# Patient Record
Sex: Female | Born: 1970
Health system: Southern US, Community
[De-identification: ages and names within clinical notes are randomized; demographics above are authoritative.]

## PROBLEM LIST (undated history)

## (undated) DIAGNOSIS — N186 End stage renal disease: Secondary | ICD-10-CM

## (undated) DIAGNOSIS — H409 Unspecified glaucoma: Secondary | ICD-10-CM

## (undated) DIAGNOSIS — J449 Chronic obstructive pulmonary disease, unspecified: Secondary | ICD-10-CM

## (undated) DIAGNOSIS — I5030 Unspecified diastolic (congestive) heart failure: Secondary | ICD-10-CM

## (undated) DIAGNOSIS — IMO0001 Reserved for inherently not codable concepts without codable children: Secondary | ICD-10-CM

## (undated) DIAGNOSIS — K529 Noninfective gastroenteritis and colitis, unspecified: Secondary | ICD-10-CM

## (undated) DIAGNOSIS — K810 Acute cholecystitis: Secondary | ICD-10-CM

## (undated) DIAGNOSIS — F32A Depression, unspecified: Secondary | ICD-10-CM

## (undated) DIAGNOSIS — I10 Essential (primary) hypertension: Secondary | ICD-10-CM

## (undated) DIAGNOSIS — Z8701 Personal history of pneumonia (recurrent): Secondary | ICD-10-CM

## (undated) DIAGNOSIS — H269 Unspecified cataract: Secondary | ICD-10-CM

## (undated) DIAGNOSIS — F419 Anxiety disorder, unspecified: Secondary | ICD-10-CM

## (undated) DIAGNOSIS — Z9289 Personal history of other medical treatment: Secondary | ICD-10-CM

## (undated) DIAGNOSIS — E11319 Type 2 diabetes mellitus with unspecified diabetic retinopathy without macular edema: Secondary | ICD-10-CM

## (undated) DIAGNOSIS — K219 Gastro-esophageal reflux disease without esophagitis: Secondary | ICD-10-CM

## (undated) DIAGNOSIS — Z794 Long term (current) use of insulin: Secondary | ICD-10-CM

## (undated) DIAGNOSIS — F329 Major depressive disorder, single episode, unspecified: Secondary | ICD-10-CM

## (undated) DIAGNOSIS — G629 Polyneuropathy, unspecified: Secondary | ICD-10-CM

## (undated) DIAGNOSIS — H5440 Blindness, one eye, unspecified eye: Secondary | ICD-10-CM

## (undated) DIAGNOSIS — D219 Benign neoplasm of connective and other soft tissue, unspecified: Secondary | ICD-10-CM

## (undated) DIAGNOSIS — R109 Unspecified abdominal pain: Secondary | ICD-10-CM

## (undated) DIAGNOSIS — J45909 Unspecified asthma, uncomplicated: Secondary | ICD-10-CM

## (undated) DIAGNOSIS — G8929 Other chronic pain: Secondary | ICD-10-CM

## (undated) DIAGNOSIS — K7400 Hepatic fibrosis, unspecified: Secondary | ICD-10-CM

## (undated) DIAGNOSIS — J40 Bronchitis, not specified as acute or chronic: Secondary | ICD-10-CM

## (undated) DIAGNOSIS — D638 Anemia in other chronic diseases classified elsewhere: Secondary | ICD-10-CM

## (undated) DIAGNOSIS — Z992 Dependence on renal dialysis: Secondary | ICD-10-CM

## (undated) DIAGNOSIS — E11621 Type 2 diabetes mellitus with foot ulcer: Secondary | ICD-10-CM

## (undated) DIAGNOSIS — K760 Fatty (change of) liver, not elsewhere classified: Secondary | ICD-10-CM

## (undated) DIAGNOSIS — M869 Osteomyelitis, unspecified: Secondary | ICD-10-CM

## (undated) DIAGNOSIS — L97509 Non-pressure chronic ulcer of other part of unspecified foot with unspecified severity: Secondary | ICD-10-CM

## (undated) DIAGNOSIS — E785 Hyperlipidemia, unspecified: Secondary | ICD-10-CM

## (undated) HISTORY — DX: Non-pressure chronic ulcer of other part of unspecified foot with unspecified severity: L97.509

## (undated) HISTORY — DX: Personal history of pneumonia (recurrent): Z87.01

## (undated) HISTORY — DX: Hepatic fibrosis, unspecified: K74.00

## (undated) HISTORY — DX: Unspecified diastolic (congestive) heart failure: I50.30

## (undated) HISTORY — PX: TUBAL LIGATION: SHX77

## (undated) HISTORY — PX: CATARACT EXTRACTION W/ INTRAOCULAR LENS IMPLANT: SHX1309

## (undated) HISTORY — DX: Essential (primary) hypertension: I10

## (undated) HISTORY — PX: EYE SURGERY: SHX253

## (undated) HISTORY — DX: Type 2 diabetes mellitus with foot ulcer: E11.621

## (undated) HISTORY — DX: Dependence on renal dialysis: N18.6

## (undated) HISTORY — DX: End stage renal disease: Z99.2

---

## 2004-06-18 ENCOUNTER — Emergency Department (HOSPITAL_COMMUNITY): Admission: EM | Admit: 2004-06-18 | Discharge: 2004-06-18 | Payer: Self-pay | Admitting: Emergency Medicine

## 2004-06-20 ENCOUNTER — Emergency Department (HOSPITAL_COMMUNITY): Admission: EM | Admit: 2004-06-20 | Discharge: 2004-06-20 | Payer: Self-pay | Admitting: Emergency Medicine

## 2005-04-23 ENCOUNTER — Emergency Department (HOSPITAL_COMMUNITY): Admission: EM | Admit: 2005-04-23 | Discharge: 2005-04-23 | Payer: Self-pay | Admitting: Emergency Medicine

## 2005-04-23 ENCOUNTER — Emergency Department (HOSPITAL_COMMUNITY): Admission: EM | Admit: 2005-04-23 | Discharge: 2005-04-23 | Payer: Self-pay | Admitting: Family Medicine

## 2006-10-15 ENCOUNTER — Other Ambulatory Visit: Admission: RE | Admit: 2006-10-15 | Discharge: 2006-10-15 | Payer: Self-pay | Admitting: Unknown Physician Specialty

## 2006-10-15 ENCOUNTER — Encounter (INDEPENDENT_AMBULATORY_CARE_PROVIDER_SITE_OTHER): Payer: Self-pay | Admitting: Specialist

## 2006-10-16 ENCOUNTER — Encounter (INDEPENDENT_AMBULATORY_CARE_PROVIDER_SITE_OTHER): Payer: Self-pay | Admitting: Specialist

## 2007-11-02 ENCOUNTER — Emergency Department (HOSPITAL_COMMUNITY): Admission: EM | Admit: 2007-11-02 | Discharge: 2007-11-02 | Payer: Self-pay | Admitting: Emergency Medicine

## 2010-12-24 ENCOUNTER — Inpatient Hospital Stay (INDEPENDENT_AMBULATORY_CARE_PROVIDER_SITE_OTHER)
Admission: RE | Admit: 2010-12-24 | Discharge: 2010-12-24 | Disposition: A | Discharge status: Home / Self Care | Payer: Self-pay | Source: Ambulatory Visit | Attending: Emergency Medicine | Admitting: Emergency Medicine

## 2010-12-24 DIAGNOSIS — N760 Acute vaginitis: Secondary | ICD-10-CM

## 2010-12-24 LAB — WET PREP, GENITAL
Trich, Wet Prep: NONE SEEN
Yeast Wet Prep HPF POC: NONE SEEN

## 2010-12-24 LAB — POCT URINALYSIS DIP (DEVICE)
Bilirubin Urine: NEGATIVE
Ketones, ur: NEGATIVE mg/dL
Protein, ur: NEGATIVE mg/dL
Specific Gravity, Urine: <=1.005 (ref 1.005–1.030)
pH: 5.5 (ref 5.0–8.0)

## 2010-12-26 LAB — GC/CHLAMYDIA PROBE AMP, GENITAL
Chlamydia, DNA Probe: NEGATIVE
GC Probe Amp, Genital: NEGATIVE

## 2011-04-24 LAB — WOUND CULTURE

## 2011-09-17 ENCOUNTER — Encounter (HOSPITAL_COMMUNITY): Payer: Self-pay | Admitting: Emergency Medicine

## 2011-09-17 ENCOUNTER — Emergency Department (HOSPITAL_COMMUNITY)
Admission: EM | Admit: 2011-09-17 | Discharge: 2011-09-17 | Disposition: A | Discharge status: Home / Self Care | Payer: Self-pay | Attending: Emergency Medicine | Admitting: Emergency Medicine

## 2011-09-17 DIAGNOSIS — IMO0002 Reserved for concepts with insufficient information to code with codable children: Secondary | ICD-10-CM | POA: Insufficient documentation

## 2011-09-17 DIAGNOSIS — M5380 Other specified dorsopathies, site unspecified: Secondary | ICD-10-CM | POA: Insufficient documentation

## 2011-09-17 DIAGNOSIS — F17200 Nicotine dependence, unspecified, uncomplicated: Secondary | ICD-10-CM | POA: Insufficient documentation

## 2011-09-17 DIAGNOSIS — M549 Dorsalgia, unspecified: Secondary | ICD-10-CM | POA: Insufficient documentation

## 2011-09-17 DIAGNOSIS — M62830 Muscle spasm of back: Secondary | ICD-10-CM

## 2011-09-17 DIAGNOSIS — E119 Type 2 diabetes mellitus without complications: Secondary | ICD-10-CM | POA: Insufficient documentation

## 2011-09-17 MED ORDER — CYCLOBENZAPRINE HCL 10 MG PO TABS: 10.0000 mg | ORAL_TABLET | Freq: Two times a day (BID) | ORAL | Status: AC | PRN

## 2011-09-17 MED ORDER — IBUPROFEN 600 MG PO TABS: 600.0000 mg | ORAL_TABLET | Freq: Four times a day (QID) | ORAL | Status: AC | PRN

## 2011-09-17 MED ORDER — IBUPROFEN 800 MG PO TABS
800.0000 mg | ORAL_TABLET | Freq: Once | ORAL | Status: AC
  Administered 2011-09-17: 800 mg via ORAL
  Filled 2011-09-17: qty 1

## 2011-09-17 NOTE — Discharge Instructions (Signed)
Take your next dose of ibuprofen tomorrow morning.  Use the muscle relaxer as directed.   Avoid lifting,  Bending,  Twisting or any other activity that worsens your pain over the next week.  Apply an  icepack  to your lower back for 10-15 minutes every 2 hours for the next 2 days.  You should get rechecked if your symptoms are not better over the next 5 days,  Or you develop increased pain,  Weakness in your leg(s) or loss of bladder or bowel function - these are symptoms of a worse injury.

## 2011-09-17 NOTE — ED Notes (Signed)
Pt c/o lower back pain x 3 days.
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
## 2011-12-29 ENCOUNTER — Emergency Department (HOSPITAL_COMMUNITY): Payer: Self-pay

## 2011-12-29 ENCOUNTER — Encounter (HOSPITAL_COMMUNITY): Payer: Self-pay | Admitting: Emergency Medicine

## 2011-12-29 ENCOUNTER — Emergency Department (HOSPITAL_COMMUNITY)
Admission: EM | Admit: 2011-12-29 | Discharge: 2011-12-29 | Disposition: A | Discharge status: Home / Self Care | Payer: Self-pay | Attending: Emergency Medicine | Admitting: Emergency Medicine

## 2011-12-29 DIAGNOSIS — Z79899 Other long term (current) drug therapy: Secondary | ICD-10-CM | POA: Insufficient documentation

## 2011-12-29 DIAGNOSIS — E1169 Type 2 diabetes mellitus with other specified complication: Secondary | ICD-10-CM | POA: Insufficient documentation

## 2011-12-29 DIAGNOSIS — R739 Hyperglycemia, unspecified: Secondary | ICD-10-CM

## 2011-12-29 DIAGNOSIS — J0100 Acute maxillary sinusitis, unspecified: Secondary | ICD-10-CM | POA: Insufficient documentation

## 2011-12-29 LAB — DIFFERENTIAL
Basophils Absolute: 0 10*3/uL (ref 0.0–0.1)
Basophils Relative: 0 % (ref 0–1)
Eosinophils Relative: 1 % (ref 0–5)
Lymphocytes Relative: 23 % (ref 12–46)
Monocytes Absolute: 0.5 10*3/uL (ref 0.1–1.0)

## 2011-12-29 LAB — CBC
HCT: 37.5 % (ref 36.0–46.0)
MCH: 31 pg (ref 26.0–34.0)
MCHC: 34.9 g/dL (ref 30.0–36.0)
MCV: 88.9 fL (ref 78.0–100.0)
RDW: 12.2 % (ref 11.5–15.5)

## 2011-12-29 LAB — URINE MICROSCOPIC-ADD ON

## 2011-12-29 LAB — GLUCOSE, CAPILLARY: Glucose-Capillary: 241 mg/dL — ABNORMAL HIGH (ref 70–99)

## 2011-12-29 LAB — BASIC METABOLIC PANEL
CO2: 27 mEq/L (ref 19–32)
Calcium: 9.2 mg/dL (ref 8.4–10.5)
Creatinine, Ser: 0.58 mg/dL (ref 0.50–1.10)

## 2011-12-29 LAB — URINALYSIS, ROUTINE W REFLEX MICROSCOPIC
Glucose, UA: >1000 mg/dL — AB
Ketones, ur: NEGATIVE mg/dL
Leukocytes, UA: NEGATIVE
Protein, ur: 30 mg/dL — AB

## 2011-12-29 MED ORDER — SODIUM CHLORIDE 0.9 % IV SOLN: INTRAVENOUS | Status: DC

## 2011-12-29 MED ORDER — ONDANSETRON HCL 4 MG/2ML IJ SOLN
4.0000 mg | Freq: Once | INTRAMUSCULAR | Status: AC
  Administered 2011-12-29: 4 mg via INTRAVENOUS
  Filled 2011-12-29: qty 2

## 2011-12-29 MED ORDER — SODIUM CHLORIDE 0.9 % IV BOLUS (SEPSIS)
1000.0000 mL | Freq: Once | INTRAVENOUS | Status: AC
  Administered 2011-12-29: 1000 mL via INTRAVENOUS

## 2011-12-29 MED ORDER — METFORMIN HCL 500 MG PO TABS
1000.0000 mg | ORAL_TABLET | Freq: Once | ORAL | Status: AC
  Administered 2011-12-29: 1000 mg via ORAL
  Filled 2011-12-29: qty 2

## 2011-12-29 MED ORDER — INSULIN ASPART 100 UNIT/ML ~~LOC~~ SOLN
10.0000 [IU] | Freq: Once | SUBCUTANEOUS | Status: AC
  Administered 2011-12-29: 10 [IU] via SUBCUTANEOUS

## 2011-12-29 MED ORDER — ACETAMINOPHEN 500 MG PO TABS
1000.0000 mg | ORAL_TABLET | Freq: Once | ORAL | Status: AC
  Administered 2011-12-29: 1000 mg via ORAL
  Filled 2011-12-29: qty 2

## 2011-12-29 MED ORDER — INSULIN REGULAR HUMAN 100 UNIT/ML IJ SOLN: 10.0000 [IU] | Freq: Once | INTRAMUSCULAR | Status: DC

## 2011-12-29 MED ORDER — AMOXICILLIN-POT CLAVULANATE 875-125 MG PO TABS
1.0000 | ORAL_TABLET | Freq: Once | ORAL | Status: AC
  Administered 2011-12-29: 1 via ORAL
  Filled 2011-12-29: qty 1

## 2011-12-29 MED ORDER — IBUPROFEN 600 MG PO TABS: 600.0000 mg | ORAL_TABLET | Freq: Four times a day (QID) | ORAL | Status: AC | PRN

## 2011-12-29 MED ORDER — AMOXICILLIN-POT CLAVULANATE 875-125 MG PO TABS: 1.0000 | ORAL_TABLET | Freq: Two times a day (BID) | ORAL | Status: AC

## 2011-12-29 NOTE — Discharge Instructions (Signed)
Hyperglycemia Hyperglycemia occurs when the glucose (sugar) in your blood is too high. Hyperglycemia can happen for many reasons, but it most often happens to people who do not know they have diabetes or are not managing their diabetes properly.  CAUSES  Whether you have diabetes or not, there are other causes of hyperglycemia. Hyperglycemia can occur when you have diabetes, but it can also occur in other situations that you might not be as aware of, such as: Diabetes  If you have diabetes and are having problems controlling your blood glucose, hyperglycemia could occur because of some of the following reasons:   Not following your meal plan.   Not taking your diabetes medications or not taking it properly.   Exercising less or doing less activity than you normally do.   Being sick.  Pre-diabetes  This cannot be ignored. Before people develop Type 2 diabetes, they almost always have "pre-diabetes." This is when your blood glucose levels are higher than normal, but not yet high enough to be diagnosed as diabetes. Research has shown that some long-term damage to the body, especially the heart and circulatory system, may already be occurring during pre-diabetes. If you take action to manage your blood glucose when you have pre-diabetes, you may delay or prevent Type 2 diabetes from developing.  Stress  If you have diabetes, you may be "diet" controlled or on oral medications or insulin to control your diabetes. However, you may find that your blood glucose is higher than usual in the hospital whether you have diabetes or not. This is often referred to as "stress hyperglycemia." Stress can elevate your blood glucose. This happens because of hormones put out by the body during times of stress. If stress has been the cause of your high blood glucose, it can be followed regularly by your caregiver. That way he/she can make sure your hyperglycemia does not continue to get worse or progress to diabetes.    Steroids  Steroids are medications that act on the infection fighting system (immune system) to block inflammation or infection. One side effect can be a rise in blood glucose. Most people can produce enough extra insulin to allow for this rise, but for those who cannot, steroids make blood glucose levels go even higher. It is not unusual for steroid treatments to "uncover" diabetes that is developing. It is not always possible to determine if the hyperglycemia will go away after the steroids are stopped. A special blood test called an A1c is sometimes done to determine if your blood glucose was elevated before the steroids were started.  SYMPTOMS  Thirsty.   Frequent urination.   Dry mouth.   Blurred vision.   Tired or fatigue.   Weakness.   Sleepy.   Tingling in feet or leg.  DIAGNOSIS  Diagnosis is made by monitoring blood glucose in one or all of the following ways:  A1c test. This is a chemical found in your blood.   Fingerstick blood glucose monitoring.   Laboratory results.  TREATMENT  First, knowing the cause of the hyperglycemia is important before the hyperglycemia can be treated. Treatment may include, but is not be limited to:  Education.   Change or adjustment in medications.   Change or adjustment in meal plan.   Treatment for an illness, infection, etc.   More frequent blood glucose monitoring.   Change in exercise plan.   Decreasing or stopping steroids.   Lifestyle changes.  HOME CARE INSTRUCTIONS   Test your blood glucose  as directed.   Exercise regularly. Your caregiver will give you instructions about exercise. Pre-diabetes or diabetes which comes on with stress is helped by exercising.   Eat wholesome, balanced meals. Eat often and at regular, fixed times. Your caregiver or nutritionist will give you a meal plan to guide your sugar intake.   Being at an ideal weight is important. If needed, losing as little as 10 to 15 pounds may help  improve blood glucose levels.  SEEK MEDICAL CARE IF:   You have questions about medicine, activity, or diet.   You continue to have symptoms (problems such as increased thirst, urination, or weight gain).  SEEK IMMEDIATE MEDICAL CARE IF:   You are vomiting or have diarrhea.   Your breath smells fruity.   You are breathing faster or slower.   You are very sleepy or incoherent.   You have numbness, tingling, or pain in your feet or hands.   You have chest pain.   Your symptoms get worse even though you have been following your caregiver's orders.   If you have any other questions or concerns.  Document Released: 01/09/2001 Document Revised: 07/05/2011 Document Reviewed: 03/07/2009 [facility] Patient Information 2012 [place].Sinusitis Sinuses are air pockets within the bones of your face. The growth of bacteria within a sinus leads to infection. The infection prevents the sinuses from draining. This infection is called sinusitis. SYMPTOMS  There will be different areas of pain depending on which sinuses have become infected.  The maxillary sinuses often produce pain beneath the eyes.   Frontal sinusitis may cause pain in the middle of the forehead and above the eyes.  Other problems (symptoms) include:  Toothaches.   Colored, pus-like (purulent) drainage from the nose.   Swelling, warmth, and tenderness over the sinus areas may be signs of infection.  TREATMENT  Sinusitis is most often determined by an exam.X-rays may be taken. If x-rays have been taken, make sure you obtain your results or find out how you are to obtain them. Your caregiver may give you medications (antibiotics). These are medications that will help kill the bacteria causing the infection. You may also be given a medication (decongestant) that helps to reduce sinus swelling.  HOME CARE INSTRUCTIONS   Only take over-the-counter or prescription medicines for pain, discomfort, or fever as directed by your  caregiver.   Drink extra fluids. Fluids help thin the mucus so your sinuses can drain more easily.   Applying either moist heat or ice packs to the sinus areas may help relieve discomfort.   Use saline nasal sprays to help moisten your sinuses. The sprays can be found at your local drugstore.  SEEK IMMEDIATE MEDICAL CARE IF:  You have a fever.   You have increasing pain, severe headaches, or toothache.   You have nausea, vomiting, or drowsiness.   You develop unusual swelling around the face or trouble seeing.  MAKE SURE YOU:   Understand these instructions.   Will watch your condition.   Will get help right away if you are not doing well or get worse.  Document Released: 07/16/2005 Document Revised: 07/05/2011 Document Reviewed: 02/12/2007 [facility] Patient Information 2012 [place].

## 2011-12-29 NOTE — ED Notes (Signed)
C/o being sick with cough, headache for two weeks,

## 2011-12-29 NOTE — ED Notes (Signed)
Pt c/o headache since yesterday and c/o productive cough x one week.

## 2011-12-30 NOTE — ED Provider Notes (Signed)
History     CSN: [id number]  Arrival date & time 12/29/11  1058   First MD Initiated Contact with Patient 12/29/11 1059      Chief Complaint  Patient presents with  . Headache  . Cough    (Consider location/radiation/quality/duration/timing/severity/associated sxs/prior treatment) HPI Comments: Kara Knapp presents with complaint of 1 week history of uri symptoms including nasal congestion, thick nasal discharge associated with post nasal drip and productive cough.  She reports increasing pain across her right cheek and behind her right eye which is worse with palpation,  She denies facial swelling or redness and denies any pain with movement of her eye and no visual changes.  She denies fevers.  She has been using nyquill containing pseudoephedrine  And using robitussin without relief.  She denies shortness of breath,  Chest pain,  No nausea, emesis or abdominal pain.  She has not checked her blood glucose this week,  But has been taking her medicines,  However, did not take her metformin this morning. She denies polyuria and polydipsia.  The history is provided by the patient and the spouse.    Past Medical History  Diagnosis Date  . Diabetes mellitus     Past Surgical History  Procedure Date  . Cesarean section   . Tubal ligation     History reviewed. No pertinent family history.  History  Substance Use Topics  . Smoking status: Current Everyday Smoker    Types: Cigarettes  . Smokeless tobacco: Not on file  . Alcohol Use: No    OB History    Grav Para Term Preterm Abortions TAB SAB Ect Mult Living                  Review of Systems  Constitutional: Negative for fever.  HENT: Positive for congestion, rhinorrhea and postnasal drip. Negative for ear pain, sore throat, facial swelling, neck pain and neck stiffness.   Eyes: Negative.   Respiratory: Positive for cough. Negative for chest tightness, shortness of breath and wheezing.   Cardiovascular: Negative for  chest pain.  Gastrointestinal: Negative for nausea and abdominal pain.  Genitourinary: Negative.   Musculoskeletal: Negative for joint swelling and arthralgias.  Skin: Negative.  Negative for rash and wound.  Neurological: Positive for headaches. Negative for dizziness, weakness, light-headedness and numbness.  Hematological: Negative.   Psychiatric/Behavioral: Negative.     Allergies  Review of patient's allergies indicates no known allergies.  Home Medications   Current Outpatient Rx  Name Route Sig Dispense Refill  . DIPHENHYDRAMINE HCL 25 MG PO TABS Oral Take 25 mg by mouth every 6 (six) hours as needed. For itching    . GABAPENTIN 300 MG PO CAPS Oral Take 600 mg by mouth 2 (two) times daily.    . GUAIFENESIN 100 MG/5ML PO SOLN Oral Take 10 mLs by mouth every 4 (four) hours as needed. For cough    . INSULIN GLARGINE 100 UNIT/ML [place] SOLN Subcutaneous Inject 20 Units into the skin at bedtime.     Marland Kitchen METFORMIN HCL 1000 MG PO TABS Oral Take by mouth 2 (two) times daily with a meal.    . PSEUDOEPH-DOXYLAMINE-DM-APAP 60-7.12-26-998 MG/30ML PO LIQD Oral Take 30 mLs by mouth at bedtime.    . AMOXICILLIN-POT CLAVULANATE 875-125 MG PO TABS Oral Take 1 tablet by mouth every 12 (twelve) hours. 14 tablet 0  . IBUPROFEN 600 MG PO TABS Oral Take 1 tablet (600 mg total) by mouth every 6 (six) hours as needed  for pain. 30 tablet 0    BP 148/94  Pulse 76  Temp(Src) 97.9 F (36.6 C) (Oral)  Resp 20  Ht 5' 3"  (1.6 m)  Wt 163 lb (73.936 kg)  BMI 28.87 kg/m2  SpO2 97%  LMP 11/28/2011  Physical Exam  Nursing note and vitals reviewed. Constitutional: She appears well-developed and well-nourished.  HENT:  Head: Normocephalic and atraumatic.  Right Ear: Tympanic membrane and ear canal normal. No mastoid tenderness.  Left Ear: Tympanic membrane and ear canal normal. No mastoid tenderness.  Nose: Mucosal edema and rhinorrhea present. Right sinus exhibits maxillary sinus tenderness.  Mouth/Throat:  No posterior oropharyngeal edema or posterior oropharyngeal erythema.       Purulent post nasal drip present.  Also with purulent drainage bilateral nares.  Eyes: Conjunctivae and EOM are normal. Pupils are equal, round, and reactive to light.  Neck: Normal range of motion.  Cardiovascular: Normal rate, regular rhythm, normal heart sounds and intact distal pulses.   Pulmonary/Chest: Effort normal and breath sounds normal. She has no wheezes.  Abdominal: Soft. Bowel sounds are normal. There is no tenderness.  Musculoskeletal: Normal range of motion.  Neurological: She is alert. She has normal strength. No sensory deficit.  Skin: Skin is warm and dry.  Psychiatric: She has a normal mood and affect.    ED Course  Procedures (including critical care time)  Labs Reviewed  GLUCOSE, CAPILLARY - Abnormal; Notable for the following:    Glucose-Capillary 410 (*)    All other components within normal limits  BASIC METABOLIC PANEL - Abnormal; Notable for the following:    Glucose, Bld 376 (*)    All other components within normal limits  URINALYSIS, ROUTINE W REFLEX MICROSCOPIC - Abnormal; Notable for the following:    APPearance CLOUDY (*)    Glucose, UA >1000 (*)    Hgb urine dipstick LARGE (*)    Protein, ur 30 (*)    All other components within normal limits  URINE MICROSCOPIC-ADD ON - Abnormal; Notable for the following:    Squamous Epithelial / LPF MANY (*)    Bacteria, UA MANY (*)    All other components within normal limits  GLUCOSE, CAPILLARY - Abnormal; Notable for the following:    Glucose-Capillary 241 (*)    All other components within normal limits  CBC  DIFFERENTIAL  URINE CULTURE   Dg Chest 2 View  12/29/2011  *RADIOLOGY REPORT*  Clinical Data: Cough and congestion  CHEST - 2 VIEW  Comparison: None  Findings: Heart size appears normal.  There is no pleural effusion or edema.  Coarsened interstitial markings are noted bilaterally.   No airspace consolidation.  IMPRESSION:   1.  No acute cardiopulmonary abnormalities. 2.  Chronic interstitial coarsening.  Original Report Authenticated By: Angelita Ingles, M.D.     1. Sinusitis, acute maxillary   2. Hyperglycemia without ketosis    Pt given IV fluids,  Along with her am metformin dose 1gram which she had not taken, also given insulin aspart 10 units sq - recheck cbg prior to dc 241.  Pt felt improved.  Given her first augmentin tab prior to dc home.   MDM  Classic sx suggestive of acute sinusitis in setting of hyperglycemia.  Pt also possibly with uti, but large # of squamous cells,  Possibly contamination,  No urinary sx.  However,  Prescribed augmentin which should cover for possible uti in addition to acute sinusitis,  Urine cx sent.  Pt encouraged to f/u with  pcp this week for a recheck,  And to keep close watch on her blood glucose levels at home.        Evalee Jefferson, [place] 12/30/11 2125

## 2011-12-31 NOTE — ED Provider Notes (Signed)
Medical screening examination/treatment/procedure(s) were performed by non-physician practitioner and as supervising physician I was immediately available for consultation/collaboration.   Trisha Mangle, MD 12/31/11 Einar Crow

## 2012-01-01 LAB — URINE CULTURE: Culture  Setup Time: 201306011933

## 2012-01-02 NOTE — ED Notes (Signed)
+   urine Patient treated with Augmentin-sensitive to same-chart appended per protocol MD.

## 2012-02-08 ENCOUNTER — Emergency Department (HOSPITAL_COMMUNITY)
Admission: EM | Admit: 2012-02-08 | Discharge: 2012-02-08 | Disposition: A | Discharge status: Home / Self Care | Payer: Self-pay | Attending: Emergency Medicine | Admitting: Emergency Medicine

## 2012-02-08 ENCOUNTER — Encounter (HOSPITAL_COMMUNITY): Payer: Self-pay | Admitting: *Deleted

## 2012-02-08 DIAGNOSIS — F17200 Nicotine dependence, unspecified, uncomplicated: Secondary | ICD-10-CM | POA: Insufficient documentation

## 2012-02-08 DIAGNOSIS — E1142 Type 2 diabetes mellitus with diabetic polyneuropathy: Secondary | ICD-10-CM | POA: Insufficient documentation

## 2012-02-08 DIAGNOSIS — E1149 Type 2 diabetes mellitus with other diabetic neurological complication: Secondary | ICD-10-CM | POA: Insufficient documentation

## 2012-02-08 DIAGNOSIS — M19072 Primary osteoarthritis, left ankle and foot: Secondary | ICD-10-CM

## 2012-02-08 DIAGNOSIS — M19079 Primary osteoarthritis, unspecified ankle and foot: Secondary | ICD-10-CM | POA: Insufficient documentation

## 2012-02-08 DIAGNOSIS — Z794 Long term (current) use of insulin: Secondary | ICD-10-CM | POA: Insufficient documentation

## 2012-02-08 HISTORY — DX: Polyneuropathy, unspecified: G62.9

## 2012-02-08 LAB — CBC WITH DIFFERENTIAL/PLATELET
Basophils Absolute: 0.1 10*3/uL (ref 0.0–0.1)
HCT: 36.7 % (ref 36.0–46.0)
Lymphocytes Relative: 23 % (ref 12–46)
Neutro Abs: 6 10*3/uL (ref 1.7–7.7)
Platelets: 240 10*3/uL (ref 150–400)
RDW: 12.4 % (ref 11.5–15.5)
WBC: 8.7 10*3/uL (ref 4.0–10.5)

## 2012-02-08 LAB — SEDIMENTATION RATE: Sed Rate: 38 mm/hr — ABNORMAL HIGH (ref 0–22)

## 2012-02-08 MED ORDER — IBUPROFEN 400 MG PO TABS: 400.0000 mg | ORAL_TABLET | Freq: Four times a day (QID) | ORAL | Status: AC | PRN

## 2012-02-08 MED ORDER — IBUPROFEN 400 MG PO TABS: 400.0000 mg | ORAL_TABLET | Freq: Four times a day (QID) | ORAL | Status: DC | PRN

## 2012-02-08 MED ORDER — HYDROCODONE-ACETAMINOPHEN 5-325 MG PO TABS: 1.0000 | ORAL_TABLET | ORAL | Status: DC | PRN

## 2012-02-08 MED ORDER — HYDROCODONE-ACETAMINOPHEN 5-325 MG PO TABS: 1.0000 | ORAL_TABLET | ORAL | Status: AC | PRN

## 2012-02-08 MED ORDER — HYDROCODONE-IBUPROFEN 7.5-200 MG PO TABS: 1.0000 | ORAL_TABLET | Freq: Four times a day (QID) | ORAL | Status: DC | PRN

## 2012-02-08 MED ORDER — HYDROCODONE-ACETAMINOPHEN 5-325 MG PO TABS
1.0000 | ORAL_TABLET | Freq: Once | ORAL | Status: AC
  Administered 2012-02-08: 1 via ORAL
  Filled 2012-02-08: qty 1

## 2012-02-08 NOTE — ED Notes (Signed)
Pt c/o L foot pain; Pt has chronic feet pain but pain increased on L foot two days ago. Hurts on the top of foot increases with movement. No injury per pt

## 2012-02-08 NOTE — ED Provider Notes (Addendum)
History  This chart was scribed for Kara Latina III, MD by Lovena Le Day. This patient was seen in room APA15/APA15 and the patient's care was started at 0626.  CSN: [id number]  Arrival date & time 02/08/12  9211   None     Chief Complaint  Patient presents with  . Foot Pain    Left    Patient is a 41 y.o. female presenting with lower extremity pain. The history is provided by the patient. No language interpreter was used.  Foot Pain Pertinent negatives include no chest pain and no abdominal pain.     Kara Knapp is a 41 y.o. female with a h/o chronic diabetic  neuropathy who presents to the Emergency Department complaining of constant left foot pain on dorsum of her foot for two days. She states her pain became much worse last night and her pain increases with movement. She denies any recent injuries leading to her foot pain. She regularly takes Gabapentin for her neuropathy but denies improvement in the foot pain. She also denies arthritis, fever, sore throat. She is a current everyday smoker with a chronic smoker's cough.   Past Medical History  Diagnosis Date  . Diabetes mellitus   . Neuropathy     Past Surgical History  Procedure Date  . Cesarean section   . Tubal ligation     History reviewed. No pertinent family history.  History  Substance Use Topics  . Smoking status: Current Everyday Smoker -- 1.0 packs/day    Types: Cigarettes  . Smokeless tobacco: Not on file  . Alcohol Use: No    No OB history provided.  Review of Systems  Constitutional: Negative for chills and fatigue.  HENT: Negative for rhinorrhea.   Respiratory: Positive for cough (Chronic smokers cough). Negative for chest tightness.   Cardiovascular: Negative for chest pain.  Gastrointestinal: Negative for abdominal pain and abdominal distention.  Musculoskeletal:       Left foot pain on dorsum of her foot  Skin: Negative for color change.  Neurological: Negative for syncope and facial  asymmetry.  All other systems reviewed and are negative.    Allergies  Review of patient's allergies indicates no known allergies.  Home Medications   Current Outpatient Rx  Name Route Sig Dispense Refill  . GLIPIZIDE 10 MG PO TABS Oral Take 10 mg by mouth 2 (two) times daily before a meal.    . DIPHENHYDRAMINE HCL 25 MG PO TABS Oral Take 25 mg by mouth every 6 (six) hours as needed. For itching    . GABAPENTIN 300 MG PO CAPS Oral Take 600 mg by mouth 2 (two) times daily.    . GUAIFENESIN 100 MG/5ML PO SOLN Oral Take 10 mLs by mouth every 4 (four) hours as needed. For cough    . INSULIN GLARGINE 100 UNIT/ML [place] SOLN Subcutaneous Inject 20 Units into the skin at bedtime.     Marland Kitchen METFORMIN HCL 1000 MG PO TABS Oral Take by mouth 2 (two) times daily with a meal.    . PSEUDOEPH-DOXYLAMINE-DM-APAP 60-7.12-26-998 MG/30ML PO LIQD Oral Take 30 mLs by mouth at bedtime.      Triage Vitals: BP 134/88  Pulse 77  Temp 97.7 F (36.5 C) (Oral)  Resp 20  Ht 5' 3"  (1.6 m)  Wt 172 lb (78.019 kg)  BMI 30.47 kg/m2  SpO2 98%  LMP 02/02/2012  Physical Exam  Nursing note and vitals reviewed. Constitutional: She is oriented to person, place, and time. She appears  well-developed and well-nourished. No distress.  HENT:  Head: Normocephalic and atraumatic.  Eyes: EOM are normal.  Neck: Neck supple. No tracheal deviation present.  Cardiovascular: Normal rate.   Pulmonary/Chest: Effort normal. No respiratory distress.  Musculoskeletal: Normal range of motion.       tenderness over dorsum of left foot, no sign of tendonitis   Neurological: She is alert and oriented to person, place, and time.  Skin: Skin is warm and dry.  Psychiatric: She has a normal mood and affect. Her behavior is normal.      ED Course  Procedures (including critical care time)  DIAGNOSTIC STUDIES: Oxygen Saturation is 98% on room air, normal by my interpretation.    COORDINATION OF CARE: At 7:33 AM Discussed treatment  plan with patient which includes blood work to check for gout and pain medications. Patient agrees. Discussed smoking cessation with pt.   Labs Reviewed  CBC WITH DIFFERENTIAL  SEDIMENTATION RATE  URIC ACID   8:44 AM  CBC and uric acid are normal.  30 minutes left to go on sed rate.  9:23 AM Sed rate is elevated at 38, a sign of inflammation.  Advised ibuprofen 400 mg tid, hydrocodone-acetaminophen for pain.   1. Arthritis of left foot      I personally performed the services described in this documentation, which was scribed in my presence. The recorded information has been reviewed and considered.  Katy Apo, M.D.     Kara Latina III, MD 02/08/12 [place], MD 02/08/12 [phone]

## 2012-04-16 ENCOUNTER — Encounter (HOSPITAL_COMMUNITY): Payer: Self-pay | Admitting: Emergency Medicine

## 2012-04-16 ENCOUNTER — Emergency Department (HOSPITAL_COMMUNITY)
Admission: EM | Admit: 2012-04-16 | Discharge: 2012-04-16 | Disposition: A | Discharge status: Home / Self Care | Payer: Self-pay | Attending: Emergency Medicine | Admitting: Emergency Medicine

## 2012-04-16 DIAGNOSIS — F17200 Nicotine dependence, unspecified, uncomplicated: Secondary | ICD-10-CM | POA: Insufficient documentation

## 2012-04-16 DIAGNOSIS — S335XXA Sprain of ligaments of lumbar spine, initial encounter: Secondary | ICD-10-CM | POA: Insufficient documentation

## 2012-04-16 DIAGNOSIS — X503XXA Overexertion from repetitive movements, initial encounter: Secondary | ICD-10-CM | POA: Insufficient documentation

## 2012-04-16 DIAGNOSIS — Z794 Long term (current) use of insulin: Secondary | ICD-10-CM | POA: Insufficient documentation

## 2012-04-16 DIAGNOSIS — S39012A Strain of muscle, fascia and tendon of lower back, initial encounter: Secondary | ICD-10-CM

## 2012-04-16 DIAGNOSIS — E119 Type 2 diabetes mellitus without complications: Secondary | ICD-10-CM | POA: Insufficient documentation

## 2012-04-16 MED ORDER — HYDROCODONE-ACETAMINOPHEN 5-325 MG PO TABS: ORAL_TABLET | ORAL | Status: DC

## 2012-04-16 MED ORDER — BACLOFEN 10 MG PO TABS: 10.0000 mg | ORAL_TABLET | Freq: Three times a day (TID) | ORAL | Status: AC

## 2012-04-16 NOTE — ED Notes (Signed)
Patient c/o lower back pain x 2 weeks that radiates to legs bilaterally intermittently. Denies urinary symptoms.

## 2012-04-16 NOTE — ED Provider Notes (Signed)
History     CSN: [id number]  Arrival date & time 04/16/12  1334   First MD Initiated Contact with Patient 04/16/12 1513      Chief Complaint  Patient presents with  . Back Pain    (Consider location/radiation/quality/duration/timing/severity/associated sxs/prior treatment) HPI Comments: Patient states she has had some problems with her back off and on from time to time. The patient states usually these pains will go away with conservative management. The patient states that at her work she does a lot of heavy lifting of pain and and large containers of food. She complains that over the last few days the pain is getting worse and particularly with certain positions. She request to be evaluated for this particular problem.  Patient is a 41 y.o. female presenting with back pain. The history is provided by the patient.  Back Pain  This is a new problem. The current episode started more than 1 week ago. The problem occurs daily. The problem has been gradually worsening. Associated symptoms include numbness. Pertinent negatives include no chest pain, no abdominal pain and no dysuria.    Past Medical History  Diagnosis Date  . Diabetes mellitus   . Neuropathy     Past Surgical History  Procedure Date  . Cesarean section   . Tubal ligation     No family history on file.  History  Substance Use Topics  . Smoking status: Current Every Day Smoker -- 1.0 packs/day    Types: Cigarettes  . Smokeless tobacco: Not on file  . Alcohol Use: No    OB History    Grav Para Term Preterm Abortions TAB SAB Ect Mult Living                  Review of Systems  Constitutional: Negative for activity change.       All ROS Neg except as noted in HPI  HENT: Negative for nosebleeds and neck pain.   Eyes: Negative for photophobia and discharge.  Respiratory: Negative for cough, shortness of breath and wheezing.   Cardiovascular: Negative for chest pain and palpitations.  Gastrointestinal:  Negative for abdominal pain and blood in stool.  Genitourinary: Positive for vaginal discharge. Negative for dysuria, frequency and hematuria.       Vaginal itching  Musculoskeletal: Positive for back pain. Negative for arthralgias.  Skin: Negative.   Neurological: Positive for numbness. Negative for dizziness, seizures and speech difficulty.  Psychiatric/Behavioral: Negative for hallucinations and confusion.    Allergies  Review of patient's allergies indicates no known allergies.  Home Medications   Current Outpatient Rx  Name Route Sig Dispense Refill  . ACETAMINOPHEN 500 MG PO TABS Oral Take 1,000 mg by mouth every 6 (six) hours as needed. For pain    . DIPHENHYDRAMINE HCL 25 MG PO TABS Oral Take 25 mg by mouth every 6 (six) hours as needed. For itching    . GABAPENTIN 300 MG PO CAPS Oral Take 600 mg by mouth 2 (two) times daily.    Marland Kitchen GLIPIZIDE 10 MG PO TABS Oral Take 10 mg by mouth 2 (two) times daily before a meal.    . GUAIFENESIN 100 MG/5ML PO SOLN Oral Take 10 mLs by mouth every 4 (four) hours as needed. For cough    . INSULIN GLARGINE 100 UNIT/ML [place] SOLN Subcutaneous Inject 20 Units into the skin at bedtime.     Marland Kitchen LISINOPRIL 5 MG PO TABS Oral Take 5 mg by mouth daily.    Marland Kitchen  METFORMIN HCL 1000 MG PO TABS Oral Take by mouth 2 (two) times daily with a meal.    . PSEUDOEPH-DOXYLAMINE-DM-APAP 60-7.12-26-998 MG/30ML PO LIQD Oral Take 30 mLs by mouth at bedtime.      BP 121/90  Pulse 86  Temp 98.2 F (36.8 C) (Oral)  Resp 18  Ht 5' 3"  (1.6 m)  Wt 170 lb (77.111 kg)  BMI 30.11 kg/m2  SpO2 98%  Physical Exam  Nursing note and vitals reviewed. Constitutional: She is oriented to person, place, and time. She appears well-developed and well-nourished.  Non-toxic appearance.  HENT:  Head: Normocephalic.  Right Ear: Tympanic membrane and external ear normal.  Left Ear: Tympanic membrane and external ear normal.  Eyes: EOM and lids are normal. Pupils are equal, round, and  reactive to light.  Neck: Normal range of motion. Neck supple. Carotid bruit is not present.  Cardiovascular: Normal rate, regular rhythm, normal heart sounds, intact distal pulses and normal pulses.   Pulmonary/Chest: Breath sounds normal. No respiratory distress.  Abdominal: Soft. Bowel sounds are normal. There is no tenderness. There is no guarding.  Musculoskeletal: Normal range of motion.       There is pain with range of motion of the lower lumbar area. There is paraspinal area tenderness and spasm at the lower lumbar region.  Lymphadenopathy:       Head (right side): No submandibular adenopathy present.       Head (left side): No submandibular adenopathy present.    She has no cervical adenopathy.  Neurological: She is alert and oriented to person, place, and time. She has normal strength. No cranial nerve deficit or sensory deficit.       Patient has some decrease in sensation to touch of the lower extremities. Patient states this is not new as she has a diabetic neuropathy. The motor strength of the lower extremities is symmetrical.  Skin: Skin is warm and dry.  Psychiatric: She has a normal mood and affect. Her speech is normal.    ED Course  Procedures (including critical care time)  Labs Reviewed - No data to display No results found.   No diagnosis found.    MDM  I have reviewed nursing notes, vital signs, and all appropriate lab and imaging results for this patient. Patient states that she does a lot of heavy lifting at her job. She has had problems over the last 2 weeks with increasing pain involving the lower back particularly with certain positions. No gross neurologic deficits appreciated at this time other than some decrease in sensation to touch of the feet. This is not new per the patient and she has a diabetic neuropathy. The plan at this time is to add baclofen and Norco to the patient's current medications. Patient is advised to see one of the orthopedic  specialist if this is not improving.       Lenox Ahr, [place] 04/16/12 [phone]

## 2012-04-16 NOTE — ED Notes (Signed)
Pt presents with lower back pain x 2 weeks. Pt denies known injury to back. Denies bladder and bowel incontinence.

## 2012-04-17 NOTE — ED Provider Notes (Signed)
Medical screening examination/treatment/procedure(s) were performed by non-physician practitioner and as supervising physician I was immediately available for consultation/collaboration.  Varney Biles, MD 04/17/12 [phone]

## 2012-05-03 ENCOUNTER — Encounter (HOSPITAL_COMMUNITY): Payer: Self-pay | Admitting: *Deleted

## 2012-05-03 ENCOUNTER — Emergency Department (HOSPITAL_COMMUNITY)
Admission: EM | Admit: 2012-05-03 | Discharge: 2012-05-03 | Disposition: A | Discharge status: Home / Self Care | Payer: Self-pay | Attending: Emergency Medicine | Admitting: Emergency Medicine

## 2012-05-03 DIAGNOSIS — M5416 Radiculopathy, lumbar region: Secondary | ICD-10-CM

## 2012-05-03 DIAGNOSIS — M545 Low back pain, unspecified: Secondary | ICD-10-CM

## 2012-05-03 DIAGNOSIS — Z794 Long term (current) use of insulin: Secondary | ICD-10-CM | POA: Insufficient documentation

## 2012-05-03 DIAGNOSIS — IMO0002 Reserved for concepts with insufficient information to code with codable children: Secondary | ICD-10-CM | POA: Insufficient documentation

## 2012-05-03 DIAGNOSIS — E119 Type 2 diabetes mellitus without complications: Secondary | ICD-10-CM | POA: Insufficient documentation

## 2012-05-03 DIAGNOSIS — F17200 Nicotine dependence, unspecified, uncomplicated: Secondary | ICD-10-CM | POA: Insufficient documentation

## 2012-05-03 DIAGNOSIS — G589 Mononeuropathy, unspecified: Secondary | ICD-10-CM | POA: Insufficient documentation

## 2012-05-03 MED ORDER — PREDNISONE 10 MG PO TABS: ORAL_TABLET | ORAL | Status: DC

## 2012-05-03 MED ORDER — PREDNISONE 20 MG PO TABS
60.0000 mg | ORAL_TABLET | Freq: Once | ORAL | Status: AC
  Administered 2012-05-03: 60 mg via ORAL
  Filled 2012-05-03: qty 3

## 2012-05-03 MED ORDER — TRAMADOL HCL 50 MG PO TABS: 50.0000 mg | ORAL_TABLET | Freq: Four times a day (QID) | ORAL | Status: DC | PRN

## 2012-05-03 NOTE — ED Provider Notes (Signed)
History     CSN: [id number]  Arrival date & time 05/03/12  1959   First MD Initiated Contact with Patient 05/03/12 2028      Chief Complaint  Patient presents with  . Sciatica  . Back Pain    (Consider location/radiation/quality/duration/timing/severity/associated sxs/prior treatment) HPI Comments: States she was here ~ 1 month ago with lower back pain after lifting at work.  The back is less intense but still present and she now has radiation to R mid thigh.  No new injury.  Patient is a 41 y.o. female presenting with back pain. The history is provided by the patient. No language interpreter was used.  Back Pain  This is a new problem. The problem occurs constantly. The problem has not changed since onset.The pain is present in the lumbar spine. The quality of the pain is described as burning. The pain radiates to the right thigh. The pain is moderate. The symptoms are aggravated by bending and twisting. Associated symptoms include leg pain and paresthesias. Pertinent negatives include no fever, no bowel incontinence, no perianal numbness, no bladder incontinence, no paresis, no tingling and no weakness. She has tried nothing for the symptoms.    Past Medical History  Diagnosis Date  . Diabetes mellitus   . Neuropathy     Past Surgical History  Procedure Date  . Cesarean section   . Tubal ligation     History reviewed. No pertinent family history.  History  Substance Use Topics  . Smoking status: Current Every Day Smoker -- 1.0 packs/day    Types: Cigarettes  . Smokeless tobacco: Not on file  . Alcohol Use: No    OB History    Grav Para Term Preterm Abortions TAB SAB Ect Mult Living                  Review of Systems  Constitutional: Negative for fever and chills.  Gastrointestinal: Negative for bowel incontinence.  Genitourinary: Negative for bladder incontinence.  Musculoskeletal: Positive for back pain.  Neurological: Positive for paresthesias. Negative for  tingling and weakness.  All other systems reviewed and are negative.    Allergies  Review of patient's allergies indicates no known allergies.  Home Medications   Current Outpatient Rx  Name Route Sig Dispense Refill  . ACETAMINOPHEN 500 MG PO TABS Oral Take 1,000 mg by mouth every 6 (six) hours as needed. For pain    . BACLOFEN 10 MG PO TABS Oral Take 1 tablet (10 mg total) by mouth 3 (three) times daily. 21 each 0  . DIPHENHYDRAMINE HCL 25 MG PO TABS Oral Take 25 mg by mouth every 6 (six) hours as needed. For itching    . GABAPENTIN 300 MG PO CAPS Oral Take 600 mg by mouth 2 (two) times daily.    Marland Kitchen GLIPIZIDE 10 MG PO TABS Oral Take 10 mg by mouth 2 (two) times daily before a meal.    . GUAIFENESIN 100 MG/5ML PO SOLN Oral Take 10 mLs by mouth every 4 (four) hours as needed. For cough    . HYDROCODONE-ACETAMINOPHEN 5-325 MG PO TABS  1 or 2 po q4h prn pain 20 tablet 0  . INSULIN GLARGINE 100 UNIT/ML [place] SOLN Subcutaneous Inject 20 Units into the skin at bedtime.     Marland Kitchen LISINOPRIL 5 MG PO TABS Oral Take 5 mg by mouth daily.    Marland Kitchen METFORMIN HCL 1000 MG PO TABS Oral Take by mouth 2 (two) times daily with a meal.    .  PREDNISONE 10 MG PO TABS  Taper QD: [id number] 21 tablet 0  . PSEUDOEPH-DOXYLAMINE-DM-APAP 60-7.12-26-998 MG/30ML PO LIQD Oral Take 30 mLs by mouth at bedtime.    . TRAMADOL HCL 50 MG PO TABS Oral Take 1 tablet (50 mg total) by mouth every 6 (six) hours as needed for pain. 30 tablet 0    BP 151/92  Pulse 92  Temp 98.9 F (37.2 C) (Oral)  Resp 18  Ht 5' 3"  (1.6 m)  Wt 175 lb (79.379 kg)  BMI 31.00 kg/m2  SpO2 100%  Physical Exam  Nursing note and vitals reviewed. Constitutional: She is oriented to person, place, and time. She appears well-developed and well-nourished. No distress.  HENT:  Head: Normocephalic and atraumatic.  Eyes: EOM are normal.  Neck: Normal range of motion.  Cardiovascular: Normal rate, regular rhythm and normal heart sounds.   Pulmonary/Chest:  Effort normal and breath sounds normal.  Abdominal: Soft. She exhibits no distension. There is no tenderness.  Musculoskeletal: Normal range of motion.       Right hip: She exhibits normal range of motion, normal strength, no tenderness, no bony tenderness, no swelling, no crepitus, no deformity and no laceration.       Back:       Legs: Neurological: She is alert and oriented to person, place, and time. Coordination normal.  Skin: Skin is warm and dry.  Psychiatric: She has a normal mood and affect. Judgment normal.    ED Course  Procedures (including critical care time)  Labs Reviewed - No data to display No results found.   1. Lumbar back pain   2. Lumbar radiculopathy       MDM  Prednisone taper rx-tramadol Ice, f/u [place], [place] 05/03/12 2112

## 2012-05-03 NOTE — ED Notes (Signed)
Pt returns to ED secondary to low back pain after and injury at work 4 weeks ago. Pt denies further injury at this time. NAD. Pulses equal bilaterally, denies dysfunction of bladder/bowel at this time. And radiation of pain

## 2012-05-03 NOTE — ED Notes (Signed)
Pt c/o lower right side back pain radiating down right leg.

## 2012-05-04 NOTE — ED Provider Notes (Signed)
Medical screening examination/treatment/procedure(s) were performed by non-physician practitioner and as supervising physician I was immediately available for consultation/collaboration.   Maudry Diego, MD 05/04/12 [phone]

## 2012-07-15 ENCOUNTER — Emergency Department (HOSPITAL_COMMUNITY)
Admission: EM | Admit: 2012-07-15 | Discharge: 2012-07-16 | Discharge status: Against Medical Advice | Payer: Self-pay | Attending: Emergency Medicine | Admitting: Emergency Medicine

## 2012-07-15 ENCOUNTER — Encounter (HOSPITAL_COMMUNITY): Payer: Self-pay | Admitting: *Deleted

## 2012-07-15 DIAGNOSIS — R0602 Shortness of breath: Secondary | ICD-10-CM | POA: Insufficient documentation

## 2012-07-15 HISTORY — DX: Unspecified asthma, uncomplicated: J45.909

## 2012-07-15 NOTE — ED Notes (Signed)
Pt reporting increased SOB for about 2 weeks.  Reports occasional productive cough.  Denies fever. Reports vomiting once yesterday

## 2012-07-15 NOTE — ED Notes (Signed)
Called third time for room placement.  No response.

## 2012-07-15 NOTE — ED Notes (Signed)
Called once for room placement.  No response.

## 2012-07-15 NOTE — ED Notes (Signed)
Called second time for room placement. No response.

## 2012-09-12 ENCOUNTER — Encounter (HOSPITAL_COMMUNITY): Payer: Self-pay | Admitting: *Deleted

## 2012-09-12 ENCOUNTER — Emergency Department (HOSPITAL_COMMUNITY)
Admission: EM | Admit: 2012-09-12 | Discharge: 2012-09-12 | Disposition: A | Discharge status: Home / Self Care | Payer: Self-pay | Attending: Emergency Medicine | Admitting: Emergency Medicine

## 2012-09-12 DIAGNOSIS — Z79899 Other long term (current) drug therapy: Secondary | ICD-10-CM | POA: Insufficient documentation

## 2012-09-12 DIAGNOSIS — Z794 Long term (current) use of insulin: Secondary | ICD-10-CM | POA: Insufficient documentation

## 2012-09-12 DIAGNOSIS — J45909 Unspecified asthma, uncomplicated: Secondary | ICD-10-CM | POA: Insufficient documentation

## 2012-09-12 DIAGNOSIS — R0982 Postnasal drip: Secondary | ICD-10-CM | POA: Insufficient documentation

## 2012-09-12 DIAGNOSIS — R51 Headache: Secondary | ICD-10-CM | POA: Insufficient documentation

## 2012-09-12 DIAGNOSIS — R739 Hyperglycemia, unspecified: Secondary | ICD-10-CM

## 2012-09-12 DIAGNOSIS — Z8669 Personal history of other diseases of the nervous system and sense organs: Secondary | ICD-10-CM | POA: Insufficient documentation

## 2012-09-12 DIAGNOSIS — R519 Headache, unspecified: Secondary | ICD-10-CM

## 2012-09-12 DIAGNOSIS — E1169 Type 2 diabetes mellitus with other specified complication: Secondary | ICD-10-CM | POA: Insufficient documentation

## 2012-09-12 DIAGNOSIS — I10 Essential (primary) hypertension: Secondary | ICD-10-CM | POA: Insufficient documentation

## 2012-09-12 DIAGNOSIS — F17200 Nicotine dependence, unspecified, uncomplicated: Secondary | ICD-10-CM | POA: Insufficient documentation

## 2012-09-12 LAB — GLUCOSE, CAPILLARY: Glucose-Capillary: 437 mg/dL — ABNORMAL HIGH (ref 70–99)

## 2012-09-12 MED ORDER — KETOROLAC TROMETHAMINE 30 MG/ML IJ SOLN
30.0000 mg | Freq: Once | INTRAMUSCULAR | Status: AC
  Administered 2012-09-12: 30 mg via INTRAVENOUS
  Filled 2012-09-12: qty 1

## 2012-09-12 MED ORDER — NAPROXEN 500 MG PO TABS: 500.0000 mg | ORAL_TABLET | Freq: Two times a day (BID) | ORAL | Status: DC

## 2012-09-12 MED ORDER — SODIUM CHLORIDE 0.9 % IV BOLUS (SEPSIS)
1000.0000 mL | Freq: Once | INTRAVENOUS | Status: AC
  Administered 2012-09-12: 1000 mL via INTRAVENOUS

## 2012-09-12 MED ORDER — METOCLOPRAMIDE HCL 10 MG PO TABS: 10.0000 mg | ORAL_TABLET | Freq: Three times a day (TID) | ORAL | Status: DC | PRN

## 2012-09-12 MED ORDER — AZITHROMYCIN 250 MG PO TABS: 250.0000 mg | ORAL_TABLET | Freq: Every day | ORAL | Status: DC

## 2012-09-12 MED ORDER — METOCLOPRAMIDE HCL 5 MG/ML IJ SOLN
10.0000 mg | Freq: Once | INTRAMUSCULAR | Status: AC
  Administered 2012-09-12: 10 mg via INTRAVENOUS
  Filled 2012-09-12: qty 2

## 2012-09-12 MED ORDER — AZITHROMYCIN 250 MG PO TABS
1000.0000 mg | ORAL_TABLET | Freq: Once | ORAL | Status: AC
  Administered 2012-09-12: 1000 mg via ORAL
  Filled 2012-09-12: qty 4

## 2012-09-12 NOTE — ED Provider Notes (Signed)
History     CSN: [id number]  Arrival date & time 09/12/12  1252   First MD Initiated Contact with Patient 09/12/12 1359      Chief Complaint  Patient presents with  . Headache  . Facial Pain    (Consider location/radiation/quality/duration/timing/severity/associated sxs/prior treatment) HPI Comments:  42 year old female with a history of diabetes and hypertension who states that she has not been taking her diabetes medications though she cannot give me a good reason presents with a complaint of a headache. She states the headache started 4 days ago, has been intermittent but over the last 24 hours became much worse. It is a throbbing sensation which is bitemporal, persistent over the last 12 hours, worsening with movement, bending over and associated with sinus pressure, postnasal drip purulent discharge pain block from her nose. She denies fevers or chills, nausea or vomiting and has minimal photophobia. She does not get headaches very often. She denies any neck pain and denies any numbness or weakness of her extremities, denies blurred vision. She has had over-the-counter medications for pain prior to arrival  Patient is a 42 y.o. female presenting with headaches. The history is provided by the patient and a friend.  Headache   Past Medical History  Diagnosis Date  . Diabetes mellitus   . Neuropathy   . Asthma   . Hypertension     Past Surgical History  Procedure Laterality Date  . Cesarean section    . Tubal ligation      History reviewed. No pertinent family history.  History  Substance Use Topics  . Smoking status: Current Every Day Smoker -- 1.00 packs/day    Types: Cigarettes  . Smokeless tobacco: Not on file  . Alcohol Use: No    OB History   Grav Para Term Preterm Abortions TAB SAB Ect Mult Living                  Review of Systems  Neurological: Positive for headaches.  All other systems reviewed and are negative.    Allergies  Review of patient's  allergies indicates no known allergies.  Home Medications   Current Outpatient Rx  Name  Route  Sig  Dispense  Refill  . acetaminophen (TYLENOL) 500 MG tablet   Oral   Take 1,000 mg by mouth every 6 (six) hours as needed. For pain         . diphenhydrAMINE (BENADRYL) 25 MG tablet   Oral   Take 25 mg by mouth every 6 (six) hours as needed. For itching         . gabapentin (NEURONTIN) 300 MG capsule   Oral   Take 600 mg by mouth 2 (two) times daily.         Marland Kitchen glipiZIDE (GLUCOTROL) 10 MG tablet   Oral   Take 10 mg by mouth 2 (two) times daily before a meal.         . insulin glargine (LANTUS) 100 UNIT/ML injection   Subcutaneous   Inject 20 Units into the skin at bedtime.          Marland Kitchen lisinopril (PRINIVIL,ZESTRIL) 5 MG tablet   Oral   Take 5 mg by mouth daily.         . metFORMIN (GLUCOPHAGE) 1000 MG tablet   Oral   Take by mouth 2 (two) times daily with a meal.         . Pseudoeph-Doxylamine-DM-APAP (NYQUIL) 60-7.12-26-998 MG/30ML LIQD   Oral   Take  30 mLs by mouth at bedtime.         Marland Kitchen azithromycin (ZITHROMAX Z-PAK) 250 MG tablet   Oral   Take 1 tablet (250 mg total) by mouth daily. 561m PO day 1, then 2578mPO days 205   6 tablet   0   . metoCLOPramide (REGLAN) 10 MG tablet   Oral   Take 1 tablet (10 mg total) by mouth 3 (three) times daily as needed (headache / nausea).   20 tablet   0   . naproxen (NAPROSYN) 500 MG tablet   Oral   Take 1 tablet (500 mg total) by mouth 2 (two) times daily with a meal.   30 tablet   0     BP 104/76  Pulse 90  Temp(Src) 97.9 F (36.6 C) (Oral)  Ht 5' 3"  (1.6 m)  Wt 175 lb (79.379 kg)  BMI 31.01 kg/m2  SpO2 97%  Physical Exam  Nursing note and vitals reviewed. Constitutional: She appears well-developed and well-nourished. No distress.  HENT:  Head: Normocephalic and atraumatic.  Mouth/Throat: Oropharynx is clear and moist. No oropharyngeal exudate.  Discharge in the bilateral nares, tenderness over  the bilateral maxillary sinuses. Tympanic membranes clear bilaterally, oropharynx is clear and moist  Eyes: Conjunctivae and EOM are normal. Pupils are equal, round, and reactive to light. Right eye exhibits no discharge. Left eye exhibits no discharge. No scleral icterus.  Neck: Normal range of motion. Neck supple. No JVD present. No thyromegaly present.  Very supple neck, no lymphadenopathy, no meningismus  Cardiovascular: Normal rate, regular rhythm, normal heart sounds and intact distal pulses.  Exam reveals no gallop and no friction rub.   No murmur heard. Pulmonary/Chest: Effort normal and breath sounds normal. No respiratory distress. She has no wheezes. She has no rales.  Abdominal: Soft. Bowel sounds are normal. She exhibits no distension and no mass. There is no tenderness.  Musculoskeletal: Normal range of motion. She exhibits no edema and no tenderness.  Lymphadenopathy:    She has no cervical adenopathy.  Neurological: She is alert. Coordination normal.  Gait is normal, speech is normal, speaks in full sentences, cranial nerves III through XII are intact, no limb ataxia, coordinated movements present  Skin: Skin is warm and dry. No rash noted. No erythema.  Psychiatric: She has a normal mood and affect. Her behavior is normal.    ED Course  Procedures (including critical care time)  Labs Reviewed  GLUCOSE, CAPILLARY - Abnormal; Notable for the following:    Glucose-Capillary 437 (*)    All other components within normal limits   No results found.   1. Sinus headache   2. Hyperglycemia       MDM  The patient has normal vital signs, she is afebrile, she is not tachycardic and has a normal blood pressure. She has signs and symptoms consistent with a sinus headache. She has no neurologic abnormalities on exam. She will be given intravenous Toradol and Reglan, antibiotics  Filed Vitals:   09/12/12 1321  BP: 104/76  Pulse: 90  Temp: 97.9 F (36.6 C)   The patient  has been reevaluated by myself, she has normal vital signs, blood sugar was 430, IV fluids have been given, her headache is completely gone and she feels 100% better. She is agreeable to take her medications as she gets home for her blood sugar and will followup with her family Dr.  ZiAzucena FallenNaprosyn  Reglan        BrAaron Edelman  Amparo Bristol, MD 09/12/12 1556

## 2012-09-12 NOTE — ED Notes (Addendum)
Pt co headache x2 days, denies n/v, sinus hurting as well. Pt is a diabetic, has not been checking sugar for several weeks.

## 2012-09-14 ENCOUNTER — Encounter (HOSPITAL_COMMUNITY): Payer: Self-pay | Admitting: *Deleted

## 2012-09-14 ENCOUNTER — Emergency Department (HOSPITAL_COMMUNITY)
Admission: EM | Admit: 2012-09-14 | Discharge: 2012-09-14 | Disposition: A | Discharge status: Home / Self Care | Payer: Self-pay | Attending: Emergency Medicine | Admitting: Emergency Medicine

## 2012-09-14 DIAGNOSIS — K130 Diseases of lips: Secondary | ICD-10-CM | POA: Insufficient documentation

## 2012-09-14 DIAGNOSIS — Z8669 Personal history of other diseases of the nervous system and sense organs: Secondary | ICD-10-CM | POA: Insufficient documentation

## 2012-09-14 DIAGNOSIS — E119 Type 2 diabetes mellitus without complications: Secondary | ICD-10-CM | POA: Insufficient documentation

## 2012-09-14 DIAGNOSIS — H00033 Abscess of eyelid right eye, unspecified eyelid: Secondary | ICD-10-CM

## 2012-09-14 DIAGNOSIS — I10 Essential (primary) hypertension: Secondary | ICD-10-CM | POA: Insufficient documentation

## 2012-09-14 DIAGNOSIS — F17200 Nicotine dependence, unspecified, uncomplicated: Secondary | ICD-10-CM | POA: Insufficient documentation

## 2012-09-14 DIAGNOSIS — Z794 Long term (current) use of insulin: Secondary | ICD-10-CM | POA: Insufficient documentation

## 2012-09-14 DIAGNOSIS — J45909 Unspecified asthma, uncomplicated: Secondary | ICD-10-CM | POA: Insufficient documentation

## 2012-09-14 DIAGNOSIS — H00039 Abscess of eyelid unspecified eye, unspecified eyelid: Secondary | ICD-10-CM | POA: Insufficient documentation

## 2012-09-14 DIAGNOSIS — Z79899 Other long term (current) drug therapy: Secondary | ICD-10-CM | POA: Insufficient documentation

## 2012-09-14 MED ORDER — TRAMADOL HCL 50 MG PO TABS: 100.0000 mg | ORAL_TABLET | Freq: Four times a day (QID) | ORAL | Status: DC | PRN

## 2012-09-14 MED ORDER — SULFAMETHOXAZOLE-TRIMETHOPRIM 800-160 MG PO TABS: 1.0000 | ORAL_TABLET | Freq: Two times a day (BID) | ORAL | Status: DC

## 2012-09-14 NOTE — ED Provider Notes (Signed)
History    This chart was scribed for Kara Norrie, MD by Malen Gauze, ED Scribe. The patient was seen in room APA06/APA06 and the patient's care was started at 8:02AM.    CSN: [id number]  Arrival date & time 09/14/12  0746   First MD Initiated Contact with Patient 09/14/12 [phone]      Chief Complaint  Patient presents with  . Facial Swelling    (Consider location/radiation/quality/duration/timing/severity/associated sxs/prior treatment) The history is provided by the patient. No language interpreter was used.   Kara Knapp is a 42 y.o. female who presents to the Emergency Department complaining of persistent, moderate left upper lip pain and swelling with a blister with a gradual onset 2 days ago. She reports the pain started when she was here at the ED for a sinus infection and headache without rhinorrhea. She was Rx azithromycin and started taking it yesterday. She reports her sinus infection is improving. She reports that her blister has drainage when she tried to bust it. She states he was draining last night. She denies any swelling to the inside of her mouth or tongue and denies trouble breathing or swallowing. She also has a swollen area on her right lower eyelid that she reports is getting better.  Denies HA, fever, neck pain, sore throat, rash, back pain, CP, SOB, abdominal pain, nausea, emesis, diarrhea, dysuria, or extremity pain, edema, weakness, numbness, or tingling. She has a hx of abscesses in her groing.   No known allergies. No other pertinent medical symptoms. She smokes about 1.5 pack/day.  PCP: [facility]  Past Medical History  Diagnosis Date  . Diabetes mellitus   . Neuropathy   . Asthma   . Hypertension     Past Surgical History  Procedure Laterality Date  . Cesarean section    . Tubal ligation      No family history on file.  History  Substance Use Topics  . Smoking status: Current Every Day Smoker -- 1.00 packs/day     Types: Cigarettes  . Smokeless tobacco: Not on file  . Alcohol Use: No   unemployed   OB History   Grav Para Term Preterm Abortions TAB SAB Ect Mult Living                  Review of Systems 10 Systems reviewed and all are negative for acute change except as noted in the HPI.   Allergies  Review of patient's allergies indicates no known allergies.  Home Medications   Current Outpatient Rx  Name  Route  Sig  Dispense  Refill  . acetaminophen (TYLENOL) 500 MG tablet   Oral   Take 1,000 mg by mouth every 6 (six) hours as needed. For pain         . azithromycin (ZITHROMAX Z-PAK) 250 MG tablet   Oral   Take 1 tablet (250 mg total) by mouth daily. 557m PO day 1, then 2528mPO days 205   6 tablet   0   . diphenhydrAMINE (BENADRYL) 25 MG tablet   Oral   Take 25 mg by mouth every 6 (six) hours as needed. For itching         . gabapentin (NEURONTIN) 300 MG capsule   Oral   Take 600 mg by mouth 2 (two) times daily.         . Marland KitchenlipiZIDE (GLUCOTROL) 10 MG tablet   Oral   Take 10 mg by mouth 2 (two) times daily  before a meal.         . insulin glargine (LANTUS) 100 UNIT/ML injection   Subcutaneous   Inject 20 Units into the skin at bedtime.          Marland Kitchen lisinopril (PRINIVIL,ZESTRIL) 5 MG tablet   Oral   Take 5 mg by mouth daily.         . metFORMIN (GLUCOPHAGE) 1000 MG tablet   Oral   Take by mouth 2 (two) times daily with a meal.         . metoCLOPramide (REGLAN) 10 MG tablet   Oral   Take 1 tablet (10 mg total) by mouth 3 (three) times daily as needed (headache / nausea).   20 tablet   0   . naproxen (NAPROSYN) 500 MG tablet   Oral   Take 1 tablet (500 mg total) by mouth 2 (two) times daily with a meal.   30 tablet   0   . Pseudoeph-Doxylamine-DM-APAP (NYQUIL) 60-7.12-26-998 MG/30ML LIQD   Oral   Take 30 mLs by mouth at bedtime.           BP 149/96  Pulse 79  Temp(Src) 97.9 F (36.6 C) (Oral)  Resp 16  SpO2 95%  LMP  08/08/2012  Vital signs normal    Physical Exam  Nursing note and vitals reviewed. Constitutional: She is oriented to person, place, and time. She appears well-developed and well-nourished.  Non-toxic appearance. She does not appear ill. No distress.  HENT:  Head: Normocephalic and atraumatic.  Right Ear: External ear normal.  Left Ear: External ear normal.  Nose: Nose normal. No mucosal edema or rhinorrhea.  Mouth/Throat: Oropharynx is clear and moist and mucous membranes are normal. No dental abscesses or edematous.    Pustular lesion at the vermillion border of the left upper lip with diffuse swelling. Poor dentition in upper teeth with several teeth rotten and vertically in half. She also has a small pustular lesion (which she states is getting smaller) on her right lower eyelid near the lid margin in the middle of the eyelid  Eyes: Conjunctivae and EOM are normal. Pupils are equal, round, and reactive to light.    Small lesion to the right lower mid eyelid with mild erythema in the mid margin.  Neck: Normal range of motion and full passive range of motion without pain. Neck supple.  Pulmonary/Chest: Effort normal. No respiratory distress. She has no rhonchi. She exhibits no crepitus.  Abdominal: Normal appearance.  Musculoskeletal: Normal range of motion.  Moves all extremities well.   Lymphadenopathy:    She has no cervical adenopathy.  Neurological: She is alert and oriented to person, place, and time. She has normal strength. No cranial nerve deficit.  Skin: Skin is warm, dry and intact. No rash noted. No erythema. No pallor.  Psychiatric: She has a normal mood and affect. Her speech is normal and behavior is normal. Her mood appears not anxious.    ED Course  Procedures (including critical care time)  DIAGNOSTIC STUDIES: Oxygen Saturation is 97% on room air, normal by my interpretation.    COORDINATION OF CARE:  8:07AM - septra and tramadol will be Rx for [place]. She is advised to apply warm compresses at home. She is ready for d/c.     1. Abscess, lip   2. Abscess, eyelid, right     Discharge Medication List as of 09/14/2012  8:27 AM    START taking these medications   Details  sulfamethoxazole-trimethoprim (SEPTRA DS) 800-160 MG per tablet Take 1 tablet by mouth every 12 (twelve) hours., Starting 09/14/2012, Until Discontinued, Print    traMADol (ULTRAM) 50 MG tablet Take 2 tablets (100 mg total) by mouth every 6 (six) hours as needed for pain., Starting 09/14/2012, Until Discontinued, Print        Plan discharge  Rolland Porter, MD, FACEP   MDM   I personally performed the services described in this documentation, which was scribed in my presence. The recorded information has been reviewed and considered.  Rolland Porter, MD, FACEP        Kara Norrie, MD 09/14/12 [phone]

## 2012-09-14 NOTE — ED Notes (Signed)
Sore / bump to left upper lip. First noticed 2 days ago.

## 2012-11-19 ENCOUNTER — Emergency Department (HOSPITAL_COMMUNITY)
Admission: EM | Admit: 2012-11-19 | Discharge: 2012-11-19 | Disposition: A | Discharge status: Home / Self Care | Payer: Self-pay | Attending: Emergency Medicine | Admitting: Emergency Medicine

## 2012-11-19 ENCOUNTER — Emergency Department (HOSPITAL_COMMUNITY): Payer: Self-pay

## 2012-11-19 ENCOUNTER — Encounter (HOSPITAL_COMMUNITY): Payer: Self-pay

## 2012-11-19 DIAGNOSIS — Z8669 Personal history of other diseases of the nervous system and sense organs: Secondary | ICD-10-CM | POA: Insufficient documentation

## 2012-11-19 DIAGNOSIS — X500XXA Overexertion from strenuous movement or load, initial encounter: Secondary | ICD-10-CM | POA: Insufficient documentation

## 2012-11-19 DIAGNOSIS — Z794 Long term (current) use of insulin: Secondary | ICD-10-CM | POA: Insufficient documentation

## 2012-11-19 DIAGNOSIS — J45909 Unspecified asthma, uncomplicated: Secondary | ICD-10-CM | POA: Insufficient documentation

## 2012-11-19 DIAGNOSIS — Y9301 Activity, walking, marching and hiking: Secondary | ICD-10-CM | POA: Insufficient documentation

## 2012-11-19 DIAGNOSIS — E119 Type 2 diabetes mellitus without complications: Secondary | ICD-10-CM | POA: Insufficient documentation

## 2012-11-19 DIAGNOSIS — S93409A Sprain of unspecified ligament of unspecified ankle, initial encounter: Secondary | ICD-10-CM | POA: Insufficient documentation

## 2012-11-19 DIAGNOSIS — F17200 Nicotine dependence, unspecified, uncomplicated: Secondary | ICD-10-CM | POA: Insufficient documentation

## 2012-11-19 DIAGNOSIS — Z79899 Other long term (current) drug therapy: Secondary | ICD-10-CM | POA: Insufficient documentation

## 2012-11-19 DIAGNOSIS — I10 Essential (primary) hypertension: Secondary | ICD-10-CM | POA: Insufficient documentation

## 2012-11-19 DIAGNOSIS — S93402A Sprain of unspecified ligament of left ankle, initial encounter: Secondary | ICD-10-CM

## 2012-11-19 DIAGNOSIS — R296 Repeated falls: Secondary | ICD-10-CM | POA: Insufficient documentation

## 2012-11-19 DIAGNOSIS — Y929 Unspecified place or not applicable: Secondary | ICD-10-CM | POA: Insufficient documentation

## 2012-11-19 MED ORDER — HYDROCODONE-ACETAMINOPHEN 5-325 MG PO TABS: 1.0000 | ORAL_TABLET | ORAL | Status: DC | PRN

## 2012-11-19 NOTE — ED Notes (Signed)
Pt reports approx 1 month ago twisted left ankle.  Reports has had intermittent pain and swelling since.

## 2012-11-19 NOTE — ED Provider Notes (Signed)
History     CSN: [id number]  Arrival date & time 11/19/12  0944   First MD Initiated Contact with Patient 11/19/12 [phone]      Chief Complaint  Patient presents with  . Ankle Pain    (Consider location/radiation/quality/duration/timing/severity/associated sxs/prior treatment) Patient is a 42 y.o. female presenting with ankle pain. The history is provided by the patient.  Ankle Pain Location:  Ankle Time since incident:  2 weeks Injury: yes   Mechanism of injury: fall   Fall:    Fall occurred:  Walking   Impact surface:  Grass   Entrapped after fall: no   Ankle location:  L ankle Pain details:    Quality:  Aching   Severity:  Moderate Associated symptoms: no fever    Kara Knapp is a 42 y.o. female who presents to the ED with ankle pain. The pain is located in the left ankle, the medial aspect. She was going to church and while walking in high heels she twisted her ankle causing her to fall. Since then the pain has been off and on with swelling off and on. Weight bearing makes the pain worse. Rest, elevation and ibuprofen helps the pain.   Past Medical History  Diagnosis Date  . Diabetes mellitus   . Neuropathy   . Asthma   . Hypertension     Past Surgical History  Procedure Laterality Date  . Cesarean section    . Tubal ligation      No family history on file.  History  Substance Use Topics  . Smoking status: Current Every Day Smoker -- 1.00 packs/day    Types: Cigarettes  . Smokeless tobacco: Not on file  . Alcohol Use: No    OB History   Grav Para Term Preterm Abortions TAB SAB Ect Mult Living                  Review of Systems  Constitutional: Negative for fever, chills and appetite change.  HENT: Negative.   Gastrointestinal: Negative for nausea and vomiting.  Musculoskeletal: Gait problem: pain with ambulation.       Left ankle pain   Skin: Negative for wound.  Allergic/Immunologic: Negative for food allergies.  Neurological: Negative for  weakness and numbness.    Allergies  Review of patient's allergies indicates no known allergies.  Home Medications   Current Outpatient Rx  Name  Route  Sig  Dispense  Refill  . acetaminophen (TYLENOL) 500 MG tablet   Oral   Take 1,000 mg by mouth every 6 (six) hours as needed. For pain         . azithromycin (ZITHROMAX Z-PAK) 250 MG tablet   Oral   Take 1 tablet (250 mg total) by mouth daily. 555m PO day 1, then 2565mPO days 205   6 tablet   0   . diphenhydrAMINE (BENADRYL) 25 MG tablet   Oral   Take 25 mg by mouth every 6 (six) hours as needed. For itching         . gabapentin (NEURONTIN) 300 MG capsule   Oral   Take 600 mg by mouth 2 (two) times daily.         . Marland KitchenlipiZIDE (GLUCOTROL) 10 MG tablet   Oral   Take 10 mg by mouth 2 (two) times daily before a meal.         . insulin glargine (LANTUS) 100 UNIT/ML injection   Subcutaneous   Inject 20 Units into the skin  at bedtime.          Marland Kitchen lisinopril (PRINIVIL,ZESTRIL) 5 MG tablet   Oral   Take 5 mg by mouth daily.         . metFORMIN (GLUCOPHAGE) 1000 MG tablet   Oral   Take by mouth 2 (two) times daily with a meal.         . metoCLOPramide (REGLAN) 10 MG tablet   Oral   Take 1 tablet (10 mg total) by mouth 3 (three) times daily as needed (headache / nausea).   20 tablet   0   . naproxen (NAPROSYN) 500 MG tablet   Oral   Take 1 tablet (500 mg total) by mouth 2 (two) times daily with a meal.   30 tablet   0   . Pseudoeph-Doxylamine-DM-APAP (NYQUIL) 60-7.12-26-998 MG/30ML LIQD   Oral   Take 30 mLs by mouth at bedtime.         . sulfamethoxazole-trimethoprim (SEPTRA DS) 800-160 MG per tablet   Oral   Take 1 tablet by mouth every 12 (twelve) hours.   20 tablet   0   . traMADol (ULTRAM) 50 MG tablet   Oral   Take 2 tablets (100 mg total) by mouth every 6 (six) hours as needed for pain.   16 tablet   0     BP 163/110  Pulse 87  Temp(Src) 97.8 F (36.6 C) (Oral)  SpO2 94%  LMP  10/11/2012  Physical Exam  Nursing note and vitals reviewed. Constitutional: She is oriented to person, place, and time. She appears well-developed and well-nourished.  Elevated BP  HENT:  Head: Atraumatic.  Eyes: EOM are normal.  Neck: Neck supple.  Cardiovascular: Normal rate.   Pulmonary/Chest: Effort normal.  Abdominal: Soft. There is no tenderness.  Musculoskeletal:       Left ankle: She exhibits decreased range of motion. She exhibits no ecchymosis, no deformity, no laceration and normal pulse. Swelling: minimal. Tenderness. Medial malleolus tenderness found. Achilles tendon normal.       Feet:  Neurological: She is alert and oriented to person, place, and time. She has normal strength. No cranial nerve deficit or sensory deficit.  Pedal pulses equal and strong, adequate circulation, good touch sensation.-  Skin: Skin is warm and dry.  Psychiatric: She has a normal mood and affect. Her behavior is normal. Judgment and thought content normal.    ED Course  Procedures (including critical care time) Dg Ankle Complete Left  11/19/2012  *RADIOLOGY REPORT*  Clinical Data: Left ankle pain and swelling.  Twisting injury.  LEFT ANKLE COMPLETE - 3+ VIEW  Comparison: None.  Findings: Ankle mortise congruent.  Talar dome intact.  Ankle alignment is anatomic.  Soft tissues appear within normal limits. There is no fracture.  IMPRESSION: Negative.   Original Report Authenticated By: Dereck Ligas, M.D.     Assessment: 42 y.o. female with left ankle pain due to injury  Plan:  ASO, ice, elevate, pain management   Follow up with Ortho as needed  MDM  I have reviewed this patient's vital signs, nurses notes, appropriate imaging and discussed results and plan of care with the patient and she voices understanding. Patient stable for discharge home.   Medication List    TAKE these medications       HYDROcodone-acetaminophen 5-325 MG per tablet  Commonly known as:  NORCO/VICODIN  Take 1  tablet by mouth every 4 (four) hours as needed.      ASK your doctor  about these medications       acetaminophen 500 MG tablet  Commonly known as:  TYLENOL  Take 1,000 mg by mouth every 6 (six) hours as needed. For pain     gabapentin 300 MG capsule  Commonly known as:  NEURONTIN  Take 600 mg by mouth 2 (two) times daily.     glipiZIDE 10 MG tablet  Commonly known as:  GLUCOTROL  Take 10 mg by mouth 2 (two) times daily before a meal.     insulin glargine 100 UNIT/ML injection  Commonly known as:  LANTUS  Inject 20 Units into the skin at bedtime.     metFORMIN 1000 MG tablet  Commonly known as:  GLUCOPHAGE  Take by mouth 2 (two) times daily with a meal.     traMADol 50 MG tablet  Commonly known as:  ULTRAM  Take 2 tablets (100 mg total) by mouth every 6 (six) hours as needed for pain.                 Ashley Murrain, [place] 11/19/12 1029

## 2012-11-19 NOTE — ED Notes (Signed)
Pt alert & oriented x4, stable gait. Patient given discharge instructions, paperwork & prescription(s). Patient  instructed to stop at the registration desk to finish any additional paperwork. Patient verbalized understanding. Pt left department w/ no further questions. 

## 2012-11-20 NOTE — ED Provider Notes (Signed)
Medical screening examination/treatment/procedure(s) were performed by non-physician practitioner and as supervising physician I was immediately available for consultation/collaboration.  Nat Christen, MD 11/20/12 1136

## 2013-01-05 ENCOUNTER — Emergency Department (HOSPITAL_COMMUNITY)
Admission: EM | Admit: 2013-01-05 | Discharge: 2013-01-05 | Disposition: A | Discharge status: Home / Self Care | Payer: Self-pay | Attending: Emergency Medicine | Admitting: Emergency Medicine

## 2013-01-05 ENCOUNTER — Encounter (HOSPITAL_COMMUNITY): Payer: Self-pay

## 2013-01-05 DIAGNOSIS — I10 Essential (primary) hypertension: Secondary | ICD-10-CM | POA: Insufficient documentation

## 2013-01-05 DIAGNOSIS — R05 Cough: Secondary | ICD-10-CM | POA: Insufficient documentation

## 2013-01-05 DIAGNOSIS — IMO0001 Reserved for inherently not codable concepts without codable children: Secondary | ICD-10-CM | POA: Insufficient documentation

## 2013-01-05 DIAGNOSIS — Z79899 Other long term (current) drug therapy: Secondary | ICD-10-CM | POA: Insufficient documentation

## 2013-01-05 DIAGNOSIS — Z8669 Personal history of other diseases of the nervous system and sense organs: Secondary | ICD-10-CM | POA: Insufficient documentation

## 2013-01-05 DIAGNOSIS — E119 Type 2 diabetes mellitus without complications: Secondary | ICD-10-CM | POA: Insufficient documentation

## 2013-01-05 DIAGNOSIS — R059 Cough, unspecified: Secondary | ICD-10-CM | POA: Insufficient documentation

## 2013-01-05 DIAGNOSIS — R6883 Chills (without fever): Secondary | ICD-10-CM | POA: Insufficient documentation

## 2013-01-05 DIAGNOSIS — Z794 Long term (current) use of insulin: Secondary | ICD-10-CM | POA: Insufficient documentation

## 2013-01-05 DIAGNOSIS — F17200 Nicotine dependence, unspecified, uncomplicated: Secondary | ICD-10-CM | POA: Insufficient documentation

## 2013-01-05 DIAGNOSIS — J45909 Unspecified asthma, uncomplicated: Secondary | ICD-10-CM | POA: Insufficient documentation

## 2013-01-05 DIAGNOSIS — J029 Acute pharyngitis, unspecified: Secondary | ICD-10-CM | POA: Insufficient documentation

## 2013-01-05 DIAGNOSIS — J3489 Other specified disorders of nose and nasal sinuses: Secondary | ICD-10-CM | POA: Insufficient documentation

## 2013-01-05 MED ORDER — GUAIFENESIN-CODEINE 100-10 MG/5ML PO SYRP: 10.0000 mL | ORAL_SOLUTION | Freq: Three times a day (TID) | ORAL | Status: DC | PRN

## 2013-01-05 MED ORDER — AZITHROMYCIN 250 MG PO TABS: ORAL_TABLET | ORAL | Status: DC

## 2013-01-05 MED ORDER — ALBUTEROL SULFATE HFA 108 (90 BASE) MCG/ACT IN AERS
2.0000 | INHALATION_SPRAY | Freq: Once | RESPIRATORY_TRACT | Status: AC
  Administered 2013-01-05: 2 via RESPIRATORY_TRACT
  Filled 2013-01-05: qty 6.7

## 2013-01-05 NOTE — ED Provider Notes (Signed)
History     CSN: [id number]  Arrival date & time 01/05/13  1327   First MD Initiated Contact with Patient 01/05/13 1516      Chief Complaint  Patient presents with  . Cough    (Consider location/radiation/quality/duration/timing/severity/associated sxs/prior treatment) Patient is a 42 y.o. female presenting with cough. The history is provided by the patient.  Cough Cough characteristics:  Productive Onset quality:  Gradual Duration:  5 days Timing:  Intermittent Progression:  Worsening Chronicity:  New Smoker: yes   Context: upper respiratory infection   Relieved by:  Nothing Worsened by:  Activity and lying down Ineffective treatments:  None tried Associated symptoms: chills, myalgias and sore throat   Associated symptoms: no chest pain, no diaphoresis, no ear pain, no fever, no headaches, no rash, no rhinorrhea, no shortness of breath, no sinus congestion and no wheezing   Associated symptoms comment:  Nasal congestion and intermittent LE swelling that she states is worse after standing at work and resolves overnight when at rest.   Myalgias:    Location:  Generalized   Quality:  Aching   Severity:  Mild   Onset quality:  Gradual   Timing:  Constant   Progression:  Unchanged   Past Medical History  Diagnosis Date  . Diabetes mellitus   . Neuropathy   . Asthma   . Hypertension     Past Surgical History  Procedure Laterality Date  . Cesarean section    . Tubal ligation      No family history on file.  History  Substance Use Topics  . Smoking status: Current Every Day Smoker -- 1.00 packs/day    Types: Cigarettes  . Smokeless tobacco: Not on file  . Alcohol Use: No    OB History   Grav Para Term Preterm Abortions TAB SAB Ect Mult Living                  Review of Systems  Constitutional: Positive for chills. Negative for fever, diaphoresis, activity change and appetite change.  HENT: Positive for congestion and sore throat. Negative for ear pain,  facial swelling, rhinorrhea, trouble swallowing, neck pain and neck stiffness.   Eyes: Negative for visual disturbance.  Respiratory: Positive for cough. Negative for chest tightness, shortness of breath, wheezing and stridor.   Cardiovascular: Negative for chest pain.  Gastrointestinal: Negative for nausea and vomiting.  Musculoskeletal: Positive for myalgias.  Skin: Negative.  Negative for rash.  Neurological: Negative for dizziness, weakness, numbness and headaches.  Hematological: Negative for adenopathy.  Psychiatric/Behavioral: Negative for confusion.  All other systems reviewed and are negative.    Allergies  Review of patient's allergies indicates no known allergies.  Home Medications   Current Outpatient Rx  Name  Route  Sig  Dispense  Refill  . acetaminophen (TYLENOL) 500 MG tablet   Oral   Take 1,000 mg by mouth every 6 (six) hours as needed. For pain         . azithromycin (ZITHROMAX Z-PAK) 250 MG tablet      Take two tablets on day one, then one tab qd days 2-5   6 tablet   0   . gabapentin (NEURONTIN) 300 MG capsule   Oral   Take 600 mg by mouth 2 (two) times daily.         Marland Kitchen glipiZIDE (GLUCOTROL) 10 MG tablet   Oral   Take 10 mg by mouth 2 (two) times daily before a meal.         .  guaiFENesin-codeine (ROBITUSSIN AC) 100-10 MG/5ML syrup   Oral   Take 10 mLs by mouth 3 (three) times daily as needed for cough.   120 mL   0   . HYDROcodone-acetaminophen (NORCO/VICODIN) 5-325 MG per tablet   Oral   Take 1 tablet by mouth every 4 (four) hours as needed.   10 tablet   0   . insulin glargine (LANTUS) 100 UNIT/ML injection   Subcutaneous   Inject 20 Units into the skin at bedtime.          . metFORMIN (GLUCOPHAGE) 1000 MG tablet   Oral   Take by mouth 2 (two) times daily with a meal.         . traMADol (ULTRAM) 50 MG tablet   Oral   Take 2 tablets (100 mg total) by mouth every 6 (six) hours as needed for pain.   16 tablet   0     BP  141/83  Pulse 89  Temp(Src) 98.5 F (36.9 C) (Oral)  Resp 18  Ht 5' 3"  (1.6 m)  Wt 169 lb (76.658 kg)  BMI 29.94 kg/m2  SpO2 96%  LMP 12/22/2012  Physical Exam  Nursing note and vitals reviewed. Constitutional: She is oriented to person, place, and time. She appears well-developed and well-nourished. No distress.  HENT:  Head: Normocephalic and atraumatic.  Right Ear: Tympanic membrane and ear canal normal.  Left Ear: Tympanic membrane and ear canal normal.  Mouth/Throat: Uvula is midline, oropharynx is clear and moist and mucous membranes are normal. No oropharyngeal exudate.  Eyes: EOM are normal. Pupils are equal, round, and reactive to light.  Neck: Normal range of motion. Neck supple.  Cardiovascular: Normal rate, regular rhythm, normal heart sounds and intact distal pulses.   No murmur heard. Pulmonary/Chest: Effort normal. No respiratory distress. She has no wheezes. She has no rales. She exhibits no tenderness.  Coarse lungs sounds bilaterally.  No rales  Musculoskeletal: She exhibits no edema and no tenderness.  Bilateral LE's are non edematous, no erythema, no calf pain or edema.  DP pulses are brisk and symmetrical, distal sensation intact.    Lymphadenopathy:    She has no cervical adenopathy.  Neurological: She is alert and oriented to person, place, and time. She exhibits normal muscle tone. Coordination normal.  Skin: Skin is warm and dry.    ED Course  Procedures (including critical care time)  Labs Reviewed - No data to display No results found.   1. Cough       MDM     Patient is well appearing, ambulated to the desk w/o difficulty. VSS.   Coarse lungs sounds bilaterally w/o rales .  No tachycardia, hypoxia or hypoxia.  PERC negative, patient does not have LE edema , pain or erythema  to suggest DVT at this time.     Patient agrees to fluids, rest, albuterol inhaler dispensed.  Advised to f/u with her PMD of recheck, return here if the sx's worsen.     The patient appears reasonably screened and/or stabilized for discharge and I doubt any other medical condition or other [facility] requiring further screening, evaluation, or treatment in the ED at this time prior to discharge.    Revan Gendron L. Vanessa Brogden, PA-C 01/05/13 2138

## 2013-01-05 NOTE — ED Notes (Signed)
Cough for 2-3 weeks , nonproductive.  Says her feet have been swelling.Marland Kitchen  Has not taken her regular meds in 3 weeks.  She takes meds for DM and HTN.

## 2013-01-05 NOTE — ED Notes (Signed)
Complain of cough, congestion, feet and ankle pain.

## 2013-01-08 NOTE — ED Provider Notes (Signed)
Medical screening examination/treatment/procedure(s) were performed by non-physician practitioner and as supervising physician I was immediately available for consultation/collaboration.   Alfonzo Feller, DO 01/08/13 [phone]

## 2013-01-23 DIAGNOSIS — R0602 Shortness of breath: Secondary | ICD-10-CM

## 2013-03-09 ENCOUNTER — Ambulatory Visit (HOSPITAL_COMMUNITY)
Admission: RE | Admit: 2013-03-09 | Discharge: 2013-03-09 | Disposition: A | Discharge status: Home / Self Care | Payer: Self-pay | Source: Ambulatory Visit | Attending: Surgery | Admitting: Surgery

## 2013-03-09 DIAGNOSIS — M869 Osteomyelitis, unspecified: Secondary | ICD-10-CM | POA: Insufficient documentation

## 2013-03-09 LAB — GLUCOSE, CAPILLARY: Glucose-Capillary: 397 mg/dL — ABNORMAL HIGH (ref 70–99)

## 2013-03-09 NOTE — Progress Notes (Signed)
Single lumen PICC inserted to the R basilic vein for Intravenous vancomycin therapy for osteomyelitis. Ordered by Dr. Karlyn Agee. Pt tolerated procedure very well.

## 2013-05-21 ENCOUNTER — Emergency Department (HOSPITAL_COMMUNITY): Payer: Medicaid Other

## 2013-05-21 ENCOUNTER — Inpatient Hospital Stay (HOSPITAL_COMMUNITY)
Admission: EM | Admit: 2013-05-21 | Discharge: 2013-05-26 | DRG: 417 | Disposition: A | Discharge status: Home / Self Care | Payer: Self-pay | Attending: Internal Medicine | Admitting: Internal Medicine

## 2013-05-21 ENCOUNTER — Encounter (HOSPITAL_COMMUNITY): Payer: Self-pay | Admitting: Emergency Medicine

## 2013-05-21 DIAGNOSIS — K8020 Calculus of gallbladder without cholecystitis without obstruction: Secondary | ICD-10-CM

## 2013-05-21 DIAGNOSIS — I10 Essential (primary) hypertension: Secondary | ICD-10-CM

## 2013-05-21 DIAGNOSIS — Z833 Family history of diabetes mellitus: Secondary | ICD-10-CM

## 2013-05-21 DIAGNOSIS — K8064 Calculus of gallbladder and bile duct with chronic cholecystitis without obstruction: Principal | ICD-10-CM | POA: Diagnosis present

## 2013-05-21 DIAGNOSIS — R739 Hyperglycemia, unspecified: Secondary | ICD-10-CM

## 2013-05-21 DIAGNOSIS — F17200 Nicotine dependence, unspecified, uncomplicated: Secondary | ICD-10-CM | POA: Diagnosis present

## 2013-05-21 DIAGNOSIS — K819 Cholecystitis, unspecified: Secondary | ICD-10-CM

## 2013-05-21 DIAGNOSIS — J189 Pneumonia, unspecified organism: Secondary | ICD-10-CM

## 2013-05-21 DIAGNOSIS — Z23 Encounter for immunization: Secondary | ICD-10-CM

## 2013-05-21 DIAGNOSIS — E1149 Type 2 diabetes mellitus with other diabetic neurological complication: Secondary | ICD-10-CM

## 2013-05-21 DIAGNOSIS — J45909 Unspecified asthma, uncomplicated: Secondary | ICD-10-CM

## 2013-05-21 DIAGNOSIS — K8060 Calculus of gallbladder and bile duct with cholecystitis, unspecified, without obstruction: Principal | ICD-10-CM | POA: Diagnosis present

## 2013-05-21 DIAGNOSIS — E1142 Type 2 diabetes mellitus with diabetic polyneuropathy: Secondary | ICD-10-CM | POA: Diagnosis present

## 2013-05-21 DIAGNOSIS — K810 Acute cholecystitis: Secondary | ICD-10-CM | POA: Diagnosis present

## 2013-05-21 DIAGNOSIS — K859 Acute pancreatitis without necrosis or infection, unspecified: Secondary | ICD-10-CM

## 2013-05-21 DIAGNOSIS — R1012 Left upper quadrant pain: Secondary | ICD-10-CM

## 2013-05-21 HISTORY — DX: Osteomyelitis, unspecified: M86.9

## 2013-05-21 LAB — GLUCOSE, CAPILLARY
Glucose-Capillary: 362 mg/dL — ABNORMAL HIGH (ref 70–99)
Glucose-Capillary: 345 mg/dL — ABNORMAL HIGH (ref 70–99)

## 2013-05-21 LAB — COMPREHENSIVE METABOLIC PANEL
Albumin: 3.1 g/dL — ABNORMAL LOW (ref 3.5–5.2)
Alkaline Phosphatase: 98 U/L (ref 39–117)
BUN: 18 mg/dL (ref 6–23)
Chloride: 95 mEq/L — ABNORMAL LOW (ref 96–112)
GFR calc non Af Amer: 56 mL/min — ABNORMAL LOW (ref >90)
Glucose, Bld: 465 mg/dL — ABNORMAL HIGH (ref 70–99)
Potassium: 4.4 mEq/L (ref 3.5–5.1)
Total Bilirubin: 0.1 mg/dL — ABNORMAL LOW (ref 0.3–1.2)

## 2013-05-21 LAB — CBC WITH DIFFERENTIAL/PLATELET
Eosinophils Relative: 1 % (ref 0–5)
Hemoglobin: 12.2 g/dL (ref 12.0–15.0)
Lymphs Abs: 2 10*3/uL (ref 0.7–4.0)
MCV: 85.9 fL (ref 78.0–100.0)
Monocytes Relative: 6 % (ref 3–12)
Neutro Abs: 5.8 10*3/uL (ref 1.7–7.7)
Neutrophils Relative %: 69 % (ref 43–77)
RBC: 4.04 MIL/uL (ref 3.87–5.11)
RDW: 13 % (ref 11.5–15.5)

## 2013-05-21 LAB — URINALYSIS, ROUTINE W REFLEX MICROSCOPIC
Bilirubin Urine: NEGATIVE
Ketones, ur: NEGATIVE mg/dL
Leukocytes, UA: NEGATIVE
Nitrite: NEGATIVE
Protein, ur: 100 mg/dL — AB
pH: 6 (ref 5.0–8.0)

## 2013-05-21 LAB — URINE MICROSCOPIC-ADD ON

## 2013-05-21 LAB — LIPASE, BLOOD: Lipase: 123 U/L — ABNORMAL HIGH (ref 11–59)

## 2013-05-21 MED ORDER — SACCHAROMYCES BOULARDII 250 MG PO CAPS
250.0000 mg | ORAL_CAPSULE | Freq: Two times a day (BID) | ORAL | Status: DC
  Administered 2013-05-21 – 2013-05-26 (×9): 250 mg via ORAL
  Filled 2013-05-21 (×9): qty 1

## 2013-05-21 MED ORDER — ONDANSETRON HCL 4 MG/2ML IJ SOLN
4.0000 mg | Freq: Three times a day (TID) | INTRAMUSCULAR | Status: AC | PRN
  Administered 2013-05-22: 4 mg via INTRAVENOUS
  Filled 2013-05-21: qty 2

## 2013-05-21 MED ORDER — METRONIDAZOLE IN NACL 5-0.79 MG/ML-% IV SOLN
500.0000 mg | Freq: Three times a day (TID) | INTRAVENOUS | Status: DC
  Administered 2013-05-21 – 2013-05-26 (×14): 500 mg via INTRAVENOUS
  Filled 2013-05-21 (×17): qty 100

## 2013-05-21 MED ORDER — ENOXAPARIN SODIUM 40 MG/0.4ML ~~LOC~~ SOLN
40.0000 mg | SUBCUTANEOUS | Status: DC
  Administered 2013-05-21 – 2013-05-23 (×3): 40 mg via SUBCUTANEOUS
  Filled 2013-05-21 (×3): qty 0.4

## 2013-05-21 MED ORDER — IPRATROPIUM BROMIDE 0.02 % IN SOLN
0.5000 mg | Freq: Once | RESPIRATORY_TRACT | Status: AC
  Administered 2013-05-21: 0.5 mg via RESPIRATORY_TRACT
  Filled 2013-05-21: qty 2.5

## 2013-05-21 MED ORDER — LEVOFLOXACIN IN D5W 750 MG/150ML IV SOLN
750.0000 mg | INTRAVENOUS | Status: DC
  Administered 2013-05-22 – 2013-05-25 (×4): 750 mg via INTRAVENOUS
  Filled 2013-05-21 (×4): qty 150

## 2013-05-21 MED ORDER — MORPHINE SULFATE 4 MG/ML IJ SOLN
4.0000 mg | INTRAMUSCULAR | Status: DC | PRN
  Administered 2013-05-21 – 2013-05-25 (×5): 4 mg via INTRAVENOUS
  Filled 2013-05-21 (×5): qty 1

## 2013-05-21 MED ORDER — SODIUM CHLORIDE 0.9 % IV SOLN: 250.0000 mL | INTRAVENOUS | Status: DC | PRN

## 2013-05-21 MED ORDER — ALBUTEROL SULFATE (5 MG/ML) 0.5% IN NEBU
5.0000 mg | INHALATION_SOLUTION | Freq: Once | RESPIRATORY_TRACT | Status: AC
  Administered 2013-05-21: 5 mg via RESPIRATORY_TRACT
  Filled 2013-05-21: qty 1

## 2013-05-21 MED ORDER — SODIUM CHLORIDE 0.9 % IV SOLN
INTRAVENOUS | Status: DC
  Administered 2013-05-21: 18:00:00 via INTRAVENOUS

## 2013-05-21 MED ORDER — FENTANYL CITRATE 0.05 MG/ML IJ SOLN
50.0000 ug | Freq: Once | INTRAMUSCULAR | Status: AC
  Administered 2013-05-21: 50 ug via INTRAVENOUS
  Filled 2013-05-21: qty 2

## 2013-05-21 MED ORDER — BUDESONIDE-FORMOTEROL FUMARATE 160-4.5 MCG/ACT IN AERO
2.0000 | INHALATION_SPRAY | Freq: Two times a day (BID) | RESPIRATORY_TRACT | Status: DC | PRN
  Administered 2013-05-23 – 2013-05-25 (×4): 2 via RESPIRATORY_TRACT
  Filled 2013-05-21: qty 6

## 2013-05-21 MED ORDER — BUDESONIDE-FORMOTEROL FUMARATE 160-4.5 MCG/ACT IN AERO
INHALATION_SPRAY | RESPIRATORY_TRACT | Status: AC
  Filled 2013-05-21: qty 6

## 2013-05-21 MED ORDER — INSULIN GLARGINE 100 UNIT/ML ~~LOC~~ SOLN
40.0000 [IU] | Freq: Every day | SUBCUTANEOUS | Status: DC
  Administered 2013-05-21: 40 [IU] via SUBCUTANEOUS
  Filled 2013-05-21: qty 0.4

## 2013-05-21 MED ORDER — LISINOPRIL 10 MG PO TABS
10.0000 mg | ORAL_TABLET | Freq: Every day | ORAL | Status: DC
  Administered 2013-05-22 – 2013-05-24 (×3): 10 mg via ORAL
  Filled 2013-05-21 (×3): qty 1

## 2013-05-21 MED ORDER — ACETAMINOPHEN 500 MG PO TABS: 1000.0000 mg | ORAL_TABLET | Freq: Four times a day (QID) | ORAL | Status: DC | PRN

## 2013-05-21 MED ORDER — INSULIN ASPART 100 UNIT/ML ~~LOC~~ SOLN
6.0000 [IU] | Freq: Once | SUBCUTANEOUS | Status: AC
  Administered 2013-05-21: 6 [IU] via INTRAVENOUS
  Filled 2013-05-21: qty 1

## 2013-05-21 MED ORDER — SODIUM CHLORIDE 0.9 % IJ SOLN
3.0000 mL | Freq: Two times a day (BID) | INTRAMUSCULAR | Status: DC
  Administered 2013-05-23 – 2013-05-26 (×5): 3 mL via INTRAVENOUS

## 2013-05-21 MED ORDER — METRONIDAZOLE IN NACL 5-0.79 MG/ML-% IV SOLN
500.0000 mg | Freq: Once | INTRAVENOUS | Status: AC
  Administered 2013-05-21: 500 mg via INTRAVENOUS
  Filled 2013-05-21: qty 100

## 2013-05-21 MED ORDER — METRONIDAZOLE IN NACL 5-0.79 MG/ML-% IV SOLN
INTRAVENOUS | Status: AC
  Filled 2013-05-21: qty 200

## 2013-05-21 MED ORDER — SODIUM CHLORIDE 0.9 % IV SOLN
INTRAVENOUS | Status: DC
  Administered 2013-05-21 – 2013-05-22 (×4): via INTRAVENOUS
  Administered 2013-05-23: 100 mL/h via INTRAVENOUS
  Administered 2013-05-24 – 2013-05-25 (×2): via INTRAVENOUS

## 2013-05-21 MED ORDER — INSULIN ASPART 100 UNIT/ML ~~LOC~~ SOLN
0.0000 [IU] | Freq: Three times a day (TID) | SUBCUTANEOUS | Status: DC
  Administered 2013-05-22: 2 [IU] via SUBCUTANEOUS
  Administered 2013-05-22: 3 [IU] via SUBCUTANEOUS
  Administered 2013-05-23: 2 [IU] via SUBCUTANEOUS
  Administered 2013-05-23: 3 [IU] via SUBCUTANEOUS
  Administered 2013-05-23: 2 [IU] via SUBCUTANEOUS
  Administered 2013-05-24 – 2013-05-25 (×2): 3 [IU] via SUBCUTANEOUS
  Administered 2013-05-25 – 2013-05-26 (×2): 2 [IU] via SUBCUTANEOUS

## 2013-05-21 MED ORDER — LEVOFLOXACIN IN D5W 750 MG/150ML IV SOLN
750.0000 mg | Freq: Once | INTRAVENOUS | Status: AC
Start: 2013-05-21 — End: 2013-05-21
  Administered 2013-05-21: 750 mg via INTRAVENOUS
  Filled 2013-05-21: qty 150

## 2013-05-21 MED ORDER — INFLUENZA VAC SPLIT QUAD 0.5 ML IM SUSP
0.5000 mL | INTRAMUSCULAR | Status: AC
  Administered 2013-05-22: 0.5 mL via INTRAMUSCULAR
  Filled 2013-05-21: qty 0.5

## 2013-05-21 MED ORDER — ONDANSETRON HCL 4 MG/2ML IJ SOLN
4.0000 mg | Freq: Once | INTRAMUSCULAR | Status: AC
  Administered 2013-05-21: 4 mg via INTRAVENOUS
  Filled 2013-05-21: qty 2

## 2013-05-21 MED ORDER — SODIUM CHLORIDE 0.9 % IJ SOLN: 3.0000 mL | INTRAMUSCULAR | Status: DC | PRN

## 2013-05-21 MED ORDER — GABAPENTIN 300 MG PO CAPS
600.0000 mg | ORAL_CAPSULE | Freq: Three times a day (TID) | ORAL | Status: DC
  Administered 2013-05-21 – 2013-05-26 (×12): 600 mg via ORAL
  Filled 2013-05-21 (×12): qty 2

## 2013-05-21 MED ORDER — PNEUMOCOCCAL VAC POLYVALENT 25 MCG/0.5ML IJ INJ
0.5000 mL | INJECTION | INTRAMUSCULAR | Status: AC
  Administered 2013-05-22: 0.5 mL via INTRAMUSCULAR
  Filled 2013-05-21: qty 0.5

## 2013-05-21 NOTE — ED Provider Notes (Signed)
CSN: [id number]     Arrival date & time 05/21/13  1111 History  This chart was scribed for Janice Norrie, MD by Roxan Diesel, ED scribe.  This patient was seen in room APA14/APA14 and the patient's care was started at 12:43 PM.   Chief Complaint  Patient presents with  . Abdominal Pain    The history is provided by the patient. No language interpreter was used.    HPI Comments: Kara Knapp is a 42 y.o. female with h/o DM, neuropathy, HTN and asthma who presents to the Emergency Department complaining of constant upper abdominal pain that began 3 weeks ago.  Pain extends across upper abdomen and is worse on the left side.  It also extends all the way around to her back on both sides.  It is worsened by coughing and is possibly relieved by walking.  Pt has also been coughing frequently and develops SOB and wheezing during her coughing spells.  She also had 4-5 episodes of diarrhea yesterday described as loose stools.  She denies nausea, vomiting, dysuria, frequency, or chills.  Pt states she has been eating and drinking normally but stays thirsty all of the time which is chronic for her.  Pt was seen at [facility] ED for the same 3 pain weeks ago and was diagnosed with a back sprain and a UTI based on UA.  She was placed on antibiotics and flexeril which she has finished, without relief.  Pt is a current half-pack-per-day smoker and was smoking more until she was diagnosed with osteomyelitis in her left middle toe several months agoand has been cutting back since then.  She states her osteomyelitis has resolved to her knowledge.  Pt takes lisinopril for her HTN which she began taking 3 months ago.  She is on Glucophage and Lantus for her DM.  She denies personal or family h/o cholecystitis.  PCP PA Royce Macadamia   Past Medical History  Diagnosis Date  . Diabetes mellitus   . Neuropathy   . Asthma   . Hypertension   . Osteomyelitis     left middle toe     Past Surgical History   Procedure Laterality Date  . Cesarean section    . Tubal ligation      Family History  Problem Relation Age of Onset  . COPD Mother   . Cancer Father   . Diabetes Sister   . Diabetes Brother     History  Substance Use Topics  . Smoking status: Current Every Day Smoker -- 0.50 packs/day for 20 years    Types: Cigarettes  . Smokeless tobacco: Not on file  . Alcohol Use: No  employed  OB History   Grav Para Term Preterm Abortions TAB SAB Ect Mult Living   5 5              Review of Systems  Constitutional: Negative for chills.  Respiratory: Positive for shortness of breath (during coughing spells) and wheezing (during coughinig spells).   Cardiovascular: Positive for chest pain.  Gastrointestinal: Positive for abdominal pain and diarrhea. Negative for nausea and vomiting.  Endocrine: Positive for polydipsia (chronic).  Genitourinary: Negative for dysuria and frequency.  All other systems reviewed and are negative.     Allergies  Bactrim  Home Medications   Current Outpatient Rx  Name  Route  Sig  Dispense  Refill  . acetaminophen (TYLENOL) 500 MG tablet   Oral   Take 1,000 mg by mouth every 6 (six)  hours as needed. For pain         . cyclobenzaprine (FLEXERIL) 10 MG tablet   Oral   Take 10 mg by mouth 2 (two) times daily as needed for muscle spasms.         Marland Kitchen gabapentin (NEURONTIN) 300 MG capsule   Oral   Take 600 mg by mouth 3 (three) times daily.          . insulin glargine (LANTUS) 100 UNIT/ML injection   Subcutaneous   Inject 40 Units into the skin at bedtime.          Marland Kitchen lisinopril (PRINIVIL,ZESTRIL) 10 MG tablet   Oral   Take 10 mg by mouth daily.         . metFORMIN (GLUCOPHAGE) 1000 MG tablet   Oral   Take 500 mg by mouth 2 (two) times daily with a meal.          . budesonide-formoterol (SYMBICORT) 160-4.5 MCG/ACT inhaler   Inhalation   Inhale 2 puffs into the lungs 2 (two) times daily as needed (shortness of breath).          . ciprofloxacin (CIPRO) 500 MG tablet   Oral   Take 500 mg by mouth 2 (two) times daily. For 10 days, started 10/7 finished on 10/17          BP 180/90  Pulse 94  Temp(Src) 97.3 F (36.3 C) (Oral)  Resp 16  Ht 5' 3"  (1.6 m)  Wt 180 lb (81.647 kg)  BMI 31.89 kg/m2  SpO2 97%  LMP 05/21/2013  Vital signs normal    Physical Exam  Nursing note and vitals reviewed. Constitutional: She is oriented to person, place, and time. She appears well-developed and well-nourished.  Non-toxic appearance. She does not appear ill. No distress.  HENT:  Head: Normocephalic and atraumatic.  Right Ear: External ear normal.  Left Ear: External ear normal.  Nose: Nose normal. No mucosal edema or rhinorrhea.  Mouth/Throat: Oropharynx is clear and moist. Mucous membranes are dry. No dental abscesses or uvula swelling.  Eyes: Conjunctivae and EOM are normal. Pupils are equal, round, and reactive to light.  Neck: Normal range of motion and full passive range of motion without pain. Neck supple.  Cardiovascular: Normal rate, regular rhythm and normal heart sounds.  Exam reveals no gallop and no friction rub.   No murmur heard. Pulmonary/Chest: Effort normal. No respiratory distress. She has decreased breath sounds. She has wheezes. She has no rhonchi. She has no rales. She exhibits no tenderness and no crepitus.  Wheezing that clears with coughing Diminished breath sounds  Abdominal: Soft. Normal appearance and bowel sounds are normal. She exhibits no distension. There is tenderness in the right upper quadrant, epigastric area and left upper quadrant. There is no rebound and no guarding.    Tender diffusely to RUQ, LUQ and epigastric region.  Most severe to LUQ.  Musculoskeletal: Normal range of motion. She exhibits no edema and no tenderness.  Moves all extremities well.   Neurological: She is alert and oriented to person, place, and time. She has normal strength. No cranial nerve deficit.  Skin:  Skin is warm, dry and intact. No rash noted. No erythema. No pallor.  Psychiatric: She has a normal mood and affect. Her speech is normal and behavior is normal. Her mood appears not anxious.    ED Course  Procedures (including critical care time) Medications  0.9 %  sodium chloride infusion ( Intravenous New Bag/Given 05/21/13 1554)  levofloxacin (LEVAQUIN) IVPB 750 mg (not administered)  metroNIDAZOLE (FLAGYL) IVPB 500 mg (500 mg Intravenous New Bag/Given 05/21/13 1553)  fentaNYL (SUBLIMAZE) injection 50 mcg (50 mcg Intravenous Given 05/21/13 1351)  ondansetron (ZOFRAN) injection 4 mg (4 mg Intravenous Given 05/21/13 1351)  albuterol (PROVENTIL) (5 MG/ML) 0.5% nebulizer solution 5 mg (5 mg Nebulization Given 05/21/13 1321)  ipratropium (ATROVENT) nebulizer solution 0.5 mg (0.5 mg Nebulization Given 05/21/13 1321)  insulin aspart (novoLOG) injection 6 Units (6 Units Intravenous Given 05/21/13 1501)     DIAGNOSTIC STUDIES: Oxygen Saturation is 97% on room air, normal by my interpretation.    COORDINATION OF CARE: 12:53 PM: Discussed treatment plan which includes breathing treatment, IV fluids, imaging and labs.  Pt expressed understanding and agreed to plan.  She was made for her antibiotics. I would give her something to treat her respiratory infection in her cholecystitis. She was started on Levaquin which will treat both and Flagyl for her cholecystitis.  Pt given results of her tests.   15:27 Dr Arnoldo Morale, states he can consult, have medicine admit  16:00 Dr Roderic Palau, admit to med-surg, team 1  Labs Review Results for orders placed during the hospital encounter of 05/21/13  CBC WITH DIFFERENTIAL      Result Value Range   WBC 8.5  4.0 - 10.5 K/uL   RBC 4.04  3.87 - 5.11 MIL/uL   Hemoglobin 12.2  12.0 - 15.0 g/dL   HCT 34.7 (*) 36.0 - 46.0 %   MCV 85.9  78.0 - 100.0 fL   MCH 30.2  26.0 - 34.0 pg   MCHC 35.2  30.0 - 36.0 g/dL   RDW 13.0  11.5 - 15.5 %   Platelets 293  150 -  400 K/uL   Neutrophils Relative % 69  43 - 77 %   Neutro Abs 5.8  1.7 - 7.7 K/uL   Lymphocytes Relative 23  12 - 46 %   Lymphs Abs 2.0  0.7 - 4.0 K/uL   Monocytes Relative 6  3 - 12 %   Monocytes Absolute 0.5  0.1 - 1.0 K/uL   Eosinophils Relative 1  0 - 5 %   Eosinophils Absolute 0.1  0.0 - 0.7 K/uL   Basophils Relative 1  0 - 1 %   Basophils Absolute 0.1  0.0 - 0.1 K/uL  COMPREHENSIVE METABOLIC PANEL      Result Value Range   Sodium 132 (*) 135 - 145 mEq/L   Potassium 4.4  3.5 - 5.1 mEq/L   Chloride 95 (*) 96 - 112 mEq/L   CO2 27  19 - 32 mEq/L   Glucose, Bld 465 (*) 70 - 99 mg/dL   BUN 18  6 - 23 mg/dL   Creatinine, Ser 1.18 (*) 0.50 - 1.10 mg/dL   Calcium 9.7  8.4 - 10.5 mg/dL   Total Protein 6.8  6.0 - 8.3 g/dL   Albumin 3.1 (*) 3.5 - 5.2 g/dL   AST 11  0 - 37 U/L   ALT 9  0 - 35 U/L   Alkaline Phosphatase 98  39 - 117 U/L   Total Bilirubin 0.1 (*) 0.3 - 1.2 mg/dL   GFR calc non Af Amer 56 (*) >90 mL/min   GFR calc Af Amer 64 (*) >90 mL/min  LIPASE, BLOOD      Result Value Range   Lipase 123 (*) 11 - 59 U/L  URINALYSIS, ROUTINE W REFLEX MICROSCOPIC      Result Value Range  Color, Urine YELLOW  YELLOW   APPearance CLEAR  CLEAR   Specific Gravity, Urine 1.020  1.005 - 1.030   pH 6.0  5.0 - 8.0   Glucose, UA >1000 (*) NEGATIVE mg/dL   Hgb urine dipstick SMALL (*) NEGATIVE   Bilirubin Urine NEGATIVE  NEGATIVE   Ketones, ur NEGATIVE  NEGATIVE mg/dL   Protein, ur 100 (*) NEGATIVE mg/dL   Urobilinogen, UA 0.2  0.0 - 1.0 mg/dL   Nitrite NEGATIVE  NEGATIVE   Leukocytes, UA NEGATIVE  NEGATIVE  URINE MICROSCOPIC-ADD ON      Result Value Range   Squamous Epithelial / LPF MANY (*) RARE   WBC, UA 0-2  <3 WBC/hpf   RBC / HPF 3-6  <3 RBC/hpf   Bacteria, UA FEW (*) RARE   Laboratory interpretation all normal except hyperglycemia, renal insuffic,    Imaging Review Dg Chest 2 View  05/21/2013   CLINICAL DATA:  Cough and shortness of breath  EXAM: CHEST  2 VIEW   COMPARISON:  March 21, 2013  FINDINGS: There is patchy left lower lobe infiltrate. Lungs are otherwise clear. Heart size and pulmonary vascularity are normal. No adenopathy. No bone lesions.  IMPRESSION: Patchy left lower lobe infiltrate.   Electronically Signed   By: Lowella Grip M.D.   On: 05/21/2013 14:49   US Abdomen Limited Ruq  05/21/2013   CLINICAL DATA:  Right upper quadrant pain for 3 weeks.  EXAM: US ABDOMEN LIMITED - RIGHT UPPER QUADRANT  COMPARISON:  None.  FINDINGS: Gallbladder  Within the right upper quadrant, the wall -echo -shadow complex is noted, consistent with gallbladder filled with stones. Gallbladder wall is 3.1 mm in thickness. No definite pericholecystic fluid identified. No sonographic Murphy sign.  Common bile duct  Diameter: 4.1 mm  Liver:  No focal lesion identified. Within normal limits in parenchymal echogenicity.  IMPRESSION: 1. Numerous gallstones. 2. Thickened gallbladder wall. 3. No definite Murphy sign. However, the findings are suspicious for cholecystitis.   Electronically Signed   By: Shon Hale M.D.   On: 05/21/2013 14:55      MDM   1. CAP (community acquired pneumonia)   2. Cholecystitis   3. Hyperglycemia   4. Pancreatitis   5. Reactive airway disease     Plan admission   Rolland Porter, MD, FACEP   I personally performed the services described in this documentation, which was scribed in my presence. The recorded information has been reviewed and considered.  Rolland Porter, MD, Abram Sander    Janice Norrie, MD 05/21/13 [phone]

## 2013-05-21 NOTE — Progress Notes (Signed)
ANTIBIOTIC CONSULT NOTE - INITIAL  Pharmacy Consult for Levaquin Indication: rule out pneumonia  Allergies  Allergen Reactions  . Bactrim [Sulfamethoxazole-Tmp Ds] Nausea And Vomiting    Patient Measurements: Height: 5' 3"  (160 cm) Weight: 180 lb (81.647 kg) IBW/kg (Calculated) : 52.4 Adjusted Body Weight:   Vital Signs: Temp: 97.3 F (36.3 C) (10/23 1117) Temp src: Oral (10/23 1117) BP: 126/74 mmHg (10/23 1455) Pulse Rate: 88 (10/23 1455) Intake/Output from previous day:   Intake/Output from this shift:    Labs:  Recent Labs  05/21/13 1305  WBC 8.5  HGB 12.2  PLT 293  CREATININE 1.18*   Estimated Creatinine Clearance: 62.2 ml/min (by C-G formula based on Cr of 1.18). No results found for this basename: VANCOTROUGH, VANCOPEAK, VANCORANDOM, GENTTROUGH, GENTPEAK, GENTRANDOM, TOBRATROUGH, TOBRAPEAK, TOBRARND, AMIKACINPEAK, AMIKACINTROU, AMIKACIN,  in the last 72 hours   Microbiology: No results found for this or any previous visit (from the past 720 hour(s)).  Medical History: Past Medical History  Diagnosis Date  . Diabetes mellitus   . Neuropathy   . Asthma   . Hypertension   . Osteomyelitis     left middle toe     Medications:  Scheduled:  . enoxaparin (LOVENOX) injection  40 mg Subcutaneous Q24H  . gabapentin  600 mg Oral Q8H  . [START ON 05/22/2013] influenza vac split quadrivalent PF  0.5 mL Intramuscular Tomorrow-1000  . [START ON 05/22/2013] insulin aspart  0-9 Units Subcutaneous TID WC  . insulin glargine  40 Units Subcutaneous QHS  . [START ON 05/22/2013] levofloxacin (LEVAQUIN) IV  750 mg Intravenous Q24H  . [START ON 05/22/2013] lisinopril  10 mg Oral Daily  . [START ON 05/22/2013] metronidazole  500 mg Intravenous Q8H  . [START ON 05/22/2013] pneumococcal 23 valent vaccine  0.5 mL Intramuscular Tomorrow-1000  . saccharomyces boulardii  250 mg Oral BID  . sodium chloride  3 mL Intravenous Q12H   Assessment: Suspected pneumonia Levaquin  750 mg IV given tonight   Goal of Therapy:  Eradicate infection  Plan:  Levaquin 750 mg IV every 24 hours Labs per protocol   Abner Greenspan, Oshay Stranahan Bennett 05/21/2013,9:28 PM

## 2013-05-21 NOTE — ED Notes (Signed)
Abdominal pain times 3 weeks.  Went to [facility] er and was diagnosed with uti and muscle strain of back.  She has finished taking all of her medications and is not any better.

## 2013-05-21 NOTE — H&P (Addendum)
PCP:   No PCP Per Patient   Chief Complaint:  Abdominal pain  HPI: 42 year old female with a history of diabetes mellitus, asthma, hypertension, recent diagnosis of osteomyelitis status post 6 weeks of IV antibiotic therapy, peripheral neuropathy came to the hospital with 3 weeks of abdominal pain located under the ribs and left upper quadrant. The pain is intermittent and is not associated with nausea and vomiting. She also denies any diarrhea but has been having irregular bowel movements over the past few weeks. She denies chest pain no shortness of breath no fever no dysuria urgency or frequency of urination. She is mostly of stroke no seizures. In the ED abdominal ultrasound was done which shows numerous gallstones but Murphy's sign is negative.  Allergies:   Allergies  Allergen Reactions  . Bactrim [Sulfamethoxazole-Tmp Ds] Nausea And Vomiting      Past Medical History  Diagnosis Date  . Diabetes mellitus   . Neuropathy   . Asthma   . Hypertension   . Osteomyelitis     left middle toe     Past Surgical History  Procedure Laterality Date  . Cesarean section    . Tubal ligation      Prior to Admission medications   Medication Sig Start Date End Date Taking? Authorizing Provider  acetaminophen (TYLENOL) 500 MG tablet Take 1,000 mg by mouth every 6 (six) hours as needed. For pain   Yes Historical Provider, MD  cyclobenzaprine (FLEXERIL) 10 MG tablet Take 10 mg by mouth 2 (two) times daily as needed for muscle spasms.   Yes Historical Provider, MD  gabapentin (NEURONTIN) 300 MG capsule Take 600 mg by mouth 3 (three) times daily.    Yes Historical Provider, MD  insulin glargine (LANTUS) 100 UNIT/ML injection Inject 40 Units into the skin at bedtime.    Yes Historical Provider, MD  lisinopril (PRINIVIL,ZESTRIL) 10 MG tablet Take 10 mg by mouth daily.   Yes Historical Provider, MD  metFORMIN (GLUCOPHAGE) 1000 MG tablet Take 500 mg by mouth 2 (two) times daily with a meal.    Yes  Historical Provider, MD  budesonide-formoterol (SYMBICORT) 160-4.5 MCG/ACT inhaler Inhale 2 puffs into the lungs 2 (two) times daily as needed (shortness of breath).    Historical Provider, MD  ciprofloxacin (CIPRO) 500 MG tablet Take 500 mg by mouth 2 (two) times daily. For 10 days, started 10/7 finished on 10/17    Historical Provider, MD    Social History:  reports that she has been smoking Cigarettes.  She has a 10 pack-year smoking history. She does not have any smokeless tobacco history on file. She reports that she does not drink alcohol or use illicit drugs.  Family History  Problem Relation Age of Onset  . COPD Mother   . Cancer Father   . Diabetes Sister   . Diabetes Brother      All the positives are listed in BOLD  Review of Systems:  HEENT: Headache, blurred vision, runny nose, sore throat Neck: Hypothyroidism, hyperthyroidism,,lymphadenopathy Chest : Shortness of breath, history of COPD, Asthma Heart : Chest pain, history of coronary arterey disease GI:  Nausea, vomiting, diarrhea, constipation, GERD GU: Dysuria, urgency, frequency of urination, hematuria Neuro: Stroke, seizures, syncope Psych: Depression, anxiety, hallucinations   Physical Exam: Blood pressure 126/74, pulse 88, temperature 97.3 F (36.3 C), temperature source Oral, resp. rate 18, height 5' 3"  (1.6 m), weight 81.647 kg (180 lb), last menstrual period 05/19/2013, SpO2 94.00%. Constitutional:   Patient is a well-developed and  well-nourished *female in no acute distress and cooperative with exam. Head: Normocephalic and atraumatic Mouth: Mucus membranes moist Eyes: PERRL, EOMI, conjunctivae normal Neck: Supple, No Thyromegaly Cardiovascular: RRR, S1 normal, S2 normal Pulmonary/Chest: CTAB, no wheezes, rales, or rhonchi Abdominal: Soft. Mild tenderness to palpation in the left upper quadrant, Murphy's sign negative ,non-distended, bowel sounds are normal, no masses, organomegaly, or guarding present.   Neurological: A&O x3, Strenght is normal and symmetric bilaterally, cranial nerve II-XII are grossly intact, no focal motor deficit, sensory intact to light touch bilaterally.  Extremities : No Cyanosis, Clubbing or Edema   Labs on Admission:  Results for orders placed during the hospital encounter of 05/21/13 (from the past 48 hour(s))  CBC WITH DIFFERENTIAL     Status: Abnormal   Collection Time    05/21/13  1:05 PM      Result Value Range   WBC 8.5  4.0 - 10.5 K/uL   RBC 4.04  3.87 - 5.11 MIL/uL   Hemoglobin 12.2  12.0 - 15.0 g/dL   HCT 34.7 (*) 36.0 - 46.0 %   MCV 85.9  78.0 - 100.0 fL   MCH 30.2  26.0 - 34.0 pg   MCHC 35.2  30.0 - 36.0 g/dL   RDW 13.0  11.5 - 15.5 %   Platelets 293  150 - 400 K/uL   Neutrophils Relative % 69  43 - 77 %   Neutro Abs 5.8  1.7 - 7.7 K/uL   Lymphocytes Relative 23  12 - 46 %   Lymphs Abs 2.0  0.7 - 4.0 K/uL   Monocytes Relative 6  3 - 12 %   Monocytes Absolute 0.5  0.1 - 1.0 K/uL   Eosinophils Relative 1  0 - 5 %   Eosinophils Absolute 0.1  0.0 - 0.7 K/uL   Basophils Relative 1  0 - 1 %   Basophils Absolute 0.1  0.0 - 0.1 K/uL  COMPREHENSIVE METABOLIC PANEL     Status: Abnormal   Collection Time    05/21/13  1:05 PM      Result Value Range   Sodium 132 (*) 135 - 145 mEq/L   Potassium 4.4  3.5 - 5.1 mEq/L   Chloride 95 (*) 96 - 112 mEq/L   CO2 27  19 - 32 mEq/L   Glucose, Bld 465 (*) 70 - 99 mg/dL   BUN 18  6 - 23 mg/dL   Creatinine, Ser 1.18 (*) 0.50 - 1.10 mg/dL   Calcium 9.7  8.4 - 10.5 mg/dL   Total Protein 6.8  6.0 - 8.3 g/dL   Albumin 3.1 (*) 3.5 - 5.2 g/dL   AST 11  0 - 37 U/L   ALT 9  0 - 35 U/L   Alkaline Phosphatase 98  39 - 117 U/L   Total Bilirubin 0.1 (*) 0.3 - 1.2 mg/dL   GFR calc non Af Amer 56 (*) >90 mL/min   GFR calc Af Amer 64 (*) >90 mL/min   Comment: (NOTE)     The eGFR has been calculated using the CKD EPI equation.     This calculation has not been validated in all clinical situations.     eGFR's  persistently <90 mL/min signify possible Chronic Kidney     Disease.  LIPASE, BLOOD     Status: Abnormal   Collection Time    05/21/13  1:05 PM      Result Value Range   Lipase 123 (*) 11 - 59 U/L  URINALYSIS, ROUTINE W REFLEX MICROSCOPIC     Status: Abnormal   Collection Time    05/21/13  1:51 PM      Result Value Range   Color, Urine YELLOW  YELLOW   APPearance CLEAR  CLEAR   Specific Gravity, Urine 1.020  1.005 - 1.030   pH 6.0  5.0 - 8.0   Glucose, UA >1000 (*) NEGATIVE mg/dL   Hgb urine dipstick SMALL (*) NEGATIVE   Bilirubin Urine NEGATIVE  NEGATIVE   Ketones, ur NEGATIVE  NEGATIVE mg/dL   Protein, ur 100 (*) NEGATIVE mg/dL   Urobilinogen, UA 0.2  0.0 - 1.0 mg/dL   Nitrite NEGATIVE  NEGATIVE   Leukocytes, UA NEGATIVE  NEGATIVE  URINE MICROSCOPIC-ADD ON     Status: Abnormal   Collection Time    05/21/13  1:51 PM      Result Value Range   Squamous Epithelial / LPF MANY (*) RARE   WBC, UA 0-2  <3 WBC/hpf   RBC / HPF 3-6  <3 RBC/hpf   Bacteria, UA FEW (*) RARE  GLUCOSE, CAPILLARY     Status: Abnormal   Collection Time    05/21/13  5:41 PM      Result Value Range   Glucose-Capillary 362 (*) 70 - 99 mg/dL    Radiological Exams on Admission: Dg Chest 2 View  05/21/2013   CLINICAL DATA:  Cough and shortness of breath  EXAM: CHEST  2 VIEW  COMPARISON:  March 21, 2013  FINDINGS: There is patchy left lower lobe infiltrate. Lungs are otherwise clear. Heart size and pulmonary vascularity are normal. No adenopathy. No bone lesions.  IMPRESSION: Patchy left lower lobe infiltrate.   Electronically Signed   By: Lowella Grip M.D.   On: 05/21/2013 14:49   US Abdomen Limited Ruq  05/21/2013   CLINICAL DATA:  Right upper quadrant pain for 3 weeks.  EXAM: US ABDOMEN LIMITED - RIGHT UPPER QUADRANT  COMPARISON:  None.  FINDINGS: Gallbladder  Within the right upper quadrant, the wall -echo -shadow complex is noted, consistent with gallbladder filled with stones. Gallbladder wall is  3.1 mm in thickness. No definite pericholecystic fluid identified. No sonographic Murphy sign.  Common bile duct  Diameter: 4.1 mm  Liver:  No focal lesion identified. Within normal limits in parenchymal echogenicity.  IMPRESSION: 1. Numerous gallstones. 2. Thickened gallbladder wall. 3. No definite Murphy sign. However, the findings are suspicious for cholecystitis.   Electronically Signed   By: Shon Hale M.D.   On: 05/21/2013 14:55    Assessment/Plan Active Problems:   Abdominal pain, left upper quadrant   Gall stones   Type II or unspecified type diabetes mellitus with neurological manifestations, not stated as uncontrolled(250.60)  Pneumonia  42 year old female coming with left upper quadrant abdominal pain, chest x-ray shows left lower lobe infiltrate consistent with pneumonia. She also has gallstones as per ultrasound though Murphy sign is negative in the location of pain is left upper quadrant. Patient will be started on IV Levaquin and Flagyl which should cover both suspected cholecystitis as well as possible pneumonia. Will get surgical consultation in a.m. to discuss for possible surgery. In the meantime I will start the patient on full liquid diet and keep her n.p.o. after midnight. The recent IV antibiotics use and change in her bowel habits I will start her on probiotic Florastor.  Patient's left upper quadrant pain could be due to left lower lobe infiltrate. We'll give patient morphine when necessary for pain.  We'll hold metformin in anticipation of her surgery and start sliding scale insulin. Patient's blood glucose is elevated so I also gave her Lantus at her home dose. She does have mildly elevated lipase of 123. Will check lipase in am.? Mild pancreatitis  Code status: Full code  Family discussion: Discussed with patient in detail   Time Spent on Admission: 75 min  [facility] Pager: [phone] 05/21/2013, 8:33 PM  If 7PM-7AM, please contact  night-coverage  [email or web address]  Password [email or web address]

## 2013-05-22 DIAGNOSIS — R7309 Other abnormal glucose: Secondary | ICD-10-CM

## 2013-05-22 DIAGNOSIS — K8020 Calculus of gallbladder without cholecystitis without obstruction: Secondary | ICD-10-CM

## 2013-05-22 LAB — COMPREHENSIVE METABOLIC PANEL
AST: 13 U/L (ref 0–37)
Albumin: 2.7 g/dL — ABNORMAL LOW (ref 3.5–5.2)
Alkaline Phosphatase: 69 U/L (ref 39–117)
BUN: 17 mg/dL (ref 6–23)
CO2: 26 mEq/L (ref 19–32)
Chloride: 98 mEq/L (ref 96–112)
Creatinine, Ser: 1.13 mg/dL — ABNORMAL HIGH (ref 0.50–1.10)
GFR calc Af Amer: 68 mL/min — ABNORMAL LOW (ref >90)
GFR calc non Af Amer: 59 mL/min — ABNORMAL LOW (ref >90)
Glucose, Bld: 318 mg/dL — ABNORMAL HIGH (ref 70–99)
Potassium: 4.3 mEq/L (ref 3.5–5.1)
Total Bilirubin: 0.2 mg/dL — ABNORMAL LOW (ref 0.3–1.2)

## 2013-05-22 LAB — HEMOGLOBIN A1C
Hgb A1c MFr Bld: 11.1 % — ABNORMAL HIGH (ref <5.7)
Mean Plasma Glucose: 272 mg/dL — ABNORMAL HIGH (ref <117)

## 2013-05-22 LAB — CBC
HCT: 32.3 % — ABNORMAL LOW (ref 36.0–46.0)
Hemoglobin: 11.2 g/dL — ABNORMAL LOW (ref 12.0–15.0)
MCV: 86.6 fL (ref 78.0–100.0)
Platelets: 280 10*3/uL (ref 150–400)
RBC: 3.73 MIL/uL — ABNORMAL LOW (ref 3.87–5.11)
RDW: 13.2 % (ref 11.5–15.5)
WBC: 9.4 10*3/uL (ref 4.0–10.5)

## 2013-05-22 LAB — GLUCOSE, CAPILLARY
Glucose-Capillary: 220 mg/dL — ABNORMAL HIGH (ref 70–99)
Glucose-Capillary: 114 mg/dL — ABNORMAL HIGH (ref 70–99)
Glucose-Capillary: 180 mg/dL — ABNORMAL HIGH (ref 70–99)
Glucose-Capillary: 239 mg/dL — ABNORMAL HIGH (ref 70–99)

## 2013-05-22 MED ORDER — INSULIN GLARGINE 100 UNIT/ML ~~LOC~~ SOLN
50.0000 [IU] | Freq: Every day | SUBCUTANEOUS | Status: DC
  Administered 2013-05-22: 50 [IU] via SUBCUTANEOUS
  Filled 2013-05-22 (×3): qty 0.5

## 2013-05-22 MED ORDER — ONDANSETRON HCL 4 MG/2ML IJ SOLN
INTRAMUSCULAR | Status: AC
  Filled 2013-05-22: qty 2

## 2013-05-22 MED ORDER — ONDANSETRON HCL 4 MG/2ML IJ SOLN
4.0000 mg | Freq: Four times a day (QID) | INTRAMUSCULAR | Status: DC | PRN
  Administered 2013-05-22 – 2013-05-25 (×4): 4 mg via INTRAVENOUS
  Filled 2013-05-22 (×3): qty 2

## 2013-05-22 MED ORDER — INSULIN ASPART 100 UNIT/ML ~~LOC~~ SOLN
5.0000 [IU] | Freq: Three times a day (TID) | SUBCUTANEOUS | Status: DC
  Administered 2013-05-22 – 2013-05-23 (×3): 5 [IU] via SUBCUTANEOUS

## 2013-05-22 MED ORDER — ALBUTEROL SULFATE (5 MG/ML) 0.5% IN NEBU
5.0000 mg | INHALATION_SOLUTION | RESPIRATORY_TRACT | Status: DC | PRN
  Filled 2013-05-22: qty 1

## 2013-05-22 NOTE — Care Management Note (Addendum)
    Page 1 of 1   05/26/2013     3:05:51 PM   CARE MANAGEMENT NOTE 05/26/2013  Patient:  Kara Knapp, Kara Knapp   Account Number:  [id number]  Date Initiated:  05/22/2013  Documentation initiated by:  Theophilus Kinds  Subjective/Objective Assessment:   Pt admitted from home with pneumonia and gallstones. Pt lives alone but has Knapp son that is in and out of the home. Pt is independent with aDL's. Pt for surgery on 10/27.     Action/Plan:   No CM needs noted.   Anticipated DC Date:     Anticipated DC Plan:  HOME/SELF CARE      DC Planning Services  CM consult      Choice offered to / List presented to:             Status of service:  Completed, signed off Medicare Important Message given?   (If response is "NO", the following Medicare IM given date fields will be blank) Date Medicare IM given:   Date Additional Medicare IM given:    Discharge Disposition:  HOME/SELF CARE  Per UR Regulation:    If discussed at Long Length of Stay Meetings, dates discussed:   05/26/2013    Comments:  05/26/13 [place], RN BSN CM Pt discharged home today. Followup appts made and documented on AVS. No other CM needs noted.  05/22/13 [place], RN BSN CM

## 2013-05-22 NOTE — Consult Note (Signed)
Reason for Consult: Upper abdominal pain, cholelithiasis Referring Physician: [facility]  Kara Knapp is an 42 y.o. female.  HPI: Patient is a 42 year old white female with uncontrolled diabetes mellitus who presented emergency room with a several week history of intermittent upper abdominal pain which would radiate both to the left into the right of the midline. Patient is a poor historian and was difficult to certain exactly what brought this pain on. She currently states that she has left upper quadrant abdominal pain, though she states she definitely has had right upper quadrant abdominal pain in the past. She does describe nausea. Ultrasound of gallbladder revealed a thickened gallbladder wall, multiple gallstones present, normal common bile duct. She was noted to have a blood sugar around 500. She was admitted by the hospitalist for control of her diabetes.  Past Medical History  Diagnosis Date  . Diabetes mellitus   . Neuropathy   . Asthma   . Hypertension   . Osteomyelitis     left middle toe     Past Surgical History  Procedure Laterality Date  . Cesarean section    . Tubal ligation      Family History  Problem Relation Age of Onset  . COPD Mother   . Cancer Father   . Diabetes Sister   . Diabetes Brother     Social History:  reports that she has been smoking Cigarettes.  She has a 10 pack-year smoking history. She does not have any smokeless tobacco history on file. She reports that she does not drink alcohol or use illicit drugs.  Allergies:  Allergies  Allergen Reactions  . Bactrim [Sulfamethoxazole-Tmp Ds] Nausea And Vomiting    Medications: I have reviewed the patient's current medications.  Results for orders placed during the hospital encounter of 05/21/13 (from the past 48 hour(s))  CBC WITH DIFFERENTIAL     Status: Abnormal   Collection Time    05/21/13  1:05 PM      Result Value Range   WBC 8.5  4.0 - 10.5 K/uL   RBC 4.04  3.87 - 5.11  MIL/uL   Hemoglobin 12.2  12.0 - 15.0 g/dL   HCT 34.7 (*) 36.0 - 46.0 %   MCV 85.9  78.0 - 100.0 fL   MCH 30.2  26.0 - 34.0 pg   MCHC 35.2  30.0 - 36.0 g/dL   RDW 13.0  11.5 - 15.5 %   Platelets 293  150 - 400 K/uL   Neutrophils Relative % 69  43 - 77 %   Neutro Abs 5.8  1.7 - 7.7 K/uL   Lymphocytes Relative 23  12 - 46 %   Lymphs Abs 2.0  0.7 - 4.0 K/uL   Monocytes Relative 6  3 - 12 %   Monocytes Absolute 0.5  0.1 - 1.0 K/uL   Eosinophils Relative 1  0 - 5 %   Eosinophils Absolute 0.1  0.0 - 0.7 K/uL   Basophils Relative 1  0 - 1 %   Basophils Absolute 0.1  0.0 - 0.1 K/uL  COMPREHENSIVE METABOLIC PANEL     Status: Abnormal   Collection Time    05/21/13  1:05 PM      Result Value Range   Sodium 132 (*) 135 - 145 mEq/L   Potassium 4.4  3.5 - 5.1 mEq/L   Chloride 95 (*) 96 - 112 mEq/L   CO2 27  19 - 32 mEq/L   Glucose, Bld 465 (*) 70 - 99  mg/dL   BUN 18  6 - 23 mg/dL   Creatinine, Ser 1.18 (*) 0.50 - 1.10 mg/dL   Calcium 9.7  8.4 - 10.5 mg/dL   Total Protein 6.8  6.0 - 8.3 g/dL   Albumin 3.1 (*) 3.5 - 5.2 g/dL   AST 11  0 - 37 U/L   ALT 9  0 - 35 U/L   Alkaline Phosphatase 98  39 - 117 U/L   Total Bilirubin 0.1 (*) 0.3 - 1.2 mg/dL   GFR calc non Af Amer 56 (*) >90 mL/min   GFR calc Af Amer 64 (*) >90 mL/min   Comment: (NOTE)     The eGFR has been calculated using the CKD EPI equation.     This calculation has not been validated in all clinical situations.     eGFR's persistently <90 mL/min signify possible Chronic Kidney     Disease.  LIPASE, BLOOD     Status: Abnormal   Collection Time    05/21/13  1:05 PM      Result Value Range   Lipase 123 (*) 11 - 59 U/L  URINALYSIS, ROUTINE W REFLEX MICROSCOPIC     Status: Abnormal   Collection Time    05/21/13  1:51 PM      Result Value Range   Color, Urine YELLOW  YELLOW   APPearance CLEAR  CLEAR   Specific Gravity, Urine 1.020  1.005 - 1.030   pH 6.0  5.0 - 8.0   Glucose, UA >1000 (*) NEGATIVE mg/dL   Hgb urine  dipstick SMALL (*) NEGATIVE   Bilirubin Urine NEGATIVE  NEGATIVE   Ketones, ur NEGATIVE  NEGATIVE mg/dL   Protein, ur 100 (*) NEGATIVE mg/dL   Urobilinogen, UA 0.2  0.0 - 1.0 mg/dL   Nitrite NEGATIVE  NEGATIVE   Leukocytes, UA NEGATIVE  NEGATIVE  URINE MICROSCOPIC-ADD ON     Status: Abnormal   Collection Time    05/21/13  1:51 PM      Result Value Range   Squamous Epithelial / LPF MANY (*) RARE   WBC, UA 0-2  <3 WBC/hpf   RBC / HPF 3-6  <3 RBC/hpf   Bacteria, UA FEW (*) RARE  GLUCOSE, CAPILLARY     Status: Abnormal   Collection Time    05/21/13  5:41 PM      Result Value Range   Glucose-Capillary 362 (*) 70 - 99 mg/dL  GLUCOSE, CAPILLARY     Status: Abnormal   Collection Time    05/21/13 11:06 PM      Result Value Range   Glucose-Capillary 345 (*) 70 - 99 mg/dL   Comment 1 Notify RN    LIPASE, BLOOD     Status: None   Collection Time    05/22/13  5:15 AM      Result Value Range   Lipase 17  11 - 59 U/L  CBC     Status: Abnormal   Collection Time    05/22/13  5:15 AM      Result Value Range   WBC 9.4  4.0 - 10.5 K/uL   RBC 3.73 (*) 3.87 - 5.11 MIL/uL   Hemoglobin 11.2 (*) 12.0 - 15.0 g/dL   HCT 32.3 (*) 36.0 - 46.0 %   MCV 86.6  78.0 - 100.0 fL   MCH 30.0  26.0 - 34.0 pg   MCHC 34.7  30.0 - 36.0 g/dL   RDW 13.2  11.5 - 15.5 %   Platelets  280  150 - 400 K/uL  COMPREHENSIVE METABOLIC PANEL     Status: Abnormal   Collection Time    05/22/13  5:15 AM      Result Value Range   Sodium 133 (*) 135 - 145 mEq/L   Potassium 4.3  3.5 - 5.1 mEq/L   Chloride 98  96 - 112 mEq/L   CO2 26  19 - 32 mEq/L   Glucose, Bld 318 (*) 70 - 99 mg/dL   BUN 17  6 - 23 mg/dL   Creatinine, Ser 1.13 (*) 0.50 - 1.10 mg/dL   Calcium 9.1  8.4 - 10.5 mg/dL   Total Protein 6.2  6.0 - 8.3 g/dL   Albumin 2.7 (*) 3.5 - 5.2 g/dL   AST 13  0 - 37 U/L   ALT 9  0 - 35 U/L   Alkaline Phosphatase 69  39 - 117 U/L   Total Bilirubin 0.2 (*) 0.3 - 1.2 mg/dL   GFR calc non Af Amer 59 (*) >90 mL/min    GFR calc Af Amer 68 (*) >90 mL/min   Comment: (NOTE)     The eGFR has been calculated using the CKD EPI equation.     This calculation has not been validated in all clinical situations.     eGFR's persistently <90 mL/min signify possible Chronic Kidney     Disease.  GLUCOSE, CAPILLARY     Status: Abnormal   Collection Time    05/22/13  7:54 AM      Result Value Range   Glucose-Capillary 220 (*) 70 - 99 mg/dL   Comment 1 Notify RN     Comment 2 Documented in Chart      Dg Chest 2 View  05/21/2013   CLINICAL DATA:  Cough and shortness of breath  EXAM: CHEST  2 VIEW  COMPARISON:  March 21, 2013  FINDINGS: There is patchy left lower lobe infiltrate. Lungs are otherwise clear. Heart size and pulmonary vascularity are normal. No adenopathy. No bone lesions.  IMPRESSION: Patchy left lower lobe infiltrate.   Electronically Signed   By: Lowella Grip M.D.   On: 05/21/2013 14:49   US Abdomen Limited Ruq  05/21/2013   CLINICAL DATA:  Right upper quadrant pain for 3 weeks.  EXAM: US ABDOMEN LIMITED - RIGHT UPPER QUADRANT  COMPARISON:  None.  FINDINGS: Gallbladder  Within the right upper quadrant, the wall -echo -shadow complex is noted, consistent with gallbladder filled with stones. Gallbladder wall is 3.1 mm in thickness. No definite pericholecystic fluid identified. No sonographic Murphy sign.  Common bile duct  Diameter: 4.1 mm  Liver:  No focal lesion identified. Within normal limits in parenchymal echogenicity.  IMPRESSION: 1. Numerous gallstones. 2. Thickened gallbladder wall. 3. No definite Murphy sign. However, the findings are suspicious for cholecystitis.   Electronically Signed   By: Shon Hale M.D.   On: 05/21/2013 14:55    ROS: See chart Blood pressure 154/94, pulse 75, temperature 98.3 F (36.8 C), temperature source Oral, resp. rate 18, height 5' 3"  (1.6 m), weight 81.647 kg (180 lb), last menstrual period 05/19/2013, SpO2 96.00%. Physical Exam: Pleasant white female in no acute  distress. Abdomen: Soft, minimal tenderness in the upper abdomen. Not splenomegaly, masses, or rigidity are noted.  Assessment/Plan: Impression: Cholecystitis, cholelithiasis, chemical pancreatitis, though it is difficult to certain whether she's got frank gallstone pancreatitis. She also is uncontrolled diabetes mellitus. Plan: She'll probably need her cholecystectomy done during this admission. We'll need to  control her blood sugars at this time. Temporarily schedule the patient for 05/25/2013.  Curtiss Mahmood A 05/22/2013, 10:59 AM

## 2013-05-22 NOTE — Progress Notes (Signed)
UR chart review completed.  

## 2013-05-22 NOTE — Progress Notes (Signed)
[facility] PROGRESS NOTE  Avyonna A Rodriques ATF:[id number] DOB: [birth year] DOA: 05/21/2013 PCP: No PCP Per Patient  Assessment/Plan: 1. Cholecystitis/Symptomatic cholelithiasis -start clears, Levaquin/flagyl -IVF, supportive care -appreciate surgical consult, surgery tentatively planned for Monday  2. LLL infiltrate/community acquired pneumonia -continue IV levaquin, albuterol PRN  3. Uncontrolled DM -check Hbaic, increase lantus -add novolog with meals  4. Suspected COPD/ongoing tobacco abuse -nebs PRN, stable -declines nicotine patch  5. H/o osteomyelitis of 3rd toe in L foot -completed 6 weeks of Abx >1 month ago, appears healed  DVT proph: lovenox  Code Status: Full Family Communication: none at bedside Disposition Plan: home when improved   Consultants:  Surgery dr.Jenkins  Antibiotics:  Levaquin/Flagyl  HPI/Subjective: Still having abdominal pain, wants to eat and wants crackers  Objective: Filed Vitals:   05/22/13 0557  BP: 154/94  Pulse: 75  Temp: 98.3 F (36.8 C)  Resp: 18    Intake/Output Summary (Last 24 hours) at 05/22/13 1127 Last data filed at 05/22/13 0950  Gross per 24 hour  Intake 1716.67 ml  Output    750 ml  Net 966.67 ml   Filed Weights   05/21/13 1117  Weight: 81.647 kg (180 lb)    Exam:   General:  AAOx3, no distress  Cardiovascular: S1S2/RRR  Respiratory: poor air movement b/l  Abdomen: soft, obese, Mild RUQ tenderness, no murphys sign, no rigidity or rebound  Musculoskeletal: no edema c/c   Data Reviewed: Basic Metabolic Panel:  Recent Labs Lab 05/21/13 1305 05/22/13 0515  NA 132* 133*  K 4.4 4.3  CL 95* 98  CO2 27 26  GLUCOSE 465* 318*  BUN 18 17  CREATININE 1.18* 1.13*  CALCIUM 9.7 9.1   Liver Function Tests:  Recent Labs Lab 05/21/13 1305 05/22/13 0515  AST 11 13  ALT 9 9  ALKPHOS 98 69  BILITOT 0.1* 0.2*  PROT 6.8 6.2  ALBUMIN 3.1* 2.7*    Recent Labs Lab 05/21/13 1305  05/22/13 0515  LIPASE 123* 17   No results found for this basename: AMMONIA,  in the last 168 hours CBC:  Recent Labs Lab 05/21/13 1305 05/22/13 0515  WBC 8.5 9.4  NEUTROABS 5.8  --   HGB 12.2 11.2*  HCT 34.7* 32.3*  MCV 85.9 86.6  PLT 293 280   Cardiac Enzymes: No results found for this basename: CKTOTAL, CKMB, CKMBINDEX, TROPONINI,  in the last 168 hours BNP (last 3 results) No results found for this basename: PROBNP,  in the last 8760 hours CBG:  Recent Labs Lab 05/21/13 1741 05/21/13 2306 05/22/13 0754  GLUCAP 362* 345* 220*    No results found for this or any previous visit (from the past 240 hour(s)).   Studies: Dg Chest 2 View  05/21/2013   CLINICAL DATA:  Cough and shortness of breath  EXAM: CHEST  2 VIEW  COMPARISON:  March 21, 2013  FINDINGS: There is patchy left lower lobe infiltrate. Lungs are otherwise clear. Heart size and pulmonary vascularity are normal. No adenopathy. No bone lesions.  IMPRESSION: Patchy left lower lobe infiltrate.   Electronically Signed   By: Lowella Grip M.D.   On: 05/21/2013 14:49   US Abdomen Limited Ruq  05/21/2013   CLINICAL DATA:  Right upper quadrant pain for 3 weeks.  EXAM: US ABDOMEN LIMITED - RIGHT UPPER QUADRANT  COMPARISON:  None.  FINDINGS: Gallbladder  Within the right upper quadrant, the wall -echo -shadow complex is noted, consistent with gallbladder filled with stones. Gallbladder wall  is 3.1 mm in thickness. No definite pericholecystic fluid identified. No sonographic Murphy sign.  Common bile duct  Diameter: 4.1 mm  Liver:  No focal lesion identified. Within normal limits in parenchymal echogenicity.  IMPRESSION: 1. Numerous gallstones. 2. Thickened gallbladder wall. 3. No definite Murphy sign. However, the findings are suspicious for cholecystitis.   Electronically Signed   By: Shon Hale M.D.   On: 05/21/2013 14:55    Scheduled Meds: . enoxaparin (LOVENOX) injection  40 mg Subcutaneous Q24H  . gabapentin   600 mg Oral Q8H  . influenza vac split quadrivalent PF  0.5 mL Intramuscular Tomorrow-1000  . insulin aspart  0-9 Units Subcutaneous TID WC  . insulin aspart  5 Units Subcutaneous TID WC  . insulin glargine  50 Units Subcutaneous QHS  . levofloxacin (LEVAQUIN) IV  750 mg Intravenous Q24H  . lisinopril  10 mg Oral Daily  . metronidazole  500 mg Intravenous Q8H  . pneumococcal 23 valent vaccine  0.5 mL Intramuscular Tomorrow-1000  . saccharomyces boulardii  250 mg Oral BID  . sodium chloride  3 mL Intravenous Q12H   Continuous Infusions: . sodium chloride 100 mL/hr at 05/22/13 0213    Active Problems:   Abdominal pain, left upper quadrant   Gall stones   Type II or unspecified type diabetes mellitus with neurological manifestations, not stated as uncontrolled(250.60)    Time spent: 76mn    Kattleya Kuhnert  [facility] Pager [phone] If 7PM-7AM, please contact night-coverage at [email or web address], password [email or web address] Health[facility]10/24/2014, 11:27 AM  LOS: 1 day

## 2013-05-23 LAB — CBC
Hemoglobin: 11.1 g/dL — ABNORMAL LOW (ref 12.0–15.0)
MCH: 30 pg (ref 26.0–34.0)
MCHC: 34.2 g/dL (ref 30.0–36.0)
RDW: 13.3 % (ref 11.5–15.5)
WBC: 6.8 10*3/uL (ref 4.0–10.5)

## 2013-05-23 LAB — COMPREHENSIVE METABOLIC PANEL
ALT: 8 U/L (ref 0–35)
Alkaline Phosphatase: 64 U/L (ref 39–117)
BUN: 11 mg/dL (ref 6–23)
CO2: 26 mEq/L (ref 19–32)
Calcium: 9 mg/dL (ref 8.4–10.5)
Chloride: 104 mEq/L (ref 96–112)
GFR calc Af Amer: 74 mL/min — ABNORMAL LOW (ref >90)
GFR calc non Af Amer: 64 mL/min — ABNORMAL LOW (ref >90)
Glucose, Bld: 207 mg/dL — ABNORMAL HIGH (ref 70–99)
Sodium: 137 mEq/L (ref 135–145)
Total Protein: 5.6 g/dL — ABNORMAL LOW (ref 6.0–8.3)

## 2013-05-23 LAB — GLUCOSE, CAPILLARY
Glucose-Capillary: 188 mg/dL — ABNORMAL HIGH (ref 70–99)
Glucose-Capillary: 158 mg/dL — ABNORMAL HIGH (ref 70–99)
Glucose-Capillary: 112 mg/dL — ABNORMAL HIGH (ref 70–99)
Glucose-Capillary: 133 mg/dL — ABNORMAL HIGH (ref 70–99)

## 2013-05-23 MED ORDER — INSULIN GLARGINE 100 UNIT/ML ~~LOC~~ SOLN
55.0000 [IU] | Freq: Every day | SUBCUTANEOUS | Status: DC
  Administered 2013-05-23: 55 [IU] via SUBCUTANEOUS
  Filled 2013-05-23 (×2): qty 0.55

## 2013-05-23 MED ORDER — INSULIN ASPART 100 UNIT/ML ~~LOC~~ SOLN
6.0000 [IU] | Freq: Three times a day (TID) | SUBCUTANEOUS | Status: DC
  Administered 2013-05-23 – 2013-05-26 (×8): 6 [IU] via SUBCUTANEOUS

## 2013-05-23 NOTE — Progress Notes (Signed)
Subjective: No nausea or vomiting. Abdominal pain seems to use.Marland Kitchen Has no productive cough or shortness of breath.  Objective: Vital signs in last 24 hours: Temp:  [98.1 F (36.7 C)-98.8 F (37.1 C)] 98.8 F (37.1 C) (10/25 0538) Pulse Rate:  [77-86] 77 (10/25 0801) Resp:  [17-18] 17 (10/25 0801) BP: (127-181)/(82-107) 162/82 mmHg (10/25 0546) SpO2:  [93 %-96 %] 94 % (10/25 0801) Last BM Date: 05/21/13  Intake/Output from previous day: 10/24 0701 - 10/25 0700 In: 462 [P.O.:462] Out: -  Intake/Output this shift:    General appearance: alert, cooperative and no distress Resp: clear to auscultation bilaterally and I do not appreciate any rhonchi or wheezing. GI: soft, non-tender; bowel sounds normal; no masses,  no organomegaly  Lab Results:   Recent Labs  05/22/13 0515 05/23/13 0628  WBC 9.4 6.8  HGB 11.2* 11.1*  HCT 32.3* 32.5*  PLT 280 265   BMET  Recent Labs  05/22/13 0515 05/23/13 0628  NA 133* 137  K 4.3 3.7  CL 98 104  CO2 26 26  GLUCOSE 318* 207*  BUN 17 11  CREATININE 1.13* 1.05  CALCIUM 9.1 9.0   PT/INR No results found for this basename: LABPROT, INR,  in the last 72 hours  Studies/Results: Dg Chest 2 View  05/21/2013   CLINICAL DATA:  Cough and shortness of breath  EXAM: CHEST  2 VIEW  COMPARISON:  March 21, 2013  FINDINGS: There is patchy left lower lobe infiltrate. Lungs are otherwise clear. Heart size and pulmonary vascularity are normal. No adenopathy. No bone lesions.  IMPRESSION: Patchy left lower lobe infiltrate.   Electronically Signed   By: Lowella Grip M.D.   On: 05/21/2013 14:49   US Abdomen Limited Ruq  05/21/2013   CLINICAL DATA:  Right upper quadrant pain for 3 weeks.  EXAM: US ABDOMEN LIMITED - RIGHT UPPER QUADRANT  COMPARISON:  None.  FINDINGS: Gallbladder  Within the right upper quadrant, the wall -echo -shadow complex is noted, consistent with gallbladder filled with stones. Gallbladder wall is 3.1 mm in thickness. No  definite pericholecystic fluid identified. No sonographic Murphy sign.  Common bile duct  Diameter: 4.1 mm  Liver:  No focal lesion identified. Within normal limits in parenchymal echogenicity.  IMPRESSION: 1. Numerous gallstones. 2. Thickened gallbladder wall. 3. No definite Murphy sign. However, the findings are suspicious for cholecystitis.   Electronically Signed   By: Shon Hale M.D.   On: 05/21/2013 14:55    Anti-infectives: Anti-infectives   Start     Dose/Rate Route Frequency Ordered Stop   05/22/13 1900  levofloxacin (LEVAQUIN) IVPB 750 mg     750 mg 100 mL/hr over 90 Minutes Intravenous Every 24 hours 05/21/13 2127     05/22/13 0000  metroNIDAZOLE (FLAGYL) IVPB 500 mg     500 mg 100 mL/hr over 60 Minutes Intravenous Every 8 hours 05/21/13 2053     05/21/13 1515  levofloxacin (LEVAQUIN) IVPB 750 mg     750 mg 100 mL/hr over 90 Minutes Intravenous  Once 05/21/13 1512 05/21/13 1904   05/21/13 1515  metroNIDAZOLE (FLAGYL) IVPB 500 mg     500 mg 100 mL/hr over 60 Minutes Intravenous  Once 05/21/13 1512 05/21/13 1724      Assessment/Plan: Impression: Cholecystitis, cholelithiasis, question left lower lobe infiltrate. Patient's white blood cell count has remained within normal limits. The patient denies any productive cough. I do think she did have biliary colic secondary to cholelithiasis. There is a history of  biliary colic as an outpatient. We'll proceed with laparoscopic cholecystectomy on 05/25/2013. The risks and benefits of the procedure including bleeding, infection, hepatobiliary injury, the possibility of an open procedure were fully explained to the patient, who gave informed consent.  LOS: 2 days    Kara Knapp A 05/23/2013

## 2013-05-23 NOTE — Progress Notes (Signed)
[facility] PROGRESS NOTE  Kara Knapp GYK:[id number] DOB: [birth year] DOA: 05/21/2013 PCP: No PCP Per Patient  Assessment/Plan: 1. Cholecystitis/Symptomatic cholelithiasis -stable, improving -advance to full liquids, continue IV Levaquin/flagyl Day2 -IVF, supportive care -appreciate surgical consult, surgery tentatively planned for Monday  2. LLL infiltrate/suspected community acquired pneumonia -continue IV levaquin, albuterol PRN  3. Uncontrolled DM -Hbaic 11.1, increase lantus -increase novolog with meals  4. Suspected COPD/ongoing tobacco abuse -nebs PRN, stable -declines nicotine patch  5. H/o osteomyelitis of 3rd toe in L foot -completed 6 weeks of Abx >1 month ago, appears healed  DVT proph: lovenox  Code Status: Full Family Communication: none at bedside Disposition Plan: home when improved   Consultants:  Surgery dr.Jenkins  Antibiotics:  Levaquin/Flagyl  HPI/Subjective: Feel better, abdominal pain, improved, no further N/V, cough better  Objective: Filed Vitals:   05/23/13 0801  BP:   Pulse: 77  Temp:   Resp: 17    Intake/Output Summary (Last 24 hours) at 05/23/13 1409 Last data filed at 05/23/13 0902  Gross per 24 hour  Intake 3165.33 ml  Output      0 ml  Net 3165.33 ml   Filed Weights   05/21/13 1117  Weight: 81.647 kg (180 lb)    Exam:   General:  AAOx3, no distress  Cardiovascular: S1S2/RRR  Respiratory: poor air movement b/l  Abdomen: soft, obese, Mild RUQ tenderness, no murphys sign, no rigidity or rebound  Musculoskeletal: no edema c/c   Data Reviewed: Basic Metabolic Panel:  Recent Labs Lab 05/21/13 1305 05/22/13 0515 05/23/13 0628  NA 132* 133* 137  K 4.4 4.3 3.7  CL 95* 98 104  CO2 27 26 26   GLUCOSE 465* 318* 207*  BUN 18 17 11   CREATININE 1.18* 1.13* 1.05  CALCIUM 9.7 9.1 9.0   Liver Function Tests:  Recent Labs Lab 05/21/13 1305 05/22/13 0515 05/23/13 0628  AST 11 13 11   ALT 9 9 8    ALKPHOS 98 69 64  BILITOT 0.1* 0.2* <0.1*  PROT 6.8 6.2 5.6*  ALBUMIN 3.1* 2.7* 2.4*    Recent Labs Lab 05/21/13 1305 05/22/13 0515  LIPASE 123* 17   No results found for this basename: AMMONIA,  in the last 168 hours CBC:  Recent Labs Lab 05/21/13 1305 05/22/13 0515 05/23/13 0628  WBC 8.5 9.4 6.8  NEUTROABS 5.8  --   --   HGB 12.2 11.2* 11.1*  HCT 34.7* 32.3* 32.5*  MCV 85.9 86.6 87.8  PLT 293 280 265   Cardiac Enzymes: No results found for this basename: CKTOTAL, CKMB, CKMBINDEX, TROPONINI,  in the last 168 hours BNP (last 3 results) No results found for this basename: PROBNP,  in the last 8760 hours CBG:  Recent Labs Lab 05/22/13 1140 05/22/13 1651 05/22/13 2140 05/23/13 0733 05/23/13 1150  GLUCAP 114* 180* 239* 188* 158*    No results found for this or any previous visit (from the past 240 hour(s)).   Studies: Dg Chest 2 View  05/21/2013   CLINICAL DATA:  Cough and shortness of breath  EXAM: CHEST  2 VIEW  COMPARISON:  March 21, 2013  FINDINGS: There is patchy left lower lobe infiltrate. Lungs are otherwise clear. Heart size and pulmonary vascularity are normal. No adenopathy. No bone lesions.  IMPRESSION: Patchy left lower lobe infiltrate.   Electronically Signed   By: Lowella Grip M.D.   On: 05/21/2013 14:49   US Abdomen Limited Ruq  05/21/2013   CLINICAL DATA:  Right upper quadrant  pain for 3 weeks.  EXAM: US ABDOMEN LIMITED - RIGHT UPPER QUADRANT  COMPARISON:  None.  FINDINGS: Gallbladder  Within the right upper quadrant, the wall -echo -shadow complex is noted, consistent with gallbladder filled with stones. Gallbladder wall is 3.1 mm in thickness. No definite pericholecystic fluid identified. No sonographic Murphy sign.  Common bile duct  Diameter: 4.1 mm  Liver:  No focal lesion identified. Within normal limits in parenchymal echogenicity.  IMPRESSION: 1. Numerous gallstones. 2. Thickened gallbladder wall. 3. No definite Murphy sign. However, the  findings are suspicious for cholecystitis.   Electronically Signed   By: Shon Hale M.D.   On: 05/21/2013 14:55    Scheduled Meds: . enoxaparin (LOVENOX) injection  40 mg Subcutaneous Q24H  . gabapentin  600 mg Oral Q8H  . insulin aspart  0-9 Units Subcutaneous TID WC  . insulin aspart  6 Units Subcutaneous TID WC  . insulin glargine  55 Units Subcutaneous QHS  . levofloxacin (LEVAQUIN) IV  750 mg Intravenous Q24H  . lisinopril  10 mg Oral Daily  . metronidazole  500 mg Intravenous Q8H  . saccharomyces boulardii  250 mg Oral BID  . sodium chloride  3 mL Intravenous Q12H   Continuous Infusions: . sodium chloride 100 mL/hr (05/23/13 0731)    Active Problems:   Abdominal pain, left upper quadrant   Gall stones   Type II or unspecified type diabetes mellitus with neurological manifestations, not stated as uncontrolled(250.60)    Time spent: 22mn    Kara Knapp  [facility] Pager [phone] If 7PM-7AM, please contact night-coverage at [email or web address], password [email or web address]10/25/2014, 2:09 PM  LOS: 2 days

## 2013-05-24 LAB — COMPREHENSIVE METABOLIC PANEL
ALT: 8 U/L (ref 0–35)
AST: 13 U/L (ref 0–37)
Alkaline Phosphatase: 60 U/L (ref 39–117)
CO2: 26 mEq/L (ref 19–32)
Chloride: 106 mEq/L (ref 96–112)
Creatinine, Ser: 1.05 mg/dL (ref 0.50–1.10)
GFR calc Af Amer: 74 mL/min — ABNORMAL LOW (ref >90)
GFR calc non Af Amer: 64 mL/min — ABNORMAL LOW (ref >90)
Glucose, Bld: 112 mg/dL — ABNORMAL HIGH (ref 70–99)
Potassium: 3.7 mEq/L (ref 3.5–5.1)
Sodium: 138 mEq/L (ref 135–145)
Total Bilirubin: <0.1 mg/dL — ABNORMAL LOW (ref 0.3–1.2)

## 2013-05-24 LAB — CBC
Hemoglobin: 11 g/dL — ABNORMAL LOW (ref 12.0–15.0)
MCH: 30 pg (ref 26.0–34.0)
MCHC: 34.3 g/dL (ref 30.0–36.0)
Platelets: 243 10*3/uL (ref 150–400)
RDW: 13.2 % (ref 11.5–15.5)

## 2013-05-24 LAB — GLUCOSE, CAPILLARY
Glucose-Capillary: 202 mg/dL — ABNORMAL HIGH (ref 70–99)
Glucose-Capillary: 223 mg/dL — ABNORMAL HIGH (ref 70–99)

## 2013-05-24 MED ORDER — INSULIN GLARGINE 100 UNIT/ML ~~LOC~~ SOLN
30.0000 [IU] | Freq: Every day | SUBCUTANEOUS | Status: DC
  Administered 2013-05-24: 30 [IU] via SUBCUTANEOUS
  Filled 2013-05-24 (×2): qty 0.3

## 2013-05-24 MED ORDER — CHLORHEXIDINE GLUCONATE 4 % EX LIQD
1.0000 "application " | Freq: Once | CUTANEOUS | Status: AC
  Administered 2013-05-24: 1 via TOPICAL
  Filled 2013-05-24: qty 15

## 2013-05-24 NOTE — Progress Notes (Signed)
[facility] PROGRESS NOTE  Kara Knapp QPR:[id number] DOB: 06-10-1971 DOA: 05/21/2013 PCP: No PCP Per Patient  Assessment/Plan: 1. Cholecystitis/Symptomatic cholelithiasis -stable, improving -advance to full liquids, continue IV Levaquin/flagyl Day3 -IVF, supportive care -appreciate surgical consult, surgery tentatively planned for Monday  2. LLL infiltrate/suspected community acquired pneumonia -continue IV levaquin, albuterol PRN  3. Uncontrolled DM -Hbaic 11.1, increase lantus -current blood sugars appear to be significantly improved -will give have the dose of lantus tonight, since patient will be npo for surgery after midnight  4. Suspected COPD/ongoing tobacco abuse -nebs PRN, stable -declines nicotine patch  5. H/o osteomyelitis of 3rd toe in L foot -completed 6 weeks of Abx >1 month ago, appears healed  DVT proph: lovenox  Code Status: Full Family Communication: none at bedside Disposition Plan: home when improved   Consultants:  Surgery dr.Jenkins  Antibiotics:  Levaquin/Flagyl  HPI/Subjective: No new complaints  Objective: Filed Vitals:   05/24/13 0509  BP: 152/97  Pulse: 79  Temp: 98.8 F (37.1 C)  Resp: 18    Intake/Output Summary (Last 24 hours) at 05/24/13 1156 Last data filed at 05/23/13 1846  Gross per 24 hour  Intake    480 ml  Output      0 ml  Net    480 ml   Filed Weights   05/21/13 1117  Weight: 81.647 kg (180 lb)    Exam:   General:  AAOx3, no distress  Cardiovascular: S1S2/RRR  Respiratory: poor air movement b/l  Abdomen: soft, obese, nontender, no murphys sign, no rigidity or rebound  Musculoskeletal: no edema c/c   Data Reviewed: Basic Metabolic Panel:  Recent Labs Lab 05/21/13 1305 05/22/13 0515 05/23/13 0628 05/24/13 0618  NA 132* 133* 137 138  K 4.4 4.3 3.7 3.7  CL 95* 98 104 106  CO2 27 26 26 26   GLUCOSE 465* 318* 207* 112*  BUN 18 17 11 7   CREATININE 1.18* 1.13* 1.05 1.05  CALCIUM  9.7 9.1 9.0 8.7   Liver Function Tests:  Recent Labs Lab 05/21/13 1305 05/22/13 0515 05/23/13 0628 05/24/13 0618  AST 11 13 11 13   ALT 9 9 8 8   ALKPHOS 98 69 64 60  BILITOT 0.1* 0.2* <0.1* <0.1*  PROT 6.8 6.2 5.6* 5.5*  ALBUMIN 3.1* 2.7* 2.4* 2.3*    Recent Labs Lab 05/21/13 1305 05/22/13 0515  LIPASE 123* 17   No results found for this basename: AMMONIA,  in the last 168 hours CBC:  Recent Labs Lab 05/21/13 1305 05/22/13 0515 05/23/13 0628 05/24/13 0618  WBC 8.5 9.4 6.8 5.0  NEUTROABS 5.8  --   --   --   HGB 12.2 11.2* 11.1* 11.0*  HCT 34.7* 32.3* 32.5* 32.1*  MCV 85.9 86.6 87.8 87.5  PLT 293 280 265 243   Cardiac Enzymes: No results found for this basename: CKTOTAL, CKMB, CKMBINDEX, TROPONINI,  in the last 168 hours BNP (last 3 results) No results found for this basename: PROBNP,  in the last 8760 hours CBG:  Recent Labs Lab 05/23/13 1649 05/23/13 2101 05/23/13 2253 05/24/13 0807 05/24/13 1128  GLUCAP 202* 112* 133* 76 110*    No results found for this or any previous visit (from the past 240 hour(s)).   Studies: No results found.  Scheduled Meds: . chlorhexidine  1 application Topical Once  . enoxaparin (LOVENOX) injection  40 mg Subcutaneous Q24H  . gabapentin  600 mg Oral Q8H  . insulin aspart  0-9 Units Subcutaneous TID WC  .  insulin aspart  6 Units Subcutaneous TID WC  . insulin glargine  55 Units Subcutaneous QHS  . levofloxacin (LEVAQUIN) IV  750 mg Intravenous Q24H  . lisinopril  10 mg Oral Daily  . metronidazole  500 mg Intravenous Q8H  . saccharomyces boulardii  250 mg Oral BID  . sodium chloride  3 mL Intravenous Q12H   Continuous Infusions: . sodium chloride 100 mL/hr at 05/24/13 0900    Active Problems:   Abdominal pain, left upper quadrant   Gall stones   Type II or unspecified type diabetes mellitus with neurological manifestations, not stated as uncontrolled(250.60)    Time spent:  74mn    Ramata Strothman  [facility] Pager [phone] If 7PM-7AM, please contact night-coverage at [email or web address], password [email or web address]10/26/2014, 11:56 AM  LOS: 3 days

## 2013-05-24 NOTE — Progress Notes (Signed)
ANTIBIOTIC CONSULT NOTE  Pharmacy Consult for Levaquin Indication: rule out pneumonia / Cholecystitis/Symptomatic cholelithiasis  Allergies  Allergen Reactions  . Bactrim [Sulfamethoxazole-Tmp Ds] Nausea And Vomiting    Patient Measurements: Height: 5' 3"  (160 cm) Weight: 180 lb (81.647 kg) IBW/kg (Calculated) : 52.4 Adjusted Body Weight:   Vital Signs: Temp: 98.8 F (37.1 C) (10/26 0509) Temp src: Oral (10/26 0509) BP: 152/97 mmHg (10/26 0509) Pulse Rate: 79 (10/26 0509) Intake/Output from previous day: 10/25 0701 - 10/26 0700 In: 3423.3 [P.O.:720; I.V.:2703.3] Out: -  Intake/Output from this shift:    Labs:  Recent Labs  05/22/13 0515 05/23/13 0628 05/24/13 0618  WBC 9.4 6.8 5.0  HGB 11.2* 11.1* 11.0*  PLT 280 265 243  CREATININE 1.13* 1.05 1.05   Estimated Creatinine Clearance: 69.9 ml/min (by C-G formula based on Cr of 1.05). No results found for this basename: VANCOTROUGH, VANCOPEAK, VANCORANDOM, GENTTROUGH, GENTPEAK, GENTRANDOM, TOBRATROUGH, TOBRAPEAK, TOBRARND, AMIKACINPEAK, AMIKACINTROU, AMIKACIN,  in the last 72 hours   Microbiology: No results found for this or any previous visit (from the past 720 hour(s)).  Medical History: Past Medical History  Diagnosis Date  . Diabetes mellitus   . Neuropathy   . Asthma   . Hypertension   . Osteomyelitis     left middle toe     Medications:  Scheduled:  . enoxaparin (LOVENOX) injection  40 mg Subcutaneous Q24H  . gabapentin  600 mg Oral Q8H  . insulin aspart  0-9 Units Subcutaneous TID WC  . insulin aspart  6 Units Subcutaneous TID WC  . insulin glargine  55 Units Subcutaneous QHS  . levofloxacin (LEVAQUIN) IV  750 mg Intravenous Q24H  . lisinopril  10 mg Oral Daily  . metronidazole  500 mg Intravenous Q8H  . saccharomyces boulardii  250 mg Oral BID  . sodium chloride  3 mL Intravenous Q12H   Assessment: Suspected pneumonia & Cholecystitis/Symptomatic cholelithiasis  Levaquin 10/23 >> Flagyl  10/23 >>  Goal of Therapy:  Eradicate infection  Plan:  Continue Levaquin 750 mg IV every 24 hours Labs per protocol  Pricilla Larsson 05/24/2013,9:42 AM

## 2013-05-24 NOTE — Progress Notes (Signed)
  Subjective: No abdominal pain currently. Does not have a productive cough.  Objective: Vital signs in last 24 hours: Temp:  [98.1 F (36.7 C)-98.8 F (37.1 C)] 98.8 F (37.1 C) (10/26 0509) Pulse Rate:  [79-91] 79 (10/26 0509) Resp:  [18] 18 (10/26 0509) BP: (144-178)/(86-97) 152/97 mmHg (10/26 0509) SpO2:  [94 %-98 %] 97 % (10/26 0509) Last BM Date: 05/21/13  Intake/Output from previous day: 10/25 0701 - 10/26 0700 In: 3423.3 [P.O.:720; I.V.:2703.3] Out: -  Intake/Output this shift:    General appearance: alert, cooperative and no distress Resp: clear to auscultation bilaterally GI: soft, non-tender; bowel sounds normal; no masses,  no organomegaly  Lab Results:   Recent Labs  05/23/13 0628 05/24/13 0618  WBC 6.8 5.0  HGB 11.1* 11.0*  HCT 32.5* 32.1*  PLT 265 243   BMET  Recent Labs  05/23/13 0628 05/24/13 0618  NA 137 138  K 3.7 3.7  CL 104 106  CO2 26 26  GLUCOSE 207* 112*  BUN 11 7  CREATININE 1.05 1.05  CALCIUM 9.0 8.7   PT/INR No results found for this basename: LABPROT, INR,  in the last 72 hours  Studies/Results: No results found.  Anti-infectives: Anti-infectives   Start     Dose/Rate Route Frequency Ordered Stop   05/22/13 1900  levofloxacin (LEVAQUIN) IVPB 750 mg     750 mg 100 mL/hr over 90 Minutes Intravenous Every 24 hours 05/21/13 2127     05/22/13 0000  metroNIDAZOLE (FLAGYL) IVPB 500 mg     500 mg 100 mL/hr over 60 Minutes Intravenous Every 8 hours 05/21/13 2053     05/21/13 1515  levofloxacin (LEVAQUIN) IVPB 750 mg     750 mg 100 mL/hr over 90 Minutes Intravenous  Once 05/21/13 1512 05/21/13 1904   05/21/13 1515  metroNIDAZOLE (FLAGYL) IVPB 500 mg     500 mg 100 mL/hr over 60 Minutes Intravenous  Once 05/21/13 1512 05/21/13 1724      Assessment/Plan: Impression: Cholecystitis, cholelithiasis Diabetes now under control. Clinically not showing signs of frank pneumonia. Plan: We'll proceed with laparoscopic  cholecystectomy tomorrow. The risks and benefits of the procedure including bleeding, infection, hepatobiliary injury, and the possibility of an open procedure were fully explained to the patient, who gave informed consent.  LOS: 3 days    Glendoris Nodarse A 05/24/2013

## 2013-05-25 ENCOUNTER — Encounter (HOSPITAL_COMMUNITY): Payer: Self-pay | Admitting: Anesthesiology

## 2013-05-25 ENCOUNTER — Encounter (HOSPITAL_COMMUNITY): Payer: Self-pay | Admitting: *Deleted

## 2013-05-25 ENCOUNTER — Encounter (HOSPITAL_COMMUNITY)
Admission: EM | Disposition: A | Discharge status: Home / Self Care | Payer: Self-pay | Source: Home / Self Care | Attending: Internal Medicine

## 2013-05-25 ENCOUNTER — Inpatient Hospital Stay (HOSPITAL_COMMUNITY): Payer: Medicaid Other | Admitting: Anesthesiology

## 2013-05-25 DIAGNOSIS — I10 Essential (primary) hypertension: Secondary | ICD-10-CM | POA: Diagnosis present

## 2013-05-25 HISTORY — PX: CHOLECYSTECTOMY: SHX55

## 2013-05-25 LAB — CBC
HCT: 30.9 % — ABNORMAL LOW (ref 36.0–46.0)
MCHC: 34.3 g/dL (ref 30.0–36.0)
Platelets: 228 10*3/uL (ref 150–400)
RDW: 13.4 % (ref 11.5–15.5)
WBC: 5.9 10*3/uL (ref 4.0–10.5)

## 2013-05-25 LAB — COMPREHENSIVE METABOLIC PANEL
ALT: 10 U/L (ref 0–35)
Alkaline Phosphatase: 71 U/L (ref 39–117)
CO2: 27 mEq/L (ref 19–32)
Chloride: 107 mEq/L (ref 96–112)
GFR calc Af Amer: 75 mL/min — ABNORMAL LOW (ref >90)
GFR calc non Af Amer: 65 mL/min — ABNORMAL LOW (ref >90)
Glucose, Bld: 258 mg/dL — ABNORMAL HIGH (ref 70–99)
Potassium: 3.7 mEq/L (ref 3.5–5.1)
Sodium: 140 mEq/L (ref 135–145)
Total Bilirubin: <0.1 mg/dL — ABNORMAL LOW (ref 0.3–1.2)

## 2013-05-25 LAB — GLUCOSE, CAPILLARY
Glucose-Capillary: 210 mg/dL — ABNORMAL HIGH (ref 70–99)
Glucose-Capillary: 210 mg/dL — ABNORMAL HIGH (ref 70–99)
Glucose-Capillary: 160 mg/dL — ABNORMAL HIGH (ref 70–99)

## 2013-05-25 SURGERY — LAPAROSCOPIC CHOLECYSTECTOMY
Anesthesia: General | Site: Abdomen | Wound class: Clean Contaminated

## 2013-05-25 MED ORDER — HYDRALAZINE HCL 20 MG/ML IJ SOLN
INTRAMUSCULAR | Status: AC
  Filled 2013-05-25: qty 1

## 2013-05-25 MED ORDER — LIDOCAINE HCL (PF) 1 % IJ SOLN
INTRAMUSCULAR | Status: AC
  Filled 2013-05-25: qty 5

## 2013-05-25 MED ORDER — ONDANSETRON HCL 4 MG PO TABS: 4.0000 mg | ORAL_TABLET | Freq: Four times a day (QID) | ORAL | Status: DC | PRN

## 2013-05-25 MED ORDER — HYDROMORPHONE HCL PF 1 MG/ML IJ SOLN
1.0000 mg | INTRAMUSCULAR | Status: DC | PRN
  Administered 2013-05-26: 1 mg via INTRAVENOUS
  Filled 2013-05-25 (×2): qty 1

## 2013-05-25 MED ORDER — LABETALOL HCL 5 MG/ML IV SOLN
10.0000 mg | Freq: Once | INTRAVENOUS | Status: AC
  Administered 2013-05-25: 11:00:00 via INTRAVENOUS

## 2013-05-25 MED ORDER — MIDAZOLAM HCL 2 MG/2ML IJ SOLN
1.0000 mg | INTRAMUSCULAR | Status: DC | PRN
  Administered 2013-05-25 (×2): 2 mg via INTRAVENOUS

## 2013-05-25 MED ORDER — INSULIN GLARGINE 100 UNIT/ML ~~LOC~~ SOLN
55.0000 [IU] | Freq: Every day | SUBCUTANEOUS | Status: DC
  Administered 2013-05-25: 55 [IU] via SUBCUTANEOUS
  Filled 2013-05-25 (×2): qty 0.55

## 2013-05-25 MED ORDER — NEOSTIGMINE METHYLSULFATE 1 MG/ML IJ SOLN
INTRAMUSCULAR | Status: DC | PRN
  Administered 2013-05-25: 2 mg via INTRAVENOUS

## 2013-05-25 MED ORDER — PROPOFOL 10 MG/ML IV EMUL
INTRAVENOUS | Status: AC
  Filled 2013-05-25: qty 20

## 2013-05-25 MED ORDER — ONDANSETRON HCL 4 MG/2ML IJ SOLN
4.0000 mg | Freq: Once | INTRAMUSCULAR | Status: AC
  Administered 2013-05-25: 4 mg via INTRAVENOUS

## 2013-05-25 MED ORDER — GLYCOPYRROLATE 0.2 MG/ML IJ SOLN
INTRAMUSCULAR | Status: AC
  Filled 2013-05-25: qty 2

## 2013-05-25 MED ORDER — LISINOPRIL 10 MG PO TABS
20.0000 mg | ORAL_TABLET | Freq: Every day | ORAL | Status: DC
  Administered 2013-05-26: 20 mg via ORAL
  Filled 2013-05-25: qty 2

## 2013-05-25 MED ORDER — FENTANYL CITRATE 0.05 MG/ML IJ SOLN
25.0000 ug | INTRAMUSCULAR | Status: AC
  Administered 2013-05-25 (×2): 25 ug via INTRAVENOUS

## 2013-05-25 MED ORDER — ENOXAPARIN SODIUM 40 MG/0.4ML ~~LOC~~ SOLN
40.0000 mg | SUBCUTANEOUS | Status: DC
  Administered 2013-05-26: 40 mg via SUBCUTANEOUS
  Filled 2013-05-25: qty 0.4

## 2013-05-25 MED ORDER — FENTANYL CITRATE 0.05 MG/ML IJ SOLN
25.0000 ug | INTRAMUSCULAR | Status: DC | PRN
  Administered 2013-05-25 (×4): 50 ug via INTRAVENOUS
  Filled 2013-05-25: qty 2

## 2013-05-25 MED ORDER — KETOROLAC TROMETHAMINE 30 MG/ML IJ SOLN
30.0000 mg | Freq: Once | INTRAMUSCULAR | Status: AC
  Administered 2013-05-25: 30 mg via INTRAVENOUS
  Filled 2013-05-25: qty 1

## 2013-05-25 MED ORDER — GLYCOPYRROLATE 0.2 MG/ML IJ SOLN
INTRAMUSCULAR | Status: DC | PRN
  Administered 2013-05-25: 0.4 mg via INTRAVENOUS

## 2013-05-25 MED ORDER — DEXTROSE 50 % IV SOLN
25.0000 mL | Freq: Once | INTRAVENOUS | Status: AC
  Administered 2013-05-25: 25 mL via INTRAVENOUS

## 2013-05-25 MED ORDER — BUPIVACAINE HCL (PF) 0.5 % IJ SOLN
INTRAMUSCULAR | Status: DC | PRN
  Administered 2013-05-25: 10 mL

## 2013-05-25 MED ORDER — FENTANYL CITRATE 0.05 MG/ML IJ SOLN
INTRAMUSCULAR | Status: AC
  Filled 2013-05-25: qty 2

## 2013-05-25 MED ORDER — LABETALOL HCL 5 MG/ML IV SOLN
INTRAVENOUS | Status: DC | PRN
  Administered 2013-05-25: 10 mg via INTRAVENOUS

## 2013-05-25 MED ORDER — OXYCODONE-ACETAMINOPHEN 5-325 MG PO TABS: 1.0000 | ORAL_TABLET | ORAL | Status: DC | PRN

## 2013-05-25 MED ORDER — DEXAMETHASONE SODIUM PHOSPHATE 4 MG/ML IJ SOLN
4.0000 mg | Freq: Once | INTRAMUSCULAR | Status: AC
  Administered 2013-05-25: 4 mg via INTRAVENOUS

## 2013-05-25 MED ORDER — CLONIDINE HCL 0.1 MG PO TABS
0.1000 mg | ORAL_TABLET | Freq: Once | ORAL | Status: AC
  Administered 2013-05-25: 0.1 mg via ORAL
  Filled 2013-05-25: qty 1

## 2013-05-25 MED ORDER — HEMOSTATIC AGENTS (NO CHARGE) OPTIME
TOPICAL | Status: DC | PRN
  Administered 2013-05-25: 2 via TOPICAL

## 2013-05-25 MED ORDER — ONDANSETRON HCL 4 MG/2ML IJ SOLN
INTRAMUSCULAR | Status: AC
  Filled 2013-05-25: qty 2

## 2013-05-25 MED ORDER — HYDRALAZINE HCL 25 MG PO TABS
25.0000 mg | ORAL_TABLET | Freq: Three times a day (TID) | ORAL | Status: DC
  Administered 2013-05-25 – 2013-05-26 (×2): 25 mg via ORAL
  Filled 2013-05-25 (×2): qty 1

## 2013-05-25 MED ORDER — HYDRALAZINE HCL 20 MG/ML IJ SOLN
5.0000 mg | INTRAMUSCULAR | Status: AC
  Administered 2013-05-25 (×4): 5 mg via INTRAVENOUS

## 2013-05-25 MED ORDER — ONDANSETRON HCL 4 MG/2ML IJ SOLN
4.0000 mg | Freq: Once | INTRAMUSCULAR | Status: AC | PRN
  Administered 2013-05-25: 4 mg via INTRAVENOUS
  Filled 2013-05-25: qty 2

## 2013-05-25 MED ORDER — ROCURONIUM BROMIDE 100 MG/10ML IV SOLN
INTRAVENOUS | Status: DC | PRN
  Administered 2013-05-25: 5 mg via INTRAVENOUS

## 2013-05-25 MED ORDER — DEXTROSE 50 % IV SOLN
INTRAVENOUS | Status: AC
  Filled 2013-05-25: qty 50

## 2013-05-25 MED ORDER — FENTANYL CITRATE 0.05 MG/ML IJ SOLN
INTRAMUSCULAR | Status: AC
  Filled 2013-05-25: qty 5

## 2013-05-25 MED ORDER — LACTATED RINGERS IV SOLN
INTRAVENOUS | Status: DC
  Administered 2013-05-25 (×2): via INTRAVENOUS

## 2013-05-25 MED ORDER — ROCURONIUM BROMIDE 50 MG/5ML IV SOLN
INTRAVENOUS | Status: AC
  Filled 2013-05-25: qty 1

## 2013-05-25 MED ORDER — BUPIVACAINE HCL (PF) 0.5 % IJ SOLN
INTRAMUSCULAR | Status: AC
  Filled 2013-05-25: qty 30

## 2013-05-25 MED ORDER — MIDAZOLAM HCL 2 MG/2ML IJ SOLN
INTRAMUSCULAR | Status: AC
  Filled 2013-05-25: qty 2

## 2013-05-25 MED ORDER — LIDOCAINE HCL (CARDIAC) 10 MG/ML IV SOLN
INTRAVENOUS | Status: DC | PRN
  Administered 2013-05-25: 20 mg via INTRAVENOUS

## 2013-05-25 MED ORDER — FENTANYL CITRATE 0.05 MG/ML IJ SOLN
INTRAMUSCULAR | Status: DC | PRN
  Administered 2013-05-25 (×4): 50 ug via INTRAVENOUS
  Administered 2013-05-25: 100 ug via INTRAVENOUS

## 2013-05-25 MED ORDER — DEXAMETHASONE SODIUM PHOSPHATE 4 MG/ML IJ SOLN
INTRAMUSCULAR | Status: AC
  Filled 2013-05-25: qty 1

## 2013-05-25 MED ORDER — SODIUM CHLORIDE 0.9 % IR SOLN
Status: DC | PRN
  Administered 2013-05-25: 1000 mL

## 2013-05-25 MED ORDER — PROPOFOL 10 MG/ML IV BOLUS
INTRAVENOUS | Status: DC | PRN
  Administered 2013-05-25: 50 mg via INTRAVENOUS
  Administered 2013-05-25: 150 mg via INTRAVENOUS

## 2013-05-25 MED ORDER — ONDANSETRON HCL 4 MG/2ML IJ SOLN
4.0000 mg | Freq: Four times a day (QID) | INTRAMUSCULAR | Status: DC | PRN
  Administered 2013-05-26: 4 mg via INTRAVENOUS
  Filled 2013-05-25: qty 2

## 2013-05-25 MED ORDER — LABETALOL HCL 5 MG/ML IV SOLN
INTRAVENOUS | Status: AC
  Filled 2013-05-25: qty 4

## 2013-05-25 SURGICAL SUPPLY — 43 items
APPLIER CLIP LAPSCP 10X32 DD (CLIP) ×2 IMPLANT
BAG HAMPER (MISCELLANEOUS) ×2 IMPLANT
CLOTH BEACON ORANGE TIMEOUT ST (SAFETY) ×2 IMPLANT
COVER LIGHT HANDLE STERIS (MISCELLANEOUS) ×4 IMPLANT
CUTTER LINEAR ENDO 35 ETS (STAPLE) ×2 IMPLANT
DECANTER SPIKE VIAL GLASS SM (MISCELLANEOUS) ×2 IMPLANT
DURAPREP 26ML APPLICATOR (WOUND CARE) ×2 IMPLANT
ELECT REM PT RETURN 9FT ADLT (ELECTROSURGICAL) ×2
ELECTRODE REM PT RTRN 9FT ADLT (ELECTROSURGICAL) ×1 IMPLANT
FILTER SMOKE EVAC LAPAROSHD (FILTER) ×2 IMPLANT
FORMALIN 10 PREFIL 120ML (MISCELLANEOUS) ×2 IMPLANT
GLOVE BIO SURGEON STRL SZ7.5 (GLOVE) ×2 IMPLANT
GLOVE BIOGEL PI IND STRL 7.0 (GLOVE) ×3 IMPLANT
GLOVE BIOGEL PI IND STRL 8 (GLOVE) ×1 IMPLANT
GLOVE BIOGEL PI INDICATOR 7.0 (GLOVE) ×3
GLOVE BIOGEL PI INDICATOR 8 (GLOVE) ×1
GLOVE ECLIPSE 6.5 STRL STRAW (GLOVE) ×4 IMPLANT
GLOVE EXAM NITRILE LRG STRL (GLOVE) ×2 IMPLANT
GLOVE OPTIFIT SS 6.5 STRL BRWN (GLOVE) ×2 IMPLANT
GOWN STRL REIN XL XLG (GOWN DISPOSABLE) ×8 IMPLANT
HEMOSTAT SNOW SURGICEL 2X4 (HEMOSTASIS) ×4 IMPLANT
INST SET LAPROSCOPIC AP (KITS) ×2 IMPLANT
IV NS IRRIG 3000ML ARTHROMATIC (IV SOLUTION) IMPLANT
KIT ROOM TURNOVER APOR (KITS) ×2 IMPLANT
MANIFOLD NEPTUNE II (INSTRUMENTS) ×2 IMPLANT
NEEDLE INSUFFLATION 14GA 120MM (NEEDLE) ×2 IMPLANT
NS IRRIG 1000ML POUR BTL (IV SOLUTION) ×2 IMPLANT
PACK LAP CHOLE LZT030E (CUSTOM PROCEDURE TRAY) ×2 IMPLANT
PAD ARMBOARD 7.5X6 YLW CONV (MISCELLANEOUS) ×2 IMPLANT
POUCH SPECIMEN RETRIEVAL 10MM (ENDOMECHANICALS) ×2 IMPLANT
SET BASIN LINEN APH (SET/KITS/TRAYS/PACK) ×2 IMPLANT
SET TUBE IRRIG SUCTION NO TIP (IRRIGATION / IRRIGATOR) IMPLANT
SLEEVE ENDOPATH XCEL 5M (ENDOMECHANICALS) ×2 IMPLANT
SPONGE GAUZE 2X2 8PLY STRL LF (GAUZE/BANDAGES/DRESSINGS) ×2 IMPLANT
STAPLER VISISTAT (STAPLE) ×2 IMPLANT
SUT VICRYL 0 UR6 27IN ABS (SUTURE) ×2 IMPLANT
TAPE CLOTH SURG 4X10 WHT LF (GAUZE/BANDAGES/DRESSINGS) ×2 IMPLANT
TROCAR ENDO BLADELESS 11MM (ENDOMECHANICALS) ×2 IMPLANT
TROCAR XCEL NON-BLD 5MMX100MML (ENDOMECHANICALS) ×2 IMPLANT
TROCAR XCEL UNIV SLVE 11M 100M (ENDOMECHANICALS) ×2 IMPLANT
TUBING INSUFFLATION (TUBING) ×2 IMPLANT
WARMER LAPAROSCOPE (MISCELLANEOUS) ×2 IMPLANT
YANKAUER SUCT 12FT TUBE ARGYLE (SUCTIONS) ×2 IMPLANT

## 2013-05-25 NOTE — Anesthesia Preprocedure Evaluation (Signed)
Anesthesia Evaluation  Patient identified by MRN, date of birth, ID band Patient awake    Reviewed: Allergy & Precautions, H&P , NPO status , Patient's Chart, lab work & pertinent test results  History of Anesthesia Complications Negative for: history of anesthetic complications  Airway Mallampati: II TM Distance: >3 FB Neck ROM: Full    Dental  (+) Poor Dentition, Missing, Chipped and Dental Advisory Given Extensive dental carries, multiple broken incisors.:   Pulmonary asthma , COPDCurrent Smoker,  breath sounds clear to auscultation        Cardiovascular hypertension, Pt. on medications Rhythm:Regular Rate:Normal     Neuro/Psych    GI/Hepatic   Endo/Other  diabetes, Type 2, Oral Hypoglycemic Agents and Insulin Dependent  Renal/GU      Musculoskeletal   Abdominal   Peds  Hematology  (+) Blood dyscrasia, anemia ,   Anesthesia Other Findings   Reproductive/Obstetrics                           Anesthesia Physical Anesthesia Plan  ASA: III  Anesthesia Plan: General   Post-op Pain Management:    Induction: Intravenous  Airway Management Planned: Oral ETT  Additional Equipment:   Intra-op Plan:   Post-operative Plan: Extubation in OR  Informed Consent: I have reviewed the patients History and Physical, chart, labs and discussed the procedure including the risks, benefits and alternatives for the proposed anesthesia with the patient or authorized representative who has indicated his/her understanding and acceptance.   Dental advisory given  Plan Discussed with:   Anesthesia Plan Comments:         Anesthesia Quick Evaluation

## 2013-05-25 NOTE — Progress Notes (Signed)
Diet decreased to a clear liquid diet. Patient is actively vomiting.

## 2013-05-25 NOTE — Anesthesia Procedure Notes (Signed)
Procedure Name: Intubation Date/Time: 05/25/2013 11:31 AM Performed by: Vista Deck Pre-anesthesia Checklist: Patient identified, Patient being monitored, Timeout performed, Emergency Drugs available and Suction available Patient Re-evaluated:Patient Re-evaluated prior to inductionOxygen Delivery Method: Circle System Utilized Preoxygenation: Pre-oxygenation with 100% oxygen Intubation Type: IV induction Ventilation: Mask ventilation without difficulty Laryngoscope Size: Miller and 2 Grade View: Grade I Tube type: Oral Tube size: 7.0 mm Number of attempts: 1 Airway Equipment and Method: stylet Placement Confirmation: ETT inserted through vocal cords under direct vision,  positive ETCO2 and breath sounds checked- equal and bilateral Secured at: 21 cm Tube secured with: Tape Dental Injury: Teeth and Oropharynx as per pre-operative assessment

## 2013-05-25 NOTE — Progress Notes (Signed)
[facility] PROGRESS NOTE  Kara Knapp ZOX:[id number] DOB: Mar 17, 1971 DOA: 05/21/2013 PCP: No PCP Per Patient  Assessment/Plan: 1. Cholecystitis/Symptomatic cholelithiasis -Status post laparoscopic cholecystectomy -stable, improving -continue IV Levaquin/flagyl Day4 -IVF, supportive care -Started on clear liquids due to vomiting  2. LLL infiltrate/suspected community acquired pneumonia -continue IV levaquin, albuterol PRN  3. Uncontrolled DM -Hbaic 11.1, increase lantus -current blood sugars appear to be significantly improved -Give regular dose of 55 units of Lantus tonight.  4. Suspected COPD/ongoing tobacco abuse -nebs PRN, stable -declines nicotine patch  5. H/o osteomyelitis of 3rd toe in L foot -completed 6 weeks of Abx >1 month ago, appears healed  6. Hypertension. -Patient reports only taking 10 mg of lisinopril as an outpatient. Her blood pressure is running uncontrolled in the hospital. We'll increase lisinopril 20 mg daily and add hydralazine. This will need further titration as an outpatient.  DVT proph: lovenox  Code Status: Full Family Communication: none at bedside Disposition Plan: home when improved, possibly tomorrow   Consultants:  Surgery dr.Jenkins  Antibiotics:  Levaquin/Flagyl  HPI/Subjective: Postoperative pain and abdomen. Feels nausea is improving. Lethargic from medications  Objective: Filed Vitals:   05/25/13 1449  BP: 187/93  Pulse: 89  Temp:   Resp:     Intake/Output Summary (Last 24 hours) at 05/25/13 1951 Last data filed at 05/25/13 1700  Gross per 24 hour  Intake   3040 ml  Output    520 ml  Net   2520 ml   Filed Weights   05/21/13 1117  Weight: 81.647 kg (180 lb)    Exam:   General:  AAOx3, no distress  Cardiovascular: S1S2/RRR  Respiratory: poor air movement b/l  Abdomen: soft, obese,   Musculoskeletal: no edema c/c   Data Reviewed: Basic Metabolic Panel:  Recent Labs Lab 05/21/13 1305  05/22/13 0515 05/23/13 0628 05/24/13 0618 05/25/13 0527  NA 132* 133* 137 138 140  K 4.4 4.3 3.7 3.7 3.7  CL 95* 98 104 106 107  CO2 27 26 26 26 27   GLUCOSE 465* 318* 207* 112* 258*  BUN 18 17 11 7 8   CREATININE 1.18* 1.13* 1.05 1.05 1.04  CALCIUM 9.7 9.1 9.0 8.7 8.8   Liver Function Tests:  Recent Labs Lab 05/21/13 1305 05/22/13 0515 05/23/13 0628 05/24/13 0618 05/25/13 0527  AST 11 13 11 13 15   ALT 9 9 8 8 10   ALKPHOS 98 69 64 60 71  BILITOT 0.1* 0.2* <0.1* <0.1* <0.1*  PROT 6.8 6.2 5.6* 5.5* 5.4*  ALBUMIN 3.1* 2.7* 2.4* 2.3* 2.3*    Recent Labs Lab 05/21/13 1305 05/22/13 0515  LIPASE 123* 17   No results found for this basename: AMMONIA,  in the last 168 hours CBC:  Recent Labs Lab 05/21/13 1305 05/22/13 0515 05/23/13 0628 05/24/13 0618 05/25/13 0527  WBC 8.5 9.4 6.8 5.0 5.9  NEUTROABS 5.8  --   --   --   --   HGB 12.2 11.2* 11.1* 11.0* 10.6*  HCT 34.7* 32.3* 32.5* 32.1* 30.9*  MCV 85.9 86.6 87.8 87.5 88.0  PLT 293 280 265 243 228   Cardiac Enzymes: No results found for this basename: CKTOTAL, CKMB, CKMBINDEX, TROPONINI,  in the last 168 hours BNP (last 3 results) No results found for this basename: PROBNP,  in the last 8760 hours CBG:  Recent Labs Lab 05/25/13 0718 05/25/13 1015 05/25/13 1252 05/25/13 1526 05/25/13 1629  GLUCAP 200* 75 127* 210* 210*    Recent Results (from the past  240 hour(s))  SURGICAL PCR SCREEN     Status: None   Collection Time    05/24/13 11:30 AM      Result Value Range Status   MRSA, PCR NEGATIVE  NEGATIVE Final   Staphylococcus aureus NEGATIVE  NEGATIVE Final   Comment:            The Xpert SA Assay (FDA     approved for NASAL specimens     in patients over 53 years of age),     is one component of     a comprehensive surveillance     program.  Test performance has     been validated by [employer] for patients greater     than or equal to 72 year old.     It is not intended     to diagnose  infection nor to     guide or monitor treatment.     Studies: No results found.  Scheduled Meds: . enoxaparin (LOVENOX) injection  40 mg Subcutaneous Q24H  . gabapentin  600 mg Oral Q8H  . hydrALAZINE  25 mg Oral TID  . insulin aspart  0-9 Units Subcutaneous TID WC  . insulin aspart  6 Units Subcutaneous TID WC  . insulin glargine  55 Units Subcutaneous QHS  . levofloxacin (LEVAQUIN) IV  750 mg Intravenous Q24H  . [START ON 05/26/2013] lisinopril  20 mg Oral Daily  . metronidazole  500 mg Intravenous Q8H  . saccharomyces boulardii  250 mg Oral BID  . sodium chloride  3 mL Intravenous Q12H   Continuous Infusions: . sodium chloride 100 mL/hr at 05/25/13 1457    Active Problems:   Abdominal pain, left upper quadrant   Gall stones   Type II or unspecified type diabetes mellitus with neurological manifestations, not stated as uncontrolled(250.60)    Time spent: 6mn    [facility] Pager [phone] If 7PM-7AM, please contact night-coverage at [email or web address], password T[facility]10/27/2014, 7:51 PM  LOS: 4 days

## 2013-05-25 NOTE — Op Note (Signed)
Patient:  Kara Knapp  DOB:  April 11, 1971  MRN:  [id number]   Preop Diagnosis:  Cholecystitis, cholelithiasis  Postop Diagnosis:  Same  Procedure:  Laparoscopic cholecystectomy  Surgeon:  Aviva Signs, M.D.  Anes:  General endotracheal  Indications:  Patient is a 42 year old white female with multiple medical problems who presents with cholecystitis secondary to cholelithiasis. The risks and benefits of the procedure including bleeding, infection, hepatobiliary injury, and the possibility of an open procedure were fully explained to the patient, who gave informed consent.  Procedure note:  The patient is placed the supine position. After induction of general endotracheal anesthesia, the abdomen was prepped and draped using usual sterile technique with DuraPrep. Surgical site confirmation was performed.  A supraumbilical incision was made down to the fascia. A Veress needle was introduced into the abdominal cavity and confirmation of placement was done using the saline drop test. The abdomen was then insufflated to 16 mm mercury pressure. An 11 mm trocar was introduced into the abdominal cavity under direct visualization without difficulty. The patient was placed in reverse Trendelenburg position and additional 11 mm trocar was placed the epigastric region and 5 mm trochars were placed the right upper quadrant and right flank regions. The liver was inspected and noted within normal limits. The gallbladder was noted to be contracted with a thickened gallbladder wall and a large stone within the lumen. The gallbladder was retracted in a dynamic fashion in order to expose the triangle of Calot. The cystic duct was first identified. The cystic artery was likewise identified. As the anatomy was somewhat distorted due to the contracted nature of the gallbladder, I used an Endo GIA to come across the cystic duct. The cystic artery was ligated and divided. The gallbladder had already been freed using a  dome down approach. It was removed from the operative field and sent to pathology for further examination. No abnormal bleeding or bile leakage was noted. Surgicel is placed the gallbladder fossa. All fluid and air were then evacuated from the abdominal cavity prior to removal of the trochars.  All wounds were irrigated with normal saline. All wounds were checked with 0.5% Sensorcaine. The supraumbilical fashion as well as epigastric fascia reapproximated using 0 Vicryl interrupted sutures. All skin incisions were closed using staples. Betadine ointment and dry sterile dressings were applied.  All tape and needle counts were correct the end of the procedure. The patient was extubated in the operating room and transferred to PACU in stable condition.  Complications:  None  EBL:  Minimal  Specimen:  Gallbladder

## 2013-05-25 NOTE — Progress Notes (Addendum)
Inpatient Diabetes Program Recommendations  AACE/ADA: New Consensus Statement on Inpatient Glycemic Control (2013)  Target Ranges:  Prepandial:   less than 140 mg/dL      Peak postprandial:   less than 180 mg/dL (1-2 hours)      Critically ill patients:  140 - 180 mg/dL   Results for Kara Knapp, BANKO (MRN [id number]) as of 05/25/2013 08:41  Ref. Range 05/24/2013 08:07 05/24/2013 11:28 05/24/2013 16:38 05/24/2013 21:41 05/25/2013 07:18  Glucose-Capillary Latest Range: 70-99 mg/dL 76 110 (H) 202 (H) 223 (H) 200 (H)    Inpatient Diabetes Program Recommendations Insulin - Basal: Please consider increasing Lantus back up to 55 units QHS (once surgery is completed and patient is restarted on diet).   Note: Noted that patient is NPO and is surgery is planned for today.  Therefore Lantus dose was decreased from 55 units QHS to 30 units QHS yesterday.  Once surgery is complete and diet is resumed, please consider increasing Lantus back up to 55 units QHS.  Will continue to follow.   05/25/13 @ 15:16 - Spoke with pt about diabetes and home regimen for diabetes control.  Patient states that she takes Lantus 40 units QHS and Metformin 500 mg BID as an outpatient for diabetes management.  Currently patient has no insurance so she states she goes to the health department for diabetes management and they provide her with her Lantus and she pays $4.00 out of pocket for her Metformin.  Discussed A1C results (11.1% on 05/22/2013) and explained what an A1C is and a goal for her of 7%.  Patient states that her A1C is being check at the health department and it is actually better at 11.1%.  Discussed importance of checking CBGs and maintaining good CBG control to prevent long-term and short-term complications.   Patient reports that she checks her blood sugars at home but she can not recall (however, she is recently back from surgery and a little groggy) at the moment what they have been running.  Patient verbalized  understanding of information discussed and has no further questions related to her diabetes at this time.  Will continue to follow.    Thanks, Barnie Alderman, RN, MSN, CCRN Diabetes Coordinator Inpatient Diabetes Program [phone] (Team Pager) [phone] ([facility] office) [phone] [facility] office)

## 2013-05-25 NOTE — Anesthesia Postprocedure Evaluation (Signed)
  Anesthesia Post-op Note  Patient: Kara Knapp  Procedure(s) Performed: Procedure(s): LAPAROSCOPIC CHOLECYSTECTOMY (N/A)  Patient Location: PACU  Anesthesia Type:General  Level of Consciousness: sedated and patient cooperative  Airway and Oxygen Therapy: Patient Spontanous Breathing and non-rebreather face mask  Post-op Pain: mild  Post-op Assessment: Post-op Vital signs reviewed, Patient's Cardiovascular Status Stable, Respiratory Function Stable and No signs of Nausea or vomiting  Post-op Vital Signs: Reviewed and stable  Complications: No apparent anesthesia complications

## 2013-05-25 NOTE — Transfer of Care (Signed)
Immediate Anesthesia Transfer of Care Note  Patient: Kara Knapp  Procedure(s) Performed: Procedure(s) (LRB): LAPAROSCOPIC CHOLECYSTECTOMY (N/A)  Patient Location: PACU  Anesthesia Type: General  Level of Consciousness: awake  Airway & Oxygen Therapy: Patient Spontanous Breathing and non-rebreather face mask  Post-op Assessment: Report given to PACU RN, Post -op Vital signs reviewed and stable and Patient moving all extremities  Post vital signs: Reviewed and stable  Complications: No apparent anesthesia complications

## 2013-05-26 DIAGNOSIS — K810 Acute cholecystitis: Secondary | ICD-10-CM | POA: Diagnosis present

## 2013-05-26 DIAGNOSIS — I10 Essential (primary) hypertension: Secondary | ICD-10-CM

## 2013-05-26 HISTORY — DX: Acute cholecystitis: K81.0

## 2013-05-26 LAB — HEPATIC FUNCTION PANEL
Alkaline Phosphatase: 48 U/L (ref 39–117)
Bilirubin, Direct: <0.1 mg/dL (ref 0.0–0.3)
Total Protein: 5.1 g/dL — ABNORMAL LOW (ref 6.0–8.3)

## 2013-05-26 LAB — BASIC METABOLIC PANEL
BUN: 15 mg/dL (ref 6–23)
Calcium: 8.6 mg/dL (ref 8.4–10.5)
Creatinine, Ser: 1.38 mg/dL — ABNORMAL HIGH (ref 0.50–1.10)
GFR calc Af Amer: 53 mL/min — ABNORMAL LOW (ref >90)
GFR calc non Af Amer: 46 mL/min — ABNORMAL LOW (ref >90)
Glucose, Bld: 80 mg/dL (ref 70–99)

## 2013-05-26 LAB — CBC
Hemoglobin: 10.1 g/dL — ABNORMAL LOW (ref 12.0–15.0)
MCH: 29.5 pg (ref 26.0–34.0)
MCHC: 33.3 g/dL (ref 30.0–36.0)
MCV: 88.6 fL (ref 78.0–100.0)
Platelets: 234 10*3/uL (ref 150–400)
RDW: 13.8 % (ref 11.5–15.5)

## 2013-05-26 LAB — GLUCOSE, CAPILLARY: Glucose-Capillary: 103 mg/dL — ABNORMAL HIGH (ref 70–99)

## 2013-05-26 MED ORDER — INSULIN GLARGINE 100 UNIT/ML ~~LOC~~ SOLN: 55.0000 [IU] | Freq: Every day | SUBCUTANEOUS | Status: DC

## 2013-05-26 MED ORDER — OXYCODONE-ACETAMINOPHEN 5-325 MG PO TABS: 1.0000 | ORAL_TABLET | Freq: Four times a day (QID) | ORAL | Status: DC | PRN

## 2013-05-26 MED ORDER — LISINOPRIL 10 MG PO TABS: 10.0000 mg | ORAL_TABLET | Freq: Every day | ORAL | Status: DC

## 2013-05-26 MED ORDER — HYDRALAZINE HCL 25 MG PO TABS: 25.0000 mg | ORAL_TABLET | Freq: Three times a day (TID) | ORAL | Status: DC

## 2013-05-26 MED ORDER — LEVOFLOXACIN 750 MG PO TABS: 750.0000 mg | ORAL_TABLET | Freq: Every day | ORAL | Status: DC

## 2013-05-26 NOTE — Progress Notes (Signed)
PHARMACIST - PHYSICIAN COMMUNICATION DR:   Arnoldo Morale & Memon CONCERNING: Antibiotic IV to Oral Route Change Policy  RECOMMENDATION: This patient is receiving Levaquin by the intravenous route.  Based on criteria approved by the Pharmacy and Therapeutics Committee, the antibiotic(s) is/are being converted to the equivalent oral dose form(s).   DESCRIPTION: These criteria include:  Patient being treated for a respiratory tract infection, urinary tract infection, cellulitis or clostridium difficile associated diarrhea if on metronidazole  The patient is not neutropenic and does not exhibit a GI malabsorption state  The patient is eating (either orally or via tube) and/or has been taking other orally administered medications for a least 24 hours  The patient is improving clinically and has a Tmax < 100.5  If you have questions about this conversion, please contact the Pharmacy Department  [x]   [phone] )  [facility] []   [phone] )  [facility]  []   [phone] )  [facility] []   [phone] )  [facility]   S. [place] Crane, PharmD

## 2013-05-26 NOTE — Progress Notes (Signed)
1 Day Post-Op  Subjective: Patient feels well. Tolerating by mouth well. Blood pressure under better control.  Objective: Vital signs in last 24 hours: Temp:  [97.6 F (36.4 C)-99 F (37.2 C)] 98.6 F (37 C) (10/28 0549) Pulse Rate:  [83-100] 89 (10/28 0549) Resp:  [11-34] 18 (10/28 0549) BP: (114-213)/(64-115) 114/64 mmHg (10/28 0549) SpO2:  [91 %-100 %] 96 % (10/28 0549) Last BM Date: 05/23/13  Intake/Output from previous day: 10/27 0701 - 10/28 0700 In: 4490 [P.O.:240; I.V.:3800; IV Piggyback:450] Out: 20 [Blood:20] Intake/Output this shift:    General appearance: alert, cooperative and no distress GI: soft, non-tender; bowel sounds normal; no masses,  no organomegaly and Dressings dry and intact.  Lab Results:   Recent Labs  05/25/13 0527 05/26/13 0506  WBC 5.9 12.2*  HGB 10.6* 10.1*  HCT 30.9* 30.3*  PLT 228 234   BMET  Recent Labs  05/25/13 0527 05/26/13 0506  NA 140 140  K 3.7 3.7  CL 107 106  CO2 27 26  GLUCOSE 258* 80  BUN 8 15  CREATININE 1.04 1.38*  CALCIUM 8.8 8.6   PT/INR No results found for this basename: LABPROT, INR,  in the last 72 hours  Studies/Results: No results found.  Anti-infectives: Anti-infectives   Start     Dose/Rate Route Frequency Ordered Stop   05/22/13 1900  levofloxacin (LEVAQUIN) IVPB 750 mg     750 mg 100 mL/hr over 90 Minutes Intravenous Every 24 hours 05/21/13 2127     05/22/13 0000  metroNIDAZOLE (FLAGYL) IVPB 500 mg     500 mg 100 mL/hr over 60 Minutes Intravenous Every 8 hours 05/21/13 2053     05/21/13 1515  levofloxacin (LEVAQUIN) IVPB 750 mg     750 mg 100 mL/hr over 90 Minutes Intravenous  Once 05/21/13 1512 05/21/13 1904   05/21/13 1515  metroNIDAZOLE (FLAGYL) IVPB 500 mg     500 mg 100 mL/hr over 60 Minutes Intravenous  Once 05/21/13 1512 05/21/13 1724      Assessment/Plan: s/p Procedure(s): LAPAROSCOPIC CHOLECYSTECTOMY Impression: Stable from surgical standpoint, may be discharged if cleared  by hospitalist. Pain medication as per hospitalist.  LOS: 5 days    Ruhama Lehew A 05/26/2013

## 2013-05-26 NOTE — Progress Notes (Signed)
UR chart review completed.  

## 2013-05-26 NOTE — Anesthesia Postprocedure Evaluation (Signed)
  Anesthesia Post-op Note  Patient: Kara Knapp  Procedure(s) Performed: Procedure(s): LAPAROSCOPIC CHOLECYSTECTOMY (N/A)  Patient Location: room 314  Anesthesia Type:General  Level of Consciousness: awake, alert , oriented and patient cooperative  Airway and Oxygen Therapy: Patient Spontanous Breathing  Post-op Pain: 2 /10, mild  Post-op Assessment: Post-op Vital signs reviewed, Patient's Cardiovascular Status Stable, Respiratory Function Stable, Patent Airway, No signs of Nausea or vomiting and Pain level controlled  Post-op Vital Signs: Reviewed and stable  Complications: No apparent anesthesia complications

## 2013-05-26 NOTE — Discharge Summary (Signed)
Physician Discharge Summary  Kara Knapp KZS:[id number] DOB: 01-27-1971 DOA: 05/21/2013  PCP: No PCP Per Patient  Admit date: 05/21/2013 Discharge date: 05/26/2013  Time spent: 35 minutes  Recommendations for Outpatient Follow-up:  1. Follow up with Dr. Arnoldo Morale as scheduled 2. Follow up with primary care doctor [facility] on 10/30, recheck cbc for wbc count and bmet for renal function to ensure stability  Discharge Diagnoses:  Active Problems:   Abdominal pain, left upper quadrant   Gall stones   Type II or unspecified type diabetes mellitus with neurological manifestations, not stated as uncontrolled(250.60)   Essential hypertension, benign   Discharge Condition: improved  Diet recommendation: low salt  Filed Weights   05/21/13 1117  Weight: 81.647 kg (180 lb)    History of present illness:  42 year old female with a history of diabetes mellitus, asthma, hypertension, recent diagnosis of osteomyelitis status post 6 weeks of IV antibiotic therapy, peripheral neuropathy came to the hospital with 3 weeks of abdominal pain located under the ribs and left upper quadrant. The pain is intermittent and is not associated with nausea and vomiting. She also denies any diarrhea but has been having irregular bowel movements over the past few weeks. She denies chest pain no shortness of breath no fever no dysuria urgency or frequency of urination. She is mostly of stroke no seizures. In the ED abdominal ultrasound was done which shows numerous gallstones but Murphy's sign is negative.   Hospital Course:  This patient was admitted to the hospital with abdominal pain located under her ribs and left upper quadrant.  Initially workup in the emergency room indicated possible left lower lobe pneumonia, although patient did not have any clinical features of pneumonia. She was started empirically on Levaquin. Further workup including cholelithiasis with gallbladder wall thickening. She was seen  by general surgery and underwent laparoscopic cholecystectomy. Patient tolerated this procedure well and did not have any immediate complications. Her symptoms appear improved. She was noted to be significantly hypertensive and was started on hydralazine in addition to her lisinopril. She does have a mild elevation in WBC count was felt to be due to stress from surgery. She is afebrile and appears clinically well. She will followup with her primary care physician on 10/30. Recommendations are for repeat CBC and basic metabolic panel to ensure that WBC count and renal function is stable.  Procedures:  Laparoscopic cholecystectomy  Consultations:  General Surgery, Dr. Arnoldo Morale  Discharge Exam: Filed Vitals:   05/26/13 0549  BP: 114/64  Pulse: 89  Temp: 98.6 F (37 C)  Resp: 18    General: NAD Cardiovascular: S1, S2 RRR Respiratory: CTA B  Discharge Instructions  Discharge Orders   Future Orders Complete By Expires   Call MD for:  persistant nausea and vomiting  As directed    Call MD for:  severe uncontrolled pain  As directed    Call MD for:  temperature >100.4  As directed    Diet - low sodium heart healthy  As directed    Increase activity slowly  As directed        Medication List    STOP taking these medications       ciprofloxacin 500 MG tablet  Commonly known as:  CIPRO      TAKE these medications       acetaminophen 500 MG tablet  Commonly known as:  TYLENOL  Take 1,000 mg by mouth every 6 (six) hours as needed. For pain  budesonide-formoterol 160-4.5 MCG/ACT inhaler  Commonly known as:  SYMBICORT  Inhale 2 puffs into the lungs 2 (two) times daily as needed (shortness of breath).     cyclobenzaprine 10 MG tablet  Commonly known as:  FLEXERIL  Take 10 mg by mouth 2 (two) times daily as needed for muscle spasms.     gabapentin 300 MG capsule  Commonly known as:  NEURONTIN  Take 600 mg by mouth 3 (three) times daily.     hydrALAZINE 25 MG tablet   Commonly known as:  APRESOLINE  Take 1 tablet (25 mg total) by mouth 3 (three) times daily.     insulin glargine 100 UNIT/ML injection  Commonly known as:  LANTUS  Inject 0.55 mLs (55 Units total) into the skin at bedtime.     lisinopril 10 MG tablet  Commonly known as:  PRINIVIL,ZESTRIL  Take 10 mg by mouth daily.     metFORMIN 1000 MG tablet  Commonly known as:  GLUCOPHAGE  Take 500 mg by mouth 2 (two) times daily with a meal.     oxyCODONE-acetaminophen 5-325 MG per tablet  Commonly known as:  PERCOCET/ROXICET  Take 1-2 tablets by mouth every 6 (six) hours as needed.       Allergies  Allergen Reactions  . Bactrim [Sulfamethoxazole-Tmp Ds] Nausea And Vomiting       Follow-up Information   Follow up with Jamesetta So, MD. Schedule an appointment as soon as possible for a visit on 06/02/2013.   Specialty:  General Surgery   Contact information:   [address] [address] [phone]       Follow up with primary care doctor on 10/30 as tolerated. (have blood drawn when you see your doctor: CBC, BMET)        The results of significant diagnostics from this hospitalization (including imaging, microbiology, ancillary and laboratory) are listed below for reference.    Significant Diagnostic Studies: Dg Chest 2 View  05/21/2013   CLINICAL DATA:  Cough and shortness of breath  EXAM: CHEST  2 VIEW  COMPARISON:  March 21, 2013  FINDINGS: There is patchy left lower lobe infiltrate. Lungs are otherwise clear. Heart size and pulmonary vascularity are normal. No adenopathy. No bone lesions.  IMPRESSION: Patchy left lower lobe infiltrate.   Electronically Signed   By: Lowella Grip M.D.   On: 05/21/2013 14:49   US Abdomen Limited Ruq  05/21/2013   CLINICAL DATA:  Right upper quadrant pain for 3 weeks.  EXAM: US ABDOMEN LIMITED - RIGHT UPPER QUADRANT  COMPARISON:  None.  FINDINGS: Gallbladder  Within the right upper quadrant, the wall -echo -shadow  complex is noted, consistent with gallbladder filled with stones. Gallbladder wall is 3.1 mm in thickness. No definite pericholecystic fluid identified. No sonographic Murphy sign.  Common bile duct  Diameter: 4.1 mm  Liver:  No focal lesion identified. Within normal limits in parenchymal echogenicity.  IMPRESSION: 1. Numerous gallstones. 2. Thickened gallbladder wall. 3. No definite Murphy sign. However, the findings are suspicious for cholecystitis.   Electronically Signed   By: Shon Hale M.D.   On: 05/21/2013 14:55    Microbiology: Recent Results (from the past 240 hour(s))  SURGICAL PCR SCREEN     Status: None   Collection Time    05/24/13 11:30 AM      Result Value Range Status   MRSA, PCR NEGATIVE  NEGATIVE Final   Staphylococcus aureus NEGATIVE  NEGATIVE Final   Comment:  The Xpert SA Assay (FDA     approved for NASAL specimens     in patients over 29 years of age),     is one component of     a comprehensive surveillance     program.  Test performance has     been validated by [employer] for patients greater     than or equal to 49 year old.     It is not intended     to diagnose infection nor to     guide or monitor treatment.     Labs: Basic Metabolic Panel:  Recent Labs Lab 05/22/13 0515 05/23/13 8850 05/24/13 0618 05/25/13 0527 05/26/13 0506  NA 133* 137 138 140 140  K 4.3 3.7 3.7 3.7 3.7  CL 98 104 106 107 106  CO2 26 26 26 27 26   GLUCOSE 318* 207* 112* 258* 80  BUN 17 11 7 8 15   CREATININE 1.13* 1.05 1.05 1.04 1.38*  CALCIUM 9.1 9.0 8.7 8.8 8.6   Liver Function Tests:  Recent Labs Lab 05/22/13 0515 05/23/13 0628 05/24/13 0618 05/25/13 0527 05/26/13 0506  AST 13 11 13 15  32  ALT 9 8 8 10 20   ALKPHOS 69 64 60 71 48  BILITOT 0.2* <0.1* <0.1* <0.1* <0.1*  PROT 6.2 5.6* 5.5* 5.4* 5.1*  ALBUMIN 2.7* 2.4* 2.3* 2.3* 2.2*    Recent Labs Lab 05/21/13 1305 05/22/13 0515  LIPASE 123* 17   No results found for this basename:  AMMONIA,  in the last 168 hours CBC:  Recent Labs Lab 05/21/13 1305 05/22/13 0515 05/23/13 0628 05/24/13 0618 05/25/13 0527 05/26/13 0506  WBC 8.5 9.4 6.8 5.0 5.9 12.2*  NEUTROABS 5.8  --   --   --   --   --   HGB 12.2 11.2* 11.1* 11.0* 10.6* 10.1*  HCT 34.7* 32.3* 32.5* 32.1* 30.9* 30.3*  MCV 85.9 86.6 87.8 87.5 88.0 88.6  PLT 293 280 265 243 228 234   Cardiac Enzymes: No results found for this basename: CKTOTAL, CKMB, CKMBINDEX, TROPONINI,  in the last 168 hours BNP: BNP (last 3 results) No results found for this basename: PROBNP,  in the last 8760 hours CBG:  Recent Labs Lab 05/25/13 1526 05/25/13 1629 05/25/13 2150 05/26/13 0756 05/26/13 1127  GLUCAP 210* 210* 160* 103* 146*       Signed:  Miarose Lippert  [facility] 05/26/2013, 2:49 PM

## 2013-05-26 NOTE — Discharge Planning (Signed)
Pt stated she was ready to go home and she had no pain.  Pt's IV was removed and given DC papers with family in room.  Pt was educated on S/Sx that may require her to call doctor or return to the hospital.  Pt will be wheeled to car when pt is ready.

## 2013-05-27 ENCOUNTER — Encounter (HOSPITAL_COMMUNITY): Payer: Self-pay | Admitting: General Surgery

## 2013-05-28 ENCOUNTER — Emergency Department (HOSPITAL_COMMUNITY): Payer: Self-pay

## 2013-05-28 ENCOUNTER — Emergency Department (HOSPITAL_COMMUNITY)
Admission: EM | Admit: 2013-05-28 | Discharge: 2013-05-28 | Disposition: A | Discharge status: Home / Self Care | Payer: Medicaid Other | Attending: Emergency Medicine | Admitting: Emergency Medicine

## 2013-05-28 ENCOUNTER — Emergency Department (HOSPITAL_COMMUNITY): Payer: Medicaid Other

## 2013-05-28 ENCOUNTER — Encounter (HOSPITAL_COMMUNITY): Payer: Self-pay | Admitting: Emergency Medicine

## 2013-05-28 DIAGNOSIS — I10 Essential (primary) hypertension: Secondary | ICD-10-CM | POA: Insufficient documentation

## 2013-05-28 DIAGNOSIS — IMO0002 Reserved for concepts with insufficient information to code with codable children: Secondary | ICD-10-CM | POA: Insufficient documentation

## 2013-05-28 DIAGNOSIS — G8918 Other acute postprocedural pain: Secondary | ICD-10-CM | POA: Insufficient documentation

## 2013-05-28 DIAGNOSIS — M7989 Other specified soft tissue disorders: Secondary | ICD-10-CM

## 2013-05-28 DIAGNOSIS — Z8739 Personal history of other diseases of the musculoskeletal system and connective tissue: Secondary | ICD-10-CM | POA: Insufficient documentation

## 2013-05-28 DIAGNOSIS — R609 Edema, unspecified: Secondary | ICD-10-CM | POA: Insufficient documentation

## 2013-05-28 DIAGNOSIS — E119 Type 2 diabetes mellitus without complications: Secondary | ICD-10-CM | POA: Insufficient documentation

## 2013-05-28 DIAGNOSIS — J45901 Unspecified asthma with (acute) exacerbation: Secondary | ICD-10-CM | POA: Insufficient documentation

## 2013-05-28 DIAGNOSIS — Z79899 Other long term (current) drug therapy: Secondary | ICD-10-CM | POA: Insufficient documentation

## 2013-05-28 DIAGNOSIS — Z794 Long term (current) use of insulin: Secondary | ICD-10-CM | POA: Insufficient documentation

## 2013-05-28 DIAGNOSIS — F17200 Nicotine dependence, unspecified, uncomplicated: Secondary | ICD-10-CM | POA: Insufficient documentation

## 2013-05-28 DIAGNOSIS — R1011 Right upper quadrant pain: Secondary | ICD-10-CM | POA: Insufficient documentation

## 2013-05-28 DIAGNOSIS — G589 Mononeuropathy, unspecified: Secondary | ICD-10-CM | POA: Insufficient documentation

## 2013-05-28 MED ORDER — FUROSEMIDE 20 MG PO TABS: 20.0000 mg | ORAL_TABLET | Freq: Every day | ORAL | Status: DC

## 2013-05-28 NOTE — ED Provider Notes (Signed)
CSN: [id number]     Arrival date & time 05/28/13  1006 History  This chart was scribed for Kara Acosta, MD by Kara Knapp, ED Scribe. The patient was seen in room APA16A/AP[facility]16A. Patient's care was started at 10:33 AM.     Chief Complaint  Patient presents with  . Leg Swelling    The history is provided by the patient. No language interpreter was used.    HPI Comments: Kara Knapp is a 42 y.o. female who presents to the Emergency Department complaining of constant, worsening bilateral leg swelling, worse in the right and right arm swelling that began two days ago. She has mild shortness of breath onset upon waking this morning. She denies tenderness with the swelling. She had a cholecystectomy on 10/27 and was discharged 10/28, which is when the swelling began. She has a history of swelling in her extremities and her last episode was about three months ago. She has a past medical history of Diabetes mellitus, neuropathy, asthma, and hypertension.   Past Medical History  Diagnosis Date  . Diabetes mellitus   . Neuropathy   . Asthma   . Hypertension   . Osteomyelitis     left middle toe    Past Surgical History  Procedure Laterality Date  . Cesarean section    . Tubal ligation    . Cholecystectomy N/A 05/25/2013    Procedure: LAPAROSCOPIC CHOLECYSTECTOMY;  Surgeon: Kara So, MD;  Location: [facility];  Service: General;  Laterality: N/A;   Family History  Problem Relation Age of Onset  . COPD Mother   . Cancer Father   . Diabetes Sister   . Diabetes Brother    History  Substance Use Topics  . Smoking status: Current Every Day Smoker -- 0.50 packs/day for 20 years    Types: Cigarettes  . Smokeless tobacco: Not on file  . Alcohol Use: No   OB History   Grav Para Term Preterm Abortions TAB SAB Ect Mult Living   5 5             Review of Systems  Constitutional: Negative for fever and chills.  Respiratory: Positive for shortness of breath.   Cardiovascular:  Positive for leg swelling.  Gastrointestinal: Negative for nausea and vomiting.  All other systems reviewed and are negative.    Allergies  Bactrim  Home Medications   Current Outpatient Rx  Name  Route  Sig  Dispense  Refill  . budesonide-formoterol (SYMBICORT) 160-4.5 MCG/ACT inhaler   Inhalation   Inhale 2 puffs into the lungs 2 (two) times daily as needed (shortness of breath).         . gabapentin (NEURONTIN) 300 MG capsule   Oral   Take 600 mg by mouth 3 (three) times daily.          . insulin glargine (LANTUS) 100 UNIT/ML injection   Subcutaneous   Inject 40 Units into the skin at bedtime.         Marland Kitchen lisinopril (PRINIVIL,ZESTRIL) 10 MG tablet   Oral   Take 10 mg by mouth daily.         . metFORMIN (GLUCOPHAGE) 1000 MG tablet   Oral   Take 500 mg by mouth 2 (two) times daily with a meal.          . oxyCODONE-acetaminophen (PERCOCET/ROXICET) 5-325 MG per tablet   Oral   Take 1-2 tablets by mouth every 6 (six) hours as needed.   30 tablet  0   . acetaminophen (TYLENOL) 500 MG tablet   Oral   Take 1,000 mg by mouth every 6 (six) hours as needed. For pain         . furosemide (LASIX) 20 MG tablet   Oral   Take 1 tablet (20 mg total) by mouth daily.   10 tablet   0   . hydrALAZINE (APRESOLINE) 25 MG tablet   Oral   Take 1 tablet (25 mg total) by mouth 3 (three) times daily.   90 tablet   0    Triage Vitals: BP 153/88  Pulse 94  Temp(Src) 98.6 F (37 C) (Oral)  Resp 20  Ht 5' 3"  (1.6 m)  Wt 180 lb (81.647 kg)  BMI 31.89 kg/m2  SpO2 95%  LMP 05/19/2013  Physical Exam  Nursing note and vitals reviewed. Constitutional: She is oriented to person, place, and time. She appears well-developed and well-nourished.  HENT:  Head: Normocephalic and atraumatic.  Eyes: Pupils are equal, round, and reactive to light.  Neck: Neck supple.  Cardiovascular: Normal rate, regular rhythm and normal heart sounds.   Pulmonary/Chest: Effort normal. No  respiratory distress. She has no wheezes.  Abdominal: Soft. Bowel sounds are normal.  Mild post-operative tenderness in RUQ.  Musculoskeletal:  Bilateral lower extremity pitting edema, right greater than left. Asymmetrical edema of upper extremities, right greater than left. Normal pulses in all extremities.   Neurological: She is alert and oriented to person, place, and time.  Skin: Skin is warm and dry.  No redness or tenderness over right upper extremity where she had IV site.  Psychiatric: She has a normal mood and affect.    ED Course  Procedures (including critical care time)  DIAGNOSTIC STUDIES: Oxygen Saturation is 95% on room air, adequate by my interpretation.    COORDINATION OF CARE: 10:38 AM -Will order ultrasound to rule out DVT. Patient verbalizes understanding and agrees with treatment plan.    Labs Review Labs Reviewed - No data to display Imaging Review US Venous Img Lower Bilateral  05/28/2013   CLINICAL DATA:  Leg swelling  EXAM: VENOUS DOPPLER ULTRASOUND OF BILATERAL LOWER EXTREMITIES  TECHNIQUE: Gray-scale sonography with graded compression, as well as color Doppler and duplex ultrasound, were performed to evaluate the deep venous system from the level of the common femoral vein through the popliteal and proximal calf veins. Spectral Doppler was utilized to evaluate flow at rest and with distal augmentation maneuvers.  COMPARISON:  None.  FINDINGS: Thrombus within deep veins:  None visualized.  Compressibility of deep veins:  Normal.  Duplex waveform respiratory phasicity:  Normal.  Duplex waveform response to augmentation:  Normal.  Venous reflux:  None visualized.  Other findings:  None visualized.  IMPRESSION: No evidence of deep venous thrombosis bilaterally.   Electronically Signed   By: Kara Knapp M.D.   On: 05/28/2013 11:34    EKG Interpretation   None       MDM   1. Swelling of left lower extremity   2. Swelling of right lower extremity    Pt  has bilateral swelling - liekly realted to admission and fluid retention - no pulm sx, no fluid in lungs, no hypoxia and no redness to legs.  [place] neg for DVT  Lasix and home.  Meds given in ED:  Medications - No data to display  New Prescriptions   FUROSEMIDE (LASIX) 20 MG TABLET    Take 1 tablet (20 mg total) by mouth daily.  I personally performed the services described in this documentation, which was scribed in my presence. The recorded information has been reviewed and is accurate.      Kara Acosta, MD 05/28/13 1225

## 2013-05-28 NOTE — ED Notes (Signed)
Gallbladder surgery x 4 days ago.  Reports mild swelling at that time.  Swelling worsening in bil legs and right hand/arm since last night.  Mild sob with movement.

## 2013-07-09 ENCOUNTER — Emergency Department (HOSPITAL_COMMUNITY): Payer: Medicaid Other

## 2013-07-09 ENCOUNTER — Emergency Department (HOSPITAL_COMMUNITY)
Admission: EM | Admit: 2013-07-09 | Discharge: 2013-07-09 | Disposition: A | Discharge status: Home / Self Care | Payer: Medicaid Other | Attending: Emergency Medicine | Admitting: Emergency Medicine

## 2013-07-09 ENCOUNTER — Encounter (HOSPITAL_COMMUNITY): Payer: Self-pay | Admitting: Emergency Medicine

## 2013-07-09 DIAGNOSIS — Z8739 Personal history of other diseases of the musculoskeletal system and connective tissue: Secondary | ICD-10-CM | POA: Insufficient documentation

## 2013-07-09 DIAGNOSIS — Z79899 Other long term (current) drug therapy: Secondary | ICD-10-CM | POA: Insufficient documentation

## 2013-07-09 DIAGNOSIS — Z9089 Acquired absence of other organs: Secondary | ICD-10-CM | POA: Insufficient documentation

## 2013-07-09 DIAGNOSIS — L03039 Cellulitis of unspecified toe: Secondary | ICD-10-CM | POA: Insufficient documentation

## 2013-07-09 DIAGNOSIS — M549 Dorsalgia, unspecified: Secondary | ICD-10-CM | POA: Insufficient documentation

## 2013-07-09 DIAGNOSIS — R109 Unspecified abdominal pain: Secondary | ICD-10-CM

## 2013-07-09 DIAGNOSIS — I10 Essential (primary) hypertension: Secondary | ICD-10-CM | POA: Insufficient documentation

## 2013-07-09 DIAGNOSIS — Z8669 Personal history of other diseases of the nervous system and sense organs: Secondary | ICD-10-CM | POA: Insufficient documentation

## 2013-07-09 DIAGNOSIS — Z794 Long term (current) use of insulin: Secondary | ICD-10-CM | POA: Insufficient documentation

## 2013-07-09 DIAGNOSIS — J45909 Unspecified asthma, uncomplicated: Secondary | ICD-10-CM | POA: Insufficient documentation

## 2013-07-09 DIAGNOSIS — F17200 Nicotine dependence, unspecified, uncomplicated: Secondary | ICD-10-CM | POA: Insufficient documentation

## 2013-07-09 DIAGNOSIS — R1013 Epigastric pain: Secondary | ICD-10-CM | POA: Insufficient documentation

## 2013-07-09 DIAGNOSIS — E119 Type 2 diabetes mellitus without complications: Secondary | ICD-10-CM | POA: Insufficient documentation

## 2013-07-09 DIAGNOSIS — Z9851 Tubal ligation status: Secondary | ICD-10-CM | POA: Insufficient documentation

## 2013-07-09 DIAGNOSIS — L03031 Cellulitis of right toe: Secondary | ICD-10-CM

## 2013-07-09 LAB — COMPREHENSIVE METABOLIC PANEL
ALT: 9 U/L (ref 0–35)
AST: 11 U/L (ref 0–37)
BUN: 12 mg/dL (ref 6–23)
CO2: 24 mEq/L (ref 19–32)
Calcium: 9.4 mg/dL (ref 8.4–10.5)
Chloride: 95 mEq/L — ABNORMAL LOW (ref 96–112)
Creatinine, Ser: 1.29 mg/dL — ABNORMAL HIGH (ref 0.50–1.10)
GFR calc Af Amer: 58 mL/min — ABNORMAL LOW (ref >90)
GFR calc non Af Amer: 50 mL/min — ABNORMAL LOW (ref >90)
Glucose, Bld: 380 mg/dL — ABNORMAL HIGH (ref 70–99)
Sodium: 131 mEq/L — ABNORMAL LOW (ref 135–145)
Total Bilirubin: 0.1 mg/dL — ABNORMAL LOW (ref 0.3–1.2)

## 2013-07-09 LAB — CBC
HCT: 33.3 % — ABNORMAL LOW (ref 36.0–46.0)
Hemoglobin: 11.5 g/dL — ABNORMAL LOW (ref 12.0–15.0)
MCH: 29.7 pg (ref 26.0–34.0)
MCHC: 34.5 g/dL (ref 30.0–36.0)
MCV: 86 fL (ref 78.0–100.0)
RBC: 3.87 MIL/uL (ref 3.87–5.11)

## 2013-07-09 LAB — LIPASE, BLOOD: Lipase: 19 U/L (ref 11–59)

## 2013-07-09 MED ORDER — CLINDAMYCIN HCL 150 MG PO CAPS: 300.0000 mg | ORAL_CAPSULE | Freq: Three times a day (TID) | ORAL | Status: DC

## 2013-07-09 MED ORDER — TRAMADOL HCL 50 MG PO TABS: 50.0000 mg | ORAL_TABLET | Freq: Two times a day (BID) | ORAL | Status: DC | PRN

## 2013-07-09 MED ORDER — CLINDAMYCIN HCL 150 MG PO CAPS
300.0000 mg | ORAL_CAPSULE | Freq: Once | ORAL | Status: AC
  Administered 2013-07-09: 300 mg via ORAL
  Filled 2013-07-09: qty 2

## 2013-07-09 MED ORDER — LIDOCAINE HCL (CARDIAC) 10 MG/ML IV SOLN
10.0000 mL | Freq: Once | INTRAVENOUS | Status: DC
  Filled 2013-07-09: qty 10

## 2013-07-09 MED ORDER — LIDOCAINE HCL (PF) 1 % IJ SOLN
10.0000 mL | Freq: Once | INTRAMUSCULAR | Status: DC
  Filled 2013-07-09: qty 10

## 2013-07-09 MED ORDER — TRAMADOL HCL 50 MG PO TABS
50.0000 mg | ORAL_TABLET | Freq: Once | ORAL | Status: AC
  Administered 2013-07-09: 50 mg via ORAL
  Filled 2013-07-09: qty 1

## 2013-07-09 MED ORDER — LIDOCAINE HCL (CARDIAC) 10 MG/ML IV SOLN: 10.0000 mL | Freq: Once | INTRAVENOUS | Status: DC

## 2013-07-09 NOTE — ED Provider Notes (Signed)
CSN: [id number]     Arrival date & time 07/09/13  1152 History  This chart was scribed for Osvaldo Shipper, MD,  by Stacy Gardner, ED Scribe. The patient was seen in room APA03/APA03 and the patient's care was started at 1:32 PM   First MD Initiated Contact with Patient 07/09/13 1258     Chief Complaint  Patient presents with  . Abdominal Pain  . Sore   (Consider location/radiation/quality/duration/timing/severity/associated sxs/prior Treatment) Patient is a 42 y.o. female presenting with abdominal pain. The history is provided by the patient.  Abdominal Pain Pain location:  Epigastric Pain quality: aching   Pain radiates to:  LUQ and RUQ Pain severity:  Mild Onset quality:  Gradual Duration:  4 weeks Timing:  Intermittent Progression:  Unchanged Chronicity:  Recurrent Context: recent illness (recent cholecystectomy 4 weeks ago)   Relieved by: tramadol. Worsened by:  Nothing tried Associated symptoms: no diarrhea, no dysuria, no fever, no hematemesis, no hematochezia, no nausea and no vomiting    HPI Comments: Emari A Churchman is a 42 y.o. female who presents to the Emergency Department complaining of sore to her right foot on the first digit that appeared yesterday. She was wearing shoes and she noticed the shoe was tight on her foot. Pt reports the next day the right big was red and swollen. Pt denies fever, nausea and vomiting.  She also complains of constant, moderate abdominal pain. Pt had a cholecystectomy two years and since has daily epigastric pain. She is taking Tramadol for pain. The abdominal pain radiates to her back and is similar in nature to her cholecystectomy. In additions she has decreased appetite. She denies decrease voidPt has a past medical hx of DM, neuropathy, asthma, HTN and osteomyelitis and dysuria.  Past Medical History  Diagnosis Date  . Diabetes mellitus   . Neuropathy   . Asthma   . Hypertension   . Osteomyelitis     left middle toe     Past Surgical History  Procedure Laterality Date  . Cesarean section    . Tubal ligation    . Cholecystectomy N/A 05/25/2013    Procedure: LAPAROSCOPIC CHOLECYSTECTOMY;  Surgeon: Jamesetta So, MD;  Location: [facility];  Service: General;  Laterality: N/A;   Family History  Problem Relation Age of Onset  . COPD Mother   . Cancer Father   . Diabetes Sister   . Diabetes Brother    History  Substance Use Topics  . Smoking status: Current Every Day Smoker -- 0.50 packs/day for 20 years    Types: Cigarettes  . Smokeless tobacco: Not on file  . Alcohol Use: No   OB History   Grav Para Term Preterm Abortions TAB SAB Ect Mult Living   5 5             Review of Systems  Constitutional: Positive for appetite change. Negative for fever.  Gastrointestinal: Positive for abdominal pain. Negative for nausea, vomiting, diarrhea, hematochezia and hematemesis.  Genitourinary: Negative for dysuria and decreased urine volume.  Musculoskeletal: Positive for back pain.  Skin: Positive for wound.  All other systems reviewed and are negative.    Allergies  Bactrim and Prednisone  Home Medications   Current Outpatient Rx  Name  Route  Sig  Dispense  Refill  . acetaminophen (TYLENOL) 500 MG tablet   Oral   Take 1,000 mg by mouth every 6 (six) hours as needed. For pain         .  budesonide-formoterol (SYMBICORT) 160-4.5 MCG/ACT inhaler   Inhalation   Inhale 2 puffs into the lungs 2 (two) times daily as needed (shortness of breath).         . gabapentin (NEURONTIN) 300 MG capsule   Oral   Take 600 mg by mouth 3 (three) times daily.          . hydrALAZINE (APRESOLINE) 25 MG tablet   Oral   Take 25 mg by mouth 2 (two) times daily.         . insulin glargine (LANTUS) 100 UNIT/ML injection   Subcutaneous   Inject 40 Units into the skin at bedtime.         . metFORMIN (GLUCOPHAGE) 1000 MG tablet   Oral   Take 1,000 mg by mouth 2 (two) times daily with a meal.           . traMADol (ULTRAM) 50 MG tablet   Oral   Take 50 mg by mouth every 6 (six) hours as needed for moderate pain.          BP 127/80  Pulse 89  Temp(Src) 97.8 F (36.6 C) (Oral)  Resp 18  SpO2 96%  LMP 07/09/2013 Physical Exam  Nursing note and vitals reviewed. Constitutional: She is oriented to person, place, and time. She appears well-developed and well-nourished. No distress.  HENT:  Head: Normocephalic and atraumatic.  Eyes: EOM are normal.  Neck: Neck supple. No tracheal deviation present.  Cardiovascular: Normal rate.   Pulmonary/Chest: Effort normal. No respiratory distress.  Abdominal: Soft. Bowel sounds are normal. She exhibits no distension. There is no tenderness. There is no rebound and no guarding.  Musculoskeletal: Normal range of motion.  R great toe with large paronychia  Neurological: She is alert and oriented to person, place, and time.  Skin: Skin is warm and dry.  Psychiatric: She has a normal mood and affect. Her behavior is normal.    ED Course  INCISION AND DRAINAGE Date/Time: 07/09/2013 8:48 PM Performed by: Osvaldo Shipper Authorized by: Osvaldo Shipper Consent: Verbal consent obtained. Indications for incision and drainage: Paronychia. Body area: lower extremity Location details: right big toe Anesthesia: digital block Local anesthetic: lidocaine 1% with epinephrine Anesthetic total: 4 ml Patient sedated: no Scalpel size: 11 Incision type: single straight Complexity: simple Drainage: purulent Drainage amount: copious Wound treatment: wound left open Packing material: none Patient tolerance: Patient tolerated the procedure well with no immediate complications.  NERVE BLOCK Date/Time: 07/09/2013 8:52 PM Performed by: Osvaldo Shipper Authorized by: Osvaldo Shipper Consent: Verbal consent obtained. Indications: pain relief and debridement Body area: lower extremity Nerve: digital Laterality: right Patient  sedated: no Preparation: Patient was prepped and draped in the usual sterile fashion. Patient position: sitting Needle gauge: 25 G Location technique: anatomical landmarks Local anesthetic: lidocaine 1% with epinephrine Anesthetic total: 4 ml Outcome: pain improved Patient tolerance: Patient tolerated the procedure well with no immediate complications.   (including critical care time) DIAGNOSTIC STUDIES: Oxygen Saturation is 96% on, normal by my interpretation.    COORDINATION OF CARE: 1:36 PM Discussed course of care with pt . Pt understands and agrees.  Labs Review Labs Reviewed - No data to display Imaging Review No results found.  EKG Interpretation   None       MDM   1. Paronychia of great toe, right   2. Abdominal pain    41F present with abdominal pain and toe pain. Abdominal pain: Present daily since her recent cholecystectomy  1 month ago. Happens daily. No fevers, no nausea. Was relieved with tramadol then she ran out. Belly with mild upper tenderness, no rebound or guarding. Labs normal. No need for CT. Tramadol given. Toe pain: Large R great toe paronychia. I&D after digital block performed with large pus expression. Placed on clindamycin since she is diabetic. Stable for discharge.  I personally performed the services described in this documentation, which was scribed in my presence. The recorded information has been reviewed and is accurate.      Osvaldo Shipper, MD 07/09/13 2053

## 2013-07-09 NOTE — ED Notes (Signed)
Pt states continued pain to abdomen after having gallbladder removed "a month or two ago". Pt also states blister developed to right great toe yesterday. Large blister noted with redness to the toe. Pt is diabetic.

## 2013-07-09 NOTE — ED Notes (Signed)
telfa dressing with steril 2 x 2s and wrap applied to right great toe.  Area cleaned and dried before dressing applied and foot marked with marker.  Discussed signs of worsening infection including spreading of redness, drainage, heat in area and informed pt to keep clean, dry and dressed and to return if any worse.  Pt verbalized understanding.

## 2013-07-28 ENCOUNTER — Encounter (HOSPITAL_COMMUNITY): Payer: Self-pay | Admitting: Emergency Medicine

## 2013-07-28 ENCOUNTER — Emergency Department (HOSPITAL_COMMUNITY)
Admission: EM | Admit: 2013-07-28 | Discharge: 2013-07-28 | Disposition: A | Discharge status: Home / Self Care | Payer: Medicaid Other | Attending: Emergency Medicine | Admitting: Emergency Medicine

## 2013-07-28 ENCOUNTER — Emergency Department (HOSPITAL_COMMUNITY): Payer: Medicaid Other

## 2013-07-28 ENCOUNTER — Emergency Department (HOSPITAL_COMMUNITY): Payer: Self-pay

## 2013-07-28 DIAGNOSIS — J45909 Unspecified asthma, uncomplicated: Secondary | ICD-10-CM | POA: Insufficient documentation

## 2013-07-28 DIAGNOSIS — Z79899 Other long term (current) drug therapy: Secondary | ICD-10-CM | POA: Insufficient documentation

## 2013-07-28 DIAGNOSIS — Z9851 Tubal ligation status: Secondary | ICD-10-CM | POA: Insufficient documentation

## 2013-07-28 DIAGNOSIS — R350 Frequency of micturition: Secondary | ICD-10-CM | POA: Insufficient documentation

## 2013-07-28 DIAGNOSIS — Z8669 Personal history of other diseases of the nervous system and sense organs: Secondary | ICD-10-CM | POA: Insufficient documentation

## 2013-07-28 DIAGNOSIS — Z792 Long term (current) use of antibiotics: Secondary | ICD-10-CM | POA: Insufficient documentation

## 2013-07-28 DIAGNOSIS — E119 Type 2 diabetes mellitus without complications: Secondary | ICD-10-CM | POA: Insufficient documentation

## 2013-07-28 DIAGNOSIS — M549 Dorsalgia, unspecified: Secondary | ICD-10-CM | POA: Insufficient documentation

## 2013-07-28 DIAGNOSIS — F17200 Nicotine dependence, unspecified, uncomplicated: Secondary | ICD-10-CM | POA: Insufficient documentation

## 2013-07-28 DIAGNOSIS — R739 Hyperglycemia, unspecified: Secondary | ICD-10-CM

## 2013-07-28 DIAGNOSIS — Z794 Long term (current) use of insulin: Secondary | ICD-10-CM | POA: Insufficient documentation

## 2013-07-28 DIAGNOSIS — Z9089 Acquired absence of other organs: Secondary | ICD-10-CM | POA: Insufficient documentation

## 2013-07-28 DIAGNOSIS — Z8739 Personal history of other diseases of the musculoskeletal system and connective tissue: Secondary | ICD-10-CM | POA: Insufficient documentation

## 2013-07-28 DIAGNOSIS — I10 Essential (primary) hypertension: Secondary | ICD-10-CM | POA: Insufficient documentation

## 2013-07-28 DIAGNOSIS — N390 Urinary tract infection, site not specified: Secondary | ICD-10-CM

## 2013-07-28 LAB — HEPATIC FUNCTION PANEL
ALT: 10 U/L (ref 0–35)
AST: 14 U/L (ref 0–37)
Albumin: 3.1 g/dL — ABNORMAL LOW (ref 3.5–5.2)
Total Bilirubin: <0.2 mg/dL — ABNORMAL LOW (ref 0.3–1.2)
Total Protein: 7.8 g/dL (ref 6.0–8.3)

## 2013-07-28 LAB — URINALYSIS, ROUTINE W REFLEX MICROSCOPIC
Glucose, UA: >1000 mg/dL — AB
Ketones, ur: NEGATIVE mg/dL
Nitrite: NEGATIVE
Specific Gravity, Urine: 1.02 (ref 1.005–1.030)
Urobilinogen, UA: 0.2 mg/dL (ref 0.0–1.0)
pH: 6 (ref 5.0–8.0)

## 2013-07-28 LAB — CBC WITH DIFFERENTIAL/PLATELET
Basophils Absolute: 0.1 10*3/uL (ref 0.0–0.1)
Eosinophils Relative: 1 % (ref 0–5)
HCT: 38.8 % (ref 36.0–46.0)
Lymphocytes Relative: 17 % (ref 12–46)
MCV: 86.2 fL (ref 78.0–100.0)
Neutro Abs: 9.9 10*3/uL — ABNORMAL HIGH (ref 1.7–7.7)
Neutrophils Relative %: 75 % (ref 43–77)
Platelets: 315 10*3/uL (ref 150–400)
RDW: 13.4 % (ref 11.5–15.5)
WBC: 13.1 10*3/uL — ABNORMAL HIGH (ref 4.0–10.5)

## 2013-07-28 LAB — LIPASE, BLOOD: Lipase: 36 U/L (ref 11–59)

## 2013-07-28 LAB — URINE MICROSCOPIC-ADD ON

## 2013-07-28 LAB — BASIC METABOLIC PANEL
Creatinine, Ser: 1.06 mg/dL (ref 0.50–1.10)
GFR calc Af Amer: 73 mL/min — ABNORMAL LOW (ref >90)
GFR calc non Af Amer: 63 mL/min — ABNORMAL LOW (ref >90)
Potassium: 4.4 mEq/L (ref 3.7–5.3)
Sodium: 131 mEq/L — ABNORMAL LOW (ref 137–147)

## 2013-07-28 MED ORDER — INSULIN ASPART 100 UNIT/ML ~~LOC~~ SOLN: 8.0000 [IU] | Freq: Once | SUBCUTANEOUS | Status: DC

## 2013-07-28 MED ORDER — TRAMADOL HCL 50 MG PO TABS: 50.0000 mg | ORAL_TABLET | Freq: Four times a day (QID) | ORAL | Status: DC | PRN

## 2013-07-28 MED ORDER — INSULIN ASPART 100 UNIT/ML ~~LOC~~ SOLN
8.0000 [IU] | Freq: Once | SUBCUTANEOUS | Status: AC
  Administered 2013-07-28: 8 [IU] via SUBCUTANEOUS

## 2013-07-28 MED ORDER — CIPROFLOXACIN HCL 500 MG PO TABS: 500.0000 mg | ORAL_TABLET | Freq: Two times a day (BID) | ORAL | Status: DC

## 2013-07-28 MED ORDER — SODIUM CHLORIDE 0.9 % IV BOLUS (SEPSIS): 500.0000 mL | Freq: Once | INTRAVENOUS | Status: DC

## 2013-07-28 MED ORDER — IOHEXOL 300 MG/ML  SOLN
50.0000 mL | Freq: Once | INTRAMUSCULAR | Status: AC | PRN
  Administered 2013-07-28: 50 mL via ORAL

## 2013-07-28 NOTE — ED Notes (Signed)
Pt. Reports abdominal pain starting a month ago following gall bladder surgery. Pt. Reports flank pain x1 month.

## 2013-07-28 NOTE — ED Notes (Signed)
IV attempts have been made x 8 by several RNs on staff without success.  Dr. Alvino Chapel made aware and states we will do oral contrast only CT scan.  CT tech made aware.  Patient has 1/2 bottle left to drink of oral contrast.

## 2013-07-28 NOTE — ED Notes (Signed)
Patient awaiting transport to CT scan; notified by CT tech that she would be picked up at 2030.

## 2013-07-28 NOTE — ED Provider Notes (Signed)
CSN: [id number]     Arrival date & time 07/28/13  1750 History  This chart was scribed for [employer]. Alvino Chapel, MD by Mercy Moore, ED scribe.  This patient was seen in room APA04/APA04 and the patient's care was started at 6:18 PM.    Chief Complaint  Patient presents with  . Abdominal Pain  . Flank Pain    The history is provided by the patient. No language interpreter was used.   HPI Comments: Kara Knapp is a 42 y.o. female who presents to the Emergency Department complaining of waxing and waning, LUQ abdominal pain and flank pain that has persisted for a month. Patient shares that she experienced this pain both before and following a recent cholecystectomy. The surgery did not relieve pain. Patient says the pain is so severe that it prevents her from sleeping. Patient does report frequency on urination, but does not report any pain. Patient also shares that she has had a decreased appetite since her surgery. Patient denies any nausea, vomiting, fever, chills, or difficulty breathing. Patient has prescription for Tramadol, but has not taken it for the past week.   Surgeon Dr. Arnoldo Morale  Past Medical History  Diagnosis Date  . Diabetes mellitus   . Neuropathy   . Asthma   . Hypertension   . Osteomyelitis     left middle toe    Past Surgical History  Procedure Laterality Date  . Cesarean section    . Tubal ligation    . Cholecystectomy N/A 05/25/2013    Procedure: LAPAROSCOPIC CHOLECYSTECTOMY;  Surgeon: Jamesetta So, MD;  Location: [facility];  Service: General;  Laterality: N/A;   Family History  Problem Relation Age of Onset  . COPD Mother   . Cancer Father   . Diabetes Sister   . Diabetes Brother    History  Substance Use Topics  . Smoking status: Current Every Day Smoker -- 0.50 packs/day for 20 years    Types: Cigarettes  . Smokeless tobacco: Not on file  . Alcohol Use: No   OB History   Grav Para Term Preterm Abortions TAB SAB Ect Mult Living   5 5              Review of Systems  Constitutional: Positive for appetite change. Negative for fever and chills.  Respiratory: Negative for cough and shortness of breath.   Cardiovascular: Negative for chest pain.  Gastrointestinal: Positive for abdominal pain. Negative for nausea, vomiting and diarrhea.  Genitourinary: Positive for frequency and flank pain. Negative for difficulty urinating.  Musculoskeletal: Positive for back pain.  Neurological: Negative for syncope.   A complete 10 system review of systems was obtained and all systems are negative except as noted in the HPI and PMH.   Allergies  Bactrim and Prednisone  Home Medications   Current Outpatient Rx  Name  Route  Sig  Dispense  Refill  . acetaminophen (TYLENOL) 500 MG tablet   Oral   Take 1,000 mg by mouth every 6 (six) hours as needed. For pain         . budesonide-formoterol (SYMBICORT) 160-4.5 MCG/ACT inhaler   Inhalation   Inhale 2 puffs into the lungs 2 (two) times daily as needed (shortness of breath).         . clindamycin (CLEOCIN) 150 MG capsule   Oral   Take 300 mg by mouth 3 (three) times daily. 10 day course starting on 07/09/2013         .  gabapentin (NEURONTIN) 300 MG capsule   Oral   Take 600 mg by mouth 3 (three) times daily.          Marland Kitchen glipiZIDE (GLUCOTROL) 10 MG tablet   Oral   Take 20 mg by mouth 2 (two) times daily.         . hydrALAZINE (APRESOLINE) 25 MG tablet   Oral   Take 25 mg by mouth 3 (three) times daily.          . insulin glargine (LANTUS) 100 UNIT/ML injection   Subcutaneous   Inject 40 Units into the skin at bedtime.         . Lactobacillus (ACIDOPHILUS PO)   Oral   Take 1 capsule by mouth daily.         Marland Kitchen lisinopril (PRINIVIL,ZESTRIL) 10 MG tablet   Oral   Take 10 mg by mouth daily.         . metFORMIN (GLUCOPHAGE) 1000 MG tablet   Oral   Take 1,000 mg by mouth 2 (two) times daily with a meal.          . ciprofloxacin (CIPRO) 500 MG tablet   Oral    Take 1 tablet (500 mg total) by mouth 2 (two) times daily.   6 tablet   0   . traMADol (ULTRAM) 50 MG tablet   Oral   Take 1 tablet (50 mg total) by mouth every 6 (six) hours as needed for moderate pain.   15 tablet   0    Triage Vitals: BP 176/80  Pulse 87  Temp(Src) 98.1 F (36.7 C) (Oral)  Resp 18  Ht 5' 3"  (1.6 m)  Wt 175 lb (79.379 kg)  BMI 31.01 kg/m2  SpO2 100%  LMP 07/09/2013 Physical Exam  Nursing note and vitals reviewed. Constitutional: She is oriented to person, place, and time. She appears well-developed and well-nourished. No distress.  HENT:  Head: Normocephalic and atraumatic.  Eyes: Conjunctivae are normal. Right eye exhibits no discharge. Left eye exhibits no discharge.  Neck: Normal range of motion.  Cardiovascular: Normal rate, regular rhythm and normal heart sounds.   No murmur heard. Pulmonary/Chest: Effort normal and breath sounds normal. No respiratory distress. She has no wheezes. She has no rales.  Abdominal: Soft. She exhibits no mass. There is tenderness. There is no rebound and no guarding. No hernia.  Mild LUQ tenderness.   Musculoskeletal: Normal range of motion. She exhibits no edema.  Neurological: She is alert and oriented to person, place, and time.  Skin: Skin is warm and dry.  No jaundice.   Psychiatric: She has a normal mood and affect. Thought content normal.    ED Course  Procedures (including critical care time)\ DIAGNOSTIC STUDIES: Oxygen Saturation is 100% on room air, normal by my interpretation.    COORDINATION OF CARE: 6:22PM- Discussed plans for CT abdomen and blood work, UA, IV fluids. Pt advised of plan for treatment and pt agrees.  Labs Review Labs Reviewed  CBC WITH DIFFERENTIAL - Abnormal; Notable for the following:    WBC 13.1 (*)    Neutro Abs 9.9 (*)    All other components within normal limits  HEPATIC FUNCTION PANEL - Abnormal; Notable for the following:    Albumin 3.1 (*)    Total Bilirubin <0.2 (*)     All other components within normal limits  URINALYSIS, ROUTINE W REFLEX MICROSCOPIC - Abnormal; Notable for the following:    APPearance HAZY (*)    Glucose,  UA >1000 (*)    Hgb urine dipstick MODERATE (*)    Protein, ur 100 (*)    All other components within normal limits  URINE MICROSCOPIC-ADD ON - Abnormal; Notable for the following:    Bacteria, UA MANY (*)    All other components within normal limits  BASIC METABOLIC PANEL - Abnormal; Notable for the following:    Sodium 131 (*)    Chloride 92 (*)    Glucose, Bld 484 (*)    GFR calc non Af Amer 63 (*)    GFR calc Af Amer 73 (*)    All other components within normal limits  URINE CULTURE  LIPASE, BLOOD   Imaging Review Ct Abdomen Pelvis Wo Contrast  07/28/2013   CLINICAL DATA:  Left upper quadrant abdominal pain for 1 month. History of diabetes, hypertension and cholecystectomy.  EXAM: CT ABDOMEN AND PELVIS WITHOUT CONTRAST  TECHNIQUE: Multidetector CT imaging of the abdomen and pelvis was performed following the standard protocol without intravenous contrast. IV access could not be obtained.  COMPARISON:  Abdominal ultrasound 05/21/2013.  FINDINGS: The visualized lung bases are clear. There is no significant pleural or pericardial effusion.  There are surgical clips consistent with recent cholecystectomy. There is an oval-shaped fluid collection in the cholecystectomy bed measuring 3.1 cm maximally. No significant surrounding inflammatory change is identified. There is no ascites or other extraluminal fluid collection.  As evaluated in the noncontrast state, the liver, spleen, pancreas, adrenal glands and kidneys appear normal. There is no hydronephrosis.  The stomach, small bowel, appendix and colon appear normal. The uterus is enlarged and lobular in contour consistent with fibroids. There is air within the urinary bladder lumen. No bladder wall thickening is apparent. There is no adnexal mass.  There are no worrisome osseous findings.   IMPRESSION: 1. Nonspecific small fluid collection within the cholecystectomy bed. There is no ascites to suggest bile leak. 2. No explanation for left upper quadrant abdominal pain identified. 3. Fibroid uterus. 4. Air within the urinary bladder lumen, presumably from recent bladder catheterization -correlate clinically.   Electronically Signed   By: Camie Patience M.D.   On: 07/28/2013 20:54    EKG Interpretation   None       MDM   1. Hyperglycemia   2. UTI (urinary tract infection)    Patient with abdominal pain. Has had history of same since surgery. CT scan done is reassuring. There was a fluid collection that may be followed, Her however tenderness and pain is in the left upper quadrant. She does have hyperglycemia and has been off her insulin. She was a difficult IV access and no IV was obtained. Patient was given subcutaneous insulin. She did not want to wait further to resolve his sugar. She states she will followed at home. She had also lost her meter. She states she has appointment with her primary care Dr. she also has apparent UTI. She is well-appearing. She was given antibiotics and will be discharged home.  I personally performed the services described in this documentation, which was scribed in my presence. The recorded information has been reviewed and is accurate.       Jasper Riling. Alvino Chapel, MD 07/28/13 2249

## 2013-07-30 LAB — URINE CULTURE: Colony Count: >=100000

## 2013-08-09 ENCOUNTER — Encounter (HOSPITAL_COMMUNITY): Payer: Self-pay | Admitting: Emergency Medicine

## 2013-08-09 ENCOUNTER — Emergency Department (HOSPITAL_COMMUNITY)
Admission: EM | Admit: 2013-08-09 | Discharge: 2013-08-09 | Disposition: A | Discharge status: Home / Self Care | Payer: Medicaid Other | Attending: Emergency Medicine | Admitting: Emergency Medicine

## 2013-08-09 DIAGNOSIS — G8918 Other acute postprocedural pain: Secondary | ICD-10-CM | POA: Insufficient documentation

## 2013-08-09 DIAGNOSIS — J45909 Unspecified asthma, uncomplicated: Secondary | ICD-10-CM | POA: Insufficient documentation

## 2013-08-09 DIAGNOSIS — R739 Hyperglycemia, unspecified: Secondary | ICD-10-CM

## 2013-08-09 DIAGNOSIS — Z9089 Acquired absence of other organs: Secondary | ICD-10-CM | POA: Insufficient documentation

## 2013-08-09 DIAGNOSIS — Z794 Long term (current) use of insulin: Secondary | ICD-10-CM | POA: Insufficient documentation

## 2013-08-09 DIAGNOSIS — Z3202 Encounter for pregnancy test, result negative: Secondary | ICD-10-CM | POA: Insufficient documentation

## 2013-08-09 DIAGNOSIS — R1012 Left upper quadrant pain: Secondary | ICD-10-CM | POA: Insufficient documentation

## 2013-08-09 DIAGNOSIS — I10 Essential (primary) hypertension: Secondary | ICD-10-CM | POA: Insufficient documentation

## 2013-08-09 DIAGNOSIS — F17200 Nicotine dependence, unspecified, uncomplicated: Secondary | ICD-10-CM | POA: Insufficient documentation

## 2013-08-09 DIAGNOSIS — E119 Type 2 diabetes mellitus without complications: Secondary | ICD-10-CM | POA: Insufficient documentation

## 2013-08-09 DIAGNOSIS — R1011 Right upper quadrant pain: Secondary | ICD-10-CM | POA: Insufficient documentation

## 2013-08-09 DIAGNOSIS — R109 Unspecified abdominal pain: Secondary | ICD-10-CM

## 2013-08-09 DIAGNOSIS — Z8739 Personal history of other diseases of the musculoskeletal system and connective tissue: Secondary | ICD-10-CM | POA: Insufficient documentation

## 2013-08-09 DIAGNOSIS — G589 Mononeuropathy, unspecified: Secondary | ICD-10-CM | POA: Insufficient documentation

## 2013-08-09 DIAGNOSIS — R110 Nausea: Secondary | ICD-10-CM | POA: Insufficient documentation

## 2013-08-09 DIAGNOSIS — Z79899 Other long term (current) drug therapy: Secondary | ICD-10-CM | POA: Insufficient documentation

## 2013-08-09 LAB — COMPREHENSIVE METABOLIC PANEL
ALBUMIN: 2.8 g/dL — AB (ref 3.5–5.2)
ALT: 9 U/L (ref 0–35)
AST: 13 U/L (ref 0–37)
Alkaline Phosphatase: 90 U/L (ref 39–117)
BUN: 15 mg/dL (ref 6–23)
CO2: 24 meq/L (ref 19–32)
CREATININE: 1.12 mg/dL — AB (ref 0.50–1.10)
Calcium: 9.3 mg/dL (ref 8.4–10.5)
Chloride: 97 mEq/L (ref 96–112)
GFR calc Af Amer: 69 mL/min — ABNORMAL LOW (ref >90)
GFR, EST NON AFRICAN AMERICAN: 59 mL/min — AB (ref >90)
Glucose, Bld: 214 mg/dL — ABNORMAL HIGH (ref 70–99)
Potassium: 4.4 mEq/L (ref 3.7–5.3)
SODIUM: 133 meq/L — AB (ref 137–147)
TOTAL PROTEIN: 7 g/dL (ref 6.0–8.3)
Total Bilirubin: <0.2 mg/dL — ABNORMAL LOW (ref 0.3–1.2)

## 2013-08-09 LAB — URINALYSIS, ROUTINE W REFLEX MICROSCOPIC
BILIRUBIN URINE: NEGATIVE
GLUCOSE, UA: 500 mg/dL — AB
Ketones, ur: NEGATIVE mg/dL
LEUKOCYTES UA: NEGATIVE
Nitrite: NEGATIVE
PH: 6 (ref 5.0–8.0)
Protein, ur: >300 mg/dL — AB
Specific Gravity, Urine: 1.025 (ref 1.005–1.030)
Urobilinogen, UA: 0.2 mg/dL (ref 0.0–1.0)

## 2013-08-09 LAB — CBC WITH DIFFERENTIAL/PLATELET
BASOS ABS: 0.1 10*3/uL (ref 0.0–0.1)
Basophils Relative: 0 % (ref 0–1)
Eosinophils Absolute: 0.1 10*3/uL (ref 0.0–0.7)
Eosinophils Relative: 1 % (ref 0–5)
HCT: 36.2 % (ref 36.0–46.0)
HEMOGLOBIN: 12.5 g/dL (ref 12.0–15.0)
Lymphocytes Relative: 16 % (ref 12–46)
Lymphs Abs: 2.3 10*3/uL (ref 0.7–4.0)
MCH: 29.6 pg (ref 26.0–34.0)
MCHC: 34.5 g/dL (ref 30.0–36.0)
MCV: 85.8 fL (ref 78.0–100.0)
MONO ABS: 0.8 10*3/uL (ref 0.1–1.0)
Monocytes Relative: 5 % (ref 3–12)
NEUTROS ABS: 11.1 10*3/uL — AB (ref 1.7–7.7)
Neutrophils Relative %: 78 % — ABNORMAL HIGH (ref 43–77)
Platelets: 337 10*3/uL (ref 150–400)
RBC: 4.22 MIL/uL (ref 3.87–5.11)
RDW: 13.6 % (ref 11.5–15.5)
WBC: 14.3 10*3/uL — AB (ref 4.0–10.5)

## 2013-08-09 LAB — PREGNANCY, URINE: Preg Test, Ur: NEGATIVE

## 2013-08-09 LAB — LIPASE, BLOOD: Lipase: 26 U/L (ref 11–59)

## 2013-08-09 LAB — GLUCOSE, CAPILLARY: Glucose-Capillary: 220 mg/dL — ABNORMAL HIGH (ref 70–99)

## 2013-08-09 LAB — URINE MICROSCOPIC-ADD ON

## 2013-08-09 MED ORDER — ONDANSETRON 8 MG PO TBDP
8.0000 mg | ORAL_TABLET | Freq: Once | ORAL | Status: AC
  Administered 2013-08-09: 8 mg via ORAL
  Filled 2013-08-09: qty 1

## 2013-08-09 MED ORDER — OXYCODONE-ACETAMINOPHEN 5-325 MG PO TABS
2.0000 | ORAL_TABLET | Freq: Once | ORAL | Status: AC
  Administered 2013-08-09: 2 via ORAL
  Filled 2013-08-09: qty 2

## 2013-08-09 NOTE — ED Notes (Signed)
Pt c/o back pain and abd pain since gall bladder surgery in oct of last year.  Pt says had UTI 2 weeks ago and finished antibiotics.  Pt says pain came back yesterday.

## 2013-08-09 NOTE — Discharge Instructions (Signed)
Abdominal (belly) pain can be caused by many things. any cases can be observed and treated at home after initial evaluation in the emergency department. Even though you are being discharged home, abdominal pain can be unpredictable. Therefore, you need a repeated exam if your pain does not resolve, returns, or worsens. Most patients with abdominal pain don't have to be admitted to the hospital or have surgery, but serious problems like appendicitis and gallbladder attacks can start out as nonspecific pain. Many abdominal conditions cannot be diagnosed in one visit, so follow-up evaluations are very important. SEEK IMMEDIATE MEDICAL ATTENTION IF: The pain does not go away or becomes severe, particularly over the next 8-12 hours.  A temperature above 100.62F develops.  Repeated vomiting occurs (multiple episodes).  The pain becomes localized to portions of the abdomen. The right side could possibly be appendicitis. In an adult, the left lower portion of the abdomen could be colitis or diverticulitis.  Blood is being passed in stools or vomit (bright red or black tarry stools).  Return also if you develop chest pain, difficulty breathing, dizziness or fainting, or become confused, poorly responsive

## 2013-08-09 NOTE — ED Notes (Signed)
Patient states that she has had pain in the right and left upper quadrant of her abdomen that radiates to her back. Patient states it started yesterday. Patient denies nausea, vomiting, diarrhea, pain with urination, and bleeding with urination.

## 2013-08-09 NOTE — ED Provider Notes (Signed)
CSN: [id number]     Arrival date & time 08/09/13  1314 History   First MD Initiated Contact with Patient 08/09/13 1332     Chief Complaint  Patient presents with  . Back Pain  . Abdominal Pain    Patient is a 43 y.o. female presenting with abdominal pain. The history is provided by the patient.  Abdominal Pain Pain location:  LUQ and RUQ Pain quality: aching   Pain severity:  Moderate Onset quality:  Gradual Duration: "since my surgery" Timing:  Intermittent Progression:  Worsening Chronicity:  Recurrent Relieved by:  Nothing Worsened by:  Palpation Associated symptoms: nausea   Associated symptoms: no chest pain, no constipation, no diarrhea, no dysuria, no fever, no melena, no vaginal bleeding, no vaginal discharge and no vomiting     Past Medical History  Diagnosis Date  . Diabetes mellitus   . Neuropathy   . Asthma   . Hypertension   . Osteomyelitis     left middle toe    Past Surgical History  Procedure Laterality Date  . Cesarean section    . Tubal ligation    . Cholecystectomy N/A 05/25/2013    Procedure: LAPAROSCOPIC CHOLECYSTECTOMY;  Surgeon: Jamesetta So, MD;  Location: [facility];  Service: General;  Laterality: N/A;   Family History  Problem Relation Age of Onset  . COPD Mother   . Cancer Father   . Diabetes Sister   . Diabetes Brother    History  Substance Use Topics  . Smoking status: Current Every Day Smoker -- 0.50 packs/day for 20 years    Types: Cigarettes  . Smokeless tobacco: Not on file  . Alcohol Use: No   OB History   Grav Para Term Preterm Abortions TAB SAB Ect Mult Living   5 5             Review of Systems  Constitutional: Negative for fever.  Cardiovascular: Negative for chest pain.  Gastrointestinal: Positive for nausea and abdominal pain. Negative for vomiting, diarrhea, constipation, blood in stool and melena.  Genitourinary: Negative for dysuria, vaginal bleeding and vaginal discharge.  All other systems reviewed and are  negative.    Allergies  Bactrim and Prednisone  Home Medications   Current Outpatient Rx  Name  Route  Sig  Dispense  Refill  . acetaminophen (TYLENOL) 500 MG tablet   Oral   Take 1,000 mg by mouth every 6 (six) hours as needed. For pain         . budesonide-formoterol (SYMBICORT) 160-4.5 MCG/ACT inhaler   Inhalation   Inhale 2 puffs into the lungs 2 (two) times daily as needed (shortness of breath).         . gabapentin (NEURONTIN) 300 MG capsule   Oral   Take 600 mg by mouth 3 (three) times daily.          . hydrALAZINE (APRESOLINE) 25 MG tablet   Oral   Take 25 mg by mouth 2 (two) times daily.          . insulin glargine (LANTUS) 100 UNIT/ML injection   Subcutaneous   Inject 40 Units into the skin at bedtime.         . metFORMIN (GLUCOPHAGE) 1000 MG tablet   Oral   Take 1,000 mg by mouth 2 (two) times daily with a meal.           BP 129/78  Pulse 88  Temp(Src) 98 F (36.7 C) (Oral)  Resp 16  Ht  5' 3"  (1.6 m)  Wt 172 lb (78.019 kg)  BMI 30.48 kg/m2  SpO2 97%  LMP 08/03/2013 Physical Exam CONSTITUTIONAL: Well developed/well nourished HEAD: Normocephalic/atraumatic EYES: EOMI/PERRL, ?mild scleral icterus ENMT: Mucous membranes moist NECK: supple no meningeal signs SPINE:entire spine nontender CV: S1/S2 noted, no murmurs/rubs/gallops noted LUNGS: Lungs are clear to auscultation bilaterally, no apparent distress ABDOMEN: soft, mild LUQ tenderness, no rebound or guarding GU:no cva tenderness NEURO: Pt is awake/alert, moves all extremitiesx4 EXTREMITIES: pulses normal, full ROM SKIN: warm, color normal PSYCH: no abnormalities of mood noted  ED Course  Procedures (including critical care time) Labs Review Labs Reviewed  URINALYSIS, ROUTINE W REFLEX MICROSCOPIC - Abnormal; Notable for the following:    APPearance HAZY (*)    Glucose, UA 500 (*)    Hgb urine dipstick TRACE (*)    Protein, ur >300 (*)    All other components within normal  limits  GLUCOSE, CAPILLARY - Abnormal; Notable for the following:    Glucose-Capillary 220 (*)    All other components within normal limits  COMPREHENSIVE METABOLIC PANEL - Abnormal; Notable for the following:    Sodium 133 (*)    Glucose, Bld 214 (*)    Creatinine, Ser 1.12 (*)    Albumin 2.8 (*)    Total Bilirubin <0.2 (*)    GFR calc non Af Amer 59 (*)    GFR calc Af Amer 69 (*)    All other components within normal limits  CBC WITH DIFFERENTIAL - Abnormal; Notable for the following:    WBC 14.3 (*)    Neutrophils Relative % 78 (*)    Neutro Abs 11.1 (*)    All other components within normal limits  URINE MICROSCOPIC-ADD ON - Abnormal; Notable for the following:    Squamous Epithelial / LPF FEW (*)    Bacteria, UA FEW (*)    All other components within normal limits  PREGNANCY, URINE  LIPASE, BLOOD   Imaging Review No results found.  EKG Interpretation   None       MDM   1. Abdominal pain   2. Hyperglycemia    Nursing notes including past medical history and social history reviewed and considered in documentation Labs/vital reviewed and considered Previous records reviewed and considered   Pt reports she has essentially had this type of abdominal pain since her cholecystectomy last October.  She has recent negative CT imaging LFT/lipase reassuring, mild hyperglycemia noted I doubt acute abdominal process  advised need for outpatient management by PCP and her general surgeon She has no pelvic complaints or lower abdominal tenderness Discussed strict return precautions and pt has ride home   Sharyon Cable, MD 08/09/13 1537

## 2013-08-22 ENCOUNTER — Emergency Department (HOSPITAL_COMMUNITY)
Admission: EM | Admit: 2013-08-22 | Discharge: 2013-08-22 | Disposition: A | Discharge status: Home / Self Care | Payer: Medicaid Other | Attending: Emergency Medicine | Admitting: Emergency Medicine

## 2013-08-22 ENCOUNTER — Encounter (HOSPITAL_COMMUNITY): Payer: Self-pay | Admitting: Emergency Medicine

## 2013-08-22 DIAGNOSIS — I10 Essential (primary) hypertension: Secondary | ICD-10-CM | POA: Insufficient documentation

## 2013-08-22 DIAGNOSIS — E119 Type 2 diabetes mellitus without complications: Secondary | ICD-10-CM | POA: Insufficient documentation

## 2013-08-22 DIAGNOSIS — M549 Dorsalgia, unspecified: Secondary | ICD-10-CM | POA: Insufficient documentation

## 2013-08-22 DIAGNOSIS — Z9089 Acquired absence of other organs: Secondary | ICD-10-CM | POA: Insufficient documentation

## 2013-08-22 DIAGNOSIS — Z79899 Other long term (current) drug therapy: Secondary | ICD-10-CM | POA: Insufficient documentation

## 2013-08-22 DIAGNOSIS — G589 Mononeuropathy, unspecified: Secondary | ICD-10-CM | POA: Insufficient documentation

## 2013-08-22 DIAGNOSIS — J45909 Unspecified asthma, uncomplicated: Secondary | ICD-10-CM | POA: Insufficient documentation

## 2013-08-22 DIAGNOSIS — R1013 Epigastric pain: Secondary | ICD-10-CM | POA: Insufficient documentation

## 2013-08-22 DIAGNOSIS — F17200 Nicotine dependence, unspecified, uncomplicated: Secondary | ICD-10-CM | POA: Insufficient documentation

## 2013-08-22 DIAGNOSIS — Z794 Long term (current) use of insulin: Secondary | ICD-10-CM | POA: Insufficient documentation

## 2013-08-22 DIAGNOSIS — R1012 Left upper quadrant pain: Secondary | ICD-10-CM

## 2013-08-22 MED ORDER — OXYCODONE-ACETAMINOPHEN 5-325 MG PO TABS: 2.0000 | ORAL_TABLET | ORAL | Status: DC | PRN

## 2013-08-22 MED ORDER — SUCRALFATE 1 G PO TABS: 1.0000 g | ORAL_TABLET | Freq: Four times a day (QID) | ORAL | Status: DC

## 2013-08-22 NOTE — ED Provider Notes (Signed)
CSN: [id number]     Arrival date & time 08/22/13  1305 History   First MD Initiated Contact with Patient 08/22/13 1332     Chief Complaint  Patient presents with  . Abdominal Pain  . Back Pain   (Consider location/radiation/quality/duration/timing/severity/associated sxs/prior Treatment) Patient is a 43 y.o. female presenting with abdominal pain and back pain. The history is provided by the patient.  Abdominal Pain Back Pain Associated symptoms: abdominal pain    patient here complaining of persistent left upper quadrant and epigastric pain since her gallbladder operation 3 months ago. Pain characterized as sharp and worse with eating at times. No recent fever, vomiting, black or bloody stools. Seen at a [facility] facility 13 days ago for similar symptoms with a negative workup. She denies any urinary symptoms. Patient's current symptoms are exactly the same as they have been for the past 3 months. She has used Motrin which has helped her symptoms at times. She denies any vaginal bleeding or discharge. She scheduled to see the surgeon next week.  Past Medical History  Diagnosis Date  . Diabetes mellitus   . Neuropathy   . Asthma   . Hypertension   . Osteomyelitis     left middle toe    Past Surgical History  Procedure Laterality Date  . Cesarean section    . Tubal ligation    . Cholecystectomy N/A 05/25/2013    Procedure: LAPAROSCOPIC CHOLECYSTECTOMY;  Surgeon: Jamesetta So, MD;  Location: [facility];  Service: General;  Laterality: N/A;   Family History  Problem Relation Age of Onset  . COPD Mother   . Cancer Father   . Diabetes Sister   . Diabetes Brother    History  Substance Use Topics  . Smoking status: Current Every Day Smoker -- 0.50 packs/day for 20 years    Types: Cigarettes  . Smokeless tobacco: Not on file  . Alcohol Use: No   OB History   Grav Para Term Preterm Abortions TAB SAB Ect Mult Living   5 5             Review of Systems  Gastrointestinal:  Positive for abdominal pain.  Musculoskeletal: Positive for back pain.  All other systems reviewed and are negative.    Allergies  Bactrim and Prednisone  Home Medications   Current Outpatient Rx  Name  Route  Sig  Dispense  Refill  . acetaminophen (TYLENOL) 500 MG tablet   Oral   Take 1,000 mg by mouth every 6 (six) hours as needed. For pain         . budesonide-formoterol (SYMBICORT) 160-4.5 MCG/ACT inhaler   Inhalation   Inhale 2 puffs into the lungs 2 (two) times daily as needed (shortness of breath).         . gabapentin (NEURONTIN) 300 MG capsule   Oral   Take 600 mg by mouth 3 (three) times daily.          . hydrALAZINE (APRESOLINE) 25 MG tablet   Oral   Take 25 mg by mouth 2 (two) times daily.          . insulin glargine (LANTUS) 100 UNIT/ML injection   Subcutaneous   Inject 40 Units into the skin at bedtime.         . metFORMIN (GLUCOPHAGE) 1000 MG tablet   Oral   Take 1,000 mg by mouth 2 (two) times daily with a meal.           BP 103/63  Pulse 79  Temp(Src) 98.4 F (36.9 C) (Oral)  Resp 20  SpO2 98%  LMP 08/03/2013 Physical Exam  Nursing note and vitals reviewed. Constitutional: She is oriented to person, place, and time. She appears well-developed and well-nourished.  Non-toxic appearance. No distress.  HENT:  Head: Normocephalic and atraumatic.  Eyes: Conjunctivae, EOM and lids are normal. Pupils are equal, round, and reactive to light.  Neck: Normal range of motion. Neck supple. No tracheal deviation present. No mass present.  Cardiovascular: Normal rate, regular rhythm and normal heart sounds.  Exam reveals no gallop.   No murmur heard. Pulmonary/Chest: Effort normal and breath sounds normal. No stridor. No respiratory distress. She has no decreased breath sounds. She has no wheezes. She has no rhonchi. She has no rales.  Abdominal: Soft. Normal appearance and bowel sounds are normal. She exhibits no distension. There is tenderness in  the epigastric area and left upper quadrant. There is no rigidity, no rebound, no guarding and no CVA tenderness.  Musculoskeletal: Normal range of motion. She exhibits no edema and no tenderness.  Neurological: She is alert and oriented to person, place, and time. She has normal strength. No cranial nerve deficit or sensory deficit. GCS eye subscore is 4. GCS verbal subscore is 5. GCS motor subscore is 6.  Skin: Skin is warm and dry. No abrasion and no rash noted.  Psychiatric: She has a normal mood and affect. Her speech is normal and behavior is normal.    ED Course  Procedures (including critical care time) Labs Review Labs Reviewed - No data to display Imaging Review No results found.  EKG Interpretation   None       MDM  No diagnosis found. Patient with chronic abdominal pain since her gallbladder surgery 3 months ago. She has no peritoneal signs. No signs of surgical abdomen at this time. She states that her current pain is the same as it has been for the past 3 months. She is afebrile and her vital signs are stable. Will not perform labs and imaging at this time. Will prescribe course of pain medication and she will see her Dr. in 5 days. Strict return precautions given    Leota Jacobsen, MD 08/22/13 1346

## 2013-08-22 NOTE — ED Notes (Signed)
Pt states she's had abdominal pain and back pain since October when she had a cholecystectomy.  Pt states she has been at [facility] several times, but no results.  Pt states she was told to f/u with her surgeon.  Pt has appt to see surgeon on 01/29 (Dr Arnoldo Morale in [place]).

## 2013-08-22 NOTE — Discharge Instructions (Signed)
Abdominal Pain, Women °Abdominal (stomach, pelvic, or belly) pain can be caused by many things. It is important to tell your doctor: °· The location of the pain. °· Does it come and go or is it present all the time? °· Are there things that start the pain (eating certain foods, exercise)? °· Are there other symptoms associated with the pain (fever, nausea, vomiting, diarrhea)? °All of this is helpful to know when trying to find the cause of the pain. °CAUSES  °· Stomach: virus or bacteria infection, or ulcer. °· Intestine: appendicitis (inflamed appendix), regional ileitis (Crohn's disease), ulcerative colitis (inflamed colon), irritable bowel syndrome, diverticulitis (inflamed diverticulum of the colon), or cancer of the stomach or intestine. °· Gallbladder disease or stones in the gallbladder. °· Kidney disease, kidney stones, or infection. °· Pancreas infection or cancer. °· Fibromyalgia (pain disorder). °· Diseases of the female organs: °· Uterus: fibroid (non-cancerous) tumors or infection. °· Fallopian tubes: infection or tubal pregnancy. °· Ovary: cysts or tumors. °· Pelvic adhesions (scar tissue). °· Endometriosis (uterus lining tissue growing in the pelvis and on the pelvic organs). °· Pelvic congestion syndrome (female organs filling up with blood just before the menstrual period). °· Pain with the menstrual period. °· Pain with ovulation (producing an egg). °· Pain with an IUD (intrauterine device, birth control) in the uterus. °· Cancer of the female organs. °· Functional pain (pain not caused by a disease, may improve without treatment). °· Psychological pain. °· Depression. °DIAGNOSIS  °Your doctor will decide the seriousness of your pain by doing an examination. °· Blood tests. °· X-rays. °· Ultrasound. °· CT scan (computed tomography, special type of X-ray). °· MRI (magnetic resonance imaging). °· Cultures, for infection. °· Barium enema (dye inserted in the large intestine, to better view it with  X-rays). °· Colonoscopy (looking in intestine with a lighted tube). °· Laparoscopy (minor surgery, looking in abdomen with a lighted tube). °· Major abdominal exploratory surgery (looking in abdomen with a large incision). °TREATMENT  °The treatment will depend on the cause of the pain.  °· Many cases can be observed and treated at home. °· Over-the-counter medicines recommended by your caregiver. °· Prescription medicine. °· Antibiotics, for infection. °· Birth control pills, for painful periods or for ovulation pain. °· Hormone treatment, for endometriosis. °· Nerve blocking injections. °· Physical therapy. °· Antidepressants. °· Counseling with a psychologist or psychiatrist. °· Minor or major surgery. °HOME CARE INSTRUCTIONS  °· Do not take laxatives, unless directed by your caregiver. °· Take over-the-counter pain medicine only if ordered by your caregiver. Do not take aspirin because it can cause an upset stomach or bleeding. °· Try a clear liquid diet (broth or water) as ordered by your caregiver. Slowly move to a bland diet, as tolerated, if the pain is related to the stomach or intestine. °· Have a thermometer and take your temperature several times a day, and record it. °· Bed rest and sleep, if it helps the pain. °· Avoid sexual intercourse, if it causes pain. °· Avoid stressful situations. °· Keep your follow-up appointments and tests, as your caregiver orders. °· If the pain does not go away with medicine or surgery, you may try: °· Acupuncture. °· Relaxation exercises (yoga, meditation). °· Group therapy. °· Counseling. °SEEK MEDICAL CARE IF:  °· You notice certain foods cause stomach pain. °· Your home care treatment is not helping your pain. °· You need stronger pain medicine. °· You want your IUD removed. °· You feel faint or   lightheaded. °· You develop nausea and vomiting. °· You develop a rash. °· You are having side effects or an allergy to your medicine. °SEEK IMMEDIATE MEDICAL CARE IF:  °· Your  pain does not go away or gets worse. °· You have a fever. °· Your pain is felt only in portions of the abdomen. The right side could possibly be appendicitis. The left lower portion of the abdomen could be colitis or diverticulitis. °· You are passing blood in your stools (bright red or black tarry stools, with or without vomiting). °· You have blood in your urine. °· You develop chills, with or without a fever. °· You pass out. °MAKE SURE YOU:  °· Understand these instructions. °· Will watch your condition. °· Will get help right away if you are not doing well or get worse. °Document Released: 05/13/2007 Document Revised: 10/08/2011 Document Reviewed: 06/02/2009 °[employer]® Patient Information ©2014 [employer]. ° °

## 2013-08-22 NOTE — ED Notes (Signed)
MD Zenia Resides at bedside. Pt reports continued RUQ and LUQ pain since surgery in October. Denies any recent injury or changes, but continued discomfort/pain. Pt in NAD.

## 2013-12-10 ENCOUNTER — Encounter (HOSPITAL_COMMUNITY): Payer: Self-pay | Admitting: Emergency Medicine

## 2013-12-10 ENCOUNTER — Emergency Department (HOSPITAL_COMMUNITY)
Admission: EM | Admit: 2013-12-10 | Discharge: 2013-12-10 | Disposition: A | Discharge status: Home / Self Care | Payer: Medicaid Other | Attending: Emergency Medicine | Admitting: Emergency Medicine

## 2013-12-10 DIAGNOSIS — Z794 Long term (current) use of insulin: Secondary | ICD-10-CM | POA: Insufficient documentation

## 2013-12-10 DIAGNOSIS — IMO0002 Reserved for concepts with insufficient information to code with codable children: Secondary | ICD-10-CM | POA: Insufficient documentation

## 2013-12-10 DIAGNOSIS — Z79899 Other long term (current) drug therapy: Secondary | ICD-10-CM | POA: Diagnosis not present

## 2013-12-10 DIAGNOSIS — Z8739 Personal history of other diseases of the musculoskeletal system and connective tissue: Secondary | ICD-10-CM | POA: Insufficient documentation

## 2013-12-10 DIAGNOSIS — J3489 Other specified disorders of nose and nasal sinuses: Secondary | ICD-10-CM | POA: Insufficient documentation

## 2013-12-10 DIAGNOSIS — G589 Mononeuropathy, unspecified: Secondary | ICD-10-CM | POA: Insufficient documentation

## 2013-12-10 DIAGNOSIS — R209 Unspecified disturbances of skin sensation: Secondary | ICD-10-CM | POA: Diagnosis not present

## 2013-12-10 DIAGNOSIS — E119 Type 2 diabetes mellitus without complications: Secondary | ICD-10-CM | POA: Insufficient documentation

## 2013-12-10 DIAGNOSIS — I10 Essential (primary) hypertension: Secondary | ICD-10-CM | POA: Insufficient documentation

## 2013-12-10 DIAGNOSIS — J45901 Unspecified asthma with (acute) exacerbation: Secondary | ICD-10-CM | POA: Insufficient documentation

## 2013-12-10 DIAGNOSIS — M2550 Pain in unspecified joint: Secondary | ICD-10-CM | POA: Insufficient documentation

## 2013-12-10 DIAGNOSIS — J340 Abscess, furuncle and carbuncle of nose: Secondary | ICD-10-CM

## 2013-12-10 MED ORDER — LIDOCAINE HCL (PF) 1 % IJ SOLN
INTRAMUSCULAR | Status: AC
  Filled 2013-12-10: qty 5

## 2013-12-10 MED ORDER — HYDROCODONE-ACETAMINOPHEN 5-325 MG PO TABS: 1.0000 | ORAL_TABLET | ORAL | Status: DC | PRN

## 2013-12-10 MED ORDER — PROMETHAZINE HCL 12.5 MG PO TABS
12.5000 mg | ORAL_TABLET | Freq: Once | ORAL | Status: AC
  Administered 2013-12-10: 12.5 mg via ORAL
  Filled 2013-12-10: qty 1

## 2013-12-10 MED ORDER — DOXYCYCLINE HYCLATE 100 MG PO TABS
100.0000 mg | ORAL_TABLET | Freq: Once | ORAL | Status: AC
  Administered 2013-12-10: 100 mg via ORAL
  Filled 2013-12-10: qty 1

## 2013-12-10 MED ORDER — HYDROCODONE-ACETAMINOPHEN 5-325 MG PO TABS
1.0000 | ORAL_TABLET | Freq: Once | ORAL | Status: AC
  Administered 2013-12-10: 1 via ORAL
  Filled 2013-12-10: qty 1

## 2013-12-10 MED ORDER — MUPIROCIN CALCIUM 2 % EX CREA: TOPICAL_CREAM | CUTANEOUS | Status: DC

## 2013-12-10 MED ORDER — IBUPROFEN 800 MG PO TABS
800.0000 mg | ORAL_TABLET | Freq: Once | ORAL | Status: AC
  Administered 2013-12-10: 800 mg via ORAL
  Filled 2013-12-10: qty 1

## 2013-12-10 MED ORDER — MINOCYCLINE HCL 100 MG PO CAPS: 100.0000 mg | ORAL_CAPSULE | Freq: Two times a day (BID) | ORAL | Status: DC

## 2013-12-10 MED ORDER — CEFTRIAXONE SODIUM 1 G IJ SOLR
1.0000 g | Freq: Once | INTRAMUSCULAR | Status: AC
  Administered 2013-12-10: 1 g via INTRAMUSCULAR
  Filled 2013-12-10: qty 10

## 2013-12-10 NOTE — ED Notes (Signed)
Pt with sore inside right nare of nose x 4-5 days per pt, now with redness on outside of nose, denies N/V, thinks she may had a fever last night but no fever noted in triage

## 2013-12-10 NOTE — ED Provider Notes (Signed)
CSN: [id number]     Arrival date & time 12/10/13  1023 History   First MD Initiated Contact with Patient 12/10/13 1132     Chief Complaint  Patient presents with  . Abscess     (Consider location/radiation/quality/duration/timing/severity/associated sxs/prior Treatment) HPI Comments: Patient is a 43 year old female who presents to the emergency apartment with take a chief complaint of infection of the nose. The patient states that she thinks she noticed sores on the inner aspect of her nostrils approximately a week and a half ago. She states that this progressed into increased redness of the tip of her nose. The patient was being evaluated for eye surgery, but this was postponed because the infection of the nose seemed to be progressing so much. The patient attempted to see her physician at [facility], but has been unsuccessful in getting an appointment. The patient is not sure of any temperature elevations. She states that her glucose was elevated upon her evaluation at a doctor's office recently. She presents now for assistance with this infection and with her pain.  Patient is a 43 y.o. female presenting with abscess. The history is provided by the patient.  Abscess   Past Medical History  Diagnosis Date  . Diabetes mellitus   . Neuropathy   . Asthma   . Hypertension   . Osteomyelitis     left middle toe    Past Surgical History  Procedure Laterality Date  . Cesarean section    . Tubal ligation    . Cholecystectomy N/A 05/25/2013    Procedure: LAPAROSCOPIC CHOLECYSTECTOMY;  Surgeon: Jamesetta So, MD;  Location: [facility];  Service: General;  Laterality: N/A;   Family History  Problem Relation Age of Onset  . COPD Mother   . Cancer Father   . Diabetes Sister   . Diabetes Brother    History  Substance Use Topics  . Smoking status: Current Every Day Smoker -- 0.50 packs/day for 20 years    Types: Cigarettes  . Smokeless tobacco: Not on file  . Alcohol Use: No    OB History   Grav Para Term Preterm Abortions TAB SAB Ect Mult Living   5 5             Review of Systems  Constitutional: Negative for activity change.       All ROS Neg except as noted in HPI  HENT: Negative for nosebleeds.   Eyes: Negative for photophobia and discharge.  Respiratory: Positive for wheezing. Negative for cough and shortness of breath.   Cardiovascular: Negative for chest pain and palpitations.  Gastrointestinal: Negative for abdominal pain and blood in stool.  Genitourinary: Negative for dysuria, frequency and hematuria.  Musculoskeletal: Positive for arthralgias. Negative for back pain and neck pain.  Skin: Negative.   Neurological: Positive for numbness. Negative for dizziness, seizures and speech difficulty.  Psychiatric/Behavioral: Negative for hallucinations and confusion.      Allergies  Bactrim and Prednisone  Home Medications   Prior to Admission medications   Medication Sig Start Date End Date Taking? Authorizing Provider  acetaminophen (TYLENOL) 500 MG tablet Take 1,000 mg by mouth every 6 (six) hours as needed. For pain   Yes Historical Provider, MD  budesonide-formoterol (SYMBICORT) 160-4.5 MCG/ACT inhaler Inhale 2 puffs into the lungs 2 (two) times daily as needed (shortness of breath).   Yes Historical Provider, MD  gabapentin (NEURONTIN) 300 MG capsule Take 600 mg by mouth 3 (three) times daily.    Yes Historical  Provider, MD  hydrALAZINE (APRESOLINE) 25 MG tablet Take 25 mg by mouth 2 (two) times daily.    Yes Historical Provider, MD  hydrochlorothiazide (HYDRODIURIL) 25 MG tablet Take 25 mg by mouth daily.   Yes Historical Provider, MD  insulin glargine (LANTUS) 100 UNIT/ML injection Inject 30 Units into the skin 2 (two) times daily.  05/26/13  Yes Kathie Dike, MD  lisinopril (PRINIVIL,ZESTRIL) 10 MG tablet Take 10 mg by mouth daily.   Yes Historical Provider, MD  metFORMIN (GLUCOPHAGE) 1000 MG tablet Take 1,000 mg by mouth 2 (two) times  daily with a meal.    Yes Historical Provider, MD   BP 156/84  Pulse 83  Temp(Src) 97.7 F (36.5 C) (Oral)  Resp 16  Ht 5' 3"  (1.6 m)  Wt 167 lb 2 oz (75.807 kg)  BMI 29.61 kg/m2  SpO2 99%  LMP 10/30/2013 Physical Exam  Nursing note and vitals reviewed. Constitutional: She is oriented to person, place, and time. She appears well-developed and well-nourished.  Non-toxic appearance.  HENT:  Head: Normocephalic.  Right Ear: Tympanic membrane and external ear normal.  Left Ear: Tympanic membrane and external ear normal.  A there is redness of the tip of the nose. There are ulcers on the inner aspect of the right nares. There is soreness along the right and left outer nares. There is no red streaks going up the face. The eye is not involved. There is no increased warmth involving the orbits.  Eyes: EOM and lids are normal. Pupils are equal, round, and reactive to light.  Neck: Normal range of motion. Neck supple. Carotid bruit is not present.  Cardiovascular: Normal rate, regular rhythm, normal heart sounds, intact distal pulses and normal pulses.   Pulmonary/Chest: Breath sounds normal. No respiratory distress.  Abdominal: Soft. Bowel sounds are normal. There is no tenderness. There is no guarding.  Musculoskeletal: Normal range of motion.  Lymphadenopathy:       Head (right side): No submandibular adenopathy present.       Head (left side): No submandibular adenopathy present.    She has no cervical adenopathy.  Neurological: She is alert and oriented to person, place, and time. She has normal strength. No cranial nerve deficit or sensory deficit.  Skin: Skin is warm and dry.  Psychiatric: She has a normal mood and affect. Her speech is normal.    ED Course  Procedures (including critical care time) Labs Review Labs Reviewed - No data to display  Imaging Review No results found.   EKG Interpretation None      MDM Patient has a cellulitis involving the nose, right more  than left. The patient was treated in the emergency department with doxycycline and Rocephin. Prescription for minocycline, Bactroban and Norco given to the patient. The patient is to return on Monday may 18 , for recheck.    Final diagnoses:  None    *I have reviewed nursing notes, vital signs, and all appropriate lab and imaging results for this patient.Lenox Ahr, PA-C 12/10/13 1226

## 2013-12-10 NOTE — ED Notes (Signed)
Complain of boil to nose

## 2013-12-10 NOTE — ED Provider Notes (Signed)
Medical screening examination/treatment/procedure(s) were performed by non-physician practitioner and as supervising physician I was immediately available for consultation/collaboration.     Veryl Speak, MD 12/10/13 [phone]

## 2013-12-10 NOTE — Discharge Instructions (Signed)
You have a cellulitis involving the nose. Please use the Bactroban cream inside the nose 2 times daily, use liberally. Please use ibuprofen 3 times daily for inflammation. Please use medicine 2 times daily with food until all taken. May use Norco for pain. This medication may cause drowsiness, please use with caution. Facial Infection You have an infection of your face. This requires special attention to help prevent serious problems. Infections in facial wounds can cause poor healing and scars. They can also spread to deeper tissues, especially around the eye. Wound and dental infections can lead to sinusitis, infection of the eye socket, and even meningitis. Permanent damage to the skin, eye, and nervous system may result if facial infections are not treated properly. With severe infections, hospital care for IV antibiotic injections may be needed if they don't respond to oral antibiotics. Antibiotics must be taken for the full course to insure the infection is eliminated. If the infection came from a bad tooth, it may have to be extracted when the infection is under control. Warm compresses may be applied to reduce skin irritation and remove drainage. You might need a tetanus shot now if:  You cannot remember when your last tetanus shot was.  You have never had a tetanus shot.  The object that caused your wound was dirty. If you need a tetanus shot, and you decide not to get one, there is a rare chance of getting tetanus. Sickness from tetanus can be serious. If you got a tetanus shot, your arm may swell, get red and warm to the touch at the shot site. This is common and not a problem. SEEK IMMEDIATE MEDICAL CARE IF:   You have increased swelling, redness, or trouble breathing.  You have a severe headache, dizziness, nausea, or vomiting.  You develop problems with your eyesight.  You have a fever. Document Released: 08/23/2004 Document Revised: 10/08/2011 Document Reviewed:  07/16/2005 [facility] Patient Information 2014 [place].

## 2013-12-14 ENCOUNTER — Encounter (HOSPITAL_COMMUNITY): Payer: Self-pay | Admitting: Emergency Medicine

## 2013-12-14 ENCOUNTER — Emergency Department (HOSPITAL_COMMUNITY)
Admission: EM | Admit: 2013-12-14 | Discharge: 2013-12-14 | Disposition: A | Discharge status: Home / Self Care | Payer: Medicaid Other | Attending: Emergency Medicine | Admitting: Emergency Medicine

## 2013-12-14 DIAGNOSIS — R112 Nausea with vomiting, unspecified: Secondary | ICD-10-CM | POA: Insufficient documentation

## 2013-12-14 DIAGNOSIS — F17200 Nicotine dependence, unspecified, uncomplicated: Secondary | ICD-10-CM | POA: Insufficient documentation

## 2013-12-14 DIAGNOSIS — I10 Essential (primary) hypertension: Secondary | ICD-10-CM | POA: Diagnosis not present

## 2013-12-14 DIAGNOSIS — E119 Type 2 diabetes mellitus without complications: Secondary | ICD-10-CM | POA: Insufficient documentation

## 2013-12-14 DIAGNOSIS — Z4800 Encounter for change or removal of nonsurgical wound dressing: Secondary | ICD-10-CM | POA: Insufficient documentation

## 2013-12-14 DIAGNOSIS — J45909 Unspecified asthma, uncomplicated: Secondary | ICD-10-CM | POA: Insufficient documentation

## 2013-12-14 HISTORY — DX: Unspecified cataract: H26.9

## 2013-12-14 NOTE — ED Notes (Addendum)
Here for recheck of cellulitis of face.    Nausea , onset yesterday  Vomited x1 yesterday

## 2013-12-14 NOTE — ED Notes (Signed)
No answer when called 

## 2014-05-15 ENCOUNTER — Emergency Department (HOSPITAL_COMMUNITY)
Admission: EM | Admit: 2014-05-15 | Discharge: 2014-05-15 | Disposition: A | Discharge status: Home / Self Care | Payer: Medicaid Other | Attending: Emergency Medicine | Admitting: Emergency Medicine

## 2014-05-15 ENCOUNTER — Emergency Department (HOSPITAL_COMMUNITY): Payer: Medicaid Other

## 2014-05-15 ENCOUNTER — Encounter (HOSPITAL_COMMUNITY): Payer: Self-pay | Admitting: Emergency Medicine

## 2014-05-15 DIAGNOSIS — Z792 Long term (current) use of antibiotics: Secondary | ICD-10-CM | POA: Diagnosis not present

## 2014-05-15 DIAGNOSIS — J45901 Unspecified asthma with (acute) exacerbation: Secondary | ICD-10-CM | POA: Insufficient documentation

## 2014-05-15 DIAGNOSIS — Z9851 Tubal ligation status: Secondary | ICD-10-CM | POA: Diagnosis not present

## 2014-05-15 DIAGNOSIS — Z9049 Acquired absence of other specified parts of digestive tract: Secondary | ICD-10-CM | POA: Diagnosis not present

## 2014-05-15 DIAGNOSIS — H269 Unspecified cataract: Secondary | ICD-10-CM | POA: Diagnosis not present

## 2014-05-15 DIAGNOSIS — Z8669 Personal history of other diseases of the nervous system and sense organs: Secondary | ICD-10-CM | POA: Insufficient documentation

## 2014-05-15 DIAGNOSIS — J40 Bronchitis, not specified as acute or chronic: Secondary | ICD-10-CM

## 2014-05-15 DIAGNOSIS — Z720 Tobacco use: Secondary | ICD-10-CM | POA: Diagnosis not present

## 2014-05-15 DIAGNOSIS — I10 Essential (primary) hypertension: Secondary | ICD-10-CM | POA: Diagnosis not present

## 2014-05-15 DIAGNOSIS — Z8739 Personal history of other diseases of the musculoskeletal system and connective tissue: Secondary | ICD-10-CM | POA: Diagnosis not present

## 2014-05-15 DIAGNOSIS — E119 Type 2 diabetes mellitus without complications: Secondary | ICD-10-CM | POA: Insufficient documentation

## 2014-05-15 DIAGNOSIS — N390 Urinary tract infection, site not specified: Secondary | ICD-10-CM | POA: Diagnosis not present

## 2014-05-15 DIAGNOSIS — Z79899 Other long term (current) drug therapy: Secondary | ICD-10-CM | POA: Diagnosis not present

## 2014-05-15 DIAGNOSIS — Z9889 Other specified postprocedural states: Secondary | ICD-10-CM | POA: Insufficient documentation

## 2014-05-15 DIAGNOSIS — Z794 Long term (current) use of insulin: Secondary | ICD-10-CM | POA: Diagnosis not present

## 2014-05-15 DIAGNOSIS — R739 Hyperglycemia, unspecified: Secondary | ICD-10-CM

## 2014-05-15 DIAGNOSIS — R079 Chest pain, unspecified: Secondary | ICD-10-CM | POA: Diagnosis not present

## 2014-05-15 DIAGNOSIS — R05 Cough: Secondary | ICD-10-CM | POA: Diagnosis present

## 2014-05-15 LAB — CBG MONITORING, ED
GLUCOSE-CAPILLARY: 324 mg/dL — AB (ref 70–99)
Glucose-Capillary: 466 mg/dL — ABNORMAL HIGH (ref 70–99)

## 2014-05-15 LAB — BASIC METABOLIC PANEL
Anion gap: 9 (ref 5–15)
BUN: 26 mg/dL — ABNORMAL HIGH (ref 6–23)
CALCIUM: 9.2 mg/dL (ref 8.4–10.5)
CO2: 24 meq/L (ref 19–32)
Chloride: 97 mEq/L (ref 96–112)
Creatinine, Ser: 1.74 mg/dL — ABNORMAL HIGH (ref 0.50–1.10)
GFR, EST AFRICAN AMERICAN: 40 mL/min — AB (ref >90)
GFR, EST NON AFRICAN AMERICAN: 35 mL/min — AB (ref >90)
Glucose, Bld: 427 mg/dL — ABNORMAL HIGH (ref 70–99)
Potassium: 5 mEq/L (ref 3.7–5.3)
SODIUM: 130 meq/L — AB (ref 137–147)

## 2014-05-15 LAB — URINALYSIS, ROUTINE W REFLEX MICROSCOPIC
Bilirubin Urine: NEGATIVE
Ketones, ur: NEGATIVE mg/dL
Leukocytes, UA: NEGATIVE
Nitrite: POSITIVE — AB
Specific Gravity, Urine: 1.015 (ref 1.005–1.030)
Urobilinogen, UA: 0.2 mg/dL (ref 0.0–1.0)
pH: 6 (ref 5.0–8.0)

## 2014-05-15 LAB — URINE MICROSCOPIC-ADD ON

## 2014-05-15 MED ORDER — CEPHALEXIN 500 MG PO CAPS: 500.0000 mg | ORAL_CAPSULE | Freq: Four times a day (QID) | ORAL | Status: DC

## 2014-05-15 MED ORDER — PHENYLEPH-PROMETHAZINE-COD 5-6.25-10 MG/5ML PO SYRP: ORAL_SOLUTION | ORAL | Status: DC

## 2014-05-15 MED ORDER — ALBUTEROL SULFATE HFA 108 (90 BASE) MCG/ACT IN AERS: 2.0000 | INHALATION_SPRAY | RESPIRATORY_TRACT | Status: DC | PRN

## 2014-05-15 MED ORDER — SODIUM CHLORIDE 0.9 % IV SOLN: 1000.0000 mL | Freq: Once | INTRAVENOUS | Status: DC

## 2014-05-15 MED ORDER — ALBUTEROL SULFATE (2.5 MG/3ML) 0.083% IN NEBU
2.5000 mg | INHALATION_SOLUTION | Freq: Once | RESPIRATORY_TRACT | Status: AC
  Administered 2014-05-15: 2.5 mg via RESPIRATORY_TRACT
  Filled 2014-05-15: qty 3

## 2014-05-15 MED ORDER — IPRATROPIUM BROMIDE 0.02 % IN SOLN
0.5000 mg | Freq: Once | RESPIRATORY_TRACT | Status: AC
  Administered 2014-05-15: 0.5 mg via RESPIRATORY_TRACT
  Filled 2014-05-15: qty 2.5

## 2014-05-15 MED ORDER — INSULIN ASPART 100 UNIT/ML ~~LOC~~ SOLN
10.0000 [IU] | Freq: Once | SUBCUTANEOUS | Status: AC
  Administered 2014-05-15: 10 [IU] via SUBCUTANEOUS
  Filled 2014-05-15: qty 1

## 2014-05-15 MED ORDER — SODIUM CHLORIDE 0.9 % IV SOLN: 1000.0000 mL | INTRAVENOUS | Status: DC

## 2014-05-15 MED ORDER — CEPHALEXIN 500 MG PO CAPS
500.0000 mg | ORAL_CAPSULE | Freq: Once | ORAL | Status: AC
  Administered 2014-05-15: 500 mg via ORAL
  Filled 2014-05-15: qty 1

## 2014-05-15 MED ORDER — HYDROCOD POLST-CHLORPHEN POLST 10-8 MG/5ML PO LQCR
5.0000 mL | Freq: Once | ORAL | Status: AC
  Administered 2014-05-15: 5 mL via ORAL
  Filled 2014-05-15: qty 5

## 2014-05-15 NOTE — ED Provider Notes (Signed)
CSN: [id number]     Arrival date & time 05/15/14  1139 History   First MD Initiated Contact with Patient 05/15/14 1153     Chief Complaint  Patient presents with  . Nasal Congestion  . Cough     (Consider location/radiation/quality/duration/timing/severity/associated sxs/prior Treatment) Patient is a 43 y.o. female presenting with cough. The history is provided by the patient.  Cough Cough characteristics:  Productive Sputum characteristics:  Nondescript Severity:  Moderate Onset quality:  Gradual Timing:  Intermittent Progression:  Worsening Chronicity:  Chronic Smoker: yes   Context: sick contacts   Relieved by:  Nothing Worsened by:  Nothing tried Ineffective treatments:  None tried Associated symptoms: chest pain, rhinorrhea, sinus congestion and wheezing   Associated symptoms: no eye discharge, no fever and no shortness of breath   Risk factors: no chemical exposure and no recent travel     Past Medical History  Diagnosis Date  . Diabetes mellitus   . Neuropathy   . Asthma   . Hypertension   . Osteomyelitis     left middle toe   . Cataract    Past Surgical History  Procedure Laterality Date  . Cesarean section    . Tubal ligation    . Cholecystectomy N/A 05/25/2013    Procedure: LAPAROSCOPIC CHOLECYSTECTOMY;  Surgeon: Jamesetta So, MD;  Location: [facility];  Service: General;  Laterality: N/A;  . Eye surgery     Family History  Problem Relation Age of Onset  . COPD Mother   . Cancer Father   . Diabetes Sister   . Diabetes Brother    History  Substance Use Topics  . Smoking status: Current Every Day Smoker -- 0.50 packs/day for 20 years    Types: Cigarettes  . Smokeless tobacco: Not on file  . Alcohol Use: No   OB History   Grav Para Term Preterm Abortions TAB SAB Ect Mult Living   5 5             Review of Systems  Constitutional: Negative for fever and activity change.       All ROS Neg except as noted in HPI  HENT: Positive for rhinorrhea.    Eyes: Negative for photophobia and discharge.  Respiratory: Positive for cough and wheezing. Negative for shortness of breath.   Cardiovascular: Positive for chest pain. Negative for palpitations.  Gastrointestinal: Negative for abdominal pain and blood in stool.  Genitourinary: Negative for dysuria, frequency and hematuria.  Musculoskeletal: Negative for arthralgias, back pain and neck pain.  Skin: Negative.   Neurological: Negative for dizziness, seizures and speech difficulty.  Psychiatric/Behavioral: Negative for hallucinations and confusion.      Allergies  Bactrim and Prednisone  Home Medications   Prior to Admission medications   Medication Sig Start Date End Date Taking? Authorizing Provider  acetaminophen (TYLENOL) 500 MG tablet Take 1,000 mg by mouth every 6 (six) hours as needed. For pain    Historical Provider, MD  budesonide-formoterol (SYMBICORT) 160-4.5 MCG/ACT inhaler Inhale 2 puffs into the lungs 2 (two) times daily as needed (shortness of breath).    Historical Provider, MD  gabapentin (NEURONTIN) 300 MG capsule Take 600 mg by mouth 3 (three) times daily.     Historical Provider, MD  hydrALAZINE (APRESOLINE) 25 MG tablet Take 25 mg by mouth 2 (two) times daily.     Historical Provider, MD  hydrochlorothiazide (HYDRODIURIL) 25 MG tablet Take 25 mg by mouth daily.    Historical Provider, MD  HYDROcodone-acetaminophen (NORCO/VICODIN)  5-325 MG per tablet Take 1 tablet by mouth every 4 (four) hours as needed for moderate pain. 12/10/13   Lenox Ahr, PA-C  insulin glargine (LANTUS) 100 UNIT/ML injection Inject 30 Units into the skin 2 (two) times daily.  05/26/13   Kathie Dike, MD  lisinopril (PRINIVIL,ZESTRIL) 10 MG tablet Take 10 mg by mouth daily.    Historical Provider, MD  metFORMIN (GLUCOPHAGE) 1000 MG tablet Take 1,000 mg by mouth 2 (two) times daily with a meal.     Historical Provider, MD  minocycline (MINOCIN) 100 MG capsule Take 1 capsule (100 mg total) by  mouth 2 (two) times daily. 12/10/13   Lenox Ahr, PA-C  mupirocin cream (BACTROBAN) 2 % Apply inside each nostril 2 times daily for the next 7 days 12/10/13   Lenox Ahr, PA-C   BP 156/86  Pulse 86  Temp(Src) 98.1 F (36.7 C) (Oral)  Resp 18  Ht 5' 3"  (1.6 m)  Wt 183 lb (83.008 kg)  BMI 32.43 kg/m2  SpO2 96% Physical Exam  ED Course  Procedures (including critical care time) Labs Review Labs Reviewed  CBG MONITORING, ED - Abnormal; Notable for the following:    Glucose-Capillary 466 (*)    All other components within normal limits  URINALYSIS, ROUTINE W REFLEX MICROSCOPIC    Imaging Review No results found.   EKG Interpretation None      MDM  Initial CBG is elevated at 466. Urinalysis reveals too many to count white cells, many bacteria, and positive nitrates. There is greater than 1000 mg per decaliter of glucose, and is greater than 300 mg per decaliter of protein. The metabolic panel shows the sodium to be slightly low at 130, potassium is normal at 5, the BUN is elevated at 26 and a creatinine is elevated at 1.74. The glucose is elevated at 427. Culture of the urine sent to the lab. Patient treated with albuterol and Tussionex for cough.  Patient was given 10 units of regular insulin subcutaneous, and one hour later the glucose was down to 324. The patient is eating and drinking in the emergency department without any problem. Chest x-ray is negative for acute problem. Prescription for promethazine codeine cough medication given to the patient. Prescription for albuterol inhaler also given to the patient. Patient is to return to the emergency apartment if any changes or problems.    Final diagnoses:  None    *I have reviewed nursing notes, vital signs, and all appropriate lab and imaging results for this patient.Lenox Ahr, PA-C 05/15/14 [phone]

## 2014-05-15 NOTE — ED Notes (Signed)
RT aware of orders

## 2014-05-15 NOTE — Discharge Instructions (Signed)
High Blood Sugar High blood sugar (hyperglycemia) means that the level of sugar in your blood is higher than it should be. Signs of high blood sugar include:  Feeling thirsty.  Frequent peeing (urinating).  Feeling tired or sleepy.  Dry mouth.  Vision changes.  Feeling weak.  Feeling hungry but losing weight.  Numbness and tingling in your hands or feet.  Headache. When you ignore these signs, your blood sugar may keep going up. These problems may get worse, and other problems may begin. HOME CARE  Check your blood sugars as told by your doctor. Write down the numbers with the date and time.  Take the right amount of insulin or diabetes pills at the right time. Write down the dose with date and time.  Refill your insulin or diabetes pills before running out.  Watch what you eat. Follow your meal plan.  Drink liquids without sugar, such as water. Check with your doctor if you have kidney or heart disease.  Follow your doctor's orders for exercise. Exercise at the same time of day.  Keep your doctor's appointments. GET HELP RIGHT AWAY IF:   You have trouble thinking or are confused.  You have fast breathing with fruity smelling breath.  You pass out (faint).  You have 2 to 3 days of high blood sugars and you do not know why.  You have chest pain.  You are feeling sick to your stomach (nauseous) or throwing up (vomiting).  You have sudden vision changes. MAKE SURE YOU:   Understand these instructions.  Will watch your condition.  Will get help right away if you are not doing well or get worse. Document Released: 05/13/2009 Document Revised: 10/08/2011 Document Reviewed: 05/13/2009 [facility] Patient Information 2015 [place], [place]. This information is not intended to replace advice given to you by your health care provider. Make sure you discuss any questions you have with your health care provider.

## 2014-05-15 NOTE — ED Notes (Addendum)
Pt reports cough and congestion x3 weeks. Pt also co pain between shoulder blades that comes and goes. Pt is a diabetic, has not taken insulin in 2 weeks, has not checked glucose in same amt of time. Pt also co foul smelling urine.

## 2014-05-16 NOTE — ED Provider Notes (Signed)
  Medical screening examination/treatment/procedure(s) were performed by non-physician practitioner and as supervising physician I was immediately available for consultation/collaboration.   EKG Interpretation None         Carmin Muskrat, MD 05/16/14 [phone]

## 2014-05-18 LAB — URINE CULTURE: Colony Count: >=100000

## 2014-05-19 ENCOUNTER — Telehealth (HOSPITAL_COMMUNITY): Payer: Self-pay

## 2014-05-19 NOTE — Progress Notes (Signed)
ED Antimicrobial Stewardship Positive Culture Follow Up   Kara Knapp is an 43 y.o. female who presented to [facility] on 05/15/2014 with a chief complaint of  Chief Complaint  Patient presents with  . Nasal Congestion  . Cough    Recent Results (from the past 720 hour(s))  URINE CULTURE     Status: None   Collection Time    05/15/14 12:32 PM      Result Value Ref Range Status   Specimen Description URINE, CLEAN CATCH   Final   Special Requests NONE   Final   Culture  Setup Time     Final   Value: 05/15/2014 22:04     Performed at [facility]     Final   Value: >=100,000 COLONIES/ML     Performed at [place]     Final   Value: KLEBSIELLA [place]     Performed at [facility]   Report Status 05/18/2014 FINAL   Final   Organism ID, Bacteria KLEBSIELLA PNEUMONIAE   Final   Organism ID, Bacteria ESCHERICHIA COLI   Final    [x]  Treated with Cephalexin, organism resistant to prescribed antimicrobial []  Patient discharged originally without antimicrobial agent and treatment is now indicated - CrCl ~42 ml/min  New antibiotic prescription: Ciprofloxacin 598m PO BID x 3 days  ED Provider: LHarvie Heck PA-C   DEarleen Newport10/21/2015, 10:24 AM Infectious Diseases Pharmacist Phone# [phone]

## 2014-05-20 ENCOUNTER — Telehealth (HOSPITAL_COMMUNITY): Payer: Self-pay

## 2014-05-25 ENCOUNTER — Telehealth (HOSPITAL_COMMUNITY): Payer: Self-pay

## 2014-05-31 ENCOUNTER — Encounter (HOSPITAL_COMMUNITY): Payer: Self-pay | Admitting: Emergency Medicine

## 2014-09-03 ENCOUNTER — Encounter (HOSPITAL_COMMUNITY): Payer: Self-pay | Admitting: Emergency Medicine

## 2014-09-03 ENCOUNTER — Emergency Department (HOSPITAL_COMMUNITY)
Admission: EM | Admit: 2014-09-03 | Discharge: 2014-09-03 | Discharge status: Against Medical Advice | Payer: Medicaid Other | Attending: Emergency Medicine | Admitting: Emergency Medicine

## 2014-09-03 DIAGNOSIS — R109 Unspecified abdominal pain: Secondary | ICD-10-CM | POA: Diagnosis not present

## 2014-09-03 DIAGNOSIS — I10 Essential (primary) hypertension: Secondary | ICD-10-CM | POA: Insufficient documentation

## 2014-09-03 DIAGNOSIS — M549 Dorsalgia, unspecified: Secondary | ICD-10-CM | POA: Diagnosis not present

## 2014-09-03 DIAGNOSIS — E119 Type 2 diabetes mellitus without complications: Secondary | ICD-10-CM | POA: Insufficient documentation

## 2014-09-03 DIAGNOSIS — J45909 Unspecified asthma, uncomplicated: Secondary | ICD-10-CM | POA: Diagnosis not present

## 2014-09-03 DIAGNOSIS — Z720 Tobacco use: Secondary | ICD-10-CM | POA: Diagnosis not present

## 2014-09-03 LAB — CBG MONITORING, ED: GLUCOSE-CAPILLARY: 214 mg/dL — AB (ref 70–99)

## 2014-09-03 NOTE — ED Notes (Signed)
Patient complaining of back and abdominal pain off and on "for a couple of months but it's worse the last couple of days." Denies vomiting or diarrhea.

## 2014-09-05 ENCOUNTER — Emergency Department (HOSPITAL_COMMUNITY)
Admission: EM | Admit: 2014-09-05 | Discharge: 2014-09-05 | Disposition: A | Discharge status: Home / Self Care | Payer: Medicaid Other | Attending: Emergency Medicine | Admitting: Emergency Medicine

## 2014-09-05 ENCOUNTER — Emergency Department (HOSPITAL_COMMUNITY): Payer: Medicaid Other

## 2014-09-05 ENCOUNTER — Encounter (HOSPITAL_COMMUNITY): Payer: Self-pay | Admitting: Emergency Medicine

## 2014-09-05 DIAGNOSIS — Z720 Tobacco use: Secondary | ICD-10-CM | POA: Diagnosis not present

## 2014-09-05 DIAGNOSIS — E119 Type 2 diabetes mellitus without complications: Secondary | ICD-10-CM | POA: Diagnosis not present

## 2014-09-05 DIAGNOSIS — Z79899 Other long term (current) drug therapy: Secondary | ICD-10-CM | POA: Diagnosis not present

## 2014-09-05 DIAGNOSIS — Z792 Long term (current) use of antibiotics: Secondary | ICD-10-CM | POA: Insufficient documentation

## 2014-09-05 DIAGNOSIS — J45909 Unspecified asthma, uncomplicated: Secondary | ICD-10-CM | POA: Diagnosis not present

## 2014-09-05 DIAGNOSIS — J209 Acute bronchitis, unspecified: Secondary | ICD-10-CM | POA: Insufficient documentation

## 2014-09-05 DIAGNOSIS — I10 Essential (primary) hypertension: Secondary | ICD-10-CM | POA: Insufficient documentation

## 2014-09-05 DIAGNOSIS — Z8669 Personal history of other diseases of the nervous system and sense organs: Secondary | ICD-10-CM | POA: Diagnosis not present

## 2014-09-05 DIAGNOSIS — Z794 Long term (current) use of insulin: Secondary | ICD-10-CM | POA: Insufficient documentation

## 2014-09-05 DIAGNOSIS — R05 Cough: Secondary | ICD-10-CM | POA: Diagnosis present

## 2014-09-05 DIAGNOSIS — M549 Dorsalgia, unspecified: Secondary | ICD-10-CM | POA: Insufficient documentation

## 2014-09-05 MED ORDER — GUAIFENESIN-CODEINE 100-10 MG/5ML PO SOLN: 10.0000 mL | Freq: Four times a day (QID) | ORAL | Status: DC | PRN

## 2014-09-05 MED ORDER — AZITHROMYCIN 250 MG PO TABS: ORAL_TABLET | ORAL | Status: DC

## 2014-09-05 NOTE — ED Notes (Signed)
Patient c/o intermittent cough x1 month with cough progressively getting worse in past 3 days. Per patient now cough is productive with thick sputum. Patient unsure of color-per patient poor eyesight. Patient unsure of fevers but states she felt she had one last night. Denies any vomiting or diarrhea. Reports nausea with forceful coughing.

## 2014-09-05 NOTE — Discharge Instructions (Signed)
Zithromax as prescribed.  Robitussin with codeine as prescribed as needed for cough.  Return to the emergency department if you develop chest pain, difficulty breathing, or other new and concerning symptoms.   Acute Bronchitis Bronchitis is inflammation of the airways that extend from the windpipe into the lungs (bronchi). The inflammation often causes mucus to develop. This leads to a cough, which is the most common symptom of bronchitis.  In acute bronchitis, the condition usually develops suddenly and goes away over time, usually in a couple weeks. Smoking, allergies, and asthma can make bronchitis worse. Repeated episodes of bronchitis may cause further lung problems.  CAUSES Acute bronchitis is most often caused by the same virus that causes a cold. The virus can spread from person to person (contagious) through coughing, sneezing, and touching contaminated objects. SIGNS AND SYMPTOMS   Cough.   Fever.   Coughing up mucus.   Body aches.   Chest congestion.   Chills.   Shortness of breath.   Sore throat.  DIAGNOSIS  Acute bronchitis is usually diagnosed through a physical exam. Your health care provider will also ask you questions about your medical history. Tests, such as chest X-rays, are sometimes done to rule out other conditions.  TREATMENT  Acute bronchitis usually goes away in a couple weeks. Oftentimes, no medical treatment is necessary. Medicines are sometimes given for relief of fever or cough. Antibiotic medicines are usually not needed but may be prescribed in certain situations. In some cases, an inhaler may be recommended to help reduce shortness of breath and control the cough. A cool mist vaporizer may also be used to help thin bronchial secretions and make it easier to clear the chest.  HOME CARE INSTRUCTIONS  Get plenty of rest.   Drink enough fluids to keep your urine clear or pale yellow (unless you have a medical condition that requires fluid  restriction). Increasing fluids may help thin your respiratory secretions (sputum) and reduce chest congestion, and it will prevent dehydration.   Take medicines only as directed by your health care provider.  If you were prescribed an antibiotic medicine, finish it all even if you start to feel better.  Avoid smoking and secondhand smoke. Exposure to cigarette smoke or irritating chemicals will make bronchitis worse. If you are a smoker, consider using nicotine gum or skin patches to help control withdrawal symptoms. Quitting smoking will help your lungs heal faster.   Reduce the chances of another bout of acute bronchitis by washing your hands frequently, avoiding people with cold symptoms, and trying not to touch your hands to your mouth, nose, or eyes.   Keep all follow-up visits as directed by your health care provider.  SEEK MEDICAL CARE IF: Your symptoms do not improve after 1 week of treatment.  SEEK IMMEDIATE MEDICAL CARE IF:  You develop an increased fever or chills.   You have chest pain.   You have severe shortness of breath.  You have bloody sputum.   You develop dehydration.  You faint or repeatedly feel like you are going to pass out.  You develop repeated vomiting.  You develop a severe headache. MAKE SURE YOU:   Understand these instructions.  Will watch your condition.  Will get help right away if you are not doing well or get worse. Document Released: 08/23/2004 Document Revised: 11/30/2013 Document Reviewed: 01/06/2013 [facility] Patient Information 2015 [place], [place]. This information is not intended to replace advice given to you by your health care provider. Make sure you  discuss any questions you have with your health care provider.

## 2014-09-05 NOTE — ED Notes (Signed)
Patient with no complaints at this time. Respirations even and unlabored. Skin warm/dry. Discharge instructions reviewed with patient at this time. Patient given opportunity to voice concerns/ask questions. Patient discharged at this time and left Emergency Department with steady gait.   

## 2014-09-05 NOTE — ED Notes (Signed)
Cough x more than 1 month.  Was seen here 3-4 months ago and given rx for cough med but could not afford it.  She has not been on any antibx in last 30 days.  Reports chills and diaphoresis at times.  Intermittent sore throat, but none today.  Lung fields diminished in bases. Oropharnyx w/out erythema, swelling or exudate. No rashes

## 2014-09-05 NOTE — ED Provider Notes (Signed)
CSN: [id number]     Arrival date & time 09/05/14  1057 History   First MD Initiated Contact with Patient 09/05/14 1138     This chart was scribed for Kara Speak, MD by Forrestine Him, ED Scribe. This patient was seen in room [facility] and the patient's care was started 11:41 AM.   Chief Complaint  Patient presents with  . Cough   Patient is a 44 y.o. female presenting with cough. The history is provided by the patient. No language interpreter was used.  Cough Severity:  Moderate Duration:  30 days Timing:  Constant Progression:  Unchanged Chronicity:  New Smoker: yes   Relieved by:  None tried Worsened by:  Nothing tried Ineffective treatments:  None tried Associated symptoms: diaphoresis and sore throat     HPI Comments: Kara Knapp is a 44 y.o. female with a PMHx of DM and HTN who presents to the Emergency Department complaining of constant, moderate cough x 1 month that is unchagned. Pt was seen 3-4 months ago for cough and was sent home with a cough medication but was unable to afford the prescription at the time. She also reports back pain and pain to her ribs onset 3-4 days. [name] mentions intermittent diaphoresis, sore throat and fever but not at this time. She denies any chills. Pt is an every day smoker and smokes about 1 pack of cigarettes a day. No recent antibiotic use. Pt with known allergies to Bactrim and Prednisone.  Past Medical History  Diagnosis Date  . Diabetes mellitus   . Neuropathy   . Asthma   . Hypertension   . Osteomyelitis     left middle toe   . Cataract    Past Surgical History  Procedure Laterality Date  . Cesarean section    . Tubal ligation    . Cholecystectomy N/A 05/25/2013    Procedure: LAPAROSCOPIC CHOLECYSTECTOMY;  Surgeon: Jamesetta So, MD;  Location: [facility];  Service: General;  Laterality: N/A;  . Eye surgery     Family History  Problem Relation Age of Onset  . COPD Mother   . Cancer Father   . Diabetes Sister   .  Diabetes Brother    History  Substance Use Topics  . Smoking status: Current Every Day Smoker -- 0.50 packs/day for 20 years    Types: Cigarettes  . Smokeless tobacco: Never Used  . Alcohol Use: No   OB History    Gravida Para Term Preterm AB TAB SAB Ectopic Multiple Living   5 5             Review of Systems  Constitutional: Positive for diaphoresis.  HENT: Positive for sore throat.   Respiratory: Positive for cough.   Musculoskeletal: Positive for back pain and arthralgias.  All other systems reviewed and are negative.     Allergies  Bactrim and Prednisone  Home Medications   Prior to Admission medications   Medication Sig Start Date End Date Taking? Authorizing Provider  acetaminophen (TYLENOL) 500 MG tablet Take 1,000 mg by mouth every 6 (six) hours as needed. For pain   Yes Historical Provider, MD  albuterol (PROVENTIL HFA;VENTOLIN HFA) 108 (90 BASE) MCG/ACT inhaler Inhale 2 puffs into the lungs every 4 (four) hours as needed for wheezing or shortness of breath. 05/15/14  Yes Lenox Ahr, PA-C  atropine 1 % ophthalmic solution Place 1 drop into the right eye daily.   Yes Historical Provider, MD  brimonidine-timolol (COMBIGAN) 0.2-0.5 %  ophthalmic solution Place 1 drop into the left eye every 12 (twelve) hours.   Yes Historical Provider, MD  budesonide-formoterol (SYMBICORT) 160-4.5 MCG/ACT inhaler Inhale 2 puffs into the lungs 2 (two) times daily as needed (shortness of breath).   Yes Historical Provider, MD  gabapentin (NEURONTIN) 300 MG capsule Take 600 mg by mouth 3 (three) times daily.    Yes Historical Provider, MD  hydrALAZINE (APRESOLINE) 25 MG tablet Take 25 mg by mouth 3 (three) times daily.    Yes Historical Provider, MD  hydrochlorothiazide (HYDRODIURIL) 25 MG tablet Take 25 mg by mouth daily.   Yes Historical Provider, MD  ibuprofen (ADVIL,MOTRIN) 600 MG tablet Take 1 tablet by mouth 4 (four) times daily as needed. 08/30/14  Yes Historical Provider, MD   insulin glargine (LANTUS) 100 UNIT/ML injection Inject 45 Units into the skin 2 (two) times daily.  05/26/13  Yes Kathie Dike, MD  lisinopril (PRINIVIL,ZESTRIL) 10 MG tablet Take 10 mg by mouth daily.   Yes Historical Provider, MD  metFORMIN (GLUCOPHAGE) 500 MG tablet Take 500 mg by mouth 2 (two) times daily with a meal.    Yes Historical Provider, MD  ofloxacin (OCUFLOX) 0.3 % ophthalmic solution Place 1 drop into the right eye 4 (four) times daily.   Yes Historical Provider, MD  Phenyleph-Promethazine-Cod 5-6.25-10 MG/5ML SYRP 70m po q6h prn cough/congestion 05/15/14  Yes HLenox Ahr PA-C  prednisoLONE acetate (PRED FORTE) 1 % ophthalmic suspension Place 1 drop into the right eye 4 (four) times daily.   Yes Historical Provider, MD  cephALEXin (KEFLEX) 500 MG capsule Take 1 capsule (500 mg total) by mouth 4 (four) times daily. Patient not taking: Reported on 09/05/2014 05/15/14   HLenox Ahr PA-C   Triage Vitals: BP 127/64 mmHg  Pulse 80  Temp(Src) 98.2 F (36.8 C) (Oral)  Resp 18  Ht 5' 3"  (1.6 m)  Wt 180 lb (81.647 kg)  BMI 31.89 kg/m2  SpO2 98%   Physical Exam  Constitutional: She is oriented to person, place, and time. She appears well-developed and well-nourished.  HENT:  Head: Normocephalic.  Eyes: EOM are normal.  Neck: Normal range of motion.  Cardiovascular: Normal rate, regular rhythm and normal heart sounds.   Pulmonary/Chest: Effort normal and breath sounds normal.  Abdominal: She exhibits no distension.  Musculoskeletal: Normal range of motion.  Neurological: She is alert and oriented to person, place, and time.  Psychiatric: She has a normal mood and affect.  Nursing note and vitals reviewed.   ED Course  Procedures (including critical care time)  DIAGNOSTIC STUDIES: Oxygen Saturation is 98% on RA, Normal by my interpretation.    COORDINATION OF CARE: 11:39 AM- Will order CXR. Discussed treatment plan with pt at bedside and pt agreed to plan.      Labs Review Labs Reviewed - No data to display  Imaging Review No results found.   EKG Interpretation None      MDM    Chest x-ray is negative, however due to the prolonged nature of her symptoms and lack of improvement with over-the-counter medications, she will be treated with antibiotics and prescription cough medication. If her symptoms are to worsen, she is to return to the ER for further evaluation. She is also advised to stop smoking.   I personally performed the services described in this documentation, which was scribed in my presence. The recorded information has been reviewed and is accurate.      DVeryl Speak MD 09/05/14 1302

## 2014-11-19 ENCOUNTER — Other Ambulatory Visit: Payer: Self-pay | Admitting: Ophthalmology

## 2014-11-22 ENCOUNTER — Encounter (HOSPITAL_COMMUNITY): Payer: Self-pay | Admitting: *Deleted

## 2014-11-22 NOTE — Progress Notes (Signed)
Pt denies SOB, chest pain, and being under the care of a cardiologist. Pt denies having a stress test, echo and cardiac cath. Pt stated that an EKG was done at [facility] within the last year; records requested. Pt made aware to not take any diabetic medications on the DOS. Pt made aware to stop taking otc vitamins and herbal medications. Pt verbalized understanding of all pre-op instructions.

## 2014-11-24 ENCOUNTER — Encounter (HOSPITAL_COMMUNITY): Payer: Self-pay | Admitting: Ophthalmology

## 2014-11-24 ENCOUNTER — Ambulatory Visit (HOSPITAL_COMMUNITY): Payer: Medicaid Other | Admitting: Certified Registered Nurse Anesthetist

## 2014-11-24 ENCOUNTER — Encounter (HOSPITAL_COMMUNITY)
Admission: RE | Disposition: A | Discharge status: Home / Self Care | Payer: Self-pay | Source: Ambulatory Visit | Attending: Ophthalmology

## 2014-11-24 ENCOUNTER — Ambulatory Visit (HOSPITAL_COMMUNITY)
Admission: RE | Admit: 2014-11-24 | Discharge: 2014-11-24 | Disposition: A | Discharge status: Home / Self Care | Payer: Medicaid Other | Source: Ambulatory Visit | Attending: Ophthalmology | Admitting: Ophthalmology

## 2014-11-24 ENCOUNTER — Other Ambulatory Visit: Payer: Medicaid Other

## 2014-11-24 ENCOUNTER — Ambulatory Visit (HOSPITAL_COMMUNITY): Payer: Self-pay | Admitting: Ophthalmology

## 2014-11-24 DIAGNOSIS — E11359 Type 2 diabetes mellitus with proliferative diabetic retinopathy without macular edema: Secondary | ICD-10-CM | POA: Insufficient documentation

## 2014-11-24 DIAGNOSIS — Z881 Allergy status to other antibiotic agents status: Secondary | ICD-10-CM | POA: Insufficient documentation

## 2014-11-24 DIAGNOSIS — Z9851 Tubal ligation status: Secondary | ICD-10-CM | POA: Diagnosis not present

## 2014-11-24 DIAGNOSIS — F329 Major depressive disorder, single episode, unspecified: Secondary | ICD-10-CM | POA: Diagnosis not present

## 2014-11-24 DIAGNOSIS — H4312 Vitreous hemorrhage, left eye: Secondary | ICD-10-CM | POA: Insufficient documentation

## 2014-11-24 DIAGNOSIS — Z888 Allergy status to other drugs, medicaments and biological substances status: Secondary | ICD-10-CM | POA: Insufficient documentation

## 2014-11-24 DIAGNOSIS — D649 Anemia, unspecified: Secondary | ICD-10-CM | POA: Insufficient documentation

## 2014-11-24 DIAGNOSIS — F17210 Nicotine dependence, cigarettes, uncomplicated: Secondary | ICD-10-CM | POA: Insufficient documentation

## 2014-11-24 DIAGNOSIS — Z794 Long term (current) use of insulin: Secondary | ICD-10-CM | POA: Diagnosis not present

## 2014-11-24 DIAGNOSIS — I10 Essential (primary) hypertension: Secondary | ICD-10-CM | POA: Diagnosis not present

## 2014-11-24 DIAGNOSIS — Z9049 Acquired absence of other specified parts of digestive tract: Secondary | ICD-10-CM | POA: Diagnosis not present

## 2014-11-24 DIAGNOSIS — J45909 Unspecified asthma, uncomplicated: Secondary | ICD-10-CM | POA: Insufficient documentation

## 2014-11-24 DIAGNOSIS — Z6831 Body mass index (BMI) 31.0-31.9, adult: Secondary | ICD-10-CM | POA: Insufficient documentation

## 2014-11-24 HISTORY — PX: PARS PLANA VITRECTOMY: SHX2166

## 2014-11-24 HISTORY — DX: Bronchitis, not specified as acute or chronic: J40

## 2014-11-24 HISTORY — DX: Depression, unspecified: F32.A

## 2014-11-24 HISTORY — PX: PHOTOCOAGULATION WITH LASER: SHX6027

## 2014-11-24 HISTORY — DX: Major depressive disorder, single episode, unspecified: F32.9

## 2014-11-24 HISTORY — DX: Hyperlipidemia, unspecified: E78.5

## 2014-11-24 LAB — CBC
HEMATOCRIT: 26.5 % — AB (ref 36.0–46.0)
Hemoglobin: 8.9 g/dL — ABNORMAL LOW (ref 12.0–15.0)
MCH: 28.8 pg (ref 26.0–34.0)
MCHC: 33.6 g/dL (ref 30.0–36.0)
MCV: 85.8 fL (ref 78.0–100.0)
PLATELETS: 291 10*3/uL (ref 150–400)
RBC: 3.09 MIL/uL — ABNORMAL LOW (ref 3.87–5.11)
RDW: 15 % (ref 11.5–15.5)
WBC: 10.1 10*3/uL (ref 4.0–10.5)

## 2014-11-24 LAB — HCG, SERUM, QUALITATIVE: PREG SERUM: NEGATIVE

## 2014-11-24 LAB — GLUCOSE, CAPILLARY
GLUCOSE-CAPILLARY: 82 mg/dL (ref 70–99)
GLUCOSE-CAPILLARY: 72 mg/dL (ref 70–99)
Glucose-Capillary: 95 mg/dL (ref 70–99)
Glucose-Capillary: 66 mg/dL — ABNORMAL LOW (ref 70–99)
Glucose-Capillary: 70 mg/dL (ref 70–99)

## 2014-11-24 LAB — BASIC METABOLIC PANEL
Anion gap: 9 (ref 5–15)
BUN: 32 mg/dL — AB (ref 6–23)
CALCIUM: 8.4 mg/dL (ref 8.4–10.5)
CHLORIDE: 108 mmol/L (ref 96–112)
CO2: 18 mmol/L — ABNORMAL LOW (ref 19–32)
Creatinine, Ser: 2.89 mg/dL — ABNORMAL HIGH (ref 0.50–1.10)
GFR calc Af Amer: 22 mL/min — ABNORMAL LOW (ref >90)
GFR calc non Af Amer: 19 mL/min — ABNORMAL LOW (ref >90)
Glucose, Bld: 91 mg/dL (ref 70–99)
Potassium: 4.6 mmol/L (ref 3.5–5.1)
Sodium: 135 mmol/L (ref 135–145)

## 2014-11-24 SURGERY — PARS PLANA VITRECTOMY WITH 25 GAUGE
Anesthesia: Monitor Anesthesia Care | Laterality: Left

## 2014-11-24 SURGERY — PARS PLANA VITRECTOMY WITH 25 GAUGE
Anesthesia: Monitor Anesthesia Care | Site: Eye | Laterality: Left

## 2014-11-24 MED ORDER — PHENYLEPHRINE HCL 2.5 % OP SOLN
1.0000 [drp] | OPHTHALMIC | Status: AC | PRN
  Administered 2014-11-24 (×3): 1 [drp] via OPHTHALMIC
  Filled 2014-11-24: qty 2

## 2014-11-24 MED ORDER — TETRACAINE HCL 0.5 % OP SOLN
OPHTHALMIC | Status: AC
  Filled 2014-11-24: qty 2

## 2014-11-24 MED ORDER — NA CHONDROIT SULF-NA HYALURON 40-30 MG/ML IO SOLN
INTRAOCULAR | Status: AC
  Filled 2014-11-24: qty 0.5

## 2014-11-24 MED ORDER — ONDANSETRON HCL 4 MG/2ML IJ SOLN
INTRAMUSCULAR | Status: DC | PRN
  Administered 2014-11-24: 4 mg via INTRAVENOUS

## 2014-11-24 MED ORDER — DEXAMETHASONE SODIUM PHOSPHATE 10 MG/ML IJ SOLN
INTRAMUSCULAR | Status: DC | PRN
  Administered 2014-11-24: 2 mL

## 2014-11-24 MED ORDER — ONDANSETRON HCL 4 MG/2ML IJ SOLN
INTRAMUSCULAR | Status: AC
  Filled 2014-11-24: qty 2

## 2014-11-24 MED ORDER — HYPROMELLOSE (GONIOSCOPIC) 2.5 % OP SOLN
OPHTHALMIC | Status: DC | PRN
  Administered 2014-11-24: 2 [drp] via OPHTHALMIC

## 2014-11-24 MED ORDER — LIDOCAINE HCL (CARDIAC) 20 MG/ML IV SOLN
INTRAVENOUS | Status: AC
  Filled 2014-11-24: qty 10

## 2014-11-24 MED ORDER — ROCURONIUM BROMIDE 50 MG/5ML IV SOLN
INTRAVENOUS | Status: AC
  Filled 2014-11-24: qty 1

## 2014-11-24 MED ORDER — LIDOCAINE HCL (CARDIAC) 20 MG/ML IV SOLN
INTRAVENOUS | Status: DC | PRN
  Administered 2014-11-24: 50 mg via INTRAVENOUS

## 2014-11-24 MED ORDER — EPINEPHRINE HCL 1 MG/ML IJ SOLN
INTRAOCULAR | Status: DC | PRN
  Administered 2014-11-24: 16:00:00

## 2014-11-24 MED ORDER — SODIUM CHLORIDE 0.9 % IV SOLN: INTRAVENOUS | Status: DC | PRN

## 2014-11-24 MED ORDER — MIDAZOLAM HCL 5 MG/5ML IJ SOLN
INTRAMUSCULAR | Status: DC | PRN
  Administered 2014-11-24: 2 mg via INTRAVENOUS

## 2014-11-24 MED ORDER — POLYMYXIN B SULFATE 500000 UNITS IJ SOLR
INTRAMUSCULAR | Status: AC
  Filled 2014-11-24: qty 1

## 2014-11-24 MED ORDER — MIDAZOLAM HCL 2 MG/2ML IJ SOLN
INTRAMUSCULAR | Status: AC
  Filled 2014-11-24: qty 2

## 2014-11-24 MED ORDER — PROPOFOL 10 MG/ML IV BOLUS
INTRAVENOUS | Status: DC | PRN
  Administered 2014-11-24: 40 mg via INTRAVENOUS
  Administered 2014-11-24 (×3): 10 mg via INTRAVENOUS

## 2014-11-24 MED ORDER — SODIUM CHLORIDE 0.9 % IJ SOLN
INTRAMUSCULAR | Status: AC
  Filled 2014-11-24: qty 10

## 2014-11-24 MED ORDER — BSS IO SOLN
INTRAOCULAR | Status: AC
  Filled 2014-11-24: qty 15

## 2014-11-24 MED ORDER — PROPOFOL 10 MG/ML IV BOLUS
INTRAVENOUS | Status: AC
  Filled 2014-11-24: qty 20

## 2014-11-24 MED ORDER — LIDOCAINE HCL 2 % IJ SOLN
INTRAMUSCULAR | Status: DC | PRN
Start: 2014-11-24 — End: 2014-11-24
  Administered 2014-11-24: 7 mL

## 2014-11-24 MED ORDER — SODIUM HYALURONATE 10 MG/ML IO SOLN
INTRAOCULAR | Status: AC
  Filled 2014-11-24: qty 0.85

## 2014-11-24 MED ORDER — LIDOCAINE HCL 2 % IJ SOLN
INTRAMUSCULAR | Status: AC
  Filled 2014-11-24: qty 20

## 2014-11-24 MED ORDER — LACTATED RINGERS IV SOLN
INTRAVENOUS | Status: DC | PRN
  Administered 2014-11-24: 12:00:00 via INTRAVENOUS

## 2014-11-24 MED ORDER — FENTANYL CITRATE (PF) 100 MCG/2ML IJ SOLN
INTRAMUSCULAR | Status: DC | PRN
Start: 2014-11-24 — End: 2014-11-24
  Administered 2014-11-24: 50 ug via INTRAVENOUS
  Administered 2014-11-24 (×2): 25 ug via INTRAVENOUS

## 2014-11-24 MED ORDER — DEXAMETHASONE SODIUM PHOSPHATE 10 MG/ML IJ SOLN
INTRAMUSCULAR | Status: AC
  Filled 2014-11-24: qty 1

## 2014-11-24 MED ORDER — BSS PLUS IO SOLN
INTRAOCULAR | Status: AC
  Filled 2014-11-24: qty 500

## 2014-11-24 MED ORDER — GENTAMICIN SULFATE 40 MG/ML IJ SOLN
INTRAMUSCULAR | Status: AC
  Filled 2014-11-24: qty 2

## 2014-11-24 MED ORDER — LACTATED RINGERS IV SOLN
INTRAVENOUS | Status: DC
  Administered 2014-11-24: 10 mL/h via INTRAVENOUS

## 2014-11-24 MED ORDER — GATIFLOXACIN 0.5 % OP SOLN
1.0000 [drp] | OPHTHALMIC | Status: AC | PRN
  Administered 2014-11-24 (×3): 1 [drp] via OPHTHALMIC
  Filled 2014-11-24: qty 2.5

## 2014-11-24 MED ORDER — HYPROMELLOSE (GONIOSCOPIC) 2.5 % OP SOLN
OPHTHALMIC | Status: AC
  Filled 2014-11-24: qty 15

## 2014-11-24 MED ORDER — FENTANYL CITRATE (PF) 250 MCG/5ML IJ SOLN
INTRAMUSCULAR | Status: AC
  Filled 2014-11-24: qty 5

## 2014-11-24 MED ORDER — CEFAZOLIN SODIUM 1-5 GM-% IV SOLN
INTRAVENOUS | Status: AC
  Filled 2014-11-24: qty 50

## 2014-11-24 MED ORDER — EPINEPHRINE HCL 1 MG/ML IJ SOLN
INTRAMUSCULAR | Status: AC
Start: 2014-11-24 — End: 2014-11-24
  Filled 2014-11-24: qty 1

## 2014-11-24 MED ORDER — 0.9 % SODIUM CHLORIDE (POUR BTL) OPTIME
TOPICAL | Status: DC | PRN
  Administered 2014-11-24: 100 mL

## 2014-11-24 MED ORDER — TROPICAMIDE 1 % OP SOLN
1.0000 [drp] | OPHTHALMIC | Status: AC | PRN
  Administered 2014-11-24 (×3): 1 [drp] via OPHTHALMIC
  Filled 2014-11-24: qty 3

## 2014-11-24 SURGICAL SUPPLY — 64 items
APPLICATOR COTTON TIP 6IN STRL (MISCELLANEOUS) ×2 IMPLANT
APPLICATOR DR MATTHEWS STRL (MISCELLANEOUS) IMPLANT
BLADE 10 SAFETY STRL DISP (BLADE) ×2 IMPLANT
BLADE MVR KNIFE 20G (BLADE) IMPLANT
CANNULA ANT CHAM MAIN (OPHTHALMIC RELATED) IMPLANT
CANNULA VLV SOFT TIP 25GA (OPHTHALMIC) ×2 IMPLANT
CORDS BIPOLAR (ELECTRODE) IMPLANT
COVER MAYO STAND STRL (DRAPES) ×2 IMPLANT
DRAPE INCISE 51X51 W/FILM STRL (DRAPES) ×2 IMPLANT
DRAPE OPHTHALMIC 77X100 STRL (CUSTOM PROCEDURE TRAY) ×2 IMPLANT
ERASER HMR WETFIELD 23G BP (MISCELLANEOUS) IMPLANT
FILTER BLUE MILLIPORE (MISCELLANEOUS) IMPLANT
FORCEPS ECKARDT ILM 25G SERR (OPHTHALMIC RELATED) IMPLANT
FORCEPS GRIESHABER ILM 25G A (INSTRUMENTS) IMPLANT
FORCEPS HORIZONTAL 25G DISP (OPHTHALMIC RELATED) IMPLANT
FORCEPS ILM 25G DSP TIP (MISCELLANEOUS) IMPLANT
GAS AUTO FILL CONSTEL (OPHTHALMIC)
GAS AUTO FILL CONSTELLATION (OPHTHALMIC) IMPLANT
GLOVE SS BIOGEL STRL SZ 8.5 (GLOVE) ×1 IMPLANT
GLOVE SUPERSENSE BIOGEL SZ 8.5 (GLOVE) ×1
GLOVE SURG SS PI 7.0 STRL IVOR (GLOVE) ×2 IMPLANT
GOWN STRL REUS W/ TWL LRG LVL3 (GOWN DISPOSABLE) ×1 IMPLANT
GOWN STRL REUS W/ TWL XL LVL3 (GOWN DISPOSABLE) ×1 IMPLANT
GOWN STRL REUS W/TWL LRG LVL3 (GOWN DISPOSABLE) ×1
GOWN STRL REUS W/TWL XL LVL3 (GOWN DISPOSABLE) ×1
HANDLE PNEUMATIC FOR CONSTEL (OPHTHALMIC) IMPLANT
KIT BASIN OR (CUSTOM PROCEDURE TRAY) ×2 IMPLANT
KIT ROOM TURNOVER OR (KITS) ×2 IMPLANT
KNIFE CRESCENT 2.5 55 ANG (BLADE) IMPLANT
LENS BIOM SUPER VIEW SET DISP (OPHTHALMIC RELATED) ×2 IMPLANT
MICROPICK 25G (MISCELLANEOUS)
NEEDLE 18GX1X1/2 (RX/OR ONLY) (NEEDLE) IMPLANT
NEEDLE 25GX 5/8IN NON SAFETY (NEEDLE) ×2 IMPLANT
NEEDLE FILTER BLUNT 18X 1/2SAF (NEEDLE) ×1
NEEDLE FILTER BLUNT 18X1 1/2 (NEEDLE) ×1 IMPLANT
NEEDLE HYPO 25GX1X1/2 BEV (NEEDLE) IMPLANT
NEEDLE HYPO 30X.5 LL (NEEDLE) IMPLANT
NS IRRIG 1000ML POUR BTL (IV SOLUTION) ×2 IMPLANT
OIL SILICONE OPHTHALMIC ADAPTO (Ophthalmic Related) ×2 IMPLANT
PACK FRAGMATOME (OPHTHALMIC) IMPLANT
PACK VITRECTOMY CUSTOM (CUSTOM PROCEDURE TRAY) ×2 IMPLANT
PACK VITRECTOMY PIC MCHSVP (PACKS) IMPLANT
PAD ARMBOARD 7.5X6 YLW CONV (MISCELLANEOUS) ×2 IMPLANT
PAK PIK VITRECTOMY CVS 25GA (OPHTHALMIC) ×2 IMPLANT
PENCIL BIPOLAR 25GA STR DISP (OPHTHALMIC RELATED) IMPLANT
PICK MICROPICK 25G (MISCELLANEOUS) IMPLANT
PROBE COHERENT CURVED (MISCELLANEOUS) IMPLANT
PROBE LASER ILLUM FLEX CVD 25G (OPHTHALMIC) IMPLANT
ROLLS DENTAL (MISCELLANEOUS) IMPLANT
SCRAPER DIAMOND 25GA (OPHTHALMIC RELATED) IMPLANT
SET INJECTOR OIL FLUID CONSTEL (OPHTHALMIC) ×2 IMPLANT
STOCKINETTE IMPERVIOUS 9X36 MD (GAUZE/BANDAGES/DRESSINGS) ×4 IMPLANT
STOPCOCK 4 WAY LG BORE MALE ST (IV SETS) IMPLANT
SUT ETHILON 10 0 CS140 6 (SUTURE) IMPLANT
SUT ETHILON 8 0 BV130 4 (SUTURE) IMPLANT
SUT MERSILENE 5 0 RD 1 DA (SUTURE) IMPLANT
SUT PROLENE 10 0 CIF 4 DA (SUTURE) IMPLANT
SUT VICRYL 7 0 TG140 8 (SUTURE) ×2 IMPLANT
SYR 30ML SLIP (SYRINGE) IMPLANT
SYR 5ML LL (SYRINGE) IMPLANT
SYR TB 1ML LUER SLIP (SYRINGE) ×2 IMPLANT
TOWEL OR 17X26 10 PK STRL BLUE (TOWEL DISPOSABLE) ×4 IMPLANT
WATER STERILE IRR 1000ML POUR (IV SOLUTION) ×2 IMPLANT
WIPE INSTRUMENT VISIWIPE 73X73 (MISCELLANEOUS) ×2 IMPLANT

## 2014-11-24 NOTE — Transfer of Care (Signed)
Immediate Anesthesia Transfer of Care Note  Patient: Kara Knapp  Procedure(s) Performed: Procedure(s) with comments: PARS PLANA VITRECTOMY WITH 25 GAUGE (Left) PHOTOCOAGULATION WITH LASER (Left) - mac , with insertion of 5000 centistokes of silicone oil   Patient Location: PACU  Anesthesia Type:MAC  Level of Consciousness: awake, alert , oriented and patient cooperative  Airway & Oxygen Therapy: Patient Spontanous Breathing and Patient connected to nasal cannula oxygen  Post-op Assessment: Report given to RN and Post -op Vital signs reviewed and stable  Post vital signs: Reviewed and stable  Last Vitals:  Filed Vitals:   11/24/14 1611  BP: 113/66  Pulse: 63  Temp:   Resp: 22    Complications: No apparent anesthesia complications

## 2014-11-24 NOTE — Anesthesia Preprocedure Evaluation (Addendum)
Anesthesia Evaluation  Patient identified by MRN, date of birth, ID band Patient awake    Reviewed: Allergy & Precautions, NPO status , Patient's Chart, lab work & pertinent test results  History of Anesthesia Complications Negative for: history of anesthetic complications  Airway Mallampati: II  TM Distance: >3 FB Neck ROM: Full    Dental  (+) Dental Advisory Given, Edentulous Upper,    Pulmonary asthma , Current Smoker,  breath sounds clear to auscultation        Cardiovascular hypertension, Pt. on medications - angina- Past MI and - CHF - dysrhythmias - Valvular Problems/MurmursRhythm:Regular     Neuro/Psych PSYCHIATRIC DISORDERS Depression negative neurological ROS     GI/Hepatic negative GI ROS, Neg liver ROS,   Endo/Other  diabetes, Type 2, Insulin DependentMorbid obesity  Renal/GU Renal InsufficiencyRenal disease     Musculoskeletal negative musculoskeletal ROS (+)   Abdominal   Peds  Hematology  (+) anemia ,   Anesthesia Other Findings   Reproductive/Obstetrics                           Anesthesia Physical Anesthesia Plan  ASA: III  Anesthesia Plan: MAC   Post-op Pain Management:    Induction: Intravenous  Airway Management Planned: Nasal Cannula  Additional Equipment:   Intra-op Plan:   Post-operative Plan:   Informed Consent: I have reviewed the patients History and Physical, chart, labs and discussed the procedure including the risks, benefits and alternatives for the proposed anesthesia with the patient or authorized representative who has indicated his/her understanding and acceptance.   Dental advisory given  Plan Discussed with: CRNA, Anesthesiologist and Surgeon  Anesthesia Plan Comments:         Anesthesia Quick Evaluation

## 2014-11-24 NOTE — H&P (Signed)
Patient is a 44 year old woman with a history of neglected diabetes mellitus and extensive end organ disease including diabetic retinopathy profound visual loss and proliferative diabetic retinopathy.  Patient has dense nonclearing vitreous hemorrhage of the left eye. Preoperative vision in the left eye is now on hand motion on which is  not improving. This condition has impact on activities of daily living with profound vision loss the left eye.  Findings of the left eye are dense vitreous hemorrhage found on B-scan ultrasonography. Is no retinal detachment seen.  Past medical history as outlined in the remainder the problem list.  Vital signs are stable normal.  Patient is alert oriented and on of of appropriate affect and mentally clear.  Impression #1 dense nonclearing vitreous hemorrhage, secondary to proliferative diabetic retinopathy. #2 insulin-dependent diabetes mellitus, past history of poor control poor compliance. Plan #1 posterior vitrectomy with endolaser left eye and insertion of silicone oil-vitreous substitute-implant left eye in order to regain ambulatory vision.  Patient understands the risk of anesthesia including the rare occurrence death and also to the eye from the underlying condition including but limited to hemorrhage, infection, scarring, need for another surgery, and progressive disease but vision loss and and even loss of the eye. The fascia was to proceed

## 2014-11-24 NOTE — Brief Op Note (Signed)
Preoperative diagnoses #1 dense vitreous nonclearing vitreous hemorrhage of the left eye #2 progressive proliferative diabetic retinopathy left eye #3 history of neglected diabetes mellitus and advanced diabetic retinopathy. Postoperative diagnoses number 1-3   Procedure; #1 posterior vitrectomy with panretinal endolaser photocoagulation left eye #2 insertion of vitreous substitute-silicone [id number] cSt  Surgeon: Hurman Horn M.D.  Anesthesia local retrobulbar with monitored anesthesia control  Indication for procedure. Patient is a 44 year old woman with advanced diabetic retinopathy 9) vitreous hemorrhage left eye impairment of activities daily living loss of vision and the need for ambulatory vision. Patient says attempt to clear the vitreous media opacity so as to allow for laser photocoagulation more poorly to allow for insertion of placement of silicone oil left eye and to allow for visual functioning and visual building on long-term basis of.  Patient understands risks of anesthesia including her current glasses-underlying condition including but not limited to hemorrhage, infection, scarring, need for another surgery, no change of vision, loss of vision and progression of disease despite intervention.  After appropriate signed consent was obtained, the patient was taken to the operating room. In the operating room Propacet with timeout was carried out with the surgical staff and an surgeon thereafter no mild sedation 2% Xylocaine 5 mL injected retrobulbar with additional 5 mL laterally vascular 5 and ([regions daily prepped and draped usual valvular fashion. Second surgical timeout was carried out surgeon T. 5-gauge trocar placed in the vertebral quadrant patient verified visually infusion turned on peers per trochars applied. Cortectomy was begun. No findings of in of inspissated vitreous hemorrhage in the vitreous base and inferiorly scleral depression was then used to shave the vitreous shave  these areas and clear the medial opacification. Preretinal hemorrhages aspirated. Endolaser photo ablation Place peripherally. At this time the superonasal sclerotomy was and was in open with a conjunctiva peritomy and enlarged with an MVR blade. 18-gauge cannula was then used to deliver passively silicone oil sent [place] 4540 cSt and to just approximated 90% oil fill.Marland Kitchen Described as close a several Vicryl suture. Conjunctiva closed as well. Remaining scar visual encloses several black suture. Subconjunctival Decadron applied. Sterile patch traction applied. Intraocular pressure had been assessed ophthalmic normal. Patient awake and taken to the PACU to the PACU in good and stable edition.  Patient be discharged home as an outpatient today.

## 2014-11-24 NOTE — Progress Notes (Signed)
Dr Glennon Mac aware pt has repeat cbg of 70.  States to give pt crackers and peanut butter. May cont plan to discharge home

## 2014-11-25 ENCOUNTER — Encounter (HOSPITAL_COMMUNITY): Payer: Self-pay | Admitting: Ophthalmology

## 2014-11-25 NOTE — Anesthesia Postprocedure Evaluation (Signed)
  Anesthesia Post-op Note  Patient: Kara Knapp  Procedure(s) Performed: Procedure(s) with comments: PARS PLANA VITRECTOMY WITH 25 GAUGE (Left) PHOTOCOAGULATION WITH LASER (Left) - with insertion of silicone oil  Patient Location: PACU  Anesthesia Type:General  Level of Consciousness: awake and alert   Airway and Oxygen Therapy: Patient Spontanous Breathing  Post-op Pain: mild  Post-op Assessment: Post-op Vital signs reviewed  Post-op Vital Signs: stable  Last Vitals:  Filed Vitals:   11/24/14 1654  BP: 132/76  Pulse: 67  Temp:   Resp: 18    Complications: No apparent anesthesia complications

## 2014-12-21 ENCOUNTER — Encounter (HOSPITAL_COMMUNITY): Payer: Self-pay | Admitting: Emergency Medicine

## 2014-12-21 ENCOUNTER — Emergency Department (HOSPITAL_COMMUNITY)
Admission: EM | Admit: 2014-12-21 | Discharge: 2014-12-21 | Disposition: A | Discharge status: Home / Self Care | Payer: Medicaid Other | Attending: Emergency Medicine | Admitting: Emergency Medicine

## 2014-12-21 DIAGNOSIS — Z794 Long term (current) use of insulin: Secondary | ICD-10-CM | POA: Insufficient documentation

## 2014-12-21 DIAGNOSIS — Z8739 Personal history of other diseases of the musculoskeletal system and connective tissue: Secondary | ICD-10-CM | POA: Insufficient documentation

## 2014-12-21 DIAGNOSIS — I10 Essential (primary) hypertension: Secondary | ICD-10-CM | POA: Insufficient documentation

## 2014-12-21 DIAGNOSIS — S80812A Abrasion, left lower leg, initial encounter: Secondary | ICD-10-CM | POA: Insufficient documentation

## 2014-12-21 DIAGNOSIS — Y9389 Activity, other specified: Secondary | ICD-10-CM | POA: Diagnosis not present

## 2014-12-21 DIAGNOSIS — Z792 Long term (current) use of antibiotics: Secondary | ICD-10-CM | POA: Insufficient documentation

## 2014-12-21 DIAGNOSIS — Z79899 Other long term (current) drug therapy: Secondary | ICD-10-CM | POA: Diagnosis not present

## 2014-12-21 DIAGNOSIS — X58XXXA Exposure to other specified factors, initial encounter: Secondary | ICD-10-CM | POA: Insufficient documentation

## 2014-12-21 DIAGNOSIS — Z23 Encounter for immunization: Secondary | ICD-10-CM | POA: Insufficient documentation

## 2014-12-21 DIAGNOSIS — Y998 Other external cause status: Secondary | ICD-10-CM | POA: Insufficient documentation

## 2014-12-21 DIAGNOSIS — G629 Polyneuropathy, unspecified: Secondary | ICD-10-CM | POA: Insufficient documentation

## 2014-12-21 DIAGNOSIS — E1140 Type 2 diabetes mellitus with diabetic neuropathy, unspecified: Secondary | ICD-10-CM | POA: Insufficient documentation

## 2014-12-21 DIAGNOSIS — Z8701 Personal history of pneumonia (recurrent): Secondary | ICD-10-CM | POA: Insufficient documentation

## 2014-12-21 DIAGNOSIS — J45909 Unspecified asthma, uncomplicated: Secondary | ICD-10-CM | POA: Diagnosis not present

## 2014-12-21 DIAGNOSIS — S8992XA Unspecified injury of left lower leg, initial encounter: Secondary | ICD-10-CM | POA: Diagnosis present

## 2014-12-21 DIAGNOSIS — Z720 Tobacco use: Secondary | ICD-10-CM | POA: Insufficient documentation

## 2014-12-21 DIAGNOSIS — Y9289 Other specified places as the place of occurrence of the external cause: Secondary | ICD-10-CM | POA: Diagnosis not present

## 2014-12-21 DIAGNOSIS — E785 Hyperlipidemia, unspecified: Secondary | ICD-10-CM | POA: Diagnosis not present

## 2014-12-21 MED ORDER — TETANUS-DIPHTH-ACELL PERTUSSIS 5-2.5-18.5 LF-MCG/0.5 IM SUSP
0.5000 mL | Freq: Once | INTRAMUSCULAR | Status: AC
  Administered 2014-12-21: 0.5 mL via INTRAMUSCULAR
  Filled 2014-12-21: qty 0.5

## 2014-12-21 NOTE — ED Notes (Signed)
Injury to left lower ankle with branch from tree on Thursday night.  Pt has been dressing abrasion with peroxide and water, neosporin and dressing daily.  Today when dressing removed and drainage was yellow.  Pt do not know last Tetanus.  Denies pain at this time.

## 2014-12-21 NOTE — Discharge Instructions (Signed)
Abrasion An abrasion is a cut or scrape of the skin. Abrasions do not extend through all layers of the skin and most heal within 10 days. It is important to care for your abrasion properly to prevent infection. CAUSES  Most abrasions are caused by falling on, or gliding across, the ground or other surface. When your skin rubs on something, the outer and inner layer of skin rubs off, causing an abrasion. DIAGNOSIS  Your caregiver will be able to diagnose an abrasion during a physical exam.  TREATMENT  Your treatment depends on how large and deep the abrasion is. Generally, your abrasion will be cleaned with water and a mild soap to remove any dirt or debris. An antibiotic ointment may be put over the abrasion to prevent an infection. A bandage (dressing) may be wrapped around the abrasion to keep it from getting dirty.  You may need a tetanus shot if:  You cannot remember when you had your last tetanus shot.  You have never had a tetanus shot.  The injury broke your skin. If you get a tetanus shot, your arm may swell, get red, and feel warm to the touch. This is common and not a problem. If you need a tetanus shot and you choose not to have one, there is a rare chance of getting tetanus. Sickness from tetanus can be serious.  HOME CARE INSTRUCTIONS   If a dressing was applied, change it at least once a day or as directed by your caregiver. If the bandage sticks, soak it off with warm water.   Wash the area with water and a mild soap to remove all the ointment 2 times a day. Rinse off the soap and pat the area dry with a clean towel.   Reapply any ointment as directed by your caregiver. This will help prevent infection and keep the bandage from sticking. Use gauze over the wound and under the dressing to help keep the bandage from sticking.   Change your dressing right away if it becomes wet or dirty.   Only take over-the-counter or prescription medicines for pain, discomfort, or fever as  directed by your caregiver.   Follow up with your caregiver within 24-48 hours for a wound check, or as directed. If you were not given a wound-check appointment, look closely at your abrasion for redness, swelling, or pus. These are signs of infection. SEEK IMMEDIATE MEDICAL CARE IF:   You have increasing pain in the wound.   You have redness, swelling, or tenderness around the wound.   You have pus coming from the wound.   You have a fever or persistent symptoms for more than 2-3 days.  You have a fever and your symptoms suddenly get worse.  You have a bad smell coming from the wound or dressing.  MAKE SURE YOU:   Understand these instructions.  Will watch your condition.  Will get help right away if you are not doing well or get worse. Document Released: 04/25/2005 Document Revised: 07/02/2012 Document Reviewed: 06/19/2011 [facility] Patient Information 2015 [place], [place]. This information is not intended to replace advice given to you by your health care provider. Make sure you discuss any questions you have with your health care provider.   Finish the Keflex you are currently taking.  Wash your abrasion twice daily with mild soap and water.  Stop using hydrogen peroxide at this point as this can slow down the healing process.  You may leave the wound open as discussed.  However if you are going to be outdoors or performing an activity that may cause dirt or debris contamination you will want to cover the wound.

## 2014-12-22 NOTE — ED Provider Notes (Signed)
CSN: [id number]     Arrival date & time 12/21/14  1130 History   First MD Initiated Contact with Patient 12/21/14 1208     Chief Complaint  Patient presents with  . Leg Injury     (Consider location/radiation/quality/duration/timing/severity/associated sxs/prior Treatment) The history is provided by the patient.   Kara Knapp is a 44 y.o. female with a past medical history of DM, neuropathy and prior left middle toe osteomyelitis presenting with an abrasion of her left lower ankle which occurred when she scraped it on a tree branch 5 days ago. She has been washing the site with peroxide daily,applying neosporin and keeping covered. Today when she removed the dressing there was clear yellow appearing drainage on the bandage. She denies pain at the site without radiating symptoms.  Also denies fevers, chills or other complaint. She is not utd with her tetanus vaccine. Her cbg checked this am was around 130 per pt report.     Past Medical History  Diagnosis Date  . Diabetes mellitus   . Neuropathy   . Asthma   . Hypertension   . Osteomyelitis     left middle toe   . Cataract   . Bronchitis   . Pneumonia   . Depression   . Neuropathy due to secondary diabetes mellitus   . Hyperlipidemia    Past Surgical History  Procedure Laterality Date  . Cesarean section    . Tubal ligation    . Cholecystectomy N/A 05/25/2013    Procedure: LAPAROSCOPIC CHOLECYSTECTOMY;  Surgeon: Jamesetta So, MD;  Location: [facility];  Service: General;  Laterality: N/A;  . Eye surgery    . Pars plana vitrectomy Left 11/24/2014    Procedure: PARS PLANA VITRECTOMY WITH 25 GAUGE;  Surgeon: Hurman Horn, MD;  Location: [facility];  Service: Ophthalmology;  Laterality: Left;  . Photocoagulation with laser Left 11/24/2014    Procedure: PHOTOCOAGULATION WITH LASER;  Surgeon: Hurman Horn, MD;  Location: [facility];  Service: Ophthalmology;  Laterality: Left;  with insertion of silicone oil   Family History  Problem  Relation Age of Onset  . COPD Mother   . Cancer Father   . Diabetes Sister   . Diabetes Brother    History  Substance Use Topics  . Smoking status: Current Every Day Smoker -- 0.50 packs/day for 20 years    Types: Cigarettes  . Smokeless tobacco: Never Used  . Alcohol Use: No   OB History    Gravida Para Term Preterm AB TAB SAB Ectopic Multiple Living   5 5             Review of Systems  Constitutional: Negative for fever and chills.  Respiratory: Negative for shortness of breath and wheezing.   Skin: Positive for wound.  Neurological: Negative for numbness.      Allergies  Bactrim and Prednisone  Home Medications   Prior to Admission medications   Medication Sig Start Date End Date Taking? Authorizing Provider  acetaminophen (TYLENOL) 500 MG tablet Take 1,000 mg by mouth every 6 (six) hours as needed. For pain   Yes Historical Provider, MD  albuterol (PROVENTIL HFA;VENTOLIN HFA) 108 (90 BASE) MCG/ACT inhaler Inhale 2 puffs into the lungs every 4 (four) hours as needed for wheezing or shortness of breath. 05/15/14  Yes Lily Kocher, PA-C  amLODipine (NORVASC) 5 MG tablet Take 5 mg by mouth daily. 11/23/14  Yes Historical Provider, MD  azelastine (ASTELIN) 0.1 % nasal spray Place 2  sprays into both nostrils 2 (two) times daily. 11/23/14  Yes Historical Provider, MD  brimonidine-timolol (COMBIGAN) 0.2-0.5 % ophthalmic solution Place 1 drop into the right eye every 12 (twelve) hours.    Yes Historical Provider, MD  budesonide-formoterol (SYMBICORT) 160-4.5 MCG/ACT inhaler Inhale 2 puffs into the lungs 2 (two) times daily as needed (shortness of breath).   Yes Historical Provider, MD  cephALEXin (KEFLEX) 500 MG capsule Take 1 capsule by mouth 3 (three) times daily. Starting 12/14/2014 x 7 days 12/13/14  Yes Historical Provider, MD  citalopram (CELEXA) 20 MG tablet Take 20 mg by mouth daily. 10/20/14  Yes Historical Provider, MD  furosemide (LASIX) 20 MG tablet Take 1 tablet by mouth  daily. 12/14/14  Yes Historical Provider, MD  gabapentin (NEURONTIN) 300 MG capsule Take 600 mg by mouth 3 (three) times daily.    Yes Historical Provider, MD  HUMALOG KWIKPEN 100 UNIT/ML KiwkPen Inject 5 Units into the skin 4 (four) times daily -  with meals and at bedtime. 12/13/14  Yes Historical Provider, MD  hydrALAZINE (APRESOLINE) 25 MG tablet Take 25 mg by mouth 3 (three) times daily.    Yes Historical Provider, MD  hydrochlorothiazide (HYDRODIURIL) 25 MG tablet Take 25 mg by mouth daily.   Yes Historical Provider, MD  ibuprofen (ADVIL,MOTRIN) 600 MG tablet Take 600 mg by mouth 4 (four) times daily as needed (pain).  08/30/14  Yes Historical Provider, MD  ofloxacin (OCUFLOX) 0.3 % ophthalmic solution Place 1 drop into the left eye 4 (four) times daily. 11/19/14  Yes Historical Provider, MD  pravastatin (PRAVACHOL) 40 MG tablet Take 40 mg by mouth daily. 10/15/14  Yes Historical Provider, MD  simvastatin (ZOCOR) 5 MG tablet Take 5 mg by mouth daily. 12/13/14  Yes Historical Provider, MD  sodium bicarbonate 650 MG tablet Take 650 mg by mouth daily.   Yes Historical Provider, MD  timolol (TIMOPTIC) 0.5 % ophthalmic solution Place 1 drop into the left eye daily. 12/13/14  Yes Historical Provider, MD  guaiFENesin-codeine 100-10 MG/5ML syrup Take 10 mLs by mouth every 6 (six) hours as needed for cough. Patient not taking: Reported on 11/19/2014 09/05/14   Veryl Speak, MD  Phenyleph-Promethazine-Cod 5-6.25-10 MG/5ML SYRP 5m po q6h prn cough/congestion Patient not taking: Reported on 11/19/2014 05/15/14   HLily Kocher PA-C   BP 119/73 mmHg  Pulse 78  Temp(Src) 97.9 F (36.6 C) (Oral)  Resp 16  Ht 5' 3"  (1.6 m)  Wt 178 lb (80.74 kg)  BMI 31.54 kg/m2  SpO2 97%  LMP 11/20/2014 Physical Exam  Constitutional: She is oriented to person, place, and time. She appears well-developed and well-nourished.  HENT:  Head: Normocephalic.  Cardiovascular: Normal rate.   Pulmonary/Chest: Effort normal.   Musculoskeletal: She exhibits no edema or tenderness.  Neurological: She is alert and oriented to person, place, and time. No sensory deficit.  Skin: Laceration noted.  4 cm in length superficial abrasion left medial lower leg. There is no edema, surrounding erythema, red streaking, induration or fluctuance. Wound appears dry at this time.    ED Course  Procedures (including critical care time) Labs Review Labs Reviewed - No data to display  Imaging Review No results found.   EKG Interpretation None      MDM   Final diagnoses:  Abrasion of leg, left, initial encounter    No signs/sx of infection at this time. Her tetanus was updated. She was advised to stop using peroxide which can delay healing. Advised soap and water  wash bid, cover, continue using abx ointment.  Recheck by pcp or return here if wound does not continue to improve.  Tetanus updated.   Evalee Jefferson, PA-C 12/22/14 2107  Elnora Morrison, MD 12/28/14 [phone]

## 2015-01-21 ENCOUNTER — Encounter (HOSPITAL_COMMUNITY): Payer: Self-pay | Admitting: Emergency Medicine

## 2015-01-21 ENCOUNTER — Inpatient Hospital Stay (HOSPITAL_COMMUNITY): Payer: Medicaid Other

## 2015-01-21 ENCOUNTER — Inpatient Hospital Stay (HOSPITAL_COMMUNITY)
Admission: EM | Admit: 2015-01-21 | Discharge: 2015-01-23 | DRG: 291 | Disposition: A | Discharge status: Home / Self Care | Payer: Medicaid Other | Attending: Internal Medicine | Admitting: Internal Medicine

## 2015-01-21 ENCOUNTER — Emergency Department (HOSPITAL_COMMUNITY): Payer: Medicaid Other

## 2015-01-21 DIAGNOSIS — E785 Hyperlipidemia, unspecified: Secondary | ICD-10-CM | POA: Diagnosis present

## 2015-01-21 DIAGNOSIS — Z825 Family history of asthma and other chronic lower respiratory diseases: Secondary | ICD-10-CM

## 2015-01-21 DIAGNOSIS — E1121 Type 2 diabetes mellitus with diabetic nephropathy: Secondary | ICD-10-CM | POA: Diagnosis present

## 2015-01-21 DIAGNOSIS — I5033 Acute on chronic diastolic (congestive) heart failure: Principal | ICD-10-CM

## 2015-01-21 DIAGNOSIS — I129 Hypertensive chronic kidney disease with stage 1 through stage 4 chronic kidney disease, or unspecified chronic kidney disease: Secondary | ICD-10-CM | POA: Diagnosis present

## 2015-01-21 DIAGNOSIS — Z794 Long term (current) use of insulin: Secondary | ICD-10-CM | POA: Diagnosis not present

## 2015-01-21 DIAGNOSIS — R0602 Shortness of breath: Secondary | ICD-10-CM | POA: Diagnosis not present

## 2015-01-21 DIAGNOSIS — F17200 Nicotine dependence, unspecified, uncomplicated: Secondary | ICD-10-CM | POA: Diagnosis present

## 2015-01-21 DIAGNOSIS — E11319 Type 2 diabetes mellitus with unspecified diabetic retinopathy without macular edema: Secondary | ICD-10-CM

## 2015-01-21 DIAGNOSIS — E1165 Type 2 diabetes mellitus with hyperglycemia: Secondary | ICD-10-CM | POA: Diagnosis present

## 2015-01-21 DIAGNOSIS — E1140 Type 2 diabetes mellitus with diabetic neuropathy, unspecified: Secondary | ICD-10-CM | POA: Diagnosis present

## 2015-01-21 DIAGNOSIS — D638 Anemia in other chronic diseases classified elsewhere: Secondary | ICD-10-CM

## 2015-01-21 DIAGNOSIS — I509 Heart failure, unspecified: Secondary | ICD-10-CM

## 2015-01-21 DIAGNOSIS — J45909 Unspecified asthma, uncomplicated: Secondary | ICD-10-CM | POA: Diagnosis present

## 2015-01-21 DIAGNOSIS — N186 End stage renal disease: Secondary | ICD-10-CM | POA: Diagnosis present

## 2015-01-21 DIAGNOSIS — Z833 Family history of diabetes mellitus: Secondary | ICD-10-CM

## 2015-01-21 DIAGNOSIS — E1122 Type 2 diabetes mellitus with diabetic chronic kidney disease: Secondary | ICD-10-CM

## 2015-01-21 DIAGNOSIS — I5032 Chronic diastolic (congestive) heart failure: Secondary | ICD-10-CM | POA: Diagnosis present

## 2015-01-21 DIAGNOSIS — F17210 Nicotine dependence, cigarettes, uncomplicated: Secondary | ICD-10-CM | POA: Diagnosis present

## 2015-01-21 DIAGNOSIS — D631 Anemia in chronic kidney disease: Secondary | ICD-10-CM | POA: Diagnosis present

## 2015-01-21 DIAGNOSIS — N184 Chronic kidney disease, stage 4 (severe): Secondary | ICD-10-CM

## 2015-01-21 DIAGNOSIS — J9601 Acute respiratory failure with hypoxia: Secondary | ICD-10-CM | POA: Diagnosis present

## 2015-01-21 DIAGNOSIS — Z992 Dependence on renal dialysis: Secondary | ICD-10-CM

## 2015-01-21 HISTORY — DX: Benign neoplasm of connective and other soft tissue, unspecified: D21.9

## 2015-01-21 HISTORY — DX: Acute cholecystitis: K81.0

## 2015-01-21 HISTORY — DX: Reserved for inherently not codable concepts without codable children: IMO0001

## 2015-01-21 HISTORY — DX: Anemia in other chronic diseases classified elsewhere: D63.8

## 2015-01-21 HISTORY — DX: Long term (current) use of insulin: Z79.4

## 2015-01-21 HISTORY — DX: Type 2 diabetes mellitus with unspecified diabetic retinopathy without macular edema: E11.319

## 2015-01-21 LAB — COMPREHENSIVE METABOLIC PANEL
ALT: 19 U/L (ref 14–54)
AST: 16 U/L (ref 15–41)
Albumin: 3.1 g/dL — ABNORMAL LOW (ref 3.5–5.0)
Alkaline Phosphatase: 94 U/L (ref 38–126)
Anion gap: 9 (ref 5–15)
BILIRUBIN TOTAL: 0.5 mg/dL (ref 0.3–1.2)
BUN: 37 mg/dL — ABNORMAL HIGH (ref 6–20)
CALCIUM: 8.5 mg/dL — AB (ref 8.9–10.3)
CHLORIDE: 108 mmol/L (ref 101–111)
CO2: 21 mmol/L — AB (ref 22–32)
CREATININE: 3.14 mg/dL — AB (ref 0.44–1.00)
GFR calc Af Amer: 19 mL/min — ABNORMAL LOW (ref >60)
GFR calc non Af Amer: 17 mL/min — ABNORMAL LOW (ref >60)
GLUCOSE: 206 mg/dL — AB (ref 65–99)
Potassium: 5 mmol/L (ref 3.5–5.1)
Sodium: 138 mmol/L (ref 135–145)
Total Protein: 6.9 g/dL (ref 6.5–8.1)

## 2015-01-21 LAB — CBC WITH DIFFERENTIAL/PLATELET
BASOS ABS: 0.1 10*3/uL (ref 0.0–0.1)
Basophils Relative: 1 % (ref 0–1)
EOS ABS: 0.2 10*3/uL (ref 0.0–0.7)
Eosinophils Relative: 2 % (ref 0–5)
HCT: 33.7 % — ABNORMAL LOW (ref 36.0–46.0)
Hemoglobin: 10.8 g/dL — ABNORMAL LOW (ref 12.0–15.0)
Lymphocytes Relative: 15 % (ref 12–46)
Lymphs Abs: 1.6 10*3/uL (ref 0.7–4.0)
MCH: 28.4 pg (ref 26.0–34.0)
MCHC: 32 g/dL (ref 30.0–36.0)
MCV: 88.7 fL (ref 78.0–100.0)
Monocytes Absolute: 0.7 10*3/uL (ref 0.1–1.0)
Monocytes Relative: 7 % (ref 3–12)
Neutro Abs: 8 10*3/uL — ABNORMAL HIGH (ref 1.7–7.7)
Neutrophils Relative %: 75 % (ref 43–77)
PLATELETS: 337 10*3/uL (ref 150–400)
RBC: 3.8 MIL/uL — ABNORMAL LOW (ref 3.87–5.11)
RDW: 16.2 % — AB (ref 11.5–15.5)
WBC: 10.6 10*3/uL — AB (ref 4.0–10.5)

## 2015-01-21 LAB — URINALYSIS, ROUTINE W REFLEX MICROSCOPIC
BILIRUBIN URINE: NEGATIVE
GLUCOSE, UA: 250 mg/dL — AB
KETONES UR: NEGATIVE mg/dL
Leukocytes, UA: NEGATIVE
Nitrite: NEGATIVE
PH: 7 (ref 5.0–8.0)
Protein, ur: 100 mg/dL — AB
SPECIFIC GRAVITY, URINE: 1.02 (ref 1.005–1.030)
Urobilinogen, UA: 0.2 mg/dL (ref 0.0–1.0)

## 2015-01-21 LAB — GLUCOSE, CAPILLARY
GLUCOSE-CAPILLARY: 218 mg/dL — AB (ref 65–99)
Glucose-Capillary: 282 mg/dL — ABNORMAL HIGH (ref 65–99)

## 2015-01-21 LAB — BLOOD GAS, ARTERIAL
Acid-base deficit: 4 mmol/L — ABNORMAL HIGH (ref 0.0–2.0)
Bicarbonate: 21.1 mEq/L (ref 20.0–24.0)
DRAWN BY: 25788
O2 Content: 4 L/min
O2 SAT: 56.7 %
PCO2 ART: 41.4 mmHg (ref 35.0–45.0)
TCO2: 19.2 mmol/L (ref 0–100)
pH, Arterial: 7.33 — ABNORMAL LOW (ref 7.350–7.450)
pO2, Arterial: 31.9 mmHg — CL (ref 80.0–100.0)

## 2015-01-21 LAB — TROPONIN I: Troponin I: <0.03 ng/mL (ref <0.031)

## 2015-01-21 LAB — POC OCCULT BLOOD, ED: FECAL OCCULT BLD: NEGATIVE

## 2015-01-21 LAB — URINE MICROSCOPIC-ADD ON

## 2015-01-21 LAB — TSH: TSH: 4.668 u[IU]/mL — ABNORMAL HIGH (ref 0.350–4.500)

## 2015-01-21 LAB — IRON AND TIBC
Iron: 44 ug/dL (ref 28–170)
SATURATION RATIOS: 12 % (ref 10.4–31.8)
TIBC: 363 ug/dL (ref 250–450)
UIBC: 319 ug/dL

## 2015-01-21 LAB — VITAMIN B12: VITAMIN B 12: 305 pg/mL (ref 180–914)

## 2015-01-21 LAB — BRAIN NATRIURETIC PEPTIDE: B Natriuretic Peptide: 437 pg/mL — ABNORMAL HIGH (ref 0.0–100.0)

## 2015-01-21 LAB — FOLATE: FOLATE: 15.9 ng/mL (ref >5.9)

## 2015-01-21 LAB — FERRITIN: Ferritin: 51 ng/mL (ref 11–307)

## 2015-01-21 MED ORDER — GUAIFENESIN-DM 100-10 MG/5ML PO SYRP: 5.0000 mL | ORAL_SOLUTION | ORAL | Status: DC | PRN

## 2015-01-21 MED ORDER — ONDANSETRON HCL 4 MG/2ML IJ SOLN: 4.0000 mg | Freq: Four times a day (QID) | INTRAMUSCULAR | Status: DC | PRN

## 2015-01-21 MED ORDER — INSULIN ASPART 100 UNIT/ML ~~LOC~~ SOLN
0.0000 [IU] | Freq: Three times a day (TID) | SUBCUTANEOUS | Status: DC
  Administered 2015-01-21: 3 [IU] via SUBCUTANEOUS
  Administered 2015-01-22: 1 [IU] via SUBCUTANEOUS
  Administered 2015-01-22: 2 [IU] via SUBCUTANEOUS
  Administered 2015-01-23: 3 [IU] via SUBCUTANEOUS

## 2015-01-21 MED ORDER — SODIUM CHLORIDE 0.9 % IV SOLN
INTRAVENOUS | Status: DC
  Administered 2015-01-21: 17:00:00 via INTRAVENOUS

## 2015-01-21 MED ORDER — TIMOLOL MALEATE 0.5 % OP SOLN
1.0000 [drp] | Freq: Two times a day (BID) | OPHTHALMIC | Status: DC
  Administered 2015-01-22: 1 [drp] via OPHTHALMIC
  Filled 2015-01-21: qty 5

## 2015-01-21 MED ORDER — CITALOPRAM HYDROBROMIDE 20 MG PO TABS
20.0000 mg | ORAL_TABLET | Freq: Every day | ORAL | Status: DC
  Administered 2015-01-21 – 2015-01-23 (×3): 20 mg via ORAL
  Filled 2015-01-21 (×3): qty 1

## 2015-01-21 MED ORDER — SODIUM BICARBONATE 650 MG PO TABS
650.0000 mg | ORAL_TABLET | Freq: Every day | ORAL | Status: DC
  Administered 2015-01-21 – 2015-01-23 (×3): 650 mg via ORAL
  Filled 2015-01-21 (×3): qty 1

## 2015-01-21 MED ORDER — BRIMONIDINE TARTRATE 0.2 % OP SOLN
1.0000 [drp] | Freq: Two times a day (BID) | OPHTHALMIC | Status: DC
  Administered 2015-01-21 – 2015-01-22 (×2): 1 [drp] via OPHTHALMIC
  Filled 2015-01-21: qty 5

## 2015-01-21 MED ORDER — CARVEDILOL 3.125 MG PO TABS
3.1250 mg | ORAL_TABLET | Freq: Two times a day (BID) | ORAL | Status: DC
  Administered 2015-01-21 – 2015-01-23 (×4): 3.125 mg via ORAL
  Filled 2015-01-21 (×4): qty 1

## 2015-01-21 MED ORDER — ENOXAPARIN SODIUM 30 MG/0.3ML ~~LOC~~ SOLN
30.0000 mg | SUBCUTANEOUS | Status: DC
  Administered 2015-01-21 – 2015-01-22 (×2): 30 mg via SUBCUTANEOUS
  Filled 2015-01-21 (×2): qty 0.3

## 2015-01-21 MED ORDER — LEVALBUTEROL HCL 0.63 MG/3ML IN NEBU: 0.6300 mg | INHALATION_SOLUTION | Freq: Four times a day (QID) | RESPIRATORY_TRACT | Status: DC | PRN

## 2015-01-21 MED ORDER — GABAPENTIN 300 MG PO CAPS
600.0000 mg | ORAL_CAPSULE | Freq: Three times a day (TID) | ORAL | Status: DC
  Administered 2015-01-21 – 2015-01-23 (×6): 600 mg via ORAL
  Filled 2015-01-21 (×6): qty 2

## 2015-01-21 MED ORDER — NITROGLYCERIN 2 % TD OINT
0.5000 [in_us] | TOPICAL_OINTMENT | Freq: Once | TRANSDERMAL | Status: AC
  Administered 2015-01-21: 0.5 [in_us] via TOPICAL
  Filled 2015-01-21: qty 1

## 2015-01-21 MED ORDER — IPRATROPIUM-ALBUTEROL 0.5-2.5 (3) MG/3ML IN SOLN
3.0000 mL | Freq: Once | RESPIRATORY_TRACT | Status: AC
  Administered 2015-01-21: 3 mL via RESPIRATORY_TRACT
  Filled 2015-01-21: qty 3

## 2015-01-21 MED ORDER — HYDRALAZINE HCL 25 MG PO TABS
25.0000 mg | ORAL_TABLET | Freq: Three times a day (TID) | ORAL | Status: DC
  Administered 2015-01-21 – 2015-01-23 (×6): 25 mg via ORAL
  Filled 2015-01-21 (×6): qty 1

## 2015-01-21 MED ORDER — NICOTINE 14 MG/24HR TD PT24
14.0000 mg | MEDICATED_PATCH | Freq: Every day | TRANSDERMAL | Status: DC
  Administered 2015-01-21: 14 mg via TRANSDERMAL
  Filled 2015-01-21 (×2): qty 1

## 2015-01-21 MED ORDER — LABETALOL HCL 200 MG PO TABS: 100.0000 mg | ORAL_TABLET | Freq: Two times a day (BID) | ORAL | Status: DC

## 2015-01-21 MED ORDER — BRIMONIDINE TARTRATE-TIMOLOL 0.2-0.5 % OP SOLN: 1.0000 [drp] | Freq: Two times a day (BID) | OPHTHALMIC | Status: DC

## 2015-01-21 MED ORDER — HYDROCODONE-ACETAMINOPHEN 5-325 MG PO TABS: 1.0000 | ORAL_TABLET | ORAL | Status: DC | PRN

## 2015-01-21 MED ORDER — ONDANSETRON HCL 4 MG PO TABS: 4.0000 mg | ORAL_TABLET | Freq: Four times a day (QID) | ORAL | Status: DC | PRN

## 2015-01-21 MED ORDER — ACETAMINOPHEN 650 MG RE SUPP: 650.0000 mg | Freq: Four times a day (QID) | RECTAL | Status: DC | PRN

## 2015-01-21 MED ORDER — ALUM & MAG HYDROXIDE-SIMETH 200-200-20 MG/5ML PO SUSP: 30.0000 mL | Freq: Four times a day (QID) | ORAL | Status: DC | PRN

## 2015-01-21 MED ORDER — ALBUTEROL SULFATE (2.5 MG/3ML) 0.083% IN NEBU
2.5000 mg | INHALATION_SOLUTION | Freq: Once | RESPIRATORY_TRACT | Status: AC
  Administered 2015-01-21: 2.5 mg via RESPIRATORY_TRACT
  Filled 2015-01-21: qty 3

## 2015-01-21 MED ORDER — FUROSEMIDE 10 MG/ML IJ SOLN
40.0000 mg | Freq: Two times a day (BID) | INTRAMUSCULAR | Status: DC
  Administered 2015-01-21 – 2015-01-22 (×3): 40 mg via INTRAVENOUS
  Filled 2015-01-21 (×4): qty 4

## 2015-01-21 MED ORDER — PRAVASTATIN SODIUM 40 MG PO TABS
40.0000 mg | ORAL_TABLET | Freq: Every day | ORAL | Status: DC
  Administered 2015-01-21 – 2015-01-23 (×3): 40 mg via ORAL
  Filled 2015-01-21 (×3): qty 1

## 2015-01-21 MED ORDER — FUROSEMIDE 10 MG/ML IJ SOLN
80.0000 mg | Freq: Once | INTRAMUSCULAR | Status: AC
  Administered 2015-01-21: 80 mg via INTRAVENOUS
  Filled 2015-01-21: qty 8

## 2015-01-21 MED ORDER — ACETAMINOPHEN 325 MG PO TABS: 650.0000 mg | ORAL_TABLET | Freq: Four times a day (QID) | ORAL | Status: DC | PRN

## 2015-01-21 MED ORDER — INSULIN GLARGINE 100 UNIT/ML ~~LOC~~ SOLN
15.0000 [IU] | Freq: Every day | SUBCUTANEOUS | Status: DC
  Administered 2015-01-21 – 2015-01-22 (×2): 15 [IU] via SUBCUTANEOUS
  Filled 2015-01-21 (×3): qty 0.15

## 2015-01-21 MED ORDER — LORATADINE 10 MG PO TABS
10.0000 mg | ORAL_TABLET | Freq: Every day | ORAL | Status: DC
  Administered 2015-01-21 – 2015-01-23 (×3): 10 mg via ORAL
  Filled 2015-01-21 (×3): qty 1

## 2015-01-21 MED ORDER — INSULIN ASPART 100 UNIT/ML ~~LOC~~ SOLN
0.0000 [IU] | Freq: Every day | SUBCUTANEOUS | Status: DC
  Administered 2015-01-21: 3 [IU] via SUBCUTANEOUS

## 2015-01-21 NOTE — ED Notes (Signed)
IV attempted x 2. Able to draw labs, but not able to establish IV.

## 2015-01-21 NOTE — Progress Notes (Signed)
Pt refuses to take eye drops because she states "I take combigan for pressure only in my left eye." Pt would like to speak with the doctor before putting anything in her eyes.

## 2015-01-21 NOTE — ED Notes (Signed)
Attempted calling report to unit 300.  RN will return call.

## 2015-01-21 NOTE — ED Notes (Signed)
Attempted twice more for IV site w/out success.

## 2015-01-21 NOTE — ED Notes (Signed)
Note from RT, PO2 31.9, however this is due to a mixed sample, per RT she stuck patient numerous times. Margarita Grizzle, primary nurse, informed.

## 2015-01-21 NOTE — ED Notes (Signed)
Two more IV attempts made w/out success by Jan Fireman, RN.  Notified J. Idol, PA-C of inability to get IV site.

## 2015-01-21 NOTE — ED Notes (Signed)
Fax sent and Med Records notified of need for notes from recent visit.

## 2015-01-21 NOTE — ED Provider Notes (Signed)
CSN: [id number]     Arrival date & time 01/21/15  76 History   First MD Initiated Contact with Patient 01/21/15 1059     Chief Complaint  Patient presents with  . Shortness of Breath     (Consider location/radiation/quality/duration/timing/severity/associated sxs/prior Treatment) The history is provided by the patient.   [facility] is a 44 y.o. female with a past medical history of type 2 diabetes, asthma and hypertension presenting with a one to two-week history of worsening shortness of breath with exertion.  She also describes chest tightness but denies chest pain is also worsened with ambulation.  She reports being admitted to [facility] one month ago with similar symptoms, is unable to list her discharged diagnosis, but was sent home with a prescription for Lasix which she is taking 20 mg twice a day.  She endorses wheezing, nonproductive cough, worsened ankle edema over the past week.  She has had no fevers or chills, no nausea, vomiting or diaphoresis.  She has found no alleviators for her symptoms.  She last used her albuterol nebulizer prior to arrival this morning with no improvement in her symptoms.  She also reports she was anemic during that admission and received transfusions.  She denies any known source of blood loss including hematuria or rectal bleeding.      Past Medical History  Diagnosis Date  . Insulin-dependent diabetes mellitus with retinopathy   . Neuropathy   . Asthma   . Hypertension   . Osteomyelitis     left middle toe   . Cataract   . Bronchitis   . Pneumonia   . Depression   . Neuropathy due to secondary diabetes mellitus   . Hyperlipidemia   . Fibroids   . Anemia of chronic disease   . Chronic kidney disease     Stage III-IV  . CHF (congestive heart failure)     Presumed diastolic  . Cholecystitis, acute 05/26/2013    Status post cholecystectomy  . Tobacco abuse    Past Surgical History  Procedure Laterality Date  . Cesarean  section    . Tubal ligation    . Cholecystectomy N/A 05/25/2013    Procedure: LAPAROSCOPIC CHOLECYSTECTOMY;  Surgeon: Jamesetta So, MD;  Location: [facility];  Service: General;  Laterality: N/A;  . Eye surgery    . Pars plana vitrectomy Left 11/24/2014    Procedure: PARS PLANA VITRECTOMY WITH 25 GAUGE;  Surgeon: Hurman Horn, MD;  Location: [place];  Service: Ophthalmology;  Laterality: Left;  . Photocoagulation with laser Left 11/24/2014    Procedure: PHOTOCOAGULATION WITH LASER;  Surgeon: Hurman Horn, MD;  Location: [place];  Service: Ophthalmology;  Laterality: Left;  with insertion of silicone oil   Family History  Problem Relation Age of Onset  . COPD Mother   . Cancer Father   . Diabetes Sister   . Diabetes Brother    History  Substance Use Topics  . Smoking status: Current Every Day Smoker -- 0.50 packs/day for 20 years    Types: Cigarettes  . Smokeless tobacco: Never Used  . Alcohol Use: No   OB History    Gravida Para Term Preterm AB TAB SAB Ectopic Multiple Living   5 5             Review of Systems  Constitutional: Negative for fever and diaphoresis.  HENT: Negative for congestion and sore throat.   Eyes: Negative.   Respiratory: Positive for chest tightness, shortness of  breath and wheezing.   Cardiovascular: Positive for leg swelling. Negative for chest pain and palpitations.  Gastrointestinal: Negative for nausea, vomiting and abdominal pain.  Genitourinary: Negative.   Musculoskeletal: Negative for joint swelling, arthralgias and neck pain.  Skin: Negative.  Negative for rash and wound.  Neurological: Positive for weakness. Negative for dizziness, light-headedness, numbness and headaches.  Psychiatric/Behavioral: Negative.       Allergies  Bactrim and Prednisone  Home Medications   Prior to Admission medications   Medication Sig Start Date End Date Taking? Authorizing Provider  albuterol (PROVENTIL HFA;VENTOLIN HFA) 108 (90 BASE) MCG/ACT inhaler Inhale 2  puffs into the lungs every 4 (four) hours as needed for wheezing or shortness of breath. 05/15/14  Yes Lily Kocher, PA-C  amLODipine (NORVASC) 5 MG tablet Take 5 mg by mouth daily. 11/23/14  Yes Historical Provider, MD  brimonidine-timolol (COMBIGAN) 0.2-0.5 % ophthalmic solution Place 1 drop into the right eye every 12 (twelve) hours.    Yes Historical Provider, MD  cetirizine (ZYRTEC) 10 MG tablet Take 10 mg by mouth daily.   Yes Historical Provider, MD  citalopram (CELEXA) 20 MG tablet Take 20 mg by mouth daily. 10/20/14  Yes Historical Provider, MD  furosemide (LASIX) 20 MG tablet Take 1 tablet by mouth daily. 12/14/14  Yes Historical Provider, MD  gabapentin (NEURONTIN) 300 MG capsule Take 600 mg by mouth 3 (three) times daily.    Yes Historical Provider, MD  HUMALOG KWIKPEN 100 UNIT/ML KiwkPen Inject 5 Units into the skin 4 (four) times daily -  with meals and at bedtime. 12/13/14  Yes Historical Provider, MD  hydrALAZINE (APRESOLINE) 25 MG tablet Take 25 mg by mouth 3 (three) times daily.    Yes Historical Provider, MD  insulin glargine (LANTUS) 100 UNIT/ML injection Inject 15 Units into the skin at bedtime.   Yes Historical Provider, MD  pravastatin (PRAVACHOL) 40 MG tablet Take 40 mg by mouth daily. 10/15/14  Yes Historical Provider, MD  sodium bicarbonate 650 MG tablet Take 650 mg by mouth daily.   Yes Historical Provider, MD  guaiFENesin-codeine 100-10 MG/5ML syrup Take 10 mLs by mouth every 6 (six) hours as needed for cough. Patient not taking: Reported on 11/19/2014 09/05/14   Veryl Speak, MD  Phenyleph-Promethazine-Cod 5-6.25-10 MG/5ML SYRP 87m po q6h prn cough/congestion Patient not taking: Reported on 11/19/2014 05/15/14   HLily Kocher PA-C   BP 180/88 mmHg  Pulse 83  Temp(Src) 98 F (36.7 C) (Oral)  Resp 18  Ht 5' 3"  (1.6 m)  Wt 188 lb 9.6 oz (85.548 kg)  BMI 33.42 kg/m2  SpO2 97%  LMP 12/21/2014 Physical Exam  Constitutional: She appears well-developed and well-nourished.   HENT:  Head: Normocephalic and atraumatic.  Eyes: Conjunctivae are normal.  Neck: Normal range of motion.  Cardiovascular: Normal rate, regular rhythm, normal heart sounds and intact distal pulses.   Pulmonary/Chest: Effort normal. No stridor. She has wheezes. She has no rales. She exhibits tenderness.  Occasional faint expiratory wheeze at right base.  Breath sounds are reduced bilateral bases, right greater than left  Abdominal: Soft. Bowel sounds are normal. There is no tenderness.  Musculoskeletal: Normal range of motion. She exhibits edema.  Bilateral pitting edema to mid tibia.  Neurological: She is alert.  Skin: Skin is warm and dry.  Psychiatric: She has a normal mood and affect.  Nursing note and vitals reviewed.   ED Course  Procedures (including critical care time) Labs Review Labs Reviewed  CBC WITH DIFFERENTIAL/PLATELET -  Abnormal; Notable for the following:    WBC 10.6 (*)    RBC 3.80 (*)    Hemoglobin 10.8 (*)    HCT 33.7 (*)    RDW 16.2 (*)    Neutro Abs 8.0 (*)    All other components within normal limits  COMPREHENSIVE METABOLIC PANEL - Abnormal; Notable for the following:    CO2 21 (*)    Glucose, Bld 206 (*)    BUN 37 (*)    Creatinine, Ser 3.14 (*)    Calcium 8.5 (*)    Albumin 3.1 (*)    GFR calc non Af Amer 17 (*)    GFR calc Af Amer 19 (*)    All other components within normal limits  BRAIN NATRIURETIC PEPTIDE - Abnormal; Notable for the following:    B Natriuretic Peptide 437.0 (*)    All other components within normal limits  BLOOD GAS, ARTERIAL - Abnormal; Notable for the following:    pH, Arterial 7.33 (*)    pO2, Arterial 31.9 (*)    Acid-base deficit 4.0 (*)    All other components within normal limits  URINALYSIS, ROUTINE W REFLEX MICROSCOPIC (NOT AT [facility]) - Abnormal; Notable for the following:    Color, Urine STRAW (*)    Glucose, UA 250 (*)    Hgb urine dipstick SMALL (*)    Protein, ur 100 (*)    All other components within normal  limits  GLUCOSE, CAPILLARY - Abnormal; Notable for the following:    Glucose-Capillary 218 (*)    All other components within normal limits  TROPONIN I  TROPONIN I  URINE MICROSCOPIC-ADD ON  TSH  HEMOGLOBIN A1C  VITAMIN B12  FOLATE  IRON AND TIBC  FERRITIN  TROPONIN I  TROPONIN I  CBC  BASIC METABOLIC PANEL  POC OCCULT BLOOD, ED    Imaging Review Dg Chest 2 View  01/21/2015   CLINICAL DATA:  44 year old female with shortness of breath, worsened over the past week.  EXAM: CHEST  2 VIEW  COMPARISON:  Chest radiograph dated 09/05/2014  FINDINGS: Two-view of the chest demonstrates increase interstitial markings concerning for a degree of pulmonary edema. Pneumonia is not excluded. No focal consolidation. There is blunting of the costophrenic angle on the lateral projection indicative of a small pleural effusion. Stable cardiac silhouette. The osseous structures are unremarkable for the patient's age.  IMPRESSION: Increased interstitial markings with a trace pleural effusion. Clinical correlation and follow-up recommended.   Electronically Signed   By: Anner Crete M.D.   On: 01/21/2015 12:16   US Renal  01/21/2015   CLINICAL DATA:  Chronic renal disease.  EXAM: RENAL / URINARY TRACT ULTRASOUND COMPLETE  COMPARISON:  CT 12/07/2014 .  FINDINGS: Right Kidney:  Length: 9.6 cm. Mild right renal cortical thinning . Echogenicity within normal limits. No mass or hydronephrosis visualized.  Left Kidney:  Length: 11.2 cm. Echogenicity within normal limits. No mass or hydronephrosis visualized.  Bladder:  Foley catheter in bladder.  Bladder nondistended.  Moderate size right pleural effusion.  IMPRESSION: 1. Mild right renal cortical thinning. Kidneys otherwise unremarkable.  2.  Moderate size right pleural effusion.   Electronically Signed   By: Marcello Moores  Register   On: 01/21/2015 17:02     EKG Interpretation   Date/Time:  Friday January 21 2015 11:15:27 EDT Ventricular Rate:  80 PR Interval:   147 QRS Duration: 85 QT Interval:  407 QTC Calculation: 469 R Axis:   91 Text Interpretation:  Right  and left arm electrode reversal,  interpretation assumes no reversal Sinus rhythm Probable anterior infarct,  old No significant change since last tracing Confirmed by [facility]  MD,  CHRISTOPHER [phone]) on 01/21/2015 11:41:54 AM      MDM   Final diagnoses:  Acute congestive heart failure, unspecified congestive heart failure type  Chronic renal insufficiency  Patients labs and/or radiological studies were reviewed and considered during the medical decision making and disposition process.  Results were also discussed with patient. Patient with presentation of CHF.  She was hypoxic to 85% on room air, she was placed on 4 L O2 with oxygen saturation to 92%.   Pt was also seen by Dr Betsey Holiday during this visit. Pt to be admitted for further diuresis and management of acute chf.  Discussed with Dr. Caryn Section who accepts pt for admission.  Temp admission orders placed.      Evalee Jefferson, PA-C 01/21/15 [place], MD 01/24/15 [phone]

## 2015-01-21 NOTE — H&P (Addendum)
[facility] History and Physical  [place] [facility] FSF:[id number] DOB: 08-02-1970 DOA: 01/21/2015  Referring physician: ED PA, [place] PCP: Kara Burly, MD   Chief Complaint: Shortness of breath  HPI: Kara Knapp is a 44 y.o. female with a history of insulin-dependent diabetes mellitus with diabetic retinopathy/neuropathy/nephropathy, essential hypertension, chronic bronchitis, and recent hospitalization at [facility]. She presents today with a complaint of shortness of breath and worsening swelling in her legs. She endorses orthopnea and PND. She has had a nonproductive cough. She denies chest pain or pleurisy or palpitations. She was recently started on Lasix approximately 4 weeks ago during a hospitalization at [place]. Per her account, she was not told that she had congestive heart failure. Records were obtained from [place] and they were reviewed. Accordingly, the patient was admitted on 12/09/14 for acute blood loss anemia thought to be secondary to her uterine fibroids. She was transfused 2 units of packed red blood cells. She was also found to have acute diastolic congestive heart failure and acute on chronic kidney injury. Since her discharge several weeks ago, she has had progressive shortness of breath and swelling in her legs and her ankles. She denies missing any doses of Lasix.  In the ED, she is afebrile and hypertensive with a blood pressure of 165/85. Her initial oxygen saturation was 85% on room air. Her EKG reveals normal sinus rhythm with a heart rate of 80 bpm and no concerning ST or T-wave abnormalities. Her chest x-ray reveals increased interstitial markings concerning for pulmonary edema and with a trace of pleural effusions. Her lab data are significant for a creatinine of 3.14, CO2 of 21, glucose of 206, normal troponin I, proBNP of 437, WBC of 10.6, and hemoglobin of 10.8. She is being admitted for further evaluation and  management.     Review of Systems:  Review of systems is positive as above in history present illness. In addition, she has difficulty seeing from her cataracts and retinopathy; otherwise review of systems is negative.  Past Medical History  Diagnosis Date  . Insulin-dependent diabetes mellitus with retinopathy   . Neuropathy   . Asthma   . Hypertension   . Osteomyelitis     left middle toe   . Cataract   . Bronchitis   . Pneumonia   . Depression   . Neuropathy due to secondary diabetes mellitus   . Hyperlipidemia   . Fibroids   . Anemia of chronic disease   . Chronic kidney disease     Stage III-IV  . CHF (congestive heart failure)     Presumed diastolic  . Cholecystitis, acute 05/26/2013    Status post cholecystectomy  . Tobacco abuse    Past Surgical History  Procedure Laterality Date  . Cesarean section    . Tubal ligation    . Cholecystectomy N/A 05/25/2013    Procedure: LAPAROSCOPIC CHOLECYSTECTOMY;  Surgeon: Kara So, MD;  Location: [facility];  Service: General;  Laterality: N/A;  . Eye surgery    . Pars plana vitrectomy Left 11/24/2014    Procedure: PARS PLANA VITRECTOMY WITH 25 GAUGE;  Surgeon: [place]ara Horn, MD;  Location: [place];  Service: Ophthalmology;  Laterality: Left;  . Photocoagulation with laser Left 11/24/2014    Procedure: PHOTOCOAGULATION WITH LASER;  Surgeon: [place]ara Horn, MD;  Location: [facility];  Service: Ophthalmology;  Laterality: Left;  with insertion of silicone oil   Social History: She is separated. She lives in [place]. Her  cousin, [place]ara [place] lives with her and helps to take care of her. Patient has 5 children. She is pending disability. She smokes half a pack of cigarettes per day. She denies illicit drug use or alcohol use.   Allergies  Allergen Reactions  . Bactrim [Sulfamethoxazole-Trimethoprim] Nausea And Vomiting  . Prednisone     "I was wide open and couldn't eat" per pt.     Family History  Problem Relation Age of Onset  . COPD  Mother   . Cancer Father   . Diabetes Sister   . Diabetes Brother      Prior to Admission medications   Medication Sig Start Date End Date Taking? Authorizing Provider  albuterol (PROVENTIL HFA;VENTOLIN HFA) 108 (90 BASE) MCG/ACT inhaler Inhale 2 puffs into the lungs every 4 (four) hours as needed for wheezing or shortness of breath. 05/15/14  Yes Kara Kocher, PA-C  amLODipine (NORVASC) 5 MG tablet Take 5 mg by mouth daily. 11/23/14  Yes Historical Provider, MD  brimonidine-timolol (COMBIGAN) 0.2-0.5 % ophthalmic solution Place 1 drop into the right eye every 12 (twelve) hours.    Yes Historical Provider, MD  cetirizine (ZYRTEC) 10 MG tablet Take 10 mg by mouth daily.   Yes Historical Provider, MD  citalopram (CELEXA) 20 MG tablet Take 20 mg by mouth daily. 10/20/14  Yes Historical Provider, MD  furosemide (LASIX) 20 MG tablet Take 1 tablet by mouth daily. 12/14/14  Yes Historical Provider, MD  gabapentin (NEURONTIN) 300 MG capsule Take 600 mg by mouth 3 (three) times daily.    Yes Historical Provider, MD  HUMALOG KWIKPEN 100 UNIT/ML KiwkPen Inject 5 Units into the skin 4 (four) times daily -  with meals and at bedtime. 12/13/14  Yes Historical Provider, MD  hydrALAZINE (APRESOLINE) 25 MG tablet Take 25 mg by mouth 3 (three) times daily.    Yes Historical Provider, MD  insulin glargine (LANTUS) 100 UNIT/ML injection Inject 15 Units into the skin at bedtime.   Yes Historical Provider, MD  pravastatin (PRAVACHOL) 40 MG tablet Take 40 mg by mouth daily. 10/15/14  Yes Historical Provider, MD  sodium bicarbonate 650 MG tablet Take 650 mg by mouth daily.   Yes Historical Provider, MD  guaiFENesin-codeine 100-10 MG/5ML syrup Take 10 mLs by mouth every 6 (six) hours as needed for cough. Patient not taking: Reported on 11/19/2014 09/05/14   Kara Speak, MD  Phenyleph-Promethazine-Cod 5-6.25-10 MG/5ML SYRP 73m po q6h prn cough/congestion Patient not taking: Reported on 11/19/2014 05/15/14   HLily Kocher  PA-C   Physical Exam: Filed Vitals:   01/21/15 1230 01/21/15 1300 01/21/15 1430 01/21/15 1500  BP: 195/98 179/87 174/93 168/85  Pulse: 87 88 87 88  Temp:      TempSrc:      Resp: 35 26 22 22   Height:      Weight:      SpO2: 88% 90% 96% 96%    Wt Readings from Last 3 Encounters:  01/21/15 86.183 kg (190 lb)  12/21/14 80.74 kg (178 lb)  11/24/14 81.194 kg (179 lb)    General:  Appears calm and comfortable; 44year old Caucasian woman in no acute distress. Eyes: PERRL, normal lids, irises & conjunctiva; conjunctivae are clear and sclerae are white. ENT: Oropharynx reveals moist because membranes. Multiple missing teeth. Mucous membranes are moist. Hearing is grossly within normal limits. Neck: no LAD, masses or thyromegaly; no significant JVD. Cardiovascular: S1, S2, no murmurs rubs or gallops. Lower extremities with 2+ pitting bilateral lower extremity edema. Pedal  pulses palpable. Telemetry: SR, no arrhythmias  Respiratory: Bilateral mid lobe to by basilar crackles; breathing nonlabored. Abdomen: Positive bowel sounds, soft, nontender, nondistended. Skin: no rash or induration seen on limited exam Musculoskeletal: grossly normal tone BUE/BLE; no acute hot red joints. Psychiatric: grossly normal mood and affect, speech fluent and appropriate Neurologic: She is alert and oriented 3. Cranial nerves II through XII are grossly intact with exception of a global decrease in visual acuity. Strength is 5 over 5 symmetrically and throughout.           Labs on Admission:  Basic Metabolic Panel:  Recent Labs Lab 01/21/15 1136  NA 138  K 5.0  CL 108  CO2 21*  GLUCOSE 206*  BUN 37*  CREATININE 3.14*  CALCIUM 8.5*   Liver Function Tests:  Recent Labs Lab 01/21/15 1136  AST 16  ALT 19  ALKPHOS 94  BILITOT 0.5  PROT 6.9  ALBUMIN 3.1*   No results for input(s): LIPASE, AMYLASE in the last 168 hours. No results for input(s): AMMONIA in the last 168 hours. CBC:  Recent  Labs Lab 01/21/15 1136  WBC 10.6*  NEUTROABS 8.0*  HGB 10.8*  HCT 33.7*  MCV 88.7  PLT 337   Cardiac Enzymes:  Recent Labs Lab 01/21/15 1136  TROPONINI <0.03    BNP (last 3 results)  Recent Labs  01/21/15 1139  BNP 437.0*    ProBNP (last 3 results) No results for input(s): PROBNP in the last 8760 hours.  CBG: No results for input(s): GLUCAP in the last 168 hours.  Radiological Exams on Admission: Dg Chest 2 View  01/21/2015   CLINICAL DATA:  44 year old female with shortness of breath, worsened over the past week.  EXAM: CHEST  2 VIEW  COMPARISON:  Chest radiograph dated 09/05/2014  FINDINGS: Two-view of the chest demonstrates increase interstitial markings concerning for a degree of pulmonary edema. Pneumonia is not excluded. No focal consolidation. There is blunting of the costophrenic angle on the lateral projection indicative of a small pleural effusion. Stable cardiac silhouette. The osseous structures are unremarkable for the patient's age.  IMPRESSION: Increased interstitial markings with a trace pleural effusion. Clinical correlation and follow-up recommended.   Electronically Signed   By: Anner Crete M.D.   On: 01/21/2015 12:16    EKG: Independently reviewed. Normal sinus rhythm, heart rate 80 bpm; no concerning ST or T-wave abdomen.  Assessment/Plan Principal Problem:   Acute on chronic diastolic heart failure Active Problems:   Essential hypertension   Acute respiratory failure with hypoxia   Insulin-dependent diabetes mellitus with retinopathy   Diabetic neuropathy   Chronic kidney disease, stage IV (severe)   Anemia of chronic disease   Tobacco abuse   1. Acute on chronic diastolic heart failure. The patient was apparently diagnosed in May during her previous hospitalization at [facility]. Per review of the [facility] records, the echocardiogram showed an EF of 65%, moderate LVH, and apparent normal ventricular diastolic filling. Therefore,  the heart failure is presumed diastolic. Given her chronic kidney disease, the fluid overload could be secondary to it. She was discharged from that hospital on Lasix 20 mg twice a day. She denied missing any doses. During her evaluation in the ED, her chest x-ray revealed mild interstitial markings consistent with edema and a trace of pleural effusions. Her BNP was modestly elevated at 437. She was given 80 mg of Lasix. We'll continue diuretic therapy with 40 mg every 12 hours. Will order a Foley catheter for  strict ins and outs. Will order daily weights. For further evaluation, will assess her TSH. Will cycle cardiac enzymes. 2. Acute respiratory failure with hypoxia. Her oxygen saturation on room air was 85%. The blood gas results had mixed venous and arterial blood and therefore was not diagnostic. Her hypoxia is likely secondary to decompensated heart failure. Will continue oxygen therapy as started in the ED. 3. Essential hypertension. The patient is moderately hypertensive in the ED. She is treated chronically with hydralazine and Norvasc. Both of these antihypertensives medications can cause an increase in body edema. Therefore, Norvasc will be discontinued and Coreg will be started. 4. Stage III to stage IV chronic kidney disease. In April 2016, her creatinine was 2.89. At [facility] in May, it was 3.57. On admission today, it is 3.14. We will order a renal ultrasound and consider inpatient nephrology consult versus outpatient referral as the patient has not established care with a local nephrologist yet. It is likely she has hypertensive nephrosclerosis or diabetic nephropathy. We'll monitor her renal function closely in the setting of diuretic therapy. 5. Insulin-dependent diabetes mellitus with retinopathy, neuropathy, and nephropathy. The patient is treated chronically with 3 times a day dosing of Humalog and once daily dosing of Lantus. We'll continue Lantus. Will start sliding scale  NovoLog. Will order hemoglobin A1c for further evaluation. 6. Normocytic anemia. Her hemoglobin is 10.8. Per review of the records for [facility], in May, she presented with a hemoglobin of 6.5. This was presumed to be secondary to uterine fibroid bleeding and chronic kidney disease. She was transfused 2 units of packed red blood cells at that time. We'll continue to monitor her. We'll order an anemia panel. 7. Tobacco abuse. The patient was advised to stop smoking. Tobacco cessation counseling was ordered. We'll place a nicotine patch.      Code Status: Full code DVT Prophylaxis: Lovenox Family Communication: Discussed plan with cousin and partial caretaker [place]. Disposition Plan: Discharge home when clinically appropriate.  Time spent: one hour.  [facility] Pager [phone]

## 2015-01-21 NOTE — ED Notes (Signed)
JAlfonzo Feller, PA-C notified of PO2 via [facility] and that tech reported it was a mixed sample.  Dr. Betsey Holiday in to try for IV site on patient via [place].

## 2015-01-21 NOTE — ED Provider Notes (Signed)
Patient presented to the ER with shortness of breath. Patient does have a history of chronic lung disease, possibly COPD as well as recently diagnosed CHF. Patient is not experiencing any active chest pain.  Face to face Exam: HEENT - PERRLA Lungs - decreased bilaterally, increased respiratory rate Heart - RRR, no M/R/G Abd - S/NT/ND Neuro - alert, oriented x3  Plan: Presents to the ER for evaluation of shortness of breath. Patient has a history of lung disease as well as congestive heart failure. Workup today supports diagnosis of CHF. Will require IV diuresis. Patient has a history of chronic renal failure, records obtained from [facility] for one month ago revealed that creatinine is at or below baseline.  Angiocath insertion Performed by: Orpah Greek  Consent: Verbal consent obtained. Risks and benefits: risks, benefits and alternatives were discussed Time out: Immediately prior to procedure a "time out" was called to verify the correct patient, procedure, equipment, support staff and site/side marked as required.  Preparation: Patient was prepped and draped in the usual sterile fashion.  Vein Location: left antecub  Ultrasound Guided  Gauge: 20G  Normal blood return and flush without difficulty Patient tolerance: Patient tolerated the procedure well with no immediate complications.    EKG Interpretation  Date/Time:  Friday January 21 2015 11:15:27 EDT Ventricular Rate:  80 PR Interval:  147 QRS Duration: 85 QT Interval:  407 QTC Calculation: 469 R Axis:   91 Text Interpretation:  Right and left arm electrode reversal, interpretation assumes no reversal Sinus rhythm Probable anterior infarct, old No significant change since last tracing Confirmed by Kelten Enochs  MD, Athalia Setterlund [phone]) on 01/21/2015 11:41:54 AM        Orpah Greek, MD 01/21/15 1355

## 2015-01-21 NOTE — ED Notes (Signed)
Patient complaining of shortness of breath x 1-2 weeks.

## 2015-01-22 LAB — BASIC METABOLIC PANEL
ANION GAP: 5 (ref 5–15)
BUN: 39 mg/dL — AB (ref 6–20)
CHLORIDE: 109 mmol/L (ref 101–111)
CO2: 22 mmol/L (ref 22–32)
Calcium: 7.8 mg/dL — ABNORMAL LOW (ref 8.9–10.3)
Creatinine, Ser: 3.23 mg/dL — ABNORMAL HIGH (ref 0.44–1.00)
GFR, EST AFRICAN AMERICAN: 19 mL/min — AB (ref >60)
GFR, EST NON AFRICAN AMERICAN: 16 mL/min — AB (ref >60)
Glucose, Bld: 110 mg/dL — ABNORMAL HIGH (ref 65–99)
POTASSIUM: 4.7 mmol/L (ref 3.5–5.1)
Sodium: 136 mmol/L (ref 135–145)

## 2015-01-22 LAB — GLUCOSE, CAPILLARY
GLUCOSE-CAPILLARY: 188 mg/dL — AB (ref 65–99)
GLUCOSE-CAPILLARY: 121 mg/dL — AB (ref 65–99)
Glucose-Capillary: 100 mg/dL — ABNORMAL HIGH (ref 65–99)
Glucose-Capillary: 183 mg/dL — ABNORMAL HIGH (ref 65–99)

## 2015-01-22 LAB — CBC
HEMATOCRIT: 27.4 % — AB (ref 36.0–46.0)
HEMOGLOBIN: 9 g/dL — AB (ref 12.0–15.0)
MCH: 28.9 pg (ref 26.0–34.0)
MCHC: 32.8 g/dL (ref 30.0–36.0)
MCV: 88.1 fL (ref 78.0–100.0)
Platelets: 279 10*3/uL (ref 150–400)
RBC: 3.11 MIL/uL — ABNORMAL LOW (ref 3.87–5.11)
RDW: 16.2 % — AB (ref 11.5–15.5)
WBC: 8.8 10*3/uL (ref 4.0–10.5)

## 2015-01-22 LAB — HEMOGLOBIN A1C
Hgb A1c MFr Bld: 10.1 % — ABNORMAL HIGH (ref 4.8–5.6)
MEAN PLASMA GLUCOSE: 243 mg/dL

## 2015-01-22 LAB — TROPONIN I

## 2015-01-22 MED ORDER — TIMOLOL MALEATE 0.5 % OP SOLN
1.0000 [drp] | Freq: Two times a day (BID) | OPHTHALMIC | Status: DC
  Administered 2015-01-22 – 2015-01-23 (×2): 1 [drp] via OPHTHALMIC

## 2015-01-22 MED ORDER — BRIMONIDINE TARTRATE 0.2 % OP SOLN
1.0000 [drp] | Freq: Two times a day (BID) | OPHTHALMIC | Status: DC
  Administered 2015-01-22 – 2015-01-23 (×2): 1 [drp] via OPHTHALMIC

## 2015-01-22 NOTE — Progress Notes (Signed)
[facility] PROGRESS NOTE  Kara Knapp QJF:[id number] DOB: [birth year] DOA: 01/21/2015 PCP: Neale Burly, MD    Code Status: Full code Family Communication: Discussed with patient; family not available Disposition Plan: Discharge when clinically appropriate, likely in the next 24-48 hours   Consultants:  None  Procedures:  None  Antibiotics:  None  HPI/Subjective: Patient says that she is breathing a little better. She denies chest pain. She denies nausea, vomiting, or diarrhea. She denies pain with urination.  Objective: Filed Vitals:   01/22/15 0851  BP: 146/69  Pulse: 72  Temp:   Resp:    temperature 98.5. Respiratory rate 17. Oxygen saturation 92%.  Intake/Output Summary (Last 24 hours) at 01/22/15 1228 Last data filed at 01/22/15 8937  Gross per 24 hour  Intake 931.33 ml  Output   3275 ml  Net -2343.67 ml   Filed Weights   01/21/15 1055 01/21/15 1549 01/22/15 0500  Weight: 86.183 kg (190 lb) 85.548 kg (188 lb 9.6 oz) 84.55 kg (186 lb 6.4 oz)    Exam:   General:  [name] in no acute distress.  Cardiovascular: S1, S2, with a soft systolic murmur.  Respiratory: Few crackles auscultated in the bases; clear in the upper lobes.  Abdomen: Positive bowel sounds, soft, nontender, nondistended.  Musculoskeletal/extremities: 1+ bilateral lower extremity pitting edema, decreased from yesterday   Neurologic: She is alert and oriented 3. Her speech is clear.  Data Reviewed: Basic Metabolic Panel:  Recent Labs Lab 01/21/15 1136 01/22/15 0248  NA 138 136  K 5.0 4.7  CL 108 109  CO2 21* 22  GLUCOSE 206* 110*  BUN 37* 39*  CREATININE 3.14* 3.23*  CALCIUM 8.5* 7.8*   Liver Function Tests:  Recent Labs Lab 01/21/15 1136  AST 16  ALT 19  ALKPHOS 94  BILITOT 0.5  PROT 6.9  ALBUMIN 3.1*   No results for input(s): LIPASE, AMYLASE in the last 168 hours. No results for input(s): AMMONIA in the last 168  hours. CBC:  Recent Labs Lab 01/21/15 1136 01/22/15 0248  WBC 10.6* 8.8  NEUTROABS 8.0*  --   HGB 10.8* 9.0*  HCT 33.7* 27.4*  MCV 88.7 88.1  PLT 337 279   Cardiac Enzymes:  Recent Labs Lab 01/21/15 1136 01/21/15 1625 01/21/15 2129 01/22/15 0248  TROPONINI <0.03 <0.03 <0.03 <0.03   BNP (last 3 results)  Recent Labs  01/21/15 1139  BNP 437.0*    ProBNP (last 3 results) No results for input(s): PROBNP in the last 8760 hours.  CBG:  Recent Labs Lab 01/21/15 1626 01/21/15 2054 01/22/15 0749 01/22/15 1133  GLUCAP 218* 282* 100* 188*    No results found for this or any previous visit (from the past 240 hour(s)).   Studies: Dg Chest 2 View  01/21/2015   CLINICAL DATA:  44 year old female with shortness of breath, worsened over the past week.  EXAM: CHEST  2 VIEW  COMPARISON:  Chest radiograph dated 09/05/2014  FINDINGS: Two-view of the chest demonstrates increase interstitial markings concerning for a degree of pulmonary edema. Pneumonia is not excluded. No focal consolidation. There is blunting of the costophrenic angle on the lateral projection indicative of a small pleural effusion. Stable cardiac silhouette. The osseous structures are unremarkable for the patient's age.  IMPRESSION: Increased interstitial markings with a trace pleural effusion. Clinical correlation and follow-up recommended.   Electronically Signed   By: Anner Crete M.D.   On: 01/21/2015 12:16   US Renal  01/21/2015  CLINICAL DATA:  Chronic renal disease.  EXAM: RENAL / URINARY TRACT ULTRASOUND COMPLETE  COMPARISON:  CT 12/07/2014 .  FINDINGS: Right Kidney:  Length: 9.6 cm. Mild right renal cortical thinning . Echogenicity within normal limits. No mass or hydronephrosis visualized.  Left Kidney:  Length: 11.2 cm. Echogenicity within normal limits. No mass or hydronephrosis visualized.  Bladder:  Foley catheter in bladder.  Bladder nondistended.  Moderate size right pleural effusion.   IMPRESSION: 1. Mild right renal cortical thinning. Kidneys otherwise unremarkable.  2.  Moderate size right pleural effusion.   Electronically Signed   By: Marcello Moores  Register   On: 01/21/2015 17:02    Scheduled Meds: . brimonidine  1 drop Right Eye Q12H   And  . timolol  1 drop Right Eye Q12H  . carvedilol  3.125 mg Oral BID WC  . citalopram  20 mg Oral Daily  . enoxaparin (LOVENOX) injection  30 mg Subcutaneous Q24H  . furosemide  40 mg Intravenous Q12H  . gabapentin  600 mg Oral TID  . hydrALAZINE  25 mg Oral TID  . insulin aspart  0-5 Units Subcutaneous QHS  . insulin aspart  0-9 Units Subcutaneous TID WC  . insulin glargine  15 Units Subcutaneous QHS  . loratadine  10 mg Oral Daily  . nicotine  14 mg Transdermal Daily  . pravastatin  40 mg Oral Daily  . sodium bicarbonate  650 mg Oral Daily   Continuous Infusions: . sodium chloride 10 mL/hr at 01/21/15 1719   Assessment and plan:  Principal Problem:   Acute on chronic diastolic heart failure Active Problems:   Essential hypertension   Acute respiratory failure with hypoxia   Insulin-dependent diabetes mellitus with retinopathy   Diabetic neuropathy   Chronic kidney disease, stage IV (severe)   Anemia of chronic disease   Tobacco abuse  Acute on chronic diastolic heart failure.  The patient was apparently diagnosed in May during her previous hospitalization at [facility]. Per review of the [facility] records, the echocardiogram showed an EF of 65%, moderate LVH, and apparent normal ventricular diastolic filling. Therefore, the heart failure is presumed diastolic. Given her chronic kidney disease, the fluid overload could be secondary to it. She was discharged from that hospital on Lasix 20 mg twice a day. She denied missing any doses. During her evaluation in the ED, her chest x-ray revealed mild interstitial markings consistent with edema and a trace of pleural effusions. Her BNP was modestly elevated at 437. She was  given 80 mg of Lasix. -Following admission, Foley catheter was placed and she was continued on diuretic therapy with Lasix IV 40 mg every 12 hours. Her in/out's is -2.3 L so far. -Her troponin I was negative 3. Her TSH was marginally elevated at 4.6. Would recommend outpatient monitoring.  -Continue current management.  Acute respiratory failure with hypoxia.  Her oxygen saturation on room air was 85% in the ED. The blood gas results had mixed venous and arterial blood and therefore was not diagnostic. -We'll continue oxygen therapy. Will assess her daily for home oxygen requirement if needed. - Her hypoxia is likely secondary to decompensated heart failure.   Essential hypertension.  The patient was moderately hypertensive in the ED. She was treated chronically with hydralazine and Norvasc. Both of these antihypertensives medications can cause an increase in body edema. Therefore, Norvasc was discontinued and Coreg will be started. Her blood pressure has improved. We'll continue to adjust antihypertensives medications as needed.  Stage III to  stage IV chronic kidney disease.  In April 2016, her creatinine was 2.89. At [facility] in May, it was 3.57. On admission, it was 3.14.  As above, she was started on IV Lasix. Renal ultrasound was ordered and revealed mild right renal cortical thinning and moderate right side pleural effusion. Her urine output is nonoliguric.  It is likely she has hypertensive nephrosclerosis or diabetic nephropathy. Will consider inpatient nephrology consultation if her renal function worsens significantly, otherwise, will refer her for outpatient management.  Insulin-dependent diabetes mellitus with retinopathy, neuropathy, and nephropathy.  The patient is treated chronically with 3 times a day dosing of Humalog and once daily dosing of Lantus.  She was started on Lantus and sliding scale NovoLog during the hospitalization. Her blood glucose has been controlled so  far. Her hemoglobin A1c was 10.1, indicating suboptimal outpatient control. Compliance may be an issue, but patient denies missing doses of her insulin.  Normocytic anemia.  Her hemoglobin is 10.8. Per review of the records for [facility], in May, she presented with a hemoglobin of 6.5. This was presumed to be secondary to uterine fibroid bleeding and chronic kidney disease. She was transfused 2 units of packed red blood cells at that time. -Anemia studies ordered and revealed vitamin B12 of 305, folate of 15.9, total iron of 44, and ferritin of 51.  Tobacco abuse.  The patient was advised to stop smoking. Tobacco cessation counseling was ordered. Nicotine patch was placed.    Time spent: 35 minutes.    [facility] Pager [phone]. If 7PM-7AM, please contact night-coverage at [email or web address], password [facility] 01/22/2015, 12:28 PM  LOS: 1 day

## 2015-01-22 NOTE — Progress Notes (Signed)
Utilization review Completed Carlisle Beers RN BSN

## 2015-01-23 ENCOUNTER — Encounter (HOSPITAL_COMMUNITY): Payer: Self-pay | Admitting: Internal Medicine

## 2015-01-23 LAB — CBC
HCT: 28.9 % — ABNORMAL LOW (ref 36.0–46.0)
Hemoglobin: 9.2 g/dL — ABNORMAL LOW (ref 12.0–15.0)
MCH: 28.7 pg (ref 26.0–34.0)
MCHC: 31.8 g/dL (ref 30.0–36.0)
MCV: 90 fL (ref 78.0–100.0)
Platelets: 298 10*3/uL (ref 150–400)
RBC: 3.21 MIL/uL — ABNORMAL LOW (ref 3.87–5.11)
RDW: 15.6 % — AB (ref 11.5–15.5)
WBC: 8 10*3/uL (ref 4.0–10.5)

## 2015-01-23 LAB — RENAL FUNCTION PANEL
Albumin: 2.4 g/dL — ABNORMAL LOW (ref 3.5–5.0)
Anion gap: 7 (ref 5–15)
BUN: 40 mg/dL — AB (ref 6–20)
CALCIUM: 8.1 mg/dL — AB (ref 8.9–10.3)
CO2: 22 mmol/L (ref 22–32)
Chloride: 107 mmol/L (ref 101–111)
Creatinine, Ser: 3.41 mg/dL — ABNORMAL HIGH (ref 0.44–1.00)
GFR calc non Af Amer: 15 mL/min — ABNORMAL LOW (ref >60)
GFR, EST AFRICAN AMERICAN: 18 mL/min — AB (ref >60)
Glucose, Bld: 127 mg/dL — ABNORMAL HIGH (ref 65–99)
Phosphorus: 4.8 mg/dL — ABNORMAL HIGH (ref 2.5–4.6)
Potassium: 4.6 mmol/L (ref 3.5–5.1)
Sodium: 136 mmol/L (ref 135–145)

## 2015-01-23 LAB — GLUCOSE, CAPILLARY
GLUCOSE-CAPILLARY: 211 mg/dL — AB (ref 65–99)
Glucose-Capillary: 119 mg/dL — ABNORMAL HIGH (ref 65–99)

## 2015-01-23 MED ORDER — FUROSEMIDE 80 MG PO TABS
80.0000 mg | ORAL_TABLET | Freq: Every day | ORAL | Status: DC
Start: 2015-01-23 — End: 2015-01-23

## 2015-01-23 MED ORDER — CARVEDILOL 3.125 MG PO TABS: 3.1250 mg | ORAL_TABLET | Freq: Two times a day (BID) | ORAL | Status: DC

## 2015-01-23 MED ORDER — FUROSEMIDE 40 MG PO TABS: 40.0000 mg | ORAL_TABLET | Freq: Two times a day (BID) | ORAL | Status: DC

## 2015-01-23 MED ORDER — NEPHRO-VITE RX 1 MG PO TABS: 1.0000 | ORAL_TABLET | Freq: Every day | ORAL | Status: DC

## 2015-01-23 MED ORDER — FUROSEMIDE 40 MG PO TABS
40.0000 mg | ORAL_TABLET | Freq: Two times a day (BID) | ORAL | Status: DC
  Administered 2015-01-23: 40 mg via ORAL
  Filled 2015-01-23: qty 1

## 2015-01-23 NOTE — Discharge Summary (Signed)
Physician Discharge Summary  Kara Knapp VXB:[id number] DOB: 1971-01-30 DOA: 01/21/2015  PCP: Neale Burly, MD  Admit date: 01/21/2015 Discharge date: 01/23/2015  Time spent: Greater than 30 minutes  Recommendations for Outpatient Follow-up:  1. Patient will be referred to outpatient nephrology for further evaluation and management.  2. Recommend follow-up renal function test in the next 1-2 weeks. 3. Recommend close follow-up management of the patient's hypertension and diabetes.    Discharge Diagnoses:  1. Acute on chronic diastolic congestive heart failure. -In/out -4.4 L. Weight on admission 190 pounds. Weight at the time of discharge 184 pounds. 2. Acute respiratory failure with hypoxia secondary to CHF. Resolved. 3. Progressive stage stage IV chronic kidney disease. -Creatinine at discharge was 3.41. 4. Uncontrolled diabetes mellitus with retinopathy, neuropathy, and nephropathy. -Patient's hemoglobin A1c was 10.1. 5. Essential hypertension. 6. Anemia of chronic disease/chronic kidney disease. 7. Tobacco abuse. The patient was advised to stop smoking. 8. Uterine fibroids.   Discharge Condition: Improved.  Diet recommendation: Heart healthy/carbohydrate modified.  Filed Weights   01/21/15 1549 01/22/15 0500 01/23/15 0629  Weight: 85.548 kg (188 lb 9.6 oz) 84.55 kg (186 lb 6.4 oz) 83.89 kg (184 lb 15.1 oz)    History of present illness:  Kara Knapp is a 44 y.o. female with a history of insulin-dependent diabetes mellitus with diabetic retinopathy/neuropathy/nephropathy, essential hypertension, chronic bronchitis, and recent hospitalization at [facility]. She presented with a complaint of shortness of breath and worsening swelling in her legs. She endorsed orthopnea and PND. She had a nonproductive cough. She denied chest pain or pleurisy or palpitations. She was recently started on Lasix approximately 4 weeks ago during a hospitalization at [facility]. Per her account, she was not told that she had congestive heart failure. Records were obtained from [facility] and they were reviewed. Accordingly, the patient was admitted on 12/09/14 for acute blood loss anemia thought to be secondary to her uterine fibroids. She was transfused 2 units of packed red blood cells. She was also found to have acute diastolic congestive heart failure and acute on chronic kidney injury. Since her discharge several weeks ago, she has had progressive shortness of breath and swelling in her legs and her ankles. She denied missing any doses of Lasix.  In the ED, she was afebrile and hypertensive with a blood pressure of 165/85. Her initial oxygen saturation was 85% on room air. Her EKG revealed normal sinus rhythm with a heart rate of 80 bpm and no concerning ST or T-wave abnormalities. Her chest x-ray revealed increased interstitial markings concerning for pulmonary edema and with a trace of pleural effusions. Her lab data were significant for a creatinine of 3.14, CO2 of 21, glucose of 206, normal troponin I, proBNP of 437, WBC of 10.6, and hemoglobin of 10.8. She was admitted for further evaluation and management.  Hospital Course:   Acute on chronic diastolic heart failure.  The patient was apparently diagnosed in May during her previous hospitalization at [facility]. Per review of the [facility] records, the echocardiogram showed an EF of 65%, moderate LVH, and apparent normal ventricular diastolic filling. Therefore, the heart failure was presumed diastolic. She was discharged from [facility] on Lasix 20 mg twice a day. She denied missing any doses. During the evaluation in the ED, her chest x-ray revealed mild interstitial markings consistent with edema and a trace of pleural effusions. Her BNP was modestly elevated at 437. She was given 80 mg of Lasix in the ED, and  then subsequently started on 40 mg IV every 12 hours. Her TSH was assessed and was  marginally elevated at 4.6, not clinically diagnostic of hypothyroidism. She diuresed well. At the time of hospital discharge, she was -4.4 L and had less pulmonary crackles and peripheral edema. She was discharged on 40 mg Lasix twice a day. This dosing may need to be titrated up or down depending on her pulmonary exam and/or renal function.  -Of note, although the patient was presumed to have decompensated diastolic heart failure, her progressive chronic kidney disease could've also played a role.  Acute respiratory failure with hypoxia. Her oxygen saturation on room air was 85% in the ED. The blood gas results had mixed venous and arterial blood and therefore was not diagnostic. She was continued on oxygen therapy. She was diuresed as above. Her oxygen saturation improved and she was oxygenating 92-95% on room air at rest and with activity.  Essential hypertension.  The patient was moderately hypertensive in the ED. She was treated chronically with hydralazine and Norvasc. Both of these antihypertensives medications can cause an increase in body edema. Therefore, Norvasc was discontinued and Coreg was started instead. Her blood pressure had improved some, but was not optimal. She was discharged on Coreg and hydralazine. Doses of both could be titrated up in the outpatient setting as appropriate.  Stage III to stage IV chronic kidney disease.  In April 2016, her creatinine was 2.89. At [facility] in May, it was 3.57. On admission, it was 3.14.  As above, she was started on IV Lasix. Renal ultrasound was ordered and revealed mild right renal cortical thinning and moderate right side pleural effusion. Her urine output was nonoliguric. It is likely she has hypertensive nephrosclerosis or diabetic nephropathy.  Her creatinine was 3.41 at the time of discharge. She will be referred to nephrology for outpatient management.  Insulin-dependent diabetes mellitus with retinopathy, neuropathy,  and nephropathy.  The patient is treated chronically with 3 times a day dosing of Humalog and once daily dosing of Lantus.  She was started on Lantus and sliding scale NovoLog during the hospitalization. Her blood glucose was controlled during the hospitalization, ranging from 110 and 130. Her hemoglobin A1c was 10.1, indicating suboptimal outpatient control. Compliance may be an issue, but patient denied missing doses of her insulin. The decision was made not to increase the insulin dosing upon discharge as her blood glucose was relatively normal on sliding scale NovoLog and Lantus during hospitalization.. Further management will be deferred to her PCP.  Normocytic anemia. Her hemoglobin is 10.8. Per review of the records for [facility], in May, she presented with a hemoglobin of 6.5. This was presumed to be secondary to uterine fibroid bleeding and chronic kidney disease. She was transfused 2 units of packed red blood cells at that time. -Anemia studies ordered during this hospitalization and revealed vitamin B12 of 305, folate of 15.9, total iron of 44, and ferritin of 51. She was discharged on Nephro-Vite daily.  Tobacco abuse.  The patient was advised to stop smoking. Tobacco cessation counseling was ordered. Nicotine patch was placed.   Procedures:  None  Consultations:  None  Discharge Exam: Filed Vitals:   01/23/15 0817  BP: 169/93  Pulse: 69  Temp:   Resp:    temperature 98.2. Respiratory rate 18. Oxygen saturation 93% on room air.  General: [name] in no acute distress.  Cardiovascular: S1, S2, with a soft systolic murmur.  Respiratory: Occasional crackles auscultated in the  bases; clear in the upper lobes.  Abdomen: Positive bowel sounds, soft, nontender, nondistended.  Musculoskeletal/extremities: 1+ bilateral lower extremity pitting edema, decreased from yesterday   Neurologic: She is alert and oriented 3. Her speech is  clear.   Discharge Instructions   Discharge Instructions    Diet - low sodium heart healthy    Complete by:  As directed      Diet general    Complete by:  As directed      Discharge instructions    Complete by:  As directed   Take medications as prescribed. Do not take Humalog insulin if your blood sugar is less than 120. Avoid eating salty foods. Avoid adding salt at the table.     Increase activity slowly    Complete by:  As directed           Current Discharge Medication List    START taking these medications   Details  B Complex-C-Folic Acid (B COMPLEX-VITAMIN C-FOLIC ACID) 1 MG tablet Take 1 tablet by mouth daily with breakfast. Qty: 30 tablet, Refills: 6    carvedilol (COREG) 3.125 MG tablet Take 1 tablet (3.125 mg total) by mouth 2 (two) times daily with a meal. Blood pressure medication. Qty: 60 tablet, Refills: 3      CONTINUE these medications which have CHANGED   Details  furosemide (LASIX) 40 MG tablet Take 1 tablet (40 mg total) by mouth 2 (two) times daily. Qty: 60 tablet, Refills: 3      CONTINUE these medications which have NOT CHANGED   Details  albuterol (PROVENTIL HFA;VENTOLIN HFA) 108 (90 BASE) MCG/ACT inhaler Inhale 2 puffs into the lungs every 4 (four) hours as needed for wheezing or shortness of breath. Qty: 1 Inhaler, Refills: 1    brimonidine-timolol (COMBIGAN) 0.2-0.5 % ophthalmic solution Place 1 drop into the right eye every 12 (twelve) hours.     cetirizine (ZYRTEC) 10 MG tablet Take 10 mg by mouth daily.    citalopram (CELEXA) 20 MG tablet Take 20 mg by mouth daily. Refills: 2    gabapentin (NEURONTIN) 300 MG capsule Take 600 mg by mouth 3 (three) times daily.     HUMALOG KWIKPEN 100 UNIT/ML KiwkPen Inject 5 Units into the skin 4 (four) times daily -  with meals and at bedtime. Refills: 0    hydrALAZINE (APRESOLINE) 25 MG tablet Take 25 mg by mouth 3 (three) times daily.     insulin glargine (LANTUS) 100 UNIT/ML injection Inject 15  Units into the skin at bedtime.    pravastatin (PRAVACHOL) 40 MG tablet Take 40 mg by mouth daily. Refills: 0    sodium bicarbonate 650 MG tablet Take 650 mg by mouth daily.      STOP taking these medications     amLODipine (NORVASC) 5 MG tablet      guaiFENesin-codeine 100-10 MG/5ML syrup      Phenyleph-Promethazine-Cod 5-6.25-10 MG/5ML SYRP        Allergies  Allergen Reactions  . Bactrim [Sulfamethoxazole-Trimethoprim] Nausea And Vomiting  . Prednisone     "I was wide open and couldn't eat" per pt.    Follow-up Information    Schedule an appointment as soon as possible for a visit with HASANAJ,XAJE A, MD.   Specialty:  Internal Medicine   Why:  Follow-up in 1-2 weeks.   Contact information:   [place] [address] 694 [phone]       Follow up with [facility], MD.   Specialty:  Nephrology   Why:  This is a kidney doctor. We'll try to arrange an outpatient follow-up with him.   Contact information:   [address] [place] [address] [phone]        The results of significant diagnostics from this hospitalization (including imaging, microbiology, ancillary and laboratory) are listed below for reference.    Significant Diagnostic Studies: Dg Chest 2 View  01/21/2015   CLINICAL DATA:  44 year old female with shortness of breath, worsened over the past week.  EXAM: CHEST  2 VIEW  COMPARISON:  Chest radiograph dated 09/05/2014  FINDINGS: Two-view of the chest demonstrates increase interstitial markings concerning for a degree of pulmonary edema. Pneumonia is not excluded. No focal consolidation. There is blunting of the costophrenic angle on the lateral projection indicative of a small pleural effusion. Stable cardiac silhouette. The osseous structures are unremarkable for the patient's age.  IMPRESSION: Increased interstitial markings with a trace pleural effusion. Clinical correlation and follow-up recommended.   Electronically  Signed   By: Anner Crete M.D.   On: 01/21/2015 12:16   US Renal  01/21/2015   CLINICAL DATA:  Chronic renal disease.  EXAM: RENAL / URINARY TRACT ULTRASOUND COMPLETE  COMPARISON:  CT 12/07/2014 .  FINDINGS: Right Kidney:  Length: 9.6 cm. Mild right renal cortical thinning . Echogenicity within normal limits. No mass or hydronephrosis visualized.  Left Kidney:  Length: 11.2 cm. Echogenicity within normal limits. No mass or hydronephrosis visualized.  Bladder:  Foley catheter in bladder.  Bladder nondistended.  Moderate size right pleural effusion.  IMPRESSION: 1. Mild right renal cortical thinning. Kidneys otherwise unremarkable.  2.  Moderate size right pleural effusion.   Electronically Signed   By: Marcello Moores  Register   On: 01/21/2015 17:02    Microbiology: No results found for this or any previous visit (from the past 240 hour(s)).   Labs: Basic Metabolic Panel:  Recent Labs Lab 01/21/15 1136 01/22/15 0248 01/23/15 0530  NA 138 136 136  K 5.0 4.7 4.6  CL 108 109 107  CO2 21* 22 22  GLUCOSE 206* 110* 127*  BUN 37* 39* 40*  CREATININE 3.14* 3.23* 3.41*  CALCIUM 8.5* 7.8* 8.1*  PHOS  --   --  4.8*   Liver Function Tests:  Recent Labs Lab 01/21/15 1136 01/23/15 0530  AST 16  --   ALT 19  --   ALKPHOS 94  --   BILITOT 0.5  --   PROT 6.9  --   ALBUMIN 3.1* 2.4*   No results for input(s): LIPASE, AMYLASE in the last 168 hours. No results for input(s): AMMONIA in the last 168 hours. CBC:  Recent Labs Lab 01/21/15 1136 01/22/15 0248 01/23/15 0530  WBC 10.6* 8.8 8.0  NEUTROABS 8.0*  --   --   HGB 10.8* 9.0* 9.2*  HCT 33.7* 27.4* 28.9*  MCV 88.7 88.1 90.0  PLT 337 279 298   Cardiac Enzymes:  Recent Labs Lab 01/21/15 1136 01/21/15 1625 01/21/15 2129 01/22/15 0248  TROPONINI <0.03 <0.03 <0.03 <0.03   BNP: BNP (last 3 results)  Recent Labs  01/21/15 1139  BNP 437.0*    ProBNP (last 3 results) No results for input(s): PROBNP in the last 8760  hours.  CBG:  Recent Labs Lab 01/22/15 0749 01/22/15 1133 01/22/15 1617 01/22/15 2022 01/23/15 0752  GLUCAP 100* 188* 121* 183* 119*       Signed:  Jaymie Misch  [facility] 01/23/2015, 11:36 AM

## 2015-01-23 NOTE — Progress Notes (Signed)
IV catheter removed and intact. IV site clean dry and intact. Discharge instructions, medications, and follow up appointments reviewed and discussed with patient. All questions answered and no further questions at this time. Pt in stable condition and in no acute distress at this time. Pt escorted by nurse tech.

## 2015-01-24 ENCOUNTER — Ambulatory Visit (INDEPENDENT_AMBULATORY_CARE_PROVIDER_SITE_OTHER): Payer: Medicaid Other | Admitting: Obstetrics and Gynecology

## 2015-01-24 ENCOUNTER — Encounter: Payer: Self-pay | Admitting: Obstetrics and Gynecology

## 2015-01-24 ENCOUNTER — Other Ambulatory Visit (HOSPITAL_COMMUNITY)
Admission: RE | Admit: 2015-01-24 | Discharge: 2015-01-24 | Disposition: A | Discharge status: Home / Self Care | Payer: Medicaid Other | Source: Ambulatory Visit | Attending: Obstetrics and Gynecology | Admitting: Obstetrics and Gynecology

## 2015-01-24 VITALS — BP 120/78 | Ht 63.0 in | Wt 180.5 lb

## 2015-01-24 DIAGNOSIS — Z1151 Encounter for screening for human papillomavirus (HPV): Secondary | ICD-10-CM | POA: Insufficient documentation

## 2015-01-24 DIAGNOSIS — D500 Iron deficiency anemia secondary to blood loss (chronic): Secondary | ICD-10-CM

## 2015-01-24 DIAGNOSIS — N921 Excessive and frequent menstruation with irregular cycle: Secondary | ICD-10-CM | POA: Diagnosis not present

## 2015-01-24 DIAGNOSIS — Z01419 Encounter for gynecological examination (general) (routine) without abnormal findings: Secondary | ICD-10-CM | POA: Diagnosis present

## 2015-01-24 DIAGNOSIS — D259 Leiomyoma of uterus, unspecified: Secondary | ICD-10-CM | POA: Diagnosis not present

## 2015-01-24 DIAGNOSIS — Z124 Encounter for screening for malignant neoplasm of cervix: Secondary | ICD-10-CM

## 2015-01-24 NOTE — Addendum Note (Signed)
Addended by: Farley Ly on: 01/24/2015 11:30 AM   Modules accepted: Orders

## 2015-01-24 NOTE — Progress Notes (Signed)
Patient ID: Kara Knapp, female   DOB: Apr 24, 1971, 44 y.o.   MRN: [id number] Pt sent here today for uterine fibroids.

## 2015-01-24 NOTE — Progress Notes (Signed)
Patient ID: Kara Knapp, female   DOB: Dec 27, 1970, 44 y.o.   MRN: [id number]   [facility] Visit  Patient name: Kara Knapp MRN [id number]  Date of birth: 1971/03/06 Referral by Dr Jaquita Folds CC & HPI:  Kara Knapp is a 44 y.o. female with a history of HTN, DM, and stage III-IV CKD, presenting today for uterine fibroids, sent by Dr. Sherrie Sport. She was unsure what imaging she has had, but she states she had a battery of tests done at [facility] last month.   Patient complains of irregular menses that are heavy for 4-5 days. She uses about 6-7 pads a day and reports blood clots. Her periods occur monthly. Patient states she is anemic from the blood loss.  Patient states her last pap smear occurred over 5 years ago. She states she has been sexually active in the last month, and reports 2 episodes of intercourse without protection with the same partner.   Patient was discharged 2 days ago after a hospital appointment, during which she stayed for 2 days. She denies being on dialysis.   Patient checked her CBG this morning, which was at about 180. She checks her CBG TID before every meal and before bed. Patient has a history of cataracts. She states she has complete vision loss in her left eye due to DM.   Patient's application for disability is still pending. Her son and roommate help her manage her health issues.  She denies any leaking from her rectum.   ROS:  A complete review of systems was obtained and all systems are negative except as noted in the HPI and PMH.    Pertinent History Reviewed:   Reviewed: Significant for tubal ligation, Cesarean section Medical         Past Medical History  Diagnosis Date  . Insulin-dependent diabetes mellitus with retinopathy   . Neuropathy   . Asthma   . Hypertension   . Osteomyelitis     left middle toe   . Cataract   . Bronchitis   . Pneumonia   . Depression   . Neuropathy due to secondary diabetes mellitus   .  Hyperlipidemia   . Fibroids   . Anemia of chronic disease   . Chronic kidney disease     Stage III-IV  . CHF (congestive heart failure)     Presumed diastolic  . Cholecystitis, acute 05/26/2013    Status post cholecystectomy  . Tobacco abuse   . Fibroid, uterine   . CHF (congestive heart failure)   . Kidney disease                               Surgical Hx:    Past Surgical History  Procedure Laterality Date  . Cesarean section    . Tubal ligation    . Cholecystectomy N/A 05/25/2013    Procedure: LAPAROSCOPIC CHOLECYSTECTOMY;  Surgeon: Jamesetta So, MD;  Location: [facility];  Service: General;  Laterality: N/A;  . Eye surgery    . Pars plana vitrectomy Left 11/24/2014    Procedure: PARS PLANA VITRECTOMY WITH 25 GAUGE;  Surgeon: Hurman Horn, MD;  Location: [place];  Service: Ophthalmology;  Laterality: Left;  . Photocoagulation with laser Left 11/24/2014    Procedure: PHOTOCOAGULATION WITH LASER;  Surgeon: Hurman Horn, MD;  Location: [facility];  Service: Ophthalmology;  Laterality: Left;  with insertion of silicone oil  Medications: Reviewed & Updated - see associated section                       Current outpatient prescriptions:  .  ACCU-CHEK AVIVA PLUS test strip, USE TO TEST BLOOD SUGAR THREE TIMES DAILY., Disp: , Rfl: 11 .  albuterol (PROVENTIL HFA;VENTOLIN HFA) 108 (90 BASE) MCG/ACT inhaler, Inhale 2 puffs into the lungs every 4 (four) hours as needed for wheezing or shortness of breath., Disp: 1 Inhaler, Rfl: 1 .  brimonidine-timolol (COMBIGAN) 0.2-0.5 % ophthalmic solution, Place 1 drop into the right eye every 12 (twelve) hours. , Disp: , Rfl:  .  cetirizine (ZYRTEC) 10 MG tablet, Take 10 mg by mouth daily., Disp: , Rfl:  .  citalopram (CELEXA) 20 MG tablet, Take 20 mg by mouth daily., Disp: , Rfl: 2 .  furosemide (LASIX) 40 MG tablet, Take 1 tablet (40 mg total) by mouth 2 (two) times daily., Disp: 60 tablet, Rfl: 3 .  gabapentin (NEURONTIN) 300 MG capsule, Take 600 mg by  mouth 3 (three) times daily. , Disp: , Rfl:  .  HUMALOG KWIKPEN 100 UNIT/ML KiwkPen, Inject 5 Units into the skin 4 (four) times daily -  with meals and at bedtime., Disp: , Rfl: 0 .  hydrALAZINE (APRESOLINE) 25 MG tablet, Take 25 mg by mouth 3 (three) times daily. , Disp: , Rfl:  .  LANTUS SOLOSTAR 100 UNIT/ML Solostar Pen, INJECT 15 UNITS AT BEDTIME, Disp: , Rfl: 1 .  pravastatin (PRAVACHOL) 40 MG tablet, Take 40 mg by mouth daily., Disp: , Rfl: 0 .  simvastatin (ZOCOR) 20 MG tablet, Take 20 mg by mouth every evening., Disp: , Rfl: 0 .  sodium bicarbonate 650 MG tablet, Take 650 mg by mouth daily., Disp: , Rfl:  .  timolol (TIMOPTIC) 0.5 % ophthalmic solution, INSTILL ONE DROP IN THE LEFT EYE DAILY., Disp: , Rfl: 6 .  B Complex-C-Folic Acid (B COMPLEX-VITAMIN C-FOLIC ACID) 1 MG tablet, Take 1 tablet by mouth daily with breakfast. (Patient not taking: Reported on 01/24/2015), Disp: 30 tablet, Rfl: 6 .  carvedilol (COREG) 3.125 MG tablet, Take 1 tablet (3.125 mg total) by mouth 2 (two) times daily with a meal. Blood pressure medication. (Patient not taking: Reported on 01/24/2015), Disp: 60 tablet, Rfl: 3   Social History: Reviewed -  reports that she has been smoking Cigarettes.  She has a 10 pack-year smoking history. She has never used smokeless tobacco.  Objective Findings:  Vitals: Blood pressure 120/78, height 5' 3"  (1.6 m), weight 180 lb 8 oz (81.874 kg), last menstrual period 12/21/2014.  Physical Examination: General appearance - alert, well appearing, and in no distress, oriented to person, place, and time and overweight Mental status - alert, oriented to person, place, and time, normal mood, behavior, speech, dress, motor activity, and thought processes, affect appropriate to mood Pelvic - normal external genitalia, vulva, vagina, cervix, uterus and adnexa,  VULVA: normal appearing vulva with no masses, tenderness or lesions,  VAGINA: normal appearing vagina with normal color and  discharge, no lesions,  CERVIX: normal appearing cervix without discharge or lesions, multiparous UTERUS: uterus is slightly enlarged, ?8 weeks size,  Difficult to feel, ADNEXA: normal adnexa in size, nontender and no masses,    Assessment & Plan:   A:  1. Menorrhagia.  2. Uterine fibroids, as reported by pt from PCP. No imaging results for this are on file.   P: Pap . Gc/chl done  1. Discussed IUD to lighten periods.  2. Schedule u/s for further evaluation.   This chart was SCRIBED for Mallory Shirk, MD by Stephania Fragmin, ED Scribe. This patient was seen in room 1 and the patient's care was started at 11:04 AM.  I personally performed the services described in this documentation, which was SCRIBED in my presence. The recorded information has been reviewed and considered accurate. It has been edited as necessary during review. Jonnie Kind, MD

## 2015-01-26 LAB — CYTOLOGY - PAP

## 2015-01-29 ENCOUNTER — Encounter (HOSPITAL_COMMUNITY): Payer: Self-pay | Admitting: Emergency Medicine

## 2015-01-29 ENCOUNTER — Emergency Department (HOSPITAL_COMMUNITY): Payer: Medicaid Other

## 2015-01-29 ENCOUNTER — Emergency Department (HOSPITAL_COMMUNITY)
Admission: EM | Admit: 2015-01-29 | Discharge: 2015-01-29 | Disposition: A | Discharge status: Home / Self Care | Payer: Medicaid Other | Attending: Emergency Medicine | Admitting: Emergency Medicine

## 2015-01-29 DIAGNOSIS — N184 Chronic kidney disease, stage 4 (severe): Secondary | ICD-10-CM | POA: Diagnosis not present

## 2015-01-29 DIAGNOSIS — Z794 Long term (current) use of insulin: Secondary | ICD-10-CM | POA: Diagnosis not present

## 2015-01-29 DIAGNOSIS — Z8719 Personal history of other diseases of the digestive system: Secondary | ICD-10-CM | POA: Diagnosis not present

## 2015-01-29 DIAGNOSIS — Z862 Personal history of diseases of the blood and blood-forming organs and certain disorders involving the immune mechanism: Secondary | ICD-10-CM | POA: Insufficient documentation

## 2015-01-29 DIAGNOSIS — L03119 Cellulitis of unspecified part of limb: Secondary | ICD-10-CM

## 2015-01-29 DIAGNOSIS — E1340 Other specified diabetes mellitus with diabetic neuropathy, unspecified: Secondary | ICD-10-CM | POA: Insufficient documentation

## 2015-01-29 DIAGNOSIS — F329 Major depressive disorder, single episode, unspecified: Secondary | ICD-10-CM | POA: Insufficient documentation

## 2015-01-29 DIAGNOSIS — E785 Hyperlipidemia, unspecified: Secondary | ICD-10-CM | POA: Diagnosis not present

## 2015-01-29 DIAGNOSIS — R609 Edema, unspecified: Secondary | ICD-10-CM

## 2015-01-29 DIAGNOSIS — I509 Heart failure, unspecified: Secondary | ICD-10-CM | POA: Diagnosis not present

## 2015-01-29 DIAGNOSIS — Z8739 Personal history of other diseases of the musculoskeletal system and connective tissue: Secondary | ICD-10-CM | POA: Diagnosis not present

## 2015-01-29 DIAGNOSIS — Z86018 Personal history of other benign neoplasm: Secondary | ICD-10-CM | POA: Diagnosis not present

## 2015-01-29 DIAGNOSIS — J45909 Unspecified asthma, uncomplicated: Secondary | ICD-10-CM | POA: Diagnosis not present

## 2015-01-29 DIAGNOSIS — L03115 Cellulitis of right lower limb: Secondary | ICD-10-CM | POA: Insufficient documentation

## 2015-01-29 DIAGNOSIS — Z8669 Personal history of other diseases of the nervous system and sense organs: Secondary | ICD-10-CM | POA: Insufficient documentation

## 2015-01-29 DIAGNOSIS — Z8701 Personal history of pneumonia (recurrent): Secondary | ICD-10-CM | POA: Insufficient documentation

## 2015-01-29 DIAGNOSIS — I129 Hypertensive chronic kidney disease with stage 1 through stage 4 chronic kidney disease, or unspecified chronic kidney disease: Secondary | ICD-10-CM | POA: Diagnosis not present

## 2015-01-29 DIAGNOSIS — R2243 Localized swelling, mass and lump, lower limb, bilateral: Secondary | ICD-10-CM | POA: Diagnosis present

## 2015-01-29 LAB — CBC WITH DIFFERENTIAL/PLATELET
BASOS ABS: 0.1 10*3/uL (ref 0.0–0.1)
Basophils Relative: 1 % (ref 0–1)
EOS PCT: 2 % (ref 0–5)
Eosinophils Absolute: 0.2 10*3/uL (ref 0.0–0.7)
HCT: 27.5 % — ABNORMAL LOW (ref 36.0–46.0)
HEMOGLOBIN: 9 g/dL — AB (ref 12.0–15.0)
LYMPHS PCT: 17 % (ref 12–46)
Lymphs Abs: 1.7 10*3/uL (ref 0.7–4.0)
MCH: 28.8 pg (ref 26.0–34.0)
MCHC: 32.7 g/dL (ref 30.0–36.0)
MCV: 87.9 fL (ref 78.0–100.0)
MONOS PCT: 8 % (ref 3–12)
Monocytes Absolute: 0.7 10*3/uL (ref 0.1–1.0)
NEUTROS ABS: 7 10*3/uL (ref 1.7–7.7)
Neutrophils Relative %: 72 % (ref 43–77)
PLATELETS: 305 10*3/uL (ref 150–400)
RBC: 3.13 MIL/uL — ABNORMAL LOW (ref 3.87–5.11)
RDW: 15.4 % (ref 11.5–15.5)
WBC: 9.7 10*3/uL (ref 4.0–10.5)

## 2015-01-29 LAB — COMPREHENSIVE METABOLIC PANEL
ALK PHOS: 99 U/L (ref 38–126)
ALT: 15 U/L (ref 14–54)
AST: 14 U/L — ABNORMAL LOW (ref 15–41)
Albumin: 2.5 g/dL — ABNORMAL LOW (ref 3.5–5.0)
Anion gap: 7 (ref 5–15)
BUN: 46 mg/dL — AB (ref 6–20)
CHLORIDE: 105 mmol/L (ref 101–111)
CO2: 22 mmol/L (ref 22–32)
Calcium: 8.1 mg/dL — ABNORMAL LOW (ref 8.9–10.3)
Creatinine, Ser: 3.56 mg/dL — ABNORMAL HIGH (ref 0.44–1.00)
GFR calc Af Amer: 17 mL/min — ABNORMAL LOW (ref >60)
GFR calc non Af Amer: 14 mL/min — ABNORMAL LOW (ref >60)
Glucose, Bld: 226 mg/dL — ABNORMAL HIGH (ref 65–99)
Potassium: 4.8 mmol/L (ref 3.5–5.1)
Sodium: 134 mmol/L — ABNORMAL LOW (ref 135–145)
Total Bilirubin: 0.4 mg/dL (ref 0.3–1.2)
Total Protein: 6 g/dL — ABNORMAL LOW (ref 6.5–8.1)

## 2015-01-29 MED ORDER — DOXYCYCLINE HYCLATE 100 MG PO CAPS: 100.0000 mg | ORAL_CAPSULE | Freq: Two times a day (BID) | ORAL | Status: DC

## 2015-01-29 NOTE — Discharge Instructions (Signed)
Take an extra lasix today and tomorrow.  Follow up next week for recheck

## 2015-01-29 NOTE — ED Provider Notes (Signed)
CSN: [id number]     Arrival date & time 01/29/15  1604 History   First MD Initiated Contact with Patient 01/29/15 1620     Chief Complaint  Patient presents with  . Leg Swelling     (Consider location/radiation/quality/duration/timing/severity/associated sxs/prior Treatment) Patient is a 44 y.o. female presenting with lower extremity pain. The history is provided by the patient (pt complains of swelling to both feet and rash to right foot).  Foot Pain This is a new problem. The current episode started yesterday. The problem occurs constantly. The problem has not changed since onset.Pertinent negatives include no chest pain, no abdominal pain and no headaches. Nothing aggravates the symptoms. Nothing relieves the symptoms.    Past Medical History  Diagnosis Date  . Insulin-dependent diabetes mellitus with retinopathy   . Neuropathy   . Asthma   . Hypertension   . Osteomyelitis     left middle toe   . Cataract   . Bronchitis   . Pneumonia   . Depression   . Neuropathy due to secondary diabetes mellitus   . Hyperlipidemia   . Fibroids   . Anemia of chronic disease   . Chronic kidney disease     Stage III-IV  . CHF (congestive heart failure)     Presumed diastolic  . Cholecystitis, acute 05/26/2013    Status post cholecystectomy  . Tobacco abuse   . Fibroid, uterine   . CHF (congestive heart failure)   . Kidney disease    Past Surgical History  Procedure Laterality Date  . Cesarean section    . Tubal ligation    . Cholecystectomy N/A 05/25/2013    Procedure: LAPAROSCOPIC CHOLECYSTECTOMY;  Surgeon: Jamesetta So, MD;  Location: [facility];  Service: General;  Laterality: N/A;  . Eye surgery    . Pars plana vitrectomy Left 11/24/2014    Procedure: PARS PLANA VITRECTOMY WITH 25 GAUGE;  Surgeon: Hurman Horn, MD;  Location: [place];  Service: Ophthalmology;  Laterality: Left;  . Photocoagulation with laser Left 11/24/2014    Procedure: PHOTOCOAGULATION WITH LASER;  Surgeon: Hurman Horn, MD;  Location: [place];  Service: Ophthalmology;  Laterality: Left;  with insertion of silicone oil   Family History  Problem Relation Age of Onset  . COPD Mother   . Cancer Father   . Diabetes Sister   . Diabetes Brother   . Mental retardation Sister    History  Substance Use Topics  . Smoking status: Current Every Day Smoker -- 0.50 packs/day for 20 years    Types: Cigarettes  . Smokeless tobacco: Never Used  . Alcohol Use: No   OB History    Gravida Para Term Preterm AB TAB SAB Ectopic Multiple Living   5 5             Review of Systems  Constitutional: Negative for appetite change and fatigue.  HENT: Negative for congestion, ear discharge and sinus pressure.   Eyes: Negative for discharge.  Respiratory: Negative for cough.   Cardiovascular: Negative for chest pain.  Gastrointestinal: Negative for abdominal pain and diarrhea.  Genitourinary: Negative for frequency and hematuria.  Musculoskeletal: Negative for back pain.       Bilateral lower extr. Edema.   Rash to right foot  Skin: Negative for rash.  Neurological: Negative for seizures and headaches.  Psychiatric/Behavioral: Negative for hallucinations.      Allergies  Bactrim and Prednisone  Home Medications   Prior to Admission medications   Medication  Sig Start Date End Date Taking? Authorizing Provider  B Complex-C-Folic Acid (B COMPLEX-VITAMIN C-FOLIC ACID) 1 MG tablet Take 1 tablet by mouth daily with breakfast. 01/23/15  Yes Rexene Alberts, MD  brimonidine-timolol (COMBIGAN) 0.2-0.5 % ophthalmic solution Place 1 drop into the right eye every 12 (twelve) hours.    Yes Historical Provider, MD  carvedilol (COREG) 3.125 MG tablet Take 1 tablet (3.125 mg total) by mouth 2 (two) times daily with a meal. Blood pressure medication. 01/23/15  Yes Rexene Alberts, MD  cetirizine (ZYRTEC) 10 MG tablet Take 10 mg by mouth daily.   Yes Historical Provider, MD  citalopram (CELEXA) 20 MG tablet Take 20 mg by mouth  daily. 10/20/14  Yes Historical Provider, MD  furosemide (LASIX) 40 MG tablet Take 1 tablet (40 mg total) by mouth 2 (two) times daily. 01/23/15  Yes Rexene Alberts, MD  gabapentin (NEURONTIN) 300 MG capsule Take 600 mg by mouth 3 (three) times daily.    Yes Historical Provider, MD  HUMALOG KWIKPEN 100 UNIT/ML KiwkPen Inject 5 Units into the skin 4 (four) times daily -  with meals and at bedtime. 12/13/14  Yes Historical Provider, MD  hydrALAZINE (APRESOLINE) 25 MG tablet Take 25 mg by mouth 3 (three) times daily.    Yes Historical Provider, MD  LANTUS SOLOSTAR 100 UNIT/ML Solostar Pen INJECT 15 UNITS AT BEDTIME 01/14/15  Yes Historical Provider, MD  pravastatin (PRAVACHOL) 40 MG tablet Take 40 mg by mouth daily. 10/15/14  Yes Historical Provider, MD  sodium bicarbonate 650 MG tablet Take 650 mg by mouth daily.   Yes Historical Provider, MD  timolol (TIMOPTIC) 0.5 % ophthalmic solution Instill 1 drop into your left eye as needed 12/13/14  Yes Historical Provider, MD  albuterol (PROVENTIL HFA;VENTOLIN HFA) 108 (90 BASE) MCG/ACT inhaler Inhale 2 puffs into the lungs every 4 (four) hours as needed for wheezing or shortness of breath. 05/15/14   Lily Kocher, PA-C  doxycycline (VIBRAMYCIN) 100 MG capsule Take 1 capsule (100 mg total) by mouth 2 (two) times daily. One po bid x 7 days 01/29/15   Milton Ferguson, MD   BP 135/67 mmHg  Pulse 76  Temp(Src) 98 F (36.7 C) (Oral)  Resp 16  Ht 5' 3"  (1.6 m)  Wt 180 lb (81.647 kg)  BMI 31.89 kg/m2  SpO2 95%  LMP 12/21/2014 Physical Exam  Constitutional: She is oriented to person, place, and time. She appears well-developed.  HENT:  Head: Normocephalic.  Eyes: Conjunctivae and EOM are normal. No scleral icterus.  Neck: Neck supple. No thyromegaly present.  Cardiovascular: Normal rate and regular rhythm.  Exam reveals no gallop and no friction rub.   No murmur heard. Pulmonary/Chest: No stridor. She has no wheezes. She has no rales. She exhibits no tenderness.   Abdominal: She exhibits no distension. There is no tenderness. There is no rebound.  Musculoskeletal: Normal range of motion. She exhibits edema.  Redness and swelling distal right foot.  Early cellulitus  Lymphadenopathy:    She has no cervical adenopathy.  Neurological: She is oriented to person, place, and time. She exhibits normal muscle tone. Coordination normal.  Skin: No rash noted. No erythema.  Psychiatric: She has a normal mood and affect. Her behavior is normal.    ED Course  Procedures (including critical care time) Labs Review Labs Reviewed  CBC WITH DIFFERENTIAL/PLATELET - Abnormal; Notable for the following:    RBC 3.13 (*)    Hemoglobin 9.0 (*)    HCT 27.5 (*)  All other components within normal limits  COMPREHENSIVE METABOLIC PANEL - Abnormal; Notable for the following:    Sodium 134 (*)    Glucose, Bld 226 (*)    BUN 46 (*)    Creatinine, Ser 3.56 (*)    Calcium 8.1 (*)    Total Protein 6.0 (*)    Albumin 2.5 (*)    AST 14 (*)    GFR calc non Af Amer 14 (*)    GFR calc Af Amer 17 (*)    All other components within normal limits    Imaging Review Dg Foot Complete Right  01/29/2015   CLINICAL DATA:  Right foot swelling and redness across the metatarsal area. No injury.  EXAM: RIGHT FOOT COMPLETE - 3+ VIEW  COMPARISON:  None.  FINDINGS: There is no evidence of fracture or dislocation. There is no evidence of arthropathy or other focal bone abnormality. There is soft tissue swelling of the mid to distal foot.  IMPRESSION: No acute bony abnormality. Soft tissue swelling of the mid to distal foot.   Electronically Signed   By: Abelardo Diesel M.D.   On: 01/29/2015 17:05     EKG Interpretation None      MDM   Final diagnoses:  Edema  Cellulitis of foot    Edema from chf and renal insuff.   Will increase lasix for 2 days.  Mild cellulitis right foot will start doxy.  Pt to follow up this week    Milton Ferguson, MD 01/29/15 [phone]

## 2015-01-29 NOTE — ED Notes (Signed)
Discharge instructions given, pt demonstrated teach back and verbal understanding. No concerns voiced.  

## 2015-01-29 NOTE — ED Notes (Signed)
Pt reports leg swelling x2 days. Pt reports was seen last week for same and admitted for heart/kidney failure. Pt denies cp,sob.

## 2015-02-01 ENCOUNTER — Telehealth: Payer: Self-pay | Admitting: Family Medicine

## 2015-02-01 NOTE — Telephone Encounter (Signed)
Stp and she is aware she will need to have [place] on her Medicaid card or we won't be able to see her. Pt is aware to arrive 30 minutes prior and that we don't do chronic pain management here at out office. Pt will bring her current list of medications with her. Pt given appt with Dr.stacks 8/25 at 1:10.

## 2015-02-07 ENCOUNTER — Encounter: Payer: Self-pay | Admitting: Obstetrics and Gynecology

## 2015-02-07 ENCOUNTER — Ambulatory Visit (INDEPENDENT_AMBULATORY_CARE_PROVIDER_SITE_OTHER): Payer: Medicaid Other | Admitting: Obstetrics and Gynecology

## 2015-02-07 VITALS — BP 120/86 | Ht 63.0 in | Wt 174.0 lb

## 2015-02-07 DIAGNOSIS — N912 Amenorrhea, unspecified: Secondary | ICD-10-CM

## 2015-02-07 DIAGNOSIS — N939 Abnormal uterine and vaginal bleeding, unspecified: Secondary | ICD-10-CM

## 2015-02-07 MED ORDER — MEDROXYPROGESTERONE ACETATE 10 MG PO TABS: 10.0000 mg | ORAL_TABLET | Freq: Every day | ORAL | Status: DC

## 2015-02-07 NOTE — Addendum Note (Signed)
Addended by: Jonnie Kind on: 02/07/2015 11:22 AM   Modules accepted: Orders

## 2015-02-07 NOTE — Progress Notes (Signed)
Patient ID: Kara Knapp, female   DOB: 06/27/1971, 44 y.o.   MRN: [id number]    [facility] Visit  Patient name: Kara Knapp MRN [id number]  Date of birth: 1970-11-09  CC & HPI:  Kara Knapp is a 44 y.o. female with a history of HTN, DM, and stage III-IV CKD presenting today for follow-up for discussion of pap results, menorrhagia and suspected fibroid uterus. Pt was initially seen  Primary care at [facility] in 11/2014 and was told upon physical exam she may have fibroids. She was last seen here on 6/27 for the same and complained of irregular, heavy menses that last 4-5 days. Her periods usually occur monthly, but she has not had menses in the last 2 months. The plan on 6/27 was to schedule a pelvic US and place an IUD.  ROS:  A complete 10 system review of systems was obtained and all systems are negative except as noted in the HPI and PMH.   Pertinent History Reviewed:   Reviewed: Significant for tubal ligation Medical         Past Medical History  Diagnosis Date  . Insulin-dependent diabetes mellitus with retinopathy   . Neuropathy   . Asthma   . Hypertension   . Osteomyelitis     left middle toe   . Cataract   . Bronchitis   . Pneumonia   . Depression   . Neuropathy due to secondary diabetes mellitus   . Hyperlipidemia   . Fibroids   . Anemia of chronic disease   . Chronic kidney disease     Stage III-IV  . CHF (congestive heart failure)     Presumed diastolic  . Cholecystitis, acute 05/26/2013    Status post cholecystectomy  . Tobacco abuse   . Fibroid, uterine   . CHF (congestive heart failure)   . Kidney disease                               Surgical Hx:    Past Surgical History  Procedure Laterality Date  . Cesarean section    . Tubal ligation    . Cholecystectomy N/A 05/25/2013    Procedure: LAPAROSCOPIC CHOLECYSTECTOMY;  Surgeon: Jamesetta So, MD;  Location: [facility];  Service: General;  Laterality: N/A;  . Eye surgery    .  Pars plana vitrectomy Left 11/24/2014    Procedure: PARS PLANA VITRECTOMY WITH 25 GAUGE;  Surgeon: Hurman Horn, MD;  Location: [place];  Service: Ophthalmology;  Laterality: Left;  . Photocoagulation with laser Left 11/24/2014    Procedure: PHOTOCOAGULATION WITH LASER;  Surgeon: Hurman Horn, MD;  Location: [place];  Service: Ophthalmology;  Laterality: Left;  with insertion of silicone oil   Medications: Reviewed & Updated - see associated section                       Current outpatient prescriptions:  .  albuterol (PROVENTIL HFA;VENTOLIN HFA) 108 (90 BASE) MCG/ACT inhaler, Inhale 2 puffs into the lungs every 4 (four) hours as needed for wheezing or shortness of breath., Disp: 1 Inhaler, Rfl: 1 .  B Complex-C-Folic Acid (B COMPLEX-VITAMIN C-FOLIC ACID) 1 MG tablet, Take 1 tablet by mouth daily with breakfast., Disp: 30 tablet, Rfl: 6 .  brimonidine-timolol (COMBIGAN) 0.2-0.5 % ophthalmic solution, Place 1 drop into the right eye every 12 (twelve) hours. , Disp: , Rfl:  .  carvedilol (COREG) 3.125 MG tablet, Take 1 tablet (3.125 mg total) by mouth 2 (two) times daily with a meal. Blood pressure medication., Disp: 60 tablet, Rfl: 3 .  cetirizine (ZYRTEC) 10 MG tablet, Take 10 mg by mouth daily., Disp: , Rfl:  .  citalopram (CELEXA) 20 MG tablet, Take 20 mg by mouth daily., Disp: , Rfl: 2 .  doxycycline (VIBRAMYCIN) 100 MG capsule, Take 1 capsule (100 mg total) by mouth 2 (two) times daily. One po bid x 7 days, Disp: 14 capsule, Rfl: 0 .  furosemide (LASIX) 40 MG tablet, Take 1 tablet (40 mg total) by mouth 2 (two) times daily., Disp: 60 tablet, Rfl: 3 .  gabapentin (NEURONTIN) 300 MG capsule, Take 600 mg by mouth 3 (three) times daily. , Disp: , Rfl:  .  HUMALOG KWIKPEN 100 UNIT/ML KiwkPen, Inject 5 Units into the skin 4 (four) times daily -  with meals and at bedtime., Disp: , Rfl: 0 .  hydrALAZINE (APRESOLINE) 25 MG tablet, Take 25 mg by mouth 3 (three) times daily. , Disp: , Rfl:  .  LANTUS  SOLOSTAR 100 UNIT/ML Solostar Pen, INJECT 15 UNITS AT BEDTIME, Disp: , Rfl: 1 .  pravastatin (PRAVACHOL) 40 MG tablet, Take 40 mg by mouth daily., Disp: , Rfl: 0 .  sodium bicarbonate 650 MG tablet, Take 650 mg by mouth daily., Disp: , Rfl:  .  timolol (TIMOPTIC) 0.5 % ophthalmic solution, Instill 1 drop into your left eye as needed, Disp: , Rfl: 6   Social History: Reviewed -  reports that she has been smoking Cigarettes.  She has a 10 pack-year smoking history. She has never used smokeless tobacco.  Objective Findings:  Vitals: Blood pressure 120/86, height 5' 3"  (1.6 m), weight 174 lb (78.926 kg), last menstrual period 12/21/2014.  Physical Examination: General appearance - alert, well appearing, and in no distress and oriented to person, place, and time Mental status - alert, oriented to person, place, and time, normal mood, behavior, speech, dress, motor activity, and thought processes Pelvic- normal external genitalia, normal cervix, normal secretions with light blood present  Discussion only 20 minutes  Assessment & Plan:   A:  1. Discussed pap smear results with pt which was normal and negative for HPV 2. Test gonorrhea and chlamydia  P:  1. Place IUD within this month after u/s confirmation of pelvic anatomy. 2. Rx provera to bring on menses 2. Order Pelvic US for I personally performed the services described in this documentation, which was SCRIBED in my presence. The recorded information has been reviewed and considered accurate. It has been edited as necessary during review. Jonnie Kind, MD     This chart was scribed for Jonnie Kind, MD by Tula Nakayama, Medical Scribe. This patient was seen in office and the patient's care was started at 10:46 AM.

## 2015-02-07 NOTE — Addendum Note (Signed)
Addended by: Farley Ly on: 02/07/2015 11:36 AM   Modules accepted: Orders

## 2015-02-07 NOTE — Progress Notes (Signed)
Patient ID: Kara Knapp, female   DOB: March 05, 1971, 44 y.o.   MRN: [id number] Pt here today for follow up.

## 2015-02-08 LAB — GC/CHLAMYDIA PROBE AMP
Chlamydia trachomatis, NAA: NEGATIVE
NEISSERIA GONORRHOEAE BY PCR: NEGATIVE

## 2015-02-09 ENCOUNTER — Other Ambulatory Visit: Payer: Self-pay | Admitting: Obstetrics and Gynecology

## 2015-02-09 DIAGNOSIS — N939 Abnormal uterine and vaginal bleeding, unspecified: Secondary | ICD-10-CM

## 2015-02-10 ENCOUNTER — Ambulatory Visit (INDEPENDENT_AMBULATORY_CARE_PROVIDER_SITE_OTHER): Payer: Medicaid Other

## 2015-02-10 DIAGNOSIS — N939 Abnormal uterine and vaginal bleeding, unspecified: Secondary | ICD-10-CM

## 2015-02-10 NOTE — Progress Notes (Signed)
US PELVIC TA/TV: anteverted,heterogenous uterus w/mult fibroids and mult.small echogenic foci w/in myometrium w/o shadowing, #1)fundal subserosal fibroid 6.3 x 5.6 x 4.4cm (#2) post fundal subserosal 3 x 3.6 x 4cm,normal ov's bilat(appear mobile),no pain during ultrasound

## 2015-02-14 ENCOUNTER — Telehealth: Payer: Self-pay | Admitting: Family Medicine

## 2015-02-15 ENCOUNTER — Ambulatory Visit: Payer: Medicaid Other | Admitting: Obstetrics and Gynecology

## 2015-02-15 NOTE — Telephone Encounter (Signed)
Appointment rescheduled to 8/2 with Dr. Wendi Snipes

## 2015-02-17 ENCOUNTER — Emergency Department (HOSPITAL_COMMUNITY)
Admission: EM | Admit: 2015-02-17 | Discharge: 2015-02-17 | Disposition: A | Discharge status: Home / Self Care | Payer: Medicaid Other | Attending: Physician Assistant | Admitting: Physician Assistant

## 2015-02-17 ENCOUNTER — Encounter (HOSPITAL_COMMUNITY): Payer: Self-pay | Admitting: Emergency Medicine

## 2015-02-17 ENCOUNTER — Emergency Department (HOSPITAL_COMMUNITY): Payer: Medicaid Other

## 2015-02-17 DIAGNOSIS — Z794 Long term (current) use of insulin: Secondary | ICD-10-CM | POA: Diagnosis not present

## 2015-02-17 DIAGNOSIS — I129 Hypertensive chronic kidney disease with stage 1 through stage 4 chronic kidney disease, or unspecified chronic kidney disease: Secondary | ICD-10-CM | POA: Insufficient documentation

## 2015-02-17 DIAGNOSIS — Y9289 Other specified places as the place of occurrence of the external cause: Secondary | ICD-10-CM | POA: Diagnosis not present

## 2015-02-17 DIAGNOSIS — E785 Hyperlipidemia, unspecified: Secondary | ICD-10-CM | POA: Insufficient documentation

## 2015-02-17 DIAGNOSIS — S299XXA Unspecified injury of thorax, initial encounter: Secondary | ICD-10-CM | POA: Insufficient documentation

## 2015-02-17 DIAGNOSIS — Z8739 Personal history of other diseases of the musculoskeletal system and connective tissue: Secondary | ICD-10-CM | POA: Insufficient documentation

## 2015-02-17 DIAGNOSIS — N184 Chronic kidney disease, stage 4 (severe): Secondary | ICD-10-CM | POA: Diagnosis not present

## 2015-02-17 DIAGNOSIS — R0781 Pleurodynia: Secondary | ICD-10-CM

## 2015-02-17 DIAGNOSIS — Z793 Long term (current) use of hormonal contraceptives: Secondary | ICD-10-CM | POA: Diagnosis not present

## 2015-02-17 DIAGNOSIS — Z720 Tobacco use: Secondary | ICD-10-CM | POA: Diagnosis not present

## 2015-02-17 DIAGNOSIS — R109 Unspecified abdominal pain: Secondary | ICD-10-CM | POA: Insufficient documentation

## 2015-02-17 DIAGNOSIS — Y998 Other external cause status: Secondary | ICD-10-CM | POA: Diagnosis not present

## 2015-02-17 DIAGNOSIS — Z8669 Personal history of other diseases of the nervous system and sense organs: Secondary | ICD-10-CM | POA: Diagnosis not present

## 2015-02-17 DIAGNOSIS — W1839XA Other fall on same level, initial encounter: Secondary | ICD-10-CM | POA: Diagnosis not present

## 2015-02-17 DIAGNOSIS — E1340 Other specified diabetes mellitus with diabetic neuropathy, unspecified: Secondary | ICD-10-CM | POA: Insufficient documentation

## 2015-02-17 DIAGNOSIS — R51 Headache: Secondary | ICD-10-CM | POA: Diagnosis not present

## 2015-02-17 DIAGNOSIS — F329 Major depressive disorder, single episode, unspecified: Secondary | ICD-10-CM | POA: Insufficient documentation

## 2015-02-17 DIAGNOSIS — J45909 Unspecified asthma, uncomplicated: Secondary | ICD-10-CM | POA: Insufficient documentation

## 2015-02-17 DIAGNOSIS — Y9389 Activity, other specified: Secondary | ICD-10-CM | POA: Diagnosis not present

## 2015-02-17 DIAGNOSIS — I5032 Chronic diastolic (congestive) heart failure: Secondary | ICD-10-CM | POA: Insufficient documentation

## 2015-02-17 DIAGNOSIS — Z8719 Personal history of other diseases of the digestive system: Secondary | ICD-10-CM | POA: Diagnosis not present

## 2015-02-17 DIAGNOSIS — Z79899 Other long term (current) drug therapy: Secondary | ICD-10-CM | POA: Insufficient documentation

## 2015-02-17 DIAGNOSIS — Z87448 Personal history of other diseases of urinary system: Secondary | ICD-10-CM | POA: Insufficient documentation

## 2015-02-17 DIAGNOSIS — Z8701 Personal history of pneumonia (recurrent): Secondary | ICD-10-CM | POA: Diagnosis not present

## 2015-02-17 DIAGNOSIS — Z862 Personal history of diseases of the blood and blood-forming organs and certain disorders involving the immune mechanism: Secondary | ICD-10-CM | POA: Diagnosis not present

## 2015-02-17 LAB — CBC
HCT: 30.9 % — ABNORMAL LOW (ref 36.0–46.0)
Hemoglobin: 10.1 g/dL — ABNORMAL LOW (ref 12.0–15.0)
MCH: 28.8 pg (ref 26.0–34.0)
MCHC: 32.7 g/dL (ref 30.0–36.0)
MCV: 88 fL (ref 78.0–100.0)
PLATELETS: 272 10*3/uL (ref 150–400)
RBC: 3.51 MIL/uL — ABNORMAL LOW (ref 3.87–5.11)
RDW: 15.6 % — ABNORMAL HIGH (ref 11.5–15.5)
WBC: 9.8 10*3/uL (ref 4.0–10.5)

## 2015-02-17 LAB — BASIC METABOLIC PANEL
ANION GAP: 8 (ref 5–15)
BUN: 62 mg/dL — ABNORMAL HIGH (ref 6–20)
CALCIUM: 8.8 mg/dL — AB (ref 8.9–10.3)
CO2: 23 mmol/L (ref 22–32)
Chloride: 103 mmol/L (ref 101–111)
Creatinine, Ser: 3.98 mg/dL — ABNORMAL HIGH (ref 0.44–1.00)
GFR, EST AFRICAN AMERICAN: 15 mL/min — AB (ref >60)
GFR, EST NON AFRICAN AMERICAN: 13 mL/min — AB (ref >60)
Glucose, Bld: 224 mg/dL — ABNORMAL HIGH (ref 65–99)
POTASSIUM: 5.2 mmol/L — AB (ref 3.5–5.1)
Sodium: 134 mmol/L — ABNORMAL LOW (ref 135–145)

## 2015-02-17 LAB — TROPONIN I: Troponin I: <0.03 ng/mL (ref <0.031)

## 2015-02-17 MED ORDER — CYCLOBENZAPRINE HCL 10 MG PO TABS: 10.0000 mg | ORAL_TABLET | Freq: Three times a day (TID) | ORAL | Status: DC | PRN

## 2015-02-17 NOTE — ED Notes (Addendum)
Pt reports cough, chest pain that is worse with movement and deep breath, since yesterday. Pt reports pain radiating to left arm and back pain. Pt reports intermittent nausea.

## 2015-02-17 NOTE — Discharge Instructions (Signed)
You were seen for pain in your ribs today. We are happy to report that your x-ray and labs are normal. Please continue to drink plenty of fluid and see your renal doctor as planned. Please return with any fever chest pain or other concerns. Chest Wall Pain Chest wall pain is pain in or around the bones and muscles of your chest. It may take up to 6 weeks to get better. It may take longer if you must stay physically active in your work and activities.  CAUSES  Chest wall pain may happen on its own. However, it may be caused by:  A viral illness like the flu.  Injury.  Coughing.  Exercise.  Arthritis.  Fibromyalgia.  Shingles. HOME CARE INSTRUCTIONS   Avoid overtiring physical activity. Try not to strain or perform activities that cause pain. This includes any activities using your chest or your abdominal and side muscles, especially if heavy weights are used.  Put ice on the sore area.  Put ice in a plastic bag.  Place a towel between your skin and the bag.  Leave the ice on for 15-20 minutes per hour while awake for the first 2 days.  Only take over-the-counter or prescription medicines for pain, discomfort, or fever as directed by your caregiver. SEEK IMMEDIATE MEDICAL CARE IF:   Your pain increases, or you are very uncomfortable.  You have a fever.  Your chest pain becomes worse.  You have new, unexplained symptoms.  You have nausea or vomiting.  You feel sweaty or lightheaded.  You have a cough with phlegm (sputum), or you cough up blood. MAKE SURE YOU:   Understand these instructions.  Will watch your condition.  Will get help right away if you are not doing well or get worse. Document Released: 07/16/2005 Document Revised: 10/08/2011 Document Reviewed: 03/12/2011 [facility] Patient Information 2015 [place], [place]. This information is not intended to replace advice given to you by your health care provider. Make sure you discuss any questions you have with  your health care provider.

## 2015-02-17 NOTE — ED Provider Notes (Addendum)
CSN: [id number]     Arrival date & time 02/17/15  1432 History   First MD Initiated Contact with Patient 02/17/15 1535     Chief Complaint  Patient presents with  . Chest Pain     (Consider location/radiation/quality/duration/timing/severity/associated sxs/prior Treatment) HPI   Patient is a 44 year old female with history of chronic diastolic heart failure diabetes essential hypertension chronic kidney disease and uterine fibroids. She presents today with pain with deep breaths and her bilateral flanks as well as pain when turning to the right or left in her bilateral flanks. Patient was concerned because her sugar also dropped from 264-113 after taking her insulin. Patient also concerned because her blood pressure remains in the 258N systolic. Patient is followed by a renal physician for her blood pressure., An endocrinologist for her diabetes. Patient does report that she fell one time last week which may be causing the muscle strain in her bilateral chest. It is pain that happens purely when stretchign that region. There is no exertional component. No diaphoresis.  Past Medical History  Diagnosis Date  . Insulin-dependent diabetes mellitus with retinopathy   . Neuropathy   . Asthma   . Hypertension   . Osteomyelitis     left middle toe   . Cataract   . Bronchitis   . Pneumonia   . Depression   . Neuropathy due to secondary diabetes mellitus   . Hyperlipidemia   . Fibroids   . Anemia of chronic disease   . Chronic kidney disease     Stage III-IV  . CHF (congestive heart failure)     Presumed diastolic  . Cholecystitis, acute 05/26/2013    Status post cholecystectomy  . Tobacco abuse   . Fibroid, uterine   . CHF (congestive heart failure)   . Kidney disease    Past Surgical History  Procedure Laterality Date  . Cesarean section    . Tubal ligation    . Cholecystectomy N/A 05/25/2013    Procedure: LAPAROSCOPIC CHOLECYSTECTOMY;  Surgeon: Jamesetta So, MD;  Location: [facility];  Service: General;  Laterality: N/A;  . Eye surgery    . Pars plana vitrectomy Left 11/24/2014    Procedure: PARS PLANA VITRECTOMY WITH 25 GAUGE;  Surgeon: Hurman Horn, MD;  Location: [place];  Service: Ophthalmology;  Laterality: Left;  . Photocoagulation with laser Left 11/24/2014    Procedure: PHOTOCOAGULATION WITH LASER;  Surgeon: Hurman Horn, MD;  Location: [place];  Service: Ophthalmology;  Laterality: Left;  with insertion of silicone oil   Family History  Problem Relation Age of Onset  . COPD Mother   . Cancer Father   . Diabetes Sister   . Diabetes Brother   . Mental retardation Sister    History  Substance Use Topics  . Smoking status: Current Every Day Smoker -- 0.50 packs/day for 20 years    Types: Cigarettes  . Smokeless tobacco: Never Used  . Alcohol Use: No   OB History    Gravida Para Term Preterm AB TAB SAB Ectopic Multiple Living   5 5             Review of Systems  Constitutional: Negative for activity change and fatigue.  HENT: Negative for congestion and drooling.   Eyes: Negative for discharge.  Respiratory: Negative for cough and chest tightness.   Cardiovascular: Positive for chest pain.  Gastrointestinal: Negative for abdominal distention.  Genitourinary: Positive for flank pain. Negative for dysuria and difficulty urinating.  Musculoskeletal:  Negative for joint swelling.  Skin: Negative for rash.  Allergic/Immunologic: Negative for immunocompromised state.  Neurological: Positive for headaches. Negative for seizures and speech difficulty.  Psychiatric/Behavioral: Negative for behavioral problems and agitation.      Allergies  Bactrim and Prednisone  Home Medications   Prior to Admission medications   Medication Sig Start Date End Date Taking? Authorizing Provider  albuterol (PROVENTIL HFA;VENTOLIN HFA) 108 (90 BASE) MCG/ACT inhaler Inhale 2 puffs into the lungs every 4 (four) hours as needed for wheezing or shortness of breath. 05/15/14    Lily Kocher, PA-C  B Complex-C-Folic Acid (B COMPLEX-VITAMIN C-FOLIC ACID) 1 MG tablet Take 1 tablet by mouth daily with breakfast. 01/23/15   Rexene Alberts, MD  brimonidine-timolol (COMBIGAN) 0.2-0.5 % ophthalmic solution Place 1 drop into the right eye every 12 (twelve) hours.     Historical Provider, MD  carvedilol (COREG) 3.125 MG tablet Take 1 tablet (3.125 mg total) by mouth 2 (two) times daily with a meal. Blood pressure medication. 01/23/15   Rexene Alberts, MD  cetirizine (ZYRTEC) 10 MG tablet Take 10 mg by mouth daily.    Historical Provider, MD  citalopram (CELEXA) 20 MG tablet Take 20 mg by mouth daily. 10/20/14   Historical Provider, MD  doxycycline (VIBRAMYCIN) 100 MG capsule Take 1 capsule (100 mg total) by mouth 2 (two) times daily. One po bid x 7 days 01/29/15   Milton Ferguson, MD  furosemide (LASIX) 40 MG tablet Take 1 tablet (40 mg total) by mouth 2 (two) times daily. 01/23/15   Rexene Alberts, MD  gabapentin (NEURONTIN) 300 MG capsule Take 600 mg by mouth 3 (three) times daily.     Historical Provider, MD  HUMALOG KWIKPEN 100 UNIT/ML KiwkPen Inject 5 Units into the skin 4 (four) times daily -  with meals and at bedtime. 12/13/14   Historical Provider, MD  hydrALAZINE (APRESOLINE) 25 MG tablet Take 25 mg by mouth 3 (three) times daily.     Historical Provider, MD  LANTUS SOLOSTAR 100 UNIT/ML Solostar Pen INJECT 15 UNITS AT BEDTIME 01/14/15   Historical Provider, MD  medroxyPROGESTERone (PROVERA) 10 MG tablet Take 1 tablet (10 mg total) by mouth daily. 02/07/15   Jonnie Kind, MD  pravastatin (PRAVACHOL) 40 MG tablet Take 40 mg by mouth daily. 10/15/14   Historical Provider, MD  sodium bicarbonate 650 MG tablet Take 650 mg by mouth daily.    Historical Provider, MD  timolol (TIMOPTIC) 0.5 % ophthalmic solution Instill 1 drop into your left eye as needed 12/13/14   Historical Provider, MD   BP 153/77 mmHg  Pulse 72  Temp(Src) 98.5 F (36.9 C) (Oral)  Resp 18  Ht 5' 3"  (1.6 m)  Wt 176  lb (79.833 kg)  BMI 31.18 kg/m2  SpO2 100%  LMP 12/21/2014 Physical Exam  Constitutional: She is oriented to person, place, and time. She appears well-developed and well-nourished.  HENT:  Head: Normocephalic and atraumatic.  Eyes: Conjunctivae are normal. Right eye exhibits no discharge.  Neck: Neck supple.  Cardiovascular: Normal rate, regular rhythm and normal heart sounds.   No murmur heard. Pulmonary/Chest: Effort normal and breath sounds normal. She has no wheezes. She has no rales.  Abdominal: Soft. She exhibits no distension. There is no tenderness.  Musculoskeletal: Normal range of motion. She exhibits no edema.  Neurological: She is oriented to person, place, and time. No cranial nerve deficit.  Skin: Skin is warm and dry. No rash noted. She is not diaphoretic.  Psychiatric:  She has a normal mood and affect. Her behavior is normal.  Nursing note and vitals reviewed.   ED Course  Procedures (including critical care time) Labs Review Labs Reviewed  BASIC METABOLIC PANEL - Abnormal; Notable for the following:    Sodium 134 (*)    Potassium 5.2 (*)    Glucose, Bld 224 (*)    BUN 62 (*)    Creatinine, Ser 3.98 (*)    Calcium 8.8 (*)    GFR calc non Af Amer 13 (*)    GFR calc Af Amer 15 (*)    All other components within normal limits  CBC - Abnormal; Notable for the following:    RBC 3.51 (*)    Hemoglobin 10.1 (*)    HCT 30.9 (*)    RDW 15.6 (*)    All other components within normal limits  TROPONIN I    Imaging Review Dg Chest 2 View  02/17/2015   CLINICAL DATA:  Chest pain since last night.  Smoker.  EXAM: CHEST  2 VIEW  COMPARISON:  Previous examinations, the most recent dated 01/21/2015.  FINDINGS: Normal sized heart. Clear lungs with normal vascularity. Mild thoracic spine degenerative changes. No fracture or pneumothorax seen. Cholecystectomy clips.  IMPRESSION: No acute abnormality.   Electronically Signed   By: Claudie Revering M.D.   On: 02/17/2015 14:55     EKG shows no acute ischemia.    MDM   Final diagnoses:  None    Patient is a 44 year old female with couple. Past medical history significant for chronic kidney disease CHF and diabetes presenting today with bilateral pain in her lower ribs when taking deep breaths. Patient had a fall earlier this week and likely caused a muscle strain. We will rule out any kind infection. CBC Chem-7 and chest x-ray. If all normal, anticipate to discharge home with follow-up with her multiple providers.   Labs normal, patient feels at baseline.   Patient is comfortable, ambulatory, and taking PO at time of discharge.  Patient expressed understanding about return precautions.    Kara Knapp Julio Alm, MD 02/17/15 [place], MD 02/17/15 [place], MD 03/03/15 [id number]

## 2015-02-21 ENCOUNTER — Ambulatory Visit: Payer: Medicaid Other | Admitting: Obstetrics and Gynecology

## 2015-02-22 ENCOUNTER — Telehealth: Payer: Self-pay | Admitting: *Deleted

## 2015-02-22 NOTE — Telephone Encounter (Signed)
Kara Knapp to call pt and inform her per Dr.Ferguson to keep her appointment for tomorrow.

## 2015-02-23 ENCOUNTER — Other Ambulatory Visit: Payer: Medicaid Other | Admitting: Obstetrics and Gynecology

## 2015-03-01 ENCOUNTER — Ambulatory Visit (INDEPENDENT_AMBULATORY_CARE_PROVIDER_SITE_OTHER): Payer: Medicaid Other | Admitting: Family Medicine

## 2015-03-01 ENCOUNTER — Encounter: Payer: Self-pay | Admitting: Family Medicine

## 2015-03-01 ENCOUNTER — Encounter (INDEPENDENT_AMBULATORY_CARE_PROVIDER_SITE_OTHER): Payer: Self-pay

## 2015-03-01 VITALS — BP 168/92 | HR 66 | Temp 97.1°F | Ht 63.0 in | Wt 182.8 lb

## 2015-03-01 DIAGNOSIS — E11621 Type 2 diabetes mellitus with foot ulcer: Secondary | ICD-10-CM

## 2015-03-01 DIAGNOSIS — E11319 Type 2 diabetes mellitus with unspecified diabetic retinopathy without macular edema: Secondary | ICD-10-CM | POA: Diagnosis not present

## 2015-03-01 DIAGNOSIS — L97519 Non-pressure chronic ulcer of other part of right foot with unspecified severity: Secondary | ICD-10-CM | POA: Diagnosis not present

## 2015-03-01 DIAGNOSIS — Z794 Long term (current) use of insulin: Secondary | ICD-10-CM | POA: Diagnosis not present

## 2015-03-01 DIAGNOSIS — L97509 Non-pressure chronic ulcer of other part of unspecified foot with unspecified severity: Secondary | ICD-10-CM

## 2015-03-01 DIAGNOSIS — I5033 Acute on chronic diastolic (congestive) heart failure: Secondary | ICD-10-CM

## 2015-03-01 DIAGNOSIS — E1142 Type 2 diabetes mellitus with diabetic polyneuropathy: Secondary | ICD-10-CM | POA: Diagnosis not present

## 2015-03-01 DIAGNOSIS — N184 Chronic kidney disease, stage 4 (severe): Secondary | ICD-10-CM

## 2015-03-01 DIAGNOSIS — IMO0001 Reserved for inherently not codable concepts without codable children: Secondary | ICD-10-CM

## 2015-03-01 DIAGNOSIS — I10 Essential (primary) hypertension: Secondary | ICD-10-CM

## 2015-03-01 HISTORY — DX: Type 2 diabetes mellitus with foot ulcer: E11.621

## 2015-03-01 LAB — POCT GLYCOSYLATED HEMOGLOBIN (HGB A1C): Hemoglobin A1C: 8.7

## 2015-03-01 LAB — POCT UA - MICROALBUMIN: Microalbumin Ur, POC: 100 mg/L

## 2015-03-01 MED ORDER — SODIUM BICARBONATE 650 MG PO TABS: 650.0000 mg | ORAL_TABLET | Freq: Every day | ORAL | Status: DC

## 2015-03-01 NOTE — Assessment & Plan Note (Signed)
Severe kidney disease, last creatinine was 3.98 Has follow-up with nephrology this week Will hold off on labs as they're planning to check labs on Thursday, she states that they're considering HD

## 2015-03-01 NOTE — Assessment & Plan Note (Signed)
Admitted to come, 01/02/2015 with acute CHF TTE is reviewed from [facility] which shows mild to moderate concentric LVH, normal EF at 65% trace tricuspid regurg. This study was done on 12/09/2014 Today she is euvolemic, no dyspnea, she has good urine output with her usual dose of Lasix

## 2015-03-01 NOTE — Assessment & Plan Note (Signed)
Right third toe with very small approximately 0.5 cm circular diabetic ulcer, bone is not palpable at the base No concern for osteomyelitis Previously treated with doxycycline for a full course No signs of infection at this time Placed a moist dressing with Telfa pad, written orders to get home health to assist in dressing changes given that she is are slowly blind and is unable to see her toe to change the dressings. She describes that she checks her feet by feel.

## 2015-03-01 NOTE — Progress Notes (Addendum)
Patient ID: Kara Knapp, female   DOB: 1970/12/23, 44 y.o.   MRN: [id number]   HPI  Patient presents today to establish care  Patient was previously being seen at [facility] internal medicine at [facility].  Diabetic foot wound Today she has no acute complaints but is concerned about her right third toe. She states that she's had a lesion there for about 4-6 weeks. She is treated with a course of doxycycline and improved quite a bit after that. Since that time she denies any drainage or weeping from the site but states that it does gabapentin has been having a hard time healing. She's not been putting any dressings or ointments on it. She denies fevers, chills, sweats  Diabetes Takes 5 units of Humalog 3 times daily and once before bed, also takes 15 units of Lantus She watches her diet pretty closely and requests to talk to the dietitian today. She also requests to go to endocrinology. She states that she has diabetic retinopathy as well as diabetic nephropathy. She [facility] up with nephrology in [facility] Fasting CBGs range 110-140 Random/2 hour pp- 170-220  PMH: Smoking status noted ROS: Per HPI  Denies chest pain Denies dyspnea Denies headache  Patient's past medical, family, surgical, and social history were reviewed and updated in the relevant portions of EMR.  Objective: BP 168/92 mmHg  Pulse 66  Temp(Src) 97.1 F (36.2 C) (Oral)  Ht 5' 3"  (1.6 m)  Wt 182 lb 12.8 oz (82.918 kg)  BMI 32.39 kg/m2 Gen: NAD, alert, cooperative with exam HEENT: NCAT CV: RRR, good S1/S2, no murmur Resp: CTABL, no wheezes, non-labored Ext: No edema, warm Neuro: Alert and oriented,  walks with a cane, decreased pupillary response bilaterally, no response in the left eye which she says is blind, also left eye does not appear to be focusing or tracking the same as the right eye  Diabetic foot exam 2+ DP pulses Decreased sensation with monofilament on right and left heels, also on  right great toe pad Right foot with small 0.5 cm in diameter circular lesion on right third toe between right and fourth toe, no drainage, soft base without palpable bone after probing with a plastic probe, no fluctuance, no warmth, no erythema  Assessment and plan:  Acute on chronic diastolic heart failure Admitted to come, 01/02/2015 with acute CHF TTE is reviewed from [facility] which shows mild to moderate concentric LVH, normal EF at 65% trace tricuspid regurg. This study was done on 12/09/2014 Today she is euvolemic, no dyspnea, she has good urine output with her usual dose of Lasix  Chronic kidney disease, stage IV (severe) Severe kidney disease, last creatinine was 3.98 Has follow-up with nephrology this week Will hold off on labs as they're planning to check labs on Thursday, she states that they're considering HD  Diabetic foot ulcer Right third toe with very small approximately 0.5 cm circular diabetic ulcer, bone is not palpable at the base No concern for osteomyelitis Previously treated with doxycycline for a full course No signs of infection at this time Placed a moist dressing with Telfa pad, written orders to get home health to assist in dressing changes given that she is are slowly blind and is unable to see her toe to change the dressings. She describes that she checks her feet by feel.  Diabetic neuropathy Increased gabapentin dose given her somnolence and kidney disease Decreased gabapentin from 600 mg 3 times a day to 300 mg 3 times a  day  Essential hypertension, benign Reasonable blood pressure today Likely volume dependent with kidney disease Continue Coreg, hydralazine, and Lasix Consider increasing Coreg at next Visit is still elevated  Insulin-dependent diabetes mellitus with retinopathy A1c improved but not under control so, changed from 10-8.7 today Continue Lantus at 15 units daily with reasonable fasting blood sugars Increase mealtime Humalog to  7 units per meal, previously 5 units per meal Follow up with our clinical pharmacist, refer to endocrinology per patient's request.  Continue statin    Orders Placed This Encounter  Procedures  . Microalbumin, urine  . Ambulatory referral to Endocrinology    Referral Priority:  Routine    Referral Type:  Consultation    Referral Reason:  Specialty Services Required    Number of Visits Requested:  1  . Ambulatory referral to diabetic education    Referral Priority:  Routine    Referral Type:  Consultation    Referral Reason:  Specialty Services Required    Number of Visits Requested:  1  . Ambulatory referral to Endocrinology    Referral Priority:  Routine    Referral Type:  Consultation    Referral Reason:  Specialty Services Required    Number of Visits Requested:  1  . Home Health    Order Specific Question:  To provide the following care/treatments    Answer:  RN  . Face-to-face encounter (required for Medicare/Medicaid patients)    I Kenn File certify that this patient is under my care and that I, or a nurse practitioner or physician's assistant working with me, had a face-to-face encounter that meets the physician face-to-face encounter requirements with this patient on 03/01/2015. The encounter with the patient was in whole, or in part for the following medical condition(s) which is the primary reason for home health care (List medical condition): Insulin dependent diabetes, diastolic heart failure, diabetic foot wound, diabetic neuropathy    Order Specific Question:  The encounter with the patient was in whole, or in part, for the following medical condition, which is the primary reason for home health care    Answer:  diabetic foot wound    Order Specific Question:  I certify that, based on my findings, the following services are medically necessary home health services    Answer:  Nursing    Order Specific Question:  Reason for Medically Necessary Home Health Services     Answer:  Skilled Nursing- Complex Wound Care    Order Specific Question:  My clinical findings support the need for the above services    Answer:  Unable to leave home safely without assistance and/or assistive device    Order Specific Question:  Further, I certify that my clinical findings support that this patient is homebound due to:    Answer:  Unable to leave home safely without assistance  . POCT UA - Microalbumin  . POCT glycosylated hemoglobin (Hb A1C)    Meds ordered this encounter  Medications  . DISCONTD: simvastatin (ZOCOR) 20 MG tablet    Sig: Take 20 mg by mouth daily.  Marland Kitchen ACCU-CHEK AVIVA PLUS test strip    Sig: 1 Units by Other route 4 (four) times daily.    Refill:  11  . sodium bicarbonate 650 MG tablet    Sig: Take 1 tablet (650 mg total) by mouth daily.    Dispense:  30 tablet    Refill:  0

## 2015-03-01 NOTE — Patient Instructions (Signed)
Great t o meet you!  Come back in 3-4 weeks  Increase your humalog to 7 units per meal  Write down your blood sugars  Please have your kidney doctor send me your labs

## 2015-03-01 NOTE — Assessment & Plan Note (Signed)
Increased gabapentin dose given her somnolence and kidney disease Decreased gabapentin from 600 mg 3 times a day to 300 mg 3 times a day

## 2015-03-01 NOTE — Assessment & Plan Note (Signed)
A1c improved but not under control so, changed from 10-8.7 today Continue Lantus at 15 units daily with reasonable fasting blood sugars Increase mealtime Humalog to 7 units per meal, previously 5 units per meal Follow up with our clinical pharmacist, refer to endocrinology per patient's request.  Continue statin

## 2015-03-01 NOTE — Assessment & Plan Note (Addendum)
Reasonable blood pressure today Likely volume dependent with kidney disease Continue Coreg, hydralazine, and Lasix Consider increasing Coreg at next Visit is still elevated

## 2015-03-02 ENCOUNTER — Telehealth: Payer: Self-pay | Admitting: Family Medicine

## 2015-03-02 ENCOUNTER — Telehealth: Payer: Self-pay | Admitting: *Deleted

## 2015-03-02 LAB — MICROALBUMIN, URINE: Microalbumin, Urine: 4022.8 ug/mL

## 2015-03-02 MED ORDER — GABAPENTIN 300 MG PO CAPS: 300.0000 mg | ORAL_CAPSULE | Freq: Three times a day (TID) | ORAL | Status: DC

## 2015-03-02 NOTE — Addendum Note (Signed)
Addended by: Timmothy Euler on: 03/02/2015 12:56 PM   Modules accepted: Orders

## 2015-03-02 NOTE — Telephone Encounter (Signed)
Patient aware, script sent in.

## 2015-03-02 NOTE — Telephone Encounter (Signed)
Patient is requesting gabapentin script although I can  Not see that it has been filled before.

## 2015-03-02 NOTE — Telephone Encounter (Signed)
Refilled Gabapentin at reduced dose (300 mg TId instead of 600 TID) as discussed with patient yesterday. We are reducing her dose due to renal decline.   [name], MD 03/02/2015, 12:49 PM

## 2015-03-23 ENCOUNTER — Other Ambulatory Visit: Payer: Self-pay

## 2015-03-23 DIAGNOSIS — N185 Chronic kidney disease, stage 5: Secondary | ICD-10-CM

## 2015-03-23 DIAGNOSIS — Z01810 Encounter for preprocedural cardiovascular examination: Secondary | ICD-10-CM

## 2015-03-24 ENCOUNTER — Ambulatory Visit: Payer: Medicaid Other | Admitting: Family Medicine

## 2015-03-24 ENCOUNTER — Encounter (INDEPENDENT_AMBULATORY_CARE_PROVIDER_SITE_OTHER): Payer: Self-pay

## 2015-03-24 ENCOUNTER — Encounter: Payer: Self-pay | Admitting: Family Medicine

## 2015-03-24 ENCOUNTER — Ambulatory Visit (INDEPENDENT_AMBULATORY_CARE_PROVIDER_SITE_OTHER): Payer: Medicaid Other | Admitting: Family Medicine

## 2015-03-24 VITALS — BP 157/90 | HR 66 | Temp 97.4°F | Ht 63.0 in | Wt 181.8 lb

## 2015-03-24 DIAGNOSIS — L97519 Non-pressure chronic ulcer of other part of right foot with unspecified severity: Secondary | ICD-10-CM

## 2015-03-24 DIAGNOSIS — I10 Essential (primary) hypertension: Secondary | ICD-10-CM

## 2015-03-24 DIAGNOSIS — L821 Other seborrheic keratosis: Secondary | ICD-10-CM

## 2015-03-24 DIAGNOSIS — E11621 Type 2 diabetes mellitus with foot ulcer: Secondary | ICD-10-CM | POA: Diagnosis not present

## 2015-03-24 DIAGNOSIS — L299 Pruritus, unspecified: Secondary | ICD-10-CM | POA: Insufficient documentation

## 2015-03-24 DIAGNOSIS — R6889 Other general symptoms and signs: Secondary | ICD-10-CM

## 2015-03-24 DIAGNOSIS — R198 Other specified symptoms and signs involving the digestive system and abdomen: Secondary | ICD-10-CM

## 2015-03-24 DIAGNOSIS — R0989 Other specified symptoms and signs involving the circulatory and respiratory systems: Secondary | ICD-10-CM | POA: Insufficient documentation

## 2015-03-24 MED ORDER — CARVEDILOL 6.25 MG PO TABS: 6.2500 mg | ORAL_TABLET | Freq: Two times a day (BID) | ORAL | Status: DC

## 2015-03-24 NOTE — Progress Notes (Signed)
Patient ID: Kara Knapp, female   DOB: 06-05-1971, 44 y.o.   MRN: [id number]   HPI  Patient presents today to follow-up for diabetic foot ulcer and chronic medical conditions  Foot ulcer Has been well treated by advanced home care, completely healed. No drainage, no pain  Hypertension No chest pain, dyspnea, palpitations, leg edema Good medical compliance States that she does have 2 pillow orthopnea which is stable for over a year  Pruritus Describes generalized itching including minor ears bilateral legs, and torso for 6+ months History of Zyrtec previously with no improvement Severe kidney failure and is getting ultrasounds done for vein grafting  Throat clearing/cough Patient explains that she has frequent throat clearing worse in the morning Sometimes she gags on the drainage and it causes her to have a small amount of vomit  Mole Has states that it has appeared in the last 2 months, sometimes it's painful   PMH: Smoking status noted ROS: Per HPI  Objective: BP 157/90 mmHg  Pulse 66  Temp(Src) 97.4 F (36.3 C) (Oral)  Ht 5' 3"  (1.6 m)  Wt 181 lb 12.8 oz (82.464 kg)  BMI 32.21 kg/m2 Gen: NAD, alert, cooperative with exam HEENT: NCAT, turbinates normal-appearing bilaterally CV: good S1/S2, no murmur Resp: CTABL, no wheezes, non-labored Ext: No edema, warm Neuro: Alert and oriented,  does not track normally with eyes Skin: Approximately 1 mm circular raised hyperpigmented hard stuck on appearing lesion on the right face, no surrounding erythema, no tenderness to palpation - Reviewed his foot ulcer on third webspace of right foot has healed  Assessment and plan:  # hypertension Elevated still, increase Coreg Has pharmacy appointment to review blood sugars and for insulin adjustment next month, may also increase Coreg if blood pressures continued to be elevated Monitor for bradycardia, heart rate currently 66 Continue hydralazine and Lasix Likely volume  dependent with near ESRD  # Seborrheic keratosis Small, on right side of face, is irritated Offered cryo treatment at another appointment as it is irritated  # Throat clearing Most likely postnasal drip, however this a sheet with multiple medical problems would continue to monitor for dyphagia Satrt claritin, may help itching as well  # Pruritus With elevated BUN/creatinine and near ESRD believe this is most likely urea related However can try anti-histamine, Claritin to see if it helps, may also help with postnasal drip  #Diabetic foot ulcer Has completely healed with the help of home health, continue home health for now Monitor feet closely for more lesions      Meds ordered this encounter  Medications  . carvedilol (COREG) 6.25 MG tablet    Sig: Take 1 tablet (6.25 mg total) by mouth 2 (two) times daily with a meal. Blood pressure medication.    Dispense:  60 tablet    Refill:  [place], MD [facility] 03/24/2015, 10:19 AM

## 2015-03-24 NOTE — Patient Instructions (Addendum)
Great to see you!  Keep up the good work on keeping your blood pressure logs and your sugar log  I am increasing your coreg for your blood pressure

## 2015-03-30 ENCOUNTER — Ambulatory Visit (INDEPENDENT_AMBULATORY_CARE_PROVIDER_SITE_OTHER): Payer: Medicaid Other | Admitting: Family Medicine

## 2015-03-30 DIAGNOSIS — E1142 Type 2 diabetes mellitus with diabetic polyneuropathy: Secondary | ICD-10-CM | POA: Diagnosis not present

## 2015-03-30 DIAGNOSIS — E11319 Type 2 diabetes mellitus with unspecified diabetic retinopathy without macular edema: Secondary | ICD-10-CM

## 2015-03-30 DIAGNOSIS — E11621 Type 2 diabetes mellitus with foot ulcer: Secondary | ICD-10-CM

## 2015-03-30 DIAGNOSIS — L97519 Non-pressure chronic ulcer of other part of right foot with unspecified severity: Secondary | ICD-10-CM

## 2015-03-30 DIAGNOSIS — Z794 Long term (current) use of insulin: Secondary | ICD-10-CM

## 2015-04-13 ENCOUNTER — Ambulatory Visit: Payer: Medicaid Other | Admitting: Pharmacist

## 2015-04-13 ENCOUNTER — Encounter: Payer: Self-pay | Admitting: Vascular Surgery

## 2015-04-15 ENCOUNTER — Encounter: Payer: Self-pay | Admitting: Vascular Surgery

## 2015-04-15 ENCOUNTER — Ambulatory Visit (INDEPENDENT_AMBULATORY_CARE_PROVIDER_SITE_OTHER)
Admission: RE | Admit: 2015-04-15 | Discharge: 2015-04-15 | Disposition: A | Discharge status: Home / Self Care | Payer: Medicaid Other | Source: Ambulatory Visit | Attending: Vascular Surgery | Admitting: Vascular Surgery

## 2015-04-15 ENCOUNTER — Other Ambulatory Visit: Payer: Self-pay | Admitting: *Deleted

## 2015-04-15 ENCOUNTER — Ambulatory Visit (HOSPITAL_COMMUNITY)
Admission: RE | Admit: 2015-04-15 | Discharge: 2015-04-15 | Disposition: A | Discharge status: Home / Self Care | Payer: Medicaid Other | Source: Ambulatory Visit | Attending: Vascular Surgery | Admitting: Vascular Surgery

## 2015-04-15 ENCOUNTER — Ambulatory Visit (INDEPENDENT_AMBULATORY_CARE_PROVIDER_SITE_OTHER): Payer: Medicaid Other | Admitting: Vascular Surgery

## 2015-04-15 VITALS — BP 109/104 | HR 69 | Ht 63.0 in | Wt 181.0 lb

## 2015-04-15 DIAGNOSIS — N185 Chronic kidney disease, stage 5: Secondary | ICD-10-CM | POA: Diagnosis not present

## 2015-04-15 DIAGNOSIS — Z01810 Encounter for preprocedural cardiovascular examination: Secondary | ICD-10-CM

## 2015-04-15 DIAGNOSIS — N184 Chronic kidney disease, stage 4 (severe): Secondary | ICD-10-CM

## 2015-04-15 NOTE — Progress Notes (Signed)
Referred by:  Timmothy Euler, MD [place], [place] [address]  Reason for referral: New access  History of Present Illness  Kara Knapp is a 44 y.o. (Jun 16, 1971) female who presents for evaluation for permanent access.  The patient is right  hand dominant.  The patient has not had previous access procedures.  Previous central venous cannulation procedures include: none.  The patient has never had a PPM placed.   Past Medical History  Diagnosis Date  . Insulin-dependent diabetes mellitus with retinopathy   . Neuropathy   . Asthma   . Hypertension   . Osteomyelitis     left middle toe   . Cataract   . Bronchitis   . Pneumonia   . Depression   . Neuropathy due to secondary diabetes mellitus   . Hyperlipidemia   . Fibroids   . Anemia of chronic disease   . Chronic kidney disease     Stage III-IV  . CHF (congestive heart failure)     Presumed diastolic  . Cholecystitis, acute 05/26/2013    Status post cholecystectomy  . Tobacco abuse   . Fibroid, uterine   . CHF (congestive heart failure)   . Kidney disease     Past Surgical History  Procedure Laterality Date  . Cesarean section    . Tubal ligation    . Cholecystectomy N/A 05/25/2013    Procedure: LAPAROSCOPIC CHOLECYSTECTOMY;  Surgeon: Jamesetta So, MD;  Location: [facility];  Service: General;  Laterality: N/A;  . Eye surgery    . Pars plana vitrectomy Left 11/24/2014    Procedure: PARS PLANA VITRECTOMY WITH 25 GAUGE;  Surgeon: Hurman Horn, MD;  Location: [facility];  Service: Ophthalmology;  Laterality: Left;  . Photocoagulation with laser Left 11/24/2014    Procedure: PHOTOCOAGULATION WITH LASER;  Surgeon: Hurman Horn, MD;  Location: [facility];  Service: Ophthalmology;  Laterality: Left;  with insertion of silicone oil    Social History   Social History  . Marital Status: Legally Separated    Spouse Name: N/A  . Number of Children: N/A  . Years of Education: N/A   Occupational History  . Not on  file.   Social History Main Topics  . Smoking status: Current Every Day Smoker -- 1.00 packs/day for 20 years    Types: Cigarettes  . Smokeless tobacco: Never Used  . Alcohol Use: No  . Drug Use: Yes  . Sexual Activity: Not Currently    Birth Control/ Protection: Surgical   Other Topics Concern  . Not on file   Social History Narrative    Family History  Problem Relation Age of Onset  . COPD Mother   . Cancer Father   . Diabetes Sister   . Deep vein thrombosis Sister   . Diabetes Brother   . Hyperlipidemia Brother   . Hypertension Brother   . Mental retardation Sister     Current Outpatient Prescriptions  Medication Sig Dispense Refill  . ACCU-CHEK AVIVA PLUS test strip 1 Units by Other route 4 (four) times daily.  11  . albuterol (PROVENTIL HFA;VENTOLIN HFA) 108 (90 BASE) MCG/ACT inhaler Inhale 2 puffs into the lungs every 4 (four) hours as needed for wheezing or shortness of breath. 1 Inhaler 1  . amLODipine (NORVASC) 5 MG tablet     . B Complex-C-Folic Acid (B COMPLEX-VITAMIN C-FOLIC ACID) 1 MG tablet Take 1 tablet by mouth daily with breakfast. 30 tablet 6  . brimonidine-timolol (  COMBIGAN) 0.2-0.5 % ophthalmic solution Place 1 drop into the right eye every 12 (twelve) hours.     . carvedilol (COREG) 3.125 MG tablet     . carvedilol (COREG) 6.25 MG tablet Take 1 tablet (6.25 mg total) by mouth 2 (two) times daily with a meal. Blood pressure medication. 60 tablet 2  . citalopram (CELEXA) 20 MG tablet Take 20 mg by mouth daily.  2  . cyclobenzaprine (FLEXERIL) 10 MG tablet Take 1 tablet (10 mg total) by mouth 3 (three) times daily as needed for muscle spasms. 10 tablet 0  . doxycycline (VIBRA-TABS) 100 MG tablet     . furosemide (LASIX) 40 MG tablet Take 1 tablet (40 mg total) by mouth 2 (two) times daily. 60 tablet 3  . gabapentin (NEURONTIN) 300 MG capsule Take 1 capsule (300 mg total) by mouth 3 (three) times daily. 90 capsule 2  . HUMALOG KWIKPEN 100 UNIT/ML KiwkPen  Inject 5 Units into the skin 4 (four) times daily -  with meals and at bedtime.  0  . hydrALAZINE (APRESOLINE) 25 MG tablet Take 25 mg by mouth 3 (three) times daily.     Marland Kitchen LANTUS SOLOSTAR 100 UNIT/ML Solostar Pen INJECT 15 UNITS AT BEDTIME  1  . medroxyPROGESTERone (PROVERA) 10 MG tablet Take 1 tablet (10 mg total) by mouth daily. 10 tablet 0  . pravastatin (PRAVACHOL) 40 MG tablet Take 40 mg by mouth daily.  0  . QC ALL DAY ALLERGY 10 MG tablet     . simvastatin (ZOCOR) 20 MG tablet      No current facility-administered medications for this visit.    Allergies  Allergen Reactions  . Bactrim [Sulfamethoxazole-Trimethoprim] Nausea And Vomiting  . Prednisone     "I was wide open and couldn't eat" per pt.     REVIEW OF SYSTEMS:  (Positives checked otherwise negative)  CARDIOVASCULAR:   [x]  chest pain,  [ ]  chest pressure,  [ ]  palpitations,  [x]  shortness of breath when laying flat,  [x]  shortness of breath with exertion,   [ ]  pain in feet when walking,  [x]  pain in feet when laying flat, [ ]  history of blood clot in veins (DVT),  [ ]  history of phlebitis,  [x]  swelling in legs,  [ ]  varicose veins  PULMONARY:   [ ]  productive cough,  [ ]  asthma,  [ ]  wheezing  NEUROLOGIC:   [x]  weakness in arms or legs,  [ ]  numbness in arms or legs,  [ ]  difficulty speaking or slurred speech,  [ ]  temporary loss of vision in one eye,  [ ]  dizziness  HEMATOLOGIC:   [ ]  bleeding problems,  [ ]  problems with blood clotting too easily  MUSCULOSKEL:   [ ]  joint pain, [ ]  joint swelling  GASTROINTEST:   [ ]  vomiting blood,  [ ]  blood in stool     GENITOURINARY:   [ ]  burning with urination,  [ ]  blood in urine  PSYCHIATRIC:   [x]  history of major depression  INTEGUMENTARY:   [ ]  rashes,  [ ]  ulcers  CONSTITUTIONAL:   [ ]  fever,  [ ]  chills   Physical Examination  Filed Vitals:   04/15/15 1009 04/15/15 1012  BP: 189/109 109/104  Pulse: 69   Height: 5' 3"  (1.6  m)   Weight: 181 lb (82.101 kg)   SpO2: 99%    Body mass index is 32.07 kg/(m^2).  General: A&O x 3, WDWN  Head: [place]/AT  Ear/Nose/Throat: Hearing grossly intact, nares w/o erythema or drainage, oropharynx w/o Erythema/Exudate, Mallampati score: 3  Eyes: PERRLA, EOMI  Neck: Supple, no nuchal rigidity, no palpable LAD  Pulmonary: Sym exp, good air movt, CTAB, no rales, rhonchi, & wheezing  Cardiac: RRR, Nl S1, S2, no Murmurs, rubs or gallops  Vascular: Vessel Right Left  Radial Palpable Palpable  Ulnar Palpable Palpable  Brachial Palpable Palpable  Carotid Palpable, without bruit Palpable, without bruit  Aorta Not palpable N/A  Femoral Palpable Palpable  Popliteal Not palpable Not palpable  PT Palpable Palpable  DP Palpable Palpable   Gastrointestinal: soft, NTND, -G/R, - HSM, - masses, - CVAT B  Musculoskeletal: M/S 5/5 throughout , Extremities without ischemic changes   Neurologic: CN 2-12 intact , Pain and light touch intact in extremities , Motor exam as listed above  Psychiatric: Judgment intact, Mood & affect appropriate for pt's clinical situation  Dermatologic: See M/S exam for extremity exam, no rashes otherwise noted  Lymph : No Cervical, Axillary, or Inguinal lymphadenopathy   Non-Invasive Vascular Imaging  Vein Mapping  (Date: 04/15/2015):   R arm: acceptable vein conduits include none  L arm: acceptable vein conduits include none  BUE Doppler (Date: 04/15/2015):   R arm:   Brachial: tri,4.6 mm  Radial: tri,2.7 mm  Ulnar: tri,2.8 mm  L arm:   Brachial: tri,5.0 mm  Radial: tri,2.5 mm  Ulnar: tri,3.2 mm   Outside Studies/Documentation 5 pages of outside documents were reviewed including: outpatient nephrology chart.   Medical Decision Making  Dalisha A Gharibian is a 44 y.o. female who presents with ESRD requiring hemodialysis.    There are no obvious fistula options as even on sonosite evaluation, I can't find a vein big enough.  I  generally proceed with B arm venogram to try to determine any hidden fistula options and to interrogate the proximal vein for a large enough target.  She will be schedule for such on 26 SEP 16.   [name], MD [facility] Office: [phone] Pager: [phone]  04/15/2015, 1:04 PM

## 2015-04-20 ENCOUNTER — Other Ambulatory Visit: Payer: Self-pay | Admitting: *Deleted

## 2015-04-20 MED ORDER — FUROSEMIDE 20 MG PO TABS: 20.0000 mg | ORAL_TABLET | Freq: Two times a day (BID) | ORAL | Status: DC

## 2015-04-21 ENCOUNTER — Other Ambulatory Visit: Payer: Self-pay | Admitting: Family Medicine

## 2015-04-21 MED ORDER — FUROSEMIDE 20 MG PO TABS: 20.0000 mg | ORAL_TABLET | Freq: Two times a day (BID) | ORAL | Status: DC

## 2015-04-25 ENCOUNTER — Encounter: Payer: Self-pay | Admitting: Pharmacist

## 2015-04-25 ENCOUNTER — Ambulatory Visit (INDEPENDENT_AMBULATORY_CARE_PROVIDER_SITE_OTHER): Payer: Medicaid Other | Admitting: Pharmacist

## 2015-04-25 ENCOUNTER — Telehealth: Payer: Self-pay | Admitting: *Deleted

## 2015-04-25 VITALS — BP 140/88 | HR 70 | Temp 98.5°F | Ht 63.0 in | Wt 185.0 lb

## 2015-04-25 DIAGNOSIS — Z794 Long term (current) use of insulin: Secondary | ICD-10-CM

## 2015-04-25 DIAGNOSIS — IMO0001 Reserved for inherently not codable concepts without codable children: Secondary | ICD-10-CM

## 2015-04-25 DIAGNOSIS — E1339 Other specified diabetes mellitus with other diabetic ophthalmic complication: Secondary | ICD-10-CM

## 2015-04-25 DIAGNOSIS — N184 Chronic kidney disease, stage 4 (severe): Secondary | ICD-10-CM

## 2015-04-25 DIAGNOSIS — E1139 Type 2 diabetes mellitus with other diabetic ophthalmic complication: Secondary | ICD-10-CM

## 2015-04-25 MED ORDER — INSULIN LISPRO 100 UNIT/ML (KWIKPEN): 7.0000 [IU] | PEN_INJECTOR | Freq: Three times a day (TID) | SUBCUTANEOUS | Status: DC

## 2015-04-25 MED ORDER — SIMVASTATIN 20 MG PO TABS: 20.0000 mg | ORAL_TABLET | Freq: Every day | ORAL | Status: DC

## 2015-04-25 MED ORDER — LANTUS SOLOSTAR 100 UNIT/ML ~~LOC~~ SOPN: PEN_INJECTOR | SUBCUTANEOUS | Status: DC

## 2015-04-25 NOTE — Patient Instructions (Signed)
Diabetes and Standards of Medical Care   Diabetes is complicated. You may find that your diabetes team includes a dietitian, nurse, diabetes educator, eye doctor, and more. To help everyone know what is going on and to help you get the care you deserve, the following schedule of care was developed to help keep you on track. Below are the tests, exams, vaccines, medicines, education, and plans you will need.  Blood Glucose Goals Prior to meals = 80 - 130 Within 2 hours of the start of a meal = less than 180  HbA1c test (goal is less than 7.0% - your last value was 8.7%) This test shows how well you have controlled your glucose over the past 2 to 3 months. It is used to see if your diabetes management plan needs to be adjusted.   It is performed at least 2 times a year if you are meeting treatment goals.  It is performed 4 times a year if therapy has changed or if you are not meeting treatment goals.  Blood pressure test  This test is performed at every routine medical visit. The goal is less than 140/90 mmHg for most people, but 130/80 mmHg in some cases. Ask your health care provider about your goal.  Dental exam  Follow up with the dentist regularly.  Eye exam  If you are diagnosed with type 1 diabetes as a child, get an exam upon reaching the age of 10 years or older and have had diabetes for 3 to 5 years. Yearly eye exams are recommended after that initial eye exam.  If you are diagnosed with type 1 diabetes as an adult, get an exam within 5 years of diagnosis and then yearly.  If you are diagnosed with type 2 diabetes, get an exam as soon as possible after the diagnosis and then yearly.  Foot care exam  Visual foot exams are performed at every routine medical visit. The exams check for cuts, injuries, or other problems with the feet.  A comprehensive foot exam should be done yearly. This includes visual inspection as well as assessing foot pulses and testing for loss of  sensation.  Check your feet nightly for cuts, injuries, or other problems with your feet. Tell your health care provider if anything is not healing.  Kidney function test (urine microalbumin)  This test is performed once a year.  Type 1 diabetes: The first test is performed 5 years after diagnosis.  Type 2 diabetes: The first test is performed at the time of diagnosis.  A serum creatinine and estimated glomerular filtration rate (eGFR) test is done once a year to assess the level of chronic kidney disease (CKD), if present.  Lipid profile (cholesterol, HDL, LDL, triglycerides)  Performed every 5 years for most people.  The goal for LDL is less than 100 mg/dL. If you are at high risk, the goal is less than 70 mg/dL.  The goal for HDL is 40 mg/dL to 50 mg/dL for men and 50 mg/dL to 60 mg/dL for women. An HDL cholesterol of 60 mg/dL or higher gives some protection against heart disease.  The goal for triglycerides is less than 150 mg/dL.  Influenza vaccine, pneumococcal vaccine, and hepatitis B vaccine  The influenza vaccine is recommended yearly.  The pneumococcal vaccine is generally given once in a lifetime. However, there are some instances when another vaccination is recommended. Check with your health care provider.  The hepatitis B vaccine is also recommended for adults with diabetes.    Diabetes self-management education  Education is recommended at diagnosis and ongoing as needed.  Treatment plan  Your treatment plan is reviewed at every medical visit.  Document Released: 05/13/2009 Document Revised: 03/18/2013 Document Reviewed: 12/16/2012 ExitCare Patient Information 2014 [employer].   

## 2015-04-25 NOTE — Telephone Encounter (Signed)
Likely she has volume overload from severe renal disease.   If she is not diuresing at her current dose I would recommend either doubling her dose of lasix (40 mg twice daily) or changing to 20 mg toresemide twice daily.   If she is diuresing well we should add a third dose and ask her to follow up with nephro this week.   We should also collect a bmp while she is here  [name], MD [facility] 04/25/2015, 12:48 PM

## 2015-04-25 NOTE — Progress Notes (Signed)
Subjective:    Kara Knapp is a 44 y.o. female who presents for an initial evaluation of Type 2 diabetes mellitus.  She was initially diagnosed with type 2 DM in 2001.  Patient is failry new to our practice and has been coming here about 2 months now.  Current symptoms/problems include foot ulcerations, hyperglycemia and hypoglycemia and paresthesia of the feet and have been improving. Symptoms have been present for 2 months.   Known diabetic complications: nephropathy, retinopathy and peripheral neuropathy Cardiovascular risk factors: diabetes mellitus, dyslipidemia, family history of premature cardiovascular disease, hypertension, microalbuminuria and smoking/ tobacco exposure  Patient is due to have shunt placed 04/28/2015 for possible dialysis.  Also 05/02/2015 she is due to have eye surgery by Dr Rankin. Current diabetic medications include Lantus - patient takes 15 units at night except if BG is less than 150 and then she will take Novolog instead.  She is also taking Humalog 7 units prior to meal up to qid..   Eye exam current (within one year): yes Weight trend: increasing steadily - increased by 4# over last 10 days.  We were contacted by home health agency that patient's weight has increased by about 7#.  Only trace peripheral edema.   Occassioan SOB.  Patient states she is urinating regularly.  Prior visit with dietician: no Current diet: in general, an "unhealthy" diet, but patient is trying to improve - limiting sugar and high salt foods Current exercise: none  Current monitoring regimen: check BG bid to tid.  ranges from 77 to 302  Is She on ACE inhibitor or angiotensin II receptor blocker?  No, patient has severe - stage IV CKD.    The following portions of the patient's history were reviewed and updated as appropriate: allergies, current medications, past family history, past medical history, past social history, past surgical history and problem list.   Objective:     BP 140/88 mmHg  Pulse 70  Temp(Src) 98.5 F (36.9 C)  Ht 5' 3" (1.6 m)  Wt 185 lb (83.915 kg)  BMI 32.78 kg/m2  Lab Review GLUCOSE, BLD (mg/dL)  Date Value  02/17/2015 224*  01/29/2015 226*  01/23/2015 127*   CO2 (mmol/L)  Date Value  02/17/2015 23  01/29/2015 22  01/23/2015 22   BUN (mg/dL)  Date Value  02/17/2015 62*  01/29/2015 46*  01/23/2015 40*   CREATININE, SER (mg/dL)  Date Value  02/17/2015 3.98*  01/29/2015 3.56*  01/23/2015 3.41*   Last A1c = 8.7% (03/01/2015) Assessment:    Diabetes Mellitus type II, under inadequate control.    Plan:    1.  Rx changes: change Lantus to 15 units each morning.  Only hold if BG is less than 100.    Change Humalog to 7 units prior to each meal.  May add 2 additional units if BG is >250 2.  Education: Reviewed 'ABCs' of diabetes management (respective goals in parentheses):  A1C (<7), blood pressure (<130/80), and cholesterol (LDL <100). 3.  Compliance at present is estimated to be fair. Efforts to improve compliance (if necessary) will be directed at dietary modifications: discussed CHO counting principles.  As patient cannot easily see food labels spent extra time on how to visually estimate serving sizes and CHO amounts, increased exercise and regular blood sugar monitoring: up to tid times daily. 4. Follow up: 8 weeks    Orders Placed This Encounter  Procedures  . BMP8+EGFR   Tammy Eckard, PharmD, CPP  

## 2015-04-25 NOTE — Telephone Encounter (Signed)
Per [facility], pt has steadily gained wt over last week. Today she is 183.4, last Monday she was 176. She is sleeping with 3 pillows under head, slight increase of SOB. Pt will be in our office at 2pm today for an appt with Tammy. Please advise.

## 2015-04-26 LAB — BMP8+EGFR
BUN / CREAT RATIO: 11 (ref 9–23)
BUN: 40 mg/dL — ABNORMAL HIGH (ref 6–24)
CHLORIDE: 102 mmol/L (ref 97–108)
CO2: 19 mmol/L (ref 18–29)
Calcium: 8.8 mg/dL (ref 8.7–10.2)
Creatinine, Ser: 3.61 mg/dL (ref 0.57–1.00)
GFR, EST AFRICAN AMERICAN: 17 mL/min/{1.73_m2} — AB (ref >59)
GFR, EST NON AFRICAN AMERICAN: 14 mL/min/{1.73_m2} — AB (ref >59)
Glucose: 197 mg/dL — ABNORMAL HIGH (ref 65–99)
Potassium: 5.8 mmol/L — ABNORMAL HIGH (ref 3.5–5.2)
Sodium: 137 mmol/L (ref 134–144)

## 2015-04-27 ENCOUNTER — Other Ambulatory Visit: Payer: Self-pay | Admitting: Pharmacist

## 2015-04-27 DIAGNOSIS — E875 Hyperkalemia: Secondary | ICD-10-CM

## 2015-04-28 ENCOUNTER — Encounter (HOSPITAL_COMMUNITY)
Admission: RE | Disposition: A | Discharge status: Home / Self Care | Payer: Self-pay | Source: Ambulatory Visit | Attending: Vascular Surgery

## 2015-04-28 ENCOUNTER — Ambulatory Visit (HOSPITAL_COMMUNITY)
Admission: RE | Admit: 2015-04-28 | Discharge: 2015-04-28 | Disposition: A | Discharge status: Home / Self Care | Payer: Medicaid Other | Source: Ambulatory Visit | Attending: Vascular Surgery | Admitting: Vascular Surgery

## 2015-04-28 DIAGNOSIS — F17210 Nicotine dependence, cigarettes, uncomplicated: Secondary | ICD-10-CM | POA: Diagnosis not present

## 2015-04-28 DIAGNOSIS — E785 Hyperlipidemia, unspecified: Secondary | ICD-10-CM | POA: Diagnosis not present

## 2015-04-28 DIAGNOSIS — J45909 Unspecified asthma, uncomplicated: Secondary | ICD-10-CM | POA: Diagnosis not present

## 2015-04-28 DIAGNOSIS — Z992 Dependence on renal dialysis: Secondary | ICD-10-CM

## 2015-04-28 DIAGNOSIS — I129 Hypertensive chronic kidney disease with stage 1 through stage 4 chronic kidney disease, or unspecified chronic kidney disease: Secondary | ICD-10-CM | POA: Diagnosis not present

## 2015-04-28 DIAGNOSIS — E1140 Type 2 diabetes mellitus with diabetic neuropathy, unspecified: Secondary | ICD-10-CM | POA: Insufficient documentation

## 2015-04-28 DIAGNOSIS — Z8249 Family history of ischemic heart disease and other diseases of the circulatory system: Secondary | ICD-10-CM | POA: Insufficient documentation

## 2015-04-28 DIAGNOSIS — I509 Heart failure, unspecified: Secondary | ICD-10-CM | POA: Diagnosis not present

## 2015-04-28 DIAGNOSIS — Z882 Allergy status to sulfonamides status: Secondary | ICD-10-CM | POA: Insufficient documentation

## 2015-04-28 DIAGNOSIS — E11319 Type 2 diabetes mellitus with unspecified diabetic retinopathy without macular edema: Secondary | ICD-10-CM | POA: Insufficient documentation

## 2015-04-28 DIAGNOSIS — E1122 Type 2 diabetes mellitus with diabetic chronic kidney disease: Secondary | ICD-10-CM | POA: Insufficient documentation

## 2015-04-28 DIAGNOSIS — N184 Chronic kidney disease, stage 4 (severe): Secondary | ICD-10-CM | POA: Insufficient documentation

## 2015-04-28 DIAGNOSIS — N186 End stage renal disease: Secondary | ICD-10-CM | POA: Diagnosis present

## 2015-04-28 DIAGNOSIS — F329 Major depressive disorder, single episode, unspecified: Secondary | ICD-10-CM | POA: Insufficient documentation

## 2015-04-28 DIAGNOSIS — N185 Chronic kidney disease, stage 5: Secondary | ICD-10-CM | POA: Diagnosis not present

## 2015-04-28 HISTORY — PX: PERIPHERAL VASCULAR CATHETERIZATION: SHX172C

## 2015-04-28 LAB — GLUCOSE, CAPILLARY
GLUCOSE-CAPILLARY: 50 mg/dL — AB (ref 65–99)
GLUCOSE-CAPILLARY: 95 mg/dL (ref 65–99)

## 2015-04-28 LAB — POCT I-STAT, CHEM 8
BUN: 63 mg/dL — ABNORMAL HIGH (ref 6–20)
CALCIUM ION: 1.24 mmol/L — AB (ref 1.12–1.23)
CHLORIDE: 110 mmol/L (ref 101–111)
Creatinine, Ser: 4.2 mg/dL — ABNORMAL HIGH (ref 0.44–1.00)
Glucose, Bld: 55 mg/dL — ABNORMAL LOW (ref 65–99)
HCT: 35 % — ABNORMAL LOW (ref 36.0–46.0)
HEMOGLOBIN: 11.9 g/dL — AB (ref 12.0–15.0)
Potassium: 5.4 mmol/L — ABNORMAL HIGH (ref 3.5–5.1)
SODIUM: 137 mmol/L (ref 135–145)
TCO2: 19 mmol/L (ref 0–100)

## 2015-04-28 SURGERY — UPPER EXTREMITY VENOGRAPHY
Anesthesia: LOCAL

## 2015-04-28 MED ORDER — SODIUM CHLORIDE 0.9 % IJ SOLN: 3.0000 mL | Freq: Two times a day (BID) | INTRAMUSCULAR | Status: DC

## 2015-04-28 MED ORDER — SODIUM CHLORIDE 0.9 % IV SOLN: 1.0000 mL/kg/h | INTRAVENOUS | Status: DC

## 2015-04-28 MED ORDER — SODIUM CHLORIDE 0.9 % IJ SOLN: 3.0000 mL | INTRAMUSCULAR | Status: DC | PRN

## 2015-04-28 MED ORDER — IODIXANOL 320 MG/ML IV SOLN
INTRAVENOUS | Status: DC | PRN
  Administered 2015-04-28: 40 mL via INTRAVENOUS

## 2015-04-28 MED ORDER — DEXTROSE 50 % IV SOLN
INTRAVENOUS | Status: DC | PRN
  Administered 2015-04-28: 25 mL via INTRAVENOUS

## 2015-04-28 MED ORDER — ACETAMINOPHEN 325 MG PO TABS: 650.0000 mg | ORAL_TABLET | ORAL | Status: DC | PRN

## 2015-04-28 MED ORDER — SODIUM CHLORIDE 0.9 % IV SOLN: 250.0000 mL | INTRAVENOUS | Status: DC | PRN

## 2015-04-28 MED ORDER — DEXTROSE 50 % IV SOLN
INTRAVENOUS | Status: AC
  Filled 2015-04-28: qty 50

## 2015-04-28 SURGICAL SUPPLY — 2 items
STOPCOCK MORSE 400PSI 3WAY (MISCELLANEOUS) ×4 IMPLANT
TUBING CIL FLEX 10 FLL-RA (TUBING) ×4 IMPLANT

## 2015-04-28 NOTE — Interval H&P Note (Signed)
History and Physical Interval Note:  04/28/2015 7:21 AM  Kara Knapp  has presented today for surgery, with the diagnosis of Neck Edema, dialysis patient  The various methods of treatment have been discussed with the patient and family. After consideration of risks, benefits and other options for treatment, the patient has consented to  Procedure(s): Bilateral Upper Extremity Venography (N/A) as a surgical intervention .  The patient's history has been reviewed, patient examined, no change in status, stable for surgery.  I have reviewed the patient's chart and labs.  Questions were answered to the patient's satisfaction.     [name]

## 2015-04-28 NOTE — Discharge Instructions (Signed)
Venogram, Care After °Refer to this sheet in the next few weeks. These instructions provide you with information on caring for yourself after your procedure. Your health care provider may also give you more specific instructions. Your treatment has been planned according to current medical practices, but problems sometimes occur. Call your health care provider if you have any problems or questions after your procedure. °WHAT TO EXPECT AFTER THE PROCEDURE °After your procedure, it is typical to have the following sensations: °· Mild discomfort at the catheter insertion site. °HOME CARE INSTRUCTIONS  °· Take all medicines exactly as directed. °· Follow any prescribed diet. °· Follow instructions regarding both rest and physical activity. °· Drink more fluids for the first several days after the procedure in order to help flush dye from your kidneys. °SEEK MEDICAL CARE IF: °· You develop a rash. °· You have fever not controlled by medicine. °SEEK IMMEDIATE MEDICAL CARE IF: °· There is pain, drainage, bleeding, redness, swelling, warmth or a red streak at the site of the IV tube. °· The extremity where your IV tube was placed becomes discolored, numb, or cool. °· You have difficulty breathing or shortness of breath. °· You develop chest pain. °· You have excessive dizziness or fainting. °Document Released: 05/06/2013 Document Revised: 07/21/2013 Document Reviewed: 05/06/2013 °ExitCare® Patient Information ©2015 [employer]. This information is not intended to replace advice given to you by your health care provider. Make sure you discuss any questions you have with your health care provider. ° °

## 2015-04-28 NOTE — H&P (View-Only) (Signed)
Referred by:  Timmothy Euler, MD [place], [place] [address]  Reason for referral: New access  History of Present Illness  Kara Knapp is a 44 y.o. (1971/03/30) female who presents for evaluation for permanent access.  The patient is right  hand dominant.  The patient has not had previous access procedures.  Previous central venous cannulation procedures include: none.  The patient has never had a PPM placed.   Past Medical History  Diagnosis Date  . Insulin-dependent diabetes mellitus with retinopathy   . Neuropathy   . Asthma   . Hypertension   . Osteomyelitis     left middle toe   . Cataract   . Bronchitis   . Pneumonia   . Depression   . Neuropathy due to secondary diabetes mellitus   . Hyperlipidemia   . Fibroids   . Anemia of chronic disease   . Chronic kidney disease     Stage III-IV  . CHF (congestive heart failure)     Presumed diastolic  . Cholecystitis, acute 05/26/2013    Status post cholecystectomy  . Tobacco abuse   . Fibroid, uterine   . CHF (congestive heart failure)   . Kidney disease     Past Surgical History  Procedure Laterality Date  . Cesarean section    . Tubal ligation    . Cholecystectomy N/A 05/25/2013    Procedure: LAPAROSCOPIC CHOLECYSTECTOMY;  Surgeon: Jamesetta So, MD;  Location: [facility];  Service: General;  Laterality: N/A;  . Eye surgery    . Pars plana vitrectomy Left 11/24/2014    Procedure: PARS PLANA VITRECTOMY WITH 25 GAUGE;  Surgeon: Hurman Horn, MD;  Location: [facility];  Service: Ophthalmology;  Laterality: Left;  . Photocoagulation with laser Left 11/24/2014    Procedure: PHOTOCOAGULATION WITH LASER;  Surgeon: Hurman Horn, MD;  Location: [facility];  Service: Ophthalmology;  Laterality: Left;  with insertion of silicone oil    Social History   Social History  . Marital Status: Legally Separated    Spouse Name: N/A  . Number of Children: N/A  . Years of Education: N/A   Occupational History  . Not on  file.   Social History Main Topics  . Smoking status: Current Every Day Smoker -- 1.00 packs/day for 20 years    Types: Cigarettes  . Smokeless tobacco: Never Used  . Alcohol Use: No  . Drug Use: Yes  . Sexual Activity: Not Currently    Birth Control/ Protection: Surgical   Other Topics Concern  . Not on file   Social History Narrative    Family History  Problem Relation Age of Onset  . COPD Mother   . Cancer Father   . Diabetes Sister   . Deep vein thrombosis Sister   . Diabetes Brother   . Hyperlipidemia Brother   . Hypertension Brother   . Mental retardation Sister     Current Outpatient Prescriptions  Medication Sig Dispense Refill  . ACCU-CHEK AVIVA PLUS test strip 1 Units by Other route 4 (four) times daily.  11  . albuterol (PROVENTIL HFA;VENTOLIN HFA) 108 (90 BASE) MCG/ACT inhaler Inhale 2 puffs into the lungs every 4 (four) hours as needed for wheezing or shortness of breath. 1 Inhaler 1  . amLODipine (NORVASC) 5 MG tablet     . B Complex-C-Folic Acid (B COMPLEX-VITAMIN C-FOLIC ACID) 1 MG tablet Take 1 tablet by mouth daily with breakfast. 30 tablet 6  . brimonidine-timolol (  COMBIGAN) 0.2-0.5 % ophthalmic solution Place 1 drop into the right eye every 12 (twelve) hours.     . carvedilol (COREG) 3.125 MG tablet     . carvedilol (COREG) 6.25 MG tablet Take 1 tablet (6.25 mg total) by mouth 2 (two) times daily with a meal. Blood pressure medication. 60 tablet 2  . citalopram (CELEXA) 20 MG tablet Take 20 mg by mouth daily.  2  . cyclobenzaprine (FLEXERIL) 10 MG tablet Take 1 tablet (10 mg total) by mouth 3 (three) times daily as needed for muscle spasms. 10 tablet 0  . doxycycline (VIBRA-TABS) 100 MG tablet     . furosemide (LASIX) 40 MG tablet Take 1 tablet (40 mg total) by mouth 2 (two) times daily. 60 tablet 3  . gabapentin (NEURONTIN) 300 MG capsule Take 1 capsule (300 mg total) by mouth 3 (three) times daily. 90 capsule 2  . HUMALOG KWIKPEN 100 UNIT/ML KiwkPen  Inject 5 Units into the skin 4 (four) times daily -  with meals and at bedtime.  0  . hydrALAZINE (APRESOLINE) 25 MG tablet Take 25 mg by mouth 3 (three) times daily.     Marland Kitchen LANTUS SOLOSTAR 100 UNIT/ML Solostar Pen INJECT 15 UNITS AT BEDTIME  1  . medroxyPROGESTERone (PROVERA) 10 MG tablet Take 1 tablet (10 mg total) by mouth daily. 10 tablet 0  . pravastatin (PRAVACHOL) 40 MG tablet Take 40 mg by mouth daily.  0  . QC ALL DAY ALLERGY 10 MG tablet     . simvastatin (ZOCOR) 20 MG tablet      No current facility-administered medications for this visit.    Allergies  Allergen Reactions  . Bactrim [Sulfamethoxazole-Trimethoprim] Nausea And Vomiting  . Prednisone     "I was wide open and couldn't eat" per pt.     REVIEW OF SYSTEMS:  (Positives checked otherwise negative)  CARDIOVASCULAR:   [x]  chest pain,  [ ]  chest pressure,  [ ]  palpitations,  [x]  shortness of breath when laying flat,  [x]  shortness of breath with exertion,   [ ]  pain in feet when walking,  [x]  pain in feet when laying flat, [ ]  history of blood clot in veins (DVT),  [ ]  history of phlebitis,  [x]  swelling in legs,  [ ]  varicose veins  PULMONARY:   [ ]  productive cough,  [ ]  asthma,  [ ]  wheezing  NEUROLOGIC:   [x]  weakness in arms or legs,  [ ]  numbness in arms or legs,  [ ]  difficulty speaking or slurred speech,  [ ]  temporary loss of vision in one eye,  [ ]  dizziness  HEMATOLOGIC:   [ ]  bleeding problems,  [ ]  problems with blood clotting too easily  MUSCULOSKEL:   [ ]  joint pain, [ ]  joint swelling  GASTROINTEST:   [ ]  vomiting blood,  [ ]  blood in stool     GENITOURINARY:   [ ]  burning with urination,  [ ]  blood in urine  PSYCHIATRIC:   [x]  history of major depression  INTEGUMENTARY:   [ ]  rashes,  [ ]  ulcers  CONSTITUTIONAL:   [ ]  fever,  [ ]  chills   Physical Examination  Filed Vitals:   04/15/15 1009 04/15/15 1012  BP: 189/109 109/104  Pulse: 69   Height: 5' 3"  (1.6  m)   Weight: 181 lb (82.101 kg)   SpO2: 99%    Body mass index is 32.07 kg/(m^2).  General: A&O x 3, WDWN  Head: [place]/AT  Ear/Nose/Throat: Hearing grossly intact, nares w/o erythema or drainage, oropharynx w/o Erythema/Exudate, Mallampati score: 3  Eyes: PERRLA, EOMI  Neck: Supple, no nuchal rigidity, no palpable LAD  Pulmonary: Sym exp, good air movt, CTAB, no rales, rhonchi, & wheezing  Cardiac: RRR, Nl S1, S2, no Murmurs, rubs or gallops  Vascular: Vessel Right Left  Radial Palpable Palpable  Ulnar Palpable Palpable  Brachial Palpable Palpable  Carotid Palpable, without bruit Palpable, without bruit  Aorta Not palpable N/A  Femoral Palpable Palpable  Popliteal Not palpable Not palpable  PT Palpable Palpable  DP Palpable Palpable   Gastrointestinal: soft, NTND, -G/R, - HSM, - masses, - CVAT B  Musculoskeletal: M/S 5/5 throughout , Extremities without ischemic changes   Neurologic: CN 2-12 intact , Pain and light touch intact in extremities , Motor exam as listed above  Psychiatric: Judgment intact, Mood & affect appropriate for pt's clinical situation  Dermatologic: See M/S exam for extremity exam, no rashes otherwise noted  Lymph : No Cervical, Axillary, or Inguinal lymphadenopathy   Non-Invasive Vascular Imaging  Vein Mapping  (Date: 04/15/2015):   R arm: acceptable vein conduits include none  L arm: acceptable vein conduits include none  BUE Doppler (Date: 04/15/2015):   R arm:   Brachial: tri,4.6 mm  Radial: tri,2.7 mm  Ulnar: tri,2.8 mm  L arm:   Brachial: tri,5.0 mm  Radial: tri,2.5 mm  Ulnar: tri,3.2 mm   Outside Studies/Documentation 5 pages of outside documents were reviewed including: outpatient nephrology chart.   Medical Decision Making  Kara Knapp is a 44 y.o. female who presents with ESRD requiring hemodialysis.    There are no obvious fistula options as even on sonosite evaluation, I can't find a vein big enough.  I  generally proceed with B arm venogram to try to determine any hidden fistula options and to interrogate the proximal vein for a large enough target.  She will be schedule for such on 26 SEP 16.   [name], MD [facility] Office: [phone] Pager: [phone]  04/15/2015, 1:04 PM

## 2015-04-29 ENCOUNTER — Encounter (HOSPITAL_COMMUNITY): Payer: Self-pay | Admitting: Vascular Surgery

## 2015-04-29 ENCOUNTER — Ambulatory Visit: Payer: Medicaid Other | Admitting: Obstetrics and Gynecology

## 2015-05-03 ENCOUNTER — Ambulatory Visit: Payer: Medicaid Other | Admitting: Obstetrics and Gynecology

## 2015-05-03 ENCOUNTER — Telehealth: Payer: Self-pay | Admitting: *Deleted

## 2015-05-03 NOTE — Telephone Encounter (Signed)
WIll ask her to follo wup within a week.   [name], MD [facility] 05/03/2015, 10:51 AM

## 2015-05-03 NOTE — Telephone Encounter (Signed)
Per Home health nurse, Angie, pt continues to gain wt. 04/18/15 wt was 176.2. 04/25/15 wt was 183.4, today wt is 186. BP 150/90 today, she is taking an extra lasix at noon about everyday. Ankles are no bigger and no increased SOB

## 2015-05-04 ENCOUNTER — Encounter: Payer: Self-pay | Admitting: Nephrology

## 2015-05-05 ENCOUNTER — Telehealth: Payer: Self-pay | Admitting: Pharmacist

## 2015-05-05 NOTE — Telephone Encounter (Signed)
Patient states that her home health nurse Tye Maryland came by today and told patient that she had boils.  Patient has been putting triple antibiotic cream on them BID.    Tye Maryland was suppose to contact our office about condition and maybe getting prescription.  Patient has been putting triple antibiotic cream on them BID.   Patient has appt Wed, October 12th.  Should appt be moved up or a stronger treatment advised?

## 2015-05-05 NOTE — Telephone Encounter (Signed)
Stp and offered appt but pt states she can't come because she needs 3 days notice for the [employer] bus, so she will continue with the antibiotic ointment and come in on Wednesday.

## 2015-05-06 ENCOUNTER — Telehealth: Payer: Self-pay | Admitting: *Deleted

## 2015-05-06 NOTE — Telephone Encounter (Signed)
[facility] knows to keep topical tx going and pt already has an appt this coming Wednesday

## 2015-05-06 NOTE — Telephone Encounter (Signed)
Home health nurse called and reported pt has a cluster of what looks like 4 small boils on buttocks. No pain, but itches. What you like to do anything or just monitor?

## 2015-05-06 NOTE — Telephone Encounter (Signed)
Continue topical treatment and come in to be seen.   [name], MD [facility] 05/06/2015, 1:46 PM

## 2015-05-09 ENCOUNTER — Ambulatory Visit (INDEPENDENT_AMBULATORY_CARE_PROVIDER_SITE_OTHER): Payer: Medicaid Other | Admitting: Obstetrics and Gynecology

## 2015-05-09 ENCOUNTER — Encounter: Payer: Self-pay | Admitting: Obstetrics and Gynecology

## 2015-05-09 DIAGNOSIS — N921 Excessive and frequent menstruation with irregular cycle: Secondary | ICD-10-CM | POA: Diagnosis not present

## 2015-05-09 DIAGNOSIS — D251 Intramural leiomyoma of uterus: Secondary | ICD-10-CM | POA: Diagnosis not present

## 2015-05-09 NOTE — Progress Notes (Signed)
Patient ID: Kara Knapp, female   DOB: 10-12-1970, 45 y.o.   MRN: [id number]     [facility] Visit  Patient name: Kara Knapp MRN [id number]  Date of birth: August 06, 1970  CC & HPI:  Kara Knapp is a 44 y.o. female presenting today for follow-up for discussion of Kara Knapp Transvaginal and Pelvic done on 7/14, indicated by menorrhagia and suspected fibroid uterus. Since her last visit, pt reports some mild abdominal pain that occurs after intercourse. She also notes 1 menses that was lighter than normal. Her Kara Knapp on 7/14 shows uterine enlargement with multiple fibroids up to 6 cm diameter.   [facility] and was told upon physical exam she may have fibroids. She was seen here on 6/27 for the same and complained of irregular, heavy menses that last 4-5 days. The plan on 7/11 was for pt to have Kara Knapp and IUD placed. Her periods usually occur monthly, but have been less frequent in the last 5 months. She has not followed up for IUD insertion.   ROS:  A complete 10 system review of systems was obtained and all systems are negative except as noted in the HPI and PMH.   Pertinent History Reviewed:   Reviewed. Medical         Past Medical History  Diagnosis Date  . Insulin-dependent diabetes mellitus with retinopathy ([facility])   . Neuropathy ([facility])   . Asthma   . Hypertension   . Osteomyelitis ([facility])     left middle toe   . Cataract   . Bronchitis   . Pneumonia   . Depression   . Neuropathy due to secondary diabetes mellitus ([place])   . Hyperlipidemia   . Fibroids   . Anemia of chronic disease   . Chronic kidney disease     Stage III-IV  . CHF (congestive heart failure) ([facility])     Presumed diastolic  . Cholecystitis, acute 05/26/2013    Status post cholecystectomy  . Tobacco abuse   . Fibroid, uterine   . CHF (congestive heart failure) ([facility])   . Kidney disease   . CHF (congestive heart failure) ([facility])   . Kidney failure                          Surgical Hx:    Past Surgical History  Procedure Laterality Date  . Cesarean section    . Tubal ligation    . Cholecystectomy N/A 05/25/2013    Procedure: LAPAROSCOPIC CHOLECYSTECTOMY;  Surgeon: Jamesetta So, MD;  Location: [facility];  Service: General;  Laterality: N/A;  . Eye surgery    . Pars plana vitrectomy Left 11/24/2014    Procedure: PARS PLANA VITRECTOMY WITH 25 GAUGE;  Surgeon: Hurman Horn, MD;  Location: [facility];  Service: Ophthalmology;  Laterality: Left;  . Photocoagulation with laser Left 11/24/2014    Procedure: PHOTOCOAGULATION WITH LASER;  Surgeon: Hurman Horn, MD;  Location: [facility];  Service: Ophthalmology;  Laterality: Left;  with insertion of silicone oil  . Peripheral vascular catheterization N/A 04/28/2015    Procedure: Bilateral Upper Extremity Venography;  Surgeon: Conrad Clarkton, MD;  Location: [facility];  Service: Cardiovascular;  Laterality: N/A;   Medications: Reviewed & Updated - see associated section                       Current outpatient  prescriptions:  .  ACCU-CHEK AVIVA PLUS test strip, 1 Units by Other route 4 (four) times daily., Disp: , Rfl: 11 .  albuterol (PROVENTIL HFA;VENTOLIN HFA) 108 (90 BASE) MCG/ACT inhaler, Inhale 2 puffs into the lungs every 4 (four) hours as needed for wheezing or shortness of breath., Disp: 1 Inhaler, Rfl: 1 .  B Complex-C-Folic Acid (B COMPLEX-VITAMIN C-FOLIC ACID) 1 MG tablet, Take 1 tablet by mouth daily with breakfast., Disp: 30 tablet, Rfl: 6 .  carvedilol (COREG) 6.25 MG tablet, Take 1 tablet (6.25 mg total) by mouth 2 (two) times daily with a meal. Blood pressure medication., Disp: 60 tablet, Rfl: 2 .  citalopram (CELEXA) 20 MG tablet, Take 20 mg by mouth daily., Disp: , Rfl: 2 .  furosemide (LASIX) 40 MG tablet, Take 40 mg by mouth 2 (two) times daily., Disp: , Rfl: 3 .  gabapentin (NEURONTIN) 300 MG capsule, Take 1 capsule (300 mg total) by mouth 3 (three) times daily., Disp: 90 capsule,  Rfl: 2 .  hydrALAZINE (APRESOLINE) 25 MG tablet, Take 25 mg by mouth 3 (three) times daily. , Disp: , Rfl:  .  insulin lispro (HUMALOG KWIKPEN) 100 UNIT/ML KiwkPen, Inject 0.07-0.1 mLs (7-10 Units total) into the skin 3 (three) times daily., Disp: 15 mL, Rfl: 1 .  LANTUS SOLOSTAR 100 UNIT/ML Solostar Pen, INJECT 15 UNITS AT BEDTIME (Patient taking differently: Inject 15 Units into the skin every morning. ), Disp: 15 mL, Rfl: 1 .  simvastatin (ZOCOR) 20 MG tablet, Take 1 tablet (20 mg total) by mouth daily at 6 PM., Disp: 34 tablet, Rfl: 1   Social History: Reviewed -  reports that she has been smoking Cigarettes.  She has a 20 pack-year smoking history. She has never used smokeless tobacco.  Objective Findings:  Vitals: There were no vitals taken for this visit.  Physical Examination: General appearance - alert, well appearing, and in no distress and oriented to person, place, and time Mental status - alert, oriented to person, place, and time, normal mood, behavior, speech, dress, motor activity, and thought processes  Discussion only 20 minutes  Assessment & Plan:   A:  1. Uterine enlargement with multiple fibroids up to 6 cm in diameter 2. Irregular menses, oligomenorrhea P:  1. Pt to use Motrin/Tylenol for post-coital pain 2. Endometrial biopsy indicated with increased frequency of menses 3. Will not continue with IUD insertion due to improved menorrhagia 4. Pt to follow-up in 1 year for follow-up, next pap due in 3 years   By signing my name below, I, Tula Nakayama, attest that this documentation has been prepared under the direction and in the presence of Mallory Shirk, MD.  Electronically Signed: Tula Nakayama, ED Scribe. 05/09/2015. 9:58 AM.  I personally performed the services described in this documentation, which was SCRIBED in my presence. The recorded information has been reviewed and considered accurate. It has been edited as necessary during review. Jonnie Kind, MD

## 2015-05-11 ENCOUNTER — Ambulatory Visit (INDEPENDENT_AMBULATORY_CARE_PROVIDER_SITE_OTHER): Payer: Medicaid Other | Admitting: Family Medicine

## 2015-05-11 ENCOUNTER — Encounter: Payer: Self-pay | Admitting: Family Medicine

## 2015-05-11 VITALS — BP 163/86 | HR 63 | Temp 96.8°F | Ht 63.0 in | Wt 185.0 lb

## 2015-05-11 DIAGNOSIS — Z23 Encounter for immunization: Secondary | ICD-10-CM

## 2015-05-11 DIAGNOSIS — L089 Local infection of the skin and subcutaneous tissue, unspecified: Secondary | ICD-10-CM | POA: Diagnosis not present

## 2015-05-11 MED ORDER — DOXYCYCLINE HYCLATE 100 MG PO TABS: 100.0000 mg | ORAL_TABLET | Freq: Two times a day (BID) | ORAL | Status: DC

## 2015-05-11 MED ORDER — TRAMADOL HCL 50 MG PO TABS: 50.0000 mg | ORAL_TABLET | Freq: Two times a day (BID) | ORAL | Status: DC | PRN

## 2015-05-11 NOTE — Patient Instructions (Addendum)
   Great to see you! If the boils appear again lets try to see them sooner in the course, Now they appear to be infections but not necessarily an abscess that we can lance.   Take the antiibiotics Try tramadol for pain, no more than every 12 hours  Come back to see Tammy in 1 month for diabetes

## 2015-05-11 NOTE — Progress Notes (Signed)
HPI  Patient presents today for further evaluation of sores on her buttock and weight gain.   Sores They've been there for about 2 weeks now. She states that she had an initial large sore which popped and spread around with some liquid that she could not appreciate due to her decreased vision. Then she developed several other source similar to the first one. She states others 2 on the left buttock that are very tender and difficult to sit on. She's tried ibuprofen for the pain with no relief. She's tried Neosporin on the lesions. She describes them as boils, she has previously had them on her Pyridium, mouth, and eye She's been treated with antibiotics successfully with them before and has had to have one lanced before.  Weight gain Her weight has fluctuated between 181 pounds in 187 pounds over the last 2 months. She has gone from 181 in our office on August 25 to 185 today She has chronic kidney disease stage V and is likely going to require dialysis so her fluid status with a concern. She denies  new leg edema.  PMH: Smoking status noted ROS: Per HPI  Objective: BP 163/86 mmHg  Pulse 63  Temp(Src) 96.8 F (36 C) (Oral)  Ht 5' 3"  (1.6 m)  Wt 185 lb (83.915 kg)  BMI 32.78 kg/m2 Gen: NAD, alert, cooperative with exam HEENT: NCAT CV: RRR, good S1/S2, no murmur Resp: CTABL, no wheezes, non-labored Ext: No edema, warm Neuro: Alert and oriented, No gross deficits Skin: Buttock examined with 10 separate lesions, 2 large lesions with approximately 2-3 cm circular area of induration each on the left buttock, and has a 1 cm circular erythematous lesion with a half centimeter circular heme crusted area on the top- are both larger lesions in the left buttock, Other lesions are less large, and range from erythema only to the erythema with heme crusting No vesicular appearance Lesions in the left buttock that are larger are tender to palpation without any fluctuance or  warmth.  Assessment and plan:  # Buttock lesions, skin infection Treatment doxycycline I am suspicious of a herpetic outbreak which then got secondarily infected, however this is very unclear at this time. If these recur would appreciate it if she presented quickly so that we could do a herpes culture orders or Tzank prep test specifically for herpes Tramadol for pain renally dosed  # Weight gain The previously normal weight fluctuation, she is up 4 pounds since her last visit according to our scale No signs of pulmonary edema or peripheral edema that has worsened. The most likely thing is that she may have volume increase due to her severe kidney disease  # CKD stage V Following up with nephrology regularly, has a visit in 3 weeks Continue lasix at usual dose Her potassium was elevated but trending down on her last check, approximately 13 days ago   Orders Placed This Encounter  Procedures  . Flu Vaccine QUAD 36+ mos IM    Meds ordered this encounter  Medications  . doxycycline (VIBRA-TABS) 100 MG tablet    Sig: Take 1 tablet (100 mg total) by mouth 2 (two) times daily. 1 po bid    Dispense:  20 tablet    Refill:  0  . traMADol (ULTRAM) 50 MG tablet    Sig: Take 1 tablet (50 mg total) by mouth every 12 (twelve) hours as needed.    Dispense:  30 tablet    Refill:  0  [name], MD [facility] 05/11/2015, 9:48 AM

## 2015-05-12 ENCOUNTER — Telehealth: Payer: Self-pay | Admitting: Family Medicine

## 2015-05-12 NOTE — Telephone Encounter (Signed)
Unclear what to sign

## 2015-05-14 ENCOUNTER — Other Ambulatory Visit: Payer: Self-pay | Admitting: Pharmacist

## 2015-05-14 MED ORDER — BLOOD GLUCOSE MONITOR KIT: PACK | Status: DC

## 2015-05-16 NOTE — Telephone Encounter (Signed)
Yes the one's from [facility].

## 2015-05-17 ENCOUNTER — Telehealth: Payer: Self-pay | Admitting: Family Medicine

## 2015-05-17 MED ORDER — TRIAMCINOLONE ACETONIDE 0.1 % EX CREA: 1.0000 "application " | TOPICAL_CREAM | Freq: Two times a day (BID) | CUTANEOUS | Status: DC

## 2015-05-17 NOTE — Telephone Encounter (Signed)
E-signed docs  [name], MD [facility] 05/17/2015, 1:01 PM

## 2015-05-17 NOTE — Telephone Encounter (Signed)
i have sent kenalog cream for itching.   [name], MD [facility] 05/17/2015, 2:55 PM

## 2015-05-19 ENCOUNTER — Ambulatory Visit: Payer: Self-pay | Admitting: Family Medicine

## 2015-05-30 ENCOUNTER — Ambulatory Visit (INDEPENDENT_AMBULATORY_CARE_PROVIDER_SITE_OTHER): Payer: Medicaid Other | Admitting: Family Medicine

## 2015-05-30 ENCOUNTER — Other Ambulatory Visit: Payer: Self-pay | Admitting: Pharmacist

## 2015-05-30 DIAGNOSIS — E11319 Type 2 diabetes mellitus with unspecified diabetic retinopathy without macular edema: Secondary | ICD-10-CM | POA: Diagnosis not present

## 2015-05-30 DIAGNOSIS — E11621 Type 2 diabetes mellitus with foot ulcer: Secondary | ICD-10-CM

## 2015-05-30 DIAGNOSIS — L97519 Non-pressure chronic ulcer of other part of right foot with unspecified severity: Secondary | ICD-10-CM

## 2015-05-30 DIAGNOSIS — Z794 Long term (current) use of insulin: Secondary | ICD-10-CM

## 2015-05-30 DIAGNOSIS — E1142 Type 2 diabetes mellitus with diabetic polyneuropathy: Secondary | ICD-10-CM

## 2015-06-01 ENCOUNTER — Other Ambulatory Visit: Payer: Self-pay

## 2015-06-01 MED ORDER — CARVEDILOL 6.25 MG PO TABS: 6.2500 mg | ORAL_TABLET | Freq: Two times a day (BID) | ORAL | Status: DC

## 2015-06-02 ENCOUNTER — Telehealth: Payer: Self-pay | Admitting: Family Medicine

## 2015-06-02 NOTE — Telephone Encounter (Signed)
Patient aware that she will need to be seen. Patient states she can not come in until Monday. Patient given an appointment. She was advised if she starts having any chest pain, sob she needs to get to ER.

## 2015-06-06 ENCOUNTER — Ambulatory Visit: Payer: Medicaid Other | Admitting: Family Medicine

## 2015-06-07 ENCOUNTER — Other Ambulatory Visit: Payer: Self-pay | Admitting: Pharmacist

## 2015-06-10 ENCOUNTER — Encounter: Payer: Self-pay | Admitting: Family Medicine

## 2015-06-10 ENCOUNTER — Ambulatory Visit (INDEPENDENT_AMBULATORY_CARE_PROVIDER_SITE_OTHER): Payer: Medicaid Other | Admitting: Family Medicine

## 2015-06-10 VITALS — BP 138/80 | HR 72 | Temp 97.5°F | Ht 63.0 in | Wt 190.4 lb

## 2015-06-10 DIAGNOSIS — N184 Chronic kidney disease, stage 4 (severe): Secondary | ICD-10-CM

## 2015-06-10 DIAGNOSIS — E1310 Other specified diabetes mellitus with ketoacidosis without coma: Secondary | ICD-10-CM

## 2015-06-10 DIAGNOSIS — N179 Acute kidney failure, unspecified: Secondary | ICD-10-CM | POA: Diagnosis not present

## 2015-06-10 DIAGNOSIS — N189 Chronic kidney disease, unspecified: Secondary | ICD-10-CM

## 2015-06-10 DIAGNOSIS — E1110 Type 2 diabetes mellitus with ketoacidosis without coma: Secondary | ICD-10-CM | POA: Insufficient documentation

## 2015-06-10 LAB — POCT GLYCOSYLATED HEMOGLOBIN (HGB A1C): HEMOGLOBIN A1C: 6.5

## 2015-06-10 NOTE — Assessment & Plan Note (Signed)
Patient's diabetes is much improved with an A1c coming back at 6.5. We will continue her current medications and diet and exercise and see back in 3 months, her insulin changes may vary as she goes on to dialysis.

## 2015-06-10 NOTE — Progress Notes (Signed)
BP 138/80 mmHg  Pulse 72  Temp(Src) 97.5 F (36.4 C) (Oral)  Ht 5' 3"  (1.6 m)  Wt 190 lb 6.4 oz (86.365 kg)  BMI 33.74 kg/m2  LMP 04/10/2015   Subjective:    Patient ID: Kara Knapp, female    DOB: 1970/12/04, 44 y.o.   MRN: [id number]  HPI: Kara Knapp is a 44 y.o. female presenting on 06/10/2015 for Hyperlipidemia; Diabetes; Bowel control; and Vomiting   HPI Shortness of breath and weight gain Patient has been having increased shortness of breath and weight gain with her renal failure and started back up in the fluid in her lungs. She denies fever or chills or productive cough. She has been having a cough that is dry. She denies any wheezing. Her shortness of breath she feels is more associated with her fluid. She has gained 9 pounds over the past few months. She had some nausea and reflux with it which she describes as vomiting but per her description it was more acid coming up.  Type 2 diabetes Patient has been seen Dr. Wendi Snipes for her diabetes but is having it rechecked today. Her A1c today is 6.5. She denies any hypoglycemic episodes. She is currently taking Lantus 15 units at bedtime and Humalog 7-10 units 3 times daily. She says her blood sugars been running a lot better with rarely having any blood sugars over 200  Relevant past medical, surgical, family and social history reviewed and updated as indicated. Interim medical history since our last visit reviewed. Allergies and medications reviewed and updated.  Review of Systems  Constitutional: Negative for fever and chills.  HENT: Negative for congestion, ear discharge and ear pain.   Eyes: Negative for redness and visual disturbance.  Respiratory: Positive for cough and shortness of breath. Negative for chest tightness and wheezing.   Cardiovascular: Positive for leg swelling. Negative for chest pain and palpitations.  Genitourinary: Negative for dysuria and difficulty urinating.  Musculoskeletal: Negative for  back pain and gait problem.  Skin: Negative for rash.  Neurological: Negative for dizziness, light-headedness and headaches.  Psychiatric/Behavioral: Negative for behavioral problems and agitation.  All other systems reviewed and are negative.   Per HPI unless specifically indicated above     Medication List       This list is accurate as of: 06/10/15 11:48 AM.  Always use your most recent med list.               ACCU-CHEK AVIVA PLUS test strip  Generic drug:  glucose blood  1 Units by Other route 4 (four) times daily.     albuterol 108 (90 BASE) MCG/ACT inhaler  Commonly known as:  PROVENTIL HFA;VENTOLIN HFA  Inhale 2 puffs into the lungs every 4 (four) hours as needed for wheezing or shortness of breath.     B complex-vitamin C-folic acid 1 MG tablet  Take 1 tablet by mouth daily with breakfast.     blood glucose meter kit and supplies Kit  Podigy Voice GLucometer - patient is visually impaired and needs meter that reads results.  Use to check BG up to QID.  Dx: E11.319 Z79.4     carvedilol 6.25 MG tablet  Commonly known as:  COREG  Take 1 tablet (6.25 mg total) by mouth 2 (two) times daily with a meal. Blood pressure medication.     citalopram 20 MG tablet  Commonly known as:  CELEXA  Take 20 mg by mouth daily.     furosemide  40 MG tablet  Commonly known as:  LASIX  Take 80 mg by mouth daily.     gabapentin 300 MG capsule  Commonly known as:  NEURONTIN  Take 1 capsule (300 mg total) by mouth 3 (three) times daily.     HUMALOG KWIKPEN 100 UNIT/ML KiwkPen  Generic drug:  insulin lispro  INJECT 7-10 UNITS INTO THE SKIN THREE TIMES DAILY.     hydrALAZINE 25 MG tablet  Commonly known as:  APRESOLINE  Take 25 mg by mouth 3 (three) times daily.     LANTUS SOLOSTAR 100 UNIT/ML Solostar Pen  Generic drug:  Insulin Glargine  INJECT 15 UNITS AT BEDTIME     simvastatin 20 MG tablet  Commonly known as:  ZOCOR  Take 1 tablet (20 mg total) by mouth daily at 6 PM.       traMADol 50 MG tablet  Commonly known as:  ULTRAM  Take 1 tablet (50 mg total) by mouth every 12 (twelve) hours as needed.     triamcinolone cream 0.1 %  Commonly known as:  KENALOG  Apply 1 application topically 2 (two) times daily. Avoid face, groin, and under arms           Objective:    BP 138/80 mmHg  Pulse 72  Temp(Src) 97.5 F (36.4 C) (Oral)  Ht 5' 3"  (1.6 m)  Wt 190 lb 6.4 oz (86.365 kg)  BMI 33.74 kg/m2  LMP 04/10/2015  Wt Readings from Last 3 Encounters:  06/10/15 190 lb 6.4 oz (86.365 kg)  05/11/15 185 lb (83.915 kg)  04/28/15 180 lb (81.647 kg)    Physical Exam  Constitutional: She is oriented to person, place, and time. She appears well-developed and well-nourished. No distress.  Eyes: Conjunctivae and EOM are normal. Pupils are equal, round, and reactive to light.  Neck: Neck supple. No thyromegaly present.  Cardiovascular: Normal rate, regular rhythm, normal heart sounds and intact distal pulses.   No murmur heard. Pulmonary/Chest: Effort normal. No respiratory distress. She has no wheezes. She has rales (bibasilar). She exhibits no tenderness.  Musculoskeletal: Normal range of motion. She exhibits edema (2+ bilateral lower extremity). She exhibits no tenderness.  Lymphadenopathy:    She has no cervical adenopathy.  Neurological: She is alert and oriented to person, place, and time. Coordination normal.  Skin: Skin is warm and dry. No rash noted. She is not diaphoretic.  Psychiatric: She has a normal mood and affect. Her behavior is normal.  Nursing note and vitals reviewed.   Results for orders placed or performed in visit on 06/10/15  POCT glycosylated hemoglobin (Hb A1C)  Result Value Ref Range   Hemoglobin A1C 6.5       Assessment & Plan:   Problem List Items Addressed This Visit      Endocrine   Uncontrolled type 2 diabetes mellitus with ketoacidosis without coma ([place]) - Primary    Patient's diabetes is much improved with an A1c coming  back at 6.5. We will continue her current medications and diet and exercise and see back in 3 months, her insulin changes may vary as she goes on to dialysis.      Relevant Orders   POCT glycosylated hemoglobin (Hb A1C) (Completed)   Lipid panel   BMP8+EGFR   CBC with Differential/Platelet     Genitourinary   Chronic kidney disease, stage IV (severe) ([place])    Patient wants second opinion on dialysis and would rather have somebody in system so that we can follow  it easier we will refer to a new nephrologist down in [place]. Hold her to stay with her current one until she can get into the new one      Acute on chronic kidney failure ([place])    Instructed her to increase Lasix by 20 to help her breathing but also realizing that ultimately she is going to need dialysis.      Relevant Orders   Lipid panel   BMP8+EGFR   Ambulatory referral to Nephrology       Follow up plan: Return in about 3 weeks (around 07/01/2015), or if symptoms worsen or fail to improve.  Caryl Pina, MD [facility] 06/10/2015, 11:48 AM

## 2015-06-10 NOTE — Assessment & Plan Note (Signed)
Patient wants second opinion on dialysis and would rather have somebody in system so that we can follow it easier we will refer to a new nephrologist down in [place]. Hold her to stay with her current one until she can get into the new one

## 2015-06-10 NOTE — Assessment & Plan Note (Signed)
Instructed her to increase Lasix by 20 to help her breathing but also realizing that ultimately she is going to need dialysis.

## 2015-06-11 ENCOUNTER — Encounter (HOSPITAL_COMMUNITY): Payer: Self-pay | Admitting: Emergency Medicine

## 2015-06-11 ENCOUNTER — Emergency Department (HOSPITAL_COMMUNITY): Payer: Medicaid Other

## 2015-06-11 ENCOUNTER — Other Ambulatory Visit: Payer: Self-pay | Admitting: Family

## 2015-06-11 ENCOUNTER — Emergency Department (HOSPITAL_COMMUNITY)
Admission: EM | Admit: 2015-06-11 | Discharge: 2015-06-11 | Disposition: A | Discharge status: Home / Self Care | Payer: Medicaid Other | Attending: Emergency Medicine | Admitting: Emergency Medicine

## 2015-06-11 ENCOUNTER — Other Ambulatory Visit: Payer: Self-pay

## 2015-06-11 DIAGNOSIS — Z8701 Personal history of pneumonia (recurrent): Secondary | ICD-10-CM | POA: Diagnosis not present

## 2015-06-11 DIAGNOSIS — Z794 Long term (current) use of insulin: Secondary | ICD-10-CM | POA: Diagnosis not present

## 2015-06-11 DIAGNOSIS — F329 Major depressive disorder, single episode, unspecified: Secondary | ICD-10-CM | POA: Insufficient documentation

## 2015-06-11 DIAGNOSIS — N189 Chronic kidney disease, unspecified: Secondary | ICD-10-CM

## 2015-06-11 DIAGNOSIS — Z86018 Personal history of other benign neoplasm: Secondary | ICD-10-CM | POA: Diagnosis not present

## 2015-06-11 DIAGNOSIS — J45901 Unspecified asthma with (acute) exacerbation: Secondary | ICD-10-CM | POA: Insufficient documentation

## 2015-06-11 DIAGNOSIS — E11319 Type 2 diabetes mellitus with unspecified diabetic retinopathy without macular edema: Secondary | ICD-10-CM | POA: Diagnosis not present

## 2015-06-11 DIAGNOSIS — Z720 Tobacco use: Secondary | ICD-10-CM | POA: Insufficient documentation

## 2015-06-11 DIAGNOSIS — R7989 Other specified abnormal findings of blood chemistry: Secondary | ICD-10-CM | POA: Diagnosis present

## 2015-06-11 DIAGNOSIS — D631 Anemia in chronic kidney disease: Secondary | ICD-10-CM | POA: Diagnosis not present

## 2015-06-11 DIAGNOSIS — Z8739 Personal history of other diseases of the musculoskeletal system and connective tissue: Secondary | ICD-10-CM | POA: Diagnosis not present

## 2015-06-11 DIAGNOSIS — N184 Chronic kidney disease, stage 4 (severe): Secondary | ICD-10-CM | POA: Insufficient documentation

## 2015-06-11 DIAGNOSIS — E785 Hyperlipidemia, unspecified: Secondary | ICD-10-CM | POA: Insufficient documentation

## 2015-06-11 DIAGNOSIS — I129 Hypertensive chronic kidney disease with stage 1 through stage 4 chronic kidney disease, or unspecified chronic kidney disease: Secondary | ICD-10-CM | POA: Insufficient documentation

## 2015-06-11 DIAGNOSIS — Z79899 Other long term (current) drug therapy: Secondary | ICD-10-CM | POA: Insufficient documentation

## 2015-06-11 DIAGNOSIS — Z8719 Personal history of other diseases of the digestive system: Secondary | ICD-10-CM | POA: Diagnosis not present

## 2015-06-11 DIAGNOSIS — I509 Heart failure, unspecified: Secondary | ICD-10-CM | POA: Insufficient documentation

## 2015-06-11 LAB — CBC WITH DIFFERENTIAL/PLATELET
BASOS ABS: 0.1 10*3/uL (ref 0.0–0.2)
BASOS PCT: 1 %
Basophils Absolute: 0.1 10*3/uL (ref 0.0–0.1)
Basos: 1 %
EOS (ABSOLUTE): 0.2 10*3/uL (ref 0.0–0.4)
EOS ABS: 0.2 10*3/uL (ref 0.0–0.7)
Eos: 1 %
Eosinophils Relative: 2 %
HCT: 27.8 % — ABNORMAL LOW (ref 36.0–46.0)
HEMATOCRIT: 29.1 % — AB (ref 34.0–46.6)
HEMOGLOBIN: 9.1 g/dL — AB (ref 12.0–15.0)
Hemoglobin: 9.3 g/dL — ABNORMAL LOW (ref 11.1–15.9)
Immature Grans (Abs): 0 10*3/uL (ref 0.0–0.1)
Immature Granulocytes: 0 %
LYMPHS ABS: 1.9 10*3/uL (ref 0.7–3.1)
Lymphocytes Relative: 16 %
Lymphs Abs: 1.6 10*3/uL (ref 0.7–4.0)
Lymphs: 14 %
MCH: 30.4 pg (ref 26.6–33.0)
MCH: 30.8 pg (ref 26.0–34.0)
MCHC: 32 g/dL (ref 31.5–35.7)
MCHC: 32.7 g/dL (ref 30.0–36.0)
MCV: 95 fL (ref 79–97)
MCV: 94.2 fL (ref 78.0–100.0)
MONOS ABS: 1 10*3/uL — AB (ref 0.1–0.9)
MONOS PCT: 7 %
Monocytes Absolute: 0.7 10*3/uL (ref 0.1–1.0)
Monocytes: 7 %
NEUTROS PCT: 74 %
Neutro Abs: 7.6 10*3/uL (ref 1.7–7.7)
Neutrophils Absolute: 10 10*3/uL — ABNORMAL HIGH (ref 1.4–7.0)
Neutrophils: 77 %
PLATELETS: 298 10*3/uL (ref 150–400)
Platelets: 339 10*3/uL (ref 150–379)
RBC: 3.06 x10E6/uL — AB (ref 3.77–5.28)
RBC: 2.95 MIL/uL — ABNORMAL LOW (ref 3.87–5.11)
RDW: 14.6 % (ref 12.3–15.4)
RDW: 14.2 % (ref 11.5–15.5)
WBC: 13.1 10*3/uL — AB (ref 3.4–10.8)
WBC: 10.2 10*3/uL (ref 4.0–10.5)

## 2015-06-11 LAB — LIPID PANEL
CHOL/HDL RATIO: 3.9 ratio (ref 0.0–4.4)
CHOLESTEROL TOTAL: 178 mg/dL (ref 100–199)
HDL: 46 mg/dL (ref >39)
LDL Calculated: 113 mg/dL — ABNORMAL HIGH (ref 0–99)
Triglycerides: 94 mg/dL (ref 0–149)
VLDL CHOLESTEROL CAL: 19 mg/dL (ref 5–40)

## 2015-06-11 LAB — BMP8+EGFR
BUN / CREAT RATIO: 11 (ref 9–23)
BUN: 49 mg/dL — ABNORMAL HIGH (ref 6–24)
CHLORIDE: 103 mmol/L (ref 97–106)
CO2: 18 mmol/L (ref 18–29)
Calcium: 8.4 mg/dL — ABNORMAL LOW (ref 8.7–10.2)
Creatinine, Ser: 4.36 mg/dL (ref 0.57–1.00)
GFR calc Af Amer: 13 mL/min/{1.73_m2} — ABNORMAL LOW (ref >59)
GFR calc non Af Amer: 11 mL/min/{1.73_m2} — ABNORMAL LOW (ref >59)
GLUCOSE: 104 mg/dL — AB (ref 65–99)
POTASSIUM: 6 mmol/L — AB (ref 3.5–5.2)
SODIUM: 137 mmol/L (ref 136–144)

## 2015-06-11 LAB — BASIC METABOLIC PANEL
Anion gap: 5 (ref 5–15)
BUN: 51 mg/dL — ABNORMAL HIGH (ref 6–20)
CALCIUM: 8.6 mg/dL — AB (ref 8.9–10.3)
CHLORIDE: 111 mmol/L (ref 101–111)
CO2: 20 mmol/L — ABNORMAL LOW (ref 22–32)
CREATININE: 4.18 mg/dL — AB (ref 0.44–1.00)
GFR, EST AFRICAN AMERICAN: 14 mL/min — AB (ref >60)
GFR, EST NON AFRICAN AMERICAN: 12 mL/min — AB (ref >60)
Glucose, Bld: 100 mg/dL — ABNORMAL HIGH (ref 65–99)
Potassium: 5.6 mmol/L — ABNORMAL HIGH (ref 3.5–5.1)
SODIUM: 136 mmol/L (ref 135–145)

## 2015-06-11 MED ORDER — SODIUM POLYSTYRENE SULFONATE 15 GM/60ML PO SUSP
15.0000 g | Freq: Once | ORAL | Status: AC
  Administered 2015-06-11: 15 g via ORAL
  Filled 2015-06-11: qty 60

## 2015-06-11 NOTE — Discharge Instructions (Signed)
Please follow-up with your nephrologist as soon as possible. Your lab values today have improved from yesterday and all of your lab values have been consistent with your baseline. Continue taking the increased dose of Lasix as your doctor prescribed.   Please obtain all of your results from medical records or have your doctors office obtain the results - share them with your doctor - you should be seen at your doctors office in the next 2 days. Call today to arrange your follow up. Take the medications as prescribed. Please review all of the medicines and only take them if you do not have an allergy to them. Please be aware that if you are taking birth control pills, taking other prescriptions, ESPECIALLY ANTIBIOTICS may make the birth control ineffective - if this is the case, either do not engage in sexual activity or use alternative methods of birth control such as condoms until you have finished the medicine and your family doctor says it is OK to restart them. If you are on a blood thinner such as COUMADIN, be aware that any other medicine that you take may cause the coumadin to either work too much, or not enough - you should have your coumadin level rechecked in next 7 days if this is the case.  ?  It is also a possibility that you have an allergic reaction to any of the medicines that you have been prescribed - Everybody reacts differently to medications and while MOST people have no trouble with most medicines, you may have a reaction such as nausea, vomiting, rash, swelling, shortness of breath. If this is the case, please stop taking the medicine immediately and contact your physician.  ?  You should return to the ER if you develop severe or worsening symptoms.

## 2015-06-11 NOTE — Progress Notes (Signed)
Attempted to call pt about critical creatinine and potassium lab work. Pt is a known with chronic kidney diseaset. No answer and left message to contact us. Will try again  Patient called back and notified patient of critical results. Pt states she is currently not on dialysis and is working to get in. I told pt she needs to go to ED with potassium elevated and kidney function declining. Pt voiced understanding and states she will go.

## 2015-06-11 NOTE — ED Provider Notes (Signed)
CSN: [id number]     Arrival date & time 06/11/15  1331 History   First MD Initiated Contact with Patient 06/11/15 1703     Chief Complaint  Patient presents with  . Abnormal Lab    per PCP  . Shortness of Breath    HPI   Kara Knapp is an 44 y.o. female with history of stage IV CKD, insulin dependent DM, CHF who presents to the ED for evaluation of abnormal lab values. She was seen by her PCP yesterday and was called this morning to come to the ER for elevated K+ and Cr values. She reports she feels the same as baseline. She states that at baseline she has had slowly progressive SOB over the past few months that her nephrologist states is due to fluid backup 2/2 kidney failure. She otherwise has no complaints today. Denies chest pain, abdominal pain, fever, chills, N/V/D. She states she still makes urine. She does report that her nephrologist just increased her Lasix dosage. She is also in the process of working with vascular surgery to schedule AV graft procedure for future dialysis.   Labs from PCP significant for K+ 6.0 and Cr 4.36. Improved in the ED today to 5.6, 4.18. Review of records indicate that this is close to her baseline.  BUN  Date Value Ref Range Status  06/11/2015 51* 6 - 20 mg/dL Final  06/10/2015 49* 6 - 24 mg/dL Final  04/28/2015 63* 6 - 20 mg/dL Final  04/25/2015 40* 6 - 24 mg/dL Final  02/17/2015 62* 6 - 20 mg/dL Final  01/29/2015 46* 6 - 20 mg/dL Final   CREATININE, SER  Date Value Ref Range Status  06/11/2015 4.18* 0.44 - 1.00 mg/dL Final  06/10/2015 4.36* 0.57 - 1.00 mg/dL Final    Comment:                      Client Requested Flag  04/28/2015 4.20* 0.44 - 1.00 mg/dL Final  04/25/2015 3.61* 0.57 - 1.00 mg/dL Final    Comment:                      Client Requested Flag      Past Medical History  Diagnosis Date  . Insulin-dependent diabetes mellitus with retinopathy ([place])   . Neuropathy ([place])   . Asthma   . Hypertension   . Osteomyelitis ([place])    left middle toe   . Cataract   . Bronchitis   . Pneumonia   . Depression   . Neuropathy due to secondary diabetes mellitus ([place])   . Hyperlipidemia   . Fibroids   . Anemia of chronic disease   . Chronic kidney disease     Stage III-IV  . CHF (congestive heart failure) ([facility])     Presumed diastolic  . Cholecystitis, acute 05/26/2013    Status post cholecystectomy  . Tobacco abuse   . Fibroid, uterine   . CHF (congestive heart failure) ([place])   . Kidney disease   . CHF (congestive heart failure) ([place])   . Kidney failure    Past Surgical History  Procedure Laterality Date  . Cesarean section    . Tubal ligation    . Cholecystectomy N/A 05/25/2013    Procedure: LAPAROSCOPIC CHOLECYSTECTOMY;  Surgeon: Jamesetta So, MD;  Location: [facility];  Service: General;  Laterality: N/A;  . Eye surgery    . Pars plana vitrectomy Left 11/24/2014    Procedure: PARS PLANA VITRECTOMY  WITH 25 GAUGE;  Surgeon: Hurman Horn, MD;  Location: [facility];  Service: Ophthalmology;  Laterality: Left;  . Photocoagulation with laser Left 11/24/2014    Procedure: PHOTOCOAGULATION WITH LASER;  Surgeon: Hurman Horn, MD;  Location: [facility];  Service: Ophthalmology;  Laterality: Left;  with insertion of silicone oil  . Peripheral vascular catheterization N/A 04/28/2015    Procedure: Bilateral Upper Extremity Venography;  Surgeon: Conrad , MD;  Location: [facility];  Service: Cardiovascular;  Laterality: N/A;   Family History  Problem Relation Age of Onset  . COPD Mother   . Cancer Father   . Diabetes Sister   . Deep vein thrombosis Sister   . Diabetes Brother   . Hyperlipidemia Brother   . Hypertension Brother   . Mental retardation Sister    Social History  Substance Use Topics  . Smoking status: Current Every Day Smoker -- 1.00 packs/day for 20 years    Types: Cigarettes  . Smokeless tobacco: Never Used  . Alcohol Use: No   OB History    Gravida Para Term Preterm AB TAB SAB Ectopic Multiple  Living   5 5             Review of Systems  All other systems reviewed and are negative.     Allergies  Bactrim and Prednisone  Home Medications   Prior to Admission medications   Medication Sig Start Date End Date Taking? Authorizing Provider  ACCU-CHEK AVIVA PLUS test strip 1 Units by Other route 4 (four) times daily. 02/11/15   Historical Provider, MD  albuterol (PROVENTIL HFA;VENTOLIN HFA) 108 (90 BASE) MCG/ACT inhaler Inhale 2 puffs into the lungs every 4 (four) hours as needed for wheezing or shortness of breath. 05/15/14   Lily Kocher, PA-C  B Complex-C-Folic Acid (B COMPLEX-VITAMIN C-FOLIC ACID) 1 MG tablet Take 1 tablet by mouth daily with breakfast. 01/23/15   Rexene Alberts, MD  blood glucose meter kit and supplies KIT Podigy Voice GLucometer - patient is visually impaired and needs meter that reads results.  Use to check BG up to QID.  Dx: E11.319 Z79.4 05/14/15   Tammy Eckard, PHARMD  carvedilol (COREG) 6.25 MG tablet Take 1 tablet (6.25 mg total) by mouth 2 (two) times daily with a meal. Blood pressure medication. 06/01/15   Timmothy Euler, MD  citalopram (CELEXA) 20 MG tablet Take 20 mg by mouth daily. 10/20/14   Historical Provider, MD  furosemide (LASIX) 40 MG tablet Take 80 mg by mouth daily.  03/28/15   Historical Provider, MD  gabapentin (NEURONTIN) 300 MG capsule Take 1 capsule (300 mg total) by mouth 3 (three) times daily. 03/02/15   Timmothy Euler, MD  HUMALOG KWIKPEN 100 UNIT/ML KiwkPen INJECT 7-10 UNITS INTO THE SKIN THREE TIMES DAILY. 05/31/15   Timmothy Euler, MD  hydrALAZINE (APRESOLINE) 25 MG tablet Take 25 mg by mouth 3 (three) times daily.     Historical Provider, MD  LANTUS SOLOSTAR 100 UNIT/ML Solostar Pen INJECT 15 UNITS AT BEDTIME Patient taking differently: inject 15 units every morning 06/08/15   Timmothy Euler, MD  simvastatin (ZOCOR) 20 MG tablet Take 1 tablet (20 mg total) by mouth daily at 6 PM. 04/25/15   Tammy Eckard, PHARMD  traMADol  (ULTRAM) 50 MG tablet Take 1 tablet (50 mg total) by mouth every 12 (twelve) hours as needed. Patient not taking: Reported on 06/10/2015 05/11/15   Timmothy Euler, MD  triamcinolone cream (KENALOG) 0.1 %  Apply 1 application topically 2 (two) times daily. Avoid face, groin, and under arms Patient not taking: Reported on 06/10/2015 05/17/15   Timmothy Euler, MD   BP 189/83 mmHg  Pulse 73  Temp(Src) 98.1 F (36.7 C) (Oral)  Resp 16  Ht 5' 3"  (1.6 m)  Wt 190 lb (86.183 kg)  BMI 33.67 kg/m2  SpO2 98%  LMP 04/10/2015 Physical Exam  Constitutional: She is oriented to person, place, and time. No distress.  HENT:  Right Ear: External ear normal.  Left Ear: External ear normal.  Nose: Nose normal.  Mouth/Throat: Oropharynx is clear and moist. No oropharyngeal exudate.  Eyes: Conjunctivae and EOM are normal. Pupils are equal, round, and reactive to light.  Neck: Normal range of motion. Neck supple. No tracheal deviation present.  Cardiovascular: Normal rate, regular rhythm, normal heart sounds and intact distal pulses.   No murmur heard. Pulmonary/Chest: Effort normal and breath sounds normal. No respiratory distress. She has no wheezes.  Few crackles in bilat lower lung bases  Abdominal: Soft. Bowel sounds are normal. She exhibits no distension. There is no tenderness. There is no rebound and no guarding.  Musculoskeletal: She exhibits no edema.  Lymphadenopathy:    She has no cervical adenopathy.  Neurological: She is alert and oriented to person, place, and time. No cranial nerve deficit.  Skin: Skin is warm and dry. She is not diaphoretic.  1+ pitting edema bilat LE  Psychiatric: She has a normal mood and affect.  Nursing note and vitals reviewed.   ED Course  Procedures (including critical care time) Labs Review Labs Reviewed  CBC WITH DIFFERENTIAL/PLATELET - Abnormal; Notable for the following:    RBC 2.95 (*)    Hemoglobin 9.1 (*)    HCT 27.8 (*)    All other  components within normal limits  BASIC METABOLIC PANEL - Abnormal; Notable for the following:    Potassium 5.6 (*)    CO2 20 (*)    Glucose, Bld 100 (*)    BUN 51 (*)    Creatinine, Ser 4.18 (*)    Calcium 8.6 (*)    GFR calc non Af Amer 12 (*)    GFR calc Af Amer 14 (*)    All other components within normal limits    Imaging Review Dg Chest 2 View  06/11/2015  CLINICAL DATA:  Hyperkalemia and renal insufficiency. EXAM: CHEST - 2 VIEW COMPARISON:  02/17/2015 FINDINGS: The heart size and mediastinal contours are within normal limits. Mild interval increase in pulmonary vascularity and interstitial prominence may be consistent with mild interstitial edema. No overt airspace edema, pleural fluid, nodule or pneumothorax is identified. The visualized skeletal structures are unremarkable. IMPRESSION: Potential mild interstitial edema. Electronically Signed   By: Aletta Edouard M.D.   On: 06/11/2015 13:59   I have personally reviewed and evaluated these images and lab results as part of my medical decision-making.   EKG Interpretation None      MDM   Final diagnoses:  Chronic kidney failure, unspecified stage    Pt's labs improved from yesterday. No significant pulmonary edema on CXR. Lab values are within her baseline range. She is not complaining of any new symptoms.. Will give kayexelate here. Instructed pt to continue at newly increased dose of Lasix and follow up with nephrologist. Return precautions given    Anne Ng, PA-C 06/11/15 1857  Noemi Chapel, MD 06/12/15 [phone]

## 2015-06-11 NOTE — ED Notes (Signed)
PCP called pt and told her to come to ED for abnormal labs.  K+ and creatinine elevated.  Denies any pain.  C/o SOB.

## 2015-06-11 NOTE — ED Provider Notes (Signed)
The patient is a 44 year old female who has had progressive renal decline, has also had progressive weight gain and was seen at her doctor's yesterday with increased her Lasix. They called her and told her to come to the hospital today because of elevated potassium and renal function. On exam the patient has no distress on my exam, minimal peripheral edema, symmetrical, scant. She has a soft abdomen, clear heart sounds, I do not detect any rales or wheezing and she speaks in full sentences, and malaise without difficulty and is not orthopneic. Review of the chest x-ray shows possible mild interstitial edema, labs unremarkable except for potassium of 5.6 and a creatinine of 4.3 m of which are different from baseline values. The patient will be given Kayexalate, she can follow-up in the outpatient setting, no indications for admission at this time. The patient has been informed of the plan and has expressed her understanding.  ED ECG REPORT  I personally interpreted this EKG   Date: 06/12/2015   Rate: 70  Rhythm: normal sinus rhythm  QRS Axis: normal  Intervals: normal  ST/T Wave abnormalities: normal  Conduction Disutrbances:none  Narrative Interpretation:   Old EKG Reviewed: none available  No signs of Hyper K on ECG  Medical screening examination/treatment/procedure(s) were conducted as a shared visit with non-physician practitioner(s) and myself.  I personally evaluated the patient during the encounter.  Clinical Impression:   Final diagnoses:  Chronic kidney failure, unspecified stage         Noemi Chapel, MD 06/12/15 [phone]

## 2015-06-13 ENCOUNTER — Other Ambulatory Visit: Payer: Self-pay | Admitting: Ophthalmology

## 2015-06-26 ENCOUNTER — Emergency Department (HOSPITAL_COMMUNITY)
Admission: EM | Admit: 2015-06-26 | Discharge: 2015-06-27 | Disposition: A | Discharge status: Home / Self Care | Payer: Medicaid Other | Attending: Emergency Medicine | Admitting: Emergency Medicine

## 2015-06-26 ENCOUNTER — Encounter (HOSPITAL_COMMUNITY): Payer: Self-pay | Admitting: *Deleted

## 2015-06-26 DIAGNOSIS — H5711 Ocular pain, right eye: Secondary | ICD-10-CM | POA: Diagnosis present

## 2015-06-26 DIAGNOSIS — Z9889 Other specified postprocedural states: Secondary | ICD-10-CM | POA: Diagnosis not present

## 2015-06-26 DIAGNOSIS — N184 Chronic kidney disease, stage 4 (severe): Secondary | ICD-10-CM | POA: Insufficient documentation

## 2015-06-26 DIAGNOSIS — G629 Polyneuropathy, unspecified: Secondary | ICD-10-CM | POA: Insufficient documentation

## 2015-06-26 DIAGNOSIS — J45909 Unspecified asthma, uncomplicated: Secondary | ICD-10-CM | POA: Insufficient documentation

## 2015-06-26 DIAGNOSIS — I509 Heart failure, unspecified: Secondary | ICD-10-CM | POA: Diagnosis not present

## 2015-06-26 DIAGNOSIS — F329 Major depressive disorder, single episode, unspecified: Secondary | ICD-10-CM | POA: Diagnosis not present

## 2015-06-26 DIAGNOSIS — I129 Hypertensive chronic kidney disease with stage 1 through stage 4 chronic kidney disease, or unspecified chronic kidney disease: Secondary | ICD-10-CM | POA: Insufficient documentation

## 2015-06-26 DIAGNOSIS — Z86018 Personal history of other benign neoplasm: Secondary | ICD-10-CM | POA: Insufficient documentation

## 2015-06-26 DIAGNOSIS — R05 Cough: Secondary | ICD-10-CM | POA: Insufficient documentation

## 2015-06-26 DIAGNOSIS — Z794 Long term (current) use of insulin: Secondary | ICD-10-CM | POA: Insufficient documentation

## 2015-06-26 DIAGNOSIS — Z79899 Other long term (current) drug therapy: Secondary | ICD-10-CM | POA: Insufficient documentation

## 2015-06-26 DIAGNOSIS — F17210 Nicotine dependence, cigarettes, uncomplicated: Secondary | ICD-10-CM | POA: Diagnosis not present

## 2015-06-26 DIAGNOSIS — H4311 Vitreous hemorrhage, right eye: Secondary | ICD-10-CM | POA: Diagnosis not present

## 2015-06-26 DIAGNOSIS — D631 Anemia in chronic kidney disease: Secondary | ICD-10-CM | POA: Diagnosis not present

## 2015-06-26 DIAGNOSIS — E785 Hyperlipidemia, unspecified: Secondary | ICD-10-CM | POA: Insufficient documentation

## 2015-06-26 DIAGNOSIS — Z8701 Personal history of pneumonia (recurrent): Secondary | ICD-10-CM | POA: Diagnosis not present

## 2015-06-26 DIAGNOSIS — E11319 Type 2 diabetes mellitus with unspecified diabetic retinopathy without macular edema: Secondary | ICD-10-CM | POA: Diagnosis not present

## 2015-06-26 DIAGNOSIS — H2101 Hyphema, right eye: Secondary | ICD-10-CM | POA: Insufficient documentation

## 2015-06-26 MED ORDER — ATROPINE SULFATE 1 % OP SOLN: 1.0000 [drp] | Freq: Four times a day (QID) | OPHTHALMIC | Status: DC

## 2015-06-26 MED ORDER — CYCLOPENTOLATE-PHENYLEPHRINE OP SOLN OPTIME - NO CHARGE
OPHTHALMIC | Status: AC
  Filled 2015-06-26: qty 2

## 2015-06-26 MED ORDER — ATROPINE SULFATE 1 % OP SOLN
1.0000 [drp] | Freq: Two times a day (BID) | OPHTHALMIC | Status: DC
  Administered 2015-06-27: 1 [drp] via OPHTHALMIC
  Filled 2015-06-26: qty 2

## 2015-06-26 MED ORDER — TETRACAINE HCL 0.5 % OP SOLN
OPHTHALMIC | Status: DC
Start: 2015-06-26 — End: 2015-06-27
  Filled 2015-06-26: qty 4

## 2015-06-26 MED ORDER — CYCLOPENTOLATE HCL 1 % OP SOLN
1.0000 [drp] | Freq: Four times a day (QID) | OPHTHALMIC | Status: DC
  Filled 2015-06-26: qty 2

## 2015-06-26 MED ORDER — HYDROCODONE-ACETAMINOPHEN 5-325 MG PO TABS
1.0000 | ORAL_TABLET | ORAL | Status: AC
  Administered 2015-06-26: 1 via ORAL
  Filled 2015-06-26: qty 1

## 2015-06-26 NOTE — ED Provider Notes (Signed)
CSN: [id number]     Arrival date & time 06/26/15  2047 History   First MD Initiated Contact with Patient 06/26/15 2136     Chief Complaint  Patient presents with  . Eye Pain   HPI Patient presents to the emergency room with complaints of right eye pain that started this evening. Patient has a history of diabetic retinopathy as well as cataracts. She had cataract surgery on the right eye a few weeks ago. She says that she had been recovering well without any issues. This evening she started having discomfort in her right eye. Noticed that it looked like she had blood within the eye. She denies any trauma. She has had some coughing but this is not unusual for her. She has not noticed any visual changes Past Medical History  Diagnosis Date  . Insulin-dependent diabetes mellitus with retinopathy ([place])   . Neuropathy ([place])   . Asthma   . Hypertension   . Osteomyelitis ([place])     left middle toe   . Cataract   . Bronchitis   . Pneumonia   . Depression   . Neuropathy due to secondary diabetes mellitus ([place])   . Hyperlipidemia   . Fibroids   . Anemia of chronic disease   . Chronic kidney disease     Stage III-IV  . CHF (congestive heart failure) ([facility])     Presumed diastolic  . Cholecystitis, acute 05/26/2013    Status post cholecystectomy  . Tobacco abuse   . Fibroid, uterine   . CHF (congestive heart failure) ([place])   . Kidney disease   . CHF (congestive heart failure) ([place])   . Kidney failure    Past Surgical History  Procedure Laterality Date  . Cesarean section    . Tubal ligation    . Cholecystectomy N/A 05/25/2013    Procedure: LAPAROSCOPIC CHOLECYSTECTOMY;  Surgeon: Jamesetta So, MD;  Location: [facility];  Service: General;  Laterality: N/A;  . Eye surgery    . Pars plana vitrectomy Left 11/24/2014    Procedure: PARS PLANA VITRECTOMY WITH 25 GAUGE;  Surgeon: Hurman Horn, MD;  Location: [facility];  Service: Ophthalmology;  Laterality: Left;  . Photocoagulation with laser Left  11/24/2014    Procedure: PHOTOCOAGULATION WITH LASER;  Surgeon: Hurman Horn, MD;  Location: [place] [facility];  Service: Ophthalmology;  Laterality: Left;  with insertion of silicone oil  . Peripheral vascular catheterization N/A 04/28/2015    Procedure: Bilateral Upper Extremity Venography;  Surgeon: Conrad [place], MD;  Location: [facility];  Service: Cardiovascular;  Laterality: N/A;   Family History  Problem Relation Age of Onset  . COPD Mother   . Cancer Father   . Diabetes Sister   . Deep vein thrombosis Sister   . Diabetes Brother   . Hyperlipidemia Brother   . Hypertension Brother   . Mental retardation Sister    Social History  Substance Use Topics  . Smoking status: Current Every Day Smoker -- 1.00 packs/day for 20 years    Types: Cigarettes  . Smokeless tobacco: Never Used  . Alcohol Use: No   OB History    Gravida Para Term Preterm AB TAB SAB Ectopic Multiple Living   5 5             Review of Systems  All other systems reviewed and are negative.     Allergies  Bactrim and Prednisone  Home Medications   Prior to Admission medications   Medication  Sig Start Date End Date Taking? Authorizing Provider  ACCU-CHEK AVIVA PLUS test strip 1 each by Other route See admin instructions. Check blood sugar twice daily 02/11/15   Historical Provider, MD  albuterol (PROVENTIL HFA;VENTOLIN HFA) 108 (90 BASE) MCG/ACT inhaler Inhale 2 puffs into the lungs every 4 (four) hours as needed for wheezing or shortness of breath. Patient not taking: Reported on 06/24/2015 05/15/14   Lily Kocher, PA-C  B Complex-C-Folic Acid (B COMPLEX-VITAMIN C-FOLIC ACID) 1 MG tablet Take 1 tablet by mouth daily with breakfast. 01/23/15   Rexene Alberts, MD  blood glucose meter kit and supplies KIT Podigy Voice GLucometer - patient is visually impaired and needs meter that reads results.  Use to check BG up to QID.  Dx: E11.319 Z79.4 05/14/15   Tammy Eckard, PHARMD  calcium acetate (PHOSLO) 667 MG capsule  Take 1,334 mg by mouth 3 (three) times daily with meals.    Historical Provider, MD  carvedilol (COREG) 6.25 MG tablet Take 1 tablet (6.25 mg total) by mouth 2 (two) times daily with a meal. Blood pressure medication. 06/01/15   Timmothy Euler, MD  citalopram (CELEXA) 20 MG tablet Take 20 mg by mouth daily. 10/20/14   Historical Provider, MD  furosemide (LASIX) 40 MG tablet Take 80 mg by mouth daily.  03/28/15   Historical Provider, MD  gabapentin (NEURONTIN) 300 MG capsule Take 1 capsule (300 mg total) by mouth 3 (three) times daily. 03/02/15   Timmothy Euler, MD  HUMALOG KWIKPEN 100 UNIT/ML KiwkPen INJECT 7-10 UNITS INTO THE SKIN THREE TIMES DAILY. 05/31/15   Timmothy Euler, MD  hydrALAZINE (APRESOLINE) 25 MG tablet Take 25 mg by mouth 3 (three) times daily.     Historical Provider, MD  LANTUS SOLOSTAR 100 UNIT/ML Solostar Pen INJECT 15 UNITS AT BEDTIME Patient taking differently: inject 15 units every morning 06/08/15   Timmothy Euler, MD  simvastatin (ZOCOR) 20 MG tablet Take 1 tablet (20 mg total) by mouth daily at 6 PM. 04/25/15   Tammy Eckard, PHARMD  traMADol (ULTRAM) 50 MG tablet Take 1 tablet (50 mg total) by mouth every 12 (twelve) hours as needed. Patient not taking: Reported on 06/10/2015 05/11/15   Timmothy Euler, MD  triamcinolone cream (KENALOG) 0.1 % Apply 1 application topically 2 (two) times daily. Avoid face, groin, and under arms Patient not taking: Reported on 06/10/2015 05/17/15   Timmothy Euler, MD   BP 185/79 mmHg  Pulse 75  Temp(Src) 97.6 F (36.4 C) (Oral)  Resp 14  Ht 5' 3"  (1.6 m)  Wt 85.276 kg  BMI 33.31 kg/m2  SpO2 99%  LMP 04/10/2015 Physical Exam  Constitutional: She appears well-developed and well-nourished. No distress.  HENT:  Head: Normocephalic and atraumatic.  Right Ear: External ear normal.  Left Ear: External ear normal.  Eyes: Conjunctivae and EOM are normal. Right eye exhibits no discharge and no exudate. No foreign body present in  the right eye. Left eye exhibits no discharge and no exudate. No foreign body present in the left eye. Right conjunctiva is not injected. Left conjunctiva is not injected. No scleral icterus.  Fundoscopic exam:      The right eye shows hemorrhage.       The left eye shows no hemorrhage.  Slit lamp exam:      The right eye shows hyphema.  Patient has a hyphema noted in the right anterior chamber of the eye, received images in the chart, able to obtain a  pressure of 26 mm in the left eye, the Tono-Pen keeps giving an error message when trying to obtain pressure in the right eye  Neck: Neck supple. No tracheal deviation present.  Cardiovascular: Normal rate.   Pulmonary/Chest: Effort normal. No stridor. No respiratory distress.  Musculoskeletal: She exhibits no edema.  Neurological: She is alert. Cranial nerve deficit: no gross deficits.  Skin: Skin is warm and dry. No rash noted.  Psychiatric: She has a normal mood and affect.  Nursing note and vitals reviewed.     ED Course  Procedures (including critical care time) Labs Review Labs Reviewed - No data to display    MDM   Final diagnoses:  Hyphema, right  Vitreous hemorrhage of right eye ([place])   I initially consulted with Dr Iona Hansen who kindly provided recommendations and recommended close follow up with her retina specialist.  The patient informed me that she has seen Dr Zadie Rhine in the past and that her vision was getting worse.  I spoke with Dr Zadie Rhine.  He is familiar with the patient.  She has bad diabetic retinopathy.  Will start cyclogyl eye drops 4 times per day.  He will see her in the office tomorrow at 3 pm.    Dorie Rank, MD 06/26/15 2231

## 2015-06-26 NOTE — ED Notes (Signed)
Pt c/o right eye pain. Pt states 3 hours ago her eye began to look like a blood vessel had burst in her eye. Pt had a cataract removed last month in the same eye.

## 2015-06-26 NOTE — Discharge Instructions (Signed)
Hyphema Hyphema is bleeding in the eye. This may occurin the front of the eye between the clear covering of the eye (cornea) and the colored part of the eye (iris). You may be able to see the blood in the front part of your eye. A hyphema may be large or small. Hyphema may be painful and can affect your vision. Treatment is important to prevent permanent loss of vision. CAUSES  Eye injury, such as a blow to your eye or upper part of your face, is the most common cause of this condition. Eye surgery can also cause this condition. Other less common causes include:  Abnormal blood vessels that form in the iris.  Eye infections.  Blood clotting disorders.  Artificial lenses used after cataract surgery.  Eye cancer. RISK FACTORS This condition is more likely to occur in people who play sports, especially sports that use small balls. You may also be more likely to develop this condition if you:  Have a disease that prevents normal blood clotting, such as hemophilia.  Take certain medicines that thin your blood, such as aspirin.  Have diabetes.  Had recent eye surgery.  Have sickle cell anemia. SYMPTOMS  The most common symptom of this condition is a pool of blood in the front of your eye. The blood may appear red or black. A very small hyphema may not be visible. A large hyphema may fill part or all of the front part of your eye. Symptoms may also include:   Blurred vision or vision loss.  Pain.  Sensitivity to bright light. DIAGNOSIS  This condition is diagnosed with a medical history and physical exam. You may have a blood test to check for a bleeding disorder or sickle cell disease. You may also have an eye exam done by an eye specialist (ophthalmologist). This may include:   Checking your eye with a type of microscope (slit lamp).  A vision test.  Measuring the pressure in your eye. TREATMENT  Treatment depends on the severity of the condition. Many hyphemas go away on  their own. Your health care provider will monitor your hyphema closely until it goes away completely. Treatment may also include:   Restricted activity or bed rest with your head elevated.  Wearing a cover over your eye (eye shield) to protect it from further injury.  Stopping all medicines that can increase bleeding, such as aspirin. Only do this as told by your health care provider.  Eye drops or medicines taken by mouth to control swelling and pressure in your eye. Eye surgery may be needed to remove the hyphema if other treatments do not help. HOME CARE INSTRUCTIONS   Rest in bed as told by your health care provider. Lie on your back and use extra pillows to keep your head raised.  Take over-the-counter and prescription medicines only as told by your health care provider.  Wear your eye shield as told by your health care provider.  Do not bend forward or lower your head until your health care provider approves.  Do not lift anything that is heavier than 10 lb (4.5 kg) until your health care provider approves.  Keep all follow-up visits as told by your health care provider. This is important. PREVENTION  It is important to always wear eye protection when you are doing any activity that can result in eye injury. SEEK MEDICAL CARE IF:   You develop pain in the affected eye.  Your vision is not improving.  The amount of  blood in your eye does not decrease after several days. SEEK IMMEDIATE MEDICAL CARE IF:   Your vision gets worse.  The amount of blood in your eye increases.  You feel nauseous or vomit.   This information is not intended to replace advice given to you by your health care provider. Make sure you discuss any questions you have with your health care provider.   Document Released: 10/22/2000 Document Revised: 04/06/2015 Document Reviewed: 12/08/2014 Elsevier Interactive Patient Education [employer].

## 2015-06-27 ENCOUNTER — Ambulatory Visit: Payer: Medicaid Other | Admitting: Family Medicine

## 2015-06-27 NOTE — ED Provider Notes (Signed)
House Supervisor: cyclopentolate unavailable. T/C to Dr. Zadie Rhine to update: as alternative, requests to have pt start atropine 1% BID and f/u tomorrow. Atropine 1% available and given to pt.   Francine Graven, DO 06/27/15 [phone]

## 2015-06-27 NOTE — ED Notes (Signed)
Pt alert & oriented x4. Patient given discharge instructions, paperwork & prescription(s).  Patient verbalized understanding. Pt carried to waiting room in wheelchair to wait on her ride. Pt left department w/ no further questions.

## 2015-06-28 ENCOUNTER — Other Ambulatory Visit: Payer: Self-pay | Admitting: Pharmacist

## 2015-06-29 ENCOUNTER — Ambulatory Visit: Payer: Self-pay | Admitting: Pharmacist

## 2015-06-29 ENCOUNTER — Telehealth: Payer: Self-pay | Admitting: Family Medicine

## 2015-06-30 ENCOUNTER — Inpatient Hospital Stay (HOSPITAL_COMMUNITY)
Admission: RE | Admit: 2015-06-30 | Discharge: 2015-06-30 | Disposition: A | Discharge status: Home / Self Care | Payer: Medicaid Other | Source: Ambulatory Visit

## 2015-06-30 DIAGNOSIS — D638 Anemia in other chronic diseases classified elsewhere: Secondary | ICD-10-CM | POA: Diagnosis not present

## 2015-06-30 DIAGNOSIS — I509 Heart failure, unspecified: Secondary | ICD-10-CM | POA: Diagnosis not present

## 2015-06-30 DIAGNOSIS — S30860A Insect bite (nonvenomous) of lower back and pelvis, initial encounter: Secondary | ICD-10-CM | POA: Diagnosis not present

## 2015-06-30 DIAGNOSIS — N189 Chronic kidney disease, unspecified: Secondary | ICD-10-CM | POA: Diagnosis not present

## 2015-06-30 DIAGNOSIS — J189 Pneumonia, unspecified organism: Secondary | ICD-10-CM | POA: Diagnosis not present

## 2015-06-30 DIAGNOSIS — E875 Hyperkalemia: Secondary | ICD-10-CM | POA: Diagnosis not present

## 2015-06-30 DIAGNOSIS — R749 Abnormal serum enzyme level, unspecified: Secondary | ICD-10-CM | POA: Diagnosis not present

## 2015-06-30 NOTE — Progress Notes (Signed)
Call to Dr. Dahlia Bailiff office, spoke with Tammy & she states that they were already aware of the pt.s' admission to [facility].  I informed her that she will be encouraged at d/c from [facility] to f/u with Dr. Zadie Rhine.

## 2015-06-30 NOTE — Progress Notes (Signed)
Noted in the chart that pt. Was admitted to [facility] for management of her pneumonia.  Call to [facility]: [facility] Rhonda W, spoke with Aldona Bar, the pt's nurse & she states that pt. Is stable & will probably be d/c'd to home later today. [name] to tell pt. That she needs to followup with her surgeon that has surgery schedueled for her on 07/04/2015

## 2015-06-30 NOTE — Telephone Encounter (Signed)
I appreciate the fyi  [name], MD [facility] 06/30/2015, 9:52 AM

## 2015-07-01 ENCOUNTER — Other Ambulatory Visit: Payer: Self-pay | Admitting: *Deleted

## 2015-07-01 MED ORDER — GABAPENTIN 300 MG PO CAPS: 300.0000 mg | ORAL_CAPSULE | Freq: Three times a day (TID) | ORAL | Status: DC

## 2015-07-01 MED ORDER — FUROSEMIDE 40 MG PO TABS: 80.0000 mg | ORAL_TABLET | Freq: Every day | ORAL | Status: DC

## 2015-07-01 MED ORDER — GLUCOSE BLOOD VI STRP: ORAL_STRIP | Status: DC

## 2015-07-04 ENCOUNTER — Encounter (HOSPITAL_COMMUNITY): Admission: RE | Payer: Self-pay | Source: Ambulatory Visit

## 2015-07-04 SURGERY — PARS PLANA VITRECTOMY WITH 25 GAUGE
Anesthesia: Monitor Anesthesia Care | Laterality: Right

## 2015-07-08 ENCOUNTER — Other Ambulatory Visit: Payer: Self-pay

## 2015-07-08 MED ORDER — HYDRALAZINE HCL 25 MG PO TABS: 25.0000 mg | ORAL_TABLET | Freq: Three times a day (TID) | ORAL | Status: DC

## 2015-07-12 ENCOUNTER — Ambulatory Visit: Payer: Medicaid Other | Admitting: Family Medicine

## 2015-07-13 DIAGNOSIS — Z720 Tobacco use: Secondary | ICD-10-CM | POA: Insufficient documentation

## 2015-07-13 DIAGNOSIS — N185 Chronic kidney disease, stage 5: Secondary | ICD-10-CM | POA: Insufficient documentation

## 2015-07-13 DIAGNOSIS — E1139 Type 2 diabetes mellitus with other diabetic ophthalmic complication: Secondary | ICD-10-CM | POA: Insufficient documentation

## 2015-07-27 ENCOUNTER — Emergency Department (HOSPITAL_COMMUNITY): Payer: Medicaid Other

## 2015-07-27 ENCOUNTER — Telehealth: Payer: Self-pay | Admitting: Family Medicine

## 2015-07-27 ENCOUNTER — Encounter (HOSPITAL_COMMUNITY): Payer: Self-pay | Admitting: Emergency Medicine

## 2015-07-27 ENCOUNTER — Emergency Department (HOSPITAL_COMMUNITY)
Admission: EM | Admit: 2015-07-27 | Discharge: 2015-07-27 | Disposition: A | Discharge status: Home / Self Care | Payer: Medicaid Other | Attending: Emergency Medicine | Admitting: Emergency Medicine

## 2015-07-27 DIAGNOSIS — F17210 Nicotine dependence, cigarettes, uncomplicated: Secondary | ICD-10-CM | POA: Diagnosis not present

## 2015-07-27 DIAGNOSIS — Z79899 Other long term (current) drug therapy: Secondary | ICD-10-CM | POA: Diagnosis not present

## 2015-07-27 DIAGNOSIS — E785 Hyperlipidemia, unspecified: Secondary | ICD-10-CM | POA: Diagnosis not present

## 2015-07-27 DIAGNOSIS — H2510 Age-related nuclear cataract, unspecified eye: Secondary | ICD-10-CM | POA: Insufficient documentation

## 2015-07-27 DIAGNOSIS — Z862 Personal history of diseases of the blood and blood-forming organs and certain disorders involving the immune mechanism: Secondary | ICD-10-CM | POA: Insufficient documentation

## 2015-07-27 DIAGNOSIS — M549 Dorsalgia, unspecified: Secondary | ICD-10-CM | POA: Insufficient documentation

## 2015-07-27 DIAGNOSIS — R197 Diarrhea, unspecified: Secondary | ICD-10-CM | POA: Diagnosis not present

## 2015-07-27 DIAGNOSIS — Z8701 Personal history of pneumonia (recurrent): Secondary | ICD-10-CM | POA: Diagnosis not present

## 2015-07-27 DIAGNOSIS — I129 Hypertensive chronic kidney disease with stage 1 through stage 4 chronic kidney disease, or unspecified chronic kidney disease: Secondary | ICD-10-CM | POA: Insufficient documentation

## 2015-07-27 DIAGNOSIS — Z8719 Personal history of other diseases of the digestive system: Secondary | ICD-10-CM | POA: Diagnosis not present

## 2015-07-27 DIAGNOSIS — I5030 Unspecified diastolic (congestive) heart failure: Secondary | ICD-10-CM

## 2015-07-27 DIAGNOSIS — E11319 Type 2 diabetes mellitus with unspecified diabetic retinopathy without macular edema: Secondary | ICD-10-CM | POA: Insufficient documentation

## 2015-07-27 DIAGNOSIS — J45909 Unspecified asthma, uncomplicated: Secondary | ICD-10-CM | POA: Insufficient documentation

## 2015-07-27 DIAGNOSIS — Z794 Long term (current) use of insulin: Secondary | ICD-10-CM | POA: Diagnosis not present

## 2015-07-27 DIAGNOSIS — R0602 Shortness of breath: Secondary | ICD-10-CM | POA: Diagnosis present

## 2015-07-27 DIAGNOSIS — N189 Chronic kidney disease, unspecified: Secondary | ICD-10-CM | POA: Diagnosis not present

## 2015-07-27 DIAGNOSIS — E1140 Type 2 diabetes mellitus with diabetic neuropathy, unspecified: Secondary | ICD-10-CM | POA: Insufficient documentation

## 2015-07-27 DIAGNOSIS — Z792 Long term (current) use of antibiotics: Secondary | ICD-10-CM | POA: Diagnosis not present

## 2015-07-27 DIAGNOSIS — R079 Chest pain, unspecified: Secondary | ICD-10-CM | POA: Diagnosis not present

## 2015-07-27 DIAGNOSIS — R2240 Localized swelling, mass and lump, unspecified lower limb: Secondary | ICD-10-CM | POA: Diagnosis not present

## 2015-07-27 DIAGNOSIS — M869 Osteomyelitis, unspecified: Secondary | ICD-10-CM | POA: Insufficient documentation

## 2015-07-27 LAB — COMPREHENSIVE METABOLIC PANEL
ALBUMIN: 3 g/dL — AB (ref 3.5–5.0)
ALK PHOS: 72 U/L (ref 38–126)
ALT: 24 U/L (ref 14–54)
ANION GAP: 7 (ref 5–15)
AST: 18 U/L (ref 15–41)
BUN: 51 mg/dL — ABNORMAL HIGH (ref 6–20)
CALCIUM: 8.6 mg/dL — AB (ref 8.9–10.3)
CO2: 21 mmol/L — AB (ref 22–32)
Chloride: 107 mmol/L (ref 101–111)
Creatinine, Ser: 4.67 mg/dL — ABNORMAL HIGH (ref 0.44–1.00)
GFR calc non Af Amer: 10 mL/min — ABNORMAL LOW (ref >60)
GFR, EST AFRICAN AMERICAN: 12 mL/min — AB (ref >60)
Glucose, Bld: 228 mg/dL — ABNORMAL HIGH (ref 65–99)
POTASSIUM: 5.2 mmol/L — AB (ref 3.5–5.1)
SODIUM: 135 mmol/L (ref 135–145)
TOTAL PROTEIN: 6.5 g/dL (ref 6.5–8.1)
Total Bilirubin: 0.5 mg/dL (ref 0.3–1.2)

## 2015-07-27 LAB — CBC WITH DIFFERENTIAL/PLATELET
BASOS PCT: 1 %
Basophils Absolute: 0.1 10*3/uL (ref 0.0–0.1)
EOS ABS: 0.2 10*3/uL (ref 0.0–0.7)
EOS PCT: 2 %
HCT: 27.8 % — ABNORMAL LOW (ref 36.0–46.0)
HEMOGLOBIN: 9.3 g/dL — AB (ref 12.0–15.0)
LYMPHS ABS: 1.2 10*3/uL (ref 0.7–4.0)
Lymphocytes Relative: 13 %
MCH: 31.7 pg (ref 26.0–34.0)
MCHC: 33.5 g/dL (ref 30.0–36.0)
MCV: 94.9 fL (ref 78.0–100.0)
MONO ABS: 0.7 10*3/uL (ref 0.1–1.0)
MONOS PCT: 8 %
Neutro Abs: 7.3 10*3/uL (ref 1.7–7.7)
Neutrophils Relative %: 76 %
PLATELETS: 308 10*3/uL (ref 150–400)
RBC: 2.93 MIL/uL — ABNORMAL LOW (ref 3.87–5.11)
RDW: 14.3 % (ref 11.5–15.5)
WBC: 9.5 10*3/uL (ref 4.0–10.5)

## 2015-07-27 LAB — URINALYSIS, ROUTINE W REFLEX MICROSCOPIC
BILIRUBIN URINE: NEGATIVE
Glucose, UA: 500 mg/dL — AB
Ketones, ur: NEGATIVE mg/dL
NITRITE: NEGATIVE
Specific Gravity, Urine: 1.02 (ref 1.005–1.030)
pH: 6.5 (ref 5.0–8.0)

## 2015-07-27 LAB — URINE MICROSCOPIC-ADD ON

## 2015-07-27 LAB — TROPONIN I: Troponin I: <0.03 ng/mL (ref <0.031)

## 2015-07-27 LAB — BRAIN NATRIURETIC PEPTIDE: B NATRIURETIC PEPTIDE 5: 675 pg/mL — AB (ref 0.0–100.0)

## 2015-07-27 MED ORDER — ASPIRIN 81 MG PO CHEW
324.0000 mg | CHEWABLE_TABLET | Freq: Once | ORAL | Status: AC
  Administered 2015-07-27: 324 mg via ORAL
  Filled 2015-07-27: qty 4

## 2015-07-27 MED ORDER — SODIUM POLYSTYRENE SULFONATE 15 GM/60ML PO SUSP
30.0000 g | Freq: Once | ORAL | Status: AC
  Administered 2015-07-27: 30 g via ORAL
  Filled 2015-07-27: qty 120

## 2015-07-27 MED ORDER — FUROSEMIDE 10 MG/ML IJ SOLN
40.0000 mg | Freq: Once | INTRAMUSCULAR | Status: AC
  Administered 2015-07-27: 40 mg via INTRAVENOUS
  Filled 2015-07-27: qty 4

## 2015-07-27 NOTE — ED Provider Notes (Signed)
CSN: [id number]     Arrival date & time 07/27/15  1003 History  By signing my name below, I, Eustaquio Maize, attest that this documentation has been prepared under the direction and in the presence of Ezequiel Essex, MD. Electronically Signed: Eustaquio Maize, ED Scribe. 07/27/2015. 10:36 AM.   Chief Complaint  Patient presents with  . Shortness of Breath   The history is provided by the patient. No language interpreter was used.     HPI Comments: Kara Knapp is a 44 y.o. female with hx DM, HTN, HLD, CKD, and CHF who presents to the Emergency Department complaining of gradual onset, constant, worsening, shortness of breath x 3-4 days. Pt also complains of dry cough and upper back pain while coughing x 1 week. No recent fall, trauma, or injury that could have caused the back pain but she thinks she may have hurt it while vacuuming. Pt mentions having diarrhea a couple of days ago as well as mild chest pain with coughing. She is currently chest pain free. She is also having worsening bilateral leg swelling.  She is concerned that she is retaining fluid. She is currently on Lasix and states she is being complaint with it. Pt was recently admitted to [facility] 2-3 weeks ago for pneumonia. She was given antibiotics but states she did not finish the course due to her pharmacist telling her she should stop taking them. Pt cannot say what the reason was for stopping the medication. Denies fever, chills, nausea, vomiting, abdominal pain, weakness in upper or lower extremities, or any other associated symptoms. Pt is current everyday smoker. No hx DVT/PE.   Past Medical History  Diagnosis Date  . Insulin-dependent diabetes mellitus with retinopathy ([place])   . Asthma   . Hypertension   . Osteomyelitis ([place])     left middle toe   . Cataract   . Bronchitis   . Pneumonia   . Depression   . Neuropathy due to secondary diabetes mellitus ([place])   . Hyperlipidemia   . Fibroids   . Anemia of  chronic disease   . Chronic kidney disease     Stage III-IV  . CHF (congestive heart failure) (HCC)     Presumed diastolic  . Cholecystitis, acute 05/26/2013    Status post cholecystectomy  . Tobacco abuse    Past Surgical History  Procedure Laterality Date  . Cesarean section    . Tubal ligation    . Cholecystectomy N/A 05/25/2013    Procedure: LAPAROSCOPIC CHOLECYSTECTOMY;  Surgeon: Jamesetta So, MD;  Location: [facility];  Service: General;  Laterality: N/A;  . Eye surgery    . Pars plana vitrectomy Left 11/24/2014    Procedure: PARS PLANA VITRECTOMY WITH 25 GAUGE;  Surgeon: Hurman Horn, MD;  Location: [place];  Service: Ophthalmology;  Laterality: Left;  . Photocoagulation with laser Left 11/24/2014    Procedure: PHOTOCOAGULATION WITH LASER;  Surgeon: Hurman Horn, MD;  Location: [place];  Service: Ophthalmology;  Laterality: Left;  with insertion of silicone oil  . Peripheral vascular catheterization N/A 04/28/2015    Procedure: Bilateral Upper Extremity Venography;  Surgeon: Conrad Bloomington, MD;  Location: [place] [facility];  Service: Cardiovascular;  Laterality: N/A;   Family History  Problem Relation Age of Onset  . COPD Mother   . Cancer Father   . Diabetes Sister   . Deep vein thrombosis Sister   . Diabetes Brother   . Hyperlipidemia Brother   . Hypertension  Brother   . Mental retardation Sister    Social History  Substance Use Topics  . Smoking status: Current Every Day Smoker -- 1.00 packs/day for 20 years    Types: Cigarettes  . Smokeless tobacco: Never Used  . Alcohol Use: No   OB History    Gravida Para Term Preterm AB TAB SAB Ectopic Multiple Living   5 5             Review of Systems  Constitutional: Negative for fever and chills.  Respiratory: Positive for cough and shortness of breath.   Cardiovascular: Positive for chest pain and leg swelling.  Gastrointestinal: Positive for diarrhea. Negative for nausea, vomiting and abdominal pain.  Musculoskeletal:  Positive for back pain.  A complete 10 system review of systems was obtained and all systems are negative except as noted in the HPI and PMH.    Allergies  Bactrim and Prednisone  Home Medications   Prior to Admission medications   Medication Sig Start Date End Date Taking? Authorizing Provider  B Complex-C-Folic Acid (B COMPLEX-VITAMIN C-FOLIC ACID) 1 MG tablet Take 1 tablet by mouth daily with breakfast. 01/23/15  Yes Rexene Alberts, MD  brimonidine-timolol (COMBIGAN) 0.2-0.5 % ophthalmic solution Place 1 drop into the right eye every 12 (twelve) hours.   Yes Historical Provider, MD  calcium acetate (PHOSLO) 667 MG capsule Take 1,334 mg by mouth 3 (three) times daily with meals.   Yes Historical Provider, MD  carvedilol (COREG) 6.25 MG tablet Take 1 tablet (6.25 mg total) by mouth 2 (two) times daily with a meal. Blood pressure medication. 06/01/15  Yes Timmothy Euler, MD  cholecalciferol (VITAMIN D) 1000 units tablet Take 1,000 Units by mouth daily.   Yes Historical Provider, MD  citalopram (CELEXA) 20 MG tablet Take 20 mg by mouth daily. 10/20/14  Yes Historical Provider, MD  furosemide (LASIX) 40 MG tablet Take 2 tablets (80 mg total) by mouth daily. 07/01/15  Yes Fransisca Kaufmann Dettinger, MD  gabapentin (NEURONTIN) 300 MG capsule Take 1 capsule (300 mg total) by mouth 3 (three) times daily. 07/01/15  Yes Fransisca Kaufmann Dettinger, MD  HUMALOG KWIKPEN 100 UNIT/ML KiwkPen INJECT 7-10 UNITS INTO THE SKIN THREE TIMES DAILY. 05/31/15  Yes Timmothy Euler, MD  hydrALAZINE (APRESOLINE) 25 MG tablet Take 1 tablet (25 mg total) by mouth 3 (three) times daily. 07/08/15  Yes Fransisca Kaufmann Dettinger, MD  ibuprofen (ADVIL,MOTRIN) 200 MG tablet Take 600 mg by mouth every 6 (six) hours as needed for moderate pain.   Yes Historical Provider, MD  LANTUS SOLOSTAR 100 UNIT/ML Solostar Pen INJECT 15 UNITS AT BEDTIME Patient taking differently: inject 15 units every morning 06/08/15  Yes Timmothy Euler, MD  ofloxacin  (OCUFLOX) 0.3 % ophthalmic solution Place 1 drop into the right eye 4 (four) times daily.   Yes Historical Provider, MD  simvastatin (ZOCOR) 20 MG tablet TAKE ONE TABLET BY MOUTH DAILY AT 6 PM 06/28/15  Yes Fransisca Kaufmann Dettinger, MD  blood glucose meter kit and supplies KIT Podigy Voice GLucometer - patient is visually impaired and needs meter that reads results.  Use to check BG up to QID.  Dx: E11.319 Z79.4 05/14/15   Tammy Eckard, PHARMD  glucose blood (ACCU-CHEK AVIVA) test strip Test up to qid. Dx R00.762 07/01/15   Fransisca Kaufmann Dettinger, MD   Triage Vitals: BP 180/80 mmHg  Pulse 72  Temp(Src) 97.8 F (36.6 C) (Oral)  Resp 22  Ht 5' 3"  (1.6 m)  Wt 188 lb (85.276 kg)  BMI 33.31 kg/m2  SpO2 97%   Physical Exam  Constitutional: She is oriented to person, place, and time. She appears well-developed and well-nourished. No distress.  HENT:  Head: Normocephalic and atraumatic.  Mouth/Throat: Oropharynx is clear and moist. No oropharyngeal exudate.  Eyes: Conjunctivae and EOM are normal. Pupils are equal, round, and reactive to light.  Neck: Normal range of motion. Neck supple.  No meningismus.  Cardiovascular: Normal rate, regular rhythm, normal heart sounds and intact distal pulses.   No murmur heard. Pulmonary/Chest: Effort normal. No respiratory distress.  Speaking in full sentences. Diminished breath sounds at the bases.  Abdominal: Soft. There is no tenderness. There is no rebound and no guarding.  Musculoskeletal: Normal range of motion. She exhibits edema and tenderness.  Paraspinal thoracic tenderness. Trace pedal edema bilaterally.   Neurological: She is alert and oriented to person, place, and time. No cranial nerve deficit. She exhibits normal muscle tone. Coordination normal.  No ataxia on finger to nose bilaterally. No pronator drift. 5/5 strength throughout. CN 2-12 intact.Equal grip strength. Sensation intact.   Skin: Skin is warm.  Psychiatric: She has a normal mood and  affect. Her behavior is normal.  Nursing note and vitals reviewed.   ED Course  Procedures (including critical care time)  DIAGNOSTIC STUDIES: Oxygen Saturation is 97% on RA, normal by my interpretation.    COORDINATION OF CARE: 10:33 AM-Discussed treatment plan which includes CXR, UA, CBC, CMP, Troponin, and BNP with pt at bedside and pt agreed to plan.   Labs Review Labs Reviewed  CBC WITH DIFFERENTIAL/PLATELET - Abnormal; Notable for the following:    RBC 2.93 (*)    Hemoglobin 9.3 (*)    HCT 27.8 (*)    All other components within normal limits  COMPREHENSIVE METABOLIC PANEL - Abnormal; Notable for the following:    Potassium 5.2 (*)    CO2 21 (*)    Glucose, Bld 228 (*)    BUN 51 (*)    Creatinine, Ser 4.67 (*)    Calcium 8.6 (*)    Albumin 3.0 (*)    GFR calc non Af Amer 10 (*)    GFR calc Af Amer 12 (*)    All other components within normal limits  BRAIN NATRIURETIC PEPTIDE - Abnormal; Notable for the following:    B Natriuretic Peptide 675.0 (*)    All other components within normal limits  URINALYSIS, ROUTINE W REFLEX MICROSCOPIC (NOT AT [facility]) - Abnormal; Notable for the following:    Glucose, UA 500 (*)    Hgb urine dipstick SMALL (*)    Protein, ur >300 (*)    Leukocytes, UA TRACE (*)    All other components within normal limits  URINE MICROSCOPIC-ADD ON - Abnormal; Notable for the following:    Squamous Epithelial / LPF 6-30 (*)    Bacteria, UA FEW (*)    All other components within normal limits  TROPONIN I    Imaging Review Dg Chest 2 View  07/27/2015  CLINICAL DATA:  Shortness of breath for 4 days.  Cough for 1 week EXAM: CHEST  2 VIEW COMPARISON:  June 29, 2015 FINDINGS: There is interstitial edema. No airspace consolidation. Heart is mildly enlarged with slight pulmonary venous hypertension. No adenopathy. No bone lesions. IMPRESSION: Evidence of a degree of congestive heart failure. No airspace consolidation. Electronically Signed   By: Lowella Grip III M.D.   On: 07/27/2015 11:29   I have personally  reviewed and evaluated these images and lab results as part of my medical decision-making.   EKG Interpretation   Date/Time:  Wednesday July 27 2015 10:20:09 EST Ventricular Rate:  72 PR Interval:  151 QRS Duration: 89 QT Interval:  432 QTC Calculation: 473 R Axis:   82 Text Interpretation:  Sinus rhythm No significant change was found  Confirmed by Wyvonnia Dusky  MD, Kirke Breach [phone]) on 07/27/2015 10:53:43 AM      MDM   Final diagnoses:  Chronic kidney disease, unspecified stage  Diastolic congestive heart failure, unspecified congestive heart failure chronicity ([place])   patient with shortness of breath worsening over the past 2 or 3 days with upper back pain and leg swelling. Diagnosed with pneumonia at [facility] about 2 weeks ago and did not finish prescription. States near dialysis but not yet on it. Saw cardiologist 2 weeks ago and is being scheduled for stress test. Normal EF by report.  Labs show potassium of 5.2 without hyperkalemic changes an EKG. Creatinine is 4.6 which is slightly worse than her baseline around 4.3.  Patient ambulatory without desaturation. She denies any shortness of breath or chest pain. There is no hypoxia.  PERC negative. Doubt PE. No PNA.  Suspect shortness of breath due to fluid overload due to CHF and renal failure. Her labs appear to be at baseline. There is no hypoxia with ambulation. There is no indication for emergent dialysis.  D/w her PCP Dr. Livia Snellen, covering for Dr. Wendi Snipes.  He agrees to see patient in office tomorrow for recheck of labs. Kayexalate given here. IM lasix.  Followup with PCP tomorrow. Return precautions discussed.  Advised patient she is progressing toward needing dialysis and needs nephrology followup as well.    I personally performed the services described in this documentation, which was scribed in my presence. The recorded information has been  reviewed and is accurate.      Ezequiel Essex, MD 07/27/15 [phone]

## 2015-07-27 NOTE — ED Notes (Signed)
Patient transported to X-ray 

## 2015-07-27 NOTE — ED Notes (Addendum)
4 yof PMHx DM, CHF, HTN, CKD was recently admitted [facility]) for pneumonia presents with c/c progressive worsening of shortness of breath over the past 2-3 days with non-productive cough and increase in leg swelling bilaterally.  States she was given an antibiotic "M" but stopped it after 4 doses secondary to a reaction and hasn't been on an abx since.  +intermittent chest and back pain worsens with cough.  Denies fever.

## 2015-07-27 NOTE — Discharge Instructions (Signed)
Heart Failure Take your Lasix as prescribed. Your kidney function is worsening and he will likely need dialysis. Call your doctor for appointment in the office tomorrow. Return to the ED if he develop worsening shortness of breath, chest pain or any other concerns. Heart failure is a condition in which the heart has trouble pumping blood. This means your heart does not pump blood efficiently for your body to work well. In some cases of heart failure, fluid may back up into your lungs or you may have swelling (edema) in your lower legs. Heart failure is usually a long-term (chronic) condition. It is important for you to take good care of yourself and follow your health care provider's treatment plan. CAUSES  Some health conditions can cause heart failure. Those health conditions include:  High blood pressure (hypertension). Hypertension causes the heart muscle to work harder than normal. When pressure in the blood vessels is high, the heart needs to pump (contract) with more force in order to circulate blood throughout the body. High blood pressure eventually causes the heart to become stiff and weak.  Coronary artery disease (CAD). CAD is the buildup of cholesterol and fat (plaque) in the arteries of the heart. The blockage in the arteries deprives the heart muscle of oxygen and blood. This can cause chest pain and may lead to a heart attack. High blood pressure can also contribute to CAD.  Heart attack (myocardial infarction). A heart attack occurs when one or more arteries in the heart become blocked. The loss of oxygen damages the muscle tissue of the heart. When this happens, part of the heart muscle dies. The injured tissue does not contract as well and weakens the heart's ability to pump blood.  Abnormal heart valves. When the heart valves do not open and close properly, it can cause heart failure. This makes the heart muscle pump harder to keep the blood flowing.  Heart muscle disease  (cardiomyopathy or myocarditis). Heart muscle disease is damage to the heart muscle from a variety of causes. These can include drug or alcohol abuse, infections, or unknown reasons. These can increase the risk of heart failure.  Lung disease. Lung disease makes the heart work harder because the lungs do not work properly. This can cause a strain on the heart, leading it to fail.  Diabetes. Diabetes increases the risk of heart failure. High blood sugar contributes to high fat (lipid) levels in the blood. Diabetes can also cause slow damage to tiny blood vessels that carry important nutrients to the heart muscle. When the heart does not get enough oxygen and food, it can cause the heart to become weak and stiff. This leads to a heart that does not contract efficiently.  Other conditions can contribute to heart failure. These include abnormal heart rhythms, thyroid problems, and low blood counts (anemia). Certain unhealthy behaviors can increase the risk of heart failure, including:  Being overweight.  Smoking or chewing tobacco.  Eating foods high in fat and cholesterol.  Abusing illicit drugs or alcohol.  Lacking physical activity. SYMPTOMS  Heart failure symptoms may vary and can be hard to detect. Symptoms may include:  Shortness of breath with activity, such as climbing stairs.  Persistent cough.  Swelling of the feet, ankles, legs, or abdomen.  Unexplained weight gain.  Difficulty breathing when lying flat (orthopnea).  Waking from sleep because of the need to sit up and get more air.  Rapid heartbeat.  Fatigue and loss of energy.  Feeling light-headed, dizzy, or  close to fainting.  Loss of appetite.  Nausea.  Increased urination during the night (nocturia). DIAGNOSIS  A diagnosis of heart failure is based on your history, symptoms, physical examination, and diagnostic tests. Diagnostic tests for heart failure may  include:  Echocardiography.  Electrocardiography.  Chest X-ray.  Blood tests.  Exercise stress test.  Cardiac angiography.  Radionuclide scans. TREATMENT  Treatment is aimed at managing the symptoms of heart failure. Medicines, behavioral changes, or surgical intervention may be necessary to treat heart failure.  Medicines to help treat heart failure may include:  Angiotensin-converting enzyme (ACE) inhibitors. This type of medicine blocks the effects of a blood protein called angiotensin-converting enzyme. ACE inhibitors relax (dilate) the blood vessels and help lower blood pressure.  Angiotensin receptor blockers (ARBs). This type of medicine blocks the actions of a blood protein called angiotensin. Angiotensin receptor blockers dilate the blood vessels and help lower blood pressure.  Water pills (diuretics). Diuretics cause the kidneys to remove salt and water from the blood. The extra fluid is removed through urination. This loss of extra fluid lowers the volume of blood the heart pumps.  Beta blockers. These prevent the heart from beating too fast and improve heart muscle strength.  Digitalis. This increases the force of the heartbeat.  Healthy behavior changes include:  Obtaining and maintaining a healthy weight.  Stopping smoking or chewing tobacco.  Eating heart-healthy foods.  Limiting or avoiding alcohol.  Stopping illicit drug use.  Physical activity as directed by your health care provider.  Surgical treatment for heart failure may include:  A procedure to open blocked arteries, repair damaged heart valves, or remove damaged heart muscle tissue.  A pacemaker to improve heart muscle function and control certain abnormal heart rhythms.  An internal cardioverter defibrillator to treat certain serious abnormal heart rhythms.  A left ventricular assist device (LVAD) to assist the pumping ability of the heart. HOME CARE INSTRUCTIONS   Take medicines only  as directed by your health care provider. Medicines are important in reducing the workload of your heart, slowing the progression of heart failure, and improving your symptoms.  Do not stop taking your medicine unless directed by your health care provider.  Do not skip any dose of medicine.  Refill your prescriptions before you run out of medicine. Your medicines are needed every day.  Engage in moderate physical activity if directed by your health care provider. Moderate physical activity can benefit some people. The elderly and people with severe heart failure should consult with a health care provider for physical activity recommendations.  Eat heart-healthy foods. Food choices should be free of trans fat and low in saturated fat, cholesterol, and salt (sodium). Healthy choices include fresh or frozen fruits and vegetables, fish, lean meats, legumes, fat-free or low-fat dairy products, and whole grain or high fiber foods. Talk to a dietitian to learn more about heart-healthy foods.  Limit sodium if directed by your health care provider. Sodium restriction may reduce symptoms of heart failure in some people. Talk to a dietitian to learn more about heart-healthy seasonings.  Use healthy cooking methods. Healthy cooking methods include roasting, grilling, broiling, baking, poaching, steaming, or stir-frying. Talk to a dietitian to learn more about healthy cooking methods.  Limit fluids if directed by your health care provider. Fluid restriction may reduce symptoms of heart failure in some people.  Weigh yourself every day. Daily weights are important in the early recognition of excess fluid. You should weigh yourself every morning after you urinate  and before you eat breakfast. Wear the same amount of clothing each time you weigh yourself. Record your daily weight. Provide your health care provider with your weight record.  Monitor and record your blood pressure if directed by your health care  provider.  Check your pulse if directed by your health care provider.  Lose weight if directed by your health care provider. Weight loss may reduce symptoms of heart failure in some people.  Stop smoking or chewing tobacco. Nicotine makes your heart work harder by causing your blood vessels to constrict. Do not use nicotine gum or patches before talking to your health care provider.  Keep all follow-up visits as directed by your health care provider. This is important.  Limit alcohol intake to no more than 1 drink per day for nonpregnant women and 2 drinks per day for men. One drink equals 12 ounces of beer, 5 ounces of wine, or 1 ounces of hard liquor. Drinking more than that is harmful to your heart. Tell your health care provider if you drink alcohol several times a week. Talk with your health care provider about whether alcohol is safe for you. If your heart has already been damaged by alcohol or you have severe heart failure, drinking alcohol should be stopped completely.  Stop illicit drug use.  Stay up-to-date with immunizations. It is especially important to prevent respiratory infections through current pneumococcal and influenza immunizations.  Manage other health conditions such as hypertension, diabetes, thyroid disease, or abnormal heart rhythms as directed by your health care provider.  Learn to manage stress.  Plan rest periods when fatigued.  Learn strategies to manage high temperatures. If the weather is extremely hot:  Avoid vigorous physical activity.  Use air conditioning or fans or seek a cooler location.  Avoid caffeine and alcohol.  Wear loose-fitting, lightweight, and light-colored clothing.  Learn strategies to manage cold temperatures. If the weather is extremely cold:  Avoid vigorous physical activity.  Layer clothes.  Wear mittens or gloves, a hat, and a scarf when going outside.  Avoid alcohol.  Obtain ongoing education and support as  needed.  Participate in or seek rehabilitation as needed to maintain or improve independence and quality of life. SEEK MEDICAL CARE IF:   You have a rapid weight gain.  You have increasing shortness of breath that is unusual for you.  You are unable to participate in your usual physical activities.  You tire easily.  You cough more than normal, especially with physical activity.  You have any or more swelling in areas such as your hands, feet, ankles, or abdomen.  You are unable to sleep because it is hard to breathe.  You feel like your heart is beating fast (palpitations).  You become dizzy or light-headed upon standing up. SEEK IMMEDIATE MEDICAL CARE IF:   You have difficulty breathing.  There is a change in mental status such as decreased alertness or difficulty with concentration.  You have a pain or discomfort in your chest.  You have an episode of fainting (syncope). MAKE SURE YOU:   Understand these instructions.  Will watch your condition.  Will get help right away if you are not doing well or get worse.   This information is not intended to replace advice given to you by your health care provider. Make sure you discuss any questions you have with your health care provider.   Document Released: 07/16/2005 Document Revised: 11/30/2014 Document Reviewed: 08/15/2012 Elsevier Interactive Patient Education [employer].

## 2015-07-27 NOTE — Telephone Encounter (Signed)
Has an appt with Dr Wendi Snipes but was released from hospital today and they were insistant that she be seen by this Friday. Made an appt with Dettinger for this Friday.

## 2015-07-28 ENCOUNTER — Telehealth: Payer: Self-pay | Admitting: Family Medicine

## 2015-07-28 NOTE — Telephone Encounter (Signed)
Patient aware that letter was faxed and patient states that she has a ride now.

## 2015-07-29 ENCOUNTER — Encounter: Payer: Self-pay | Admitting: Family Medicine

## 2015-07-29 ENCOUNTER — Ambulatory Visit (INDEPENDENT_AMBULATORY_CARE_PROVIDER_SITE_OTHER): Payer: Medicaid Other | Admitting: Family Medicine

## 2015-07-29 VITALS — BP 193/90 | HR 69 | Temp 97.5°F | Ht 63.0 in | Wt 192.8 lb

## 2015-07-29 DIAGNOSIS — N189 Chronic kidney disease, unspecified: Secondary | ICD-10-CM | POA: Diagnosis not present

## 2015-07-29 DIAGNOSIS — I10 Essential (primary) hypertension: Secondary | ICD-10-CM | POA: Diagnosis not present

## 2015-07-29 DIAGNOSIS — I5033 Acute on chronic diastolic (congestive) heart failure: Secondary | ICD-10-CM

## 2015-07-29 DIAGNOSIS — N179 Acute kidney failure, unspecified: Secondary | ICD-10-CM

## 2015-07-29 NOTE — Progress Notes (Signed)
BP 193/90 mmHg  Pulse 69  Temp(Src) 97.5 F (36.4 C) (Oral)  Ht 5' 3"  (1.6 m)  Wt 192 lb 12.8 oz (87.454 kg)  BMI 34.16 kg/m2   Subjective:    Patient ID: Kara Knapp, female    DOB: [birth year], 44 y.o.   MRN: [id number]  HPI: Kara Knapp is a 44 y.o. female presenting on 07/29/2015 for Hospital followup   HPI Hypertension Patient admits that she did not take blood pressure medications today and her blood pressure quite elevated. We will monitor and recheck at next visit.  Cardiorenal syndrome exacerbation Patient was in the hospital for CHF exacerbation with chronic kidney disease. She has been fighting fluid overload because of her kidneys and her heart. They gave her IV Lasix at the hospital and she lost her in the feeling a lot better. He still says she is doing she has gained 4 pounds since leaving the hospital. She was in the hospital to get her connected with CHF team and are planning to help her monitor her weights.  Relevant past medical, surgical, family and social history reviewed and updated as indicated. Interim medical history since our last visit reviewed. Allergies and medications reviewed and updated.  Review of Systems  Constitutional: Negative for fever and chills.  HENT: Negative for congestion, ear discharge and ear pain.   Eyes: Negative for redness and visual disturbance.  Respiratory: Positive for cough and shortness of breath. Negative for chest tightness.   Cardiovascular: Positive for leg swelling. Negative for chest pain.  Genitourinary: Negative for dysuria and difficulty urinating.  Musculoskeletal: Negative for back pain and gait problem.  Skin: Negative for rash.  Neurological: Negative for light-headedness and headaches.  Psychiatric/Behavioral: Negative for behavioral problems and agitation.  All other systems reviewed and are negative.   Per HPI unless specifically indicated above     Medication List       This list is  accurate as of: 07/29/15  3:05 PM.  Always use your most recent med list.               B complex-vitamin C-folic acid 1 MG tablet  Take 1 tablet by mouth daily with breakfast.     blood glucose meter kit and supplies Kit  Podigy Voice GLucometer - patient is visually impaired and needs meter that reads results.  Use to check BG up to QID.  Dx: E11.319 Z79.4     calcium acetate 667 MG capsule  Commonly known as:  PHOSLO  Take 1,334 mg by mouth 3 (three) times daily with meals.     carvedilol 6.25 MG tablet  Commonly known as:  COREG  Take 1 tablet (6.25 mg total) by mouth 2 (two) times daily with a meal. Blood pressure medication.     cholecalciferol 1000 units tablet  Commonly known as:  VITAMIN D  Take 1,000 Units by mouth daily.     citalopram 20 MG tablet  Commonly known as:  CELEXA  Take 20 mg by mouth daily.     COMBIGAN 0.2-0.5 % ophthalmic solution  Generic drug:  brimonidine-timolol  Place 1 drop into the right eye every 12 (twelve) hours.     furosemide 40 MG tablet  Commonly known as:  LASIX  Take 2 tablets (80 mg total) by mouth daily.     gabapentin 300 MG capsule  Commonly known as:  NEURONTIN  Take 1 capsule (300 mg total) by mouth 3 (three) times daily.  glucose blood test strip  Commonly known as:  ACCU-CHEK AVIVA  Test up to qid. Dx E11.319     HUMALOG KWIKPEN 100 UNIT/ML KiwkPen  Generic drug:  insulin lispro  INJECT 7-10 UNITS INTO THE SKIN THREE TIMES DAILY.     hydrALAZINE 25 MG tablet  Commonly known as:  APRESOLINE  Take 1 tablet (25 mg total) by mouth 3 (three) times daily.     ibuprofen 200 MG tablet  Commonly known as:  ADVIL,MOTRIN  Take 600 mg by mouth every 6 (six) hours as needed for moderate pain. Reported on 07/29/2015     LANTUS SOLOSTAR 100 UNIT/ML Solostar Pen  Generic drug:  Insulin Glargine  INJECT 15 UNITS AT BEDTIME     ofloxacin 0.3 % ophthalmic solution  Commonly known as:  OCUFLOX  Place 1 drop into the right  eye 4 (four) times daily.     simvastatin 20 MG tablet  Commonly known as:  ZOCOR  TAKE ONE TABLET BY MOUTH DAILY AT 6 PM           Objective:    BP 193/90 mmHg  Pulse 69  Temp(Src) 97.5 F (36.4 C) (Oral)  Ht 5' 3"  (1.6 m)  Wt 192 lb 12.8 oz (87.454 kg)  BMI 34.16 kg/m2  Wt Readings from Last 3 Encounters:  07/29/15 192 lb 12.8 oz (87.454 kg)  07/27/15 188 lb (85.276 kg)  06/26/15 188 lb (85.276 kg)    Physical Exam  Constitutional: She is oriented to person, place, and time. She appears well-developed and well-nourished. No distress.  Eyes: Conjunctivae and EOM are normal. Pupils are equal, round, and reactive to light.  Neck: Neck supple. No thyromegaly present.  Cardiovascular: Normal rate, regular rhythm, normal heart sounds and intact distal pulses.   No murmur heard. Pulmonary/Chest: Effort normal. No respiratory distress. She has no wheezes. She has rales (bibasally crackles).  Musculoskeletal: Normal range of motion. She exhibits no edema or tenderness.  Lymphadenopathy:    She has no cervical adenopathy.  Neurological: She is alert and oriented to person, place, and time. No cranial nerve deficit. Coordination normal.  Skin: Skin is warm and dry. No rash noted. She is not diaphoretic.  Psychiatric: She has a normal mood and affect. Her behavior is normal.  Nursing note and vitals reviewed.   Results for orders placed or performed during the hospital encounter of 07/27/15  CBC with Differential/Platelet  Result Value Ref Range   WBC 9.5 4.0 - 10.5 K/uL   RBC 2.93 (L) 3.87 - 5.11 MIL/uL   Hemoglobin 9.3 (L) 12.0 - 15.0 g/dL   HCT 27.8 (L) 36.0 - 46.0 %   MCV 94.9 78.0 - 100.0 fL   MCH 31.7 26.0 - 34.0 pg   MCHC 33.5 30.0 - 36.0 g/dL   RDW 14.3 11.5 - 15.5 %   Platelets 308 150 - 400 K/uL   Neutrophils Relative % 76 %   Neutro Abs 7.3 1.7 - 7.7 K/uL   Lymphocytes Relative 13 %   Lymphs Abs 1.2 0.7 - 4.0 K/uL   Monocytes Relative 8 %   Monocytes Absolute  0.7 0.1 - 1.0 K/uL   Eosinophils Relative 2 %   Eosinophils Absolute 0.2 0.0 - 0.7 K/uL   Basophils Relative 1 %   Basophils Absolute 0.1 0.0 - 0.1 K/uL  Comprehensive metabolic panel  Result Value Ref Range   Sodium 135 135 - 145 mmol/L   Potassium 5.2 (H) 3.5 - 5.1 mmol/L  Chloride 107 101 - 111 mmol/L   CO2 21 (L) 22 - 32 mmol/L   Glucose, Bld 228 (H) 65 - 99 mg/dL   BUN 51 (H) 6 - 20 mg/dL   Creatinine, Ser 4.67 (H) 0.44 - 1.00 mg/dL   Calcium 8.6 (L) 8.9 - 10.3 mg/dL   Total Protein 6.5 6.5 - 8.1 g/dL   Albumin 3.0 (L) 3.5 - 5.0 g/dL   AST 18 15 - 41 U/L   ALT 24 14 - 54 U/L   Alkaline Phosphatase 72 38 - 126 U/L   Total Bilirubin 0.5 0.3 - 1.2 mg/dL   GFR calc non Af Amer 10 (L) >60 mL/min   GFR calc Af Amer 12 (L) >60 mL/min   Anion gap 7 5 - 15  Troponin I  Result Value Ref Range   Troponin I <0.03 <0.031 ng/mL  Brain natriuretic peptide  Result Value Ref Range   B Natriuretic Peptide 675.0 (H) 0.0 - 100.0 pg/mL  Urinalysis, Routine w reflex microscopic (not at [facility])  Result Value Ref Range   Color, Urine YELLOW YELLOW   APPearance CLEAR CLEAR   Specific Gravity, Urine 1.020 1.005 - 1.030   pH 6.5 5.0 - 8.0   Glucose, UA 500 (A) NEGATIVE mg/dL   Hgb urine dipstick SMALL (A) NEGATIVE   Bilirubin Urine NEGATIVE NEGATIVE   Ketones, ur NEGATIVE NEGATIVE mg/dL   Protein, ur >300 (A) NEGATIVE mg/dL   Nitrite NEGATIVE NEGATIVE   Leukocytes, UA TRACE (A) NEGATIVE  Urine microscopic-add on  Result Value Ref Range   Squamous Epithelial / LPF 6-30 (A) NONE SEEN   WBC, UA 6-30 0 - 5 WBC/hpf   RBC / HPF 6-30 0 - 5 RBC/hpf   Bacteria, UA FEW (A) NONE SEEN      Assessment & Plan:   Problem List Items Addressed This Visit      Cardiovascular and Mediastinum   Essential hypertension, benign    Did not take blood pressure medications and that is why his elevated      Acute on chronic diastolic heart failure [facility])    Patient was in hospital for congestive heart  failure exacerbation. She is doing a lot better now after they gave her the IV Lasix hospital. She has gained or pounds leaving the hospital.        Genitourinary   Acute on chronic kidney failure ([place]) - Primary       Follow up plan: Return in about 2 weeks (around 08/12/2015), or if symptoms worsen or fail to improve, for f/u CHF with Dr Wendi Snipes.  Counseling provided for all of the vaccine components No orders of the defined types were placed in this encounter.    Caryl Pina, MD [facility] 07/29/2015, 3:05 PM

## 2015-07-30 NOTE — Assessment & Plan Note (Signed)
Did not take blood pressure medications and that is why his elevated

## 2015-07-30 NOTE — Assessment & Plan Note (Signed)
Patient was in hospital for congestive heart failure exacerbation. She is doing a lot better now after they gave her the IV Lasix hospital. She has gained or pounds leaving the hospital.

## 2015-08-03 ENCOUNTER — Ambulatory Visit: Payer: Medicaid Other | Admitting: Family Medicine

## 2015-08-04 DIAGNOSIS — E113511 Type 2 diabetes mellitus with proliferative diabetic retinopathy with macular edema, right eye: Secondary | ICD-10-CM | POA: Diagnosis not present

## 2015-08-04 DIAGNOSIS — H3341 Traction detachment of retina, right eye: Secondary | ICD-10-CM | POA: Diagnosis not present

## 2015-08-04 DIAGNOSIS — H4051X3 Glaucoma secondary to other eye disorders, right eye, severe stage: Secondary | ICD-10-CM | POA: Diagnosis not present

## 2015-08-04 DIAGNOSIS — H35371 Puckering of macula, right eye: Secondary | ICD-10-CM | POA: Diagnosis not present

## 2015-08-09 DIAGNOSIS — I10 Essential (primary) hypertension: Secondary | ICD-10-CM | POA: Diagnosis not present

## 2015-08-09 DIAGNOSIS — N184 Chronic kidney disease, stage 4 (severe): Secondary | ICD-10-CM | POA: Diagnosis not present

## 2015-08-09 DIAGNOSIS — I509 Heart failure, unspecified: Secondary | ICD-10-CM | POA: Diagnosis not present

## 2015-08-09 DIAGNOSIS — D509 Iron deficiency anemia, unspecified: Secondary | ICD-10-CM | POA: Diagnosis not present

## 2015-08-12 ENCOUNTER — Encounter: Payer: Self-pay | Admitting: Family Medicine

## 2015-08-12 ENCOUNTER — Ambulatory Visit (INDEPENDENT_AMBULATORY_CARE_PROVIDER_SITE_OTHER): Payer: Medicaid Other | Admitting: Family Medicine

## 2015-08-12 VITALS — BP 167/84 | HR 67 | Temp 97.3°F | Ht 63.0 in | Wt 196.0 lb

## 2015-08-12 DIAGNOSIS — F39 Unspecified mood [affective] disorder: Secondary | ICD-10-CM | POA: Insufficient documentation

## 2015-08-12 DIAGNOSIS — R0982 Postnasal drip: Secondary | ICD-10-CM

## 2015-08-12 DIAGNOSIS — F32A Depression, unspecified: Secondary | ICD-10-CM

## 2015-08-12 DIAGNOSIS — F329 Major depressive disorder, single episode, unspecified: Secondary | ICD-10-CM

## 2015-08-12 MED ORDER — CITALOPRAM HYDROBROMIDE 20 MG PO TABS: 20.0000 mg | ORAL_TABLET | Freq: Every day | ORAL | Status: DC

## 2015-08-12 MED ORDER — FLUTICASONE PROPIONATE 50 MCG/ACT NA SUSP: 2.0000 | Freq: Every day | NASAL | Status: DC

## 2015-08-12 NOTE — Progress Notes (Signed)
   HPI  Patient presents today  To dscuss depression, CHF, and post- nasal drip.  Depression no suicidal thoughts Worse after she's been out of Celexa for about 2 months.  she requests refill today.   CHF, end-sage renal disease She's had very close follow-up with her nephrologist, she   [place] saw him 2 days ago. They discussed approaching dialysis, however she does not need it yet. She has CHF, she denies any increase in orthopnea(stable 3 pillow), her breathing or swelling. She as normal Diuresis with her Lasix it has get instructions from her nephrologist on how to escalate dosing  she states her swelling is at baseline.   POst nasal drip  Several month Hx of nasal congestion, frequent throat clearing No meds tried   PMH: Smoking status noted ROS: Per HPI  Objective: BP 167/84 mmHg  Pulse 67  Temp(Src) 97.3 F (36.3 C) (Oral)  Ht 5' 3"  (1.6 m)  Wt 196 lb (88.905 kg)  BMI 34.73 kg/m2 Gen: NAD, alert, cooperative with exam HEENT: NCAT, nares with mild swelling CV: RRR, good S1/S2, no murmur Resp: non labored, crackles BL bases Ext:1-2+ pitting edema Neuro: Alert and oriented, No gross deficits  Assessment and plan:  # CHF, CKD Stage 5 Slightly volume overloaded today but sounds like it is her baseline and she is asymptomatic Continue current lasix regimen Has lexiscan ordered at [facility] Has good nephro follow up  # Depression PHQ-9 13 today with no SI Out of celexa x 2 months, restart at 20 mg, caution with near ESRD F/u 3-4 weeks  # Post nasal drip Start flonase No signs of acute illness  Meds ordered this encounter  Medications  . citalopram (CELEXA) 20 MG tablet    Sig: Take 1 tablet (20 mg total) by mouth daily. Reported on 08/12/2015    Dispense:  90 tablet    Refill:  3  . fluticasone (FLONASE) 50 MCG/ACT nasal spray    Sig: Place 2 sprays into both nostrils daily.    Dispense:  16 g    Refill:  [place], MD [facility] 08/12/2015, 9:42 AM

## 2015-08-12 NOTE — Patient Instructions (Signed)
Great to see you!  Re-start celexa, come back in 3-4 weeks  Try flonase 2 sprays per nostril once daily, use it for at least 2 weeks for full effects

## 2015-08-16 ENCOUNTER — Encounter: Payer: Self-pay | Admitting: Vascular Surgery

## 2015-08-19 ENCOUNTER — Ambulatory Visit: Payer: Medicaid Other | Admitting: Pediatrics

## 2015-08-22 ENCOUNTER — Encounter: Payer: Self-pay | Admitting: Family Medicine

## 2015-08-24 ENCOUNTER — Ambulatory Visit (INDEPENDENT_AMBULATORY_CARE_PROVIDER_SITE_OTHER): Payer: Medicare Other | Admitting: Vascular Surgery

## 2015-08-24 ENCOUNTER — Encounter: Payer: Self-pay | Admitting: Vascular Surgery

## 2015-08-24 VITALS — BP 139/71 | HR 65 | Temp 97.2°F | Resp 16 | Ht 63.0 in | Wt 187.0 lb

## 2015-08-24 DIAGNOSIS — N184 Chronic kidney disease, stage 4 (severe): Secondary | ICD-10-CM

## 2015-08-24 NOTE — Progress Notes (Signed)
Postoperative Visit   History of Present Illness  Kara Knapp is a 45 y.o. female who presents for postoperative follow-up from procedure on Date: 9/259/16 BUE venogram.  The patient notes her nephrologist does not think she needs to start HD.  She is unclear when she might need to start.  She denies any complications from the venogram.  She has had some CHF decompensation but this has resolved.  She denies any dysnpea and continues to urinate.  Past Medical History, Past Surgical History, Social History, Family History, Medications, Allergies, and Review of Systems are unchanged from previous evaluation on 04/28/15.  Current Outpatient Prescriptions  Medication Sig Dispense Refill  . aspirin 81 MG tablet Take 81 mg by mouth daily.    . B Complex-C-Folic Acid (B COMPLEX-VITAMIN C-FOLIC ACID) 1 MG tablet Take 1 tablet by mouth daily with breakfast. 30 tablet 6  . blood glucose meter kit and supplies KIT Podigy Voice GLucometer - patient is visually impaired and needs meter that reads results.  Use to check BG up to QID.  Dx: E11.319 Z79.4 1 each 0  . brimonidine-timolol (COMBIGAN) 0.2-0.5 % ophthalmic solution Place 1 drop into the right eye every 12 (twelve) hours.    . calcium acetate (PHOSLO) 667 MG capsule Take 1,334 mg by mouth 3 (three) times daily with meals.    . carvedilol (COREG) 6.25 MG tablet Take 1 tablet (6.25 mg total) by mouth 2 (two) times daily with a meal. Blood pressure medication. 60 tablet 2  . cholecalciferol (VITAMIN D) 1000 units tablet Take 1,000 Units by mouth daily.    . citalopram (CELEXA) 20 MG tablet Take 1 tablet (20 mg total) by mouth daily. Reported on 08/12/2015 (Patient taking differently: Take 30 mg by mouth daily. Reported on 08/12/2015) 90 tablet 3  . fluticasone (FLONASE) 50 MCG/ACT nasal spray Place 2 sprays into both nostrils daily. 16 g 11  . furosemide (LASIX) 40 MG tablet Take 2 tablets (80 mg total) by mouth daily. 30 tablet 4  . gabapentin  (NEURONTIN) 300 MG capsule Take 1 capsule (300 mg total) by mouth 3 (three) times daily. 90 capsule 4  . glucose blood (ACCU-CHEK AVIVA) test strip Test up to qid. Dx E11.319 100 each 5  . HUMALOG KWIKPEN 100 UNIT/ML KiwkPen INJECT 7-10 UNITS INTO THE SKIN THREE TIMES DAILY. 15 mL 2  . hydrALAZINE (APRESOLINE) 25 MG tablet Take 1 tablet (25 mg total) by mouth 3 (three) times daily. 90 tablet 1  . LANTUS SOLOSTAR 100 UNIT/ML Solostar Pen INJECT 15 UNITS AT BEDTIME (Patient taking differently: inject 15 units every morning) 15 mL 0  . simvastatin (ZOCOR) 20 MG tablet TAKE ONE TABLET BY MOUTH DAILY AT 6 PM 34 tablet 5  . furosemide (LASIX) 20 MG tablet     . triamcinolone cream (KENALOG) 0.1 %      No current facility-administered medications for this visit.    On ROS: +mild leg swelling, no SOB   For VQI Use Only  PRE-ADM LIVING: Home  AMB STATUS: Ambulatory  Physical Examination  Filed Vitals:   08/24/15 1009  BP: 139/71  Pulse: 65  Temp: 97.2 F (36.2 C)  TempSrc: Oral  Resp: 16  Height: _0  (1.6 m)  Weight: 187 lb (84.823 kg)  SpO2: 97%   Body mass index is 33.13 kg/(m^2).  General: A&O x 3, WDWN, odor of cigarette  Pulmonary: Sym exp, good air movt, CTAB, no rales, rhonchi, & wheezing  Cardiac:  RRR, Nl S1, S2, no Murmurs, rubs or gallops  Vascular: Vessel Right Left  Radial Palpable Palpable  Ulnar Palpable Palpable  Brachial Palpable Palpable  Carotid Palpable, without bruit Palpable, without bruit  Aorta Not palpable N/A  Femoral Palpable Palpable  Popliteal Not palpable Not palpable  PT Palpable Palpable  DP Palpable Palpable   Gastrointestinal: soft, NTND, -G/R, - HSM, - masses, - CVAT B  Musculoskeletal: M/S 5/5 throughout , Extremities without ischemic changes, mild edema B  Neurologic:  Pain and light touch intact in extremities , Motor exam as listed above   Medical Decision Making  Kara Knapp is a 45 y.o.  female who presents CKD Stage IV, CHF . Based on her venogram, I would recommend: RUE AVG placement (possible Accuseal if needed for early cannulation). The left arm might be usuable with a Gore Hybrid AVG but I would use this as a backup option.   Additionally both arms could be used with a HeRO graft-catheter. I discussed in depth with the patient the nature of atherosclerosis, and emphasized the importance of maximal medical management including strict control of blood pressure, blood glucose, and lipid levels, obtaining regular exercise, and cessation of smoking.  The patient is aware that without maximal medical management the underlying atherosclerotic disease process will progress, limiting the benefit of any interventions.  Thank you for allowing [place] to participate in this patient's care.  [name], MD [facility] Office: [phone] Pager: [phone]

## 2015-09-01 DIAGNOSIS — H35371 Puckering of macula, right eye: Secondary | ICD-10-CM | POA: Diagnosis not present

## 2015-09-01 DIAGNOSIS — E113511 Type 2 diabetes mellitus with proliferative diabetic retinopathy with macular edema, right eye: Secondary | ICD-10-CM | POA: Diagnosis not present

## 2015-09-01 DIAGNOSIS — H211X1 Other vascular disorders of iris and ciliary body, right eye: Secondary | ICD-10-CM | POA: Diagnosis not present

## 2015-09-01 DIAGNOSIS — H4051X3 Glaucoma secondary to other eye disorders, right eye, severe stage: Secondary | ICD-10-CM | POA: Diagnosis not present

## 2015-09-05 ENCOUNTER — Ambulatory Visit (HOSPITAL_COMMUNITY): Admission: RE | Admit: 2015-09-05 | Payer: Medicaid Other | Source: Ambulatory Visit | Admitting: Ophthalmology

## 2015-09-05 DIAGNOSIS — R9439 Abnormal result of other cardiovascular function study: Secondary | ICD-10-CM | POA: Insufficient documentation

## 2015-09-06 ENCOUNTER — Ambulatory Visit (INDEPENDENT_AMBULATORY_CARE_PROVIDER_SITE_OTHER): Payer: Medicaid Other | Admitting: Family Medicine

## 2015-09-06 ENCOUNTER — Encounter: Payer: Self-pay | Admitting: Family Medicine

## 2015-09-06 VITALS — BP 169/84 | HR 66 | Temp 97.0°F | Ht 63.0 in | Wt 187.0 lb

## 2015-09-06 DIAGNOSIS — E118 Type 2 diabetes mellitus with unspecified complications: Secondary | ICD-10-CM

## 2015-09-06 DIAGNOSIS — N3000 Acute cystitis without hematuria: Secondary | ICD-10-CM

## 2015-09-06 DIAGNOSIS — R109 Unspecified abdominal pain: Secondary | ICD-10-CM

## 2015-09-06 LAB — POCT UA - MICROSCOPIC ONLY
Casts, Ur, LPF, POC: NEGATIVE
Crystals, Ur, HPF, POC: NEGATIVE
RBC, URINE, MICROSCOPIC: NEGATIVE
Yeast, UA: NEGATIVE

## 2015-09-06 LAB — POCT URINALYSIS DIPSTICK
Bilirubin, UA: NEGATIVE
GLUCOSE UA: NEGATIVE
Ketones, UA: 1.01
NITRITE UA: NEGATIVE
PH UA: 7.5
RBC UA: NEGATIVE
Spec Grav, UA: 1.01
UROBILINOGEN UA: NEGATIVE

## 2015-09-06 LAB — POCT GLYCOSYLATED HEMOGLOBIN (HGB A1C): HEMOGLOBIN A1C: 7.4

## 2015-09-06 MED ORDER — CEPHALEXIN 250 MG PO CAPS: 250.0000 mg | ORAL_CAPSULE | Freq: Three times a day (TID) | ORAL | Status: DC

## 2015-09-06 NOTE — Progress Notes (Signed)
   HPI  Patient presents today today for routine follow-up.  She is currently working on disability, we had plenty of dbout this. Her main issue is visual impairment, also she has severe fatigue with any activities.  Diabetes Her diet has not been as good lately, she doesn't have as much help at home but she did previously. Her average fasting blood sugar is 150-175 She does not know her postprane takes 15 units of Lantus daily, 7-10 units of Humalog at each meal, 2-3 times daily. No hypoglycemia.  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 She has severe diabetic retinopathy.  Also complains of lower abdominal discomfort over the last 4 days. She denies any dysuria, fever, chills, or flank pain She has been more sexually active lately and wonders if its just pain due to her fibroids after sex. The pain waxes and wanes and does not have a lot of association with sex.  PMH: Smoking status noted ROS: Per HPI  Objective: BP 169/84 mmHg  Pulse 66  Temp(Src) 97 F (36.1 C) (Oral)  Ht 5' 3"  (1.6 m)  Wt 187 lb (84.823 kg)  BMI 33.13 kg/m2 Gen: NAD, alert, cooperative with exam HEENT: NCAT, slightly discordant gaze of the eyes CV: RRR, good S1/S2, no murmur Resp: CTABL, no wheezes, non-labored Abd: mild suprapubic tenderness, no CVA tenderness Neuro: Alert and oriented, No gross deficits  Assessment and plan:  # urinary tract infection Treat with renally dosed Keflex. Urine culture ordered  # diabetic retinopathy Significant limitations due to this, she has a ophthalmologist that she is following up with.  # diabetes Control slipping, her A1c is changed from 6.5-7.4 She feels it's mostly diet changes. Continue current management with 15 units of Lantus nightly, 7-10 units of Humalog at each meal.  # diabetic neuropathy Helped by gabapentin Continue      Orders Placed This Encounter  Procedures  .  Urine culture  . Microalbumin / creatinine urine ratio  . POCT glycosylated hemoglobin (Hb A1C)  . POCT UA - Microscopic Only  . POCT urinalysis dipstick    [name], MD [facility] 09/06/2015, 11:37 AM

## 2015-09-06 NOTE — Patient Instructions (Addendum)
Great to see you!  I have sent Keflex to your pharmacy for a UTI  If you develop vaginal itching or irritation please let us know   Urinary Tract Infection Urinary tract infections (UTIs) can develop anywhere along your urinary tract. Your urinary tract is your body's drainage system for removing wastes and extra water. Your urinary tract includes two kidneys, two ureters, a bladder, and a urethra. Your kidneys are a pair of bean-shaped organs. Each kidney is about the size of your fist. They are located below your ribs, one on each side of your spine. CAUSES Infections are caused by microbes, which are microscopic organisms, including fungi, viruses, and bacteria. These organisms are so small that they can only be seen through a microscope. Bacteria are the microbes that most commonly cause UTIs. SYMPTOMS  Symptoms of UTIs may vary by age and gender of the patient and by the location of the infection. Symptoms in young women typically include a frequent and intense urge to urinate and a painful, burning feeling in the bladder or urethra during urination. Older women and men are more likely to be tired, shaky, and weak and have muscle aches and abdominal pain. A fever may mean the infection is in your kidneys. Other symptoms of a kidney infection include pain in your back or sides below the ribs, nausea, and vomiting. DIAGNOSIS To diagnose a UTI, your caregiver will ask you about your symptoms. Your caregiver will also ask you to provide a urine sample. The urine sample will be tested for bacteria and white blood cells. White blood cells are made by your body to help fight infection. TREATMENT  Typically, UTIs can be treated with medication. Because most UTIs are caused by a bacterial infection, they usually can be treated with the use of antibiotics. The choice of antibiotic and length of treatment depend on your symptoms and the type of bacteria causing your infection. HOME CARE INSTRUCTIONS  If  you were prescribed antibiotics, take them exactly as your caregiver instructs you. Finish the medication even if you feel better after you have only taken some of the medication.  Drink enough water and fluids to keep your urine clear or pale yellow.  Avoid caffeine, tea, and carbonated beverages. They tend to irritate your bladder.  Empty your bladder often. Avoid holding urine for long periods of time.  Empty your bladder before and after sexual intercourse.  After a bowel movement, women should cleanse from front to back. Use each tissue only once. SEEK MEDICAL CARE IF:   You have back pain.  You develop a fever.  Your symptoms do not begin to resolve within 3 days. SEEK IMMEDIATE MEDICAL CARE IF:   You have severe back pain or lower abdominal pain.  You develop chills.  You have nausea or vomiting.  You have continued burning or discomfort with urination. MAKE SURE YOU:   Understand these instructions.  Will watch your condition.  Will get help right away if you are not doing well or get worse.   This information is not intended to replace advice given to you by your health care provider. Make sure you discuss any questions you have with your health care provider.   Document Released: 04/25/2005 Document Revised: 04/06/2015 Document Reviewed: 08/24/2011 Elsevier Interactive Patient Education [employer].

## 2015-09-07 LAB — URINE CULTURE

## 2015-09-19 DIAGNOSIS — E113511 Type 2 diabetes mellitus with proliferative diabetic retinopathy with macular edema, right eye: Secondary | ICD-10-CM | POA: Diagnosis not present

## 2015-09-19 DIAGNOSIS — H211X2 Other vascular disorders of iris and ciliary body, left eye: Secondary | ICD-10-CM | POA: Diagnosis not present

## 2015-09-19 DIAGNOSIS — E113592 Type 2 diabetes mellitus with proliferative diabetic retinopathy without macular edema, left eye: Secondary | ICD-10-CM | POA: Diagnosis not present

## 2015-09-19 DIAGNOSIS — H4051X3 Glaucoma secondary to other eye disorders, right eye, severe stage: Secondary | ICD-10-CM | POA: Diagnosis not present

## 2015-09-22 ENCOUNTER — Ambulatory Visit (INDEPENDENT_AMBULATORY_CARE_PROVIDER_SITE_OTHER): Payer: Medicaid Other | Admitting: Family Medicine

## 2015-09-22 ENCOUNTER — Encounter: Payer: Self-pay | Admitting: Family Medicine

## 2015-09-22 VITALS — BP 169/85 | HR 66 | Temp 97.2°F | Ht 63.0 in | Wt 189.2 lb

## 2015-09-22 DIAGNOSIS — IMO0001 Reserved for inherently not codable concepts without codable children: Secondary | ICD-10-CM

## 2015-09-22 DIAGNOSIS — R195 Other fecal abnormalities: Secondary | ICD-10-CM | POA: Diagnosis not present

## 2015-09-22 DIAGNOSIS — Z794 Long term (current) use of insulin: Secondary | ICD-10-CM

## 2015-09-22 DIAGNOSIS — I10 Essential (primary) hypertension: Secondary | ICD-10-CM | POA: Diagnosis not present

## 2015-09-22 DIAGNOSIS — E11319 Type 2 diabetes mellitus with unspecified diabetic retinopathy without macular edema: Secondary | ICD-10-CM

## 2015-09-22 NOTE — Progress Notes (Signed)
   HPI  Patient presents today you today to discuss loose stools.  Patient explains that over the last 2 years, after her gallbladder removal in 2014, (That would be 3 years) she has been alternating between not going to the bathroom and then having loose stools, she describes 3-4 days between stools on many occasions.  She does not have abdominal pain or feel constipated.  Toenails She requests referral for podiatrist to helpmaintain her toenails. She has difficult time trimming them and they often fall off. She has no numbness or tingling in her feet.  HTN Good med compliance She has just finished 1 week of close monitoring with nephrology   PMH: Smoking status noted ROS: Per HPI  Objective: BP 169/85 mmHg  Pulse 66  Temp(Src) 97.2 F (36.2 C) (Oral)  Ht 5' 3"  (1.6 m)  Wt 189 lb 3.2 oz (85.821 kg)  BMI 33.52 kg/m2 Gen: NAD, alert, cooperative with exam HEENT: NCAT CV: RRR, good S1/S2, no murmur Resp: CTABL, no wheezes, non-labored Abd: oft, mild tenderness to palpation throughout, right upper quadrant greater than the rest of her abdomen Neuro: Alert and oriented  Diabetic foot exam 2+ dorsalis pedis pulses, no lesions,sensation intact to monofilament throughout Thickened yellow toenailsthroughout  Assessment and plan:  # loose stools, likely IBS Start daily Metamucil, sugar-free Also add in yogurt with live cultures Follow-up one month to discuss  progress  # hypertension elevated She has just finished a one-weekmonitoring from her nephrologist She has good medication compliance Appreciate their careful eval, defer changes to them as they just checked home BPs  # Diabetes with retinopathy, thickened toenails Insulin dependent DM2, has thickened toenails Refer to podiatry Foot exam today.   [name], MD [facility] 09/22/2015, 4:20 PM

## 2015-09-22 NOTE — Patient Instructions (Addendum)
Great to see you!  Try 1 scoop of sugar free metamucil and 1 activia yogurt daily  Come back in 1 month  Irritable Bowel Syndrome, Adult Irritable bowel syndrome (IBS) is not one specific disease. It is a group of symptoms that affects the organs responsible for digestion (gastrointestinal or GI tract).  To regulate how your GI tract works, your body sends signals back and forth between your intestines and your brain. If you have IBS, there may be a problem with these signals. As a result, your GI tract does not function normally. Your intestines may become more sensitive and overreact to certain things. This is especially true when you eat certain foods or when you are under stress.  There are four types of IBS. These may be determined based on the consistency of your stool:   IBS with diarrhea.   IBS with constipation.   Mixed IBS.   Unsubtyped IBS.  It is important to know which type of IBS you have. Some treatments are more likely to be helpful for certain types of IBS.  CAUSES  The exact cause of IBS is not known. RISK FACTORS You may have a higher risk of IBS if:  You are a woman.  You are younger than 46 years old.  You have a family history of IBS.  You have mental health problems.  You have had bacterial infection of your GI tract. SIGNS AND SYMPTOMS  Symptoms of IBS vary from person to person. The main symptom is abdominal pain or discomfort. Additional symptoms usually include one or more of the following:   Diarrhea, constipation, or both.   Abdominal swelling or bloating.   Feeling full or sick after eating a small or regular-size meal.   Frequent gas.   Mucus in the stool.   A feeling of having more stool left after a bowel movement.  Symptoms tend to come and go. They may be associated with stress, psychiatric conditions, or nothing at all.  DIAGNOSIS  There is no specific test to diagnose IBS. Your health care provider will make a diagnosis  based on a physical exam, medical history, and your symptoms. You may have other tests to rule out other conditions that may be causing your symptoms. These may include:   Blood tests.   X-rays.   CT scan.  Endoscopy and colonoscopy. This is a test in which your GI tract is viewed with a long, thin, flexible tube. TREATMENT There is no cure for IBS, but treatment can help relieve symptoms. IBS treatment often includes:   Changes to your diet, such as:  Eating more fiber.  Avoiding foods that cause symptoms.  Drinking more water.  Eating regular, medium-sized portioned meals.  Medicines. These may include:  Fiber supplements if you have constipation.  Medicine to control diarrhea (antidiarrheal medicines).  Medicine to help control muscle spasms in your GI tract (antispasmodic medicines).  Medicines to help with any mental health issues, such as antidepressants or tranquilizers.  Therapy.  Talk therapy may help with anxiety, depression, or other mental health issues that can make IBS symptoms worse.  Stress reduction.  Managing your stress can help keep symptoms under control. HOME CARE INSTRUCTIONS   Take medicines only as directed by your health care provider.  Eat a healthy diet.  Avoid foods and drinks with added sugar.  Include more whole grains, fruits, and vegetables gradually into your diet. This may be especially helpful if you have IBS with constipation.  Avoid any  foods and drinks that make your symptoms worse. These may include dairy products and caffeinated or carbonated drinks.  Do not eat large meals.  Drink enough fluid to keep your urine clear or pale yellow.  Exercise regularly. Ask your health care provider for recommendations of good activities for you.  Keep all follow-up visits as directed by your health care provider. This is important. SEEK MEDICAL CARE IF:   You have constant pain.  You have trouble or pain with  swallowing.  You have worsening diarrhea. SEEK IMMEDIATE MEDICAL CARE IF:   You have severe and worsening abdominal pain.   You have diarrhea and:   You have a rash, stiff neck, or severe headache.   You are irritable, sleepy, or difficult to awaken.   You are weak, dizzy, or extremely thirsty.   You have bright red blood in your stool or you have black tarry stools.   You have unusual abdominal swelling that is painful.   You vomit continuously.   You vomit blood (hematemesis).   You have both abdominal pain and a fever.    This information is not intended to replace advice given to you by your health care provider. Make sure you discuss any questions you have with your health care provider.   Document Released: 07/16/2005 Document Revised: 08/06/2014 Document Reviewed: 04/02/2014 Elsevier Interactive Patient Education [employer].

## 2015-10-07 ENCOUNTER — Encounter: Payer: Self-pay | Admitting: Vascular Surgery

## 2015-10-07 ENCOUNTER — Ambulatory Visit (INDEPENDENT_AMBULATORY_CARE_PROVIDER_SITE_OTHER): Payer: Medicare Other | Admitting: Vascular Surgery

## 2015-10-07 ENCOUNTER — Other Ambulatory Visit: Payer: Self-pay

## 2015-10-07 VITALS — BP 152/74 | HR 66 | Temp 97.2°F | Resp 18 | Ht 63.0 in | Wt 195.5 lb

## 2015-10-07 DIAGNOSIS — N185 Chronic kidney disease, stage 5: Secondary | ICD-10-CM

## 2015-10-07 NOTE — Progress Notes (Signed)
History of Present Illness  Kara Knapp is a 45 y.o. (1970/10/22) female known CKD stage IV who presents for permanent access placement.  She has progressed into CKD Stage V and now is ready to proceed with access placement.She denies any sx form uremia.  She thinks her nephrologist wants to start HD as soon as possible.  Past Medical History  Diagnosis Date  . Insulin-dependent diabetes mellitus with retinopathy ([place])   . Asthma   . Hypertension   . Osteomyelitis ([place])     left middle toe   . Cataract   . Bronchitis   . Pneumonia   . Depression   . Neuropathy due to secondary diabetes mellitus ([place])   . Hyperlipidemia   . Fibroids   . Anemia of chronic disease   . Chronic kidney disease     Stage III-IV  . CHF (congestive heart failure) ([facility])     Presumed diastolic  . Cholecystitis, acute 05/26/2013    Status post cholecystectomy  . Tobacco abuse   . Diabetic foot ulcer ([place]) 03/01/2015    Past Surgical History  Procedure Laterality Date  . Cesarean section    . Tubal ligation    . Cholecystectomy N/A 05/25/2013    Procedure: LAPAROSCOPIC CHOLECYSTECTOMY;  Surgeon: Jamesetta So, MD;  Location: [facility];  Service: General;  Laterality: N/A;  . Eye surgery    . Pars plana vitrectomy Left 11/24/2014    Procedure: PARS PLANA VITRECTOMY WITH 25 GAUGE;  Surgeon: Hurman Horn, MD;  Location: [facility];  Service: Ophthalmology;  Laterality: Left;  . Photocoagulation with laser Left 11/24/2014    Procedure: PHOTOCOAGULATION WITH LASER;  Surgeon: Hurman Horn, MD;  Location: [facility];  Service: Ophthalmology;  Laterality: Left;  with insertion of silicone oil  . Peripheral vascular catheterization N/A 04/28/2015    Procedure: Bilateral Upper Extremity Venography;  Surgeon: Conrad Bonanza, MD;  Location: [facility];  Service: Cardiovascular;  Laterality: N/A;    Social History   Social History  . Marital Status: Legally Separated    Spouse Name: N/A  . Number of Children:  N/A  . Years of Education: N/A   Occupational History  . Not on file.   Social History Main Topics  . Smoking status: Current Every Day Smoker -- 1.00 packs/day for 20 years    Types: Cigarettes  . Smokeless tobacco: Never Used  . Alcohol Use: No  . Drug Use: No  . Sexual Activity: Yes    Birth Control/ Protection: Surgical   Other Topics Concern  . Not on file   Social History Narrative    Family History  Problem Relation Age of Onset  . COPD Mother   . Cancer Father   . Diabetes Sister   . Deep vein thrombosis Sister   . Diabetes Brother   . Hyperlipidemia Brother   . Hypertension Brother   . Mental retardation Sister     Current Outpatient Prescriptions  Medication Sig Dispense Refill  . aspirin 81 MG tablet Take 81 mg by mouth daily.    . B Complex-C-Folic Acid (B COMPLEX-VITAMIN C-FOLIC ACID) 1 MG tablet Take 1 tablet by mouth daily with breakfast. 30 tablet 6  . blood glucose meter kit and supplies KIT Podigy Voice GLucometer - patient is visually impaired and needs meter that reads results.  Use to check BG up to QID.  Dx: E11.319 Z79.4 1 each 0  . brimonidine-timolol (COMBIGAN) 0.2-0.5 % ophthalmic  solution Place 1 drop into the right eye every 12 (twelve) hours.    . calcium acetate (PHOSLO) 667 MG capsule Take 1,334 mg by mouth 3 (three) times daily with meals.    . carvedilol (COREG) 6.25 MG tablet Take 1 tablet (6.25 mg total) by mouth 2 (two) times daily with a meal. Blood pressure medication. 60 tablet 2  . cholecalciferol (VITAMIN D) 1000 units tablet Take 1,000 Units by mouth daily.    . citalopram (CELEXA) 20 MG tablet Take 1 tablet (20 mg total) by mouth daily. Reported on 08/12/2015 (Patient taking differently: Take 30 mg by mouth daily. Reported on 08/12/2015) 90 tablet 3  . fluticasone (FLONASE) 50 MCG/ACT nasal spray Place 2 sprays into both nostrils daily. 16 g 11  . furosemide (LASIX) 40 MG tablet Take 2 tablets (80 mg total) by mouth daily. 30 tablet  4  . gabapentin (NEURONTIN) 300 MG capsule Take 1 capsule (300 mg total) by mouth 3 (three) times daily. 90 capsule 4  . glucose blood (ACCU-CHEK AVIVA) test strip Test up to qid. Dx E11.319 100 each 5  . HUMALOG KWIKPEN 100 UNIT/ML KiwkPen INJECT 7-10 UNITS INTO THE SKIN THREE TIMES DAILY. 15 mL 2  . hydrALAZINE (APRESOLINE) 25 MG tablet Take 1 tablet (25 mg total) by mouth 3 (three) times daily. 90 tablet 1  . LANTUS SOLOSTAR 100 UNIT/ML Solostar Pen INJECT 15 UNITS AT BEDTIME (Patient taking differently: inject 15 units every morning) 15 mL 0  . simvastatin (ZOCOR) 20 MG tablet TAKE ONE TABLET BY MOUTH DAILY AT 6 PM 34 tablet 5  . triamcinolone cream (KENALOG) 0.1 %      No current facility-administered medications for this visit.     Allergies  Allergen Reactions  . Bactrim [Sulfamethoxazole-Trimethoprim] Nausea And Vomiting  . Prednisone Other (See Comments)    "I was wide open and couldn't eat" per pt.      REVIEW OF SYSTEMS:  (Positives checked otherwise negative)  CARDIOVASCULAR:   _0  chest pain,  _1  chest pressure,  _2  palpitations,  _3  shortness of breath when laying flat,  _4  shortness of breath with exertion,   _5  pain in feet when walking,  _6  pain in feet when laying flat, _7  history of blood clot in veins (DVT),  _8  history of phlebitis,  _9  swelling in legs,  _10  varicose veins  PULMONARY:   _11  productive cough,  _12  asthma,  _13  wheezing  NEUROLOGIC:   _14  weakness in arms or legs,  _15  numbness in arms or legs,  _16  difficulty speaking or slurred speech,  _17  temporary loss of vision in one eye,  _18  dizziness  HEMATOLOGIC:   _19  bleeding problems,  _20  problems with blood clotting too easily  MUSCULOSKEL:   _21  joint pain, _22  joint swelling  GASTROINTEST:   _23  vomiting blood,  _24  blood in stool     GENITOURINARY:   _25  burning with urination,  _26  blood in urine  PSYCHIATRIC:   _27  history of major  depression  INTEGUMENTARY:   _28  rashes,  _29  ulcers  CONSTITUTIONAL:   _30  fever,  _31  chills   For VQI Use Only  PRE-ADM LIVING: Home  AMB STATUS: Ambulatory   Physical Examination Filed Vitals:   10/07/15 0918 10/07/15 0922  BP: 158/78 152/74  Pulse: 66  Temp: 97.2 F (36.2 C)   TempSrc: Oral   Resp: 18   Height: _0  (1.6 m)   Weight: 195 lb 8 oz (88.678 kg)   SpO2: 96%    Body mass index is 34.64 kg/(m^2).  General: A&O x 3, WDWN,  Pulmonary: Sym exp, good air movt, CTAB, no rales, rhonchi, & wheezing  Cardiac: RRR, Nl S1, S2, no Murmurs, rubs or gallops  Vascular: Vessel Right Left  Radial Palpable Palpable  Ulnar Palpable Palpable  Brachial Palpable Palpable  Carotid Palpable, without bruit Palpable, without bruit  Aorta Not palpable N/A  Femoral Palpable Palpable  Popliteal Not palpable Not palpable  PT Palpable Palpable  DP Palpable Palpable   Gastrointestinal: soft, NTND, -G/R, - HSM, - masses, - CVAT B  Musculoskeletal: M/S 5/5 throughout , Extremities without ischemic changes  Neurologic: Pain and light touch intact in extremities , Motor exam as listed above   Medical Decision Making  Kara Knapp is a 45 y.o. (February 28, 1971) female  who presents CKD Stage V with imminent ESRD .  Based on her venogram, I would recommend: RUE AVG placement with Accuseal   Risk, benefits, and alternatives to access surgery were discussed.    The patient is aware the risks include but are not limited to: bleeding, infection, steal syndrome, nerve damage, ischemic monomelic neuropathy, failure to mature, need for additional procedures, death and stroke.    The patient agrees to proceed forward with the procedure.  She is scheduled for 10/17/15  Thank you for allowing [place] to participate in this patient's care.  [name], MD [facility] Office:  [phone] Pager: [phone]

## 2015-10-07 NOTE — Progress Notes (Signed)
Filed Vitals:   10/07/15 0918 10/07/15 0922  BP: 158/78 152/74  Pulse: 66   Temp: 97.2 F (36.2 C)   TempSrc: Oral   Resp: 18   Height: 5' 3"  (1.6 m)   Weight: 195 lb 8 oz (88.678 kg)   SpO2: 96%

## 2015-10-12 ENCOUNTER — Encounter: Payer: Self-pay | Admitting: Nephrology

## 2015-10-14 ENCOUNTER — Encounter (HOSPITAL_COMMUNITY): Payer: Self-pay | Admitting: *Deleted

## 2015-10-14 MED ORDER — PHENYLEPHRINE HCL 2.5 % OP SOLN: 1.0000 [drp] | OPHTHALMIC | Status: DC | PRN

## 2015-10-14 MED ORDER — GATIFLOXACIN 0.5 % OP SOLN: 1.0000 [drp] | OPHTHALMIC | Status: DC | PRN

## 2015-10-14 MED ORDER — CYCLOPENTOLATE HCL 1 % OP SOLN: 1.0000 [drp] | OPHTHALMIC | Status: DC | PRN

## 2015-10-14 NOTE — Progress Notes (Signed)
Anesthesia Chart Review: SAME DAY WORK-UP.  Patient is a 45 year old female scheduled for insertion of RUE AVGG with Accuseal (if needed for early cannulation) on 10/17/15 by Dr. Bridgett Larsson.  History includes CKD stage V (not yet on HD), smoking, hypertension, DM2 (insulin-dependent), diabetic foot ulcer with osteomyelitis left 3rd toe, diastolic CHF, anemia chronic disease (also with 11/2014 admission from [facility] anemia possible r/t fibroids s/p transfusion for HBG 6.5), hyperlipidemia, depression, asthma, cholecystectomy '14, left eye vitrectomy '16. PCP is Dr. Warrick Parisian with [facility]. Nephrologist is Dr. Lowanda Foster.  Cardiologist is Dr. Almira Coaster with [facility] (Care Everywhere). He saw her on 07/13/15 following a 05/2015 admission for a reaction to bug spray and PNA. "Small troponin leak likely type 2." Recent echo was normal. She was having recurrent SOB within the previous month with associated symptoms of chest pain. An ETT but had to stop < 1 minute due to leg fatigue. Nuclear stress test was ordered (see below). According to his 09/05/15 note, "Small defect, normal LV function, CKD, functional status limited by other than cardiac. Medical therapy for now. Coronary angio for low risk test may accelerate dialysis and its inherent risk and problems."  08/18/15 Nuclear stress test [facility]): Conclusion: Abnormal study. Small stress induced anterior septal defect. Normal LV function. Global left ventricular systolic function, with EF of 64%.  08/11/15 ETT ([facility]): Incomplete. Patient lying over handrail prior to 1 minute mark, c/o leg fatigue. No chest pain. Lexiscan ordered.   12/09/14 Echo [facility]): Findings: Left ventricular chamber size is normal. Mild to moderate concentric LVH. Global left ventricular wall motion and contractility are within normal limits. Estimated EF is greater than 65%. EF 66.9% when calculated by  Teichholz method. Normal LV diastolic filling is observed. Aortic valve is trileaflet. Trace aortic regurgitation. Trace mitral regurgitation. Mild tricuspid regurgitation. Trace pulmonic regurgitation.  07/27/15 EKG: SR. Isolated negative T wave in lateral lead aVL with low r wave. EKG stable when compared to 07/09/14 tracing (Media tab).   Meds include ASA 47m, Combigan ophthalmic, Phoslo, Coreg, Celexa, Flonase, Lasix, Neurontin, Humalog, hydralazine, Lantus, Zocor.   She is for labs on arrival.   Staff has not been able to speak with patient yet, but available records reviewed with anesthesiologist Dr. GLissa Hoard Patient with cardiology follow-up last month. Medical therapy recommended. If labs acceptable and no acute CV/CHF issues the day of surgery then it is anticipated that she can proceed as planned.  AGeorge HughMChristus [facility] Center[facility] Stay [facility]/Anesthesiology Phone ([phone]/17/2017 2:16 PM

## 2015-10-16 MED ORDER — DEXTROSE 5 % IV SOLN
1.5000 g | INTRAVENOUS | Status: AC
  Administered 2015-10-17: 1.5 g via INTRAVENOUS
  Filled 2015-10-16: qty 1.5

## 2015-10-17 ENCOUNTER — Encounter (HOSPITAL_COMMUNITY)
Admission: RE | Disposition: A | Discharge status: Home / Self Care | Payer: Self-pay | Source: Ambulatory Visit | Attending: Vascular Surgery

## 2015-10-17 ENCOUNTER — Ambulatory Visit (HOSPITAL_COMMUNITY)
Admission: RE | Admit: 2015-10-17 | Discharge: 2015-10-17 | Disposition: A | Discharge status: Home / Self Care | Payer: Medicaid Other | Source: Ambulatory Visit | Attending: Vascular Surgery | Admitting: Vascular Surgery

## 2015-10-17 ENCOUNTER — Ambulatory Visit (HOSPITAL_COMMUNITY): Payer: Medicaid Other | Admitting: Vascular Surgery

## 2015-10-17 ENCOUNTER — Encounter (HOSPITAL_COMMUNITY): Payer: Self-pay | Admitting: Certified Registered"

## 2015-10-17 DIAGNOSIS — Z794 Long term (current) use of insulin: Secondary | ICD-10-CM | POA: Diagnosis not present

## 2015-10-17 DIAGNOSIS — E1040 Type 1 diabetes mellitus with diabetic neuropathy, unspecified: Secondary | ICD-10-CM | POA: Diagnosis not present

## 2015-10-17 DIAGNOSIS — D638 Anemia in other chronic diseases classified elsewhere: Secondary | ICD-10-CM | POA: Diagnosis not present

## 2015-10-17 DIAGNOSIS — I509 Heart failure, unspecified: Secondary | ICD-10-CM | POA: Diagnosis not present

## 2015-10-17 DIAGNOSIS — I132 Hypertensive heart and chronic kidney disease with heart failure and with stage 5 chronic kidney disease, or end stage renal disease: Secondary | ICD-10-CM | POA: Insufficient documentation

## 2015-10-17 DIAGNOSIS — F329 Major depressive disorder, single episode, unspecified: Secondary | ICD-10-CM | POA: Insufficient documentation

## 2015-10-17 DIAGNOSIS — Z7982 Long term (current) use of aspirin: Secondary | ICD-10-CM | POA: Insufficient documentation

## 2015-10-17 DIAGNOSIS — N185 Chronic kidney disease, stage 5: Secondary | ICD-10-CM | POA: Insufficient documentation

## 2015-10-17 DIAGNOSIS — E1022 Type 1 diabetes mellitus with diabetic chronic kidney disease: Secondary | ICD-10-CM | POA: Insufficient documentation

## 2015-10-17 DIAGNOSIS — E785 Hyperlipidemia, unspecified: Secondary | ICD-10-CM | POA: Insufficient documentation

## 2015-10-17 DIAGNOSIS — F17210 Nicotine dependence, cigarettes, uncomplicated: Secondary | ICD-10-CM | POA: Diagnosis not present

## 2015-10-17 DIAGNOSIS — Z7951 Long term (current) use of inhaled steroids: Secondary | ICD-10-CM | POA: Insufficient documentation

## 2015-10-17 DIAGNOSIS — Z79899 Other long term (current) drug therapy: Secondary | ICD-10-CM | POA: Diagnosis not present

## 2015-10-17 DIAGNOSIS — J45909 Unspecified asthma, uncomplicated: Secondary | ICD-10-CM | POA: Diagnosis not present

## 2015-10-17 DIAGNOSIS — N184 Chronic kidney disease, stage 4 (severe): Secondary | ICD-10-CM | POA: Diagnosis not present

## 2015-10-17 DIAGNOSIS — E10319 Type 1 diabetes mellitus with unspecified diabetic retinopathy without macular edema: Secondary | ICD-10-CM | POA: Insufficient documentation

## 2015-10-17 HISTORY — PX: AV FISTULA PLACEMENT: SHX1204

## 2015-10-17 HISTORY — DX: Unspecified glaucoma: H40.9

## 2015-10-17 HISTORY — DX: Personal history of other medical treatment: Z92.89

## 2015-10-17 HISTORY — DX: Blindness, one eye, unspecified eye: H54.40

## 2015-10-17 LAB — POCT I-STAT 4, (NA,K, GLUC, HGB,HCT)
Glucose, Bld: 57 mg/dL — ABNORMAL LOW (ref 65–99)
HEMATOCRIT: 26 % — AB (ref 36.0–46.0)
HEMOGLOBIN: 8.8 g/dL — AB (ref 12.0–15.0)
POTASSIUM: 4.8 mmol/L (ref 3.5–5.1)
SODIUM: 140 mmol/L (ref 135–145)

## 2015-10-17 LAB — HCG, SERUM, QUALITATIVE: Preg, Serum: NEGATIVE

## 2015-10-17 LAB — GLUCOSE, CAPILLARY: Glucose-Capillary: 129 mg/dL — ABNORMAL HIGH (ref 65–99)

## 2015-10-17 SURGERY — INSERTION OF ARTERIOVENOUS (AV) GORE-TEX GRAFT ARM
Anesthesia: General | Site: Arm Upper | Laterality: Right

## 2015-10-17 MED ORDER — PROMETHAZINE HCL 25 MG/ML IJ SOLN
INTRAMUSCULAR | Status: AC
  Filled 2015-10-17: qty 1

## 2015-10-17 MED ORDER — FENTANYL CITRATE (PF) 100 MCG/2ML IJ SOLN
INTRAMUSCULAR | Status: DC | PRN
  Administered 2015-10-17 (×3): 25 ug via INTRAVENOUS

## 2015-10-17 MED ORDER — PROMETHAZINE HCL 25 MG/ML IJ SOLN
6.2500 mg | INTRAMUSCULAR | Status: DC | PRN
  Administered 2015-10-17: 6.25 mg via INTRAVENOUS

## 2015-10-17 MED ORDER — OXYCODONE HCL 5 MG PO TABS: 5.0000 mg | ORAL_TABLET | Freq: Four times a day (QID) | ORAL | Status: DC | PRN

## 2015-10-17 MED ORDER — MIDAZOLAM HCL 2 MG/2ML IJ SOLN
INTRAMUSCULAR | Status: AC
  Filled 2015-10-17: qty 2

## 2015-10-17 MED ORDER — LIDOCAINE HCL (CARDIAC) 20 MG/ML IV SOLN
INTRAVENOUS | Status: AC
  Filled 2015-10-17: qty 5

## 2015-10-17 MED ORDER — PROPOFOL 10 MG/ML IV BOLUS
INTRAVENOUS | Status: DC | PRN
  Administered 2015-10-17: 180 mg via INTRAVENOUS

## 2015-10-17 MED ORDER — 0.9 % SODIUM CHLORIDE (POUR BTL) OPTIME
TOPICAL | Status: DC | PRN
  Administered 2015-10-17: 1000 mL

## 2015-10-17 MED ORDER — ONDANSETRON HCL 4 MG/2ML IJ SOLN
INTRAMUSCULAR | Status: DC | PRN
  Administered 2015-10-17: 4 mg via INTRAVENOUS

## 2015-10-17 MED ORDER — HYDROMORPHONE HCL 1 MG/ML IJ SOLN: 0.2500 mg | INTRAMUSCULAR | Status: DC | PRN

## 2015-10-17 MED ORDER — EPHEDRINE SULFATE 50 MG/ML IJ SOLN
INTRAMUSCULAR | Status: AC
  Filled 2015-10-17: qty 2

## 2015-10-17 MED ORDER — DEXTROSE 50 % IV SOLN
INTRAVENOUS | Status: AC
  Administered 2015-10-17: 25 mL
  Filled 2015-10-17: qty 50

## 2015-10-17 MED ORDER — HEPARIN SODIUM (PORCINE) 1000 UNIT/ML IJ SOLN
INTRAMUSCULAR | Status: DC | PRN
  Administered 2015-10-17: 5000 [IU] via INTRAVENOUS

## 2015-10-17 MED ORDER — CHLORHEXIDINE GLUCONATE CLOTH 2 % EX PADS: 6.0000 | MEDICATED_PAD | Freq: Once | CUTANEOUS | Status: DC

## 2015-10-17 MED ORDER — PHENYLEPHRINE HCL 10 MG/ML IJ SOLN
INTRAMUSCULAR | Status: DC | PRN
  Administered 2015-10-17 (×3): 80 ug via INTRAVENOUS

## 2015-10-17 MED ORDER — HEPARIN SODIUM (PORCINE) 1000 UNIT/ML IJ SOLN
INTRAMUSCULAR | Status: AC
  Filled 2015-10-17: qty 1

## 2015-10-17 MED ORDER — PROTAMINE SULFATE 10 MG/ML IV SOLN
INTRAVENOUS | Status: AC
  Filled 2015-10-17: qty 5

## 2015-10-17 MED ORDER — PHENYLEPHRINE HCL 10 MG/ML IJ SOLN
20.0000 mg | INTRAMUSCULAR | Status: DC | PRN
  Administered 2015-10-17: 20 ug/min via INTRAVENOUS

## 2015-10-17 MED ORDER — PROPOFOL 10 MG/ML IV BOLUS
INTRAVENOUS | Status: AC
  Filled 2015-10-17: qty 20

## 2015-10-17 MED ORDER — LIDOCAINE HCL (PF) 1 % IJ SOLN
INTRAMUSCULAR | Status: AC
  Filled 2015-10-17: qty 30

## 2015-10-17 MED ORDER — HEPARIN SODIUM (PORCINE) 5000 UNIT/ML IJ SOLN
INTRAMUSCULAR | Status: DC | PRN
  Administered 2015-10-17: 08:00:00

## 2015-10-17 MED ORDER — EPHEDRINE SULFATE 50 MG/ML IJ SOLN
INTRAMUSCULAR | Status: DC | PRN
  Administered 2015-10-17 (×4): 10 mg via INTRAVENOUS
  Administered 2015-10-17: 5 mg via INTRAVENOUS

## 2015-10-17 MED ORDER — LIDOCAINE HCL (CARDIAC) 20 MG/ML IV SOLN
INTRAVENOUS | Status: DC | PRN
  Administered 2015-10-17: 60 mg via INTRAVENOUS

## 2015-10-17 MED ORDER — SODIUM CHLORIDE 0.9 % IJ SOLN
INTRAMUSCULAR | Status: AC
  Filled 2015-10-17: qty 10

## 2015-10-17 MED ORDER — DEXTROSE 50 % IV SOLN
25.0000 mL | INTRAVENOUS | Status: DC
  Filled 2015-10-17: qty 50

## 2015-10-17 MED ORDER — PROTAMINE SULFATE 10 MG/ML IV SOLN
INTRAVENOUS | Status: DC | PRN
  Administered 2015-10-17: 30 mg via INTRAVENOUS

## 2015-10-17 MED ORDER — SUCCINYLCHOLINE CHLORIDE 20 MG/ML IJ SOLN
INTRAMUSCULAR | Status: AC
  Filled 2015-10-17: qty 1

## 2015-10-17 MED ORDER — SODIUM CHLORIDE 0.9 % IV SOLN
INTRAVENOUS | Status: DC
  Administered 2015-10-17: 10 mL/h via INTRAVENOUS
  Administered 2015-10-17: 08:00:00 via INTRAVENOUS

## 2015-10-17 MED ORDER — FENTANYL CITRATE (PF) 250 MCG/5ML IJ SOLN
INTRAMUSCULAR | Status: AC
  Filled 2015-10-17: qty 5

## 2015-10-17 SURGICAL SUPPLY — 39 items
ARMBAND PINK RESTRICT EXTREMIT (MISCELLANEOUS) ×2 IMPLANT
CANISTER SUCTION 2500CC (MISCELLANEOUS) ×2 IMPLANT
CLIP TI MEDIUM 6 (CLIP) ×2 IMPLANT
CLIP TI WIDE RED SMALL 6 (CLIP) ×2 IMPLANT
COVER PROBE W GEL 5X96 (DRAPES) ×2 IMPLANT
DECANTER SPIKE VIAL GLASS SM (MISCELLANEOUS) IMPLANT
ELECT REM PT RETURN 9FT ADLT (ELECTROSURGICAL) ×2
ELECTRODE REM PT RTRN 9FT ADLT (ELECTROSURGICAL) ×1 IMPLANT
GLOVE BIO SURGEON STRL SZ7 (GLOVE) ×2 IMPLANT
GLOVE BIOGEL PI IND STRL 6.5 (GLOVE) ×4 IMPLANT
GLOVE BIOGEL PI IND STRL 7.0 (GLOVE) ×1 IMPLANT
GLOVE BIOGEL PI IND STRL 7.5 (GLOVE) ×1 IMPLANT
GLOVE BIOGEL PI INDICATOR 6.5 (GLOVE) ×4
GLOVE BIOGEL PI INDICATOR 7.0 (GLOVE) ×1
GLOVE BIOGEL PI INDICATOR 7.5 (GLOVE) ×1
GLOVE ECLIPSE 7.5 STRL STRAW (GLOVE) ×2 IMPLANT
GLOVE SS BIOGEL STRL SZ 6.5 (GLOVE) ×1 IMPLANT
GLOVE SUPERSENSE BIOGEL SZ 6.5 (GLOVE) ×1
GLOVE SURG SS PI 6.5 STRL IVOR (GLOVE) ×2 IMPLANT
GOWN STRL REUS W/ TWL LRG LVL3 (GOWN DISPOSABLE) ×3 IMPLANT
GOWN STRL REUS W/TWL LRG LVL3 (GOWN DISPOSABLE) ×3
GRAFT VASC ACUSEAL 4-7X45 (Vascular Products) ×2 IMPLANT
HEMOSTAT SPONGE AVITENE ULTRA (HEMOSTASIS) ×2 IMPLANT
INSERT FOGARTY SM (MISCELLANEOUS) ×2 IMPLANT
KIT BASIN OR (CUSTOM PROCEDURE TRAY) ×2 IMPLANT
KIT ROOM TURNOVER OR (KITS) ×2 IMPLANT
LIQUID BAND (GAUZE/BANDAGES/DRESSINGS) ×2 IMPLANT
NS IRRIG 1000ML POUR BTL (IV SOLUTION) ×2 IMPLANT
PACK CV ACCESS (CUSTOM PROCEDURE TRAY) ×2 IMPLANT
PAD ARMBOARD 7.5X6 YLW CONV (MISCELLANEOUS) ×4 IMPLANT
SUT MNCRL AB 4-0 PS2 18 (SUTURE) ×4 IMPLANT
SUT PROLENE 5 0 C 1 24 (SUTURE) IMPLANT
SUT PROLENE 6 0 BV (SUTURE) ×10 IMPLANT
SUT PROLENE 7 0 BV 1 (SUTURE) IMPLANT
SUT SILK 2 0 FS (SUTURE) ×2 IMPLANT
SUT VIC AB 3-0 SH 27 (SUTURE) ×2
SUT VIC AB 3-0 SH 27X BRD (SUTURE) ×2 IMPLANT
UNDERPAD 30X30 INCONTINENT (UNDERPADS AND DIAPERS) ×2 IMPLANT
WATER STERILE IRR 1000ML POUR (IV SOLUTION) IMPLANT

## 2015-10-17 NOTE — H&P (View-Only) (Signed)
  History of Present Illness  Kara Knapp is a 45 y.o. (04/19/1971) female known CKD stage IV who presents for permanent access placement.  She has progressed into CKD Stage V and now is ready to proceed with access placement.She denies any sx form uremia.  She thinks her nephrologist wants to start HD as soon as possible.  Past Medical History  Diagnosis Date  . Insulin-dependent diabetes mellitus with retinopathy ([facility])   . Asthma   . Hypertension   . Osteomyelitis ([facility])     left middle toe   . Cataract   . Bronchitis   . Pneumonia   . Depression   . Neuropathy due to secondary diabetes mellitus ([facility])   . Hyperlipidemia   . Fibroids   . Anemia of chronic disease   . Chronic kidney disease     Stage III-IV  . CHF (congestive heart failure) ([facility])     Presumed diastolic  . Cholecystitis, acute 05/26/2013    Status post cholecystectomy  . Tobacco abuse   . Diabetic foot ulcer ([facility]) 03/01/2015    Past Surgical History  Procedure Laterality Date  . Cesarean section    . Tubal ligation    . Cholecystectomy N/A 05/25/2013    Procedure: LAPAROSCOPIC CHOLECYSTECTOMY;  Surgeon: Mark A Jenkins, MD;  Location: [facility];  Service: General;  Laterality: N/A;  . Eye surgery    . Pars plana vitrectomy Left 11/24/2014    Procedure: PARS PLANA VITRECTOMY WITH 25 GAUGE;  Surgeon: Gary A Rankin, MD;  [facility];  Service: Ophthalmology;  Laterality: Left;  . Photocoagulation with laser Left 11/24/2014    Procedure: PHOTOCOAGULATION WITH LASER;  Surgeon: Gary A Rankin, MD;  Location[facility];  Service: Ophthalmology;  Laterality: Left;  with insertion of silicone oil  . Peripheral vascular catheterization N/A 04/28/2015    Procedure: Bilateral Upper Extremity Venography;  Surgeon: Umberto Pavek L Drusilla Wampole, MD;  [facility];  Service: Cardiovascular;  Laterality: N/A;    Social History   Social History  . Marital Status: Legally Separated    Spouse Name: N/A  . Number of Children:  N/A  . Years of Education: N/A   Occupational History  . Not on file.   Social History Main Topics  . Smoking status: Current Every Day Smoker -- 1.00 packs/day for 20 years    Types: Cigarettes  . Smokeless tobacco: Never Used  . Alcohol Use: No  . Drug Use: No  . Sexual Activity: Yes    Birth Control/ Protection: Surgical   Other Topics Concern  . Not on file   Social History Narrative    Family History  Problem Relation Age of Onset  . COPD Mother   . Cancer Father   . Diabetes Sister   . Deep vein thrombosis Sister   . Diabetes Brother   . Hyperlipidemia Brother   . Hypertension Brother   . Mental retardation Sister     Current Outpatient Prescriptions  Medication Sig Dispense Refill  . aspirin 81 MG tablet Take 81 mg by mouth daily.    . B Complex-C-Folic Acid (B COMPLEX-VITAMIN C-FOLIC ACID) 1 MG tablet Take 1 tablet by mouth daily with breakfast. 30 tablet 6  . blood glucose meter kit and supplies KIT Podigy Voice GLucometer - patient is visually impaired and needs meter that reads results.  Use to check BG up to QID.  Dx: E11.319 Z79.4 1 each 0  . brimonidine-timolol (COMBIGAN) 0.2-0.5 % ophthalmic   solution Place 1 drop into the right eye every 12 (twelve) hours.    . calcium acetate (PHOSLO) 667 MG capsule Take 1,334 mg by mouth 3 (three) times daily with meals.    . carvedilol (COREG) 6.25 MG tablet Take 1 tablet (6.25 mg total) by mouth 2 (two) times daily with a meal. Blood pressure medication. 60 tablet 2  . cholecalciferol (VITAMIN D) 1000 units tablet Take 1,000 Units by mouth daily.    . citalopram (CELEXA) 20 MG tablet Take 1 tablet (20 mg total) by mouth daily. Reported on 08/12/2015 (Patient taking differently: Take 30 mg by mouth daily. Reported on 08/12/2015) 90 tablet 3  . fluticasone (FLONASE) 50 MCG/ACT nasal spray Place 2 sprays into both nostrils daily. 16 g 11  . furosemide (LASIX) 40 MG tablet Take 2 tablets (80 mg total) by mouth daily. 30 tablet  4  . gabapentin (NEURONTIN) 300 MG capsule Take 1 capsule (300 mg total) by mouth 3 (three) times daily. 90 capsule 4  . glucose blood (ACCU-CHEK AVIVA) test strip Test up to qid. Dx E11.319 100 each 5  . HUMALOG KWIKPEN 100 UNIT/ML KiwkPen INJECT 7-10 UNITS INTO THE SKIN THREE TIMES DAILY. 15 mL 2  . hydrALAZINE (APRESOLINE) 25 MG tablet Take 1 tablet (25 mg total) by mouth 3 (three) times daily. 90 tablet 1  . LANTUS SOLOSTAR 100 UNIT/ML Solostar Pen INJECT 15 UNITS AT BEDTIME (Patient taking differently: inject 15 units every morning) 15 mL 0  . simvastatin (ZOCOR) 20 MG tablet TAKE ONE TABLET BY MOUTH DAILY AT 6 PM 34 tablet 5  . triamcinolone cream (KENALOG) 0.1 %      No current facility-administered medications for this visit.     Allergies  Allergen Reactions  . Bactrim [Sulfamethoxazole-Trimethoprim] Nausea And Vomiting  . Prednisone Other (See Comments)    "I was wide open and couldn't eat" per pt.      REVIEW OF SYSTEMS:  (Positives checked otherwise negative)  CARDIOVASCULAR:   [ ] chest pain,  [ ] chest pressure,  [ ] palpitations,  [ ] shortness of breath when laying flat,  [x] shortness of breath with exertion,   [ ] pain in feet when walking,  [ ] pain in feet when laying flat, [ ] history of blood clot in veins (DVT),  [ ] history of phlebitis,  [x] swelling in legs,  [ ] varicose veins  PULMONARY:   [ ] productive cough,  [ ] asthma,  [ ] wheezing  NEUROLOGIC:   [ ] weakness in arms or legs,  [ ] numbness in arms or legs,  [ ] difficulty speaking or slurred speech,  [ ] temporary loss of vision in one eye,  [ ] dizziness  HEMATOLOGIC:   [ ] bleeding problems,  [ ] problems with blood clotting too easily  MUSCULOSKEL:   [ ] joint pain, [ ] joint swelling  GASTROINTEST:   [ ] vomiting blood,  [ ] blood in stool     GENITOURINARY:   [ ] burning with urination,  [ ] blood in urine  PSYCHIATRIC:   [ ] history of major  depression  INTEGUMENTARY:   [ ] rashes,  [ ] ulcers  CONSTITUTIONAL:   [ ] fever,  [ ] chills   For VQI Use Only  PRE-ADM LIVING: Home  AMB STATUS: Ambulatory   Physical Examination Filed Vitals:   10/07/15 0918 10/07/15 0922  BP: 158/78 152/74  Pulse: 66     Temp: 97.2 F (36.2 C)   TempSrc: Oral   Resp: 18   Height: 5' 3" (1.6 m)   Weight: 195 lb 8 oz (88.678 kg)   SpO2: 96%    Body mass index is 34.64 kg/(m^2).  General: A&O x 3, WDWN,  Pulmonary: Sym exp, good air movt, CTAB, no rales, rhonchi, & wheezing  Cardiac: RRR, Nl S1, S2, no Murmurs, rubs or gallops  Vascular: Vessel Right Left  Radial Palpable Palpable  Ulnar Palpable Palpable  Brachial Palpable Palpable  Carotid Palpable, without bruit Palpable, without bruit  Aorta Not palpable N/A  Femoral Palpable Palpable  Popliteal Not palpable Not palpable  PT Palpable Palpable  DP Palpable Palpable   Gastrointestinal: soft, NTND, -G/R, - HSM, - masses, - CVAT B  Musculoskeletal: M/S 5/5 throughout , Extremities without ischemic changes  Neurologic: Pain and light touch intact in extremities , Motor exam as listed above   Medical Decision Making  Yared A Mcevers is a 45 y.o. (06/02/1971) female  who presents CKD Stage V with imminent ESRD .  Based on her venogram, I would recommend: RUE AVG placement with Accuseal   Risk, benefits, and alternatives to access surgery were discussed.    The patient is aware the risks include but are not limited to: bleeding, infection, steal syndrome, nerve damage, ischemic monomelic neuropathy, failure to mature, need for additional procedures, death and stroke.    The patient agrees to proceed forward with the procedure.  She is scheduled for 10/17/15  Thank you for allowing us to participate in this patient's care.  Ebrahim Deremer, MD [facility] Office:  [phone] Pager: [phone]  

## 2015-10-17 NOTE — Interval H&P Note (Signed)
History and Physical Interval Note:  10/17/2015 7:16 AM  Kara Knapp  has presented today for surgery, with the diagnosis of Stage IV Chronic Kidney Disease N18.4  The various methods of treatment have been discussed with the patient and family. After consideration of risks, benefits and other options for treatment, the patient has consented to  Procedure(s): INSERTION OF ARTERIOVENOUS (AV) GORE-TEX GRAFT ARM WITH ACCUSEAL (Right) as a surgical intervention .  The patient's history has been reviewed, patient examined, no change in status, stable for surgery.  I have reviewed the patient's chart and labs.  Questions were answered to the patient's satisfaction.     [name]

## 2015-10-17 NOTE — Transfer of Care (Signed)
Immediate Anesthesia Transfer of Care Note  Patient: Kara Knapp  Procedure(s) Performed: Procedure(s): INSERTION OF ARTERIOVENOUS GORE-TEX GRAFT RIGHT UPPER ARM WITH ACUSEAL (Right)  Patient Location: PACU  Anesthesia Type:General  Level of Consciousness: awake, alert  and oriented  Airway & Oxygen Therapy: Patient Spontanous Breathing and Patient connected to nasal cannula oxygen  Post-op Assessment: Report given to RN and Post -op Vital signs reviewed and stable  Post vital signs: Reviewed and stable  Last Vitals:  Filed Vitals:   10/17/15 0608 10/17/15 0945  BP: 155/74   Pulse: 59   Temp: 36.7 C 36.6 C  Resp: 18     Complications: No apparent anesthesia complications

## 2015-10-17 NOTE — Op Note (Signed)
OPERATIVE NOTE   PROCEDURE:  right upper arm arteriovenous graft with Accuseal  PRE-OPERATIVE DIAGNOSIS: imminent end stage renal disease   POST-OPERATIVE DIAGNOSIS: same as above   SURGEON: [name], MD  ANESTHESIA: general  ESTIMATED BLOOD LOSS: 50 cc  FINDING(S): 1. Faint thrill in vein at end of case 2. Strong radial signal 3. Likely high bifurcation  SPECIMEN(S):  none  INDICATIONS:   Kara Knapp is a 45 y.o. female who presents with imminent end stage renal disease.  Her nephrologist requested placement of an early stick arteriovenous graft.  Based on venography, her best option was a right upper arm arteriovenous graft with Accuseal.  Risk, benefits, and alternatives to access surgery were discussed.  The patient is aware the risks include but are not limited to: bleeding, infection, steal syndrome, nerve damage, ischemic monomelic neuropathy, failure to mature, and need for additional procedures.  The patient is aware of the risks and elects to proceed forward.  DESCRIPTION: After full informed written consent was obtained from the patient, the patient was brought back to the operating room and placed supine upon the operating table.  The patient was given IV antibiotics prior to proceeding.  After obtaining adequate sedation, the patient was prepped and draped in standard fashion for a right arm access procedure.  I turned my attention first to the antecubitum.  Under ultrasound guidance, I identified the location of the brachial artery and marked it on the skin.  I then examined the bicipital groove and identified the high brachial vein and marked it on the skin.  I made an incision over the brachial artery, and dissected down through the subcutaneous tissue to the fascia carefully and was able to dissect out the brachial artery.  The artery was about 2.5 mm externally.  It was controlled proximally and distally with vessel loops.  I interrogated the medial tissue,  verifying my suspicion there was already a ulnar signal medially, consistent with a likely high bifurcation in the brachial artery.  I turned my attention to the high bicipital groove.  I made an incision over the brachial vein and dissected down through the subcutaneous tissue and fascia until I reached the high rachial vein.  Externally, it appeared to be 7 mm in diameter.  I then dissected this vein proximally and distal.  I took a metal Gore tunneler and dissected from the antecubital incision up to the high bicipital incision.  Then I delivered the 4 x 7-mm Accuseal graft, through this metal tunneler and then pulled out the metal tunneler leaving the graft in place.  The 4-mm end was left on the antecubital side and the 7 mm toward the axillary side.  I then gave the patient 5000 units of heparin to gain some anticoagulation.  After waiting 3 minutes, I placed the brachial artery under tension proximally and distally with vessel loops, made an arteriotomy and extended it with a Potts scissor.  I sewed the 4-mm end of the graft to this arteriotomy with a running stitch of 6-0 Prolene.  At this point, then I completed the anastomosis in the usual fashion.  I released the vessel loops on the inflow and allowed the artery to decompress through the graft. There was non-pulsatile but continuous bleeding.  This patient's blood pressure was low at this time and required some vasopressors to raise the blood pressure adequately to get a pulse in the graft.  I clamped the graft near its arterial anastomosis and sucked out  all the blood in the graft and loaded the graft with heparinized saline.  At this point, I pulled the graft to appropriate length and reset my exposure of the high brachial vein.  I tied off the high brachial vein distally with a 2-0 silk and then transected it.  There was good venous backbleeding from the vein.  Then, I injected some heparinized saline into this vein and then clamped it.  I sharply  shortened the graft to facilitate an end-to-end anastomosis.  This graft was sewn to the vein in an end-to-end configuration with a 6-0 Prolene.  Prior to completing this anastomosis, I allowed the vein to back bleed and then I also allowed the artery to bleed in an antegrade fashion.  I completed this anastomosis in the usual fashion, and then irrigated out the high bicipital exposure and then placed Avitene.  I then turned my attention back to the antecubitum.  The distal radial signal was dopplerable.  Using a continuous Doppler, the brachial artery proximally and distally had multiphasic waveforms.  The venous outflow had a flow signature consistent with widely patent arteriovenous graft.  At this point, I washed out the antecubital incision.  There was no more active bleeding.  The subcutaneous tissue in both incisions was reapproximated with a running stitch of 3-0 Vicryl.  The skin in both incisions was then reapproximated with a running subcuticular 4-0 Monocryl.  The skin at both incisions was then cleaned, dried, and Dermabond used to reinforce the skin closure.     COMPLICATIONS: none  CONDITION: stable   [name], MD [facility] Office: [phone] Pager: [phone]  10/17/2015, 9:30 AM

## 2015-10-17 NOTE — Anesthesia Postprocedure Evaluation (Signed)
Anesthesia Post Note  Patient: Kara Knapp  Procedure(s) Performed: Procedure(s) (LRB): INSERTION OF ARTERIOVENOUS GORE-TEX GRAFT RIGHT UPPER ARM WITH ACUSEAL (Right)  Patient location during evaluation: PACU Anesthesia Type: General Level of consciousness: awake and alert Pain management: pain level controlled Vital Signs Assessment: post-procedure vital signs reviewed and stable Respiratory status: spontaneous breathing, nonlabored ventilation, respiratory function stable and patient connected to nasal cannula oxygen Cardiovascular status: blood pressure returned to baseline and stable Postop Assessment: no signs of nausea or vomiting Anesthetic complications: no    Last Vitals:  Filed Vitals:   10/17/15 1115 10/17/15 1116  BP:  116/60  Pulse: 57 58  Temp:    Resp: 21 21    Last Pain:  Filed Vitals:   10/17/15 1118  PainSc: Asleep                 [name],JAMES TERRILL

## 2015-10-17 NOTE — Anesthesia Procedure Notes (Signed)
Procedure Name: LMA Insertion Date/Time: 10/17/2015 7:30 AM Performed by: Manuela Schwartz B Pre-anesthesia Checklist: Patient identified, Emergency Drugs available, Suction available, Patient being monitored and Timeout performed Patient Re-evaluated:Patient Re-evaluated prior to inductionOxygen Delivery Method: Circle system utilized Preoxygenation: Pre-oxygenation with 100% oxygen Intubation Type: IV induction LMA: LMA inserted LMA Size: 4.0 Number of attempts: 1 Placement Confirmation: positive ETCO2 and breath sounds checked- equal and bilateral Tube secured with: Tape Dental Injury: Teeth and Oropharynx as per pre-operative assessment

## 2015-10-17 NOTE — Discharge Instructions (Signed)
° ° °  10/17/2015 Kara Knapp [id number] August 24, 1970  Surgeon(s): Conrad Millbrook, MD  Procedure(s): INSERTION OF ARTERIOVENOUS GORE-TEX GRAFT RIGHT UPPER ARM WITH ACUSEAL   Instructions for cannulation of Accuaseal Graft:  A.  First 3 sessions, use 17g needle B.  Next 3 sessions, use 16g needle C.  Next 3 sessions, use 15g needle  Hold pressure for 15-20 minutes  D.  After 3 weeks, go back to normal cannulation protocols for grafts.

## 2015-10-17 NOTE — Anesthesia Preprocedure Evaluation (Addendum)
Anesthesia Evaluation  Patient identified by MRN, date of birth, ID band Patient awake    Reviewed: Allergy & Precautions, NPO status , Patient's Chart, lab work & pertinent test results  Airway Mallampati: II  TM Distance: >3 FB Neck ROM: Full    Dental  (+) Edentulous Upper, Edentulous Lower, Dental Advisory Given   Pulmonary asthma , Current Smoker,    breath sounds clear to auscultation       Cardiovascular hypertension, Pt. on medications and Pt. on home beta blockers +CHF   Rhythm:Regular Rate:Normal     Neuro/Psych    GI/Hepatic   Endo/Other  diabetes, Type 1, Insulin Dependent  Renal/GU ESRFRenal disease     Musculoskeletal   Abdominal (+) + obese,   Peds  Hematology  (+) anemia , Hgb 8.8   Anesthesia Other Findings   Reproductive/Obstetrics                           Anesthesia Physical Anesthesia Plan  ASA: IV  Anesthesia Plan: General   Post-op Pain Management:    Induction: Intravenous  Airway Management Planned: LMA  Additional Equipment:   Intra-op Plan:   Post-operative Plan: Extubation in OR  Informed Consent: I have reviewed the patients History and Physical, chart, labs and discussed the procedure including the risks, benefits and alternatives for the proposed anesthesia with the patient or authorized representative who has indicated his/her understanding and acceptance.   Dental advisory given  Plan Discussed with: CRNA, Surgeon and Anesthesiologist  Anesthesia Plan Comments:       Anesthesia Quick Evaluation

## 2015-10-18 ENCOUNTER — Encounter (HOSPITAL_COMMUNITY): Payer: Self-pay | Admitting: Vascular Surgery

## 2015-10-19 ENCOUNTER — Telehealth: Payer: Self-pay | Admitting: Vascular Surgery

## 2015-10-19 NOTE — Telephone Encounter (Signed)
LVM for pt re appt, dpm

## 2015-10-19 NOTE — Telephone Encounter (Signed)
-----   Message from Mena Goes, RN sent at 10/17/2015  9:22 AM EDT ----- Regarding: schedule   ----- Message -----    From: Gabriel Earing, PA-C    Sent: 10/17/2015   8:47 AM      To: Vvs Charge Pool  F/u with Dr. Bridgett Larsson in 4 weeks s/p Accuseal graft placement 10/17/15.  Thanks, Aldona Bar

## 2015-10-20 ENCOUNTER — Ambulatory Visit: Payer: Medicaid Other | Admitting: Family Medicine

## 2015-10-21 ENCOUNTER — Other Ambulatory Visit: Payer: Self-pay | Admitting: Family Medicine

## 2015-10-27 ENCOUNTER — Other Ambulatory Visit: Payer: Self-pay

## 2015-10-27 MED ORDER — INSULIN GLARGINE 100 UNIT/ML SOLOSTAR PEN: PEN_INJECTOR | SUBCUTANEOUS | Status: DC

## 2015-11-01 ENCOUNTER — Telehealth: Payer: Self-pay | Admitting: Family Medicine

## 2015-11-01 NOTE — Telephone Encounter (Signed)
If she is symptomatic she may need to be seen.   If she is not, it is not a dangerously low BP but is definitely low for her. We should meet in the next few weeks to discuss BP meds.   She has had a recent AV graft put in so if she is checking on that arm (maybe with a wrist cuff) she may get readings that are not as reliable.   [name], MD [facility] 11/01/2015, 12:22 PM

## 2015-11-01 NOTE — Telephone Encounter (Signed)
Patient states that BP has been running low with medications 106 62 last night and 121/54 hr 64 this morning.

## 2015-11-01 NOTE — Telephone Encounter (Signed)
Patient's BP in left arm is 159/91. Patient informed to make sure she takes BP in left arm and not in the right arm where AV graft was done

## 2015-11-09 ENCOUNTER — Encounter: Payer: Self-pay | Admitting: Vascular Surgery

## 2015-11-10 DIAGNOSIS — D539 Nutritional anemia, unspecified: Secondary | ICD-10-CM | POA: Diagnosis not present

## 2015-11-10 DIAGNOSIS — N189 Chronic kidney disease, unspecified: Secondary | ICD-10-CM | POA: Diagnosis not present

## 2015-11-10 DIAGNOSIS — R600 Localized edema: Secondary | ICD-10-CM | POA: Diagnosis not present

## 2015-11-18 ENCOUNTER — Other Ambulatory Visit: Payer: Self-pay | Admitting: *Deleted

## 2015-11-18 ENCOUNTER — Ambulatory Visit (INDEPENDENT_AMBULATORY_CARE_PROVIDER_SITE_OTHER): Payer: Self-pay | Admitting: Vascular Surgery

## 2015-11-18 ENCOUNTER — Encounter: Payer: Self-pay | Admitting: Vascular Surgery

## 2015-11-18 VITALS — BP 132/67 | HR 64 | Temp 97.1°F | Resp 16 | Ht 63.0 in | Wt 213.0 lb

## 2015-11-18 DIAGNOSIS — N185 Chronic kidney disease, stage 5: Secondary | ICD-10-CM

## 2015-11-18 MED ORDER — CEPHALEXIN 500 MG PO CAPS: 500.0000 mg | ORAL_CAPSULE | Freq: Two times a day (BID) | ORAL | Status: DC

## 2015-11-18 MED ORDER — FUROSEMIDE 40 MG PO TABS: ORAL_TABLET | ORAL | Status: DC

## 2015-11-18 NOTE — Progress Notes (Signed)
    Postoperative Access Visit   History of Present Illness  Kara Knapp is a 45 y.o. year old female who presents for postoperative follow-up for: RUA AVG with Accuseal (Date: 10/17/15).  The patient's wounds are healed.  The patient notes no steal symptoms.  The patient is able to complete their activities of daily living.  The patient's current symptoms are: pain at axilla incision.  The patient's axilla incision separate sometime this week.  She notes notes no drainage and no fever or chills.  Pt is complaining about total body fluid retention  For VQI Use Only  PRE-ADM LIVING: Home  AMB STATUS: Ambulatory  Physical Examination Filed Vitals:   11/18/15 1050  BP: 132/67  Pulse: 64  Temp: 97.1 F (36.2 C)  Resp: 16    RUE: distal incision is healed, axillary incision is separated superficially, some dry fat evident, graft not exposed, mild TTP around axillary incision without erythema, skin feels warm, hand grip is 5/5, sensation in digits is intact, palpable thrill, bruit can be auscultated, palpable radial pulse  Medical Decision Making  Kara Knapp is a 45 y.o. year old female who presents s/p RUA AVG with Accuseal complicated by superficial separation of R axillary incision, mild cellulitis.   I prescribed the patient: Keflex 500 mg 1 po bid x 7 days.  Home wound care will be set up for help with mgmt of the R axillary wound.  This patient's sx are consistent with imminent ESRD, so I encourage her to follow up with her nephrologist.  She will follow up with me in the next 2 weeks for wound check.  Thank you for allowing [place] to participate in this patient's care.  [name], MD [facility] Office: [phone] Pager: [phone]  11/18/2015, 8:18 AM

## 2015-11-21 ENCOUNTER — Ambulatory Visit: Payer: Medicaid Other | Admitting: Podiatry

## 2015-11-25 ENCOUNTER — Telehealth: Payer: Self-pay

## 2015-11-25 DIAGNOSIS — T8189XA Other complications of procedures, not elsewhere classified, initial encounter: Secondary | ICD-10-CM

## 2015-11-25 MED ORDER — CARRASYN HYDROGEL WOUND DRESS EX GEL: CUTANEOUS | Status: DC | PRN

## 2015-11-25 NOTE — Telephone Encounter (Signed)
Phone call from pt.  Reported she has not rec'd any notification from [facility] that a nurse is going to check her wound.  Noted last office note stated that Dr. Bridgett Larsson advised to apply Hydrogel to right Axillary wound daily per [facility] nurse.  Attempted to call several [facility] to initiate the wound treatment to right axillary area.  No agencies have any staffing to admit the pt. At this time.  Discussed with pt's son the recommended treatment.  Son stated he has been cleaning the right axillary incision wound daily and applying Neosporin Oint. And a bandaide.  Stated there is a little redness around the wound.  Instructed on cleaning the wound with antibacterial soap and water daily and applying Hydrogel Oint to the wound daily with dry dressing.  Informed that will order Hydrogel at the pharmacy.  Advised the son to call if there is any worsening of symptoms.  Verb. Understanding.

## 2015-12-01 ENCOUNTER — Telehealth: Payer: Self-pay | Admitting: Family Medicine

## 2015-12-01 NOTE — Telephone Encounter (Signed)
Spoke to pt and she states she had surgery with the vascular surgeon and now it looks infected. Advised pt she would need to call the vascular surgeon. Pt voiced understanding.

## 2015-12-02 ENCOUNTER — Encounter: Payer: Self-pay | Admitting: Vascular Surgery

## 2015-12-07 ENCOUNTER — Ambulatory Visit: Payer: Medicaid Other | Admitting: Family Medicine

## 2015-12-07 DIAGNOSIS — E113511 Type 2 diabetes mellitus with proliferative diabetic retinopathy with macular edema, right eye: Secondary | ICD-10-CM | POA: Diagnosis not present

## 2015-12-07 DIAGNOSIS — H211X1 Other vascular disorders of iris and ciliary body, right eye: Secondary | ICD-10-CM | POA: Diagnosis not present

## 2015-12-07 DIAGNOSIS — H4051X3 Glaucoma secondary to other eye disorders, right eye, severe stage: Secondary | ICD-10-CM | POA: Diagnosis not present

## 2015-12-07 DIAGNOSIS — H35371 Puckering of macula, right eye: Secondary | ICD-10-CM | POA: Diagnosis not present

## 2015-12-08 NOTE — Progress Notes (Deleted)
    Postoperative Access Visit   History of Present Illness  Kara Knapp is a 45 y.o. ([birth year]) female  who presents for postoperative follow-up for: RUA AVG with Accuseal (Date: 10/17/15). The patient's axillary incision previously separated and wound care was initiated.  ***The patient notes no steal symptoms. The patient is able to complete their activities of daily living. The patient's current symptoms are: ***.  She has *** been taking her abx.   For VQI Use Only  PRE-ADM LIVING: Home  AMB STATUS: Ambulatory  Physical Examination There were no vitals filed for this visit.  RUE: distal incision is healed, axillary incision is separated superficially, some dry fat evident, graft not exposed, mild TTP around axillary incision without erythema, skin feels warm, hand grip is 5/5, sensation in digits is intact, palpable thrill, bruit can be auscultated, palpable radial pulse   Medical Decision Making  Kara Knapp is a 45 y.o. (31-Jul-1970) female  who presents s/p RUA AVG with Accuseal complicated by superficial separation of R axillary incision, mild cellulitis.   ***  Thank you for allowing [place] to participate in this patient's care.  [name], MD, FACS [facility] Office: [phone] Pager: [phone]  12/08/2015, 10:21 AM

## 2015-12-09 ENCOUNTER — Encounter: Payer: Medicaid Other | Admitting: Vascular Surgery

## 2015-12-09 ENCOUNTER — Encounter: Payer: Self-pay | Admitting: Vascular Surgery

## 2015-12-12 NOTE — Progress Notes (Signed)
This encounter was created in error - please disregard.

## 2015-12-13 ENCOUNTER — Ambulatory Visit: Payer: Medicaid Other | Admitting: Family Medicine

## 2015-12-13 ENCOUNTER — Ambulatory Visit (INDEPENDENT_AMBULATORY_CARE_PROVIDER_SITE_OTHER): Payer: Medicaid Other | Admitting: Family Medicine

## 2015-12-13 ENCOUNTER — Encounter: Payer: Self-pay | Admitting: Family Medicine

## 2015-12-13 VITALS — BP 159/82 | HR 60 | Temp 95.3°F | Ht 63.0 in | Wt 201.8 lb

## 2015-12-13 DIAGNOSIS — E11319 Type 2 diabetes mellitus with unspecified diabetic retinopathy without macular edema: Secondary | ICD-10-CM

## 2015-12-13 DIAGNOSIS — W57XXXA Bitten or stung by nonvenomous insect and other nonvenomous arthropods, initial encounter: Secondary | ICD-10-CM | POA: Diagnosis not present

## 2015-12-13 DIAGNOSIS — Z794 Long term (current) use of insulin: Secondary | ICD-10-CM

## 2015-12-13 DIAGNOSIS — I10 Essential (primary) hypertension: Secondary | ICD-10-CM

## 2015-12-13 DIAGNOSIS — S3092XA Unspecified superficial injury of abdominal wall, initial encounter: Secondary | ICD-10-CM | POA: Diagnosis not present

## 2015-12-13 DIAGNOSIS — N185 Chronic kidney disease, stage 5: Secondary | ICD-10-CM | POA: Diagnosis not present

## 2015-12-13 DIAGNOSIS — IMO0001 Reserved for inherently not codable concepts without codable children: Secondary | ICD-10-CM

## 2015-12-13 LAB — BAYER DCA HB A1C WAIVED: HB A1C (BAYER DCA - WAIVED): 8.1 % — ABNORMAL HIGH (ref <7.0)

## 2015-12-13 MED ORDER — FUROSEMIDE 40 MG PO TABS: ORAL_TABLET | ORAL | Status: DC

## 2015-12-13 MED ORDER — HYDROCORTISONE 2.5 % EX CREA: TOPICAL_CREAM | Freq: Two times a day (BID) | CUTANEOUS | Status: DC

## 2015-12-13 NOTE — Patient Instructions (Addendum)
Great to see you!  Try the steroid cream for your itching  Consider treating you carpet for fleas, wash your sheets and pillowcases and dry with high heat  We will call with results of lab work within 1 week

## 2015-12-13 NOTE — Progress Notes (Addendum)
HPI  Patient presents today I'll up and itching.  Patient explains she's had diffuse itching for the last 2 weeks, it seems to have started after she finished Keflex. She has no overt rash or swelling in any area. She has small red dots all over her body, she's been told that possibly bedbugs. She thinks it may be fleas.  Diabetes Taking Lantus inconsistently, a proximally 4 days a week, 15 units No hypoglycemia Average fasting 150-200 Watching her diet intermittently.  CAD, CHF Watching her diet much closer as it pertains to salt Following her fluid restriction Reports easy breathing today and baseline swelling.  PMH: Smoking status noted ROS: Per HPI  Objective: BP 159/82 mmHg  Pulse 60  Temp(Src) 95.3 F (35.2 C) (Oral)  Ht 5' 3"  (1.6 m)  Wt 201 lb 12.8 oz (91.536 kg)  BMI 35.76 kg/m2 Gen: NAD, alert, cooperative with exam HEENT: NCAT CV: RRR, good S1/S2, no murmur Resp: CTABL, no wheezes, non-labored Ext: No edema, warm Neuro: Alert and oriented, No gross deficits Skin: Small erythematous papules with red bases consistent with arthropod bites scattered across abdomen, arms, and lower legs  Diabetic Foot Exam - Simple   Simple Foot Form  Visual Inspection  No deformities, no ulcerations, no other skin breakdown bilaterally:  Yes  See comments:  Yes  Sensation Testing  Intact to touch and monofilament testing bilaterally:  Yes  Pulse Check  Posterior Tibialis and Dorsalis pulse intact bilaterally:  Yes  Comments  Right third toe with partial toenail removed, no signs of infection, he does have heme crusting       Assessment and plan:  # Type 2 diabetes Control slipping, A1c 8.1 Continue Lantus, emphasized good compliance, if she takes it daily at least she'll be easily within the 7-8 range. No hypoglycemia Positive non-bothersome neuropathy. She sees Dr. Zadie Rhine for ophthalmology, she will have him send her next visit in 3 months  #  Hypertension Elevated today, however it has been very well managed on recent visits to vascular surgery Continue current medications including hydralazine and carvedilol for now. Her pulse is 60, she's likely well beta blocked  # Itching, arthropod bites Itching possibly multifactorial, BUN elevated last visit to 50, pending today Treating arthropod bites with hydrocortisone, discussed strategies for eradicating fleas which she is most suspicious.  # CKD stage IV Recent fistula placement, treated with Keflex from vascular surgery for presumed infection. Appears to be healing well at this time, she does have a small amount of heme crusting in her left upper medial arm with no signs of infection Palpable thrill in the fistula Labs today Refilled Lasix, Zaroxolyn per nephrology     Orders Placed This Encounter  Procedures  . Bayer DCA Hb A1c Waived  . CMP14+EGFR  . CBC with Differential    Meds ordered this encounter  Medications  . metolazone (ZAROXOLYN) 5 MG tablet    Sig:   . hydrocortisone 2.5 % cream    Sig: Apply topically 2 (two) times daily.    Dispense:  30 g    Refill:  0  . furosemide (LASIX) 40 MG tablet    Sig: TAKE TWO (2) TABLETS BY MOUTH DAILY.    Dispense:  60 tablet    Refill:  3    This prescription was filled today(10/21/2015). Any refills authorized will be placed on file.    [name], MD [facility] 12/13/2015, 11:30 AM     Results of lab critical  values showed glucose at 404 and creatinine of 5.09. Attempted to call patient to check on her. The numbers themselves are not in my opinion critical. Patient did not answer phone and there was no opportunity to leave voicemail.

## 2015-12-14 LAB — CBC WITH DIFFERENTIAL/PLATELET
BASOS: 1 %
Basophils Absolute: 0.1 10*3/uL (ref 0.0–0.2)
EOS (ABSOLUTE): 0.2 10*3/uL (ref 0.0–0.4)
EOS: 2 %
HEMATOCRIT: 36.3 % (ref 34.0–46.6)
Hemoglobin: 11.2 g/dL (ref 11.1–15.9)
IMMATURE GRANS (ABS): 0 10*3/uL (ref 0.0–0.1)
IMMATURE GRANULOCYTES: 0 %
LYMPHS: 14 %
Lymphocytes Absolute: 1 10*3/uL (ref 0.7–3.1)
MCH: 31.5 pg (ref 26.6–33.0)
MCHC: 30.9 g/dL — ABNORMAL LOW (ref 31.5–35.7)
MCV: 102 fL — ABNORMAL HIGH (ref 79–97)
MONOCYTES: 10 %
MONOS ABS: 0.7 10*3/uL (ref 0.1–0.9)
NEUTROS PCT: 73 %
Neutrophils Absolute: 5.1 10*3/uL (ref 1.4–7.0)
Platelets: 288 10*3/uL (ref 150–379)
RBC: 3.56 x10E6/uL — AB (ref 3.77–5.28)
RDW: 16.6 % — AB (ref 12.3–15.4)
WBC: 6.9 10*3/uL (ref 3.4–10.8)

## 2015-12-14 LAB — CMP14+EGFR
A/G RATIO: 1.3 (ref 1.2–2.2)
ALBUMIN: 3.5 g/dL (ref 3.5–5.5)
ALK PHOS: 453 IU/L — AB (ref 39–117)
ALT: 60 IU/L — ABNORMAL HIGH (ref 0–32)
AST: 43 IU/L — ABNORMAL HIGH (ref 0–40)
BILIRUBIN TOTAL: 0.8 mg/dL (ref 0.0–1.2)
BUN / CREAT RATIO: 11 (ref 9–23)
BUN: 54 mg/dL — AB (ref 6–24)
CO2: 18 mmol/L (ref 18–29)
Calcium: 9.6 mg/dL (ref 8.7–10.2)
Chloride: 95 mmol/L — ABNORMAL LOW (ref 96–106)
Creatinine, Ser: 5.09 mg/dL (ref 0.57–1.00)
GFR calc non Af Amer: 9 mL/min/{1.73_m2} — ABNORMAL LOW (ref >59)
GFR, EST AFRICAN AMERICAN: 11 mL/min/{1.73_m2} — AB (ref >59)
GLOBULIN, TOTAL: 2.7 g/dL (ref 1.5–4.5)
GLUCOSE: 404 mg/dL — AB (ref 65–99)
Potassium: 4.6 mmol/L (ref 3.5–5.2)
SODIUM: 135 mmol/L (ref 134–144)
TOTAL PROTEIN: 6.2 g/dL (ref 6.0–8.5)

## 2015-12-16 ENCOUNTER — Ambulatory Visit: Payer: Medicaid Other | Admitting: Vascular Surgery

## 2015-12-27 ENCOUNTER — Encounter: Payer: Self-pay | Admitting: Vascular Surgery

## 2015-12-27 DIAGNOSIS — E109 Type 1 diabetes mellitus without complications: Secondary | ICD-10-CM | POA: Diagnosis not present

## 2015-12-28 DIAGNOSIS — E109 Type 1 diabetes mellitus without complications: Secondary | ICD-10-CM | POA: Diagnosis not present

## 2015-12-29 DIAGNOSIS — E1129 Type 2 diabetes mellitus with other diabetic kidney complication: Secondary | ICD-10-CM | POA: Diagnosis not present

## 2015-12-29 DIAGNOSIS — E872 Acidosis: Secondary | ICD-10-CM | POA: Diagnosis not present

## 2015-12-29 DIAGNOSIS — I10 Essential (primary) hypertension: Secondary | ICD-10-CM | POA: Diagnosis not present

## 2015-12-29 DIAGNOSIS — N19 Unspecified kidney failure: Secondary | ICD-10-CM | POA: Diagnosis not present

## 2015-12-29 DIAGNOSIS — E109 Type 1 diabetes mellitus without complications: Secondary | ICD-10-CM | POA: Diagnosis not present

## 2015-12-29 DIAGNOSIS — D638 Anemia in other chronic diseases classified elsewhere: Secondary | ICD-10-CM | POA: Diagnosis not present

## 2015-12-29 DIAGNOSIS — E11319 Type 2 diabetes mellitus with unspecified diabetic retinopathy without macular edema: Secondary | ICD-10-CM | POA: Diagnosis not present

## 2015-12-30 DIAGNOSIS — D638 Anemia in other chronic diseases classified elsewhere: Secondary | ICD-10-CM | POA: Diagnosis not present

## 2015-12-30 DIAGNOSIS — I10 Essential (primary) hypertension: Secondary | ICD-10-CM | POA: Diagnosis not present

## 2015-12-30 DIAGNOSIS — N19 Unspecified kidney failure: Secondary | ICD-10-CM | POA: Diagnosis not present

## 2015-12-30 DIAGNOSIS — E109 Type 1 diabetes mellitus without complications: Secondary | ICD-10-CM | POA: Diagnosis not present

## 2015-12-30 DIAGNOSIS — E11319 Type 2 diabetes mellitus with unspecified diabetic retinopathy without macular edema: Secondary | ICD-10-CM | POA: Diagnosis not present

## 2015-12-30 DIAGNOSIS — E1129 Type 2 diabetes mellitus with other diabetic kidney complication: Secondary | ICD-10-CM | POA: Diagnosis not present

## 2015-12-30 DIAGNOSIS — E872 Acidosis: Secondary | ICD-10-CM | POA: Diagnosis not present

## 2015-12-31 DIAGNOSIS — D638 Anemia in other chronic diseases classified elsewhere: Secondary | ICD-10-CM | POA: Diagnosis not present

## 2015-12-31 DIAGNOSIS — I10 Essential (primary) hypertension: Secondary | ICD-10-CM | POA: Diagnosis not present

## 2015-12-31 DIAGNOSIS — E109 Type 1 diabetes mellitus without complications: Secondary | ICD-10-CM | POA: Diagnosis not present

## 2015-12-31 DIAGNOSIS — E872 Acidosis: Secondary | ICD-10-CM | POA: Diagnosis not present

## 2015-12-31 DIAGNOSIS — Z992 Dependence on renal dialysis: Secondary | ICD-10-CM | POA: Diagnosis not present

## 2015-12-31 DIAGNOSIS — E11319 Type 2 diabetes mellitus with unspecified diabetic retinopathy without macular edema: Secondary | ICD-10-CM | POA: Diagnosis not present

## 2015-12-31 DIAGNOSIS — N186 End stage renal disease: Secondary | ICD-10-CM | POA: Diagnosis not present

## 2016-01-01 DIAGNOSIS — E109 Type 1 diabetes mellitus without complications: Secondary | ICD-10-CM | POA: Diagnosis not present

## 2016-01-02 DIAGNOSIS — E871 Hypo-osmolality and hyponatremia: Secondary | ICD-10-CM | POA: Diagnosis not present

## 2016-01-02 DIAGNOSIS — N390 Urinary tract infection, site not specified: Secondary | ICD-10-CM | POA: Diagnosis not present

## 2016-01-02 DIAGNOSIS — E872 Acidosis: Secondary | ICD-10-CM | POA: Diagnosis not present

## 2016-01-02 DIAGNOSIS — E1129 Type 2 diabetes mellitus with other diabetic kidney complication: Secondary | ICD-10-CM | POA: Diagnosis not present

## 2016-01-02 DIAGNOSIS — Z992 Dependence on renal dialysis: Secondary | ICD-10-CM | POA: Diagnosis not present

## 2016-01-02 DIAGNOSIS — E11319 Type 2 diabetes mellitus with unspecified diabetic retinopathy without macular edema: Secondary | ICD-10-CM | POA: Diagnosis not present

## 2016-01-02 DIAGNOSIS — L03111 Cellulitis of right axilla: Secondary | ICD-10-CM | POA: Diagnosis not present

## 2016-01-02 DIAGNOSIS — D638 Anemia in other chronic diseases classified elsewhere: Secondary | ICD-10-CM | POA: Diagnosis not present

## 2016-01-02 DIAGNOSIS — I10 Essential (primary) hypertension: Secondary | ICD-10-CM | POA: Diagnosis not present

## 2016-01-02 DIAGNOSIS — I120 Hypertensive chronic kidney disease with stage 5 chronic kidney disease or end stage renal disease: Secondary | ICD-10-CM | POA: Diagnosis not present

## 2016-01-02 DIAGNOSIS — N186 End stage renal disease: Secondary | ICD-10-CM | POA: Diagnosis not present

## 2016-01-03 ENCOUNTER — Encounter (INDEPENDENT_AMBULATORY_CARE_PROVIDER_SITE_OTHER): Payer: Medicaid Other | Admitting: Podiatry

## 2016-01-03 ENCOUNTER — Other Ambulatory Visit: Payer: Self-pay | Admitting: Family Medicine

## 2016-01-03 DIAGNOSIS — I259 Chronic ischemic heart disease, unspecified: Secondary | ICD-10-CM | POA: Diagnosis not present

## 2016-01-03 DIAGNOSIS — E162 Hypoglycemia, unspecified: Secondary | ICD-10-CM | POA: Diagnosis not present

## 2016-01-03 DIAGNOSIS — E119 Type 2 diabetes mellitus without complications: Secondary | ICD-10-CM | POA: Diagnosis not present

## 2016-01-03 DIAGNOSIS — B9689 Other specified bacterial agents as the cause of diseases classified elsewhere: Secondary | ICD-10-CM | POA: Diagnosis not present

## 2016-01-03 DIAGNOSIS — E11649 Type 2 diabetes mellitus with hypoglycemia without coma: Secondary | ICD-10-CM | POA: Diagnosis not present

## 2016-01-03 DIAGNOSIS — D631 Anemia in chronic kidney disease: Secondary | ICD-10-CM | POA: Diagnosis not present

## 2016-01-03 DIAGNOSIS — N186 End stage renal disease: Secondary | ICD-10-CM | POA: Diagnosis not present

## 2016-01-03 DIAGNOSIS — Z992 Dependence on renal dialysis: Secondary | ICD-10-CM | POA: Diagnosis not present

## 2016-01-03 DIAGNOSIS — L02213 Cutaneous abscess of chest wall: Secondary | ICD-10-CM | POA: Diagnosis not present

## 2016-01-03 DIAGNOSIS — N250 Renal osteodystrophy: Secondary | ICD-10-CM | POA: Diagnosis not present

## 2016-01-03 DIAGNOSIS — Z794 Long term (current) use of insulin: Secondary | ICD-10-CM | POA: Diagnosis not present

## 2016-01-03 NOTE — Progress Notes (Signed)
This encounter was created in error - please disregard.

## 2016-01-04 ENCOUNTER — Encounter: Payer: Medicaid Other | Admitting: Vascular Surgery

## 2016-01-04 NOTE — Progress Notes (Deleted)
    Postoperative Access Visit   History of Present Illness  Bobbie A Meriweather is a 45 y.o. (1971/03/05) female who presents for postoperative follow-up for: RUA AVG with Accuseal (Date: 10/17/15).  Pt is current ***ESRD.  She returns for re-evaluation of her R axillary incision which had partially separated.  Her wounds has ***healed.  She ***finished her abx.   For VQI Use Only  PRE-ADM LIVING: Home  AMB STATUS: Ambulatory  Physical Examination  There were no vitals filed for this visit.  RUE: ***distal incision is healed, axillary incision is separated superficially, some dry fat evident, graft not exposed, mild TTP around axillary incision without erythema, skin feels warm, hand grip is 5/5, sensation in digits is intact, palpable thrill, bruit can be auscultated, palpable radial pulse  Medical Decision Making  Nira A Gade is a 45 y.o. (1971-03-18) female who presents s/p RUA AVG with Accuseal complicated by superficial separation of R axillary incision, mild cellulitis.   ***  Thank you for allowing [place] to participate in this patient's care.  [name], MD [facility] Office: [phone] Pager: [phone]

## 2016-01-05 DIAGNOSIS — N250 Renal osteodystrophy: Secondary | ICD-10-CM | POA: Diagnosis not present

## 2016-01-05 DIAGNOSIS — N186 End stage renal disease: Secondary | ICD-10-CM | POA: Diagnosis not present

## 2016-01-05 DIAGNOSIS — B9689 Other specified bacterial agents as the cause of diseases classified elsewhere: Secondary | ICD-10-CM | POA: Diagnosis not present

## 2016-01-05 DIAGNOSIS — Z992 Dependence on renal dialysis: Secondary | ICD-10-CM | POA: Diagnosis not present

## 2016-01-05 DIAGNOSIS — L02213 Cutaneous abscess of chest wall: Secondary | ICD-10-CM | POA: Diagnosis not present

## 2016-01-05 DIAGNOSIS — E162 Hypoglycemia, unspecified: Secondary | ICD-10-CM | POA: Diagnosis not present

## 2016-01-05 NOTE — Progress Notes (Signed)
This encounter was created in error - please disregard.

## 2016-01-06 DIAGNOSIS — E889 Metabolic disorder, unspecified: Secondary | ICD-10-CM | POA: Insufficient documentation

## 2016-01-06 DIAGNOSIS — M9080 Osteopathy in diseases classified elsewhere, unspecified site: Secondary | ICD-10-CM | POA: Insufficient documentation

## 2016-01-07 DIAGNOSIS — E162 Hypoglycemia, unspecified: Secondary | ICD-10-CM | POA: Diagnosis not present

## 2016-01-07 DIAGNOSIS — N250 Renal osteodystrophy: Secondary | ICD-10-CM | POA: Diagnosis not present

## 2016-01-07 DIAGNOSIS — B9689 Other specified bacterial agents as the cause of diseases classified elsewhere: Secondary | ICD-10-CM | POA: Diagnosis not present

## 2016-01-07 DIAGNOSIS — Z992 Dependence on renal dialysis: Secondary | ICD-10-CM | POA: Diagnosis not present

## 2016-01-07 DIAGNOSIS — L02213 Cutaneous abscess of chest wall: Secondary | ICD-10-CM | POA: Diagnosis not present

## 2016-01-07 DIAGNOSIS — N186 End stage renal disease: Secondary | ICD-10-CM | POA: Diagnosis not present

## 2016-01-10 DIAGNOSIS — N250 Renal osteodystrophy: Secondary | ICD-10-CM | POA: Diagnosis not present

## 2016-01-10 DIAGNOSIS — L02213 Cutaneous abscess of chest wall: Secondary | ICD-10-CM | POA: Diagnosis not present

## 2016-01-10 DIAGNOSIS — Z992 Dependence on renal dialysis: Secondary | ICD-10-CM | POA: Diagnosis not present

## 2016-01-10 DIAGNOSIS — N186 End stage renal disease: Secondary | ICD-10-CM | POA: Diagnosis not present

## 2016-01-10 DIAGNOSIS — E162 Hypoglycemia, unspecified: Secondary | ICD-10-CM | POA: Diagnosis not present

## 2016-01-10 DIAGNOSIS — B9689 Other specified bacterial agents as the cause of diseases classified elsewhere: Secondary | ICD-10-CM | POA: Diagnosis not present

## 2016-01-13 DIAGNOSIS — N186 End stage renal disease: Secondary | ICD-10-CM | POA: Diagnosis not present

## 2016-01-13 DIAGNOSIS — E162 Hypoglycemia, unspecified: Secondary | ICD-10-CM | POA: Diagnosis not present

## 2016-01-13 DIAGNOSIS — L02213 Cutaneous abscess of chest wall: Secondary | ICD-10-CM | POA: Diagnosis not present

## 2016-01-13 DIAGNOSIS — B9689 Other specified bacterial agents as the cause of diseases classified elsewhere: Secondary | ICD-10-CM | POA: Diagnosis not present

## 2016-01-13 DIAGNOSIS — Z992 Dependence on renal dialysis: Secondary | ICD-10-CM | POA: Diagnosis not present

## 2016-01-13 DIAGNOSIS — N250 Renal osteodystrophy: Secondary | ICD-10-CM | POA: Diagnosis not present

## 2016-01-14 DIAGNOSIS — N250 Renal osteodystrophy: Secondary | ICD-10-CM | POA: Diagnosis not present

## 2016-01-14 DIAGNOSIS — B9689 Other specified bacterial agents as the cause of diseases classified elsewhere: Secondary | ICD-10-CM | POA: Diagnosis not present

## 2016-01-14 DIAGNOSIS — L02213 Cutaneous abscess of chest wall: Secondary | ICD-10-CM | POA: Diagnosis not present

## 2016-01-14 DIAGNOSIS — N186 End stage renal disease: Secondary | ICD-10-CM | POA: Diagnosis not present

## 2016-01-14 DIAGNOSIS — E162 Hypoglycemia, unspecified: Secondary | ICD-10-CM | POA: Diagnosis not present

## 2016-01-14 DIAGNOSIS — Z992 Dependence on renal dialysis: Secondary | ICD-10-CM | POA: Diagnosis not present

## 2016-01-17 DIAGNOSIS — B9689 Other specified bacterial agents as the cause of diseases classified elsewhere: Secondary | ICD-10-CM | POA: Diagnosis not present

## 2016-01-17 DIAGNOSIS — L02213 Cutaneous abscess of chest wall: Secondary | ICD-10-CM | POA: Diagnosis not present

## 2016-01-17 DIAGNOSIS — N250 Renal osteodystrophy: Secondary | ICD-10-CM | POA: Diagnosis not present

## 2016-01-17 DIAGNOSIS — E162 Hypoglycemia, unspecified: Secondary | ICD-10-CM | POA: Diagnosis not present

## 2016-01-17 DIAGNOSIS — N186 End stage renal disease: Secondary | ICD-10-CM | POA: Diagnosis not present

## 2016-01-17 DIAGNOSIS — Z992 Dependence on renal dialysis: Secondary | ICD-10-CM | POA: Diagnosis not present

## 2016-01-18 DIAGNOSIS — J45909 Unspecified asthma, uncomplicated: Secondary | ICD-10-CM | POA: Insufficient documentation

## 2016-01-18 DIAGNOSIS — Z8659 Personal history of other mental and behavioral disorders: Secondary | ICD-10-CM | POA: Insufficient documentation

## 2016-01-19 DIAGNOSIS — Z992 Dependence on renal dialysis: Secondary | ICD-10-CM | POA: Diagnosis not present

## 2016-01-19 DIAGNOSIS — E162 Hypoglycemia, unspecified: Secondary | ICD-10-CM | POA: Diagnosis not present

## 2016-01-19 DIAGNOSIS — B9689 Other specified bacterial agents as the cause of diseases classified elsewhere: Secondary | ICD-10-CM | POA: Diagnosis not present

## 2016-01-19 DIAGNOSIS — N186 End stage renal disease: Secondary | ICD-10-CM | POA: Diagnosis not present

## 2016-01-19 DIAGNOSIS — N250 Renal osteodystrophy: Secondary | ICD-10-CM | POA: Diagnosis not present

## 2016-01-19 DIAGNOSIS — L02213 Cutaneous abscess of chest wall: Secondary | ICD-10-CM | POA: Diagnosis not present

## 2016-01-21 DIAGNOSIS — N186 End stage renal disease: Secondary | ICD-10-CM | POA: Diagnosis not present

## 2016-01-21 DIAGNOSIS — Z992 Dependence on renal dialysis: Secondary | ICD-10-CM | POA: Diagnosis not present

## 2016-01-21 DIAGNOSIS — L02213 Cutaneous abscess of chest wall: Secondary | ICD-10-CM | POA: Diagnosis not present

## 2016-01-21 DIAGNOSIS — B9689 Other specified bacterial agents as the cause of diseases classified elsewhere: Secondary | ICD-10-CM | POA: Diagnosis not present

## 2016-01-21 DIAGNOSIS — E162 Hypoglycemia, unspecified: Secondary | ICD-10-CM | POA: Diagnosis not present

## 2016-01-21 DIAGNOSIS — N250 Renal osteodystrophy: Secondary | ICD-10-CM | POA: Diagnosis not present

## 2016-01-24 DIAGNOSIS — N186 End stage renal disease: Secondary | ICD-10-CM | POA: Diagnosis not present

## 2016-01-24 DIAGNOSIS — L02213 Cutaneous abscess of chest wall: Secondary | ICD-10-CM | POA: Diagnosis not present

## 2016-01-24 DIAGNOSIS — B9689 Other specified bacterial agents as the cause of diseases classified elsewhere: Secondary | ICD-10-CM | POA: Diagnosis not present

## 2016-01-24 DIAGNOSIS — Z992 Dependence on renal dialysis: Secondary | ICD-10-CM | POA: Diagnosis not present

## 2016-01-24 DIAGNOSIS — E162 Hypoglycemia, unspecified: Secondary | ICD-10-CM | POA: Diagnosis not present

## 2016-01-24 DIAGNOSIS — N250 Renal osteodystrophy: Secondary | ICD-10-CM | POA: Diagnosis not present

## 2016-01-26 DIAGNOSIS — B9689 Other specified bacterial agents as the cause of diseases classified elsewhere: Secondary | ICD-10-CM | POA: Diagnosis not present

## 2016-01-26 DIAGNOSIS — Z992 Dependence on renal dialysis: Secondary | ICD-10-CM | POA: Diagnosis not present

## 2016-01-26 DIAGNOSIS — N186 End stage renal disease: Secondary | ICD-10-CM | POA: Diagnosis not present

## 2016-01-26 DIAGNOSIS — E162 Hypoglycemia, unspecified: Secondary | ICD-10-CM | POA: Diagnosis not present

## 2016-01-26 DIAGNOSIS — L02213 Cutaneous abscess of chest wall: Secondary | ICD-10-CM | POA: Diagnosis not present

## 2016-01-26 DIAGNOSIS — N250 Renal osteodystrophy: Secondary | ICD-10-CM | POA: Diagnosis not present

## 2016-01-27 DIAGNOSIS — N186 End stage renal disease: Secondary | ICD-10-CM | POA: Diagnosis not present

## 2016-01-27 DIAGNOSIS — Z992 Dependence on renal dialysis: Secondary | ICD-10-CM | POA: Diagnosis not present

## 2016-01-28 DIAGNOSIS — E11649 Type 2 diabetes mellitus with hypoglycemia without coma: Secondary | ICD-10-CM | POA: Diagnosis not present

## 2016-01-28 DIAGNOSIS — D509 Iron deficiency anemia, unspecified: Secondary | ICD-10-CM | POA: Diagnosis not present

## 2016-01-28 DIAGNOSIS — N186 End stage renal disease: Secondary | ICD-10-CM | POA: Diagnosis not present

## 2016-01-28 DIAGNOSIS — Z794 Long term (current) use of insulin: Secondary | ICD-10-CM | POA: Diagnosis not present

## 2016-01-28 DIAGNOSIS — Z992 Dependence on renal dialysis: Secondary | ICD-10-CM | POA: Diagnosis not present

## 2016-01-28 DIAGNOSIS — N250 Renal osteodystrophy: Secondary | ICD-10-CM | POA: Diagnosis not present

## 2016-01-28 DIAGNOSIS — E162 Hypoglycemia, unspecified: Secondary | ICD-10-CM | POA: Diagnosis not present

## 2016-01-28 DIAGNOSIS — Z23 Encounter for immunization: Secondary | ICD-10-CM | POA: Diagnosis not present

## 2016-01-31 DIAGNOSIS — Z23 Encounter for immunization: Secondary | ICD-10-CM | POA: Diagnosis not present

## 2016-01-31 DIAGNOSIS — Z992 Dependence on renal dialysis: Secondary | ICD-10-CM | POA: Diagnosis not present

## 2016-01-31 DIAGNOSIS — N186 End stage renal disease: Secondary | ICD-10-CM | POA: Diagnosis not present

## 2016-01-31 DIAGNOSIS — D509 Iron deficiency anemia, unspecified: Secondary | ICD-10-CM | POA: Diagnosis not present

## 2016-01-31 DIAGNOSIS — N250 Renal osteodystrophy: Secondary | ICD-10-CM | POA: Diagnosis not present

## 2016-01-31 DIAGNOSIS — E162 Hypoglycemia, unspecified: Secondary | ICD-10-CM | POA: Diagnosis not present

## 2016-02-02 DIAGNOSIS — N186 End stage renal disease: Secondary | ICD-10-CM | POA: Diagnosis not present

## 2016-02-02 DIAGNOSIS — E162 Hypoglycemia, unspecified: Secondary | ICD-10-CM | POA: Diagnosis not present

## 2016-02-02 DIAGNOSIS — N250 Renal osteodystrophy: Secondary | ICD-10-CM | POA: Diagnosis not present

## 2016-02-02 DIAGNOSIS — D509 Iron deficiency anemia, unspecified: Secondary | ICD-10-CM | POA: Diagnosis not present

## 2016-02-02 DIAGNOSIS — Z992 Dependence on renal dialysis: Secondary | ICD-10-CM | POA: Diagnosis not present

## 2016-02-02 DIAGNOSIS — Z23 Encounter for immunization: Secondary | ICD-10-CM | POA: Diagnosis not present

## 2016-02-07 DIAGNOSIS — D509 Iron deficiency anemia, unspecified: Secondary | ICD-10-CM | POA: Diagnosis not present

## 2016-02-07 DIAGNOSIS — Z992 Dependence on renal dialysis: Secondary | ICD-10-CM | POA: Diagnosis not present

## 2016-02-07 DIAGNOSIS — Z23 Encounter for immunization: Secondary | ICD-10-CM | POA: Diagnosis not present

## 2016-02-07 DIAGNOSIS — E162 Hypoglycemia, unspecified: Secondary | ICD-10-CM | POA: Diagnosis not present

## 2016-02-07 DIAGNOSIS — N250 Renal osteodystrophy: Secondary | ICD-10-CM | POA: Diagnosis not present

## 2016-02-07 DIAGNOSIS — N186 End stage renal disease: Secondary | ICD-10-CM | POA: Diagnosis not present

## 2016-02-09 DIAGNOSIS — Z23 Encounter for immunization: Secondary | ICD-10-CM | POA: Diagnosis not present

## 2016-02-09 DIAGNOSIS — Z992 Dependence on renal dialysis: Secondary | ICD-10-CM | POA: Diagnosis not present

## 2016-02-09 DIAGNOSIS — N186 End stage renal disease: Secondary | ICD-10-CM | POA: Diagnosis not present

## 2016-02-09 DIAGNOSIS — D509 Iron deficiency anemia, unspecified: Secondary | ICD-10-CM | POA: Diagnosis not present

## 2016-02-09 DIAGNOSIS — N250 Renal osteodystrophy: Secondary | ICD-10-CM | POA: Diagnosis not present

## 2016-02-09 DIAGNOSIS — E162 Hypoglycemia, unspecified: Secondary | ICD-10-CM | POA: Diagnosis not present

## 2016-02-11 DIAGNOSIS — Z23 Encounter for immunization: Secondary | ICD-10-CM | POA: Diagnosis not present

## 2016-02-11 DIAGNOSIS — N186 End stage renal disease: Secondary | ICD-10-CM | POA: Diagnosis not present

## 2016-02-11 DIAGNOSIS — D509 Iron deficiency anemia, unspecified: Secondary | ICD-10-CM | POA: Diagnosis not present

## 2016-02-11 DIAGNOSIS — Z992 Dependence on renal dialysis: Secondary | ICD-10-CM | POA: Diagnosis not present

## 2016-02-11 DIAGNOSIS — E162 Hypoglycemia, unspecified: Secondary | ICD-10-CM | POA: Diagnosis not present

## 2016-02-11 DIAGNOSIS — N250 Renal osteodystrophy: Secondary | ICD-10-CM | POA: Diagnosis not present

## 2016-02-15 DIAGNOSIS — E162 Hypoglycemia, unspecified: Secondary | ICD-10-CM | POA: Diagnosis not present

## 2016-02-15 DIAGNOSIS — D509 Iron deficiency anemia, unspecified: Secondary | ICD-10-CM | POA: Diagnosis not present

## 2016-02-15 DIAGNOSIS — Z23 Encounter for immunization: Secondary | ICD-10-CM | POA: Diagnosis not present

## 2016-02-15 DIAGNOSIS — Z992 Dependence on renal dialysis: Secondary | ICD-10-CM | POA: Diagnosis not present

## 2016-02-15 DIAGNOSIS — N186 End stage renal disease: Secondary | ICD-10-CM | POA: Diagnosis not present

## 2016-02-15 DIAGNOSIS — N250 Renal osteodystrophy: Secondary | ICD-10-CM | POA: Diagnosis not present

## 2016-02-18 DIAGNOSIS — Z992 Dependence on renal dialysis: Secondary | ICD-10-CM | POA: Diagnosis not present

## 2016-02-18 DIAGNOSIS — Z23 Encounter for immunization: Secondary | ICD-10-CM | POA: Diagnosis not present

## 2016-02-18 DIAGNOSIS — N250 Renal osteodystrophy: Secondary | ICD-10-CM | POA: Diagnosis not present

## 2016-02-18 DIAGNOSIS — E162 Hypoglycemia, unspecified: Secondary | ICD-10-CM | POA: Diagnosis not present

## 2016-02-18 DIAGNOSIS — D509 Iron deficiency anemia, unspecified: Secondary | ICD-10-CM | POA: Diagnosis not present

## 2016-02-18 DIAGNOSIS — N186 End stage renal disease: Secondary | ICD-10-CM | POA: Diagnosis not present

## 2016-02-21 DIAGNOSIS — N250 Renal osteodystrophy: Secondary | ICD-10-CM | POA: Diagnosis not present

## 2016-02-21 DIAGNOSIS — D509 Iron deficiency anemia, unspecified: Secondary | ICD-10-CM | POA: Diagnosis not present

## 2016-02-21 DIAGNOSIS — Z23 Encounter for immunization: Secondary | ICD-10-CM | POA: Diagnosis not present

## 2016-02-21 DIAGNOSIS — Z992 Dependence on renal dialysis: Secondary | ICD-10-CM | POA: Diagnosis not present

## 2016-02-21 DIAGNOSIS — N186 End stage renal disease: Secondary | ICD-10-CM | POA: Diagnosis not present

## 2016-02-21 DIAGNOSIS — E162 Hypoglycemia, unspecified: Secondary | ICD-10-CM | POA: Diagnosis not present

## 2016-02-23 DIAGNOSIS — Z992 Dependence on renal dialysis: Secondary | ICD-10-CM | POA: Diagnosis not present

## 2016-02-23 DIAGNOSIS — E162 Hypoglycemia, unspecified: Secondary | ICD-10-CM | POA: Diagnosis not present

## 2016-02-23 DIAGNOSIS — N250 Renal osteodystrophy: Secondary | ICD-10-CM | POA: Diagnosis not present

## 2016-02-23 DIAGNOSIS — N186 End stage renal disease: Secondary | ICD-10-CM | POA: Diagnosis not present

## 2016-02-23 DIAGNOSIS — D509 Iron deficiency anemia, unspecified: Secondary | ICD-10-CM | POA: Diagnosis not present

## 2016-02-23 DIAGNOSIS — Z23 Encounter for immunization: Secondary | ICD-10-CM | POA: Diagnosis not present

## 2016-02-25 DIAGNOSIS — N186 End stage renal disease: Secondary | ICD-10-CM | POA: Diagnosis not present

## 2016-02-25 DIAGNOSIS — Z992 Dependence on renal dialysis: Secondary | ICD-10-CM | POA: Diagnosis not present

## 2016-02-25 DIAGNOSIS — E162 Hypoglycemia, unspecified: Secondary | ICD-10-CM | POA: Diagnosis not present

## 2016-02-25 DIAGNOSIS — N250 Renal osteodystrophy: Secondary | ICD-10-CM | POA: Diagnosis not present

## 2016-02-25 DIAGNOSIS — Z23 Encounter for immunization: Secondary | ICD-10-CM | POA: Diagnosis not present

## 2016-02-25 DIAGNOSIS — D509 Iron deficiency anemia, unspecified: Secondary | ICD-10-CM | POA: Diagnosis not present

## 2016-02-27 DIAGNOSIS — Z992 Dependence on renal dialysis: Secondary | ICD-10-CM | POA: Diagnosis not present

## 2016-02-27 DIAGNOSIS — N186 End stage renal disease: Secondary | ICD-10-CM | POA: Diagnosis not present

## 2016-02-29 DIAGNOSIS — Z992 Dependence on renal dialysis: Secondary | ICD-10-CM | POA: Diagnosis not present

## 2016-02-29 DIAGNOSIS — Z794 Long term (current) use of insulin: Secondary | ICD-10-CM | POA: Diagnosis not present

## 2016-02-29 DIAGNOSIS — N250 Renal osteodystrophy: Secondary | ICD-10-CM | POA: Diagnosis not present

## 2016-02-29 DIAGNOSIS — E162 Hypoglycemia, unspecified: Secondary | ICD-10-CM | POA: Diagnosis not present

## 2016-02-29 DIAGNOSIS — N186 End stage renal disease: Secondary | ICD-10-CM | POA: Diagnosis not present

## 2016-02-29 DIAGNOSIS — E11649 Type 2 diabetes mellitus with hypoglycemia without coma: Secondary | ICD-10-CM | POA: Diagnosis not present

## 2016-02-29 DIAGNOSIS — D509 Iron deficiency anemia, unspecified: Secondary | ICD-10-CM | POA: Diagnosis not present

## 2016-02-29 DIAGNOSIS — Z23 Encounter for immunization: Secondary | ICD-10-CM | POA: Diagnosis not present

## 2016-03-01 DIAGNOSIS — E162 Hypoglycemia, unspecified: Secondary | ICD-10-CM | POA: Diagnosis not present

## 2016-03-01 DIAGNOSIS — N250 Renal osteodystrophy: Secondary | ICD-10-CM | POA: Diagnosis not present

## 2016-03-01 DIAGNOSIS — D509 Iron deficiency anemia, unspecified: Secondary | ICD-10-CM | POA: Diagnosis not present

## 2016-03-01 DIAGNOSIS — Z992 Dependence on renal dialysis: Secondary | ICD-10-CM | POA: Diagnosis not present

## 2016-03-01 DIAGNOSIS — Z23 Encounter for immunization: Secondary | ICD-10-CM | POA: Diagnosis not present

## 2016-03-01 DIAGNOSIS — N186 End stage renal disease: Secondary | ICD-10-CM | POA: Diagnosis not present

## 2016-03-03 DIAGNOSIS — D509 Iron deficiency anemia, unspecified: Secondary | ICD-10-CM | POA: Diagnosis not present

## 2016-03-03 DIAGNOSIS — Z992 Dependence on renal dialysis: Secondary | ICD-10-CM | POA: Diagnosis not present

## 2016-03-03 DIAGNOSIS — Z23 Encounter for immunization: Secondary | ICD-10-CM | POA: Diagnosis not present

## 2016-03-03 DIAGNOSIS — N186 End stage renal disease: Secondary | ICD-10-CM | POA: Diagnosis not present

## 2016-03-03 DIAGNOSIS — E162 Hypoglycemia, unspecified: Secondary | ICD-10-CM | POA: Diagnosis not present

## 2016-03-03 DIAGNOSIS — N250 Renal osteodystrophy: Secondary | ICD-10-CM | POA: Diagnosis not present

## 2016-03-06 DIAGNOSIS — Z23 Encounter for immunization: Secondary | ICD-10-CM | POA: Diagnosis not present

## 2016-03-06 DIAGNOSIS — N186 End stage renal disease: Secondary | ICD-10-CM | POA: Diagnosis not present

## 2016-03-06 DIAGNOSIS — Z992 Dependence on renal dialysis: Secondary | ICD-10-CM | POA: Diagnosis not present

## 2016-03-06 DIAGNOSIS — N250 Renal osteodystrophy: Secondary | ICD-10-CM | POA: Diagnosis not present

## 2016-03-06 DIAGNOSIS — D509 Iron deficiency anemia, unspecified: Secondary | ICD-10-CM | POA: Diagnosis not present

## 2016-03-06 DIAGNOSIS — E162 Hypoglycemia, unspecified: Secondary | ICD-10-CM | POA: Diagnosis not present

## 2016-03-08 DIAGNOSIS — D509 Iron deficiency anemia, unspecified: Secondary | ICD-10-CM | POA: Diagnosis not present

## 2016-03-08 DIAGNOSIS — N186 End stage renal disease: Secondary | ICD-10-CM | POA: Diagnosis not present

## 2016-03-08 DIAGNOSIS — N250 Renal osteodystrophy: Secondary | ICD-10-CM | POA: Diagnosis not present

## 2016-03-08 DIAGNOSIS — Z23 Encounter for immunization: Secondary | ICD-10-CM | POA: Diagnosis not present

## 2016-03-08 DIAGNOSIS — Z992 Dependence on renal dialysis: Secondary | ICD-10-CM | POA: Diagnosis not present

## 2016-03-08 DIAGNOSIS — E162 Hypoglycemia, unspecified: Secondary | ICD-10-CM | POA: Diagnosis not present

## 2016-03-10 DIAGNOSIS — N186 End stage renal disease: Secondary | ICD-10-CM | POA: Diagnosis not present

## 2016-03-10 DIAGNOSIS — E162 Hypoglycemia, unspecified: Secondary | ICD-10-CM | POA: Diagnosis not present

## 2016-03-10 DIAGNOSIS — Z992 Dependence on renal dialysis: Secondary | ICD-10-CM | POA: Diagnosis not present

## 2016-03-10 DIAGNOSIS — Z23 Encounter for immunization: Secondary | ICD-10-CM | POA: Diagnosis not present

## 2016-03-10 DIAGNOSIS — N250 Renal osteodystrophy: Secondary | ICD-10-CM | POA: Diagnosis not present

## 2016-03-10 DIAGNOSIS — D509 Iron deficiency anemia, unspecified: Secondary | ICD-10-CM | POA: Diagnosis not present

## 2016-03-13 DIAGNOSIS — D509 Iron deficiency anemia, unspecified: Secondary | ICD-10-CM | POA: Diagnosis not present

## 2016-03-13 DIAGNOSIS — Z992 Dependence on renal dialysis: Secondary | ICD-10-CM | POA: Diagnosis not present

## 2016-03-13 DIAGNOSIS — Z23 Encounter for immunization: Secondary | ICD-10-CM | POA: Diagnosis not present

## 2016-03-13 DIAGNOSIS — E162 Hypoglycemia, unspecified: Secondary | ICD-10-CM | POA: Diagnosis not present

## 2016-03-13 DIAGNOSIS — N186 End stage renal disease: Secondary | ICD-10-CM | POA: Diagnosis not present

## 2016-03-13 DIAGNOSIS — N250 Renal osteodystrophy: Secondary | ICD-10-CM | POA: Diagnosis not present

## 2016-03-14 ENCOUNTER — Ambulatory Visit: Payer: Medicaid Other | Admitting: Family Medicine

## 2016-03-14 DIAGNOSIS — H35371 Puckering of macula, right eye: Secondary | ICD-10-CM | POA: Diagnosis not present

## 2016-03-14 DIAGNOSIS — H4051X3 Glaucoma secondary to other eye disorders, right eye, severe stage: Secondary | ICD-10-CM | POA: Diagnosis not present

## 2016-03-14 DIAGNOSIS — E113511 Type 2 diabetes mellitus with proliferative diabetic retinopathy with macular edema, right eye: Secondary | ICD-10-CM | POA: Diagnosis not present

## 2016-03-14 DIAGNOSIS — H211X1 Other vascular disorders of iris and ciliary body, right eye: Secondary | ICD-10-CM | POA: Diagnosis not present

## 2016-03-14 LAB — HM DIABETES EYE EXAM

## 2016-03-15 DIAGNOSIS — Z79899 Other long term (current) drug therapy: Secondary | ICD-10-CM | POA: Diagnosis not present

## 2016-03-15 DIAGNOSIS — R404 Transient alteration of awareness: Secondary | ICD-10-CM | POA: Diagnosis not present

## 2016-03-15 DIAGNOSIS — E1122 Type 2 diabetes mellitus with diabetic chronic kidney disease: Secondary | ICD-10-CM | POA: Diagnosis not present

## 2016-03-15 DIAGNOSIS — D509 Iron deficiency anemia, unspecified: Secondary | ICD-10-CM | POA: Diagnosis not present

## 2016-03-15 DIAGNOSIS — R55 Syncope and collapse: Secondary | ICD-10-CM | POA: Diagnosis not present

## 2016-03-15 DIAGNOSIS — N250 Renal osteodystrophy: Secondary | ICD-10-CM | POA: Diagnosis not present

## 2016-03-15 DIAGNOSIS — Z794 Long term (current) use of insulin: Secondary | ICD-10-CM | POA: Diagnosis not present

## 2016-03-15 DIAGNOSIS — Z7982 Long term (current) use of aspirin: Secondary | ICD-10-CM | POA: Diagnosis not present

## 2016-03-15 DIAGNOSIS — Z992 Dependence on renal dialysis: Secondary | ICD-10-CM | POA: Diagnosis not present

## 2016-03-15 DIAGNOSIS — R030 Elevated blood-pressure reading, without diagnosis of hypertension: Secondary | ICD-10-CM | POA: Diagnosis not present

## 2016-03-15 DIAGNOSIS — N186 End stage renal disease: Secondary | ICD-10-CM | POA: Diagnosis not present

## 2016-03-15 DIAGNOSIS — R4182 Altered mental status, unspecified: Secondary | ICD-10-CM | POA: Diagnosis not present

## 2016-03-15 DIAGNOSIS — E162 Hypoglycemia, unspecified: Secondary | ICD-10-CM | POA: Diagnosis not present

## 2016-03-15 DIAGNOSIS — I120 Hypertensive chronic kidney disease with stage 5 chronic kidney disease or end stage renal disease: Secondary | ICD-10-CM | POA: Diagnosis not present

## 2016-03-15 DIAGNOSIS — Z23 Encounter for immunization: Secondary | ICD-10-CM | POA: Diagnosis not present

## 2016-03-16 ENCOUNTER — Ambulatory Visit: Payer: Medicaid Other | Admitting: Family Medicine

## 2016-03-16 DIAGNOSIS — R55 Syncope and collapse: Secondary | ICD-10-CM | POA: Diagnosis not present

## 2016-03-16 DIAGNOSIS — R0902 Hypoxemia: Secondary | ICD-10-CM | POA: Diagnosis not present

## 2016-03-16 DIAGNOSIS — N189 Chronic kidney disease, unspecified: Secondary | ICD-10-CM | POA: Diagnosis not present

## 2016-03-19 DIAGNOSIS — E162 Hypoglycemia, unspecified: Secondary | ICD-10-CM | POA: Diagnosis not present

## 2016-03-19 DIAGNOSIS — Z992 Dependence on renal dialysis: Secondary | ICD-10-CM | POA: Diagnosis not present

## 2016-03-19 DIAGNOSIS — D509 Iron deficiency anemia, unspecified: Secondary | ICD-10-CM | POA: Diagnosis not present

## 2016-03-19 DIAGNOSIS — N186 End stage renal disease: Secondary | ICD-10-CM | POA: Diagnosis not present

## 2016-03-19 DIAGNOSIS — Z23 Encounter for immunization: Secondary | ICD-10-CM | POA: Diagnosis not present

## 2016-03-19 DIAGNOSIS — N250 Renal osteodystrophy: Secondary | ICD-10-CM | POA: Diagnosis not present

## 2016-03-20 DIAGNOSIS — N186 End stage renal disease: Secondary | ICD-10-CM | POA: Diagnosis not present

## 2016-03-20 DIAGNOSIS — N250 Renal osteodystrophy: Secondary | ICD-10-CM | POA: Diagnosis not present

## 2016-03-20 DIAGNOSIS — Z23 Encounter for immunization: Secondary | ICD-10-CM | POA: Diagnosis not present

## 2016-03-20 DIAGNOSIS — E162 Hypoglycemia, unspecified: Secondary | ICD-10-CM | POA: Diagnosis not present

## 2016-03-20 DIAGNOSIS — Z992 Dependence on renal dialysis: Secondary | ICD-10-CM | POA: Diagnosis not present

## 2016-03-20 DIAGNOSIS — D509 Iron deficiency anemia, unspecified: Secondary | ICD-10-CM | POA: Diagnosis not present

## 2016-03-22 DIAGNOSIS — D509 Iron deficiency anemia, unspecified: Secondary | ICD-10-CM | POA: Diagnosis not present

## 2016-03-22 DIAGNOSIS — E162 Hypoglycemia, unspecified: Secondary | ICD-10-CM | POA: Diagnosis not present

## 2016-03-22 DIAGNOSIS — N250 Renal osteodystrophy: Secondary | ICD-10-CM | POA: Diagnosis not present

## 2016-03-22 DIAGNOSIS — Z992 Dependence on renal dialysis: Secondary | ICD-10-CM | POA: Diagnosis not present

## 2016-03-22 DIAGNOSIS — Z23 Encounter for immunization: Secondary | ICD-10-CM | POA: Diagnosis not present

## 2016-03-22 DIAGNOSIS — N186 End stage renal disease: Secondary | ICD-10-CM | POA: Diagnosis not present

## 2016-03-24 DIAGNOSIS — Z23 Encounter for immunization: Secondary | ICD-10-CM | POA: Diagnosis not present

## 2016-03-24 DIAGNOSIS — Z992 Dependence on renal dialysis: Secondary | ICD-10-CM | POA: Diagnosis not present

## 2016-03-24 DIAGNOSIS — D509 Iron deficiency anemia, unspecified: Secondary | ICD-10-CM | POA: Diagnosis not present

## 2016-03-24 DIAGNOSIS — E162 Hypoglycemia, unspecified: Secondary | ICD-10-CM | POA: Diagnosis not present

## 2016-03-24 DIAGNOSIS — N186 End stage renal disease: Secondary | ICD-10-CM | POA: Diagnosis not present

## 2016-03-24 DIAGNOSIS — N250 Renal osteodystrophy: Secondary | ICD-10-CM | POA: Diagnosis not present

## 2016-03-27 DIAGNOSIS — E162 Hypoglycemia, unspecified: Secondary | ICD-10-CM | POA: Diagnosis not present

## 2016-03-27 DIAGNOSIS — N250 Renal osteodystrophy: Secondary | ICD-10-CM | POA: Diagnosis not present

## 2016-03-27 DIAGNOSIS — Z992 Dependence on renal dialysis: Secondary | ICD-10-CM | POA: Diagnosis not present

## 2016-03-27 DIAGNOSIS — N186 End stage renal disease: Secondary | ICD-10-CM | POA: Diagnosis not present

## 2016-03-27 DIAGNOSIS — Z23 Encounter for immunization: Secondary | ICD-10-CM | POA: Diagnosis not present

## 2016-03-27 DIAGNOSIS — D509 Iron deficiency anemia, unspecified: Secondary | ICD-10-CM | POA: Diagnosis not present

## 2016-03-29 DIAGNOSIS — N186 End stage renal disease: Secondary | ICD-10-CM | POA: Diagnosis not present

## 2016-03-29 DIAGNOSIS — D509 Iron deficiency anemia, unspecified: Secondary | ICD-10-CM | POA: Diagnosis not present

## 2016-03-29 DIAGNOSIS — Z992 Dependence on renal dialysis: Secondary | ICD-10-CM | POA: Diagnosis not present

## 2016-03-29 DIAGNOSIS — E162 Hypoglycemia, unspecified: Secondary | ICD-10-CM | POA: Diagnosis not present

## 2016-03-29 DIAGNOSIS — N250 Renal osteodystrophy: Secondary | ICD-10-CM | POA: Diagnosis not present

## 2016-03-29 DIAGNOSIS — Z23 Encounter for immunization: Secondary | ICD-10-CM | POA: Diagnosis not present

## 2016-03-31 DIAGNOSIS — Z992 Dependence on renal dialysis: Secondary | ICD-10-CM | POA: Diagnosis not present

## 2016-03-31 DIAGNOSIS — Z794 Long term (current) use of insulin: Secondary | ICD-10-CM | POA: Diagnosis not present

## 2016-03-31 DIAGNOSIS — D509 Iron deficiency anemia, unspecified: Secondary | ICD-10-CM | POA: Diagnosis not present

## 2016-03-31 DIAGNOSIS — E11649 Type 2 diabetes mellitus with hypoglycemia without coma: Secondary | ICD-10-CM | POA: Diagnosis not present

## 2016-03-31 DIAGNOSIS — E162 Hypoglycemia, unspecified: Secondary | ICD-10-CM | POA: Diagnosis not present

## 2016-03-31 DIAGNOSIS — Z23 Encounter for immunization: Secondary | ICD-10-CM | POA: Diagnosis not present

## 2016-03-31 DIAGNOSIS — N186 End stage renal disease: Secondary | ICD-10-CM | POA: Diagnosis not present

## 2016-04-04 ENCOUNTER — Ambulatory Visit: Payer: Medicaid Other | Admitting: Family Medicine

## 2016-04-05 DIAGNOSIS — D509 Iron deficiency anemia, unspecified: Secondary | ICD-10-CM | POA: Diagnosis not present

## 2016-04-05 DIAGNOSIS — Z23 Encounter for immunization: Secondary | ICD-10-CM | POA: Diagnosis not present

## 2016-04-05 DIAGNOSIS — Z992 Dependence on renal dialysis: Secondary | ICD-10-CM | POA: Diagnosis not present

## 2016-04-05 DIAGNOSIS — E162 Hypoglycemia, unspecified: Secondary | ICD-10-CM | POA: Diagnosis not present

## 2016-04-05 DIAGNOSIS — N186 End stage renal disease: Secondary | ICD-10-CM | POA: Diagnosis not present

## 2016-04-05 DIAGNOSIS — E11649 Type 2 diabetes mellitus with hypoglycemia without coma: Secondary | ICD-10-CM | POA: Diagnosis not present

## 2016-04-06 ENCOUNTER — Ambulatory Visit (INDEPENDENT_AMBULATORY_CARE_PROVIDER_SITE_OTHER): Payer: Medicare Other | Admitting: Family Medicine

## 2016-04-06 ENCOUNTER — Other Ambulatory Visit: Payer: Self-pay | Admitting: Family Medicine

## 2016-04-06 ENCOUNTER — Encounter: Payer: Self-pay | Admitting: Family Medicine

## 2016-04-06 VITALS — BP 171/85 | HR 62 | Temp 97.2°F | Ht 63.0 in | Wt 177.6 lb

## 2016-04-06 DIAGNOSIS — R197 Diarrhea, unspecified: Secondary | ICD-10-CM | POA: Diagnosis not present

## 2016-04-06 DIAGNOSIS — L299 Pruritus, unspecified: Secondary | ICD-10-CM

## 2016-04-06 DIAGNOSIS — E1142 Type 2 diabetes mellitus with diabetic polyneuropathy: Secondary | ICD-10-CM

## 2016-04-06 DIAGNOSIS — I5032 Chronic diastolic (congestive) heart failure: Secondary | ICD-10-CM | POA: Diagnosis not present

## 2016-04-06 DIAGNOSIS — I10 Essential (primary) hypertension: Secondary | ICD-10-CM | POA: Diagnosis not present

## 2016-04-06 DIAGNOSIS — R159 Full incontinence of feces: Secondary | ICD-10-CM | POA: Diagnosis not present

## 2016-04-06 DIAGNOSIS — Z794 Long term (current) use of insulin: Secondary | ICD-10-CM | POA: Diagnosis not present

## 2016-04-06 DIAGNOSIS — R2681 Unsteadiness on feet: Secondary | ICD-10-CM

## 2016-04-06 DIAGNOSIS — E11319 Type 2 diabetes mellitus with unspecified diabetic retinopathy without macular edema: Secondary | ICD-10-CM

## 2016-04-06 DIAGNOSIS — IMO0001 Reserved for inherently not codable concepts without codable children: Secondary | ICD-10-CM

## 2016-04-06 LAB — BAYER DCA HB A1C WAIVED: HB A1C: 7.6 % — AB (ref <7.0)

## 2016-04-06 MED ORDER — TRIAMCINOLONE ACETONIDE 0.1 % EX CREA: 1.0000 "application " | TOPICAL_CREAM | Freq: Two times a day (BID) | CUTANEOUS | 0 refills | Status: DC

## 2016-04-06 NOTE — Progress Notes (Signed)
HPI  Patient presents today here for follow-up chronic medical conditions, also to request some referrals.  Type 2 diabetes Good medication compliance Has not been checking blood sugars No hypoglycemia Taking 8 units of Humalog at most meals 15 units of Lantus once daily.  Diarrhea Continued, states that she has difficulty holding her bowels, she would like a referral to GI.  Patient is now on hemodialysis, she is doing much better on it.  Itching Very irritated with itching, bilateral upper arms and lower abdomen, no rash  She also requests prescription for a shower chair and a bedside toilet.  After our visit she also requests cardiology referral to establish for CHF.   PMH: Smoking status noted ROS: Per HPI  Objective: BP (!) 171/85   Pulse 62   Temp 97.2 F (36.2 C) (Oral)   Ht 5' 3"  (1.6 m)   Wt 177 lb 9.6 oz (80.6 kg)   BMI 31.46 kg/m  Gen: NAD, alert, cooperative with exam HEENT: NCAT CV: RRR, good S1/S2, no murmur Resp: CTABL, no wheezes, non-labored Ext: No edema, warm Neuro: Alert and oriented, No gross deficits  Diabetic Foot Exam - Simple   Simple Foot Form Visual Inspection No deformities, no ulcerations, no other skin breakdown bilaterally:  Yes Sensation Testing See comments:  Yes Pulse Check Posterior Tibialis and Dorsalis pulse intact bilaterally:  Yes Comments decreased sensation on the distal lateral foot bilaterally, some crusting of the medial nailfold on the great toe of the left foot- no signs of infection     Assessment and plan:  # Type 2 diabetes Reasonably well controlled, A1c 7.6 Continue current regimen Foot exam with neuropathy  # Diabetic neuropathy Referring to podiatry per her request, agree that this is appropriate  # Diarrhea, chronic, bowel incontinence Bedside toilet ordered, refer to GI per her request Some signs of IBS, however with difficulty holding her bowels and medical complexity I appreciate GIs  evaluation and treatment  # Hypertension Elevated today, however she reports normal blood pressures recently with nephrology. She is on dialysis so I feel confident this is volume dependent. Continue hydralazine and beta blocker No changes for now  Gait instability Stable, given Rx for bedside toilet and shower chair  CHF Chronic diastolic, no signs of volume overload, previously volume was mostly due to CKD- this is much improved on HD. Cardiology referral to establish care, her previous cardiologist is no longer in [place] per her report  Itching Previously I felt this was likely due to elevated BUN However after she started dialysis this has not improved Trial of triamcinolone cream, careful to avoid the underarms and groin    Orders Placed This Encounter  Procedures  . Bayer DCA Hb A1c Waived  . Ambulatory referral to Gastroenterology    Referral Priority:   Routine    Referral Type:   Consultation    Referral Reason:   Specialty Services Required    Number of Visits Requested:   1  . Ambulatory referral to Podiatry    Referral Priority:   Routine    Referral Type:   Consultation    Referral Reason:   Specialty Services Required    Requested Specialty:   Podiatry    Number of Visits Requested:   1  . Ambulatory referral to Cardiology    Referral Priority:   Routine    Referral Type:   Consultation    Referral Reason:   Specialty Services Required    Requested Specialty:  Cardiology    Number of Visits Requested:   1    Meds ordered this encounter  Medications  . triamcinolone cream (KENALOG) 0.1 %    Sig: Apply 1 application topically 2 (two) times daily.    Dispense:  453.6 g    Refill:  0    [name], MD [facility] 04/06/2016, 2:59 PM

## 2016-04-06 NOTE — Patient Instructions (Addendum)
Great to see you!  Come back in 3 months unless you need [place] sooner  Try kenalog cream for your itching, avoid the underarms and groin.

## 2016-04-07 DIAGNOSIS — Z992 Dependence on renal dialysis: Secondary | ICD-10-CM | POA: Diagnosis not present

## 2016-04-07 DIAGNOSIS — D509 Iron deficiency anemia, unspecified: Secondary | ICD-10-CM | POA: Diagnosis not present

## 2016-04-07 DIAGNOSIS — E162 Hypoglycemia, unspecified: Secondary | ICD-10-CM | POA: Diagnosis not present

## 2016-04-07 DIAGNOSIS — N186 End stage renal disease: Secondary | ICD-10-CM | POA: Diagnosis not present

## 2016-04-07 DIAGNOSIS — Z23 Encounter for immunization: Secondary | ICD-10-CM | POA: Diagnosis not present

## 2016-04-07 DIAGNOSIS — E11649 Type 2 diabetes mellitus with hypoglycemia without coma: Secondary | ICD-10-CM | POA: Diagnosis not present

## 2016-04-09 ENCOUNTER — Encounter: Payer: Self-pay | Admitting: Gastroenterology

## 2016-04-09 DIAGNOSIS — Z23 Encounter for immunization: Secondary | ICD-10-CM | POA: Diagnosis not present

## 2016-04-09 DIAGNOSIS — N186 End stage renal disease: Secondary | ICD-10-CM | POA: Diagnosis not present

## 2016-04-09 DIAGNOSIS — Z992 Dependence on renal dialysis: Secondary | ICD-10-CM | POA: Diagnosis not present

## 2016-04-09 DIAGNOSIS — E11649 Type 2 diabetes mellitus with hypoglycemia without coma: Secondary | ICD-10-CM | POA: Diagnosis not present

## 2016-04-09 DIAGNOSIS — D509 Iron deficiency anemia, unspecified: Secondary | ICD-10-CM | POA: Diagnosis not present

## 2016-04-09 DIAGNOSIS — E162 Hypoglycemia, unspecified: Secondary | ICD-10-CM | POA: Diagnosis not present

## 2016-04-10 ENCOUNTER — Telehealth: Payer: Self-pay | Admitting: Family Medicine

## 2016-04-10 NOTE — Telephone Encounter (Signed)
Needs to be seen, the areas I saw were not consistent with infection.   [name], MD [facility] 04/10/2016, 12:16 PM

## 2016-04-11 ENCOUNTER — Encounter: Payer: Self-pay | Admitting: Family Medicine

## 2016-04-11 ENCOUNTER — Ambulatory Visit (INDEPENDENT_AMBULATORY_CARE_PROVIDER_SITE_OTHER): Payer: Medicare Other | Admitting: Family Medicine

## 2016-04-11 ENCOUNTER — Ambulatory Visit: Payer: Medicare Other | Admitting: Family Medicine

## 2016-04-11 VITALS — BP 174/85 | HR 63 | Temp 97.4°F | Ht 63.0 in | Wt 177.2 lb

## 2016-04-11 DIAGNOSIS — R21 Rash and other nonspecific skin eruption: Secondary | ICD-10-CM | POA: Diagnosis not present

## 2016-04-11 MED ORDER — DOXYCYCLINE HYCLATE 100 MG PO TABS: 100.0000 mg | ORAL_TABLET | Freq: Two times a day (BID) | ORAL | 0 refills | Status: DC

## 2016-04-11 NOTE — Patient Instructions (Signed)
Great to see you!  I have Sent doxycyline for your rash, you will also hear from [place] about a dermatology appointment  Be sure to eat foot with the antibiotics

## 2016-04-11 NOTE — Progress Notes (Signed)
   HPI  Patient presents today with rash.  Patient states it's been present for 2-2-1/2 weeks. She's been using Kenalog cream which was prescribed last visit which only helps a little bit, she feels that she needs antibiotics.  She states that several of the papules have started oozing and having purulent discharge.  She denies fever, chills, sweats.  She has lesions on her chest, trunk, arms, and scalp.  This happened once before a few months ago and then resolved.  She would like to see a dermatologist for definitive diagnosis.  PMH: Smoking status noted ROS: Per HPI  Objective: BP (!) 174/85   Pulse 63   Temp 97.4 F (36.3 C) (Oral)   Ht 5' 3"  (1.6 m)   Wt 177 lb 3.2 oz (80.4 kg)   BMI 31.39 kg/m  Gen: NAD, alert, cooperative with exam HEENT: NCAT CV: RRR, good S1/S2, no murmur Resp: CTABL, no wheezes, non-labored Ext: No edema, warm Neuro: Alert and oriented, No gross deficits  Skin:  50-75 papules scattered on the trunk, arms, and scalp, some with crusting at approximately half are on erythematous bases. On her left anterior chest there is one with an erythematous base and small amount of purulent discharge, also another on her left posterior arm with a small amount of purulent discharge No tenderness to palpation, warmth, fluctuance.    Assessment and plan:  # Rash Considering patient's concerns I think it is most prudent to go and cover with doxycycline Unclear etiology, consider infectious etiology, vasculitis, arthropod bites. Characteristic of arthropod bites as well with distribution only on the trunk, her legs are completely clear. Return to clinic with any concerns, refer to dermatology for their opinion  Refilled Celexa as well.    Orders Placed This Encounter  Procedures  . Ambulatory referral to Dermatology    Referral Priority:   Routine    Referral Type:   Consultation    Referral Reason:   Specialty Services Required    Requested  Specialty:   Dermatology    Number of Visits Requested:   1    Meds ordered this encounter  Medications  . doxycycline (VIBRA-TABS) 100 MG tablet    Sig: Take 1 tablet (100 mg total) by mouth 2 (two) times daily. 1 po bid    Dispense:  20 tablet    Refill:  0    [name], MD [facility] 04/11/2016, 4:23 PM

## 2016-04-12 DIAGNOSIS — Z992 Dependence on renal dialysis: Secondary | ICD-10-CM | POA: Diagnosis not present

## 2016-04-12 DIAGNOSIS — E162 Hypoglycemia, unspecified: Secondary | ICD-10-CM | POA: Diagnosis not present

## 2016-04-12 DIAGNOSIS — N186 End stage renal disease: Secondary | ICD-10-CM | POA: Diagnosis not present

## 2016-04-12 DIAGNOSIS — E11649 Type 2 diabetes mellitus with hypoglycemia without coma: Secondary | ICD-10-CM | POA: Diagnosis not present

## 2016-04-12 DIAGNOSIS — Z23 Encounter for immunization: Secondary | ICD-10-CM | POA: Diagnosis not present

## 2016-04-12 DIAGNOSIS — D509 Iron deficiency anemia, unspecified: Secondary | ICD-10-CM | POA: Diagnosis not present

## 2016-04-14 DIAGNOSIS — N186 End stage renal disease: Secondary | ICD-10-CM | POA: Diagnosis not present

## 2016-04-14 DIAGNOSIS — Z23 Encounter for immunization: Secondary | ICD-10-CM | POA: Diagnosis not present

## 2016-04-14 DIAGNOSIS — E162 Hypoglycemia, unspecified: Secondary | ICD-10-CM | POA: Diagnosis not present

## 2016-04-14 DIAGNOSIS — Z992 Dependence on renal dialysis: Secondary | ICD-10-CM | POA: Diagnosis not present

## 2016-04-14 DIAGNOSIS — E11649 Type 2 diabetes mellitus with hypoglycemia without coma: Secondary | ICD-10-CM | POA: Diagnosis not present

## 2016-04-14 DIAGNOSIS — D509 Iron deficiency anemia, unspecified: Secondary | ICD-10-CM | POA: Diagnosis not present

## 2016-04-17 DIAGNOSIS — E11649 Type 2 diabetes mellitus with hypoglycemia without coma: Secondary | ICD-10-CM | POA: Diagnosis not present

## 2016-04-17 DIAGNOSIS — Z23 Encounter for immunization: Secondary | ICD-10-CM | POA: Diagnosis not present

## 2016-04-17 DIAGNOSIS — E162 Hypoglycemia, unspecified: Secondary | ICD-10-CM | POA: Diagnosis not present

## 2016-04-17 DIAGNOSIS — D509 Iron deficiency anemia, unspecified: Secondary | ICD-10-CM | POA: Diagnosis not present

## 2016-04-17 DIAGNOSIS — Z992 Dependence on renal dialysis: Secondary | ICD-10-CM | POA: Diagnosis not present

## 2016-04-17 DIAGNOSIS — N186 End stage renal disease: Secondary | ICD-10-CM | POA: Diagnosis not present

## 2016-04-19 ENCOUNTER — Other Ambulatory Visit: Payer: Self-pay | Admitting: Family Medicine

## 2016-04-19 DIAGNOSIS — E11649 Type 2 diabetes mellitus with hypoglycemia without coma: Secondary | ICD-10-CM | POA: Diagnosis not present

## 2016-04-19 DIAGNOSIS — N186 End stage renal disease: Secondary | ICD-10-CM | POA: Diagnosis not present

## 2016-04-19 DIAGNOSIS — Z992 Dependence on renal dialysis: Secondary | ICD-10-CM | POA: Diagnosis not present

## 2016-04-19 DIAGNOSIS — Z23 Encounter for immunization: Secondary | ICD-10-CM | POA: Diagnosis not present

## 2016-04-19 DIAGNOSIS — D509 Iron deficiency anemia, unspecified: Secondary | ICD-10-CM | POA: Diagnosis not present

## 2016-04-19 DIAGNOSIS — E162 Hypoglycemia, unspecified: Secondary | ICD-10-CM | POA: Diagnosis not present

## 2016-04-20 ENCOUNTER — Other Ambulatory Visit: Payer: Self-pay

## 2016-04-20 MED ORDER — CITALOPRAM HYDROBROMIDE 20 MG PO TABS: 20.0000 mg | ORAL_TABLET | Freq: Every day | ORAL | 0 refills | Status: DC

## 2016-04-24 DIAGNOSIS — E11649 Type 2 diabetes mellitus with hypoglycemia without coma: Secondary | ICD-10-CM | POA: Diagnosis not present

## 2016-04-24 DIAGNOSIS — Z23 Encounter for immunization: Secondary | ICD-10-CM | POA: Diagnosis not present

## 2016-04-24 DIAGNOSIS — E162 Hypoglycemia, unspecified: Secondary | ICD-10-CM | POA: Diagnosis not present

## 2016-04-24 DIAGNOSIS — D509 Iron deficiency anemia, unspecified: Secondary | ICD-10-CM | POA: Diagnosis not present

## 2016-04-24 DIAGNOSIS — Z992 Dependence on renal dialysis: Secondary | ICD-10-CM | POA: Diagnosis not present

## 2016-04-24 DIAGNOSIS — Z794 Long term (current) use of insulin: Secondary | ICD-10-CM | POA: Diagnosis not present

## 2016-04-24 DIAGNOSIS — N186 End stage renal disease: Secondary | ICD-10-CM | POA: Diagnosis not present

## 2016-04-24 DIAGNOSIS — E119 Type 2 diabetes mellitus without complications: Secondary | ICD-10-CM | POA: Diagnosis not present

## 2016-04-26 DIAGNOSIS — E162 Hypoglycemia, unspecified: Secondary | ICD-10-CM | POA: Diagnosis not present

## 2016-04-26 DIAGNOSIS — E11649 Type 2 diabetes mellitus with hypoglycemia without coma: Secondary | ICD-10-CM | POA: Diagnosis not present

## 2016-04-26 DIAGNOSIS — N186 End stage renal disease: Secondary | ICD-10-CM | POA: Diagnosis not present

## 2016-04-26 DIAGNOSIS — D509 Iron deficiency anemia, unspecified: Secondary | ICD-10-CM | POA: Diagnosis not present

## 2016-04-26 DIAGNOSIS — Z992 Dependence on renal dialysis: Secondary | ICD-10-CM | POA: Diagnosis not present

## 2016-04-26 DIAGNOSIS — Z23 Encounter for immunization: Secondary | ICD-10-CM | POA: Diagnosis not present

## 2016-04-27 MED ORDER — BLOOD GLUCOSE MONITOR KIT: PACK | 0 refills | Status: DC

## 2016-04-28 DIAGNOSIS — E162 Hypoglycemia, unspecified: Secondary | ICD-10-CM | POA: Diagnosis not present

## 2016-04-28 DIAGNOSIS — E11649 Type 2 diabetes mellitus with hypoglycemia without coma: Secondary | ICD-10-CM | POA: Diagnosis not present

## 2016-04-28 DIAGNOSIS — D509 Iron deficiency anemia, unspecified: Secondary | ICD-10-CM | POA: Diagnosis not present

## 2016-04-28 DIAGNOSIS — Z23 Encounter for immunization: Secondary | ICD-10-CM | POA: Diagnosis not present

## 2016-04-28 DIAGNOSIS — Z992 Dependence on renal dialysis: Secondary | ICD-10-CM | POA: Diagnosis not present

## 2016-04-28 DIAGNOSIS — N186 End stage renal disease: Secondary | ICD-10-CM | POA: Diagnosis not present

## 2016-04-30 DIAGNOSIS — L281 Prurigo nodularis: Secondary | ICD-10-CM | POA: Diagnosis not present

## 2016-04-30 DIAGNOSIS — B9689 Other specified bacterial agents as the cause of diseases classified elsewhere: Secondary | ICD-10-CM | POA: Diagnosis not present

## 2016-04-30 DIAGNOSIS — L02229 Furuncle of trunk, unspecified: Secondary | ICD-10-CM | POA: Diagnosis not present

## 2016-05-01 DIAGNOSIS — D509 Iron deficiency anemia, unspecified: Secondary | ICD-10-CM | POA: Diagnosis not present

## 2016-05-01 DIAGNOSIS — N186 End stage renal disease: Secondary | ICD-10-CM | POA: Diagnosis not present

## 2016-05-01 DIAGNOSIS — Z992 Dependence on renal dialysis: Secondary | ICD-10-CM | POA: Diagnosis not present

## 2016-05-02 ENCOUNTER — Ambulatory Visit: Payer: Medicaid Other | Admitting: Cardiovascular Disease

## 2016-05-02 ENCOUNTER — Telehealth: Payer: Self-pay | Admitting: Family Medicine

## 2016-05-02 NOTE — Telephone Encounter (Signed)
Called patient and answered her question

## 2016-05-03 DIAGNOSIS — N186 End stage renal disease: Secondary | ICD-10-CM | POA: Diagnosis not present

## 2016-05-03 DIAGNOSIS — Z992 Dependence on renal dialysis: Secondary | ICD-10-CM | POA: Diagnosis not present

## 2016-05-03 DIAGNOSIS — D509 Iron deficiency anemia, unspecified: Secondary | ICD-10-CM | POA: Diagnosis not present

## 2016-05-04 ENCOUNTER — Encounter: Payer: Self-pay | Admitting: Gastroenterology

## 2016-05-04 ENCOUNTER — Ambulatory Visit (INDEPENDENT_AMBULATORY_CARE_PROVIDER_SITE_OTHER): Payer: Medicare Other | Admitting: Gastroenterology

## 2016-05-04 VITALS — BP 142/82 | HR 59 | Temp 97.9°F | Ht 63.0 in | Wt 171.8 lb

## 2016-05-04 DIAGNOSIS — R1319 Other dysphagia: Secondary | ICD-10-CM

## 2016-05-04 DIAGNOSIS — R160 Hepatomegaly, not elsewhere classified: Secondary | ICD-10-CM | POA: Diagnosis not present

## 2016-05-04 DIAGNOSIS — R195 Other fecal abnormalities: Secondary | ICD-10-CM | POA: Diagnosis not present

## 2016-05-04 DIAGNOSIS — R1310 Dysphagia, unspecified: Secondary | ICD-10-CM

## 2016-05-04 DIAGNOSIS — R7989 Other specified abnormal findings of blood chemistry: Secondary | ICD-10-CM

## 2016-05-04 DIAGNOSIS — K529 Noninfective gastroenteritis and colitis, unspecified: Secondary | ICD-10-CM

## 2016-05-04 DIAGNOSIS — D649 Anemia, unspecified: Secondary | ICD-10-CM | POA: Insufficient documentation

## 2016-05-04 DIAGNOSIS — R945 Abnormal results of liver function studies: Secondary | ICD-10-CM | POA: Insufficient documentation

## 2016-05-04 NOTE — Patient Instructions (Signed)
1. Please have you abdominal ultrasound done. 2. Please complete labs and stool tests.  3. Further recommendations to follow once results are back.

## 2016-05-04 NOTE — Progress Notes (Signed)
Primary Care Physician:  Worthy Rancher, MD  Primary Gastroenterologist:  Barney Drain, MD   Chief Complaint  Patient presents with  . Diarrhea    HPI:  Kara Knapp is a 45 y.o. female here As a new patient referral for bowel incontinence/diarrhea. Requested by Dr. Kenn File.  Intermittent explosive stools, a lot of gas. Going on since cholecystectomy in 2014. Lately every time pass gas has to be concerned about fecal incontinence. Some intermittent brbpr. Rectal pain from all the bowel movements. Stools lately have been kind of orange in color. Cannot tell what color stools are for sure due to visual problems. Rare formed bowel movements. Nocturnal stools. All symptoms worse the last one month. Some abdominal cramping. Sometimes better with BMs. No n/v. No heartburn. Some dysphagia. Food doesn't want to go down. Sometimes comes up. All solid foods. No prior colonoscopy/egd. Multiple bowel movements per day, at least 4-5 times a day sometimes worse. Recently trying A+D ointment, just started.   With review of medical records, it is noted that patient has abnormal LFTs, specifically alkaline phosphatase, AST/ALT. No history of IV/intranasal drug use. No tattoos. Blood transfusion couple of years ago or so. No blood donations. Receiving Hep B vaccination with dialysis. Has one more shot. Immunity to hepatitis A unknown. She's never been checked for hepatitis C to her knowledge. She states she was unaware of abnormal LFTs. Her other concerns are that of her hair coming out. She also has been seen a dermatologist for skin lesions/infections. States she was told it was folliculitis.   Patient has history of end-stage renal disease, has been on dialysis for several months now.  Current Outpatient Prescriptions  Medication Sig Dispense Refill  . aspirin 81 MG tablet Take 81 mg by mouth daily.    . blood glucose meter kit and supplies KIT Podigy Voice GLucometer - patient is visually  impaired and needs meter that reads results.  Use to check BG up to QID.  Dx: E11.319 Z79.4 1 each 0  . blood glucose meter kit and supplies KIT Dispense based on patient and insurance preference. Use up to four times daily as directed. (FOR ICD-9 250.00, 250.01). 1 each 0  . calcium acetate (PHOSLO) 667 MG capsule Take 1,334 mg by mouth 3 (three) times daily with meals.    . carvedilol (COREG) 12.5 MG tablet Take 12.5 mg by mouth 2 (two) times daily.  5  . cholecalciferol (VITAMIN D) 1000 units tablet Take 1,000 Units by mouth daily.    . citalopram (CELEXA) 20 MG tablet Take 1 tablet (20 mg total) by mouth daily. Reported on 08/12/2015 90 tablet 0  . doxycycline (VIBRA-TABS) 100 MG tablet Take 1 tablet (100 mg total) by mouth 2 (two) times daily. 1 po bid 20 tablet 0  . fluticasone (FLONASE) 50 MCG/ACT nasal spray Place 2 sprays into both nostrils daily. (Patient taking differently: Place 2 sprays into both nostrils daily as needed for allergies. ) 16 g 11  . gabapentin (NEURONTIN) 300 MG capsule TAKE ONE CAPSULE BY MOUTH THREE TIMES DAILY. 90 capsule 2  . glucose blood (ACCU-CHEK AVIVA) test strip Test up to qid. Dx E11.319 100 each 5  . HUMALOG KWIKPEN 100 UNIT/ML KiwkPen INJECT 7-10 UNITS INTO THE SKIN THREE TIMES DAILY. 15 mL 2  . hydrALAZINE (APRESOLINE) 25 MG tablet Take 1 tablet (25 mg total) by mouth 3 (three) times daily. (Patient taking differently: Take 50 mg by mouth 2 (two) times daily. ) 90  tablet 1  . LANTUS SOLOSTAR 100 UNIT/ML Solostar Pen INJECT 15 UNITS EVERY EVENING. THIS IS A 100 DAY SUPPLY. 15 mL 1   No current facility-administered medications for this visit.     Allergies as of 05/04/2016 - Review Complete 05/04/2016  Allergen Reaction Noted  . Bactrim [sulfamethoxazole-trimethoprim] Nausea And Vomiting 05/21/2013  . Prednisone Other (See Comments) 07/09/2013    Past Medical History:  Diagnosis Date  . Anemia of chronic disease   . Asthma   . Blind left eye   .  Bronchitis   . Cataract   . CHF (congestive heart failure) (HCC)    Presumed diastolic  . Cholecystitis, acute 05/26/2013   Status post cholecystectomy  . Chronic kidney disease    Stage III-IV. started diaylsis 12/29/15, T, TH, Sat  . Depression   . Diabetic foot ulcer ([place]) 03/01/2015  . Dialysis patient ([place])   . Fibroids   . Glaucoma   . History of blood transfusion   . Hyperlipidemia   . Hypertension   . Insulin-dependent diabetes mellitus with retinopathy ([place])   . Neuropathy due to secondary diabetes mellitus ([place])   . Osteomyelitis ([place])    left middle toe   . Pneumonia   . Tobacco abuse     Past Surgical History:  Procedure Laterality Date  . AV FISTULA PLACEMENT Right 10/17/2015   Procedure: INSERTION OF ARTERIOVENOUS GORE-TEX GRAFT RIGHT UPPER ARM WITH ACUSEAL;  Surgeon: Conrad Graysville, MD;  Location: [facility];  Service: Vascular;  Laterality: Right;  . CATARACT EXTRACTION W/ INTRAOCULAR LENS IMPLANT Bilateral   . CESAREAN SECTION    . CHOLECYSTECTOMY N/A 05/25/2013   Procedure: LAPAROSCOPIC CHOLECYSTECTOMY;  Surgeon: Jamesetta So, MD;  Location: [facility];  Service: General;  Laterality: N/A;  . EYE SURGERY    . PARS PLANA VITRECTOMY Left 11/24/2014   Procedure: PARS PLANA VITRECTOMY WITH 25 GAUGE;  Surgeon: Hurman Horn, MD;  Location: [facility];  Service: Ophthalmology;  Laterality: Left;  . PERIPHERAL VASCULAR CATHETERIZATION N/A 04/28/2015   Procedure: Bilateral Upper Extremity Venography;  Surgeon: Conrad Mather, MD;  Location: [facility];  Service: Cardiovascular;  Laterality: N/A;  . PHOTOCOAGULATION WITH LASER Left 11/24/2014   Procedure: PHOTOCOAGULATION WITH LASER;  Surgeon: Hurman Horn, MD;  Location: [facility];  Service: Ophthalmology;  Laterality: Left;  with insertion of silicone oil  . TUBAL LIGATION      Family History  Problem Relation Age of Onset  . COPD Mother   . Cancer Father   . Lymphoma Father   . Diabetes Sister   . Deep vein thrombosis Sister    . Diabetes Brother   . Hyperlipidemia Brother   . Hypertension Brother   . Mental retardation Sister   . Colon cancer Neg Hx   . Liver disease Neg Hx     Social History   Social History  . Marital status: Legally Separated    Spouse name: N/A  . Number of children: N/A  . Years of education: N/A   Occupational History  . Not on file.   Social History Main Topics  . Smoking status: Current Every Day Smoker    Packs/day: 1.00    Years: 21.00    Types: Cigarettes  . Smokeless tobacco: Never Used  . Alcohol use No  . Drug use: No  . Sexual activity: Yes    Birth control/ protection: Surgical   Other Topics Concern  . Not on file  Social History Narrative  . No narrative on file      ROS:  General: Negative for anorexia,   fever, chills, fatigue, weakness. Weight is down nearly 45 pounds since April 2017. Eyes: Negative for vision changes.  ENT: Negative for hoarseness, nasal congestion. CV: Negative for chest pain, angina, palpitations, dyspnea on exertion, peripheral edema.  Respiratory: Negative for dyspnea at rest, dyspnea on exertion, cough, sputum, wheezing.  GI: See history of present illness. GU:  Negative for dysuria, hematuria, urinary incontinence, urinary frequency, nocturnal urination.  MS: Negative for joint pain, low back pain.  Derm: Negative for rash or itching.  Neuro: Negative for weakness, abnormal sensation, seizure, frequent headaches, memory loss, confusion.  Psych: Negative for anxiety, depression, suicidal ideation, hallucinations.  Endo: see hpi Heme: Negative for bruising or bleeding. Allergy: Negative for rash or hives.    Physical Examination:  BP (!) 142/82   Pulse (!) 59   Temp 97.9 F (36.6 C) (Oral)   Ht 5' 3"  (1.6 m)   Wt 171 lb 12.8 oz (77.9 kg)   LMP 05/05/2015   BMI 30.43 kg/m    General: Well-nourished, well-developed in no acute distress.  Head: Normocephalic, atraumatic.   Eyes: Conjunctiva pink, no  icterus. Mouth: Oropharyngeal mucosa moist and pink , no lesions erythema or exudate. Neck: Supple without thyromegaly, masses, or lymphadenopathy.  Lungs: Clear to auscultation bilaterally.  Heart: Regular rate and rhythm, no murmurs rubs or gallops.  Abdomen: Bowel sounds are normal, nontender, nondistended, no abdominal bruits or hernia , no rebound or guarding.  Liver very prominent especially in the epigastrium crossing the midline. Rectal: Mild erythema noted. Occasionally but no discrete rash. Internal rectal exam slightly tender, no masses in the rectal vault, adequate sphincter tone, heme positive. Extremities: No lower extremity edema. No clubbing or deformities.  Neuro: Alert and oriented x 4 , grossly normal neurologically.  Skin: Warm and dry, no rash or jaundice.   Psych: Alert and cooperative, normal mood and affect.  Labs: Lab Results  Component Value Date   WBC 6.9 12/13/2015   HGB 11.2 12/13/2015   HCT 36.3 12/13/2015   MCV 102 (H) 12/13/2015   PLT 288 12/13/2015   Lab Results  Component Value Date   CREATININE 5.09 (>) 12/13/2015   BUN 54 (H) 12/13/2015   NA 135 12/13/2015   K 4.6 12/13/2015   CL 95 (L) 12/13/2015   CO2 18 12/13/2015   Lab Results  Component Value Date   ALT 60 (H) 12/13/2015   AST 43 (H) 12/13/2015   ALKPHOS 453 (H) 12/13/2015   BILITOT 0.8 12/13/2015   Lab Results  Component Value Date   IRON 44 01/21/2015   TIBC 363 01/21/2015   FERRITIN 51 01/21/2015   Lab Results  Component Value Date   HGBA1C 8.1 11/2015   09/2015: iron 56, TIBC 328, fe sat 18%, ferritin 55  Imaging Studies: No results found.

## 2016-05-05 DIAGNOSIS — N186 End stage renal disease: Secondary | ICD-10-CM | POA: Diagnosis not present

## 2016-05-05 DIAGNOSIS — Z992 Dependence on renal dialysis: Secondary | ICD-10-CM | POA: Diagnosis not present

## 2016-05-05 DIAGNOSIS — D509 Iron deficiency anemia, unspecified: Secondary | ICD-10-CM | POA: Diagnosis not present

## 2016-05-07 NOTE — Assessment & Plan Note (Signed)
Mild anemia, likely anemia of chronic disease. Heme positive today. Plan on colonoscopy with possible upper endoscopy in the near future pending labs and stool studies.

## 2016-05-07 NOTE — Progress Notes (Signed)
cc'ed to pcp °

## 2016-05-07 NOTE — Assessment & Plan Note (Signed)
Consider upper endoscopy in the near future with esophageal dilation. Await labs and stool studies first, plan for EGD at time of colonoscopy.

## 2016-05-07 NOTE — Assessment & Plan Note (Signed)
Chronic diarrhea without workup. Check stool studies. Screen for celiac disease. Will consider colonoscopy in the near future for heme positive stools but would like to rule out infectious etiology first. Patient voiced understanding.

## 2016-05-07 NOTE — Assessment & Plan Note (Signed)
Hepatomegaly noted on exam. Mildly elevated AST/ALT, elevated alkaline phosphatase in the setting of renal disease. Recommend labs, ultrasound. Further recommendations to follow.

## 2016-05-08 DIAGNOSIS — D509 Iron deficiency anemia, unspecified: Secondary | ICD-10-CM | POA: Diagnosis not present

## 2016-05-08 DIAGNOSIS — Z992 Dependence on renal dialysis: Secondary | ICD-10-CM | POA: Diagnosis not present

## 2016-05-08 DIAGNOSIS — N186 End stage renal disease: Secondary | ICD-10-CM | POA: Diagnosis not present

## 2016-05-10 DIAGNOSIS — N186 End stage renal disease: Secondary | ICD-10-CM | POA: Diagnosis not present

## 2016-05-10 DIAGNOSIS — D509 Iron deficiency anemia, unspecified: Secondary | ICD-10-CM | POA: Diagnosis not present

## 2016-05-10 DIAGNOSIS — Z992 Dependence on renal dialysis: Secondary | ICD-10-CM | POA: Diagnosis not present

## 2016-05-11 ENCOUNTER — Ambulatory Visit (HOSPITAL_COMMUNITY): Admission: RE | Admit: 2016-05-11 | Payer: Medicaid Other | Source: Ambulatory Visit

## 2016-05-12 DIAGNOSIS — N186 End stage renal disease: Secondary | ICD-10-CM | POA: Diagnosis not present

## 2016-05-12 DIAGNOSIS — D509 Iron deficiency anemia, unspecified: Secondary | ICD-10-CM | POA: Diagnosis not present

## 2016-05-12 DIAGNOSIS — Z992 Dependence on renal dialysis: Secondary | ICD-10-CM | POA: Diagnosis not present

## 2016-05-14 DIAGNOSIS — N186 End stage renal disease: Secondary | ICD-10-CM | POA: Diagnosis not present

## 2016-05-14 DIAGNOSIS — D509 Iron deficiency anemia, unspecified: Secondary | ICD-10-CM | POA: Diagnosis not present

## 2016-05-14 DIAGNOSIS — Z992 Dependence on renal dialysis: Secondary | ICD-10-CM | POA: Diagnosis not present

## 2016-05-16 DIAGNOSIS — Z992 Dependence on renal dialysis: Secondary | ICD-10-CM | POA: Diagnosis not present

## 2016-05-16 DIAGNOSIS — N186 End stage renal disease: Secondary | ICD-10-CM | POA: Diagnosis not present

## 2016-05-16 DIAGNOSIS — D509 Iron deficiency anemia, unspecified: Secondary | ICD-10-CM | POA: Diagnosis not present

## 2016-05-17 ENCOUNTER — Ambulatory Visit (HOSPITAL_COMMUNITY): Admission: RE | Admit: 2016-05-17 | Payer: Medicaid Other | Source: Ambulatory Visit

## 2016-05-18 ENCOUNTER — Telehealth: Payer: Self-pay

## 2016-05-18 ENCOUNTER — Telehealth: Payer: Self-pay | Admitting: Family Medicine

## 2016-05-18 NOTE — Telephone Encounter (Signed)
PT called and asked for new bottles to collect her stool. She is aware I am leaving them at the front for pick up.

## 2016-05-18 NOTE — Telephone Encounter (Signed)
Call given to nurse °

## 2016-05-19 DIAGNOSIS — N186 End stage renal disease: Secondary | ICD-10-CM | POA: Diagnosis not present

## 2016-05-19 DIAGNOSIS — D509 Iron deficiency anemia, unspecified: Secondary | ICD-10-CM | POA: Diagnosis not present

## 2016-05-19 DIAGNOSIS — Z992 Dependence on renal dialysis: Secondary | ICD-10-CM | POA: Diagnosis not present

## 2016-05-21 DIAGNOSIS — Z992 Dependence on renal dialysis: Secondary | ICD-10-CM | POA: Diagnosis not present

## 2016-05-21 DIAGNOSIS — D509 Iron deficiency anemia, unspecified: Secondary | ICD-10-CM | POA: Diagnosis not present

## 2016-05-21 DIAGNOSIS — N186 End stage renal disease: Secondary | ICD-10-CM | POA: Diagnosis not present

## 2016-05-23 DIAGNOSIS — N186 End stage renal disease: Secondary | ICD-10-CM | POA: Diagnosis not present

## 2016-05-23 DIAGNOSIS — D509 Iron deficiency anemia, unspecified: Secondary | ICD-10-CM | POA: Diagnosis not present

## 2016-05-23 DIAGNOSIS — Z992 Dependence on renal dialysis: Secondary | ICD-10-CM | POA: Diagnosis not present

## 2016-05-25 ENCOUNTER — Ambulatory Visit: Payer: Medicaid Other | Admitting: Cardiology

## 2016-05-25 DIAGNOSIS — N186 End stage renal disease: Secondary | ICD-10-CM | POA: Diagnosis not present

## 2016-05-25 DIAGNOSIS — D509 Iron deficiency anemia, unspecified: Secondary | ICD-10-CM | POA: Diagnosis not present

## 2016-05-25 DIAGNOSIS — Z992 Dependence on renal dialysis: Secondary | ICD-10-CM | POA: Diagnosis not present

## 2016-05-29 DIAGNOSIS — Z992 Dependence on renal dialysis: Secondary | ICD-10-CM | POA: Diagnosis not present

## 2016-05-29 DIAGNOSIS — N186 End stage renal disease: Secondary | ICD-10-CM | POA: Diagnosis not present

## 2016-05-30 DIAGNOSIS — E11649 Type 2 diabetes mellitus with hypoglycemia without coma: Secondary | ICD-10-CM | POA: Diagnosis not present

## 2016-05-30 DIAGNOSIS — D509 Iron deficiency anemia, unspecified: Secondary | ICD-10-CM | POA: Diagnosis not present

## 2016-05-30 DIAGNOSIS — N186 End stage renal disease: Secondary | ICD-10-CM | POA: Diagnosis not present

## 2016-05-30 DIAGNOSIS — N250 Renal osteodystrophy: Secondary | ICD-10-CM | POA: Diagnosis not present

## 2016-05-30 DIAGNOSIS — Z992 Dependence on renal dialysis: Secondary | ICD-10-CM | POA: Diagnosis not present

## 2016-05-30 DIAGNOSIS — Z794 Long term (current) use of insulin: Secondary | ICD-10-CM | POA: Diagnosis not present

## 2016-05-30 DIAGNOSIS — E162 Hypoglycemia, unspecified: Secondary | ICD-10-CM | POA: Diagnosis not present

## 2016-06-01 DIAGNOSIS — N186 End stage renal disease: Secondary | ICD-10-CM | POA: Diagnosis not present

## 2016-06-01 DIAGNOSIS — E11649 Type 2 diabetes mellitus with hypoglycemia without coma: Secondary | ICD-10-CM | POA: Diagnosis not present

## 2016-06-01 DIAGNOSIS — Z992 Dependence on renal dialysis: Secondary | ICD-10-CM | POA: Diagnosis not present

## 2016-06-01 DIAGNOSIS — D509 Iron deficiency anemia, unspecified: Secondary | ICD-10-CM | POA: Diagnosis not present

## 2016-06-01 DIAGNOSIS — E162 Hypoglycemia, unspecified: Secondary | ICD-10-CM | POA: Diagnosis not present

## 2016-06-01 DIAGNOSIS — N250 Renal osteodystrophy: Secondary | ICD-10-CM | POA: Diagnosis not present

## 2016-06-04 ENCOUNTER — Other Ambulatory Visit: Payer: Self-pay | Admitting: *Deleted

## 2016-06-04 MED ORDER — CARVEDILOL 12.5 MG PO TABS
12.5000 mg | ORAL_TABLET | Freq: Two times a day (BID) | ORAL | 5 refills | Status: DC
Start: 2016-06-04 — End: 2016-06-13

## 2016-06-05 DIAGNOSIS — D509 Iron deficiency anemia, unspecified: Secondary | ICD-10-CM | POA: Diagnosis not present

## 2016-06-05 DIAGNOSIS — E162 Hypoglycemia, unspecified: Secondary | ICD-10-CM | POA: Diagnosis not present

## 2016-06-05 DIAGNOSIS — N186 End stage renal disease: Secondary | ICD-10-CM | POA: Diagnosis not present

## 2016-06-05 DIAGNOSIS — E11649 Type 2 diabetes mellitus with hypoglycemia without coma: Secondary | ICD-10-CM | POA: Diagnosis not present

## 2016-06-05 DIAGNOSIS — Z992 Dependence on renal dialysis: Secondary | ICD-10-CM | POA: Diagnosis not present

## 2016-06-05 DIAGNOSIS — N250 Renal osteodystrophy: Secondary | ICD-10-CM | POA: Diagnosis not present

## 2016-06-06 ENCOUNTER — Encounter: Payer: Self-pay | Admitting: Cardiology

## 2016-06-06 DIAGNOSIS — N186 End stage renal disease: Secondary | ICD-10-CM | POA: Diagnosis not present

## 2016-06-06 DIAGNOSIS — E11649 Type 2 diabetes mellitus with hypoglycemia without coma: Secondary | ICD-10-CM | POA: Diagnosis not present

## 2016-06-06 DIAGNOSIS — Z992 Dependence on renal dialysis: Secondary | ICD-10-CM | POA: Diagnosis not present

## 2016-06-06 DIAGNOSIS — E162 Hypoglycemia, unspecified: Secondary | ICD-10-CM | POA: Diagnosis not present

## 2016-06-06 DIAGNOSIS — N250 Renal osteodystrophy: Secondary | ICD-10-CM | POA: Diagnosis not present

## 2016-06-06 DIAGNOSIS — D509 Iron deficiency anemia, unspecified: Secondary | ICD-10-CM | POA: Diagnosis not present

## 2016-06-06 NOTE — Progress Notes (Signed)
Cardiology Office Note  Date: 06/07/2016   ID: Kara Knapp, DOB 1971-04-22, MRN [id number]  PCP: Kenn File, MD  Consulting Cardiologist: Rozann Lesches, MD   Chief Complaint  Patient presents with  . Cardiac follow-up    History of Present Illness: Kara Knapp is a 45 y.o. female referred for cardiology consultation by Dr. Wendi Knapp. I reviewed her records. She is a former patient of the [facility] cardiology practice, last saw Dr. Hamilton Knapp back in May.   Cardiac history includes Lexiscan Myoview done at [facility] back in January of this year that revealed a small region of anteroseptal ischemia with normal LVEF 64% and no wall motion abnormalities. This was managed medically. Echocardiogram from last year is reviewed below. LVEF normal range and diastolic function was described as normal as well.  We discussed her history, it sounds like she had evidence of significant volume overload in the face of declining renal function prior to starting hemodialysis. She tells me that her weight was as high as 215 pounds. Since starting on hemodialysis, she has done much better, her weight is down in the 170s. She does not report any chest pain with her sessions. She is not on a diuretic.  I reviewed her medications which include aspirin, Coreg and hydralazine.  She has not had follow-up assessment of cardiac structure and function since last year.  Past Medical History:  Diagnosis Date  . Anemia of chronic disease   . Asthma   . Blind left eye   . Bronchitis   . Cataract   . Cholecystitis, acute 05/26/2013   Status post cholecystectomy  . Depression   . Diabetic foot ulcer ([place]) 03/01/2015  . ESRD on hemodialysis ([place])    Started diaylsis 12/29/15, T, TH, Sat  . Essential hypertension   . Fibroids   . Glaucoma   . History of blood transfusion   . History of pneumonia   . Hyperlipidemia   . Insulin-dependent diabetes mellitus with retinopathy ([place])   . Osteomyelitis  ([place])    Toe on left foot    Past Surgical History:  Procedure Laterality Date  . AV FISTULA PLACEMENT Right 10/17/2015   Procedure: INSERTION OF ARTERIOVENOUS GORE-TEX GRAFT RIGHT UPPER ARM WITH ACUSEAL;  Surgeon: Conrad Eva, MD;  Location: [facility];  Service: Vascular;  Laterality: Right;  . CATARACT EXTRACTION W/ INTRAOCULAR LENS IMPLANT Bilateral   . CESAREAN SECTION    . CHOLECYSTECTOMY N/A 05/25/2013   Procedure: LAPAROSCOPIC CHOLECYSTECTOMY;  Surgeon: Jamesetta So, MD;  [facility];  Service: General;  Laterality: N/A;  . EYE SURGERY    . PARS PLANA VITRECTOMY Left 11/24/2014   Procedure: PARS PLANA VITRECTOMY WITH 25 GAUGE;  Surgeon: Hurman Horn, MD;  Location: [facility];  Service: Ophthalmology;  Laterality: Left;  . PERIPHERAL VASCULAR CATHETERIZATION N/A 04/28/2015   Procedure: Bilateral Upper Extremity Venography;  Surgeon: Conrad Walsenburg, MD;  Location: [facility];  Service: Cardiovascular;  Laterality: N/A;  . PHOTOCOAGULATION WITH LASER Left 11/24/2014   Procedure: PHOTOCOAGULATION WITH LASER;  Surgeon: Hurman Horn, MD;  Location: [facility];  Service: Ophthalmology;  Laterality: Left;  with insertion of silicone oil  . TUBAL LIGATION      Current Outpatient Prescriptions  Medication Sig Dispense Refill  . aspirin 81 MG tablet Take 81 mg by mouth daily.    . blood glucose meter kit and supplies KIT Podigy Voice GLucometer - patient is visually impaired and needs meter that  reads results.  Use to check BG up to QID.  Dx: E11.319 Z79.4 1 each 0  . blood glucose meter kit and supplies KIT Dispense based on patient and insurance preference. Use up to four times daily as directed. (FOR ICD-9 250.00, 250.01). 1 each 0  . calcium acetate (PHOSLO) 667 MG capsule Take 1,334 mg by mouth 3 (three) times daily with meals.    . carvedilol (COREG) 12.5 MG tablet Take 1 tablet (12.5 mg total) by mouth 2 (two) times daily. 60 tablet 5  . cholecalciferol (VITAMIN D) 1000 units tablet  Take 1,000 Units by mouth daily.    . citalopram (CELEXA) 20 MG tablet Take 1 tablet (20 mg total) by mouth daily. Reported on 08/12/2015 90 tablet 0  . fluticasone (FLONASE) 50 MCG/ACT nasal spray Place 2 sprays into both nostrils daily. (Patient taking differently: Place 2 sprays into both nostrils daily as needed for allergies. ) 16 g 11  . gabapentin (NEURONTIN) 300 MG capsule TAKE ONE CAPSULE BY MOUTH THREE TIMES DAILY. 90 capsule 2  . glucose blood (ACCU-CHEK AVIVA) test strip Test up to qid. Dx E11.319 100 each 5  . HUMALOG KWIKPEN 100 UNIT/ML KiwkPen INJECT 7-10 UNITS INTO THE SKIN THREE TIMES DAILY. 15 mL 2  . hydrALAZINE (APRESOLINE) 25 MG tablet Take 1 tablet (25 mg total) by mouth 3 (three) times daily. (Patient taking differently: Take 25 mg by mouth 2 (two) times daily. ) 90 tablet 1  . LANTUS SOLOSTAR 100 UNIT/ML Solostar Pen INJECT 15 UNITS EVERY EVENING. THIS IS A 100 DAY SUPPLY. 15 mL 1  . sevelamer carbonate (RENVELA) 800 MG tablet Take 1,600 mg by mouth 3 (three) times daily with meals. And with snacks.     No current facility-administered medications for this visit.    Allergies:  Bactrim [sulfamethoxazole-trimethoprim] and Prednisone   Social History: The patient  reports that she has been smoking Cigarettes.  She has a 21.00 pack-year smoking history. She has never used smokeless tobacco. She reports that she does not drink alcohol or use drugs.   Family History: The patient's family history includes COPD in her mother; Cancer in her father; Deep vein thrombosis in her sister; Diabetes in her brother and sister; Hyperlipidemia in her brother; Hypertension in her brother; Lymphoma in her father; Mental retardation in her sister.   ROS:  Please see the history of present illness. Otherwise, complete review of systems is positive for blindness.  All other systems are reviewed and negative.   Physical Exam: VS:  BP (!) 165/79   Pulse 64   Ht 5' 3"  (1.6 m)   Wt 174 lb 12.8 oz  (79.3 kg)   SpO2 94%   BMI 30.96 kg/m , BMI Body mass index is 30.96 kg/m.  Wt Readings from Last 3 Encounters:  06/07/16 174 lb 12.8 oz (79.3 kg)  05/04/16 171 lb 12.8 oz (77.9 kg)  04/11/16 177 lb 3.2 oz (80.4 kg)     General: Chronically ill-appearing woman, no distress. HEENT: Conjunctiva and lids normal, oropharynx clear with poor dentition. Neck: Supple, no elevated JVP or carotid bruits, no thyromegaly. Lungs: Clear to auscultation, nonlabored breathing at rest. Cardiac: Regular rate and rhythm, no S3, soft systolic murmur, no pericardial rub. Abdomen: Soft, nontender, bowel sounds present. Extremities: Mild ankle edema, distal pulses 2+. Skin: Warm and dry. Musculoskeletal: No kyphosis. Neuropsychiatric: Alert and oriented x3, affect grossly appropriate.  ECG: I personally reviewed the tracing from 07/27/2015 which showed normal sinus rhythm.  Recent Labwork: 07/27/2015: B Natriuretic Peptide 675.0 10/17/2015: Hemoglobin 8.8 12/13/2015: ALT 60; AST 43; BUN 54; Creatinine, Ser 5.09; Platelets 288; Potassium 4.6; Sodium 135     Component Value Date/Time   CHOL 178 06/10/2015 1055   TRIG 94 06/10/2015 1055   HDL 46 06/10/2015 1055   CHOLHDL 3.9 06/10/2015 1055   LDLCALC 113 (H) 06/10/2015 1055    Other Studies Reviewed Today:  Echocardiogram 12/09/2014 [facility]): Mild to moderate LVH with LVEF greater than 65%, normal diastolic filling, mild left atrial enlargement, trace aortic regurgitation, trace mitral regurgitation, mild tricuspid regurgitation, no pericardial effusion.  Assessment and Plan:  1. History of volume overload in the past, most likely secondary to advancing renal failure. She has improved significantly after starting hemodialysis. Echocardiogram from last year revealed normal LVEF and reportedly normal diastolic function as well. At this point would recommend a follow-up echocardiogram and observation.  2. Essential hypertension, continue Coreg and  hydralazine.  3. End-stage renal disease on hemodialysis. She follows with Dr. Lowanda Foster.  4. History of hyperlipidemia. Last cholesterol was only 178 with LDL 113. She is currently not on statin therapy.  Current medicines were reviewed with the patient today.   Orders Placed This Encounter  Procedures  . ECHOCARDIOGRAM COMPLETE    Disposition: Follow-up in 6 months.  Signed, Satira Sark, MD, [facility] 06/07/2016 3:26 PM    [facility] at [address], [place], [place] [address] Phone: [phone]; Fax: [phone]

## 2016-06-07 ENCOUNTER — Encounter: Payer: Self-pay | Admitting: Cardiology

## 2016-06-07 ENCOUNTER — Ambulatory Visit (INDEPENDENT_AMBULATORY_CARE_PROVIDER_SITE_OTHER): Payer: Medicare Other | Admitting: Cardiology

## 2016-06-07 VITALS — BP 165/79 | HR 64 | Ht 63.0 in | Wt 174.8 lb

## 2016-06-07 DIAGNOSIS — I10 Essential (primary) hypertension: Secondary | ICD-10-CM | POA: Diagnosis not present

## 2016-06-07 DIAGNOSIS — N186 End stage renal disease: Secondary | ICD-10-CM

## 2016-06-07 DIAGNOSIS — E782 Mixed hyperlipidemia: Secondary | ICD-10-CM | POA: Diagnosis not present

## 2016-06-07 DIAGNOSIS — R0602 Shortness of breath: Secondary | ICD-10-CM | POA: Diagnosis not present

## 2016-06-07 DIAGNOSIS — Z992 Dependence on renal dialysis: Secondary | ICD-10-CM

## 2016-06-07 NOTE — Patient Instructions (Signed)
Your physician wants you to follow-up in: [place] DR. Domenic Polite You will receive a reminder letter in the mail two months in advance. If you don't receive a letter, please call our office to schedule the follow-up appointment.  Your physician recommends that you continue on your current medications as directed. Please refer to the Current Medication list given to you today.  Your physician has requested that you have an echocardiogram. Echocardiography is a painless test that uses sound waves to create images of your heart. It provides your doctor with information about the size and shape of your heart and how well your heart's chambers and valves are working. This procedure takes approximately one hour. There are no restrictions for this procedure.  Thank you for choosing [facility]!!

## 2016-06-08 DIAGNOSIS — N186 End stage renal disease: Secondary | ICD-10-CM | POA: Diagnosis not present

## 2016-06-08 DIAGNOSIS — D509 Iron deficiency anemia, unspecified: Secondary | ICD-10-CM | POA: Diagnosis not present

## 2016-06-08 DIAGNOSIS — E162 Hypoglycemia, unspecified: Secondary | ICD-10-CM | POA: Diagnosis not present

## 2016-06-08 DIAGNOSIS — N250 Renal osteodystrophy: Secondary | ICD-10-CM | POA: Diagnosis not present

## 2016-06-08 DIAGNOSIS — Z992 Dependence on renal dialysis: Secondary | ICD-10-CM | POA: Diagnosis not present

## 2016-06-08 DIAGNOSIS — E11649 Type 2 diabetes mellitus with hypoglycemia without coma: Secondary | ICD-10-CM | POA: Diagnosis not present

## 2016-06-11 DIAGNOSIS — D509 Iron deficiency anemia, unspecified: Secondary | ICD-10-CM | POA: Diagnosis not present

## 2016-06-11 DIAGNOSIS — E162 Hypoglycemia, unspecified: Secondary | ICD-10-CM | POA: Diagnosis not present

## 2016-06-11 DIAGNOSIS — Z992 Dependence on renal dialysis: Secondary | ICD-10-CM | POA: Diagnosis not present

## 2016-06-11 DIAGNOSIS — E11649 Type 2 diabetes mellitus with hypoglycemia without coma: Secondary | ICD-10-CM | POA: Diagnosis not present

## 2016-06-11 DIAGNOSIS — N250 Renal osteodystrophy: Secondary | ICD-10-CM | POA: Diagnosis not present

## 2016-06-11 DIAGNOSIS — N186 End stage renal disease: Secondary | ICD-10-CM | POA: Diagnosis not present

## 2016-06-13 ENCOUNTER — Other Ambulatory Visit: Payer: Self-pay | Admitting: *Deleted

## 2016-06-13 DIAGNOSIS — D509 Iron deficiency anemia, unspecified: Secondary | ICD-10-CM | POA: Diagnosis not present

## 2016-06-13 DIAGNOSIS — N186 End stage renal disease: Secondary | ICD-10-CM | POA: Diagnosis not present

## 2016-06-13 DIAGNOSIS — Z992 Dependence on renal dialysis: Secondary | ICD-10-CM | POA: Diagnosis not present

## 2016-06-13 DIAGNOSIS — E162 Hypoglycemia, unspecified: Secondary | ICD-10-CM | POA: Diagnosis not present

## 2016-06-13 DIAGNOSIS — N250 Renal osteodystrophy: Secondary | ICD-10-CM | POA: Diagnosis not present

## 2016-06-13 DIAGNOSIS — E11649 Type 2 diabetes mellitus with hypoglycemia without coma: Secondary | ICD-10-CM | POA: Diagnosis not present

## 2016-06-13 MED ORDER — CITALOPRAM HYDROBROMIDE 20 MG PO TABS: 20.0000 mg | ORAL_TABLET | Freq: Every day | ORAL | 1 refills | Status: DC

## 2016-06-13 MED ORDER — INSULIN GLARGINE 100 UNIT/ML SOLOSTAR PEN: PEN_INJECTOR | SUBCUTANEOUS | 1 refills | Status: DC

## 2016-06-13 MED ORDER — INSULIN LISPRO 100 UNIT/ML (KWIKPEN): PEN_INJECTOR | SUBCUTANEOUS | 3 refills | Status: DC

## 2016-06-13 MED ORDER — CARVEDILOL 12.5 MG PO TABS
12.5000 mg | ORAL_TABLET | Freq: Two times a day (BID) | ORAL | 1 refills | Status: DC
Start: 2016-06-13 — End: 2017-02-08

## 2016-06-13 MED ORDER — GABAPENTIN 300 MG PO CAPS: 300.0000 mg | ORAL_CAPSULE | Freq: Three times a day (TID) | ORAL | 1 refills | Status: DC

## 2016-06-14 DIAGNOSIS — B351 Tinea unguium: Secondary | ICD-10-CM | POA: Diagnosis not present

## 2016-06-14 DIAGNOSIS — E1151 Type 2 diabetes mellitus with diabetic peripheral angiopathy without gangrene: Secondary | ICD-10-CM | POA: Diagnosis not present

## 2016-06-15 DIAGNOSIS — E162 Hypoglycemia, unspecified: Secondary | ICD-10-CM | POA: Diagnosis not present

## 2016-06-15 DIAGNOSIS — E11649 Type 2 diabetes mellitus with hypoglycemia without coma: Secondary | ICD-10-CM | POA: Diagnosis not present

## 2016-06-15 DIAGNOSIS — Z992 Dependence on renal dialysis: Secondary | ICD-10-CM | POA: Diagnosis not present

## 2016-06-15 DIAGNOSIS — D509 Iron deficiency anemia, unspecified: Secondary | ICD-10-CM | POA: Diagnosis not present

## 2016-06-15 DIAGNOSIS — N186 End stage renal disease: Secondary | ICD-10-CM | POA: Diagnosis not present

## 2016-06-15 DIAGNOSIS — N250 Renal osteodystrophy: Secondary | ICD-10-CM | POA: Diagnosis not present

## 2016-06-18 DIAGNOSIS — E162 Hypoglycemia, unspecified: Secondary | ICD-10-CM | POA: Diagnosis not present

## 2016-06-18 DIAGNOSIS — D509 Iron deficiency anemia, unspecified: Secondary | ICD-10-CM | POA: Diagnosis not present

## 2016-06-18 DIAGNOSIS — E11649 Type 2 diabetes mellitus with hypoglycemia without coma: Secondary | ICD-10-CM | POA: Diagnosis not present

## 2016-06-18 DIAGNOSIS — N186 End stage renal disease: Secondary | ICD-10-CM | POA: Diagnosis not present

## 2016-06-18 DIAGNOSIS — Z992 Dependence on renal dialysis: Secondary | ICD-10-CM | POA: Diagnosis not present

## 2016-06-18 DIAGNOSIS — N250 Renal osteodystrophy: Secondary | ICD-10-CM | POA: Diagnosis not present

## 2016-06-20 DIAGNOSIS — N186 End stage renal disease: Secondary | ICD-10-CM | POA: Diagnosis not present

## 2016-06-20 DIAGNOSIS — N250 Renal osteodystrophy: Secondary | ICD-10-CM | POA: Diagnosis not present

## 2016-06-20 DIAGNOSIS — Z992 Dependence on renal dialysis: Secondary | ICD-10-CM | POA: Diagnosis not present

## 2016-06-20 DIAGNOSIS — E11649 Type 2 diabetes mellitus with hypoglycemia without coma: Secondary | ICD-10-CM | POA: Diagnosis not present

## 2016-06-20 DIAGNOSIS — D509 Iron deficiency anemia, unspecified: Secondary | ICD-10-CM | POA: Diagnosis not present

## 2016-06-20 DIAGNOSIS — E162 Hypoglycemia, unspecified: Secondary | ICD-10-CM | POA: Diagnosis not present

## 2016-06-22 DIAGNOSIS — D509 Iron deficiency anemia, unspecified: Secondary | ICD-10-CM | POA: Diagnosis not present

## 2016-06-22 DIAGNOSIS — N250 Renal osteodystrophy: Secondary | ICD-10-CM | POA: Diagnosis not present

## 2016-06-22 DIAGNOSIS — E11649 Type 2 diabetes mellitus with hypoglycemia without coma: Secondary | ICD-10-CM | POA: Diagnosis not present

## 2016-06-22 DIAGNOSIS — N186 End stage renal disease: Secondary | ICD-10-CM | POA: Diagnosis not present

## 2016-06-22 DIAGNOSIS — Z992 Dependence on renal dialysis: Secondary | ICD-10-CM | POA: Diagnosis not present

## 2016-06-22 DIAGNOSIS — E162 Hypoglycemia, unspecified: Secondary | ICD-10-CM | POA: Diagnosis not present

## 2016-06-25 DIAGNOSIS — Z992 Dependence on renal dialysis: Secondary | ICD-10-CM | POA: Diagnosis not present

## 2016-06-25 DIAGNOSIS — N186 End stage renal disease: Secondary | ICD-10-CM | POA: Diagnosis not present

## 2016-06-25 DIAGNOSIS — E162 Hypoglycemia, unspecified: Secondary | ICD-10-CM | POA: Diagnosis not present

## 2016-06-25 DIAGNOSIS — E11649 Type 2 diabetes mellitus with hypoglycemia without coma: Secondary | ICD-10-CM | POA: Diagnosis not present

## 2016-06-25 DIAGNOSIS — N250 Renal osteodystrophy: Secondary | ICD-10-CM | POA: Diagnosis not present

## 2016-06-25 DIAGNOSIS — D509 Iron deficiency anemia, unspecified: Secondary | ICD-10-CM | POA: Diagnosis not present

## 2016-06-27 DIAGNOSIS — N250 Renal osteodystrophy: Secondary | ICD-10-CM | POA: Diagnosis not present

## 2016-06-27 DIAGNOSIS — N186 End stage renal disease: Secondary | ICD-10-CM | POA: Diagnosis not present

## 2016-06-27 DIAGNOSIS — E11649 Type 2 diabetes mellitus with hypoglycemia without coma: Secondary | ICD-10-CM | POA: Diagnosis not present

## 2016-06-27 DIAGNOSIS — E162 Hypoglycemia, unspecified: Secondary | ICD-10-CM | POA: Diagnosis not present

## 2016-06-27 DIAGNOSIS — D509 Iron deficiency anemia, unspecified: Secondary | ICD-10-CM | POA: Diagnosis not present

## 2016-06-27 DIAGNOSIS — Z992 Dependence on renal dialysis: Secondary | ICD-10-CM | POA: Diagnosis not present

## 2016-06-28 DIAGNOSIS — E113592 Type 2 diabetes mellitus with proliferative diabetic retinopathy without macular edema, left eye: Secondary | ICD-10-CM | POA: Diagnosis not present

## 2016-06-28 DIAGNOSIS — H4051X3 Glaucoma secondary to other eye disorders, right eye, severe stage: Secondary | ICD-10-CM | POA: Diagnosis not present

## 2016-06-28 DIAGNOSIS — E113511 Type 2 diabetes mellitus with proliferative diabetic retinopathy with macular edema, right eye: Secondary | ICD-10-CM | POA: Diagnosis not present

## 2016-06-28 DIAGNOSIS — H4052X3 Glaucoma secondary to other eye disorders, left eye, severe stage: Secondary | ICD-10-CM | POA: Diagnosis not present

## 2016-06-29 DIAGNOSIS — E11649 Type 2 diabetes mellitus with hypoglycemia without coma: Secondary | ICD-10-CM | POA: Diagnosis not present

## 2016-06-29 DIAGNOSIS — D509 Iron deficiency anemia, unspecified: Secondary | ICD-10-CM | POA: Diagnosis not present

## 2016-06-29 DIAGNOSIS — Z794 Long term (current) use of insulin: Secondary | ICD-10-CM | POA: Diagnosis not present

## 2016-06-29 DIAGNOSIS — Z992 Dependence on renal dialysis: Secondary | ICD-10-CM | POA: Diagnosis not present

## 2016-06-29 DIAGNOSIS — N186 End stage renal disease: Secondary | ICD-10-CM | POA: Diagnosis not present

## 2016-06-29 DIAGNOSIS — E162 Hypoglycemia, unspecified: Secondary | ICD-10-CM | POA: Diagnosis not present

## 2016-07-02 DIAGNOSIS — Z992 Dependence on renal dialysis: Secondary | ICD-10-CM | POA: Diagnosis not present

## 2016-07-02 DIAGNOSIS — E11649 Type 2 diabetes mellitus with hypoglycemia without coma: Secondary | ICD-10-CM | POA: Diagnosis not present

## 2016-07-02 DIAGNOSIS — Z794 Long term (current) use of insulin: Secondary | ICD-10-CM | POA: Diagnosis not present

## 2016-07-02 DIAGNOSIS — E162 Hypoglycemia, unspecified: Secondary | ICD-10-CM | POA: Diagnosis not present

## 2016-07-02 DIAGNOSIS — N186 End stage renal disease: Secondary | ICD-10-CM | POA: Diagnosis not present

## 2016-07-02 DIAGNOSIS — D509 Iron deficiency anemia, unspecified: Secondary | ICD-10-CM | POA: Diagnosis not present

## 2016-07-03 ENCOUNTER — Telehealth: Payer: Self-pay | Admitting: Family Medicine

## 2016-07-04 DIAGNOSIS — N186 End stage renal disease: Secondary | ICD-10-CM | POA: Diagnosis not present

## 2016-07-04 DIAGNOSIS — Z794 Long term (current) use of insulin: Secondary | ICD-10-CM | POA: Diagnosis not present

## 2016-07-04 DIAGNOSIS — Z992 Dependence on renal dialysis: Secondary | ICD-10-CM | POA: Diagnosis not present

## 2016-07-04 DIAGNOSIS — E162 Hypoglycemia, unspecified: Secondary | ICD-10-CM | POA: Diagnosis not present

## 2016-07-04 DIAGNOSIS — E11649 Type 2 diabetes mellitus with hypoglycemia without coma: Secondary | ICD-10-CM | POA: Diagnosis not present

## 2016-07-04 DIAGNOSIS — D509 Iron deficiency anemia, unspecified: Secondary | ICD-10-CM | POA: Diagnosis not present

## 2016-07-04 NOTE — Telephone Encounter (Signed)
Called company and gave verbal orders, pt aware

## 2016-07-05 ENCOUNTER — Other Ambulatory Visit: Payer: Medicare Other

## 2016-07-06 DIAGNOSIS — E11649 Type 2 diabetes mellitus with hypoglycemia without coma: Secondary | ICD-10-CM | POA: Diagnosis not present

## 2016-07-06 DIAGNOSIS — Z992 Dependence on renal dialysis: Secondary | ICD-10-CM | POA: Diagnosis not present

## 2016-07-06 DIAGNOSIS — N186 End stage renal disease: Secondary | ICD-10-CM | POA: Diagnosis not present

## 2016-07-06 DIAGNOSIS — D509 Iron deficiency anemia, unspecified: Secondary | ICD-10-CM | POA: Diagnosis not present

## 2016-07-06 DIAGNOSIS — Z794 Long term (current) use of insulin: Secondary | ICD-10-CM | POA: Diagnosis not present

## 2016-07-06 DIAGNOSIS — E162 Hypoglycemia, unspecified: Secondary | ICD-10-CM | POA: Diagnosis not present

## 2016-07-09 DIAGNOSIS — E162 Hypoglycemia, unspecified: Secondary | ICD-10-CM | POA: Diagnosis not present

## 2016-07-09 DIAGNOSIS — D509 Iron deficiency anemia, unspecified: Secondary | ICD-10-CM | POA: Diagnosis not present

## 2016-07-09 DIAGNOSIS — Z992 Dependence on renal dialysis: Secondary | ICD-10-CM | POA: Diagnosis not present

## 2016-07-09 DIAGNOSIS — Z794 Long term (current) use of insulin: Secondary | ICD-10-CM | POA: Diagnosis not present

## 2016-07-09 DIAGNOSIS — E11649 Type 2 diabetes mellitus with hypoglycemia without coma: Secondary | ICD-10-CM | POA: Diagnosis not present

## 2016-07-09 DIAGNOSIS — N186 End stage renal disease: Secondary | ICD-10-CM | POA: Diagnosis not present

## 2016-07-11 ENCOUNTER — Other Ambulatory Visit: Payer: Self-pay | Admitting: Family Medicine

## 2016-07-11 DIAGNOSIS — E11649 Type 2 diabetes mellitus with hypoglycemia without coma: Secondary | ICD-10-CM | POA: Diagnosis not present

## 2016-07-11 DIAGNOSIS — N186 End stage renal disease: Secondary | ICD-10-CM | POA: Diagnosis not present

## 2016-07-11 DIAGNOSIS — E162 Hypoglycemia, unspecified: Secondary | ICD-10-CM | POA: Diagnosis not present

## 2016-07-11 DIAGNOSIS — D509 Iron deficiency anemia, unspecified: Secondary | ICD-10-CM | POA: Diagnosis not present

## 2016-07-11 DIAGNOSIS — Z992 Dependence on renal dialysis: Secondary | ICD-10-CM | POA: Diagnosis not present

## 2016-07-11 DIAGNOSIS — Z794 Long term (current) use of insulin: Secondary | ICD-10-CM | POA: Diagnosis not present

## 2016-07-11 NOTE — Telephone Encounter (Signed)
done

## 2016-07-13 DIAGNOSIS — E11649 Type 2 diabetes mellitus with hypoglycemia without coma: Secondary | ICD-10-CM | POA: Diagnosis not present

## 2016-07-13 DIAGNOSIS — Z992 Dependence on renal dialysis: Secondary | ICD-10-CM | POA: Diagnosis not present

## 2016-07-13 DIAGNOSIS — E162 Hypoglycemia, unspecified: Secondary | ICD-10-CM | POA: Diagnosis not present

## 2016-07-13 DIAGNOSIS — N186 End stage renal disease: Secondary | ICD-10-CM | POA: Diagnosis not present

## 2016-07-13 DIAGNOSIS — D509 Iron deficiency anemia, unspecified: Secondary | ICD-10-CM | POA: Diagnosis not present

## 2016-07-13 DIAGNOSIS — Z794 Long term (current) use of insulin: Secondary | ICD-10-CM | POA: Diagnosis not present

## 2016-07-17 DIAGNOSIS — Z992 Dependence on renal dialysis: Secondary | ICD-10-CM | POA: Diagnosis not present

## 2016-07-17 DIAGNOSIS — N186 End stage renal disease: Secondary | ICD-10-CM | POA: Diagnosis not present

## 2016-07-17 DIAGNOSIS — E162 Hypoglycemia, unspecified: Secondary | ICD-10-CM | POA: Diagnosis not present

## 2016-07-17 DIAGNOSIS — E11649 Type 2 diabetes mellitus with hypoglycemia without coma: Secondary | ICD-10-CM | POA: Diagnosis not present

## 2016-07-17 DIAGNOSIS — Z794 Long term (current) use of insulin: Secondary | ICD-10-CM | POA: Diagnosis not present

## 2016-07-17 DIAGNOSIS — D509 Iron deficiency anemia, unspecified: Secondary | ICD-10-CM | POA: Diagnosis not present

## 2016-07-18 ENCOUNTER — Other Ambulatory Visit: Payer: Self-pay | Admitting: *Deleted

## 2016-07-18 DIAGNOSIS — N186 End stage renal disease: Secondary | ICD-10-CM | POA: Diagnosis not present

## 2016-07-18 DIAGNOSIS — E119 Type 2 diabetes mellitus without complications: Secondary | ICD-10-CM | POA: Diagnosis not present

## 2016-07-18 DIAGNOSIS — D509 Iron deficiency anemia, unspecified: Secondary | ICD-10-CM | POA: Diagnosis not present

## 2016-07-18 DIAGNOSIS — E162 Hypoglycemia, unspecified: Secondary | ICD-10-CM | POA: Diagnosis not present

## 2016-07-18 DIAGNOSIS — E11649 Type 2 diabetes mellitus with hypoglycemia without coma: Secondary | ICD-10-CM | POA: Diagnosis not present

## 2016-07-18 DIAGNOSIS — Z992 Dependence on renal dialysis: Secondary | ICD-10-CM | POA: Diagnosis not present

## 2016-07-18 DIAGNOSIS — Z794 Long term (current) use of insulin: Secondary | ICD-10-CM | POA: Diagnosis not present

## 2016-07-18 MED ORDER — GABAPENTIN 300 MG PO CAPS: 300.0000 mg | ORAL_CAPSULE | Freq: Three times a day (TID) | ORAL | 0 refills | Status: DC

## 2016-07-20 ENCOUNTER — Telehealth: Payer: Self-pay | Admitting: Gastroenterology

## 2016-07-20 DIAGNOSIS — N186 End stage renal disease: Secondary | ICD-10-CM | POA: Diagnosis not present

## 2016-07-20 DIAGNOSIS — D509 Iron deficiency anemia, unspecified: Secondary | ICD-10-CM | POA: Diagnosis not present

## 2016-07-20 DIAGNOSIS — Z992 Dependence on renal dialysis: Secondary | ICD-10-CM | POA: Diagnosis not present

## 2016-07-20 DIAGNOSIS — Z794 Long term (current) use of insulin: Secondary | ICD-10-CM | POA: Diagnosis not present

## 2016-07-20 DIAGNOSIS — E11649 Type 2 diabetes mellitus with hypoglycemia without coma: Secondary | ICD-10-CM | POA: Diagnosis not present

## 2016-07-20 DIAGNOSIS — E162 Hypoglycemia, unspecified: Secondary | ICD-10-CM | POA: Diagnosis not present

## 2016-07-20 NOTE — Telephone Encounter (Signed)
Patient did not follow through with previous recommendations, labs, stools, ultrasound.  Please offer her a follow-up office visit.

## 2016-07-22 DIAGNOSIS — Z794 Long term (current) use of insulin: Secondary | ICD-10-CM | POA: Diagnosis not present

## 2016-07-22 DIAGNOSIS — D509 Iron deficiency anemia, unspecified: Secondary | ICD-10-CM | POA: Diagnosis not present

## 2016-07-22 DIAGNOSIS — E162 Hypoglycemia, unspecified: Secondary | ICD-10-CM | POA: Diagnosis not present

## 2016-07-22 DIAGNOSIS — Z992 Dependence on renal dialysis: Secondary | ICD-10-CM | POA: Diagnosis not present

## 2016-07-22 DIAGNOSIS — N186 End stage renal disease: Secondary | ICD-10-CM | POA: Diagnosis not present

## 2016-07-22 DIAGNOSIS — E11649 Type 2 diabetes mellitus with hypoglycemia without coma: Secondary | ICD-10-CM | POA: Diagnosis not present

## 2016-07-25 ENCOUNTER — Telehealth: Payer: Self-pay | Admitting: Family Medicine

## 2016-07-25 NOTE — Telephone Encounter (Signed)
Dr. Wendi Snipes is out but by the patient's electronic record she has been taking gabapentin 300 mg TID through the last few refills.

## 2016-07-25 NOTE — Telephone Encounter (Signed)
Pharmacist from [facility] called regarding high dosage of gabapentin for dialysis patient.

## 2016-07-26 ENCOUNTER — Encounter: Payer: Self-pay | Admitting: Gastroenterology

## 2016-07-26 NOTE — Telephone Encounter (Signed)
Please offer pt a follow up ov.

## 2016-07-26 NOTE — Telephone Encounter (Signed)
Appointment made and letter sent

## 2016-07-27 DIAGNOSIS — D509 Iron deficiency anemia, unspecified: Secondary | ICD-10-CM | POA: Diagnosis not present

## 2016-07-27 DIAGNOSIS — E11649 Type 2 diabetes mellitus with hypoglycemia without coma: Secondary | ICD-10-CM | POA: Diagnosis not present

## 2016-07-27 DIAGNOSIS — Z992 Dependence on renal dialysis: Secondary | ICD-10-CM | POA: Diagnosis not present

## 2016-07-27 DIAGNOSIS — E162 Hypoglycemia, unspecified: Secondary | ICD-10-CM | POA: Diagnosis not present

## 2016-07-27 DIAGNOSIS — N186 End stage renal disease: Secondary | ICD-10-CM | POA: Diagnosis not present

## 2016-07-27 DIAGNOSIS — Z794 Long term (current) use of insulin: Secondary | ICD-10-CM | POA: Diagnosis not present

## 2016-07-29 DIAGNOSIS — Z794 Long term (current) use of insulin: Secondary | ICD-10-CM | POA: Diagnosis not present

## 2016-07-29 DIAGNOSIS — N186 End stage renal disease: Secondary | ICD-10-CM | POA: Diagnosis not present

## 2016-07-29 DIAGNOSIS — Z992 Dependence on renal dialysis: Secondary | ICD-10-CM | POA: Diagnosis not present

## 2016-07-29 DIAGNOSIS — E11649 Type 2 diabetes mellitus with hypoglycemia without coma: Secondary | ICD-10-CM | POA: Diagnosis not present

## 2016-07-29 DIAGNOSIS — E162 Hypoglycemia, unspecified: Secondary | ICD-10-CM | POA: Diagnosis not present

## 2016-07-29 DIAGNOSIS — D509 Iron deficiency anemia, unspecified: Secondary | ICD-10-CM | POA: Diagnosis not present

## 2016-07-31 DIAGNOSIS — Z794 Long term (current) use of insulin: Secondary | ICD-10-CM | POA: Diagnosis not present

## 2016-07-31 DIAGNOSIS — Z23 Encounter for immunization: Secondary | ICD-10-CM | POA: Diagnosis not present

## 2016-07-31 DIAGNOSIS — D509 Iron deficiency anemia, unspecified: Secondary | ICD-10-CM | POA: Diagnosis not present

## 2016-07-31 DIAGNOSIS — N186 End stage renal disease: Secondary | ICD-10-CM | POA: Diagnosis not present

## 2016-07-31 DIAGNOSIS — E11649 Type 2 diabetes mellitus with hypoglycemia without coma: Secondary | ICD-10-CM | POA: Diagnosis not present

## 2016-07-31 DIAGNOSIS — E162 Hypoglycemia, unspecified: Secondary | ICD-10-CM | POA: Diagnosis not present

## 2016-07-31 DIAGNOSIS — Z992 Dependence on renal dialysis: Secondary | ICD-10-CM | POA: Diagnosis not present

## 2016-07-31 MED ORDER — GABAPENTIN 100 MG PO CAPS: 100.0000 mg | ORAL_CAPSULE | Freq: Three times a day (TID) | ORAL | 1 refills | Status: DC

## 2016-07-31 NOTE — Telephone Encounter (Signed)
Responded by paper as well as electronically. Decrease dose to 100 mg TID.   [name], MD [facility] 07/31/2016, 7:44 AM

## 2016-07-31 NOTE — Addendum Note (Signed)
Addended by: Timmothy Euler on: 07/31/2016 07:44 AM   Modules accepted: Orders

## 2016-08-02 ENCOUNTER — Other Ambulatory Visit: Payer: Medicare Other

## 2016-08-03 DIAGNOSIS — E162 Hypoglycemia, unspecified: Secondary | ICD-10-CM | POA: Diagnosis not present

## 2016-08-03 DIAGNOSIS — D509 Iron deficiency anemia, unspecified: Secondary | ICD-10-CM | POA: Diagnosis not present

## 2016-08-03 DIAGNOSIS — Z23 Encounter for immunization: Secondary | ICD-10-CM | POA: Diagnosis not present

## 2016-08-03 DIAGNOSIS — E11649 Type 2 diabetes mellitus with hypoglycemia without coma: Secondary | ICD-10-CM | POA: Diagnosis not present

## 2016-08-03 DIAGNOSIS — Z992 Dependence on renal dialysis: Secondary | ICD-10-CM | POA: Diagnosis not present

## 2016-08-03 DIAGNOSIS — N186 End stage renal disease: Secondary | ICD-10-CM | POA: Diagnosis not present

## 2016-08-06 DIAGNOSIS — N186 End stage renal disease: Secondary | ICD-10-CM | POA: Diagnosis not present

## 2016-08-06 DIAGNOSIS — D509 Iron deficiency anemia, unspecified: Secondary | ICD-10-CM | POA: Diagnosis not present

## 2016-08-06 DIAGNOSIS — E11649 Type 2 diabetes mellitus with hypoglycemia without coma: Secondary | ICD-10-CM | POA: Diagnosis not present

## 2016-08-06 DIAGNOSIS — Z992 Dependence on renal dialysis: Secondary | ICD-10-CM | POA: Diagnosis not present

## 2016-08-06 DIAGNOSIS — Z23 Encounter for immunization: Secondary | ICD-10-CM | POA: Diagnosis not present

## 2016-08-06 DIAGNOSIS — E162 Hypoglycemia, unspecified: Secondary | ICD-10-CM | POA: Diagnosis not present

## 2016-08-07 DIAGNOSIS — E113511 Type 2 diabetes mellitus with proliferative diabetic retinopathy with macular edema, right eye: Secondary | ICD-10-CM | POA: Diagnosis not present

## 2016-08-09 ENCOUNTER — Encounter (INDEPENDENT_AMBULATORY_CARE_PROVIDER_SITE_OTHER): Payer: Self-pay

## 2016-08-09 ENCOUNTER — Encounter: Payer: Self-pay | Admitting: Family Medicine

## 2016-08-09 ENCOUNTER — Ambulatory Visit (INDEPENDENT_AMBULATORY_CARE_PROVIDER_SITE_OTHER): Payer: Medicare Other | Admitting: Family Medicine

## 2016-08-09 ENCOUNTER — Ambulatory Visit (INDEPENDENT_AMBULATORY_CARE_PROVIDER_SITE_OTHER): Payer: Medicare Other

## 2016-08-09 VITALS — BP 122/73 | HR 57 | Temp 97.3°F | Ht 63.0 in | Wt 174.8 lb

## 2016-08-09 DIAGNOSIS — R05 Cough: Secondary | ICD-10-CM

## 2016-08-09 DIAGNOSIS — F39 Unspecified mood [affective] disorder: Secondary | ICD-10-CM | POA: Diagnosis not present

## 2016-08-09 DIAGNOSIS — R109 Unspecified abdominal pain: Secondary | ICD-10-CM

## 2016-08-09 DIAGNOSIS — R059 Cough, unspecified: Secondary | ICD-10-CM

## 2016-08-09 DIAGNOSIS — Z794 Long term (current) use of insulin: Secondary | ICD-10-CM

## 2016-08-09 DIAGNOSIS — E11319 Type 2 diabetes mellitus with unspecified diabetic retinopathy without macular edema: Secondary | ICD-10-CM | POA: Diagnosis not present

## 2016-08-09 DIAGNOSIS — IMO0001 Reserved for inherently not codable concepts without codable children: Secondary | ICD-10-CM

## 2016-08-09 LAB — MICROSCOPIC EXAMINATION: RENAL EPITHEL UA: NONE SEEN /HPF

## 2016-08-09 LAB — URINALYSIS, COMPLETE
Bilirubin, UA: NEGATIVE
LEUKOCYTES UA: NEGATIVE
Nitrite, UA: NEGATIVE
PH UA: 7 (ref 5.0–7.5)
Specific Gravity, UA: 1.02 (ref 1.005–1.030)
Urobilinogen, Ur: 0.2 mg/dL (ref 0.2–1.0)

## 2016-08-09 LAB — BAYER DCA HB A1C WAIVED: HB A1C (BAYER DCA - WAIVED): 6.9 % (ref <7.0)

## 2016-08-09 NOTE — Addendum Note (Signed)
Addended by: Karle Plumber on: 08/09/2016 02:36 PM   Modules accepted: Orders

## 2016-08-09 NOTE — Progress Notes (Signed)
HPI  Patient presents today or follow-up chronic medical conditions as well as abdominal pain and cough.  Diabetes Patient has very good medication compliance with Humalog, however she is not taking Lantus regularly. She states is been several weeks and she's taken it. She requests a talking glucose meter due to blindness. She denies any hypoglycemia. She's watching her diet moderately.  Cough Patient with cough and nasal congestion for about a week, no dyspnea. No fever, chills, sweats.  Abdominal pain Patient states that she has bilateral lower quadrant abdominal pain as well as dull achy back pain for about a month. She denies any dysuria or other signs of UTI. She has not had a UTI recently. She has been seen by GI on October with similar symptoms. She complains of alternating constipation and diarrhea. She has not followed through with laboratory workup that they have ordered, I've encouraged her to do this. She has a follow-up in about a week with them. She is tolerating food and fluids normally.   PMH: Smoking status noted ROS: Per HPI  Objective: BP 122/73   Pulse (!) 57   Temp 97.3 F (36.3 C) (Oral)   Ht 5' 3"  (1.6 m)   Wt 174 lb 12.8 oz (79.3 kg)   BMI 30.96 kg/m  Gen: NAD, alert, cooperative with exam HEENT: NCAT, discordant gaze CV: RRR, good S1/S2, no murmur Resp: CTABL, no wheezes, non-labored Abd: Soft, mild tenderness to palpation in bilateral lower quadrants without any guarding, positive bowel sounds Ext: No edema, warm Neuro: Alert and oriented  Depression screen [facility] 2/9 08/09/2016 04/11/2016 04/06/2016 12/13/2015 09/22/2015  Decreased Interest 0 0 0 0 1  Down, Depressed, Hopeless 0 0 0 0 1  PHQ - 2 Score 0 0 0 0 2  Altered sleeping - - - - 1  Tired, decreased energy - - - - 1  Change in appetite - - - - 0  Feeling bad or failure about yourself  - - - - 1  Trouble concentrating - - - - 0  Moving slowly or fidgety/restless - - - - 0  Suicidal  thoughts - - - - 0  PHQ-9 Score - - - - 5  Difficult doing work/chores - - - - -     Assessment and plan:  # Type 2 diabetes Controlled with A1c of 6.9, discontinue Lantus. Continue Humalog 7 units at meals Consider endocrinology referral if controls left or if starts to develop hypoglycemia.  # Cough Likely viral cough versus postnasal drip Respiratory exam reassuring, plain films today to rule out pneumonia  # Abd pain Unclear etiology, patient does appear to be slightly distended today, she missed hemodialysis 1 day ago which is likely a good explanation for this. Recommended follow through with labs for GI and follow-up as planned. GI Notes hepatosplenomegaly on their exam - appreciate recommendations and management  UA not consistent with UTI, Culture  # Mood Disorder C/o anxiety and requesting Psych referral. Pt concerned about BPD Refer to [facility]      Orders Placed This Encounter  Procedures  . Microscopic Examination  . DG Chest 2 View    Standing Status:   Future    Number of Occurrences:   1    Standing Expiration Date:   10/07/2017    Order Specific Question:   Reason for Exam (SYMPTOM  OR DIAGNOSIS REQUIRED)    Answer:   Cough, Eval for CAP    Order Specific Question:   Is  patient pregnant?    Answer:   No    Order Specific Question:   Preferred imaging location?    Answer:   Internal  . Bayer DCA Hb A1c Waived  . Microalbumin / creatinine urine ratio  . Urinalysis, Complete  . Ambulatory referral to Psychiatry    Referral Priority:   Routine    Referral Type:   Psychiatric    Referral Reason:   Specialty Services Required    Requested Specialty:   Psychiatry    Number of Visits Requested:   [place], MD [facility] 08/09/2016, 12:12 PM

## 2016-08-09 NOTE — Patient Instructions (Addendum)
Great to see you!  Come back in 3 months unless you need [place] ooner.  Stop Lantus.   Your provider wants you to schedule an appointment with a Psychologist/Psychiatrist. The following list of offices requires the patient to call and make their own appointment, as there is information they need that only you can provide. Please feel free to choose form the following providers:  [place] in [place]  [place]  [phone] [place], [place]  (Scheduled through [place]) Must call and do an interview for appointment. Sees Children / Accepts Medicaid  [facility] [place]  [address], [place]    [place], [place]  [phone] [place], [place] for Autism but does not treat it Sees Children / Accepts Medicaid  [facility]    [phone] [address], [place], [place] Medication management, substance abuse, bipolar, grief, family, marriage, OCD, anxiety, PTSD Sees children / Accepts Medicaid  [facility]    [phone] [address] [place], [place] children / Accepts [facility]  [facility]  [phone] [address] [place], [place]   Dr Lorenza Evangelist     [phone] [address], [place], [place]  Sees ADD & ADHD for treatment Accepts Medicaid  [place]  [phone] [address] Arlean Hopping, [place] for Autism Accepts [facility]  [facility]   [phone] [place], [place]  Does Adult ADD evaluations Does not accept Medicaid  [facility]   [phone] [place], [place] therapy  Sees children as young as 59 years old Accepts Medicaid

## 2016-08-10 DIAGNOSIS — E11649 Type 2 diabetes mellitus with hypoglycemia without coma: Secondary | ICD-10-CM | POA: Diagnosis not present

## 2016-08-10 DIAGNOSIS — Z23 Encounter for immunization: Secondary | ICD-10-CM | POA: Diagnosis not present

## 2016-08-10 DIAGNOSIS — N186 End stage renal disease: Secondary | ICD-10-CM | POA: Diagnosis not present

## 2016-08-10 DIAGNOSIS — D509 Iron deficiency anemia, unspecified: Secondary | ICD-10-CM | POA: Diagnosis not present

## 2016-08-10 DIAGNOSIS — E162 Hypoglycemia, unspecified: Secondary | ICD-10-CM | POA: Diagnosis not present

## 2016-08-10 DIAGNOSIS — Z992 Dependence on renal dialysis: Secondary | ICD-10-CM | POA: Diagnosis not present

## 2016-08-13 ENCOUNTER — Telehealth: Payer: Self-pay | Admitting: Family Medicine

## 2016-08-14 DIAGNOSIS — D509 Iron deficiency anemia, unspecified: Secondary | ICD-10-CM | POA: Diagnosis not present

## 2016-08-14 DIAGNOSIS — N186 End stage renal disease: Secondary | ICD-10-CM | POA: Diagnosis not present

## 2016-08-14 DIAGNOSIS — E11649 Type 2 diabetes mellitus with hypoglycemia without coma: Secondary | ICD-10-CM | POA: Diagnosis not present

## 2016-08-14 DIAGNOSIS — Z992 Dependence on renal dialysis: Secondary | ICD-10-CM | POA: Diagnosis not present

## 2016-08-14 DIAGNOSIS — E162 Hypoglycemia, unspecified: Secondary | ICD-10-CM | POA: Diagnosis not present

## 2016-08-14 DIAGNOSIS — Z23 Encounter for immunization: Secondary | ICD-10-CM | POA: Diagnosis not present

## 2016-08-14 LAB — MICROALBUMIN / CREATININE URINE RATIO: Creatinine, Urine: 141.4 mg/dL

## 2016-08-16 ENCOUNTER — Ambulatory Visit: Payer: Medicare Other | Admitting: Gastroenterology

## 2016-08-17 DIAGNOSIS — N186 End stage renal disease: Secondary | ICD-10-CM | POA: Diagnosis not present

## 2016-08-17 DIAGNOSIS — Z23 Encounter for immunization: Secondary | ICD-10-CM | POA: Diagnosis not present

## 2016-08-17 DIAGNOSIS — E162 Hypoglycemia, unspecified: Secondary | ICD-10-CM | POA: Diagnosis not present

## 2016-08-17 DIAGNOSIS — D509 Iron deficiency anemia, unspecified: Secondary | ICD-10-CM | POA: Diagnosis not present

## 2016-08-17 DIAGNOSIS — E11649 Type 2 diabetes mellitus with hypoglycemia without coma: Secondary | ICD-10-CM | POA: Diagnosis not present

## 2016-08-17 DIAGNOSIS — Z992 Dependence on renal dialysis: Secondary | ICD-10-CM | POA: Diagnosis not present

## 2016-08-20 DIAGNOSIS — N186 End stage renal disease: Secondary | ICD-10-CM | POA: Diagnosis not present

## 2016-08-20 DIAGNOSIS — E11649 Type 2 diabetes mellitus with hypoglycemia without coma: Secondary | ICD-10-CM | POA: Diagnosis not present

## 2016-08-20 DIAGNOSIS — Z23 Encounter for immunization: Secondary | ICD-10-CM | POA: Diagnosis not present

## 2016-08-20 DIAGNOSIS — D509 Iron deficiency anemia, unspecified: Secondary | ICD-10-CM | POA: Diagnosis not present

## 2016-08-20 DIAGNOSIS — E162 Hypoglycemia, unspecified: Secondary | ICD-10-CM | POA: Diagnosis not present

## 2016-08-20 DIAGNOSIS — Z992 Dependence on renal dialysis: Secondary | ICD-10-CM | POA: Diagnosis not present

## 2016-08-21 DIAGNOSIS — R05 Cough: Secondary | ICD-10-CM | POA: Diagnosis not present

## 2016-08-21 DIAGNOSIS — Z7982 Long term (current) use of aspirin: Secondary | ICD-10-CM | POA: Diagnosis not present

## 2016-08-21 DIAGNOSIS — Z79899 Other long term (current) drug therapy: Secondary | ICD-10-CM | POA: Diagnosis not present

## 2016-08-21 DIAGNOSIS — E1122 Type 2 diabetes mellitus with diabetic chronic kidney disease: Secondary | ICD-10-CM | POA: Diagnosis not present

## 2016-08-21 DIAGNOSIS — N189 Chronic kidney disease, unspecified: Secondary | ICD-10-CM | POA: Diagnosis not present

## 2016-08-21 DIAGNOSIS — I509 Heart failure, unspecified: Secondary | ICD-10-CM | POA: Diagnosis not present

## 2016-08-21 DIAGNOSIS — I130 Hypertensive heart and chronic kidney disease with heart failure and stage 1 through stage 4 chronic kidney disease, or unspecified chronic kidney disease: Secondary | ICD-10-CM | POA: Diagnosis not present

## 2016-08-21 DIAGNOSIS — E1140 Type 2 diabetes mellitus with diabetic neuropathy, unspecified: Secondary | ICD-10-CM | POA: Diagnosis not present

## 2016-08-21 DIAGNOSIS — Z794 Long term (current) use of insulin: Secondary | ICD-10-CM | POA: Diagnosis not present

## 2016-08-21 DIAGNOSIS — R0602 Shortness of breath: Secondary | ICD-10-CM | POA: Diagnosis not present

## 2016-08-21 DIAGNOSIS — F17210 Nicotine dependence, cigarettes, uncomplicated: Secondary | ICD-10-CM | POA: Diagnosis not present

## 2016-08-21 DIAGNOSIS — R079 Chest pain, unspecified: Secondary | ICD-10-CM | POA: Diagnosis not present

## 2016-08-22 ENCOUNTER — Telehealth (HOSPITAL_COMMUNITY): Payer: Self-pay | Admitting: *Deleted

## 2016-08-22 DIAGNOSIS — D509 Iron deficiency anemia, unspecified: Secondary | ICD-10-CM | POA: Diagnosis not present

## 2016-08-22 DIAGNOSIS — E11649 Type 2 diabetes mellitus with hypoglycemia without coma: Secondary | ICD-10-CM | POA: Diagnosis not present

## 2016-08-22 DIAGNOSIS — E162 Hypoglycemia, unspecified: Secondary | ICD-10-CM | POA: Diagnosis not present

## 2016-08-22 DIAGNOSIS — Z992 Dependence on renal dialysis: Secondary | ICD-10-CM | POA: Diagnosis not present

## 2016-08-22 DIAGNOSIS — N186 End stage renal disease: Secondary | ICD-10-CM | POA: Diagnosis not present

## 2016-08-22 DIAGNOSIS — Z23 Encounter for immunization: Secondary | ICD-10-CM | POA: Diagnosis not present

## 2016-08-22 NOTE — Telephone Encounter (Signed)
left voice message regarding an appointment. 

## 2016-08-24 DIAGNOSIS — Z23 Encounter for immunization: Secondary | ICD-10-CM | POA: Diagnosis not present

## 2016-08-24 DIAGNOSIS — E162 Hypoglycemia, unspecified: Secondary | ICD-10-CM | POA: Diagnosis not present

## 2016-08-24 DIAGNOSIS — N186 End stage renal disease: Secondary | ICD-10-CM | POA: Diagnosis not present

## 2016-08-24 DIAGNOSIS — Z992 Dependence on renal dialysis: Secondary | ICD-10-CM | POA: Diagnosis not present

## 2016-08-24 DIAGNOSIS — E11649 Type 2 diabetes mellitus with hypoglycemia without coma: Secondary | ICD-10-CM | POA: Diagnosis not present

## 2016-08-24 DIAGNOSIS — D509 Iron deficiency anemia, unspecified: Secondary | ICD-10-CM | POA: Diagnosis not present

## 2016-08-27 DIAGNOSIS — Z992 Dependence on renal dialysis: Secondary | ICD-10-CM | POA: Diagnosis not present

## 2016-08-27 DIAGNOSIS — D509 Iron deficiency anemia, unspecified: Secondary | ICD-10-CM | POA: Diagnosis not present

## 2016-08-27 DIAGNOSIS — N186 End stage renal disease: Secondary | ICD-10-CM | POA: Diagnosis not present

## 2016-08-27 DIAGNOSIS — E11649 Type 2 diabetes mellitus with hypoglycemia without coma: Secondary | ICD-10-CM | POA: Diagnosis not present

## 2016-08-27 DIAGNOSIS — E162 Hypoglycemia, unspecified: Secondary | ICD-10-CM | POA: Diagnosis not present

## 2016-08-27 DIAGNOSIS — Z23 Encounter for immunization: Secondary | ICD-10-CM | POA: Diagnosis not present

## 2016-08-29 ENCOUNTER — Other Ambulatory Visit: Payer: Self-pay | Admitting: Family Medicine

## 2016-08-29 ENCOUNTER — Telehealth: Payer: Self-pay | Admitting: Family Medicine

## 2016-08-29 DIAGNOSIS — E11649 Type 2 diabetes mellitus with hypoglycemia without coma: Secondary | ICD-10-CM | POA: Diagnosis not present

## 2016-08-29 DIAGNOSIS — Z23 Encounter for immunization: Secondary | ICD-10-CM | POA: Diagnosis not present

## 2016-08-29 DIAGNOSIS — E162 Hypoglycemia, unspecified: Secondary | ICD-10-CM | POA: Diagnosis not present

## 2016-08-29 DIAGNOSIS — Z992 Dependence on renal dialysis: Secondary | ICD-10-CM | POA: Diagnosis not present

## 2016-08-29 DIAGNOSIS — N186 End stage renal disease: Secondary | ICD-10-CM | POA: Diagnosis not present

## 2016-08-29 DIAGNOSIS — D509 Iron deficiency anemia, unspecified: Secondary | ICD-10-CM | POA: Diagnosis not present

## 2016-08-29 MED ORDER — PRODIGY VOICE BLOOD GLUCOSE W/DEVICE KIT: 1.0000 | PACK | 0 refills | Status: DC | PRN

## 2016-08-29 MED ORDER — GLUCOSE BLOOD VI STRP: ORAL_STRIP | 12 refills | Status: DC

## 2016-08-29 NOTE — Telephone Encounter (Signed)
Pt is blind and requested a talking glucose monitor. I have pended orders and ask nursing to investigate where she would like it sent.   [name], MD [facility] 08/29/2016, 12:35 PM

## 2016-08-31 DIAGNOSIS — Z794 Long term (current) use of insulin: Secondary | ICD-10-CM | POA: Diagnosis not present

## 2016-08-31 DIAGNOSIS — Z992 Dependence on renal dialysis: Secondary | ICD-10-CM | POA: Diagnosis not present

## 2016-08-31 DIAGNOSIS — E162 Hypoglycemia, unspecified: Secondary | ICD-10-CM | POA: Diagnosis not present

## 2016-08-31 DIAGNOSIS — D509 Iron deficiency anemia, unspecified: Secondary | ICD-10-CM | POA: Diagnosis not present

## 2016-08-31 DIAGNOSIS — N186 End stage renal disease: Secondary | ICD-10-CM | POA: Diagnosis not present

## 2016-08-31 DIAGNOSIS — D631 Anemia in chronic kidney disease: Secondary | ICD-10-CM | POA: Diagnosis not present

## 2016-08-31 DIAGNOSIS — E11649 Type 2 diabetes mellitus with hypoglycemia without coma: Secondary | ICD-10-CM | POA: Diagnosis not present

## 2016-08-31 MED ORDER — PRODIGY VOICE BLOOD GLUCOSE W/DEVICE KIT: 1.0000 | PACK | Freq: Two times a day (BID) | 0 refills | Status: DC

## 2016-08-31 NOTE — Telephone Encounter (Signed)
Order changed and re-sent

## 2016-09-04 DIAGNOSIS — Z992 Dependence on renal dialysis: Secondary | ICD-10-CM | POA: Diagnosis not present

## 2016-09-04 DIAGNOSIS — D631 Anemia in chronic kidney disease: Secondary | ICD-10-CM | POA: Diagnosis not present

## 2016-09-04 DIAGNOSIS — D509 Iron deficiency anemia, unspecified: Secondary | ICD-10-CM | POA: Diagnosis not present

## 2016-09-04 DIAGNOSIS — E11649 Type 2 diabetes mellitus with hypoglycemia without coma: Secondary | ICD-10-CM | POA: Diagnosis not present

## 2016-09-04 DIAGNOSIS — N186 End stage renal disease: Secondary | ICD-10-CM | POA: Diagnosis not present

## 2016-09-04 DIAGNOSIS — E162 Hypoglycemia, unspecified: Secondary | ICD-10-CM | POA: Diagnosis not present

## 2016-09-05 ENCOUNTER — Encounter: Payer: Self-pay | Admitting: Family Medicine

## 2016-09-05 ENCOUNTER — Ambulatory Visit (INDEPENDENT_AMBULATORY_CARE_PROVIDER_SITE_OTHER): Payer: Medicare Other | Admitting: Family Medicine

## 2016-09-05 VITALS — BP 143/74 | HR 63 | Temp 97.2°F | Ht 63.0 in | Wt 172.0 lb

## 2016-09-05 DIAGNOSIS — J340 Abscess, furuncle and carbuncle of nose: Secondary | ICD-10-CM

## 2016-09-05 DIAGNOSIS — R0981 Nasal congestion: Secondary | ICD-10-CM

## 2016-09-05 MED ORDER — FLUTICASONE PROPIONATE 50 MCG/ACT NA SUSP: 2.0000 | Freq: Every day | NASAL | 11 refills | Status: DC | PRN

## 2016-09-05 MED ORDER — CEPHALEXIN 500 MG PO CAPS: 500.0000 mg | ORAL_CAPSULE | Freq: Four times a day (QID) | ORAL | 0 refills | Status: DC

## 2016-09-05 NOTE — Addendum Note (Signed)
Addended by: Shelbie Ammons on: 09/05/2016 02:19 PM   Modules accepted: Orders

## 2016-09-05 NOTE — Progress Notes (Signed)
BP (!) 143/74   Pulse 63   Temp 97.2 F (36.2 C) (Oral)   Ht _0  (1.6 m)   Wt 172 lb (78 kg)   BMI 30.47 kg/m    Subjective:    Patient ID: Kara Knapp, female    DOB: Nov 01, 1970, 45 y.o.   MRN: [id number]  HPI: AUBERY DATE is a 46 y.o. female presenting on 09/05/2016 for No chief complaint on file.   HPI Kara Knapp is a 46 year old female presenting with a painful bump in her right nostril for the last 2 days.  She says it is both sore to the touch and aches without touching it.  Her pain also extends to under her right eye and the right eye itself.  She also said last night she felt like it was swollen and limited her ability to breath through that nostril. She tried squeezing it to see if it would drain, and she thinks it may have drained a little, but not much.  She has tried tylenol for her pain without much relief.  She has also been struggling with sinus issues the past month, so she has been blowing her nose and coughing up phlegm frequently.  She denies and fevers, chills, or bleeding from her nose.   Relevant past medical, surgical, family and social history reviewed and updated as indicated. Interim medical history since our last visit reviewed. Allergies and medications reviewed and updated.  Review of Systems  Constitutional: Negative for chills and fever.  HENT: Positive for congestion, facial swelling, rhinorrhea and sinus pain. Negative for ear discharge and ear pain.   Eyes: Positive for pain.  Respiratory: Positive for cough. Negative for shortness of breath and wheezing.   Gastrointestinal: Negative for abdominal pain, diarrhea, nausea and vomiting.    Per HPI unless specifically indicated above   Allergies as of 09/05/2016      Reactions   Bactrim [sulfamethoxazole-trimethoprim] Nausea And Vomiting   Prednisone Other (See Comments)   "I was wide open and couldn't eat" per pt.       Medication List       Accurate as of 09/05/16 10:29 AM. Always use  your most recent med list.          aspirin 81 MG tablet Take 81 mg by mouth daily.   blood glucose meter kit and supplies Kit Podigy Voice GLucometer - patient is visually impaired and needs meter that reads results.  Use to check BG up to QID.  Dx: E11.319 Z79.4   blood glucose meter kit and supplies Kit Dispense based on patient and insurance preference. Use up to four times daily as directed. (FOR ICD-9 250.00, 250.01).   calcium acetate 667 MG capsule Commonly known as:  PHOSLO Take 1,334 mg by mouth 3 (three) times daily with meals.   carvedilol 12.5 MG tablet Commonly known as:  COREG Take 1 tablet (12.5 mg total) by mouth 2 (two) times daily.   cephALEXin 500 MG capsule Commonly known as:  KEFLEX Take 1 capsule (500 mg total) by mouth 4 (four) times daily.   cholecalciferol 1000 units tablet Commonly known as:  VITAMIN D Take 1,000 Units by mouth daily.   citalopram 20 MG tablet Commonly known as:  CELEXA Take 1 tablet (20 mg total) by mouth daily. Reported on 08/12/2015   fluticasone 50 MCG/ACT nasal spray Commonly known as:  FLONASE Place 2 sprays into both nostrils daily as needed for allergies.   gabapentin 100  MG capsule Commonly known as:  NEURONTIN Take 1 capsule (100 mg total) by mouth 3 (three) times daily.   glucose blood test strip Use as instructed   hydrALAZINE 25 MG tablet Commonly known as:  APRESOLINE Take 1 tablet (25 mg total) by mouth 3 (three) times daily.   insulin lispro 100 UNIT/ML KiwkPen Commonly known as:  HUMALOG KWIKPEN INJECT 7-10 UNITS INTO THE SKIN THREE TIMES DAILY.   PRODIGY VOICE BLOOD GLUCOSE w/Device Kit 1 Device by Does not apply route 2 (two) times daily.   sevelamer carbonate 800 MG tablet Commonly known as:  RENVELA Take 1,600 mg by mouth 3 (three) times daily with meals. And with snacks.          Objective:    BP (!) 143/74   Pulse 63   Temp 97.2 F (36.2 C) (Oral)   Ht _0  (1.6 m)   Wt 172 lb (78  kg)   BMI 30.47 kg/m   Wt Readings from Last 3 Encounters:  09/05/16 172 lb (78 kg)  08/09/16 174 lb 12.8 oz (79.3 kg)  06/07/16 174 lb 12.8 oz (79.3 kg)    Physical Exam  Constitutional: She appears well-developed and well-nourished. She is cooperative. No distress.  HENT:  Right Ear: External ear and ear canal normal. No drainage. Tympanic membrane is not bulging. No middle ear effusion.  Left Ear: External ear and ear canal normal. No drainage. Tympanic membrane is bulging.  No middle ear effusion.  Nose: Rhinorrhea, sinus tenderness and nasal deformity present. Right sinus exhibits maxillary sinus tenderness. Right sinus exhibits no frontal sinus tenderness. Left sinus exhibits maxillary sinus tenderness. Left sinus exhibits no frontal sinus tenderness.  Mouth/Throat: Oropharynx is clear and moist and mucous membranes are normal.  Right nostril tender with slight edema visible externally. Inside right nostril is a red, raised pustule protruding from lateral wall towards the septum.   Cardiovascular: Normal rate, regular rhythm and normal heart sounds.   Pulmonary/Chest: Effort normal and breath sounds normal. She has no wheezes.  Lymphadenopathy:       Head (right side): No submental, no submandibular, no tonsillar, no preauricular, no posterior auricular and no occipital adenopathy present.       Head (left side): No submental, no submandibular, no tonsillar, no preauricular, no posterior auricular and no occipital adenopathy present.    She has no cervical adenopathy.      Assessment & Plan:   Problem List Items Addressed This Visit    None    Visit Diagnoses    Cellulitis of nasal tip    -  Primary   Relevant Medications   cephALEXin (KEFLEX) 500 MG capsule   Sinus congestion       Relevant Medications   fluticasone (FLONASE) 50 MCG/ACT nasal spray      Follow up plan: Return if symptoms worsen or fail to improve. Try using warm compresses a couple times per day to  decrease pain and promote drainage.   Counseling provided for all of the vaccine components No orders of the defined types were placed in this encounter.   Patient seen and examined with Arlana Lindau PA student, agree with assessment and plan.  Caryl Pina, MD [facility] 09/05/2016, 11:24 AM

## 2016-09-06 ENCOUNTER — Other Ambulatory Visit: Payer: Self-pay

## 2016-09-06 ENCOUNTER — Telehealth: Payer: Self-pay | Admitting: *Deleted

## 2016-09-06 ENCOUNTER — Ambulatory Visit (INDEPENDENT_AMBULATORY_CARE_PROVIDER_SITE_OTHER): Payer: Medicare Other

## 2016-09-06 ENCOUNTER — Telehealth: Payer: Self-pay

## 2016-09-06 DIAGNOSIS — R0602 Shortness of breath: Secondary | ICD-10-CM

## 2016-09-06 DIAGNOSIS — H548 Legal blindness, as defined in USA: Secondary | ICD-10-CM

## 2016-09-06 NOTE — Telephone Encounter (Signed)
Will ask clinical pharmacist for opinion.   [name], MD [facility] 09/06/2016, 12:59 PM

## 2016-09-06 NOTE — Telephone Encounter (Signed)
Notes Recorded by Laurine Blazer, LPN on 09/28/1214 at 2:44 PM EST Patient notified and verbalized understanding.   ------  Notes Recorded by Satira Sark, MD on 09/06/2016 at 12:36 PM EST Results reviewed. LVEF is normal but there is moderate diastolic dysfunction as well as moderately elevated pulmonary pressures. These are issues that could have been associated with her previous volume overload complicated by renal insufficiency. She was doing well at the recent visit we will continue with current plan. A copy of this test should be forwarded to Kenn File, MD.

## 2016-09-06 NOTE — Telephone Encounter (Signed)
Is there any other diagnosis you a can include - IE is patient legally blind in either eye or just diabetic retinopathy? Also she could try [facility].  At one time they had a diabetes specialist on staff and they might be able to help get the Prodig[employer] voice meter covered. They also might be able to process on patient Medicare Part B instead of Part D however patient might have to pay 20% of cost.

## 2016-09-07 ENCOUNTER — Ambulatory Visit (INDEPENDENT_AMBULATORY_CARE_PROVIDER_SITE_OTHER): Payer: Medicare Other | Admitting: Gastroenterology

## 2016-09-07 ENCOUNTER — Encounter: Payer: Self-pay | Admitting: Gastroenterology

## 2016-09-07 ENCOUNTER — Telehealth: Payer: Self-pay | Admitting: Family Medicine

## 2016-09-07 VITALS — BP 148/78 | HR 62 | Temp 97.4°F | Ht 63.0 in | Wt 176.4 lb

## 2016-09-07 DIAGNOSIS — R7989 Other specified abnormal findings of blood chemistry: Secondary | ICD-10-CM

## 2016-09-07 DIAGNOSIS — D638 Anemia in other chronic diseases classified elsewhere: Secondary | ICD-10-CM | POA: Diagnosis not present

## 2016-09-07 DIAGNOSIS — R945 Abnormal results of liver function studies: Secondary | ICD-10-CM

## 2016-09-07 DIAGNOSIS — R1319 Other dysphagia: Secondary | ICD-10-CM

## 2016-09-07 DIAGNOSIS — R195 Other fecal abnormalities: Secondary | ICD-10-CM

## 2016-09-07 DIAGNOSIS — K529 Noninfective gastroenteritis and colitis, unspecified: Secondary | ICD-10-CM | POA: Diagnosis not present

## 2016-09-07 DIAGNOSIS — D509 Iron deficiency anemia, unspecified: Secondary | ICD-10-CM | POA: Diagnosis not present

## 2016-09-07 DIAGNOSIS — R1310 Dysphagia, unspecified: Secondary | ICD-10-CM | POA: Diagnosis not present

## 2016-09-07 DIAGNOSIS — D631 Anemia in chronic kidney disease: Secondary | ICD-10-CM | POA: Diagnosis not present

## 2016-09-07 DIAGNOSIS — R160 Hepatomegaly, not elsewhere classified: Secondary | ICD-10-CM

## 2016-09-07 DIAGNOSIS — K625 Hemorrhage of anus and rectum: Secondary | ICD-10-CM | POA: Insufficient documentation

## 2016-09-07 DIAGNOSIS — E162 Hypoglycemia, unspecified: Secondary | ICD-10-CM | POA: Diagnosis not present

## 2016-09-07 DIAGNOSIS — N186 End stage renal disease: Secondary | ICD-10-CM | POA: Diagnosis not present

## 2016-09-07 DIAGNOSIS — Z992 Dependence on renal dialysis: Secondary | ICD-10-CM | POA: Diagnosis not present

## 2016-09-07 DIAGNOSIS — E11649 Type 2 diabetes mellitus with hypoglycemia without coma: Secondary | ICD-10-CM | POA: Diagnosis not present

## 2016-09-07 NOTE — Assessment & Plan Note (Signed)
Solid food dysphagia. Consider upper endoscopy with dilation with deep sedation in the OR. Patient wants to wait until labs and ultrasound are complete.  I have discussed the risks, alternatives, benefits with regards to but not limited to the risk of reaction to medication, bleeding, infection, perforation and the patient is agreeable to proceed. Written consent to be obtained.

## 2016-09-07 NOTE — Assessment & Plan Note (Signed)
46 year old female with several year history of chronic diarrhea, now with some intermittent formed to hard stools. No previous workup. Intermittent bright red blood per rectum and heme positive stools documented. Patient refused collection of stool for stool studies stating that she's not able to do it herself and does not have any assistance. Screen for celiac disease. We'll plan on colonoscopy once labs and ultrasound were performed. Deep sedation in the OR given polypharmacy.  I have discussed the risks, alternatives, benefits with regards to but not limited to the risk of reaction to medication, bleeding, infection, perforation and the patient is agreeable to proceed. Written consent to be obtained.  Patient wants to schedule after her ultrasound and labs are complete.

## 2016-09-07 NOTE — Assessment & Plan Note (Signed)
Likely anemia of chronic disease. Labs planned. She is heme positive with intermittent rectal bleeding therefore will need to have further workup. Patient requests that we hold on schedule and into her labs and ultrasound complete. Plan for colonoscopy with upper endoscopy in the near future.

## 2016-09-07 NOTE — Telephone Encounter (Signed)
RX cancelled with mail order

## 2016-09-07 NOTE — Progress Notes (Addendum)
REVIEWED-NO ADDITIONAL RECOMMENDATIONS.  Primary Care Physician: Kenn File, MD  Primary Gastroenterologist:  Barney Drain, MD   Chief Complaint  Patient presents with  . Constipation    Diarrhea to constipation    HPI: Kara Knapp is a 46 y.o. female here for follow-up. She was seen back in October 2017 as a new patient referral for bowel incontinence/diarrhea. Complained of intermittent explosive stools, gas. Symptoms worse since cholecystectomy 2014. Intermittent bright red blood per rectum, rectal pain with all bowel movements. Also complained of abdominal cramping better with BMs, dysphagia with solid food. No prior colonoscopy or EGD. He also has a history of abnormal LFTs, specifically alkaline phosphatase, AST, ALT. Hepatomegaly on exam. Currently receiving hepatitis B vaccines with dialysis. Mild anemia, heme positive stools in the setting of chronic renal disease, diabetes. At time of last office visit, we had plans for labs, stool studies, ultrasound of liver. Unfortunately she did not get these done. After these test were done we were planning on colonoscopy and endoscopy as appropriate.   Patient states that overall her symptoms are unchanged although she has had some intermittent constipation now. She does have some stool every day sometimes hard. She is not sure if this is related to Imodium use. She takes Imodium predominantly on her dialysis days. She will take it even if she hasn't had a stool that day because she is scared of having diarrhea while at dialysis. Never has watery stools, stools are always very loose however when she does have problems. Intermittent bright red blood per rectum. Lots of fecal incontinence during dialysis. At one point she was really watching her diet, avoiding greasy/fatty foods and symptoms have somewhat improved bowel habits.  Trying to get back involved with a therapist/psychiatrist, Faith and family. Hasn't seen since she started  dialysis last summer.    Patient states she did not have her labs and ultrasound done previously because she depends on transportation and is not familiar with [facility] scared to try to navigate around there by herself given her visual issues. She will like to have her stuff done at [facility]. She states she's not able to collect stool for studies and does not plan to try. Her son is 54, they live together and she reveals, he is on disability. She states that he does not provide any support with trying to get her medical issues dealt with.  Current Outpatient Prescriptions  Medication Sig Dispense Refill  . aspirin 81 MG tablet Take 81 mg by mouth daily.    . carvedilol (COREG) 12.5 MG tablet Take 1 tablet (12.5 mg total) by mouth 2 (two) times daily. 180 tablet 1  . cephALEXin (KEFLEX) 500 MG capsule Take 1 capsule (500 mg total) by mouth 4 (four) times daily. 28 capsule 0  . cholecalciferol (VITAMIN D) 1000 units tablet Take 1,000 Units by mouth daily.    . citalopram (CELEXA) 20 MG tablet Take 1 tablet (20 mg total) by mouth daily. Reported on 08/12/2015 90 tablet 1  . fluticasone (FLONASE) 50 MCG/ACT nasal spray Place 2 sprays into both nostrils daily as needed for allergies. 16 g 11  . gabapentin (NEURONTIN) 100 MG capsule Take 1 capsule (100 mg total) by mouth 3 (three) times daily. 270 capsule 1  . hydrALAZINE (APRESOLINE) 25 MG tablet Take 1 tablet (25 mg total) by mouth 3 (three) times daily. (Patient taking differently: Take 25 mg by mouth 2 (two) times daily. ) 90 tablet 1  .  insulin lispro (HUMALOG KWIKPEN) 100 UNIT/ML KiwkPen INJECT 7-10 UNITS INTO THE SKIN THREE TIMES DAILY. 45 mL 3  . sevelamer carbonate (RENVELA) 800 MG tablet Take 1,600 mg by mouth 3 (three) times daily with meals. And with snacks.    . blood glucose meter kit and supplies KIT Podigy Voice GLucometer - patient is visually impaired and needs meter that reads results.  Use to check BG up to  QID.  Dx: E11.319 Z79.4 1 each 0  . blood glucose meter kit and supplies KIT Dispense based on patient and insurance preference. Use up to four times daily as directed. (FOR ICD-9 250.00, 250.01). 1 each 0  . Blood Glucose Monitoring Suppl (PRODIGY VOICE BLOOD GLUCOSE) w/Device KIT 1 Device by Does not apply route 2 (two) times daily. 1 kit 0  . glucose blood test strip Use as instructed 100 each 12   No current facility-administered medications for this visit.     Allergies as of 09/07/2016 - Review Complete 09/07/2016  Allergen Reaction Noted  . Bactrim [sulfamethoxazole-trimethoprim] Nausea And Vomiting 05/21/2013  . Prednisone Other (See Comments) 07/09/2013   Past Medical History:  Diagnosis Date  . Anemia of chronic disease   . Asthma   . Blind left eye   . Bronchitis   . Cataract   . Cholecystitis, acute 05/26/2013   Status post cholecystectomy  . Depression   . Diabetic foot ulcer ([place]) 03/01/2015  . ESRD on hemodialysis ([place])    Started diaylsis 12/29/15, T, TH, Sat  . Essential hypertension   . Fibroids   . Glaucoma   . History of blood transfusion   . History of pneumonia   . Hyperlipidemia   . Insulin-dependent diabetes mellitus with retinopathy ([place])   . Osteomyelitis ([place])    Toe on left foot   Past Surgical History:  Procedure Laterality Date  . AV FISTULA PLACEMENT Right 10/17/2015   Procedure: INSERTION OF ARTERIOVENOUS GORE-TEX GRAFT RIGHT UPPER ARM WITH ACUSEAL;  Surgeon: Conrad Cotesfield, MD;  Location: [facility];  Service: Vascular;  Laterality: Right;  . CATARACT EXTRACTION W/ INTRAOCULAR LENS IMPLANT Bilateral   . CESAREAN SECTION    . CHOLECYSTECTOMY N/A 05/25/2013   Procedure: LAPAROSCOPIC CHOLECYSTECTOMY;  Surgeon: Jamesetta So, MD;  Location: [facility];  Service: General;  Laterality: N/A;  . EYE SURGERY    . PARS PLANA VITRECTOMY Left 11/24/2014   Procedure: PARS PLANA VITRECTOMY WITH 25 GAUGE;  Surgeon: Hurman Horn, MD;  Location: [facility];  Service:  Ophthalmology;  Laterality: Left;  . PERIPHERAL VASCULAR CATHETERIZATION N/A 04/28/2015   Procedure: Bilateral Upper Extremity Venography;  Surgeon: Conrad Woodsburgh, MD;  Location: [facility];  Service: Cardiovascular;  Laterality: N/A;  . PHOTOCOAGULATION WITH LASER Left 11/24/2014   Procedure: PHOTOCOAGULATION WITH LASER;  Surgeon: Hurman Horn, MD;  Location: [facility];  Service: Ophthalmology;  Laterality: Left;  with insertion of silicone oil  . TUBAL LIGATION     Family History  Problem Relation Age of Onset  . COPD Mother   . Cancer Father   . Lymphoma Father   . Diabetes Sister   . Deep vein thrombosis Sister   . Diabetes Brother   . Hyperlipidemia Brother   . Hypertension Brother   . Mental retardation Sister   . Colon cancer Neg Hx   . Liver disease Neg Hx    Social History   Social History  . Marital status: Legally Separated    Spouse name:  N/A  . Number of children: N/A  . Years of education: N/A   Social History Main Topics  . Smoking status: Current Every Day Smoker    Packs/day: 1.00    Years: 21.00    Types: Cigarettes  . Smokeless tobacco: Never Used     Comment: one pack daily  . Alcohol use No  . Drug use: No  . Sexual activity: Yes    Birth control/ protection: Surgical   Other Topics Concern  . None   Social History Narrative  . None    ROS:  General: Negative for anorexia, weight loss, fever, chills, fatigue, weakness. ENT: Negative for hoarseness,see hpi. Recently started flonase for sinus issues and doing better CV: Negative for chest pain, angina, palpitations, dyspnea on exertion, peripheral edema.  Respiratory: Negative for dyspnea at rest, dyspnea on exertion, cough, sputum, wheezing.  GI: See history of present illness. GU:  Negative for dysuria, hematuria, urinary incontinence, urinary frequency, nocturnal urination.  Endo: Negative for unusual weight change.    Physical Examination:   BP (!) 148/78   Pulse 62   Temp 97.4 F  (36.3 C) (Oral)   Ht 5' 3"  (1.6 m)   Wt 176 lb 6.4 oz (80 kg)   LMP 05/05/2015   BMI 31.25 kg/m   General: Well-nourished, well-developed in no acute distress.  Eyes: No icterus. Mouth: Oropharyngeal mucosa moist and pink , no lesions erythema or exudate. Lungs: Clear to auscultation bilaterally.  Heart: Regular rate and rhythm, no murmurs rubs or gallops.  Abdomen: Bowel sounds are normal, nontender, nondistended, no hepatosplenomegaly or masses, no abdominal bruits or hernia , no rebound or guarding.   Extremities: 2+llower extremity edema.(missed dialysis on Wednesday) No clubbing or deformities. Neuro: Alert and oriented x 4   Skin: Warm and dry, no jaundice.   Psych: Alert and cooperative, normal mood and affect.  Labs:  Lab Results  Component Value Date   WBC 6.9 12/13/2015   HGB 11.2 (L) 12/13/2015   HCT 36.3 12/13/2015   MCV 102 (H) 12/13/2015   PLT 288 12/13/2015   Lab Results  Component Value Date   CREATININE 5.09 (>) 12/13/2015   BUN 54 (H) 12/13/2015   NA 135 12/13/2015   K 4.6 12/13/2015   CL 95 (L) 12/13/2015   CO2 18 12/13/2015   Lab Results  Component Value Date   ALT 60 (H) 12/13/2015   AST 43 (H) 12/13/2015   ALKPHOS 453 (H) 12/13/2015   BILITOT 0.8 12/13/2015   Lab Results  Component Value Date   IRON 44 01/21/2015   TIBC 363 01/21/2015   FERRITIN 51 01/21/2015    Imaging Studies: Dg Chest 2 View  Result Date: 08/09/2016 CLINICAL DATA:  Cough, congestion EXAM: CHEST  2 VIEW COMPARISON:  03/15/2016 FINDINGS: Cardiomediastinal silhouette is stable. No infiltrate or pleural effusion. No pulmonary edema. Bony thorax is stable. IMPRESSION: No active cardiopulmonary disease. Electronically Signed   By: Lahoma Crocker M.D.   On: 08/09/2016 12:56

## 2016-09-07 NOTE — Telephone Encounter (Signed)
Lets change to 1 tablet once daily, for 7 days  [name], MD [facility] 09/07/2016, 2:44 PM

## 2016-09-07 NOTE — Assessment & Plan Note (Signed)
Hepatomegaly on exam. Mildly elevated AST/ALT, markedly elevated alkaline phosphatase in the setting of renal disease. Recommend labs, ultrasound. Further recommendations to follow.

## 2016-09-07 NOTE — Telephone Encounter (Signed)
[facility] states that keflex 560m QID is a high dose for a dialysis patient. [facility] also [facility] states that patient has alreafy picked up rx on the 7th. Please advise

## 2016-09-07 NOTE — Patient Instructions (Signed)
1. Please have labs and ultrasound done. Once results are back, we will plan for colonoscopy and upper endoscopy.

## 2016-09-07 NOTE — Progress Notes (Signed)
cc'ed to pcp °

## 2016-09-10 ENCOUNTER — Telehealth: Payer: Self-pay

## 2016-09-10 DIAGNOSIS — Z992 Dependence on renal dialysis: Secondary | ICD-10-CM | POA: Diagnosis not present

## 2016-09-10 DIAGNOSIS — E11649 Type 2 diabetes mellitus with hypoglycemia without coma: Secondary | ICD-10-CM | POA: Diagnosis not present

## 2016-09-10 DIAGNOSIS — D509 Iron deficiency anemia, unspecified: Secondary | ICD-10-CM | POA: Diagnosis not present

## 2016-09-10 DIAGNOSIS — N186 End stage renal disease: Secondary | ICD-10-CM | POA: Diagnosis not present

## 2016-09-10 DIAGNOSIS — D631 Anemia in chronic kidney disease: Secondary | ICD-10-CM | POA: Diagnosis not present

## 2016-09-10 DIAGNOSIS — E162 Hypoglycemia, unspecified: Secondary | ICD-10-CM | POA: Diagnosis not present

## 2016-09-10 NOTE — Telephone Encounter (Signed)
US abdomen scheduled at [facility] per pt request. Appt is 09/27/16 at 9:00am at [facility]. NPO after midnight. Called and informed pt. Ordered faxed to [phone]. Letter also mailed.

## 2016-09-12 DIAGNOSIS — E11649 Type 2 diabetes mellitus with hypoglycemia without coma: Secondary | ICD-10-CM | POA: Diagnosis not present

## 2016-09-12 DIAGNOSIS — E162 Hypoglycemia, unspecified: Secondary | ICD-10-CM | POA: Diagnosis not present

## 2016-09-12 DIAGNOSIS — Z992 Dependence on renal dialysis: Secondary | ICD-10-CM | POA: Diagnosis not present

## 2016-09-12 DIAGNOSIS — D631 Anemia in chronic kidney disease: Secondary | ICD-10-CM | POA: Diagnosis not present

## 2016-09-12 DIAGNOSIS — N186 End stage renal disease: Secondary | ICD-10-CM | POA: Diagnosis not present

## 2016-09-12 DIAGNOSIS — D509 Iron deficiency anemia, unspecified: Secondary | ICD-10-CM | POA: Diagnosis not present

## 2016-09-13 ENCOUNTER — Telehealth: Payer: Self-pay | Admitting: Family Medicine

## 2016-09-13 ENCOUNTER — Other Ambulatory Visit: Payer: Self-pay | Admitting: Vascular Surgery

## 2016-09-13 DIAGNOSIS — T8249XD Other complication of vascular dialysis catheter, subsequent encounter: Secondary | ICD-10-CM

## 2016-09-13 NOTE — Telephone Encounter (Signed)
Pt aware insurance denied

## 2016-09-14 DIAGNOSIS — E162 Hypoglycemia, unspecified: Secondary | ICD-10-CM | POA: Diagnosis not present

## 2016-09-14 DIAGNOSIS — D509 Iron deficiency anemia, unspecified: Secondary | ICD-10-CM | POA: Diagnosis not present

## 2016-09-14 DIAGNOSIS — D631 Anemia in chronic kidney disease: Secondary | ICD-10-CM | POA: Diagnosis not present

## 2016-09-14 DIAGNOSIS — Z992 Dependence on renal dialysis: Secondary | ICD-10-CM | POA: Diagnosis not present

## 2016-09-14 DIAGNOSIS — N186 End stage renal disease: Secondary | ICD-10-CM | POA: Diagnosis not present

## 2016-09-14 DIAGNOSIS — E11649 Type 2 diabetes mellitus with hypoglycemia without coma: Secondary | ICD-10-CM | POA: Diagnosis not present

## 2016-09-17 DIAGNOSIS — D631 Anemia in chronic kidney disease: Secondary | ICD-10-CM | POA: Diagnosis not present

## 2016-09-17 DIAGNOSIS — E162 Hypoglycemia, unspecified: Secondary | ICD-10-CM | POA: Diagnosis not present

## 2016-09-17 DIAGNOSIS — N186 End stage renal disease: Secondary | ICD-10-CM | POA: Diagnosis not present

## 2016-09-17 DIAGNOSIS — E11649 Type 2 diabetes mellitus with hypoglycemia without coma: Secondary | ICD-10-CM | POA: Diagnosis not present

## 2016-09-17 DIAGNOSIS — Z992 Dependence on renal dialysis: Secondary | ICD-10-CM | POA: Diagnosis not present

## 2016-09-17 DIAGNOSIS — D509 Iron deficiency anemia, unspecified: Secondary | ICD-10-CM | POA: Diagnosis not present

## 2016-09-18 ENCOUNTER — Telehealth: Payer: Self-pay | Admitting: Family Medicine

## 2016-09-18 DIAGNOSIS — K529 Noninfective gastroenteritis and colitis, unspecified: Secondary | ICD-10-CM | POA: Diagnosis not present

## 2016-09-18 DIAGNOSIS — R160 Hepatomegaly, not elsewhere classified: Secondary | ICD-10-CM | POA: Diagnosis not present

## 2016-09-18 DIAGNOSIS — D638 Anemia in other chronic diseases classified elsewhere: Secondary | ICD-10-CM | POA: Diagnosis not present

## 2016-09-18 DIAGNOSIS — K625 Hemorrhage of anus and rectum: Secondary | ICD-10-CM | POA: Diagnosis not present

## 2016-09-18 DIAGNOSIS — R195 Other fecal abnormalities: Secondary | ICD-10-CM | POA: Diagnosis not present

## 2016-09-18 DIAGNOSIS — R7989 Other specified abnormal findings of blood chemistry: Secondary | ICD-10-CM | POA: Diagnosis not present

## 2016-09-18 DIAGNOSIS — R1310 Dysphagia, unspecified: Secondary | ICD-10-CM | POA: Diagnosis not present

## 2016-09-18 MED ORDER — CITALOPRAM HYDROBROMIDE 20 MG PO TABS: 20.0000 mg | ORAL_TABLET | Freq: Every day | ORAL | 1 refills | Status: DC

## 2016-09-18 NOTE — Telephone Encounter (Signed)
Celexa 20 mg, #30 with one refill sent to [facility].  Patient aware

## 2016-09-18 NOTE — Telephone Encounter (Signed)
What is the name of the medication? citalopram (CELEXA) 20 MG tablet  Have you contacted your pharmacy to request a refill? Yes, [facility], but pharmacy told pt to call us for a new rx  Which pharmacy would you like this sent to? Pt asked to Use [facility] because pt is out for a week.   Patient notified that their request is being sent to the clinical staff for review and that they should receive a call once it is complete. If they do not receive a call within 24 hours they can check with their pharmacy or our office.

## 2016-09-19 DIAGNOSIS — N186 End stage renal disease: Secondary | ICD-10-CM | POA: Diagnosis not present

## 2016-09-19 DIAGNOSIS — D509 Iron deficiency anemia, unspecified: Secondary | ICD-10-CM | POA: Diagnosis not present

## 2016-09-19 DIAGNOSIS — Z992 Dependence on renal dialysis: Secondary | ICD-10-CM | POA: Diagnosis not present

## 2016-09-19 DIAGNOSIS — E162 Hypoglycemia, unspecified: Secondary | ICD-10-CM | POA: Diagnosis not present

## 2016-09-19 DIAGNOSIS — D631 Anemia in chronic kidney disease: Secondary | ICD-10-CM | POA: Diagnosis not present

## 2016-09-19 DIAGNOSIS — E11649 Type 2 diabetes mellitus with hypoglycemia without coma: Secondary | ICD-10-CM | POA: Diagnosis not present

## 2016-09-21 DIAGNOSIS — Z992 Dependence on renal dialysis: Secondary | ICD-10-CM | POA: Diagnosis not present

## 2016-09-21 DIAGNOSIS — D509 Iron deficiency anemia, unspecified: Secondary | ICD-10-CM | POA: Diagnosis not present

## 2016-09-21 DIAGNOSIS — E11649 Type 2 diabetes mellitus with hypoglycemia without coma: Secondary | ICD-10-CM | POA: Diagnosis not present

## 2016-09-21 DIAGNOSIS — D631 Anemia in chronic kidney disease: Secondary | ICD-10-CM | POA: Diagnosis not present

## 2016-09-21 DIAGNOSIS — E162 Hypoglycemia, unspecified: Secondary | ICD-10-CM | POA: Diagnosis not present

## 2016-09-21 DIAGNOSIS — N186 End stage renal disease: Secondary | ICD-10-CM | POA: Diagnosis not present

## 2016-09-21 NOTE — Progress Notes (Signed)
Reviewed labs ordered at time of office visit. They were done at [facility] hand. ANA negative, smooth muscle antibody negative, TT GI GA less than 2, IgA level normal at 233, ferritin elevated at 301, hepatitis A total antibody negative, hepatitis C antibody less than 0.1, they performed a hepatitis B E antigen which was negative instead of the hepatitis B surface antigen, IgG normal at 1283, IgM normal at 126, total bilirubin 1.4, direct bilirubin 0.8 alkaline phosphatase elevated at 3:15, AST 18.4, ALT 14, albumin 3.9, iron 49 (normal 50-170), TIBC 322, iron saturations 15% low normal, white blood cell count 5800, hemoglobin 11.1, hematocrit 33.4, MCV 101.5, platelets 249,000.   Recommend proceeding with TCS/EGD/ED with [facility] in OR. Dx: heme+stool, rectal bleeding, anemia, diarrhea, solid food dysphagia. Day before 1/2 dose humalog, day of procedure she can take her hydralazine, coreg that morning but hold other meds until after procedure.    Please add on vitamin B12, folate level. Please also asked the lab to perform the tests that I ordered which was hepatitis B surface antigen, mitochondrial antibody which were both missed on the orders. Please credit patient's account for the hepatitis B E antigen which I did not order.

## 2016-09-21 NOTE — Progress Notes (Signed)
Tried to call pt. Gentleman answered the phone and said that she was at Dialysis. Left message with him to have her call our office.

## 2016-09-24 ENCOUNTER — Other Ambulatory Visit: Payer: Self-pay

## 2016-09-24 DIAGNOSIS — K625 Hemorrhage of anus and rectum: Secondary | ICD-10-CM

## 2016-09-24 DIAGNOSIS — D509 Iron deficiency anemia, unspecified: Secondary | ICD-10-CM | POA: Diagnosis not present

## 2016-09-24 DIAGNOSIS — R1310 Dysphagia, unspecified: Secondary | ICD-10-CM

## 2016-09-24 DIAGNOSIS — R197 Diarrhea, unspecified: Secondary | ICD-10-CM

## 2016-09-24 DIAGNOSIS — D631 Anemia in chronic kidney disease: Secondary | ICD-10-CM | POA: Diagnosis not present

## 2016-09-24 DIAGNOSIS — D649 Anemia, unspecified: Secondary | ICD-10-CM

## 2016-09-24 DIAGNOSIS — E162 Hypoglycemia, unspecified: Secondary | ICD-10-CM | POA: Diagnosis not present

## 2016-09-24 DIAGNOSIS — N186 End stage renal disease: Secondary | ICD-10-CM | POA: Diagnosis not present

## 2016-09-24 DIAGNOSIS — Z992 Dependence on renal dialysis: Secondary | ICD-10-CM | POA: Diagnosis not present

## 2016-09-24 DIAGNOSIS — E11649 Type 2 diabetes mellitus with hypoglycemia without coma: Secondary | ICD-10-CM | POA: Diagnosis not present

## 2016-09-24 MED ORDER — PEG 3350-KCL-NA BICARB-NACL 420 G PO SOLR: 4000.0000 mL | ORAL | 0 refills | Status: DC

## 2016-09-24 NOTE — Progress Notes (Signed)
Spoke with patient and she is set up for TCS/EGD/ED with [facility] on 10/02/16 @ 1:45. She is aware and I will type up the paper work and send it to her in the mail.

## 2016-09-25 ENCOUNTER — Telehealth: Payer: Self-pay

## 2016-09-25 DIAGNOSIS — E103511 Type 1 diabetes mellitus with proliferative diabetic retinopathy with macular edema, right eye: Secondary | ICD-10-CM | POA: Diagnosis not present

## 2016-09-25 DIAGNOSIS — E113511 Type 2 diabetes mellitus with proliferative diabetic retinopathy with macular edema, right eye: Secondary | ICD-10-CM | POA: Diagnosis not present

## 2016-09-25 NOTE — Telephone Encounter (Signed)
Pt said she had her labs done at [facility] that Neil Crouch, PA had ordered for her.  I called to speak to someone about the labs that were not done and also one that was resulted and NOT ordered.  I got transferred to [facility] [phone]).   Per Magda Paganini, she ordered Hepatitis B surface antigen and mitochondrial antibody which were not done.   Pt had a result on Hepatitis B E antigen which WAS NOT ORDERED and should be credited to pt's acct.  Also, Magda Paganini would like to add on Vitamin B12 and folate level.   I left message for a return call from [place] to discuss.

## 2016-09-25 NOTE — Telephone Encounter (Signed)
Cyril Mourning returned my call. She said the hepatitis B E antigen is their Hepatitis B surface antigen.  She also said the mitochondrial antibody is included in the smooth muscle antibody.   Magda Paganini, please advise!

## 2016-09-26 ENCOUNTER — Other Ambulatory Visit (HOSPITAL_COMMUNITY): Payer: Medicare Other

## 2016-09-26 DIAGNOSIS — D631 Anemia in chronic kidney disease: Secondary | ICD-10-CM | POA: Diagnosis not present

## 2016-09-26 DIAGNOSIS — N186 End stage renal disease: Secondary | ICD-10-CM | POA: Diagnosis not present

## 2016-09-26 DIAGNOSIS — E11649 Type 2 diabetes mellitus with hypoglycemia without coma: Secondary | ICD-10-CM | POA: Diagnosis not present

## 2016-09-26 DIAGNOSIS — D509 Iron deficiency anemia, unspecified: Secondary | ICD-10-CM | POA: Diagnosis not present

## 2016-09-26 DIAGNOSIS — E162 Hypoglycemia, unspecified: Secondary | ICD-10-CM | POA: Diagnosis not present

## 2016-09-26 DIAGNOSIS — Z992 Dependence on renal dialysis: Secondary | ICD-10-CM | POA: Diagnosis not present

## 2016-09-26 NOTE — Patient Instructions (Signed)
Kara Knapp  09/26/2016     @PREFPERIOPPHARMACY @   Your procedure is scheduled on  10/02/2016   Report to [facility] at  1215  P.M.  Call this number if you have problems the morning of surgery:  [phone]   Remember:  Do not eat food or drink liquids after midnight.  Take these medicines the morning of surgery with A SIP OF WATER  Coreg, celexa, neurontin. Take 1/2 of your usual night time insulin the night before your surgery. DO NOT take any medications for your diabetes the morning of surgery.   Do not wear jewelry, make-up or nail polish.  Do not wear lotions, powders, or perfumes, or deoderant.  Do not shave 48 hours prior to surgery.  Men may shave face and neck.  Do not bring valuables to the hospital.  [facility] is not responsible for any belongings or valuables.  Contacts, dentures or bridgework may not be worn into surgery.  Leave your suitcase in the car.  After surgery it may be brought to your room.  For patients admitted to the hospital, discharge time will be determined by your treatment team.  Patients discharged the day of surgery will not be allowed to drive home.   Name and phone number of your driver:   family Special instructions:  Follow the diet and prep instructions given to you by Dr Nona Dell office.  Please read over the following fact sheets that you were given. Anesthesia Post-op Instructions and Care and Recovery After Surgery       Esophagogastroduodenoscopy Esophagogastroduodenoscopy (EGD) is a procedure to examine the lining of the esophagus, stomach, and first part of the small intestine (duodenum). This procedure is done to check for problems such as inflammation, bleeding, ulcers, or growths. During this procedure, a long, flexible, lighted tube with a camera attached (endoscope) is inserted down the throat. Tell a health care provider about:  Any allergies you have.  All medicines you are taking,  including vitamins, herbs, eye drops, creams, and over-the-counter medicines.  Any problems you or family members have had with anesthetic medicines.  Any blood disorders you have.  Any surgeries you have had.  Any medical conditions you have.  Whether you are pregnant or may be pregnant. What are the risks? Generally, this is a safe procedure. However, problems may occur, including:  Infection.  Bleeding.  A tear (perforation) in the esophagus, stomach, or duodenum.  Trouble breathing.  Excessive sweating.  Spasms of the larynx.  A slowed heartbeat.  Low blood pressure. What happens before the procedure?  Follow instructions from your health care provider about eating or drinking restrictions.  Ask your health care provider about:  Changing or stopping your regular medicines. This is especially important if you are taking diabetes medicines or blood thinners.  Taking medicines such as aspirin and ibuprofen. These medicines can thin your blood. Do not take these medicines before your procedure if your health care provider instructs you not to.  Plan to have someone take you home after the procedure.  If you wear dentures, be ready to remove them before the procedure. What happens during the procedure?  To reduce your risk of infection, your health care team will wash or sanitize their hands.  An IV tube will be put in a vein in your hand or arm. You will get medicines and fluids through this tube.  You will  be given one or more of the following:  A medicine to help you relax (sedative).  A medicine to numb the area (local anesthetic). This medicine may be sprayed into your throat. It will make you feel more comfortable and keep you from gagging or coughing during the procedure.  A medicine for pain.  A mouth guard may be placed in your mouth to protect your teeth and to keep you from biting on the endoscope.  You will be asked to lie on your left side.  The  endoscope will be lowered down your throat into your esophagus, stomach, and duodenum.  Air will be put into the endoscope. This will help your health care provider see better.  The lining of your esophagus, stomach, and duodenum will be examined.  Your health care provider may:  Take a tissue sample so it can be looked at in a lab (biopsy).  Remove growths.  Remove objects (foreign bodies) that are stuck.  Treat any bleeding with medicines or other devices that stop tissue from bleeding.  Widen (dilate) or stretch narrowed areas of your esophagus and stomach.  The endoscope will be taken out. The procedure may vary among health care providers and hospitals. What happens after the procedure?  Your blood pressure, heart rate, breathing rate, and blood oxygen level will be monitored often until the medicines you were given have worn off.  Do not eat or drink anything until the numbing medicine has worn off and your gag reflex has returned. This information is not intended to replace advice given to you by your health care provider. Make sure you discuss any questions you have with your health care provider. Document Released: 11/16/2004 Document Revised: 12/22/2015 Document Reviewed: 06/09/2015 Elsevier Interactive Patient Education  2017 [place]. Esophagogastroduodenoscopy, Care After Refer to this sheet in the next few weeks. These instructions provide you with information about caring for yourself after your procedure. Your health care provider may also give you more specific instructions. Your treatment has been planned according to current medical practices, but problems sometimes occur. Call your health care provider if you have any problems or questions after your procedure. What can I expect after the procedure? After the procedure, it is common to have:  A sore throat.  Nausea.  Bloating.  Dizziness.  Fatigue. Follow these instructions at home:  Do not eat or  drink anything until the numbing medicine (local anesthetic) has worn off and your gag reflex has returned. You will know that the local anesthetic has worn off when you can swallow comfortably.  Do not drive for 24 hours if you received a medicine to help you relax (sedative).  If your health care provider took a tissue sample for testing during the procedure, make sure to get your test results. This is your responsibility. Ask your health care provider or the department performing the test when your results will be ready.  Keep all follow-up visits as told by your health care provider. This is important. Contact a health care provider if:  You cannot stop coughing.  You are not urinating.  You are urinating less than usual. Get help right away if:  You have trouble swallowing.  You cannot eat or drink.  You have throat or chest pain that gets worse.  You are dizzy or light-headed.  You faint.  You have nausea or vomiting.  You have chills.  You have a fever.  You have severe abdominal pain.  You have black, tarry, or  bloody stools. This information is not intended to replace advice given to you by your health care provider. Make sure you discuss any questions you have with your health care provider. Document Released: 07/02/2012 Document Revised: 12/22/2015 Document Reviewed: 06/09/2015 Elsevier Interactive Patient Education  2017 [employer]  Esophageal Dilatation Esophageal dilatation is a procedure to open a blocked or narrowed part of the esophagus. The esophagus is the long tube in your throat that carries food and liquid from your mouth to your stomach. The procedure is also called esophageal dilation. You may need this procedure if you have a buildup of scar tissue in your esophagus that makes it difficult, painful, or even impossible to swallow. This can be caused by gastroesophageal reflux disease (GERD). In rare cases, people need this procedure because they  have cancer of the esophagus or a problem with the way food moves through the esophagus. Sometimes you may need to have another dilatation to enlarge the opening of the esophagus gradually. Tell a health care provider about:  Any allergies you have.  All medicines you are taking, including vitamins, herbs, eye drops, creams, and over-the-counter medicines.  Any problems you or family members have had with anesthetic medicines.  Any blood disorders you have.  Any surgeries you have had.  Any medical conditions you have.  Any antibiotic medicines you are required to take before dental procedures. What are the risks? Generally, this is a safe procedure. However, problems can occur and include:  Bleeding from a tear in the lining of the esophagus.  A hole (perforation) in the esophagus. What happens before the procedure?  Do not eat or drink anything after midnight on the night before the procedure or as directed by your health care provider.  Ask your health care provider about changing or stopping your regular medicines. This is especially important if you are taking diabetes medicines or blood thinners.  Plan to have someone take you home after the procedure. What happens during the procedure?  You will be given a medicine that makes you relaxed and sleepy (sedative).  A medicine may be sprayed or gargled to numb the back of the throat.  Your health care provider can use various instruments to do an esophageal dilatation. During the procedure, the instrument used will be placed in your mouth and passed down into your esophagus. Options include:  Simple dilators. This instrument is carefully placed in the esophagus to stretch it.  Guided wire bougies. In this method, a flexible tube (endoscope) is used to insert a wire into the esophagus. The dilator is passed over this wire to enlarge the esophagus. Then the wire is removed.  Balloon dilators. An endoscope with a small balloon  at the end is passed down into the esophagus. Inflating the balloon gently stretches the esophagus and opens it up. What happens after the procedure?  Your blood pressure, heart rate, breathing rate, and blood oxygen level will be monitored often until the medicines you were given have worn off.  Your throat may feel slightly sore and will probably still feel numb. This will improve slowly over time.  You will not be allowed to eat or drink until the throat numbness has resolved.  If this is a same-day procedure, you may be allowed to go home once you have been able to drink, urinate, and sit on the edge of the bed without nausea or dizziness.  If this is a same-day procedure, you should have a friend or family member with  you for the next 24 hours after the procedure. This information is not intended to replace advice given to you by your health care provider. Make sure you discuss any questions you have with your health care provider. Document Released: 09/06/2005 Document Revised: 12/22/2015 Document Reviewed: 11/25/2013 Elsevier Interactive Patient Education  2017 [place].  Colonoscopy, Adult A colonoscopy is an exam to look at the entire large intestine. During the exam, a lubricated, bendable tube is inserted into the anus and then passed into the rectum, colon, and other parts of the large intestine. A colonoscopy is often done as a part of normal colorectal screening or in response to certain symptoms, such as anemia, persistent diarrhea, abdominal pain, and blood in the stool. The exam can help screen for and diagnose medical problems, including:  Tumors.  Polyps.  Inflammation.  Areas of bleeding. Tell a health care provider about:  Any allergies you have.  All medicines you are taking, including vitamins, herbs, eye drops, creams, and over-the-counter medicines.  Any problems you or family members have had with anesthetic medicines.  Any blood disorders you  have.  Any surgeries you have had.  Any medical conditions you have.  Any problems you have had passing stool. What are the risks? Generally, this is a safe procedure. However, problems may occur, including:  Bleeding.  A tear in the intestine.  A reaction to medicines given during the exam.  Infection (rare). What happens before the procedure? Eating and drinking restrictions  Follow instructions from your health care provider about eating and drinking, which may include:  A few days before the procedure - follow a low-fiber diet. Avoid nuts, seeds, dried fruit, raw fruits, and vegetables.  1-3 days before the procedure - follow a clear liquid diet. Drink only clear liquids, such as clear broth or bouillon, black coffee or tea, clear juice, clear soft drinks or sports drinks, gelatin dessert, and popsicles. Avoid any liquids that contain red or purple dye.  On the day of the procedure - do not eat or drink anything during the 2 hours before the procedure, or within the time period that your health care provider recommends. Bowel prep  If you were prescribed an oral bowel prep to clean out your colon:  Take it as told by your health care provider. Starting the day before your procedure, you will need to drink a large amount of medicated liquid. The liquid will cause you to have multiple loose stools until your stool is almost clear or light green.  If your skin or anus gets irritated from diarrhea, you may use these to relieve the irritation:  Medicated wipes, such as adult wet wipes with aloe and vitamin E.  A skin soothing-product like petroleum jelly.  If you vomit while drinking the bowel prep, take a break for up to 60 minutes and then begin the bowel prep again. If vomiting continues and you cannot take the bowel prep without vomiting, call your health care provider. General instructions   Ask your health care provider about changing or stopping your regular medicines.  This is especially important if you are taking diabetes medicines or blood thinners.  Plan to have someone take you home from the hospital or clinic. What happens during the procedure?  An IV tube may be inserted into one of your veins.  You will be given medicine to help you relax (sedative).  To reduce your risk of infection:  Your health care team will wash or sanitize their  hands.  Your anal area will be washed with soap.  You will be asked to lie on your side with your knees bent.  Your health care provider will lubricate a long, thin, flexible tube. The tube will have a camera and a light on the end.  The tube will be inserted into your anus.  The tube will be gently eased through your rectum and colon.  Air will be delivered into your colon to keep it open. You may feel some pressure or cramping.  The camera will be used to take images during the procedure.  A small tissue sample may be removed from your body to be examined under a microscope (biopsy). If any potential problems are found, the tissue will be sent to a lab for testing.  If small polyps are found, your health care provider may remove them and have them checked for cancer cells.  The tube that was inserted into your anus will be slowly removed. The procedure may vary among health care providers and hospitals. What happens after the procedure?  Your blood pressure, heart rate, breathing rate, and blood oxygen level will be monitored until the medicines you were given have worn off.  Do not drive for 24 hours after the exam.  You may have a small amount of blood in your stool.  You may pass gas and have mild abdominal cramping or bloating due to the air that was used to inflate your colon during the exam.  It is up to you to get the results of your procedure. Ask your health care provider, or the department performing the procedure, when your results will be ready. This information is not intended to  replace advice given to you by your health care provider. Make sure you discuss any questions you have with your health care provider. Document Released: 07/13/2000 Document Revised: 05/16/2016 Document Reviewed: 09/27/2015 Elsevier Interactive Patient Education  2017 [employer]  Colonoscopy, Adult, Care After This sheet gives you information about how to care for yourself after your procedure. Your health care provider may also give you more specific instructions. If you have problems or questions, contact your health care provider. What can I expect after the procedure? After the procedure, it is common to have:  A small amount of blood in your stool for 24 hours after the procedure.  Some gas.  Mild abdominal cramping or bloating. Follow these instructions at home: General instructions    For the first 24 hours after the procedure:  Do not drive or use machinery.  Do not sign important documents.  Do not drink alcohol.  Do your regular daily activities at a slower pace than normal.  Eat soft, easy-to-digest foods.  Rest often.  Take over-the-counter or prescription medicines only as told by your health care provider.  It is up to you to get the results of your procedure. Ask your health care provider, or the department performing the procedure, when your results will be ready. Relieving cramping and bloating   Try walking around when you have cramps or feel bloated.  Apply heat to your abdomen as told by your health care provider. Use a heat source that your health care provider recommends, such as a moist heat pack or a heating pad.  Place a towel between your skin and the heat source.  Leave the heat on for 20-30 minutes.  Remove the heat if your skin turns bright red. This is especially important if you are unable to feel pain,  heat, or cold. You may have a greater risk of getting burned. Eating and drinking   Drink enough fluid to keep your urine clear or  pale yellow.  Resume your normal diet as instructed by your health care provider. Avoid heavy or fried foods that are hard to digest.  Avoid drinking alcohol for as long as instructed by your health care provider. Contact a health care provider if:  You have blood in your stool 2-3 days after the procedure. Get help right away if:  You have more than a small spotting of blood in your stool.  You pass large blood clots in your stool.  Your abdomen is swollen.  You have nausea or vomiting.  You have a fever.  You have increasing abdominal pain that is not relieved with medicine. This information is not intended to replace advice given to you by your health care provider. Make sure you discuss any questions you have with your health care provider. Document Released: 02/28/2004 Document Revised: 04/09/2016 Document Reviewed: 09/27/2015 Elsevier Interactive Patient Education  2017 [place] Anesthesia is a term that refers to techniques, procedures, and medicines that help a person stay safe and comfortable during a medical procedure. Monitored anesthesia care, or sedation, is one type of anesthesia. Your anesthesia specialist may recommend sedation if you will be having a procedure that does not require you to be unconscious, such as:  Cataract surgery.  A dental procedure.  A biopsy.  A colonoscopy. During the procedure, you may receive a medicine to help you relax (sedative). There are three levels of sedation:  Mild sedation. At this level, you may feel awake and relaxed. You will be able to follow directions.  Moderate sedation. At this level, you will be sleepy. You may not remember the procedure.  Deep sedation. At this level, you will be asleep. You will not remember the procedure. The more medicine you are given, the deeper your level of sedation will be. Depending on how you respond to the procedure, the anesthesia specialist may change  your level of sedation or the type of anesthesia to fit your needs. An anesthesia specialist will monitor you closely during the procedure. Let your health care provider know about:  Any allergies you have.  All medicines you are taking, including vitamins, herbs, eye drops, creams, and over-the-counter medicines.  Any use of steroids (by mouth or as a cream).  Any problems you or family members have had with sedatives and anesthetic medicines.  Any blood disorders you have.  Any surgeries you have had.  Any medical conditions you have, such as sleep apnea.  Whether you are pregnant or may be pregnant.  Any use of cigarettes, alcohol, or street drugs. What are the risks? Generally, this is a safe procedure. However, problems may occur, including:  Getting too much medicine (oversedation).  Nausea.  Allergic reaction to medicines.  Trouble breathing. If this happens, a breathing tube may be used to help with breathing. It will be removed when you are awake and breathing on your own.  Heart trouble.  Lung trouble. Before the procedure Staying hydrated  Follow instructions from your health care provider about hydration, which may include:  Up to 2 hours before the procedure - you may continue to drink clear liquids, such as water, clear fruit juice, black coffee, and plain tea. Eating and drinking restrictions  Follow instructions from your health care provider about eating and drinking, which may include:  8 hours  before the procedure - stop eating heavy meals or foods such as meat, fried foods, or fatty foods.  6 hours before the procedure - stop eating light meals or foods, such as toast or cereal.  6 hours before the procedure - stop drinking milk or drinks that contain milk.  2 hours before the procedure - stop drinking clear liquids. Medicines  Ask your health care provider about:  Changing or stopping your regular medicines. This is especially important if you  are taking diabetes medicines or blood thinners.  Taking medicines such as aspirin and ibuprofen. These medicines can thin your blood. Do not take these medicines before your procedure if your health care provider instructs you not to. Tests and exams  You will have a physical exam.  You may have blood tests done to show:  How well your kidneys and liver are working.  How well your blood can clot.  General instructions  Plan to have someone take you home from the hospital or clinic.  If you will be going home right after the procedure, plan to have someone with you for 24 hours. What happens during the procedure?  Your blood pressure, heart rate, breathing, level of pain and overall condition will be monitored.  An IV tube will be inserted into one of your veins.  Your anesthesia specialist will give you medicines as needed to keep you comfortable during the procedure. This may mean changing the level of sedation.  The procedure will be performed. After the procedure  Your blood pressure, heart rate, breathing rate, and blood oxygen level will be monitored until the medicines you were given have worn off.  Do not drive for 24 hours if you received a sedative.  You may:  Feel sleepy, clumsy, or nauseous.  Feel forgetful about what happened after the procedure.  Have a sore throat if you had a breathing tube during the procedure.  Vomit. This information is not intended to replace advice given to you by your health care provider. Make sure you discuss any questions you have with your health care provider. Document Released: 04/11/2005 Document Revised: 12/23/2015 Document Reviewed: 11/06/2015 Elsevier Interactive Patient Education  2017 [place], Care After These instructions provide you with information about caring for yourself after your procedure. Your health care provider may also give you more specific instructions. Your treatment has  been planned according to current medical practices, but problems sometimes occur. Call your health care provider if you have any problems or questions after your procedure. What can I expect after the procedure? After your procedure, it is common to:  Feel sleepy for several hours.  Feel clumsy and have poor balance for several hours.  Feel forgetful about what happened after the procedure.  Have poor judgment for several hours.  Feel nauseous or vomit.  Have a sore throat if you had a breathing tube during the procedure. Follow these instructions at home: For at least 24 hours after the procedure:    Do not:  Participate in activities in which you could fall or become injured.  Drive.  Use heavy machinery.  Drink alcohol.  Take sleeping pills or medicines that cause drowsiness.  Make important decisions or sign legal documents.  Take care of children on your own.  Rest. Eating and drinking   Follow the diet that is recommended by your health care provider.  If you vomit, drink water, juice, or soup when you can drink without vomiting.  Make sure  you have little or no nausea before eating solid foods. General instructions   Have a responsible adult stay with you until you are awake and alert.  Take over-the-counter and prescription medicines only as told by your health care provider.  If you smoke, do not smoke without supervision.  Keep all follow-up visits as told by your health care provider. This is important. Contact a health care provider if:  You keep feeling nauseous or you keep vomiting.  You feel light-headed.  You develop a rash.  You have a fever. Get help right away if:  You have trouble breathing. This information is not intended to replace advice given to you by your health care provider. Make sure you discuss any questions you have with your health care provider. Document Released: 11/06/2015 Document Revised: 03/07/2016 Document  Reviewed: 11/06/2015 Elsevier Interactive Patient Education  2017 [employer].

## 2016-09-27 DIAGNOSIS — R160 Hepatomegaly, not elsewhere classified: Secondary | ICD-10-CM | POA: Diagnosis not present

## 2016-09-27 DIAGNOSIS — R748 Abnormal levels of other serum enzymes: Secondary | ICD-10-CM | POA: Diagnosis not present

## 2016-09-27 DIAGNOSIS — N186 End stage renal disease: Secondary | ICD-10-CM | POA: Diagnosis not present

## 2016-09-27 DIAGNOSIS — E1122 Type 2 diabetes mellitus with diabetic chronic kidney disease: Secondary | ICD-10-CM | POA: Diagnosis not present

## 2016-09-27 DIAGNOSIS — Z992 Dependence on renal dialysis: Secondary | ICD-10-CM | POA: Diagnosis not present

## 2016-09-27 DIAGNOSIS — R188 Other ascites: Secondary | ICD-10-CM | POA: Diagnosis not present

## 2016-09-27 DIAGNOSIS — Z9049 Acquired absence of other specified parts of digestive tract: Secondary | ICD-10-CM | POA: Diagnosis not present

## 2016-09-28 ENCOUNTER — Encounter (HOSPITAL_COMMUNITY)
Admission: RE | Admit: 2016-09-28 | Discharge: 2016-09-28 | Disposition: A | Discharge status: Home / Self Care | Payer: Medicare Other | Source: Ambulatory Visit | Attending: Gastroenterology | Admitting: Gastroenterology

## 2016-09-28 ENCOUNTER — Other Ambulatory Visit: Payer: Self-pay

## 2016-09-28 ENCOUNTER — Encounter (HOSPITAL_COMMUNITY): Payer: Self-pay

## 2016-09-28 ENCOUNTER — Other Ambulatory Visit: Payer: Self-pay | Admitting: Gastroenterology

## 2016-09-28 ENCOUNTER — Encounter: Payer: Self-pay | Admitting: Vascular Surgery

## 2016-09-28 DIAGNOSIS — D539 Nutritional anemia, unspecified: Secondary | ICD-10-CM

## 2016-09-28 DIAGNOSIS — D631 Anemia in chronic kidney disease: Secondary | ICD-10-CM | POA: Diagnosis not present

## 2016-09-28 DIAGNOSIS — N186 End stage renal disease: Secondary | ICD-10-CM | POA: Diagnosis not present

## 2016-09-28 DIAGNOSIS — D7589 Other specified diseases of blood and blood-forming organs: Secondary | ICD-10-CM

## 2016-09-28 DIAGNOSIS — Z01818 Encounter for other preprocedural examination: Secondary | ICD-10-CM | POA: Insufficient documentation

## 2016-09-28 DIAGNOSIS — R945 Abnormal results of liver function studies: Secondary | ICD-10-CM

## 2016-09-28 DIAGNOSIS — R7989 Other specified abnormal findings of blood chemistry: Secondary | ICD-10-CM

## 2016-09-28 DIAGNOSIS — K635 Polyp of colon: Secondary | ICD-10-CM | POA: Diagnosis not present

## 2016-09-28 DIAGNOSIS — D509 Iron deficiency anemia, unspecified: Secondary | ICD-10-CM | POA: Diagnosis not present

## 2016-09-28 DIAGNOSIS — N250 Renal osteodystrophy: Secondary | ICD-10-CM | POA: Diagnosis not present

## 2016-09-28 DIAGNOSIS — Z992 Dependence on renal dialysis: Secondary | ICD-10-CM | POA: Diagnosis not present

## 2016-09-28 DIAGNOSIS — E11649 Type 2 diabetes mellitus with hypoglycemia without coma: Secondary | ICD-10-CM | POA: Diagnosis not present

## 2016-09-28 DIAGNOSIS — Z01810 Encounter for preprocedural cardiovascular examination: Secondary | ICD-10-CM | POA: Diagnosis not present

## 2016-09-28 DIAGNOSIS — E162 Hypoglycemia, unspecified: Secondary | ICD-10-CM | POA: Diagnosis not present

## 2016-09-28 DIAGNOSIS — Z794 Long term (current) use of insulin: Secondary | ICD-10-CM | POA: Diagnosis not present

## 2016-09-28 LAB — VITAMIN B12: VITAMIN B 12: 377 pg/mL (ref 180–914)

## 2016-09-28 LAB — FOLATE: FOLATE: 8.2 ng/mL (ref >5.9)

## 2016-09-28 NOTE — Telephone Encounter (Signed)
Kara Knapp, patient is scheduled for EGD/TCS next week. Can we have the labs that need to be done in endo while she is there since she has transportation issues?  We need the following and I have put in order: Vitamin B12 Folate Hep B surface antigen Mitochondrial antibody

## 2016-09-28 NOTE — Progress Notes (Signed)
Reviewed abd u/s done at [facility]: Moderate amount of ascites, increased hepatic echotexture without discrete mass. Possible portal venous collaterals. This these findings may reflect cirrhosis. Spleen was normal in size. 8 mm hyperechoic focus in the upper pole the left kidney may represent angiomyolipoma but is nonspecific.  Recommend repeat abd u/s in six months for ?cirrhosis/hepatoma screening, left kidney lesion.   Procedures as planned.

## 2016-09-28 NOTE — Telephone Encounter (Signed)
Pt is having pre-op now. I spoke to Meire [place] and she can draw the labs now.  Faxed orders to her @ [phone].

## 2016-09-29 LAB — HEPATITIS B SURFACE ANTIGEN: HEP B S AG: NEGATIVE

## 2016-09-30 LAB — MITOCHONDRIAL ANTIBODIES: Mitochondrial M2 Ab, IgG: 8 Units (ref 0.0–20.0)

## 2016-10-01 ENCOUNTER — Telehealth: Payer: Self-pay

## 2016-10-01 NOTE — Telephone Encounter (Signed)
Talked with patient and she is aware that we have moved her time up and she needs to be there in the morning at 11:00 am.

## 2016-10-02 ENCOUNTER — Ambulatory Visit (HOSPITAL_COMMUNITY)
Admission: AD | Admit: 2016-10-02 | Discharge: 2016-10-02 | Disposition: A | Discharge status: Home / Self Care | Payer: Medicare Other | Source: Ambulatory Visit | Attending: Gastroenterology | Admitting: Gastroenterology

## 2016-10-02 ENCOUNTER — Encounter (HOSPITAL_COMMUNITY)
Admission: AD | Disposition: A | Discharge status: Home / Self Care | Payer: Self-pay | Source: Ambulatory Visit | Attending: Gastroenterology

## 2016-10-02 ENCOUNTER — Encounter (HOSPITAL_COMMUNITY): Payer: Self-pay | Admitting: *Deleted

## 2016-10-02 ENCOUNTER — Ambulatory Visit (HOSPITAL_COMMUNITY): Payer: Medicare Other | Admitting: Anesthesiology

## 2016-10-02 DIAGNOSIS — Z992 Dependence on renal dialysis: Secondary | ICD-10-CM | POA: Diagnosis not present

## 2016-10-02 DIAGNOSIS — K228 Other specified diseases of esophagus: Secondary | ICD-10-CM | POA: Insufficient documentation

## 2016-10-02 DIAGNOSIS — K648 Other hemorrhoids: Secondary | ICD-10-CM | POA: Insufficient documentation

## 2016-10-02 DIAGNOSIS — K2980 Duodenitis without bleeding: Secondary | ICD-10-CM

## 2016-10-02 DIAGNOSIS — K2970 Gastritis, unspecified, without bleeding: Secondary | ICD-10-CM | POA: Diagnosis not present

## 2016-10-02 DIAGNOSIS — R1310 Dysphagia, unspecified: Secondary | ICD-10-CM | POA: Diagnosis not present

## 2016-10-02 DIAGNOSIS — R197 Diarrhea, unspecified: Secondary | ICD-10-CM

## 2016-10-02 DIAGNOSIS — E1122 Type 2 diabetes mellitus with diabetic chronic kidney disease: Secondary | ICD-10-CM | POA: Diagnosis not present

## 2016-10-02 DIAGNOSIS — D649 Anemia, unspecified: Secondary | ICD-10-CM

## 2016-10-02 DIAGNOSIS — E1140 Type 2 diabetes mellitus with diabetic neuropathy, unspecified: Secondary | ICD-10-CM | POA: Insufficient documentation

## 2016-10-02 DIAGNOSIS — K625 Hemorrhage of anus and rectum: Secondary | ICD-10-CM

## 2016-10-02 DIAGNOSIS — H5462 Unqualified visual loss, left eye, normal vision right eye: Secondary | ICD-10-CM | POA: Diagnosis not present

## 2016-10-02 DIAGNOSIS — N186 End stage renal disease: Secondary | ICD-10-CM | POA: Diagnosis not present

## 2016-10-02 DIAGNOSIS — I132 Hypertensive heart and chronic kidney disease with heart failure and with stage 5 chronic kidney disease, or end stage renal disease: Secondary | ICD-10-CM | POA: Diagnosis not present

## 2016-10-02 DIAGNOSIS — K6289 Other specified diseases of anus and rectum: Secondary | ICD-10-CM

## 2016-10-02 DIAGNOSIS — Z7982 Long term (current) use of aspirin: Secondary | ICD-10-CM | POA: Insufficient documentation

## 2016-10-02 DIAGNOSIS — K635 Polyp of colon: Secondary | ICD-10-CM | POA: Diagnosis not present

## 2016-10-02 DIAGNOSIS — K621 Rectal polyp: Secondary | ICD-10-CM | POA: Diagnosis not present

## 2016-10-02 DIAGNOSIS — K2950 Unspecified chronic gastritis without bleeding: Secondary | ICD-10-CM | POA: Diagnosis not present

## 2016-10-02 DIAGNOSIS — Z794 Long term (current) use of insulin: Secondary | ICD-10-CM | POA: Insufficient documentation

## 2016-10-02 DIAGNOSIS — R1013 Epigastric pain: Secondary | ICD-10-CM | POA: Diagnosis not present

## 2016-10-02 DIAGNOSIS — I509 Heart failure, unspecified: Secondary | ICD-10-CM | POA: Insufficient documentation

## 2016-10-02 DIAGNOSIS — Z79899 Other long term (current) drug therapy: Secondary | ICD-10-CM | POA: Diagnosis not present

## 2016-10-02 DIAGNOSIS — F17210 Nicotine dependence, cigarettes, uncomplicated: Secondary | ICD-10-CM | POA: Diagnosis not present

## 2016-10-02 DIAGNOSIS — F329 Major depressive disorder, single episode, unspecified: Secondary | ICD-10-CM | POA: Diagnosis not present

## 2016-10-02 DIAGNOSIS — R195 Other fecal abnormalities: Secondary | ICD-10-CM | POA: Diagnosis not present

## 2016-10-02 DIAGNOSIS — D124 Benign neoplasm of descending colon: Secondary | ICD-10-CM | POA: Diagnosis not present

## 2016-10-02 HISTORY — PX: COLONOSCOPY WITH PROPOFOL: SHX5780

## 2016-10-02 HISTORY — PX: SAVORY DILATION: SHX5439

## 2016-10-02 HISTORY — PX: ESOPHAGOGASTRODUODENOSCOPY (EGD) WITH PROPOFOL: SHX5813

## 2016-10-02 LAB — POCT I-STAT, CHEM 8
BUN: 44 mg/dL — ABNORMAL HIGH (ref 6–20)
CALCIUM ION: 1.03 mmol/L — AB (ref 1.15–1.40)
CREATININE: 5.5 mg/dL — AB (ref 0.44–1.00)
Chloride: 98 mmol/L — ABNORMAL LOW (ref 101–111)
GLUCOSE: 77 mg/dL (ref 65–99)
HEMATOCRIT: 39 % (ref 36.0–46.0)
HEMOGLOBIN: 13.3 g/dL (ref 12.0–15.0)
Potassium: 5.3 mmol/L — ABNORMAL HIGH (ref 3.5–5.1)
Sodium: 133 mmol/L — ABNORMAL LOW (ref 135–145)
TCO2: 26 mmol/L (ref 0–100)

## 2016-10-02 LAB — GLUCOSE, CAPILLARY: Glucose-Capillary: 90 mg/dL (ref 65–99)

## 2016-10-02 SURGERY — COLONOSCOPY WITH PROPOFOL
Anesthesia: Monitor Anesthesia Care

## 2016-10-02 MED ORDER — LIDOCAINE VISCOUS 2 % MT SOLN
OROMUCOSAL | Status: AC
  Filled 2016-10-02: qty 15

## 2016-10-02 MED ORDER — CHLORHEXIDINE GLUCONATE CLOTH 2 % EX PADS: 6.0000 | MEDICATED_PAD | Freq: Once | CUTANEOUS | Status: DC

## 2016-10-02 MED ORDER — DEXTROSE 50 % IV SOLN
25.0000 mL | Freq: Once | INTRAVENOUS | Status: AC
  Administered 2016-10-02: 25 mL via INTRAVENOUS

## 2016-10-02 MED ORDER — FENTANYL CITRATE (PF) 100 MCG/2ML IJ SOLN
INTRAMUSCULAR | Status: AC
  Filled 2016-10-02: qty 2

## 2016-10-02 MED ORDER — PROPOFOL 500 MG/50ML IV EMUL
INTRAVENOUS | Status: DC | PRN
  Administered 2016-10-02: 75 ug/kg/min via INTRAVENOUS
  Administered 2016-10-02: 13:00:00 via INTRAVENOUS

## 2016-10-02 MED ORDER — MIDAZOLAM HCL 2 MG/2ML IJ SOLN
1.0000 mg | INTRAMUSCULAR | Status: AC
  Administered 2016-10-02: 2 mg via INTRAVENOUS
  Filled 2016-10-02: qty 2

## 2016-10-02 MED ORDER — DEXTROSE 50 % IV SOLN
INTRAVENOUS | Status: AC
  Filled 2016-10-02: qty 50

## 2016-10-02 MED ORDER — FENTANYL CITRATE (PF) 100 MCG/2ML IJ SOLN
25.0000 ug | Freq: Once | INTRAMUSCULAR | Status: AC
  Administered 2016-10-02: 25 ug via INTRAVENOUS

## 2016-10-02 MED ORDER — SODIUM CHLORIDE 0.9 % IV SOLN
INTRAVENOUS | Status: DC
  Administered 2016-10-02: 500 mL via INTRAVENOUS

## 2016-10-02 NOTE — Anesthesia Procedure Notes (Signed)
Procedure Name: MAC Date/Time: 10/02/2016 12:23 PM Performed by: Andree Elk, AMY A Pre-anesthesia Checklist: Patient identified, Emergency Drugs available, Suction available, Patient being monitored and Timeout performed Oxygen Delivery Method: Simple face mask

## 2016-10-02 NOTE — H&P (Signed)
Primary Care Physician:  Kenn File, MD Primary Gastroenterologist:  Dr. Oneida Alar  Pre-Procedure History & Physical: HPI:  Kara Knapp is a 46 y.o. female here for Diarrhea/DYSPHAGIA/dyspepsia/brbpr.  Past Medical History:  Diagnosis Date  . Anemia of chronic disease   . Asthma   . Blind left eye   . Bronchitis   . Cataract   . Cholecystitis, acute 05/26/2013   Status post cholecystectomy  . Depression   . Diabetic foot ulcer ([place]) 03/01/2015  . Dialysis patient [facility])    M-W-F; [facility] in [place]  . ESRD on hemodialysis ([place])    Started diaylsis 12/29/15, T, TH, Sat  . Essential hypertension   . Fibroids   . Glaucoma   . History of blood transfusion   . History of pneumonia   . Hyperlipidemia   . Insulin-dependent diabetes mellitus with retinopathy ([place])   . Neuropathy ([place])   . Osteomyelitis ([place])    Toe on left foot    Past Surgical History:  Procedure Laterality Date  . AV FISTULA PLACEMENT Right 10/17/2015   Procedure: INSERTION OF ARTERIOVENOUS GORE-TEX GRAFT RIGHT UPPER ARM WITH ACUSEAL;  Surgeon: Conrad Takilma, MD;  Location: [facility];  Service: Vascular;  Laterality: Right;  . CATARACT EXTRACTION W/ INTRAOCULAR LENS IMPLANT Bilateral   . CESAREAN SECTION    . CHOLECYSTECTOMY N/A 05/25/2013   Procedure: LAPAROSCOPIC CHOLECYSTECTOMY;  Surgeon: Jamesetta So, MD;  Location: [facility];  Service: General;  Laterality: N/A;  . EYE SURGERY    . PARS PLANA VITRECTOMY Left 11/24/2014   Procedure: PARS PLANA VITRECTOMY WITH 25 GAUGE;  Surgeon: Hurman Horn, MD;  Location: [facility];  Service: Ophthalmology;  Laterality: Left;  . PERIPHERAL VASCULAR CATHETERIZATION N/A 04/28/2015   Procedure: Bilateral Upper Extremity Venography;  Surgeon: Conrad Crane, MD;  Location: [facility];  Service: Cardiovascular;  Laterality: N/A;  . PHOTOCOAGULATION WITH LASER Left 11/24/2014   Procedure: PHOTOCOAGULATION WITH LASER;  Surgeon: Hurman Horn, MD;  Location: [facility];  Service:  Ophthalmology;  Laterality: Left;  with insertion of silicone oil  . TUBAL LIGATION      Prior to Admission medications   Medication Sig Start Date End Date Taking? Authorizing Provider  acetaminophen (TYLENOL) 325 MG tablet Take 650 mg by mouth every 6 (six) hours as needed for mild pain.   Yes Historical Provider, MD  aspirin 81 MG tablet Take 81 mg by mouth daily.   Yes Historical Provider, MD  blood glucose meter kit and supplies KIT Podigy Voice GLucometer - patient is visually impaired and needs meter that reads results.  Use to check BG up to QID.  Dx: E11.319 Z79.4 05/14/15  Yes Cherre Robins, PharmD  blood glucose meter kit and supplies KIT Dispense based on patient and insurance preference. Use up to four times daily as directed. (FOR ICD-9 250.00, 250.01). 04/27/16  Yes Wardell Honour, MD  Blood Glucose Monitoring Suppl (PRODIGY VOICE BLOOD GLUCOSE) w/Device KIT 1 Device by Does not apply route 2 (two) times daily. 08/31/16  Yes Timmothy Euler, MD  carvedilol (COREG) 12.5 MG tablet Take 1 tablet (12.5 mg total) by mouth 2 (two) times daily. 06/13/16  Yes Timmothy Euler, MD  cholecalciferol (VITAMIN D) 1000 units tablet Take 1,000 Units by mouth every Monday, Wednesday, and Friday with hemodialysis.    Yes Historical Provider, MD  citalopram (CELEXA) 20 MG tablet Take 1 tablet (20 mg total) by mouth daily. Reported on 08/12/2015 09/18/16  Yes Timmothy Euler, MD  fluticasone (FLONASE) 50 MCG/ACT nasal spray Place 2 sprays into both nostrils daily as needed for allergies. 09/05/16  Yes Fransisca Kaufmann Dettinger, MD  gabapentin (NEURONTIN) 300 MG capsule Take 300 mg by mouth 2 (two) times daily.   Yes Historical Provider, MD  glucose blood test strip Use as instructed 08/29/16  Yes Timmothy Euler, MD  hydrALAZINE (APRESOLINE) 25 MG tablet Take 1 tablet (25 mg total) by mouth 3 (three) times daily. Patient taking differently: Take 25 mg by mouth 2 (two) times daily.  07/08/15  Yes Fransisca Kaufmann  Dettinger, MD  insulin lispro (HUMALOG KWIKPEN) 100 UNIT/ML KiwkPen INJECT 7-10 UNITS INTO THE SKIN THREE TIMES DAILY. Patient taking differently: Inject 7-10 Units into the skin 3 (three) times daily with meals. Per sliding scale 06/13/16  Yes Timmothy Euler, MD  polyethylene glycol-electrolytes (TRILYTE) 420 g solution Take 4,000 mLs by mouth as directed. Patient taking differently: Take 4,000 mLs by mouth as directed. Will do prior to procedure 09/24/16  Yes Danie Binder, MD  sevelamer carbonate (RENVELA) 800 MG tablet Take 2,400 mg by mouth 3 (three) times daily with meals. And with snacks.   Yes Historical Provider, MD  cephALEXin (KEFLEX) 500 MG capsule Take 1 capsule (500 mg total) by mouth 4 (four) times daily. Patient not taking: Reported on 09/25/2016 09/05/16   Fransisca Kaufmann Dettinger, MD  gabapentin (NEURONTIN) 100 MG capsule Take 1 capsule (100 mg total) by mouth 3 (three) times daily. Patient not taking: Reported on 09/25/2016 07/31/16   Timmothy Euler, MD    Allergies as of 09/24/2016 - Review Complete 09/07/2016  Allergen Reaction Noted  . Bactrim [sulfamethoxazole-trimethoprim] Nausea And Vomiting 05/21/2013  . Prednisone Other (See Comments) 07/09/2013    Family History  Problem Relation Age of Onset  . COPD Mother   . Cancer Father   . Lymphoma Father   . Diabetes Sister   . Deep vein thrombosis Sister   . Diabetes Brother   . Hyperlipidemia Brother   . Hypertension Brother   . Mental retardation Sister   . Colon cancer Neg Hx   . Liver disease Neg Hx     Social History   Social History  . Marital status: Legally Separated    Spouse name: N/A  . Number of children: N/A  . Years of education: N/A   Occupational History  . Not on file.   Social History Main Topics  . Smoking status: Current Every Day Smoker    Packs/day: 1.00    Years: 21.00    Types: Cigarettes  . Smokeless tobacco: Never Used     Comment: one pack daily  . Alcohol use No  . Drug use:  No  . Sexual activity: Yes    Birth control/ protection: Surgical   Other Topics Concern  . Not on file   Social History Narrative  . No narrative on file    Review of Systems: See HPI, otherwise negative ROS   Physical Exam: BP (!) 194/86   Pulse (!) 58   Temp 97.9 F (36.6 C)   Resp 20   LMP 05/05/2015   SpO2 93%  General:   Alert,  pleasant and cooperative in NAD Head:  Normocephalic and atraumatic. Neck:  Supple; Lungs:  Clear throughout to auscultation.    Heart:  Regular rate and rhythm. Abdomen:  Soft, nontender and nondistended. Normal bowel sounds, without guarding, and without rebound.   Neurologic:  Alert and  oriented x4;  grossly normal neurologically.  Impression/Plan:     Diarrhea/DYSPHAGIA/dyspepsia/brbpr   PLAN: TCS/EGD/dil TODAY WITH BIOPSY of colon and small bowel. DISCUSSED PROCEDURE, BENEFITS, & RISKS: < 1% chance of medication reaction, bleeding, perforation, or rupture of spleen/liver.

## 2016-10-02 NOTE — Transfer of Care (Signed)
Immediate Anesthesia Transfer of Care Note  Patient: Kara Knapp  Procedure(s) Performed: Procedure(s) with comments: COLONOSCOPY WITH PROPOFOL (N/A) - 145 - pt knows to arrive at 11:15 per office ESOPHAGOGASTRODUODENOSCOPY (EGD) WITH PROPOFOL (N/A) SAVORY DILATION (N/A)  Patient Location: PACU  Anesthesia Type:MAC  Level of Consciousness: awake, oriented and patient cooperative  Airway & Oxygen Therapy: Patient Spontanous Breathing and Patient connected to face mask oxygen  Post-op Assessment: Report given to RN and Post -op Vital signs reviewed and stable  Post vital signs: Reviewed and stable  Last Vitals:  Vitals:   10/02/16 1130  BP: (!) 194/86  Pulse: (!) 58  Resp: 20  Temp: 36.6 C    Last Pain: There were no vitals filed for this visit.    Patients Stated Pain Goal: 8 (31/12/16 2446)  Complications: No apparent anesthesia complications

## 2016-10-02 NOTE — Anesthesia Preprocedure Evaluation (Signed)
Anesthesia Evaluation  Patient identified by MRN, date of birth, ID band Patient awake    Reviewed: Allergy & Precautions, NPO status , Patient's Chart, lab work & pertinent test results  Airway Mallampati: II  TM Distance: >3 FB Neck ROM: Full    Dental  (+) Edentulous Upper, Edentulous Lower, Dental Advisory Given   Pulmonary asthma , Current Smoker,    breath sounds clear to auscultation       Cardiovascular hypertension, Pt. on medications and Pt. on home beta blockers +CHF   Rhythm:Regular Rate:Normal     Neuro/Psych PSYCHIATRIC DISORDERS Depression    GI/Hepatic   Endo/Other  diabetes, Type 1, Insulin Dependent  Renal/GU ESRFRenal disease     Musculoskeletal   Abdominal (+) + obese,   Peds  Hematology  (+) anemia , Hgb 8.8   Anesthesia Other Findings   Reproductive/Obstetrics                             Anesthesia Physical Anesthesia Plan  ASA: IV  Anesthesia Plan: MAC   Post-op Pain Management:    Induction: Intravenous  Airway Management Planned: Simple Face Mask  Additional Equipment:   Intra-op Plan:   Post-operative Plan:   Informed Consent: I have reviewed the patients History and Physical, chart, labs and discussed the procedure including the risks, benefits and alternatives for the proposed anesthesia with the patient or authorized representative who has indicated his/her understanding and acceptance.     Plan Discussed with:   Anesthesia Plan Comments:         Anesthesia Quick Evaluation

## 2016-10-02 NOTE — Discharge Instructions (Addendum)
You had 5 polyps removed. I STRETCHED YOUR ESOPHAGUS DUE YOUR PROBLEMS SWALLOWING. IT IS MOST LIKELY DUE TO ACID REFLUX.  I REMOVED A BENIGN APPEARING NODULE FROM YOUR ESOPHAGUS. YOUR RECTAL BLEEDING IS DUE TO INTERNAL HEMORRHOIDS.  I BIOPSIED YOUR STOMACH AND SMALL BOWEL.   No heparin with dialysis until Mar 12.   - Kara Shivers, RN @ [facility] made aware Kara Guise, RN)  DRINK WATER TO KEEP YOUR URINE LIGHT YELLOW.  FOLLOW A LOW FAT DIET. AVOID ITEMS THAT CAUSE BLOATING. .MEATS SHOULD BE BAKED, BROILED, OR BOILED. AVOID FRIED FOODS.SEE INFO BELOW.  YOUR BIOPSY RESULTS WILL BE AVAILABLE IN MY CHART AFTER MAR 9 AND MY OFFICE WILL CONTACT YOU IN 10-14 DAYS WITH YOUR RESULTS.   ADD PROTONIX. TAKE 30 MINUTES PRIOR TO BREAKFAST TO TREAT GASTRITIS AND PREVENT ACID REFLUX.  Follow up in 4 mos.  Next colonoscopy in 5-10 years.   ENDOSCOPY Care After Read the instructions outlined below and refer to this sheet in the next week. These discharge instructions provide you with general information on caring for yourself after you leave the hospital. While your treatment has been planned according to the most current medical practices available, unavoidable complications occasionally occur. If you have any problems or questions after discharge, call DR. FIELDS, [phone].  ACTIVITY  You may resume your regular activity, but move at a slower pace for the next 24 hours.   Take frequent rest periods for the next 24 hours.   Walking will help get rid of the air and reduce the bloated feeling in your belly (abdomen).   No driving for 24 hours (because of the medicine (anesthesia) used during the test).   You may shower.   Do not sign any important legal documents or operate any machinery for 24 hours (because of the anesthesia used during the test).    NUTRITION  Drink plenty of fluids.   You may resume your normal diet as instructed by your doctor.   Begin with a light meal and  progress to your normal diet. Heavy or fried foods are harder to digest and may make you feel sick to your stomach (nauseated).   Avoid alcoholic beverages for 24 hours or as instructed.    MEDICATIONS  You may resume your normal medications.   WHAT YOU CAN EXPECT TODAY  Some feelings of bloating in the abdomen.   Passage of more gas than usual.   Spotting of blood in your stool or on the toilet paper  .  IF YOU HAD POLYPS REMOVED DURING THE ENDOSCOPY:  Eat a soft diet IF YOU HAVE NAUSEA, BLOATING, ABDOMINAL PAIN, OR VOMITING.    FINDING OUT THE RESULTS OF YOUR TEST Not all test results are available during your visit. DR. Oneida Alar WILL CALL YOU WITHIN 7 DAYS OF YOUR PROCEDUE WITH YOUR RESULTS. Do not assume everything is normal if you have not heard from DR. FIELDS IN ONE WEEK, CALL HER OFFICE AT [phone].  SEEK IMMEDIATE MEDICAL ATTENTION AND CALL THE OFFICE: [phone] IF:  You have more than a spotting of blood in your stool.   Your belly is swollen (abdominal distention).   You are nauseated or vomiting.   You have a temperature over 101F.   You have abdominal pain or discomfort that is severe or gets worse throughout the day.   High-Fiber Diet A high-fiber diet changes your normal diet to include more whole grains, legumes, fruits, and vegetables. Changes in the diet involve replacing  refined carbohydrates with unrefined foods. The calorie level of the diet is essentially unchanged. The Dietary Reference Intake (recommended amount) for adult males is 38 grams per day. For adult females, it is 25 grams per day. Pregnant and lactating women should consume 28 grams of fiber per day. Fiber is the intact part of a plant that is not broken down during digestion. Functional fiber is fiber that has been isolated from the plant to provide a beneficial effect in the body. PURPOSE  Increase stool bulk.   Ease and regulate bowel movements.   Lower cholesterol.    INDICATIONS THAT YOU NEED MORE FIBER  Constipation and hemorrhoids.   Uncomplicated diverticulosis (intestine condition) and irritable bowel syndrome.   Weight management.   As a protective measure against hardening of the arteries (atherosclerosis), diabetes, and cancer.   GUIDELINES FOR INCREASING FIBER IN THE DIET  Start adding fiber to the diet slowly. A gradual increase of about 5 more grams (2 slices of whole-wheat bread, 2 servings of most fruits or vegetables, or 1 bowl of high-fiber cereal) per day is best. Too rapid an increase in fiber may result in constipation, flatulence, and bloating.   Drink enough water and fluids to keep your urine clear or pale yellow. Water, juice, or caffeine-free drinks are recommended. Not drinking enough fluid may cause constipation.   Eat a variety of high-fiber foods rather than one type of fiber.   Try to increase your intake of fiber through using high-fiber foods rather than fiber pills or supplements that contain small amounts of fiber.   The goal is to change the types of food eaten. Do not supplement your present diet with high-fiber foods, but replace foods in your present diet.  INCLUDE A VARIETY OF FIBER SOURCES  Replace refined and processed grains with whole grains, canned fruits with fresh fruits, and incorporate other fiber sources. White rice, white breads, and most bakery goods contain little or no fiber.   Brown whole-grain rice, buckwheat oats, and many fruits and vegetables are all good sources of fiber. These include: broccoli, Brussels sprouts, cabbage, cauliflower, beets, sweet potatoes, white potatoes (skin on), carrots, tomatoes, eggplant, squash, berries, fresh fruits, and dried fruits.   Cereals appear to be the richest source of fiber. Cereal fiber is found in whole grains and bran. Bran is the fiber-rich outer coat of cereal grain, which is largely removed in refining. In whole-grain cereals, the bran remains. In  breakfast cereals, the largest amount of fiber is found in those with "bran" in their names. The fiber content is sometimes indicated on the label.   You may need to include additional fruits and vegetables each day.   In baking, for 1 cup white flour, you may use the following substitutions:   1 cup whole-wheat flour minus 2 tablespoons.   1/2 cup white flour plus 1/2 cup whole-wheat flour.    Polyps, Colon  A polyp is extra tissue that grows inside your body. Colon polyps grow in the large intestine. The large intestine, also called the colon, is part of your digestive system. It is a long, hollow tube at the end of your digestive tract where your body makes and stores stool. Most polyps are not dangerous. They are benign. This means they are not cancerous. But over time, some types of polyps can turn into cancer. Polyps that are smaller than a pea are usually not harmful. But larger polyps could someday become or may already be cancerous. To be safe,  doctors remove all polyps and test them.   WHO GETS POLYPS? Anyone can get polyps, but certain people are more likely than others. You may have a greater chance of getting polyps if:  You are over 50.   You have had polyps before.   Someone in your family has had polyps.   Someone in your family has had cancer of the large intestine.   Find out if someone in your family has had polyps. You may also be more likely to get polyps if you:   Eat a lot of fatty foods   Smoke   Drink alcohol   Do not exercise  Eat too much   TREATMENT  The caregiver will remove the polyp during sigmoidoscopy or colonoscopy.  PREVENTION There is not one sure way to prevent polyps. You might be able to lower your risk of getting them if you:  Eat more fruits and vegetables and less fatty food.   Do not smoke.   Avoid alcohol.   Exercise every day.   Lose weight if you are overweight.   Eating more calcium and folate can also lower your risk of  getting polyps. Some foods that are rich in calcium are milk, cheese, and broccoli. Some foods that are rich in folate are chickpeas, kidney beans, and spinach.    Hiatal Hernia A hiatal hernia occurs when a part of the stomach slides above the diaphragm. The diaphragm is the thin muscle separating the belly (abdomen) from the chest. A hiatal hernia can be something you are born with or develop over time. Hiatal hernias may allow stomach acid to flow back into your esophagus, the tube which carries food from your mouth to your stomach. If this acid causes problems it is called GERD (gastro-esophageal reflux disease).   SYMPTOMS Common symptoms of GERD are heartburn (burning in your chest). This is worse when lying down or bending over. It may also cause belching and indigestion. Some of the things which make GERD worse are:  Increased weight pushes on stomach making acid rise more easily.   Smoking markedly increases acid production.   Alcohol decreases lower esophageal sphincter pressure (valve between stomach and esophagus), allowing acid from stomach into esophagus.   Late evening meals and going to bed with a full stomach increases pressure.   Anything that causes an increase in acid production.    HOME CARE INSTRUCTIONS  Try to achieve and maintain an ideal body weight.   Avoid drinking alcoholic beverages.   DO NOT smokE.   Do not wear tight clothing around your chest or stomach.   Eat smaller meals and eat more frequently. This keeps your stomach from getting too full. Eat slowly.   Do not lie down for 2 or 3 hours after eating. Do not eat or drink anything 1 to 2 hours before going to bed.   Avoid caffeine beverages (colas, coffee, cocoa, tea), fatty foods, citrus fruits and all other foods and drinks that contain acid and that seem to increase the problems.   Avoid bending over, especially after eating OR STRAINING. Anything that increases the pressure in your belly  increases the amount of acid that may be pushed up into your esophagus.   Gastritis  Gastritis is an inflammation (the body's way of reacting to injury and/or infection) of the stomach. It is often caused by viral or bacterial (germ) infections. It can also be caused BY ASPIRIN, BC/GOODY POWDER'S, (IBUPROFEN) MOTRIN, OR ALEVE (NAPROXEN), chemicals (including alcohol),  SPICY FOODS, and medications. This illness may be associated with generalized malaise (feeling tired, not well), UPPER ABDOMINAL STOMACH cramps, and fever. One common bacterial cause of gastritis is an organism known as H. Pylori. This can be treated with antibiotics.

## 2016-10-02 NOTE — Op Note (Signed)
[facility] Patient Name: Kara Knapp Procedure Date: 10/02/2016 12:15 PM MRN: [id number] Date of Birth: 1971-04-10 Attending MD: Barney Drain , MD CSN: [id number] Age: 46 Admit Type: Outpatient Procedure:                Colonoscopy WITH COLD FORCEPS BIOPSY/POLYPECTOMY                            AND SNARE POLYPECTOMY Indications:              Abdominal pain, Clinically significant diarrhea of                            unexplained origin, Anemia Providers:                Barney Drain, MD, Hinton Rao, RN, Aram Candela Referring MD:             Sherley Bounds. Wendi Snipes Medicines:                Propofol per Anesthesia Complications:            No immediate complications. Estimated Blood Loss:     Estimated blood loss was minimal. Procedure:                Pre-Anesthesia Assessment:                           - Prior to the procedure, a History and Physical                            was performed, and patient medications and                            allergies were reviewed. The patient's tolerance of                            previous anesthesia was also reviewed. The risks                            and benefits of the procedure and the sedation                            options and risks were discussed with the patient.                            All questions were answered, and informed consent                            was obtained. Prior Anticoagulants: The patient has                            taken aspirin. ASA Grade Assessment: II - A patient                            with mild systemic disease. After reviewing the  risks and benefits, the patient was deemed in                            satisfactory condition to undergo the procedure.                            After obtaining informed consent, the colonoscope                            was passed under direct vision. Throughout the                            procedure, the patient's blood  pressure, pulse, and                            oxygen saturations were monitored continuously. The                            [id number] ([id number]) scope was introduced through                            the anus and advanced to the 10 cm into the ileum.                            The colonoscopy was somewhat difficult due to a                            tortuous colon. Successful completion of the                            procedure was aided by COLOWRAP. The patient                            tolerated the procedure well. The quality of the                            bowel preparation was good. The terminal ileum,                            ileocecal valve, appendiceal orifice, and rectum                            were photographed. Scope In: 12:43:46 PM Scope Out: 1:09:12 PM Scope Withdrawal Time: 0 hours 22 minutes 12 seconds  Total Procedure Duration: 0 hours 25 minutes 26 seconds  Findings:      The terminal ileum appeared normal.      Two sessile polyps were found in the descending colon. The polyps were 3       to 5 mm in size. These polyps were removed with a cold snare. Resection       and retrieval were complete.      Three sessile polyps were found in the rectum. The polyps were 2 to 3 mm       in size. These polyps were removed with  a cold biopsy forceps. Resection       and retrieval were complete.      The descending colon, transverse colon and ascending colon appeared       normal. Biopsies for histology were taken with a cold forceps from the       ascending colon, transverse colon and descending colon for evaluation of       microscopic colitis.      Internal hemorrhoids were found during retroflexion. The hemorrhoids       were small.      A patchy area of mildly friable mucosa with no bleeding was found at the       anus. Biopsies were taken with a cold forceps for histology. Impression:               - NO SOURCE FOR DIARRHEA IDENTIFIED.                            - Two 3 to 5 mm polyps in the descending colon,                            removed with a cold snare. Resected and retrieved.                           - Three 2 to 3 mm polyps in the rectum, removed                            with a cold biopsy forceps. Resected and retrieved.                           - The descending colon, transverse colon and                            ascending colon are normal. Biopsied.                           - RECTAL BLEEDING DUE TO Internal hemorrhoids.                           - Friability with no bleeding at the anus. Biopsied. Moderate Sedation:      Per Anesthesia Care Recommendation:           - High fiber diet and low fat diet.                           - Continue present medications.                           - Await pathology results. NO HEPARIN WITH DIALYSIS                            UNTIL MAR 12.                           - Repeat colonoscopy in 5-10 years for surveillance.                           -  Return to GI office in 4 months.                           - Patient has a contact number available for                            emergencies. The signs and symptoms of potential                            delayed complications were discussed with the                            patient. Return to normal activities tomorrow.                            Written discharge instructions were provided to the                            patient. Procedure Code(s):        --- Professional ---                           [phone], Colonoscopy, flexible; with removal of                            tumor(s), polyp(s), or other lesion(s) by snare                            technique                           [id number], 68, Colonoscopy, flexible; with biopsy,                            single or multiple Diagnosis Code(s):        --- Professional ---                           K64.8, Other hemorrhoids                           D12.4, Benign neoplasm of descending colon                            K62.1, Rectal polyp                           K62.89, Other specified diseases of anus and rectum                           R10.9, Unspecified abdominal pain                           R19.7, Diarrhea, unspecified                           D64.9, Anemia, unspecified CPT copyright 2016 American  Medical Association. All rights reserved. The codes documented in this report are preliminary and upon coder review may  be revised to meet current compliance requirements. Barney Drain, MD Barney Drain, MD 10/02/2016 1:47:43 PM This report has been signed electronically. Number of Addenda: 0

## 2016-10-02 NOTE — Anesthesia Postprocedure Evaluation (Signed)
Anesthesia Post Note  Patient: Kara Knapp  Procedure(s) Performed: Procedure(s) (LRB): COLONOSCOPY WITH PROPOFOL (N/A) ESOPHAGOGASTRODUODENOSCOPY (EGD) WITH PROPOFOL (N/A) SAVORY DILATION (N/A)  Patient location during evaluation: PACU Anesthesia Type: MAC Level of consciousness: oriented and awake Pain management: pain level controlled Vital Signs Assessment: post-procedure vital signs reviewed and stable Respiratory status: spontaneous breathing Postop Assessment: no signs of nausea or vomiting Anesthetic complications: no     Last Vitals:  Vitals:   10/02/16 1130  BP: (!) 194/86  Pulse: (!) 58  Resp: 20  Temp: 36.6 C    Last Pain: There were no vitals filed for this visit.               ADAMS, AMY A

## 2016-10-02 NOTE — Op Note (Signed)
[facility] Patient Name: Kara Knapp Procedure Date: 10/02/2016 1:11 PM MRN: [id number] Date of Birth: [birth year] Attending MD: Barney Drain , MD CSN: [id number] Age: 46 Admit Type: Outpatient Procedure:                Upper GI endoscopyWITH COLD FORCEPS Indications:              Dyspepsia, Dysphagia, Anemia Providers:                Barney Drain, MD, Hinton Rao, RN, Aram Candela Referring MD:             Sherley Bounds. Wendi Snipes Medicines:                Propofol per Anesthesia Complications:            No immediate complications. Estimated Blood Loss:     Estimated blood loss was minimal. Procedure:                Pre-Anesthesia Assessment:                           - Prior to the procedure, a History and Physical                            was performed, and patient medications and                            allergies were reviewed. The patient's tolerance of                            previous anesthesia was also reviewed. The risks                            and benefits of the procedure and the sedation                            options and risks were discussed with the patient.                            All questions were answered, and informed consent                            was obtained. Prior Anticoagulants: The patient has                            taken aspirin. ASA Grade Assessment: II - A patient                            with mild systemic disease. After reviewing the                            risks and benefits, the patient was deemed in                            satisfactory condition to undergo the procedure.  After obtaining informed consent, the endoscope was                            passed under direct vision. Throughout the                            procedure, the patient's blood pressure, pulse, and                            oxygen saturations were monitored continuously. The                            [id number]  ([id number]) scope was introduced through the                            mouth, and advanced to the second part of duodenum.                            The upper GI endoscopy was accomplished without                            difficulty. The patient tolerated the procedure                            well. Scope In: 1:18:53 PM Scope Out: 1:28:24 PM Total Procedure Duration: 0 hours 9 minutes 31 seconds  Findings:      A single 5 mm mucosal nodule was found at the gastroesophageal junction.       Biopsies were taken with a cold forceps for histology.      Patchy moderate inflammation characterized by congestion (edema),       erosions and erythema was found in the gastric fundus, in the gastric       body and in the gastric antrum. Biopsies were taken with a cold forceps       for Helicobacter pylori testing.      Patchy mild inflammation characterized by erosions was found in the       duodenal bulb.      The second portion of the duodenum was normal. Biopsies for histology       were taken with a cold forceps for evaluation of celiac disease. Impression:               - Mucosal nodule found in the esophagus.                           - MODERATE Gastritis. Biopsied.                           - MILD Duodenitis. Moderate Sedation:      Per Anesthesia Care Recommendation:           - Await pathology results. NO HEPARIN WITH DIALYSIS                            UNTIL MAR 12.                           -  High fiber diet and low fat diet.                           - Continue present medications. PROTONIX DAILY.                           - Await pathology results.                           - Return to my office in 4 months.                           - Patient has a contact number available for                            emergencies. The signs and symptoms of potential                            delayed complications were discussed with the                            patient. Return to normal  activities tomorrow.                            Written discharge instructions were provided to the                            patient. Procedure Code(s):        --- Professional ---                           [phone], Esophagogastroduodenoscopy, flexible,                            transoral; with biopsy, single or multiple Diagnosis Code(s):        --- Professional ---                           K22.8, Other specified diseases of esophagus                           K29.70, Gastritis, unspecified, without bleeding                           K29.80, Duodenitis without bleeding                           R10.13, Epigastric pain                           R13.10, Dysphagia, unspecified                           D64.9, Anemia, unspecified CPT copyright 2016 American Medical Association. All rights reserved. The codes documented in this report are preliminary and upon coder review may  be revised to meet current compliance requirements. Barney Drain, MD Kara Knapp  Kara Aikey, MD 10/02/2016 1:52:48 PM This report has been signed electronically. Number of Addenda: 0

## 2016-10-03 DIAGNOSIS — E162 Hypoglycemia, unspecified: Secondary | ICD-10-CM | POA: Diagnosis not present

## 2016-10-03 DIAGNOSIS — N186 End stage renal disease: Secondary | ICD-10-CM | POA: Diagnosis not present

## 2016-10-03 DIAGNOSIS — D631 Anemia in chronic kidney disease: Secondary | ICD-10-CM | POA: Diagnosis not present

## 2016-10-03 DIAGNOSIS — D509 Iron deficiency anemia, unspecified: Secondary | ICD-10-CM | POA: Diagnosis not present

## 2016-10-03 DIAGNOSIS — Z992 Dependence on renal dialysis: Secondary | ICD-10-CM | POA: Diagnosis not present

## 2016-10-03 DIAGNOSIS — N250 Renal osteodystrophy: Secondary | ICD-10-CM | POA: Diagnosis not present

## 2016-10-03 NOTE — Progress Notes (Signed)
LM for a return call.

## 2016-10-04 ENCOUNTER — Telehealth: Payer: Self-pay | Admitting: Internal Medicine

## 2016-10-04 DIAGNOSIS — K219 Gastro-esophageal reflux disease without esophagitis: Secondary | ICD-10-CM

## 2016-10-04 NOTE — Telephone Encounter (Signed)
This is an [facility] pt- she recently had EGD with [facility] and it is in [facility] discharge instructions from her procedure.

## 2016-10-04 NOTE — Telephone Encounter (Signed)
I cannot see anywhere where that's documented. When did he tell her to take it? Do we have a note or something about it? Dose? Frequency?  Thanks

## 2016-10-04 NOTE — Telephone Encounter (Signed)
RMR TOLD HER TO TAKE PROTONIX AND SHE WOULD LIKE A PRESCRIPTION SENT TO [facility] IN [place]

## 2016-10-04 NOTE — Progress Notes (Signed)
I called and informed the pt. She wants to know why she cannot have another [place] before 6 months, if we think she has cirrhosis. I told her that it would not be much different if we repeated right away. She wants to know why can't she get rid of the fluid now. She said she has a lot of questions and would like a call from Neil Crouch, [place].  I told her it would probably not be today, but I would let Magda Paganini know.

## 2016-10-04 NOTE — Telephone Encounter (Signed)
Routing to the refill box. 

## 2016-10-05 DIAGNOSIS — Z992 Dependence on renal dialysis: Secondary | ICD-10-CM | POA: Diagnosis not present

## 2016-10-05 DIAGNOSIS — D509 Iron deficiency anemia, unspecified: Secondary | ICD-10-CM | POA: Diagnosis not present

## 2016-10-05 DIAGNOSIS — N186 End stage renal disease: Secondary | ICD-10-CM | POA: Diagnosis not present

## 2016-10-05 DIAGNOSIS — D631 Anemia in chronic kidney disease: Secondary | ICD-10-CM | POA: Diagnosis not present

## 2016-10-05 DIAGNOSIS — N250 Renal osteodystrophy: Secondary | ICD-10-CM | POA: Diagnosis not present

## 2016-10-05 DIAGNOSIS — E162 Hypoglycemia, unspecified: Secondary | ICD-10-CM | POA: Diagnosis not present

## 2016-10-08 ENCOUNTER — Encounter (HOSPITAL_COMMUNITY): Payer: Self-pay | Admitting: Gastroenterology

## 2016-10-09 ENCOUNTER — Ambulatory Visit (INDEPENDENT_AMBULATORY_CARE_PROVIDER_SITE_OTHER): Payer: Medicare Other | Admitting: Vascular Surgery

## 2016-10-09 ENCOUNTER — Other Ambulatory Visit: Payer: Self-pay

## 2016-10-09 ENCOUNTER — Encounter: Payer: Self-pay | Admitting: Vascular Surgery

## 2016-10-09 ENCOUNTER — Ambulatory Visit (HOSPITAL_COMMUNITY)
Admission: RE | Admit: 2016-10-09 | Discharge: 2016-10-09 | Disposition: A | Discharge status: Home / Self Care | Payer: Medicare Other | Source: Ambulatory Visit | Attending: Vascular Surgery | Admitting: Vascular Surgery

## 2016-10-09 VITALS — BP 166/85 | HR 56 | Temp 96.9°F | Resp 20 | Ht 63.0 in | Wt 171.0 lb

## 2016-10-09 DIAGNOSIS — E162 Hypoglycemia, unspecified: Secondary | ICD-10-CM | POA: Diagnosis not present

## 2016-10-09 DIAGNOSIS — D509 Iron deficiency anemia, unspecified: Secondary | ICD-10-CM | POA: Diagnosis not present

## 2016-10-09 DIAGNOSIS — D631 Anemia in chronic kidney disease: Secondary | ICD-10-CM | POA: Diagnosis not present

## 2016-10-09 DIAGNOSIS — T8249XD Other complication of vascular dialysis catheter, subsequent encounter: Secondary | ICD-10-CM | POA: Diagnosis not present

## 2016-10-09 DIAGNOSIS — N250 Renal osteodystrophy: Secondary | ICD-10-CM | POA: Diagnosis not present

## 2016-10-09 DIAGNOSIS — Z992 Dependence on renal dialysis: Secondary | ICD-10-CM | POA: Diagnosis not present

## 2016-10-09 DIAGNOSIS — Y838 Other surgical procedures as the cause of abnormal reaction of the patient, or of later complication, without mention of misadventure at the time of the procedure: Secondary | ICD-10-CM | POA: Insufficient documentation

## 2016-10-09 DIAGNOSIS — N186 End stage renal disease: Secondary | ICD-10-CM | POA: Diagnosis not present

## 2016-10-09 DIAGNOSIS — T82398D Other mechanical complication of other vascular grafts, subsequent encounter: Secondary | ICD-10-CM | POA: Diagnosis not present

## 2016-10-09 DIAGNOSIS — N185 Chronic kidney disease, stage 5: Secondary | ICD-10-CM | POA: Diagnosis not present

## 2016-10-09 MED ORDER — PANTOPRAZOLE SODIUM 40 MG PO TBEC: 40.0000 mg | DELAYED_RELEASE_TABLET | Freq: Every day | ORAL | 1 refills | Status: DC

## 2016-10-09 NOTE — Progress Notes (Signed)
[facility] and [facility]in Specialist of [facility]  Patient name: Kara Knapp MRN: [id number] DOB: 1971/04/24 Sex: female  REASON FOR VISIT: Evaluation right upper arm AV graft  HPI: Kara Knapp is a 46 y.o. female who is status post placement of a Damali Broadfoot access right upper arm Accuseal graft with Dr. Bridgett Larsson on 10/14/2015. She began using this for hemodialysis in June 2017. She's had no thromboses or other difficulty. She is seen today due to difficulty with cannulization and flow. She has no steal symptoms. She currently undergoes hemodialysis on Monday Wednesday and Friday but may be switching to Tuesday Thursday Saturday in the near future. She denies any discomfort associated with her dialysis access.   Past Medical History:  Diagnosis Date  . Anemia of chronic disease   . Asthma   . Blind left eye   . Bronchitis   . Cataract   . Cholecystitis, acute 05/26/2013   Status post cholecystectomy  . Depression   . Diabetic foot ulcer ([place]) 03/01/2015  . Dialysis patient [facility])    M-W-F; [facility] in [place]  . ESRD on hemodialysis ([place])    Started diaylsis 12/29/15, T, TH, Sat  . Essential hypertension   . Fibroids   . Glaucoma   . History of blood transfusion   . History of pneumonia   . Hyperlipidemia   . Insulin-dependent diabetes mellitus with retinopathy ([place])   . Neuropathy ([place])   . Osteomyelitis ([place])    Toe on left foot    Family History  Problem Relation Age of Onset  . COPD Mother   . Cancer Father   . Lymphoma Father   . Diabetes Sister   . Deep vein thrombosis Sister   . Diabetes Brother   . Hyperlipidemia Brother   . Hypertension Brother   . Mental retardation Sister   . Colon cancer Neg Hx   . Liver disease Neg Hx     SOCIAL HISTORY: Social History  Substance Use Topics  . Smoking status: Current Every Day Smoker    Packs/day: 1.00    Years: 21.00    Types: Cigarettes  . Smokeless tobacco: Never Used     Comment: one  pack daily  . Alcohol use No    Allergies  Allergen Reactions  . Bactrim [Sulfamethoxazole-Trimethoprim] Nausea And Vomiting  . Prednisone Other (See Comments)    "I was wide open and couldn't eat" per pt.     Current Outpatient Prescriptions  Medication Sig Dispense Refill  . acetaminophen (TYLENOL) 325 MG tablet Take 650 mg by mouth every 6 (six) hours as needed for mild pain.    Marland Kitchen aspirin 81 MG tablet Take 81 mg by mouth daily.    . blood glucose meter kit and supplies KIT Podigy Voice GLucometer - patient is visually impaired and needs meter that reads results.  Use to check BG up to QID.  Dx: E11.319 Z79.4 1 each 0  . blood glucose meter kit and supplies KIT Dispense based on patient and insurance preference. Use up to four times daily as directed. (FOR ICD-9 250.00, 250.01). 1 each 0  . Blood Glucose Monitoring Suppl (PRODIGY VOICE BLOOD GLUCOSE) w/Device KIT 1 Device by Does not apply route 2 (two) times daily. 1 kit 0  . carvedilol (COREG) 12.5 MG tablet Take 1 tablet (12.5 mg total) by mouth 2 (two) times daily. 180 tablet 1  . cholecalciferol (VITAMIN D) 1000 units tablet Take 1,000 Units by mouth every Monday, Wednesday, and Friday  with hemodialysis.     . citalopram (CELEXA) 20 MG tablet Take 1 tablet (20 mg total) by mouth daily. Reported on 08/12/2015 30 tablet 1  . fluticasone (FLONASE) 50 MCG/ACT nasal spray Place 2 sprays into both nostrils daily as needed for allergies. 16 g 11  . gabapentin (NEURONTIN) 300 MG capsule Take 300 mg by mouth 2 (two) times daily.    Marland Kitchen glucose blood test strip Use as instructed 100 each 12  . hydrALAZINE (APRESOLINE) 25 MG tablet Take 1 tablet (25 mg total) by mouth 3 (three) times daily. (Patient taking differently: Take 25 mg by mouth 2 (two) times daily. ) 90 tablet 1  . insulin lispro (HUMALOG KWIKPEN) 100 UNIT/ML KiwkPen INJECT 7-10 UNITS INTO THE SKIN THREE TIMES DAILY. (Patient taking differently: Inject 7-10 Units into the skin 3 (three)  times daily with meals. Per sliding scale) 45 mL 3  . sevelamer carbonate (RENVELA) 800 MG tablet Take 2,400 mg by mouth 3 (three) times daily with meals. And with snacks.    . pantoprazole (PROTONIX) 40 MG tablet Take 1 tablet (40 mg total) by mouth daily. 90 tablet 1  . polyethylene glycol-electrolytes (TRILYTE) 420 g solution Take 4,000 mLs by mouth as directed. (Patient not taking: Reported on 10/09/2016) 4000 mL 0   No current facility-administered medications for this visit.     REVIEW OF SYSTEMS:  _0  denotes positive finding, _1  denotes negative finding Cardiac  Comments:  Chest pain or chest pressure:    Shortness of breath upon exertion:    Short of breath when lying flat:    Irregular heart rhythm:        Vascular    Pain in calf, thigh, or hip brought on by ambulation:    Pain in feet at night that wakes you up from your sleep:     Blood clot in your veins:    Leg swelling:           PHYSICAL EXAM: Vitals:   10/09/16 1122 10/09/16 1124  BP: (!) 161/84 (!) 166/85  Pulse: (!) 56   Resp: 20   Temp: (!) 96.9 F (36.1 C)   TempSrc: Oral   SpO2: 96%   Weight: 171 lb (77.6 kg)   Height: _2  (1.6 m)     GENERAL: The patient is a well-nourished female, in no acute distress. The vital signs are documented above. CARDIOVASCULAR: Palpable radial pulses bilaterally. She does have flow in her right upper arm AV fistula. There is no evidence of false aneurysm formation. There is a weak thrill. PULMONARY: There is good air exchange  MUSCULOSKELETAL: There are no major deformities or cyanosis. NEUROLOGIC: No focal weakness or paresthesias are detected. SKIN: There are no ulcers or rashes noted. PSYCHIATRIC: The patient has a normal affect.  DATA:  She did undergo duplex of her graft today. This did show flow throughout the AV graft. There was elevated velocities at the arterial and venous anastomosis.  MEDICAL ISSUES: Patent right Dominik Lauricella access graft that was placed one  year ago.  Now having some difficulty with flow probably related to arterial and/or venous anastomotic stenosis. Will be scheduled for outpatient shuntogram and possible intervention on a nondialysis day. Discussed this with the patient who understands    Rosetta Posner, MD [facility] Vascular and Vein Specialists of [facility] Tel [phone] Pager [phone]

## 2016-10-09 NOTE — Telephone Encounter (Signed)
Please tell the patient the Rx was sent to her pharmacy

## 2016-10-09 NOTE — Addendum Note (Signed)
Addended by: Gordy Levan, Bensen Chadderdon A on: 10/09/2016 12:48 PM   Modules accepted: Orders

## 2016-10-10 DIAGNOSIS — N186 End stage renal disease: Secondary | ICD-10-CM | POA: Diagnosis not present

## 2016-10-10 DIAGNOSIS — D631 Anemia in chronic kidney disease: Secondary | ICD-10-CM | POA: Diagnosis not present

## 2016-10-10 DIAGNOSIS — E162 Hypoglycemia, unspecified: Secondary | ICD-10-CM | POA: Diagnosis not present

## 2016-10-10 DIAGNOSIS — N250 Renal osteodystrophy: Secondary | ICD-10-CM | POA: Diagnosis not present

## 2016-10-10 DIAGNOSIS — D509 Iron deficiency anemia, unspecified: Secondary | ICD-10-CM | POA: Diagnosis not present

## 2016-10-10 DIAGNOSIS — Z992 Dependence on renal dialysis: Secondary | ICD-10-CM | POA: Diagnosis not present

## 2016-10-10 NOTE — Telephone Encounter (Signed)
LMOM that prescription was sent to the pharmacy and to call if she has any questions.

## 2016-10-12 DIAGNOSIS — N186 End stage renal disease: Secondary | ICD-10-CM | POA: Diagnosis not present

## 2016-10-12 DIAGNOSIS — E162 Hypoglycemia, unspecified: Secondary | ICD-10-CM | POA: Diagnosis not present

## 2016-10-12 DIAGNOSIS — Z992 Dependence on renal dialysis: Secondary | ICD-10-CM | POA: Diagnosis not present

## 2016-10-12 DIAGNOSIS — N250 Renal osteodystrophy: Secondary | ICD-10-CM | POA: Diagnosis not present

## 2016-10-12 DIAGNOSIS — D509 Iron deficiency anemia, unspecified: Secondary | ICD-10-CM | POA: Diagnosis not present

## 2016-10-12 DIAGNOSIS — D631 Anemia in chronic kidney disease: Secondary | ICD-10-CM | POA: Diagnosis not present

## 2016-10-15 ENCOUNTER — Encounter: Payer: Self-pay | Admitting: Family Medicine

## 2016-10-15 ENCOUNTER — Ambulatory Visit (INDEPENDENT_AMBULATORY_CARE_PROVIDER_SITE_OTHER): Payer: Medicare Other | Admitting: Family Medicine

## 2016-10-15 VITALS — BP 160/78 | HR 55 | Temp 96.7°F | Ht 68.0 in | Wt 173.8 lb

## 2016-10-15 DIAGNOSIS — H5711 Ocular pain, right eye: Secondary | ICD-10-CM

## 2016-10-15 DIAGNOSIS — D631 Anemia in chronic kidney disease: Secondary | ICD-10-CM | POA: Diagnosis not present

## 2016-10-15 DIAGNOSIS — N186 End stage renal disease: Secondary | ICD-10-CM | POA: Diagnosis not present

## 2016-10-15 DIAGNOSIS — R188 Other ascites: Secondary | ICD-10-CM | POA: Diagnosis not present

## 2016-10-15 DIAGNOSIS — E162 Hypoglycemia, unspecified: Secondary | ICD-10-CM | POA: Diagnosis not present

## 2016-10-15 DIAGNOSIS — N289 Disorder of kidney and ureter, unspecified: Secondary | ICD-10-CM | POA: Diagnosis not present

## 2016-10-15 DIAGNOSIS — N250 Renal osteodystrophy: Secondary | ICD-10-CM | POA: Diagnosis not present

## 2016-10-15 DIAGNOSIS — D509 Iron deficiency anemia, unspecified: Secondary | ICD-10-CM | POA: Diagnosis not present

## 2016-10-15 DIAGNOSIS — Z992 Dependence on renal dialysis: Secondary | ICD-10-CM | POA: Diagnosis not present

## 2016-10-15 NOTE — Patient Instructions (Signed)
Great to see you! Please Call Dr. Oneida Alar today to discus your abdominal pain.   Please Call  Dr. Zadie Rhine to discuss you r eye pain.   You will be call by urology for an appointment to discuss your very small lesion on the left kidney

## 2016-10-15 NOTE — Progress Notes (Signed)
   HPI  Patient presents today here for discussion about recent ultrasound, eye pain.  Patient explains that she had a recent ultrasound with GI that he had difficulty understanding. She was told she had fluid in the abdomen, her nurse at dialysis states that her PCP will need to remove this. Patient states that she has episodes of abdominal discomfort and a feeling that her abdomen is tense. Her ultrasound for [facility] from March 1, approximately 3 weeks ago, is reviewed with her which shows possible cirrhosis, moderate ascites, and an echogenic focus in the left kidney.  The pain is not persistent. She is tolerating food and fluids usual.  Pain in the right eye Patient explains that it started last night. She states that since she began having eye surgeries she's had a pain similar to this occasionally which usually resolves by itself. She states that she's been getting crusty objects from her medial eyelid routinely since beginning the have surgeries. She has not discussed this plan with her eye surgeon, Dr. Zadie Rhine. She has decreased vision at baseline, this has not changed   PMH: Smoking status noted ROS: Per HPI  Objective: BP (!) 160/78   Pulse (!) 55   Temp (!) 96.7 F (35.9 C) (Oral)   Ht 5' 8"  (1.727 m)   Wt 173 lb 12.8 oz (78.8 kg)   LMP 05/05/2015   BMI 26.43 kg/m  Gen: NAD, alert, cooperative with exam HEENT: NCAT, dilated pupils bilaterally, normal extraocular movements, no injection CV: RRR, good S1/S2, no murmur Resp: CTABL, no wheezes, non-labored Abd: Soft, unable to appreciate fluid wave, nontender, positive bowel sounds Ext: No edema, warm Neuro: Alert and oriented  Assessment and plan:  # Ascites Possible new cirrhosis diagnosis, her ultrasound results were reviewed in detail. Recent labs show mildly elevated bilirubin at 1.4, normal LFTs. I recommended follow-up soon with her GI doctor to discuss whether or not her moderate ascites warrants  drainage. Abdominal exam is reassuring today  # Kidney lesion Echogenic focus possible angiomyolipoma seen on ultrasound, discussed with the patient and this is very small and likely benign. She has end-stage renal disease on dialysis I recommended urology follow-up to discuss the significance of this, also to discuss appropriate monitoring.  # Pain of the right eye With complex medical history Recommended calling her ophthalmologist to discuss this pain, she has had several eye surgeries and is legally blind. There are no red flags on her eye exam     Orders Placed This Encounter  Procedures  . Ambulatory referral to Urology    Referral Priority:   Routine    Referral Type:   Consultation    Referral Reason:   Specialty Services Required    Requested Specialty:   Urology    Number of Visits Requested:   [place], MD [place] 10/15/2016, 10:04 AM

## 2016-10-16 NOTE — Progress Notes (Signed)
I called and spoke to [facility] at Dr. Benay Spice. She had Juanda Crumble, [place] to call and I discussed with him Dr. Oneida Alar' recommendations. They were going to see pt this week on Thurs, but pt has to be at [facility] for something else early that morning. Her appt is on 10/23/2016 at 10:00 AM and pt is aware now.

## 2016-10-16 NOTE — Progress Notes (Signed)
PT is aware. She said she saw PCP, Dr. Wendi Snipes, and he said she has moderate amount of fluid in her abdomen. Said she needs to come in and see Dr. Oneida Alar and discuss. Pt can only come in on a Thursday. Please advise!

## 2016-10-17 ENCOUNTER — Other Ambulatory Visit: Payer: Self-pay

## 2016-10-18 DIAGNOSIS — E162 Hypoglycemia, unspecified: Secondary | ICD-10-CM | POA: Diagnosis not present

## 2016-10-18 DIAGNOSIS — D509 Iron deficiency anemia, unspecified: Secondary | ICD-10-CM | POA: Diagnosis not present

## 2016-10-18 DIAGNOSIS — N250 Renal osteodystrophy: Secondary | ICD-10-CM | POA: Diagnosis not present

## 2016-10-18 DIAGNOSIS — Z992 Dependence on renal dialysis: Secondary | ICD-10-CM | POA: Diagnosis not present

## 2016-10-18 DIAGNOSIS — N186 End stage renal disease: Secondary | ICD-10-CM | POA: Diagnosis not present

## 2016-10-18 DIAGNOSIS — D631 Anemia in chronic kidney disease: Secondary | ICD-10-CM | POA: Diagnosis not present

## 2016-10-18 NOTE — Progress Notes (Deleted)
Psychiatric Initial Adult Assessment   Patient Identification: Kara Knapp MRN:  [id number] Date of Evaluation:  10/18/2016 Referral Source: Timmothy Euler, MD Chief Complaint:   Visit Diagnosis: No diagnosis found.  History of Present Illness:   Kara Knapp is a 46 year old female with anxiety, diabetes, ascites with possible liver cirrhosis, who is referred with concerned for bipolar disorder.  Associated Signs/Symptoms: Depression Symptoms:  {DEPRESSION SYMPTOMS:20000} (Hypo) Manic Symptoms:  {BHH MANIC SYMPTOMS:22872} Anxiety Symptoms:  {BHH ANXIETY SYMPTOMS:22873} Psychotic Symptoms:  {BHH PSYCHOTIC SYMPTOMS:22874} PTSD Symptoms: {BHH PTSD SYMPTOMS:22875}  Past Psychiatric History: ***  Previous Psychotropic Medications: {YES/NO:21197}  Substance Abuse History in the last 12 months:  {yes no:314532}  Consequences of Substance Abuse: {BHH CONSEQUENCES OF SUBSTANCE ABUSE:22880}  Past Medical History:  Past Medical History:  Diagnosis Date  . Anemia of chronic disease   . Asthma   . Blind left eye   . Bronchitis   . Cataract   . Cholecystitis, acute 05/26/2013   Status post cholecystectomy  . Depression   . Diabetic foot ulcer ([place]) 03/01/2015  . Dialysis patient [facility])    M-W-F; [facility] in [place]  . ESRD on hemodialysis ([place])    Started diaylsis 12/29/15, T, TH, Sat  . Essential hypertension   . Fibroids   . Glaucoma   . History of blood transfusion   . History of pneumonia   . Hyperlipidemia   . Insulin-dependent diabetes mellitus with retinopathy ([place])   . Neuropathy ([place])   . Osteomyelitis ([place])    Toe on left foot    Past Surgical History:  Procedure Laterality Date  . AV FISTULA PLACEMENT Right 10/17/2015   Procedure: INSERTION OF ARTERIOVENOUS GORE-TEX GRAFT RIGHT UPPER ARM WITH ACUSEAL;  Surgeon: Conrad Hooker, MD;  Location: [place];  Service: Vascular;  Laterality: Right;  . CATARACT EXTRACTION W/ INTRAOCULAR LENS IMPLANT Bilateral   .  CESAREAN SECTION    . CHOLECYSTECTOMY N/A 05/25/2013   Procedure: LAPAROSCOPIC CHOLECYSTECTOMY;  Surgeon: Jamesetta So, MD;  [facility];  Service: General;  Laterality: N/A;  . COLONOSCOPY WITH PROPOFOL N/A 10/02/2016   Procedure: COLONOSCOPY WITH PROPOFOL;  Surgeon: Danie Binder, MD;  [facility];  Service: Endoscopy;  Laterality: N/A;  145 - pt knows to arrive at 11:15 per office  . ESOPHAGOGASTRODUODENOSCOPY (EGD) WITH PROPOFOL N/A 10/02/2016   Procedure: ESOPHAGOGASTRODUODENOSCOPY (EGD) WITH PROPOFOL;  Surgeon: Danie Binder, MD;  [facility];  Service: Endoscopy;  Laterality: N/A;  . EYE SURGERY    . PARS PLANA VITRECTOMY Left 11/24/2014   Procedure: PARS PLANA VITRECTOMY WITH 25 GAUGE;  Surgeon: Hurman Horn, MD;  Location: [place];  Service: Ophthalmology;  Laterality: Left;  . PERIPHERAL VASCULAR CATHETERIZATION N/A 04/28/2015   Procedure: Bilateral Upper Extremity Venography;  Surgeon: Conrad Alexis, MD;  Location: [place] [facility];  Service: Cardiovascular;  Laterality: N/A;  . PHOTOCOAGULATION WITH LASER Left 11/24/2014   Procedure: PHOTOCOAGULATION WITH LASER;  Surgeon: Hurman Horn, MD;  Location: [place];  Service: Ophthalmology;  Laterality: Left;  with insertion of silicone oil  . SAVORY DILATION N/A 10/02/2016   Procedure: SAVORY DILATION;  Surgeon: Danie Binder, MD;  [facility];  Service: Endoscopy;  Laterality: N/A;  . TUBAL LIGATION      Family Psychiatric History: ***  Family History:  Family History  Problem Relation Age of Onset  . COPD Mother   . Cancer Father   .  Lymphoma Father   . Diabetes Sister   . Deep vein thrombosis Sister   . Diabetes Brother   . Hyperlipidemia Brother   . Hypertension Brother   . Mental retardation Sister   . Colon cancer Neg Hx   . Liver disease Neg Hx     Social History:   Social History   Social History  . Marital status: Legally Separated    Spouse name: N/A  . Number of children:  N/A  . Years of education: N/A   Social History Main Topics  . Smoking status: Current Every Day Smoker    Packs/day: 1.00    Years: 21.00    Types: Cigarettes  . Smokeless tobacco: Never Used     Comment: one pack daily  . Alcohol use No  . Drug use: No  . Sexual activity: Yes    Birth control/ protection: Surgical   Other Topics Concern  . Not on file   Social History Narrative  . No narrative on file    Additional Social History: ***  Allergies:   Allergies  Allergen Reactions  . Bactrim [Sulfamethoxazole-Trimethoprim] Nausea And Vomiting  . Prednisone Other (See Comments)    "I was wide open and couldn't eat" per pt.     Metabolic Disorder Labs: Lab Results  Component Value Date   HGBA1C 7.4 09/06/2015   MPG 243 01/21/2015   MPG 272 (H) 05/22/2013   No results found for: PROLACTIN Lab Results  Component Value Date   CHOL 178 06/10/2015   TRIG 94 06/10/2015   HDL 46 06/10/2015   CHOLHDL 3.9 06/10/2015   LDLCALC 113 (H) 06/10/2015     Current Medications: Current Outpatient Prescriptions  Medication Sig Dispense Refill  . acetaminophen (TYLENOL) 325 MG tablet Take 650 mg by mouth every 6 (six) hours as needed for mild pain.    Marland Kitchen aspirin 81 MG tablet Take 81 mg by mouth daily.    . blood glucose meter kit and supplies KIT Podigy Voice GLucometer - patient is visually impaired and needs meter that reads results.  Use to check BG up to QID.  Dx: E11.319 Z79.4 1 each 0  . blood glucose meter kit and supplies KIT Dispense based on patient and insurance preference. Use up to four times daily as directed. (FOR ICD-9 250.00, 250.01). 1 each 0  . Blood Glucose Monitoring Suppl (PRODIGY VOICE BLOOD GLUCOSE) w/Device KIT 1 Device by Does not apply route 2 (two) times daily. 1 kit 0  . carvedilol (COREG) 12.5 MG tablet Take 1 tablet (12.5 mg total) by mouth 2 (two) times daily. 180 tablet 1  . cholecalciferol (VITAMIN D) 1000 units tablet Take 1,000 Units by mouth  every Monday, Wednesday, and Friday with hemodialysis.     . citalopram (CELEXA) 20 MG tablet Take 1 tablet (20 mg total) by mouth daily. Reported on 08/12/2015 30 tablet 1  . fluticasone (FLONASE) 50 MCG/ACT nasal spray Place 2 sprays into both nostrils daily as needed for allergies. 16 g 11  . gabapentin (NEURONTIN) 300 MG capsule Take 300 mg by mouth 2 (two) times daily.    Marland Kitchen glucose blood test strip Use as instructed 100 each 12  . hydrALAZINE (APRESOLINE) 25 MG tablet Take 1 tablet (25 mg total) by mouth 3 (three) times daily. (Patient taking differently: Take 25 mg by mouth 2 (two) times daily. ) 90 tablet 1  . insulin lispro (HUMALOG KWIKPEN) 100 UNIT/ML KiwkPen INJECT 7-10 UNITS INTO THE SKIN THREE TIMES  DAILY. (Patient taking differently: Inject 7-10 Units into the skin 3 (three) times daily with meals. Per sliding scale) 45 mL 3  . pantoprazole (PROTONIX) 40 MG tablet Take 1 tablet (40 mg total) by mouth daily. 90 tablet 1  . polyethylene glycol-electrolytes (TRILYTE) 420 g solution Take 4,000 mLs by mouth as directed. 4000 mL 0  . sevelamer carbonate (RENVELA) 800 MG tablet Take 2,400 mg by mouth 3 (three) times daily with meals. And with snacks.     No current facility-administered medications for this visit.     Neurologic: Headache: No Seizure: No Paresthesias:No  Musculoskeletal: Strength & Muscle Tone: within normal limits Gait & Station: normal Patient leans: N/A  Psychiatric Specialty Exam: ROS  Last menstrual period 05/05/2015.There is no height or weight on file to calculate BMI.  General Appearance: Fairly Groomed  Eye Contact:  Good  Speech:  Clear and Coherent  Volume:  Normal  Mood:  {BHH MOOD:22306}  Affect:  {Affect (PAA):22687}  Thought Process:  Coherent and Goal Directed  Orientation:  Full (Time, Place, and Person)  Thought Content:  Logical  Suicidal Thoughts:  {ST/HT (PAA):22692}  Homicidal Thoughts:  {ST/HT (PAA):22692}  Memory:  Immediate;    Good Recent;   Good Remote;   Good  Judgement:  {Judgement (PAA):22694}  Insight:  {Insight (PAA):22695}  Psychomotor Activity:  Normal  Concentration:  Concentration: Good and Attention Span: Good  Recall:  Good  Fund of Knowledge:Good  Language: Good  Akathisia:  No  Handed:  Right  AIMS (if indicated):  N/A  Assets:  Communication Skills Desire for Improvement  ADL's:  Intact  Cognition: WNL  Sleep:  ***   Assessment  Plan  The patient demonstrates the following risk factors for suicide: Chronic risk factors for suicide include: {Chronic Risk Factors for OTRRNHA:57903833}. Acute risk factors for suicide include: {Acute Risk Factors for XOVANVB:16606004}. Protective factors for this patient include: {Protective Factors for Suicide HTXH:74142395}. Considering these factors, the overall suicide risk at this point appears to be {Desc; low/moderate/high:110033}. Patient {ACTION; IS/IS VUY:23343568} appropriate for outpatient follow up.   Treatment Plan Summary: {CHL AMB [facility] MD [place] SHUO:3729021115}   [name], MD 3/22/201812:37 PM

## 2016-10-19 ENCOUNTER — Ambulatory Visit (HOSPITAL_COMMUNITY): Payer: Self-pay | Admitting: Psychiatry

## 2016-10-19 ENCOUNTER — Other Ambulatory Visit: Payer: Self-pay

## 2016-10-19 DIAGNOSIS — N250 Renal osteodystrophy: Secondary | ICD-10-CM | POA: Diagnosis not present

## 2016-10-19 DIAGNOSIS — D631 Anemia in chronic kidney disease: Secondary | ICD-10-CM | POA: Diagnosis not present

## 2016-10-19 DIAGNOSIS — D509 Iron deficiency anemia, unspecified: Secondary | ICD-10-CM | POA: Diagnosis not present

## 2016-10-19 DIAGNOSIS — E162 Hypoglycemia, unspecified: Secondary | ICD-10-CM | POA: Diagnosis not present

## 2016-10-19 DIAGNOSIS — N186 End stage renal disease: Secondary | ICD-10-CM | POA: Diagnosis not present

## 2016-10-19 DIAGNOSIS — Z992 Dependence on renal dialysis: Secondary | ICD-10-CM | POA: Diagnosis not present

## 2016-10-23 DIAGNOSIS — D631 Anemia in chronic kidney disease: Secondary | ICD-10-CM | POA: Diagnosis not present

## 2016-10-23 DIAGNOSIS — N250 Renal osteodystrophy: Secondary | ICD-10-CM | POA: Diagnosis not present

## 2016-10-23 DIAGNOSIS — E162 Hypoglycemia, unspecified: Secondary | ICD-10-CM | POA: Diagnosis not present

## 2016-10-23 DIAGNOSIS — E119 Type 2 diabetes mellitus without complications: Secondary | ICD-10-CM | POA: Diagnosis not present

## 2016-10-23 DIAGNOSIS — Z79899 Other long term (current) drug therapy: Secondary | ICD-10-CM | POA: Diagnosis not present

## 2016-10-23 DIAGNOSIS — D509 Iron deficiency anemia, unspecified: Secondary | ICD-10-CM | POA: Diagnosis not present

## 2016-10-23 DIAGNOSIS — Z794 Long term (current) use of insulin: Secondary | ICD-10-CM | POA: Diagnosis not present

## 2016-10-23 DIAGNOSIS — Z992 Dependence on renal dialysis: Secondary | ICD-10-CM | POA: Diagnosis not present

## 2016-10-23 DIAGNOSIS — N186 End stage renal disease: Secondary | ICD-10-CM | POA: Diagnosis not present

## 2016-10-24 DIAGNOSIS — N250 Renal osteodystrophy: Secondary | ICD-10-CM | POA: Diagnosis not present

## 2016-10-24 DIAGNOSIS — E162 Hypoglycemia, unspecified: Secondary | ICD-10-CM | POA: Diagnosis not present

## 2016-10-24 DIAGNOSIS — D631 Anemia in chronic kidney disease: Secondary | ICD-10-CM | POA: Diagnosis not present

## 2016-10-24 DIAGNOSIS — N186 End stage renal disease: Secondary | ICD-10-CM | POA: Diagnosis not present

## 2016-10-24 DIAGNOSIS — D509 Iron deficiency anemia, unspecified: Secondary | ICD-10-CM | POA: Diagnosis not present

## 2016-10-24 DIAGNOSIS — Z992 Dependence on renal dialysis: Secondary | ICD-10-CM | POA: Diagnosis not present

## 2016-10-25 ENCOUNTER — Encounter (HOSPITAL_COMMUNITY)
Admission: RE | Disposition: A | Discharge status: Home / Self Care | Payer: Self-pay | Source: Ambulatory Visit | Attending: Vascular Surgery

## 2016-10-25 ENCOUNTER — Other Ambulatory Visit: Payer: Self-pay | Admitting: Family Medicine

## 2016-10-25 ENCOUNTER — Ambulatory Visit (HOSPITAL_COMMUNITY)
Admission: RE | Admit: 2016-10-25 | Discharge: 2016-10-25 | Disposition: A | Discharge status: Home / Self Care | Payer: Medicare Other | Source: Ambulatory Visit | Attending: Vascular Surgery | Admitting: Vascular Surgery

## 2016-10-25 DIAGNOSIS — Z7982 Long term (current) use of aspirin: Secondary | ICD-10-CM | POA: Diagnosis not present

## 2016-10-25 DIAGNOSIS — N186 End stage renal disease: Secondary | ICD-10-CM | POA: Diagnosis not present

## 2016-10-25 DIAGNOSIS — E11319 Type 2 diabetes mellitus with unspecified diabetic retinopathy without macular edema: Secondary | ICD-10-CM | POA: Diagnosis not present

## 2016-10-25 DIAGNOSIS — Z794 Long term (current) use of insulin: Secondary | ICD-10-CM | POA: Diagnosis not present

## 2016-10-25 DIAGNOSIS — Z7951 Long term (current) use of inhaled steroids: Secondary | ICD-10-CM | POA: Diagnosis not present

## 2016-10-25 DIAGNOSIS — Z992 Dependence on renal dialysis: Secondary | ICD-10-CM | POA: Diagnosis not present

## 2016-10-25 DIAGNOSIS — E1122 Type 2 diabetes mellitus with diabetic chronic kidney disease: Secondary | ICD-10-CM | POA: Insufficient documentation

## 2016-10-25 DIAGNOSIS — E1140 Type 2 diabetes mellitus with diabetic neuropathy, unspecified: Secondary | ICD-10-CM | POA: Insufficient documentation

## 2016-10-25 DIAGNOSIS — D631 Anemia in chronic kidney disease: Secondary | ICD-10-CM | POA: Diagnosis not present

## 2016-10-25 DIAGNOSIS — T82858A Stenosis of vascular prosthetic devices, implants and grafts, initial encounter: Secondary | ICD-10-CM | POA: Insufficient documentation

## 2016-10-25 DIAGNOSIS — I5032 Chronic diastolic (congestive) heart failure: Secondary | ICD-10-CM | POA: Diagnosis not present

## 2016-10-25 DIAGNOSIS — Y832 Surgical operation with anastomosis, bypass or graft as the cause of abnormal reaction of the patient, or of later complication, without mention of misadventure at the time of the procedure: Secondary | ICD-10-CM | POA: Insufficient documentation

## 2016-10-25 DIAGNOSIS — I132 Hypertensive heart and chronic kidney disease with heart failure and with stage 5 chronic kidney disease, or end stage renal disease: Secondary | ICD-10-CM | POA: Insufficient documentation

## 2016-10-25 DIAGNOSIS — T82898A Other specified complication of vascular prosthetic devices, implants and grafts, initial encounter: Secondary | ICD-10-CM | POA: Diagnosis not present

## 2016-10-25 HISTORY — PX: A/V SHUNTOGRAM: CATH118297

## 2016-10-25 LAB — POCT I-STAT, CHEM 8
BUN: 22 mg/dL — ABNORMAL HIGH (ref 6–20)
Calcium, Ion: 1.08 mmol/L — ABNORMAL LOW (ref 1.15–1.40)
Chloride: 97 mmol/L — ABNORMAL LOW (ref 101–111)
Creatinine, Ser: 3 mg/dL — ABNORMAL HIGH (ref 0.44–1.00)
Glucose, Bld: 148 mg/dL — ABNORMAL HIGH (ref 65–99)
HEMATOCRIT: 36 % (ref 36.0–46.0)
HEMOGLOBIN: 12.2 g/dL (ref 12.0–15.0)
POTASSIUM: 4.7 mmol/L (ref 3.5–5.1)
SODIUM: 135 mmol/L (ref 135–145)
TCO2: 32 mmol/L (ref 0–100)

## 2016-10-25 SURGERY — A/V SHUNTOGRAM

## 2016-10-25 MED ORDER — HEPARIN (PORCINE) IN NACL 2-0.9 UNIT/ML-% IJ SOLN
INTRAMUSCULAR | Status: AC
  Filled 2016-10-25: qty 1000

## 2016-10-25 MED ORDER — HEPARIN (PORCINE) IN NACL 2-0.9 UNIT/ML-% IJ SOLN
INTRAMUSCULAR | Status: DC | PRN
  Administered 2016-10-25: 1000 mL

## 2016-10-25 MED ORDER — SODIUM CHLORIDE 0.9% FLUSH: 3.0000 mL | INTRAVENOUS | Status: DC | PRN

## 2016-10-25 MED ORDER — LIDOCAINE HCL (PF) 1 % IJ SOLN
INTRAMUSCULAR | Status: AC
  Filled 2016-10-25: qty 30

## 2016-10-25 MED ORDER — IODIXANOL 320 MG/ML IV SOLN
INTRAVENOUS | Status: DC | PRN
  Administered 2016-10-25: 25 mL

## 2016-10-25 MED ORDER — LIDOCAINE HCL (PF) 1 % IJ SOLN
INTRAMUSCULAR | Status: DC | PRN
  Administered 2016-10-25: 2 mL via SUBCUTANEOUS

## 2016-10-25 SURGICAL SUPPLY — 14 items
BALLN ARMADA 5X20X80 (BALLOONS) ×2
BALLOON ARMADA 5X20X80 (BALLOONS) ×1 IMPLANT
COVER DOME SNAP 22 D (MISCELLANEOUS) ×2 IMPLANT
COVER PRB 48X5XTLSCP FOLD TPE (BAG) ×2 IMPLANT
COVER PROBE 5X48 (BAG) ×2
KIT ENCORE 26 ADVANTAGE (KITS) ×2 IMPLANT
KIT MICROINTRODUCER STIFF 5F (SHEATH) ×2 IMPLANT
PROTECTION STATION PRESSURIZED (MISCELLANEOUS) ×2
SHEATH PINNACLE R/O II 5F 6CM (SHEATH) ×2 IMPLANT
STATION PROTECTION PRESSURIZED (MISCELLANEOUS) ×1 IMPLANT
STOPCOCK MORSE 400PSI 3WAY (MISCELLANEOUS) ×2 IMPLANT
TRAY PV CATH (CUSTOM PROCEDURE TRAY) ×2 IMPLANT
TUBING CIL FLEX 10 FLL-RA (TUBING) ×2 IMPLANT
WIRE BENTSON .035X145CM (WIRE) ×2 IMPLANT

## 2016-10-25 NOTE — Discharge Instructions (Signed)
Fistulogram, Care After Refer to this sheet in the next few weeks. These instructions provide you with information on caring for yourself after your procedure. Your health care provider may also give you more specific instructions. Your treatment has been planned according to current medical practices, but problems sometimes occur. Call your health care provider if you have any problems or questions after your procedure. What can I expect after the procedure? After your procedure, it is typical to have the following:  A small amount of discomfort in the area where the catheters were placed.  A small amount of bruising around the fistula.  Sleepiness and fatigue. Follow these instructions at home:  Rest at home for the day following your procedure.  Take medicines only as directed by your health care provider.  Do not take baths, swim, or use a hot tub until your health care provider approves. You may shower 24 hours after the procedure or as directed by your health care provider.  There are many different ways to close and cover an incision, including stitches, skin glue, and adhesive strips. Follow your health care provider's instructions on:  Incision care.  Bandage (dressing) changes and removal.  Incision closure removal.  Monitor your dialysis fistula carefully. Contact a health care provider if:  You have drainage, redness, swelling, or pain at your catheter site.  You have a fever.  You have chills. Get help right away if:  You feel weak.  You have trouble balancing.  You have trouble moving your arms or legs.  You have problems with your speech or vision.  You can no longer feel a vibration or buzz when you put your fingers over your dialysis fistula.  The limb that was used for the procedure:  Swells.  Is painful.  Is cold.  Is discolored, such as blue or pale white. This information is not intended to replace advice given to you by your health care  provider. Make sure you discuss any questions you have with your health care provider. Document Released: 11/30/2013 Document Revised: 12/22/2015 Document Reviewed: 09/04/2013 Elsevier Interactive Patient Education  2017 [employer].

## 2016-10-25 NOTE — Op Note (Signed)
    Patient name: Kara Knapp MRN: [id number] DOB: June 10, 1971 Sex: female  10/25/2016 Pre-operative Diagnosis: esrd, malfunction of right arm avg Post-operative diagnosis:  Same Surgeon:  Eda Paschal. Donzetta Matters, MD Procedure Performed: 1.  US guided cannulation of right arm av graft 2.  Right arm shuntogram 3.  Balloon angioplasty of graft to venous anastomosis with 35m balloon   Indications:  46year old female with end-stage renal disease on dialysis Monday Wednesday Friday via right arm AV graft. She is having some slow flow with decreased thrill throughout the graft and is indicated for the above procedure.  Findings: There is a greater than 50% stenosis at the venous outflow and post balloon angioplasty there is approximately 10% residual stenosis with brisk runoff through the runoff vein and improved thrill in the graft.   Procedure:  The patient was identified in the holding area and taken to room 8.  The patient was then placed supine on the table and prepped and draped in the usual sterile fashion.  A time out was called.  Ultrasound was used to evaluate the right upper arm graft and this was cannulated with 21-gauge needle and micro-sheath placed. We performed right upper extremity shuntogram with the above findings. I then [facility] for 5 French sheath performed balloon angioplasty with 5 mm balloon of the venous anastomosis. Completion demonstrated significant improvement there was also improvement in the thrill. We then performed retrograde shuntogram demonstrating patent arterial anastomosis although there is possibly impingement from the anastomosis on arterial flow certainly nothing to treat endovascularly. With this the sheath and wire were removed for Monocryl placed at the access site. Patient tolerated procedure well without immediate complication.    Katarina Riebe C. CDonzetta Matters MD [facility] [place]upOffice: [phone]: [phone]

## 2016-10-25 NOTE — H&P (Signed)
   History and Physical Update  The patient was interviewed and re-examined.  The patient's previous History and Physical has been reviewed and is unchanged from Dr. Donnetta Hutching office visit.   Kara Drennen C. Donzetta Matters, MD [facility] [place] Office: [phone] Pager: [phone]  10/25/2016, 11:00 AM

## 2016-10-26 ENCOUNTER — Encounter (HOSPITAL_COMMUNITY): Payer: Self-pay | Admitting: Vascular Surgery

## 2016-10-26 DIAGNOSIS — E162 Hypoglycemia, unspecified: Secondary | ICD-10-CM | POA: Diagnosis not present

## 2016-10-26 DIAGNOSIS — D509 Iron deficiency anemia, unspecified: Secondary | ICD-10-CM | POA: Diagnosis not present

## 2016-10-26 DIAGNOSIS — Z992 Dependence on renal dialysis: Secondary | ICD-10-CM | POA: Diagnosis not present

## 2016-10-26 DIAGNOSIS — N250 Renal osteodystrophy: Secondary | ICD-10-CM | POA: Diagnosis not present

## 2016-10-26 DIAGNOSIS — N186 End stage renal disease: Secondary | ICD-10-CM | POA: Diagnosis not present

## 2016-10-26 DIAGNOSIS — D631 Anemia in chronic kidney disease: Secondary | ICD-10-CM | POA: Diagnosis not present

## 2016-10-27 DIAGNOSIS — Z992 Dependence on renal dialysis: Secondary | ICD-10-CM | POA: Diagnosis not present

## 2016-10-27 DIAGNOSIS — N186 End stage renal disease: Secondary | ICD-10-CM | POA: Diagnosis not present

## 2016-10-29 DIAGNOSIS — D631 Anemia in chronic kidney disease: Secondary | ICD-10-CM | POA: Diagnosis not present

## 2016-10-29 DIAGNOSIS — E162 Hypoglycemia, unspecified: Secondary | ICD-10-CM | POA: Diagnosis not present

## 2016-10-29 DIAGNOSIS — Z992 Dependence on renal dialysis: Secondary | ICD-10-CM | POA: Diagnosis not present

## 2016-10-29 DIAGNOSIS — Z794 Long term (current) use of insulin: Secondary | ICD-10-CM | POA: Diagnosis not present

## 2016-10-29 DIAGNOSIS — E11649 Type 2 diabetes mellitus with hypoglycemia without coma: Secondary | ICD-10-CM | POA: Diagnosis not present

## 2016-10-29 DIAGNOSIS — N186 End stage renal disease: Secondary | ICD-10-CM | POA: Diagnosis not present

## 2016-10-29 DIAGNOSIS — D509 Iron deficiency anemia, unspecified: Secondary | ICD-10-CM | POA: Diagnosis not present

## 2016-10-31 ENCOUNTER — Telehealth: Payer: Self-pay | Admitting: Gastroenterology

## 2016-10-31 DIAGNOSIS — N186 End stage renal disease: Secondary | ICD-10-CM | POA: Diagnosis not present

## 2016-10-31 DIAGNOSIS — E162 Hypoglycemia, unspecified: Secondary | ICD-10-CM | POA: Diagnosis not present

## 2016-10-31 DIAGNOSIS — D509 Iron deficiency anemia, unspecified: Secondary | ICD-10-CM | POA: Diagnosis not present

## 2016-10-31 DIAGNOSIS — Z992 Dependence on renal dialysis: Secondary | ICD-10-CM | POA: Diagnosis not present

## 2016-10-31 DIAGNOSIS — E11649 Type 2 diabetes mellitus with hypoglycemia without coma: Secondary | ICD-10-CM | POA: Diagnosis not present

## 2016-10-31 DIAGNOSIS — D631 Anemia in chronic kidney disease: Secondary | ICD-10-CM | POA: Diagnosis not present

## 2016-10-31 NOTE — Telephone Encounter (Signed)
We have never scheduled pt for a para.  Forwarding to LSL, she seen pt at last OV.

## 2016-10-31 NOTE — Telephone Encounter (Signed)
Tried to call pt, her son answered and said she was at dialysis. Asked son to have her call office back.

## 2016-10-31 NOTE — Telephone Encounter (Signed)
Reviewed records. Patient had u/s at [facility] with moderate ascites one month ago.   I recommend OV to reevaluate if she needs LVAP. Also she has had lots of questions and probably best for face to face encounter. May use an urgent.

## 2016-10-31 NOTE — Progress Notes (Signed)
Please NIC for complete abd u/s to be done 02/2017. DX left kidney lesion and cirrhosis.

## 2016-10-31 NOTE — Progress Notes (Signed)
ON RECALL  °

## 2016-10-31 NOTE — Telephone Encounter (Signed)
Pt called wanting to schedule a para to have fluid drawn off. She can only go on Tuesday or Thursday. She has dialysis today and said to call her back in the morning with the appt date and time. [phone]

## 2016-11-01 ENCOUNTER — Ambulatory Visit: Payer: Medicare Other | Admitting: Family Medicine

## 2016-11-01 NOTE — Telephone Encounter (Signed)
She is aware of appointment

## 2016-11-01 NOTE — Telephone Encounter (Signed)
Pt is coming in to the office on 11/08/16 @ 11:00 am

## 2016-11-02 DIAGNOSIS — E11649 Type 2 diabetes mellitus with hypoglycemia without coma: Secondary | ICD-10-CM | POA: Diagnosis not present

## 2016-11-02 DIAGNOSIS — Z992 Dependence on renal dialysis: Secondary | ICD-10-CM | POA: Diagnosis not present

## 2016-11-02 DIAGNOSIS — E162 Hypoglycemia, unspecified: Secondary | ICD-10-CM | POA: Diagnosis not present

## 2016-11-02 DIAGNOSIS — D631 Anemia in chronic kidney disease: Secondary | ICD-10-CM | POA: Diagnosis not present

## 2016-11-02 DIAGNOSIS — N186 End stage renal disease: Secondary | ICD-10-CM | POA: Diagnosis not present

## 2016-11-02 DIAGNOSIS — D509 Iron deficiency anemia, unspecified: Secondary | ICD-10-CM | POA: Diagnosis not present

## 2016-11-05 DIAGNOSIS — Z992 Dependence on renal dialysis: Secondary | ICD-10-CM | POA: Diagnosis not present

## 2016-11-05 DIAGNOSIS — E162 Hypoglycemia, unspecified: Secondary | ICD-10-CM | POA: Diagnosis not present

## 2016-11-05 DIAGNOSIS — N186 End stage renal disease: Secondary | ICD-10-CM | POA: Diagnosis not present

## 2016-11-05 DIAGNOSIS — D509 Iron deficiency anemia, unspecified: Secondary | ICD-10-CM | POA: Diagnosis not present

## 2016-11-05 DIAGNOSIS — E11649 Type 2 diabetes mellitus with hypoglycemia without coma: Secondary | ICD-10-CM | POA: Diagnosis not present

## 2016-11-05 DIAGNOSIS — D631 Anemia in chronic kidney disease: Secondary | ICD-10-CM | POA: Diagnosis not present

## 2016-11-06 ENCOUNTER — Encounter: Payer: Self-pay | Admitting: Family Medicine

## 2016-11-06 ENCOUNTER — Ambulatory Visit (INDEPENDENT_AMBULATORY_CARE_PROVIDER_SITE_OTHER): Payer: Medicare Other | Admitting: Family Medicine

## 2016-11-06 VITALS — BP 136/76 | HR 60 | Temp 97.0°F | Ht 63.0 in | Wt 164.4 lb

## 2016-11-06 DIAGNOSIS — L609 Nail disorder, unspecified: Secondary | ICD-10-CM

## 2016-11-06 DIAGNOSIS — L739 Follicular disorder, unspecified: Secondary | ICD-10-CM

## 2016-11-06 DIAGNOSIS — F39 Unspecified mood [affective] disorder: Secondary | ICD-10-CM | POA: Diagnosis not present

## 2016-11-06 DIAGNOSIS — L608 Other nail disorders: Secondary | ICD-10-CM

## 2016-11-06 MED ORDER — MUPIROCIN 2 % EX OINT: 1.0000 "application " | TOPICAL_OINTMENT | Freq: Two times a day (BID) | CUTANEOUS | 0 refills | Status: DC

## 2016-11-06 MED ORDER — TRIAMCINOLONE ACETONIDE 0.1 % EX CREA: 1.0000 "application " | TOPICAL_CREAM | Freq: Two times a day (BID) | CUTANEOUS | 0 refills | Status: DC

## 2016-11-06 NOTE — Patient Instructions (Signed)
Great to see you!  Try the ointment on your scalp lesion and toes.   Use the kenalog cream for itching bumps on the arm.

## 2016-11-06 NOTE — Progress Notes (Signed)
HPI  Patient presents today here with toenail problems, scalp lesion  Toenails Patient explains that for quite some time she has been ripping off her toenails instead of trimming them. In the past and they have not been so fragile. Recently she's had issues with removing the entire toenail when she is trying to remove a portion you would normally. She denies any discomfort in the toes, swelling, or drainage.  Scalp lesion Patient has had some small irritated lesions previously helped by dermatology intervention. She did have good improvement after that, they have largely resolved until the last few days. She's had 2 lesions start up, the one on the posterior occiput area is large and very tender. She is worried about infection. She denies fever, chills, sweats.  Requests refill of topical steroid for itchy bumps on the backs of her arms.  PMH: Smoking status noted ROS: Per HPI  Objective: BP 136/76   Pulse 60   Temp 97 F (36.1 C) (Oral)   Ht 5' 3"  (1.6 m)   Wt 164 lb 6.4 oz (74.6 kg)   LMP 05/05/2015   BMI 29.12 kg/m  Gen: NAD, alert, cooperative with exam HEENT: NCAT CV: RRR, good S1/S2, no murmur Resp: CTABL, no wheezes, non-labored Ext: No edema, warm Neuro: Alert and oriented  Skin 2 lesions on the scalp, on the posterior proximal that area she has a proximally 5 mm raised slightly erythematous lesion is slightly tender to the touch, there is heme crusting on top. Much smaller 2-3 mm in diameter skin lesion on the occipital parietal area on the left, no tenderness to palpation There is one palpable lymph node at the base of the left side of the occipital area  Heme crusting and toenails removed on second and fifth toe of the right foot  Depression screen [facility] 2/9 11/06/2016 10/15/2016 09/05/2016 08/09/2016 04/11/2016  Decreased Interest 2 2 1  0 0  Down, Depressed, Hopeless 2 2 1  0 0  PHQ - 2 Score 4 4 2  0 0  Altered sleeping 2 2 3  - -  Tired, decreased energy 2 2 2  -  -  Change in appetite 3 2 1  - -  Feeling bad or failure about yourself  2 2 3  - -  Trouble concentrating 2 2 3  - -  Moving slowly or fidgety/restless 0 0 0 - -  Suicidal thoughts 1 2 0 - -  PHQ-9 Score 16 16 14  - -  Difficult doing work/chores Somewhat difficult - - - -  Some recent data might be hidden     Assessment and plan:  # Folliculitis Folliculitis versus impetigo type of infection Considering HD and ESRD would like to avoid systemic antibiotics if possible, trial of mupirocin Low threshold for follow-up of not improving  # Nontraumatic avulsion of the toenail Discussed not taking the toenails off with patient Apply mupirocin for wound care No signs of infection  # Mood disorder Patient feels down, denies suicidal ideation Has had some thoughts of being better off dead, however denies any plans to hurt herself and contracts for safety Patient cites mostly not having activities to go to having semi-health problems as the most depressing issue.  Welcomed close follow up if she would like change therapy   Meds ordered this encounter  Medications  . triamcinolone cream (KENALOG) 0.1 %    Sig: Apply 1 application topically 2 (two) times daily.    Dispense:  30 g    Refill:  0  .  mupirocin ointment (BACTROBAN) 2 %    Sig: Apply 1 application topically 2 (two) times daily.    Dispense:  22 g    Refill:  [place], MD [facility] 11/06/2016, 2:22 PM

## 2016-11-07 DIAGNOSIS — D631 Anemia in chronic kidney disease: Secondary | ICD-10-CM | POA: Diagnosis not present

## 2016-11-07 DIAGNOSIS — E11649 Type 2 diabetes mellitus with hypoglycemia without coma: Secondary | ICD-10-CM | POA: Diagnosis not present

## 2016-11-07 DIAGNOSIS — N186 End stage renal disease: Secondary | ICD-10-CM | POA: Diagnosis not present

## 2016-11-07 DIAGNOSIS — D509 Iron deficiency anemia, unspecified: Secondary | ICD-10-CM | POA: Diagnosis not present

## 2016-11-07 DIAGNOSIS — Z992 Dependence on renal dialysis: Secondary | ICD-10-CM | POA: Diagnosis not present

## 2016-11-07 DIAGNOSIS — E162 Hypoglycemia, unspecified: Secondary | ICD-10-CM | POA: Diagnosis not present

## 2016-11-08 ENCOUNTER — Ambulatory Visit (INDEPENDENT_AMBULATORY_CARE_PROVIDER_SITE_OTHER): Payer: Medicare Other | Admitting: Gastroenterology

## 2016-11-08 ENCOUNTER — Telehealth: Payer: Self-pay

## 2016-11-08 ENCOUNTER — Encounter: Payer: Self-pay | Admitting: Gastroenterology

## 2016-11-08 VITALS — BP 159/74 | HR 60 | Temp 98.4°F | Ht 63.0 in | Wt 162.2 lb

## 2016-11-08 DIAGNOSIS — R1310 Dysphagia, unspecified: Secondary | ICD-10-CM | POA: Diagnosis not present

## 2016-11-08 DIAGNOSIS — R1319 Other dysphagia: Secondary | ICD-10-CM

## 2016-11-08 DIAGNOSIS — K760 Fatty (change of) liver, not elsewhere classified: Secondary | ICD-10-CM | POA: Insufficient documentation

## 2016-11-08 DIAGNOSIS — K5900 Constipation, unspecified: Secondary | ICD-10-CM | POA: Diagnosis not present

## 2016-11-08 MED ORDER — POLYETHYLENE GLYCOL 3350 17 GM/SCOOP PO POWD: 1.0000 | Freq: Every day | ORAL | 3 refills | Status: DC

## 2016-11-08 MED ORDER — POLYETHYLENE GLYCOL 3350 17 GM/SCOOP PO POWD: ORAL | 3 refills | Status: DC

## 2016-11-08 NOTE — Assessment & Plan Note (Signed)
Fatty liver on prior imaging, possible early cirrhosis with suspected portal venous collaterals seen on ultrasound. No splenomegaly noted. No obvious portal hypertension on recent upper endoscopy. Albumin has been good. She has recently had some ascites issues, documented on ultrasound but at least a day [place] no tense ascites it would not be safe to attempt abdominal paracentesis. She notes that they have starting "challenging her" at dialysis and she's been able to drop weight and has had improvement of her abdominal distention. On exam she has a palpable liver as well as likely palpable uterus. I reviewed previous pelvic ultrasounds and she had a significantly enlarged uterus felt to be due to fibroids. She has not seen GYN in over a couple of years and we will have her go back for follow-up. She will return to the office in 3 months to see Dr. Oneida Alar.

## 2016-11-08 NOTE — Telephone Encounter (Signed)
I spoke to [facility] at [employer], she said she has the one for daily prn.

## 2016-11-08 NOTE — Assessment & Plan Note (Signed)
Home-going solid food dysphagia, no obvious esophageal stricture on recent EGD. No indication that dilation was performed. Plan for barium pill esophagram. Encouraged taking PPI on a daily basis.

## 2016-11-08 NOTE — Progress Notes (Signed)
Primary Care Physician: Kenn File, MD  Primary Gastroenterologist:  Barney Drain, MD   Chief Complaint  Patient presents with  . Constipation  . Ascites    ?    HPI: Kara Knapp is a 46 y.o. female here for follow-up. She was seen in February 2018. She is a history of chronic diarrhea, bowel incontinence worsened since cholecystectomy 2014. Intermittent rectal bleeding, dysphagia, abnormal lfts, hepatomegaly. She had EGD and colonoscopy in March. She had a single 5 mm because all nodule at the GE junction which was benign, patchy moderate edema and inflammation, erosions of the gastric fundus, biopsies benign, mild duodenitis. Colonoscopy she had 5 sessile polyps removed, 2 simple adenomas, 3 hyperplastic polyps. Recommend colonoscopy in 5-10 years. Also had hemorrhoids. Multiple serologies and labs were obtained for abnormal LFTs specifically elevated alkaline phosphatase. Her mitochondrial antibody profile is negative, hepatitis B surface antigen negative, folate and B12 level normal. Her celiac screen was negative, ANA, smooth muscle antibody, immunoglobulins all unremarkable. Her ferritin was slightly elevated at 300. Abdominal ultrasound showed fatty liver,? Portal venous collaterals, subcentimeter lesion in the upper pole of the left kidney? Angiomyolipoma but nonspecific. Plans for repeat ultrasound in 6 months for? Early cirrhosis. She also had moderate ascites on that study.  Patient has had complaints of abdominal distention has called a couple of times about getting "tapped". She has had serum albumin. There is been question of possible ascites related to her renal disease. Patient states she discussed this with her nephrologist and she has been "challenged" more recently in dialysis. Abdominal distention has improved but not 100%.  Her highest weight was back in February at 176 pounds, she is 162 pounds today. She reports that her dry weight is 173 per dialysis.    Stopped imodium after her last visit. Having more constipation even off imodium. Stools are hard. Small stool. Little bit 1-2 times per day. Can't see her stools because of her poor vision. Cannot report whether there is any blood or melena. She states she continues to feel like food is sticking in her chest which creates coughing and sometimes vomiting. Denies odynophagia, heartburn.   She complains of her belly filling hard in the lower abdomen. She notes that she has an enlarged uterus due to fibroids and is overdue to see her GYN, Dr. Glo Herring.  Current Outpatient Prescriptions  Medication Sig Dispense Refill  . acetaminophen (TYLENOL) 325 MG tablet Take 650 mg by mouth every 6 (six) hours as needed for mild pain.    Marland Kitchen aspirin 81 MG tablet Take 81 mg by mouth daily.    . blood glucose meter kit and supplies KIT Podigy Voice GLucometer - patient is visually impaired and needs meter that reads results.  Use to check BG up to QID.  Dx: E11.319 Z79.4 1 each 0  . blood glucose meter kit and supplies KIT Dispense based on patient and insurance preference. Use up to four times daily as directed. (FOR ICD-9 250.00, 250.01). 1 each 0  . Blood Glucose Monitoring Suppl (PRODIGY VOICE BLOOD GLUCOSE) w/Device KIT 1 Device by Does not apply route 2 (two) times daily. 1 kit 0  . carvedilol (COREG) 12.5 MG tablet Take 1 tablet (12.5 mg total) by mouth 2 (two) times daily. 180 tablet 1  . cholecalciferol (VITAMIN D) 1000 units tablet Take 1,000 Units by mouth daily.     . citalopram (CELEXA) 20 MG tablet Take 1 tablet (20 mg total) by mouth daily.  Reported on 08/12/2015 30 tablet 1  . fluticasone (FLONASE) 50 MCG/ACT nasal spray Place 2 sprays into both nostrils daily as needed for allergies. 16 g 11  . gabapentin (NEURONTIN) 300 MG capsule Take 300 mg by mouth 2 (two) times daily.    Marland Kitchen glucose blood test strip Use as instructed 100 each 12  . hydrALAZINE (APRESOLINE) 25 MG tablet Take 1 tablet (25 mg total)  by mouth 3 (three) times daily. (Patient taking differently: Take 25 mg by mouth 2 (two) times daily. ) 90 tablet 1  . insulin lispro (HUMALOG KWIKPEN) 100 UNIT/ML KiwkPen INJECT 7-10 UNITS INTO THE SKIN THREE TIMES DAILY. (Patient taking differently: Inject 7-10 Units into the skin 3 (three) times daily with meals. Per sliding scale) 45 mL 3  . mupirocin ointment (BACTROBAN) 2 % Apply 1 application topically 2 (two) times daily. 22 g 0  . pantoprazole (PROTONIX) 40 MG tablet Take 1 tablet (40 mg total) by mouth daily. 90 tablet 1  . sevelamer carbonate (RENVELA) 800 MG tablet Take 2,400 mg by mouth 3 (three) times daily with meals. And with snacks.    . triamcinolone cream (KENALOG) 0.1 % Apply 1 application topically 2 (two) times daily. 30 g 0   No current facility-administered medications for this visit.     Allergies as of 11/08/2016 - Review Complete 11/08/2016  Allergen Reaction Noted  . Bactrim [sulfamethoxazole-trimethoprim] Nausea And Vomiting 05/21/2013  . Prednisone Other (See Comments) 07/09/2013    ROS:  General: Negative for anorexia, weight loss, fever, chills, fatigue, weakness. ENT: Negative for hoarseness,  nasal congestion. See hpi. CV: Negative for chest pain, angina, palpitations, dyspnea on exertion, peripheral edema.  Respiratory: Negative for dyspnea at rest, dyspnea on exertion, cough, sputum, wheezing.  GI: See history of present illness. GU:  Negative for dysuria, hematuria, urinary incontinence, urinary frequency, nocturnal urination.continue to make urine.  Endo: Negative for unusual weight change.    Physical Examination:   BP (!) 159/74   Pulse 60   Temp 98.4 F (36.9 C) (Axillary)   Ht 5' 3"  (1.6 m)   Wt 162 lb 3.2 oz (73.6 kg)   LMP 05/05/2015   BMI 28.73 kg/m   General:chronically ill-appearing, nad. Eyes: No icterus. Mouth: Oropharyngeal mucosa moist and pink , no lesions erythema or exudate. Lungs: Clear to auscultation bilaterally.   Heart: Regular rate and rhythm, no murmurs rubs or gallops.  Abdomen: Bowel sounds are normal, slight to moderate distention but abd is soft with exception of palpable mass ?uterus below umbilicus AND palpable liver. nontender, +rectus diastasis, no abdominal bruits or hernia , no rebound or guarding.   Extremities: trace to 1+ lower extremity edema. No clubbing or deformities. Neuro: Alert and oriented x 4   Skin: Warm and dry, no jaundice.   Psych: Alert and cooperative, normal mood and affect.  Labs:  See hpi. Imaging Studies: No results found.

## 2016-11-08 NOTE — Patient Instructions (Signed)
1. Xray of your esophagus at [facility] as scheduled.  2. Start miralax one capful daily as needed for constipation. Consider taking a dose every day you do not have a good bowel movement.  3. I will review your records and let you know if you need further imaging of your stomach.  4. Return to the office in three months to see Dr. Oneida Alar.

## 2016-11-08 NOTE — Assessment & Plan Note (Signed)
Previously issues with diarrhea but now with constipation even after stopping Imodium. Trial of low-dose MiraLAX half to one packet daily as needed. Prescription provided. Return to the office in 3 months to see Dr. Oneida Alar.

## 2016-11-08 NOTE — Telephone Encounter (Signed)
Please call Kara Knapp's. I think she is the one I sent TWO prescriptions. The first one said take the whole bottle and the second one (the correct one) said to take one dose daily as needed. We need to cancel the first one.

## 2016-11-08 NOTE — Progress Notes (Signed)
cc'ed to pcp °

## 2016-11-08 NOTE — Telephone Encounter (Signed)
Pt is requesting rx for miralax sent to [facility].

## 2016-11-10 DIAGNOSIS — E11649 Type 2 diabetes mellitus with hypoglycemia without coma: Secondary | ICD-10-CM | POA: Diagnosis not present

## 2016-11-10 DIAGNOSIS — E162 Hypoglycemia, unspecified: Secondary | ICD-10-CM | POA: Diagnosis not present

## 2016-11-10 DIAGNOSIS — Z992 Dependence on renal dialysis: Secondary | ICD-10-CM | POA: Diagnosis not present

## 2016-11-10 DIAGNOSIS — D509 Iron deficiency anemia, unspecified: Secondary | ICD-10-CM | POA: Diagnosis not present

## 2016-11-10 DIAGNOSIS — N186 End stage renal disease: Secondary | ICD-10-CM | POA: Diagnosis not present

## 2016-11-10 DIAGNOSIS — D631 Anemia in chronic kidney disease: Secondary | ICD-10-CM | POA: Diagnosis not present

## 2016-11-12 ENCOUNTER — Other Ambulatory Visit: Payer: Self-pay | Admitting: Family Medicine

## 2016-11-12 DIAGNOSIS — D509 Iron deficiency anemia, unspecified: Secondary | ICD-10-CM | POA: Diagnosis not present

## 2016-11-12 DIAGNOSIS — E162 Hypoglycemia, unspecified: Secondary | ICD-10-CM | POA: Diagnosis not present

## 2016-11-12 DIAGNOSIS — N186 End stage renal disease: Secondary | ICD-10-CM | POA: Diagnosis not present

## 2016-11-12 DIAGNOSIS — E11649 Type 2 diabetes mellitus with hypoglycemia without coma: Secondary | ICD-10-CM | POA: Diagnosis not present

## 2016-11-12 DIAGNOSIS — D631 Anemia in chronic kidney disease: Secondary | ICD-10-CM | POA: Diagnosis not present

## 2016-11-12 DIAGNOSIS — Z992 Dependence on renal dialysis: Secondary | ICD-10-CM | POA: Diagnosis not present

## 2016-11-12 NOTE — Progress Notes (Signed)
REVIEWED-NO ADDITIONAL RECOMMENDATIONS. 

## 2016-11-12 NOTE — Telephone Encounter (Signed)
Noted  

## 2016-11-13 DIAGNOSIS — E1140 Type 2 diabetes mellitus with diabetic neuropathy, unspecified: Secondary | ICD-10-CM | POA: Diagnosis not present

## 2016-11-13 DIAGNOSIS — B9562 Methicillin resistant Staphylococcus aureus infection as the cause of diseases classified elsewhere: Secondary | ICD-10-CM | POA: Diagnosis not present

## 2016-11-13 DIAGNOSIS — Z794 Long term (current) use of insulin: Secondary | ICD-10-CM | POA: Diagnosis not present

## 2016-11-13 DIAGNOSIS — N186 End stage renal disease: Secondary | ICD-10-CM | POA: Diagnosis not present

## 2016-11-13 DIAGNOSIS — E1122 Type 2 diabetes mellitus with diabetic chronic kidney disease: Secondary | ICD-10-CM | POA: Diagnosis not present

## 2016-11-13 DIAGNOSIS — I120 Hypertensive chronic kidney disease with stage 5 chronic kidney disease or end stage renal disease: Secondary | ICD-10-CM | POA: Diagnosis not present

## 2016-11-13 DIAGNOSIS — Z992 Dependence on renal dialysis: Secondary | ICD-10-CM | POA: Diagnosis not present

## 2016-11-13 DIAGNOSIS — L03811 Cellulitis of head [any part, except face]: Secondary | ICD-10-CM | POA: Diagnosis not present

## 2016-11-13 DIAGNOSIS — Z7982 Long term (current) use of aspirin: Secondary | ICD-10-CM | POA: Diagnosis not present

## 2016-11-13 DIAGNOSIS — F17200 Nicotine dependence, unspecified, uncomplicated: Secondary | ICD-10-CM | POA: Diagnosis not present

## 2016-11-13 DIAGNOSIS — Z79899 Other long term (current) drug therapy: Secondary | ICD-10-CM | POA: Diagnosis not present

## 2016-11-14 DIAGNOSIS — D509 Iron deficiency anemia, unspecified: Secondary | ICD-10-CM | POA: Diagnosis not present

## 2016-11-14 DIAGNOSIS — E162 Hypoglycemia, unspecified: Secondary | ICD-10-CM | POA: Diagnosis not present

## 2016-11-14 DIAGNOSIS — N186 End stage renal disease: Secondary | ICD-10-CM | POA: Diagnosis not present

## 2016-11-14 DIAGNOSIS — Z992 Dependence on renal dialysis: Secondary | ICD-10-CM | POA: Diagnosis not present

## 2016-11-14 DIAGNOSIS — E11649 Type 2 diabetes mellitus with hypoglycemia without coma: Secondary | ICD-10-CM | POA: Diagnosis not present

## 2016-11-14 DIAGNOSIS — D631 Anemia in chronic kidney disease: Secondary | ICD-10-CM | POA: Diagnosis not present

## 2016-11-16 DIAGNOSIS — D509 Iron deficiency anemia, unspecified: Secondary | ICD-10-CM | POA: Diagnosis not present

## 2016-11-16 DIAGNOSIS — D631 Anemia in chronic kidney disease: Secondary | ICD-10-CM | POA: Diagnosis not present

## 2016-11-16 DIAGNOSIS — E162 Hypoglycemia, unspecified: Secondary | ICD-10-CM | POA: Diagnosis not present

## 2016-11-16 DIAGNOSIS — E11649 Type 2 diabetes mellitus with hypoglycemia without coma: Secondary | ICD-10-CM | POA: Diagnosis not present

## 2016-11-16 DIAGNOSIS — Z992 Dependence on renal dialysis: Secondary | ICD-10-CM | POA: Diagnosis not present

## 2016-11-16 DIAGNOSIS — N186 End stage renal disease: Secondary | ICD-10-CM | POA: Diagnosis not present

## 2016-11-19 DIAGNOSIS — D631 Anemia in chronic kidney disease: Secondary | ICD-10-CM | POA: Diagnosis not present

## 2016-11-19 DIAGNOSIS — E162 Hypoglycemia, unspecified: Secondary | ICD-10-CM | POA: Diagnosis not present

## 2016-11-19 DIAGNOSIS — N186 End stage renal disease: Secondary | ICD-10-CM | POA: Diagnosis not present

## 2016-11-19 DIAGNOSIS — E11649 Type 2 diabetes mellitus with hypoglycemia without coma: Secondary | ICD-10-CM | POA: Diagnosis not present

## 2016-11-19 DIAGNOSIS — Z992 Dependence on renal dialysis: Secondary | ICD-10-CM | POA: Diagnosis not present

## 2016-11-19 DIAGNOSIS — D509 Iron deficiency anemia, unspecified: Secondary | ICD-10-CM | POA: Diagnosis not present

## 2016-11-20 ENCOUNTER — Encounter (HOSPITAL_COMMUNITY): Payer: Self-pay | Admitting: *Deleted

## 2016-11-20 ENCOUNTER — Emergency Department (HOSPITAL_COMMUNITY)
Admission: EM | Admit: 2016-11-20 | Discharge: 2016-11-20 | Disposition: A | Discharge status: Home / Self Care | Payer: Medicare Other | Attending: Emergency Medicine | Admitting: Emergency Medicine

## 2016-11-20 ENCOUNTER — Ambulatory Visit (HOSPITAL_COMMUNITY)
Admission: RE | Admit: 2016-11-20 | Discharge: 2016-11-20 | Disposition: A | Discharge status: Home / Self Care | Payer: Medicare Other | Source: Ambulatory Visit | Attending: Gastroenterology | Admitting: Gastroenterology

## 2016-11-20 DIAGNOSIS — Z79899 Other long term (current) drug therapy: Secondary | ICD-10-CM | POA: Diagnosis not present

## 2016-11-20 DIAGNOSIS — Z7982 Long term (current) use of aspirin: Secondary | ICD-10-CM | POA: Diagnosis not present

## 2016-11-20 DIAGNOSIS — F17210 Nicotine dependence, cigarettes, uncomplicated: Secondary | ICD-10-CM | POA: Insufficient documentation

## 2016-11-20 DIAGNOSIS — Z791 Long term (current) use of non-steroidal anti-inflammatories (NSAID): Secondary | ICD-10-CM | POA: Insufficient documentation

## 2016-11-20 DIAGNOSIS — I132 Hypertensive heart and chronic kidney disease with heart failure and with stage 5 chronic kidney disease, or end stage renal disease: Secondary | ICD-10-CM | POA: Insufficient documentation

## 2016-11-20 DIAGNOSIS — K224 Dyskinesia of esophagus: Secondary | ICD-10-CM | POA: Diagnosis not present

## 2016-11-20 DIAGNOSIS — J45909 Unspecified asthma, uncomplicated: Secondary | ICD-10-CM | POA: Diagnosis not present

## 2016-11-20 DIAGNOSIS — R1310 Dysphagia, unspecified: Secondary | ICD-10-CM | POA: Diagnosis present

## 2016-11-20 DIAGNOSIS — L02811 Cutaneous abscess of head [any part, except face]: Secondary | ICD-10-CM | POA: Diagnosis not present

## 2016-11-20 DIAGNOSIS — E1122 Type 2 diabetes mellitus with diabetic chronic kidney disease: Secondary | ICD-10-CM | POA: Insufficient documentation

## 2016-11-20 DIAGNOSIS — Z992 Dependence on renal dialysis: Secondary | ICD-10-CM | POA: Insufficient documentation

## 2016-11-20 DIAGNOSIS — I5032 Chronic diastolic (congestive) heart failure: Secondary | ICD-10-CM | POA: Insufficient documentation

## 2016-11-20 DIAGNOSIS — N186 End stage renal disease: Secondary | ICD-10-CM | POA: Insufficient documentation

## 2016-11-20 DIAGNOSIS — R1319 Other dysphagia: Secondary | ICD-10-CM

## 2016-11-20 MED ORDER — BACITRACIN ZINC 500 UNIT/GM EX OINT
TOPICAL_OINTMENT | CUTANEOUS | Status: AC
  Filled 2016-11-20: qty 0.9

## 2016-11-20 MED ORDER — DOXYCYCLINE HYCLATE 100 MG PO CAPS: 100.0000 mg | ORAL_CAPSULE | Freq: Two times a day (BID) | ORAL | 0 refills | Status: AC

## 2016-11-20 MED ORDER — LIDOCAINE-EPINEPHRINE (PF) 2 %-1:200000 IJ SOLN
10.0000 mL | Freq: Once | INTRAMUSCULAR | Status: AC
  Administered 2016-11-20: 10 mL
  Filled 2016-11-20: qty 20

## 2016-11-20 NOTE — ED Notes (Signed)
Pt made aware to return if symptoms worsen or if any life threatening symptoms occur.   

## 2016-11-20 NOTE — ED Triage Notes (Signed)
Pt has an abscess on her posterior head. This started 2 weeks ago. She was seen by here PCP and [facility]. She was started on antibiotics and has been taking them as prescribed. NAD noted.

## 2016-11-20 NOTE — Discharge Instructions (Signed)
Please read and follow all provided instructions.  Your diagnoses today include:  1. Abscess, scalp     Tests performed today include: Vital signs. See below for your results today.   Medications prescribed:   Take any prescribed medications only as directed. I extended the Doxycycline that you are taking. Take as prescribed   Home care instructions:  Follow any educational materials contained in this packet  Follow-up instructions: Return to the Emergency Department in 48 hours for a recheck if your symptoms are not significantly improved.  Please follow-up with your primary care provider in the next 1 week for further evaluation of your symptoms.   Return instructions:  Return to the Emergency Department if you have: Fever Worsening symptoms Worsening pain Worsening swelling Redness of the skin that moves away from the affected area, especially if it streaks away from the affected area  Any other emergent concerns  Additional Information: If you have recurrent abscesses, try both the following. Use a Qtip to apply an over-the-counter antibiotic to the inside of your nostrils, twice a day for 5 days. Wash your body with over-the-counter Hibaclens once a day for one week and then once every two weeks. This can reduce the amount of bacterial on your skin that causes boils and lead to fewer boils. If you continue to have multiple or recurrent boils, you should see a dermatologist (skin doctor).   Your vital signs today were: BP (!) 162/77 (BP Location: Left Arm)    Pulse 60    Temp 97.5 F (36.4 C) (Temporal)    Resp 18    Ht 5' 3"  (1.6 m)    Wt 71.7 kg    LMP 05/05/2015    SpO2 98%    BMI 27.99 kg/m  If your blood pressure (BP) was elevated above 135/85 this visit, please have this repeated by your doctor within one month. --------------

## 2016-11-20 NOTE — ED Provider Notes (Signed)
[facility] DEPT Provider Note   CSN: [id number] Arrival date & time: 11/20/16  6256     History   Chief Complaint Chief Complaint  Patient presents with  . Abscess    HPI Kara Knapp is a 46 y.o. female.  HPI  46 y.o. female presents to the Emergency Department today complaining of abscess to posterior scalp. Noted x 2 weeks ago. Seen by PCP and started on ABX. Pt taking as Rxed with minimal improvement. Pt was on Doxycycline with 4 days left. No fevers. No N/V. Noted squeezing abscess and producing purulence from wound. No redness around area. No other symptoms noted.   Past Medical History:  Diagnosis Date  . Anemia of chronic disease   . Asthma   . Blind left eye   . Bronchitis   . Cataract   . Cholecystitis, acute 05/26/2013   Status post cholecystectomy  . Depression   . Diabetic foot ulcer ([place]) 03/01/2015  . Dialysis patient [facility])    M-W-F; [facility] in [place]  . ESRD on hemodialysis ([place])    Started diaylsis 12/29/15, T, TH, Sat  . Essential hypertension   . Fibroids   . Glaucoma   . History of blood transfusion   . History of pneumonia   . Hyperlipidemia   . Insulin-dependent diabetes mellitus with retinopathy ([place])   . Neuropathy   . Osteomyelitis ([place])    Toe on left foot    Patient Active Problem List   Diagnosis Date Noted  . Constipation 11/08/2016  . Fatty liver 11/08/2016  . Diarrhea   . Rectal bleeding 09/07/2016  . Legally blind 09/06/2016  . Heme positive stool 05/04/2016  . Hepatomegaly 05/04/2016  . Abnormal LFTs 05/04/2016  . Chronic diarrhea 05/04/2016  . Esophageal dysphagia 05/04/2016  . Anemia 05/04/2016  . Mood disorder ([place]) 08/12/2015  . Fibroid, uterine 01/24/2015  . Iron deficiency anemia due to chronic blood loss 01/24/2015  . Menorrhagia with irregular cycle 01/24/2015  . Chronic diastolic CHF (congestive heart failure) ([place]) 01/21/2015  . Insulin-dependent diabetes mellitus with retinopathy ([place]) 01/21/2015  .  Diabetic neuropathy ([place]) 01/21/2015  . ESRD on dialysis ([place]) 01/21/2015  . Anemia of chronic disease 01/21/2015  . Tobacco abuse 01/21/2015  . Cholecystitis, acute 05/26/2013  . Essential hypertension, benign 05/25/2013    Past Surgical History:  Procedure Laterality Date  . A/V SHUNTOGRAM N/A 10/25/2016   Procedure: A/V Shuntogram - Right Arm;  Surgeon: Waynetta Sandy, MD;  Location: [facility];  Service: Cardiovascular;  Laterality: N/A;  . AV FISTULA PLACEMENT Right 10/17/2015   Procedure: INSERTION OF ARTERIOVENOUS GORE-TEX GRAFT RIGHT UPPER ARM WITH ACUSEAL;  Surgeon: Conrad [place], MD;  Location: [place] [facility];  Service: Vascular;  Laterality: Right;  . CATARACT EXTRACTION W/ INTRAOCULAR LENS IMPLANT Bilateral   . CESAREAN SECTION    . CHOLECYSTECTOMY N/A 05/25/2013   Procedure: LAPAROSCOPIC CHOLECYSTECTOMY;  Surgeon: Jamesetta So, MD;  Location: [facility];  Service: General;  Laterality: N/A;  . COLONOSCOPY WITH PROPOFOL N/A 10/02/2016   Procedure: COLONOSCOPY WITH PROPOFOL;  Surgeon: Danie Binder, MD;  Location: [facility];  Service: Endoscopy;  Laterality: N/A;  145 - pt knows to arrive at 11:15 per office  . ESOPHAGOGASTRODUODENOSCOPY (EGD) WITH PROPOFOL N/A 10/02/2016   Procedure: ESOPHAGOGASTRODUODENOSCOPY (EGD) WITH PROPOFOL;  Surgeon: Danie Binder, MD;  [facility];  Service: Endoscopy;  Laterality: N/A;  . EYE SURGERY    . PARS PLANA VITRECTOMY Left 11/24/2014  Procedure: PARS PLANA VITRECTOMY WITH 25 GAUGE;  Surgeon: Hurman Horn, MD;  Location: [facility];  Service: Ophthalmology;  Laterality: Left;  . PERIPHERAL VASCULAR CATHETERIZATION N/A 04/28/2015   Procedure: Bilateral Upper Extremity Venography;  Surgeon: Conrad Winner, MD;  Location: [facility];  Service: Cardiovascular;  Laterality: N/A;  . PHOTOCOAGULATION WITH LASER Left 11/24/2014   Procedure: PHOTOCOAGULATION WITH LASER;  Surgeon: Hurman Horn, MD;  Location: [facility];  Service:  Ophthalmology;  Laterality: Left;  with insertion of silicone oil  . SAVORY DILATION N/A 10/02/2016   Procedure: SAVORY DILATION;  Surgeon: Danie Binder, MD;  [facility];  Service: Endoscopy;  Laterality: N/A;  . TUBAL LIGATION      OB History    Gravida Para Term Preterm AB Living   5 5           SAB TAB Ectopic Multiple Live Births                   Home Medications    Prior to Admission medications   Medication Sig Start Date End Date Taking? Authorizing Provider  acetaminophen (TYLENOL) 325 MG tablet Take 650 mg by mouth every 6 (six) hours as needed for mild pain.    Historical Provider, MD  aspirin 81 MG tablet Take 81 mg by mouth daily.    Historical Provider, MD  blood glucose meter kit and supplies KIT Podigy Voice GLucometer - patient is visually impaired and needs meter that reads results.  Use to check BG up to QID.  Dx: E11.319 Z79.4 05/14/15   Cherre Robins, PharmD  blood glucose meter kit and supplies KIT Dispense based on patient and insurance preference. Use up to four times daily as directed. (FOR ICD-9 250.00, 250.01). 04/27/16   Wardell Honour, MD  Blood Glucose Monitoring Suppl (PRODIGY VOICE BLOOD GLUCOSE) w/Device KIT 1 Device by Does not apply route 2 (two) times daily. 08/31/16   Timmothy Euler, MD  carvedilol (COREG) 12.5 MG tablet Take 1 tablet (12.5 mg total) by mouth 2 (two) times daily. 06/13/16   Timmothy Euler, MD  cholecalciferol (VITAMIN D) 1000 units tablet Take 1,000 Units by mouth daily.     Historical Provider, MD  citalopram (CELEXA) 20 MG tablet TAKE ONE TABLET BY MOUTH DAILY. 11/12/16   Timmothy Euler, MD  fluticasone (FLONASE) 50 MCG/ACT nasal spray Place 2 sprays into both nostrils daily as needed for allergies. 09/05/16   Fransisca Kaufmann Dettinger, MD  gabapentin (NEURONTIN) 300 MG capsule Take 300 mg by mouth 2 (two) times daily.    Historical Provider, MD  glucose blood test strip Use as instructed 08/29/16   Timmothy Euler, MD    hydrALAZINE (APRESOLINE) 25 MG tablet Take 1 tablet (25 mg total) by mouth 3 (three) times daily. Patient taking differently: Take 25 mg by mouth 2 (two) times daily.  07/08/15   Fransisca Kaufmann Dettinger, MD  insulin lispro (HUMALOG KWIKPEN) 100 UNIT/ML KiwkPen INJECT 7-10 UNITS INTO THE SKIN THREE TIMES DAILY. Patient taking differently: Inject 7-10 Units into the skin 3 (three) times daily with meals. Per sliding scale 06/13/16   Timmothy Euler, MD  mupirocin ointment (BACTROBAN) 2 % Apply 1 application topically 2 (two) times daily. 11/06/16   Timmothy Euler, MD  pantoprazole (PROTONIX) 40 MG tablet Take 1 tablet (40 mg total) by mouth daily. 10/09/16   Carlis Stable, NP  polyethylene glycol powder (GLYCOLAX/MIRALAX) powder Take  17 grams daily as needed for constipation. 11/08/16   Mahala Menghini, PA-C  sevelamer carbonate (RENVELA) 800 MG tablet Take 2,400 mg by mouth 3 (three) times daily with meals. And with snacks.    Historical Provider, MD  triamcinolone cream (KENALOG) 0.1 % Apply 1 application topically 2 (two) times daily. 11/06/16   Timmothy Euler, MD    Family History Family History  Problem Relation Age of Onset  . COPD Mother   . Cancer Father   . Lymphoma Father   . Diabetes Sister   . Deep vein thrombosis Sister   . Diabetes Brother   . Hyperlipidemia Brother   . Hypertension Brother   . Mental retardation Sister   . Colon cancer Neg Hx   . Liver disease Neg Hx     Social History Social History  Substance Use Topics  . Smoking status: Current Every Day Smoker    Packs/day: 1.00    Years: 21.00    Types: Cigarettes  . Smokeless tobacco: Never Used     Comment: one pack daily  . Alcohol use No     Allergies   Bactrim [sulfamethoxazole-trimethoprim] and Prednisone   Review of Systems Review of Systems  Constitutional: Negative for fever.  Gastrointestinal: Negative for nausea and vomiting.  Skin: Positive for wound.   Physical Exam Updated Vital  Signs BP (!) 162/77 (BP Location: Left Arm)   Pulse 60   Temp 97.5 F (36.4 C) (Temporal)   Resp 18   Ht 5' 3"  (1.6 m)   Wt 71.7 kg   LMP 05/05/2015   SpO2 98%   BMI 27.99 kg/m   Physical Exam  Constitutional: She is oriented to person, place, and time. Vital signs are normal. She appears well-developed and well-nourished.  HENT:  Head: Normocephalic.  Right Ear: Hearing normal.  Left Ear: Hearing normal.  Eyes: Conjunctivae and EOM are normal. Pupils are equal, round, and reactive to light.  Cardiovascular: Normal rate and regular rhythm.   Pulmonary/Chest: Effort normal.  Neurological: She is alert and oriented to person, place, and time.  Skin: Skin is warm and dry.  2cm fluctuance abscess noted on posterior scalp. Purulence noted. No erythema. TTP  Psychiatric: She has a normal mood and affect. Her speech is normal and behavior is normal. Thought content normal.  Nursing note and vitals reviewed.  ED Treatments / Results  Labs (all labs ordered are listed, but only abnormal results are displayed) Labs Reviewed - No data to display  EKG  EKG Interpretation None       Radiology Dg Esophagus  Result Date: 11/20/2016 CLINICAL DATA:  Esophageal dysphagia, feels like a reading get stuck in her throat area for 6 months, choking with swallowing, no improvement in symptoms following EGD and esophageal dilatation, history diabetes mellitus, asthma, hypertension, end-stage renal disease on dialysis EXAM: ESOPHOGRAM / BARIUM SWALLOW / BARIUM TABLET STUDY TECHNIQUE: Combined double contrast and single contrast examination performed using effervescent crystals, thick barium liquid, and thin barium liquid. The patient was observed with fluoroscopy swallowing a 13 mm barium sulphate tablet. FLUOROSCOPY TIME:  Fluoroscopy Time:  2 minutes 12 seconds Radiation Exposure Index (if provided by the fluoroscopic device): 31.0 mGy Number of Acquired Spot Images: multiple fluoroscopic screen  captures COMPARISON:  None FINDINGS: Esophageal distention: Normal without mass or stricture Filling defects:  None 12.5 mm barium tablet: Transient delay gastroesophageal junction but passed into stomach without obstruction after swallows of water and barium Motility: Diffuse esophageal dysmotility  with prolonged retention of contrast within the esophagus with patient in an LPO position. Numerous secondary and scattered tertiary waves identified. Mucosa:  Smooth without irregularity or ulceration Hypopharynx/cervical esophagus: No laryngeal penetration, aspiration or residuals Hiatal hernia:  Absent GE reflux:  Not visualized during exam Other:  N/A IMPRESSION: Esophageal dysmotility. Otherwise negative exam. Electronically Signed   By: Lavonia Dana M.D.   On: 11/20/2016 09:43    Procedures .Marland KitchenIncision and Drainage Date/Time: 11/20/2016 11:04 AM Performed by: Shary Decamp Authorized by: Shary Decamp   Consent:    Consent obtained:  Verbal   Consent given by:  Patient   Alternatives discussed:  No treatment Location:    Type:  Abscess   Location:  Head   Head location:  Scalp Pre-procedure details:    Skin preparation:  Betadine Anesthesia (see MAR for exact dosages):    Anesthesia method:  Local infiltration   Local anesthetic:  Lidocaine 1% WITH epi Procedure type:    Complexity:  Simple Procedure details:    Incision types:  Stab incision   Scalpel blade:  11   Drainage:  Purulent   Drainage amount:  Moderate   Wound treatment:  Wound left open   Packing materials:  1/2 in gauze Post-procedure details:    Patient tolerance of procedure:  Tolerated well, no immediate complications   (including critical care time)  Medications Ordered in ED Medications  lidocaine-EPINEPHrine (XYLOCAINE W/EPI) 2 %-1:200000 (PF) injection 10 mL (10 mLs Infiltration Given by Other 11/20/16 1030)   Initial Impression / Assessment and Plan / ED Course  I have reviewed the triage vital signs and the  nursing notes.  Pertinent labs & imaging results that were available during my care of the patient were reviewed by me and considered in my medical decision making (see chart for details).  Final Clinical Impressions(s) / ED Diagnoses     {I have reviewed the relevant previous healthcare records.  {I obtained HPI from historian.   ED Course:  Assessment: Pt is a 46 y.o. female who presents to the Emergency Department with skin abscess amenable to incision and drainage. No signs of cellulitis surrounding skin. Given Rx Abx, Will d/c to home.  Disposition/Plan:  DC Home Additional Verbal discharge instructions given and discussed with patient.  Pt Instructed to f/u with PCP in the next week for evaluation and treatment of symptoms. Return precautions given Pt acknowledges and agrees with plan  Supervising Physician Margette Fast, MD  Final diagnoses:  Abscess, scalp    New Prescriptions New Prescriptions   No medications on file     Shary Decamp, PA-C 11/20/16 [place], MD 11/20/16 [phone]

## 2016-11-20 NOTE — ED Notes (Addendum)
Dressing applied to head. Kara Knapp gave verbal order to apply bacitracin.   Kara Knapp informed of bp 178/85, is okay with discharge at this time. Pt states she has not taken her bp medication, was told to take it when she got home.

## 2016-11-21 DIAGNOSIS — N186 End stage renal disease: Secondary | ICD-10-CM | POA: Diagnosis not present

## 2016-11-21 DIAGNOSIS — D509 Iron deficiency anemia, unspecified: Secondary | ICD-10-CM | POA: Diagnosis not present

## 2016-11-21 DIAGNOSIS — D631 Anemia in chronic kidney disease: Secondary | ICD-10-CM | POA: Diagnosis not present

## 2016-11-21 DIAGNOSIS — E162 Hypoglycemia, unspecified: Secondary | ICD-10-CM | POA: Diagnosis not present

## 2016-11-21 DIAGNOSIS — E11649 Type 2 diabetes mellitus with hypoglycemia without coma: Secondary | ICD-10-CM | POA: Diagnosis not present

## 2016-11-21 DIAGNOSIS — Z992 Dependence on renal dialysis: Secondary | ICD-10-CM | POA: Diagnosis not present

## 2016-11-21 NOTE — Progress Notes (Signed)
Discussed at last OV.

## 2016-11-22 ENCOUNTER — Telehealth: Payer: Self-pay | Admitting: Gastroenterology

## 2016-11-22 DIAGNOSIS — E113592 Type 2 diabetes mellitus with proliferative diabetic retinopathy without macular edema, left eye: Secondary | ICD-10-CM | POA: Diagnosis not present

## 2016-11-22 DIAGNOSIS — E113511 Type 2 diabetes mellitus with proliferative diabetic retinopathy with macular edema, right eye: Secondary | ICD-10-CM | POA: Diagnosis not present

## 2016-11-22 DIAGNOSIS — H4051X3 Glaucoma secondary to other eye disorders, right eye, severe stage: Secondary | ICD-10-CM | POA: Diagnosis not present

## 2016-11-22 NOTE — Telephone Encounter (Signed)
[phone]  Patient called and stated that slf was going to look at her uterus ultrasound and find out if she needed to have fluid taken off

## 2016-11-22 NOTE — Telephone Encounter (Signed)
LMOM for a return call.  

## 2016-11-24 DIAGNOSIS — E162 Hypoglycemia, unspecified: Secondary | ICD-10-CM | POA: Diagnosis not present

## 2016-11-24 DIAGNOSIS — E11649 Type 2 diabetes mellitus with hypoglycemia without coma: Secondary | ICD-10-CM | POA: Diagnosis not present

## 2016-11-24 DIAGNOSIS — D509 Iron deficiency anemia, unspecified: Secondary | ICD-10-CM | POA: Diagnosis not present

## 2016-11-24 DIAGNOSIS — N186 End stage renal disease: Secondary | ICD-10-CM | POA: Diagnosis not present

## 2016-11-24 DIAGNOSIS — D631 Anemia in chronic kidney disease: Secondary | ICD-10-CM | POA: Diagnosis not present

## 2016-11-24 DIAGNOSIS — Z992 Dependence on renal dialysis: Secondary | ICD-10-CM | POA: Diagnosis not present

## 2016-11-26 DIAGNOSIS — D631 Anemia in chronic kidney disease: Secondary | ICD-10-CM | POA: Diagnosis not present

## 2016-11-26 DIAGNOSIS — Z992 Dependence on renal dialysis: Secondary | ICD-10-CM | POA: Diagnosis not present

## 2016-11-26 DIAGNOSIS — E11649 Type 2 diabetes mellitus with hypoglycemia without coma: Secondary | ICD-10-CM | POA: Diagnosis not present

## 2016-11-26 DIAGNOSIS — N186 End stage renal disease: Secondary | ICD-10-CM | POA: Diagnosis not present

## 2016-11-26 DIAGNOSIS — D509 Iron deficiency anemia, unspecified: Secondary | ICD-10-CM | POA: Diagnosis not present

## 2016-11-26 DIAGNOSIS — E162 Hypoglycemia, unspecified: Secondary | ICD-10-CM | POA: Diagnosis not present

## 2016-11-26 NOTE — Progress Notes (Signed)
Noted  

## 2016-11-26 NOTE — Telephone Encounter (Signed)
Kara Knapp, I know you are supposed to call this pt tomorrow.  I'm not sure I understand this note, and pt has not returned my call. Please let me know if I am supposed to do anything.

## 2016-11-26 NOTE — Progress Notes (Signed)
No esophageal stricture. Some dysmotility.  I need to discuss this result and I reviewed her prior pelvic u/s reports. Patient requests I call her on T/Th due to dialysis or M/W/F. I will call her tomorrow.   Doris please help remind me!

## 2016-11-26 NOTE — Telephone Encounter (Signed)
I plan to call her tomorrow and go over everything. Just help remind me to.

## 2016-11-27 ENCOUNTER — Telehealth: Payer: Self-pay | Admitting: Family Medicine

## 2016-11-27 NOTE — Telephone Encounter (Signed)
Spoke with patient at length regarding her barium pill esophagram. Swallowing precautions provided. Use plenty of liquids when eating or taking her pills. Sure she is food thoroughly and takes her time. She'll call us if he ongoing issues.  Discussed palpable uterus on recent office visit. She history of uterine fibroids but has not seen her gynecologist in nearly 2 years. I encouraged her to call them 1 appointment. She complains of significant abdominal discomfort in the lower abdomen which likely is related to enlarged uterus. "Abdomen hard and uncomfortable". If she has any difficulty getting her appointment, she'll call let us know. Otherwise we will see her back in August as originally planned.

## 2016-11-27 NOTE — Progress Notes (Signed)
See telephone note.

## 2016-11-27 NOTE — Telephone Encounter (Signed)
Noted! Forwarding to Manuela Schwartz to make sure OV appt nic'd.

## 2016-11-28 DIAGNOSIS — D509 Iron deficiency anemia, unspecified: Secondary | ICD-10-CM | POA: Diagnosis not present

## 2016-11-28 DIAGNOSIS — D631 Anemia in chronic kidney disease: Secondary | ICD-10-CM | POA: Diagnosis not present

## 2016-11-28 DIAGNOSIS — Z992 Dependence on renal dialysis: Secondary | ICD-10-CM | POA: Diagnosis not present

## 2016-11-28 DIAGNOSIS — N186 End stage renal disease: Secondary | ICD-10-CM | POA: Diagnosis not present

## 2016-11-28 NOTE — Telephone Encounter (Signed)
Please review and advise.

## 2016-11-28 NOTE — Telephone Encounter (Signed)
Reminder in epic °

## 2016-11-29 ENCOUNTER — Ambulatory Visit: Payer: Medicare Other | Admitting: Family Medicine

## 2016-11-30 DIAGNOSIS — K7581 Nonalcoholic steatohepatitis (NASH): Secondary | ICD-10-CM | POA: Insufficient documentation

## 2016-11-30 DIAGNOSIS — D631 Anemia in chronic kidney disease: Secondary | ICD-10-CM | POA: Diagnosis not present

## 2016-11-30 DIAGNOSIS — Z992 Dependence on renal dialysis: Secondary | ICD-10-CM | POA: Diagnosis not present

## 2016-11-30 DIAGNOSIS — D509 Iron deficiency anemia, unspecified: Secondary | ICD-10-CM | POA: Diagnosis not present

## 2016-11-30 DIAGNOSIS — N186 End stage renal disease: Secondary | ICD-10-CM | POA: Diagnosis not present

## 2016-12-04 DIAGNOSIS — Z992 Dependence on renal dialysis: Secondary | ICD-10-CM | POA: Diagnosis not present

## 2016-12-04 DIAGNOSIS — N186 End stage renal disease: Secondary | ICD-10-CM | POA: Diagnosis not present

## 2016-12-04 DIAGNOSIS — D631 Anemia in chronic kidney disease: Secondary | ICD-10-CM | POA: Diagnosis not present

## 2016-12-04 DIAGNOSIS — D509 Iron deficiency anemia, unspecified: Secondary | ICD-10-CM | POA: Diagnosis not present

## 2016-12-05 DIAGNOSIS — D631 Anemia in chronic kidney disease: Secondary | ICD-10-CM | POA: Diagnosis not present

## 2016-12-05 DIAGNOSIS — Z992 Dependence on renal dialysis: Secondary | ICD-10-CM | POA: Diagnosis not present

## 2016-12-05 DIAGNOSIS — D509 Iron deficiency anemia, unspecified: Secondary | ICD-10-CM | POA: Diagnosis not present

## 2016-12-05 DIAGNOSIS — N186 End stage renal disease: Secondary | ICD-10-CM | POA: Diagnosis not present

## 2016-12-07 ENCOUNTER — Other Ambulatory Visit: Payer: Self-pay | Admitting: Pharmacist

## 2016-12-07 NOTE — Telephone Encounter (Signed)
I looked under [place] work queue for [employer] request but did not see anything under either active or inactive requests.  Called [facility] and was given phone number [phone] and web site to call to get PA [email or web address]  I called above number and tried to assess website.  I was unable to login or create a login for webstie. I called number provided and  I was told by someone at [facility] that this was not a number for physician PA.  They are a pharmacy processor and only deal with the pharmacy and how they process claims.   Called back to [facility] to get more information. Amy - pharmacy tech said she will call number above to see if she can get more information about processing claim and provide to our office.

## 2016-12-07 NOTE — Telephone Encounter (Signed)
[facility] says that [place] has faxed [place] something twice on this. I have not seen anything, has either of you?

## 2016-12-07 NOTE — Telephone Encounter (Signed)
Amy from [facility] called back.  Insurance needed last visual acuity for patient.  Gave her information over phone from last visit with [facility] and also faxed copy of office visit to Amy for documentation.  She is confident with this information that Mrs. Gaut's Prodigy Glucometer will be approved.

## 2016-12-08 DIAGNOSIS — D631 Anemia in chronic kidney disease: Secondary | ICD-10-CM | POA: Diagnosis not present

## 2016-12-08 DIAGNOSIS — Z992 Dependence on renal dialysis: Secondary | ICD-10-CM | POA: Diagnosis not present

## 2016-12-08 DIAGNOSIS — D509 Iron deficiency anemia, unspecified: Secondary | ICD-10-CM | POA: Diagnosis not present

## 2016-12-08 DIAGNOSIS — N186 End stage renal disease: Secondary | ICD-10-CM | POA: Diagnosis not present

## 2016-12-10 ENCOUNTER — Other Ambulatory Visit: Payer: Self-pay

## 2016-12-10 MED ORDER — GLUCOSE BLOOD VI STRP: ORAL_STRIP | 1 refills | Status: DC

## 2016-12-13 ENCOUNTER — Emergency Department (HOSPITAL_COMMUNITY): Payer: Medicare Other

## 2016-12-13 ENCOUNTER — Encounter (HOSPITAL_COMMUNITY): Payer: Self-pay | Admitting: Emergency Medicine

## 2016-12-13 ENCOUNTER — Emergency Department (HOSPITAL_COMMUNITY)
Admission: EM | Admit: 2016-12-13 | Discharge: 2016-12-13 | Disposition: A | Discharge status: Home / Self Care | Payer: Medicare Other | Attending: Emergency Medicine | Admitting: Emergency Medicine

## 2016-12-13 DIAGNOSIS — E1122 Type 2 diabetes mellitus with diabetic chronic kidney disease: Secondary | ICD-10-CM | POA: Diagnosis not present

## 2016-12-13 DIAGNOSIS — J45909 Unspecified asthma, uncomplicated: Secondary | ICD-10-CM | POA: Insufficient documentation

## 2016-12-13 DIAGNOSIS — R188 Other ascites: Secondary | ICD-10-CM

## 2016-12-13 DIAGNOSIS — Z794 Long term (current) use of insulin: Secondary | ICD-10-CM | POA: Diagnosis not present

## 2016-12-13 DIAGNOSIS — R197 Diarrhea, unspecified: Secondary | ICD-10-CM | POA: Diagnosis not present

## 2016-12-13 DIAGNOSIS — N186 End stage renal disease: Secondary | ICD-10-CM

## 2016-12-13 DIAGNOSIS — F17210 Nicotine dependence, cigarettes, uncomplicated: Secondary | ICD-10-CM | POA: Insufficient documentation

## 2016-12-13 DIAGNOSIS — Z7982 Long term (current) use of aspirin: Secondary | ICD-10-CM | POA: Insufficient documentation

## 2016-12-13 DIAGNOSIS — I132 Hypertensive heart and chronic kidney disease with heart failure and with stage 5 chronic kidney disease, or end stage renal disease: Secondary | ICD-10-CM | POA: Diagnosis not present

## 2016-12-13 DIAGNOSIS — Z992 Dependence on renal dialysis: Secondary | ICD-10-CM | POA: Diagnosis not present

## 2016-12-13 DIAGNOSIS — I5032 Chronic diastolic (congestive) heart failure: Secondary | ICD-10-CM | POA: Insufficient documentation

## 2016-12-13 DIAGNOSIS — R109 Unspecified abdominal pain: Secondary | ICD-10-CM | POA: Diagnosis not present

## 2016-12-13 DIAGNOSIS — I517 Cardiomegaly: Secondary | ICD-10-CM | POA: Diagnosis not present

## 2016-12-13 DIAGNOSIS — R1084 Generalized abdominal pain: Secondary | ICD-10-CM | POA: Diagnosis present

## 2016-12-13 DIAGNOSIS — I120 Hypertensive chronic kidney disease with stage 5 chronic kidney disease or end stage renal disease: Secondary | ICD-10-CM | POA: Diagnosis not present

## 2016-12-13 LAB — GASTROINTESTINAL PANEL BY PCR, STOOL (REPLACES STOOL CULTURE)
Adenovirus F40/41: NOT DETECTED
Astrovirus: NOT DETECTED
CAMPYLOBACTER SPECIES: NOT DETECTED
CRYPTOSPORIDIUM: NOT DETECTED
Cyclospora cayetanensis: NOT DETECTED
ENTEROAGGREGATIVE E COLI (EAEC): NOT DETECTED
ENTEROPATHOGENIC E COLI (EPEC): NOT DETECTED
ENTEROTOXIGENIC E COLI (ETEC): NOT DETECTED
Entamoeba histolytica: NOT DETECTED
GIARDIA LAMBLIA: NOT DETECTED
Norovirus GI/GII: NOT DETECTED
PLESIMONAS SHIGELLOIDES: NOT DETECTED
ROTAVIRUS A: NOT DETECTED
SALMONELLA SPECIES: NOT DETECTED
SHIGELLA/ENTEROINVASIVE E COLI (EIEC): NOT DETECTED
Sapovirus (I, II, IV, and V): NOT DETECTED
Shiga like toxin producing E coli (STEC): NOT DETECTED
Vibrio cholerae: NOT DETECTED
Vibrio species: NOT DETECTED
YERSINIA ENTEROCOLITICA: NOT DETECTED

## 2016-12-13 LAB — CBC WITH DIFFERENTIAL/PLATELET
BASOS PCT: 1 %
Basophils Absolute: 0.1 10*3/uL (ref 0.0–0.1)
Eosinophils Absolute: 0.1 10*3/uL (ref 0.0–0.7)
Eosinophils Relative: 1 %
HEMATOCRIT: 37 % (ref 36.0–46.0)
HEMOGLOBIN: 12.2 g/dL (ref 12.0–15.0)
LYMPHS PCT: 15 %
Lymphs Abs: 1.2 10*3/uL (ref 0.7–4.0)
MCH: 32.3 pg (ref 26.0–34.0)
MCHC: 33 g/dL (ref 30.0–36.0)
MCV: 97.9 fL (ref 78.0–100.0)
MONO ABS: 0.7 10*3/uL (ref 0.1–1.0)
MONOS PCT: 9 %
NEUTROS ABS: 5.8 10*3/uL (ref 1.7–7.7)
NEUTROS PCT: 74 %
Platelets: 247 10*3/uL (ref 150–400)
RBC: 3.78 MIL/uL — ABNORMAL LOW (ref 3.87–5.11)
RDW: 15.5 % (ref 11.5–15.5)
WBC: 7.8 10*3/uL (ref 4.0–10.5)

## 2016-12-13 LAB — COMPREHENSIVE METABOLIC PANEL
ALBUMIN: 3.6 g/dL (ref 3.5–5.0)
ALT: 17 U/L (ref 14–54)
ANION GAP: 11 (ref 5–15)
AST: 21 U/L (ref 15–41)
Alkaline Phosphatase: 368 U/L — ABNORMAL HIGH (ref 38–126)
BILIRUBIN TOTAL: 2.1 mg/dL — AB (ref 0.3–1.2)
BUN: 54 mg/dL — AB (ref 6–20)
CHLORIDE: 97 mmol/L — AB (ref 101–111)
CO2: 23 mmol/L (ref 22–32)
Calcium: 8.9 mg/dL (ref 8.9–10.3)
Creatinine, Ser: 6.64 mg/dL — ABNORMAL HIGH (ref 0.44–1.00)
GFR calc Af Amer: 8 mL/min — ABNORMAL LOW (ref >60)
GFR calc non Af Amer: 7 mL/min — ABNORMAL LOW (ref >60)
GLUCOSE: 108 mg/dL — AB (ref 65–99)
POTASSIUM: 5.7 mmol/L — AB (ref 3.5–5.1)
SODIUM: 131 mmol/L — AB (ref 135–145)
TOTAL PROTEIN: 7.4 g/dL (ref 6.5–8.1)

## 2016-12-13 LAB — C DIFFICILE QUICK SCREEN W PCR REFLEX
C DIFFICILE (CDIFF) INTERP: NOT DETECTED
C DIFFICILE (CDIFF) TOXIN: NEGATIVE
C Diff antigen: NEGATIVE

## 2016-12-13 LAB — POC OCCULT BLOOD, ED: FECAL OCCULT BLD: NEGATIVE

## 2016-12-13 LAB — LIPASE, BLOOD: Lipase: 21 U/L (ref 11–51)

## 2016-12-13 MED ORDER — IOPAMIDOL (ISOVUE-300) INJECTION 61%
INTRAVENOUS | Status: AC
  Filled 2016-12-13: qty 30

## 2016-12-13 NOTE — ED Triage Notes (Addendum)
Pt reports having diarrhea since Tuesday with abdominal cramping.  4 stools in past 24 hours.  Last dialysis on Saturday, pt missed Tuesday and did not go this morning.

## 2016-12-13 NOTE — Procedures (Signed)
Successful US guided paracentesis via RLQ.  2L collected.  JWatts MD

## 2016-12-13 NOTE — ED Provider Notes (Signed)
[facility] DEPT Provider Note   CSN: [id number] Arrival date & time: 12/13/16  0714     History   Chief Complaint Chief Complaint  Patient presents with  . Diarrhea    HPI Kara Knapp is a 46 y.o. female.  HPI  Pt was seen at [place]. Per pt, c/o gradual onset and persistence of multiple intermittent episodes of diarrhea that began 2 days ago.   Describes the stools as "loose." Has been associated with generalized abd "cramping." Pt has not been to HD since last Saturday (skipped today and Tuesday).  Denies CP/SOB, no back pain, no fevers, no black or blood in stools, no N/V.    Past Medical History:  Diagnosis Date  . Anemia of chronic disease   . Asthma   . Blind left eye   . Bronchitis   . Cataract   . Cholecystitis, acute 05/26/2013   Status post cholecystectomy  . Depression   . Diabetic foot ulcer ([place]) 03/01/2015  . Dialysis patient [facility])    M-W-F; [facility] in [place]  . ESRD on hemodialysis ([place])    Started diaylsis 12/29/15, T, TH, Sat  . Essential hypertension   . Fibroids   . Glaucoma   . History of blood transfusion   . History of pneumonia   . Hyperlipidemia   . Insulin-dependent diabetes mellitus with retinopathy ([place])   . Neuropathy   . Osteomyelitis ([place])    Toe on left foot    Patient Active Problem List   Diagnosis Date Noted  . Constipation 11/08/2016  . Fatty liver 11/08/2016  . Diarrhea   . Rectal bleeding 09/07/2016  . Legally blind 09/06/2016  . Heme positive stool 05/04/2016  . Hepatomegaly 05/04/2016  . Abnormal LFTs 05/04/2016  . Chronic diarrhea 05/04/2016  . Esophageal dysphagia 05/04/2016  . Anemia 05/04/2016  . Mood disorder ([place]) 08/12/2015  . Fibroid, uterine 01/24/2015  . Iron deficiency anemia due to chronic blood loss 01/24/2015  . Menorrhagia with irregular cycle 01/24/2015  . Chronic diastolic CHF (congestive heart failure) ([place]) 01/21/2015  . Insulin-dependent diabetes mellitus with retinopathy ([place]) 01/21/2015    . Diabetic neuropathy ([place]) 01/21/2015  . ESRD on dialysis ([place]) 01/21/2015  . Anemia of chronic disease 01/21/2015  . Tobacco abuse 01/21/2015  . Cholecystitis, acute 05/26/2013  . Essential hypertension, benign 05/25/2013    Past Surgical History:  Procedure Laterality Date  . A/V SHUNTOGRAM N/A 10/25/2016   Procedure: A/V Shuntogram - Right Arm;  Surgeon: Waynetta Sandy, MD;  Location: [facility];  Service: Cardiovascular;  Laterality: N/A;  . AV FISTULA PLACEMENT Right 10/17/2015   Procedure: INSERTION OF ARTERIOVENOUS GORE-TEX GRAFT RIGHT UPPER ARM WITH ACUSEAL;  Surgeon: Conrad Rehrersburg, MD;  Location: [facility];  Service: Vascular;  Laterality: Right;  . CATARACT EXTRACTION W/ INTRAOCULAR LENS IMPLANT Bilateral   . CESAREAN SECTION    . CHOLECYSTECTOMY N/A 05/25/2013   Procedure: LAPAROSCOPIC CHOLECYSTECTOMY;  Surgeon: Jamesetta So, MD;  Location: [facility];  Service: General;  Laterality: N/A;  . COLONOSCOPY WITH PROPOFOL N/A 10/02/2016   Procedure: COLONOSCOPY WITH PROPOFOL;  Surgeon: Danie Binder, MD;  Location: [facility];  Service: Endoscopy;  Laterality: N/A;  145 - pt knows to arrive at 11:15 per office  . ESOPHAGOGASTRODUODENOSCOPY (EGD) WITH PROPOFOL N/A 10/02/2016   Procedure: ESOPHAGOGASTRODUODENOSCOPY (EGD) WITH PROPOFOL;  Surgeon: Danie Binder, MD;  [facility];  Service: Endoscopy;  Laterality: N/A;  . EYE SURGERY    .  PARS PLANA VITRECTOMY Left 11/24/2014   Procedure: PARS PLANA VITRECTOMY WITH 25 GAUGE;  Surgeon: Hurman Horn, MD;  Location: [facility];  Service: Ophthalmology;  Laterality: Left;  . PERIPHERAL VASCULAR CATHETERIZATION N/A 04/28/2015   Procedure: Bilateral Upper Extremity Venography;  Surgeon: Conrad Jansen, MD;  Location: [facility];  Service: Cardiovascular;  Laterality: N/A;  . PHOTOCOAGULATION WITH LASER Left 11/24/2014   Procedure: PHOTOCOAGULATION WITH LASER;  Surgeon: Hurman Horn, MD;  Location: [facility];  Service:  Ophthalmology;  Laterality: Left;  with insertion of silicone oil  . SAVORY DILATION N/A 10/02/2016   Procedure: SAVORY DILATION;  Surgeon: Danie Binder, MD;  [facility];  Service: Endoscopy;  Laterality: N/A;  . TUBAL LIGATION      OB History    Gravida Para Term Preterm AB Living   5 5           SAB TAB Ectopic Multiple Live Births                   Home Medications    Prior to Admission medications   Medication Sig Start Date End Date Taking? Authorizing Provider  acetaminophen (TYLENOL) 325 MG tablet Take 650 mg by mouth every 6 (six) hours as needed for mild pain.    [provider]  aspirin 81 MG tablet Take 81 mg by mouth daily.    [provider]  blood glucose meter kit and supplies KIT Podigy Voice GLucometer - patient is visually impaired and needs meter that reads results.  Use to check BG up to QID.  Dx: E11.319 Z79.4 05/14/15   Cherre Robins, PharmD  blood glucose meter kit and supplies KIT Dispense based on patient and insurance preference. Use up to four times daily as directed. (FOR ICD-9 250.00, 250.01). 04/27/16   Wardell Honour, MD  Blood Glucose Monitoring Suppl (PRODIGY VOICE BLOOD GLUCOSE) w/Device KIT 1 Device by Does not apply route 2 (two) times daily. 08/31/16   Timmothy Euler, MD  carvedilol (COREG) 12.5 MG tablet Take 1 tablet (12.5 mg total) by mouth 2 (two) times daily. 06/13/16   Timmothy Euler, MD  cholecalciferol (VITAMIN D) 1000 units tablet Take 1,000 Units by mouth daily.     [provider]  citalopram (CELEXA) 20 MG tablet TAKE ONE TABLET BY MOUTH DAILY. 11/12/16   Timmothy Euler, MD  doxycycline (VIBRA-TABS) 100 MG tablet Take 1 tablet by mouth 2 (two) times daily. 11/13/16   [provider]  fluticasone (FLONASE) 50 MCG/ACT nasal spray Place 2 sprays into both nostrils daily as needed for allergies. 09/05/16   Dettinger, Fransisca Kaufmann, MD  gabapentin (NEURONTIN) 300 MG capsule Take 300 mg by  mouth 2 (two) times daily.    [provider]  glucose blood (PRODIGY NO CODING BLOOD GLUC) test strip Test twice daily 12/10/16   Timmothy Euler, MD  hydrALAZINE (APRESOLINE) 25 MG tablet Take 1 tablet (25 mg total) by mouth 3 (three) times daily. Patient taking differently: Take 25 mg by mouth 2 (two) times daily.  07/08/15   Dettinger, Fransisca Kaufmann, MD  insulin lispro (HUMALOG KWIKPEN) 100 UNIT/ML KiwkPen INJECT 7-10 UNITS INTO THE SKIN THREE TIMES DAILY. 06/13/16   Timmothy Euler, MD  mupirocin ointment (BACTROBAN) 2 % Apply 1 application topically 2 (two) times daily. 11/06/16   Timmothy Euler, MD  pantoprazole (PROTONIX) 40 MG tablet Take 1 tablet (40 mg total) by mouth daily.  10/09/16   Carlis Stable, NP  polyethylene glycol powder (GLYCOLAX/MIRALAX) powder Take 17 grams daily as needed for constipation. 11/08/16   Mahala Menghini, PA-C  PRODIGY TWIST TOP LANCETS 28G MISC USE TO CHECK BLOOD SUGAR UP TO FOUR TIMES DAILY. 12/10/16   Timmothy Euler, MD  sevelamer carbonate (RENVELA) 800 MG tablet Take 2,400 mg by mouth 3 (three) times daily with meals. And with snacks.    [provider]  triamcinolone cream (KENALOG) 0.1 % Apply 1 application topically 2 (two) times daily. 11/06/16   Timmothy Euler, MD    Family History Family History  Problem Relation Age of Onset  . COPD Mother   . Cancer Father   . Lymphoma Father   . Diabetes Sister   . Deep vein thrombosis Sister   . Diabetes Brother   . Hyperlipidemia Brother   . Hypertension Brother   . Mental retardation Sister   . Colon cancer Neg Hx   . Liver disease Neg Hx     Social History Social History  Substance Use Topics  . Smoking status: Current Every Day Smoker    Packs/day: 1.00    Years: 21.00    Types: Cigarettes  . Smokeless tobacco: Never Used     Comment: one pack daily  . Alcohol use No     Allergies   Bactrim [sulfamethoxazole-trimethoprim] and Prednisone   Review of  Systems Review of Systems ROS: Statement: All systems negative except as marked or noted in the HPI; Constitutional: Negative for fever and chills. ; ; Eyes: Negative for eye pain, redness and discharge. ; ; ENMT: Negative for ear pain, hoarseness, nasal congestion, sinus pressure and sore throat. ; ; Cardiovascular: Negative for chest pain, palpitations, diaphoresis, dyspnea and peripheral edema. ; ; Respiratory: Negative for cough, wheezing and stridor. ; ; Gastrointestinal: +diarrhea. Negative for nausea, vomiting, blood in stool, hematemesis, jaundice and rectal bleeding. . ; ; Genitourinary: Negative for dysuria, flank pain and hematuria. ; ; Musculoskeletal: Negative for back pain and neck pain. Negative for swelling and trauma.; ; Skin: Negative for pruritus, rash, abrasions, blisters, bruising and skin lesion.; ; Neuro: Negative for headache, lightheadedness and neck stiffness. Negative for weakness, altered level of consciousness, altered mental status, extremity weakness, paresthesias, involuntary movement, seizure and syncope.       Physical Exam Updated Vital Signs BP (!) 154/77 (BP Location: Left Arm)   Pulse (!) 54   Resp 18   Ht 5' 3"  (1.6 m)   Wt 155 lb (70.3 kg)   LMP 05/05/2015   SpO2 100%   BMI 27.46 kg/m   Physical Exam 0730; Physical examination:  Nursing notes reviewed; Vital signs and O2 SAT reviewed;  Constitutional: Well developed, Well nourished, Well hydrated, In no acute distress; Head:  Normocephalic, atraumatic; Eyes: EOMI, PERRL, No scleral icterus; ENMT: Mouth and pharynx normal, Mucous membranes moist; Neck: Supple, Full range of motion, No lymphadenopathy; Cardiovascular: Regular rate and rhythm, No gallop; Respiratory: Breath sounds clear & equal bilaterally, No wheezes.  Speaking full sentences with ease, Normal respiratory effort/excursion; Chest: Nontender, Movement normal; Abdomen: +mild generalized tenderness to palp with mild distention. Soft. Normal  bowel sounds; Genitourinary: No CVA tenderness; Extremities: Pulses normal, No tenderness, No edema, No calf edema or asymmetry.; Neuro: AA&Ox3, Legally blind, otherwise major CN grossly intact.  Speech clear. No gross focal motor or sensory deficits in extremities. Climbs on and off stretcher easily by herself. Gait steady.; Skin: Color normal, Warm, Dry.  ED Treatments / Results  Labs (all labs ordered are listed, but only abnormal results are displayed)   EKG  EKG Interpretation  Date/Time:  Thursday Dec 13 2016 12:15:06 EDT Ventricular Rate:  52 PR Interval:    QRS Duration: 94 QT Interval:  503 QTC Calculation: 468 R Axis:   116 Text Interpretation:  Sinus rhythm Probable lateral infarct, old When compared with ECG of 09/28/2016 No significant change was found Confirmed by [facility]  MD, Nunzio Cory [phone]) on 12/13/2016 12:33:31 PM       Radiology   Procedures Procedures (including critical care time)  Medications Ordered in ED Medications - No data to display   Initial Impression / Assessment and Plan / ED Course  I have reviewed the triage vital signs and the nursing notes.  Pertinent labs & imaging results that were available during my care of the patient were reviewed by me and considered in my medical decision making (see chart for details).  MDM Reviewed: nursing note, previous chart and vitals Reviewed previous: labs Interpretation: labs, x-ray and CT scan   Results for orders placed or performed during the hospital encounter of 12/13/16  C difficile quick scan w PCR reflex  Result Value Ref Range   C Diff antigen NEGATIVE NEGATIVE   C Diff toxin NEGATIVE NEGATIVE   C Diff interpretation No C. difficile detected.   Comprehensive metabolic panel  Result Value Ref Range   Sodium 131 (L) 135 - 145 mmol/L   Potassium 5.7 (H) 3.5 - 5.1 mmol/L   Chloride 97 (L) 101 - 111 mmol/L   CO2 23 22 - 32 mmol/L   Glucose, Bld 108 (H) 65 - 99 mg/dL   BUN 54 (H) 6 - 20  mg/dL   Creatinine, Ser 6.64 (H) 0.44 - 1.00 mg/dL   Calcium 8.9 8.9 - 10.3 mg/dL   Total Protein 7.4 6.5 - 8.1 g/dL   Albumin 3.6 3.5 - 5.0 g/dL   AST 21 15 - 41 U/L   ALT 17 14 - 54 U/L   Alkaline Phosphatase 368 (H) 38 - 126 U/L   Total Bilirubin 2.1 (H) 0.3 - 1.2 mg/dL   GFR calc non Af Amer 7 (L) >60 mL/min   GFR calc Af Amer 8 (L) >60 mL/min   Anion gap 11 5 - 15  Lipase, blood  Result Value Ref Range   Lipase 21 11 - 51 U/L  CBC with Differential  Result Value Ref Range   WBC 7.8 4.0 - 10.5 K/uL   RBC 3.78 (L) 3.87 - 5.11 MIL/uL   Hemoglobin 12.2 12.0 - 15.0 g/dL   HCT 37.0 36.0 - 46.0 %   MCV 97.9 78.0 - 100.0 fL   MCH 32.3 26.0 - 34.0 pg   MCHC 33.0 30.0 - 36.0 g/dL   RDW 15.5 11.5 - 15.5 %   Platelets 247 150 - 400 K/uL   Neutrophils Relative % 74 %   Neutro Abs 5.8 1.7 - 7.7 K/uL   Lymphocytes Relative 15 %   Lymphs Abs 1.2 0.7 - 4.0 K/uL   Monocytes Relative 9 %   Monocytes Absolute 0.7 0.1 - 1.0 K/uL   Eosinophils Relative 1 %   Eosinophils Absolute 0.1 0.0 - 0.7 K/uL   Basophils Relative 1 %   Basophils Absolute 0.1 0.0 - 0.1 K/uL  POC occult blood, ED  Result Value Ref Range   Fecal Occult Bld NEGATIVE NEGATIVE   Ct Abdomen Pelvis Wo Contrast Result Date: 12/13/2016  CLINICAL DATA:  Diarrhea and abdominal cramping over the last 2 days. EXAM: CT ABDOMEN AND PELVIS WITHOUT CONTRAST TECHNIQUE: Multidetector CT imaging of the abdomen and pelvis was performed following the standard protocol without IV contrast. COMPARISON:  12/07/2014 FINDINGS: Lower chest: Lungs are clear except for mild scarring. No pleural or pericardial fluid. The heart is enlarged. There is coronary artery calcification. Hepatobiliary: There is hepatomegaly. Previous cholecystectomy. No focal liver parenchymal lesion is identified. Pancreas: Normal Spleen: Normal Adrenals/Urinary Tract: Adrenal glands are normal. The kidneys are small. There are tiny stones in the lower pole on the right versus  vascular calcification. No mass or hydronephrosis. Stomach/Bowel: No primary bowel pathology is identified. No CT evidence of enteritis or obstruction. Vascular/Lymphatic: Aortic atherosclerosis. No aneurysm. IVC is normal. No retroperitoneal adenopathy. Reproductive: Multiple uterine leiomyoma is. No evidence of adnexal mass. Other: Large amount of ascites, freely distributed. Specific etiology indeterminate. I do not see any definable peritoneal mass. Edema of the body wall tissues diffusely. Musculoskeletal: Negative IMPRESSION: Large amount of ascites, freely distributed. Etiology not clearly established. The patient does appear to have a degree of hepatomegaly, but the spleen is within normal limits in size. Portal and hepatic veins can't be evaluated without contrast. No sign of peritoneal mass. Body wall edema/ anasarca present. Small kidneys without focal or acute finding. No discernible bowel pathology. Multiple leiomyomas of the uterus as seen previously. Electronically Signed   By: Nelson Chimes M.D.   On: 12/13/2016 10:43   Dg Chest 2 View Result Date: 12/13/2016 CLINICAL DATA:  Diarrhea and abdominal cramps EXAM: CHEST  2 VIEW COMPARISON:  08/21/2016 FINDINGS: Borderline cardiomegaly that is stable. Prominence along the main pulmonary artery contour, stable. Mild vascular appearing fullness of the right hilum. There is no edema, consolidation, effusion, or pneumothorax. No acute osseous finding. IMPRESSION: 1. No acute finding. 2. Prominent main pulmonary artery contour, consider pulmonary hypertension. Electronically Signed   By: Monte Fantasia M.D.   On: 12/13/2016 08:41   US Paracentesis Result Date: 12/13/2016 INDICATION: Ascites of unknown cause. EXAM: ULTRASOUND GUIDED DIAGNOSTIC PARACENTESIS MEDICATIONS: None. COMPLICATIONS: None immediate. PROCEDURE: Informed written consent was obtained from the patient after a discussion of the risks, benefits and alternatives to treatment. A timeout was  performed prior to the initiation of the procedure. Initial ultrasound scanning demonstrates an adequate amount of ascites within the right lower abdominal quadrant. The right lower abdomen was prepped and draped in the usual sterile fashion. 1% lidocaine was used for local anesthesia. Following this, a 19 gauge, 7-cm, Yueh catheter was introduced. An ultrasound image was saved for documentation purposes. The paracentesis was performed. The catheter was removed and a dressing was applied. The patient tolerated the procedure well without immediate post procedural complication. FINDINGS: A total of approximately 2 L of red tinged cloudy fluid was removed. Samples were sent to the laboratory as requested by the clinical team. IMPRESSION: Successful ultrasound-guided paracentesis yielding 2 liters of red tinged peritoneal fluid. Electronically Signed   By: Monte Fantasia M.D.   On: 12/13/2016 14:28     1135:  Pt has been ambulatory several times while in the ED, gait steady, resps easy, NAD. Has tol PO well without N/V. Pt remains afebrile, normal WBC count, minimal abd tenderness on palp with soft distention. EPIC chart reviewed: hx ascites, but never enough for tap. Today's CT with large amount of ascites. T/C to GI Dr. Gala Romney, case discussed, including:  HPI, pertinent PM/SHx, VS/PE, dx testing, ED course and treatment:  Agrees with ED workup, requests to send pt to [place] for evaluation for paracentesis, if there is enough fluid please obtain:  Cytology, cell count/diff, aerobic and anaerobic cultures, gram stain, AFB stain and culture, then OK to d/c pt with symptomatic treatment, and have pt f/u in office in 1 week.   1145:  T/C to Renal Dr. Theador Hawthorne, case discussed, including:  HPI, pertinent PM/SHx, VS/PE, dx testing, ED course and treatment:  Pt does not need emergent HD today, have SW call her HD center to secure HD appt for tomorrow, no need to tx potassium given normal EKG and diarrheal illness.   1455:   US paracentesis performed. Pt continues NAD, non-toxic appearing, resps easy, abd benign. States she is ready to go home now. SW has made HD appt for tomorrow at 11am; pt informed. Dx and testing d/w pt and family.  Questions answered.  Verb understanding, agreeable to d/c home with outpt f/u.       Final Clinical Impressions(s) / ED Diagnoses   Final diagnoses:  None    New Prescriptions New Prescriptions   No medications on file     Francine Graven, DO 12/18/16 0805

## 2016-12-13 NOTE — Clinical Social Work Note (Signed)
LCSW spoke with [place] at [facility] She stated that patient could come tomorrow at 11:00.   LCSW communicated this appointment information to Dr. Thurnell Garbe.    Daisha Filosa, Clydene Pugh, LCSW

## 2016-12-13 NOTE — Discharge Instructions (Signed)
Take your usual prescriptions as previously directed.  Eat a bland diet and advance to your regular diet slowly as you can tolerate it.   Avoid full strength juices, as well as milk and milk products until your diarrhea has resolved.  Go to your Hemodialysis center tomorrow at 11am for your treatment; they are expecting you.  Call your regular medical doctor today to schedule a follow up appointment in the next 2 days.  Return to the Emergency Department immediately sooner if worsening.

## 2016-12-14 ENCOUNTER — Telehealth: Payer: Self-pay

## 2016-12-14 DIAGNOSIS — N186 End stage renal disease: Secondary | ICD-10-CM | POA: Diagnosis not present

## 2016-12-14 DIAGNOSIS — D631 Anemia in chronic kidney disease: Secondary | ICD-10-CM | POA: Diagnosis not present

## 2016-12-14 DIAGNOSIS — Z992 Dependence on renal dialysis: Secondary | ICD-10-CM | POA: Diagnosis not present

## 2016-12-14 DIAGNOSIS — D509 Iron deficiency anemia, unspecified: Secondary | ICD-10-CM | POA: Diagnosis not present

## 2016-12-14 NOTE — Telephone Encounter (Signed)
Noted and agree. 

## 2016-12-14 NOTE — Telephone Encounter (Signed)
Pt called and said she had a PARA yesterday and they basically did not tell her anything to do except call your doctor. She wants to know how long she needs to keep the bandage on and when she can take a bath. I spoke to Neil Crouch, PA who said WE DID NOT ORDER THE PARA, but she may take the bandage off anytime and if it starts to drain again she can put another bandaid on it. She may take a bath anytime.  Per Magda Paganini, pt is scheduled to see Dr. Oneida Alar in July, but she should see Dr. Oneida Alar before then. Forwarding to Manuela Schwartz to schedule. Pt dialysis days have changed to Tues, Thurs and she can now schedule appts on Mon, Wed, and Fri, but Dr. Oneida Alar is only in office on Wed of pt's schedule.

## 2016-12-17 DIAGNOSIS — Z992 Dependence on renal dialysis: Secondary | ICD-10-CM | POA: Diagnosis not present

## 2016-12-17 DIAGNOSIS — D631 Anemia in chronic kidney disease: Secondary | ICD-10-CM | POA: Diagnosis not present

## 2016-12-17 DIAGNOSIS — N186 End stage renal disease: Secondary | ICD-10-CM | POA: Diagnosis not present

## 2016-12-17 DIAGNOSIS — D509 Iron deficiency anemia, unspecified: Secondary | ICD-10-CM | POA: Diagnosis not present

## 2016-12-17 NOTE — Telephone Encounter (Signed)
SF next available will be in [place]. Can I use an URG spot for this patient or can she see extender?

## 2016-12-17 NOTE — Telephone Encounter (Signed)
Leslie's note said Dr. Oneida Alar should see pt before July. Forwarding to Dr. Oneida Alar for advice on time. See pt's schedule for dialysis.

## 2016-12-17 NOTE — Telephone Encounter (Addendum)
Pt had AN EGD/DIL MAR 2018 and had no varices. She has known history of tricuspid regurgitation, pulmonary hypertension, and renal failure. IN MAY 2018 SHE HAD AN ALBUMIN 3.6 AND NL AST/ALT AND MILDLY ELEVATED BILIRUBIN AT 2.1.  IF SHE IS HAVING PROBLEMS WITH ASCITES SHE SHOULD CALL DR. BRADSHAW FOR AN APPT. OK TO WAIT FOR OPV IN AUG 2018 WITH DR. Amarea Knapp.

## 2016-12-18 NOTE — Telephone Encounter (Signed)
Pt is aware.  

## 2016-12-18 NOTE — Telephone Encounter (Signed)
Kara Knapp, pt does have an appt on Friday 12/21/2016 with you. Do you want her to keep that appt?

## 2016-12-18 NOTE — Telephone Encounter (Signed)
noted 

## 2016-12-18 NOTE — Telephone Encounter (Signed)
I called and informed the pt. She said she is still having a lot of gas and bloating. She is taking the Protonix everyday. But she has some major gas and burping. She would like to know if there is anything different that can be done for that.  Forwarding to Neil Crouch, PA who saw pt in the office last.

## 2016-12-18 NOTE — Telephone Encounter (Signed)
REMINDER IN EPIC

## 2016-12-18 NOTE — Telephone Encounter (Signed)
It appears that it was made TODAY for abd pain. Ok to keep appt for abd pain.

## 2016-12-19 ENCOUNTER — Emergency Department (HOSPITAL_COMMUNITY): Payer: Medicare Other

## 2016-12-19 ENCOUNTER — Ambulatory Visit: Payer: Medicare Other | Admitting: Family Medicine

## 2016-12-19 ENCOUNTER — Telehealth: Payer: Self-pay | Admitting: Family Medicine

## 2016-12-19 ENCOUNTER — Encounter (HOSPITAL_COMMUNITY): Payer: Self-pay

## 2016-12-19 ENCOUNTER — Emergency Department (HOSPITAL_COMMUNITY)
Admission: EM | Admit: 2016-12-19 | Discharge: 2016-12-20 | Disposition: A | Discharge status: Home / Self Care | Payer: Medicare Other | Attending: Emergency Medicine | Admitting: Emergency Medicine

## 2016-12-19 DIAGNOSIS — Z992 Dependence on renal dialysis: Secondary | ICD-10-CM | POA: Diagnosis not present

## 2016-12-19 DIAGNOSIS — E875 Hyperkalemia: Secondary | ICD-10-CM

## 2016-12-19 DIAGNOSIS — F17210 Nicotine dependence, cigarettes, uncomplicated: Secondary | ICD-10-CM | POA: Insufficient documentation

## 2016-12-19 DIAGNOSIS — I5032 Chronic diastolic (congestive) heart failure: Secondary | ICD-10-CM | POA: Diagnosis not present

## 2016-12-19 DIAGNOSIS — N186 End stage renal disease: Secondary | ICD-10-CM | POA: Insufficient documentation

## 2016-12-19 DIAGNOSIS — Z7982 Long term (current) use of aspirin: Secondary | ICD-10-CM | POA: Diagnosis not present

## 2016-12-19 DIAGNOSIS — K529 Noninfective gastroenteritis and colitis, unspecified: Secondary | ICD-10-CM

## 2016-12-19 DIAGNOSIS — D259 Leiomyoma of uterus, unspecified: Secondary | ICD-10-CM | POA: Insufficient documentation

## 2016-12-19 DIAGNOSIS — R9431 Abnormal electrocardiogram [ECG] [EKG]: Secondary | ICD-10-CM | POA: Diagnosis not present

## 2016-12-19 DIAGNOSIS — Z79899 Other long term (current) drug therapy: Secondary | ICD-10-CM | POA: Diagnosis not present

## 2016-12-19 DIAGNOSIS — J45909 Unspecified asthma, uncomplicated: Secondary | ICD-10-CM | POA: Insufficient documentation

## 2016-12-19 DIAGNOSIS — R109 Unspecified abdominal pain: Secondary | ICD-10-CM | POA: Diagnosis not present

## 2016-12-19 DIAGNOSIS — Z794 Long term (current) use of insulin: Secondary | ICD-10-CM | POA: Insufficient documentation

## 2016-12-19 DIAGNOSIS — E1122 Type 2 diabetes mellitus with diabetic chronic kidney disease: Secondary | ICD-10-CM | POA: Diagnosis not present

## 2016-12-19 DIAGNOSIS — I132 Hypertensive heart and chronic kidney disease with heart failure and with stage 5 chronic kidney disease, or end stage renal disease: Secondary | ICD-10-CM | POA: Diagnosis not present

## 2016-12-19 DIAGNOSIS — R197 Diarrhea, unspecified: Secondary | ICD-10-CM | POA: Diagnosis not present

## 2016-12-19 LAB — COMPREHENSIVE METABOLIC PANEL
ALK PHOS: 301 U/L — AB (ref 38–126)
ALT: 15 U/L (ref 14–54)
ANION GAP: 10 (ref 5–15)
AST: 24 U/L (ref 15–41)
Albumin: 3.1 g/dL — ABNORMAL LOW (ref 3.5–5.0)
BUN: 39 mg/dL — AB (ref 6–20)
CALCIUM: 8.7 mg/dL — AB (ref 8.9–10.3)
CO2: 23 mmol/L (ref 22–32)
Chloride: 101 mmol/L (ref 101–111)
Creatinine, Ser: 5.34 mg/dL — ABNORMAL HIGH (ref 0.44–1.00)
GFR calc Af Amer: 10 mL/min — ABNORMAL LOW (ref >60)
GFR calc non Af Amer: 9 mL/min — ABNORMAL LOW (ref >60)
GLUCOSE: 141 mg/dL — AB (ref 65–99)
Potassium: 6.3 mmol/L (ref 3.5–5.1)
SODIUM: 134 mmol/L — AB (ref 135–145)
TOTAL PROTEIN: 6.7 g/dL (ref 6.5–8.1)
Total Bilirubin: 1.6 mg/dL — ABNORMAL HIGH (ref 0.3–1.2)

## 2016-12-19 LAB — CBC
HCT: 35.5 % — ABNORMAL LOW (ref 36.0–46.0)
HEMOGLOBIN: 11.5 g/dL — AB (ref 12.0–15.0)
MCH: 31.8 pg (ref 26.0–34.0)
MCHC: 32.4 g/dL (ref 30.0–36.0)
MCV: 98.1 fL (ref 78.0–100.0)
Platelets: 222 10*3/uL (ref 150–400)
RBC: 3.62 MIL/uL — ABNORMAL LOW (ref 3.87–5.11)
RDW: 15.4 % (ref 11.5–15.5)
WBC: 7.7 10*3/uL (ref 4.0–10.5)

## 2016-12-19 LAB — I-STAT CHEM 8, ED
BUN: 39 mg/dL — ABNORMAL HIGH (ref 6–20)
CREATININE: 5.7 mg/dL — AB (ref 0.44–1.00)
Calcium, Ion: 1.29 mmol/L (ref 1.15–1.40)
Chloride: 100 mmol/L — ABNORMAL LOW (ref 101–111)
GLUCOSE: 42 mg/dL — AB (ref 65–99)
HCT: 37 % (ref 36.0–46.0)
HEMOGLOBIN: 12.6 g/dL (ref 12.0–15.0)
Potassium: 4.2 mmol/L (ref 3.5–5.1)
Sodium: 138 mmol/L (ref 135–145)
TCO2: 28 mmol/L (ref 0–100)

## 2016-12-19 LAB — MAGNESIUM: MAGNESIUM: 1.8 mg/dL (ref 1.7–2.4)

## 2016-12-19 LAB — CBG MONITORING, ED
GLUCOSE-CAPILLARY: 171 mg/dL — AB (ref 65–99)
Glucose-Capillary: 102 mg/dL — ABNORMAL HIGH (ref 65–99)
Glucose-Capillary: 119 mg/dL — ABNORMAL HIGH (ref 65–99)

## 2016-12-19 LAB — LIPASE, BLOOD: LIPASE: 13 U/L (ref 11–51)

## 2016-12-19 MED ORDER — SODIUM POLYSTYRENE SULFONATE 15 GM/60ML PO SUSP
30.0000 g | Freq: Once | ORAL | Status: AC
  Administered 2016-12-19: 30 g via ORAL
  Filled 2016-12-19: qty 120

## 2016-12-19 MED ORDER — DEXTROSE 50 % IV SOLN
1.0000 | Freq: Once | INTRAVENOUS | Status: AC
  Administered 2016-12-19: 50 mL via INTRAVENOUS
  Filled 2016-12-19: qty 50

## 2016-12-19 MED ORDER — ONDANSETRON 4 MG PO TBDP
4.0000 mg | ORAL_TABLET | Freq: Once | ORAL | Status: AC
  Administered 2016-12-19: 4 mg via ORAL
  Filled 2016-12-19: qty 1

## 2016-12-19 MED ORDER — SODIUM CHLORIDE 0.9 % IV SOLN
1.0000 g | Freq: Once | INTRAVENOUS | Status: AC
  Administered 2016-12-19: 1 g via INTRAVENOUS
  Filled 2016-12-19: qty 10

## 2016-12-19 MED ORDER — INSULIN ASPART 100 UNIT/ML IV SOLN
6.0000 [IU] | Freq: Once | INTRAVENOUS | Status: AC
  Administered 2016-12-19: 6 [IU] via INTRAVENOUS
  Filled 2016-12-19: qty 0.06

## 2016-12-19 MED ORDER — OXYCODONE-ACETAMINOPHEN 5-325 MG PO TABS
1.0000 | ORAL_TABLET | Freq: Once | ORAL | Status: AC
  Administered 2016-12-19: 1 via ORAL
  Filled 2016-12-19: qty 1

## 2016-12-19 MED ORDER — SODIUM BICARBONATE 8.4 % IV SOLN
50.0000 meq | Freq: Once | INTRAVENOUS | Status: AC
  Administered 2016-12-19: 50 meq via INTRAVENOUS
  Filled 2016-12-19: qty 50

## 2016-12-19 NOTE — ED Triage Notes (Signed)
Pt has had abdominal pain and diarrhea x 2-3 weeks.  Went to [facility] a week ago.  Pt had Ultrasound and CT a week ago with no result findings.  Told it could be a virus.

## 2016-12-19 NOTE — ED Notes (Signed)
I Stat Chem 8: Glucose 42. MD Nanavati notified.

## 2016-12-19 NOTE — ED Notes (Signed)
Pt AOx4 juice and PB and crackers given to pt. Will recheck sugar

## 2016-12-19 NOTE — ED Notes (Signed)
Pt given food and ice chips per PCP

## 2016-12-19 NOTE — ED Notes (Signed)
Date and time results received: 12/19/16 now (use smartphrase ".now" to insert current time)  Test: Potassium Critical Value: 6.3  Name of Provider Notified: Kathrynn Humble

## 2016-12-19 NOTE — ED Provider Notes (Addendum)
[facility] DEPT Provider Note   CSN: [id number] Arrival date & time: 12/19/16  1518     History   Chief Complaint Chief Complaint  Patient presents with  . Abdominal Pain  . Diarrhea    HPI Kara Knapp is a 46 y.o. female.  HPI Pt comes in with cc of diarrhea and abd pain. Pt has hx of ESRD on HD (m/w/f - but she is due for HD tomorrow), IDDM, HTN. PT reports that she has intermittent diarrhea that has been present for several months now. She had diarrhea 3-4 days last week, it got better, and then she started again having diarrhea over the past 4 days. Pt has no blood in the stools. Pt  also c/o abd pain in the lower quadrant. She has had the abd pain for several months as well, but it is worse the last 2 weeks. She is to see her Gynecologist and GI doctor within the next week.    Past Medical History:  Diagnosis Date  . Anemia of chronic disease   . Asthma   . Blind left eye   . Bronchitis   . Cataract   . Cholecystitis, acute 05/26/2013   Status post cholecystectomy  . Depression   . Diabetic foot ulcer ([place]) 03/01/2015  . Dialysis patient [facility])    M-W-F; [facility] in [place]  . ESRD on hemodialysis ([place])    Started diaylsis 12/29/15, T, TH, Sat  . Essential hypertension   . Fibroids   . Glaucoma   . History of blood transfusion   . History of pneumonia   . Hyperlipidemia   . Insulin-dependent diabetes mellitus with retinopathy ([place])   . Neuropathy   . Osteomyelitis ([place])    Toe on left foot    Patient Active Problem List   Diagnosis Date Noted  . Constipation 11/08/2016  . Fatty liver 11/08/2016  . Diarrhea   . Rectal bleeding 09/07/2016  . Legally blind 09/06/2016  . Heme positive stool 05/04/2016  . Hepatomegaly 05/04/2016  . Abnormal LFTs 05/04/2016  . Chronic diarrhea 05/04/2016  . Esophageal dysphagia 05/04/2016  . Anemia 05/04/2016  . Mood disorder ([place]) 08/12/2015  . Fibroid, uterine 01/24/2015  . Iron deficiency anemia due to chronic  blood loss 01/24/2015  . Menorrhagia with irregular cycle 01/24/2015  . Chronic diastolic CHF (congestive heart failure) ([place]) 01/21/2015  . Insulin-dependent diabetes mellitus with retinopathy ([place]) 01/21/2015  . Diabetic neuropathy ([place]) 01/21/2015  . ESRD on dialysis ([place]) 01/21/2015  . Anemia of chronic disease 01/21/2015  . Tobacco abuse 01/21/2015  . Cholecystitis, acute 05/26/2013  . Essential hypertension, benign 05/25/2013    Past Surgical History:  Procedure Laterality Date  . A/V SHUNTOGRAM N/A 10/25/2016   Procedure: A/V Shuntogram - Right Arm;  Surgeon: Waynetta Sandy, MD;  Location: [facility];  Service: Cardiovascular;  Laterality: N/A;  . AV FISTULA PLACEMENT Right 10/17/2015   Procedure: INSERTION OF ARTERIOVENOUS GORE-TEX GRAFT RIGHT UPPER ARM WITH ACUSEAL;  Surgeon: Conrad Cedar Grove, MD;  Location: [facility];  Service: Vascular;  Laterality: Right;  . CATARACT EXTRACTION W/ INTRAOCULAR LENS IMPLANT Bilateral   . CESAREAN SECTION    . CHOLECYSTECTOMY N/A 05/25/2013   Procedure: LAPAROSCOPIC CHOLECYSTECTOMY;  Surgeon: Jamesetta So, MD;  Location: [facility];  Service: General;  Laterality: N/A;  . COLONOSCOPY WITH PROPOFOL N/A 10/02/2016   Procedure: COLONOSCOPY WITH PROPOFOL;  Surgeon: Danie Binder, MD;  Location: [facility];  Service: Endoscopy;  Laterality: N/A;  145 - pt knows to arrive at 11:15 per office  . ESOPHAGOGASTRODUODENOSCOPY (EGD) WITH PROPOFOL N/A 10/02/2016   Procedure: ESOPHAGOGASTRODUODENOSCOPY (EGD) WITH PROPOFOL;  Surgeon: Danie Binder, MD;  [facility];  Service: Endoscopy;  Laterality: N/A;  . EYE SURGERY    . PARS PLANA VITRECTOMY Left 11/24/2014   Procedure: PARS PLANA VITRECTOMY WITH 25 GAUGE;  Surgeon: Hurman Horn, MD;  Location: [facility];  Service: Ophthalmology;  Laterality: Left;  . PERIPHERAL VASCULAR CATHETERIZATION N/A 04/28/2015   Procedure: Bilateral Upper Extremity Venography;  Surgeon: Conrad Lavon, MD;  Location:  [facility];  Service: Cardiovascular;  Laterality: N/A;  . PHOTOCOAGULATION WITH LASER Left 11/24/2014   Procedure: PHOTOCOAGULATION WITH LASER;  Surgeon: Hurman Horn, MD;  Location: [facility];  Service: Ophthalmology;  Laterality: Left;  with insertion of silicone oil  . SAVORY DILATION N/A 10/02/2016   Procedure: SAVORY DILATION;  Surgeon: Danie Binder, MD;  [facility];  Service: Endoscopy;  Laterality: N/A;  . TUBAL LIGATION      OB History    Gravida Para Term Preterm AB Living   5 5           SAB TAB Ectopic Multiple Live Births                   Home Medications    Prior to Admission medications   Medication Sig Start Date End Date Taking? Authorizing Provider  acetaminophen (TYLENOL) 325 MG tablet Take 650 mg by mouth every 6 (six) hours as needed for mild pain.   Yes [provider]  aspirin 81 MG tablet Take 81 mg by mouth daily.   Yes [provider]  carvedilol (COREG) 12.5 MG tablet Take 1 tablet (12.5 mg total) by mouth 2 (two) times daily. 06/13/16  Yes Timmothy Euler, MD  cholecalciferol (VITAMIN D) 1000 units tablet Take 1,000 Units by mouth daily.    Yes [provider]  citalopram (CELEXA) 20 MG tablet TAKE ONE TABLET BY MOUTH DAILY. 11/12/16  Yes Timmothy Euler, MD  gabapentin (NEURONTIN) 300 MG capsule Take 300 mg by mouth 2 (two) times daily.   Yes [provider]  hydrALAZINE (APRESOLINE) 25 MG tablet Take 1 tablet (25 mg total) by mouth 3 (three) times daily. Patient taking differently: Take 25 mg by mouth 2 (two) times daily.  07/08/15  Yes Dettinger, Fransisca Kaufmann, MD  insulin lispro (HUMALOG KWIKPEN) 100 UNIT/ML KiwkPen INJECT 7-10 UNITS INTO THE SKIN THREE TIMES DAILY. 06/13/16  Yes Timmothy Euler, MD  pantoprazole (PROTONIX) 40 MG tablet Take 1 tablet (40 mg total) by mouth daily. 10/09/16  Yes Carlis Stable, NP  sevelamer carbonate (RENVELA) 800 MG tablet Take 2,400 mg by mouth 3 (three) times daily  with meals. And with snacks.   Yes [provider]  blood glucose meter kit and supplies KIT Podigy Voice GLucometer - patient is visually impaired and needs meter that reads results.  Use to check BG up to QID.  Dx: E11.319 Z79.4 05/14/15   Cherre Robins, PharmD  blood glucose meter kit and supplies KIT Dispense based on patient and insurance preference. Use up to four times daily as directed. (FOR ICD-9 250.00, 250.01). 04/27/16   Wardell Honour, MD  Blood Glucose Monitoring Suppl (PRODIGY VOICE BLOOD GLUCOSE) w/Device KIT 1 Device by Does not apply route 2 (two) times daily. 08/31/16   Timmothy Euler, MD  fluticasone Asencion Islam)  50 MCG/ACT nasal spray Place 2 sprays into both nostrils daily as needed for allergies. Patient not taking: Reported on 12/19/2016 09/05/16   Dettinger, Fransisca Kaufmann, MD  glucose blood (PRODIGY NO CODING BLOOD GLUC) test strip Test twice daily 12/10/16   Timmothy Euler, MD  mupirocin ointment (BACTROBAN) 2 % Apply 1 application topically 2 (two) times daily. Patient not taking: Reported on 12/13/2016 11/06/16   Timmothy Euler, MD  polyethylene glycol powder (GLYCOLAX/MIRALAX) powder Take 17 grams daily as needed for constipation. 11/08/16   Mahala Menghini, PA-C  PRODIGY TWIST TOP LANCETS 28G MISC USE TO CHECK BLOOD SUGAR UP TO FOUR TIMES DAILY. 12/10/16   Timmothy Euler, MD  triamcinolone cream (KENALOG) 0.1 % Apply 1 application topically 2 (two) times daily. Patient not taking: Reported on 12/13/2016 11/06/16   Timmothy Euler, MD    Family History Family History  Problem Relation Age of Onset  . COPD Mother   . Cancer Father   . Lymphoma Father   . Diabetes Sister   . Deep vein thrombosis Sister   . Diabetes Brother   . Hyperlipidemia Brother   . Hypertension Brother   . Mental retardation Sister   . Colon cancer Neg Hx   . Liver disease Neg Hx     Social History Social History  Substance Use Topics  . Smoking status: Current Every Day  Smoker    Packs/day: 1.00    Years: 21.00    Types: Cigarettes  . Smokeless tobacco: Never Used     Comment: one pack daily  . Alcohol use No     Allergies   Bactrim [sulfamethoxazole-trimethoprim] and Prednisone   Review of Systems Review of Systems  Constitutional: Negative for activity change and appetite change.  Gastrointestinal: Positive for abdominal pain and diarrhea. Negative for nausea and vomiting.  Genitourinary: Positive for pelvic pain. Negative for dysuria and frequency.  All other systems reviewed and are negative.    Physical Exam Updated Vital Signs BP (!) 152/81 (BP Location: Left Arm)   Pulse (!) 58   Temp 98.2 F (36.8 C) (Oral)   Resp 20   LMP 05/05/2015   SpO2 95%   Physical Exam  Constitutional: She is oriented to person, place, and time. She appears well-developed and well-nourished.  HENT:  Head: Normocephalic and atraumatic.  MMM  Eyes: EOM are normal. Pupils are equal, round, and reactive to light.  Neck: Neck supple.  Cardiovascular: Normal rate, regular rhythm and normal heart sounds.   No murmur heard. Pulmonary/Chest: Effort normal. No respiratory distress.  Abdominal: Soft. She exhibits no distension. There is tenderness. There is no rebound and no guarding.  Lower quadrant tenderness  Neurological: She is alert and oriented to person, place, and time.  Skin: Skin is warm and dry.  Nursing note and vitals reviewed.    ED Treatments / Results  Labs (all labs ordered are listed, but only abnormal results are displayed) Labs Reviewed  COMPREHENSIVE METABOLIC PANEL - Abnormal; Notable for the following:       Result Value   Sodium 134 (*)    Potassium 6.3 (*)    Glucose, Bld 141 (*)    BUN 39 (*)    Creatinine, Ser 5.34 (*)    Calcium 8.7 (*)    Albumin 3.1 (*)    Alkaline Phosphatase 301 (*)    Total Bilirubin 1.6 (*)    GFR calc non Af Amer 9 (*)    GFR calc  Af Amer 10 (*)    All other components within normal limits   CBC - Abnormal; Notable for the following:    RBC 3.62 (*)    Hemoglobin 11.5 (*)    HCT 35.5 (*)    All other components within normal limits  CBG MONITORING, ED - Abnormal; Notable for the following:    Glucose-Capillary 171 (*)    All other components within normal limits  I-STAT CHEM 8, ED - Abnormal; Notable for the following:    Chloride 100 (*)    BUN 39 (*)    Creatinine, Ser 5.70 (*)    Glucose, Bld 42 (*)    All other components within normal limits  CBG MONITORING, ED - Abnormal; Notable for the following:    Glucose-Capillary 102 (*)    All other components within normal limits  LIPASE, BLOOD  MAGNESIUM  URINALYSIS, ROUTINE W REFLEX MICROSCOPIC    EKG  EKG Interpretation  Date/Time:  Wednesday Dec 19 2016 19:15:42 EDT Ventricular Rate:  58 PR Interval:    QRS Duration: 90 QT Interval:  469 QTC Calculation: 461 R Axis:   110 Text Interpretation:  Sinus rhythm Lateral infarct, old Anteroseptal infarct, age indeterminate No acute changes Confirmed by Varney Biles ([id number]) on 12/19/2016 8:35:24 PM       Radiology Dg Abd Acute W/chest  Result Date: 12/19/2016 CLINICAL DATA:  Abdominal pain and diarrhea. EXAM: DG ABDOMEN ACUTE W/ 1V CHEST COMPARISON:  Two-view chest x-ray 12/13/2016. CT of the abdomen and pelvis 12/13/2016. FINDINGS: Heart is enlarged. There is no edema or effusion to suggest failure. No focal airspace disease is present. The bowel gas pattern is normal. There is no obstruction or free air. Surgical clips are noted at the gallbladder fossa. IMPRESSION: 1. Cardiomegaly without failure. 2. Negative abdominal radiographs. Electronically Signed   By: San Morelle M.D.   On: 12/19/2016 20:25    Procedures Procedures (including critical care time)  CRITICAL CARE Performed by: Varney Biles   Total critical care time: 38 minutes for hyperkalemia requiring meds  Critical care time was exclusive of separately billable procedures and  treating other patients.  Critical care was necessary to treat or prevent imminent or life-threatening deterioration.  Critical care was time spent personally by me on the following activities: development of treatment plan with patient and/or surrogate as well as nursing, discussions with consultants, evaluation of patient's response to treatment, examination of patient, obtaining history from patient or surrogate, ordering and performing treatments and interventions, ordering and review of laboratory studies, ordering and review of radiographic studies, pulse oximetry and re-evaluation of patient's condition.   Medications Ordered in ED Medications  oxyCODONE-acetaminophen (PERCOCET/ROXICET) 5-325 MG per tablet 1 tablet (1 tablet Oral Given 12/19/16 1813)  sodium polystyrene (KAYEXALATE) 15 GM/60ML suspension 30 g (30 g Oral Given 12/19/16 1927)  calcium gluconate 1 g in sodium chloride 0.9 % 100 mL IVPB (0 g Intravenous Stopped 12/19/16 2128)  sodium bicarbonate injection 50 mEq (50 mEq Intravenous Given 12/19/16 1915)  insulin aspart (novoLOG) injection 6 Units (6 Units Intravenous Given 12/19/16 2106)  dextrose 50 % solution 50 mL (50 mLs Intravenous Given 12/19/16 1921)  sodium polystyrene (KAYEXALATE) 15 GM/60ML suspension 30 g (30 g Oral Given 12/19/16 2109)  ondansetron (ZOFRAN-ODT) disintegrating tablet 4 mg (4 mg Oral Given 12/19/16 2105)     Initial Impression / Assessment and Plan / ED Course  I have reviewed the triage vital signs and the nursing notes.  Pertinent labs &  imaging results that were available during my care of the patient were reviewed by me and considered in my medical decision making (see chart for details).  Clinical Course as of Dec 20 2339  Wed Dec 19, 2016  1854 Pt's K is high. EKG ordered. HyperK meds ordered. Potassium: (!!) 6.3 [AN]  2043 I spoke with Dr. Florene Glen, Nephrology. We discussed the HPI, ESRD hx, HD time 10 am tomorrow, EKG and K value. He  recommended at least 50 grams of kayexelate and repeat K to ensure it is < 6, and d/c. Pt informed.  [AN]  2339 Repeat K is 4.2. Glucose is 42. Pt is ao x 3, and has no jittery feeling. Oral feeds given. Pt has been npo here, which I reckon caused it.  Glucose: (!!) 42 [AN]  2340 CBG improved. Family driving pt home. Strict ER return precautions have been discussed, and patient is agreeing with the plan and is comfortable with the workup done and the recommendations from the ER. Recommended that she check her sugar again before going to bed. Glucose-Capillary: (!) 102 [AN]    Clinical Course User Index [AN] Varney Biles, MD    Pt comes in with cc of diarrhea, abd pain. None of the symptoms are acute. She was seen in the ER last week - she had paracentesis done for the newly discovered ascites. Pt's abd pain is in the lower quadrant, we will get AAS to r/o free air and to ensure there is no volume overload. Pt's lab show hyperK. EKG is pending. Pt will need meds for the hyperK to be controlled.    Final Clinical Impressions(s) / ED Diagnoses   Final diagnoses:  Chronic diarrhea of unknown origin  Uterine leiomyoma, unspecified location  Hyperkalemia    New Prescriptions New Prescriptions   No medications on file     Varney Biles, MD 12/19/16 2045    Varney Biles, MD 12/19/16 [phone]

## 2016-12-19 NOTE — ED Notes (Signed)
Unable to start Calcium gluconate as only IV access at this time is in hand. Unable to obtain IV-Matt,RN to attempt ultrasound IV.

## 2016-12-19 NOTE — Discharge Instructions (Signed)
Please see your GI doctor and Gynecologist as requested for optimal care. Ensure you go for hemodialysis tomorrow.

## 2016-12-20 DIAGNOSIS — Z992 Dependence on renal dialysis: Secondary | ICD-10-CM | POA: Diagnosis not present

## 2016-12-20 DIAGNOSIS — D631 Anemia in chronic kidney disease: Secondary | ICD-10-CM | POA: Diagnosis not present

## 2016-12-20 DIAGNOSIS — N186 End stage renal disease: Secondary | ICD-10-CM | POA: Diagnosis not present

## 2016-12-20 DIAGNOSIS — K529 Noninfective gastroenteritis and colitis, unspecified: Secondary | ICD-10-CM | POA: Diagnosis not present

## 2016-12-20 DIAGNOSIS — D509 Iron deficiency anemia, unspecified: Secondary | ICD-10-CM | POA: Diagnosis not present

## 2016-12-20 MED ORDER — LOPERAMIDE HCL 2 MG PO CAPS: 2.0000 mg | ORAL_CAPSULE | Freq: Four times a day (QID) | ORAL | 0 refills | Status: DC | PRN

## 2016-12-20 MED ORDER — TRAMADOL HCL 50 MG PO TABS: 50.0000 mg | ORAL_TABLET | Freq: Four times a day (QID) | ORAL | 0 refills | Status: DC | PRN

## 2016-12-20 NOTE — Telephone Encounter (Signed)
Done and faxed

## 2016-12-21 ENCOUNTER — Encounter: Payer: Self-pay | Admitting: Gastroenterology

## 2016-12-21 ENCOUNTER — Ambulatory Visit (INDEPENDENT_AMBULATORY_CARE_PROVIDER_SITE_OTHER): Payer: Medicare Other | Admitting: Gastroenterology

## 2016-12-21 VITALS — BP 145/74 | HR 56 | Temp 97.1°F | Ht 63.0 in | Wt 160.4 lb

## 2016-12-21 DIAGNOSIS — K529 Noninfective gastroenteritis and colitis, unspecified: Secondary | ICD-10-CM | POA: Diagnosis not present

## 2016-12-21 DIAGNOSIS — K760 Fatty (change of) liver, not elsewhere classified: Secondary | ICD-10-CM | POA: Diagnosis not present

## 2016-12-21 DIAGNOSIS — R188 Other ascites: Secondary | ICD-10-CM | POA: Insufficient documentation

## 2016-12-21 MED ORDER — DICYCLOMINE HCL 10 MG PO CAPS: 10.0000 mg | ORAL_CAPSULE | Freq: Three times a day (TID) | ORAL | 0 refills | Status: DC

## 2016-12-21 NOTE — Progress Notes (Signed)
Primary Care Physician: Timmothy Euler, MD  Primary Gastroenterologist:  Barney Drain, MD   Chief Complaint  Patient presents with  . Diarrhea    went to ER x2 in past week  . Abdominal Pain    HPI: Kara Knapp is a 46 y.o. female hereFor evaluation of diarrhea and abdominal pain. She was last seen in April for follow-up. She has a history of chronic diarrhea with bowel incontinence since her cholecystectomy in 2014 although she's had some issues prior to that as well. She has a history of hepatomegaly, dysphagia, abnormal LFTs and intermittent rectal bleeding.She had EGD and colonoscopy in March. She had a single 5 mm mucosal nodule at the GE junction which was benign, patchy moderate edema and inflammation, erosions of the gastric fundus, biopsies benign, mild duodenitis. Colonoscopy she had 5 sessile polyps removed, 2 simple adenomas, 3 hyperplastic polyps. Recommend colonoscopy in 5-10 years. Also had hemorrhoids.Multiple serologies and labs were obtained for abnormal LFTs specifically elevated alkaline phosphatase. Her mitochondrial antibody profile is negative, hepatitis B surface antigen negative, folate and B12 level normal. Her celiac screen was negative, ANA, smooth muscle antibody, immunoglobulins all unremarkable. Her ferritin was slightly elevated at 300. Abdominal ultrasound showed fatty liver,? Portal venous collaterals, subcentimeter lesion in the upper pole of the left kidney? Angiomyolipoma but nonspecific. Plans for repeat ultrasound in 6 months for? Early cirrhosis. She also had moderate ascites on that study.  When I saw her in April she actually was having more constipation even though she stopped Imodium. Her diarrhea seemed to settle down. She also complained of ongoing dysphagia status post dilation therefore barium pill esophagram was obtained and was unremarkable except for esophageal dysmotility.  Patient was seen in the ED on 12/13/2016 with complaints  of diarrhea and abdominal cramping. Heme negative stool in ED. Cdiff quick scan negative. GI pathogen panel negative. She had a CT abdomen pelvis without contrast showing hepatomegaly, normal spleen, large amount ascites, edema of the body wall tissues diffusely. There are multiple leiomyomas of the uterus seen. No discrete etiology for the ascites. Patient had 2 L of red tinged peritoneal fluid removed. Unfortunately I do not see any results from lab analysis although I see that radiologists states they submitted them as well as the ED provider states they've ordered them.   Patient was seen in the ED again on May 23 with complaints of abdominal pain and diarrhea. Noted to have potassium of 6.3 requiring Kayexalate.  Central abdominal pain and lower abdominal pain. Crampy in nature. Not necessarily related to meals. Some relief with bowel movement. Constant lower abdominal discomfort felt to be related to fibroids and enlarged uterus. She has a follow-up scheduled with her gynecologist who she has not seen a couple of years.. After last time here, never took the miralax. BM are now regular. Internal hemorrhoid, bothers when she does strain, comes out and then bleeds.Currently have diarrhea For the last 3 weeks. She's had to miss dialysis a couple of times due to diarrhea. They often give her Imodium before she gets on the machine. Sometimes she'll skip a day without a BM after Imodium. Having 5-10 stools daily.    Current Outpatient Prescriptions  Medication Sig Dispense Refill  . acetaminophen (TYLENOL) 325 MG tablet Take 650 mg by mouth every 6 (six) hours as needed for mild pain.    Marland Kitchen aspirin 81 MG tablet Take 81 mg by mouth daily.    . blood glucose meter  kit and supplies KIT Podigy Voice GLucometer - patient is visually impaired and needs meter that reads results.  Use to check BG up to QID.  Dx: E11.319 Z79.4 1 each 0  . blood glucose meter kit and supplies KIT Dispense based on patient and  insurance preference. Use up to four times daily as directed. (FOR ICD-9 250.00, 250.01). 1 each 0  . Blood Glucose Monitoring Suppl (PRODIGY VOICE BLOOD GLUCOSE) w/Device KIT 1 Device by Does not apply route 2 (two) times daily. 1 kit 0  . carvedilol (COREG) 12.5 MG tablet Take 1 tablet (12.5 mg total) by mouth 2 (two) times daily. 180 tablet 1  . cholecalciferol (VITAMIN D) 1000 units tablet Take 1,000 Units by mouth daily.     . citalopram (CELEXA) 20 MG tablet TAKE ONE TABLET BY MOUTH DAILY. 30 tablet 1  . fluticasone (FLONASE) 50 MCG/ACT nasal spray Place 2 sprays into both nostrils daily as needed for allergies. 16 g 11  . gabapentin (NEURONTIN) 300 MG capsule Take 100 mg by mouth 2 (two) times daily.     Marland Kitchen glucose blood (PRODIGY NO CODING BLOOD GLUC) test strip Test twice daily 100 each 1  . hydrALAZINE (APRESOLINE) 25 MG tablet Take 1 tablet (25 mg total) by mouth 3 (three) times daily. (Patient taking differently: Take 25 mg by mouth 2 (two) times daily. ) 90 tablet 1  . insulin lispro (HUMALOG KWIKPEN) 100 UNIT/ML KiwkPen INJECT 7-10 UNITS INTO THE SKIN THREE TIMES DAILY. 45 mL 3  . loperamide (IMODIUM) 2 MG capsule Take 1 capsule (2 mg total) by mouth 4 (four) times daily as needed for diarrhea or loose stools. 12 capsule 0  . pantoprazole (PROTONIX) 40 MG tablet Take 1 tablet (40 mg total) by mouth daily. 90 tablet 1  . PRODIGY TWIST TOP LANCETS 28G MISC USE TO CHECK BLOOD SUGAR UP TO FOUR TIMES DAILY. 100 each 1  . sevelamer carbonate (RENVELA) 800 MG tablet Take 2,400 mg by mouth 3 (three) times daily with meals. And with snacks.    . traMADol (ULTRAM) 50 MG tablet Take 1 tablet (50 mg total) by mouth every 6 (six) hours as needed. 8 tablet 0   No current facility-administered medications for this visit.     Allergies as of 12/21/2016 - Review Complete 12/21/2016  Allergen Reaction Noted  . Bactrim [sulfamethoxazole-trimethoprim] Nausea And Vomiting 05/21/2013  . Prednisone Other  (See Comments) 07/09/2013   Past Medical History:  Diagnosis Date  . Anemia of chronic disease   . Asthma   . Blind left eye   . Bronchitis   . Cataract   . Cholecystitis, acute 05/26/2013   Status post cholecystectomy  . Depression   . Diabetic foot ulcer ([place]) 03/01/2015  . Dialysis patient [facility])    M-W-F; [facility] in [place]  . ESRD on hemodialysis ([place])    Started diaylsis 12/29/15, T, TH, Sat  . Essential hypertension   . Fibroids   . Glaucoma   . History of blood transfusion   . History of pneumonia   . Hyperlipidemia   . Insulin-dependent diabetes mellitus with retinopathy ([place])   . Neuropathy   . Osteomyelitis ([place])    Toe on left foot   Past Surgical History:  Procedure Laterality Date  . A/V SHUNTOGRAM N/A 10/25/2016   Procedure: A/V Shuntogram - Right Arm;  Surgeon: Waynetta Sandy, MD;  Location: [facility];  Service: Cardiovascular;  Laterality: N/A;  . AV FISTULA PLACEMENT Right  10/17/2015   Procedure: INSERTION OF ARTERIOVENOUS GORE-TEX GRAFT RIGHT UPPER ARM WITH ACUSEAL;  Surgeon: Conrad Hindman, MD;  Location: [place];  Service: Vascular;  Laterality: Right;  . CATARACT EXTRACTION W/ INTRAOCULAR LENS IMPLANT Bilateral   . CESAREAN SECTION    . CHOLECYSTECTOMY N/A 05/25/2013   Procedure: LAPAROSCOPIC CHOLECYSTECTOMY;  Surgeon: Jamesetta So, MD;  Location: [facility];  Service: General;  Laterality: N/A;  . COLONOSCOPY WITH PROPOFOL N/A 10/02/2016   Procedure: COLONOSCOPY WITH PROPOFOL;  Surgeon: Danie Binder, MD;  [facility];  Service: Endoscopy;  Laterality: N/A;  145 - pt knows to arrive at 11:15 per office  . ESOPHAGOGASTRODUODENOSCOPY (EGD) WITH PROPOFOL N/A 10/02/2016   Procedure: ESOPHAGOGASTRODUODENOSCOPY (EGD) WITH PROPOFOL;  Surgeon: Danie Binder, MD;  [facility];  Service: Endoscopy;  Laterality: N/A;  . EYE SURGERY    . PARS PLANA VITRECTOMY Left 11/24/2014   Procedure: PARS PLANA VITRECTOMY WITH 25 GAUGE;  Surgeon:  Hurman Horn, MD;  Location: [facility];  Service: Ophthalmology;  Laterality: Left;  . PERIPHERAL VASCULAR CATHETERIZATION N/A 04/28/2015   Procedure: Bilateral Upper Extremity Venography;  Surgeon: Conrad Colfax, MD;  Location: [facility];  Service: Cardiovascular;  Laterality: N/A;  . PHOTOCOAGULATION WITH LASER Left 11/24/2014   Procedure: PHOTOCOAGULATION WITH LASER;  Surgeon: Hurman Horn, MD;  Location: [facility];  Service: Ophthalmology;  Laterality: Left;  with insertion of silicone oil  . SAVORY DILATION N/A 10/02/2016   Procedure: SAVORY DILATION;  Surgeon: Danie Binder, MD;  [facility];  Service: Endoscopy;  Laterality: N/A;  . TUBAL LIGATION       ROS:  General: Negative for anorexia, weight loss, fever, chills, fatigue, weakness. ENT: Negative for hoarseness, difficulty swallowing , nasal congestion. CV: Negative for chest pain, angina, palpitations, dyspnea on exertion, peripheral edema.  Respiratory: Negative for dyspnea at rest, dyspnea on exertion, cough, sputum, wheezing.  GI: See history of present illness. GU:  Negative for dysuria, hematuria, urinary incontinence, urinary frequency, nocturnal urination.  Endo: Negative for unusual weight change.    Physical Examination:   BP (!) 145/74   Pulse (!) 56   Temp 97.1 F (36.2 C) (Oral)   Ht 5' 3"  (1.6 m)   Wt 160 lb 6.4 oz (72.8 kg)   LMP 05/05/2015   BMI 28.41 kg/m   General: Chronically ill-appearing Caucasian female in no acute distress. Eyes: No icterus. Mouth: Oropharyngeal mucosa moist and pink , no lesions erythema or exudate. Lungs: Clear to auscultation bilaterally.  Heart: Regular rate and rhythm, no murmurs rubs or gallops.  Abdomen: Bowel sounds are normal, some tenderness located over the suprapubic region. Palpable uterus, hepatomegaly noted. No splenomegaly. Abdomen is soft. No masses, no abdominal bruits or hernia , no rebound or guarding.   Extremities: No lower extremity edema. No  clubbing or deformities. Neuro: Alert and oriented x 4   Skin: Warm and dry, no jaundice.   Psych: Alert and cooperative, normal mood and affect.  Labs:  Lab Results  Component Value Date   CREATININE 5.70 (H) 12/19/2016   BUN 39 (H) 12/19/2016   NA 138 12/19/2016   K 4.2 12/19/2016   CL 100 (L) 12/19/2016   CO2 23 12/19/2016   Lab Results  Component Value Date   ALT 15 12/19/2016   AST 24 12/19/2016   ALKPHOS 301 (H) 12/19/2016   BILITOT 1.6 (H) 12/19/2016   Lab Results  Component Value  Date   WBC 7.7 12/19/2016   HGB 12.6 12/19/2016   HCT 37.0 12/19/2016   MCV 98.1 12/19/2016   PLT 222 12/19/2016    Imaging Studies: Ct Abdomen Pelvis Wo Contrast  Result Date: 12/13/2016 CLINICAL DATA:  Diarrhea and abdominal cramping over the last 2 days. EXAM: CT ABDOMEN AND PELVIS WITHOUT CONTRAST TECHNIQUE: Multidetector CT imaging of the abdomen and pelvis was performed following the standard protocol without IV contrast. COMPARISON:  12/07/2014 FINDINGS: Lower chest: Lungs are clear except for mild scarring. No pleural or pericardial fluid. The heart is enlarged. There is coronary artery calcification. Hepatobiliary: There is hepatomegaly. Previous cholecystectomy. No focal liver parenchymal lesion is identified. Pancreas: Normal Spleen: Normal Adrenals/Urinary Tract: Adrenal glands are normal. The kidneys are small. There are tiny stones in the lower pole on the right versus vascular calcification. No mass or hydronephrosis. Stomach/Bowel: No primary bowel pathology is identified. No CT evidence of enteritis or obstruction. Vascular/Lymphatic: Aortic atherosclerosis. No aneurysm. IVC is normal. No retroperitoneal adenopathy. Reproductive: Multiple uterine leiomyoma is. No evidence of adnexal mass. Other: Large amount of ascites, freely distributed. Specific etiology indeterminate. I do not see any definable peritoneal mass. Edema of the body wall tissues diffusely. Musculoskeletal: Negative  IMPRESSION: Large amount of ascites, freely distributed. Etiology not clearly established. The patient does appear to have a degree of hepatomegaly, but the spleen is within normal limits in size. Portal and hepatic veins can't be evaluated without contrast. No sign of peritoneal mass. Body wall edema/ anasarca present. Small kidneys without focal or acute finding. No discernible bowel pathology. Multiple leiomyomas of the uterus as seen previously. Electronically Signed   By: Nelson Chimes M.D.   On: 12/13/2016 10:43   Dg Chest 2 View  Result Date: 12/13/2016 CLINICAL DATA:  Diarrhea and abdominal cramps EXAM: CHEST  2 VIEW COMPARISON:  08/21/2016 FINDINGS: Borderline cardiomegaly that is stable. Prominence along the main pulmonary artery contour, stable. Mild vascular appearing fullness of the right hilum. There is no edema, consolidation, effusion, or pneumothorax. No acute osseous finding. IMPRESSION: 1. No acute finding. 2. Prominent main pulmonary artery contour, consider pulmonary hypertension. Electronically Signed   By: Monte Fantasia M.D.   On: 12/13/2016 08:41   US Paracentesis  Result Date: 12/13/2016 INDICATION: Ascites of unknown cause. EXAM: ULTRASOUND GUIDED DIAGNOSTIC PARACENTESIS MEDICATIONS: None. COMPLICATIONS: None immediate. PROCEDURE: Informed written consent was obtained from the patient after a discussion of the risks, benefits and alternatives to treatment. A timeout was performed prior to the initiation of the procedure. Initial ultrasound scanning demonstrates an adequate amount of ascites within the right lower abdominal quadrant. The right lower abdomen was prepped and draped in the usual sterile fashion. 1% lidocaine was used for local anesthesia. Following this, a 19 gauge, 7-cm, Yueh catheter was introduced. An ultrasound image was saved for documentation purposes. The paracentesis was performed. The catheter was removed and a dressing was applied. The patient tolerated the  procedure well without immediate post procedural complication. FINDINGS: A total of approximately 2 L of red tinged cloudy fluid was removed. Samples were sent to the laboratory as requested by the clinical team. IMPRESSION: Successful ultrasound-guided paracentesis yielding 2 liters of red tinged peritoneal fluid. Electronically Signed   By: Monte Fantasia M.D.   On: 12/13/2016 14:28   Dg Abd Acute W/chest  Result Date: 12/19/2016 CLINICAL DATA:  Abdominal pain and diarrhea. EXAM: DG ABDOMEN ACUTE W/ 1V CHEST COMPARISON:  Two-view chest x-ray 12/13/2016. CT of the abdomen and  pelvis 12/13/2016. FINDINGS: Heart is enlarged. There is no edema or effusion to suggest failure. No focal airspace disease is present. The bowel gas pattern is normal. There is no obstruction or free air. Surgical clips are noted at the gallbladder fossa. IMPRESSION: 1. Cardiomegaly without failure. 2. Negative abdominal radiographs. Electronically Signed   By: San Morelle M.D.   On: 12/19/2016 20:25

## 2016-12-21 NOTE — Patient Instructions (Signed)
1. Please follow up with your heart doctor to make sure your fluid accumulation in your stomach (ascites) is not related to your heart. Dr. Oneida Alar is not convinced that it is related to your liver.  2. Please follow up with gynecologist as planned.  3. Start Bentyl 34m once every morning, you may take additional dose up to 4 per day maximum for abdominal pain and diarrhea. HOLD FOR CONSTIPATION.  4. Do not take imodium with the bentyl. If it has been more than four hours since you took the bentyl, you can take imodium if you need it, such as at dialysis.  5. Return to the office to see Dr. FOneida Alarnext time. Discuss possible hemorrhoid banding with her at the visit.

## 2016-12-22 DIAGNOSIS — Z992 Dependence on renal dialysis: Secondary | ICD-10-CM | POA: Diagnosis not present

## 2016-12-22 DIAGNOSIS — D509 Iron deficiency anemia, unspecified: Secondary | ICD-10-CM | POA: Diagnosis not present

## 2016-12-22 DIAGNOSIS — D631 Anemia in chronic kidney disease: Secondary | ICD-10-CM | POA: Diagnosis not present

## 2016-12-22 DIAGNOSIS — N186 End stage renal disease: Secondary | ICD-10-CM | POA: Diagnosis not present

## 2016-12-23 NOTE — Assessment & Plan Note (Signed)
Fatty liver on prior imaging, possible portal venous collaterals seen on ultrasound back in March from an outside facility, recent CT abdomen without contrast with hepatomegaly but otherwise difficult to examine without contrast. No evidence of portal gastropathy on EGD back in March. Patient's platelet count has been normal. Total bilirubin has been slightly elevated, alkaline phosphatase is been up since last year possibly related to her renal disease.  Patient has had ascites, 2 L removed as outlined above. Unfortunately I do not see any results from analysis. Recent echo showed moderate diastolic dysfunction as well as moderately elevated pulmonary pressures. It is unclear the etiology of her ascites but not clearly related to liver disease without evidence of portal hypertension. Patient was previously advised by Dr. Oneida Alar to follow-up with her PCP regarding ascites and plans to see Dr. fields in the next couple months. If she requires repeat abdominal paracentesis, would consider fluid analysis including cell count with differential, cultures, cytology at minimal.

## 2016-12-23 NOTE — Assessment & Plan Note (Signed)
Chronic diarrhea, intermittent nature. Fluctuates with constipation but mostly diarrhea for the past 3 weeks. Interfering with dialysis. Imodium does seem to help but she does not take regularly. Colonoscopy and EGD have been done as outlined above. Possibly medication related, IBS, bile acid diarrhea. Trial of Bentyl, see patient instructions. Patient will come back to see Dr. fields in a couple months but call sooner if needed. She has intermittent bright red blood per rectum she feels is related to her hemorrhoids, briefly discussed hemorrhoid banding which might be a possibility for her but she can discuss at her next office visit once we get her bowel issues under control.

## 2016-12-25 DIAGNOSIS — D631 Anemia in chronic kidney disease: Secondary | ICD-10-CM | POA: Diagnosis not present

## 2016-12-25 DIAGNOSIS — D509 Iron deficiency anemia, unspecified: Secondary | ICD-10-CM | POA: Diagnosis not present

## 2016-12-25 DIAGNOSIS — N186 End stage renal disease: Secondary | ICD-10-CM | POA: Diagnosis not present

## 2016-12-25 DIAGNOSIS — Z992 Dependence on renal dialysis: Secondary | ICD-10-CM | POA: Diagnosis not present

## 2016-12-25 NOTE — Progress Notes (Signed)
cc'ed to pcp °

## 2016-12-27 ENCOUNTER — Ambulatory Visit: Payer: Self-pay | Admitting: Family Medicine

## 2016-12-27 DIAGNOSIS — D631 Anemia in chronic kidney disease: Secondary | ICD-10-CM | POA: Diagnosis not present

## 2016-12-27 DIAGNOSIS — N186 End stage renal disease: Secondary | ICD-10-CM | POA: Diagnosis not present

## 2016-12-27 DIAGNOSIS — Z992 Dependence on renal dialysis: Secondary | ICD-10-CM | POA: Diagnosis not present

## 2016-12-27 DIAGNOSIS — D509 Iron deficiency anemia, unspecified: Secondary | ICD-10-CM | POA: Diagnosis not present

## 2016-12-28 ENCOUNTER — Ambulatory Visit: Payer: Medicare Other | Admitting: Family Medicine

## 2016-12-29 DIAGNOSIS — D509 Iron deficiency anemia, unspecified: Secondary | ICD-10-CM | POA: Diagnosis not present

## 2016-12-29 DIAGNOSIS — D631 Anemia in chronic kidney disease: Secondary | ICD-10-CM | POA: Diagnosis not present

## 2016-12-29 DIAGNOSIS — N186 End stage renal disease: Secondary | ICD-10-CM | POA: Diagnosis not present

## 2016-12-29 DIAGNOSIS — N250 Renal osteodystrophy: Secondary | ICD-10-CM | POA: Diagnosis not present

## 2016-12-29 DIAGNOSIS — Z992 Dependence on renal dialysis: Secondary | ICD-10-CM | POA: Diagnosis not present

## 2016-12-31 ENCOUNTER — Telehealth: Payer: Self-pay | Admitting: Family Medicine

## 2017-01-01 DIAGNOSIS — N250 Renal osteodystrophy: Secondary | ICD-10-CM | POA: Diagnosis not present

## 2017-01-01 DIAGNOSIS — D509 Iron deficiency anemia, unspecified: Secondary | ICD-10-CM | POA: Diagnosis not present

## 2017-01-01 DIAGNOSIS — D631 Anemia in chronic kidney disease: Secondary | ICD-10-CM | POA: Diagnosis not present

## 2017-01-01 DIAGNOSIS — N186 End stage renal disease: Secondary | ICD-10-CM | POA: Diagnosis not present

## 2017-01-01 DIAGNOSIS — Z992 Dependence on renal dialysis: Secondary | ICD-10-CM | POA: Diagnosis not present

## 2017-01-01 NOTE — Telephone Encounter (Signed)
resent

## 2017-01-02 ENCOUNTER — Other Ambulatory Visit: Payer: Medicare Other | Admitting: Obstetrics and Gynecology

## 2017-01-03 DIAGNOSIS — Z992 Dependence on renal dialysis: Secondary | ICD-10-CM | POA: Diagnosis not present

## 2017-01-03 DIAGNOSIS — N250 Renal osteodystrophy: Secondary | ICD-10-CM | POA: Diagnosis not present

## 2017-01-03 DIAGNOSIS — N186 End stage renal disease: Secondary | ICD-10-CM | POA: Diagnosis not present

## 2017-01-03 DIAGNOSIS — D509 Iron deficiency anemia, unspecified: Secondary | ICD-10-CM | POA: Diagnosis not present

## 2017-01-03 DIAGNOSIS — D631 Anemia in chronic kidney disease: Secondary | ICD-10-CM | POA: Diagnosis not present

## 2017-01-05 DIAGNOSIS — N186 End stage renal disease: Secondary | ICD-10-CM | POA: Diagnosis not present

## 2017-01-05 DIAGNOSIS — D509 Iron deficiency anemia, unspecified: Secondary | ICD-10-CM | POA: Diagnosis not present

## 2017-01-05 DIAGNOSIS — D631 Anemia in chronic kidney disease: Secondary | ICD-10-CM | POA: Diagnosis not present

## 2017-01-05 DIAGNOSIS — Z992 Dependence on renal dialysis: Secondary | ICD-10-CM | POA: Diagnosis not present

## 2017-01-05 DIAGNOSIS — N250 Renal osteodystrophy: Secondary | ICD-10-CM | POA: Diagnosis not present

## 2017-01-07 ENCOUNTER — Ambulatory Visit (INDEPENDENT_AMBULATORY_CARE_PROVIDER_SITE_OTHER): Payer: Medicare Other | Admitting: Family Medicine

## 2017-01-07 ENCOUNTER — Encounter: Payer: Self-pay | Admitting: Family Medicine

## 2017-01-07 VITALS — BP 172/79 | HR 57 | Temp 97.0°F | Ht 63.0 in | Wt 161.0 lb

## 2017-01-07 DIAGNOSIS — R0982 Postnasal drip: Secondary | ICD-10-CM

## 2017-01-07 DIAGNOSIS — F39 Unspecified mood [affective] disorder: Secondary | ICD-10-CM

## 2017-01-07 DIAGNOSIS — IMO0001 Reserved for inherently not codable concepts without codable children: Secondary | ICD-10-CM

## 2017-01-07 DIAGNOSIS — E11319 Type 2 diabetes mellitus with unspecified diabetic retinopathy without macular edema: Secondary | ICD-10-CM

## 2017-01-07 DIAGNOSIS — Z794 Long term (current) use of insulin: Secondary | ICD-10-CM | POA: Diagnosis not present

## 2017-01-07 LAB — BAYER DCA HB A1C WAIVED: HB A1C: 6.4 % (ref <7.0)

## 2017-01-07 MED ORDER — TRAZODONE HCL 50 MG PO TABS: 25.0000 mg | ORAL_TABLET | Freq: Every evening | ORAL | 3 refills | Status: DC | PRN

## 2017-01-07 NOTE — Patient Instructions (Signed)
Great to see you!  Try trazodone, continue celexa  Try flonase 1 spray per nostril daily for the throat soreness and runny nose

## 2017-01-07 NOTE — Progress Notes (Signed)
   HPI  Patient presents today to discuss diabetes, depression, and sore throat.  Diabetes Patient stopped insulin about 3 months ago. She states that she is watching her diet and feels that dialysis is probably helping her sugars. She has not been checking her sugars lately.  Sore throat Patient has had ear fullness, sinus congestion, and her throat. Everything has improved except for the sore throat which is slightly better today compared to yesterday.  No chest pain, dyspnea, palpitations.  Depression Patient has been working with our [facility] team, they have recommended increase Celexa. However patient complains of difficulty sleeping and requests medication for this. She denies suicidal thoughts.   PMH: Smoking status noted ROS: Per HPI  Objective: BP (!) 172/79   Pulse (!) 57   Temp 97 F (36.1 C) (Oral)   Ht 5' 3"  (1.6 m)   Wt 161 lb (73 kg)   LMP 05/05/2015   BMI 28.52 kg/m  Gen: NAD, alert, cooperative with exam HEENT: NCAT, erythematous swollen turbinates CV: RRR, good S1/S2, no murmur Resp: CTABL, no wheezes, non-labored Ext: No edema, warm Neuro: Alert and oriented, No gross deficits  Assessment and plan:  # IDDM Well-controlled, patient admits that she has stopped insulin, since she has stopped for the last 3 months and is well controlled I think it's reasonable to stop completely. Continue to monitor CBGs.  # Mood disorder Primarily depression, improved somewhat, however with sleep disturbance With difficulty sleeping I have added trazodone 50 mg today. Continue Celexa 20 mg for now  # Postnasal drip Patient with sore throat, likely etiology is postnasal drip Start Flonase    Orders Placed This Encounter  Procedures  . Bayer DCA Hb A1c Waived    Meds ordered this encounter  Medications  . traZODone (DESYREL) 50 MG tablet    Sig: Take 0.5-1 tablets (25-50 mg total) by mouth at bedtime as needed for sleep.    Dispense:  30 tablet    Refill:  [place], MD [facility] 01/07/2017, 3:30 PM

## 2017-01-08 ENCOUNTER — Ambulatory Visit (INDEPENDENT_AMBULATORY_CARE_PROVIDER_SITE_OTHER): Payer: Medicare Other | Admitting: Urology

## 2017-01-08 DIAGNOSIS — D3000 Benign neoplasm of unspecified kidney: Secondary | ICD-10-CM

## 2017-01-09 DIAGNOSIS — Z992 Dependence on renal dialysis: Secondary | ICD-10-CM | POA: Diagnosis not present

## 2017-01-09 DIAGNOSIS — N186 End stage renal disease: Secondary | ICD-10-CM | POA: Diagnosis not present

## 2017-01-09 DIAGNOSIS — D631 Anemia in chronic kidney disease: Secondary | ICD-10-CM | POA: Diagnosis not present

## 2017-01-09 DIAGNOSIS — D509 Iron deficiency anemia, unspecified: Secondary | ICD-10-CM | POA: Diagnosis not present

## 2017-01-09 DIAGNOSIS — N250 Renal osteodystrophy: Secondary | ICD-10-CM | POA: Diagnosis not present

## 2017-01-11 DIAGNOSIS — N250 Renal osteodystrophy: Secondary | ICD-10-CM | POA: Diagnosis not present

## 2017-01-11 DIAGNOSIS — Z992 Dependence on renal dialysis: Secondary | ICD-10-CM | POA: Diagnosis not present

## 2017-01-11 DIAGNOSIS — D631 Anemia in chronic kidney disease: Secondary | ICD-10-CM | POA: Diagnosis not present

## 2017-01-11 DIAGNOSIS — D509 Iron deficiency anemia, unspecified: Secondary | ICD-10-CM | POA: Diagnosis not present

## 2017-01-11 DIAGNOSIS — N186 End stage renal disease: Secondary | ICD-10-CM | POA: Diagnosis not present

## 2017-01-12 DIAGNOSIS — D631 Anemia in chronic kidney disease: Secondary | ICD-10-CM | POA: Diagnosis not present

## 2017-01-12 DIAGNOSIS — D509 Iron deficiency anemia, unspecified: Secondary | ICD-10-CM | POA: Diagnosis not present

## 2017-01-12 DIAGNOSIS — Z992 Dependence on renal dialysis: Secondary | ICD-10-CM | POA: Diagnosis not present

## 2017-01-12 DIAGNOSIS — N186 End stage renal disease: Secondary | ICD-10-CM | POA: Diagnosis not present

## 2017-01-12 DIAGNOSIS — N250 Renal osteodystrophy: Secondary | ICD-10-CM | POA: Diagnosis not present

## 2017-01-14 ENCOUNTER — Encounter: Payer: Self-pay | Admitting: Obstetrics and Gynecology

## 2017-01-14 ENCOUNTER — Other Ambulatory Visit (HOSPITAL_COMMUNITY)
Admission: RE | Admit: 2017-01-14 | Discharge: 2017-01-14 | Disposition: A | Discharge status: Home / Self Care | Payer: Medicare Other | Source: Ambulatory Visit | Attending: Obstetrics and Gynecology | Admitting: Obstetrics and Gynecology

## 2017-01-14 ENCOUNTER — Ambulatory Visit (INDEPENDENT_AMBULATORY_CARE_PROVIDER_SITE_OTHER): Payer: Medicare Other | Admitting: Obstetrics and Gynecology

## 2017-01-14 VITALS — BP 108/60 | HR 59 | Ht 63.39 in | Wt 155.0 lb

## 2017-01-14 DIAGNOSIS — Z01419 Encounter for gynecological examination (general) (routine) without abnormal findings: Secondary | ICD-10-CM | POA: Insufficient documentation

## 2017-01-14 DIAGNOSIS — N186 End stage renal disease: Secondary | ICD-10-CM

## 2017-01-14 DIAGNOSIS — D251 Intramural leiomyoma of uterus: Secondary | ICD-10-CM

## 2017-01-14 DIAGNOSIS — Z992 Dependence on renal dialysis: Secondary | ICD-10-CM

## 2017-01-14 DIAGNOSIS — Z124 Encounter for screening for malignant neoplasm of cervix: Secondary | ICD-10-CM | POA: Diagnosis not present

## 2017-01-14 NOTE — Progress Notes (Addendum)
Assessment:  1. Annual Gyn Exam 2. Post menopausal status  3. ESRD on dialysis  4. Ascites 5. Pelvic pressure secondary to ascites  Plan:  1. pap smear done, next pap due in 3 years  2. return annually or prn 3    Annual mammogram advised Subjective:  Kara Knapp is a 46 y.o. female [place] who presents for annual exam. Patient's last menstrual period was 05/05/2015. The patient has complaints today of moderate abdominal pain. She is concerned her uterus is enlarged. She has a h/o liver disease/ ascites and is currently being followed for this by GI .   The following portions of the patient's history were reviewed and updated as appropriate: allergies, current medications, past family history, past medical history, past social history, past surgical history and problem list. Past Medical History:  Diagnosis Date  . Anemia of chronic disease   . Asthma   . Blind left eye   . Bronchitis   . Cataract   . Cholecystitis, acute 05/26/2013   Status post cholecystectomy  . Congestive heart failure (CHF) ([place])   . Depression   . Diabetic foot ulcer ([place]) 03/01/2015  . Dialysis patient [facility])    M-W-F; [facility] in [place]  . ESRD on hemodialysis ([place])    Started diaylsis 12/29/15, T, TH, Sat  . Essential hypertension   . Fibroids   . Glaucoma   . History of blood transfusion   . History of pneumonia   . Hyperlipidemia   . Insulin-dependent diabetes mellitus with retinopathy ([place])   . Neuropathy   . Osteomyelitis ([place])    Toe on left foot    Past Surgical History:  Procedure Laterality Date  . A/V SHUNTOGRAM N/A 10/25/2016   Procedure: A/V Shuntogram - Right Arm;  Surgeon: Waynetta Sandy, MD;  Location: [place] [facility];  Service: Cardiovascular;  Laterality: N/A;  . AV FISTULA PLACEMENT Right 10/17/2015   Procedure: INSERTION OF ARTERIOVENOUS GORE-TEX GRAFT RIGHT UPPER ARM WITH ACUSEAL;  Surgeon: Conrad Elmendorf, MD;  Location: [place];  Service: Vascular;  Laterality: Right;   . CATARACT EXTRACTION W/ INTRAOCULAR LENS IMPLANT Bilateral   . CESAREAN SECTION    . CHOLECYSTECTOMY N/A 05/25/2013   Procedure: LAPAROSCOPIC CHOLECYSTECTOMY;  Surgeon: Jamesetta So, MD;  [facility];  Service: General;  Laterality: N/A;  . COLONOSCOPY WITH PROPOFOL N/A 10/02/2016   Procedure: COLONOSCOPY WITH PROPOFOL;  Surgeon: Danie Binder, MD;  [facility];  Service: Endoscopy;  Laterality: N/A;  145 - pt knows to arrive at 11:15 per office  . ESOPHAGOGASTRODUODENOSCOPY (EGD) WITH PROPOFOL N/A 10/02/2016   Procedure: ESOPHAGOGASTRODUODENOSCOPY (EGD) WITH PROPOFOL;  Surgeon: Danie Binder, MD;  [facility];  Service: Endoscopy;  Laterality: N/A;  . EYE SURGERY    . PARS PLANA VITRECTOMY Left 11/24/2014   Procedure: PARS PLANA VITRECTOMY WITH 25 GAUGE;  Surgeon: Hurman Horn, MD;  Location: [place] [facility];  Service: Ophthalmology;  Laterality: Left;  . PERIPHERAL VASCULAR CATHETERIZATION N/A 04/28/2015   Procedure: Bilateral Upper Extremity Venography;  Surgeon: Conrad [place], MD;  Location: [place];  Service: Cardiovascular;  Laterality: N/A;  . PHOTOCOAGULATION WITH LASER Left 11/24/2014   Procedure: PHOTOCOAGULATION WITH LASER;  Surgeon: Hurman Horn, MD;  Location: [facility];  Service: Ophthalmology;  Laterality: Left;  with insertion of silicone oil  . SAVORY DILATION N/A 10/02/2016   Procedure: SAVORY DILATION;  Surgeon: Danie Binder, MD;  [facility];  Service: Endoscopy;  Laterality: N/A;  . TUBAL LIGATION       Current Outpatient Prescriptions:  .  acetaminophen (TYLENOL) 325 MG tablet, Take 650 mg by mouth every 6 (six) hours as needed for mild pain., Disp: , Rfl:  .  aspirin 81 MG tablet, Take 81 mg by mouth daily., Disp: , Rfl:  .  blood glucose meter kit and supplies KIT, Podigy Voice GLucometer - patient is visually impaired and needs meter that reads results.  Use to check BG up to QID.  Dx: E11.319 Z79.4, Disp: 1 each, Rfl: 0 .   blood glucose meter kit and supplies KIT, Dispense based on patient and insurance preference. Use up to four times daily as directed. (FOR ICD-9 250.00, 250.01)., Disp: 1 each, Rfl: 0 .  Blood Glucose Monitoring Suppl (PRODIGY VOICE BLOOD GLUCOSE) w/Device KIT, 1 Device by Does not apply route 2 (two) times daily., Disp: 1 kit, Rfl: 0 .  carvedilol (COREG) 12.5 MG tablet, Take 1 tablet (12.5 mg total) by mouth 2 (two) times daily., Disp: 180 tablet, Rfl: 1 .  cholecalciferol (VITAMIN D) 1000 units tablet, Take 1,000 Units by mouth daily. , Disp: , Rfl:  .  citalopram (CELEXA) 20 MG tablet, TAKE ONE TABLET BY MOUTH DAILY., Disp: 30 tablet, Rfl: 1 .  dicyclomine (BENTYL) 10 MG capsule, Take 1 capsule (10 mg total) by mouth 4 (four) times daily -  before meals and at bedtime. As needed for diarrhea. Hold for constipation., Disp: 120 capsule, Rfl: 0 .  fluticasone (FLONASE) 50 MCG/ACT nasal spray, Place 2 sprays into both nostrils daily as needed for allergies., Disp: 16 g, Rfl: 11 .  gabapentin (NEURONTIN) 300 MG capsule, Take 100 mg by mouth 2 (two) times daily. , Disp: , Rfl:  .  glucose blood (PRODIGY NO CODING BLOOD GLUC) test strip, Test twice daily, Disp: 100 each, Rfl: 1 .  hydrALAZINE (APRESOLINE) 25 MG tablet, Take 1 tablet (25 mg total) by mouth 3 (three) times daily. (Patient taking differently: Take 25 mg by mouth 2 (two) times daily. ), Disp: 90 tablet, Rfl: 1 .  pantoprazole (PROTONIX) 40 MG tablet, Take 1 tablet (40 mg total) by mouth daily., Disp: 90 tablet, Rfl: 1 .  PRODIGY TWIST TOP LANCETS 28G MISC, USE TO CHECK BLOOD SUGAR UP TO FOUR TIMES DAILY., Disp: 100 each, Rfl: 1 .  sevelamer carbonate (RENVELA) 800 MG tablet, Take 2,400 mg by mouth 3 (three) times daily with meals. And with snacks., Disp: , Rfl:  .  traMADol (ULTRAM) 50 MG tablet, Take 1 tablet (50 mg total) by mouth every 6 (six) hours as needed., Disp: 8 tablet, Rfl: 0 .  traZODone (DESYREL) 50 MG tablet, Take 0.5-1 tablets  (25-50 mg total) by mouth at bedtime as needed for sleep., Disp: 30 tablet, Rfl: 3  Review of Systems Constitutional: negative Gastrointestinal: positive for abdominal pain Genitourinary: negative  Objective:  BP 108/60 (BP Location: Left Arm, Patient Position: Sitting, Cuff Size: Normal)   Pulse (!) 59   Ht 5' 3.39" (1.61 m)   Wt 155 lb (70.3 kg)   LMP 05/05/2015   BMI 27.12 kg/m    BMI: Body mass index is 27.12 kg/m.  General Appearance: Alert, appropriate appearance for age. No acute distress HEENT: Grossly normal Neck / Thyroid:  Cardiovascular: RRR; normal S1, S2, no murmur Lungs: CTA bilaterally Back: No CVAT Breast Exam: No dimpling, nipple retraction or discharge. No masses or nodes. and No masses or nodes. Gastrointestinal:  Soft, non-tender, no masses or organomegaly Pelvic Exam: pelvic pressure secondary to ascites External genitalia: normal general appearance Vaginal: vaginal tissue is slightly atropic  Cervix: normal appearance Adnexa: normal bimanual exam Uterus: 1st degree utrerine descensus  Pap: done today Lymphatic Exam: Non-palpable nodes in neck, clavicular, axillary, or inguinal regions  Skin: no rash or abnormalities Neurologic: Normal gait and speech, no tremor  Psychiatric: Alert and oriented, appropriate affect.  Urinalysis:Not done  CT is reviewed. The patient has massive ascites both in front and and and behind a large fibroid uterus that is currently stable. There is no ovarian abnormality suspected Mallory Shirk. MD Pgr [phone] 2:13 PM   By signing my name below, I, Evelene Croon, attest that this documentation has been prepared under the direction and in the presence of Jonnie Kind, MD . Electronically Signed: Evelene Croon, Scribe. 01/14/2017. 2:25 PM. I personally performed the services described in this documentation, which was SCRIBED in my presence. The recorded information has been reviewed and considered accurate. It has been  edited as necessary during review. Jonnie Kind, MD

## 2017-01-15 DIAGNOSIS — D509 Iron deficiency anemia, unspecified: Secondary | ICD-10-CM | POA: Diagnosis not present

## 2017-01-15 DIAGNOSIS — N250 Renal osteodystrophy: Secondary | ICD-10-CM | POA: Diagnosis not present

## 2017-01-15 DIAGNOSIS — N186 End stage renal disease: Secondary | ICD-10-CM | POA: Diagnosis not present

## 2017-01-15 DIAGNOSIS — D631 Anemia in chronic kidney disease: Secondary | ICD-10-CM | POA: Diagnosis not present

## 2017-01-15 DIAGNOSIS — Z992 Dependence on renal dialysis: Secondary | ICD-10-CM | POA: Diagnosis not present

## 2017-01-16 LAB — CYTOLOGY - PAP
DIAGNOSIS: NEGATIVE
HPV (WINDOPATH): NOT DETECTED

## 2017-01-17 DIAGNOSIS — D631 Anemia in chronic kidney disease: Secondary | ICD-10-CM | POA: Diagnosis not present

## 2017-01-17 DIAGNOSIS — N250 Renal osteodystrophy: Secondary | ICD-10-CM | POA: Diagnosis not present

## 2017-01-17 DIAGNOSIS — N186 End stage renal disease: Secondary | ICD-10-CM | POA: Diagnosis not present

## 2017-01-17 DIAGNOSIS — Z992 Dependence on renal dialysis: Secondary | ICD-10-CM | POA: Diagnosis not present

## 2017-01-17 DIAGNOSIS — D509 Iron deficiency anemia, unspecified: Secondary | ICD-10-CM | POA: Diagnosis not present

## 2017-01-18 ENCOUNTER — Other Ambulatory Visit: Payer: Self-pay | Admitting: Nurse Practitioner

## 2017-01-18 ENCOUNTER — Other Ambulatory Visit: Payer: Self-pay | Admitting: Family Medicine

## 2017-01-18 DIAGNOSIS — K219 Gastro-esophageal reflux disease without esophagitis: Secondary | ICD-10-CM

## 2017-01-19 DIAGNOSIS — N186 End stage renal disease: Secondary | ICD-10-CM | POA: Diagnosis not present

## 2017-01-19 DIAGNOSIS — D631 Anemia in chronic kidney disease: Secondary | ICD-10-CM | POA: Diagnosis not present

## 2017-01-19 DIAGNOSIS — D509 Iron deficiency anemia, unspecified: Secondary | ICD-10-CM | POA: Diagnosis not present

## 2017-01-19 DIAGNOSIS — N250 Renal osteodystrophy: Secondary | ICD-10-CM | POA: Diagnosis not present

## 2017-01-19 DIAGNOSIS — Z992 Dependence on renal dialysis: Secondary | ICD-10-CM | POA: Diagnosis not present

## 2017-01-23 ENCOUNTER — Telehealth: Payer: Self-pay | Admitting: Gastroenterology

## 2017-01-23 DIAGNOSIS — I259 Chronic ischemic heart disease, unspecified: Secondary | ICD-10-CM | POA: Diagnosis not present

## 2017-01-23 DIAGNOSIS — D509 Iron deficiency anemia, unspecified: Secondary | ICD-10-CM | POA: Diagnosis not present

## 2017-01-23 DIAGNOSIS — D631 Anemia in chronic kidney disease: Secondary | ICD-10-CM | POA: Diagnosis not present

## 2017-01-23 DIAGNOSIS — E119 Type 2 diabetes mellitus without complications: Secondary | ICD-10-CM | POA: Diagnosis not present

## 2017-01-23 DIAGNOSIS — Z794 Long term (current) use of insulin: Secondary | ICD-10-CM | POA: Diagnosis not present

## 2017-01-23 DIAGNOSIS — N250 Renal osteodystrophy: Secondary | ICD-10-CM | POA: Diagnosis not present

## 2017-01-23 DIAGNOSIS — Z992 Dependence on renal dialysis: Secondary | ICD-10-CM | POA: Diagnosis not present

## 2017-01-23 DIAGNOSIS — N186 End stage renal disease: Secondary | ICD-10-CM | POA: Diagnosis not present

## 2017-01-23 NOTE — Telephone Encounter (Signed)
Aug recall for kidney lesion

## 2017-01-23 NOTE — Telephone Encounter (Signed)
Letter mailed to pt.  

## 2017-01-24 ENCOUNTER — Other Ambulatory Visit: Payer: Self-pay | Admitting: Obstetrics and Gynecology

## 2017-01-24 ENCOUNTER — Encounter: Payer: Self-pay | Admitting: Cardiology

## 2017-01-24 DIAGNOSIS — Z992 Dependence on renal dialysis: Secondary | ICD-10-CM | POA: Diagnosis not present

## 2017-01-24 DIAGNOSIS — D509 Iron deficiency anemia, unspecified: Secondary | ICD-10-CM | POA: Diagnosis not present

## 2017-01-24 DIAGNOSIS — D631 Anemia in chronic kidney disease: Secondary | ICD-10-CM | POA: Diagnosis not present

## 2017-01-24 DIAGNOSIS — N186 End stage renal disease: Secondary | ICD-10-CM | POA: Diagnosis not present

## 2017-01-24 DIAGNOSIS — N250 Renal osteodystrophy: Secondary | ICD-10-CM | POA: Diagnosis not present

## 2017-01-24 MED ORDER — METRONIDAZOLE 500 MG PO TABS: 500.0000 mg | ORAL_TABLET | Freq: Two times a day (BID) | ORAL | 0 refills | Status: DC

## 2017-01-24 NOTE — Progress Notes (Unsigned)
Metronidazole 2 gm pt and partner eescribed

## 2017-01-24 NOTE — Progress Notes (Signed)
Cardiology Office Note  Date: 01/28/2017   ID: Kara Knapp, DOB [birth year], MRN [id number]  PCP: Timmothy Euler, MD  Primary Cardiologist: Rozann Lesches, MD   Chief Complaint  Patient presents with  . Cardiac follow-up    History of Present Illness: Kara Knapp is a 46 y.o. female that I met in in November 2017. She is here today for a routine follow-up visit. I reviewed interval records. She continues to follow with Gastroenterology and Nephrology. She underwent a paracentesis of 2 L of fluid back in May, continues to have recurrent abdominal ascites. She has fatty liver based on prior imaging and early cirrhotic changes. Her most recent albumin was mildly low. She states that she has been largely compliant with hemodialysis.  Echocardiogram from February of this year is noted below. LVEF is normal range and she has moderate diastolic dysfunction as well as moderate pulmonary hypertension. Current medications include hydralazine and Coreg. She is not on a diuretic, uncertain whether this would be very effective with her ESRD. Suspect that most of her fluid volume control will be through hemodialysis.  Past Medical History:  Diagnosis Date  . Anemia of chronic disease   . Asthma   . Blind left eye   . Bronchitis   . Cataract   . Cholecystitis, acute 05/26/2013   Status post cholecystectomy  . Depression   . Diabetic foot ulcer ([place]) 03/01/2015  . Diastolic heart failure ([place])   . ESRD on hemodialysis ([place])    Started diaylsis 12/29/15  . Essential hypertension   . Fibroids   . Glaucoma   . History of blood transfusion   . History of pneumonia   . Hyperlipidemia   . Insulin-dependent diabetes mellitus with retinopathy ([place])   . Neuropathy   . Osteomyelitis ([place])    Toe on left foot    Past Surgical History:  Procedure Laterality Date  . A/V SHUNTOGRAM N/A 10/25/2016   Procedure: A/V Shuntogram - Right Arm;  Surgeon: Waynetta Sandy, MD;  Location:  [facility];  Service: Cardiovascular;  Laterality: N/A;  . AV FISTULA PLACEMENT Right 10/17/2015   Procedure: INSERTION OF ARTERIOVENOUS GORE-TEX GRAFT RIGHT UPPER ARM WITH ACUSEAL;  Surgeon: Conrad Corn, MD;  Location: [place];  Service: Vascular;  Laterality: Right;  . CATARACT EXTRACTION W/ INTRAOCULAR LENS IMPLANT Bilateral   . CESAREAN SECTION    . CHOLECYSTECTOMY N/A 05/25/2013   Procedure: LAPAROSCOPIC CHOLECYSTECTOMY;  Surgeon: Jamesetta So, MD;  Location: [facility];  Service: General;  Laterality: N/A;  . COLONOSCOPY WITH PROPOFOL N/A 10/02/2016   Procedure: COLONOSCOPY WITH PROPOFOL;  Surgeon: Danie Binder, MD;  [facility];  Service: Endoscopy;  Laterality: N/A;  145 - pt knows to arrive at 11:15 per office  . ESOPHAGOGASTRODUODENOSCOPY (EGD) WITH PROPOFOL N/A 10/02/2016   Procedure: ESOPHAGOGASTRODUODENOSCOPY (EGD) WITH PROPOFOL;  Surgeon: Danie Binder, MD;  [facility];  Service: Endoscopy;  Laterality: N/A;  . EYE SURGERY    . PARS PLANA VITRECTOMY Left 11/24/2014   Procedure: PARS PLANA VITRECTOMY WITH 25 GAUGE;  Surgeon: Hurman Horn, MD;  Location: [facility];  Service: Ophthalmology;  Laterality: Left;  . PERIPHERAL VASCULAR CATHETERIZATION N/A 04/28/2015   Procedure: Bilateral Upper Extremity Venography;  Surgeon: Conrad Lehighton, MD;  Location: [facility];  Service: Cardiovascular;  Laterality: N/A;  . PHOTOCOAGULATION WITH LASER Left 11/24/2014   Procedure: PHOTOCOAGULATION WITH LASER;  Surgeon: Hurman Horn,  MD;  Location: [facility];  Service: Ophthalmology;  Laterality: Left;  with insertion of silicone oil  . SAVORY DILATION N/A 10/02/2016   Procedure: SAVORY DILATION;  Surgeon: Danie Binder, MD;  [facility];  Service: Endoscopy;  Laterality: N/A;  . TUBAL LIGATION      Current Outpatient Prescriptions  Medication Sig Dispense Refill  . acetaminophen (TYLENOL) 325 MG tablet Take 650 mg by mouth every 6 (six) hours as needed for  mild pain.    Marland Kitchen aspirin 81 MG tablet Take 81 mg by mouth daily.    . blood glucose meter kit and supplies KIT Podigy Voice GLucometer - patient is visually impaired and needs meter that reads results.  Use to check BG up to QID.  Dx: E11.319 Z79.4 1 each 0  . blood glucose meter kit and supplies KIT Dispense based on patient and insurance preference. Use up to four times daily as directed. (FOR ICD-9 250.00, 250.01). 1 each 0  . Blood Glucose Monitoring Suppl (PRODIGY VOICE BLOOD GLUCOSE) w/Device KIT 1 Device by Does not apply route 2 (two) times daily. 1 kit 0  . carvedilol (COREG) 12.5 MG tablet Take 1 tablet (12.5 mg total) by mouth 2 (two) times daily. 180 tablet 1  . cholecalciferol (VITAMIN D) 1000 units tablet Take 1,000 Units by mouth daily.     . citalopram (CELEXA) 20 MG tablet TAKE ONE TABLET BY MOUTH DAILY. 30 tablet 2  . dicyclomine (BENTYL) 10 MG capsule Take 1 capsule (10 mg total) by mouth 4 (four) times daily -  before meals and at bedtime. As needed for diarrhea. Hold for constipation. 120 capsule 0  . fluticasone (FLONASE) 50 MCG/ACT nasal spray Place 2 sprays into both nostrils daily as needed for allergies. 16 g 11  . gabapentin (NEURONTIN) 300 MG capsule Take 100 mg by mouth 2 (two) times daily.     Marland Kitchen glucose blood (PRODIGY NO CODING BLOOD GLUC) test strip Test twice daily 100 each 1  . hydrALAZINE (APRESOLINE) 25 MG tablet Take 1 tablet (25 mg total) by mouth 3 (three) times daily. (Patient taking differently: Take 25 mg by mouth 2 (two) times daily. ) 90 tablet 1  . metroNIDAZOLE (FLAGYL) 500 MG tablet Take 1 tablet (500 mg total) by mouth 2 (two) times daily. 28 tablet 0  . PRODIGY TWIST TOP LANCETS 28G MISC USE TO CHECK BLOOD SUGAR UP TO FOUR TIMES DAILY. 100 each 1  . PROTONIX 40 MG tablet TAKE 1 BY MOUTH DAILY 90 tablet 3  . sevelamer carbonate (RENVELA) 800 MG tablet Take 2,400 mg by mouth 3 (three) times daily with meals. And with snacks.    . traMADol (ULTRAM) 50 MG  tablet Take 1 tablet (50 mg total) by mouth every 6 (six) hours as needed. 8 tablet 0  . traZODone (DESYREL) 50 MG tablet Take 0.5-1 tablets (25-50 mg total) by mouth at bedtime as needed for sleep. 30 tablet 3   No current facility-administered medications for this visit.    Allergies:  Bactrim [sulfamethoxazole-trimethoprim] and Prednisone   Social History: The patient  reports that she has been smoking Cigarettes.  She has a 21.00 pack-year smoking history. She has never used smokeless tobacco. She reports that she does not drink alcohol or use drugs.   ROS:  Please see the history of present illness. Otherwise, complete review of systems is positive for visual impairment.  All other systems are reviewed and negative.   Physical Exam: VS:  BP (!) 156/82   Pulse (!) 56   Ht _0  (1.6 m)   Wt 154 lb (69.9 kg)   LMP 05/05/2015   SpO2 95%   BMI 27.28 kg/m , BMI Body mass index is 27.28 kg/m.  Wt Readings from Last 3 Encounters:  01/28/17 154 lb (69.9 kg)  01/14/17 155 lb (70.3 kg)  01/07/17 161 lb (73 kg)    General: Chronically ill-appearing woman, no distress. HEENT: Conjunctiva and lids normal, oropharynx clear with poor dentition. Neck: Supple, no elevated JVP or carotid bruits, no thyromegaly. Lungs: Clear to auscultation, nonlabored breathing at rest. Cardiac: Regular rate and rhythm, no S3, soft systolic murmur, no pericardial rub. Abdomen: Protuberant with ascites, bowel sounds present. Extremities: Mild ankle edema, distal pulses 2+. Skin: Warm and dry. Musculoskeletal: No kyphosis. Neuropsychiatric: Alert and oriented x3, affect grossly appropriate.  ECG: I personally reviewed the tracing from 12/19/2016 which showed sinus rhythm with rightward axis and decreased R wave progression.  Recent Labwork: 12/19/2016: ALT 15; AST 24; BUN 39; Creatinine, Ser 5.70; Hemoglobin 12.6; Magnesium 1.8; Platelets 222; Potassium 4.2; Sodium 138   Other Studies Reviewed  Today:  Echocardiogram 09/06/2016: Study Conclusions  - Left ventricle: The cavity size was normal. Wall thickness was at   the upper limits of normal. Systolic function was normal. The   estimated ejection fraction was in the range of 55% to 60%. Wall   motion was normal; there were no regional wall motion   abnormalities. Features are consistent with a pseudonormal left   ventricular filling pattern, with concomitant abnormal relaxation   and increased filling pressure (grade 2 diastolic dysfunction). - Aortic valve: Mildly calcified annulus. Trileaflet; mildly   calcified leaflets. There was trivial regurgitation. - Mitral valve: There was mild regurgitation. - Left atrium: The atrium was moderately dilated. - Right ventricle: The cavity size was mildly dilated. Systolic   function was low normal. - Right atrium: Central venous pressure (est): 15 mm Hg. - Atrial septum: No defect or patent foramen ovale was identified. - Tricuspid valve: There was moderate regurgitation. - Pulmonic valve: There was mild regurgitation. - Pulmonary arteries: Systolic pressure was moderately increased.   PA peak pressure: 54 mm Hg (S). - Pericardium, extracardiac: There was no pericardial effusion.  Impressions:  - Upper normal LV wall thickness with LVEF 55-60% and grade 2   diastolic dysfunction. Moderate left atrial enlargement. Mild   mitral regurgitation. Sclerotic aortic valve with trivial aortic   regurgitation. Mildly dilated right ventricle with low normal   contraction. Moderate tricuspid regurgitation with evidence of   moderate pulmonary hypertension, PASP estimated 54 mmHg.  Assessment and Plan:  1. Moderate diastolic dysfunction with normal LVEF based on recent echocardiogram. Also moderate pulmonary hypertension. Suspect that main avenue for fluid control is going to be through hemodialysis. She is not on a diuretic, but I am uncertain as to whether this would provide much benefit  with her ESRD. Continue Coreg and hydralazine, aim for blood pressure control and fluid management per hemodialysis.  2. Abdominal ascites, multifactorial. She had a large-volume paracentesis done back in May, will likely need recurring procedures and follow-up with gastroenterology. Last albumin was mildly low.  3. ESRD on hemodialysis, follows with Dr. Lowanda Foster.  4. Essential hypertension, no changes made present regimen.  Current medicines were reviewed with the patient today.  Disposition: Follow-up in 6 months.  Signed, Satira Sark, MD, [facility] 01/28/2017 10:37 AM    [facility] at  [address], [place], [place] [address] Phone: [phone]; Fax: [phone]

## 2017-01-25 ENCOUNTER — Telehealth: Payer: Self-pay | Admitting: *Deleted

## 2017-01-25 NOTE — Telephone Encounter (Signed)
Informed patient of trichomonas infection and prescription for flagyl sent to pharmacy. States she does not use [facility] so prescription called to [facility] in [place].

## 2017-01-26 DIAGNOSIS — N250 Renal osteodystrophy: Secondary | ICD-10-CM | POA: Diagnosis not present

## 2017-01-26 DIAGNOSIS — N186 End stage renal disease: Secondary | ICD-10-CM | POA: Diagnosis not present

## 2017-01-26 DIAGNOSIS — D509 Iron deficiency anemia, unspecified: Secondary | ICD-10-CM | POA: Diagnosis not present

## 2017-01-26 DIAGNOSIS — D631 Anemia in chronic kidney disease: Secondary | ICD-10-CM | POA: Diagnosis not present

## 2017-01-26 DIAGNOSIS — Z992 Dependence on renal dialysis: Secondary | ICD-10-CM | POA: Diagnosis not present

## 2017-01-28 ENCOUNTER — Ambulatory Visit (INDEPENDENT_AMBULATORY_CARE_PROVIDER_SITE_OTHER): Payer: Medicare Other | Admitting: Cardiology

## 2017-01-28 ENCOUNTER — Encounter: Payer: Self-pay | Admitting: Cardiology

## 2017-01-28 VITALS — BP 156/82 | HR 56 | Ht 63.0 in | Wt 154.0 lb

## 2017-01-28 DIAGNOSIS — I519 Heart disease, unspecified: Secondary | ICD-10-CM

## 2017-01-28 DIAGNOSIS — R188 Other ascites: Secondary | ICD-10-CM | POA: Diagnosis not present

## 2017-01-28 DIAGNOSIS — Z992 Dependence on renal dialysis: Secondary | ICD-10-CM | POA: Diagnosis not present

## 2017-01-28 DIAGNOSIS — I10 Essential (primary) hypertension: Secondary | ICD-10-CM

## 2017-01-28 DIAGNOSIS — N186 End stage renal disease: Secondary | ICD-10-CM | POA: Diagnosis not present

## 2017-01-28 DIAGNOSIS — I5189 Other ill-defined heart diseases: Secondary | ICD-10-CM

## 2017-01-28 NOTE — Patient Instructions (Signed)

## 2017-01-29 DIAGNOSIS — N186 End stage renal disease: Secondary | ICD-10-CM | POA: Diagnosis not present

## 2017-01-29 DIAGNOSIS — D509 Iron deficiency anemia, unspecified: Secondary | ICD-10-CM | POA: Diagnosis not present

## 2017-01-29 DIAGNOSIS — D631 Anemia in chronic kidney disease: Secondary | ICD-10-CM | POA: Diagnosis not present

## 2017-01-29 DIAGNOSIS — Z992 Dependence on renal dialysis: Secondary | ICD-10-CM | POA: Diagnosis not present

## 2017-01-29 DIAGNOSIS — N250 Renal osteodystrophy: Secondary | ICD-10-CM | POA: Diagnosis not present

## 2017-01-31 ENCOUNTER — Ambulatory Visit: Payer: Medicare Other | Admitting: Nurse Practitioner

## 2017-02-02 DIAGNOSIS — D631 Anemia in chronic kidney disease: Secondary | ICD-10-CM | POA: Diagnosis not present

## 2017-02-02 DIAGNOSIS — N250 Renal osteodystrophy: Secondary | ICD-10-CM | POA: Diagnosis not present

## 2017-02-02 DIAGNOSIS — D509 Iron deficiency anemia, unspecified: Secondary | ICD-10-CM | POA: Diagnosis not present

## 2017-02-02 DIAGNOSIS — N186 End stage renal disease: Secondary | ICD-10-CM | POA: Diagnosis not present

## 2017-02-02 DIAGNOSIS — Z992 Dependence on renal dialysis: Secondary | ICD-10-CM | POA: Diagnosis not present

## 2017-02-05 ENCOUNTER — Ambulatory Visit: Payer: Self-pay | Admitting: Gastroenterology

## 2017-02-05 DIAGNOSIS — N186 End stage renal disease: Secondary | ICD-10-CM | POA: Diagnosis not present

## 2017-02-05 DIAGNOSIS — Z992 Dependence on renal dialysis: Secondary | ICD-10-CM | POA: Diagnosis not present

## 2017-02-05 DIAGNOSIS — N250 Renal osteodystrophy: Secondary | ICD-10-CM | POA: Diagnosis not present

## 2017-02-05 DIAGNOSIS — D509 Iron deficiency anemia, unspecified: Secondary | ICD-10-CM | POA: Diagnosis not present

## 2017-02-05 DIAGNOSIS — D631 Anemia in chronic kidney disease: Secondary | ICD-10-CM | POA: Diagnosis not present

## 2017-02-07 DIAGNOSIS — Z992 Dependence on renal dialysis: Secondary | ICD-10-CM | POA: Diagnosis not present

## 2017-02-07 DIAGNOSIS — D509 Iron deficiency anemia, unspecified: Secondary | ICD-10-CM | POA: Diagnosis not present

## 2017-02-07 DIAGNOSIS — N186 End stage renal disease: Secondary | ICD-10-CM | POA: Diagnosis not present

## 2017-02-07 DIAGNOSIS — D631 Anemia in chronic kidney disease: Secondary | ICD-10-CM | POA: Diagnosis not present

## 2017-02-07 DIAGNOSIS — N250 Renal osteodystrophy: Secondary | ICD-10-CM | POA: Diagnosis not present

## 2017-02-08 ENCOUNTER — Other Ambulatory Visit: Payer: Self-pay | Admitting: Family Medicine

## 2017-02-08 ENCOUNTER — Other Ambulatory Visit: Payer: Self-pay | Admitting: Gastroenterology

## 2017-02-09 DIAGNOSIS — D631 Anemia in chronic kidney disease: Secondary | ICD-10-CM | POA: Diagnosis not present

## 2017-02-09 DIAGNOSIS — D509 Iron deficiency anemia, unspecified: Secondary | ICD-10-CM | POA: Diagnosis not present

## 2017-02-09 DIAGNOSIS — N250 Renal osteodystrophy: Secondary | ICD-10-CM | POA: Diagnosis not present

## 2017-02-09 DIAGNOSIS — Z992 Dependence on renal dialysis: Secondary | ICD-10-CM | POA: Diagnosis not present

## 2017-02-09 DIAGNOSIS — N186 End stage renal disease: Secondary | ICD-10-CM | POA: Diagnosis not present

## 2017-02-12 ENCOUNTER — Telehealth: Payer: Self-pay | Admitting: Gastroenterology

## 2017-02-12 NOTE — Telephone Encounter (Signed)
Recommend patient be evaluated by [facility] 02/14/17 as planned. She had tap in 11/2016 ordered by ED. We have never ordered tap on her before and she needs to be assessed first.

## 2017-02-12 NOTE — Telephone Encounter (Signed)
Pt said she needed fluid drawn off her abdomen today if possible. Please call her at [phone]

## 2017-02-12 NOTE — Telephone Encounter (Signed)
Please advise 

## 2017-02-13 DIAGNOSIS — D509 Iron deficiency anemia, unspecified: Secondary | ICD-10-CM | POA: Diagnosis not present

## 2017-02-13 DIAGNOSIS — D631 Anemia in chronic kidney disease: Secondary | ICD-10-CM | POA: Diagnosis not present

## 2017-02-13 DIAGNOSIS — Z992 Dependence on renal dialysis: Secondary | ICD-10-CM | POA: Diagnosis not present

## 2017-02-13 DIAGNOSIS — N250 Renal osteodystrophy: Secondary | ICD-10-CM | POA: Diagnosis not present

## 2017-02-13 DIAGNOSIS — N186 End stage renal disease: Secondary | ICD-10-CM | POA: Diagnosis not present

## 2017-02-13 NOTE — Telephone Encounter (Signed)
Pt canceled her appointment on 02/14/17 due to having dialysis

## 2017-02-14 ENCOUNTER — Ambulatory Visit: Payer: Medicare Other | Admitting: Gastroenterology

## 2017-02-14 ENCOUNTER — Ambulatory Visit: Payer: Self-pay | Admitting: Gastroenterology

## 2017-02-16 DIAGNOSIS — N250 Renal osteodystrophy: Secondary | ICD-10-CM | POA: Diagnosis not present

## 2017-02-16 DIAGNOSIS — D509 Iron deficiency anemia, unspecified: Secondary | ICD-10-CM | POA: Diagnosis not present

## 2017-02-16 DIAGNOSIS — N186 End stage renal disease: Secondary | ICD-10-CM | POA: Diagnosis not present

## 2017-02-16 DIAGNOSIS — Z992 Dependence on renal dialysis: Secondary | ICD-10-CM | POA: Diagnosis not present

## 2017-02-16 DIAGNOSIS — D631 Anemia in chronic kidney disease: Secondary | ICD-10-CM | POA: Diagnosis not present

## 2017-02-19 DIAGNOSIS — Z992 Dependence on renal dialysis: Secondary | ICD-10-CM | POA: Diagnosis not present

## 2017-02-19 DIAGNOSIS — N186 End stage renal disease: Secondary | ICD-10-CM | POA: Diagnosis not present

## 2017-02-19 DIAGNOSIS — D509 Iron deficiency anemia, unspecified: Secondary | ICD-10-CM | POA: Diagnosis not present

## 2017-02-19 DIAGNOSIS — N250 Renal osteodystrophy: Secondary | ICD-10-CM | POA: Diagnosis not present

## 2017-02-19 DIAGNOSIS — D631 Anemia in chronic kidney disease: Secondary | ICD-10-CM | POA: Diagnosis not present

## 2017-02-21 DIAGNOSIS — Z992 Dependence on renal dialysis: Secondary | ICD-10-CM | POA: Diagnosis not present

## 2017-02-21 DIAGNOSIS — D631 Anemia in chronic kidney disease: Secondary | ICD-10-CM | POA: Diagnosis not present

## 2017-02-21 DIAGNOSIS — N186 End stage renal disease: Secondary | ICD-10-CM | POA: Diagnosis not present

## 2017-02-21 DIAGNOSIS — N250 Renal osteodystrophy: Secondary | ICD-10-CM | POA: Diagnosis not present

## 2017-02-21 DIAGNOSIS — D509 Iron deficiency anemia, unspecified: Secondary | ICD-10-CM | POA: Diagnosis not present

## 2017-02-22 NOTE — Telephone Encounter (Signed)
Patient called and scheduled her abd u/s for 7/30 at 8:45 am  Pt is aware of appt.

## 2017-02-23 DIAGNOSIS — N250 Renal osteodystrophy: Secondary | ICD-10-CM | POA: Diagnosis not present

## 2017-02-23 DIAGNOSIS — D509 Iron deficiency anemia, unspecified: Secondary | ICD-10-CM | POA: Diagnosis not present

## 2017-02-23 DIAGNOSIS — N186 End stage renal disease: Secondary | ICD-10-CM | POA: Diagnosis not present

## 2017-02-23 DIAGNOSIS — D631 Anemia in chronic kidney disease: Secondary | ICD-10-CM | POA: Diagnosis not present

## 2017-02-23 DIAGNOSIS — Z992 Dependence on renal dialysis: Secondary | ICD-10-CM | POA: Diagnosis not present

## 2017-02-25 ENCOUNTER — Ambulatory Visit (HOSPITAL_COMMUNITY): Admission: RE | Admit: 2017-02-25 | Payer: Medicare Other | Source: Ambulatory Visit

## 2017-02-26 DIAGNOSIS — D631 Anemia in chronic kidney disease: Secondary | ICD-10-CM | POA: Diagnosis not present

## 2017-02-26 DIAGNOSIS — N186 End stage renal disease: Secondary | ICD-10-CM | POA: Diagnosis not present

## 2017-02-26 DIAGNOSIS — D509 Iron deficiency anemia, unspecified: Secondary | ICD-10-CM | POA: Diagnosis not present

## 2017-02-26 DIAGNOSIS — N250 Renal osteodystrophy: Secondary | ICD-10-CM | POA: Diagnosis not present

## 2017-02-26 DIAGNOSIS — Z992 Dependence on renal dialysis: Secondary | ICD-10-CM | POA: Diagnosis not present

## 2017-02-27 ENCOUNTER — Ambulatory Visit (HOSPITAL_COMMUNITY): Admission: RE | Admit: 2017-02-27 | Payer: Medicare Other | Source: Ambulatory Visit

## 2017-02-28 ENCOUNTER — Emergency Department (HOSPITAL_COMMUNITY)
Admission: EM | Admit: 2017-02-28 | Discharge: 2017-02-28 | Disposition: A | Discharge status: Home Health | Payer: Medicare Other | Attending: Emergency Medicine | Admitting: Emergency Medicine

## 2017-02-28 ENCOUNTER — Encounter (HOSPITAL_COMMUNITY): Payer: Self-pay | Admitting: *Deleted

## 2017-02-28 DIAGNOSIS — Z7982 Long term (current) use of aspirin: Secondary | ICD-10-CM | POA: Insufficient documentation

## 2017-02-28 DIAGNOSIS — I132 Hypertensive heart and chronic kidney disease with heart failure and with stage 5 chronic kidney disease, or end stage renal disease: Secondary | ICD-10-CM | POA: Insufficient documentation

## 2017-02-28 DIAGNOSIS — J45909 Unspecified asthma, uncomplicated: Secondary | ICD-10-CM | POA: Insufficient documentation

## 2017-02-28 DIAGNOSIS — F17210 Nicotine dependence, cigarettes, uncomplicated: Secondary | ICD-10-CM | POA: Diagnosis not present

## 2017-02-28 DIAGNOSIS — N186 End stage renal disease: Secondary | ICD-10-CM | POA: Diagnosis not present

## 2017-02-28 DIAGNOSIS — I5032 Chronic diastolic (congestive) heart failure: Secondary | ICD-10-CM | POA: Insufficient documentation

## 2017-02-28 DIAGNOSIS — E1165 Type 2 diabetes mellitus with hyperglycemia: Secondary | ICD-10-CM | POA: Diagnosis not present

## 2017-02-28 DIAGNOSIS — E1122 Type 2 diabetes mellitus with diabetic chronic kidney disease: Secondary | ICD-10-CM | POA: Diagnosis not present

## 2017-02-28 DIAGNOSIS — L0501 Pilonidal cyst with abscess: Secondary | ICD-10-CM | POA: Insufficient documentation

## 2017-02-28 DIAGNOSIS — R52 Pain, unspecified: Secondary | ICD-10-CM | POA: Diagnosis present

## 2017-02-28 DIAGNOSIS — Z79899 Other long term (current) drug therapy: Secondary | ICD-10-CM | POA: Insufficient documentation

## 2017-02-28 DIAGNOSIS — Z992 Dependence on renal dialysis: Secondary | ICD-10-CM | POA: Diagnosis not present

## 2017-02-28 LAB — I-STAT CHEM 8, ED
BUN: 42 mg/dL — ABNORMAL HIGH (ref 6–20)
CALCIUM ION: 1.07 mmol/L — AB (ref 1.15–1.40)
CHLORIDE: 95 mmol/L — AB (ref 101–111)
Creatinine, Ser: 4.7 mg/dL — ABNORMAL HIGH (ref 0.44–1.00)
Glucose, Bld: 211 mg/dL — ABNORMAL HIGH (ref 65–99)
HEMATOCRIT: 36 % (ref 36.0–46.0)
Hemoglobin: 12.2 g/dL (ref 12.0–15.0)
Potassium: 4.7 mmol/L (ref 3.5–5.1)
SODIUM: 132 mmol/L — AB (ref 135–145)
TCO2: 25 mmol/L (ref 0–100)

## 2017-02-28 MED ORDER — LIDOCAINE-EPINEPHRINE (PF) 2 %-1:200000 IJ SOLN
2.0000 mL | Freq: Once | INTRAMUSCULAR | Status: DC
  Filled 2017-02-28: qty 20

## 2017-02-28 MED ORDER — HYDROCODONE-ACETAMINOPHEN 5-325 MG PO TABS
1.0000 | ORAL_TABLET | Freq: Once | ORAL | Status: AC
  Administered 2017-02-28: 1 via ORAL
  Filled 2017-02-28: qty 1

## 2017-02-28 MED ORDER — HYDROCODONE-ACETAMINOPHEN 5-325 MG PO TABS: 1.0000 | ORAL_TABLET | Freq: Four times a day (QID) | ORAL | 0 refills | Status: DC | PRN

## 2017-02-28 MED ORDER — CEPHALEXIN 500 MG PO CAPS: ORAL_CAPSULE | ORAL | 0 refills | Status: DC

## 2017-02-28 MED ORDER — CEPHALEXIN 500 MG PO CAPS
500.0000 mg | ORAL_CAPSULE | Freq: Once | ORAL | Status: AC
  Administered 2017-02-28: 500 mg via ORAL
  Filled 2017-02-28: qty 1

## 2017-02-28 NOTE — ED Triage Notes (Signed)
BOIL ON LEFT BUTTOCK

## 2017-02-28 NOTE — ED Provider Notes (Addendum)
[facility] DEPT Provider Note   CSN: [id number] Arrival date & time: 02/28/17  1351     History   Chief Complaint Chief Complaint  Patient presents with  . Abscess    HPI Kara Knapp is a 46 y.o. female.  HPI Complains of pain at left buttock for for 5 days with swollen area(points to pilonidal area) anus worse with pressing on the area. Not made better by anything. No treatment prior to coming here She denies pain with bowel movements denies abdominal pain denies other associated symptoms. Denies fever denies shortness of breath. Last hemodialysis session 2 days ago. She did not go today because she felt that she could not sit for dialysis. Denies shortness of breath. No other associated symptoms Past Medical History:  Diagnosis Date  . Anemia of chronic disease   . Asthma   . Blind left eye   . Bronchitis   . Cataract   . Cholecystitis, acute 05/26/2013   Status post cholecystectomy  . Depression   . Diabetic foot ulcer ([place]) 03/01/2015  . Diastolic heart failure ([place])   . ESRD on hemodialysis ([place])    Started diaylsis 12/29/15  . Essential hypertension   . Fibroids   . Glaucoma   . History of blood transfusion   . History of pneumonia   . Hyperlipidemia   . Insulin-dependent diabetes mellitus with retinopathy ([place])   . Neuropathy   . Osteomyelitis ([place])    Toe on left foot    Patient Active Problem List   Diagnosis Date Noted  . Encounter for well woman exam with routine gynecological exam 01/14/2017  . Ascites 12/21/2016  . Constipation 11/08/2016  . Fatty liver 11/08/2016  . Diarrhea   . Rectal bleeding 09/07/2016  . Legally blind 09/06/2016  . Heme positive stool 05/04/2016  . Hepatomegaly 05/04/2016  . Abnormal LFTs 05/04/2016  . Chronic diarrhea 05/04/2016  . Esophageal dysphagia 05/04/2016  . Anemia 05/04/2016  . Mood disorder ([place]) 08/12/2015  . Fibroid, uterine 01/24/2015  . Iron deficiency anemia due to chronic blood loss 01/24/2015  .  Menorrhagia with irregular cycle 01/24/2015  . Chronic diastolic CHF (congestive heart failure) ([place]) 01/21/2015  . Insulin-dependent diabetes mellitus with retinopathy ([place]) 01/21/2015  . Diabetic neuropathy ([place]) 01/21/2015  . ESRD on dialysis ([place]) 01/21/2015  . Anemia of chronic disease 01/21/2015  . Tobacco abuse 01/21/2015  . Cholecystitis, acute 05/26/2013  . Essential hypertension, benign 05/25/2013    Past Surgical History:  Procedure Laterality Date  . A/V SHUNTOGRAM N/A 10/25/2016   Procedure: A/V Shuntogram - Right Arm;  Surgeon: Waynetta Sandy, MD;  Location: [facility];  Service: Cardiovascular;  Laterality: N/A;  . AV FISTULA PLACEMENT Right 10/17/2015   Procedure: INSERTION OF ARTERIOVENOUS GORE-TEX GRAFT RIGHT UPPER ARM WITH ACUSEAL;  Surgeon: Conrad [place], MD;  Location: [facility];  Service: Vascular;  Laterality: Right;  . CATARACT EXTRACTION W/ INTRAOCULAR LENS IMPLANT Bilateral   . CESAREAN SECTION    . CHOLECYSTECTOMY N/A 05/25/2013   Procedure: LAPAROSCOPIC CHOLECYSTECTOMY;  Surgeon: Jamesetta So, MD;  Location: [facility];  Service: General;  Laterality: N/A;  . COLONOSCOPY WITH PROPOFOL N/A 10/02/2016   Procedure: COLONOSCOPY WITH PROPOFOL;  Surgeon: Danie Binder, MD;  [facility];  Service: Endoscopy;  Laterality: N/A;  145 - pt knows to arrive at 11:15 per office  . ESOPHAGOGASTRODUODENOSCOPY (EGD) WITH PROPOFOL N/A 10/02/2016   Procedure: ESOPHAGOGASTRODUODENOSCOPY (EGD) WITH PROPOFOL;  Surgeon: Danie Binder,  MD;  [facility];  Service: Endoscopy;  Laterality: N/A;  . EYE SURGERY    . PARS PLANA VITRECTOMY Left 11/24/2014   Procedure: PARS PLANA VITRECTOMY WITH 25 GAUGE;  Surgeon: Hurman Horn, MD;  Location: [facility];  Service: Ophthalmology;  Laterality: Left;  . PERIPHERAL VASCULAR CATHETERIZATION N/A 04/28/2015   Procedure: Bilateral Upper Extremity Venography;  Surgeon: Conrad [place], MD;  Location: [facility];   Service: Cardiovascular;  Laterality: N/A;  . PHOTOCOAGULATION WITH LASER Left 11/24/2014   Procedure: PHOTOCOAGULATION WITH LASER;  Surgeon: Hurman Horn, MD;  Location: [facility];  Service: Ophthalmology;  Laterality: Left;  with insertion of silicone oil  . SAVORY DILATION N/A 10/02/2016   Procedure: SAVORY DILATION;  Surgeon: Danie Binder, MD;  [facility];  Service: Endoscopy;  Laterality: N/A;  . TUBAL LIGATION      OB History    Gravida Para Term Preterm AB Living   5 5           SAB TAB Ectopic Multiple Live Births                   Home Medications    Prior to Admission medications   Medication Sig Start Date End Date Taking? Authorizing Provider  acetaminophen (TYLENOL) 325 MG tablet Take 650 mg by mouth every 6 (six) hours as needed for mild pain.   Yes [provider]  aspirin 81 MG tablet Take 81 mg by mouth daily.   Yes [provider]  carvedilol (COREG) 12.5 MG tablet TAKE ONE TABLET BY MOUTH TWICE DAILY 02/08/17  Yes Timmothy Euler, MD  cholecalciferol (VITAMIN D) 1000 units tablet Take 1,000 Units by mouth daily.    Yes [provider]  citalopram (CELEXA) 20 MG tablet TAKE ONE TABLET BY MOUTH DAILY. 01/21/17  Yes Timmothy Euler, MD  dicyclomine (BENTYL) 10 MG capsule TAKE ONE CAPSULE BY MOUTH FOUR TIMES DAILY BEFORE MEALS AND AT BEDTIME AS NEEDED FOR DIARRHEA, HOLD FOR CONSTIPATION. Patient taking differently: TAKE ONE CAPSULE BY MOUTH TWICE A DAY. 02/08/17  Yes Mahala Menghini, PA-C  fluticasone (FLONASE) 50 MCG/ACT nasal spray Place 2 sprays into both nostrils daily as needed for allergies. 09/05/16  Yes Dettinger, Fransisca Kaufmann, MD  gabapentin (NEURONTIN) 300 MG capsule Take 100 mg by mouth 2 (two) times daily.    Yes [provider]  glucose blood (PRODIGY NO CODING BLOOD GLUC) test strip Test twice daily 12/10/16  Yes Timmothy Euler, MD  hydrALAZINE (APRESOLINE) 25 MG tablet Take 1 tablet (25 mg total) by mouth 3  (three) times daily. Patient taking differently: Take 25 mg by mouth 2 (two) times daily.  07/08/15  Yes Dettinger, Fransisca Kaufmann, MD  PROTONIX 40 MG tablet TAKE 1 BY MOUTH DAILY 01/18/17  Yes Carlis Stable, NP  sevelamer carbonate (RENVELA) 800 MG tablet Take 2,400 mg by mouth 3 (three) times daily with meals. And with snacks.   Yes [provider]  traZODone (DESYREL) 50 MG tablet Take 0.5-1 tablets (25-50 mg total) by mouth at bedtime as needed for sleep. 01/07/17  Yes Timmothy Euler, MD  traMADol (ULTRAM) 50 MG tablet Take 1 tablet (50 mg total) by mouth every 6 (six) hours as needed. Patient not taking: Reported on 02/28/2017 12/20/16   Varney Biles, MD    Family History Family History  Problem Relation Age of Onset  . COPD Mother   . Cancer  Father   . Lymphoma Father   . Diabetes Sister   . Deep vein thrombosis Sister   . Diabetes Brother   . Hyperlipidemia Brother   . Hypertension Brother   . Mental retardation Sister   . Colon cancer Neg Hx   . Liver disease Neg Hx     Social History Social History  Substance Use Topics  . Smoking status: Current Every Day Smoker    Packs/day: 1.00    Years: 21.00    Types: Cigarettes  . Smokeless tobacco: Never Used     Comment: one pack daily  . Alcohol use No     Allergies   Bactrim [sulfamethoxazole-trimethoprim] and Prednisone   Review of Systems Review of Systems  Constitutional: Negative.   HENT: Negative.   Respiratory: Negative.   Cardiovascular: Negative.   Gastrointestinal: Negative.   Musculoskeletal: Negative.   Skin: Positive for wound.       Swollen painful area left buttock  Allergic/Immunologic: Positive for immunocompromised state.       Hemodialysis patient  Neurological: Negative.   Psychiatric/Behavioral: Negative.   All other systems reviewed and are negative.    Physical Exam Updated Vital Signs BP (!) 128/58 (BP Location: Left Arm)   Pulse (!) 56   Temp (!) 97.5 F (36.4 C)  (Temporal)   Resp 20   Ht 5' 3"  (1.6 m)   Wt 65.8 kg (145 lb)   LMP 05/05/2015   SpO2 98%   BMI 25.69 kg/m   Physical Exam  Constitutional: She is oriented to person, place, and time.  Chronically ill-appearing not acutely ill-appearing  HENT:  Head: Normocephalic and atraumatic.  Eyes: EOM are normal.  Neck: Normal range of motion.  Cardiovascular: Intact distal pulses.   Pulmonary/Chest: Effort normal and breath sounds normal.  Abdominal: Soft. There is tenderness.  Musculoskeletal: Normal range of motion.  Dialysis graft right upper arm with good thrill  Neurological: She is oriented to person, place, and time. No cranial nerve deficit. Coordination normal.  Skin: Capillary refill takes less than 2 seconds.  Fluctuant area pilonidal area slightly to the left of midline. Approximate 3 cm in diameter  Nursing note and vitals reviewed.    ED Treatments / Results  Labs (all labs ordered are listed, but only abnormal results are displayed) Labs Reviewed - No data to display  EKG  EKG Interpretation None       Radiology No results found.  Procedures .Marland KitchenIncision and Drainage Date/Time: 02/28/2017 4:00 PM Performed by: Orlie Dakin Authorized by: Orlie Dakin   Consent:    Consent obtained:  Verbal   Consent given by:  Patient   Risks discussed:  Bleeding, incomplete drainage, infection and pain   Alternatives discussed:  Observation and delayed treatment Location:    Type:  Abscess   Size:  3 cm pilonidal area Pre-procedure details:    Skin preparation:  Betadine Anesthesia (see MAR for exact dosages):    Anesthesia method:  Local infiltration   Local anesthetic:  Lidocaine 2% WITH epi Procedure type:    Complexity:  Complex Procedure details:    Incision types:  Single straight   Incision depth:  Muscle   Scalpel blade:  11   Wound management:  Probed and deloculated, irrigated with saline and extensive cleaning   Drainage:  Purulent   Drainage  amount:  Moderate   Wound treatment:  Wound left open Post-procedure details:    Patient tolerance of procedure:  Tolerated well, no immediate complications   (  including critical care time)  Medications Ordered in ED Medications  lidocaine-EPINEPHrine (XYLOCAINE W/EPI) 2 %-1:200000 (PF) injection 2 mL (not administered)    Results for orders placed or performed during the hospital encounter of 02/28/17  I-stat chem 8, ed  Result Value Ref Range   Sodium 132 (L) 135 - 145 mmol/L   Potassium 4.7 3.5 - 5.1 mmol/L   Chloride 95 (L) 101 - 111 mmol/L   BUN 42 (H) 6 - 20 mg/dL   Creatinine, Ser 4.70 (H) 0.44 - 1.00 mg/dL   Glucose, Bld 211 (H) 65 - 99 mg/dL   Calcium, Ion 1.07 (L) 1.15 - 1.40 mmol/L   TCO2 25 0 - 100 mmol/L   Hemoglobin 12.2 12.0 - 15.0 g/dL   HCT 36.0 36.0 - 46.0 %   No results found. Initial Impression / Assessment and Plan / ED Course  I have reviewed the triage vital signs and the nursing notes.  Pertinent labs & imaging results that were available during my care of the patient were reviewed by me and considered in my medical decision making (see chart for details).     Plan prescription Norco, Keflex.[place] Controlled Substance reporting System queried She will call hemodialysis to reschedule dialysis tomorrow. Wound check 2 days. Warm soaks  Results for orders placed or performed during the hospital encounter of 02/28/17  I-stat chem 8, ed  Result Value Ref Range   Sodium 132 (L) 135 - 145 mmol/L   Potassium 4.7 3.5 - 5.1 mmol/L   Chloride 95 (L) 101 - 111 mmol/L   BUN 42 (H) 6 - 20 mg/dL   Creatinine, Ser 4.70 (H) 0.44 - 1.00 mg/dL   Glucose, Bld 211 (H) 65 - 99 mg/dL   Calcium, Ion 1.07 (L) 1.15 - 1.40 mmol/L   TCO2 25 0 - 100 mmol/L   Hemoglobin 12.2 12.0 - 15.0 g/dL   HCT 36.0 36.0 - 46.0 %   No results found.  Final Clinical Impressions(s) / ED Diagnoses  Diagnosis #1 pilonidal abscess  #2 hyperglycemia  Final diagnoses:  None     New Prescriptions New Prescriptions   No medications on file     Orlie Dakin, MD 02/28/17 Melissa, MD 02/28/17 1617 4 20 p.m. requesting pain medicine. Norco ordered prior to discharge. Patient not drive home   Orlie Dakin, MD 02/28/17 1622

## 2017-02-28 NOTE — Discharge Instructions (Signed)
Sit in warm bathtub 4 times daily for 30 minutes at a time. Take Tylenol for mild pain or the pain medicine prescribed for bad pain. Don't take Tylenol together with the pain medicine prescribed as the combination can be dangerous to your liver. Your blood sugar was mildly elevated today at 211. Call your hemodialysis center tomorrow morning to get dialysis scheduled for tomorrow. He should get your wound rechecked in 2 days. Go to an urgent care center, your primary care doctor or return here if you're unable to get it checked elsewhere. Return if concern for any reason

## 2017-03-02 DIAGNOSIS — N250 Renal osteodystrophy: Secondary | ICD-10-CM | POA: Diagnosis not present

## 2017-03-02 DIAGNOSIS — D509 Iron deficiency anemia, unspecified: Secondary | ICD-10-CM | POA: Diagnosis not present

## 2017-03-02 DIAGNOSIS — D631 Anemia in chronic kidney disease: Secondary | ICD-10-CM | POA: Diagnosis not present

## 2017-03-02 DIAGNOSIS — N186 End stage renal disease: Secondary | ICD-10-CM | POA: Diagnosis not present

## 2017-03-02 DIAGNOSIS — Z992 Dependence on renal dialysis: Secondary | ICD-10-CM | POA: Diagnosis not present

## 2017-03-04 ENCOUNTER — Ambulatory Visit (INDEPENDENT_AMBULATORY_CARE_PROVIDER_SITE_OTHER): Payer: Medicare Other | Admitting: Family Medicine

## 2017-03-04 ENCOUNTER — Encounter: Payer: Self-pay | Admitting: Family Medicine

## 2017-03-04 VITALS — BP 134/68 | HR 54 | Temp 97.2°F | Ht 63.0 in | Wt 155.0 lb

## 2017-03-04 DIAGNOSIS — L0501 Pilonidal cyst with abscess: Secondary | ICD-10-CM

## 2017-03-04 NOTE — Progress Notes (Signed)
BP 134/68   Pulse (!) 54   Temp (!) 97.2 F (36.2 C) (Oral)   Ht 5' 3"  (1.6 m)   Wt 155 lb (70.3 kg)   LMP 05/05/2015   BMI 27.46 kg/m    Subjective:    Patient ID: Kara Knapp, female    DOB: Oct 07, 1970, 46 y.o.   MRN: [id number]  HPI: ANGINETTE Knapp is a 46 y.o. female presenting on 03/04/2017 for wound check of an I&D of her 3cm pilonidal abscess 5 days ago.She states that it feels better than before the I&D, but still reports sharp pain with sitting. She reports that she was given Norco at that time and has been taking them spread out. She was also prescribed 10 days of Keflex that she will continue to take. Her nurse has been helping her change the dressing twice per day, but she is requesting a wound care nurse to help change her dressing. She reports that she occasionally has "accidents" and does not always clean herself well afterwards. She reports a history of abscesses like this.   Abscess  Pertinent negatives include no abdominal pain, chest pain, chills, congestion, coughing, fever, headaches, nausea, numbness, rash, sore throat, vomiting or weakness.     Relevant past medical, surgical, family and social history reviewed and updated as indicated. Interim medical history since our last visit reviewed. Allergies and medications reviewed and updated.  Review of Systems  Constitutional: Negative for activity change, chills and fever.  HENT: Negative for congestion, rhinorrhea, sinus pressure, sneezing and sore throat.   Respiratory: Negative for cough, shortness of breath and wheezing.   Cardiovascular: Negative for chest pain, palpitations and leg swelling.  Gastrointestinal: Negative for abdominal pain, diarrhea, nausea and vomiting.  Genitourinary: Negative for difficulty urinating, dysuria, frequency, hematuria and urgency.  Skin: Positive for wound (Well healing 3cm open would post I&D in pilonidal area w/ purulent drainage ). Negative for rash.  Neurological:  Negative for dizziness, weakness, numbness and headaches.  Psychiatric/Behavioral: Negative for agitation, behavioral problems and confusion.    Per HPI unless specifically indicated above      Objective:    BP 134/68   Pulse (!) 54   Temp (!) 97.2 F (36.2 C) (Oral)   Ht 5' 3"  (1.6 m)   Wt 155 lb (70.3 kg)   LMP 05/05/2015   BMI 27.46 kg/m   Wt Readings from Last 3 Encounters:  03/04/17 155 lb (70.3 kg)  02/28/17 145 lb (65.8 kg)  01/28/17 154 lb (69.9 kg)    Physical Exam  Constitutional: She is oriented to person, place, and time. She appears well-developed and well-nourished. No distress.  HENT:  Head: Normocephalic and atraumatic.  Right Ear: External ear normal.  Left Ear: External ear normal.  Mouth/Throat: Oropharynx is clear and moist.  Eyes: Pupils are equal, round, and reactive to light. Conjunctivae and EOM are normal. No scleral icterus.  Neck: Normal range of motion. Neck supple. No thyromegaly present.  Cardiovascular: Normal rate, regular rhythm and normal heart sounds.   No murmur heard. Pulmonary/Chest: Effort normal and breath sounds normal. No respiratory distress. She has no wheezes.  Abdominal: Soft. Bowel sounds are normal. She exhibits no distension. There is no tenderness.  Musculoskeletal: Normal range of motion. She exhibits no edema or tenderness.  Neurological: She is alert and oriented to person, place, and time. She has normal reflexes.  Skin: Skin is warm and dry. She is not diaphoretic.  Well healing 3cm open would  post I&D in pilonidal area w/ purulent drainage  Psychiatric: She has a normal mood and affect. Her behavior is normal. Judgment and thought content normal.  Nursing note and vitals reviewed.       Assessment & Plan:   Problem List Items Addressed This Visit    None    Visit Diagnoses    Pilonidal cyst with abscess    -  Primary       Kara Knapp is a 46 y.o. female presenting on 03/04/2017 for wound check of an  I&D of her 3cm pilonidal abscess 5 days ago. On exam she has a well healing 3cm open would post I&D in pilonidal area w/ purulent drainage. Dressed the wound today and instructed pt to keep taking Keflex for 7 days. She may use Norco as needed for pain. Will send referral for home health would care nurse to help ensure dressing is changed every day. Will re-check here in 7 days. Additionally, counseled patient about cleanliness to prevent abscesses in the future.   Follow up plan: Follow up for wound re-check in 7 days.   Patient seen and examined with Lowella Petties medical student. Agree with assessment and plan above, will see patient back in 1 week Caryl Pina, MD [place] 03/04/2017, 2:03 PM

## 2017-03-05 DIAGNOSIS — Z992 Dependence on renal dialysis: Secondary | ICD-10-CM | POA: Diagnosis not present

## 2017-03-05 DIAGNOSIS — D509 Iron deficiency anemia, unspecified: Secondary | ICD-10-CM | POA: Diagnosis not present

## 2017-03-05 DIAGNOSIS — N186 End stage renal disease: Secondary | ICD-10-CM | POA: Diagnosis not present

## 2017-03-05 DIAGNOSIS — N250 Renal osteodystrophy: Secondary | ICD-10-CM | POA: Diagnosis not present

## 2017-03-05 DIAGNOSIS — D631 Anemia in chronic kidney disease: Secondary | ICD-10-CM | POA: Diagnosis not present

## 2017-03-07 DIAGNOSIS — N250 Renal osteodystrophy: Secondary | ICD-10-CM | POA: Diagnosis not present

## 2017-03-07 DIAGNOSIS — D509 Iron deficiency anemia, unspecified: Secondary | ICD-10-CM | POA: Diagnosis not present

## 2017-03-07 DIAGNOSIS — N186 End stage renal disease: Secondary | ICD-10-CM | POA: Diagnosis not present

## 2017-03-07 DIAGNOSIS — Z992 Dependence on renal dialysis: Secondary | ICD-10-CM | POA: Diagnosis not present

## 2017-03-07 DIAGNOSIS — D631 Anemia in chronic kidney disease: Secondary | ICD-10-CM | POA: Diagnosis not present

## 2017-03-09 DIAGNOSIS — D631 Anemia in chronic kidney disease: Secondary | ICD-10-CM | POA: Diagnosis not present

## 2017-03-09 DIAGNOSIS — D509 Iron deficiency anemia, unspecified: Secondary | ICD-10-CM | POA: Diagnosis not present

## 2017-03-09 DIAGNOSIS — N250 Renal osteodystrophy: Secondary | ICD-10-CM | POA: Diagnosis not present

## 2017-03-09 DIAGNOSIS — N186 End stage renal disease: Secondary | ICD-10-CM | POA: Diagnosis not present

## 2017-03-09 DIAGNOSIS — Z992 Dependence on renal dialysis: Secondary | ICD-10-CM | POA: Diagnosis not present

## 2017-03-11 ENCOUNTER — Ambulatory Visit (INDEPENDENT_AMBULATORY_CARE_PROVIDER_SITE_OTHER): Payer: Medicare Other | Admitting: Family Medicine

## 2017-03-11 ENCOUNTER — Encounter: Payer: Self-pay | Admitting: Family Medicine

## 2017-03-11 VITALS — BP 122/72 | HR 59 | Temp 97.3°F | Ht 63.0 in | Wt 150.0 lb

## 2017-03-11 DIAGNOSIS — Z22322 Carrier or suspected carrier of Methicillin resistant Staphylococcus aureus: Secondary | ICD-10-CM

## 2017-03-11 DIAGNOSIS — F39 Unspecified mood [affective] disorder: Secondary | ICD-10-CM | POA: Diagnosis not present

## 2017-03-11 DIAGNOSIS — L0591 Pilonidal cyst without abscess: Secondary | ICD-10-CM

## 2017-03-11 DIAGNOSIS — L608 Other nail disorders: Secondary | ICD-10-CM

## 2017-03-11 MED ORDER — MUPIROCIN 2 % EX OINT: 1.0000 "application " | TOPICAL_OINTMENT | Freq: Two times a day (BID) | CUTANEOUS | 0 refills | Status: DC

## 2017-03-11 NOTE — Progress Notes (Signed)
HPI  Patient presents today here to follow-up for pilonidal cyst from the ER and other requests   pilonidal cyst Doing much better, has finished Keflex. Changing bandage as recommended  Patient complains of frequent boils in infections lately  Patient requests exam glove prescription to help her with wound care of her pilonidal cyst. She needs a handicap placard filled out, she has to use an assistive device and she is legally blind.  Patient with toenail abnormalities, would like to see a podiatrist  She also requests something more for anxiety, she did not make it to her previous psychiatrist appt.   A she also complains of a left lower extremity injury when she stumbled, no complete fall. She does not have any wounds or bleeding. She was immediately able to bear weight  PMH: Smoking status noted ROS: Per HPI  Objective: BP 122/72   Pulse (!) 59   Temp (!) 97.3 F (36.3 C) (Oral)   Ht 5' 3"  (1.6 m)   Wt 150 lb (68 kg)   LMP 05/05/2015   BMI 26.57 kg/m  Gen: NAD, alert, cooperative with exam HEENT: NCAT CV: RRR, good S1/S2, no murmur Resp: CTABL, no wheezes, non-labored Ext: 1+ pitting edema Neuro: Alert and oriented, No gross deficits MSK:  No gross deformity of the left lower extremity, no bruising or trauma, mild tenderness to palpation of the lower left extremity. Skin Patient with approximately 2 cm incision which is mostly healed on the left side of the gluteal cleft to the superior edge, very small amount of yellow discharge  Depression screen [employer] 2/9 03/11/2017 03/04/2017 01/07/2017 11/06/2016 10/15/2016  Decreased Interest 0 1 1 2 2   Down, Depressed, Hopeless 0 1 1 2 2   PHQ - 2 Score 0 2 2 4 4   Altered sleeping - 2 2 2 2   Tired, decreased energy - 1 1 2 2   Change in appetite - 3 3 3 2   Feeling bad or failure about yourself  - 1 1 2 2   Trouble concentrating - 1 1 2 2   Moving slowly or fidgety/restless - 0 0 0 0  Suicidal thoughts - 1 1 1 2   PHQ-9 Score -  11 11 16 16   Difficult doing work/chores - - - Somewhat difficult -  Some recent data might be hidden      Assessment and plan:  # pilonidal cyst Healing as expected No additional antibiotics needed Discussed wound care until it is completely closed given that she has difficulty with incontinence at times.  # MRSA colonization Patient concerned about recurrent boils, recommended mupirocin to the nose twice daily 5 days  # Mood disorder Patient requesting "something for anxiety" She is on citalopram at therapeutic dose, symptoms are intermittently worse. Did not make it to her previous psychiatry appointment Patient will pursue [facility]  # Toenail deformity Left great toe with deformity Diabetes will refer to podiatry  Patient uses a cane to walk, she is also legally blind, handicap placard paperwork filled out today. Leg injury-exam is reassuring, I don't believe there is any risk of broken bone or serious injury.    Orders Placed This Encounter  Procedures  . Ambulatory referral to Podiatry    Referral Priority:   Routine    Referral Type:   Consultation    Referral Reason:   Specialty Services Required    Requested Specialty:   Podiatry    Number of Visits Requested:   1    Meds ordered  this encounter  Medications  . DISCONTD: mupirocin ointment (BACTROBAN) 2 %    Sig: Place 1 application into the nose 2 (two) times daily. For 5 days    Dispense:  22 g    Refill:  0  . mupirocin ointment (BACTROBAN) 2 %    Sig: Place 1 application into the nose 2 (two) times daily. For 5 days    Dispense:  22 g    Refill:  0    Kara Apple, MD [facility] 03/11/2017, 3:41 PM

## 2017-03-11 NOTE — Patient Instructions (Addendum)
Great to see you!  Come back after September 11  We will work on Building surveyor for you

## 2017-03-12 ENCOUNTER — Other Ambulatory Visit: Payer: Self-pay | Admitting: Gastroenterology

## 2017-03-12 ENCOUNTER — Other Ambulatory Visit: Payer: Self-pay | Admitting: Family Medicine

## 2017-03-13 DIAGNOSIS — D631 Anemia in chronic kidney disease: Secondary | ICD-10-CM | POA: Diagnosis not present

## 2017-03-13 DIAGNOSIS — N186 End stage renal disease: Secondary | ICD-10-CM | POA: Diagnosis not present

## 2017-03-13 DIAGNOSIS — Z992 Dependence on renal dialysis: Secondary | ICD-10-CM | POA: Diagnosis not present

## 2017-03-13 DIAGNOSIS — D509 Iron deficiency anemia, unspecified: Secondary | ICD-10-CM | POA: Diagnosis not present

## 2017-03-13 DIAGNOSIS — N250 Renal osteodystrophy: Secondary | ICD-10-CM | POA: Diagnosis not present

## 2017-03-15 ENCOUNTER — Ambulatory Visit (HOSPITAL_COMMUNITY)
Admission: RE | Admit: 2017-03-15 | Discharge: 2017-03-15 | Disposition: A | Discharge status: Home / Self Care | Payer: Medicare Other | Source: Ambulatory Visit | Attending: Gastroenterology | Admitting: Gastroenterology

## 2017-03-15 DIAGNOSIS — R160 Hepatomegaly, not elsewhere classified: Secondary | ICD-10-CM | POA: Diagnosis not present

## 2017-03-15 DIAGNOSIS — R7989 Other specified abnormal findings of blood chemistry: Secondary | ICD-10-CM | POA: Diagnosis not present

## 2017-03-15 DIAGNOSIS — R945 Abnormal results of liver function studies: Secondary | ICD-10-CM

## 2017-03-15 NOTE — Progress Notes (Signed)
REVIEWED. NEXT OPV SEP 2018.

## 2017-03-16 DIAGNOSIS — N250 Renal osteodystrophy: Secondary | ICD-10-CM | POA: Diagnosis not present

## 2017-03-16 DIAGNOSIS — Z992 Dependence on renal dialysis: Secondary | ICD-10-CM | POA: Diagnosis not present

## 2017-03-16 DIAGNOSIS — N186 End stage renal disease: Secondary | ICD-10-CM | POA: Diagnosis not present

## 2017-03-16 DIAGNOSIS — D509 Iron deficiency anemia, unspecified: Secondary | ICD-10-CM | POA: Diagnosis not present

## 2017-03-16 DIAGNOSIS — D631 Anemia in chronic kidney disease: Secondary | ICD-10-CM | POA: Diagnosis not present

## 2017-03-19 DIAGNOSIS — Z992 Dependence on renal dialysis: Secondary | ICD-10-CM | POA: Diagnosis not present

## 2017-03-19 DIAGNOSIS — N186 End stage renal disease: Secondary | ICD-10-CM | POA: Diagnosis not present

## 2017-03-19 DIAGNOSIS — D509 Iron deficiency anemia, unspecified: Secondary | ICD-10-CM | POA: Diagnosis not present

## 2017-03-19 DIAGNOSIS — N250 Renal osteodystrophy: Secondary | ICD-10-CM | POA: Diagnosis not present

## 2017-03-19 DIAGNOSIS — D631 Anemia in chronic kidney disease: Secondary | ICD-10-CM | POA: Diagnosis not present

## 2017-03-19 NOTE — Progress Notes (Signed)
Exam for f/u kidney lesion. None seen now. Mild hepatomegaly with ?fatty liver.  Needs to keep upcoming ov

## 2017-03-20 NOTE — Progress Notes (Signed)
Pt is aware.  

## 2017-03-21 ENCOUNTER — Ambulatory Visit: Payer: Medicare Other | Admitting: Gastroenterology

## 2017-03-25 DIAGNOSIS — N250 Renal osteodystrophy: Secondary | ICD-10-CM | POA: Diagnosis not present

## 2017-03-25 DIAGNOSIS — Z992 Dependence on renal dialysis: Secondary | ICD-10-CM | POA: Diagnosis not present

## 2017-03-25 DIAGNOSIS — N186 End stage renal disease: Secondary | ICD-10-CM | POA: Diagnosis not present

## 2017-03-25 DIAGNOSIS — D509 Iron deficiency anemia, unspecified: Secondary | ICD-10-CM | POA: Diagnosis not present

## 2017-03-25 DIAGNOSIS — D631 Anemia in chronic kidney disease: Secondary | ICD-10-CM | POA: Diagnosis not present

## 2017-03-26 ENCOUNTER — Telehealth: Payer: Self-pay | Admitting: Gastroenterology

## 2017-03-26 DIAGNOSIS — Z992 Dependence on renal dialysis: Secondary | ICD-10-CM | POA: Diagnosis not present

## 2017-03-26 DIAGNOSIS — N186 End stage renal disease: Secondary | ICD-10-CM | POA: Diagnosis not present

## 2017-03-26 DIAGNOSIS — N250 Renal osteodystrophy: Secondary | ICD-10-CM | POA: Diagnosis not present

## 2017-03-26 DIAGNOSIS — D631 Anemia in chronic kidney disease: Secondary | ICD-10-CM | POA: Diagnosis not present

## 2017-03-26 DIAGNOSIS — D509 Iron deficiency anemia, unspecified: Secondary | ICD-10-CM | POA: Diagnosis not present

## 2017-03-26 NOTE — Telephone Encounter (Signed)
Pt called asking to speak with LSL. I told her LSL was with patients and I could take a message. She said that she is still having problems with her gas and "bathroom issues".  I reminded her that she has an appt with [place] on 9/7 but she said that her aide told her to call us and see if she could be seen sooner. I told her there was nothing any sooner and I would have the nurse call. She said she would call back tomorrow and speak with Magda Paganini. [phone]

## 2017-03-26 NOTE — Telephone Encounter (Signed)
PT is at dialysis and will call back tomorrow morning.

## 2017-03-28 DIAGNOSIS — D631 Anemia in chronic kidney disease: Secondary | ICD-10-CM | POA: Diagnosis not present

## 2017-03-28 DIAGNOSIS — N250 Renal osteodystrophy: Secondary | ICD-10-CM | POA: Diagnosis not present

## 2017-03-28 DIAGNOSIS — D509 Iron deficiency anemia, unspecified: Secondary | ICD-10-CM | POA: Diagnosis not present

## 2017-03-28 DIAGNOSIS — Z992 Dependence on renal dialysis: Secondary | ICD-10-CM | POA: Diagnosis not present

## 2017-03-28 DIAGNOSIS — N186 End stage renal disease: Secondary | ICD-10-CM | POA: Diagnosis not present

## 2017-03-30 DIAGNOSIS — Z992 Dependence on renal dialysis: Secondary | ICD-10-CM | POA: Diagnosis not present

## 2017-03-30 DIAGNOSIS — D509 Iron deficiency anemia, unspecified: Secondary | ICD-10-CM | POA: Diagnosis not present

## 2017-03-30 DIAGNOSIS — E8779 Other fluid overload: Secondary | ICD-10-CM | POA: Diagnosis not present

## 2017-03-30 DIAGNOSIS — N186 End stage renal disease: Secondary | ICD-10-CM | POA: Diagnosis not present

## 2017-03-30 DIAGNOSIS — N250 Renal osteodystrophy: Secondary | ICD-10-CM | POA: Diagnosis not present

## 2017-03-30 DIAGNOSIS — D631 Anemia in chronic kidney disease: Secondary | ICD-10-CM | POA: Diagnosis not present

## 2017-04-02 ENCOUNTER — Other Ambulatory Visit: Payer: Self-pay

## 2017-04-02 DIAGNOSIS — R197 Diarrhea, unspecified: Secondary | ICD-10-CM

## 2017-04-02 NOTE — Telephone Encounter (Signed)
Pt said she was on antibiotics x 3 weeks ago for a boil on her bottom. She is aware to come by the office to pick up order and container to do the C-Diff, PCR.

## 2017-04-02 NOTE — Telephone Encounter (Signed)
FYI to Neil Crouch, PA.

## 2017-04-02 NOTE — Telephone Encounter (Addendum)
Patient has not been seen in four months. Needs OV as scheduled. She can take imodium 54m twice daily if needed until her OV.   If she has been on antibiotics in the past 2 months, we should check Cdiff PCR.

## 2017-04-02 NOTE — Telephone Encounter (Signed)
PT said she is having quite a bit of diarrhea now. She missed dialysis today ( but rescheduled for tomorrow) due to diarrhea last night. She is taking the Bentyl about 3 times a day, ( she doesn't eat 3 meals a lot of times). She said she will make it to her appt on Friday 04/05/2017. Please advise!

## 2017-04-03 DIAGNOSIS — Z992 Dependence on renal dialysis: Secondary | ICD-10-CM | POA: Diagnosis not present

## 2017-04-03 DIAGNOSIS — D509 Iron deficiency anemia, unspecified: Secondary | ICD-10-CM | POA: Diagnosis not present

## 2017-04-03 DIAGNOSIS — N250 Renal osteodystrophy: Secondary | ICD-10-CM | POA: Diagnosis not present

## 2017-04-03 DIAGNOSIS — E8779 Other fluid overload: Secondary | ICD-10-CM | POA: Diagnosis not present

## 2017-04-03 DIAGNOSIS — N186 End stage renal disease: Secondary | ICD-10-CM | POA: Diagnosis not present

## 2017-04-03 DIAGNOSIS — D631 Anemia in chronic kidney disease: Secondary | ICD-10-CM | POA: Diagnosis not present

## 2017-04-03 NOTE — Telephone Encounter (Signed)
Pt has picked up the container and will try to do ASAP.

## 2017-04-03 NOTE — Telephone Encounter (Signed)
Noted. Need cdiff asap.

## 2017-04-04 DIAGNOSIS — R197 Diarrhea, unspecified: Secondary | ICD-10-CM | POA: Diagnosis not present

## 2017-04-05 ENCOUNTER — Ambulatory Visit (INDEPENDENT_AMBULATORY_CARE_PROVIDER_SITE_OTHER): Payer: Medicare Other | Admitting: Gastroenterology

## 2017-04-05 ENCOUNTER — Encounter: Payer: Self-pay | Admitting: Gastroenterology

## 2017-04-05 ENCOUNTER — Ambulatory Visit: Payer: Medicare Other | Admitting: Gastroenterology

## 2017-04-05 VITALS — BP 121/64 | HR 56 | Temp 98.1°F | Ht 63.0 in | Wt 149.9 lb

## 2017-04-05 DIAGNOSIS — R197 Diarrhea, unspecified: Secondary | ICD-10-CM

## 2017-04-05 DIAGNOSIS — K760 Fatty (change of) liver, not elsewhere classified: Secondary | ICD-10-CM | POA: Diagnosis not present

## 2017-04-05 LAB — CLOSTRIDIUM DIFFICILE BY PCR: CDIFFPCR: NOT DETECTED

## 2017-04-05 MED ORDER — HYDROCORTISONE 2.5 % RE CREA: 1.0000 "application " | TOPICAL_CREAM | Freq: Two times a day (BID) | RECTAL | 1 refills | Status: DC

## 2017-04-05 NOTE — Progress Notes (Signed)
cc'ed to pcp °

## 2017-04-05 NOTE — Assessment & Plan Note (Signed)
With hepatomegaly, thorough evaluation with serologies. Paracentesis previously but no fluid analysis. Multifactorial etiology in setting of renal disease, possible early cirrhosis. No stigmata of portal hypertension on EGD. Platelet count normal. Albumin has been drifting down, however. Needs to keep appts with dialysis. Patient concerned about cirrhosis: discussed only definitive way would be liver biopsy. Could consider transjugular with wedge measurements but at this point she has other acute health issues we are dealing with. Return to see Dr. Oneida Alar for further discussion of liver evaluation in future. IF any need for recurrent paracentesis, would need full fluid analysis.

## 2017-04-05 NOTE — Progress Notes (Addendum)
REVIEWED-NO ADDITIONAL RECOMMENDATIONS.  Referring Provider: Timmothy Euler, MD Primary Care Physician:  Timmothy Euler, MD Primary GI: Dr. Oneida Alar    Chief Complaint  Patient presents with  . Abdominal Pain  . Diarrhea    HPI:   Kara Knapp is a 46 y.o. female presenting today with a history of fatty liver, elevated LFTs, possible early cirrhosis with suspected portal venous collaterals seen on ultrasound but no splenomegaly, no obvious stigmata of portal hypertension on recent upper endoscopy. On dialysis. When seen at last visit in May 2018 was asked to follow-up with PCP regarding ascites as may not be completely related to liver disease. Also with history of chronic diarrhea, intermittent, some constipation at times. Bentyl provided in May 2018. Ascites felt to be multifactorial.   Has missed dialysis 3 times. Going tomorrow. Foul-smelling diarrhea and gas. Diarrhea started up again last week. Cdiff dropped off yesterday but not resulted yet. A month or so ago was on antibiotics. Loose stool too numerous to count daily. Was on anti-diarrheal medication but took last 2 yesterday. Bentyl without much help lately but DID work for about 2 months when first prescribed. Cramping in lower abdomen. Not on a probiotic. Has issues with hemorrhoids with diarrhea. Multiple questions about possible cirrhosis.   Past Medical History:  Diagnosis Date  . Anemia of chronic disease   . Asthma   . Blind left eye   . Bronchitis   . Cataract   . Cholecystitis, acute 05/26/2013   Status post cholecystectomy  . Depression   . Diabetic foot ulcer ([place]) 03/01/2015  . Diastolic heart failure ([place])   . ESRD on hemodialysis ([place])    Started diaylsis 12/29/15  . Essential hypertension   . Fibroids   . Glaucoma   . History of blood transfusion   . History of pneumonia   . Hyperlipidemia   . Insulin-dependent diabetes mellitus with retinopathy ([place])   . Neuropathy   . Osteomyelitis ([place])     Toe on left foot    Past Surgical History:  Procedure Laterality Date  . A/V SHUNTOGRAM N/A 10/25/2016   Procedure: A/V Shuntogram - Right Arm;  Surgeon: Waynetta Sandy, MD;  Location: [facility];  Service: Cardiovascular;  Laterality: N/A;  . AV FISTULA PLACEMENT Right 10/17/2015   Procedure: INSERTION OF ARTERIOVENOUS GORE-TEX GRAFT RIGHT UPPER ARM WITH ACUSEAL;  Surgeon: Conrad Catarina, MD;  Location: [facility];  Service: Vascular;  Laterality: Right;  . CATARACT EXTRACTION W/ INTRAOCULAR LENS IMPLANT Bilateral   . CESAREAN SECTION    . CHOLECYSTECTOMY N/A 05/25/2013   Procedure: LAPAROSCOPIC CHOLECYSTECTOMY;  Surgeon: Jamesetta So, MD;  Location: [facility];  Service: General;  Laterality: N/A;  . COLONOSCOPY WITH PROPOFOL N/A 10/02/2016   Procedure: COLONOSCOPY WITH PROPOFOL;  Surgeon: Danie Binder, MD;  [facility];  Service: Endoscopy;  Laterality: N/A;  145 - pt knows to arrive at 11:15 per office  . ESOPHAGOGASTRODUODENOSCOPY (EGD) WITH PROPOFOL N/A 10/02/2016   Procedure: ESOPHAGOGASTRODUODENOSCOPY (EGD) WITH PROPOFOL;  Surgeon: Danie Binder, MD;  [facility];  Service: Endoscopy;  Laterality: N/A;  . EYE SURGERY    . PARS PLANA VITRECTOMY Left 11/24/2014   Procedure: PARS PLANA VITRECTOMY WITH 25 GAUGE;  Surgeon: Hurman Horn, MD;  Location: [facility];  Service: Ophthalmology;  Laterality: Left;  . PERIPHERAL VASCULAR CATHETERIZATION N/A 04/28/2015   Procedure: Bilateral Upper Extremity Venography;  Surgeon: Conrad [place], MD;  Location: [facility];  Service: Cardiovascular;  Laterality: N/A;  . PHOTOCOAGULATION WITH LASER Left 11/24/2014   Procedure: PHOTOCOAGULATION WITH LASER;  Surgeon: Hurman Horn, MD;  Location: [facility];  Service: Ophthalmology;  Laterality: Left;  with insertion of silicone oil  . SAVORY DILATION N/A 10/02/2016   Procedure: SAVORY DILATION;  Surgeon: Danie Binder, MD;  [facility];  Service: Endoscopy;   Laterality: N/A;  . TUBAL LIGATION      Current Outpatient Prescriptions  Medication Sig Dispense Refill  . acetaminophen (TYLENOL) 325 MG tablet Take 650 mg by mouth every 6 (six) hours as needed for mild pain.    Marland Kitchen aspirin 81 MG tablet Take 81 mg by mouth daily.    . carvedilol (COREG) 12.5 MG tablet TAKE ONE TABLET BY MOUTH TWICE DAILY 60 tablet 3  . cholecalciferol (VITAMIN D) 1000 units tablet Take 1,000 Units by mouth daily.     Marland Kitchen dicyclomine (BENTYL) 10 MG capsule TAKE ONE CAPSULE BY MOUTH FOUR TIMES DAILY BEFORE MEALS AND AT BEDTIME AS NEEDED FOR DIARRHEA, HOLD FOR CONSTIPATION. 120 capsule 3  . fluticasone (FLONASE) 50 MCG/ACT nasal spray Place 2 sprays into both nostrils daily as needed for allergies. 16 g 11  . gabapentin (NEURONTIN) 300 MG capsule Take 100 mg by mouth 2 (two) times daily.     Marland Kitchen glucose blood (PRODIGY NO CODING BLOOD GLUC) test strip Test twice daily 100 each 1  . hydrALAZINE (APRESOLINE) 25 MG tablet Take 1 tablet (25 mg total) by mouth 3 (three) times daily. (Patient taking differently: Take 25 mg by mouth 2 (two) times daily. ) 90 tablet 1  . HYDROcodone-acetaminophen (NORCO) 5-325 MG tablet Take 1-2 tablets by mouth every 6 (six) hours as needed for severe pain. 12 tablet 0  . mupirocin ointment (BACTROBAN) 2 % Place 1 application into the nose 2 (two) times daily. For 5 days 22 g 0  . PRODIGY TWIST TOP LANCETS 28G MISC USE TO CHECK BLOOD SUGAR UP TO FOUR TIMES DAILY. 100 each 1  . PROTONIX 40 MG tablet TAKE 1 BY MOUTH DAILY 90 tablet 3  . sevelamer carbonate (RENVELA) 800 MG tablet Take 2,400 mg by mouth 3 (three) times daily with meals. And with snacks.    . traZODone (DESYREL) 50 MG tablet Take 0.5-1 tablets (25-50 mg total) by mouth at bedtime as needed for sleep. 30 tablet 3  . hydrocortisone (ANUSOL-HC) 2.5 % rectal cream Place 1 application rectally 2 (two) times daily. 30 g 1   No current facility-administered medications for this visit.     Allergies  as of 04/05/2017 - Review Complete 04/05/2017  Allergen Reaction Noted  . Bactrim [sulfamethoxazole-trimethoprim] Nausea And Vomiting 05/21/2013  . Prednisone Other (See Comments) 07/09/2013    Family History  Problem Relation Age of Onset  . COPD Mother   . Cancer Father   . Lymphoma Father   . Diabetes Sister   . Deep vein thrombosis Sister   . Diabetes Brother   . Hyperlipidemia Brother   . Hypertension Brother   . Mental retardation Sister   . Colon cancer Neg Hx   . Liver disease Neg Hx     Social History   Social History  . Marital status: Single    Spouse name: N/A  . Number of children: N/A  . Years of education: N/A   Social History Main Topics  . Smoking status: Current Every Day Smoker    Packs/day: 1.00  Years: 21.00    Types: Cigarettes  . Smokeless tobacco: Never Used     Comment: one pack daily  . Alcohol use No  . Drug use: No  . Sexual activity: Not Currently    Birth control/ protection: Surgical   Other Topics Concern  . None   Social History Narrative  . None    Review of Systems: As mentioned in HPI   Physical Exam: BP 121/64   Pulse (!) 56   Temp 98.1 F (36.7 C) (Oral)   Ht 5' 3"  (1.6 m)   Wt 149 lb 14.4 oz (68 kg)   LMP 05/05/2015   BMI 26.55 kg/m  General:   Alert and oriented. No distress noted. Sallow-appearing Head:  Normocephalic and atraumatic. Eyes:  Conjuctiva clear without scleral icterus. Mouth:  Oral mucosa pink and moist. Abdomen:  +BS, soft, non-tender and non-distended. Palpable liver margin  Msk:  Symmetrical without gross deformities. Normal posture. Extremities:  Without edema. Neurologic:  Alert and  oriented x4 Psych:  Alert and cooperative. Normal mood and affect.

## 2017-04-05 NOTE — Patient Instructions (Signed)
We will see what the stool study shows. If this is negative, we will try a different agent for your loose stools instead of the Bentyl.  For now: start taking a probiotic daily such as Align, Restora, Digestive Advantage, [employer], or [employer] brand. We have provided samples.   I sent in a cream to your pharmacy for your rectum to use twice a day.   We will have you see Dr. Oneida Alar in the near future.

## 2017-04-05 NOTE — Assessment & Plan Note (Signed)
History of chronic intermittent diarrhea, doing well with Bentyl several months ago but now with acute on chronic diarrhea and concern for infectious process (CDI) due to recent abx exposure. Cdiff pending. Will need to await stool studies prior to any other changes in medication regimen. In interim, start probiotic. Anusol for hemorrhoid flare.

## 2017-04-06 DIAGNOSIS — Z992 Dependence on renal dialysis: Secondary | ICD-10-CM | POA: Diagnosis not present

## 2017-04-06 DIAGNOSIS — D509 Iron deficiency anemia, unspecified: Secondary | ICD-10-CM | POA: Diagnosis not present

## 2017-04-06 DIAGNOSIS — N186 End stage renal disease: Secondary | ICD-10-CM | POA: Diagnosis not present

## 2017-04-06 DIAGNOSIS — N250 Renal osteodystrophy: Secondary | ICD-10-CM | POA: Diagnosis not present

## 2017-04-06 DIAGNOSIS — D631 Anemia in chronic kidney disease: Secondary | ICD-10-CM | POA: Diagnosis not present

## 2017-04-06 DIAGNOSIS — E8779 Other fluid overload: Secondary | ICD-10-CM | POA: Diagnosis not present

## 2017-04-07 NOTE — Progress Notes (Signed)
Please let patient know Cdiff negative.  FYI AB

## 2017-04-08 NOTE — Progress Notes (Signed)
Pt said she is not having any diarrhea now, she is usually having a BM daily. She will let us know if she has problems.

## 2017-04-08 NOTE — Progress Notes (Signed)
Doris: how is the diarrhea?

## 2017-04-08 NOTE — Progress Notes (Signed)
PT is aware.

## 2017-04-08 NOTE — Progress Notes (Signed)
Fabulous!

## 2017-04-09 DIAGNOSIS — Z992 Dependence on renal dialysis: Secondary | ICD-10-CM | POA: Diagnosis not present

## 2017-04-09 DIAGNOSIS — E8779 Other fluid overload: Secondary | ICD-10-CM | POA: Diagnosis not present

## 2017-04-09 DIAGNOSIS — N186 End stage renal disease: Secondary | ICD-10-CM | POA: Diagnosis not present

## 2017-04-09 DIAGNOSIS — D631 Anemia in chronic kidney disease: Secondary | ICD-10-CM | POA: Diagnosis not present

## 2017-04-09 DIAGNOSIS — D509 Iron deficiency anemia, unspecified: Secondary | ICD-10-CM | POA: Diagnosis not present

## 2017-04-09 DIAGNOSIS — N250 Renal osteodystrophy: Secondary | ICD-10-CM | POA: Diagnosis not present

## 2017-04-10 ENCOUNTER — Ambulatory Visit: Payer: Medicare Other | Admitting: Gastroenterology

## 2017-04-11 DIAGNOSIS — Z992 Dependence on renal dialysis: Secondary | ICD-10-CM | POA: Diagnosis not present

## 2017-04-11 DIAGNOSIS — N186 End stage renal disease: Secondary | ICD-10-CM | POA: Diagnosis not present

## 2017-04-11 DIAGNOSIS — D631 Anemia in chronic kidney disease: Secondary | ICD-10-CM | POA: Diagnosis not present

## 2017-04-11 DIAGNOSIS — N250 Renal osteodystrophy: Secondary | ICD-10-CM | POA: Diagnosis not present

## 2017-04-11 DIAGNOSIS — E8779 Other fluid overload: Secondary | ICD-10-CM | POA: Diagnosis not present

## 2017-04-11 DIAGNOSIS — D509 Iron deficiency anemia, unspecified: Secondary | ICD-10-CM | POA: Diagnosis not present

## 2017-04-15 DIAGNOSIS — E8779 Other fluid overload: Secondary | ICD-10-CM | POA: Diagnosis not present

## 2017-04-15 DIAGNOSIS — N250 Renal osteodystrophy: Secondary | ICD-10-CM | POA: Diagnosis not present

## 2017-04-15 DIAGNOSIS — N186 End stage renal disease: Secondary | ICD-10-CM | POA: Diagnosis not present

## 2017-04-15 DIAGNOSIS — D509 Iron deficiency anemia, unspecified: Secondary | ICD-10-CM | POA: Diagnosis not present

## 2017-04-15 DIAGNOSIS — Z992 Dependence on renal dialysis: Secondary | ICD-10-CM | POA: Diagnosis not present

## 2017-04-15 DIAGNOSIS — D631 Anemia in chronic kidney disease: Secondary | ICD-10-CM | POA: Diagnosis not present

## 2017-04-16 ENCOUNTER — Telehealth: Payer: Self-pay | Admitting: Gastroenterology

## 2017-04-16 DIAGNOSIS — D631 Anemia in chronic kidney disease: Secondary | ICD-10-CM | POA: Diagnosis not present

## 2017-04-16 DIAGNOSIS — E8779 Other fluid overload: Secondary | ICD-10-CM | POA: Diagnosis not present

## 2017-04-16 DIAGNOSIS — D509 Iron deficiency anemia, unspecified: Secondary | ICD-10-CM | POA: Diagnosis not present

## 2017-04-16 DIAGNOSIS — Z992 Dependence on renal dialysis: Secondary | ICD-10-CM | POA: Diagnosis not present

## 2017-04-16 DIAGNOSIS — N186 End stage renal disease: Secondary | ICD-10-CM | POA: Diagnosis not present

## 2017-04-16 DIAGNOSIS — N250 Renal osteodystrophy: Secondary | ICD-10-CM | POA: Diagnosis not present

## 2017-04-16 MED ORDER — RESTORA PO CAPS: 1.0000 | ORAL_CAPSULE | Freq: Every day | ORAL | 3 refills | Status: DC

## 2017-04-16 NOTE — Telephone Encounter (Signed)
Pt said that she has about a week's worth of samples (Restora) left and wanted to have a prescription called into [facility] in [place].

## 2017-04-16 NOTE — Telephone Encounter (Signed)
Completed. May or may not be covered as it is available OTC>

## 2017-04-16 NOTE — Telephone Encounter (Signed)
Pt's friend, Lannette Donath, who came with her to the office, is aware this is an OTC medication. I am leaving her some more samples at front desk for pick up. #20 tablets.

## 2017-04-16 NOTE — Telephone Encounter (Signed)
Forwarding FYI to Roseanne Kaufman, NP.

## 2017-04-18 DIAGNOSIS — N250 Renal osteodystrophy: Secondary | ICD-10-CM | POA: Diagnosis not present

## 2017-04-18 DIAGNOSIS — N186 End stage renal disease: Secondary | ICD-10-CM | POA: Diagnosis not present

## 2017-04-18 DIAGNOSIS — Z992 Dependence on renal dialysis: Secondary | ICD-10-CM | POA: Diagnosis not present

## 2017-04-18 DIAGNOSIS — E8779 Other fluid overload: Secondary | ICD-10-CM | POA: Diagnosis not present

## 2017-04-18 DIAGNOSIS — D631 Anemia in chronic kidney disease: Secondary | ICD-10-CM | POA: Diagnosis not present

## 2017-04-18 DIAGNOSIS — D509 Iron deficiency anemia, unspecified: Secondary | ICD-10-CM | POA: Diagnosis not present

## 2017-04-22 DIAGNOSIS — N186 End stage renal disease: Secondary | ICD-10-CM | POA: Diagnosis not present

## 2017-04-22 DIAGNOSIS — E8779 Other fluid overload: Secondary | ICD-10-CM | POA: Diagnosis not present

## 2017-04-22 DIAGNOSIS — N250 Renal osteodystrophy: Secondary | ICD-10-CM | POA: Diagnosis not present

## 2017-04-22 DIAGNOSIS — D509 Iron deficiency anemia, unspecified: Secondary | ICD-10-CM | POA: Diagnosis not present

## 2017-04-22 DIAGNOSIS — Z992 Dependence on renal dialysis: Secondary | ICD-10-CM | POA: Diagnosis not present

## 2017-04-22 DIAGNOSIS — D631 Anemia in chronic kidney disease: Secondary | ICD-10-CM | POA: Diagnosis not present

## 2017-04-23 DIAGNOSIS — E119 Type 2 diabetes mellitus without complications: Secondary | ICD-10-CM | POA: Diagnosis not present

## 2017-04-23 DIAGNOSIS — N186 End stage renal disease: Secondary | ICD-10-CM | POA: Diagnosis not present

## 2017-04-23 DIAGNOSIS — Z992 Dependence on renal dialysis: Secondary | ICD-10-CM | POA: Diagnosis not present

## 2017-04-23 DIAGNOSIS — Z794 Long term (current) use of insulin: Secondary | ICD-10-CM | POA: Diagnosis not present

## 2017-04-23 DIAGNOSIS — E8779 Other fluid overload: Secondary | ICD-10-CM | POA: Diagnosis not present

## 2017-04-23 DIAGNOSIS — N250 Renal osteodystrophy: Secondary | ICD-10-CM | POA: Diagnosis not present

## 2017-04-23 DIAGNOSIS — D631 Anemia in chronic kidney disease: Secondary | ICD-10-CM | POA: Diagnosis not present

## 2017-04-23 DIAGNOSIS — D509 Iron deficiency anemia, unspecified: Secondary | ICD-10-CM | POA: Diagnosis not present

## 2017-04-24 ENCOUNTER — Ambulatory Visit: Payer: Medicare Other | Admitting: Gastroenterology

## 2017-04-25 ENCOUNTER — Encounter (HOSPITAL_COMMUNITY): Payer: Self-pay | Admitting: *Deleted

## 2017-04-25 ENCOUNTER — Emergency Department (HOSPITAL_COMMUNITY)
Admission: EM | Admit: 2017-04-25 | Discharge: 2017-04-25 | Disposition: A | Discharge status: Home / Self Care | Payer: Medicare Other | Attending: Emergency Medicine | Admitting: Emergency Medicine

## 2017-04-25 DIAGNOSIS — Z79899 Other long term (current) drug therapy: Secondary | ICD-10-CM | POA: Insufficient documentation

## 2017-04-25 DIAGNOSIS — D509 Iron deficiency anemia, unspecified: Secondary | ICD-10-CM | POA: Diagnosis not present

## 2017-04-25 DIAGNOSIS — I110 Hypertensive heart disease with heart failure: Secondary | ICD-10-CM | POA: Diagnosis not present

## 2017-04-25 DIAGNOSIS — Z794 Long term (current) use of insulin: Secondary | ICD-10-CM | POA: Diagnosis not present

## 2017-04-25 DIAGNOSIS — L089 Local infection of the skin and subcutaneous tissue, unspecified: Secondary | ICD-10-CM | POA: Diagnosis not present

## 2017-04-25 DIAGNOSIS — E1140 Type 2 diabetes mellitus with diabetic neuropathy, unspecified: Secondary | ICD-10-CM | POA: Insufficient documentation

## 2017-04-25 DIAGNOSIS — D631 Anemia in chronic kidney disease: Secondary | ICD-10-CM | POA: Diagnosis not present

## 2017-04-25 DIAGNOSIS — E11319 Type 2 diabetes mellitus with unspecified diabetic retinopathy without macular edema: Secondary | ICD-10-CM | POA: Insufficient documentation

## 2017-04-25 DIAGNOSIS — N250 Renal osteodystrophy: Secondary | ICD-10-CM | POA: Diagnosis not present

## 2017-04-25 DIAGNOSIS — I5032 Chronic diastolic (congestive) heart failure: Secondary | ICD-10-CM | POA: Insufficient documentation

## 2017-04-25 DIAGNOSIS — F17210 Nicotine dependence, cigarettes, uncomplicated: Secondary | ICD-10-CM | POA: Insufficient documentation

## 2017-04-25 DIAGNOSIS — Z7982 Long term (current) use of aspirin: Secondary | ICD-10-CM | POA: Insufficient documentation

## 2017-04-25 DIAGNOSIS — E8779 Other fluid overload: Secondary | ICD-10-CM | POA: Diagnosis not present

## 2017-04-25 DIAGNOSIS — Z992 Dependence on renal dialysis: Secondary | ICD-10-CM | POA: Diagnosis not present

## 2017-04-25 DIAGNOSIS — G501 Atypical facial pain: Secondary | ICD-10-CM | POA: Diagnosis present

## 2017-04-25 DIAGNOSIS — N186 End stage renal disease: Secondary | ICD-10-CM | POA: Diagnosis not present

## 2017-04-25 LAB — CBG MONITORING, ED: Glucose-Capillary: 158 mg/dL — ABNORMAL HIGH (ref 65–99)

## 2017-04-25 MED ORDER — HYDROCODONE-ACETAMINOPHEN 5-325 MG PO TABS
1.0000 | ORAL_TABLET | Freq: Once | ORAL | Status: AC
  Administered 2017-04-25: 1 via ORAL
  Filled 2017-04-25: qty 1

## 2017-04-25 MED ORDER — HYDROCODONE-ACETAMINOPHEN 5-325 MG PO TABS: 1.0000 | ORAL_TABLET | ORAL | 0 refills | Status: DC | PRN

## 2017-04-25 MED ORDER — CLINDAMYCIN HCL 150 MG PO CAPS
300.0000 mg | ORAL_CAPSULE | Freq: Once | ORAL | Status: AC
  Administered 2017-04-25: 300 mg via ORAL
  Filled 2017-04-25: qty 2

## 2017-04-25 MED ORDER — CLINDAMYCIN HCL 300 MG PO CAPS: 300.0000 mg | ORAL_CAPSULE | Freq: Four times a day (QID) | ORAL | 0 refills | Status: DC

## 2017-04-25 NOTE — ED Notes (Signed)
Pt states nose started hurting & swelling yesterday. Noted redness & swelling to the left nare.

## 2017-04-25 NOTE — ED Notes (Signed)
Pt alert & oriented x4, stable gait. Patient given discharge instructions, paperwork & prescription(s). Patient informed not to drive, operate any equipment & handel any important documents 4 hours after taking pain medication. Patient  instructed to stop at the registration desk to finish any additional paperwork. Patient  verbalized understanding. Pt left department w/ no further questions. 

## 2017-04-25 NOTE — ED Provider Notes (Signed)
[facility] DEPT Provider Note   CSN: [id number] Arrival date & time: 04/25/17  1647     History   Chief Complaint Chief Complaint  Patient presents with  . Facial Pain    HPI Kara Knapp is a 46 y.o. female who is an insulin dependent diabetic, ESRD (dialysis today) also with history of mrsa, occasional abscesses, most recently a pilonidal cyst abscess in August presenting with pain, swelling and redness of her distal left nostril since yesterday.  She has had no treatments prior to arrival today, but took tylenol last night before bed and was able to sleep.  She reports has had clear nasal discharge associated with her seasonal allergies and believes frequent nose blowing may have triggered this infection.  There has been no drainage from the site.  She denies fevers, chills or other complaint.  She has increased pain with palpation or with wriggling her nose.  She has not checked her glucose level this week but usually is less than 200.   The history is provided by the patient and a relative.    Past Medical History:  Diagnosis Date  . Anemia of chronic disease   . Asthma   . Blind left eye   . Bronchitis   . Cataract   . Cholecystitis, acute 05/26/2013   Status post cholecystectomy  . Depression   . Diabetic foot ulcer ([place]) 03/01/2015  . Diastolic heart failure ([place])   . ESRD on hemodialysis ([place])    Started diaylsis 12/29/15  . Essential hypertension   . Fibroids   . Glaucoma   . History of blood transfusion   . History of pneumonia   . Hyperlipidemia   . Insulin-dependent diabetes mellitus with retinopathy ([place])   . Neuropathy   . Osteomyelitis ([place])    Toe on left foot    Patient Active Problem List   Diagnosis Date Noted  . Encounter for well woman exam with routine gynecological exam 01/14/2017  . Ascites 12/21/2016  . Constipation 11/08/2016  . Fatty liver 11/08/2016  . Diarrhea   . Rectal bleeding 09/07/2016  . Legally blind 09/06/2016  .  Heme positive stool 05/04/2016  . Hepatomegaly 05/04/2016  . Abnormal LFTs 05/04/2016  . Chronic diarrhea 05/04/2016  . Esophageal dysphagia 05/04/2016  . Anemia 05/04/2016  . Mood disorder ([place]) 08/12/2015  . Fibroid, uterine 01/24/2015  . Iron deficiency anemia due to chronic blood loss 01/24/2015  . Menorrhagia with irregular cycle 01/24/2015  . Chronic diastolic CHF (congestive heart failure) ([place]) 01/21/2015  . Insulin-dependent diabetes mellitus with retinopathy ([place]) 01/21/2015  . Diabetic neuropathy ([place]) 01/21/2015  . ESRD on dialysis ([place]) 01/21/2015  . Anemia of chronic disease 01/21/2015  . Tobacco abuse 01/21/2015  . Cholecystitis, acute 05/26/2013  . Essential hypertension, benign 05/25/2013    Past Surgical History:  Procedure Laterality Date  . A/V SHUNTOGRAM N/A 10/25/2016   Procedure: A/V Shuntogram - Right Arm;  Surgeon: Waynetta Sandy, MD;  Location: [facility];  Service: Cardiovascular;  Laterality: N/A;  . AV FISTULA PLACEMENT Right 10/17/2015   Procedure: INSERTION OF ARTERIOVENOUS GORE-TEX GRAFT RIGHT UPPER ARM WITH ACUSEAL;  Surgeon: Conrad , MD;  Location: [facility];  Service: Vascular;  Laterality: Right;  . CATARACT EXTRACTION W/ INTRAOCULAR LENS IMPLANT Bilateral   . CESAREAN SECTION    . CHOLECYSTECTOMY N/A 05/25/2013   Procedure: LAPAROSCOPIC CHOLECYSTECTOMY;  Surgeon: Jamesetta So, MD;  Location[facility];  Service: General;  Laterality: N/A;  .  COLONOSCOPY WITH PROPOFOL N/A 10/02/2016   Procedure: COLONOSCOPY WITH PROPOFOL;  Surgeon: Danie Binder, MD;  [facility];  Service: Endoscopy;  Laterality: N/A;  145 - pt knows to arrive at 11:15 per office  . ESOPHAGOGASTRODUODENOSCOPY (EGD) WITH PROPOFOL N/A 10/02/2016   Procedure: ESOPHAGOGASTRODUODENOSCOPY (EGD) WITH PROPOFOL;  Surgeon: Danie Binder, MD;  [facility];  Service: Endoscopy;  Laterality: N/A;  . EYE SURGERY    . PARS PLANA VITRECTOMY Left 11/24/2014     Procedure: PARS PLANA VITRECTOMY WITH 25 GAUGE;  Surgeon: Hurman Horn, MD;  Location: [facility];  Service: Ophthalmology;  Laterality: Left;  . PERIPHERAL VASCULAR CATHETERIZATION N/A 04/28/2015   Procedure: Bilateral Upper Extremity Venography;  Surgeon: Conrad [place], MD;  Location: [facility];  Service: Cardiovascular;  Laterality: N/A;  . PHOTOCOAGULATION WITH LASER Left 11/24/2014   Procedure: PHOTOCOAGULATION WITH LASER;  Surgeon: Hurman Horn, MD;  Location: [facility];  Service: Ophthalmology;  Laterality: Left;  with insertion of silicone oil  . SAVORY DILATION N/A 10/02/2016   Procedure: SAVORY DILATION;  Surgeon: Danie Binder, MD;  [facility];  Service: Endoscopy;  Laterality: N/A;  . TUBAL LIGATION      OB History    Gravida Para Term Preterm AB Living   5 5           SAB TAB Ectopic Multiple Live Births                   Home Medications    Prior to Admission medications   Medication Sig Start Date End Date Taking? Authorizing Provider  acetaminophen (TYLENOL) 325 MG tablet Take 650 mg by mouth every 6 (six) hours as needed for mild pain.    [provider]  aspirin 81 MG tablet Take 81 mg by mouth daily.    [provider]  carvedilol (COREG) 12.5 MG tablet TAKE ONE TABLET BY MOUTH TWICE DAILY 03/13/17   Timmothy Euler, MD  cholecalciferol (VITAMIN D) 1000 units tablet Take 1,000 Units by mouth daily.     [provider]  clindamycin (CLEOCIN) 300 MG capsule Take 1 capsule (300 mg total) by mouth every 6 (six) hours. 04/25/17   Ezmeralda Stefanick, Almyra Free, PA-C  dicyclomine (BENTYL) 10 MG capsule TAKE ONE CAPSULE BY MOUTH FOUR TIMES DAILY BEFORE MEALS AND AT BEDTIME AS NEEDED FOR DIARRHEA, HOLD FOR CONSTIPATION. 03/12/17   Mahala Menghini, PA-C  fluticasone (FLONASE) 50 MCG/ACT nasal spray Place 2 sprays into both nostrils daily as needed for allergies. 09/05/16   Dettinger, Fransisca Kaufmann, MD  gabapentin (NEURONTIN) 300 MG capsule Take 100 mg by mouth 2  (two) times daily.     [provider]  glucose blood (PRODIGY NO CODING BLOOD GLUC) test strip Test twice daily 12/10/16   Timmothy Euler, MD  hydrALAZINE (APRESOLINE) 25 MG tablet Take 1 tablet (25 mg total) by mouth 3 (three) times daily. Patient taking differently: Take 25 mg by mouth 2 (two) times daily.  07/08/15   Dettinger, Fransisca Kaufmann, MD  HYDROcodone-acetaminophen (NORCO/VICODIN) 5-325 MG tablet Take 1 tablet by mouth every 4 (four) hours as needed. 04/25/17   Evalee Jefferson, PA-C  hydrocortisone (ANUSOL-HC) 2.5 % rectal cream Place 1 application rectally 2 (two) times daily. 04/05/17   Annitta Needs, NP  mupirocin ointment (BACTROBAN) 2 % Place 1 application into the nose 2 (two) times daily. For 5 days 03/11/17   Timmothy Euler,  MD  Probiotic Product (RESTORA) CAPS Take 1 capsule by mouth daily. 04/16/17   Annitta Needs, NP  PRODIGY TWIST TOP LANCETS 28G MISC USE TO CHECK BLOOD SUGAR UP TO FOUR TIMES DAILY. 03/13/17   Timmothy Euler, MD  PROTONIX 40 MG tablet TAKE 1 BY MOUTH DAILY 01/18/17   Carlis Stable, NP  sevelamer carbonate (RENVELA) 800 MG tablet Take 2,400 mg by mouth 3 (three) times daily with meals. And with snacks.    [provider]  traZODone (DESYREL) 50 MG tablet Take 0.5-1 tablets (25-50 mg total) by mouth at bedtime as needed for sleep. 01/07/17   Timmothy Euler, MD    Family History Family History  Problem Relation Age of Onset  . COPD Mother   . Cancer Father   . Lymphoma Father   . Diabetes Sister   . Deep vein thrombosis Sister   . Diabetes Brother   . Hyperlipidemia Brother   . Hypertension Brother   . Mental retardation Sister   . Colon cancer Neg Hx   . Liver disease Neg Hx     Social History Social History  Substance Use Topics  . Smoking status: Current Every Day Smoker    Packs/day: 1.00    Years: 21.00    Types: Cigarettes  . Smokeless tobacco: Never Used     Comment: one pack daily  . Alcohol use No     Allergies     Bactrim [sulfamethoxazole-trimethoprim] and Prednisone   Review of Systems Review of Systems  Constitutional: Negative for chills and fever.  Respiratory: Negative for shortness of breath and wheezing.   Skin: Positive for color change and wound.  Neurological: Negative for numbness.     Physical Exam Updated Vital Signs BP (!) 184/61 (BP Location: Left Arm)   Pulse 61   Temp 98.1 F (36.7 C) (Oral)   Resp 19   LMP 05/05/2015   SpO2 99%   Physical Exam  Constitutional: She appears well-developed and well-nourished. No distress.  HENT:  Head: Normocephalic and atraumatic.  Right Ear: External ear normal.  Left Ear: External ear normal.  Nose:    Mouth/Throat: Oropharynx is clear and moist.  Neck: Neck supple.  Cardiovascular: Normal rate.   Pulmonary/Chest: Effort normal. She has no wheezes.  Musculoskeletal: Normal range of motion. She exhibits no edema.  Skin: Rash noted.     ED Treatments / Results  Labs (all labs ordered are listed, but only abnormal results are displayed) Labs Reviewed  CBG MONITORING, ED - Abnormal; Notable for the following:       Result Value   Glucose-Capillary 158 (*)    All other components within normal limits    EKG  EKG Interpretation None       Radiology No results found.  Procedures Procedures (including critical care time)  Medications Ordered in ED Medications  clindamycin (CLEOCIN) capsule 300 mg (300 mg Oral Given 04/25/17 1743)  HYDROcodone-acetaminophen (NORCO/VICODIN) 5-325 MG per tablet 1 tablet (1 tablet Oral Given 04/25/17 1743)     Initial Impression / Assessment and Plan / ED Course  I have reviewed the triage vital signs and the nursing notes.  Pertinent labs & imaging results that were available during my care of the patient were reviewed by me and considered in my medical decision making (see chart for details).     Pt advised close f/u in 1-2 days if sx are not improving or for any worsened  pain, swelling, or new  sx.  Started on clindamycin, warm compresses. Hydrocodone. Dg at bedside who lives in mothers home and helps her given her visual handicap.  Pt and dg understand and agree with plan.   Final Clinical Impressions(s) / ED Diagnoses   Final diagnoses:  Facial infection    New Prescriptions New Prescriptions   CLINDAMYCIN (CLEOCIN) 300 MG CAPSULE    Take 1 capsule (300 mg total) by mouth every 6 (six) hours.   HYDROCODONE-ACETAMINOPHEN (NORCO/VICODIN) 5-325 MG TABLET    Take 1 tablet by mouth every 4 (four) hours as needed.     Evalee Jefferson, PA-C 04/25/17 Cecille Amsterdam    Virgel Manifold, MD 04/26/17 1239

## 2017-04-25 NOTE — ED Triage Notes (Signed)
Swelling and pain left side of nose

## 2017-04-25 NOTE — Discharge Instructions (Signed)
Take one more dose of the antibiotic before bedtime tonight.  Applying a gentle warm compress (wash cloth) frequently to the site of infection.  You may take the hydrocodone prescribed for pain relief.  This will make you drowsy - do not drive within 4 hours of taking this medication.

## 2017-04-27 DIAGNOSIS — Z992 Dependence on renal dialysis: Secondary | ICD-10-CM | POA: Diagnosis not present

## 2017-04-27 DIAGNOSIS — D631 Anemia in chronic kidney disease: Secondary | ICD-10-CM | POA: Diagnosis not present

## 2017-04-27 DIAGNOSIS — D509 Iron deficiency anemia, unspecified: Secondary | ICD-10-CM | POA: Diagnosis not present

## 2017-04-27 DIAGNOSIS — N186 End stage renal disease: Secondary | ICD-10-CM | POA: Diagnosis not present

## 2017-04-27 DIAGNOSIS — N250 Renal osteodystrophy: Secondary | ICD-10-CM | POA: Diagnosis not present

## 2017-04-27 DIAGNOSIS — E8779 Other fluid overload: Secondary | ICD-10-CM | POA: Diagnosis not present

## 2017-04-28 DIAGNOSIS — Z992 Dependence on renal dialysis: Secondary | ICD-10-CM | POA: Diagnosis not present

## 2017-04-28 DIAGNOSIS — N186 End stage renal disease: Secondary | ICD-10-CM | POA: Diagnosis not present

## 2017-04-29 DIAGNOSIS — R05 Cough: Secondary | ICD-10-CM | POA: Diagnosis not present

## 2017-04-29 DIAGNOSIS — Z7982 Long term (current) use of aspirin: Secondary | ICD-10-CM | POA: Diagnosis not present

## 2017-04-29 DIAGNOSIS — N189 Chronic kidney disease, unspecified: Secondary | ICD-10-CM | POA: Diagnosis not present

## 2017-04-29 DIAGNOSIS — E1122 Type 2 diabetes mellitus with diabetic chronic kidney disease: Secondary | ICD-10-CM | POA: Diagnosis not present

## 2017-04-29 DIAGNOSIS — R0789 Other chest pain: Secondary | ICD-10-CM | POA: Diagnosis not present

## 2017-04-29 DIAGNOSIS — I10 Essential (primary) hypertension: Secondary | ICD-10-CM | POA: Diagnosis not present

## 2017-04-29 DIAGNOSIS — F17200 Nicotine dependence, unspecified, uncomplicated: Secondary | ICD-10-CM | POA: Diagnosis not present

## 2017-04-29 DIAGNOSIS — R079 Chest pain, unspecified: Secondary | ICD-10-CM | POA: Diagnosis not present

## 2017-04-29 DIAGNOSIS — E1065 Type 1 diabetes mellitus with hyperglycemia: Secondary | ICD-10-CM | POA: Diagnosis not present

## 2017-04-29 DIAGNOSIS — I129 Hypertensive chronic kidney disease with stage 1 through stage 4 chronic kidney disease, or unspecified chronic kidney disease: Secondary | ICD-10-CM | POA: Diagnosis not present

## 2017-04-29 DIAGNOSIS — Z79899 Other long term (current) drug therapy: Secondary | ICD-10-CM | POA: Diagnosis not present

## 2017-04-29 DIAGNOSIS — E1140 Type 2 diabetes mellitus with diabetic neuropathy, unspecified: Secondary | ICD-10-CM | POA: Diagnosis not present

## 2017-04-29 DIAGNOSIS — Z992 Dependence on renal dialysis: Secondary | ICD-10-CM | POA: Diagnosis not present

## 2017-04-30 DIAGNOSIS — D509 Iron deficiency anemia, unspecified: Secondary | ICD-10-CM | POA: Diagnosis not present

## 2017-04-30 DIAGNOSIS — Z992 Dependence on renal dialysis: Secondary | ICD-10-CM | POA: Diagnosis not present

## 2017-04-30 DIAGNOSIS — D631 Anemia in chronic kidney disease: Secondary | ICD-10-CM | POA: Diagnosis not present

## 2017-04-30 DIAGNOSIS — N250 Renal osteodystrophy: Secondary | ICD-10-CM | POA: Diagnosis not present

## 2017-04-30 DIAGNOSIS — N186 End stage renal disease: Secondary | ICD-10-CM | POA: Diagnosis not present

## 2017-04-30 DIAGNOSIS — Z23 Encounter for immunization: Secondary | ICD-10-CM | POA: Diagnosis not present

## 2017-05-02 DIAGNOSIS — N186 End stage renal disease: Secondary | ICD-10-CM | POA: Diagnosis not present

## 2017-05-02 DIAGNOSIS — D631 Anemia in chronic kidney disease: Secondary | ICD-10-CM | POA: Diagnosis not present

## 2017-05-02 DIAGNOSIS — Z23 Encounter for immunization: Secondary | ICD-10-CM | POA: Diagnosis not present

## 2017-05-02 DIAGNOSIS — Z992 Dependence on renal dialysis: Secondary | ICD-10-CM | POA: Diagnosis not present

## 2017-05-02 DIAGNOSIS — D509 Iron deficiency anemia, unspecified: Secondary | ICD-10-CM | POA: Diagnosis not present

## 2017-05-02 DIAGNOSIS — N250 Renal osteodystrophy: Secondary | ICD-10-CM | POA: Diagnosis not present

## 2017-05-03 ENCOUNTER — Telehealth: Payer: Self-pay | Admitting: Gastroenterology

## 2017-05-03 NOTE — Telephone Encounter (Signed)
FYI to Roseanne Kaufman, NP who saw her last.

## 2017-05-03 NOTE — Telephone Encounter (Signed)
Pt has OV on 10/24 with SF. She is wanting to be seen sooner due to dysphagia. I offered her an appt for Monday and Tuesday next week and she can't come to either one. She said that she has samples here and has been unable to get to the office to pick them up and asked if we could mail them to her. I told her, No, we can't mail her samples. She said to call in a prescription to [facility] of her restora and she would call again next week to see if we have anymore cancellations.

## 2017-05-03 NOTE — Telephone Encounter (Signed)
I called pt and LMOM that the Restora is OTC and she just needs to check with her pharmacy. Every pharmacy does not carry it. Also, told her to call early next week to see if we have had a cancellation that she can use.

## 2017-05-04 DIAGNOSIS — Z23 Encounter for immunization: Secondary | ICD-10-CM | POA: Diagnosis not present

## 2017-05-04 DIAGNOSIS — D509 Iron deficiency anemia, unspecified: Secondary | ICD-10-CM | POA: Diagnosis not present

## 2017-05-04 DIAGNOSIS — N186 End stage renal disease: Secondary | ICD-10-CM | POA: Diagnosis not present

## 2017-05-04 DIAGNOSIS — Z992 Dependence on renal dialysis: Secondary | ICD-10-CM | POA: Diagnosis not present

## 2017-05-04 DIAGNOSIS — N250 Renal osteodystrophy: Secondary | ICD-10-CM | POA: Diagnosis not present

## 2017-05-04 DIAGNOSIS — D631 Anemia in chronic kidney disease: Secondary | ICD-10-CM | POA: Diagnosis not present

## 2017-05-06 ENCOUNTER — Encounter: Payer: Self-pay | Admitting: Family Medicine

## 2017-05-06 ENCOUNTER — Ambulatory Visit (INDEPENDENT_AMBULATORY_CARE_PROVIDER_SITE_OTHER): Payer: Medicare Other | Admitting: Family Medicine

## 2017-05-06 ENCOUNTER — Other Ambulatory Visit: Payer: Self-pay | Admitting: Family Medicine

## 2017-05-06 VITALS — BP 159/76 | HR 52 | Temp 97.0°F | Ht 63.0 in | Wt 141.0 lb

## 2017-05-06 DIAGNOSIS — L03211 Cellulitis of face: Secondary | ICD-10-CM | POA: Diagnosis not present

## 2017-05-06 DIAGNOSIS — J441 Chronic obstructive pulmonary disease with (acute) exacerbation: Secondary | ICD-10-CM | POA: Diagnosis not present

## 2017-05-06 MED ORDER — DEXAMETHASONE 4 MG PO TABS: 4.0000 mg | ORAL_TABLET | Freq: Two times a day (BID) | ORAL | 0 refills | Status: DC

## 2017-05-06 NOTE — Progress Notes (Signed)
LMP 05/05/2015    Subjective:    Patient ID: Kara Knapp, female    DOB: 22-Oct-1970, 45 y.o.   MRN: [id number]  HPI: ALAYJA ARMAS is a 46 y.o. female presenting on 05/06/2017 for No chief complaint on file.   HPI Cough and congestion and wheezing Patient comes in with cough and congestion and wheezing that I'm going on for over a week. She was seen in urgent care for an infection on face and given clindamycin which she is still taking is totally been 4 days since she started that. She denies any fevers or chills but does have shortness of breath and some wheezing and some congestion.  Facial skin lesion Patient has a skin lesion on her nose that is she is on clindamycin for an says it has been improving and decreasing in size but is still red and warm and painful on the left side of her nose. Her an appointment the friend that is here with her it has greatly decreased in size.  Relevant past medical, surgical, family and social history reviewed and updated as indicated. Interim medical history since our last visit reviewed. Allergies and medications reviewed and updated.  Review of Systems  Constitutional: Negative for chills and fever.  HENT: Positive for congestion, postnasal drip, rhinorrhea, sinus pressure, sneezing and sore throat. Negative for ear discharge and ear pain.   Eyes: Negative for pain, redness and visual disturbance.  Respiratory: Positive for cough and wheezing. Negative for chest tightness and shortness of breath.   Cardiovascular: Negative for chest pain and leg swelling.  Genitourinary: Negative for difficulty urinating and dysuria.  Musculoskeletal: Negative for back pain and gait problem.  Skin: Positive for color change. Negative for rash.  Neurological: Negative for light-headedness and headaches.  Psychiatric/Behavioral: Negative for agitation and behavioral problems.  All other systems reviewed and are negative.   Per HPI unless specifically  indicated above        Objective:    LMP 05/05/2015   Wt Readings from Last 3 Encounters:  04/05/17 149 lb 14.4 oz (68 kg)  03/11/17 150 lb (68 kg)  03/04/17 155 lb (70.3 kg)    Physical Exam  Constitutional: She is oriented to person, place, and time. She appears well-developed and well-nourished. No distress.  HENT:  Right Ear: Tympanic membrane, external ear and ear canal normal.  Left Ear: Tympanic membrane, external ear and ear canal normal.  Nose: Mucosal edema and rhinorrhea present. No epistaxis. Right sinus exhibits no maxillary sinus tenderness and no frontal sinus tenderness. Left sinus exhibits no maxillary sinus tenderness and no frontal sinus tenderness.  Mouth/Throat: Uvula is midline and mucous membranes are normal. Posterior oropharyngeal edema and posterior oropharyngeal erythema present. No oropharyngeal exudate or tonsillar abscesses.  Eyes: Conjunctivae and EOM are normal.  Neck: Neck supple. No thyromegaly present.  Cardiovascular: Normal rate, regular rhythm, normal heart sounds and intact distal pulses.   No murmur heard. Pulmonary/Chest: Effort normal. No respiratory distress. She has wheezes. She has no rales.  Musculoskeletal: Normal range of motion.  Lymphadenopathy:    She has no cervical adenopathy.  Neurological: She is alert and oriented to person, place, and time. Coordination normal.  Skin: Skin is warm and dry. Lesion (0.2cm area of erythema, tight on nose, no drainage, TTP) noted. No rash noted. She is not diaphoretic.  Psychiatric: She has a normal mood and affect. Her behavior is normal.  Vitals reviewed.     Assessment & Plan:  Problem List Items Addressed This Visit    None    Visit Diagnoses    COPD exacerbation ([place])    -  Primary   Patient is artery taking clindamycin, added Decadron   Relevant Medications   dexamethasone (DECADRON) 4 MG tablet   Cellulitis of face         Continue clindamycin and added Decadron   Follow up  plan: Return if symptoms worsen or fail to improve.  Counseling provided for all of the vaccine components No orders of the defined types were placed in this encounter.   Caryl Pina, MD [facility] 05/06/2017, 2:07 PM

## 2017-05-07 DIAGNOSIS — N186 End stage renal disease: Secondary | ICD-10-CM | POA: Diagnosis not present

## 2017-05-07 DIAGNOSIS — N250 Renal osteodystrophy: Secondary | ICD-10-CM | POA: Diagnosis not present

## 2017-05-07 DIAGNOSIS — Z992 Dependence on renal dialysis: Secondary | ICD-10-CM | POA: Diagnosis not present

## 2017-05-07 DIAGNOSIS — Z23 Encounter for immunization: Secondary | ICD-10-CM | POA: Diagnosis not present

## 2017-05-07 DIAGNOSIS — D631 Anemia in chronic kidney disease: Secondary | ICD-10-CM | POA: Diagnosis not present

## 2017-05-07 DIAGNOSIS — D509 Iron deficiency anemia, unspecified: Secondary | ICD-10-CM | POA: Diagnosis not present

## 2017-05-09 DIAGNOSIS — Z992 Dependence on renal dialysis: Secondary | ICD-10-CM | POA: Diagnosis not present

## 2017-05-09 DIAGNOSIS — Z23 Encounter for immunization: Secondary | ICD-10-CM | POA: Diagnosis not present

## 2017-05-09 DIAGNOSIS — D631 Anemia in chronic kidney disease: Secondary | ICD-10-CM | POA: Diagnosis not present

## 2017-05-09 DIAGNOSIS — N250 Renal osteodystrophy: Secondary | ICD-10-CM | POA: Diagnosis not present

## 2017-05-09 DIAGNOSIS — D509 Iron deficiency anemia, unspecified: Secondary | ICD-10-CM | POA: Diagnosis not present

## 2017-05-09 DIAGNOSIS — N186 End stage renal disease: Secondary | ICD-10-CM | POA: Diagnosis not present

## 2017-05-11 DIAGNOSIS — D631 Anemia in chronic kidney disease: Secondary | ICD-10-CM | POA: Diagnosis not present

## 2017-05-11 DIAGNOSIS — D509 Iron deficiency anemia, unspecified: Secondary | ICD-10-CM | POA: Diagnosis not present

## 2017-05-11 DIAGNOSIS — N250 Renal osteodystrophy: Secondary | ICD-10-CM | POA: Diagnosis not present

## 2017-05-11 DIAGNOSIS — Z992 Dependence on renal dialysis: Secondary | ICD-10-CM | POA: Diagnosis not present

## 2017-05-11 DIAGNOSIS — Z23 Encounter for immunization: Secondary | ICD-10-CM | POA: Diagnosis not present

## 2017-05-11 DIAGNOSIS — N186 End stage renal disease: Secondary | ICD-10-CM | POA: Diagnosis not present

## 2017-05-13 ENCOUNTER — Encounter: Payer: Self-pay | Admitting: Family Medicine

## 2017-05-13 ENCOUNTER — Other Ambulatory Visit: Payer: Self-pay | Admitting: Family Medicine

## 2017-05-13 ENCOUNTER — Ambulatory Visit (INDEPENDENT_AMBULATORY_CARE_PROVIDER_SITE_OTHER): Payer: Medicare Other | Admitting: Family Medicine

## 2017-05-13 VITALS — BP 162/69 | HR 53 | Temp 97.6°F | Ht 63.0 in | Wt 156.8 lb

## 2017-05-13 DIAGNOSIS — R053 Chronic cough: Secondary | ICD-10-CM

## 2017-05-13 DIAGNOSIS — R05 Cough: Secondary | ICD-10-CM | POA: Diagnosis not present

## 2017-05-13 DIAGNOSIS — Z794 Long term (current) use of insulin: Secondary | ICD-10-CM

## 2017-05-13 DIAGNOSIS — H00011 Hordeolum externum right upper eyelid: Secondary | ICD-10-CM | POA: Diagnosis not present

## 2017-05-13 DIAGNOSIS — E11319 Type 2 diabetes mellitus with unspecified diabetic retinopathy without macular edema: Secondary | ICD-10-CM

## 2017-05-13 DIAGNOSIS — IMO0001 Reserved for inherently not codable concepts without codable children: Secondary | ICD-10-CM

## 2017-05-13 DIAGNOSIS — R3915 Urgency of urination: Secondary | ICD-10-CM

## 2017-05-13 LAB — MICROSCOPIC EXAMINATION: Renal Epithel, UA: NONE SEEN /HPF

## 2017-05-13 LAB — BAYER DCA HB A1C WAIVED: HB A1C: 7.9 % — AB (ref <7.0)

## 2017-05-13 LAB — URINALYSIS, COMPLETE
Bilirubin, UA: NEGATIVE
Ketones, UA: NEGATIVE
Leukocytes, UA: NEGATIVE
NITRITE UA: NEGATIVE
PH UA: 6 (ref 5.0–7.5)
Specific Gravity, UA: 1.02 (ref 1.005–1.030)
Urobilinogen, Ur: 0.2 mg/dL (ref 0.2–1.0)

## 2017-05-13 MED ORDER — INSULIN GLARGINE 100 UNIT/ML SOLOSTAR PEN: 4.0000 [IU] | PEN_INJECTOR | Freq: Every day | SUBCUTANEOUS | 5 refills | Status: DC

## 2017-05-13 MED ORDER — PEN NEEDLES 31G X 5 MM MISC: 1.0000 | Freq: Every day | 99 refills | Status: DC

## 2017-05-13 MED ORDER — GABAPENTIN 100 MG PO CAPS: 100.0000 mg | ORAL_CAPSULE | Freq: Three times a day (TID) | ORAL | 3 refills | Status: DC

## 2017-05-13 NOTE — Progress Notes (Signed)
   HPI  Patient presents today for follow-up diabetes and other chronic medical condition or concerns.  Patient has a very long list today, we discussed the most pertinent items.  Stye Red bump on the right eye present for 2-3 days. She has not tried warm compresses. No change in vision, however she has very poor vision at baseline. Patient has an appointment with ophthalmology in 2 days.  Cough Patient reports chronic cough and requesting lung doctor. She is a smoker, she has had a worse cough recently with an illness, this is improving, however she is maintaining chronic cough.  Urinary urgency For about one week, no dysuria, fever, chills, sweats.  Diabetes. No medications, we have been slowly backing off of her diabetic medications. She is not watching her diet any longer. She is minimally active. Fasting blood sugar  PMH: Smoking status noted ROS: Per HPI  Objective: BP (!) 162/69   Pulse (!) 53   Temp 97.6 F (36.4 C) (Oral)   Ht 5' 3"  (1.6 m)   Wt 156 lb 12.8 oz (71.1 kg)   LMP 05/05/2015   BMI 27.78 kg/m  Gen: NAD, alert, cooperative with exam HEENT: NCAT, right upper eyelid on the medial side has approximately 3 mm swelling consistent with stye CV: RRR, good S1/S2, no murmur Resp: CTABL, no wheezes, non-labored Ext: No edema, warm Neuro: Alert and oriented, No gross deficits    Assessment and plan:  # Diabetes Control worsening, A1c 7.9. Restart Lantus at very low dose, 4 units daily, carefully record fasting blood sugars, return in one month  # Urinary urgency Urinalysis without convincing signs of UTI, urine culture pending  # Chronic cough Patient reporting chronic cough and long-term smoking Refer to pulmonology for her request, appreciate the recommendations and evaluation Possible underlying COPD  # Stye, hordeolum Discussed conservative therapy, she does have ophthalmology follow-up in 2 days if this is not improved her  symptoms.     Orders Placed This Encounter  Procedures  . Urine Culture  . Microscopic Examination  . Urinalysis, Complete  . Bayer DCA Hb A1c Waived  . CMP14+EGFR  . CBC with Differential/Platelet  . Ambulatory referral to Pulmonology    Referral Priority:   Routine    Referral Type:   Consultation    Referral Reason:   Specialty Services Required    Referred to Provider:   Sinda Du, MD    Requested Specialty:   Pulmonary Disease    Number of Visits Requested:   1    Meds ordered this encounter  Medications  . DISCONTD: gabapentin (NEURONTIN) 100 MG capsule    Sig: Take 100 mg by mouth 3 (three) times daily.  Marland Kitchen gabapentin (NEURONTIN) 100 MG capsule    Sig: Take 1 capsule (100 mg total) by mouth 3 (three) times daily.    Dispense:  90 capsule    Refill:  3  . Insulin Glargine (LANTUS SOLOSTAR) 100 UNIT/ML Solostar Pen    Sig: Inject 4 Units into the skin daily at 10 pm.    Dispense:  1 pen    Refill:  5  . Insulin Pen Needle (PEN NEEDLES) 31G X 5 MM MISC    Sig: 1 Device by Does not apply route daily.    Dispense:  100 each    Refill:  PRN    [name], MD [facility] 05/13/2017, 4:42 PM

## 2017-05-13 NOTE — Patient Instructions (Signed)
Great to see you!  Start lantus 4 units once daily, come back with your fasting blood sugars in 3-4 weeks  We have referred you to Dr. Luan Pulling, a good lung doctor in [place].   Try a warm rag 4 times a day to the eyelid to help your Stye get better.

## 2017-05-14 DIAGNOSIS — Z992 Dependence on renal dialysis: Secondary | ICD-10-CM | POA: Diagnosis not present

## 2017-05-14 DIAGNOSIS — D631 Anemia in chronic kidney disease: Secondary | ICD-10-CM | POA: Diagnosis not present

## 2017-05-14 DIAGNOSIS — N186 End stage renal disease: Secondary | ICD-10-CM | POA: Diagnosis not present

## 2017-05-14 DIAGNOSIS — Z23 Encounter for immunization: Secondary | ICD-10-CM | POA: Diagnosis not present

## 2017-05-14 DIAGNOSIS — N250 Renal osteodystrophy: Secondary | ICD-10-CM | POA: Diagnosis not present

## 2017-05-14 DIAGNOSIS — D509 Iron deficiency anemia, unspecified: Secondary | ICD-10-CM | POA: Diagnosis not present

## 2017-05-14 LAB — CMP14+EGFR
ALK PHOS: 236 IU/L — AB (ref 39–117)
ALT: 12 IU/L (ref 0–32)
AST: 17 IU/L (ref 0–40)
Albumin/Globulin Ratio: 1.4 (ref 1.2–2.2)
Albumin: 3.5 g/dL (ref 3.5–5.5)
BUN / CREAT RATIO: 9 (ref 9–23)
BUN: 36 mg/dL — ABNORMAL HIGH (ref 6–24)
Bilirubin Total: 0.4 mg/dL (ref 0.0–1.2)
CHLORIDE: 90 mmol/L — AB (ref 96–106)
CO2: 23 mmol/L (ref 20–29)
Calcium: 8.8 mg/dL (ref 8.7–10.2)
Creatinine, Ser: 3.81 mg/dL (ref 0.57–1.00)
GFR calc non Af Amer: 13 mL/min/{1.73_m2} — ABNORMAL LOW (ref >59)
GFR, EST AFRICAN AMERICAN: 15 mL/min/{1.73_m2} — AB (ref >59)
GLUCOSE: 500 mg/dL — AB (ref 65–99)
Globulin, Total: 2.5 g/dL (ref 1.5–4.5)
POTASSIUM: 4.4 mmol/L (ref 3.5–5.2)
SODIUM: 128 mmol/L — AB (ref 134–144)
Total Protein: 6 g/dL (ref 6.0–8.5)

## 2017-05-14 LAB — CBC WITH DIFFERENTIAL/PLATELET
BASOS ABS: 0 10*3/uL (ref 0.0–0.2)
Basos: 0 %
EOS (ABSOLUTE): 0.1 10*3/uL (ref 0.0–0.4)
Eos: 1 %
Hematocrit: 36.6 % (ref 34.0–46.6)
Hemoglobin: 11.7 g/dL (ref 11.1–15.9)
Immature Grans (Abs): 0.1 10*3/uL (ref 0.0–0.1)
Immature Granulocytes: 1 %
LYMPHS ABS: 1.4 10*3/uL (ref 0.7–3.1)
LYMPHS: 12 %
MCH: 32 pg (ref 26.6–33.0)
MCHC: 32 g/dL (ref 31.5–35.7)
MCV: 100 fL — ABNORMAL HIGH (ref 79–97)
Monocytes Absolute: 0.9 10*3/uL (ref 0.1–0.9)
Monocytes: 7 %
NEUTROS ABS: 9.8 10*3/uL — AB (ref 1.4–7.0)
Neutrophils: 79 %
PLATELETS: 227 10*3/uL (ref 150–379)
RBC: 3.66 x10E6/uL — ABNORMAL LOW (ref 3.77–5.28)
RDW: 14.5 % (ref 12.3–15.4)
WBC: 12.3 10*3/uL — ABNORMAL HIGH (ref 3.4–10.8)

## 2017-05-15 DIAGNOSIS — H4051X3 Glaucoma secondary to other eye disorders, right eye, severe stage: Secondary | ICD-10-CM | POA: Diagnosis not present

## 2017-05-15 DIAGNOSIS — H35371 Puckering of macula, right eye: Secondary | ICD-10-CM | POA: Diagnosis not present

## 2017-05-15 DIAGNOSIS — E113511 Type 2 diabetes mellitus with proliferative diabetic retinopathy with macular edema, right eye: Secondary | ICD-10-CM | POA: Diagnosis not present

## 2017-05-15 DIAGNOSIS — E113592 Type 2 diabetes mellitus with proliferative diabetic retinopathy without macular edema, left eye: Secondary | ICD-10-CM | POA: Diagnosis not present

## 2017-05-15 LAB — URINE CULTURE

## 2017-05-16 ENCOUNTER — Other Ambulatory Visit: Payer: Self-pay | Admitting: Family Medicine

## 2017-05-16 ENCOUNTER — Other Ambulatory Visit: Payer: Self-pay | Admitting: Gastroenterology

## 2017-05-16 ENCOUNTER — Telehealth: Payer: Self-pay | Admitting: Family Medicine

## 2017-05-16 DIAGNOSIS — N186 End stage renal disease: Secondary | ICD-10-CM | POA: Diagnosis not present

## 2017-05-16 DIAGNOSIS — Z992 Dependence on renal dialysis: Secondary | ICD-10-CM | POA: Diagnosis not present

## 2017-05-16 DIAGNOSIS — D509 Iron deficiency anemia, unspecified: Secondary | ICD-10-CM | POA: Diagnosis not present

## 2017-05-16 DIAGNOSIS — N250 Renal osteodystrophy: Secondary | ICD-10-CM | POA: Diagnosis not present

## 2017-05-16 DIAGNOSIS — D631 Anemia in chronic kidney disease: Secondary | ICD-10-CM | POA: Diagnosis not present

## 2017-05-16 DIAGNOSIS — Z23 Encounter for immunization: Secondary | ICD-10-CM | POA: Diagnosis not present

## 2017-05-16 MED ORDER — DIPHENOXYLATE-ATROPINE 2.5-0.025 MG PO TABS: 1.0000 | ORAL_TABLET | Freq: Four times a day (QID) | ORAL | 0 refills | Status: DC | PRN

## 2017-05-16 MED ORDER — CIPROFLOXACIN HCL 250 MG PO TABS: 250.0000 mg | ORAL_TABLET | Freq: Every day | ORAL | 0 refills | Status: DC

## 2017-05-16 NOTE — Telephone Encounter (Signed)
Rx called into pharmacy and pt is aware.

## 2017-05-16 NOTE — Telephone Encounter (Signed)
Lomotil prescribed.   [name], MD [facility] 05/16/2017, 12:37 PM

## 2017-05-16 NOTE — Telephone Encounter (Signed)
Unclear what request is for, will ask for clarification.   [name], MD [facility] 05/16/2017, 10:29 AM

## 2017-05-16 NOTE — Telephone Encounter (Signed)
Pt wants Immodium and she said you were going to send it in to the pharmacy when she was seen with you. She is having diarrhea.

## 2017-05-18 DIAGNOSIS — Z23 Encounter for immunization: Secondary | ICD-10-CM | POA: Diagnosis not present

## 2017-05-18 DIAGNOSIS — Z992 Dependence on renal dialysis: Secondary | ICD-10-CM | POA: Diagnosis not present

## 2017-05-18 DIAGNOSIS — N186 End stage renal disease: Secondary | ICD-10-CM | POA: Diagnosis not present

## 2017-05-18 DIAGNOSIS — D509 Iron deficiency anemia, unspecified: Secondary | ICD-10-CM | POA: Diagnosis not present

## 2017-05-18 DIAGNOSIS — N250 Renal osteodystrophy: Secondary | ICD-10-CM | POA: Diagnosis not present

## 2017-05-18 DIAGNOSIS — D631 Anemia in chronic kidney disease: Secondary | ICD-10-CM | POA: Diagnosis not present

## 2017-05-21 DIAGNOSIS — Z992 Dependence on renal dialysis: Secondary | ICD-10-CM | POA: Diagnosis not present

## 2017-05-21 DIAGNOSIS — Z23 Encounter for immunization: Secondary | ICD-10-CM | POA: Diagnosis not present

## 2017-05-21 DIAGNOSIS — N186 End stage renal disease: Secondary | ICD-10-CM | POA: Diagnosis not present

## 2017-05-21 DIAGNOSIS — N250 Renal osteodystrophy: Secondary | ICD-10-CM | POA: Diagnosis not present

## 2017-05-21 DIAGNOSIS — D509 Iron deficiency anemia, unspecified: Secondary | ICD-10-CM | POA: Diagnosis not present

## 2017-05-21 DIAGNOSIS — D631 Anemia in chronic kidney disease: Secondary | ICD-10-CM | POA: Diagnosis not present

## 2017-05-22 ENCOUNTER — Other Ambulatory Visit: Payer: Self-pay | Admitting: *Deleted

## 2017-05-22 ENCOUNTER — Encounter: Payer: Self-pay | Admitting: Gastroenterology

## 2017-05-22 ENCOUNTER — Ambulatory Visit (INDEPENDENT_AMBULATORY_CARE_PROVIDER_SITE_OTHER): Payer: Medicare Other | Admitting: Gastroenterology

## 2017-05-22 DIAGNOSIS — N186 End stage renal disease: Secondary | ICD-10-CM | POA: Diagnosis not present

## 2017-05-22 DIAGNOSIS — K529 Noninfective gastroenteritis and colitis, unspecified: Secondary | ICD-10-CM

## 2017-05-22 DIAGNOSIS — R634 Abnormal weight loss: Secondary | ICD-10-CM

## 2017-05-22 DIAGNOSIS — R1310 Dysphagia, unspecified: Secondary | ICD-10-CM

## 2017-05-22 DIAGNOSIS — K625 Hemorrhage of anus and rectum: Secondary | ICD-10-CM

## 2017-05-22 DIAGNOSIS — Z992 Dependence on renal dialysis: Secondary | ICD-10-CM | POA: Diagnosis not present

## 2017-05-22 DIAGNOSIS — E8770 Fluid overload, unspecified: Secondary | ICD-10-CM | POA: Diagnosis not present

## 2017-05-22 DIAGNOSIS — R1319 Other dysphagia: Secondary | ICD-10-CM

## 2017-05-22 NOTE — Assessment & Plan Note (Signed)
SYMPTOMS CONTROLLED/RESOLVED SINCE SHE IS NO LONGER HAVING A CONSTIPATION.  USE PREPARATION H FOUR TIMES  A DAY IF NEEDED TO RELIEVE RECTAL PAIN/PRESSURE/BLEEDING. PLEASE CALL WITH QUESTIONS OR CONCERNS.  FOLLOW UP IN 4 MOS.

## 2017-05-22 NOTE — Assessment & Plan Note (Signed)
SYMPTOMS NOT CONTROLLED AND WEIGHT DOWN 8 LBS IN SPITE OF FLUID OVERLOAD.  TO PREVENT WEIGHT LOSS, DRINK RESOURCE FRUIT BEVERAGE THREE TO FOUR CANS A DAY. REFER TO [facility] FOR A SWALLOWING EVALUATION/NPV. PLEASE CALL WITH QUESTIONS OR CONCERNS.  FOLLOW UP IN 4 MOS.

## 2017-05-22 NOTE — Progress Notes (Signed)
Subjective:    Patient ID: Kara Knapp, female    DOB: Feb 03, 1971, 46 y.o.   MRN: [id number]  Timmothy Euler, MD   HPI 2-3 weeks ago hurt when she swallowed real bad and chest was hurting. Food NOT GOING DOWN AND SOMETIMES CAN'T EAT. HAD EGD/DIL MAR 2018 AND SWALLOWING GOT BETTER UNTIL 2- WEEKS AGO. ALWAYS COUGHS WHEN SHE EATS. FOOD NOR LIQUID COMES BACK THROUGH HER NOSE.MAY REGURGITATE FOOD. HAD BPE APR 2018 AND SHOWED ESOPHAGEAL MOTILITY DISORDER. CONTINUES TO HAVE PROBLEMS WITH SWALLOWING. BMs: DOING PRETTY GOOD LATELY. YESTERDAY TWO SOFT STOOLS BUT HAD AN ACCIDENT. HERE LATELY NO CONSTIPATION. DIARRHEA: ONCE  MO NOW. KEEP SUNDER CONTROL AND TAKES IMODIUM THE NIGHT PRIOR TO DIALYSIS. BELLY SWELLS ALL THE TIME. LEAVING TO GO TO DIALYSIS. SWELLING IN [place]. UNABLE TO SEE COLOR OF STOOLS BUT FRIEND SAID BROWN STOOL LATELY. EATS EGGS AND LIKES THE YELLOW PART. DRY WEIGHT: 65KG.   PT DENIES FEVER, CHILLS, HEMATOCHEZIA, HEMATEMESIS, nausea, vomiting, melena, CHEST PAIN, SHORTNESS OF BREATH,  CHANGE IN BOWEL IN HABITS, abdominal pain, problems with sedation, OR heartburn or indigestion.  Past Medical History:  Diagnosis Date  . Anemia of chronic disease   . Asthma   . Blind left eye   . Bronchitis   . Cataract   . Cholecystitis, acute 05/26/2013   Status post cholecystectomy  . Depression   . Diabetic foot ulcer ([place]) 03/01/2015  . Diastolic heart failure ([place])   . ESRD on hemodialysis ([place])    Started diaylsis 12/29/15  . Essential hypertension   . Fibroids   . Glaucoma   . History of blood transfusion   . History of pneumonia   . Hyperlipidemia   . Insulin-dependent diabetes mellitus with retinopathy ([place])   . Neuropathy   . Osteomyelitis ([place])    Toe on left foot   Past Surgical History:  Procedure Laterality Date  . A/V SHUNTOGRAM N/A 10/25/2016   Procedure: A/V Shuntogram - Right Arm;  Surgeon: Waynetta Sandy, MD;  Location: [facility];  Service:  Cardiovascular;  Laterality: N/A;  . AV FISTULA PLACEMENT Right 10/17/2015   Procedure: INSERTION OF ARTERIOVENOUS GORE-TEX GRAFT RIGHT UPPER ARM WITH ACUSEAL;  Surgeon: Conrad Midway, MD;  Location: [facility];  Service: Vascular;  Laterality: Right;  . CATARACT EXTRACTION W/ INTRAOCULAR LENS IMPLANT Bilateral   . CESAREAN SECTION    . CHOLECYSTECTOMY N/A 05/25/2013   Procedure: LAPAROSCOPIC CHOLECYSTECTOMY;  Surgeon: Jamesetta So, MD;  Location: [facility];  Service: General;  Laterality: N/A;  . COLONOSCOPY WITH PROPOFOL N/A 10/02/2016   Procedure: COLONOSCOPY WITH PROPOFOL;  Surgeon: Danie Binder, MD;  [facility];  Service: Endoscopy;  Laterality: N/A;  145 - pt knows to arrive at 11:15 per office  . ESOPHAGOGASTRODUODENOSCOPY (EGD) WITH PROPOFOL N/A 10/02/2016   Procedure: ESOPHAGOGASTRODUODENOSCOPY (EGD) WITH PROPOFOL;  Surgeon: Danie Binder, MD;  [facility];  Service: Endoscopy;  Laterality: N/A;  . EYE SURGERY    . PARS PLANA VITRECTOMY Left 11/24/2014   Procedure: PARS PLANA VITRECTOMY WITH 25 GAUGE;  Surgeon: Hurman Horn, MD;  Location: [facility];  Service: Ophthalmology;  Laterality: Left;  . PERIPHERAL VASCULAR CATHETERIZATION N/A 04/28/2015   Procedure: Bilateral Upper Extremity Venography;  Surgeon: Conrad East Burke, MD;  Location: [facility];  Service: Cardiovascular;  Laterality: N/A;  . PHOTOCOAGULATION WITH LASER Left 11/24/2014   Procedure: PHOTOCOAGULATION WITH LASER;  Surgeon: Hurman Horn, MD;  Location: [facility];  Service: Ophthalmology;  Laterality: Left;  with insertion of silicone oil  . SAVORY DILATION N/A 10/02/2016   Procedure: SAVORY DILATION;  Surgeon: Danie Binder, MD;  [facility];  Service: Endoscopy;  Laterality: N/A;  . TUBAL LIGATION      Allergies  Allergen Reactions  . Bactrim [Sulfamethoxazole-Trimethoprim] Nausea And Vomiting  . Prednisone Other (See Comments)    "I was wide open and couldn't eat" per pt.    Current  Outpatient Prescriptions  Medication Sig Dispense Refill  . acetaminophen (TYLENOL) 325 MG tablet Take 650 mg by mouth every 6 (six) hours as needed for mild pain.    Marland Kitchen aspirin 81 MG tablet Take 81 mg by mouth daily.    . carvedilol (COREG) 12.5 MG tablet TAKE ONE TABLET BY MOUTH TWICE DAILY    . cholecalciferol (VITAMIN D) 1000 units tablet Take 1,000 Units by mouth daily.     . ciprofloxacin (CIPRO) 250 MG tablet Take 1 tablet (250 mg total) by mouth daily.    . citalopram (CELEXA) 20 MG tablet TAKE ONE TABLET BY MOUTH DAILY.    Marland Kitchen dexamethasone 4 MG tablet Take 1 tablet (4 mg total) by mouth 2 (two) times daily with a meal.    . dicyclomine (BENTYL) 10 MG capsule 2-3 A DAY    . diphenoxylate-atropine (LOMOTIL) 2.5-0.025 MG tablet Take 1-2 tablets by mouth 4 (four) times daily as needed for diarrhea or loose stools.    . fluticasone (FLONASE) 50 MCG/ACT nasal spray Place 2 sprays into both nostrils daily as needed for allergies.    Marland Kitchen gabapentin (NEURONTIN) 100 MG capsule Take 1 capsule (100 mg total) by mouth 3 (three) times daily.    .      . hydrALAZINE (APRESOLINE) 25 MG tablet Take 1 tablet (25 mg total) by mouth 3 (three) times daily. (Patient taking differently: Take 25 mg by mouth 2 (two) times daily. )    . hydrocortisone (ANUSOL-HC) 2.5 % rectal cream Place 1 application rectally 2 (two) times daily. (Patient taking differently: Place 1 application rectally as needed. )    . Insulin Glargine (LANTUS SOLOSTAR) 100 UNIT/ML Solostar Pen Inject 4 Units into the skin daily at 10 pm.    . Insulin Pen Needle (PEN NEEDLES) 31G X 5 MM MISC 1 Device by Does not apply route daily.    Marland Kitchen BACTROBAN     .      Marland Kitchen PRODIGY TWIST TOP LANCETS 28G MISC USE TO CHECK BLOOD SUGAR UP TO FOUR TIMES DAILY.    Marland Kitchen PROTONIX 40 MG tablet TAKE 1 BY MOUTH DAILY     RENVALA Take 2,400 mg TID AND WITH snacks.    . traZODone (DESYREL) 50 MG tablet Take 0.5-1 tablets (25-50 mg total) QHS as needed for sleep.    .         Review of Systems PER HPI OTHERWISE ALL SYSTEMS ARE NEGATIVE.    Objective:   Physical Exam  Constitutional: She is oriented to person, place, and time. She appears well-developed and well-nourished. No distress.  HENT:  Head: Normocephalic and atraumatic.  Mouth/Throat: Oropharynx is clear and moist. No oropharyngeal exudate.  Eyes: Pupils are equal, round, and reactive to light. No scleral icterus.  Neck: Normal range of motion. Neck supple.  Cardiovascular: Normal rate and regular rhythm.   Murmur heard. Pulmonary/Chest: Effort normal and breath sounds normal. No respiratory distress.  Abdominal: Soft. Bowel sounds are normal. She exhibits  distension. There is no tenderness.  MODERATE, NOT TENSE  Musculoskeletal: She exhibits edema.  R(TRACE)> LEFT LOWER EXTREMITY(1-2+)  Lymphadenopathy:    She has no cervical adenopathy.  Neurological: She is alert and oriented to person, place, and time.  NO  NEW FOCAL DEFICITS. PT IS BLIND.  Psychiatric: She has a normal mood and affect.  Vitals reviewed.     Assessment & Plan:

## 2017-05-22 NOTE — Progress Notes (Signed)
ON RECALL  °

## 2017-05-22 NOTE — Progress Notes (Signed)
cc'ed to pcp °

## 2017-05-22 NOTE — Assessment & Plan Note (Signed)
SYMPTOMS FAIRLY WELL CONTROLLED WITH DICYCLOMINE AND IMODIUM.  TAKE A PROBIOTIC DAILY ([facility] BRAND, [place]). GET A GENERIC BRAND WITH THE HIGHEST COLONY COUNT PER PILL(3-6 MILLION PER PILL) CONTINUE DICYCLOMINE AND IMODIUM. PLEASE CALL WITH QUESTIONS OR CONCERNS.  FOLLOW UP IN 4 MOS.

## 2017-05-22 NOTE — Patient Instructions (Addendum)
TO PREVENT WEIGHT LOSS, DRINK RESOURCE FRUIT BEVERAGE THREE TO FOUR CANS A DAY.  I AM REFERRING YOU TO [facility] FOR A SWALLOWING EVALUATION.  TAKE A PROBIOTIC DAILY ([facility] BRAND, [place]). GET A GENERIC BRAND WITH THE HIGHEST COLONY COUNT PER PILL(3-6 MILLION PER PILL)  CONTINUE DICYCLOMINE AND IMODIUM.  USE PREPARATION H FOUR TIMES  A DAY IF NEEDED TO RELIEVE RECTAL PAIN/PRESSURE/BLEEDING.   PLEASE CALL WITH QUESTIONS OR CONCERNS.  FOLLOW UP IN 4 MOS.

## 2017-05-23 ENCOUNTER — Ambulatory Visit: Payer: Self-pay | Admitting: Gastroenterology

## 2017-05-23 ENCOUNTER — Encounter (HOSPITAL_COMMUNITY): Payer: Self-pay | Admitting: Emergency Medicine

## 2017-05-23 ENCOUNTER — Emergency Department (HOSPITAL_COMMUNITY)
Admission: EM | Admit: 2017-05-23 | Discharge: 2017-05-23 | Disposition: A | Discharge status: Home / Self Care | Payer: Medicare Other | Attending: Emergency Medicine | Admitting: Emergency Medicine

## 2017-05-23 DIAGNOSIS — Z794 Long term (current) use of insulin: Secondary | ICD-10-CM | POA: Diagnosis not present

## 2017-05-23 DIAGNOSIS — Z7982 Long term (current) use of aspirin: Secondary | ICD-10-CM | POA: Diagnosis not present

## 2017-05-23 DIAGNOSIS — I132 Hypertensive heart and chronic kidney disease with heart failure and with stage 5 chronic kidney disease, or end stage renal disease: Secondary | ICD-10-CM | POA: Diagnosis not present

## 2017-05-23 DIAGNOSIS — L0201 Cutaneous abscess of face: Secondary | ICD-10-CM | POA: Insufficient documentation

## 2017-05-23 DIAGNOSIS — L03211 Cellulitis of face: Secondary | ICD-10-CM | POA: Diagnosis not present

## 2017-05-23 DIAGNOSIS — N186 End stage renal disease: Secondary | ICD-10-CM | POA: Insufficient documentation

## 2017-05-23 DIAGNOSIS — F17210 Nicotine dependence, cigarettes, uncomplicated: Secondary | ICD-10-CM | POA: Diagnosis not present

## 2017-05-23 DIAGNOSIS — Z992 Dependence on renal dialysis: Secondary | ICD-10-CM | POA: Insufficient documentation

## 2017-05-23 DIAGNOSIS — L0291 Cutaneous abscess, unspecified: Secondary | ICD-10-CM

## 2017-05-23 DIAGNOSIS — E10319 Type 1 diabetes mellitus with unspecified diabetic retinopathy without macular edema: Secondary | ICD-10-CM | POA: Diagnosis not present

## 2017-05-23 DIAGNOSIS — I509 Heart failure, unspecified: Secondary | ICD-10-CM | POA: Diagnosis not present

## 2017-05-23 DIAGNOSIS — D649 Anemia, unspecified: Secondary | ICD-10-CM | POA: Insufficient documentation

## 2017-05-23 DIAGNOSIS — R600 Localized edema: Secondary | ICD-10-CM | POA: Diagnosis present

## 2017-05-23 LAB — CBC
HEMATOCRIT: 32.5 % — AB (ref 36.0–46.0)
Hemoglobin: 10.7 g/dL — ABNORMAL LOW (ref 12.0–15.0)
MCH: 32.7 pg (ref 26.0–34.0)
MCHC: 32.9 g/dL (ref 30.0–36.0)
MCV: 99.4 fL (ref 78.0–100.0)
Platelets: 171 10*3/uL (ref 150–400)
RBC: 3.27 MIL/uL — AB (ref 3.87–5.11)
RDW: 15.1 % (ref 11.5–15.5)
WBC: 7.3 10*3/uL (ref 4.0–10.5)

## 2017-05-23 MED ORDER — HYDROCODONE-ACETAMINOPHEN 5-325 MG PO TABS: 2.0000 | ORAL_TABLET | ORAL | 0 refills | Status: DC | PRN

## 2017-05-23 MED ORDER — CLINDAMYCIN HCL 150 MG PO CAPS
300.0000 mg | ORAL_CAPSULE | Freq: Once | ORAL | Status: AC
Start: 2017-05-23 — End: 2017-05-23
  Administered 2017-05-23: 300 mg via ORAL
  Filled 2017-05-23: qty 2

## 2017-05-23 MED ORDER — LIDOCAINE-EPINEPHRINE (PF) 1 %-1:200000 IJ SOLN
10.0000 mL | Freq: Once | INTRAMUSCULAR | Status: AC
  Administered 2017-05-23: 10 mL
  Filled 2017-05-23: qty 30

## 2017-05-23 MED ORDER — HYDROCODONE-ACETAMINOPHEN 5-325 MG PO TABS
1.0000 | ORAL_TABLET | Freq: Once | ORAL | Status: AC
  Administered 2017-05-23: 1 via ORAL
  Filled 2017-05-23: qty 1

## 2017-05-23 MED ORDER — CLINDAMYCIN HCL 300 MG PO CAPS: 300.0000 mg | ORAL_CAPSULE | Freq: Three times a day (TID) | ORAL | 0 refills | Status: DC

## 2017-05-23 NOTE — ED Provider Notes (Signed)
[facility] EMERGENCY DEPARTMENT Provider Note   CSN: [id number] Arrival date & time: 05/23/17  1123     History   Chief Complaint Chief Complaint  Patient presents with  . Abscess  . Nausea    HPI Maliah A Corbo is a 46 y.o. female. Chief complaint is forehead abscess, and right upper eyelid swelling. Chief complaint is forehead abscess, and right upper eyelid swelling.  HPI: 46 year old female. End stage renal disease. On 3 times weekly maintenance dialysis, Tuesday, Thursday, Saturday. Missed today because she came here. His artery made arrangements for tomorrow.  Complains of a tender painful spot "an abscess" in her right hairline above her right fore head. Awakened this morning and right eyelid was swollen and she presents here.  Past Medical History:  Diagnosis Date  . Anemia of chronic disease   . Asthma   . Blind left eye   . Bronchitis   . Cataract   . Cholecystitis, acute 05/26/2013   Status post cholecystectomy  . Depression   . Diabetic foot ulcer ([place]) 03/01/2015  . Diastolic heart failure ([place])   . ESRD on hemodialysis ([place])    Started diaylsis 12/29/15  . Essential hypertension   . Fibroids   . Glaucoma   . History of blood transfusion   . History of pneumonia   . Hyperlipidemia   . Insulin-dependent diabetes mellitus with retinopathy ([place])   . Neuropathy   . Osteomyelitis ([place])    Toe on left foot    Patient Active Problem List   Diagnosis Date Noted  . Encounter for well woman exam with routine gynecological exam 01/14/2017  . Ascites 12/21/2016  . Fatty liver 11/08/2016  . Rectal bleeding 09/07/2016  . Legally blind 09/06/2016  . Hepatomegaly 05/04/2016  . Abnormal LFTs 05/04/2016  . Chronic diarrhea 05/04/2016  . Esophageal dysphagia 05/04/2016  . Anemia 05/04/2016  . Mood disorder ([place]) 08/12/2015  . Fibroid, uterine 01/24/2015  . Iron deficiency anemia due to chronic blood loss 01/24/2015  . Menorrhagia with irregular cycle  01/24/2015  . Chronic diastolic CHF (congestive heart failure) ([place]) 01/21/2015  . Insulin-dependent diabetes mellitus with retinopathy ([place]) 01/21/2015  . Diabetic neuropathy ([place]) 01/21/2015  . ESRD on dialysis ([place]) 01/21/2015  . Anemia of chronic disease 01/21/2015  . Tobacco abuse 01/21/2015  . Cholecystitis, acute 05/26/2013  . Essential hypertension, benign 05/25/2013    Past Surgical History:  Procedure Laterality Date  . A/V SHUNTOGRAM N/A 10/25/2016   Procedure: A/V Shuntogram - Right Arm;  Surgeon: Waynetta Sandy, MD;  Location: [place] [facility];  Service: Cardiovascular;  Laterality: N/A;  . AV FISTULA PLACEMENT Right 10/17/2015   Procedure: INSERTION OF ARTERIOVENOUS GORE-TEX GRAFT RIGHT UPPER ARM WITH ACUSEAL;  Surgeon: Conrad Powderly, MD;  Location: [place];  Service: Vascular;  Laterality: Right;  . CATARACT EXTRACTION W/ INTRAOCULAR LENS IMPLANT Bilateral   . CESAREAN SECTION    . CHOLECYSTECTOMY N/A 05/25/2013   Procedure: LAPAROSCOPIC CHOLECYSTECTOMY;  Surgeon: Jamesetta So, MD;  Location: [facility];  Service: General;  Laterality: N/A;  . COLONOSCOPY WITH PROPOFOL N/A 10/02/2016   Procedure: COLONOSCOPY WITH PROPOFOL;  Surgeon: Danie Binder, MD;  [facility];  Service: Endoscopy;  Laterality: N/A;  145 - pt knows to arrive at 11:15 per office  . ESOPHAGOGASTRODUODENOSCOPY (EGD) WITH PROPOFOL N/A 10/02/2016   Procedure: ESOPHAGOGASTRODUODENOSCOPY (EGD) WITH PROPOFOL;  Surgeon: Danie Binder, MD;  [facility];  Service: Endoscopy;  Laterality: N/A;  .  EYE SURGERY    . PARS PLANA VITRECTOMY Left 11/24/2014   Procedure: PARS PLANA VITRECTOMY WITH 25 GAUGE;  Surgeon: Hurman Horn, MD;  Location: [place];  Service: Ophthalmology;  Laterality: Left;  . PERIPHERAL VASCULAR CATHETERIZATION N/A 04/28/2015   Procedure: Bilateral Upper Extremity Venography;  Surgeon: Conrad , MD;  Location: [place] [facility];  Service: Cardiovascular;  Laterality:  N/A;  . PHOTOCOAGULATION WITH LASER Left 11/24/2014   Procedure: PHOTOCOAGULATION WITH LASER;  Surgeon: Hurman Horn, MD;  Location: [place];  Service: Ophthalmology;  Laterality: Left;  with insertion of silicone oil  . SAVORY DILATION N/A 10/02/2016   Procedure: SAVORY DILATION;  Surgeon: Danie Binder, MD;  [facility];  Service: Endoscopy;  Laterality: N/A;  . TUBAL LIGATION      OB History    Gravida Para Term Preterm AB Living   5 5           SAB TAB Ectopic Multiple Live Births                   Home Medications    Prior to Admission medications   Medication Sig Start Date End Date Taking? Authorizing Provider  acetaminophen (TYLENOL) 325 MG tablet Take 650 mg by mouth every 6 (six) hours as needed for mild pain.    [provider]  aspirin 81 MG tablet Take 81 mg by mouth daily.    [provider]  carvedilol (COREG) 12.5 MG tablet TAKE ONE TABLET BY MOUTH TWICE DAILY 03/13/17   Timmothy Euler, MD  cholecalciferol (VITAMIN D) 1000 units tablet Take 1,000 Units by mouth daily.     [provider]  ciprofloxacin (CIPRO) 250 MG tablet Take 1 tablet (250 mg total) by mouth daily. 05/16/17   Timmothy Euler, MD  citalopram (CELEXA) 20 MG tablet TAKE ONE TABLET BY MOUTH DAILY. 05/08/17   Dettinger, Fransisca Kaufmann, MD  clindamycin (CLEOCIN) 300 MG capsule Take 1 capsule (300 mg total) by mouth 3 (three) times daily. 05/23/17   Tanna Furry, MD  dexamethasone (DECADRON) 4 MG tablet Take 1 tablet (4 mg total) by mouth 2 (two) times daily with a meal. 05/06/17   Dettinger, Fransisca Kaufmann, MD  dicyclomine (BENTYL) 10 MG capsule TAKE ONE CAPSULE BY MOUTH FOUR TIMES DAILY BEFORE MEALS AND AT BEDTIME AS NEEDED FOR DIARRHEA, HOLD FOR CONSTIPATION. 03/12/17   Mahala Menghini, PA-C  diphenoxylate-atropine (LOMOTIL) 2.5-0.025 MG tablet Take 1-2 tablets by mouth 4 (four) times daily as needed for diarrhea or loose stools. 05/16/17   Timmothy Euler, MD  fluticasone  (FLONASE) 50 MCG/ACT nasal spray Place 2 sprays into both nostrils daily as needed for allergies. 09/05/16   Dettinger, Fransisca Kaufmann, MD  gabapentin (NEURONTIN) 100 MG capsule Take 1 capsule (100 mg total) by mouth 3 (three) times daily. 05/13/17   Timmothy Euler, MD  glucose blood (PRODIGY NO CODING BLOOD GLUC) test strip Test twice daily 12/10/16   Timmothy Euler, MD  hydrALAZINE (APRESOLINE) 25 MG tablet Take 1 tablet (25 mg total) by mouth 3 (three) times daily. Patient taking differently: Take 25 mg by mouth 2 (two) times daily.  07/08/15   Dettinger, Fransisca Kaufmann, MD  HYDROcodone-acetaminophen (NORCO/VICODIN) 5-325 MG tablet Take 2 tablets by mouth every 4 (four) hours as needed. 05/23/17   Tanna Furry, MD  hydrocortisone (ANUSOL-HC) 2.5 % rectal cream Place 1 application rectally 2 (two) times daily. Patient taking differently: Place 1  application rectally as needed.  04/05/17   Annitta Needs, NP  Insulin Glargine (LANTUS SOLOSTAR) 100 UNIT/ML Solostar Pen Inject 4 Units into the skin daily at 10 pm. 05/13/17   Timmothy Euler, MD  Insulin Pen Needle (PEN NEEDLES) 31G X 5 MM MISC 1 Device by Does not apply route daily. 05/13/17   Timmothy Euler, MD  mupirocin ointment (BACTROBAN) 2 % Place 1 application into the nose 2 (two) times daily. For 5 days Patient taking differently: Place 1 application into the nose as needed. For 5 days 03/11/17   Timmothy Euler, MD  polyethylene glycol powder (GLYCOLAX/MIRALAX) powder TAKE 17 GRAMS (1 CAPFUL) IN 8 OZ OF FLUID ONCE DAILY. AS NEEDED FOR CONSTIPATION 05/21/17   Carlis Stable, NP  Probiotic Product (RESTORA) CAPS Take 1 capsule by mouth daily. Patient not taking: Reported on 05/22/2017 04/16/17   Annitta Needs, NP  PRODIGY TWIST TOP LANCETS 28G MISC USE TO CHECK BLOOD SUGAR UP TO FOUR TIMES DAILY. 03/13/17   Timmothy Euler, MD  PROTONIX 40 MG tablet TAKE 1 BY MOUTH DAILY 01/18/17   Carlis Stable, NP  sevelamer carbonate (RENVELA) 800 MG tablet Take  2,400 mg by mouth 3 (three) times daily with meals. And with snacks.    [provider]  traZODone (DESYREL) 50 MG tablet Take 0.5-1 tablets (25-50 mg total) by mouth at bedtime as needed for sleep. 01/07/17   Timmothy Euler, MD    Family History Family History  Problem Relation Age of Onset  . COPD Mother   . Cancer Father   . Lymphoma Father   . Diabetes Sister   . Deep vein thrombosis Sister   . Diabetes Brother   . Hyperlipidemia Brother   . Hypertension Brother   . Mental retardation Sister   . Colon cancer Neg Hx   . Liver disease Neg Hx     Social History Social History  Substance Use Topics  . Smoking status: Current Every Day Smoker    Packs/day: 1.00    Years: 21.00    Types: Cigarettes  . Smokeless tobacco: Never Used     Comment: one pack daily  . Alcohol use No     Allergies   Bactrim [sulfamethoxazole-trimethoprim] and Prednisone   Review of Systems Review of Systems  Constitutional: Negative for appetite change, chills, diaphoresis, fatigue and fever.  HENT: Negative for mouth sores, sore throat and trouble swallowing.        Soft tissue swelling of the right eye lid  Eyes: Negative for visual disturbance.  Respiratory: Negative for cough, chest tightness, shortness of breath and wheezing.   Cardiovascular: Negative for chest pain.  Gastrointestinal: Negative for abdominal distention, abdominal pain, diarrhea, nausea and vomiting.  Endocrine: Negative for polydipsia, polyphagia and polyuria.  Genitourinary: Negative for dysuria, frequency and hematuria.  Musculoskeletal: Negative for gait problem.  Skin: Positive for wound. Negative for color change, pallor and rash.  Neurological: Negative for dizziness, syncope, light-headedness and headaches.  Hematological: Does not bruise/bleed easily.  Psychiatric/Behavioral: Negative for behavioral problems and confusion.     Physical Exam Updated Vital Signs BP (!) 160/64 (BP Location: Left  Arm)   Pulse (!) 57   Temp 97.9 F (36.6 C) (Oral)   Resp 17   Ht 5' 3"  (1.6 m)   Wt 66.7 kg (147 lb)   LMP 05/05/2015   SpO2 98%   BMI 26.04 kg/m   Physical Exam  Constitutional: She is  oriented to person, place, and time. She appears well-developed and well-nourished. No distress.  HENT:  Head: Normocephalic.  STS to right upper eyelid. No abscess. Full EOM without proptosis or exopthalmos. No pain with EOM.  2  cm diameter area of fluctuance and right upper hairline consistent with soft tissue abscess  Eyes: Pupils are equal, round, and reactive to light. Conjunctivae are normal. No scleral icterus.  Neck: Normal range of motion. Neck supple. No thyromegaly present.  Cardiovascular: Normal rate and regular rhythm.  Exam reveals no gallop and no friction rub.   No murmur heard. Pulmonary/Chest: Effort normal and breath sounds normal. No respiratory distress. She has no wheezes. She has no rales.  Abdominal: Soft. Bowel sounds are normal. She exhibits no distension. There is no tenderness. There is no rebound.  Musculoskeletal: Normal range of motion.  Neurological: She is alert and oriented to person, place, and time.  Skin: Skin is warm and dry. No rash noted.  Psychiatric: She has a normal mood and affect. Her behavior is normal.     ED Treatments / Results  Labs (all labs ordered are listed, but only abnormal results are displayed) Labs Reviewed  CBC - Abnormal; Notable for the following:       Result Value   RBC 3.27 (*)    Hemoglobin 10.7 (*)    HCT 32.5 (*)    All other components within normal limits    EKG  EKG Interpretation None       Radiology No results found.  Procedures Procedures (including critical care time)  Medications Ordered in ED Medications  lidocaine-EPINEPHrine (XYLOCAINE-EPINEPHrine) 1 %-1:200000 (PF) injection 10 mL (10 mLs Infiltration Given by Other 05/23/17 1252)  HYDROcodone-acetaminophen (NORCO/VICODIN) 5-325 MG per tablet 1  tablet (1 tablet Oral Given 05/23/17 1219)  clindamycin (CLEOCIN) capsule 300 mg (300 mg Oral Given 05/23/17 1300)     Initial Impression / Assessment and Plan / ED Course  I have reviewed the triage vital signs and the nursing notes.  Pertinent labs & imaging results that were available during my care of the patient were reviewed by me and considered in my medical decision making (see chart for details).     INCISION AND DRAINAGE Performed by: Lolita Patella Consent: Verbal consent obtained. Risks and benefits: risks, benefits and alternatives were discussed Type: abscess  Body area: right upper forehead at hairline  Anesthesia: local infiltration  Incision was made with a scalpel.  Local anesthetic: lidocaine 1% c epinephrine  Anesthetic total: 2 ml  Complexity: complex Blunt dissection to break up loculations  Drainage: purulent  Drainage amount: scant  Packing material: None  Patient tolerance: Patient tolerated the procedure well with no immediate complications.     Final Clinical Impressions(s) / ED Diagnoses   Final diagnoses:  Abscess  Cellulitis of face   Given p.o. clindamycin.  Prescription for clindamycin.  There is no renal dosage adjustment for this.  Given 300 3 times daily.  Continue to apply warm compresses to encourage additional drainage.  Primary care if not improving.  Also encouraged to take daily twice daily probiotic to prevent diarrhea.  New Prescriptions New Prescriptions   CLINDAMYCIN (CLEOCIN) 300 MG CAPSULE    Take 1 capsule (300 mg total) by mouth 3 (three) times daily.   HYDROCODONE-ACETAMINOPHEN (NORCO/VICODIN) 5-325 MG TABLET    Take 2 tablets by mouth every 4 (four) hours as needed.     Tanna Furry, MD 05/23/17 [phone]

## 2017-05-23 NOTE — ED Triage Notes (Addendum)
Patient complains of abscess right forehead x 1 week. Woke this morning and noticed swelling at right eyelid. Patient states nausea this morning.   Patient states she is a dialysis patient and was supposed to have treatment today.  Also, states history of MRSA.

## 2017-05-23 NOTE — Discharge Instructions (Signed)
Apply warm compress to forehead abscess three times per day. This is important to encourage further drainage of the infection and prevent the need for additional procedures.  Clindamycin three times per day.  Buy and take a Probiotic from the pharmacy and take twice per day at a different time from your antibiotic.This is very important to prevent diarrhea.

## 2017-05-24 ENCOUNTER — Telehealth: Payer: Self-pay | Admitting: Gastroenterology

## 2017-05-24 ENCOUNTER — Ambulatory Visit: Payer: Self-pay | Admitting: Family Medicine

## 2017-05-24 DIAGNOSIS — N250 Renal osteodystrophy: Secondary | ICD-10-CM | POA: Diagnosis not present

## 2017-05-24 DIAGNOSIS — N186 End stage renal disease: Secondary | ICD-10-CM | POA: Diagnosis not present

## 2017-05-24 DIAGNOSIS — Z992 Dependence on renal dialysis: Secondary | ICD-10-CM | POA: Diagnosis not present

## 2017-05-24 DIAGNOSIS — D631 Anemia in chronic kidney disease: Secondary | ICD-10-CM | POA: Diagnosis not present

## 2017-05-24 DIAGNOSIS — D509 Iron deficiency anemia, unspecified: Secondary | ICD-10-CM | POA: Diagnosis not present

## 2017-05-24 DIAGNOSIS — Z23 Encounter for immunization: Secondary | ICD-10-CM | POA: Diagnosis not present

## 2017-05-24 NOTE — Telephone Encounter (Signed)
The patient's daughter in law Lannette Donath) called to see if both the appointments in [place] were at the same place. I spoke with MB and she said that both appointments were at [facility]. I told Lannette Donath this and then she wanted to address of where they needed to go so she could tell [facility]. I told her that I would have the referral coordinator call her back, but she was with another patient at the moment. Please advise and call [phone]

## 2017-05-27 NOTE — Telephone Encounter (Signed)
Called and spoke with [place]. She reports she called [facility] already and has all the information she needs. Nothing further needed

## 2017-05-27 NOTE — Telephone Encounter (Signed)
[email or web address]

## 2017-05-28 DIAGNOSIS — D509 Iron deficiency anemia, unspecified: Secondary | ICD-10-CM | POA: Diagnosis not present

## 2017-05-28 DIAGNOSIS — Z992 Dependence on renal dialysis: Secondary | ICD-10-CM | POA: Diagnosis not present

## 2017-05-28 DIAGNOSIS — D631 Anemia in chronic kidney disease: Secondary | ICD-10-CM | POA: Diagnosis not present

## 2017-05-28 DIAGNOSIS — N186 End stage renal disease: Secondary | ICD-10-CM | POA: Diagnosis not present

## 2017-05-28 DIAGNOSIS — N250 Renal osteodystrophy: Secondary | ICD-10-CM | POA: Diagnosis not present

## 2017-05-28 DIAGNOSIS — Z23 Encounter for immunization: Secondary | ICD-10-CM | POA: Diagnosis not present

## 2017-05-29 DIAGNOSIS — E1122 Type 2 diabetes mellitus with diabetic chronic kidney disease: Secondary | ICD-10-CM | POA: Diagnosis not present

## 2017-05-29 DIAGNOSIS — N186 End stage renal disease: Secondary | ICD-10-CM | POA: Diagnosis not present

## 2017-05-29 DIAGNOSIS — F17200 Nicotine dependence, unspecified, uncomplicated: Secondary | ICD-10-CM | POA: Diagnosis not present

## 2017-05-29 DIAGNOSIS — Z992 Dependence on renal dialysis: Secondary | ICD-10-CM | POA: Diagnosis not present

## 2017-05-29 DIAGNOSIS — L03811 Cellulitis of head [any part, except face]: Secondary | ICD-10-CM | POA: Diagnosis not present

## 2017-05-29 DIAGNOSIS — Z8614 Personal history of Methicillin resistant Staphylococcus aureus infection: Secondary | ICD-10-CM | POA: Diagnosis not present

## 2017-05-29 DIAGNOSIS — Z79899 Other long term (current) drug therapy: Secondary | ICD-10-CM | POA: Diagnosis not present

## 2017-05-29 DIAGNOSIS — Z794 Long term (current) use of insulin: Secondary | ICD-10-CM | POA: Diagnosis not present

## 2017-05-29 DIAGNOSIS — I120 Hypertensive chronic kidney disease with stage 5 chronic kidney disease or end stage renal disease: Secondary | ICD-10-CM | POA: Diagnosis not present

## 2017-05-30 DIAGNOSIS — Z992 Dependence on renal dialysis: Secondary | ICD-10-CM | POA: Diagnosis not present

## 2017-05-30 DIAGNOSIS — D509 Iron deficiency anemia, unspecified: Secondary | ICD-10-CM | POA: Diagnosis not present

## 2017-05-30 DIAGNOSIS — N186 End stage renal disease: Secondary | ICD-10-CM | POA: Diagnosis not present

## 2017-05-30 DIAGNOSIS — N250 Renal osteodystrophy: Secondary | ICD-10-CM | POA: Diagnosis not present

## 2017-05-30 DIAGNOSIS — D631 Anemia in chronic kidney disease: Secondary | ICD-10-CM | POA: Diagnosis not present

## 2017-05-31 DIAGNOSIS — Z91199 Patient's noncompliance with other medical treatment and regimen due to unspecified reason: Secondary | ICD-10-CM | POA: Insufficient documentation

## 2017-05-31 DIAGNOSIS — I120 Hypertensive chronic kidney disease with stage 5 chronic kidney disease or end stage renal disease: Secondary | ICD-10-CM | POA: Diagnosis not present

## 2017-05-31 DIAGNOSIS — E1140 Type 2 diabetes mellitus with diabetic neuropathy, unspecified: Secondary | ICD-10-CM | POA: Diagnosis not present

## 2017-05-31 DIAGNOSIS — Z794 Long term (current) use of insulin: Secondary | ICD-10-CM | POA: Diagnosis not present

## 2017-05-31 DIAGNOSIS — L02811 Cutaneous abscess of head [any part, except face]: Secondary | ICD-10-CM | POA: Diagnosis not present

## 2017-05-31 DIAGNOSIS — F17200 Nicotine dependence, unspecified, uncomplicated: Secondary | ICD-10-CM | POA: Diagnosis not present

## 2017-05-31 DIAGNOSIS — Z79899 Other long term (current) drug therapy: Secondary | ICD-10-CM | POA: Diagnosis not present

## 2017-05-31 DIAGNOSIS — Z8614 Personal history of Methicillin resistant Staphylococcus aureus infection: Secondary | ICD-10-CM | POA: Diagnosis not present

## 2017-05-31 DIAGNOSIS — Z992 Dependence on renal dialysis: Secondary | ICD-10-CM | POA: Diagnosis not present

## 2017-05-31 DIAGNOSIS — G4489 Other headache syndrome: Secondary | ICD-10-CM | POA: Diagnosis not present

## 2017-05-31 DIAGNOSIS — N186 End stage renal disease: Secondary | ICD-10-CM | POA: Diagnosis not present

## 2017-05-31 DIAGNOSIS — L03811 Cellulitis of head [any part, except face]: Secondary | ICD-10-CM | POA: Diagnosis not present

## 2017-05-31 DIAGNOSIS — E1122 Type 2 diabetes mellitus with diabetic chronic kidney disease: Secondary | ICD-10-CM | POA: Diagnosis not present

## 2017-05-31 DIAGNOSIS — Z7982 Long term (current) use of aspirin: Secondary | ICD-10-CM | POA: Diagnosis not present

## 2017-06-01 DIAGNOSIS — Z992 Dependence on renal dialysis: Secondary | ICD-10-CM | POA: Diagnosis not present

## 2017-06-01 DIAGNOSIS — D631 Anemia in chronic kidney disease: Secondary | ICD-10-CM | POA: Diagnosis not present

## 2017-06-01 DIAGNOSIS — N186 End stage renal disease: Secondary | ICD-10-CM | POA: Diagnosis not present

## 2017-06-01 DIAGNOSIS — N250 Renal osteodystrophy: Secondary | ICD-10-CM | POA: Diagnosis not present

## 2017-06-01 DIAGNOSIS — D509 Iron deficiency anemia, unspecified: Secondary | ICD-10-CM | POA: Diagnosis not present

## 2017-06-04 DIAGNOSIS — N250 Renal osteodystrophy: Secondary | ICD-10-CM | POA: Diagnosis not present

## 2017-06-04 DIAGNOSIS — D509 Iron deficiency anemia, unspecified: Secondary | ICD-10-CM | POA: Diagnosis not present

## 2017-06-04 DIAGNOSIS — N186 End stage renal disease: Secondary | ICD-10-CM | POA: Diagnosis not present

## 2017-06-04 DIAGNOSIS — D631 Anemia in chronic kidney disease: Secondary | ICD-10-CM | POA: Diagnosis not present

## 2017-06-04 DIAGNOSIS — Z992 Dependence on renal dialysis: Secondary | ICD-10-CM | POA: Diagnosis not present

## 2017-06-07 DIAGNOSIS — D509 Iron deficiency anemia, unspecified: Secondary | ICD-10-CM | POA: Diagnosis not present

## 2017-06-07 DIAGNOSIS — D631 Anemia in chronic kidney disease: Secondary | ICD-10-CM | POA: Diagnosis not present

## 2017-06-07 DIAGNOSIS — Z992 Dependence on renal dialysis: Secondary | ICD-10-CM | POA: Diagnosis not present

## 2017-06-07 DIAGNOSIS — T82868A Thrombosis of vascular prosthetic devices, implants and grafts, initial encounter: Secondary | ICD-10-CM | POA: Diagnosis not present

## 2017-06-07 DIAGNOSIS — N186 End stage renal disease: Secondary | ICD-10-CM | POA: Diagnosis not present

## 2017-06-07 DIAGNOSIS — I871 Compression of vein: Secondary | ICD-10-CM | POA: Diagnosis not present

## 2017-06-07 DIAGNOSIS — N250 Renal osteodystrophy: Secondary | ICD-10-CM | POA: Diagnosis not present

## 2017-06-08 DIAGNOSIS — D509 Iron deficiency anemia, unspecified: Secondary | ICD-10-CM | POA: Diagnosis not present

## 2017-06-08 DIAGNOSIS — D631 Anemia in chronic kidney disease: Secondary | ICD-10-CM | POA: Diagnosis not present

## 2017-06-08 DIAGNOSIS — Z992 Dependence on renal dialysis: Secondary | ICD-10-CM | POA: Diagnosis not present

## 2017-06-08 DIAGNOSIS — N186 End stage renal disease: Secondary | ICD-10-CM | POA: Diagnosis not present

## 2017-06-08 DIAGNOSIS — N250 Renal osteodystrophy: Secondary | ICD-10-CM | POA: Diagnosis not present

## 2017-06-10 DIAGNOSIS — H211X1 Other vascular disorders of iris and ciliary body, right eye: Secondary | ICD-10-CM | POA: Diagnosis not present

## 2017-06-10 DIAGNOSIS — H4311 Vitreous hemorrhage, right eye: Secondary | ICD-10-CM | POA: Diagnosis not present

## 2017-06-10 DIAGNOSIS — H35371 Puckering of macula, right eye: Secondary | ICD-10-CM | POA: Diagnosis not present

## 2017-06-10 DIAGNOSIS — H4051X3 Glaucoma secondary to other eye disorders, right eye, severe stage: Secondary | ICD-10-CM | POA: Diagnosis not present

## 2017-06-10 DIAGNOSIS — E113511 Type 2 diabetes mellitus with proliferative diabetic retinopathy with macular edema, right eye: Secondary | ICD-10-CM | POA: Diagnosis not present

## 2017-06-11 DIAGNOSIS — N186 End stage renal disease: Secondary | ICD-10-CM | POA: Diagnosis not present

## 2017-06-11 DIAGNOSIS — D631 Anemia in chronic kidney disease: Secondary | ICD-10-CM | POA: Diagnosis not present

## 2017-06-11 DIAGNOSIS — Z992 Dependence on renal dialysis: Secondary | ICD-10-CM | POA: Diagnosis not present

## 2017-06-11 DIAGNOSIS — N250 Renal osteodystrophy: Secondary | ICD-10-CM | POA: Diagnosis not present

## 2017-06-11 DIAGNOSIS — D509 Iron deficiency anemia, unspecified: Secondary | ICD-10-CM | POA: Diagnosis not present

## 2017-06-13 DIAGNOSIS — Z992 Dependence on renal dialysis: Secondary | ICD-10-CM | POA: Diagnosis not present

## 2017-06-13 DIAGNOSIS — N250 Renal osteodystrophy: Secondary | ICD-10-CM | POA: Diagnosis not present

## 2017-06-13 DIAGNOSIS — D509 Iron deficiency anemia, unspecified: Secondary | ICD-10-CM | POA: Diagnosis not present

## 2017-06-13 DIAGNOSIS — D631 Anemia in chronic kidney disease: Secondary | ICD-10-CM | POA: Diagnosis not present

## 2017-06-13 DIAGNOSIS — N186 End stage renal disease: Secondary | ICD-10-CM | POA: Diagnosis not present

## 2017-06-14 ENCOUNTER — Ambulatory Visit (INDEPENDENT_AMBULATORY_CARE_PROVIDER_SITE_OTHER): Payer: Medicare Other | Admitting: Family Medicine

## 2017-06-14 ENCOUNTER — Encounter: Payer: Self-pay | Admitting: Family Medicine

## 2017-06-14 VITALS — BP 147/69 | HR 62 | Temp 97.1°F | Ht 63.0 in | Wt 146.8 lb

## 2017-06-14 DIAGNOSIS — E11319 Type 2 diabetes mellitus with unspecified diabetic retinopathy without macular edema: Secondary | ICD-10-CM

## 2017-06-14 DIAGNOSIS — I10 Essential (primary) hypertension: Secondary | ICD-10-CM

## 2017-06-14 DIAGNOSIS — Z794 Long term (current) use of insulin: Secondary | ICD-10-CM | POA: Diagnosis not present

## 2017-06-14 DIAGNOSIS — IMO0001 Reserved for inherently not codable concepts without codable children: Secondary | ICD-10-CM

## 2017-06-14 DIAGNOSIS — B999 Unspecified infectious disease: Secondary | ICD-10-CM | POA: Diagnosis not present

## 2017-06-14 NOTE — Progress Notes (Signed)
   HPI  Patient presents today here for follow-up diabetes, as well as emergency room follow-up.  Patient had a right-sided scalp abscess that was drained in the emergency room on October 25.  This had progressed into cellulitis of the face as well.  Patient explains that this is the fourth abscess that she had had in 4 months.  She requests an infectious disease referral. Mupirocin previously, however she did not do this.  Diabetes. Good medication compliance with 4 units of Lantus, fasting blood sugars are now on average from 140-200, she does have a sporadic reading above 200. Were practicing extreme caution with insulin dosing given HD. Admits to dietary indiscretions including frequently drinking sugar sweetened sodas.   Hypertension Good medication compliance. No headaches or chest pain.  PMH: Smoking status noted ROS: Per HPI  Objective: BP (!) 147/69   Pulse 62   Temp (!) 97.1 F (36.2 C) (Oral)   Ht 5' 3"  (1.6 m)   Wt 146 lb 12.8 oz (66.6 kg)   LMP 05/05/2015   BMI 26.00 kg/m  Gen: NAD, alert, cooperative with exam HEENT: NCAT, EOMI, PERRL CV: RRR, good S1/S2, no murmur Resp: CTABL, no wheezes, non-labored Ext: No edema, warm Neuro: Alert and oriented, No gross deficits  Assessment and plan:  #Type 2 diabetes Patient with improved glycemic control, however not at goal.  Average fasting blood sugars 150-200. Titrate Lantus carefully to 6 units, continue recording fasting blood sugar, patient has done very well previously with modification of lifestyle which we discussed more extensively today  #Recurrent infections Patient reports multiple infections in the last few months, on review of the records she has had a pilonidal abscess in August 2018, and nasal infection in September 2018, and now a right frontal scalp infection and abscess in October 2018. This is not my typical infectious disease referral, however she would like to discuss possible ways to reduce  recurrence so I have referred her. I have recommended MRSA eradication with mupirocin.  #Hypertension Good medication compliance with Coreg. Likely volume dependent, reasonable today, no changes    Orders Placed This Encounter  Procedures  . Ambulatory referral to Infectious Disease    Referral Priority:   Routine    Referral Type:   Consultation    Referral Reason:   Specialty Services Required    Requested Specialty:   Infectious Diseases    Number of Visits Requested:   1    No orders of the defined types were placed in this encounter.   [name], MD [facility] 06/14/2017, 2:41 PM

## 2017-06-14 NOTE — Patient Instructions (Signed)
Great to see you!  Infectious disease will contact you for an appointment  Increase your lantus to 6 units once daily.

## 2017-06-17 DIAGNOSIS — D509 Iron deficiency anemia, unspecified: Secondary | ICD-10-CM | POA: Diagnosis not present

## 2017-06-17 DIAGNOSIS — N250 Renal osteodystrophy: Secondary | ICD-10-CM | POA: Diagnosis not present

## 2017-06-17 DIAGNOSIS — D631 Anemia in chronic kidney disease: Secondary | ICD-10-CM | POA: Diagnosis not present

## 2017-06-17 DIAGNOSIS — N186 End stage renal disease: Secondary | ICD-10-CM | POA: Diagnosis not present

## 2017-06-17 DIAGNOSIS — Z992 Dependence on renal dialysis: Secondary | ICD-10-CM | POA: Diagnosis not present

## 2017-06-18 ENCOUNTER — Telehealth: Payer: Self-pay | Admitting: Family Medicine

## 2017-06-18 NOTE — Telephone Encounter (Signed)
[email or web address]

## 2017-06-19 DIAGNOSIS — D509 Iron deficiency anemia, unspecified: Secondary | ICD-10-CM | POA: Diagnosis not present

## 2017-06-19 DIAGNOSIS — N250 Renal osteodystrophy: Secondary | ICD-10-CM | POA: Diagnosis not present

## 2017-06-19 DIAGNOSIS — N186 End stage renal disease: Secondary | ICD-10-CM | POA: Diagnosis not present

## 2017-06-19 DIAGNOSIS — Z992 Dependence on renal dialysis: Secondary | ICD-10-CM | POA: Diagnosis not present

## 2017-06-19 DIAGNOSIS — D631 Anemia in chronic kidney disease: Secondary | ICD-10-CM | POA: Diagnosis not present

## 2017-06-22 DIAGNOSIS — N186 End stage renal disease: Secondary | ICD-10-CM | POA: Diagnosis not present

## 2017-06-22 DIAGNOSIS — N250 Renal osteodystrophy: Secondary | ICD-10-CM | POA: Diagnosis not present

## 2017-06-22 DIAGNOSIS — D631 Anemia in chronic kidney disease: Secondary | ICD-10-CM | POA: Diagnosis not present

## 2017-06-22 DIAGNOSIS — Z992 Dependence on renal dialysis: Secondary | ICD-10-CM | POA: Diagnosis not present

## 2017-06-22 DIAGNOSIS — D509 Iron deficiency anemia, unspecified: Secondary | ICD-10-CM | POA: Diagnosis not present

## 2017-06-24 ENCOUNTER — Telehealth: Payer: Self-pay | Admitting: Family Medicine

## 2017-06-24 MED ORDER — FLUTICASONE PROPIONATE 50 MCG/ACT NA SUSP: 2.0000 | Freq: Every day | NASAL | 6 refills | Status: DC

## 2017-06-24 NOTE — Telephone Encounter (Signed)
Pt states you said it was a prescription not otc meds and she is already using Flonase.

## 2017-06-24 NOTE — Telephone Encounter (Signed)
Medication for mucousy cough is mucinex OTC, For nasal mucous I recommend flonase which I will send.   [name], MD [facility] 06/24/2017, 12:22 PM

## 2017-06-24 NOTE — Telephone Encounter (Signed)
Pt aware of MD feedback and voiced understanding.

## 2017-06-24 NOTE — Telephone Encounter (Signed)
I recommend continue flonase and try mucinex dm, follow up if not improving.   [name], MD [facility] 06/24/2017, 3:03 PM

## 2017-06-25 DIAGNOSIS — Z992 Dependence on renal dialysis: Secondary | ICD-10-CM | POA: Diagnosis not present

## 2017-06-25 DIAGNOSIS — N186 End stage renal disease: Secondary | ICD-10-CM | POA: Diagnosis not present

## 2017-06-25 DIAGNOSIS — D509 Iron deficiency anemia, unspecified: Secondary | ICD-10-CM | POA: Diagnosis not present

## 2017-06-25 DIAGNOSIS — D631 Anemia in chronic kidney disease: Secondary | ICD-10-CM | POA: Diagnosis not present

## 2017-06-25 DIAGNOSIS — N250 Renal osteodystrophy: Secondary | ICD-10-CM | POA: Diagnosis not present

## 2017-06-26 DIAGNOSIS — N250 Renal osteodystrophy: Secondary | ICD-10-CM | POA: Diagnosis not present

## 2017-06-26 DIAGNOSIS — N186 End stage renal disease: Secondary | ICD-10-CM | POA: Diagnosis not present

## 2017-06-26 DIAGNOSIS — D631 Anemia in chronic kidney disease: Secondary | ICD-10-CM | POA: Diagnosis not present

## 2017-06-26 DIAGNOSIS — D509 Iron deficiency anemia, unspecified: Secondary | ICD-10-CM | POA: Diagnosis not present

## 2017-06-26 DIAGNOSIS — Z992 Dependence on renal dialysis: Secondary | ICD-10-CM | POA: Diagnosis not present

## 2017-06-28 DIAGNOSIS — N186 End stage renal disease: Secondary | ICD-10-CM | POA: Diagnosis not present

## 2017-06-28 DIAGNOSIS — Z992 Dependence on renal dialysis: Secondary | ICD-10-CM | POA: Diagnosis not present

## 2017-06-29 DIAGNOSIS — D631 Anemia in chronic kidney disease: Secondary | ICD-10-CM | POA: Diagnosis not present

## 2017-06-29 DIAGNOSIS — Z992 Dependence on renal dialysis: Secondary | ICD-10-CM | POA: Diagnosis not present

## 2017-06-29 DIAGNOSIS — D509 Iron deficiency anemia, unspecified: Secondary | ICD-10-CM | POA: Diagnosis not present

## 2017-06-29 DIAGNOSIS — N186 End stage renal disease: Secondary | ICD-10-CM | POA: Diagnosis not present

## 2017-06-29 DIAGNOSIS — N250 Renal osteodystrophy: Secondary | ICD-10-CM | POA: Diagnosis not present

## 2017-07-02 DIAGNOSIS — D509 Iron deficiency anemia, unspecified: Secondary | ICD-10-CM | POA: Diagnosis not present

## 2017-07-02 DIAGNOSIS — D631 Anemia in chronic kidney disease: Secondary | ICD-10-CM | POA: Diagnosis not present

## 2017-07-02 DIAGNOSIS — N250 Renal osteodystrophy: Secondary | ICD-10-CM | POA: Diagnosis not present

## 2017-07-02 DIAGNOSIS — N186 End stage renal disease: Secondary | ICD-10-CM | POA: Diagnosis not present

## 2017-07-02 DIAGNOSIS — Z992 Dependence on renal dialysis: Secondary | ICD-10-CM | POA: Diagnosis not present

## 2017-07-04 DIAGNOSIS — Z992 Dependence on renal dialysis: Secondary | ICD-10-CM | POA: Diagnosis not present

## 2017-07-04 DIAGNOSIS — D509 Iron deficiency anemia, unspecified: Secondary | ICD-10-CM | POA: Diagnosis not present

## 2017-07-04 DIAGNOSIS — N250 Renal osteodystrophy: Secondary | ICD-10-CM | POA: Diagnosis not present

## 2017-07-04 DIAGNOSIS — D631 Anemia in chronic kidney disease: Secondary | ICD-10-CM | POA: Diagnosis not present

## 2017-07-04 DIAGNOSIS — N186 End stage renal disease: Secondary | ICD-10-CM | POA: Diagnosis not present

## 2017-07-06 DIAGNOSIS — Z992 Dependence on renal dialysis: Secondary | ICD-10-CM | POA: Diagnosis not present

## 2017-07-06 DIAGNOSIS — N250 Renal osteodystrophy: Secondary | ICD-10-CM | POA: Diagnosis not present

## 2017-07-06 DIAGNOSIS — D509 Iron deficiency anemia, unspecified: Secondary | ICD-10-CM | POA: Diagnosis not present

## 2017-07-06 DIAGNOSIS — D631 Anemia in chronic kidney disease: Secondary | ICD-10-CM | POA: Diagnosis not present

## 2017-07-06 DIAGNOSIS — N186 End stage renal disease: Secondary | ICD-10-CM | POA: Diagnosis not present

## 2017-07-10 DIAGNOSIS — N250 Renal osteodystrophy: Secondary | ICD-10-CM | POA: Diagnosis not present

## 2017-07-10 DIAGNOSIS — Z992 Dependence on renal dialysis: Secondary | ICD-10-CM | POA: Diagnosis not present

## 2017-07-10 DIAGNOSIS — D631 Anemia in chronic kidney disease: Secondary | ICD-10-CM | POA: Diagnosis not present

## 2017-07-10 DIAGNOSIS — N186 End stage renal disease: Secondary | ICD-10-CM | POA: Diagnosis not present

## 2017-07-10 DIAGNOSIS — D509 Iron deficiency anemia, unspecified: Secondary | ICD-10-CM | POA: Diagnosis not present

## 2017-07-11 DIAGNOSIS — Z992 Dependence on renal dialysis: Secondary | ICD-10-CM | POA: Diagnosis not present

## 2017-07-11 DIAGNOSIS — D631 Anemia in chronic kidney disease: Secondary | ICD-10-CM | POA: Diagnosis not present

## 2017-07-11 DIAGNOSIS — N186 End stage renal disease: Secondary | ICD-10-CM | POA: Diagnosis not present

## 2017-07-11 DIAGNOSIS — N250 Renal osteodystrophy: Secondary | ICD-10-CM | POA: Diagnosis not present

## 2017-07-11 DIAGNOSIS — D509 Iron deficiency anemia, unspecified: Secondary | ICD-10-CM | POA: Diagnosis not present

## 2017-07-15 ENCOUNTER — Ambulatory Visit: Payer: Medicare Other | Admitting: Family Medicine

## 2017-07-15 ENCOUNTER — Telehealth: Payer: Self-pay | Admitting: Family Medicine

## 2017-07-15 NOTE — Telephone Encounter (Signed)
Pt notified she will need appt to be evaluated Offered appt Pt will call back to schedule

## 2017-07-16 DIAGNOSIS — D631 Anemia in chronic kidney disease: Secondary | ICD-10-CM | POA: Diagnosis not present

## 2017-07-16 DIAGNOSIS — Z992 Dependence on renal dialysis: Secondary | ICD-10-CM | POA: Diagnosis not present

## 2017-07-16 DIAGNOSIS — D509 Iron deficiency anemia, unspecified: Secondary | ICD-10-CM | POA: Diagnosis not present

## 2017-07-16 DIAGNOSIS — N250 Renal osteodystrophy: Secondary | ICD-10-CM | POA: Diagnosis not present

## 2017-07-16 DIAGNOSIS — E119 Type 2 diabetes mellitus without complications: Secondary | ICD-10-CM | POA: Diagnosis not present

## 2017-07-16 DIAGNOSIS — N186 End stage renal disease: Secondary | ICD-10-CM | POA: Diagnosis not present

## 2017-07-16 DIAGNOSIS — Z794 Long term (current) use of insulin: Secondary | ICD-10-CM | POA: Diagnosis not present

## 2017-07-17 DIAGNOSIS — N186 End stage renal disease: Secondary | ICD-10-CM | POA: Diagnosis not present

## 2017-07-17 DIAGNOSIS — Z992 Dependence on renal dialysis: Secondary | ICD-10-CM | POA: Diagnosis not present

## 2017-07-17 DIAGNOSIS — E8770 Fluid overload, unspecified: Secondary | ICD-10-CM | POA: Diagnosis not present

## 2017-07-18 DIAGNOSIS — D631 Anemia in chronic kidney disease: Secondary | ICD-10-CM | POA: Diagnosis not present

## 2017-07-18 DIAGNOSIS — N186 End stage renal disease: Secondary | ICD-10-CM | POA: Diagnosis not present

## 2017-07-18 DIAGNOSIS — Z992 Dependence on renal dialysis: Secondary | ICD-10-CM | POA: Diagnosis not present

## 2017-07-18 DIAGNOSIS — N250 Renal osteodystrophy: Secondary | ICD-10-CM | POA: Diagnosis not present

## 2017-07-18 DIAGNOSIS — D509 Iron deficiency anemia, unspecified: Secondary | ICD-10-CM | POA: Diagnosis not present

## 2017-07-19 DIAGNOSIS — H4051X3 Glaucoma secondary to other eye disorders, right eye, severe stage: Secondary | ICD-10-CM | POA: Diagnosis not present

## 2017-07-19 DIAGNOSIS — H211X1 Other vascular disorders of iris and ciliary body, right eye: Secondary | ICD-10-CM | POA: Diagnosis not present

## 2017-07-19 DIAGNOSIS — H4311 Vitreous hemorrhage, right eye: Secondary | ICD-10-CM | POA: Diagnosis not present

## 2017-07-19 DIAGNOSIS — E113511 Type 2 diabetes mellitus with proliferative diabetic retinopathy with macular edema, right eye: Secondary | ICD-10-CM | POA: Diagnosis not present

## 2017-07-20 DIAGNOSIS — N186 End stage renal disease: Secondary | ICD-10-CM | POA: Diagnosis not present

## 2017-07-20 DIAGNOSIS — N250 Renal osteodystrophy: Secondary | ICD-10-CM | POA: Diagnosis not present

## 2017-07-20 DIAGNOSIS — Z992 Dependence on renal dialysis: Secondary | ICD-10-CM | POA: Diagnosis not present

## 2017-07-20 DIAGNOSIS — D509 Iron deficiency anemia, unspecified: Secondary | ICD-10-CM | POA: Diagnosis not present

## 2017-07-20 DIAGNOSIS — D631 Anemia in chronic kidney disease: Secondary | ICD-10-CM | POA: Diagnosis not present

## 2017-07-24 DIAGNOSIS — D509 Iron deficiency anemia, unspecified: Secondary | ICD-10-CM | POA: Diagnosis not present

## 2017-07-24 DIAGNOSIS — Z992 Dependence on renal dialysis: Secondary | ICD-10-CM | POA: Diagnosis not present

## 2017-07-24 DIAGNOSIS — N186 End stage renal disease: Secondary | ICD-10-CM | POA: Diagnosis not present

## 2017-07-24 DIAGNOSIS — N250 Renal osteodystrophy: Secondary | ICD-10-CM | POA: Diagnosis not present

## 2017-07-24 DIAGNOSIS — D631 Anemia in chronic kidney disease: Secondary | ICD-10-CM | POA: Diagnosis not present

## 2017-07-25 DIAGNOSIS — N250 Renal osteodystrophy: Secondary | ICD-10-CM | POA: Diagnosis not present

## 2017-07-25 DIAGNOSIS — D631 Anemia in chronic kidney disease: Secondary | ICD-10-CM | POA: Diagnosis not present

## 2017-07-25 DIAGNOSIS — D509 Iron deficiency anemia, unspecified: Secondary | ICD-10-CM | POA: Diagnosis not present

## 2017-07-25 DIAGNOSIS — Z992 Dependence on renal dialysis: Secondary | ICD-10-CM | POA: Diagnosis not present

## 2017-07-25 DIAGNOSIS — N186 End stage renal disease: Secondary | ICD-10-CM | POA: Diagnosis not present

## 2017-07-27 DIAGNOSIS — D631 Anemia in chronic kidney disease: Secondary | ICD-10-CM | POA: Diagnosis not present

## 2017-07-27 DIAGNOSIS — N250 Renal osteodystrophy: Secondary | ICD-10-CM | POA: Diagnosis not present

## 2017-07-27 DIAGNOSIS — D509 Iron deficiency anemia, unspecified: Secondary | ICD-10-CM | POA: Diagnosis not present

## 2017-07-27 DIAGNOSIS — Z992 Dependence on renal dialysis: Secondary | ICD-10-CM | POA: Diagnosis not present

## 2017-07-27 DIAGNOSIS — N186 End stage renal disease: Secondary | ICD-10-CM | POA: Diagnosis not present

## 2017-07-29 DIAGNOSIS — D631 Anemia in chronic kidney disease: Secondary | ICD-10-CM | POA: Diagnosis not present

## 2017-07-29 DIAGNOSIS — N250 Renal osteodystrophy: Secondary | ICD-10-CM | POA: Diagnosis not present

## 2017-07-29 DIAGNOSIS — D509 Iron deficiency anemia, unspecified: Secondary | ICD-10-CM | POA: Diagnosis not present

## 2017-07-29 DIAGNOSIS — Z992 Dependence on renal dialysis: Secondary | ICD-10-CM | POA: Diagnosis not present

## 2017-07-29 DIAGNOSIS — N186 End stage renal disease: Secondary | ICD-10-CM | POA: Diagnosis not present

## 2017-08-01 ENCOUNTER — Encounter: Payer: Self-pay | Admitting: Gastroenterology

## 2017-08-01 DIAGNOSIS — D509 Iron deficiency anemia, unspecified: Secondary | ICD-10-CM | POA: Diagnosis not present

## 2017-08-01 DIAGNOSIS — N250 Renal osteodystrophy: Secondary | ICD-10-CM | POA: Diagnosis not present

## 2017-08-01 DIAGNOSIS — D631 Anemia in chronic kidney disease: Secondary | ICD-10-CM | POA: Diagnosis not present

## 2017-08-01 DIAGNOSIS — N186 End stage renal disease: Secondary | ICD-10-CM | POA: Diagnosis not present

## 2017-08-01 DIAGNOSIS — Z992 Dependence on renal dialysis: Secondary | ICD-10-CM | POA: Diagnosis not present

## 2017-08-02 ENCOUNTER — Encounter: Payer: Self-pay | Admitting: Family Medicine

## 2017-08-02 ENCOUNTER — Ambulatory Visit (INDEPENDENT_AMBULATORY_CARE_PROVIDER_SITE_OTHER): Payer: Medicare Other | Admitting: Family Medicine

## 2017-08-02 VITALS — BP 161/68 | HR 62 | Temp 97.0°F | Ht 63.0 in | Wt 147.0 lb

## 2017-08-02 DIAGNOSIS — M62830 Muscle spasm of back: Secondary | ICD-10-CM

## 2017-08-02 DIAGNOSIS — Z794 Long term (current) use of insulin: Secondary | ICD-10-CM

## 2017-08-02 DIAGNOSIS — R109 Unspecified abdominal pain: Secondary | ICD-10-CM | POA: Diagnosis not present

## 2017-08-02 DIAGNOSIS — IMO0001 Reserved for inherently not codable concepts without codable children: Secondary | ICD-10-CM

## 2017-08-02 DIAGNOSIS — E11319 Type 2 diabetes mellitus with unspecified diabetic retinopathy without macular edema: Secondary | ICD-10-CM | POA: Diagnosis not present

## 2017-08-02 LAB — URINALYSIS, COMPLETE
Bilirubin, UA: NEGATIVE
Ketones, UA: NEGATIVE
Nitrite, UA: NEGATIVE
RBC UA: NEGATIVE
SPEC GRAV UA: 1.02 (ref 1.005–1.030)
UUROB: 0.2 mg/dL (ref 0.2–1.0)
pH, UA: 7 (ref 5.0–7.5)

## 2017-08-02 LAB — MICROSCOPIC EXAMINATION

## 2017-08-02 MED ORDER — CYCLOBENZAPRINE HCL 5 MG PO TABS: 5.0000 mg | ORAL_TABLET | Freq: Three times a day (TID) | ORAL | 0 refills | Status: DC | PRN

## 2017-08-02 MED ORDER — CIPROFLOXACIN HCL 250 MG PO TABS: 250.0000 mg | ORAL_TABLET | Freq: Every day | ORAL | 0 refills | Status: DC

## 2017-08-02 NOTE — Progress Notes (Signed)
   HPI  Patient presents today here with concern for UTI and for follow-up of chronic medical conditions.  Type 2 diabetes. Patient has increased insulin 6 units once daily, her fasting glucose has been between 100 5200 on average.  She has had occasional elevations over 200. She denies any hypoglycemia.  Concern for UTI-patient reports dysuria times 2-3 weeks with intermittent abdominal pain, perineal pain, and low back pain.  Patient also complains of intermittent back spasms But these make it difficult to move when they are active, she requests something to help with them.  She also requests a wheelchair stating that it is difficult to walk over 100 feet without rest, she also has difficulty getting up frequently and requests a prescription for a lift chair.   PMH: Smoking status noted ROS: Per HPI  Objective: BP (!) 161/68   Pulse 62   Temp (!) 97 F (36.1 C) (Oral)   Ht 5' 3"  (1.6 m)   Wt 147 lb (66.7 kg)   LMP 05/05/2015   BMI 26.04 kg/m  Gen: NAD, alert, cooperative with exam HEENT: NCAT, EOMI, PERRL CV: RRR, good S1/S2, no murmur Resp: CTABL, no wheezes, non-labored Abd: SNTND, BS present, no guarding or organomegaly Ext: No edema, warm Neuro: Alert and oriented, No gross deficits  Assessment and plan:  #Abdominal pain With dysuria, patient very suspicious of UTI, this is reasonable, however urinalysis is borderline today. Culture Treat with Cipro which is renally dosed  #Back spasms Intermittent, small dose of Flexeril  #Diabetes Control improving, titrate basal insulin slightly to 7 units once daily, caution with increasing dose given her ESRD  Faculty standing-I support her request for a lift chair, will gladly sign orders when they are sent by her caseworker, also I think due to her difficulty walking and inability to leave home without assistance wheelchair is also reasonable.  Patient states that she cannot walk more than 100 feet without stopping to  rest.  Orders Placed This Encounter  Procedures  . Urine Culture  . Urinalysis, Complete    Meds ordered this encounter  Medications  . ciprofloxacin (CIPRO) 250 MG tablet    Sig: Take 1 tablet (250 mg total) by mouth daily.    Dispense:  7 tablet    Refill:  0    Renal dosing for hemodialysis  . cyclobenzaprine (FLEXERIL) 5 MG tablet    Sig: Take 1 tablet (5 mg total) by mouth 3 (three) times daily as needed for muscle spasms.    Dispense:  30 tablet    Refill:  0    [name], MD [facility] 08/02/2017, 3:46 PM

## 2017-08-02 NOTE — Patient Instructions (Signed)
Great to see you!  Try Cipro for your possible UTI, be sure to take the Cipro after dialysis on dialysis days.

## 2017-08-03 DIAGNOSIS — N186 End stage renal disease: Secondary | ICD-10-CM | POA: Diagnosis not present

## 2017-08-03 DIAGNOSIS — Z992 Dependence on renal dialysis: Secondary | ICD-10-CM | POA: Diagnosis not present

## 2017-08-03 DIAGNOSIS — D509 Iron deficiency anemia, unspecified: Secondary | ICD-10-CM | POA: Diagnosis not present

## 2017-08-03 DIAGNOSIS — N250 Renal osteodystrophy: Secondary | ICD-10-CM | POA: Diagnosis not present

## 2017-08-03 DIAGNOSIS — D631 Anemia in chronic kidney disease: Secondary | ICD-10-CM | POA: Diagnosis not present

## 2017-08-04 LAB — URINE CULTURE

## 2017-08-05 ENCOUNTER — Other Ambulatory Visit: Payer: Self-pay | Admitting: Family Medicine

## 2017-08-05 ENCOUNTER — Telehealth: Payer: Self-pay | Admitting: Family Medicine

## 2017-08-05 DIAGNOSIS — N3000 Acute cystitis without hematuria: Secondary | ICD-10-CM

## 2017-08-05 DIAGNOSIS — Z22322 Carrier or suspected carrier of Methicillin resistant Staphylococcus aureus: Secondary | ICD-10-CM

## 2017-08-05 MED ORDER — DOXYCYCLINE HYCLATE 100 MG PO TABS
100.0000 mg | ORAL_TABLET | Freq: Two times a day (BID) | ORAL | 0 refills | Status: DC
Start: 2017-08-05 — End: 2017-08-21

## 2017-08-05 NOTE — Telephone Encounter (Signed)
Called and discussed with ID on call. Since patient is non toxic appearing with no signs of bacteremia ok to manage OP with doxy and monitor.   [name], MD [facility] 08/05/2017, 8:58 AM

## 2017-08-07 ENCOUNTER — Other Ambulatory Visit (HOSPITAL_COMMUNITY): Payer: Self-pay | Admitting: Respiratory Therapy

## 2017-08-07 DIAGNOSIS — F17200 Nicotine dependence, unspecified, uncomplicated: Secondary | ICD-10-CM | POA: Diagnosis not present

## 2017-08-07 DIAGNOSIS — R0602 Shortness of breath: Secondary | ICD-10-CM

## 2017-08-07 DIAGNOSIS — E1121 Type 2 diabetes mellitus with diabetic nephropathy: Secondary | ICD-10-CM | POA: Diagnosis not present

## 2017-08-07 DIAGNOSIS — I10 Essential (primary) hypertension: Secondary | ICD-10-CM | POA: Diagnosis not present

## 2017-08-07 DIAGNOSIS — N186 End stage renal disease: Secondary | ICD-10-CM | POA: Diagnosis not present

## 2017-08-08 DIAGNOSIS — Z992 Dependence on renal dialysis: Secondary | ICD-10-CM | POA: Diagnosis not present

## 2017-08-08 DIAGNOSIS — D631 Anemia in chronic kidney disease: Secondary | ICD-10-CM | POA: Diagnosis not present

## 2017-08-08 DIAGNOSIS — N186 End stage renal disease: Secondary | ICD-10-CM | POA: Diagnosis not present

## 2017-08-08 DIAGNOSIS — N250 Renal osteodystrophy: Secondary | ICD-10-CM | POA: Diagnosis not present

## 2017-08-08 DIAGNOSIS — D509 Iron deficiency anemia, unspecified: Secondary | ICD-10-CM | POA: Diagnosis not present

## 2017-08-12 ENCOUNTER — Other Ambulatory Visit: Payer: Self-pay | Admitting: Family Medicine

## 2017-08-12 DIAGNOSIS — N186 End stage renal disease: Secondary | ICD-10-CM | POA: Diagnosis not present

## 2017-08-12 DIAGNOSIS — D631 Anemia in chronic kidney disease: Secondary | ICD-10-CM | POA: Diagnosis not present

## 2017-08-12 DIAGNOSIS — D509 Iron deficiency anemia, unspecified: Secondary | ICD-10-CM | POA: Diagnosis not present

## 2017-08-12 DIAGNOSIS — N250 Renal osteodystrophy: Secondary | ICD-10-CM | POA: Diagnosis not present

## 2017-08-12 DIAGNOSIS — Z992 Dependence on renal dialysis: Secondary | ICD-10-CM | POA: Diagnosis not present

## 2017-08-13 DIAGNOSIS — Z992 Dependence on renal dialysis: Secondary | ICD-10-CM | POA: Diagnosis not present

## 2017-08-13 DIAGNOSIS — D509 Iron deficiency anemia, unspecified: Secondary | ICD-10-CM | POA: Diagnosis not present

## 2017-08-13 DIAGNOSIS — D631 Anemia in chronic kidney disease: Secondary | ICD-10-CM | POA: Diagnosis not present

## 2017-08-13 DIAGNOSIS — N250 Renal osteodystrophy: Secondary | ICD-10-CM | POA: Diagnosis not present

## 2017-08-13 DIAGNOSIS — N186 End stage renal disease: Secondary | ICD-10-CM | POA: Diagnosis not present

## 2017-08-15 DIAGNOSIS — D631 Anemia in chronic kidney disease: Secondary | ICD-10-CM | POA: Diagnosis not present

## 2017-08-15 DIAGNOSIS — D509 Iron deficiency anemia, unspecified: Secondary | ICD-10-CM | POA: Diagnosis not present

## 2017-08-15 DIAGNOSIS — Z992 Dependence on renal dialysis: Secondary | ICD-10-CM | POA: Diagnosis not present

## 2017-08-15 DIAGNOSIS — N186 End stage renal disease: Secondary | ICD-10-CM | POA: Diagnosis not present

## 2017-08-15 DIAGNOSIS — N250 Renal osteodystrophy: Secondary | ICD-10-CM | POA: Diagnosis not present

## 2017-08-17 DIAGNOSIS — N186 End stage renal disease: Secondary | ICD-10-CM | POA: Diagnosis not present

## 2017-08-17 DIAGNOSIS — D631 Anemia in chronic kidney disease: Secondary | ICD-10-CM | POA: Diagnosis not present

## 2017-08-17 DIAGNOSIS — N250 Renal osteodystrophy: Secondary | ICD-10-CM | POA: Diagnosis not present

## 2017-08-17 DIAGNOSIS — Z992 Dependence on renal dialysis: Secondary | ICD-10-CM | POA: Diagnosis not present

## 2017-08-17 DIAGNOSIS — D509 Iron deficiency anemia, unspecified: Secondary | ICD-10-CM | POA: Diagnosis not present

## 2017-08-19 ENCOUNTER — Ambulatory Visit (HOSPITAL_COMMUNITY)
Admission: RE | Admit: 2017-08-19 | Discharge: 2017-08-19 | Disposition: A | Discharge status: Home / Self Care | Payer: Medicare Other | Source: Ambulatory Visit | Attending: Pulmonary Disease | Admitting: Pulmonary Disease

## 2017-08-19 DIAGNOSIS — R0602 Shortness of breath: Secondary | ICD-10-CM | POA: Diagnosis not present

## 2017-08-19 LAB — PULMONARY FUNCTION TEST
DL/VA % PRED: 57 %
DL/VA: 2.68 ml/min/mmHg/L
DLCO UNC % PRED: 38 %
DLCO UNC: 8.78 ml/min/mmHg
FEF 25-75 POST: 1.1 L/s
FEF 25-75 PRE: 1.84 L/s
FEF2575-%CHANGE-POST: -40 %
FEF2575-%PRED-POST: 39 %
FEF2575-%Pred-Pre: 65 %
FEV1-%Change-Post: -13 %
FEV1-%Pred-Post: 52 %
FEV1-%Pred-Pre: 60 %
FEV1-Post: 1.45 L
FEV1-Pre: 1.68 L
FEV1FVC-%Change-Post: -14 %
FEV1FVC-%PRED-PRE: 103 %
FEV6-%Change-Post: -4 %
FEV6-%Pred-Post: 56 %
FEV6-%Pred-Pre: 59 %
FEV6-POST: 1.9 L
FEV6-Pre: 2 L
FEV6FVC-%PRED-POST: 102 %
FEV6FVC-%Pred-Pre: 102 %
FVC-%CHANGE-POST: 0 %
FVC-%PRED-POST: 58 %
FVC-%Pred-Pre: 57 %
FVC-PRE: 2 L
FVC-Post: 2.02 L
POST FEV1/FVC RATIO: 72 %
PRE FEV1/FVC RATIO: 84 %
Post FEV6/FVC ratio: 100 %
Pre FEV6/FVC Ratio: 100 %
RV % pred: 105 %
RV: 1.78 L
TLC % pred: 80 %
TLC: 3.94 L

## 2017-08-19 MED ORDER — ALBUTEROL SULFATE (2.5 MG/3ML) 0.083% IN NEBU
2.5000 mg | INHALATION_SOLUTION | Freq: Once | RESPIRATORY_TRACT | Status: AC
  Administered 2017-08-19: 2.5 mg via RESPIRATORY_TRACT

## 2017-08-20 DIAGNOSIS — N250 Renal osteodystrophy: Secondary | ICD-10-CM | POA: Diagnosis not present

## 2017-08-20 DIAGNOSIS — Z992 Dependence on renal dialysis: Secondary | ICD-10-CM | POA: Diagnosis not present

## 2017-08-20 DIAGNOSIS — N186 End stage renal disease: Secondary | ICD-10-CM | POA: Diagnosis not present

## 2017-08-20 DIAGNOSIS — D631 Anemia in chronic kidney disease: Secondary | ICD-10-CM | POA: Diagnosis not present

## 2017-08-20 DIAGNOSIS — D509 Iron deficiency anemia, unspecified: Secondary | ICD-10-CM | POA: Diagnosis not present

## 2017-08-20 NOTE — Progress Notes (Signed)
Subjective:    Patient ID: Kara Knapp, female    DOB: 1971-01-15, 47 y.o.   MRN: [id number]  Chief Complaint  Patient presents with  . Recurrent Skin Infections     HPI:  Kara Knapp is a 47 y.o. female presenting today at the request of her PCP for evaluation of frequent infections.  [name] expresses concern for recurrent infections starting in August of 2018 when she was evaluated in the ED for a pilonidal abscess that was incised and drained. Her second infection occurred on 04/25/17 when she was seen for a facial infection located on her distal left nostril. She was treated with clindamycin and instructed to use warm compresses. She was seen once again in the ED on 05/23/17 for an abscess located on her forehead and right upper eyelid swelling. The abscess was incised and drained and she was given a prescription for clindamycin. She has been followed by her PCP with the recommendation for Mupirocin and MRSA eradication, which she did not follow through with.   Since October she has not had any additional skin infections. Denies fevers, with occasional chills. She uses a dial antibacterial soap on a daily basis. Blood sugars have remained elevated recently and is currently on 7 units of the insulin nightly. Recently had a urinary tract infection with MRSA.    Allergies  Allergen Reactions  . Bactrim [Sulfamethoxazole-Trimethoprim] Nausea And Vomiting  . Prednisone Other (See Comments)    "I was wide open and couldn't eat" per pt.       Outpatient Medications Prior to Visit  Medication Sig Dispense Refill  . acetaminophen (TYLENOL) 325 MG tablet Take 650 mg by mouth every 6 (six) hours as needed for mild pain.    Marland Kitchen aspirin 81 MG tablet Take 81 mg by mouth daily.    . carvedilol (COREG) 12.5 MG tablet TAKE ONE TABLET BY MOUTH TWICE DAILY 60 tablet 3  . cholecalciferol (VITAMIN D) 1000 units tablet Take 1,000 Units by mouth daily.     . citalopram (CELEXA) 20 MG  tablet TAKE ONE TABLET BY MOUTH DAILY. 30 tablet 2  . cyclobenzaprine (FLEXERIL) 5 MG tablet Take 1 tablet (5 mg total) by mouth 3 (three) times daily as needed for muscle spasms. 30 tablet 0  . dicyclomine (BENTYL) 10 MG capsule TAKE ONE CAPSULE BY MOUTH FOUR TIMES DAILY BEFORE MEALS AND AT BEDTIME AS NEEDED FOR DIARRHEA, HOLD FOR CONSTIPATION. 120 capsule 3  . diphenoxylate-atropine (LOMOTIL) 2.5-0.025 MG tablet Take 1-2 tablets by mouth 4 (four) times daily as needed for diarrhea or loose stools. 30 tablet 0  . fluticasone (FLONASE) 50 MCG/ACT nasal spray Place 2 sprays into both nostrils daily. 16 g 6  . gabapentin (NEURONTIN) 100 MG capsule TAKE ONE CAPSULE BY MOUTH THREE TIMES DAILY 90 capsule 3  . glucose blood (PRODIGY NO CODING BLOOD GLUC) test strip Test twice daily 100 each 1  . hydrALAZINE (APRESOLINE) 25 MG tablet Take 1 tablet (25 mg total) by mouth 3 (three) times daily. (Patient taking differently: Take 25 mg by mouth 2 (two) times daily. ) 90 tablet 1  . Insulin Glargine (LANTUS SOLOSTAR) 100 UNIT/ML Solostar Pen Inject 4 Units into the skin daily at 10 pm. (Patient taking differently: Inject 7 Units into the skin daily at 10 pm. ) 1 pen 5  . Insulin Pen Needle (PEN NEEDLES) 31G X 5 MM MISC 1 Device by Does not apply route daily. 100 each PRN  . Probiotic  Product (RESTORA) CAPS Take 1 capsule by mouth daily. 90 capsule 3  . PRODIGY TWIST TOP LANCETS 28G MISC USE TO CHECK BLOOD SUGAR UP TO FOUR TIMES DAILY. 100 each 1  . PROTONIX 40 MG tablet TAKE 1 BY MOUTH DAILY 90 tablet 3  . sevelamer carbonate (RENVELA) 800 MG tablet Take 2,400 mg by mouth 3 (three) times daily with meals. And with snacks.    . traZODone (DESYREL) 50 MG tablet Take 0.5-1 tablets (25-50 mg total) by mouth at bedtime as needed for sleep. 30 tablet 3  . hydrocortisone (ANUSOL-HC) 2.5 % rectal cream Place 1 application rectally 2 (two) times daily. (Patient taking differently: Place 1 application rectally as needed. )  30 g 1  . mupirocin ointment (BACTROBAN) 2 % Place 1 application into the nose 2 (two) times daily. For 5 days (Patient taking differently: Place 1 application into the nose as needed. For 5 days) 22 g 0  . ciprofloxacin (CIPRO) 250 MG tablet Take 1 tablet (250 mg total) by mouth daily. (Patient not taking: Reported on 08/21/2017) 7 tablet 0  . doxycycline (VIBRA-TABS) 100 MG tablet Take 1 tablet (100 mg total) by mouth 2 (two) times daily. 1 po bid (Patient not taking: Reported on 08/21/2017) 20 tablet 0   No facility-administered medications prior to visit.       Past Surgical History:  Procedure Laterality Date  . A/V SHUNTOGRAM N/A 10/25/2016   Procedure: A/V Shuntogram - Right Arm;  Surgeon: Waynetta Sandy, MD;  Location: [facility];  Service: Cardiovascular;  Laterality: N/A;  . AV FISTULA PLACEMENT Right 10/17/2015   Procedure: INSERTION OF ARTERIOVENOUS GORE-TEX GRAFT RIGHT UPPER ARM WITH ACUSEAL;  Surgeon: Conrad Fort Rucker, MD;  Location: [facility];  Service: Vascular;  Laterality: Right;  . CATARACT EXTRACTION W/ INTRAOCULAR LENS IMPLANT Bilateral   . CESAREAN SECTION    . CHOLECYSTECTOMY N/A 05/25/2013   Procedure: LAPAROSCOPIC CHOLECYSTECTOMY;  Surgeon: Jamesetta So, MD;  Location: [facility];  Service: General;  Laterality: N/A;  . COLONOSCOPY WITH PROPOFOL N/A 10/02/2016   Procedure: COLONOSCOPY WITH PROPOFOL;  Surgeon: Danie Binder, MD;  Location: [facility];  Service: Endoscopy;  Laterality: N/A;  145 - pt knows to arrive at 11:15 per office  . ESOPHAGOGASTRODUODENOSCOPY (EGD) WITH PROPOFOL N/A 10/02/2016   Procedure: ESOPHAGOGASTRODUODENOSCOPY (EGD) WITH PROPOFOL;  Surgeon: Danie Binder, MD;  [facility];  Service: Endoscopy;  Laterality: N/A;  . EYE SURGERY    . PARS PLANA VITRECTOMY Left 11/24/2014   Procedure: PARS PLANA VITRECTOMY WITH 25 GAUGE;  Surgeon: Hurman Horn, MD;  Location: [facility];  Service: Ophthalmology;  Laterality: Left;  . PERIPHERAL  VASCULAR CATHETERIZATION N/A 04/28/2015   Procedure: Bilateral Upper Extremity Venography;  Surgeon: Conrad Skamania, MD;  Location: [facility];  Service: Cardiovascular;  Laterality: N/A;  . PHOTOCOAGULATION WITH LASER Left 11/24/2014   Procedure: PHOTOCOAGULATION WITH LASER;  Surgeon: Hurman Horn, MD;  Location: [facility];  Service: Ophthalmology;  Laterality: Left;  with insertion of silicone oil  . SAVORY DILATION N/A 10/02/2016   Procedure: SAVORY DILATION;  Surgeon: Danie Binder, MD;  [facility];  Service: Endoscopy;  Laterality: N/A;  . TUBAL LIGATION        Past Medical History:  Diagnosis Date  . Anemia of chronic disease   . Asthma   . Blind left eye   . Bronchitis   . Cataract   .  Cholecystitis, acute 05/26/2013   Status post cholecystectomy  . Depression   . Diabetic foot ulcer ([place]) 03/01/2015  . Diastolic heart failure ([place])   . ESRD on hemodialysis ([place])    Started diaylsis 12/29/15  . Essential hypertension   . Fibroids   . Glaucoma   . History of blood transfusion   . History of pneumonia   . Hyperlipidemia   . Insulin-dependent diabetes mellitus with retinopathy ([place])   . Neuropathy   . Osteomyelitis ([place])    Toe on left foot    Social History   Socioeconomic History  . Marital status: Single    Spouse name: Not on file  . Number of children: 5  . Years of education: GED  . Highest education level: Not on file  Social Needs  . Financial resource strain: Not on file  . Food insecurity - worry: Not on file  . Food insecurity - inability: Not on file  . Transportation needs - medical: Not on file  . Transportation needs - non-medical: Not on file  Occupational History  . Not on file  Tobacco Use  . Smoking status: Current Every Day Smoker    Packs/day: 1.50    Years: 23.00    Pack years: 34.50    Types: Cigarettes  . Smokeless tobacco: Never Used  . Tobacco comment: one pack daily  Substance and Sexual Activity  . Alcohol use:  No    Alcohol/week: 0.0 oz    Frequency: Never  . Drug use: No    Comment: Sober for 8 years  . Sexual activity: Not Currently    Birth control/protection: Surgical  Other Topics Concern  . Not on file  Social History Narrative  . Not on file    Review of Systems  Constitutional: Negative for chills and fever.  Respiratory: Negative for cough, chest tightness, shortness of breath and wheezing.   Cardiovascular: Negative for chest pain, palpitations and leg swelling.  Gastrointestinal: Negative for abdominal pain.  Skin: Negative for color change, rash and wound.  Neurological: Negative for dizziness and weakness.      Objective:    BP (!) 148/77   Pulse (!) 59   Temp (!) 97 F (36.1 C) (Oral)   LMP 05/05/2015  Nursing note and vital signs reviewed.  Physical Exam  Constitutional: She appears well-developed. No distress.  Cardiovascular: Normal rate, regular rhythm, normal heart sounds and intact distal pulses. Exam reveals no gallop and no friction rub.  No murmur heard. Fistula with bruit and thrill present on right upper extremity.   Pulmonary/Chest: Effort normal and breath sounds normal. No respiratory distress. She has no wheezes. She has no rales. She exhibits no tenderness.  Skin: Skin is warm and dry. No rash noted.       Assessment & Plan:   Problem List Items Addressed This Visit      Musculoskeletal and Integument   Recurrent infection of skin - Primary    Ms. Caples describes increased frequency of skin infections and is likely colonized with MRSA. Agree that decolonization is the best option to treat this at the current time with no need for oral antibiotics. New prescription for mupirocin sent to the pharmacy with instructions for Hibiclens/chlorhexidene baths for 5 days. Encouraging that she has had no recent skin infections. Discussed importance of regulating blood sugar and drinking water to prevent urinary tract infections. I am glad to see her back  as needed.       Relevant Medications  mupirocin ointment (BACTROBAN) 2 %       I have discontinued Ellasyn A. Slaymaker's hydrocortisone, ciprofloxacin, and doxycycline. I am also having her maintain her hydrALAZINE, cholecalciferol, aspirin, sevelamer carbonate, acetaminophen, glucose blood, traZODone, PROTONIX, carvedilol, PRODIGY TWIST TOP LANCETS 28G, dicyclomine, RESTORA, citalopram, Insulin Glargine, Pen Needles, diphenoxylate-atropine, fluticasone, cyclobenzaprine, gabapentin, and mupirocin ointment.   Meds ordered this encounter  Medications  . mupirocin ointment (BACTROBAN) 2 %    Sig: Place 1 application into the nose 2 (two) times daily. For 5 days    Dispense:  22 g    Refill:  0    Order Specific Question:   Supervising Provider    Answer:   Carlyle Basques [[id number]]     Follow-up:  As needed   Terri Piedra, MSN, [facility]

## 2017-08-21 ENCOUNTER — Ambulatory Visit (INDEPENDENT_AMBULATORY_CARE_PROVIDER_SITE_OTHER): Payer: Medicare Other | Admitting: Family

## 2017-08-21 ENCOUNTER — Encounter: Payer: Self-pay | Admitting: Family

## 2017-08-21 VITALS — BP 148/77 | HR 59 | Temp 97.0°F

## 2017-08-21 DIAGNOSIS — L089 Local infection of the skin and subcutaneous tissue, unspecified: Secondary | ICD-10-CM

## 2017-08-21 MED ORDER — MUPIROCIN 2 % EX OINT: 1.0000 "application " | TOPICAL_OINTMENT | Freq: Two times a day (BID) | CUTANEOUS | 0 refills | Status: DC

## 2017-08-21 NOTE — Assessment & Plan Note (Addendum)
Kara Knapp describes increased frequency of skin infections and is likely colonized with MRSA. Agree that decolonization is the best option to treat this at the current time with no need for oral antibiotics. New prescription for mupirocin sent to the pharmacy with instructions for Hibiclens/chlorhexidene baths for 5 days. Encouraging that she has had no recent skin infections. Discussed importance of regulating blood sugar and drinking water to prevent urinary tract infections. I am glad to see her back as needed.

## 2017-08-21 NOTE — Patient Instructions (Addendum)
Nice to meet you!  Recommend using the mupiricin cream in your nostrils twice daily for 5 days and Hibiclens (available over the counter) wash daily for 5 days.   If you continue to have infections please follow up with [place].

## 2017-08-23 DIAGNOSIS — N186 End stage renal disease: Secondary | ICD-10-CM | POA: Diagnosis not present

## 2017-08-23 DIAGNOSIS — Z992 Dependence on renal dialysis: Secondary | ICD-10-CM | POA: Diagnosis not present

## 2017-08-23 DIAGNOSIS — N250 Renal osteodystrophy: Secondary | ICD-10-CM | POA: Diagnosis not present

## 2017-08-23 DIAGNOSIS — D509 Iron deficiency anemia, unspecified: Secondary | ICD-10-CM | POA: Diagnosis not present

## 2017-08-23 DIAGNOSIS — D631 Anemia in chronic kidney disease: Secondary | ICD-10-CM | POA: Diagnosis not present

## 2017-08-24 DIAGNOSIS — D509 Iron deficiency anemia, unspecified: Secondary | ICD-10-CM | POA: Diagnosis not present

## 2017-08-24 DIAGNOSIS — D631 Anemia in chronic kidney disease: Secondary | ICD-10-CM | POA: Diagnosis not present

## 2017-08-24 DIAGNOSIS — N186 End stage renal disease: Secondary | ICD-10-CM | POA: Diagnosis not present

## 2017-08-24 DIAGNOSIS — Z992 Dependence on renal dialysis: Secondary | ICD-10-CM | POA: Diagnosis not present

## 2017-08-24 DIAGNOSIS — N250 Renal osteodystrophy: Secondary | ICD-10-CM | POA: Diagnosis not present

## 2017-08-26 DIAGNOSIS — H4051X3 Glaucoma secondary to other eye disorders, right eye, severe stage: Secondary | ICD-10-CM | POA: Diagnosis not present

## 2017-08-26 DIAGNOSIS — H211X1 Other vascular disorders of iris and ciliary body, right eye: Secondary | ICD-10-CM | POA: Diagnosis not present

## 2017-08-26 DIAGNOSIS — E113592 Type 2 diabetes mellitus with proliferative diabetic retinopathy without macular edema, left eye: Secondary | ICD-10-CM | POA: Diagnosis not present

## 2017-08-26 DIAGNOSIS — E113511 Type 2 diabetes mellitus with proliferative diabetic retinopathy with macular edema, right eye: Secondary | ICD-10-CM | POA: Diagnosis not present

## 2017-08-27 DIAGNOSIS — D509 Iron deficiency anemia, unspecified: Secondary | ICD-10-CM | POA: Diagnosis not present

## 2017-08-27 DIAGNOSIS — N250 Renal osteodystrophy: Secondary | ICD-10-CM | POA: Diagnosis not present

## 2017-08-27 DIAGNOSIS — Z992 Dependence on renal dialysis: Secondary | ICD-10-CM | POA: Diagnosis not present

## 2017-08-27 DIAGNOSIS — N186 End stage renal disease: Secondary | ICD-10-CM | POA: Diagnosis not present

## 2017-08-27 DIAGNOSIS — D631 Anemia in chronic kidney disease: Secondary | ICD-10-CM | POA: Diagnosis not present

## 2017-08-29 ENCOUNTER — Other Ambulatory Visit: Payer: Self-pay | Admitting: Family Medicine

## 2017-08-29 DIAGNOSIS — N186 End stage renal disease: Secondary | ICD-10-CM | POA: Diagnosis not present

## 2017-08-29 DIAGNOSIS — Z992 Dependence on renal dialysis: Secondary | ICD-10-CM | POA: Diagnosis not present

## 2017-08-29 DIAGNOSIS — D509 Iron deficiency anemia, unspecified: Secondary | ICD-10-CM | POA: Diagnosis not present

## 2017-08-29 DIAGNOSIS — D631 Anemia in chronic kidney disease: Secondary | ICD-10-CM | POA: Diagnosis not present

## 2017-08-29 DIAGNOSIS — N250 Renal osteodystrophy: Secondary | ICD-10-CM | POA: Diagnosis not present

## 2017-08-30 NOTE — Telephone Encounter (Signed)
Please forward to PCP

## 2017-08-31 DIAGNOSIS — D631 Anemia in chronic kidney disease: Secondary | ICD-10-CM | POA: Diagnosis not present

## 2017-08-31 DIAGNOSIS — N250 Renal osteodystrophy: Secondary | ICD-10-CM | POA: Diagnosis not present

## 2017-08-31 DIAGNOSIS — D509 Iron deficiency anemia, unspecified: Secondary | ICD-10-CM | POA: Diagnosis not present

## 2017-08-31 DIAGNOSIS — N186 End stage renal disease: Secondary | ICD-10-CM | POA: Diagnosis not present

## 2017-08-31 DIAGNOSIS — Z992 Dependence on renal dialysis: Secondary | ICD-10-CM | POA: Diagnosis not present

## 2017-09-03 DIAGNOSIS — D509 Iron deficiency anemia, unspecified: Secondary | ICD-10-CM | POA: Diagnosis not present

## 2017-09-03 DIAGNOSIS — Z992 Dependence on renal dialysis: Secondary | ICD-10-CM | POA: Diagnosis not present

## 2017-09-03 DIAGNOSIS — D631 Anemia in chronic kidney disease: Secondary | ICD-10-CM | POA: Diagnosis not present

## 2017-09-03 DIAGNOSIS — N250 Renal osteodystrophy: Secondary | ICD-10-CM | POA: Diagnosis not present

## 2017-09-03 DIAGNOSIS — N186 End stage renal disease: Secondary | ICD-10-CM | POA: Diagnosis not present

## 2017-09-07 ENCOUNTER — Emergency Department (HOSPITAL_COMMUNITY): Payer: Medicare Other

## 2017-09-07 ENCOUNTER — Encounter (HOSPITAL_COMMUNITY): Payer: Self-pay | Admitting: Emergency Medicine

## 2017-09-07 ENCOUNTER — Emergency Department (HOSPITAL_COMMUNITY)
Admission: EM | Admit: 2017-09-07 | Discharge: 2017-09-07 | Disposition: A | Discharge status: Home / Self Care | Payer: Medicare Other | Attending: Emergency Medicine | Admitting: Emergency Medicine

## 2017-09-07 DIAGNOSIS — E11319 Type 2 diabetes mellitus with unspecified diabetic retinopathy without macular edema: Secondary | ICD-10-CM | POA: Diagnosis not present

## 2017-09-07 DIAGNOSIS — Z992 Dependence on renal dialysis: Secondary | ICD-10-CM | POA: Diagnosis not present

## 2017-09-07 DIAGNOSIS — I132 Hypertensive heart and chronic kidney disease with heart failure and with stage 5 chronic kidney disease, or end stage renal disease: Secondary | ICD-10-CM | POA: Insufficient documentation

## 2017-09-07 DIAGNOSIS — I5032 Chronic diastolic (congestive) heart failure: Secondary | ICD-10-CM | POA: Diagnosis not present

## 2017-09-07 DIAGNOSIS — F17210 Nicotine dependence, cigarettes, uncomplicated: Secondary | ICD-10-CM | POA: Diagnosis not present

## 2017-09-07 DIAGNOSIS — N186 End stage renal disease: Secondary | ICD-10-CM | POA: Insufficient documentation

## 2017-09-07 DIAGNOSIS — Z7982 Long term (current) use of aspirin: Secondary | ICD-10-CM | POA: Insufficient documentation

## 2017-09-07 DIAGNOSIS — E1140 Type 2 diabetes mellitus with diabetic neuropathy, unspecified: Secondary | ICD-10-CM | POA: Diagnosis not present

## 2017-09-07 DIAGNOSIS — Z79899 Other long term (current) drug therapy: Secondary | ICD-10-CM | POA: Diagnosis not present

## 2017-09-07 DIAGNOSIS — Z794 Long term (current) use of insulin: Secondary | ICD-10-CM | POA: Diagnosis not present

## 2017-09-07 DIAGNOSIS — R109 Unspecified abdominal pain: Secondary | ICD-10-CM

## 2017-09-07 DIAGNOSIS — R1084 Generalized abdominal pain: Secondary | ICD-10-CM | POA: Diagnosis not present

## 2017-09-07 DIAGNOSIS — R188 Other ascites: Secondary | ICD-10-CM | POA: Diagnosis not present

## 2017-09-07 DIAGNOSIS — I120 Hypertensive chronic kidney disease with stage 5 chronic kidney disease or end stage renal disease: Secondary | ICD-10-CM | POA: Diagnosis not present

## 2017-09-07 HISTORY — DX: Unspecified abdominal pain: R10.9

## 2017-09-07 HISTORY — DX: Noninfective gastroenteritis and colitis, unspecified: K52.9

## 2017-09-07 HISTORY — DX: Other chronic pain: G89.29

## 2017-09-07 LAB — CBC WITH DIFFERENTIAL/PLATELET
BASOS ABS: 0 10*3/uL (ref 0.0–0.1)
BASOS PCT: 0 %
EOS ABS: 0.1 10*3/uL (ref 0.0–0.7)
Eosinophils Relative: 1 %
HCT: 36.1 % (ref 36.0–46.0)
Hemoglobin: 11.6 g/dL — ABNORMAL LOW (ref 12.0–15.0)
LYMPHS PCT: 13 %
Lymphs Abs: 1 10*3/uL (ref 0.7–4.0)
MCH: 31.4 pg (ref 26.0–34.0)
MCHC: 32.1 g/dL (ref 30.0–36.0)
MCV: 97.8 fL (ref 78.0–100.0)
MONO ABS: 0.3 10*3/uL (ref 0.1–1.0)
Monocytes Relative: 4 %
Neutro Abs: 5.9 10*3/uL (ref 1.7–7.7)
Neutrophils Relative %: 82 %
Platelets: 170 10*3/uL (ref 150–400)
RBC: 3.69 MIL/uL — ABNORMAL LOW (ref 3.87–5.11)
RDW: 13.1 % (ref 11.5–15.5)
WBC: 7.3 10*3/uL (ref 4.0–10.5)

## 2017-09-07 LAB — COMPREHENSIVE METABOLIC PANEL
ALBUMIN: 3.2 g/dL — AB (ref 3.5–5.0)
ALK PHOS: 343 U/L — AB (ref 38–126)
ALT: 23 U/L (ref 14–54)
AST: 31 U/L (ref 15–41)
Anion gap: 13 (ref 5–15)
BUN: 64 mg/dL — ABNORMAL HIGH (ref 6–20)
CALCIUM: 8.8 mg/dL — AB (ref 8.9–10.3)
CHLORIDE: 98 mmol/L — AB (ref 101–111)
CO2: 23 mmol/L (ref 22–32)
CREATININE: 5.5 mg/dL — AB (ref 0.44–1.00)
GFR calc non Af Amer: 8 mL/min — ABNORMAL LOW (ref >60)
GFR, EST AFRICAN AMERICAN: 10 mL/min — AB (ref >60)
GLUCOSE: 297 mg/dL — AB (ref 65–99)
Potassium: 4.4 mmol/L (ref 3.5–5.1)
SODIUM: 134 mmol/L — AB (ref 135–145)
Total Bilirubin: 0.8 mg/dL (ref 0.3–1.2)
Total Protein: 6.5 g/dL (ref 6.5–8.1)

## 2017-09-07 LAB — URINALYSIS, ROUTINE W REFLEX MICROSCOPIC
BILIRUBIN URINE: NEGATIVE
HGB URINE DIPSTICK: NEGATIVE
KETONES UR: NEGATIVE mg/dL
LEUKOCYTES UA: NEGATIVE
NITRITE: NEGATIVE
PH: 6 (ref 5.0–8.0)
Protein, ur: 100 mg/dL — AB
Specific Gravity, Urine: 1.013 (ref 1.005–1.030)

## 2017-09-07 LAB — LIPASE, BLOOD: Lipase: 26 U/L (ref 11–51)

## 2017-09-07 NOTE — ED Provider Notes (Signed)
[facility] EMERGENCY DEPARTMENT Provider Note   CSN: [id number] Arrival date & time: 09/07/17  3875     History   Chief Complaint Chief Complaint  Patient presents with  . Abdominal Pain    HPI Zania A Kucher is a 47 y.o. female.  HPI  Pt was seen at 1005.  Per pt, c/o gradual onset and persistence of constant generalized abd "pain" for the past 3 days. Has been associated with constipation.  Describes the abd pain as "aching."  Denies N/V, no diarrhea, no fevers, no back pain, no rash, no CP/SOB, no black or blood in stools. Pt has missed her last 2 HD treatments (Thursday and today) because of her symptoms.       Past Medical History:  Diagnosis Date  . Anemia of chronic disease   . Asthma   . Blind left eye   . Bronchitis   . Cataract   . Cholecystitis, acute 05/26/2013   Status post cholecystectomy  . Chronic abdominal pain   . Chronic diarrhea   . Depression   . Diabetic foot ulcer ([place]) 03/01/2015  . Diastolic heart failure ([place])   . ESRD on hemodialysis ([place])    Started diaylsis 12/29/15  . Essential hypertension   . Fibroids   . Glaucoma   . History of blood transfusion   . History of pneumonia   . Hyperlipidemia   . Insulin-dependent diabetes mellitus with retinopathy ([place])   . Neuropathy   . Osteomyelitis ([place])    Toe on left foot    Patient Active Problem List   Diagnosis Date Noted  . Recurrent infection of skin 08/21/2017  . Encounter for well woman exam with routine gynecological exam 01/14/2017  . Ascites 12/21/2016  . Fatty liver 11/08/2016  . Rectal bleeding 09/07/2016  . Legally blind 09/06/2016  . Hepatomegaly 05/04/2016  . Abnormal LFTs 05/04/2016  . Chronic diarrhea 05/04/2016  . Esophageal dysphagia 05/04/2016  . Anemia 05/04/2016  . Mood disorder ([place]) 08/12/2015  . Fibroid, uterine 01/24/2015  . Iron deficiency anemia due to chronic blood loss 01/24/2015  . Menorrhagia with irregular cycle 01/24/2015  . Chronic diastolic CHF  (congestive heart failure) ([place]) 01/21/2015  . Insulin-dependent diabetes mellitus with retinopathy ([place]) 01/21/2015  . Diabetic neuropathy ([place]) 01/21/2015  . ESRD on dialysis ([place]) 01/21/2015  . Anemia of chronic disease 01/21/2015  . Tobacco abuse 01/21/2015  . Cholecystitis, acute 05/26/2013  . Essential hypertension, benign 05/25/2013    Past Surgical History:  Procedure Laterality Date  . A/V SHUNTOGRAM N/A 10/25/2016   Procedure: A/V Shuntogram - Right Arm;  Surgeon: Waynetta Sandy, MD;  Location: [place] [facility];  Service: Cardiovascular;  Laterality: N/A;  . AV FISTULA PLACEMENT Right 10/17/2015   Procedure: INSERTION OF ARTERIOVENOUS GORE-TEX GRAFT RIGHT UPPER ARM WITH ACUSEAL;  Surgeon: Conrad Haysville, MD;  Location: [place];  Service: Vascular;  Laterality: Right;  . CATARACT EXTRACTION W/ INTRAOCULAR LENS IMPLANT Bilateral   . CESAREAN SECTION    . CHOLECYSTECTOMY N/A 05/25/2013   Procedure: LAPAROSCOPIC CHOLECYSTECTOMY;  Surgeon: Jamesetta So, MD;  Location: [facility];  Service: General;  Laterality: N/A;  . COLONOSCOPY WITH PROPOFOL N/A 10/02/2016   Procedure: COLONOSCOPY WITH PROPOFOL;  Surgeon: Danie Binder, MD;  [facility];  Service: Endoscopy;  Laterality: N/A;  145 - pt knows to arrive at 11:15 per office  . ESOPHAGOGASTRODUODENOSCOPY (EGD) WITH PROPOFOL N/A 10/02/2016   Procedure: ESOPHAGOGASTRODUODENOSCOPY (EGD) WITH PROPOFOL;  Surgeon: Carlyon Prows  Rexene Edison, MD;  [facility];  Service: Endoscopy;  Laterality: N/A;  . EYE SURGERY    . PARS PLANA VITRECTOMY Left 11/24/2014   Procedure: PARS PLANA VITRECTOMY WITH 25 GAUGE;  Surgeon: Hurman Horn, MD;  Location: [place];  Service: Ophthalmology;  Laterality: Left;  . PERIPHERAL VASCULAR CATHETERIZATION N/A 04/28/2015   Procedure: Bilateral Upper Extremity Venography;  Surgeon: Conrad Lyncourt, MD;  Location: [place] [facility];  Service: Cardiovascular;  Laterality: N/A;  . PHOTOCOAGULATION WITH LASER  Left 11/24/2014   Procedure: PHOTOCOAGULATION WITH LASER;  Surgeon: Hurman Horn, MD;  Location: [place];  Service: Ophthalmology;  Laterality: Left;  with insertion of silicone oil  . SAVORY DILATION N/A 10/02/2016   Procedure: SAVORY DILATION;  Surgeon: Danie Binder, MD;  [facility];  Service: Endoscopy;  Laterality: N/A;  . TUBAL LIGATION      OB History    Gravida Para Term Preterm AB Living   5 5           SAB TAB Ectopic Multiple Live Births                   Home Medications    Prior to Admission medications   Medication Sig Start Date End Date Taking? Authorizing Provider  acetaminophen (TYLENOL) 325 MG tablet Take 650 mg by mouth every 6 (six) hours as needed for mild pain.    [provider]  aspirin 81 MG tablet Take 81 mg by mouth daily.    [provider]  carvedilol (COREG) 12.5 MG tablet TAKE ONE TABLET BY MOUTH TWICE DAILY 03/13/17   Timmothy Euler, MD  cholecalciferol (VITAMIN D) 1000 units tablet Take 1,000 Units by mouth daily.     [provider]  citalopram (CELEXA) 20 MG tablet TAKE ONE TABLET BY MOUTH DAILY. 05/08/17   Dettinger, Fransisca Kaufmann, MD  cyclobenzaprine (FLEXERIL) 5 MG tablet Take 1 tablet (5 mg total) by mouth 3 (three) times daily as needed for muscle spasms. 08/02/17   Timmothy Euler, MD  dicyclomine (BENTYL) 10 MG capsule TAKE ONE CAPSULE BY MOUTH FOUR TIMES DAILY BEFORE MEALS AND AT BEDTIME AS NEEDED FOR DIARRHEA, HOLD FOR CONSTIPATION. 03/12/17   Mahala Menghini, PA-C  diphenoxylate-atropine (LOMOTIL) 2.5-0.025 MG tablet Take 1-2 tablets by mouth 4 (four) times daily as needed for diarrhea or loose stools. 05/16/17   Timmothy Euler, MD  fluticasone (FLONASE) 50 MCG/ACT nasal spray Place 2 sprays into both nostrils daily. 06/24/17   Timmothy Euler, MD  gabapentin (NEURONTIN) 100 MG capsule TAKE ONE CAPSULE BY MOUTH THREE TIMES DAILY 08/12/17   Timmothy Euler, MD  glucose blood (PRODIGY NO CODING BLOOD  GLUC) test strip Test twice daily 12/10/16   Timmothy Euler, MD  hydrALAZINE (APRESOLINE) 25 MG tablet Take 1 tablet (25 mg total) by mouth 3 (three) times daily. 08/30/17   Timmothy Euler, MD  Insulin Glargine (LANTUS SOLOSTAR) 100 UNIT/ML Solostar Pen Inject 4 Units into the skin daily at 10 pm. Patient taking differently: Inject 7 Units into the skin daily at 10 pm.  05/13/17   Timmothy Euler, MD  Insulin Pen Needle (PEN NEEDLES) 31G X 5 MM MISC 1 Device by Does not apply route daily. 05/13/17   Timmothy Euler, MD  mupirocin ointment (BACTROBAN) 2 % Place 1 application into the nose 2 (two) times daily. For 5 days 08/21/17   [place], FNP  Probiotic Product (RESTORA) CAPS Take 1 capsule by mouth daily. 04/16/17   Annitta Needs, NP  PRODIGY TWIST TOP LANCETS 28G MISC USE TO CHECK BLOOD SUGAR UP TO FOUR TIMES DAILY. 03/13/17   Timmothy Euler, MD  PROTONIX 40 MG tablet TAKE 1 BY MOUTH DAILY 01/18/17   Carlis Stable, NP  sevelamer carbonate (RENVELA) 800 MG tablet Take 2,400 mg by mouth 3 (three) times daily with meals. And with snacks.    [provider]  traZODone (DESYREL) 50 MG tablet Take 0.5-1 tablets (25-50 mg total) by mouth at bedtime as needed for sleep. 01/07/17   Timmothy Euler, MD    Family History Family History  Problem Relation Age of Onset  . COPD Mother   . Cancer Father   . Lymphoma Father   . Diabetes Sister   . Deep vein thrombosis Sister   . Diabetes Brother   . Hyperlipidemia Brother   . Hypertension Brother   . Mental retardation Sister   . Alcohol abuse Paternal Grandmother   . Colon cancer Neg Hx   . Liver disease Neg Hx     Social History Social History   Tobacco Use  . Smoking status: Current Every Day Smoker    Packs/day: 1.50    Years: 23.00    Pack years: 34.50    Types: Cigarettes  . Smokeless tobacco: Never Used  . Tobacco comment: one pack daily  Substance Use Topics  . Alcohol use: No    Alcohol/week: 0.0 oz     Frequency: Never  . Drug use: No    Comment: Sober for 8 years     Allergies   Bactrim [sulfamethoxazole-trimethoprim] and Prednisone   Review of Systems Review of Systems ROS: Statement: All systems negative except as marked or noted in the HPI; Constitutional: Negative for fever and chills. ; ; Eyes: Negative for eye pain, redness and discharge. ; ; ENMT: Negative for ear pain, hoarseness, nasal congestion, sinus pressure and sore throat. ; ; Cardiovascular: Negative for chest pain, palpitations, diaphoresis, dyspnea and peripheral edema. ; ; Respiratory: Negative for cough, wheezing and stridor. ; ; Gastrointestinal: +abd pain. Negative for nausea, vomiting, diarrhea, blood in stool, hematemesis, jaundice and rectal bleeding. . ; ; Genitourinary: Negative for dysuria, flank pain and hematuria. ; ; Musculoskeletal: Negative for back pain and neck pain. Negative for swelling and trauma.; ; Skin: Negative for pruritus, rash, abrasions, blisters, bruising and skin lesion.; ; Neuro: Negative for headache, lightheadedness and neck stiffness. Negative for weakness, altered level of consciousness, altered mental status, extremity weakness, paresthesias, involuntary movement, seizure and syncope.       Physical Exam Updated Vital Signs BP (!) 185/79 (BP Location: Left Arm)   Pulse 62   Temp 97.8 F (36.6 C) (Oral)   Resp 18   Ht 5' 3"  (1.6 m)   Wt 66.7 kg (147 lb)   LMP 05/05/2015   SpO2 98%   BMI 26.04 kg/m   Physical Exam 1010: Physical examination:  Nursing notes reviewed; Vital signs and O2 SAT reviewed;  Constitutional: Well developed, Well nourished, Well hydrated, In no acute distress; Head:  Normocephalic, atraumatic; Eyes: EOMI, PERRL, No scleral icterus; ENMT: Mouth and pharynx normal, Mucous membranes moist; Neck: Supple, Full range of motion, No lymphadenopathy; Cardiovascular: Regular rate and rhythm, No murmur, rub, or gallop; Respiratory: Breath sounds clear & equal  bilaterally, No rales, rhonchi, wheezes.  Speaking full sentences with ease, Normal respiratory effort/excursion; Chest: Nontender, Movement normal;  Abdomen: Soft, +diffuse tenderness to palp. No rebound or guarding. Nondistended, Normal bowel sounds; Genitourinary: No CVA tenderness; Extremities: Pulses normal, No tenderness, No edema, No calf edema or asymmetry.; Neuro: AA&Ox3, Major CN grossly intact.  Speech clear. No gross focal motor or sensory deficits in extremities.; Skin: Color normal, Warm, Dry.   ED Treatments / Results  Labs (all labs ordered are listed, but only abnormal results are displayed)   EKG  EKG Interpretation None       Radiology   Procedures Procedures (including critical care time)  Medications Ordered in ED Medications - No data to display   Initial Impression / Assessment and Plan / ED Course  I have reviewed the triage vital signs and the nursing notes.  Pertinent labs & imaging results that were available during my care of the patient were reviewed by me and considered in my medical decision making (see chart for details).  MDM Reviewed: previous chart, nursing note and vitals Reviewed previous: labs and ECG Interpretation: labs, ECG, CT scan and x-ray   Results for orders placed or performed during the hospital encounter of 09/07/17  Comprehensive metabolic panel  Result Value Ref Range   Sodium 134 (L) 135 - 145 mmol/L   Potassium 4.4 3.5 - 5.1 mmol/L   Chloride 98 (L) 101 - 111 mmol/L   CO2 23 22 - 32 mmol/L   Glucose, Bld 297 (H) 65 - 99 mg/dL   BUN 64 (H) 6 - 20 mg/dL   Creatinine, Ser 5.50 (H) 0.44 - 1.00 mg/dL   Calcium 8.8 (L) 8.9 - 10.3 mg/dL   Total Protein 6.5 6.5 - 8.1 g/dL   Albumin 3.2 (L) 3.5 - 5.0 g/dL   AST 31 15 - 41 U/L   ALT 23 14 - 54 U/L   Alkaline Phosphatase 343 (H) 38 - 126 U/L   Total Bilirubin 0.8 0.3 - 1.2 mg/dL   GFR calc non Af Amer 8 (L) >60 mL/min   GFR calc Af Amer 10 (L) >60 mL/min   Anion gap 13 5  - 15  Lipase, blood  Result Value Ref Range   Lipase 26 11 - 51 U/L  CBC with Differential  Result Value Ref Range   WBC 7.3 4.0 - 10.5 K/uL   RBC 3.69 (L) 3.87 - 5.11 MIL/uL   Hemoglobin 11.6 (L) 12.0 - 15.0 g/dL   HCT 36.1 36.0 - 46.0 %   MCV 97.8 78.0 - 100.0 fL   MCH 31.4 26.0 - 34.0 pg   MCHC 32.1 30.0 - 36.0 g/dL   RDW 13.1 11.5 - 15.5 %   Platelets 170 150 - 400 K/uL   Neutrophils Relative % 82 %   Neutro Abs 5.9 1.7 - 7.7 K/uL   Lymphocytes Relative 13 %   Lymphs Abs 1.0 0.7 - 4.0 K/uL   Monocytes Relative 4 %   Monocytes Absolute 0.3 0.1 - 1.0 K/uL   Eosinophils Relative 1 %   Eosinophils Absolute 0.1 0.0 - 0.7 K/uL   Basophils Relative 0 %   Basophils Absolute 0.0 0.0 - 0.1 K/uL  Urinalysis, Routine w reflex microscopic  Result Value Ref Range   Color, Urine YELLOW YELLOW   APPearance CLEAR CLEAR   Specific Gravity, Urine 1.013 1.005 - 1.030   pH 6.0 5.0 - 8.0   Glucose, UA >=500 (A) NEGATIVE mg/dL   Hgb urine dipstick NEGATIVE NEGATIVE   Bilirubin Urine NEGATIVE NEGATIVE   Ketones, ur NEGATIVE NEGATIVE mg/dL  Protein, ur 100 (A) NEGATIVE mg/dL   Nitrite NEGATIVE NEGATIVE   Leukocytes, UA NEGATIVE NEGATIVE   RBC / HPF 0-5 0 - 5 RBC/hpf   WBC, UA 0-5 0 - 5 WBC/hpf   Bacteria, UA RARE (A) NONE SEEN   Squamous Epithelial / LPF 0-5 (A) NONE SEEN   Ct Abdomen Pelvis Wo Contrast Result Date: 09/07/2017 CLINICAL DATA:  47 year old female with acute abdominal and pelvic pain. EXAM: CT ABDOMEN AND PELVIS WITHOUT CONTRAST TECHNIQUE: Multidetector CT imaging of the abdomen and pelvis was performed following the standard protocol without IV contrast. COMPARISON:  12/13/2016 CT FINDINGS: Please note that parenchymal abnormalities may be missed without intravenous contrast. Lower chest: No acute abnormality Hepatobiliary: Upper limits normal liver size again noted. Equivocal minimal surface nodularity noted. Patient is status post cholecystectomy. No biliary dilatation.  Pancreas: Unremarkable Spleen: Upper limits normal size. Adrenals/Urinary Tract: Atrophic kidneys noted. The adrenal glands and bladder are unremarkable. Stomach/Bowel: No bowel obstruction, definite bowel wall thickening or inflammatory changes. Vascular/Lymphatic: Aortic atherosclerosis. No enlarged abdominal or pelvic lymph nodes. Reproductive: Unchanged uterine masses likely represent fibroids. No other significant abnormalities. Other: A small amount of ascites is noted, decreased from the prior study. Subcutaneous edema again noted. No pneumoperitoneum or definite focal collection. Musculoskeletal: No acute abnormality or suspicious bony lesion. IMPRESSION: 1. Small amount of ascites, decreased from the prior study 2. Upper limits normal liver and spleen size. Equivocal hepatic surface nodularity which could represent cirrhosis. Correlate clinically. 3. Uterine masses again noted likely fibroids. 4.  Aortic Atherosclerosis (ICD10-I70.0). Electronically Signed   By: Margarette Canada M.D.   On: 09/07/2017 13:42   Dg Chest 2 View Result Date: 09/07/2017 CLINICAL DATA:  Missed hemodialysis.  Abdominal pain EXAM: CHEST  2 VIEW COMPARISON:  Chest x-ray 04/29/2017 FINDINGS: Cardiomegaly. No overt edema. No confluent opacities or effusions. No acute bony abnormality. IMPRESSION: Cardiomegaly.  No active disease. Electronically Signed   By: Rolm Baptise M.D.   On: 09/07/2017 12:04    1455:  Workup reassuring. T/C to Renal Dr. Lowanda Foster, case discussed, including:  HPI, pertinent PM/SHx, VS/PE, dx testing, ED course and treatment:  He knows pt well, no indication for emergent HD at this time, pt can go to HD on Monday. Dx and testing, as well as d/w Renal MD, d/w pt.  Questions answered.  Verb understanding, agreeable to d/c home with outpt f/u.      Final Clinical Impressions(s) / ED Diagnoses   Final diagnoses:  None    ED Discharge Orders    None       Francine Graven, DO 09/09/17 0732

## 2017-09-07 NOTE — ED Triage Notes (Signed)
Pt reports abd pain with no n/v and no bm since Thursday.  Has missed last 2 dialysis appointments (Thursday and today).

## 2017-09-07 NOTE — Discharge Instructions (Signed)
Take your usual prescriptions as previously directed.  Go to dialysis on Monday. Call your regular medical doctor and your GI doctor on Monday to schedule a follow up appointment within the next week.  Return to the Emergency Department immediately sooner if worsening.

## 2017-09-07 NOTE — ED Notes (Signed)
From Rad 

## 2017-09-08 LAB — URINE CULTURE

## 2017-09-09 DIAGNOSIS — Z992 Dependence on renal dialysis: Secondary | ICD-10-CM | POA: Diagnosis not present

## 2017-09-09 DIAGNOSIS — D509 Iron deficiency anemia, unspecified: Secondary | ICD-10-CM | POA: Diagnosis not present

## 2017-09-09 DIAGNOSIS — N186 End stage renal disease: Secondary | ICD-10-CM | POA: Diagnosis not present

## 2017-09-09 DIAGNOSIS — D631 Anemia in chronic kidney disease: Secondary | ICD-10-CM | POA: Diagnosis not present

## 2017-09-09 DIAGNOSIS — N250 Renal osteodystrophy: Secondary | ICD-10-CM | POA: Diagnosis not present

## 2017-09-10 DIAGNOSIS — D631 Anemia in chronic kidney disease: Secondary | ICD-10-CM | POA: Diagnosis not present

## 2017-09-10 DIAGNOSIS — Z992 Dependence on renal dialysis: Secondary | ICD-10-CM | POA: Diagnosis not present

## 2017-09-10 DIAGNOSIS — D509 Iron deficiency anemia, unspecified: Secondary | ICD-10-CM | POA: Diagnosis not present

## 2017-09-10 DIAGNOSIS — N186 End stage renal disease: Secondary | ICD-10-CM | POA: Diagnosis not present

## 2017-09-10 DIAGNOSIS — N250 Renal osteodystrophy: Secondary | ICD-10-CM | POA: Diagnosis not present

## 2017-09-10 NOTE — Progress Notes (Signed)
Cardiology Office Note  Date: 09/11/2017   ID: Kara Knapp, DOB [birth year], MRN [id number]  PCP: Timmothy Euler, MD  Primary Cardiologist: Rozann Lesches, MD   Chief Complaint  Patient presents with  . Cardiac follow-up    History of Present Illness: Kara Knapp is a 47 y.o. female last seen in July 2018.  She is here today for a routine follow-up visit. She states that she continues to undergo hemodialysis with Dr. Lowanda Foster.  Overall her weight has been relatively stable as long as she is consistent with sessions.  She states that she was told her potassium was high recently, lab work that I was able to review from February 9 showed potassium 4.4.  I personally reviewed her ECG today which shows sinus rhythm with rightward axis and lead motion artifact.  Medications reviewed, current regimen includes aspirin, Coreg, and hydralazine.  Echocardiogram from February of last year revealed LVEF 55-60% with grade 2 diastolic dysfunction.  Past Medical History:  Diagnosis Date  . Anemia of chronic disease   . Asthma   . Blind left eye   . Bronchitis   . Cataract   . Cholecystitis, acute 05/26/2013   Status post cholecystectomy  . Chronic abdominal pain   . Chronic diarrhea   . Depression   . Diabetic foot ulcer ([place]) 03/01/2015  . Diastolic heart failure ([place])   . ESRD on hemodialysis ([place])    Started diaylsis 12/29/15  . Essential hypertension   . Fibroids   . Glaucoma   . History of blood transfusion   . History of pneumonia   . Hyperlipidemia   . Insulin-dependent diabetes mellitus with retinopathy ([place])   . Neuropathy   . Osteomyelitis ([place])    Toe on left foot    Past Surgical History:  Procedure Laterality Date  . A/V SHUNTOGRAM N/A 10/25/2016   Procedure: A/V Shuntogram - Right Arm;  Surgeon: Waynetta Sandy, MD;  Location: [facility];  Service: Cardiovascular;  Laterality: N/A;  . AV FISTULA PLACEMENT Right 10/17/2015   Procedure:  INSERTION OF ARTERIOVENOUS GORE-TEX GRAFT RIGHT UPPER ARM WITH ACUSEAL;  Surgeon: Conrad [place], MD;  Location: [place];  Service: Vascular;  Laterality: Right;  . CATARACT EXTRACTION W/ INTRAOCULAR LENS IMPLANT Bilateral   . CESAREAN SECTION    . CHOLECYSTECTOMY N/A 05/25/2013   Procedure: LAPAROSCOPIC CHOLECYSTECTOMY;  Surgeon: Jamesetta So, MD;  [facility];  Service: General;  Laterality: N/A;  . COLONOSCOPY WITH PROPOFOL N/A 10/02/2016   Procedure: COLONOSCOPY WITH PROPOFOL;  Surgeon: Danie Binder, MD;  [facility];  Service: Endoscopy;  Laterality: N/A;  145 - pt knows to arrive at 11:15 per office  . ESOPHAGOGASTRODUODENOSCOPY (EGD) WITH PROPOFOL N/A 10/02/2016   Procedure: ESOPHAGOGASTRODUODENOSCOPY (EGD) WITH PROPOFOL;  Surgeon: Danie Binder, MD;  [facility];  Service: Endoscopy;  Laterality: N/A;  . EYE SURGERY    . PARS PLANA VITRECTOMY Left 11/24/2014   Procedure: PARS PLANA VITRECTOMY WITH 25 GAUGE;  Surgeon: Hurman Horn, MD;  Location: [place];  Service: Ophthalmology;  Laterality: Left;  . PERIPHERAL VASCULAR CATHETERIZATION N/A 04/28/2015   Procedure: Bilateral Upper Extremity Venography;  Surgeon: Conrad Kemper, MD;  Location: [facility];  Service: Cardiovascular;  Laterality: N/A;  . PHOTOCOAGULATION WITH LASER Left 11/24/2014   Procedure: PHOTOCOAGULATION WITH LASER;  Surgeon: Hurman Horn, MD;  Location: [place];  Service: Ophthalmology;  Laterality: Left;  with  insertion of silicone oil  . SAVORY DILATION N/A 10/02/2016   Procedure: SAVORY DILATION;  Surgeon: Danie Binder, MD;  [facility];  Service: Endoscopy;  Laterality: N/A;  . TUBAL LIGATION      Current Outpatient Medications  Medication Sig Dispense Refill  . acetaminophen (TYLENOL) 325 MG tablet Take 650 mg by mouth every 6 (six) hours as needed for mild pain.    Marland Kitchen aspirin 81 MG tablet Take 81 mg by mouth daily.    . carvedilol (COREG) 12.5 MG tablet TAKE ONE TABLET BY  MOUTH TWICE DAILY 60 tablet 3  . cholecalciferol (VITAMIN D) 1000 units tablet Take 1,000 Units by mouth daily.     . citalopram (CELEXA) 20 MG tablet TAKE ONE TABLET BY MOUTH DAILY. 30 tablet 2  . cyclobenzaprine (FLEXERIL) 5 MG tablet Take 1 tablet (5 mg total) by mouth 3 (three) times daily as needed for muscle spasms. 30 tablet 0  . dicyclomine (BENTYL) 10 MG capsule TAKE ONE CAPSULE BY MOUTH FOUR TIMES DAILY BEFORE MEALS AND AT BEDTIME AS NEEDED FOR DIARRHEA, HOLD FOR CONSTIPATION. 120 capsule 3  . fluticasone (FLONASE) 50 MCG/ACT nasal spray Place 2 sprays into both nostrils daily. 16 g 6  . gabapentin (NEURONTIN) 100 MG capsule TAKE ONE CAPSULE BY MOUTH THREE TIMES DAILY 90 capsule 3  . glucose blood (PRODIGY NO CODING BLOOD GLUC) test strip Test twice daily 100 each 1  . hydrALAZINE (APRESOLINE) 25 MG tablet Take 25 mg by mouth 2 (two) times daily.    . Insulin Glargine (LANTUS SOLOSTAR) 100 UNIT/ML Solostar Pen Inject 4 Units into the skin daily at 10 pm. (Patient taking differently: Inject 7 Units into the skin daily at 10 pm. ) 1 pen 5  . Insulin Pen Needle (PEN NEEDLES) 31G X 5 MM MISC 1 Device by Does not apply route daily. 100 each PRN  . ofloxacin (OCUFLOX) 0.3 % ophthalmic solution Place 1 drop into the right eye 4 (four) times daily as needed.  1  . prednisoLONE acetate (PRED FORTE) 1 % ophthalmic suspension Place 1 drop into the right eye 4 (four) times daily.  6  . Probiotic Product (RESTORA) CAPS Take 1 capsule by mouth daily. 90 capsule 3  . PRODIGY TWIST TOP LANCETS 28G MISC USE TO CHECK BLOOD SUGAR UP TO FOUR TIMES DAILY. 100 each 1  . PROTONIX 40 MG tablet TAKE 1 BY MOUTH DAILY 90 tablet 3  . sevelamer carbonate (RENVELA) 800 MG tablet Take 2,400 mg by mouth 3 (three) times daily with meals. And with snacks.    . traZODone (DESYREL) 50 MG tablet Take 0.5-1 tablets (25-50 mg total) by mouth at bedtime as needed for sleep. 30 tablet 3   No current facility-administered  medications for this visit.    Allergies:  Bactrim [sulfamethoxazole-trimethoprim] and Prednisone   Social History: The patient  reports that she has been smoking cigarettes.  She has a 34.50 pack-year smoking history. she has never used smokeless tobacco. She reports that she does not drink alcohol or use drugs.   ROS:  Please see the history of present illness. Otherwise, complete review of systems is positive for recurring skin infections, vision loss.  All other systems are reviewed and negative.   Physical Exam: VS:  BP (!) 122/92   Pulse 62   Ht 5' 3"  (1.6 m)   Wt 149 lb (67.6 kg)   LMP 05/05/2015   SpO2 98%   BMI 26.39 kg/m ,  BMI Body mass index is 26.39 kg/m.  Wt Readings from Last 3 Encounters:  09/11/17 149 lb (67.6 kg)  09/07/17 147 lb (66.7 kg)  08/02/17 147 lb (66.7 kg)    General: Chronically ill-appearing woman, appears comfortable at rest. HEENT: Conjunctiva and lids normal, oropharynx clear with poor dentition. Neck: Supple, no elevated JVP or carotid bruits, no thyromegaly. Lungs: Clear to auscultation, nonlabored breathing at rest. Cardiac: Regular rate and rhythm, no S3, soft systolic murmur. Abdomen: Mild protuberant, nontender, bowel sounds present. Extremities: No pitting edema, distal pulses 2+. Skin: Warm and dry.  Musculoskeletal: No kyphosis. Neuropsychiatric: Alert and oriented x3, affect grossly appropriate.  ECG: I personally reviewed the tracing from 12/19/2016 which showed sinus rhythm with rightward axis and decreased R wave progression.  Recent Labwork: 12/19/2016: Magnesium 1.8 09/07/2017: ALT 23; AST 31; BUN 64; Creatinine, Ser 5.50; Hemoglobin 11.6; Platelets 170; Potassium 4.4; Sodium 134     Component Value Date/Time   CHOL 178 06/10/2015 1055   TRIG 94 06/10/2015 1055   HDL 46 06/10/2015 1055   CHOLHDL 3.9 06/10/2015 1055   LDLCALC 113 (H) 06/10/2015 1055    Other Studies Reviewed Today:  Echocardiogram 09/06/2016: Study  Conclusions  - Left ventricle: The cavity size was normal. Wall thickness was at the upper limits of normal. Systolic function was normal. The estimated ejection fraction was in the range of 55% to 60%. Wall motion was normal; there were no regional wall motion abnormalities. Features are consistent with a pseudonormal left ventricular filling pattern, with concomitant abnormal relaxation and increased filling pressure (grade 2 diastolic dysfunction). - Aortic valve: Mildly calcified annulus. Trileaflet; mildly calcified leaflets. There was trivial regurgitation. - Mitral valve: There was mild regurgitation. - Left atrium: The atrium was moderately dilated. - Right ventricle: The cavity size was mildly dilated. Systolic function was low normal. - Right atrium: Central venous pressure (est): 15 mm Hg. - Atrial septum: No defect or patent foramen ovale was identified. - Tricuspid valve: There was moderate regurgitation. - Pulmonic valve: There was mild regurgitation. - Pulmonary arteries: Systolic pressure was moderately increased. PA peak pressure: 54 mm Hg (S). - Pericardium, extracardiac: There was no pericardial effusion.  Impressions:  - Upper normal LV wall thickness with LVEF 55-60% and grade 2 diastolic dysfunction. Moderate left atrial enlargement. Mild mitral regurgitation. Sclerotic aortic valve with trivial aortic regurgitation. Mildly dilated right ventricle with low normal contraction. Moderate tricuspid regurgitation with evidence of moderate pulmonary hypertension, PASP estimated 54 mmHg.  Assessment and Plan:  1.  Moderate diastolic dysfunction with preserved LVEF.  As discussed previously, main avenue for fluid control is going to be through hemodialysis.  Continue regular use of antihypertensives, she is on Coreg and hydralazine.  2.  ESRD on hemodialysis, following with Dr. Lowanda Foster.  3.  Essential hypertension, continue with  present medical therapy.  Current medicines were reviewed with the patient today.   Orders Placed This Encounter  Procedures  . EKG 12-Lead    Disposition: Follow-up in 1 year.  Signed, Satira Sark, MD, [facility] 09/11/2017 3:25 PM    [facility] [place], [place], [place] [address] Phone: [phone]; Fax: [phone]

## 2017-09-11 ENCOUNTER — Encounter: Payer: Self-pay | Admitting: Cardiology

## 2017-09-11 ENCOUNTER — Ambulatory Visit (INDEPENDENT_AMBULATORY_CARE_PROVIDER_SITE_OTHER): Payer: Medicare Other | Admitting: Cardiology

## 2017-09-11 VITALS — BP 122/92 | HR 62 | Ht 63.0 in | Wt 149.0 lb

## 2017-09-11 DIAGNOSIS — Z992 Dependence on renal dialysis: Secondary | ICD-10-CM | POA: Diagnosis not present

## 2017-09-11 DIAGNOSIS — N186 End stage renal disease: Secondary | ICD-10-CM | POA: Diagnosis not present

## 2017-09-11 DIAGNOSIS — I10 Essential (primary) hypertension: Secondary | ICD-10-CM | POA: Diagnosis not present

## 2017-09-11 DIAGNOSIS — I519 Heart disease, unspecified: Secondary | ICD-10-CM | POA: Diagnosis not present

## 2017-09-11 DIAGNOSIS — I5189 Other ill-defined heart diseases: Secondary | ICD-10-CM

## 2017-09-11 NOTE — Patient Instructions (Signed)
Medication Instructions:  Your physician recommends that you continue on your current medications as directed. Please refer to the Current Medication list given to you today.  Labwork: NONE  Testing/Procedures: NONE  Follow-Up: Your physician wants you to follow-up in: [place]. You will receive a reminder letter in the mail two months in advance. If you don't receive a letter, please call our office to schedule the follow-up appointment.  Any Other Special Instructions Will Be Listed Below (If Applicable).  If you need a refill on your cardiac medications before your next appointment, please call your pharmacy.

## 2017-09-12 DIAGNOSIS — D631 Anemia in chronic kidney disease: Secondary | ICD-10-CM | POA: Diagnosis not present

## 2017-09-12 DIAGNOSIS — D509 Iron deficiency anemia, unspecified: Secondary | ICD-10-CM | POA: Diagnosis not present

## 2017-09-12 DIAGNOSIS — N250 Renal osteodystrophy: Secondary | ICD-10-CM | POA: Diagnosis not present

## 2017-09-12 DIAGNOSIS — N186 End stage renal disease: Secondary | ICD-10-CM | POA: Diagnosis not present

## 2017-09-12 DIAGNOSIS — Z992 Dependence on renal dialysis: Secondary | ICD-10-CM | POA: Diagnosis not present

## 2017-09-14 DIAGNOSIS — Z992 Dependence on renal dialysis: Secondary | ICD-10-CM | POA: Diagnosis not present

## 2017-09-14 DIAGNOSIS — D509 Iron deficiency anemia, unspecified: Secondary | ICD-10-CM | POA: Diagnosis not present

## 2017-09-14 DIAGNOSIS — N186 End stage renal disease: Secondary | ICD-10-CM | POA: Diagnosis not present

## 2017-09-14 DIAGNOSIS — N250 Renal osteodystrophy: Secondary | ICD-10-CM | POA: Diagnosis not present

## 2017-09-14 DIAGNOSIS — D631 Anemia in chronic kidney disease: Secondary | ICD-10-CM | POA: Diagnosis not present

## 2017-09-17 DIAGNOSIS — D509 Iron deficiency anemia, unspecified: Secondary | ICD-10-CM | POA: Diagnosis not present

## 2017-09-17 DIAGNOSIS — N250 Renal osteodystrophy: Secondary | ICD-10-CM | POA: Diagnosis not present

## 2017-09-17 DIAGNOSIS — D631 Anemia in chronic kidney disease: Secondary | ICD-10-CM | POA: Diagnosis not present

## 2017-09-17 DIAGNOSIS — Z992 Dependence on renal dialysis: Secondary | ICD-10-CM | POA: Diagnosis not present

## 2017-09-17 DIAGNOSIS — N186 End stage renal disease: Secondary | ICD-10-CM | POA: Diagnosis not present

## 2017-09-18 ENCOUNTER — Encounter: Payer: Self-pay | Admitting: Nurse Practitioner

## 2017-09-18 ENCOUNTER — Ambulatory Visit (INDEPENDENT_AMBULATORY_CARE_PROVIDER_SITE_OTHER): Payer: Medicare Other | Admitting: Nurse Practitioner

## 2017-09-18 VITALS — BP 181/78 | HR 61 | Temp 97.0°F | Ht 63.0 in | Wt 147.4 lb

## 2017-09-18 DIAGNOSIS — K760 Fatty (change of) liver, not elsewhere classified: Secondary | ICD-10-CM | POA: Diagnosis not present

## 2017-09-18 DIAGNOSIS — J449 Chronic obstructive pulmonary disease, unspecified: Secondary | ICD-10-CM | POA: Diagnosis not present

## 2017-09-18 DIAGNOSIS — K5900 Constipation, unspecified: Secondary | ICD-10-CM | POA: Insufficient documentation

## 2017-09-18 DIAGNOSIS — I10 Essential (primary) hypertension: Secondary | ICD-10-CM | POA: Diagnosis not present

## 2017-09-18 DIAGNOSIS — R109 Unspecified abdominal pain: Secondary | ICD-10-CM | POA: Insufficient documentation

## 2017-09-18 DIAGNOSIS — N186 End stage renal disease: Secondary | ICD-10-CM | POA: Diagnosis not present

## 2017-09-18 DIAGNOSIS — R1030 Lower abdominal pain, unspecified: Secondary | ICD-10-CM

## 2017-09-18 DIAGNOSIS — F17200 Nicotine dependence, unspecified, uncomplicated: Secondary | ICD-10-CM | POA: Diagnosis not present

## 2017-09-18 NOTE — Progress Notes (Signed)
Referring Provider: Timmothy Euler, MD Primary Care Physician:  Timmothy Euler, MD Primary GI:  Dr. Oneida Alar  Chief Complaint  Patient presents with  . Cirrhosis    f/u.  Marland Kitchen Constipation    "sometimes have to dig it out"    HPI:   Kara Knapp is a 47 y.o. female who presents for follow-up on cirrhosis and constipation.  Patient was last seen in our office 05/22/2017 for dysphasia, chronic diarrhea, rectal bleeding.  3 of EGD and dilation in March 2018 and did well until 2 weeks prior to her last visit.  Barium pill esophagram in April 2018 showed esophageal motility disorder.  At her last visit she was having no further constipation but was having diarrhea rarely.  Takes Imodium the night prior to dialysis to help.  Stools are brown.  No other GI symptoms.  Commended continue Preparation H for rectal bleeding/hemorrhoids.  Recommended supplementation with resource fruit beverage 3-4 cans a day.  Refer to [facility] for swallowing evaluation/NPV.  Diarrhea well managed continue with dicyclomine and Imodium and take a probiotic daily.  She is last seen for liver disease 04/05/2017 which noted fatty liver.  At that time the patient had multiple questions about possible cirrhosis.  Noted fatty liver with hepatomegaly, thorough evaluation with serologies.  Previous attempted paracentesis with no fluid.  Multifactorial etiology in the setting of renal disease, query possible early cirrhosis but no stigmata of portal hypertension on EGD.  Platelet count normal.  Albumin has been drifting down, however is on chronic dialysis and poor intake.  Discussed only definitive way to diagnose cirrhosis would be a liver biopsy but deferred because of acute health issues currently ongoing.  Recent CT of the abdomen and pelvis completed 09/07/2017 found again noted upper limit of normal liver size with minimal surface nodularity noted.  Spleen normal size.  Small amount of ascites decreased from previous  study.  Today she states she's not doing well. Has persistent abdominal pain and constipation . Had a CT done recently. No acute pathology. She was previously on Bentyl for diarrhea but hasn't used that. Has recently been taking OTC laxatives with no help. Has a bowel movement about every 3+ days, has to "dig it out of me." Unable to see stools for hematochezia or melena. Denies unintentional weight loss, fever, chills. On HD three times a week, missed two treatments last week due to abdominal pain. HD schedule: T, Th, Sat. Deneis acute episodic confusion, yellowing of skin/eyes, tremors. Denies chest pain, dyspnea, dizziness, lightheadedness, syncope, near syncope. Denies any other upper or lower GI symptoms.  Past Medical History:  Diagnosis Date  . Anemia of chronic disease   . Asthma   . Blind left eye   . Bronchitis   . Cataract   . Cholecystitis, acute 05/26/2013   Status post cholecystectomy  . Chronic abdominal pain   . Chronic diarrhea   . Depression   . Diabetic foot ulcer ([place]) 03/01/2015  . Diastolic heart failure ([place])   . ESRD on hemodialysis ([place])    Started diaylsis 12/29/15  . Essential hypertension   . Fibroids   . Glaucoma   . History of blood transfusion   . History of pneumonia   . Hyperlipidemia   . Insulin-dependent diabetes mellitus with retinopathy ([place])   . Neuropathy   . Osteomyelitis ([place])    Toe on left foot    Past Surgical History:  Procedure Laterality Date  . A/V SHUNTOGRAM N/A  10/25/2016   Procedure: A/V Shuntogram - Right Arm;  Surgeon: Waynetta Sandy, MD;  Location: [place] [facility];  Service: Cardiovascular;  Laterality: N/A;  . AV FISTULA PLACEMENT Right 10/17/2015   Procedure: INSERTION OF ARTERIOVENOUS GORE-TEX GRAFT RIGHT UPPER ARM WITH ACUSEAL;  Surgeon: Conrad Bairoil, MD;  Location: [place];  Service: Vascular;  Laterality: Right;  . CATARACT EXTRACTION W/ INTRAOCULAR LENS IMPLANT Bilateral   . CESAREAN SECTION    .  CHOLECYSTECTOMY N/A 05/25/2013   Procedure: LAPAROSCOPIC CHOLECYSTECTOMY;  Surgeon: Jamesetta So, MD;  [facility];  Service: General;  Laterality: N/A;  . COLONOSCOPY WITH PROPOFOL N/A 10/02/2016   Procedure: COLONOSCOPY WITH PROPOFOL;  Surgeon: Danie Binder, MD;  [facility];  Service: Endoscopy;  Laterality: N/A;  145 - pt knows to arrive at 11:15 per office  . ESOPHAGOGASTRODUODENOSCOPY (EGD) WITH PROPOFOL N/A 10/02/2016   Procedure: ESOPHAGOGASTRODUODENOSCOPY (EGD) WITH PROPOFOL;  Surgeon: Danie Binder, MD;  [facility];  Service: Endoscopy;  Laterality: N/A;  . EYE SURGERY    . PARS PLANA VITRECTOMY Left 11/24/2014   Procedure: PARS PLANA VITRECTOMY WITH 25 GAUGE;  Surgeon: Hurman Horn, MD;  Location: [place];  Service: Ophthalmology;  Laterality: Left;  . PERIPHERAL VASCULAR CATHETERIZATION N/A 04/28/2015   Procedure: Bilateral Upper Extremity Venography;  Surgeon: Conrad [place], MD;  Location: [facility];  Service: Cardiovascular;  Laterality: N/A;  . PHOTOCOAGULATION WITH LASER Left 11/24/2014   Procedure: PHOTOCOAGULATION WITH LASER;  Surgeon: Hurman Horn, MD;  Location: [place];  Service: Ophthalmology;  Laterality: Left;  with insertion of silicone oil  . SAVORY DILATION N/A 10/02/2016   Procedure: SAVORY DILATION;  Surgeon: Danie Binder, MD;  [facility];  Service: Endoscopy;  Laterality: N/A;  . TUBAL LIGATION      Current Outpatient Medications  Medication Sig Dispense Refill  . acetaminophen (TYLENOL) 325 MG tablet Take 650 mg by mouth every 6 (six) hours as needed for mild pain.    Marland Kitchen aspirin 81 MG tablet Take 81 mg by mouth daily.    . carvedilol (COREG) 12.5 MG tablet TAKE ONE TABLET BY MOUTH TWICE DAILY 60 tablet 3  . cholecalciferol (VITAMIN D) 1000 units tablet Take 1,000 Units by mouth daily.     . citalopram (CELEXA) 20 MG tablet TAKE ONE TABLET BY MOUTH DAILY. 30 tablet 2  . cyclobenzaprine (FLEXERIL) 5 MG tablet Take 1  tablet (5 mg total) by mouth 3 (three) times daily as needed for muscle spasms. 30 tablet 0  . dicyclomine (BENTYL) 10 MG capsule TAKE ONE CAPSULE BY MOUTH FOUR TIMES DAILY BEFORE MEALS AND AT BEDTIME AS NEEDED FOR DIARRHEA, HOLD FOR CONSTIPATION. 120 capsule 3  . fluticasone (FLONASE) 50 MCG/ACT nasal spray Place 2 sprays into both nostrils daily. 16 g 6  . gabapentin (NEURONTIN) 100 MG capsule TAKE ONE CAPSULE BY MOUTH THREE TIMES DAILY 90 capsule 3  . glucose blood (PRODIGY NO CODING BLOOD GLUC) test strip Test twice daily 100 each 1  . hydrALAZINE (APRESOLINE) 25 MG tablet Take 25 mg by mouth 2 (two) times daily.    . Insulin Glargine (LANTUS SOLOSTAR) 100 UNIT/ML Solostar Pen Inject 4 Units into the skin daily at 10 pm. (Patient taking differently: Inject 7 Units into the skin daily at 10 pm. ) 1 pen 5  . Insulin Pen Needle (PEN NEEDLES) 31G X 5 MM MISC 1 Device by Does not apply  route daily. 100 each PRN  . ofloxacin (OCUFLOX) 0.3 % ophthalmic solution Place 1 drop into the right eye 4 (four) times daily as needed.  1  . prednisoLONE acetate (PRED FORTE) 1 % ophthalmic suspension Place 1 drop into the right eye 4 (four) times daily.  6  . Probiotic Product (RESTORA) CAPS Take 1 capsule by mouth daily. 90 capsule 3  . PRODIGY TWIST TOP LANCETS 28G MISC USE TO CHECK BLOOD SUGAR UP TO FOUR TIMES DAILY. 100 each 1  . PROTONIX 40 MG tablet TAKE 1 BY MOUTH DAILY 90 tablet 3  . sevelamer carbonate (RENVELA) 800 MG tablet Take 2,400 mg by mouth 3 (three) times daily with meals. And with snacks.    . traZODone (DESYREL) 50 MG tablet Take 0.5-1 tablets (25-50 mg total) by mouth at bedtime as needed for sleep. 30 tablet 3  . umeclidinium-vilanterol (ANORO ELLIPTA) 62.5-25 MCG/INH AEPB Inhale 1 puff into the lungs daily.     No current facility-administered medications for this visit.     Allergies as of 09/18/2017 - Review Complete 09/18/2017  Allergen Reaction Noted  . Bactrim  [sulfamethoxazole-trimethoprim] Nausea And Vomiting 05/21/2013  . Prednisone Other (See Comments) 07/09/2013    Family History  Problem Relation Age of Onset  . COPD Mother   . Cancer Father   . Lymphoma Father   . Diabetes Sister   . Deep vein thrombosis Sister   . Diabetes Brother   . Hyperlipidemia Brother   . Hypertension Brother   . Mental retardation Sister   . Alcohol abuse Paternal Grandmother   . Colon cancer Neg Hx   . Liver disease Neg Hx     Social History   Socioeconomic History  . Marital status: Single    Spouse name: None  . Number of children: 5  . Years of education: GED  . Highest education level: None  Social Needs  . Financial resource strain: None  . Food insecurity - worry: None  . Food insecurity - inability: None  . Transportation needs - medical: None  . Transportation needs - non-medical: None  Occupational History  . None  Tobacco Use  . Smoking status: Current Every Day Smoker    Packs/day: 1.50    Years: 23.00    Pack years: 34.50    Types: Cigarettes  . Smokeless tobacco: Never Used  . Tobacco comment: one pack daily  Substance and Sexual Activity  . Alcohol use: No    Alcohol/week: 0.0 oz    Frequency: Never  . Drug use: No    Comment: Sober for 8 years  . Sexual activity: Not Currently    Birth control/protection: Surgical  Other Topics Concern  . None  Social History Narrative  . None    Review of Systems: Complete ROS negative except as per HPI.   Physical Exam: BP (!) 181/78   Pulse 61   Temp (!) 97 F (36.1 C) (Oral)   Ht 5' 3"  (1.6 m)   Wt 147 lb 6.4 oz (66.9 kg)   LMP 05/05/2015   BMI 26.11 kg/m  General:   Alert and oriented. Pleasant and cooperative. Well-nourished and well-developed.  Head:  Normocephalic and atraumatic. Eyes:  Without icterus, sclera clear and conjunctiva pink. Seeing impaired. Ears:  Normal auditory acuity. Cardiovascular:  S1, S2 present without murmurs appreciated. Extremities  without clubbing or edema. Respiratory:  Clear to auscultation bilaterally. No wheezes, rales, or rhonchi. No distress.  Gastrointestinal:  +BS, soft, non-tender and  non-distended. No HSM noted. No guarding or rebound. No masses appreciated.  Rectal:  Deferred  Musculoskalatal:  Symmetrical without gross deformities. Neurologic:  Alert and oriented x4;  grossly normal neurologically. Psych:  Alert and cooperative. Normal mood and affect. Heme/Lymph/Immune: No excessive bruising noted.    09/18/2017 3:51 PM   Disclaimer: This note was dictated with voice recognition software. Similar sounding words can inadvertently be transcribed and may not be corrected upon review.

## 2017-09-18 NOTE — Patient Instructions (Signed)
1. Have your labs drawn when you are able to. 2. I am giving you samples of Linzess 72 mcg to take once a day. 3. Call us in 1-2 weeks and let us know if it is working. 4. Return for follow-up in 3 months. 5. Call us if you have any questions or concerns.

## 2017-09-18 NOTE — Assessment & Plan Note (Signed)
Patient with new onset lower abdominal pain which is coincided with worsening of constipation.  At this point given her description of her abdominal pain and his characteristics I feel her abdominal pain is most likely due to constipation.  However, there is other possible underlying etiology.  At this point, we will try to manage her constipation as per above to see how this affects her pain.  If her pain is persistent we can proceed with other follow-up.  Labs and imaging as per below.

## 2017-09-18 NOTE — Assessment & Plan Note (Signed)
The patient is now having recurrence of her previous chronic constipation.  More recently she was having diarrhea.  However, now she is having a bowel movement about every 3 or more days.  She is tried over-the-counter laxatives without success.  She often has to "did get out of myself" to have a bowel movement.  She is unsure about any hematochezia due to the fact that she is legally blind.  At this point I recommended she avoid Bentyl for the time being.  Abdominal pain is likely due to her constipation.  I will start her on a trial of Linzess 72 mcg daily with samples for 1-2 weeks and requested progress report in 1-2 weeks.  Return for follow-up in 3 months.

## 2017-09-18 NOTE — Assessment & Plan Note (Addendum)
History of fatty liver query early cirrhosis.  She recently had a CT of the abdomen/pelvis which showed mild nodular contour and upper limit normal hepatomegaly with upper limit normal spleen size.  Query early cirrhosis.  Historically her platelets have been normal.  At this point a liver biopsy may be a bit overkill from an invasive standpoint given her other chronic illnesses and end-stage renal disease.  At this point I will attempt noninvasive estimation of her liver fibrosis.  Unfortunately, ultrasound elastography is not covered by insurance for either fatty liver disease or cirrhosis.  However, I can proceed with Fibrosure.  At this point we will try this and based on results consider further evaluation.  At this point I will also recheck her labs including CBC, CMP, INR.  We will have her return for follow-up in 3 months.  If she does indeed have cirrhosis we will need to start checking labs every 6 months including AFP as well as imaging every 6 months.

## 2017-09-19 ENCOUNTER — Encounter: Payer: Self-pay | Admitting: Gastroenterology

## 2017-09-19 DIAGNOSIS — N250 Renal osteodystrophy: Secondary | ICD-10-CM | POA: Diagnosis not present

## 2017-09-19 DIAGNOSIS — D509 Iron deficiency anemia, unspecified: Secondary | ICD-10-CM | POA: Diagnosis not present

## 2017-09-19 DIAGNOSIS — N186 End stage renal disease: Secondary | ICD-10-CM | POA: Diagnosis not present

## 2017-09-19 DIAGNOSIS — Z992 Dependence on renal dialysis: Secondary | ICD-10-CM | POA: Diagnosis not present

## 2017-09-19 DIAGNOSIS — D631 Anemia in chronic kidney disease: Secondary | ICD-10-CM | POA: Diagnosis not present

## 2017-09-19 NOTE — Progress Notes (Signed)
cc'ed to pcp °

## 2017-09-21 DIAGNOSIS — Z992 Dependence on renal dialysis: Secondary | ICD-10-CM | POA: Diagnosis not present

## 2017-09-21 DIAGNOSIS — D509 Iron deficiency anemia, unspecified: Secondary | ICD-10-CM | POA: Diagnosis not present

## 2017-09-21 DIAGNOSIS — N250 Renal osteodystrophy: Secondary | ICD-10-CM | POA: Diagnosis not present

## 2017-09-21 DIAGNOSIS — D631 Anemia in chronic kidney disease: Secondary | ICD-10-CM | POA: Diagnosis not present

## 2017-09-21 DIAGNOSIS — N186 End stage renal disease: Secondary | ICD-10-CM | POA: Diagnosis not present

## 2017-09-23 ENCOUNTER — Telehealth: Payer: Self-pay

## 2017-09-23 ENCOUNTER — Telehealth: Payer: Self-pay | Admitting: *Deleted

## 2017-09-23 DIAGNOSIS — K5900 Constipation, unspecified: Secondary | ICD-10-CM | POA: Diagnosis not present

## 2017-09-23 DIAGNOSIS — R1030 Lower abdominal pain, unspecified: Secondary | ICD-10-CM | POA: Diagnosis not present

## 2017-09-23 DIAGNOSIS — K760 Fatty (change of) liver, not elsewhere classified: Secondary | ICD-10-CM | POA: Diagnosis not present

## 2017-09-23 LAB — CBC WITH DIFFERENTIAL/PLATELET
BASOS PCT: 1.2 %
Basophils Absolute: 71 cells/uL (ref 0–200)
EOS PCT: 2.4 %
Eosinophils Absolute: 142 cells/uL (ref 15–500)
HCT: 33.5 % — ABNORMAL LOW (ref 35.0–45.0)
HEMOGLOBIN: 11 g/dL — AB (ref 11.7–15.5)
Lymphs Abs: 726 cells/uL — ABNORMAL LOW (ref 850–3900)
MCH: 31.1 pg (ref 27.0–33.0)
MCHC: 32.8 g/dL (ref 32.0–36.0)
MCV: 94.6 fL (ref 80.0–100.0)
MONOS PCT: 8.6 %
MPV: 11.5 fL (ref 7.5–12.5)
NEUTROS ABS: 4455 {cells}/uL (ref 1500–7800)
Neutrophils Relative %: 75.5 %
PLATELETS: 195 10*3/uL (ref 140–400)
RBC: 3.54 10*6/uL — AB (ref 3.80–5.10)
RDW: 11.9 % (ref 11.0–15.0)
TOTAL LYMPHOCYTE: 12.3 %
WBC mixed population: 507 cells/uL (ref 200–950)
WBC: 5.9 10*3/uL (ref 3.8–10.8)

## 2017-09-23 LAB — COMPREHENSIVE METABOLIC PANEL
AG Ratio: 1.4 (calc) (ref 1.0–2.5)
ALBUMIN MSPROF: 3.6 g/dL (ref 3.6–5.1)
ALT: 13 U/L (ref 6–29)
AST: 17 U/L (ref 10–35)
Alkaline phosphatase (APISO): 349 U/L — ABNORMAL HIGH (ref 33–115)
BUN / CREAT RATIO: 11 (calc) (ref 6–22)
BUN: 42 mg/dL — ABNORMAL HIGH (ref 7–25)
CHLORIDE: 92 mmol/L — AB (ref 98–110)
CO2: 29 mmol/L (ref 20–32)
CREATININE: 3.97 mg/dL — AB (ref 0.50–1.10)
Calcium: 9.1 mg/dL (ref 8.6–10.2)
GLOBULIN: 2.5 g/dL (ref 1.9–3.7)
GLUCOSE: 472 mg/dL — AB (ref 65–139)
POTASSIUM: 4.9 mmol/L (ref 3.5–5.3)
SODIUM: 129 mmol/L — AB (ref 135–146)
TOTAL PROTEIN: 6.1 g/dL (ref 6.1–8.1)
Total Bilirubin: 0.5 mg/dL (ref 0.2–1.2)

## 2017-09-23 LAB — PROTIME-INR
INR: 1
Prothrombin Time: 10.6 s (ref 9.0–11.5)

## 2017-09-23 NOTE — Telephone Encounter (Signed)
I called pt on her abnormal glucose of 472 and she said she had only had a sip of sweet tea prior to the lab draw. I told her she should contact PCP immediately. She has not been checking her sugar because she cannot see and her aid is not allowed to do it. She will try to work out something with daughter-in-law to start doing again.

## 2017-09-23 NOTE — Telephone Encounter (Signed)
CMP and PT/INR results faxed over and placed on Eric's desk.  Please note abnormal results.

## 2017-09-23 NOTE — Telephone Encounter (Signed)
Pt aware of directions - she is going to eat better and be more mindful -- appt for regular check up was moved up to Friday.

## 2017-09-23 NOTE — Telephone Encounter (Signed)
Looking over blood owrk the patient does not have any acidosis.   She is tolerating PO ok.   Take 4 units of lantus now and increase daily dose to 10 units. ( taking 7 tonight, starting 10 tomorrow night). If unable to tolerate PO, or BS not improved to less than 400 in 2-3 hours needs to go to the ED.   Avoid sugar sweetened bevaerages completely.   [name], MD [facility] 09/23/2017, 4:01 PM

## 2017-09-23 NOTE — Telephone Encounter (Signed)
I received a call from [place] with STAT CBC but nothing critical. She has faxed the CBC report to [place] and Hemoglobin is 11.5. CMP and PT/INR pending. Results placed on the desk for review by Walden Field, NP.

## 2017-09-24 ENCOUNTER — Other Ambulatory Visit: Payer: Self-pay | Admitting: Nurse Practitioner

## 2017-09-24 DIAGNOSIS — R748 Abnormal levels of other serum enzymes: Secondary | ICD-10-CM

## 2017-09-24 DIAGNOSIS — N250 Renal osteodystrophy: Secondary | ICD-10-CM | POA: Diagnosis not present

## 2017-09-24 DIAGNOSIS — D631 Anemia in chronic kidney disease: Secondary | ICD-10-CM | POA: Diagnosis not present

## 2017-09-24 DIAGNOSIS — D509 Iron deficiency anemia, unspecified: Secondary | ICD-10-CM | POA: Diagnosis not present

## 2017-09-24 DIAGNOSIS — Z992 Dependence on renal dialysis: Secondary | ICD-10-CM | POA: Diagnosis not present

## 2017-09-24 DIAGNOSIS — N186 End stage renal disease: Secondary | ICD-10-CM | POA: Diagnosis not present

## 2017-09-24 NOTE — Progress Notes (Signed)
Called pt and reviewed her message since she had not seen it. She will try to go to the lab by Friday.

## 2017-09-24 NOTE — Progress Notes (Signed)
GGT to follow-up on elevated Alk Phos

## 2017-09-25 ENCOUNTER — Telehealth: Payer: Self-pay | Admitting: Gastroenterology

## 2017-09-25 ENCOUNTER — Other Ambulatory Visit: Payer: Self-pay

## 2017-09-25 ENCOUNTER — Ambulatory Visit: Payer: Medicare Other | Admitting: Gastroenterology

## 2017-09-25 DIAGNOSIS — R748 Abnormal levels of other serum enzymes: Secondary | ICD-10-CM

## 2017-09-25 DIAGNOSIS — E113511 Type 2 diabetes mellitus with proliferative diabetic retinopathy with macular edema, right eye: Secondary | ICD-10-CM | POA: Diagnosis not present

## 2017-09-25 DIAGNOSIS — E113591 Type 2 diabetes mellitus with proliferative diabetic retinopathy without macular edema, right eye: Secondary | ICD-10-CM | POA: Diagnosis not present

## 2017-09-25 DIAGNOSIS — H4311 Vitreous hemorrhage, right eye: Secondary | ICD-10-CM | POA: Diagnosis not present

## 2017-09-25 NOTE — Telephone Encounter (Signed)
Pt is aware to go to [facility] for her lab work and will go on Friday.

## 2017-09-25 NOTE — Telephone Encounter (Signed)
Pt has a question for the nurse about her lab work. Please call her at [phone]

## 2017-09-26 DIAGNOSIS — Z09 Encounter for follow-up examination after completed treatment for conditions other than malignant neoplasm: Secondary | ICD-10-CM | POA: Diagnosis not present

## 2017-09-26 DIAGNOSIS — N250 Renal osteodystrophy: Secondary | ICD-10-CM | POA: Diagnosis not present

## 2017-09-26 DIAGNOSIS — N186 End stage renal disease: Secondary | ICD-10-CM | POA: Diagnosis not present

## 2017-09-26 DIAGNOSIS — Z992 Dependence on renal dialysis: Secondary | ICD-10-CM | POA: Diagnosis not present

## 2017-09-26 DIAGNOSIS — H4051X3 Glaucoma secondary to other eye disorders, right eye, severe stage: Secondary | ICD-10-CM | POA: Diagnosis not present

## 2017-09-26 DIAGNOSIS — D631 Anemia in chronic kidney disease: Secondary | ICD-10-CM | POA: Diagnosis not present

## 2017-09-26 DIAGNOSIS — D509 Iron deficiency anemia, unspecified: Secondary | ICD-10-CM | POA: Diagnosis not present

## 2017-09-27 ENCOUNTER — Encounter: Payer: Self-pay | Admitting: Family Medicine

## 2017-09-27 ENCOUNTER — Ambulatory Visit (INDEPENDENT_AMBULATORY_CARE_PROVIDER_SITE_OTHER): Payer: Medicare Other | Admitting: Family Medicine

## 2017-09-27 VITALS — BP 172/78 | HR 58 | Temp 97.7°F | Ht 63.0 in | Wt 146.8 lb

## 2017-09-27 DIAGNOSIS — Z794 Long term (current) use of insulin: Secondary | ICD-10-CM | POA: Diagnosis not present

## 2017-09-27 DIAGNOSIS — L608 Other nail disorders: Secondary | ICD-10-CM

## 2017-09-27 DIAGNOSIS — E11319 Type 2 diabetes mellitus with unspecified diabetic retinopathy without macular edema: Secondary | ICD-10-CM

## 2017-09-27 DIAGNOSIS — Z720 Tobacco use: Secondary | ICD-10-CM

## 2017-09-27 DIAGNOSIS — IMO0001 Reserved for inherently not codable concepts without codable children: Secondary | ICD-10-CM

## 2017-09-27 LAB — HEMOGLOBIN A1C: Hemoglobin A1C: 9.6

## 2017-09-27 LAB — BAYER DCA HB A1C WAIVED: HB A1C (BAYER DCA - WAIVED): 9.6 % — ABNORMAL HIGH (ref <7.0)

## 2017-09-27 MED ORDER — NICOTINE 7 MG/24HR TD PT24: 7.0000 mg | MEDICATED_PATCH | Freq: Every day | TRANSDERMAL | 0 refills | Status: DC

## 2017-09-27 MED ORDER — NICOTINE 14 MG/24HR TD PT24: 14.0000 mg | MEDICATED_PATCH | Freq: Every day | TRANSDERMAL | 0 refills | Status: DC

## 2017-09-27 MED ORDER — INSULIN ASPART 100 UNIT/ML FLEXPEN: 2.0000 [IU] | PEN_INJECTOR | Freq: Three times a day (TID) | SUBCUTANEOUS | 11 refills | Status: DC

## 2017-09-27 MED ORDER — NICOTINE 21 MG/24HR TD PT24: 21.0000 mg | MEDICATED_PATCH | Freq: Every day | TRANSDERMAL | 0 refills | Status: DC

## 2017-09-27 NOTE — Patient Instructions (Signed)
Great to see you!  Start NovoLog 2 units at each meal, we will work on a referral to endocrinology and podiatry for you.

## 2017-09-27 NOTE — Progress Notes (Signed)
HPI  Patient presents today here for follow-up chronic medical conditions.  Tobacco abuse Patient would like to quit She smokes over 1 pack/day. She would like to try the patch.  Diabetes Blood sugar recently has been over 400, we increased her Lantus to 10 units once daily, she is now in the 200s fasting. No hypoglycemia She has had some dietary indiscretion She would like to restart mealtime insulin.  Toenail deformity Patient with what she describes as raised loose toenails which she peeled off.  She now has some scabs and wounds of the bilateral great toes, she requests a specific podiatrist, Dr. Rip Harbour  She is very excited about her recent surgery on her eyes and can see much better  PMH: Smoking status noted ROS: Per HPI  Objective: BP (!) 172/78   Pulse (!) 58   Temp 97.7 F (36.5 C) (Oral)   Ht 5' 3"  (1.6 m)   Wt 146 lb 12.8 oz (66.6 kg)   LMP 05/05/2015   BMI 26.00 kg/m  Gen: NAD, alert, cooperative with exam HEENT: NCAT CV: RRR, good S1/S2, no murmur Resp: CTABL, no wheezes, non-labored Ext: No edema, warm Neuro: Alert and oriented, No gross deficits Diabetic Foot Exam - Simple   Simple Foot Form Diabetic Foot exam was performed with the following findings:  Yes 09/27/2017 10:22 AM  Visual Inspection See comments:  Yes Sensation Testing Intact to touch and monofilament testing bilaterally:  Yes Pulse Check Posterior Tibialis and Dorsalis pulse intact bilaterally:  Yes Comments Patient with toenails apparently peeled off bilateral great and second toes Scabs present       Assessment and plan:  #Diabetes Uncontrolled Patient has recently had increased basal insulin to 10 units, adding mealtime insulin today, NovoLog 2 units each meal. Asked her to record fasting plus postprandial CBGs Refer to endocrinology, appreciate the recommendations and management  #Nail deformity Refer to podiatry  #Tobacco abuse Nicotine patch discussed,  recommended quit line     Orders Placed This Encounter  Procedures  . Bayer DCA Hb A1c Waived  . Ambulatory referral to Podiatry    Referral Priority:   Routine    Referral Type:   Consultation    Referral Reason:   Specialty Services Required    Requested Specialty:   Podiatry    Number of Visits Requested:   1  . Ambulatory referral to Endocrinology    Referral Priority:   Routine    Referral Type:   Consultation    Referral Reason:   Specialty Services Required    Referred to Provider:   Cassandria Anger, MD    Number of Visits Requested:   1    Meds ordered this encounter  Medications  . insulin aspart (NOVOLOG FLEXPEN) 100 UNIT/ML FlexPen    Sig: Inject 2 Units into the skin 3 (three) times daily with meals.    Dispense:  3 mL    Refill:  11  . DISCONTD: nicotine (NICODERM CQ - DOSED IN MG/24 HOURS) 21 mg/24hr patch    Sig: Place 1 patch (21 mg total) onto the skin daily.    Dispense:  42 patch    Refill:  0    21 mg x 6 weeks, 14 mg x 2 week , 27m x 2 weeks then stop  . DISCONTD: nicotine (NICODERM CQ - DOSED IN MG/24 HOURS) 14 mg/24hr patch    Sig: Place 1 patch (14 mg total) onto the skin daily.    Dispense:  14  patch    Refill:  0    21 mg x 6 weeks, 14 mg x 2 week , 23m x 2 weeks then stop  . DISCONTD: nicotine (NICODERM CQ - DOSED IN MG/24 HR) 7 mg/24hr patch    Sig: Place 1 patch (7 mg total) onto the skin daily.    Dispense:  14 patch    Refill:  0    21 mg x 6 weeks, 14 mg x 2 week , 742mx 2 weeks then stop  . DISCONTD: nicotine (NICODERM CQ - DOSED IN MG/24 HOURS) 14 mg/24hr patch    Sig: Place 1 patch (14 mg total) onto the skin daily.    Dispense:  14 patch    Refill:  0    21 mg x 6 weeks, 14 mg x 2 week , 75m73m 2 weeks then stop  . DISCONTD: nicotine (NICODERM CQ - DOSED IN MG/24 HR) 7 mg/24hr patch    Sig: Place 1 patch (7 mg total) onto the skin daily.    Dispense:  14 patch    Refill:  0    21 mg x 6 weeks, 14 mg x 2 week , 75mg275m 2 weeks then stop  . DISCONTD: nicotine (NICODERM CQ - DOSED IN MG/24 HOURS) 21 mg/24hr patch    Sig: Place 1 patch (21 mg total) onto the skin daily.    Dispense:  42 patch    Refill:  0    21 mg x 6 weeks, 14 mg x 2 week , 75mg 72m weeks then stop  . nicotine (NICODERM CQ - DOSED IN MG/24 HOURS) 14 mg/24hr patch    Sig: Place 1 patch (14 mg total) onto the skin daily.    Dispense:  14 patch    Refill:  0  . nicotine (NICODERM CQ - DOSED IN MG/24 HOURS) 21 mg/24hr patch    Sig: Place 1 patch (21 mg total) onto the skin daily.    Dispense:  42 patch    Refill:  0    21 mg x 6 weeks,14 mg x 2 week ,75mg x21mweeks then stop  . nicotine (NICODERM CQ - DOSED IN MG/24 HR) 7 mg/24hr patch    Sig: Place 1 patch (7 mg total) onto the skin daily.    Dispense:  14 patch    Refill:  0    Sam BrLaroy AppleesterOngine 09/27/2017, 10:20 AM

## 2017-09-27 NOTE — Progress Notes (Signed)
[place]

## 2017-09-28 DIAGNOSIS — N250 Renal osteodystrophy: Secondary | ICD-10-CM | POA: Diagnosis not present

## 2017-09-28 DIAGNOSIS — Z992 Dependence on renal dialysis: Secondary | ICD-10-CM | POA: Diagnosis not present

## 2017-09-28 DIAGNOSIS — D509 Iron deficiency anemia, unspecified: Secondary | ICD-10-CM | POA: Diagnosis not present

## 2017-09-28 DIAGNOSIS — D631 Anemia in chronic kidney disease: Secondary | ICD-10-CM | POA: Diagnosis not present

## 2017-09-28 DIAGNOSIS — N186 End stage renal disease: Secondary | ICD-10-CM | POA: Diagnosis not present

## 2017-09-28 LAB — BMP8+EGFR
BUN/Creatinine Ratio: 8 — ABNORMAL LOW (ref 9–23)
BUN: 24 mg/dL (ref 6–24)
CO2: 24 mmol/L (ref 20–29)
CREATININE: 3.05 mg/dL — AB (ref 0.57–1.00)
Calcium: 9 mg/dL (ref 8.7–10.2)
Chloride: 95 mmol/L — ABNORMAL LOW (ref 96–106)
GFR calc Af Amer: 20 mL/min/{1.73_m2} — ABNORMAL LOW (ref >59)
GFR calc non Af Amer: 17 mL/min/{1.73_m2} — ABNORMAL LOW (ref >59)
Glucose: 260 mg/dL — ABNORMAL HIGH (ref 65–99)
POTASSIUM: 4.2 mmol/L (ref 3.5–5.2)
SODIUM: 136 mmol/L (ref 134–144)

## 2017-09-30 ENCOUNTER — Ambulatory Visit: Payer: Medicare Other | Admitting: Family Medicine

## 2017-09-30 DIAGNOSIS — R748 Abnormal levels of other serum enzymes: Secondary | ICD-10-CM | POA: Diagnosis not present

## 2017-09-30 LAB — GAMMA GT: GGT: 89 U/L — ABNORMAL HIGH (ref 3–55)

## 2017-10-02 DIAGNOSIS — N250 Renal osteodystrophy: Secondary | ICD-10-CM | POA: Diagnosis not present

## 2017-10-02 DIAGNOSIS — N186 End stage renal disease: Secondary | ICD-10-CM | POA: Diagnosis not present

## 2017-10-02 DIAGNOSIS — D509 Iron deficiency anemia, unspecified: Secondary | ICD-10-CM | POA: Diagnosis not present

## 2017-10-02 DIAGNOSIS — D631 Anemia in chronic kidney disease: Secondary | ICD-10-CM | POA: Diagnosis not present

## 2017-10-02 DIAGNOSIS — Z992 Dependence on renal dialysis: Secondary | ICD-10-CM | POA: Diagnosis not present

## 2017-10-03 ENCOUNTER — Other Ambulatory Visit: Payer: Self-pay | Admitting: Nurse Practitioner

## 2017-10-03 DIAGNOSIS — R748 Abnormal levels of other serum enzymes: Secondary | ICD-10-CM

## 2017-10-03 DIAGNOSIS — Z992 Dependence on renal dialysis: Secondary | ICD-10-CM | POA: Diagnosis not present

## 2017-10-03 DIAGNOSIS — D509 Iron deficiency anemia, unspecified: Secondary | ICD-10-CM | POA: Diagnosis not present

## 2017-10-03 DIAGNOSIS — D631 Anemia in chronic kidney disease: Secondary | ICD-10-CM | POA: Diagnosis not present

## 2017-10-03 DIAGNOSIS — N186 End stage renal disease: Secondary | ICD-10-CM | POA: Diagnosis not present

## 2017-10-03 DIAGNOSIS — N250 Renal osteodystrophy: Secondary | ICD-10-CM | POA: Diagnosis not present

## 2017-10-05 DIAGNOSIS — Z992 Dependence on renal dialysis: Secondary | ICD-10-CM | POA: Diagnosis not present

## 2017-10-05 DIAGNOSIS — D509 Iron deficiency anemia, unspecified: Secondary | ICD-10-CM | POA: Diagnosis not present

## 2017-10-05 DIAGNOSIS — D631 Anemia in chronic kidney disease: Secondary | ICD-10-CM | POA: Diagnosis not present

## 2017-10-05 DIAGNOSIS — N250 Renal osteodystrophy: Secondary | ICD-10-CM | POA: Diagnosis not present

## 2017-10-05 DIAGNOSIS — N186 End stage renal disease: Secondary | ICD-10-CM | POA: Diagnosis not present

## 2017-10-08 DIAGNOSIS — D631 Anemia in chronic kidney disease: Secondary | ICD-10-CM | POA: Diagnosis not present

## 2017-10-08 DIAGNOSIS — D509 Iron deficiency anemia, unspecified: Secondary | ICD-10-CM | POA: Diagnosis not present

## 2017-10-08 DIAGNOSIS — Z992 Dependence on renal dialysis: Secondary | ICD-10-CM | POA: Diagnosis not present

## 2017-10-08 DIAGNOSIS — N250 Renal osteodystrophy: Secondary | ICD-10-CM | POA: Diagnosis not present

## 2017-10-08 DIAGNOSIS — N186 End stage renal disease: Secondary | ICD-10-CM | POA: Diagnosis not present

## 2017-10-12 DIAGNOSIS — Z992 Dependence on renal dialysis: Secondary | ICD-10-CM | POA: Diagnosis not present

## 2017-10-12 DIAGNOSIS — D631 Anemia in chronic kidney disease: Secondary | ICD-10-CM | POA: Diagnosis not present

## 2017-10-12 DIAGNOSIS — D509 Iron deficiency anemia, unspecified: Secondary | ICD-10-CM | POA: Diagnosis not present

## 2017-10-12 DIAGNOSIS — N186 End stage renal disease: Secondary | ICD-10-CM | POA: Diagnosis not present

## 2017-10-12 DIAGNOSIS — N250 Renal osteodystrophy: Secondary | ICD-10-CM | POA: Diagnosis not present

## 2017-10-15 ENCOUNTER — Other Ambulatory Visit: Payer: Self-pay | Admitting: Family Medicine

## 2017-10-15 DIAGNOSIS — D631 Anemia in chronic kidney disease: Secondary | ICD-10-CM | POA: Diagnosis not present

## 2017-10-15 DIAGNOSIS — D509 Iron deficiency anemia, unspecified: Secondary | ICD-10-CM | POA: Diagnosis not present

## 2017-10-15 DIAGNOSIS — Z992 Dependence on renal dialysis: Secondary | ICD-10-CM | POA: Diagnosis not present

## 2017-10-15 DIAGNOSIS — N186 End stage renal disease: Secondary | ICD-10-CM | POA: Diagnosis not present

## 2017-10-15 DIAGNOSIS — N250 Renal osteodystrophy: Secondary | ICD-10-CM | POA: Diagnosis not present

## 2017-10-16 DIAGNOSIS — N186 End stage renal disease: Secondary | ICD-10-CM | POA: Diagnosis not present

## 2017-10-16 DIAGNOSIS — Z992 Dependence on renal dialysis: Secondary | ICD-10-CM | POA: Diagnosis not present

## 2017-10-16 DIAGNOSIS — E8770 Fluid overload, unspecified: Secondary | ICD-10-CM | POA: Diagnosis not present

## 2017-10-17 DIAGNOSIS — N186 End stage renal disease: Secondary | ICD-10-CM | POA: Diagnosis not present

## 2017-10-17 DIAGNOSIS — Z992 Dependence on renal dialysis: Secondary | ICD-10-CM | POA: Diagnosis not present

## 2017-10-17 DIAGNOSIS — D509 Iron deficiency anemia, unspecified: Secondary | ICD-10-CM | POA: Diagnosis not present

## 2017-10-17 DIAGNOSIS — N250 Renal osteodystrophy: Secondary | ICD-10-CM | POA: Diagnosis not present

## 2017-10-17 DIAGNOSIS — D631 Anemia in chronic kidney disease: Secondary | ICD-10-CM | POA: Diagnosis not present

## 2017-10-19 DIAGNOSIS — N250 Renal osteodystrophy: Secondary | ICD-10-CM | POA: Diagnosis not present

## 2017-10-19 DIAGNOSIS — D509 Iron deficiency anemia, unspecified: Secondary | ICD-10-CM | POA: Diagnosis not present

## 2017-10-19 DIAGNOSIS — Z992 Dependence on renal dialysis: Secondary | ICD-10-CM | POA: Diagnosis not present

## 2017-10-19 DIAGNOSIS — N186 End stage renal disease: Secondary | ICD-10-CM | POA: Diagnosis not present

## 2017-10-19 DIAGNOSIS — D631 Anemia in chronic kidney disease: Secondary | ICD-10-CM | POA: Diagnosis not present

## 2017-10-22 DIAGNOSIS — Z992 Dependence on renal dialysis: Secondary | ICD-10-CM | POA: Diagnosis not present

## 2017-10-22 DIAGNOSIS — N250 Renal osteodystrophy: Secondary | ICD-10-CM | POA: Diagnosis not present

## 2017-10-22 DIAGNOSIS — D631 Anemia in chronic kidney disease: Secondary | ICD-10-CM | POA: Diagnosis not present

## 2017-10-22 DIAGNOSIS — N186 End stage renal disease: Secondary | ICD-10-CM | POA: Diagnosis not present

## 2017-10-22 DIAGNOSIS — Z794 Long term (current) use of insulin: Secondary | ICD-10-CM | POA: Diagnosis not present

## 2017-10-22 DIAGNOSIS — E119 Type 2 diabetes mellitus without complications: Secondary | ICD-10-CM | POA: Diagnosis not present

## 2017-10-22 DIAGNOSIS — D509 Iron deficiency anemia, unspecified: Secondary | ICD-10-CM | POA: Diagnosis not present

## 2017-10-23 ENCOUNTER — Telehealth: Payer: Self-pay

## 2017-10-23 NOTE — Telephone Encounter (Signed)
This can be directed to Dr. Wendi Snipes tomorrow, it is not emergently high.

## 2017-10-23 NOTE — Telephone Encounter (Signed)
Patient called stating her  Fasting BS has been running high- Has been taking her Humalog 4 units instead of 2 units when her sugars are running high.  Would like to be put back on a sliding scale.   3/16 -241 3/19 -253 3/20-263 3/21-168 3/22-200 3/23-136 3/24-291   Covering PCP- please advise and send back to the pools.

## 2017-10-24 ENCOUNTER — Ambulatory Visit: Payer: Medicare Other | Admitting: "Endocrinology

## 2017-10-24 DIAGNOSIS — Z992 Dependence on renal dialysis: Secondary | ICD-10-CM | POA: Diagnosis not present

## 2017-10-24 DIAGNOSIS — N186 End stage renal disease: Secondary | ICD-10-CM | POA: Diagnosis not present

## 2017-10-24 DIAGNOSIS — D631 Anemia in chronic kidney disease: Secondary | ICD-10-CM | POA: Diagnosis not present

## 2017-10-24 DIAGNOSIS — D509 Iron deficiency anemia, unspecified: Secondary | ICD-10-CM | POA: Diagnosis not present

## 2017-10-24 DIAGNOSIS — N250 Renal osteodystrophy: Secondary | ICD-10-CM | POA: Diagnosis not present

## 2017-10-24 NOTE — Telephone Encounter (Signed)
Pt has an appt with Endo 10/28/17 and with Dr Wendi Snipes on 11/01/17 and pt states she cannot be seen any earlier.

## 2017-10-24 NOTE — Telephone Encounter (Signed)
Pt needs OV, I could also recommend an Endocrinologist for DM2.   [name], MD [facility] 10/24/2017, 7:46 AM

## 2017-10-26 DIAGNOSIS — N250 Renal osteodystrophy: Secondary | ICD-10-CM | POA: Diagnosis not present

## 2017-10-26 DIAGNOSIS — Z992 Dependence on renal dialysis: Secondary | ICD-10-CM | POA: Diagnosis not present

## 2017-10-26 DIAGNOSIS — D631 Anemia in chronic kidney disease: Secondary | ICD-10-CM | POA: Diagnosis not present

## 2017-10-26 DIAGNOSIS — D509 Iron deficiency anemia, unspecified: Secondary | ICD-10-CM | POA: Diagnosis not present

## 2017-10-26 DIAGNOSIS — N186 End stage renal disease: Secondary | ICD-10-CM | POA: Diagnosis not present

## 2017-10-27 DIAGNOSIS — N186 End stage renal disease: Secondary | ICD-10-CM | POA: Diagnosis not present

## 2017-10-27 DIAGNOSIS — Z992 Dependence on renal dialysis: Secondary | ICD-10-CM | POA: Diagnosis not present

## 2017-10-28 ENCOUNTER — Other Ambulatory Visit: Payer: Self-pay | Admitting: Gastroenterology

## 2017-10-28 ENCOUNTER — Ambulatory Visit: Payer: Medicare Other | Admitting: "Endocrinology

## 2017-10-28 ENCOUNTER — Other Ambulatory Visit: Payer: Self-pay | Admitting: Family Medicine

## 2017-10-29 DIAGNOSIS — D631 Anemia in chronic kidney disease: Secondary | ICD-10-CM | POA: Diagnosis not present

## 2017-10-29 DIAGNOSIS — N186 End stage renal disease: Secondary | ICD-10-CM | POA: Diagnosis not present

## 2017-10-29 DIAGNOSIS — D509 Iron deficiency anemia, unspecified: Secondary | ICD-10-CM | POA: Diagnosis not present

## 2017-10-29 DIAGNOSIS — N250 Renal osteodystrophy: Secondary | ICD-10-CM | POA: Diagnosis not present

## 2017-10-29 DIAGNOSIS — Z992 Dependence on renal dialysis: Secondary | ICD-10-CM | POA: Diagnosis not present

## 2017-10-30 DIAGNOSIS — E113592 Type 2 diabetes mellitus with proliferative diabetic retinopathy without macular edema, left eye: Secondary | ICD-10-CM | POA: Diagnosis not present

## 2017-10-30 DIAGNOSIS — Z09 Encounter for follow-up examination after completed treatment for conditions other than malignant neoplasm: Secondary | ICD-10-CM | POA: Diagnosis not present

## 2017-10-30 DIAGNOSIS — E113551 Type 2 diabetes mellitus with stable proliferative diabetic retinopathy, right eye: Secondary | ICD-10-CM | POA: Diagnosis not present

## 2017-10-31 DIAGNOSIS — D631 Anemia in chronic kidney disease: Secondary | ICD-10-CM | POA: Diagnosis not present

## 2017-10-31 DIAGNOSIS — Z992 Dependence on renal dialysis: Secondary | ICD-10-CM | POA: Diagnosis not present

## 2017-10-31 DIAGNOSIS — N186 End stage renal disease: Secondary | ICD-10-CM | POA: Diagnosis not present

## 2017-10-31 DIAGNOSIS — N250 Renal osteodystrophy: Secondary | ICD-10-CM | POA: Diagnosis not present

## 2017-10-31 DIAGNOSIS — D509 Iron deficiency anemia, unspecified: Secondary | ICD-10-CM | POA: Diagnosis not present

## 2017-11-01 ENCOUNTER — Encounter: Payer: Self-pay | Admitting: Family Medicine

## 2017-11-01 ENCOUNTER — Ambulatory Visit (INDEPENDENT_AMBULATORY_CARE_PROVIDER_SITE_OTHER): Payer: Medicare Other | Admitting: Family Medicine

## 2017-11-01 VITALS — BP 134/76 | HR 57 | Temp 97.0°F | Ht 63.0 in | Wt 141.4 lb

## 2017-11-01 DIAGNOSIS — E11319 Type 2 diabetes mellitus with unspecified diabetic retinopathy without macular edema: Secondary | ICD-10-CM | POA: Diagnosis not present

## 2017-11-01 DIAGNOSIS — R109 Unspecified abdominal pain: Secondary | ICD-10-CM | POA: Diagnosis not present

## 2017-11-01 DIAGNOSIS — IMO0001 Reserved for inherently not codable concepts without codable children: Secondary | ICD-10-CM

## 2017-11-01 DIAGNOSIS — Z794 Long term (current) use of insulin: Secondary | ICD-10-CM

## 2017-11-01 MED ORDER — INSULIN GLARGINE 100 UNIT/ML SOLOSTAR PEN: 12.0000 [IU] | PEN_INJECTOR | Freq: Every day | SUBCUTANEOUS | 5 refills | Status: DC

## 2017-11-01 MED ORDER — CYCLOBENZAPRINE HCL 5 MG PO TABS: 5.0000 mg | ORAL_TABLET | Freq: Three times a day (TID) | ORAL | 0 refills | Status: DC | PRN

## 2017-11-01 MED ORDER — INSULIN ASPART 100 UNIT/ML FLEXPEN: 4.0000 [IU] | PEN_INJECTOR | Freq: Three times a day (TID) | SUBCUTANEOUS | 11 refills | Status: DC

## 2017-11-01 NOTE — Progress Notes (Signed)
   HPI  Patient presents today follow-up for diabetes.  Patient also has left sided abdominal pain. Patient states that her abdominal pain started Tuesday after flexing forward to adjust her shoes after dialysis.  She states she had a little cramp in the left side which did not improve, later that night she had another similar pain then it persisted to bother her this time.  She feels that she has pulled a muscle. She states that she is improved somewhat.  She did well with 5 mg of Flexeril and is slowly improving. Hurts worse with walking or twisting.  Diabetes Patient has increased her Humalog to 4 units every meal. Patient has been taking 10 units of Lantus daily. No hypoglycemia. Average postprandial reported is 160-170. Patient reports that blood sugars listed below from phone call are fasting.  PMH: Smoking status noted ROS: Per HPI  Objective: BP 134/76   Pulse (!) 57   Temp (!) 97 F (36.1 C) (Oral)   Ht 5' 3"  (1.6 m)   Wt 141 lb 6.4 oz (64.1 kg)   LMP 05/05/2015   SpO2 99%   BMI 25.05 kg/m  Gen: NAD, alert, cooperative with exam HEENT: NCAT CV: RRR, good S1/S2, no murmur Resp: CTABL, no wheezes, non-labored Abdomen/MSK: Tenderness to palpation along left flank left lower rib edge, no midline tenderness over the lumbar spine or paraspinal muscles Ext: No edema, warm Neuro: Alert and oriented, No gross deficits  CBGs from phone call 3/16 -241 3/19 -253 3/20-263 3/21-168 3/22-200 3/23-136 3/24-291    Assessment and plan:  #Type 2 diabetes Postprandials we have limited numbers but they do sound controlled, no changes to Humalog dose, 4 units per meal. Titrate Lantus slightly to 12 units/day. Follow-up with endocrinology as scheduled  #Left-sided abdominal pain-new acute problem Consistent with abdominal wall pain Improving slowly Use muscle relaxer cautiously as needed Ice, return to clinic with any concerns    Meds ordered this encounter    Medications  . Insulin Glargine (LANTUS SOLOSTAR) 100 UNIT/ML Solostar Pen    Sig: Inject 12 Units into the skin daily at 10 pm.    Dispense:  1 pen    Refill:  5  . insulin aspart (NOVOLOG FLEXPEN) 100 UNIT/ML FlexPen    Sig: Inject 4 Units into the skin 3 (three) times daily with meals.    Dispense:  3 mL    Refill:  11  . cyclobenzaprine (FLEXERIL) 5 MG tablet    Sig: Take 1 tablet (5 mg total) by mouth 3 (three) times daily as needed for muscle spasms.    Dispense:  30 tablet    Refill:  0    [name], MD [facility] 11/01/2017, 11:32 AM

## 2017-11-02 DIAGNOSIS — N186 End stage renal disease: Secondary | ICD-10-CM | POA: Diagnosis not present

## 2017-11-02 DIAGNOSIS — D631 Anemia in chronic kidney disease: Secondary | ICD-10-CM | POA: Diagnosis not present

## 2017-11-02 DIAGNOSIS — D509 Iron deficiency anemia, unspecified: Secondary | ICD-10-CM | POA: Diagnosis not present

## 2017-11-02 DIAGNOSIS — N250 Renal osteodystrophy: Secondary | ICD-10-CM | POA: Diagnosis not present

## 2017-11-02 DIAGNOSIS — Z992 Dependence on renal dialysis: Secondary | ICD-10-CM | POA: Diagnosis not present

## 2017-11-05 DIAGNOSIS — Z992 Dependence on renal dialysis: Secondary | ICD-10-CM | POA: Diagnosis not present

## 2017-11-05 DIAGNOSIS — D631 Anemia in chronic kidney disease: Secondary | ICD-10-CM | POA: Diagnosis not present

## 2017-11-05 DIAGNOSIS — N250 Renal osteodystrophy: Secondary | ICD-10-CM | POA: Diagnosis not present

## 2017-11-05 DIAGNOSIS — N186 End stage renal disease: Secondary | ICD-10-CM | POA: Diagnosis not present

## 2017-11-05 DIAGNOSIS — D509 Iron deficiency anemia, unspecified: Secondary | ICD-10-CM | POA: Diagnosis not present

## 2017-11-07 DIAGNOSIS — N250 Renal osteodystrophy: Secondary | ICD-10-CM | POA: Diagnosis not present

## 2017-11-07 DIAGNOSIS — N186 End stage renal disease: Secondary | ICD-10-CM | POA: Diagnosis not present

## 2017-11-07 DIAGNOSIS — D631 Anemia in chronic kidney disease: Secondary | ICD-10-CM | POA: Diagnosis not present

## 2017-11-07 DIAGNOSIS — D509 Iron deficiency anemia, unspecified: Secondary | ICD-10-CM | POA: Diagnosis not present

## 2017-11-07 DIAGNOSIS — Z992 Dependence on renal dialysis: Secondary | ICD-10-CM | POA: Diagnosis not present

## 2017-11-09 DIAGNOSIS — N250 Renal osteodystrophy: Secondary | ICD-10-CM | POA: Diagnosis not present

## 2017-11-09 DIAGNOSIS — D631 Anemia in chronic kidney disease: Secondary | ICD-10-CM | POA: Diagnosis not present

## 2017-11-09 DIAGNOSIS — D509 Iron deficiency anemia, unspecified: Secondary | ICD-10-CM | POA: Diagnosis not present

## 2017-11-09 DIAGNOSIS — Z992 Dependence on renal dialysis: Secondary | ICD-10-CM | POA: Diagnosis not present

## 2017-11-09 DIAGNOSIS — N186 End stage renal disease: Secondary | ICD-10-CM | POA: Diagnosis not present

## 2017-11-12 DIAGNOSIS — D631 Anemia in chronic kidney disease: Secondary | ICD-10-CM | POA: Diagnosis not present

## 2017-11-12 DIAGNOSIS — Z992 Dependence on renal dialysis: Secondary | ICD-10-CM | POA: Diagnosis not present

## 2017-11-12 DIAGNOSIS — D509 Iron deficiency anemia, unspecified: Secondary | ICD-10-CM | POA: Diagnosis not present

## 2017-11-12 DIAGNOSIS — N250 Renal osteodystrophy: Secondary | ICD-10-CM | POA: Diagnosis not present

## 2017-11-12 DIAGNOSIS — N186 End stage renal disease: Secondary | ICD-10-CM | POA: Diagnosis not present

## 2017-11-14 DIAGNOSIS — Z992 Dependence on renal dialysis: Secondary | ICD-10-CM | POA: Diagnosis not present

## 2017-11-14 DIAGNOSIS — D509 Iron deficiency anemia, unspecified: Secondary | ICD-10-CM | POA: Diagnosis not present

## 2017-11-14 DIAGNOSIS — N186 End stage renal disease: Secondary | ICD-10-CM | POA: Diagnosis not present

## 2017-11-14 DIAGNOSIS — D631 Anemia in chronic kidney disease: Secondary | ICD-10-CM | POA: Diagnosis not present

## 2017-11-14 DIAGNOSIS — N250 Renal osteodystrophy: Secondary | ICD-10-CM | POA: Diagnosis not present

## 2017-11-15 ENCOUNTER — Ambulatory Visit: Payer: Medicare Other | Admitting: "Endocrinology

## 2017-11-16 DIAGNOSIS — N186 End stage renal disease: Secondary | ICD-10-CM | POA: Diagnosis not present

## 2017-11-16 DIAGNOSIS — N250 Renal osteodystrophy: Secondary | ICD-10-CM | POA: Diagnosis not present

## 2017-11-16 DIAGNOSIS — D631 Anemia in chronic kidney disease: Secondary | ICD-10-CM | POA: Diagnosis not present

## 2017-11-16 DIAGNOSIS — Z992 Dependence on renal dialysis: Secondary | ICD-10-CM | POA: Diagnosis not present

## 2017-11-16 DIAGNOSIS — D509 Iron deficiency anemia, unspecified: Secondary | ICD-10-CM | POA: Diagnosis not present

## 2017-11-19 ENCOUNTER — Other Ambulatory Visit: Payer: Self-pay | Admitting: Family Medicine

## 2017-11-19 DIAGNOSIS — D509 Iron deficiency anemia, unspecified: Secondary | ICD-10-CM | POA: Diagnosis not present

## 2017-11-19 DIAGNOSIS — N186 End stage renal disease: Secondary | ICD-10-CM | POA: Diagnosis not present

## 2017-11-19 DIAGNOSIS — D631 Anemia in chronic kidney disease: Secondary | ICD-10-CM | POA: Diagnosis not present

## 2017-11-19 DIAGNOSIS — Z992 Dependence on renal dialysis: Secondary | ICD-10-CM | POA: Diagnosis not present

## 2017-11-19 DIAGNOSIS — N250 Renal osteodystrophy: Secondary | ICD-10-CM | POA: Diagnosis not present

## 2017-11-20 ENCOUNTER — Encounter: Payer: Self-pay | Admitting: Podiatry

## 2017-11-20 ENCOUNTER — Ambulatory Visit (INDEPENDENT_AMBULATORY_CARE_PROVIDER_SITE_OTHER): Payer: Medicare Other | Admitting: Podiatry

## 2017-11-20 VITALS — BP 130/67 | HR 64

## 2017-11-20 DIAGNOSIS — B351 Tinea unguium: Secondary | ICD-10-CM | POA: Diagnosis not present

## 2017-11-20 DIAGNOSIS — E1142 Type 2 diabetes mellitus with diabetic polyneuropathy: Secondary | ICD-10-CM | POA: Diagnosis not present

## 2017-11-20 NOTE — Progress Notes (Signed)
This patient presents to the office with chief complaint of long thick nails and diabetic feet.  This patient  says he is having no pain and discomfort in his feet.  This patient says he has long thick nails  4,5  B/L.  These nails are painful walking and wearing shoes. She has no history of infection or drainage from both feet.  This patient presents the office today for treatment of the  long nails and a foot evaluation due to history of  diabetes.  General Appearance  Alert, conversant and in no acute stress.  Vascular  Dorsalis pedis and posterior tibial  pulses are palpable  bilaterally.  Capillary return is within normal limits  bilaterally. Temperature is within normal limits  bilaterally.  Neurologic  Senn-Weinstein monofilament wire test diminished   bilaterally. Muscle power within normal limits bilaterally.  Nails Thick disfigured discolored nails with subungual debris  from 4,5 toes   bilaterally. No evidence of bacterial infection or drainage bilaterally.  Orthopedic  No limitations of motion of motion feet .  No crepitus or effusions noted. HAV  B/L  Skin  normotropic skin with no porokeratosis noted bilaterally.  No signs of infections or ulcers noted.  Plantar fibroma right foot asymptomatic.     Diabetes with no foot complications  IE    A diabetic foot exam was performed and there is no evidence of any vascular  pathology.  LOPS diminished  RTC 3 months.   Gardiner Barefoot DPM

## 2017-11-21 DIAGNOSIS — N250 Renal osteodystrophy: Secondary | ICD-10-CM | POA: Diagnosis not present

## 2017-11-21 DIAGNOSIS — N186 End stage renal disease: Secondary | ICD-10-CM | POA: Diagnosis not present

## 2017-11-21 DIAGNOSIS — D631 Anemia in chronic kidney disease: Secondary | ICD-10-CM | POA: Diagnosis not present

## 2017-11-21 DIAGNOSIS — D509 Iron deficiency anemia, unspecified: Secondary | ICD-10-CM | POA: Diagnosis not present

## 2017-11-21 DIAGNOSIS — Z992 Dependence on renal dialysis: Secondary | ICD-10-CM | POA: Diagnosis not present

## 2017-11-23 DIAGNOSIS — D509 Iron deficiency anemia, unspecified: Secondary | ICD-10-CM | POA: Diagnosis not present

## 2017-11-23 DIAGNOSIS — N250 Renal osteodystrophy: Secondary | ICD-10-CM | POA: Diagnosis not present

## 2017-11-23 DIAGNOSIS — N186 End stage renal disease: Secondary | ICD-10-CM | POA: Diagnosis not present

## 2017-11-23 DIAGNOSIS — D631 Anemia in chronic kidney disease: Secondary | ICD-10-CM | POA: Diagnosis not present

## 2017-11-23 DIAGNOSIS — Z992 Dependence on renal dialysis: Secondary | ICD-10-CM | POA: Diagnosis not present

## 2017-11-26 DIAGNOSIS — D631 Anemia in chronic kidney disease: Secondary | ICD-10-CM | POA: Diagnosis not present

## 2017-11-26 DIAGNOSIS — N186 End stage renal disease: Secondary | ICD-10-CM | POA: Diagnosis not present

## 2017-11-26 DIAGNOSIS — N250 Renal osteodystrophy: Secondary | ICD-10-CM | POA: Diagnosis not present

## 2017-11-26 DIAGNOSIS — D509 Iron deficiency anemia, unspecified: Secondary | ICD-10-CM | POA: Diagnosis not present

## 2017-11-26 DIAGNOSIS — Z992 Dependence on renal dialysis: Secondary | ICD-10-CM | POA: Diagnosis not present

## 2017-11-27 ENCOUNTER — Ambulatory Visit (INDEPENDENT_AMBULATORY_CARE_PROVIDER_SITE_OTHER): Payer: Medicare Other | Admitting: "Endocrinology

## 2017-11-27 ENCOUNTER — Encounter: Payer: Self-pay | Admitting: "Endocrinology

## 2017-11-27 VITALS — BP 166/82 | HR 65 | Ht 63.0 in | Wt 146.0 lb

## 2017-11-27 DIAGNOSIS — E1122 Type 2 diabetes mellitus with diabetic chronic kidney disease: Secondary | ICD-10-CM

## 2017-11-27 DIAGNOSIS — F17200 Nicotine dependence, unspecified, uncomplicated: Secondary | ICD-10-CM

## 2017-11-27 DIAGNOSIS — E782 Mixed hyperlipidemia: Secondary | ICD-10-CM | POA: Diagnosis not present

## 2017-11-27 DIAGNOSIS — I10 Essential (primary) hypertension: Secondary | ICD-10-CM | POA: Diagnosis not present

## 2017-11-27 DIAGNOSIS — N186 End stage renal disease: Secondary | ICD-10-CM | POA: Diagnosis not present

## 2017-11-27 MED ORDER — INSULIN GLARGINE 100 UNIT/ML SOLOSTAR PEN: 15.0000 [IU] | PEN_INJECTOR | Freq: Every day | SUBCUTANEOUS | 2 refills | Status: DC

## 2017-11-27 MED ORDER — FREESTYLE LIBRE 14 DAY READER DEVI: 1.0000 | Freq: Once | 0 refills | Status: AC

## 2017-11-27 MED ORDER — FREESTYLE LIBRE 14 DAY SENSOR MISC: 1.0000 | 2 refills | Status: AC

## 2017-11-27 NOTE — Patient Instructions (Signed)

## 2017-11-27 NOTE — Progress Notes (Signed)
Endocrinology Consult Note       11/28/2017, 8:46 AM   Subjective:    Patient ID: Kara Knapp, female    DOB: [birth year].  Kara Knapp is being seen in consultation for management of currently uncontrolled symptomatic diabetes requested by  Timmothy Euler, MD.   Past Medical History:  Diagnosis Date  . Anemia of chronic disease   . Asthma   . Blind left eye   . Bronchitis   . Cataract   . Cholecystitis, acute 05/26/2013   Status post cholecystectomy  . Chronic abdominal pain   . Chronic diarrhea   . Depression   . Diabetic foot ulcer ([place]) 03/01/2015  . Diastolic heart failure ([place])   . ESRD on hemodialysis ([place])    Started diaylsis 12/29/15  . Essential hypertension   . Fibroids   . Glaucoma   . History of blood transfusion   . History of pneumonia   . Hyperlipidemia   . Insulin-dependent diabetes mellitus with retinopathy ([place])   . Neuropathy   . Osteomyelitis ([place])    Toe on left foot   Past Surgical History:  Procedure Laterality Date  . A/V SHUNTOGRAM N/A 10/25/2016   Procedure: A/V Shuntogram - Right Arm;  Surgeon: Waynetta Sandy, MD;  Location: [facility];  Service: Cardiovascular;  Laterality: N/A;  . AV FISTULA PLACEMENT Right 10/17/2015   Procedure: INSERTION OF ARTERIOVENOUS GORE-TEX GRAFT RIGHT UPPER ARM WITH ACUSEAL;  Surgeon: Conrad [place], MD;  Location: [facility];  Service: Vascular;  Laterality: Right;  . CATARACT EXTRACTION W/ INTRAOCULAR LENS IMPLANT Bilateral   . CESAREAN SECTION    . CHOLECYSTECTOMY N/A 05/25/2013   Procedure: LAPAROSCOPIC CHOLECYSTECTOMY;  Surgeon: Jamesetta So, MD;  Location: [facility];  Service: General;  Laterality: N/A;  . COLONOSCOPY WITH PROPOFOL N/A 10/02/2016   Procedure: COLONOSCOPY WITH PROPOFOL;  Surgeon: Danie Binder, MD;  [facility];  Service: Endoscopy;  Laterality: N/A;  145 - pt knows to arrive at 11:15  per office  . ESOPHAGOGASTRODUODENOSCOPY (EGD) WITH PROPOFOL N/A 10/02/2016   Procedure: ESOPHAGOGASTRODUODENOSCOPY (EGD) WITH PROPOFOL;  Surgeon: Danie Binder, MD;  [facility];  Service: Endoscopy;  Laterality: N/A;  . EYE SURGERY Bilateral   . PARS PLANA VITRECTOMY Left 11/24/2014   Procedure: PARS PLANA VITRECTOMY WITH 25 GAUGE;  Surgeon: Hurman Horn, MD;  Location: [facility];  Service: Ophthalmology;  Laterality: Left;  . PERIPHERAL VASCULAR CATHETERIZATION N/A 04/28/2015   Procedure: Bilateral Upper Extremity Venography;  Surgeon: Conrad [place], MD;  Location: [facility];  Service: Cardiovascular;  Laterality: N/A;  . PHOTOCOAGULATION WITH LASER Left 11/24/2014   Procedure: PHOTOCOAGULATION WITH LASER;  Surgeon: Hurman Horn, MD;  Location: [facility];  Service: Ophthalmology;  Laterality: Left;  with insertion of silicone oil  . SAVORY DILATION N/A 10/02/2016   Procedure: SAVORY DILATION;  Surgeon: Danie Binder, MD;  [facility];  Service: Endoscopy;  Laterality: N/A;  . TUBAL LIGATION     Social History   Socioeconomic History  . Marital status: Single  Spouse name: Not on file  . Number of children: 5  . Years of education: GED  . Highest education level: Not on file  Occupational History  . Not on file  Social Needs  . Financial resource strain: Not on file  . Food insecurity:    Worry: Not on file    Inability: Not on file  . Transportation needs:    Medical: Not on file    Non-medical: Not on file  Tobacco Use  . Smoking status: Current Every Day Smoker    Packs/day: 1.50    Years: 23.00    Pack years: 34.50    Types: Cigarettes  . Smokeless tobacco: Never Used  . Tobacco comment: one pack daily  Substance and Sexual Activity  . Alcohol use: No    Alcohol/week: 0.0 oz    Frequency: Never  . Drug use: No    Comment: Sober for 8 years  . Sexual activity: Not Currently    Birth control/protection: Surgical  Lifestyle  . Physical  activity:    Days per week: Not on file    Minutes per session: Not on file  . Stress: Not on file  Relationships  . Social connections:    Talks on phone: Not on file    Gets together: Not on file    Attends religious service: Not on file    Active member of club or organization: Not on file    Attends meetings of clubs or organizations: Not on file    Relationship status: Not on file  Other Topics Concern  . Not on file  Social History Narrative  . Not on file   Outpatient Encounter Medications as of 11/27/2017  Medication Sig  . acetaminophen (TYLENOL) 325 MG tablet Take 650 mg by mouth every 6 (six) hours as needed for mild pain.  Marland Kitchen aspirin 81 MG tablet Take 81 mg by mouth daily.  . carvedilol (COREG) 12.5 MG tablet TAKE ONE TABLET BY MOUTH TWICE DAILY  . cholecalciferol (VITAMIN D) 1000 units tablet Take 1,000 Units by mouth daily.   . citalopram (CELEXA) 20 MG tablet TAKE ONE TABLET BY MOUTH DAILY.  . [EXPIRED] Continuous Blood Gluc Receiver (FREESTYLE LIBRE 14 DAY READER) DEVI 1 each by Does not apply route once for 1 dose.  . Continuous Blood Gluc Sensor (FREESTYLE LIBRE 14 DAY SENSOR) MISC Inject 1 each into the skin every 14 (fourteen) days. Use as directed.  . cyclobenzaprine (FLEXERIL) 5 MG tablet Take 1 tablet (5 mg total) by mouth 3 (three) times daily as needed for muscle spasms.  Marland Kitchen dicyclomine (BENTYL) 10 MG capsule TAKE ONE CAPSULE BY MOUTH FOUR TIMES DAILY BEFORE MEALS AND AT BEDTIME AS NEEDED FOR DIARRHEA, HOLD FOR CONSTIPATION.  . fluticasone (FLONASE) 50 MCG/ACT nasal spray Place 2 sprays into both nostrils daily.  Marland Kitchen gabapentin (NEURONTIN) 100 MG capsule TAKE ONE CAPSULE BY MOUTH THREE TIMES DAILY  . glucose blood (PRODIGY NO CODING BLOOD GLUC) test strip USE TO CHECK BLOOD SUGAR TWICE DAILY.  . hydrALAZINE (APRESOLINE) 25 MG tablet TAKE ONE TABLET BY MOUTH THREE TIMES DAILY.  Marland Kitchen Insulin Glargine (LANTUS SOLOSTAR) 100 UNIT/ML Solostar Pen Inject 15 Units into the  skin daily at 10 pm.  . Insulin Pen Needle (PEN NEEDLES) 31G X 5 MM MISC 1 Device by Does not apply route daily.  . nicotine (NICODERM CQ - DOSED IN MG/24 HOURS) 14 mg/24hr patch Place 1 patch (14 mg total) onto the skin daily.  . nicotine (  NICODERM CQ - DOSED IN MG/24 HOURS) 21 mg/24hr patch Place 1 patch (21 mg total) onto the skin daily.  . nicotine (NICODERM CQ - DOSED IN MG/24 HR) 7 mg/24hr patch Place 1 patch (7 mg total) onto the skin daily.  Marland Kitchen ofloxacin (OCUFLOX) 0.3 % ophthalmic solution Place 1 drop into the right eye 4 (four) times daily as needed.  . Probiotic Product (RESTORA) CAPS Take 1 capsule by mouth daily.  Marland Kitchen PRODIGY TWIST TOP LANCETS 28G MISC USE TO CHECK BLOOD SUGAR UP TO FOUR TIMES DAILY.  Marland Kitchen PROTONIX 40 MG tablet TAKE 1 BY MOUTH DAILY  . sevelamer carbonate (RENVELA) 800 MG tablet Take 2,400 mg by mouth 3 (three) times daily with meals. And with snacks.  . traZODone (DESYREL) 50 MG tablet TAKE 1/2 TO 1 TABLET BY MOUTH AT BEDTIME AS NEEDED FOR SLEEP  . umeclidinium-vilanterol (ANORO ELLIPTA) 62.5-25 MCG/INH AEPB Inhale 1 puff into the lungs daily.  . [DISCONTINUED] insulin aspart (NOVOLOG FLEXPEN) 100 UNIT/ML FlexPen Inject 4 Units into the skin 3 (three) times daily with meals.  . [DISCONTINUED] Insulin Glargine (LANTUS SOLOSTAR) 100 UNIT/ML Solostar Pen Inject 12 Units into the skin daily at 10 pm.   No facility-administered encounter medications on file as of 11/27/2017.     ALLERGIES: Allergies  Allergen Reactions  . Bactrim [Sulfamethoxazole-Trimethoprim] Nausea And Vomiting  . Prednisone Other (See Comments)    "I was wide open and couldn't eat" per pt.     VACCINATION STATUS: Immunization History  Administered Date(s) Administered  . Influenza,inj,Quad PF,6+ Mos 05/22/2013, 05/11/2015  . Influenza-Unspecified 04/10/2016  . Pneumococcal Polysaccharide-23 05/22/2013  . Tdap 12/21/2014    Diabetes  She presents for her initial diabetic visit. She has type 2  diabetes mellitus. Onset time: She was diagnosed at approximately age of 69 years. Her disease course has been worsening. There are no hypoglycemic associated symptoms. Pertinent negatives for hypoglycemia include no confusion, headaches, pallor or seizures. Associated symptoms include fatigue, foot paresthesias and visual change. Pertinent negatives for diabetes include no chest pain, no polydipsia, no polyphagia and no polyuria. There are no hypoglycemic complications. Symptoms are worsening. Diabetic complications include nephropathy, peripheral neuropathy and retinopathy. (She has end-stage renal disease on hemodialysis since age of 34 years.  She has very little vision left on the right eye, legally blind on the left.  ) Risk factors for coronary artery disease include diabetes mellitus, hypertension, sedentary lifestyle and tobacco exposure. Current diabetic treatment includes insulin injections (She is currently on Lantus 12 units at bedtime and Humalog 4 units 3 times daily AC.  She does this insulin by counting the number of clicks on her pain due to visual impairment.). Compliance with diabetes treatment: Has documented history of noncompliance/nonadherence. Her weight is fluctuating minimally. She is following a generally unhealthy diet. When asked about meal planning, she reported none. She has not had a previous visit with a dietitian. She never participates in exercise. (She did not bring any meter nor walks to review.  She says she uses Prodigy meter, denies blood glucose readings less than 70.) An ACE inhibitor/angiotensin II receptor blocker is not being taken. She does not see a podiatrist.Eye exam is current.  Hypertension  This is a chronic problem. The current episode started more than 1 year ago. The problem is uncontrolled. Pertinent negatives include no chest pain, headaches, palpitations or shortness of breath. Risk factors for coronary artery disease include smoking/tobacco exposure,  sedentary lifestyle and diabetes mellitus. Past treatments include beta  blockers. Hypertensive end-organ damage includes kidney disease and retinopathy. Identifiable causes of hypertension include chronic renal disease.      Review of Systems  Constitutional: Positive for fatigue. Negative for chills, fever and unexpected weight change.  HENT: Negative for trouble swallowing and voice change.   Eyes: Negative for visual disturbance.  Respiratory: Negative for cough, shortness of breath and wheezing.   Cardiovascular: Negative for chest pain, palpitations and leg swelling.  Gastrointestinal: Negative for diarrhea, nausea and vomiting.  Endocrine: Negative for cold intolerance, heat intolerance, polydipsia, polyphagia and polyuria.  Musculoskeletal: Positive for gait problem. Negative for arthralgias and myalgias.       She uses a walker mainly due to her visual impairment.  Skin: Negative for color change, pallor, rash and wound.  Neurological: Negative for seizures and headaches.  Psychiatric/Behavioral: Negative for confusion and suicidal ideas.    Objective:    BP (!) 166/82   Pulse 65   Ht 5' 3"  (1.6 m)   Wt 146 lb (66.2 kg)   LMP 05/05/2015   BMI 25.86 kg/m   Wt Readings from Last 3 Encounters:  11/27/17 146 lb (66.2 kg)  11/01/17 141 lb 6.4 oz (64.1 kg)  09/27/17 146 lb 12.8 oz (66.6 kg)     Physical Exam  Constitutional: She is oriented to person, place, and time. She appears well-developed.  HENT:  Head: Normocephalic and atraumatic.  Eyes: EOM are normal.  Neck: Normal range of motion. Neck supple. No tracheal deviation present. No thyromegaly present.  Cardiovascular: Normal rate and regular rhythm.  Pulmonary/Chest: Effort normal and breath sounds normal.  Abdominal: Soft. Bowel sounds are normal. There is no tenderness. There is no guarding.  Musculoskeletal: Normal range of motion. She exhibits no edema.  Neurological: She is alert and oriented to person,  place, and time. She displays abnormal reflex. A cranial nerve deficit and sensory deficit is present. Coordination normal.  She has very little vision on the right eye, legally blind on the left.  Skin: Skin is warm and dry. No rash noted. No erythema. No pallor.  Psychiatric: She has a normal mood and affect. Judgment normal.      CMP ( most recent) CMP     Component Value Date/Time   NA 136 09/27/2017 1030   K 4.2 09/27/2017 1030   CL 95 (L) 09/27/2017 1030   CO2 24 09/27/2017 1030   GLUCOSE 260 (H) 09/27/2017 1030   GLUCOSE 472 (H) 09/23/2017 1119   BUN 24 09/27/2017 1030   CREATININE 3.05 (HH) 09/27/2017 1030   CREATININE 3.97 (H) 09/23/2017 1119   CALCIUM 9.0 09/27/2017 1030   PROT 6.1 09/23/2017 1119   PROT 6.0 05/13/2017 1437   ALBUMIN 3.2 (L) 09/07/2017 1028   ALBUMIN 3.5 05/13/2017 1437   AST 17 09/23/2017 1119   ALT 13 09/23/2017 1119   ALKPHOS 343 (H) 09/07/2017 1028   BILITOT 0.5 09/23/2017 1119   BILITOT 0.4 05/13/2017 1437   GFRNONAA 17 (L) 09/27/2017 1030   GFRAA 20 (L) 09/27/2017 1030     Diabetic Labs (most recent): Lab Results  Component Value Date   HGBA1C 9.6 09/27/2017   HGBA1C 7.4 09/06/2015   HGBA1C 6.5 06/10/2015     Lipid Panel ( most recent) Lipid Panel     Component Value Date/Time   CHOL 178 06/10/2015 1055   TRIG 94 06/10/2015 1055   HDL 46 06/10/2015 1055   CHOLHDL 3.9 06/10/2015 1055   LDLCALC 113 (H) 06/10/2015 1055  Lab Results  Component Value Date   TSH 4.668 (H) 01/21/2015           Assessment & Plan:   1. Type 2 diabetes mellitus with ESRD (end-stage renal disease) , and advanced retinopathy with legal blindness  - Jaquana A Alas has currently uncontrolled symptomatic type 2 DM since 47 years of age,  with most recent A1c of 9.6 %. Recent labs reviewed.  -her diabetes is complicated by end-stage renal disease on hemodialysis since age 47, legal blindness due to advanced retinopathy, peripheral  neuropathy and Candise A Buckingham remains at a high risk for more acute and chronic complications which include CAD, CVA, CKD, retinopathy, and neuropathy. These are all discussed in detail with the patient.  - I have counseled her on diet management by adopting a carbohydrate restricted/protein rich diet.  - Suggestion is made for her to avoid simple carbohydrates  from her diet including Cakes, Sweet Desserts, Ice Cream, Soda (diet and regular), Sweet Tea, Candies, Chips, Cookies, Store Bought Juices, Alcohol in Excess of  1-2 drinks a day, Artificial Sweeteners, and "Sugar-free" Products. This will help patient to have stable blood glucose profile and potentially avoid unintended weight gain.  - I encouraged her to switch to  unprocessed or minimally processed complex starch and increased protein intake (animal or plant source), fruits, and vegetables.  - she is advised to stick to a routine mealtimes to eat 3 meals  a day and avoid unnecessary snacks ( to snack only to correct hypoglycemia).   - she will be scheduled with Jearld Fenton, RDN, CDE for individualized diabetes education.  - I have approached her with the following individualized plan to manage diabetes and patient agrees:   -This patient is at extremely high risk for hypoglycemia due to visual impairment.  #1 goal in her treatment should be to avoid hypoglycemia, by simplifying her insulin treatment as much as possible. -She has a few family members that she says she can ask for help specifically for dosing the insulin.   - I requested for her to start monitoring blood glucose 4 times a day-before meals and at bedtime and return in 1 week with her meter to review.  -If she requires multiple daily injections of insulin, she would benefit from premixed insulin such as Humalog 75/25 or NovoLog 78/67 for simplicity reasons.  -In the meantime I have simplified her insulin treatment to only Lantus 15 units at bedtime and hold Humalog  until next visit.   -She is not a suitable candidate for metformin, SGLT2 inhibitors, nor incretin therapy.   -Patient is encouraged to call clinic for blood glucose levels less than 70 or above 300 mg /dl. -  - Patient specific target  A1c;  LDL, HDL, Triglycerides, and  Waist Circumference were discussed in detail.  2) BP/HTN: Her blood pressure is not controlled to target.  He is currently on beta-blockers.  I advised her to discuss it during her nephrology evaluation during hemodialysis.    3) Lipids/HPL:   Her recent lipid panel show uncontrolled LDL of 113.   Patient is not on statins, will be considered for low-dose statins. 4)  Weight/Diet: She does not have weight to lose, however would benefit from diabetes education.  CDE Consult will be initiated , exercise, and detailed carbohydrates information provided.  5) Chronic Care/Health Maintenance:  -she  Is not on ACEI/ARB and Statin medications and  is encouraged to continue to follow up with Ophthalmology, Dentist, nephrology, podiatrist at  least yearly or according to recommendations, and advised to  quit smoking. I have recommended yearly flu vaccine and pneumonia vaccination at least every 5 years; moderate intensity exercise for up to 150 minutes weekly; and  sleep for at least 7 hours a day.  - I advised patient to maintain close follow up with Timmothy Euler, MD for primary care needs.  - Time spent with the patient: 45 minutes, of which >50% was spent in obtaining information about her symptoms, reviewing her previous labs, evaluations, and treatments, counseling her about her currently uncontrolled complicated type 2 diabetes, hyperlipidemia, hypertension, chronic heavy smoking, and developing a plan to confirm the diagnosis and long term treatment as necessary.  Seraphina A Qian participated in the discussions, expressed understanding, and voiced agreement with the above plans.  All questions were answered to her  satisfaction. she is encouraged to contact clinic should she have any questions or concerns prior to her return visit.  Follow up plan: - Return in about 1 week (around 12/04/2017) for follow up with meter and logs- no labs.  Glade Lloyd, MD [facility] [address] [place], [place] [address] Phone: [phone]  Fax: [phone]    11/28/2017, 8:46 AM  This note was partially dictated with voice recognition software. Similar sounding words can be transcribed inadequately or may not  be corrected upon review.

## 2017-11-28 DIAGNOSIS — E782 Mixed hyperlipidemia: Secondary | ICD-10-CM | POA: Insufficient documentation

## 2017-11-28 DIAGNOSIS — N186 End stage renal disease: Secondary | ICD-10-CM | POA: Diagnosis not present

## 2017-11-28 DIAGNOSIS — Z992 Dependence on renal dialysis: Secondary | ICD-10-CM | POA: Diagnosis not present

## 2017-11-28 DIAGNOSIS — D509 Iron deficiency anemia, unspecified: Secondary | ICD-10-CM | POA: Diagnosis not present

## 2017-11-28 DIAGNOSIS — N250 Renal osteodystrophy: Secondary | ICD-10-CM | POA: Diagnosis not present

## 2017-11-28 DIAGNOSIS — D631 Anemia in chronic kidney disease: Secondary | ICD-10-CM | POA: Diagnosis not present

## 2017-11-30 DIAGNOSIS — D509 Iron deficiency anemia, unspecified: Secondary | ICD-10-CM | POA: Diagnosis not present

## 2017-11-30 DIAGNOSIS — N250 Renal osteodystrophy: Secondary | ICD-10-CM | POA: Diagnosis not present

## 2017-11-30 DIAGNOSIS — Z992 Dependence on renal dialysis: Secondary | ICD-10-CM | POA: Diagnosis not present

## 2017-11-30 DIAGNOSIS — N186 End stage renal disease: Secondary | ICD-10-CM | POA: Diagnosis not present

## 2017-11-30 DIAGNOSIS — D631 Anemia in chronic kidney disease: Secondary | ICD-10-CM | POA: Diagnosis not present

## 2017-12-02 ENCOUNTER — Other Ambulatory Visit: Payer: Self-pay | Admitting: Family Medicine

## 2017-12-02 DIAGNOSIS — H5700 Unspecified anomaly of pupillary function: Secondary | ICD-10-CM | POA: Diagnosis not present

## 2017-12-02 DIAGNOSIS — H26491 Other secondary cataract, right eye: Secondary | ICD-10-CM | POA: Diagnosis not present

## 2017-12-02 DIAGNOSIS — H5211 Myopia, right eye: Secondary | ICD-10-CM | POA: Diagnosis not present

## 2017-12-02 DIAGNOSIS — H26492 Other secondary cataract, left eye: Secondary | ICD-10-CM | POA: Diagnosis not present

## 2017-12-02 DIAGNOSIS — H4053X3 Glaucoma secondary to other eye disorders, bilateral, severe stage: Secondary | ICD-10-CM | POA: Diagnosis not present

## 2017-12-02 DIAGNOSIS — H52221 Regular astigmatism, right eye: Secondary | ICD-10-CM | POA: Diagnosis not present

## 2017-12-02 DIAGNOSIS — Z961 Presence of intraocular lens: Secondary | ICD-10-CM | POA: Diagnosis not present

## 2017-12-02 DIAGNOSIS — Z7984 Long term (current) use of oral hypoglycemic drugs: Secondary | ICD-10-CM | POA: Diagnosis not present

## 2017-12-02 DIAGNOSIS — H211X3 Other vascular disorders of iris and ciliary body, bilateral: Secondary | ICD-10-CM | POA: Diagnosis not present

## 2017-12-02 DIAGNOSIS — E113591 Type 2 diabetes mellitus with proliferative diabetic retinopathy without macular edema, right eye: Secondary | ICD-10-CM | POA: Diagnosis not present

## 2017-12-02 DIAGNOSIS — E113592 Type 2 diabetes mellitus with proliferative diabetic retinopathy without macular edema, left eye: Secondary | ICD-10-CM | POA: Diagnosis not present

## 2017-12-02 DIAGNOSIS — Z794 Long term (current) use of insulin: Secondary | ICD-10-CM | POA: Diagnosis not present

## 2017-12-03 DIAGNOSIS — N186 End stage renal disease: Secondary | ICD-10-CM | POA: Diagnosis not present

## 2017-12-03 DIAGNOSIS — D509 Iron deficiency anemia, unspecified: Secondary | ICD-10-CM | POA: Diagnosis not present

## 2017-12-03 DIAGNOSIS — N250 Renal osteodystrophy: Secondary | ICD-10-CM | POA: Diagnosis not present

## 2017-12-03 DIAGNOSIS — D631 Anemia in chronic kidney disease: Secondary | ICD-10-CM | POA: Diagnosis not present

## 2017-12-03 DIAGNOSIS — Z992 Dependence on renal dialysis: Secondary | ICD-10-CM | POA: Diagnosis not present

## 2017-12-04 ENCOUNTER — Encounter: Payer: Self-pay | Admitting: "Endocrinology

## 2017-12-04 ENCOUNTER — Ambulatory Visit (INDEPENDENT_AMBULATORY_CARE_PROVIDER_SITE_OTHER): Payer: Medicare Other | Admitting: "Endocrinology

## 2017-12-04 VITALS — BP 144/79 | HR 65 | Ht 63.0 in | Wt 146.0 lb

## 2017-12-04 DIAGNOSIS — N186 End stage renal disease: Secondary | ICD-10-CM | POA: Diagnosis not present

## 2017-12-04 DIAGNOSIS — I10 Essential (primary) hypertension: Secondary | ICD-10-CM

## 2017-12-04 DIAGNOSIS — E782 Mixed hyperlipidemia: Secondary | ICD-10-CM | POA: Diagnosis not present

## 2017-12-04 DIAGNOSIS — E1122 Type 2 diabetes mellitus with diabetic chronic kidney disease: Secondary | ICD-10-CM

## 2017-12-04 MED ORDER — INSULIN GLARGINE 100 UNIT/ML SOLOSTAR PEN: 18.0000 [IU] | PEN_INJECTOR | Freq: Every day | SUBCUTANEOUS | 2 refills | Status: DC

## 2017-12-04 NOTE — Progress Notes (Signed)
Endocrinology Consult Note       12/04/2017, 12:16 PM   Subjective:    Patient ID: Kara Knapp, female    DOB: 08/30/1970.  Kara Knapp is being seen in consultation for management of currently uncontrolled symptomatic diabetes requested by  Timmothy Euler, MD.   Past Medical History:  Diagnosis Date  . Anemia of chronic disease   . Asthma   . Blind left eye   . Bronchitis   . Cataract   . Cholecystitis, acute 05/26/2013   Status post cholecystectomy  . Chronic abdominal pain   . Chronic diarrhea   . Depression   . Diabetic foot ulcer ([place]) 03/01/2015  . Diastolic heart failure ([place])   . ESRD on hemodialysis ([place])    Started diaylsis 12/29/15  . Essential hypertension   . Fibroids   . Glaucoma   . History of blood transfusion   . History of pneumonia   . Hyperlipidemia   . Insulin-dependent diabetes mellitus with retinopathy ([place])   . Neuropathy   . Osteomyelitis ([place])    Toe on left foot   Past Surgical History:  Procedure Laterality Date  . A/V SHUNTOGRAM N/A 10/25/2016   Procedure: A/V Shuntogram - Right Arm;  Surgeon: Waynetta Sandy, MD;  Location: [facility];  Service: Cardiovascular;  Laterality: N/A;  . AV FISTULA PLACEMENT Right 10/17/2015   Procedure: INSERTION OF ARTERIOVENOUS GORE-TEX GRAFT RIGHT UPPER ARM WITH ACUSEAL;  Surgeon: Conrad [place], MD;  Location: [facility];  Service: Vascular;  Laterality: Right;  . CATARACT EXTRACTION W/ INTRAOCULAR LENS IMPLANT Bilateral   . CESAREAN SECTION    . CHOLECYSTECTOMY N/A 05/25/2013   Procedure: LAPAROSCOPIC CHOLECYSTECTOMY;  Surgeon: Jamesetta So, MD;  Location: [facility];  Service: General;  Laterality: N/A;  . COLONOSCOPY WITH PROPOFOL N/A 10/02/2016   Procedure: COLONOSCOPY WITH PROPOFOL;  Surgeon: Danie Binder, MD;  [facility];  Service: Endoscopy;  Laterality: N/A;  145 - pt knows to arrive at  11:15 per office  . ESOPHAGOGASTRODUODENOSCOPY (EGD) WITH PROPOFOL N/A 10/02/2016   Procedure: ESOPHAGOGASTRODUODENOSCOPY (EGD) WITH PROPOFOL;  Surgeon: Danie Binder, MD;  [facility];  Service: Endoscopy;  Laterality: N/A;  . EYE SURGERY Bilateral   . PARS PLANA VITRECTOMY Left 11/24/2014   Procedure: PARS PLANA VITRECTOMY WITH 25 GAUGE;  Surgeon: Hurman Horn, MD;  Location: [facility];  Service: Ophthalmology;  Laterality: Left;  . PERIPHERAL VASCULAR CATHETERIZATION N/A 04/28/2015   Procedure: Bilateral Upper Extremity Venography;  Surgeon: Conrad Sutter, MD;  Location: [facility];  Service: Cardiovascular;  Laterality: N/A;  . PHOTOCOAGULATION WITH LASER Left 11/24/2014   Procedure: PHOTOCOAGULATION WITH LASER;  Surgeon: Hurman Horn, MD;  Location: [facility];  Service: Ophthalmology;  Laterality: Left;  with insertion of silicone oil  . SAVORY DILATION N/A 10/02/2016   Procedure: SAVORY DILATION;  Surgeon: Danie Binder, MD;  [facility];  Service: Endoscopy;  Laterality: N/A;  . TUBAL LIGATION     Social History   Socioeconomic History  . Marital status: Single  Spouse name: Not on file  . Number of children: 5  . Years of education: GED  . Highest education level: Not on file  Occupational History  . Not on file  Social Needs  . Financial resource strain: Not on file  . Food insecurity:    Worry: Not on file    Inability: Not on file  . Transportation needs:    Medical: Not on file    Non-medical: Not on file  Tobacco Use  . Smoking status: Current Every Day Smoker    Packs/day: 1.50    Years: 23.00    Pack years: 34.50    Types: Cigarettes  . Smokeless tobacco: Never Used  . Tobacco comment: one pack daily  Substance and Sexual Activity  . Alcohol use: No    Alcohol/week: 0.0 oz    Frequency: Never  . Drug use: No    Comment: Sober for 8 years  . Sexual activity: Not Currently    Birth control/protection: Surgical  Lifestyle  . Physical  activity:    Days per week: Not on file    Minutes per session: Not on file  . Stress: Not on file  Relationships  . Social connections:    Talks on phone: Not on file    Gets together: Not on file    Attends religious service: Not on file    Active member of club or organization: Not on file    Attends meetings of clubs or organizations: Not on file    Relationship status: Not on file  Other Topics Concern  . Not on file  Social History Narrative  . Not on file   Outpatient Encounter Medications as of 12/04/2017  Medication Sig  . acetaminophen (TYLENOL) 325 MG tablet Take 650 mg by mouth every 6 (six) hours as needed for mild pain.  Marland Kitchen aspirin 81 MG tablet Take 81 mg by mouth daily.  . carvedilol (COREG) 12.5 MG tablet TAKE ONE TABLET BY MOUTH TWICE DAILY  . cholecalciferol (VITAMIN D) 1000 units tablet Take 1,000 Units by mouth daily.   . citalopram (CELEXA) 20 MG tablet TAKE ONE TABLET BY MOUTH DAILY.  Marland Kitchen Continuous Blood Gluc Sensor (FREESTYLE LIBRE 14 DAY SENSOR) MISC Inject 1 each into the skin every 14 (fourteen) days. Use as directed.  . cyclobenzaprine (FLEXERIL) 5 MG tablet Take 1 tablet (5 mg total) by mouth 3 (three) times daily as needed for muscle spasms.  Marland Kitchen dicyclomine (BENTYL) 10 MG capsule TAKE ONE CAPSULE BY MOUTH FOUR TIMES DAILY BEFORE MEALS AND AT BEDTIME AS NEEDED FOR DIARRHEA, HOLD FOR CONSTIPATION.  . fluticasone (FLONASE) 50 MCG/ACT nasal spray Place 2 sprays into both nostrils daily.  Marland Kitchen gabapentin (NEURONTIN) 100 MG capsule TAKE ONE CAPSULE BY MOUTH THREE TIMES DAILY  . glucose blood (PRODIGY NO CODING BLOOD GLUC) test strip USE TO CHECK BLOOD SUGAR TWICE DAILY.  . hydrALAZINE (APRESOLINE) 25 MG tablet TAKE ONE TABLET BY MOUTH THREE TIMES DAILY.  Marland Kitchen Insulin Glargine (LANTUS SOLOSTAR) 100 UNIT/ML Solostar Pen Inject 18 Units into the skin daily at 10 pm.  . Insulin Pen Needle (PEN NEEDLES) 31G X 5 MM MISC 1 Device by Does not apply route daily.  . nicotine  (NICODERM CQ - DOSED IN MG/24 HOURS) 14 mg/24hr patch Place 1 patch (14 mg total) onto the skin daily.  . nicotine (NICODERM CQ - DOSED IN MG/24 HOURS) 21 mg/24hr patch Place 1 patch (21 mg total) onto the skin daily.  . nicotine (NICODERM  CQ - DOSED IN MG/24 HR) 7 mg/24hr patch Place 1 patch (7 mg total) onto the skin daily.  Marland Kitchen ofloxacin (OCUFLOX) 0.3 % ophthalmic solution Place 1 drop into the right eye 4 (four) times daily as needed.  . Probiotic Product (RESTORA) CAPS Take 1 capsule by mouth daily.  Marland Kitchen PRODIGY TWIST TOP LANCETS 28G MISC USE TO CHECK BLOOD SUGAR UP TO FOUR TIMES DAILY.  Marland Kitchen PROTONIX 40 MG tablet TAKE 1 BY MOUTH DAILY  . sevelamer carbonate (RENVELA) 800 MG tablet Take 2,400 mg by mouth 3 (three) times daily with meals. And with snacks.  . traZODone (DESYREL) 50 MG tablet TAKE 1/2 TO 1 TABLET BY MOUTH AT BEDTIME AS NEEDED FOR SLEEP  . umeclidinium-vilanterol (ANORO ELLIPTA) 62.5-25 MCG/INH AEPB Inhale 1 puff into the lungs daily.  . [DISCONTINUED] Insulin Glargine (LANTUS SOLOSTAR) 100 UNIT/ML Solostar Pen Inject 15 Units into the skin daily at 10 pm.   No facility-administered encounter medications on file as of 12/04/2017.     ALLERGIES: Allergies  Allergen Reactions  . Bactrim [Sulfamethoxazole-Trimethoprim] Nausea And Vomiting  . Prednisone Other (See Comments)    "I was wide open and couldn't eat" per pt.     VACCINATION STATUS: Immunization History  Administered Date(s) Administered  . Influenza,inj,Quad PF,6+ Mos 05/22/2013, 05/11/2015  . Influenza-Unspecified 04/10/2016  . Pneumococcal Polysaccharide-23 05/22/2013  . Tdap 12/21/2014    Diabetes  She presents for her follow-up diabetic visit. She has type 2 diabetes mellitus. Onset time: She was diagnosed at approximately age of 9 years. Her disease course has been improving. There are no hypoglycemic associated symptoms. Pertinent negatives for hypoglycemia include no confusion, headaches, pallor or seizures.  Associated symptoms include fatigue, foot paresthesias and visual change. Pertinent negatives for diabetes include no chest pain, no polydipsia, no polyphagia and no polyuria. There are no hypoglycemic complications. Symptoms are worsening. Diabetic complications include nephropathy, peripheral neuropathy and retinopathy. (She has end-stage renal disease on hemodialysis since age of 14 years.  She has very little vision left on the right eye, legally blind on the left.  ) Risk factors for coronary artery disease include diabetes mellitus, hypertension, sedentary lifestyle and tobacco exposure. Current diabetic treatment includes insulin injections (She is currently on Lantus 12 units at bedtime and Humalog 4 units 3 times daily AC.  She does this insulin by counting the number of clicks on her pain due to visual impairment.). Compliance with diabetes treatment: Has documented history of noncompliance/nonadherence. Her weight is stable. She is following a generally unhealthy diet. When asked about meal planning, she reported none. She has not had a previous visit with a dietitian. She never participates in exercise. Her breakfast blood glucose range is generally 180-200 mg/dl. Her lunch blood glucose range is generally 180-200 mg/dl. Her dinner blood glucose range is generally 180-200 mg/dl. Her bedtime blood glucose range is generally 180-200 mg/dl. Her overall blood glucose range is 180-200 mg/dl. (She came with her meter showing average blood glucose of 185 over the last 1 days.  No reported or documented hypoglycemia.) An ACE inhibitor/angiotensin II receptor blocker is not being taken. She does not see a podiatrist.Eye exam is current.  Hypertension  This is a chronic problem. The current episode started more than 1 year ago. The problem is uncontrolled. Pertinent negatives include no chest pain, headaches, palpitations or shortness of breath. Risk factors for coronary artery disease include smoking/tobacco  exposure, sedentary lifestyle and diabetes mellitus. Past treatments include beta blockers. Hypertensive end-organ damage includes  kidney disease and retinopathy. Identifiable causes of hypertension include chronic renal disease.      Review of Systems  Constitutional: Positive for fatigue. Negative for chills, fever and unexpected weight change.  HENT: Negative for trouble swallowing and voice change.   Eyes: Negative for visual disturbance.  Respiratory: Negative for cough, shortness of breath and wheezing.   Cardiovascular: Negative for chest pain, palpitations and leg swelling.  Gastrointestinal: Negative for diarrhea, nausea and vomiting.  Endocrine: Negative for cold intolerance, heat intolerance, polydipsia, polyphagia and polyuria.  Musculoskeletal: Positive for gait problem. Negative for arthralgias and myalgias.       She uses a walker mainly due to her visual impairment.  Skin: Negative for color change, pallor, rash and wound.  Neurological: Negative for seizures and headaches.  Psychiatric/Behavioral: Negative for confusion and suicidal ideas.    Objective:    BP (!) 144/79   Pulse 65   Ht 5' 3"  (1.6 m)   Wt 146 lb (66.2 kg)   LMP 05/05/2015   BMI 25.86 kg/m   Wt Readings from Last 3 Encounters:  12/04/17 146 lb (66.2 kg)  11/27/17 146 lb (66.2 kg)  11/01/17 141 lb 6.4 oz (64.1 kg)     Physical Exam  Constitutional: She is oriented to person, place, and time. She appears well-developed.  HENT:  Head: Normocephalic and atraumatic.  Eyes: EOM are normal.  Neck: Normal range of motion. Neck supple. No tracheal deviation present. No thyromegaly present.  Cardiovascular: Normal rate.  Pulmonary/Chest: Effort normal.  Abdominal: There is no tenderness. There is no guarding.  Musculoskeletal: Normal range of motion. She exhibits no edema.  Neurological: She is alert and oriented to person, place, and time. She displays abnormal reflex. A cranial nerve deficit and  sensory deficit is present. Coordination normal.  She has very little vision on the right eye, legally blind on the left.  Skin: Skin is warm and dry. No rash noted. No erythema. No pallor.  Psychiatric: She has a normal mood and affect. Judgment normal.    CMP     Component Value Date/Time   NA 136 09/27/2017 1030   K 4.2 09/27/2017 1030   CL 95 (L) 09/27/2017 1030   CO2 24 09/27/2017 1030   GLUCOSE 260 (H) 09/27/2017 1030   GLUCOSE 472 (H) 09/23/2017 1119   BUN 24 09/27/2017 1030   CREATININE 3.05 (HH) 09/27/2017 1030   CREATININE 3.97 (H) 09/23/2017 1119   CALCIUM 9.0 09/27/2017 1030   PROT 6.1 09/23/2017 1119   PROT 6.0 05/13/2017 1437   ALBUMIN 3.2 (L) 09/07/2017 1028   ALBUMIN 3.5 05/13/2017 1437   AST 17 09/23/2017 1119   ALT 13 09/23/2017 1119   ALKPHOS 343 (H) 09/07/2017 1028   BILITOT 0.5 09/23/2017 1119   BILITOT 0.4 05/13/2017 1437   GFRNONAA 17 (L) 09/27/2017 1030   GFRAA 20 (L) 09/27/2017 1030     Diabetic Labs (most recent): Lab Results  Component Value Date   HGBA1C 9.6 09/27/2017   HGBA1C 7.4 09/06/2015   HGBA1C 6.5 06/10/2015     Lipid Panel ( most recent) Lipid Panel     Component Value Date/Time   CHOL 178 06/10/2015 1055   TRIG 94 06/10/2015 1055   HDL 46 06/10/2015 1055   CHOLHDL 3.9 06/10/2015 1055   LDLCALC 113 (H) 06/10/2015 1055      Lab Results  Component Value Date   TSH 4.668 (H) 01/21/2015  Assessment & Plan:   1. Type 2 diabetes mellitus with ESRD (end-stage renal disease) , and advanced retinopathy with legal blindness  - Kara Knapp has currently uncontrolled symptomatic type 2 DM since 47 years of age. -She returns with a glucose profile which is safer for her , averaging 185, with only basal insulin of her prandial insulin. -Her most recent A1c was high at 9.6%.  - Recent labs reviewed.  -her diabetes is complicated by end-stage renal disease on hemodialysis since age 44, legal blindness due to  advanced retinopathy, peripheral neuropathy and Kara Knapp remains at a high risk for more acute and chronic complications which include CAD, CVA, CKD, retinopathy, and neuropathy. These are all discussed in detail with the patient.  - I have counseled her on diet management by adopting a carbohydrate restricted/protein rich diet.  -  Suggestion is made for her to avoid simple carbohydrates  from her diet including Cakes, Sweet Desserts / Pastries, Ice Cream, Soda (diet and regular), Sweet Tea, Candies, Chips, Cookies, Store Bought Juices, Alcohol in Excess of  1-2 drinks a day, Artificial Sweeteners, and "Sugar-free" Products. This will help patient to have stable blood glucose profile and potentially avoid unintended weight gain.   - I encouraged her to switch to  unprocessed or minimally processed complex starch and increased protein intake (animal or plant source), fruits, and vegetables.  - she is advised to stick to a routine mealtimes to eat 3 meals  a day and avoid unnecessary snacks ( to snack only to correct hypoglycemia).   - she will be scheduled with Jearld Fenton, RDN, CDE for individualized diabetes education.  - I have approached her with the following individualized plan to manage diabetes and patient agrees:   -This patient is at extremely high risk for hypoglycemia due to visual impairment. -#1 goal in treating her diabetes would be to avoid hypoglycemia by continued simplified treatment program as much as possible.   -She has a few family members that she says she can ask for help specifically for dosing the insulin.   - I requested for her to increase her Lantus to 18 units nightly, associated with monitoring of blood glucose 2 times a day times daily before breakfast and at bedtime and as needed.  -If she requires multiple daily injections of insulin, she would benefit from premixed insulin such as Humalog 75/25 or NovoLog 30/86 for simplicity reasons. -I advised her  to to continue to hold Humalog for now.  -She is not a suitable candidate for metformin, SGLT2 inhibitors, nor incretin therapy.   -Patient is encouraged to call clinic for blood glucose levels less than 70 or above 300 mg /dl.  - Patient specific target  A1c;  LDL, HDL, Triglycerides, and  Waist Circumference were discussed in detail.  2) BP/HTN: Her blood pressure is not controlled to target.  She is currently on beta-blockers.  I advised her to discuss it during her nephrology evaluation during hemodialysis.    3) Lipids/HPL:   Her recent lipid panel show uncontrolled LDL of 113.   Patient is not on statins, will be considered for low-dose statins.  4)  Weight/Diet: She does not have weight to lose, however would benefit from diabetes education.  CDE Consult will be initiated , exercise, and detailed carbohydrates information provided.  5) Chronic Care/Health Maintenance:  -she  Is not on ACEI/ARB and Statin medications and  is encouraged to continue to follow up with Ophthalmology, Dentist, nephrology, podiatrist at  least yearly or according to recommendations, and advised to  quit smoking. I have recommended yearly flu vaccine and pneumonia vaccination at least every 5 years; moderate intensity exercise for up to 150 minutes weekly; and  sleep for at least 7 hours a day.  - I advised patient to maintain close follow up with Timmothy Euler, MD for primary care needs.  - Time spent with the patient: 25 min, of which >50% was spent in reviewing her blood glucose logs , discussing her hypo- and hyper-glycemic episodes, reviewing her current and  previous labs and insulin doses and developing a plan to avoid hypo- and hyper-glycemia. Please refer to Patient Instructions for Blood Glucose Monitoring and Insulin/Medications Dosing Guide"  in media tab for additional information. Kara Knapp participated in the discussions, expressed understanding, and voiced agreement with the above  plans.  All questions were answered to her satisfaction. she is encouraged to contact clinic should she have any questions or concerns prior to her return visit.  Follow up plan: - Return in about 2 months (around 02/12/2018) for follow up with pre-visit labs, meter, and logs.  Glade Lloyd, MD [facility] [address] [place],  [address] Phone: [phone]  Fax: [phone]    12/04/2017, 12:16 PM  This note was partially dictated with voice recognition software. Similar sounding words can be transcribed inadequately or may not  be corrected upon review.

## 2017-12-04 NOTE — Patient Instructions (Signed)

## 2017-12-05 DIAGNOSIS — D631 Anemia in chronic kidney disease: Secondary | ICD-10-CM | POA: Diagnosis not present

## 2017-12-05 DIAGNOSIS — Z992 Dependence on renal dialysis: Secondary | ICD-10-CM | POA: Diagnosis not present

## 2017-12-05 DIAGNOSIS — N186 End stage renal disease: Secondary | ICD-10-CM | POA: Diagnosis not present

## 2017-12-05 DIAGNOSIS — N250 Renal osteodystrophy: Secondary | ICD-10-CM | POA: Diagnosis not present

## 2017-12-05 DIAGNOSIS — D509 Iron deficiency anemia, unspecified: Secondary | ICD-10-CM | POA: Diagnosis not present

## 2017-12-07 DIAGNOSIS — D631 Anemia in chronic kidney disease: Secondary | ICD-10-CM | POA: Diagnosis not present

## 2017-12-07 DIAGNOSIS — N186 End stage renal disease: Secondary | ICD-10-CM | POA: Diagnosis not present

## 2017-12-07 DIAGNOSIS — N250 Renal osteodystrophy: Secondary | ICD-10-CM | POA: Diagnosis not present

## 2017-12-07 DIAGNOSIS — Z992 Dependence on renal dialysis: Secondary | ICD-10-CM | POA: Diagnosis not present

## 2017-12-07 DIAGNOSIS — D509 Iron deficiency anemia, unspecified: Secondary | ICD-10-CM | POA: Diagnosis not present

## 2017-12-10 DIAGNOSIS — D631 Anemia in chronic kidney disease: Secondary | ICD-10-CM | POA: Diagnosis not present

## 2017-12-10 DIAGNOSIS — D509 Iron deficiency anemia, unspecified: Secondary | ICD-10-CM | POA: Diagnosis not present

## 2017-12-10 DIAGNOSIS — N250 Renal osteodystrophy: Secondary | ICD-10-CM | POA: Diagnosis not present

## 2017-12-10 DIAGNOSIS — N186 End stage renal disease: Secondary | ICD-10-CM | POA: Diagnosis not present

## 2017-12-10 DIAGNOSIS — Z992 Dependence on renal dialysis: Secondary | ICD-10-CM | POA: Diagnosis not present

## 2017-12-12 DIAGNOSIS — N186 End stage renal disease: Secondary | ICD-10-CM | POA: Diagnosis not present

## 2017-12-12 DIAGNOSIS — H4053X3 Glaucoma secondary to other eye disorders, bilateral, severe stage: Secondary | ICD-10-CM | POA: Diagnosis not present

## 2017-12-12 DIAGNOSIS — N250 Renal osteodystrophy: Secondary | ICD-10-CM | POA: Diagnosis not present

## 2017-12-12 DIAGNOSIS — D631 Anemia in chronic kidney disease: Secondary | ICD-10-CM | POA: Diagnosis not present

## 2017-12-12 DIAGNOSIS — D509 Iron deficiency anemia, unspecified: Secondary | ICD-10-CM | POA: Diagnosis not present

## 2017-12-12 DIAGNOSIS — Z992 Dependence on renal dialysis: Secondary | ICD-10-CM | POA: Diagnosis not present

## 2017-12-14 DIAGNOSIS — D509 Iron deficiency anemia, unspecified: Secondary | ICD-10-CM | POA: Diagnosis not present

## 2017-12-14 DIAGNOSIS — N186 End stage renal disease: Secondary | ICD-10-CM | POA: Diagnosis not present

## 2017-12-14 DIAGNOSIS — Z992 Dependence on renal dialysis: Secondary | ICD-10-CM | POA: Diagnosis not present

## 2017-12-14 DIAGNOSIS — D631 Anemia in chronic kidney disease: Secondary | ICD-10-CM | POA: Diagnosis not present

## 2017-12-14 DIAGNOSIS — N250 Renal osteodystrophy: Secondary | ICD-10-CM | POA: Diagnosis not present

## 2017-12-17 ENCOUNTER — Ambulatory Visit: Payer: Self-pay | Admitting: Nurse Practitioner

## 2017-12-17 ENCOUNTER — Telehealth: Payer: Self-pay | Admitting: "Endocrinology

## 2017-12-17 DIAGNOSIS — N250 Renal osteodystrophy: Secondary | ICD-10-CM | POA: Diagnosis not present

## 2017-12-17 DIAGNOSIS — Z992 Dependence on renal dialysis: Secondary | ICD-10-CM | POA: Diagnosis not present

## 2017-12-17 DIAGNOSIS — D631 Anemia in chronic kidney disease: Secondary | ICD-10-CM | POA: Diagnosis not present

## 2017-12-17 DIAGNOSIS — D509 Iron deficiency anemia, unspecified: Secondary | ICD-10-CM | POA: Diagnosis not present

## 2017-12-17 DIAGNOSIS — N186 End stage renal disease: Secondary | ICD-10-CM | POA: Diagnosis not present

## 2017-12-17 NOTE — Telephone Encounter (Signed)
Called pt on cell phone. No answer

## 2017-12-17 NOTE — Telephone Encounter (Signed)
Patient is asking for a returned call from the nurse in regards to a glucose meter, please advise?

## 2017-12-18 ENCOUNTER — Encounter: Payer: Self-pay | Admitting: Nurse Practitioner

## 2017-12-18 ENCOUNTER — Ambulatory Visit (INDEPENDENT_AMBULATORY_CARE_PROVIDER_SITE_OTHER): Payer: Medicare Other | Admitting: Nurse Practitioner

## 2017-12-18 ENCOUNTER — Other Ambulatory Visit: Payer: Self-pay

## 2017-12-18 VITALS — BP 107/65 | HR 63 | Temp 97.0°F | Ht 63.0 in | Wt 149.0 lb

## 2017-12-18 DIAGNOSIS — R143 Flatulence: Secondary | ICD-10-CM | POA: Diagnosis not present

## 2017-12-18 DIAGNOSIS — R748 Abnormal levels of other serum enzymes: Secondary | ICD-10-CM

## 2017-12-18 DIAGNOSIS — K760 Fatty (change of) liver, not elsewhere classified: Secondary | ICD-10-CM | POA: Diagnosis not present

## 2017-12-18 DIAGNOSIS — K5900 Constipation, unspecified: Secondary | ICD-10-CM

## 2017-12-18 NOTE — Assessment & Plan Note (Signed)
History of fatty liver disease.  Elevated alk phos and elevated GGT.  Autoimmune serologies are pending and she states she will have these done she was unaware they were ordered.  Recent CT of the abdomen does not show CBD dilation.  No evidence of choledocholithiasis.  Pending her results, we may need eventually to recommend a liver biopsy.  Return for follow-up in 3 months.

## 2017-12-18 NOTE — Progress Notes (Addendum)
REVIEWED-NO ADDITIONAL RECOMMENDATIONS.  Referring Provider: Timmothy Euler, MD Primary Care Physician:  Timmothy Euler, MD Primary GI:  Dr. Oneida Alar  Chief Complaint  Patient presents with  . Follow-up    doing well except alot of gas    HPI:   Kara Knapp is a 47 y.o. female who presents for follow-up on abdominal pain and constipation.  Patient was last seen in our office 09/18/2017 for cirrhosis and constipation with lower abdominal pain.  History of dysphagia and EGD with dilation last completed March 2018, barium pill esophagram in April 2018 showed esophageal motility disorder.  History of fatty liver disease and hepatomegaly.  Previous attempted paracentesis with no fluid returned.  Query possible early cirrhosis but no stigmata of portal hypertension on EGD.  Platelet count normal.  Albumin drifting down, but chronic dialysis and poor intake.  Only way to discern definitive cirrhosis of the liver biopsy but this was deferred due to acute health issues currently ongoing.  Repeat CT of the abdomen and pelvis 09/07/2017 with upper limit normal liver size and minimal surface nodularity.  At her last visit she was having persistent abdominal pain and constipation.  Recent CT with no acute pathology.  Taking over-the-counter laxatives without help, bowel movement every 3+ days and has to "take it out."  Unsure of hematochezia or melena.  On hemodialysis 3 times a week: Tuesday, Thursday, Saturday.  No other hepatic or GI symptoms.  Amended labs, Linzess 72 mcg daily, follow-up in 3 months.  Labs completed 09/23/2017 including CBC, CMP, INR.  Hemoglobin low normal at 11.0 with known chronic renal disease.  Platelets normal.  CMP significantly high glucose of 472 and was recommended to follow-up with primary care.  Creatinine elevated as expected at 3.97.  Elevated alkaline phosphatase at 349.  Recommended GGT which is elevated at 89.  Recommended further work-up including autoimmune labs  have not been completed.  Today she states she's doing better. Linzess has helped her constipation. Has a bowel movement daily, typically soft and passes easily; rarely has to strain. Denies looking at her stool for hematochezia/melena. Denies N/V, fever, chills, unintentional weight loss. Wasn't aware of pending lab draw and states she will have those drawn. Denies chest pain, dyspnea, dizziness, lightheadedness, syncope, near syncope. Denies any other upper or lower GI symptoms.  Past Medical History:  Diagnosis Date  . Anemia of chronic disease   . Asthma   . Blind left eye   . Bronchitis   . Cataract   . Cholecystitis, acute 05/26/2013   Status post cholecystectomy  . Chronic abdominal pain   . Chronic diarrhea   . Depression   . Diabetic foot ulcer ([place]) 03/01/2015  . Diastolic heart failure ([place])   . ESRD on hemodialysis ([place])    Started diaylsis 12/29/15  . Essential hypertension   . Fibroids   . Glaucoma   . History of blood transfusion   . History of pneumonia   . Hyperlipidemia   . Insulin-dependent diabetes mellitus with retinopathy ([place])   . Neuropathy   . Osteomyelitis ([place])    Toe on left foot    Past Surgical History:  Procedure Laterality Date  . A/V SHUNTOGRAM N/A 10/25/2016   Procedure: A/V Shuntogram - Right Arm;  Surgeon: Waynetta Sandy, MD;  Location: [facility];  Service: Cardiovascular;  Laterality: N/A;  . AV FISTULA PLACEMENT Right 10/17/2015   Procedure: INSERTION OF ARTERIOVENOUS GORE-TEX GRAFT RIGHT UPPER ARM WITH ACUSEAL;  Surgeon:  Conrad Ithaca, MD;  Location: [facility];  Service: Vascular;  Laterality: Right;  . CATARACT EXTRACTION W/ INTRAOCULAR LENS IMPLANT Bilateral   . CESAREAN SECTION    . CHOLECYSTECTOMY N/A 05/25/2013   Procedure: LAPAROSCOPIC CHOLECYSTECTOMY;  Surgeon: Jamesetta So, MD;  Location: [facility];  Service: General;  Laterality: N/A;  . COLONOSCOPY WITH PROPOFOL N/A 10/02/2016   Procedure: COLONOSCOPY WITH PROPOFOL;   Surgeon: Danie Binder, MD;  [facility];  Service: Endoscopy;  Laterality: N/A;  145 - pt knows to arrive at 11:15 per office  . ESOPHAGOGASTRODUODENOSCOPY (EGD) WITH PROPOFOL N/A 10/02/2016   Procedure: ESOPHAGOGASTRODUODENOSCOPY (EGD) WITH PROPOFOL;  Surgeon: Danie Binder, MD;  [facility];  Service: Endoscopy;  Laterality: N/A;  . EYE SURGERY Bilateral   . PARS PLANA VITRECTOMY Left 11/24/2014   Procedure: PARS PLANA VITRECTOMY WITH 25 GAUGE;  Surgeon: Hurman Horn, MD;  Location: [facility];  Service: Ophthalmology;  Laterality: Left;  . PERIPHERAL VASCULAR CATHETERIZATION N/A 04/28/2015   Procedure: Bilateral Upper Extremity Venography;  Surgeon: Conrad Homer Glen, MD;  Location: [facility];  Service: Cardiovascular;  Laterality: N/A;  . PHOTOCOAGULATION WITH LASER Left 11/24/2014   Procedure: PHOTOCOAGULATION WITH LASER;  Surgeon: Hurman Horn, MD;  Location: [facility];  Service: Ophthalmology;  Laterality: Left;  with insertion of silicone oil  . SAVORY DILATION N/A 10/02/2016   Procedure: SAVORY DILATION;  Surgeon: Danie Binder, MD;  [facility];  Service: Endoscopy;  Laterality: N/A;  . TUBAL LIGATION      Current Outpatient Medications  Medication Sig Dispense Refill  . acetaminophen (TYLENOL) 325 MG tablet Take 650 mg by mouth every 6 (six) hours as needed for mild pain.    Marland Kitchen aspirin 81 MG tablet Take 81 mg by mouth daily.    . brimonidine-timolol (COMBIGAN) 0.2-0.5 % ophthalmic solution Place 1 drop into both eyes every 12 (twelve) hours.    . carvedilol (COREG) 12.5 MG tablet TAKE ONE TABLET BY MOUTH TWICE DAILY 60 tablet 2  . cholecalciferol (VITAMIN D) 1000 units tablet Take 1,000 Units by mouth daily.     . citalopram (CELEXA) 20 MG tablet TAKE ONE TABLET BY MOUTH DAILY. 30 tablet 2  . Continuous Blood Gluc Sensor (FREESTYLE LIBRE 14 DAY SENSOR) MISC Inject 1 each into the skin every 14 (fourteen) days. Use as directed. 2 each 2  .  cyclobenzaprine (FLEXERIL) 5 MG tablet Take 1 tablet (5 mg total) by mouth 3 (three) times daily as needed for muscle spasms. 30 tablet 0  . fluticasone (FLONASE) 50 MCG/ACT nasal spray Place 2 sprays into both nostrils daily. 16 g 6  . gabapentin (NEURONTIN) 100 MG capsule TAKE ONE CAPSULE BY MOUTH THREE TIMES DAILY 90 capsule 3  . glucose blood (PRODIGY NO CODING BLOOD GLUC) test strip USE TO CHECK BLOOD SUGAR TWICE DAILY. 200 each 3  . hydrALAZINE (APRESOLINE) 25 MG tablet TAKE ONE TABLET BY MOUTH THREE TIMES DAILY. (Patient taking differently: twice a day now) 90 tablet 0  . Insulin Glargine (LANTUS SOLOSTAR) 100 UNIT/ML Solostar Pen Inject 18 Units into the skin daily at 10 pm. 5 pen 2  . Insulin Pen Needle (PEN NEEDLES) 31G X 5 MM MISC 1 Device by Does not apply route daily. 100 each PRN  . PRODIGY TWIST TOP LANCETS 28G MISC USE TO CHECK BLOOD SUGAR UP TO FOUR TIMES DAILY. 100 each 1  . PROTONIX 40 MG tablet TAKE 1  BY MOUTH DAILY 90 tablet 3  . sevelamer carbonate (RENVELA) 800 MG tablet Take 2,400 mg by mouth 3 (three) times daily with meals. And with snacks.    . traZODone (DESYREL) 50 MG tablet TAKE 1/2 TO 1 TABLET BY MOUTH AT BEDTIME AS NEEDED FOR SLEEP 30 tablet 3  . umeclidinium-vilanterol (ANORO ELLIPTA) 62.5-25 MCG/INH AEPB Inhale 1 puff into the lungs daily.     No current facility-administered medications for this visit.     Allergies as of 12/18/2017 - Review Complete 12/18/2017  Allergen Reaction Noted  . Bactrim [sulfamethoxazole-trimethoprim] Nausea And Vomiting 05/21/2013  . Prednisone Other (See Comments) 07/09/2013    Family History  Problem Relation Age of Onset  . COPD Mother   . Cancer Father   . Lymphoma Father   . Diabetes Sister   . Deep vein thrombosis Sister   . Diabetes Brother   . Hyperlipidemia Brother   . Hypertension Brother   . Mental retardation Sister   . Alcohol abuse Paternal Grandmother   . Colon cancer Neg Hx   . Liver disease Neg Hx      Social History   Socioeconomic History  . Marital status: Single    Spouse name: Not on file  . Number of children: 5  . Years of education: GED  . Highest education level: Not on file  Occupational History  . Not on file  Social Needs  . Financial resource strain: Not on file  . Food insecurity:    Worry: Not on file    Inability: Not on file  . Transportation needs:    Medical: Not on file    Non-medical: Not on file  Tobacco Use  . Smoking status: Current Every Day Smoker    Packs/day: 1.50    Years: 23.00    Pack years: 34.50    Types: Cigarettes  . Smokeless tobacco: Never Used  . Tobacco comment: one pack daily  Substance and Sexual Activity  . Alcohol use: No    Alcohol/week: 0.0 oz    Frequency: Never  . Drug use: No    Comment: Sober for 8 years  . Sexual activity: Not Currently    Birth control/protection: Surgical  Lifestyle  . Physical activity:    Days per week: Not on file    Minutes per session: Not on file  . Stress: Not on file  Relationships  . Social connections:    Talks on phone: Not on file    Gets together: Not on file    Attends religious service: Not on file    Active member of club or organization: Not on file    Attends meetings of clubs or organizations: Not on file    Relationship status: Not on file  Other Topics Concern  . Not on file  Social History Narrative  . Not on file    Review of Systems: Complete ROS negative except as per HPI.  Physical Exam: BP 107/65   Pulse 63   Temp (!) 97 F (36.1 C) (Oral)   Ht 5' 3"  (1.6 m)   Wt 149 lb (67.6 kg)   LMP 05/05/2015   BMI 26.39 kg/m  General:   Alert and oriented. Pleasant and cooperative. Well-nourished and well-developed.  Eyes:  Without icterus, sclera clear and conjunctiva pink.  Ears:  Normal auditory acuity. Cardiovascular:  S1, S2 present without murmurs appreciated. Extremities without clubbing or edema. Respiratory:  Clear to auscultation bilaterally. No  wheezes, rales, or rhonchi. No  distress.  Gastrointestinal:  +BS, soft, non-tender and non-distended. No HSM noted. No guarding or rebound. No masses appreciated.  Rectal:  Deferred  Musculoskalatal:  Symmetrical without gross deformities. Neurologic:  Alert and oriented x4;  grossly normal neurologically. Psych:  Alert and cooperative. Normal mood and affect. Heme/Lymph/Immune: No excessive bruising noted.    12/18/2017 11:24 AM   Disclaimer: This note was dictated with voice recognition software. Similar sounding words can inadvertently be transcribed and may not be corrected upon review.

## 2017-12-18 NOTE — Progress Notes (Signed)
CC'D TO PCP °

## 2017-12-18 NOTE — Assessment & Plan Note (Signed)
The patient improved with intermittent Linzess.  She states that it causes some diarrhea so she only takes it when she needs to.  Overall she is satisfied with the result.  Recommend she continue her current medications.  Return for follow-up in 3 months.  She can start probiotics, which she normally takes but has not had in some time.  She states she will start taking them again.

## 2017-12-18 NOTE — Telephone Encounter (Signed)
Left message with family member for pt to call back

## 2017-12-18 NOTE — Assessment & Plan Note (Signed)
Since she has been having better bowel movements she notes some excessive flatulence.  She used to take probiotics but has not had them in several months.  I recommended she try probiotics again and see if this helps with her vague gas.  It is not painful and not overtly bothersome, just "embarrassing."  Follow-up in 3 months.

## 2017-12-18 NOTE — Patient Instructions (Signed)
1. As we discussed, have your labs drawn when you are able to. 2. Continue your current medications as you have been taking them. 3. You can restart probiotics to see if this helps with your gas. 4. Return for follow-up in 3 months. 5. Call us if you have any questions or concerns.  At [facility] we value your feedback. You may receive a survey about your visit today. Please share your experience as we strive to create trusting relationships with our patients to provide genuine, compassionate, quality care.  It was great to see you today!  I hope you have a wonderful summer!!

## 2017-12-19 DIAGNOSIS — D631 Anemia in chronic kidney disease: Secondary | ICD-10-CM | POA: Diagnosis not present

## 2017-12-19 DIAGNOSIS — N186 End stage renal disease: Secondary | ICD-10-CM | POA: Diagnosis not present

## 2017-12-19 DIAGNOSIS — N250 Renal osteodystrophy: Secondary | ICD-10-CM | POA: Diagnosis not present

## 2017-12-19 DIAGNOSIS — Z992 Dependence on renal dialysis: Secondary | ICD-10-CM | POA: Diagnosis not present

## 2017-12-19 DIAGNOSIS — D509 Iron deficiency anemia, unspecified: Secondary | ICD-10-CM | POA: Diagnosis not present

## 2017-12-20 DIAGNOSIS — H26492 Other secondary cataract, left eye: Secondary | ICD-10-CM | POA: Diagnosis not present

## 2017-12-20 DIAGNOSIS — H4051X3 Glaucoma secondary to other eye disorders, right eye, severe stage: Secondary | ICD-10-CM | POA: Diagnosis not present

## 2017-12-20 DIAGNOSIS — E113551 Type 2 diabetes mellitus with stable proliferative diabetic retinopathy, right eye: Secondary | ICD-10-CM | POA: Diagnosis not present

## 2017-12-20 DIAGNOSIS — H4053X3 Glaucoma secondary to other eye disorders, bilateral, severe stage: Secondary | ICD-10-CM | POA: Diagnosis not present

## 2017-12-21 DIAGNOSIS — N186 End stage renal disease: Secondary | ICD-10-CM | POA: Diagnosis not present

## 2017-12-21 DIAGNOSIS — D509 Iron deficiency anemia, unspecified: Secondary | ICD-10-CM | POA: Diagnosis not present

## 2017-12-21 DIAGNOSIS — Z992 Dependence on renal dialysis: Secondary | ICD-10-CM | POA: Diagnosis not present

## 2017-12-21 DIAGNOSIS — N250 Renal osteodystrophy: Secondary | ICD-10-CM | POA: Diagnosis not present

## 2017-12-21 DIAGNOSIS — D631 Anemia in chronic kidney disease: Secondary | ICD-10-CM | POA: Diagnosis not present

## 2017-12-24 DIAGNOSIS — N250 Renal osteodystrophy: Secondary | ICD-10-CM | POA: Diagnosis not present

## 2017-12-24 DIAGNOSIS — Z992 Dependence on renal dialysis: Secondary | ICD-10-CM | POA: Diagnosis not present

## 2017-12-24 DIAGNOSIS — D509 Iron deficiency anemia, unspecified: Secondary | ICD-10-CM | POA: Diagnosis not present

## 2017-12-24 DIAGNOSIS — N186 End stage renal disease: Secondary | ICD-10-CM | POA: Diagnosis not present

## 2017-12-24 DIAGNOSIS — D631 Anemia in chronic kidney disease: Secondary | ICD-10-CM | POA: Diagnosis not present

## 2017-12-25 DIAGNOSIS — R748 Abnormal levels of other serum enzymes: Secondary | ICD-10-CM | POA: Diagnosis not present

## 2017-12-26 DIAGNOSIS — N186 End stage renal disease: Secondary | ICD-10-CM | POA: Diagnosis not present

## 2017-12-26 DIAGNOSIS — D631 Anemia in chronic kidney disease: Secondary | ICD-10-CM | POA: Diagnosis not present

## 2017-12-26 DIAGNOSIS — N250 Renal osteodystrophy: Secondary | ICD-10-CM | POA: Diagnosis not present

## 2017-12-26 DIAGNOSIS — D509 Iron deficiency anemia, unspecified: Secondary | ICD-10-CM | POA: Diagnosis not present

## 2017-12-26 DIAGNOSIS — Z992 Dependence on renal dialysis: Secondary | ICD-10-CM | POA: Diagnosis not present

## 2017-12-27 DIAGNOSIS — N186 End stage renal disease: Secondary | ICD-10-CM | POA: Diagnosis not present

## 2017-12-27 DIAGNOSIS — D509 Iron deficiency anemia, unspecified: Secondary | ICD-10-CM | POA: Diagnosis not present

## 2017-12-27 DIAGNOSIS — N250 Renal osteodystrophy: Secondary | ICD-10-CM | POA: Diagnosis not present

## 2017-12-27 DIAGNOSIS — D631 Anemia in chronic kidney disease: Secondary | ICD-10-CM | POA: Diagnosis not present

## 2017-12-27 DIAGNOSIS — Z992 Dependence on renal dialysis: Secondary | ICD-10-CM | POA: Diagnosis not present

## 2017-12-29 LAB — MITOCHONDRIAL ANTIBODIES: Mitochondrial M2 Ab, IgG: <=20 U

## 2017-12-29 LAB — ANA: Anti Nuclear Antibody(ANA): NEGATIVE

## 2017-12-29 LAB — ANTI-SMOOTH MUSCLE ANTIBODY, IGG

## 2017-12-30 ENCOUNTER — Other Ambulatory Visit: Payer: Self-pay | Admitting: Family Medicine

## 2017-12-30 ENCOUNTER — Other Ambulatory Visit: Payer: Self-pay | Admitting: Nurse Practitioner

## 2017-12-30 DIAGNOSIS — R748 Abnormal levels of other serum enzymes: Secondary | ICD-10-CM

## 2017-12-30 DIAGNOSIS — Z992 Dependence on renal dialysis: Secondary | ICD-10-CM | POA: Diagnosis not present

## 2017-12-30 DIAGNOSIS — N186 End stage renal disease: Secondary | ICD-10-CM | POA: Diagnosis not present

## 2017-12-30 NOTE — Progress Notes (Signed)
See result note.  

## 2017-12-31 ENCOUNTER — Other Ambulatory Visit: Payer: Self-pay

## 2017-12-31 DIAGNOSIS — R748 Abnormal levels of other serum enzymes: Secondary | ICD-10-CM

## 2018-01-01 DIAGNOSIS — N186 End stage renal disease: Secondary | ICD-10-CM | POA: Diagnosis not present

## 2018-01-01 DIAGNOSIS — Z992 Dependence on renal dialysis: Secondary | ICD-10-CM | POA: Diagnosis not present

## 2018-01-03 ENCOUNTER — Ambulatory Visit: Payer: Medicare Other | Admitting: Family Medicine

## 2018-01-03 ENCOUNTER — Other Ambulatory Visit: Payer: Self-pay | Admitting: "Endocrinology

## 2018-01-03 DIAGNOSIS — Z992 Dependence on renal dialysis: Secondary | ICD-10-CM | POA: Diagnosis not present

## 2018-01-03 DIAGNOSIS — N186 End stage renal disease: Secondary | ICD-10-CM | POA: Diagnosis not present

## 2018-01-03 NOTE — Progress Notes (Signed)
PT called before I could get the letter done. She is aware of the labs to do and OK to schedule liver biopsy. However, she is out of state at this time visiting family and will not be back in [place] until sometime after the first of July unless Randall Hiss thinks that she should.  Randall Hiss, please advise!

## 2018-01-03 NOTE — Progress Notes (Signed)
Per pt's daughter in law, pt is still our of town. She will try to have her call us. I am mailing a letter reminder for her to call when she returns if she has not responded by then.

## 2018-01-08 DIAGNOSIS — Z992 Dependence on renal dialysis: Secondary | ICD-10-CM | POA: Diagnosis not present

## 2018-01-08 DIAGNOSIS — N186 End stage renal disease: Secondary | ICD-10-CM | POA: Diagnosis not present

## 2018-01-08 DIAGNOSIS — D509 Iron deficiency anemia, unspecified: Secondary | ICD-10-CM | POA: Diagnosis not present

## 2018-01-09 NOTE — Progress Notes (Signed)
PT is aware OK to wait til she returns to do the labs. She said she will call when she returns to schedule the liver biopsy. Sending FYI to [employer] .

## 2018-01-10 DIAGNOSIS — Z992 Dependence on renal dialysis: Secondary | ICD-10-CM | POA: Diagnosis not present

## 2018-01-10 DIAGNOSIS — N186 End stage renal disease: Secondary | ICD-10-CM | POA: Diagnosis not present

## 2018-01-10 DIAGNOSIS — D509 Iron deficiency anemia, unspecified: Secondary | ICD-10-CM | POA: Diagnosis not present

## 2018-01-13 DIAGNOSIS — N186 End stage renal disease: Secondary | ICD-10-CM | POA: Diagnosis not present

## 2018-01-13 DIAGNOSIS — Z992 Dependence on renal dialysis: Secondary | ICD-10-CM | POA: Diagnosis not present

## 2018-01-13 DIAGNOSIS — D509 Iron deficiency anemia, unspecified: Secondary | ICD-10-CM | POA: Diagnosis not present

## 2018-01-14 ENCOUNTER — Other Ambulatory Visit: Payer: Self-pay | Admitting: Family Medicine

## 2018-01-15 DIAGNOSIS — Z992 Dependence on renal dialysis: Secondary | ICD-10-CM | POA: Diagnosis not present

## 2018-01-15 DIAGNOSIS — D509 Iron deficiency anemia, unspecified: Secondary | ICD-10-CM | POA: Diagnosis not present

## 2018-01-15 DIAGNOSIS — N186 End stage renal disease: Secondary | ICD-10-CM | POA: Diagnosis not present

## 2018-01-17 DIAGNOSIS — N186 End stage renal disease: Secondary | ICD-10-CM | POA: Diagnosis not present

## 2018-01-17 DIAGNOSIS — Z992 Dependence on renal dialysis: Secondary | ICD-10-CM | POA: Diagnosis not present

## 2018-01-17 DIAGNOSIS — D509 Iron deficiency anemia, unspecified: Secondary | ICD-10-CM | POA: Diagnosis not present

## 2018-01-20 DIAGNOSIS — I259 Chronic ischemic heart disease, unspecified: Secondary | ICD-10-CM | POA: Diagnosis not present

## 2018-01-20 DIAGNOSIS — E119 Type 2 diabetes mellitus without complications: Secondary | ICD-10-CM | POA: Diagnosis not present

## 2018-01-20 DIAGNOSIS — N250 Renal osteodystrophy: Secondary | ICD-10-CM | POA: Diagnosis not present

## 2018-01-20 DIAGNOSIS — D631 Anemia in chronic kidney disease: Secondary | ICD-10-CM | POA: Diagnosis not present

## 2018-01-20 DIAGNOSIS — N186 End stage renal disease: Secondary | ICD-10-CM | POA: Diagnosis not present

## 2018-01-20 DIAGNOSIS — D509 Iron deficiency anemia, unspecified: Secondary | ICD-10-CM | POA: Diagnosis not present

## 2018-01-20 DIAGNOSIS — Z794 Long term (current) use of insulin: Secondary | ICD-10-CM | POA: Diagnosis not present

## 2018-01-20 DIAGNOSIS — Z992 Dependence on renal dialysis: Secondary | ICD-10-CM | POA: Diagnosis not present

## 2018-01-21 DIAGNOSIS — H211X1 Other vascular disorders of iris and ciliary body, right eye: Secondary | ICD-10-CM | POA: Diagnosis not present

## 2018-01-21 DIAGNOSIS — H4311 Vitreous hemorrhage, right eye: Secondary | ICD-10-CM | POA: Diagnosis not present

## 2018-01-21 DIAGNOSIS — E113592 Type 2 diabetes mellitus with proliferative diabetic retinopathy without macular edema, left eye: Secondary | ICD-10-CM | POA: Diagnosis not present

## 2018-01-21 DIAGNOSIS — E113551 Type 2 diabetes mellitus with stable proliferative diabetic retinopathy, right eye: Secondary | ICD-10-CM | POA: Diagnosis not present

## 2018-01-22 ENCOUNTER — Other Ambulatory Visit: Payer: Self-pay

## 2018-01-22 ENCOUNTER — Telehealth: Payer: Self-pay | Admitting: Gastroenterology

## 2018-01-22 DIAGNOSIS — R748 Abnormal levels of other serum enzymes: Secondary | ICD-10-CM

## 2018-01-22 NOTE — Telephone Encounter (Signed)
PT is aware her lab orders are to do at [facility] and she can go anytime. I will let Tretha Sciara know she is ready to schedule her liver biopsy. ( See notes under Mitochondrial Antibodies dated 12/25/2017).

## 2018-01-22 NOTE — Telephone Encounter (Signed)
Pt called to say that she needed to be set up for a liver biopsy and to have labs done. Please advise. [phone]

## 2018-01-22 NOTE — Telephone Encounter (Signed)
Liver biopsy ordered. Called and informed [employer] cabin crew.  Called and informed pt she will receive call from [employer] to schedule biopsy.

## 2018-01-23 DIAGNOSIS — Z992 Dependence on renal dialysis: Secondary | ICD-10-CM | POA: Diagnosis not present

## 2018-01-23 DIAGNOSIS — D509 Iron deficiency anemia, unspecified: Secondary | ICD-10-CM | POA: Diagnosis not present

## 2018-01-23 DIAGNOSIS — N250 Renal osteodystrophy: Secondary | ICD-10-CM | POA: Diagnosis not present

## 2018-01-23 DIAGNOSIS — N186 End stage renal disease: Secondary | ICD-10-CM | POA: Diagnosis not present

## 2018-01-23 DIAGNOSIS — D631 Anemia in chronic kidney disease: Secondary | ICD-10-CM | POA: Diagnosis not present

## 2018-01-24 ENCOUNTER — Telehealth: Payer: Self-pay | Admitting: Family Medicine

## 2018-01-24 NOTE — Telephone Encounter (Signed)
NA, NVM

## 2018-01-25 DIAGNOSIS — D509 Iron deficiency anemia, unspecified: Secondary | ICD-10-CM | POA: Diagnosis not present

## 2018-01-25 DIAGNOSIS — Z992 Dependence on renal dialysis: Secondary | ICD-10-CM | POA: Diagnosis not present

## 2018-01-25 DIAGNOSIS — N250 Renal osteodystrophy: Secondary | ICD-10-CM | POA: Diagnosis not present

## 2018-01-25 DIAGNOSIS — N186 End stage renal disease: Secondary | ICD-10-CM | POA: Diagnosis not present

## 2018-01-25 DIAGNOSIS — D631 Anemia in chronic kidney disease: Secondary | ICD-10-CM | POA: Diagnosis not present

## 2018-01-27 DIAGNOSIS — H4051X3 Glaucoma secondary to other eye disorders, right eye, severe stage: Secondary | ICD-10-CM | POA: Diagnosis not present

## 2018-01-27 DIAGNOSIS — E113511 Type 2 diabetes mellitus with proliferative diabetic retinopathy with macular edema, right eye: Secondary | ICD-10-CM | POA: Diagnosis not present

## 2018-01-27 DIAGNOSIS — H4311 Vitreous hemorrhage, right eye: Secondary | ICD-10-CM | POA: Diagnosis not present

## 2018-01-27 DIAGNOSIS — H211X1 Other vascular disorders of iris and ciliary body, right eye: Secondary | ICD-10-CM | POA: Diagnosis not present

## 2018-01-28 ENCOUNTER — Telehealth: Payer: Self-pay | Admitting: Gastroenterology

## 2018-01-28 DIAGNOSIS — D631 Anemia in chronic kidney disease: Secondary | ICD-10-CM | POA: Diagnosis not present

## 2018-01-28 DIAGNOSIS — N250 Renal osteodystrophy: Secondary | ICD-10-CM | POA: Diagnosis not present

## 2018-01-28 DIAGNOSIS — Z992 Dependence on renal dialysis: Secondary | ICD-10-CM | POA: Diagnosis not present

## 2018-01-28 DIAGNOSIS — K219 Gastro-esophageal reflux disease without esophagitis: Secondary | ICD-10-CM

## 2018-01-28 DIAGNOSIS — D509 Iron deficiency anemia, unspecified: Secondary | ICD-10-CM | POA: Diagnosis not present

## 2018-01-28 DIAGNOSIS — N186 End stage renal disease: Secondary | ICD-10-CM | POA: Diagnosis not present

## 2018-01-28 NOTE — Telephone Encounter (Signed)
[facility] uses [facility] in [place] and would like [place] to send in a prescription for protonix,  [phone]

## 2018-01-28 NOTE — Telephone Encounter (Signed)
Pt aware.

## 2018-01-28 NOTE — Telephone Encounter (Signed)
Forwarding to refill box.

## 2018-01-29 ENCOUNTER — Other Ambulatory Visit: Payer: Self-pay | Admitting: Radiology

## 2018-01-29 MED ORDER — PANTOPRAZOLE SODIUM 40 MG PO TBEC: DELAYED_RELEASE_TABLET | ORAL | 3 refills | Status: DC

## 2018-01-29 NOTE — Telephone Encounter (Signed)
Done

## 2018-01-29 NOTE — Addendum Note (Signed)
Addended by: Annitta Needs on: 01/29/2018 04:26 PM   Modules accepted: Orders

## 2018-01-30 DIAGNOSIS — D631 Anemia in chronic kidney disease: Secondary | ICD-10-CM | POA: Diagnosis not present

## 2018-01-30 DIAGNOSIS — D509 Iron deficiency anemia, unspecified: Secondary | ICD-10-CM | POA: Diagnosis not present

## 2018-01-30 DIAGNOSIS — N250 Renal osteodystrophy: Secondary | ICD-10-CM | POA: Diagnosis not present

## 2018-01-30 DIAGNOSIS — Z992 Dependence on renal dialysis: Secondary | ICD-10-CM | POA: Diagnosis not present

## 2018-01-30 DIAGNOSIS — N186 End stage renal disease: Secondary | ICD-10-CM | POA: Diagnosis not present

## 2018-01-31 ENCOUNTER — Ambulatory Visit (HOSPITAL_COMMUNITY): Admission: RE | Admit: 2018-01-31 | Payer: Medicare Other | Source: Ambulatory Visit

## 2018-01-31 ENCOUNTER — Telehealth: Payer: Self-pay | Admitting: Gastroenterology

## 2018-01-31 NOTE — Telephone Encounter (Signed)
FYI to Walden Field, NP, the ordering provider.

## 2018-01-31 NOTE — Telephone Encounter (Signed)
Spoke with patient. She wanted to know what the process for the liver biopsy was at [facility]. I advised she would need to call them as they know the steps in which they will do the biopsy. She will do so

## 2018-01-31 NOTE — Telephone Encounter (Signed)
Pt called to let us know that she rescheduled her US biopsy to next Friday. She has questions about that procedure that I couldn't answer and she asked if someone that could call her at [phone]

## 2018-02-01 DIAGNOSIS — N250 Renal osteodystrophy: Secondary | ICD-10-CM | POA: Diagnosis not present

## 2018-02-01 DIAGNOSIS — D631 Anemia in chronic kidney disease: Secondary | ICD-10-CM | POA: Diagnosis not present

## 2018-02-01 DIAGNOSIS — D509 Iron deficiency anemia, unspecified: Secondary | ICD-10-CM | POA: Diagnosis not present

## 2018-02-01 DIAGNOSIS — N186 End stage renal disease: Secondary | ICD-10-CM | POA: Diagnosis not present

## 2018-02-01 DIAGNOSIS — Z992 Dependence on renal dialysis: Secondary | ICD-10-CM | POA: Diagnosis not present

## 2018-02-03 DIAGNOSIS — R748 Abnormal levels of other serum enzymes: Secondary | ICD-10-CM | POA: Diagnosis not present

## 2018-02-04 DIAGNOSIS — N186 End stage renal disease: Secondary | ICD-10-CM | POA: Diagnosis not present

## 2018-02-04 DIAGNOSIS — N250 Renal osteodystrophy: Secondary | ICD-10-CM | POA: Diagnosis not present

## 2018-02-04 DIAGNOSIS — D631 Anemia in chronic kidney disease: Secondary | ICD-10-CM | POA: Diagnosis not present

## 2018-02-04 DIAGNOSIS — Z992 Dependence on renal dialysis: Secondary | ICD-10-CM | POA: Diagnosis not present

## 2018-02-04 DIAGNOSIS — D509 Iron deficiency anemia, unspecified: Secondary | ICD-10-CM | POA: Diagnosis not present

## 2018-02-04 LAB — FERRITIN: Ferritin: 1201 ng/mL — ABNORMAL HIGH (ref 16–232)

## 2018-02-04 LAB — IRON: Iron: 121 ug/dL (ref 40–190)

## 2018-02-04 LAB — CERULOPLASMIN: CERULOPLASMIN: 32 mg/dL (ref 18–53)

## 2018-02-06 ENCOUNTER — Other Ambulatory Visit: Payer: Self-pay | Admitting: Radiology

## 2018-02-06 DIAGNOSIS — D631 Anemia in chronic kidney disease: Secondary | ICD-10-CM | POA: Diagnosis not present

## 2018-02-06 DIAGNOSIS — D509 Iron deficiency anemia, unspecified: Secondary | ICD-10-CM | POA: Diagnosis not present

## 2018-02-06 DIAGNOSIS — N186 End stage renal disease: Secondary | ICD-10-CM | POA: Diagnosis not present

## 2018-02-06 DIAGNOSIS — Z992 Dependence on renal dialysis: Secondary | ICD-10-CM | POA: Diagnosis not present

## 2018-02-06 DIAGNOSIS — N250 Renal osteodystrophy: Secondary | ICD-10-CM | POA: Diagnosis not present

## 2018-02-07 ENCOUNTER — Ambulatory Visit (HOSPITAL_COMMUNITY)
Admission: RE | Admit: 2018-02-07 | Discharge: 2018-02-07 | Disposition: A | Discharge status: Home / Self Care | Payer: Medicare Other | Source: Ambulatory Visit | Attending: Nurse Practitioner | Admitting: Nurse Practitioner

## 2018-02-07 ENCOUNTER — Encounter (HOSPITAL_COMMUNITY): Payer: Self-pay

## 2018-02-07 DIAGNOSIS — I132 Hypertensive heart and chronic kidney disease with heart failure and with stage 5 chronic kidney disease, or end stage renal disease: Secondary | ICD-10-CM | POA: Insufficient documentation

## 2018-02-07 DIAGNOSIS — Z992 Dependence on renal dialysis: Secondary | ICD-10-CM | POA: Diagnosis not present

## 2018-02-07 DIAGNOSIS — E11319 Type 2 diabetes mellitus with unspecified diabetic retinopathy without macular edema: Secondary | ICD-10-CM | POA: Insufficient documentation

## 2018-02-07 DIAGNOSIS — Z8249 Family history of ischemic heart disease and other diseases of the circulatory system: Secondary | ICD-10-CM | POA: Insufficient documentation

## 2018-02-07 DIAGNOSIS — Z825 Family history of asthma and other chronic lower respiratory diseases: Secondary | ICD-10-CM | POA: Insufficient documentation

## 2018-02-07 DIAGNOSIS — E785 Hyperlipidemia, unspecified: Secondary | ICD-10-CM | POA: Diagnosis not present

## 2018-02-07 DIAGNOSIS — Z9049 Acquired absence of other specified parts of digestive tract: Secondary | ICD-10-CM | POA: Diagnosis not present

## 2018-02-07 DIAGNOSIS — N186 End stage renal disease: Secondary | ICD-10-CM | POA: Diagnosis not present

## 2018-02-07 DIAGNOSIS — Z888 Allergy status to other drugs, medicaments and biological substances status: Secondary | ICD-10-CM | POA: Diagnosis not present

## 2018-02-07 DIAGNOSIS — Z961 Presence of intraocular lens: Secondary | ICD-10-CM | POA: Insufficient documentation

## 2018-02-07 DIAGNOSIS — F329 Major depressive disorder, single episode, unspecified: Secondary | ICD-10-CM | POA: Insufficient documentation

## 2018-02-07 DIAGNOSIS — Z794 Long term (current) use of insulin: Secondary | ICD-10-CM | POA: Insufficient documentation

## 2018-02-07 DIAGNOSIS — R748 Abnormal levels of other serum enzymes: Secondary | ICD-10-CM | POA: Diagnosis not present

## 2018-02-07 DIAGNOSIS — Z8631 Personal history of diabetic foot ulcer: Secondary | ICD-10-CM | POA: Insufficient documentation

## 2018-02-07 DIAGNOSIS — I5032 Chronic diastolic (congestive) heart failure: Secondary | ICD-10-CM | POA: Diagnosis not present

## 2018-02-07 DIAGNOSIS — Z833 Family history of diabetes mellitus: Secondary | ICD-10-CM | POA: Insufficient documentation

## 2018-02-07 DIAGNOSIS — E1122 Type 2 diabetes mellitus with diabetic chronic kidney disease: Secondary | ICD-10-CM | POA: Diagnosis not present

## 2018-02-07 DIAGNOSIS — Z882 Allergy status to sulfonamides status: Secondary | ICD-10-CM | POA: Diagnosis not present

## 2018-02-07 DIAGNOSIS — R945 Abnormal results of liver function studies: Secondary | ICD-10-CM | POA: Diagnosis not present

## 2018-02-07 DIAGNOSIS — K760 Fatty (change of) liver, not elsewhere classified: Secondary | ICD-10-CM | POA: Diagnosis not present

## 2018-02-07 DIAGNOSIS — Z79899 Other long term (current) drug therapy: Secondary | ICD-10-CM | POA: Diagnosis not present

## 2018-02-07 DIAGNOSIS — Z7982 Long term (current) use of aspirin: Secondary | ICD-10-CM | POA: Insufficient documentation

## 2018-02-07 DIAGNOSIS — Z9841 Cataract extraction status, right eye: Secondary | ICD-10-CM | POA: Insufficient documentation

## 2018-02-07 DIAGNOSIS — H5462 Unqualified visual loss, left eye, normal vision right eye: Secondary | ICD-10-CM | POA: Diagnosis not present

## 2018-02-07 DIAGNOSIS — F17210 Nicotine dependence, cigarettes, uncomplicated: Secondary | ICD-10-CM | POA: Diagnosis not present

## 2018-02-07 DIAGNOSIS — Z9842 Cataract extraction status, left eye: Secondary | ICD-10-CM | POA: Insufficient documentation

## 2018-02-07 DIAGNOSIS — E1140 Type 2 diabetes mellitus with diabetic neuropathy, unspecified: Secondary | ICD-10-CM | POA: Diagnosis not present

## 2018-02-07 DIAGNOSIS — K7689 Other specified diseases of liver: Secondary | ICD-10-CM | POA: Diagnosis not present

## 2018-02-07 DIAGNOSIS — J45909 Unspecified asthma, uncomplicated: Secondary | ICD-10-CM | POA: Diagnosis not present

## 2018-02-07 DIAGNOSIS — H409 Unspecified glaucoma: Secondary | ICD-10-CM | POA: Insufficient documentation

## 2018-02-07 DIAGNOSIS — Z810 Family history of intellectual disabilities: Secondary | ICD-10-CM | POA: Insufficient documentation

## 2018-02-07 DIAGNOSIS — Z807 Family history of other malignant neoplasms of lymphoid, hematopoietic and related tissues: Secondary | ICD-10-CM | POA: Insufficient documentation

## 2018-02-07 DIAGNOSIS — K740 Hepatic fibrosis: Secondary | ICD-10-CM | POA: Diagnosis not present

## 2018-02-07 LAB — CBC
HEMATOCRIT: 35.3 % — AB (ref 36.0–46.0)
Hemoglobin: 11.7 g/dL — ABNORMAL LOW (ref 12.0–15.0)
MCH: 33.3 pg (ref 26.0–34.0)
MCHC: 33.1 g/dL (ref 30.0–36.0)
MCV: 100.6 fL — AB (ref 78.0–100.0)
Platelets: 191 10*3/uL (ref 150–400)
RBC: 3.51 MIL/uL — ABNORMAL LOW (ref 3.87–5.11)
RDW: 13.2 % (ref 11.5–15.5)
WBC: 7.7 10*3/uL (ref 4.0–10.5)

## 2018-02-07 LAB — PROTIME-INR
INR: 0.99
Prothrombin Time: 13 seconds (ref 11.4–15.2)

## 2018-02-07 LAB — GLUCOSE, CAPILLARY: GLUCOSE-CAPILLARY: 133 mg/dL — AB (ref 70–99)

## 2018-02-07 MED ORDER — MIDAZOLAM HCL 2 MG/2ML IJ SOLN
INTRAMUSCULAR | Status: AC
  Filled 2018-02-07: qty 2

## 2018-02-07 MED ORDER — SODIUM CHLORIDE 0.9 % IV SOLN
INTRAVENOUS | Status: AC | PRN
  Administered 2018-02-07: 10 mL/h via INTRAVENOUS

## 2018-02-07 MED ORDER — FENTANYL CITRATE (PF) 100 MCG/2ML IJ SOLN
INTRAMUSCULAR | Status: AC | PRN
  Administered 2018-02-07: 50 ug via INTRAVENOUS

## 2018-02-07 MED ORDER — LIDOCAINE HCL (PF) 1 % IJ SOLN
INTRAMUSCULAR | Status: AC
  Filled 2018-02-07: qty 30

## 2018-02-07 MED ORDER — GELATIN ABSORBABLE 12-7 MM EX MISC
CUTANEOUS | Status: AC
  Filled 2018-02-07: qty 1

## 2018-02-07 MED ORDER — SODIUM CHLORIDE 0.9 % IV SOLN: INTRAVENOUS | Status: DC

## 2018-02-07 MED ORDER — MIDAZOLAM HCL 2 MG/2ML IJ SOLN
INTRAMUSCULAR | Status: AC | PRN
  Administered 2018-02-07: 1 mg via INTRAVENOUS

## 2018-02-07 MED ORDER — FENTANYL CITRATE (PF) 100 MCG/2ML IJ SOLN
INTRAMUSCULAR | Status: AC
  Filled 2018-02-07: qty 2

## 2018-02-07 NOTE — Sedation Documentation (Signed)
Patient denies pain and is resting comfortably.  

## 2018-02-07 NOTE — Telephone Encounter (Signed)
Noted  

## 2018-02-07 NOTE — Discharge Instructions (Addendum)
Liver Biopsy, Care After Refer to this sheet in the next few weeks. These instructions provide you with information on caring for yourself after your procedure. Your health care provider may also give you more specific instructions. Your treatment has been planned according to current medical practices, but problems sometimes occur. Call your health care provider if you have any problems or questions after your procedure. What can I expect after the procedure? After your procedure, it is typical to have the following:  A small amount of discomfort in the area where the biopsy was done and in the right shoulder or shoulder blade.  A small amount of bruising around the area where the biopsy was done and on the skin over the liver.  Sleepiness and fatigue for the rest of the day.  Follow these instructions at home:  Rest at home for 1-2 days or as directed by your health care provider.  Have a friend or family member stay with you for at least 24 hours.  Because of the medicines used during the procedure, you should not do the following things in the first 24 hours: ? Drive. ? Use machinery. ? Be responsible for the care of other people. ? Sign legal documents. ? Take a bath or shower.  There are many different ways to close and cover an incision, including stitches, skin glue, and adhesive strips. Follow your health care provider's instructions on: ? Incision care. ? Bandage (dressing) changes and removal. ? Incision closure removal.  Do not drink alcohol in the first week.  Do not lift more than 5 pounds or play contact sports for 2 weeks after this test.  Take medicines only as directed by your health care provider. Do not take medicine containing aspirin or non-steroidal anti-inflammatory medicines such as ibuprofen for 1 week after this test.  It is your responsibility to get your test results. Contact a health care provider if:  You have increased bleeding from an incision  that results in more than a small spot of blood.  You have redness, swelling, or increasing pain in any incisions.  You notice a discharge or a bad smell coming from any of your incisions.  You have a fever or chills. Get help right away if:  You develop swelling, bloating, or pain in your abdomen.  You become dizzy or faint.  You develop a rash.  You are nauseous or vomit.  You have difficulty breathing, feel short of breath, or feel faint.  You develop chest pain.  You have problems with your speech or vision.  You have trouble balancing or moving your arms or legs. This information is not intended to replace advice given to you by your health care provider. Make sure you discuss any questions you have with your health care provider. Document Released: 02/02/2005 Document Revised: 12/22/2015 Document Reviewed: 09/11/2013 Elsevier Interactive Patient Education  2018 [employer]. Moderate Conscious Sedation, Adult, Care After These instructions provide you with information about caring for yourself after your procedure. Your health care provider may also give you more specific instructions. Your treatment has been planned according to current medical practices, but problems sometimes occur. Call your health care provider if you have any problems or questions after your procedure. What can I expect after the procedure? After your procedure, it is common:  To feel sleepy for several hours.  To feel clumsy and have poor balance for several hours.  To have poor judgment for several hours.  To vomit if you eat  too soon.  Follow these instructions at home: For at least 24 hours after the procedure:   Do not: ? Participate in activities where you could fall or become injured. ? Drive. ? Use heavy machinery. ? Drink alcohol. ? Take sleeping pills or medicines that cause drowsiness. ? Make important decisions or sign legal documents. ? Take care of children on your  own.  Rest. Eating and drinking  Follow the diet recommended by your health care provider.  If you vomit: ? Drink water, juice, or soup when you can drink without vomiting. ? Make sure you have little or no nausea before eating solid foods. General instructions  Have a responsible adult stay with you until you are awake and alert.  Take over-the-counter and prescription medicines only as told by your health care provider.  If you smoke, do not smoke without supervision.  Keep all follow-up visits as told by your health care provider. This is important. Contact a health care provider if:  You keep feeling nauseous or you keep vomiting.  You feel light-headed.  You develop a rash.  You have a fever. Get help right away if:  You have trouble breathing. This information is not intended to replace advice given to you by your health care provider. Make sure you discuss any questions you have with your health care provider. Document Released: 05/06/2013 Document Revised: 12/19/2015 Document Reviewed: 11/05/2015 Elsevier Interactive Patient Education  [employer].

## 2018-02-07 NOTE — H&P (Addendum)
Chief Complaint: Elevated liver function tests  Referring Physician(s): Anderson A  Supervising Physician: Aletta Edouard  Patient Status: [facility] - Out-pt  History of Present Illness: Kara Knapp is a 47 y.o. female with history of end stage renal failure on hemodialysis, fatty liver disease, elevated alk phos, elevated GGT, and possible cirrhosis.  She was initially seen by GI for persistent abdominal pain and constipation.  She is here today for random liver biopsy.  She is NPO. No blood thinners.  Past Medical History:  Diagnosis Date  . Anemia of chronic disease   . Asthma   . Blind left eye   . Bronchitis   . Cataract   . Cholecystitis, acute 05/26/2013   Status post cholecystectomy  . Chronic abdominal pain   . Chronic diarrhea   . Depression   . Diabetic foot ulcer ([place]) 03/01/2015  . Diastolic heart failure ([place])   . ESRD on hemodialysis ([place])    Started diaylsis 12/29/15  . Essential hypertension   . Fibroids   . Glaucoma   . History of blood transfusion   . History of pneumonia   . Hyperlipidemia   . Insulin-dependent diabetes mellitus with retinopathy ([place])   . Neuropathy   . Osteomyelitis ([place])    Toe on left foot    Past Surgical History:  Procedure Laterality Date  . A/V SHUNTOGRAM N/A 10/25/2016   Procedure: A/V Shuntogram - Right Arm;  Surgeon: Waynetta Sandy, MD;  Location: [place] [facility];  Service: Cardiovascular;  Laterality: N/A;  . AV FISTULA PLACEMENT Right 10/17/2015   Procedure: INSERTION OF ARTERIOVENOUS GORE-TEX GRAFT RIGHT UPPER ARM WITH ACUSEAL;  Surgeon: Conrad Harrisville, MD;  Location: [place];  Service: Vascular;  Laterality: Right;  . CATARACT EXTRACTION W/ INTRAOCULAR LENS IMPLANT Bilateral   . CESAREAN SECTION    . CHOLECYSTECTOMY N/A 05/25/2013   Procedure: LAPAROSCOPIC CHOLECYSTECTOMY;  Surgeon: Jamesetta So, MD;  Location: [facility];  Service: General;  Laterality: N/A;  . COLONOSCOPY WITH PROPOFOL N/A  10/02/2016   Procedure: COLONOSCOPY WITH PROPOFOL;  Surgeon: Danie Binder, MD;  Location[facility];  Service: Endoscopy;  Laterality: N/A;  145 - pt knows to arrive at 11:15 per office  . ESOPHAGOGASTRODUODENOSCOPY (EGD) WITH PROPOFOL N/A 10/02/2016   Procedure: ESOPHAGOGASTRODUODENOSCOPY (EGD) WITH PROPOFOL;  Surgeon: Danie Binder, MD;  [facility];  Service: Endoscopy;  Laterality: N/A;  . EYE SURGERY Bilateral   . PARS PLANA VITRECTOMY Left 11/24/2014   Procedure: PARS PLANA VITRECTOMY WITH 25 GAUGE;  Surgeon: Hurman Horn, MD;  Location: [place];  Service: Ophthalmology;  Laterality: Left;  . PERIPHERAL VASCULAR CATHETERIZATION N/A 04/28/2015   Procedure: Bilateral Upper Extremity Venography;  Surgeon: Conrad Lincoln, MD;  Location: [place] [facility];  Service: Cardiovascular;  Laterality: N/A;  . PHOTOCOAGULATION WITH LASER Left 11/24/2014   Procedure: PHOTOCOAGULATION WITH LASER;  Surgeon: Hurman Horn, MD;  Location: [place];  Service: Ophthalmology;  Laterality: Left;  with insertion of silicone oil  . SAVORY DILATION N/A 10/02/2016   Procedure: SAVORY DILATION;  Surgeon: Danie Binder, MD;  [facility];  Service: Endoscopy;  Laterality: N/A;  . TUBAL LIGATION      Allergies: Bactrim [sulfamethoxazole-trimethoprim] and Prednisone  Medications: Prior to Admission medications   Medication Sig Start Date End Date Taking? Authorizing Provider  acetaminophen (TYLENOL) 500 MG tablet Take 1,000 mg by mouth every 6 (six) hours as needed for  mild pain or moderate pain.     [provider]  aspirin 81 MG tablet Take 81 mg by mouth daily.    [provider]  brimonidine-timolol (COMBIGAN) 0.2-0.5 % ophthalmic solution Place 1 drop into both eyes every 12 (twelve) hours.    [provider]  carvedilol (COREG) 12.5 MG tablet TAKE ONE TABLET BY MOUTH TWICE DAILY 10/15/17   Timmothy Euler, MD  cholecalciferol (VITAMIN D) 1000 units tablet Take  1,000 Units by mouth daily.     [provider]  citalopram (CELEXA) 20 MG tablet TAKE ONE TABLET BY MOUTH DAILY. 01/14/18   Timmothy Euler, MD  Continuous Blood Gluc Sensor (FREESTYLE LIBRE 14 DAY SENSOR) MISC Inject 1 each into the skin every 14 (fourteen) days. Use as directed. 11/27/17   Cassandria Anger, MD  cyclobenzaprine (FLEXERIL) 5 MG tablet Take 1 tablet (5 mg total) by mouth 3 (three) times daily as needed for muscle spasms. Patient not taking: Reported on 01/24/2018 11/01/17   Timmothy Euler, MD  fluticasone [facility]) 50 MCG/ACT nasal spray Place 2 sprays into both nostrils daily. Patient taking differently: Place 2 sprays into both nostrils daily as needed for allergies.  06/24/17   Timmothy Euler, MD  gabapentin (NEURONTIN) 100 MG capsule TAKE ONE CAPSULE BY MOUTH THREE TIMES DAILY 12/31/17   Timmothy Euler, MD  glucose blood (PRODIGY NO CODING BLOOD GLUC) test strip USE TO CHECK BLOOD SUGAR TWICE DAILY. 10/28/17   Timmothy Euler, MD  hydrALAZINE (APRESOLINE) 25 MG tablet TAKE ONE TABLET BY MOUTH THREE TIMES DAILY. Patient taking differently: TAKE 25MG BY MOUTH TWICE DAILY 12/02/17   Timmothy Euler, MD  Insulin Glargine (LANTUS SOLOSTAR) 100 UNIT/ML Solostar Pen Inject 18 Units into the skin daily at 10 pm. Patient not taking: Reported on 01/24/2018 12/04/17   Cassandria Anger, MD  Insulin Glargine (LANTUS SOLOSTAR) 100 UNIT/ML Solostar Pen Inject 18 Units into the skin at bedtime. 01/06/18   Cassandria Anger, MD  Insulin Pen Needle (PEN NEEDLES) 31G X 5 MM MISC 1 Device by Does not apply route daily. 05/13/17   Timmothy Euler, MD  pantoprazole (PROTONIX) 40 MG tablet TAKE 1 BY MOUTH DAILY 30 minutes before breakfast 01/29/18   Annitta Needs, NP  PRODIGY TWIST TOP LANCETS 28G MISC USE TO CHECK BLOOD SUGAR UP TO FOUR TIMES DAILY. 03/13/17   Timmothy Euler, MD  sevelamer carbonate (RENVELA) 800 MG tablet Take 2,400 mg by mouth 3 (three) times daily  with meals. And with snacks.    [provider]  traZODone (DESYREL) 50 MG tablet TAKE 1/2 TO 1 TABLET BY MOUTH AT BEDTIME AS NEEDED FOR SLEEP Patient not taking: Reported on 01/24/2018 11/19/17   Timmothy Euler, MD     Family History  Problem Relation Age of Onset  . COPD Mother   . Cancer Father   . Lymphoma Father   . Diabetes Sister   . Deep vein thrombosis Sister   . Diabetes Brother   . Hyperlipidemia Brother   . Hypertension Brother   . Mental retardation Sister   . Alcohol abuse Paternal Grandmother   . Colon cancer Neg Hx   . Liver disease Neg Hx     Social History   Socioeconomic History  . Marital status: Single    Spouse name: Not on file  . Number of children: 5  . Years of education: GED  . Highest education level: Not on  file  Occupational History  . Not on file  Social Needs  . Financial resource strain: Not on file  . Food insecurity:    Worry: Not on file    Inability: Not on file  . Transportation needs:    Medical: Not on file    Non-medical: Not on file  Tobacco Use  . Smoking status: Current Every Day Smoker    Packs/day: 1.50    Years: 23.00    Pack years: 34.50    Types: Cigarettes  . Smokeless tobacco: Never Used  . Tobacco comment: one pack daily  Substance and Sexual Activity  . Alcohol use: No    Alcohol/week: 0.0 oz    Frequency: Never  . Drug use: No    Comment: Sober for 8 years  . Sexual activity: Not Currently    Birth control/protection: Surgical  Lifestyle  . Physical activity:    Days per week: Not on file    Minutes per session: Not on file  . Stress: Not on file  Relationships  . Social connections:    Talks on phone: Not on file    Gets together: Not on file    Attends religious service: Not on file    Active member of club or organization: Not on file    Attends meetings of clubs or organizations: Not on file    Relationship status: Not on file  Other Topics Concern  . Not on file  Social History  Narrative  . Not on file     Review of Systems: A 12 point ROS discussed and pertinent positives are indicated in the HPI above.  All other systems are negative.  Review of Systems  Vital Signs: BP (!) 169/78   Pulse 65   Temp 98.4 F (36.9 C) (Oral)   Ht _0  (1.6 m)   Wt 147 lb (66.7 kg)   LMP 05/05/2015   SpO2 100%   BMI 26.04 kg/m   Physical Exam  Constitutional: She is oriented to person, place, and time. She appears well-developed.  HENT:  Head: Normocephalic and atraumatic.  Eyes: EOM are normal.  Neck: Normal range of motion.  Cardiovascular: Normal rate, regular rhythm and normal heart sounds.  Pulmonary/Chest: Effort normal and breath sounds normal.  Abdominal: Soft. She exhibits no distension.  Musculoskeletal: Normal range of motion.       Arms: Right upper arm AV graft  Neurological: She is alert and oriented to person, place, and time.  Skin: Skin is warm and dry.  Psychiatric: She has a normal mood and affect. Her behavior is normal. Judgment and thought content normal.  Vitals reviewed.   Imaging: No results found.  Labs:  CBC: Recent Labs    05/13/17 1437 05/23/17 1145 09/07/17 1028 09/23/17 1119  WBC 12.3* 7.3 7.3 5.9  HGB 11.7 10.7* 11.6* 11.0*  HCT 36.6 32.5* 36.1 33.5*  PLT 227 171 170 195    COAGS: Recent Labs    09/23/17 1119  INR 1.0    BMP: Recent Labs    05/13/17 1437 09/07/17 1028 09/23/17 1119 09/27/17 1030  NA 128* 134* 129* 136  K 4.4 4.4 4.9 4.2  CL 90* 98* 92* 95*  CO2 _1 GLUCOSE 500* 297* 472* 260*  BUN 36* 64* 42* 24  CALCIUM 8.8 8.8* 9.1 9.0  CREATININE 3.81* 5.50* 3.97* 3.05*  GFRNONAA 13* 8*  --  17*  GFRAA 15* 10*  --  20*    LIVER  FUNCTION TESTS: Recent Labs    05/13/17 1437 09/07/17 1028 09/23/17 1119  BILITOT 0.4 0.8 0.5  AST _0 ALT _1 ALKPHOS 236* 343*  --   PROT 6.0 6.5 6.1  ALBUMIN 3.5 3.2*  --     TUMOR MARKERS: No results for input(s): AFPTM, CEA,  CA199, CHROMGRNA in the last 8760 hours.  Assessment and Plan:  Fatty liver, elevated liver enzymes, possible cirrhosis.  Will proceed with image guided random liver biopsy today.  Risks and benefits discussed with the patient including, but not limited to bleeding, infection, damage to adjacent structures or low yield requiring additional tests.  All of the patient's questions were answered, patient is agreeable to proceed. Consent signed and in chart.  Thank you for this interesting consult.  I greatly enjoyed meeting Kara Knapp and look forward to participating in their care.  A copy of this report was sent to the requesting provider on this date.  Electronically Signed: Murrell Redden, PA-C   02/07/2018, 11:16 AM      I spent a total of  30 Minutes in face to face in clinical consultation, greater than 50% of which was counseling/coordinating care for liver biopsy.

## 2018-02-07 NOTE — Procedures (Signed)
Interventional Radiology Procedure Note  Procedure: US guided liver biopsy, medical liver.  Complications: None Recommendations:  - Ok to shower tomorrow - Do not submerge for 7 days - Routine care  - 2 hr dc home  Signed,  Dulcy Fanny. Earleen Newport, DO

## 2018-02-07 NOTE — Sedation Documentation (Signed)
Patient is resting comfortably. 

## 2018-02-08 DIAGNOSIS — N186 End stage renal disease: Secondary | ICD-10-CM | POA: Diagnosis not present

## 2018-02-08 DIAGNOSIS — D509 Iron deficiency anemia, unspecified: Secondary | ICD-10-CM | POA: Diagnosis not present

## 2018-02-08 DIAGNOSIS — Z992 Dependence on renal dialysis: Secondary | ICD-10-CM | POA: Diagnosis not present

## 2018-02-08 DIAGNOSIS — N250 Renal osteodystrophy: Secondary | ICD-10-CM | POA: Diagnosis not present

## 2018-02-08 DIAGNOSIS — D631 Anemia in chronic kidney disease: Secondary | ICD-10-CM | POA: Diagnosis not present

## 2018-02-09 DIAGNOSIS — I120 Hypertensive chronic kidney disease with stage 5 chronic kidney disease or end stage renal disease: Secondary | ICD-10-CM | POA: Diagnosis not present

## 2018-02-09 DIAGNOSIS — E1122 Type 2 diabetes mellitus with diabetic chronic kidney disease: Secondary | ICD-10-CM | POA: Diagnosis not present

## 2018-02-09 DIAGNOSIS — Z992 Dependence on renal dialysis: Secondary | ICD-10-CM | POA: Diagnosis not present

## 2018-02-09 DIAGNOSIS — K7460 Unspecified cirrhosis of liver: Secondary | ICD-10-CM | POA: Diagnosis not present

## 2018-02-09 DIAGNOSIS — Z9049 Acquired absence of other specified parts of digestive tract: Secondary | ICD-10-CM | POA: Diagnosis not present

## 2018-02-09 DIAGNOSIS — Z7982 Long term (current) use of aspirin: Secondary | ICD-10-CM | POA: Diagnosis not present

## 2018-02-09 DIAGNOSIS — G8918 Other acute postprocedural pain: Secondary | ICD-10-CM | POA: Diagnosis not present

## 2018-02-09 DIAGNOSIS — Z794 Long term (current) use of insulin: Secondary | ICD-10-CM | POA: Diagnosis not present

## 2018-02-09 DIAGNOSIS — R1031 Right lower quadrant pain: Secondary | ICD-10-CM | POA: Diagnosis not present

## 2018-02-09 DIAGNOSIS — N186 End stage renal disease: Secondary | ICD-10-CM | POA: Diagnosis not present

## 2018-02-09 DIAGNOSIS — F17200 Nicotine dependence, unspecified, uncomplicated: Secondary | ICD-10-CM | POA: Diagnosis not present

## 2018-02-09 DIAGNOSIS — Z79899 Other long term (current) drug therapy: Secondary | ICD-10-CM | POA: Diagnosis not present

## 2018-02-10 ENCOUNTER — Ambulatory Visit (INDEPENDENT_AMBULATORY_CARE_PROVIDER_SITE_OTHER): Payer: Medicare Other | Admitting: Family Medicine

## 2018-02-10 ENCOUNTER — Ambulatory Visit (INDEPENDENT_AMBULATORY_CARE_PROVIDER_SITE_OTHER): Payer: Medicare Other

## 2018-02-10 ENCOUNTER — Encounter: Payer: Self-pay | Admitting: Family Medicine

## 2018-02-10 VITALS — BP 126/70 | HR 59 | Temp 97.6°F | Ht 63.0 in | Wt 154.0 lb

## 2018-02-10 DIAGNOSIS — E11319 Type 2 diabetes mellitus with unspecified diabetic retinopathy without macular edema: Secondary | ICD-10-CM

## 2018-02-10 DIAGNOSIS — I10 Essential (primary) hypertension: Secondary | ICD-10-CM | POA: Diagnosis not present

## 2018-02-10 DIAGNOSIS — M79642 Pain in left hand: Secondary | ICD-10-CM | POA: Diagnosis not present

## 2018-02-10 DIAGNOSIS — Z794 Long term (current) use of insulin: Secondary | ICD-10-CM

## 2018-02-10 DIAGNOSIS — IMO0001 Reserved for inherently not codable concepts without codable children: Secondary | ICD-10-CM

## 2018-02-10 DIAGNOSIS — E1142 Type 2 diabetes mellitus with diabetic polyneuropathy: Secondary | ICD-10-CM | POA: Diagnosis not present

## 2018-02-10 DIAGNOSIS — M79641 Pain in right hand: Secondary | ICD-10-CM

## 2018-02-10 LAB — HEMOGLOBIN A1C: Hemoglobin A1C: 6.9

## 2018-02-10 LAB — BAYER DCA HB A1C WAIVED: HB A1C: 6.9 % (ref <7.0)

## 2018-02-10 NOTE — Patient Instructions (Addendum)
Great to see you!  We will let you know what your xray shows  You are doing great with your diabetes  Come back in 3 months for follow up

## 2018-02-10 NOTE — Progress Notes (Signed)
   HPI  Patient presents today for follow-up chronic medical conditions.  Type 2 diabetes Good medication compliance and tolerance No hypoglycemia 18 units of Lantus daily Watching diet moderately.  Bilateral hand pain Going on for months, several times a week, lasting hours Difficult to describe, denies any burning or numbness or tingling sensations. Distributed across all 10 fingers.  Abdominal pain-seen in the ER last night, resolved Recent concern about cirrhosis- status post biopsy, results not returned yet, has follow-up for results  PMH: Smoking status noted ROS: Per HPI  Objective: BP 126/70   Pulse (!) 59   Temp 97.6 F (36.4 C) (Oral)   Ht 5' 3"  (1.6 m)   Wt 154 lb (69.9 kg)   LMP 05/05/2015   BMI 27.28 kg/m  Gen: NAD, alert, cooperative with exam HEENT: NCAT CV: RRR, good S1/S2, no murmur Resp: CTABL, no wheezes, non-labored Ext: No edema, warm Neuro: Alert and oriented, No gross deficits  Assessment and plan:  # T2Dm Controlled, A1C 6.9, see endo on Friday No chnages  #Hypertension Well controlled No changes  #Diabetic neuropathy Caution gabapentin with ESRD No changes for new  #Bilateral hand pain Plain film today, possible arthritis, possible neuropathy  Pain medications limited given ESRD on dialysis and concern for liver disease    Orders Placed This Encounter  Procedures  . DG Hand Complete Right    Standing Status:   Future    Standing Expiration Date:   04/14/2019    Order Specific Question:   Reason for Exam (SYMPTOM  OR DIAGNOSIS REQUIRED)    Answer:   BL hand pain    Order Specific Question:   Is patient pregnant?    Answer:   No    Order Specific Question:   Preferred imaging location?    Answer:   Internal    Order Specific Question:   Radiology Contrast Protocol - do NOT remove file path    Answer:   \\[email or web address]  . DG Hand Complete Left    Standing Status:   Future   Standing Expiration Date:   04/14/2019    Order Specific Question:   Reason for Exam (SYMPTOM  OR DIAGNOSIS REQUIRED)    Answer:   BL hand pain    Order Specific Question:   Is patient pregnant?    Answer:   No    Order Specific Question:   Preferred imaging location?    Answer:   Internal    Order Specific Question:   Radiology Contrast Protocol - do NOT remove file path    Answer:   \\[email or web address]  . Microalbumin / creatinine urine ratio  . Bayer DCA Hb A1c Waived  . Lipid panel  . CBC with Differential/Platelet  . Daggett, MD [place] 02/10/2018, 2:59 PM

## 2018-02-11 DIAGNOSIS — D509 Iron deficiency anemia, unspecified: Secondary | ICD-10-CM | POA: Diagnosis not present

## 2018-02-11 DIAGNOSIS — Z992 Dependence on renal dialysis: Secondary | ICD-10-CM | POA: Diagnosis not present

## 2018-02-11 DIAGNOSIS — N250 Renal osteodystrophy: Secondary | ICD-10-CM | POA: Diagnosis not present

## 2018-02-11 DIAGNOSIS — D631 Anemia in chronic kidney disease: Secondary | ICD-10-CM | POA: Diagnosis not present

## 2018-02-11 DIAGNOSIS — N186 End stage renal disease: Secondary | ICD-10-CM | POA: Diagnosis not present

## 2018-02-11 LAB — LIPID PANEL
CHOLESTEROL TOTAL: 182 mg/dL (ref 100–199)
Chol/HDL Ratio: 3.5 ratio (ref 0.0–4.4)
HDL: 52 mg/dL (ref >39)
LDL Calculated: 92 mg/dL (ref 0–99)
Triglycerides: 192 mg/dL — ABNORMAL HIGH (ref 0–149)
VLDL CHOLESTEROL CAL: 38 mg/dL (ref 5–40)

## 2018-02-11 LAB — BMP8+EGFR
BUN/Creatinine Ratio: 7 — ABNORMAL LOW (ref 9–23)
BUN: 44 mg/dL — ABNORMAL HIGH (ref 6–24)
CALCIUM: 8.8 mg/dL (ref 8.7–10.2)
CHLORIDE: 88 mmol/L — AB (ref 96–106)
CO2: 24 mmol/L (ref 20–29)
Creatinine, Ser: 6.56 mg/dL (ref 0.57–1.00)
GFR calc Af Amer: 8 mL/min/{1.73_m2} — ABNORMAL LOW (ref >59)
GFR, EST NON AFRICAN AMERICAN: 7 mL/min/{1.73_m2} — AB (ref >59)
Glucose: 251 mg/dL — ABNORMAL HIGH (ref 65–99)
Potassium: 4.7 mmol/L (ref 3.5–5.2)
SODIUM: 130 mmol/L — AB (ref 134–144)

## 2018-02-11 LAB — CBC WITH DIFFERENTIAL/PLATELET
BASOS: 1 %
Basophils Absolute: 0.1 10*3/uL (ref 0.0–0.2)
EOS (ABSOLUTE): 0.2 10*3/uL (ref 0.0–0.4)
EOS: 3 %
HEMATOCRIT: 29.5 % — AB (ref 34.0–46.6)
Hemoglobin: 10.4 g/dL — ABNORMAL LOW (ref 11.1–15.9)
Immature Grans (Abs): 0 10*3/uL (ref 0.0–0.1)
Immature Granulocytes: 0 %
LYMPHS ABS: 1.4 10*3/uL (ref 0.7–3.1)
Lymphs: 19 %
MCH: 33.7 pg — ABNORMAL HIGH (ref 26.6–33.0)
MCHC: 35.3 g/dL (ref 31.5–35.7)
MCV: 96 fL (ref 79–97)
MONOS ABS: 0.7 10*3/uL (ref 0.1–0.9)
Monocytes: 9 %
Neutrophils Absolute: 5 10*3/uL (ref 1.4–7.0)
Neutrophils: 68 %
Platelets: 187 10*3/uL (ref 150–450)
RBC: 3.09 x10E6/uL — ABNORMAL LOW (ref 3.77–5.28)
RDW: 14.6 % (ref 12.3–15.4)
WBC: 7.3 10*3/uL (ref 3.4–10.8)

## 2018-02-13 DIAGNOSIS — Z992 Dependence on renal dialysis: Secondary | ICD-10-CM | POA: Diagnosis not present

## 2018-02-13 DIAGNOSIS — N186 End stage renal disease: Secondary | ICD-10-CM | POA: Diagnosis not present

## 2018-02-13 DIAGNOSIS — D631 Anemia in chronic kidney disease: Secondary | ICD-10-CM | POA: Diagnosis not present

## 2018-02-13 DIAGNOSIS — N250 Renal osteodystrophy: Secondary | ICD-10-CM | POA: Diagnosis not present

## 2018-02-13 DIAGNOSIS — D509 Iron deficiency anemia, unspecified: Secondary | ICD-10-CM | POA: Diagnosis not present

## 2018-02-14 ENCOUNTER — Ambulatory Visit (INDEPENDENT_AMBULATORY_CARE_PROVIDER_SITE_OTHER): Payer: Medicare Other | Admitting: "Endocrinology

## 2018-02-14 ENCOUNTER — Encounter: Payer: Self-pay | Admitting: "Endocrinology

## 2018-02-14 VITALS — BP 134/79 | HR 62 | Ht 63.0 in | Wt 152.0 lb

## 2018-02-14 DIAGNOSIS — F17200 Nicotine dependence, unspecified, uncomplicated: Secondary | ICD-10-CM | POA: Diagnosis not present

## 2018-02-14 DIAGNOSIS — N186 End stage renal disease: Secondary | ICD-10-CM | POA: Diagnosis not present

## 2018-02-14 DIAGNOSIS — E782 Mixed hyperlipidemia: Secondary | ICD-10-CM | POA: Diagnosis not present

## 2018-02-14 DIAGNOSIS — E1122 Type 2 diabetes mellitus with diabetic chronic kidney disease: Secondary | ICD-10-CM

## 2018-02-14 DIAGNOSIS — I10 Essential (primary) hypertension: Secondary | ICD-10-CM | POA: Diagnosis not present

## 2018-02-14 MED ORDER — INSULIN GLARGINE 100 UNIT/ML SOLOSTAR PEN: 14.0000 [IU] | PEN_INJECTOR | Freq: Every day | SUBCUTANEOUS | 2 refills | Status: DC

## 2018-02-14 MED ORDER — LINAGLIPTIN 5 MG PO TABS: 5.0000 mg | ORAL_TABLET | Freq: Every day | ORAL | 3 refills | Status: DC

## 2018-02-14 NOTE — Patient Instructions (Signed)

## 2018-02-14 NOTE — Progress Notes (Signed)
Endocrinology follow up note       02/14/2018, 11:08 AM   Subjective:    Patient ID: Kara Knapp, female    DOB: 1970/10/18.  Kara Knapp is being seen in follow-up for management of currently uncontrolled symptomatic diabetes requested by  Timmothy Euler, MD.   Past Medical History:  Diagnosis Date  . Anemia of chronic disease   . Asthma   . Blind left eye   . Bronchitis   . Cataract   . Cholecystitis, acute 05/26/2013   Status post cholecystectomy  . Chronic abdominal pain   . Chronic diarrhea   . Depression   . Diabetic foot ulcer ([place]) 03/01/2015  . Diastolic heart failure ([place])   . ESRD on hemodialysis ([place])    Started diaylsis 12/29/15  . Essential hypertension   . Fibroids   . Glaucoma   . History of blood transfusion   . History of pneumonia   . Hyperlipidemia   . Insulin-dependent diabetes mellitus with retinopathy ([place])   . Neuropathy   . Osteomyelitis ([place])    Toe on left foot   Past Surgical History:  Procedure Laterality Date  . A/V SHUNTOGRAM N/A 10/25/2016   Procedure: A/V Shuntogram - Right Arm;  Surgeon: Waynetta Sandy, MD;  Location: [facility];  Service: Cardiovascular;  Laterality: N/A;  . AV FISTULA PLACEMENT Right 10/17/2015   Procedure: INSERTION OF ARTERIOVENOUS GORE-TEX GRAFT RIGHT UPPER ARM WITH ACUSEAL;  Surgeon: Conrad Rake, MD;  Location: [facility];  Service: Vascular;  Laterality: Right;  . CATARACT EXTRACTION W/ INTRAOCULAR LENS IMPLANT Bilateral   . CESAREAN SECTION    . CHOLECYSTECTOMY N/A 05/25/2013   Procedure: LAPAROSCOPIC CHOLECYSTECTOMY;  Surgeon: Jamesetta So, MD;  Location: [facility];  Service: General;  Laterality: N/A;  . COLONOSCOPY WITH PROPOFOL N/A 10/02/2016   Procedure: COLONOSCOPY WITH PROPOFOL;  Surgeon: Danie Binder, MD;  [facility];  Service: Endoscopy;  Laterality: N/A;  145 - pt knows to arrive at  11:15 per office  . ESOPHAGOGASTRODUODENOSCOPY (EGD) WITH PROPOFOL N/A 10/02/2016   Procedure: ESOPHAGOGASTRODUODENOSCOPY (EGD) WITH PROPOFOL;  Surgeon: Danie Binder, MD;  [facility];  Service: Endoscopy;  Laterality: N/A;  . EYE SURGERY Bilateral   . PARS PLANA VITRECTOMY Left 11/24/2014   Procedure: PARS PLANA VITRECTOMY WITH 25 GAUGE;  Surgeon: Hurman Horn, MD;  Location: [facility];  Service: Ophthalmology;  Laterality: Left;  . PERIPHERAL VASCULAR CATHETERIZATION N/A 04/28/2015   Procedure: Bilateral Upper Extremity Venography;  Surgeon: Conrad [place], MD;  Location: [facility];  Service: Cardiovascular;  Laterality: N/A;  . PHOTOCOAGULATION WITH LASER Left 11/24/2014   Procedure: PHOTOCOAGULATION WITH LASER;  Surgeon: Hurman Horn, MD;  Location: [facility];  Service: Ophthalmology;  Laterality: Left;  with insertion of silicone oil  . SAVORY DILATION N/A 10/02/2016   Procedure: SAVORY DILATION;  Surgeon: Danie Binder, MD;  [facility];  Service: Endoscopy;  Laterality: N/A;  . TUBAL LIGATION     Social History   Socioeconomic History  . Marital status: Single  Spouse name: Not on file  . Number of children: 5  . Years of education: GED  . Highest education level: Not on file  Occupational History  . Not on file  Social Needs  . Financial resource strain: Not on file  . Food insecurity:    Worry: Not on file    Inability: Not on file  . Transportation needs:    Medical: Not on file    Non-medical: Not on file  Tobacco Use  . Smoking status: Current Every Day Smoker    Packs/day: 1.50    Years: 23.00    Pack years: 34.50    Types: Cigarettes  . Smokeless tobacco: Never Used  . Tobacco comment: one pack daily  Substance and Sexual Activity  . Alcohol use: No    Alcohol/week: 0.0 oz    Frequency: Never  . Drug use: No    Comment: Sober for 8 years  . Sexual activity: Not Currently    Birth control/protection: Surgical  Lifestyle  . Physical  activity:    Days per week: Not on file    Minutes per session: Not on file  . Stress: Not on file  Relationships  . Social connections:    Talks on phone: Not on file    Gets together: Not on file    Attends religious service: Not on file    Active member of club or organization: Not on file    Attends meetings of clubs or organizations: Not on file    Relationship status: Not on file  Other Topics Concern  . Not on file  Social History Narrative  . Not on file   Outpatient Encounter Medications as of 02/14/2018  Medication Sig  . acetaminophen (TYLENOL) 500 MG tablet Take 1,000 mg by mouth every 6 (six) hours as needed for mild pain or moderate pain.   Marland Kitchen aspirin 81 MG tablet Take 81 mg by mouth daily.  . brimonidine-timolol (COMBIGAN) 0.2-0.5 % ophthalmic solution Place 1 drop into both eyes every 12 (twelve) hours.  . carvedilol (COREG) 12.5 MG tablet TAKE ONE TABLET BY MOUTH TWICE DAILY  . cholecalciferol (VITAMIN D) 1000 units tablet Take 1,000 Units by mouth daily.   . citalopram (CELEXA) 20 MG tablet TAKE ONE TABLET BY MOUTH DAILY.  Marland Kitchen Continuous Blood Gluc Sensor (FREESTYLE LIBRE 14 DAY SENSOR) MISC Inject 1 each into the skin every 14 (fourteen) days. Use as directed.  . cyclobenzaprine (FLEXERIL) 5 MG tablet Take 1 tablet (5 mg total) by mouth 3 (three) times daily as needed for muscle spasms.  . fluticasone (FLONASE) 50 MCG/ACT nasal spray Place 2 sprays into both nostrils daily. (Patient taking differently: Place 2 sprays into both nostrils daily as needed for allergies. )  . gabapentin (NEURONTIN) 100 MG capsule TAKE ONE CAPSULE BY MOUTH THREE TIMES DAILY  . glucose blood (PRODIGY NO CODING BLOOD GLUC) test strip USE TO CHECK BLOOD SUGAR TWICE DAILY.  . hydrALAZINE (APRESOLINE) 25 MG tablet TAKE ONE TABLET BY MOUTH THREE TIMES DAILY. (Patient taking differently: TAKE 25MG BY MOUTH TWICE DAILY)  . Insulin Glargine (LANTUS SOLOSTAR) 100 UNIT/ML Solostar Pen Inject 14 Units  into the skin at bedtime.  . Insulin Pen Needle (PEN NEEDLES) 31G X 5 MM MISC 1 Device by Does not apply route daily.  Marland Kitchen linagliptin (TRADJENTA) 5 MG TABS tablet Take 1 tablet (5 mg total) by mouth daily.  . pantoprazole (PROTONIX) 40 MG tablet TAKE 1 BY MOUTH DAILY 30 minutes before breakfast  .  PRODIGY TWIST TOP LANCETS 28G MISC USE TO CHECK BLOOD SUGAR UP TO FOUR TIMES DAILY.  . sevelamer carbonate (RENVELA) 800 MG tablet Take 2,400 mg by mouth 3 (three) times daily with meals. And with snacks.  . traMADol (ULTRAM) 50 MG tablet Take 1 tablet by mouth 3 (three) times daily.  . traZODone (DESYREL) 50 MG tablet TAKE 1/2 TO 1 TABLET BY MOUTH AT BEDTIME AS NEEDED FOR SLEEP  . [DISCONTINUED] Insulin Glargine (LANTUS SOLOSTAR) 100 UNIT/ML Solostar Pen Inject 18 Units into the skin daily at 10 pm.  . [DISCONTINUED] Insulin Glargine (LANTUS SOLOSTAR) 100 UNIT/ML Solostar Pen Inject 18 Units into the skin at bedtime.   No facility-administered encounter medications on file as of 02/14/2018.     ALLERGIES: Allergies  Allergen Reactions  . Bactrim [Sulfamethoxazole-Trimethoprim] Nausea And Vomiting  . Prednisone Other (See Comments)    "I was wide open and couldn't eat" per pt.     VACCINATION STATUS: Immunization History  Administered Date(s) Administered  . Influenza,inj,Quad PF,6+ Mos 05/22/2013, 05/11/2015  . Influenza-Unspecified 04/10/2016  . Pneumococcal Polysaccharide-23 05/22/2013  . Tdap 12/21/2014    Diabetes  She presents for her follow-up diabetic visit. She has type 2 diabetes mellitus. Onset time: She was diagnosed at approximately age of 68 years. Her disease course has been improving. There are no hypoglycemic associated symptoms. Pertinent negatives for hypoglycemia include no confusion, headaches, pallor or seizures. Associated symptoms include fatigue, foot paresthesias and visual change. Pertinent negatives for diabetes include no chest pain, no polydipsia, no polyphagia  and no polyuria. There are no hypoglycemic complications. Symptoms are improving. Diabetic complications include nephropathy, peripheral neuropathy and retinopathy. (She has end-stage renal disease on hemodialysis since age of 30 years.  She has very little vision left on the right eye, legally blind on the left.  ) Risk factors for coronary artery disease include diabetes mellitus, hypertension, sedentary lifestyle and tobacco exposure. Current diabetic treatment includes insulin injections (She is currently on Lantus 12 units at bedtime and Humalog 4 units 3 times daily AC.  She does this insulin by counting the number of clicks on her pain due to visual impairment.). Compliance with diabetes treatment: Has documented history of noncompliance/nonadherence. Her weight is fluctuating minimally. She is following a generally unhealthy diet. When asked about meal planning, she reported none. She has not had a previous visit with a dietitian. She never participates in exercise. (She did not bring her meter nor logs to review today.  Her recent labs show A1c of 6.9% significant improvement from 9.6%.) An ACE inhibitor/angiotensin II receptor blocker is not being taken. She does not see a podiatrist.Eye exam is current.  Hypertension  This is a chronic problem. The current episode started more than 1 year ago. The problem is uncontrolled. Pertinent negatives include no chest pain, headaches, palpitations or shortness of breath. Risk factors for coronary artery disease include smoking/tobacco exposure, sedentary lifestyle and diabetes mellitus. Past treatments include beta blockers. Hypertensive end-organ damage includes kidney disease and retinopathy. Identifiable causes of hypertension include chronic renal disease.     Review of Systems  Constitutional: Positive for fatigue. Negative for chills, fever and unexpected weight change.  HENT: Negative for trouble swallowing and voice change.   Eyes: Negative for  visual disturbance.  Respiratory: Negative for cough, shortness of breath and wheezing.   Cardiovascular: Negative for chest pain, palpitations and leg swelling.  Gastrointestinal: Negative for diarrhea, nausea and vomiting.  Endocrine: Negative for cold intolerance, heat intolerance, polydipsia, polyphagia  and polyuria.  Musculoskeletal: Positive for gait problem. Negative for arthralgias and myalgias.       She uses a walker mainly due to her visual impairment.  Skin: Negative for color change, pallor, rash and wound.  Neurological: Negative for seizures and headaches.  Psychiatric/Behavioral: Negative for confusion and suicidal ideas.    Objective:    BP 134/79   Pulse 62   Ht 5' 3"  (1.6 m)   Wt 152 lb (68.9 kg)   LMP 05/05/2015   BMI 26.93 kg/m   Wt Readings from Last 3 Encounters:  02/14/18 152 lb (68.9 kg)  02/10/18 154 lb (69.9 kg)  02/07/18 147 lb (66.7 kg)     Physical Exam  Constitutional: She is oriented to person, place, and time. She appears well-developed.  HENT:  Head: Normocephalic and atraumatic.  Eyes: EOM are normal.  Neck: Normal range of motion. Neck supple. No tracheal deviation present. No thyromegaly present.  Cardiovascular: Normal rate.  Pulmonary/Chest: Effort normal.  Abdominal: There is no tenderness. There is no guarding.  Musculoskeletal: Normal range of motion. She exhibits no edema.  Neurological: She is alert and oriented to person, place, and time. She displays abnormal reflex. A cranial nerve deficit and sensory deficit is present. Coordination normal.  She has very little vision on the right eye, legally blind on the left.  Skin: Skin is warm and dry. No rash noted. No erythema. No pallor.  Psychiatric: She has a normal mood and affect. Judgment normal.    CMP     Component Value Date/Time   NA 130 (L) 02/10/2018 1517   K 4.7 02/10/2018 1517   CL 88 (L) 02/10/2018 1517   CO2 24 02/10/2018 1517   GLUCOSE 251 (H) 02/10/2018 1517    GLUCOSE 472 (H) 09/23/2017 1119   BUN 44 (H) 02/10/2018 1517   CREATININE 6.56 (HH) 02/10/2018 1517   CREATININE 3.97 (H) 09/23/2017 1119   CALCIUM 8.8 02/10/2018 1517   PROT 6.1 09/23/2017 1119   PROT 6.0 05/13/2017 1437   ALBUMIN 3.2 (L) 09/07/2017 1028   ALBUMIN 3.5 05/13/2017 1437   AST 17 09/23/2017 1119   ALT 13 09/23/2017 1119   ALKPHOS 343 (H) 09/07/2017 1028   BILITOT 0.5 09/23/2017 1119   BILITOT 0.4 05/13/2017 1437   GFRNONAA 7 (L) 02/10/2018 1517   GFRAA 8 (L) 02/10/2018 1517     Diabetic Labs (most recent): Lab Results  Component Value Date   HGBA1C 6.9 02/10/2018   HGBA1C 9.6 09/27/2017   HGBA1C 7.4 09/06/2015     Lipid Panel ( most recent) Lipid Panel     Component Value Date/Time   CHOL 182 02/10/2018 1517   TRIG 192 (H) 02/10/2018 1517   HDL 52 02/10/2018 1517   CHOLHDL 3.5 02/10/2018 1517   LDLCALC 92 02/10/2018 1517      Lab Results  Component Value Date   TSH 4.668 (H) 01/21/2015           Assessment & Plan:   1. Type 2 diabetes mellitus with ESRD (end-stage renal disease) , and advanced retinopathy with legal blindness  - Ryen A Hounshell has currently uncontrolled symptomatic type 2 DM since 47 years of age. -She returns without her meter nor logs.  Her A1c is 6.9% significantly improving from 9.6%.    - Recent labs reviewed.  -her diabetes is complicated by end-stage renal disease on hemodialysis since age 74, legal blindness due to advanced retinopathy, peripheral neuropathy and Shaquanna A Winfree remains  at a high risk for more acute and chronic complications which include CAD, CVA, CKD, retinopathy, and neuropathy. These are all discussed in detail with the patient.  - I have counseled her on diet management by adopting a carbohydrate restricted/protein rich diet.  -  Suggestion is made for her to avoid simple carbohydrates  from her diet including Cakes, Sweet Desserts / Pastries, Ice Cream, Soda (diet and regular), Sweet Tea,  Candies, Chips, Cookies, Store Bought Juices, Alcohol in Excess of  1-2 drinks a day, Artificial Sweeteners, and "Sugar-free" Products. This will help patient to have stable blood glucose profile and potentially avoid unintended weight gain.   - I encouraged her to switch to  unprocessed or minimally processed complex starch and increased protein intake (animal or plant source), fruits, and vegetables.  - she is advised to stick to a routine mealtimes to eat 3 meals  a day and avoid unnecessary snacks ( to snack only to correct hypoglycemia).   - I have approached her with the following individualized plan to manage diabetes and patient agrees:   -This patient is at extremely high risk for hypoglycemia due to visual impairment. -#1 goal in treating her diabetes would be to avoid hypoglycemia by continued simplified treatment program as much as possible.   -She has a few family members that she says she can ask for help specifically for dosing the insulin.   -I discussed and initiated a prescription with Tradjenta 5 mg p.o. Daily in the  hope of simplifying her insulin treatment even further.  -I advised her to lower her Lantus to 14 units nightly, associated with monitoring of blood glucose 2 times a day times daily before breakfast and at bedtime and as needed.  -If she requires multiple daily injections of insulin, she would benefit from premixed insulin such as Humalog 75/25 or NovoLog 38/25 for simplicity reasons.  -She is not a suitable candidate for metformin, SGLT2 inhibitors, nor incretin therapy.   -Patient is encouraged to call clinic for blood glucose levels less than 70 or above 200 mg /dl.  - Patient specific target  A1c;  LDL, HDL, Triglycerides, and  Waist Circumference were discussed in detail.  2) BP/HTN: Her blood pressure is controlled to target.   She is currently on beta-blockers.   3) Lipids/HPL:   Her recent lipid panel show uncontrolled LDL of 113.   Patient is not on  statins, will be considered for low-dose statins after subsequent fasting lipid panel.  4)  Weight/Diet: She does not have weight to lose, however would benefit from diabetes education.  CDE Consult has been  initiated , exercise, and detailed carbohydrates information provided.  5) Chronic Care/Health Maintenance:  -she  Is not on ACEI/ARB and Statin medications and  is encouraged to continue to follow up with Ophthalmology, Dentist, nephrology, podiatrist at least yearly or according to recommendations, and advised to  quit smoking. I have recommended yearly flu vaccine and pneumonia vaccination at least every 5 years; moderate intensity exercise for up to 150 minutes weekly; and  sleep for at least 7 hours a day.  - I advised patient to maintain close follow up with Timmothy Euler, MD for primary care needs.  - Time spent with the patient: 25 min, of which >50% was spent in  discussing her hypo- and hyper-glycemic episodes, reviewing her current and  previous labs and insulin doses and developing a plan to avoid hypo- and hyper-glycemia. Please refer to Patient Instructions for Blood Glucose  Monitoring and Insulin/Medications Dosing Guide"  in media tab for additional information. Wesleigh A Aliberti participated in the discussions, expressed understanding, and voiced agreement with the above plans.  All questions were answered to her satisfaction. she is encouraged to contact clinic should she have any questions or concerns prior to her return visit.   Follow up plan: - Return in about 3 months (around 05/17/2018) for follow up with pre-visit labs, meter, and logs.  Glade Lloyd, MD [facility] [address] [place], [place] [address] Phone: [phone]  Fax: [phone]    02/14/2018, 11:08 AM  This note was partially dictated with voice recognition software. Similar sounding words can be transcribed inadequately or may not  be  corrected upon review.

## 2018-02-18 ENCOUNTER — Telehealth: Payer: Self-pay | Admitting: Gastroenterology

## 2018-02-18 DIAGNOSIS — D631 Anemia in chronic kidney disease: Secondary | ICD-10-CM | POA: Diagnosis not present

## 2018-02-18 DIAGNOSIS — D509 Iron deficiency anemia, unspecified: Secondary | ICD-10-CM | POA: Diagnosis not present

## 2018-02-18 DIAGNOSIS — N250 Renal osteodystrophy: Secondary | ICD-10-CM | POA: Diagnosis not present

## 2018-02-18 DIAGNOSIS — N186 End stage renal disease: Secondary | ICD-10-CM | POA: Diagnosis not present

## 2018-02-18 DIAGNOSIS — Z992 Dependence on renal dialysis: Secondary | ICD-10-CM | POA: Diagnosis not present

## 2018-02-18 NOTE — Telephone Encounter (Signed)
Tried to call and could not leave a VM, vm not set up yet.  Forwarding to [place] for results of the liver biopsy.

## 2018-02-18 NOTE — Telephone Encounter (Signed)
Pt has questions about her liver bx results. [phone]

## 2018-02-19 ENCOUNTER — Telehealth: Payer: Self-pay | Admitting: Gastroenterology

## 2018-02-19 NOTE — Telephone Encounter (Signed)
Spoke with pt. Pt is inquiring about her results.

## 2018-02-19 NOTE — Telephone Encounter (Signed)
Please call patient about her biopsy results

## 2018-02-20 DIAGNOSIS — Z992 Dependence on renal dialysis: Secondary | ICD-10-CM | POA: Diagnosis not present

## 2018-02-20 DIAGNOSIS — D631 Anemia in chronic kidney disease: Secondary | ICD-10-CM | POA: Diagnosis not present

## 2018-02-20 DIAGNOSIS — D509 Iron deficiency anemia, unspecified: Secondary | ICD-10-CM | POA: Diagnosis not present

## 2018-02-20 DIAGNOSIS — N186 End stage renal disease: Secondary | ICD-10-CM | POA: Diagnosis not present

## 2018-02-20 DIAGNOSIS — N250 Renal osteodystrophy: Secondary | ICD-10-CM | POA: Diagnosis not present

## 2018-02-21 ENCOUNTER — Telehealth: Payer: Self-pay | Admitting: Gastroenterology

## 2018-02-21 DIAGNOSIS — N186 End stage renal disease: Secondary | ICD-10-CM | POA: Diagnosis not present

## 2018-02-21 DIAGNOSIS — E8770 Fluid overload, unspecified: Secondary | ICD-10-CM | POA: Diagnosis not present

## 2018-02-21 DIAGNOSIS — Z992 Dependence on renal dialysis: Secondary | ICD-10-CM | POA: Diagnosis not present

## 2018-02-21 NOTE — Progress Notes (Signed)
Kara Knapp is aware this is Dr. Oneida Alar pt and he will discuss with her.

## 2018-02-21 NOTE — Telephone Encounter (Signed)
See phone note of 02/18/2018.

## 2018-02-21 NOTE — Progress Notes (Signed)
PT is aware and I am forwarding to [place] to make the referral.

## 2018-02-21 NOTE — Telephone Encounter (Signed)
Kara Knapp, pt is stressing over her liver biopsy results. Has called almost everyday this week. Please advise!

## 2018-02-21 NOTE — Telephone Encounter (Signed)
Pt calling again this morning asking about her liver bx results. Call transferred to DS

## 2018-02-22 DIAGNOSIS — Z992 Dependence on renal dialysis: Secondary | ICD-10-CM | POA: Diagnosis not present

## 2018-02-22 DIAGNOSIS — N186 End stage renal disease: Secondary | ICD-10-CM | POA: Diagnosis not present

## 2018-02-22 DIAGNOSIS — N250 Renal osteodystrophy: Secondary | ICD-10-CM | POA: Diagnosis not present

## 2018-02-22 DIAGNOSIS — D509 Iron deficiency anemia, unspecified: Secondary | ICD-10-CM | POA: Diagnosis not present

## 2018-02-22 DIAGNOSIS — D631 Anemia in chronic kidney disease: Secondary | ICD-10-CM | POA: Diagnosis not present

## 2018-02-24 ENCOUNTER — Telehealth: Payer: Self-pay | Admitting: Gastroenterology

## 2018-02-24 DIAGNOSIS — R7989 Other specified abnormal findings of blood chemistry: Secondary | ICD-10-CM

## 2018-02-24 NOTE — Addendum Note (Signed)
Addended by: Zara Council C on: 02/24/2018 01:00 PM   Modules accepted: Orders

## 2018-02-24 NOTE — Telephone Encounter (Signed)
See result note, pt is aware of biopsy results.

## 2018-02-24 NOTE — Telephone Encounter (Signed)
PATIENT CALLED AND SAID SHE THOUGHT WE WERE SUPPOSED TO REFER HER TO A LIVER SPECIALIST AND SHE HAS NOT HEARD FROM [place] YET.  PLEASE CALL

## 2018-02-24 NOTE — Telephone Encounter (Signed)
Per liver biopsy result note, referral sent to hematology/oncology. Tried to call pt, no answer, LMOVM referral was sent.

## 2018-02-25 DIAGNOSIS — D509 Iron deficiency anemia, unspecified: Secondary | ICD-10-CM | POA: Diagnosis not present

## 2018-02-25 DIAGNOSIS — Z992 Dependence on renal dialysis: Secondary | ICD-10-CM | POA: Diagnosis not present

## 2018-02-25 DIAGNOSIS — N186 End stage renal disease: Secondary | ICD-10-CM | POA: Diagnosis not present

## 2018-02-25 DIAGNOSIS — D631 Anemia in chronic kidney disease: Secondary | ICD-10-CM | POA: Diagnosis not present

## 2018-02-25 DIAGNOSIS — N250 Renal osteodystrophy: Secondary | ICD-10-CM | POA: Diagnosis not present

## 2018-02-26 ENCOUNTER — Ambulatory Visit (HOSPITAL_COMMUNITY)
Admission: RE | Admit: 2018-02-26 | Discharge: 2018-02-26 | Disposition: A | Discharge status: Home / Self Care | Payer: Medicare Other | Source: Ambulatory Visit | Attending: Vascular Surgery | Admitting: Vascular Surgery

## 2018-02-26 ENCOUNTER — Other Ambulatory Visit: Payer: Self-pay | Admitting: *Deleted

## 2018-02-26 ENCOUNTER — Ambulatory Visit (INDEPENDENT_AMBULATORY_CARE_PROVIDER_SITE_OTHER): Payer: Medicare Other | Admitting: Physician Assistant

## 2018-02-26 ENCOUNTER — Other Ambulatory Visit (HOSPITAL_COMMUNITY): Payer: Self-pay | Admitting: Nephrology

## 2018-02-26 ENCOUNTER — Encounter: Payer: Self-pay | Admitting: *Deleted

## 2018-02-26 VITALS — BP 113/72 | HR 69 | Temp 97.5°F | Resp 16 | Ht 63.0 in | Wt 150.8 lb

## 2018-02-26 DIAGNOSIS — Y832 Surgical operation with anastomosis, bypass or graft as the cause of abnormal reaction of the patient, or of later complication, without mention of misadventure at the time of the procedure: Secondary | ICD-10-CM | POA: Diagnosis not present

## 2018-02-26 DIAGNOSIS — T82510A Breakdown (mechanical) of surgically created arteriovenous fistula, initial encounter: Secondary | ICD-10-CM

## 2018-02-26 DIAGNOSIS — Z992 Dependence on renal dialysis: Secondary | ICD-10-CM | POA: Diagnosis not present

## 2018-02-26 DIAGNOSIS — N186 End stage renal disease: Secondary | ICD-10-CM

## 2018-02-26 DIAGNOSIS — T82858A Stenosis of vascular prosthetic devices, implants and grafts, initial encounter: Secondary | ICD-10-CM | POA: Insufficient documentation

## 2018-02-26 DIAGNOSIS — T82868A Thrombosis of vascular prosthetic devices, implants and grafts, initial encounter: Secondary | ICD-10-CM | POA: Diagnosis not present

## 2018-02-26 NOTE — Progress Notes (Signed)
History of Present Illness:  Patient is a 47 y.o. year old female who presents for evaluation of her malfunctioning right AV graft.    She had flow problems with the graft back on 10/25/2016 and underwent Balloon angioplasty of graft to venous anastomosis with 33m balloon.  She has not had problems since until now.    She reports slow flow rates while on HD.  She denise pain, numbness or loss of motor.  Her skin is intact without ulcers.  There are no signs of pseudoaneurysms.       Past Medical History:  Diagnosis Date  . Anemia of chronic disease   . Asthma   . Blind left eye   . Bronchitis   . Cataract   . Cholecystitis, acute 05/26/2013   Status post cholecystectomy  . Chronic abdominal pain   . Chronic diarrhea   . Depression   . [place] ([place]  . Diastolic heart failure ([place]   . [place] ([place]    Started diaylsis 12/29/15  . Essential hypertension   . Fibroids   . Glaucoma   . History of blood transfusion   . History of pneumonia   . Hyperlipidemia   . Insulin-dependent diabetes mellitus with retinopathy ([place]   . Neuropathy   . [place]omyelitis ([place]    Toe on left foot    Past Surgical History:  Procedure Laterality Date  . A/V SHUNTOGRAM N/A 10/25/2016   Procedure: A/V Shuntogram - Right Arm;  Surgeon: BWaynetta Sandy MD;  [facility];  Service: Cardiovascular;  Laterality: N/A;  . AV FISTULA PLACEMENT Right 10/17/2015   Procedure: INSERTION OF ARTERIOVENOUS GORE-TEX GRAFT RIGHT UPPER ARM WITH ACUSEAL;  Surgeon: BConrad Olancha MD;  [facility]  Service: Vascular;  Laterality: Right;  . CATARACT EXTRACTION W/ INTRAOCULAR LENS IMPLANT Bilateral   . CESAREAN SECTION    . CHOLECYSTECTOMY N/A 05/25/2013   Procedure: LAPAROSCOPIC CHOLECYSTECTOMY;  Surgeon: MJamesetta So MD;  Location: [facility];  Service: General;  Laterality: N/A;  . COLONOSCOPY WITH PROPOFOL N/A 10/02/2016   Procedure: COLONOSCOPY WITH  PROPOFOL;  Surgeon: SDanie Binder MD;  [facility];  Service: Endoscopy;  Laterality: N/A;  145 - pt knows to arrive at 11:15 per office  . ESOPHAGOGASTRODUODENOSCOPY (EGD) WITH PROPOFOL N/A 10/02/2016   Procedure: ESOPHAGOGASTRODUODENOSCOPY (EGD) WITH PROPOFOL;  Surgeon: SDanie Binder MD;  [facility];  Service: Endoscopy;  Laterality: N/A;  . EYE SURGERY Bilateral   . PARS PLANA VITRECTOMY Left 11/24/2014   Procedure: PARS PLANA VITRECTOMY WITH 25 GAUGE;  Surgeon: GHurman Horn MD;  [facility] [facility]  Service: Ophthalmology;  Laterality: Left;  . PERIPHERAL VASCULAR CATHETERIZATION N/A 04/28/2015   Procedure: Bilateral Upper Extremity Venography;  Surgeon: BConrad Tekamah MD;  [facility];  Service: Cardiovascular;  Laterality: N/A;  . PHOTOCOAGULATION WITH LASER Left 11/24/2014   Procedure: PHOTOCOAGULATION WITH LASER;  Surgeon: GHurman Horn MD;  [facility]  Service: Ophthalmology;  Laterality: Left;  with insertion of silicone oil  . SAVORY DILATION N/A 10/02/2016   Procedure: SAVORY DILATION;  Surgeon: SDanie Binder MD;  [facility];  Service: Endoscopy;  Laterality: N/A;  . TUBAL LIGATION       Social History Social History   Tobacco Use  . Smoking status: Current Every Day Smoker    Packs/day: 1.50    Years: 23.00  Pack years: 34.50    Types: Cigarettes  . Smokeless tobacco: Never Used  . Tobacco comment: one pack daily  Substance Use Topics  . Alcohol use: No    Alcohol/week: 0.0 oz    Frequency: Never  . Drug use: No    Comment: Sober for 8 years    Family History Family History  Problem Relation Age of Onset  . COPD Mother   . Cancer Father   . Lymphoma Father   . Diabetes Sister   . Deep vein thrombosis Sister   . Diabetes Brother   . Hyperlipidemia Brother   . Hypertension Brother   . Mental retardation Sister   . Alcohol abuse Paternal Grandmother   . Colon cancer Neg Hx   . Liver disease Neg Hx      Allergies  Allergies  Allergen Reactions  . Bactrim [Sulfamethoxazole-Trimethoprim] Nausea And Vomiting  . Prednisone Other (See Comments)    "I was wide open and couldn't eat" per pt.      Current Outpatient Medications  Medication Sig Dispense Refill  . ANORO ELLIPTA 62.5-25 MCG/INH AEPB INHALE ONE PUFF EVERY DAY.  12  . aspirin 81 MG tablet Take 81 mg by mouth daily.    . brimonidine-timolol (COMBIGAN) 0.2-0.5 % ophthalmic solution Place 1 drop into both eyes every 12 (twelve) hours.    . carvedilol (COREG) 12.5 MG tablet TAKE ONE TABLET BY MOUTH TWICE DAILY 60 tablet 2  . cholecalciferol (VITAMIN D) 1000 units tablet Take 1,000 Units by mouth daily.     . citalopram (CELEXA) 20 MG tablet TAKE ONE TABLET BY MOUTH DAILY. 30 tablet 0  . Continuous Blood Gluc Sensor (FREESTYLE LIBRE 14 DAY SENSOR) MISC Inject 1 each into the skin every 14 (fourteen) days. Use as directed. 2 each 2  . gabapentin (NEURONTIN) 100 MG capsule TAKE ONE CAPSULE BY MOUTH THREE TIMES DAILY 90 capsule 2  . glucose blood (PRODIGY NO CODING BLOOD GLUC) test strip USE TO CHECK BLOOD SUGAR TWICE DAILY. 200 each 3  . hydrALAZINE (APRESOLINE) 25 MG tablet TAKE ONE TABLET BY MOUTH THREE TIMES DAILY. (Patient taking differently: TAKE 25MG BY MOUTH TWICE DAILY) 90 tablet 0  . Insulin Glargine (LANTUS SOLOSTAR) 100 UNIT/ML Solostar Pen Inject 14 Units into the skin at bedtime. 5 pen 2  . Insulin Pen Needle (PEN NEEDLES) 31G X 5 MM MISC 1 Device by Does not apply route daily. 100 each PRN  . linagliptin (TRADJENTA) 5 MG TABS tablet Take 1 tablet (5 mg total) by mouth daily. 30 tablet 3  . pantoprazole (PROTONIX) 40 MG tablet TAKE 1 BY MOUTH DAILY 30 minutes before breakfast 90 tablet 3  . PRODIGY TWIST TOP LANCETS 28G MISC USE TO CHECK BLOOD SUGAR UP TO FOUR TIMES DAILY. 100 each 1  . sevelamer carbonate (RENVELA) 800 MG tablet Take 2,400 mg by mouth 3 (three) times daily with meals. And with snacks.    . traMADol  (ULTRAM) 50 MG tablet Take 1 tablet by mouth 3 (three) times daily.    . traZODone (DESYREL) 50 MG tablet TAKE 1/2 TO 1 TABLET BY MOUTH AT BEDTIME AS NEEDED FOR SLEEP 30 tablet 3  . acetaminophen (TYLENOL) 500 MG tablet Take 1,000 mg by mouth every 6 (six) hours as needed for mild pain or moderate pain.     . cyclobenzaprine (FLEXERIL) 5 MG tablet Take 1 tablet (5 mg total) by mouth 3 (three) times daily as needed for muscle spasms. (Patient not taking:  Reported on 02/26/2018) 30 tablet 0  . fluticasone (FLONASE) 50 MCG/ACT nasal spray Place 2 sprays into both nostrils daily. (Patient not taking: Reported on 02/26/2018) 16 g 6   No current facility-administered medications for this visit.     ROS:   General:  No weight loss, Fever, chills  HEENT: No recent headaches, no nasal bleeding, complete loss of vision, no sore throat  Neurologic: No dizziness, blackouts, seizures. No recent symptoms of stroke or mini- stroke. No recent episodes of slurred speech, or temporary blindness.  Cardiac: No recent episodes of chest pain/pressure, no shortness of breath at rest.  No shortness of breath with exertion.  Denies history of atrial fibrillation or irregular heartbeat  Vascular: No history of rest pain in feet.  No history of claudication.  No history of non-healing ulcer, No history of DVT   Pulmonary: No home oxygen, no productive cough, no hemoptysis,  No asthma or wheezing  Musculoskeletal:  [ ]  Arthritis, [ ]  Low back pain,  [ ]  Joint pain  Hematologic:No history of hypercoagulable state.  No history of easy bleeding.  No history of anemia  Gastrointestinal: No hematochezia or melena,  No gastroesophageal reflux, no trouble swallowing  Urinary: [ ]  chronic Kidney disease, [x ] on HD - [ ]  MWF or [ x] TTHS, [ ]  Burning with urination, [ ]  Frequent urination, [ ]  Difficulty urinating;   Skin: No rashes  Psychological: No history of anxiety,  No history of depression   Physical  Examination  Vitals:   02/26/18 1314  BP: 113/72  Pulse: 69  Resp: 16  Temp: (!) 97.5 F (36.4 C)  TempSrc: Oral  SpO2: 97%  Weight: 150 lb 12.8 oz (68.4 kg)  Height: 5' 3"  (1.6 m)    Body mass index is 26.71 kg/m.  General:  Alert and oriented, no acute distress HEENT: Normal, normocephalic Neck: No bruit or JVD Pulmonary: Clear to auscultation bilaterally Cardiac: Regular Rate and Rhythm without murmur Gastrointestinal: Soft, non-tender, non-distended, no mass, no scars Skin: No rash Extremity Pulses:  2+ radial, brachial pulses bilaterally, palpable thrill in right UE graft. Musculoskeletal: No deformity or edema  Neurologic: Upper and lower extremity motor 5/5 and symmetric  DATA:  Access duplex 02/26/2018 Mid graft partially occluded with velocity of 509 cm/sec   ASSESSMENT/PLAN: Mid upper arm graft thrombus with partial occlusion.  She has completed there HD with varying stick sites.  She has multiple MD appoints coming up soon.  We decided to schedule her for a shuntogram with possible intervention with Dr. Oneida Alar on 03-05-2018.  If she has problems before this she will call us.       Roxy Horseman PA-C [facility]

## 2018-02-26 NOTE — Telephone Encounter (Signed)
Previously reviewed. Patient referred to heme/onc

## 2018-02-27 DIAGNOSIS — E8779 Other fluid overload: Secondary | ICD-10-CM | POA: Diagnosis not present

## 2018-02-27 DIAGNOSIS — D631 Anemia in chronic kidney disease: Secondary | ICD-10-CM | POA: Diagnosis not present

## 2018-02-27 DIAGNOSIS — D509 Iron deficiency anemia, unspecified: Secondary | ICD-10-CM | POA: Diagnosis not present

## 2018-02-27 DIAGNOSIS — Z992 Dependence on renal dialysis: Secondary | ICD-10-CM | POA: Diagnosis not present

## 2018-02-27 DIAGNOSIS — N250 Renal osteodystrophy: Secondary | ICD-10-CM | POA: Diagnosis not present

## 2018-02-27 DIAGNOSIS — N186 End stage renal disease: Secondary | ICD-10-CM | POA: Diagnosis not present

## 2018-02-28 ENCOUNTER — Encounter (HOSPITAL_COMMUNITY): Payer: Self-pay | Admitting: Internal Medicine

## 2018-02-28 ENCOUNTER — Inpatient Hospital Stay (HOSPITAL_COMMUNITY): Payer: Medicare Other

## 2018-02-28 ENCOUNTER — Other Ambulatory Visit: Payer: Self-pay

## 2018-02-28 ENCOUNTER — Inpatient Hospital Stay (HOSPITAL_COMMUNITY): Payer: Medicare Other | Attending: Internal Medicine | Admitting: Internal Medicine

## 2018-02-28 VITALS — BP 165/91 | HR 70 | Resp 16 | Wt 151.3 lb

## 2018-02-28 DIAGNOSIS — D259 Leiomyoma of uterus, unspecified: Secondary | ICD-10-CM | POA: Insufficient documentation

## 2018-02-28 DIAGNOSIS — Z992 Dependence on renal dialysis: Secondary | ICD-10-CM

## 2018-02-28 DIAGNOSIS — Z1239 Encounter for other screening for malignant neoplasm of breast: Secondary | ICD-10-CM

## 2018-02-28 DIAGNOSIS — N179 Acute kidney failure, unspecified: Secondary | ICD-10-CM | POA: Insufficient documentation

## 2018-02-28 DIAGNOSIS — R160 Hepatomegaly, not elsewhere classified: Secondary | ICD-10-CM | POA: Diagnosis not present

## 2018-02-28 DIAGNOSIS — R7989 Other specified abnormal findings of blood chemistry: Secondary | ICD-10-CM | POA: Diagnosis not present

## 2018-02-28 DIAGNOSIS — Z720 Tobacco use: Secondary | ICD-10-CM

## 2018-02-28 LAB — CBC WITH DIFFERENTIAL/PLATELET
BASOS PCT: 0 %
Basophils Absolute: 0 10*3/uL (ref 0.0–0.1)
Eosinophils Absolute: 0.1 10*3/uL (ref 0.0–0.7)
Eosinophils Relative: 2 %
HEMATOCRIT: 33.9 % — AB (ref 36.0–46.0)
HEMOGLOBIN: 11.7 g/dL — AB (ref 12.0–15.0)
LYMPHS ABS: 1.1 10*3/uL (ref 0.7–4.0)
LYMPHS PCT: 14 %
MCH: 33.9 pg (ref 26.0–34.0)
MCHC: 34.5 g/dL (ref 30.0–36.0)
MCV: 98.3 fL (ref 78.0–100.0)
MONOS PCT: 9 %
Monocytes Absolute: 0.7 10*3/uL (ref 0.1–1.0)
NEUTROS ABS: 6.1 10*3/uL (ref 1.7–7.7)
NEUTROS PCT: 75 %
Platelets: 213 10*3/uL (ref 150–400)
RBC: 3.45 MIL/uL — ABNORMAL LOW (ref 3.87–5.11)
RDW: 13.2 % (ref 11.5–15.5)
WBC: 8.1 10*3/uL (ref 4.0–10.5)

## 2018-02-28 LAB — COMPREHENSIVE METABOLIC PANEL
ALBUMIN: 4 g/dL (ref 3.5–5.0)
ALK PHOS: 318 U/L — AB (ref 38–126)
ALT: 44 U/L (ref 0–44)
ANION GAP: 12 (ref 5–15)
AST: 39 U/L (ref 15–41)
BILIRUBIN TOTAL: 0.6 mg/dL (ref 0.3–1.2)
BUN: 30 mg/dL — AB (ref 6–20)
CALCIUM: 9.2 mg/dL (ref 8.9–10.3)
CO2: 28 mmol/L (ref 22–32)
Chloride: 94 mmol/L — ABNORMAL LOW (ref 98–111)
Creatinine, Ser: 4.94 mg/dL — ABNORMAL HIGH (ref 0.44–1.00)
GFR calc Af Amer: 11 mL/min — ABNORMAL LOW (ref >60)
GFR, EST NON AFRICAN AMERICAN: 10 mL/min — AB (ref >60)
GLUCOSE: 140 mg/dL — AB (ref 70–99)
Potassium: 3.5 mmol/L (ref 3.5–5.1)
Sodium: 134 mmol/L — ABNORMAL LOW (ref 135–145)
TOTAL PROTEIN: 8 g/dL (ref 6.5–8.1)

## 2018-02-28 LAB — IRON AND TIBC
IRON: 102 ug/dL (ref 28–170)
Saturation Ratios: 26 % (ref 10.4–31.8)
TIBC: 395 ug/dL (ref 250–450)
UIBC: 293 ug/dL

## 2018-02-28 LAB — LACTATE DEHYDROGENASE: LDH: 160 U/L (ref 98–192)

## 2018-02-28 LAB — FERRITIN: Ferritin: 901 ng/mL — ABNORMAL HIGH (ref 11–307)

## 2018-02-28 NOTE — Progress Notes (Signed)
Referring physician: Dr. Oneida Alar  Diagnosis Elevated ferritin - Plan: CBC with Differential/Platelet, Comprehensive metabolic panel, Lactate dehydrogenase, Ferritin, Iron and TIBC, Transferrin Saturation, Hemochromatosis DNA-PCR(c282y,h63d), Erythropoietin  Tobacco use - Plan: CBC with Differential/Platelet, Comprehensive metabolic panel, Lactate dehydrogenase, Ferritin, Iron and TIBC, Transferrin Saturation, Hemochromatosis DNA-PCR(c282y,h63d), Erythropoietin, CT Chest Wo Contrast  Screening breast examination - Plan: MM Digital Screening  Staging Cancer Staging No matching staging information was found for the patient.  Assessment and Plan: 1.  Iron overload/enlarged liver..  Pt is a dialysis patient and reports she was recently diagnosed with a thrombus in her graft.  She underwent a liver biopsy on 02/07/2018 with pathology returning as iron overload.  Findings were suspected to be secondary.  When questioned she reports she has been getting regular iron infusion.  Review of chart shows she had labs done 02/10/2018 that showed a white count of 7.3 hemoglobin 10.4 platelets 187,000.  Chemistries were within normal limits other than a creatinine of 6.56.  Her potassium was 4.7.  She denies any family history of liver disorders.  Patient is seen today for consultation due to suspicion for iron overload.  Labs done 02/28/2018 reviewed and show a white count of 8.1  hemoglobin 11.7  platelets 213,000.  Creatinine is 4.94  potassium 3.5.  Ferritin is 901 transferrin saturation is 26.    Long talk held with the patient and family member.  I have discussed with her likely etiology of iron overload may be due to frequent iron infusions as she reports she has been receiving this with dialysis.  She will undergo evaluation for hemochromatosis.  If gene evaluation is negative we will discuss with Dr. Lowanda Foster recent findings on liver biopsy.  Patient will return to clinic in 2 weeks to go over the results.     I have discussed with her if she has evidence of hemochromatosis she will be recommended for phlebotomy.  She should follow-up with GI as recommended.  2.  Renal failure.  Patient is on dialysis.  Creatinine on labs done 02/28/2018 was 4.94.  Potassium is 3.5.  3.  Graft thrombus.  This was noted on ultrasound done on 02/26/2018 in right AV graft.  She was reports she is scheduled for vascular evaluation.  She should follow-up with nephrology as directed.  4.  Uterine fibroids.  This was noted on CT that was done September 07, 2017.  She should follow-up with GYN.    HPI: 47 year old female referred by Dr. Oneida Alar for evaluation of iron overload.  She is a dialysis patient and reports she was recently diagnosed with a thrombus in her graft.  She underwent a liver biopsy on 02/07/2018 with pathology returning as iron overload.  Findings were suspected to be secondary.  When questioned she reports she has been getting regular iron infusion.  Review of chart shows she had labs done 02/10/2018 that showed a white count of 7.3 hemoglobin 10.4 platelets 187,000.  Chemistries were within normal limits other than a creatinine of 6.56.  Her potassium was 4.7.  She denies any family history of liver disorders.  Patient is seen today for consultation due to suspicion for iron overload.  Problem List Patient Active Problem List   Diagnosis Date Noted  . Flatulence [R14.3] 12/18/2017  . Mixed hyperlipidemia [E78.2] 11/28/2017  . Constipation [K59.00] 09/18/2017  . Abdominal pain [R10.9] 09/18/2017  . Recurrent infection of skin [L08.9] 08/21/2017  . Encounter for well woman exam with routine gynecological exam [P10.258] 01/14/2017  .  Ascites [R18.8] 12/21/2016  . Fatty liver [K76.0] 11/08/2016  . Rectal bleeding [K62.5] 09/07/2016  . Legally blind [H54.8] 09/06/2016  . Hepatomegaly [R16.0] 05/04/2016  . Abnormal LFTs [R94.5] 05/04/2016  . Chronic diarrhea [K52.9] 05/04/2016  . Esophageal dysphagia [R13.10]  05/04/2016  . Anemia [D64.9] 05/04/2016  . Mood disorder ([place]) [F39] 08/12/2015  . Fibroid, uterine [D25.9] 01/24/2015  . Iron deficiency anemia due to chronic blood loss [D50.0] 01/24/2015  . Menorrhagia with irregular cycle [N92.1] 01/24/2015  . Chronic diastolic CHF (congestive heart failure) ([place]) [I50.32] 01/21/2015  . Type 2 diabetes mellitus with ESRD (end-stage renal disease) ([place]) [Z61.09, N18.6] 01/21/2015  . Diabetic neuropathy ([place]) [E11.40] 01/21/2015  . ESRD on dialysis ([place]) [N18.6, Z99.2] 01/21/2015  . Anemia of chronic disease [D63.8] 01/21/2015  . Current smoker [F17.200] 01/21/2015  . Cholecystitis, acute [K81.0] 05/26/2013  . Essential hypertension, benign [I10] 05/25/2013    Past Medical History Past Medical History:  Diagnosis Date  . Anemia of chronic disease   . Asthma   . Blind left eye   . Bronchitis   . Cataract   . Cholecystitis, acute 05/26/2013   Status post cholecystectomy  . Chronic abdominal pain   . Chronic diarrhea   . Depression   . Diabetic foot ulcer ([place]) 03/01/2015  . Diastolic heart failure ([place])   . ESRD on hemodialysis ([place])    Started diaylsis 12/29/15  . Essential hypertension   . Fibroids   . Glaucoma   . History of blood transfusion   . History of pneumonia   . Hyperlipidemia   . Insulin-dependent diabetes mellitus with retinopathy ([place])   . Neuropathy   . Osteomyelitis ([place])    Toe on left foot    Past Surgical History Past Surgical History:  Procedure Laterality Date  . A/V SHUNTOGRAM N/A 10/25/2016   Procedure: A/V Shuntogram - Right Arm;  Surgeon: Waynetta Sandy, MD;  Location: [facility];  Service: Cardiovascular;  Laterality: N/A;  . AV FISTULA PLACEMENT Right 10/17/2015   Procedure: INSERTION OF ARTERIOVENOUS GORE-TEX GRAFT RIGHT UPPER ARM WITH ACUSEAL;  Surgeon: Conrad Morrison, MD;  Location: [facility];  Service: Vascular;  Laterality: Right;  . CATARACT EXTRACTION W/ INTRAOCULAR LENS IMPLANT Bilateral    . CESAREAN SECTION    . CHOLECYSTECTOMY N/A 05/25/2013   Procedure: LAPAROSCOPIC CHOLECYSTECTOMY;  Surgeon: Jamesetta So, MD;  Location: [facility];  Service: General;  Laterality: N/A;  . COLONOSCOPY WITH PROPOFOL N/A 10/02/2016   Procedure: COLONOSCOPY WITH PROPOFOL;  Surgeon: Danie Binder, MD;  [facility];  Service: Endoscopy;  Laterality: N/A;  145 - pt knows to arrive at 11:15 per office  . ESOPHAGOGASTRODUODENOSCOPY (EGD) WITH PROPOFOL N/A 10/02/2016   Procedure: ESOPHAGOGASTRODUODENOSCOPY (EGD) WITH PROPOFOL;  Surgeon: Danie Binder, MD;  [facility];  Service: Endoscopy;  Laterality: N/A;  . EYE SURGERY Bilateral   . PARS PLANA VITRECTOMY Left 11/24/2014   Procedure: PARS PLANA VITRECTOMY WITH 25 GAUGE;  Surgeon: Hurman Horn, MD;  Location: [facility];  Service: Ophthalmology;  Laterality: Left;  . PERIPHERAL VASCULAR CATHETERIZATION N/A 04/28/2015   Procedure: Bilateral Upper Extremity Venography;  Surgeon: Conrad Harrisburg, MD;  Location: [facility];  Service: Cardiovascular;  Laterality: N/A;  . PHOTOCOAGULATION WITH LASER Left 11/24/2014   Procedure: PHOTOCOAGULATION WITH LASER;  Surgeon: Hurman Horn, MD;  Location: [facility];  Service: Ophthalmology;  Laterality: Left;  with insertion of silicone oil  . SAVORY DILATION  N/A 10/02/2016   Procedure: SAVORY DILATION;  Surgeon: Danie Binder, MD;  [facility];  Service: Endoscopy;  Laterality: N/A;  . TUBAL LIGATION      Family History Family History  Problem Relation Age of Onset  . COPD Mother   . Cancer Father   . Lymphoma Father   . Diabetes Sister   . Deep vein thrombosis Sister   . Diabetes Brother   . Hyperlipidemia Brother   . Hypertension Brother   . Mental retardation Sister   . Alcohol abuse Paternal Grandmother   . Colon cancer Neg Hx   . Liver disease Neg Hx      Social History  reports that she has been smoking cigarettes.  She has a 34.50 pack-year smoking history. She has  never used smokeless tobacco. She reports that she does not drink alcohol or use drugs.  Medications  Current Outpatient Medications:  .  ANORO ELLIPTA 62.5-25 MCG/INH AEPB, INHALE ONE PUFF EVERY DAY., Disp: , Rfl: 12 .  aspirin 81 MG tablet, Take 81 mg by mouth daily., Disp: , Rfl:  .  brimonidine-timolol (COMBIGAN) 0.2-0.5 % ophthalmic solution, Place 1 drop into both eyes every 12 (twelve) hours., Disp: , Rfl:  .  carvedilol (COREG) 12.5 MG tablet, TAKE ONE TABLET BY MOUTH TWICE DAILY, Disp: 60 tablet, Rfl: 2 .  cholecalciferol (VITAMIN D) 1000 units tablet, Take 1,000 Units by mouth daily. , Disp: , Rfl:  .  citalopram (CELEXA) 20 MG tablet, TAKE ONE TABLET BY MOUTH DAILY., Disp: 30 tablet, Rfl: 0 .  Continuous Blood Gluc Sensor (FREESTYLE LIBRE 14 DAY SENSOR) MISC, Inject 1 each into the skin every 14 (fourteen) days. Use as directed., Disp: 2 each, Rfl: 2 .  gabapentin (NEURONTIN) 100 MG capsule, TAKE ONE CAPSULE BY MOUTH THREE TIMES DAILY, Disp: 90 capsule, Rfl: 2 .  glucose blood (PRODIGY NO CODING BLOOD GLUC) test strip, USE TO CHECK BLOOD SUGAR TWICE DAILY., Disp: 200 each, Rfl: 3 .  hydrALAZINE (APRESOLINE) 25 MG tablet, TAKE ONE TABLET BY MOUTH THREE TIMES DAILY. (Patient taking differently: TAKE 25MG BY MOUTH TWICE DAILY), Disp: 90 tablet, Rfl: 0 .  Insulin Glargine (LANTUS SOLOSTAR) 100 UNIT/ML Solostar Pen, Inject 14 Units into the skin at bedtime., Disp: 5 pen, Rfl: 2 .  Insulin Pen Needle (PEN NEEDLES) 31G X 5 MM MISC, 1 Device by Does not apply route daily., Disp: 100 each, Rfl: PRN .  linagliptin (TRADJENTA) 5 MG TABS tablet, Take 1 tablet (5 mg total) by mouth daily., Disp: 30 tablet, Rfl: 3 .  pantoprazole (PROTONIX) 40 MG tablet, TAKE 1 BY MOUTH DAILY 30 minutes before breakfast, Disp: 90 tablet, Rfl: 3 .  PRODIGY TWIST TOP LANCETS 28G MISC, USE TO CHECK BLOOD SUGAR UP TO FOUR TIMES DAILY., Disp: 100 each, Rfl: 1 .  sevelamer carbonate (RENVELA) 800 MG tablet, Take 2,400 mg  by mouth 3 (three) times daily with meals. And with snacks., Disp: , Rfl:  .  traMADol (ULTRAM) 50 MG tablet, Take 1 tablet by mouth 3 (three) times daily., Disp: , Rfl:  .  traZODone (DESYREL) 50 MG tablet, TAKE 1/2 TO 1 TABLET BY MOUTH AT BEDTIME AS NEEDED FOR SLEEP, Disp: 30 tablet, Rfl: 3  Allergies Bactrim [sulfamethoxazole-trimethoprim] and Prednisone  Review of Systems Review of Systems - Oncology ROS negative other than right arm thrombus   Physical Exam  Vitals Wt Readings from Last 3 Encounters:  02/28/18 151 lb 4.8 oz (68.6 kg)  02/26/18 150 lb 12.8 oz (68.4 kg)  02/14/18 152 lb (68.9 kg)   Temp Readings from Last 3 Encounters:  02/26/18 (!) 97.5 F (36.4 C) (Oral)  02/10/18 97.6 F (36.4 C) (Oral)  02/07/18 98 F (36.7 C)   BP Readings from Last 3 Encounters:  02/28/18 (!) 165/91  02/26/18 113/72  02/14/18 134/79   Pulse Readings from Last 3 Encounters:  02/28/18 70  02/26/18 69  02/14/18 62   Constitutional: Well-developed, well-nourished, and in no distress.   HENT: Head: Normocephalic and atraumatic.  Mouth/Throat: No oropharyngeal exudate. Mucosa moist. Eyes: Pupils are equal, round, and reactive to light. Conjunctivae are normal. No scleral icterus.  Neck: Normal range of motion. Neck supple. No JVD present.  Cardiovascular: Normal rate, regular rhythm and normal heart sounds.  Exam reveals no gallop and no friction rub.   No murmur heard. Pulmonary/Chest: Effort normal and breath sounds normal. No respiratory distress. No wheezes.No rales.  Abdominal: Soft. Bowel sounds are normal. No distension. There is no tenderness. There is no guarding.  Musculoskeletal: Right arm graft noted.   Lymphadenopathy: No cervical, axillary or supraclavicular adenopathy.  Neurological: Alert and oriented to person, place, and time. No cranial nerve deficit.  Skin: Skin is warm and dry. No rash noted. No erythema. No pallor.  Psychiatric: Affect and judgment normal.    Labs Appointment on 02/28/2018  Component Date Value Ref Range Status  . WBC 02/28/2018 8.1  4.0 - 10.5 K/uL Final  . RBC 02/28/2018 3.45* 3.87 - 5.11 MIL/uL Final  . Hemoglobin 02/28/2018 11.7* 12.0 - 15.0 g/dL Final  . HCT 02/28/2018 33.9* 36.0 - 46.0 % Final  . MCV 02/28/2018 98.3  78.0 - 100.0 fL Final  . MCH 02/28/2018 33.9  26.0 - 34.0 pg Final  . MCHC 02/28/2018 34.5  30.0 - 36.0 g/dL Final  . RDW 02/28/2018 13.2  11.5 - 15.5 % Final  . Platelets 02/28/2018 213  150 - 400 K/uL Final  . Neutrophils Relative % 02/28/2018 75  % Final  . Neutro Abs 02/28/2018 6.1  1.7 - 7.7 K/uL Final  . Lymphocytes Relative 02/28/2018 14  % Final  . Lymphs Abs 02/28/2018 1.1  0.7 - 4.0 K/uL Final  . Monocytes Relative 02/28/2018 9  % Final  . Monocytes Absolute 02/28/2018 0.7  0.1 - 1.0 K/uL Final  . Eosinophils Relative 02/28/2018 2  % Final  . Eosinophils Absolute 02/28/2018 0.1  0.0 - 0.7 K/uL Final  . Basophils Relative 02/28/2018 0  % Final  . Basophils Absolute 02/28/2018 0.0  0.0 - 0.1 K/uL Final   Performed at [facility], [address] [place], [place] [address]  . Sodium 02/28/2018 134* 135 - 145 mmol/L Final  . Potassium 02/28/2018 3.5  3.5 - 5.1 mmol/L Final  . Chloride 02/28/2018 94* 98 - 111 mmol/L Final  . CO2 02/28/2018 28  22 - 32 mmol/L Final  . Glucose, Bld 02/28/2018 140* 70 - 99 mg/dL Final  . BUN 02/28/2018 30* 6 - 20 mg/dL Final  . Creatinine, Ser 02/28/2018 4.94* 0.44 - 1.00 mg/dL Final  . Calcium 02/28/2018 9.2  8.9 - 10.3 mg/dL Final  . Total Protein 02/28/2018 8.0  6.5 - 8.1 g/dL Final  . Albumin 02/28/2018 4.0  3.5 - 5.0 g/dL Final  . AST 02/28/2018 39  15 - 41 U/L Final  . ALT 02/28/2018 44  0 - 44 U/L Final  . Alkaline Phosphatase 02/28/2018 318* 38 - 126 U/L Final  .  Total Bilirubin 02/28/2018 0.6  0.3 - 1.2 mg/dL Final  . GFR calc non Af Amer 02/28/2018 10* >60 mL/min Final  . GFR calc Af Amer 02/28/2018 11* >60 mL/min Final   Comment: (NOTE) The eGFR  has been calculated using the CKD EPI equation. This calculation has not been validated in all clinical situations. eGFR's persistently <60 mL/min signify possible Chronic Kidney Disease.   Georgiann Hahn gap 02/28/2018 12  5 - 15 Final   Performed at [facility], [address] [place], [place] [address]  . LDH 02/28/2018 160  98 - 192 U/L Final   Performed at [facility], [address], [place], [place] [address]  . Ferritin 02/28/2018 901* 11 - 307 ng/mL Final   Performed at [facility], [place] [address], [place], [place] [address]  . Iron 02/28/2018 102  28 - 170 ug/dL Final  . TIBC 02/28/2018 395  250 - 450 ug/dL Final  . Saturation Ratios 02/28/2018 26  10.4 - 31.8 % Final  . UIBC 02/28/2018 293  ug/dL Final   Performed at [facility], [place] [address] [place],  [address]     Pathology Orders Placed This Encounter  Procedures  . CT Chest Wo Contrast    fyi per can centr dr wants mammo done same day as ct /due to trans reasons ok per mgr may can do ct after mammo/kr 8/2/191001 am    Standing Status:   Future    Standing Expiration Date:   02/28/2019    Order Specific Question:   ** REASON FOR EXAM (FREE TEXT)    Answer:   sOB    Order Specific Question:   Is patient pregnant?    Answer:   No    Order Specific Question:   Preferred imaging location?    Answer:   [facility]    Order Specific Question:   Radiology Contrast Protocol - do NOT remove file path    Answer:   [email or web address]  . MM Digital Screening    Standing Status:   Future    Standing Expiration Date:   02/28/2019    Order Specific Question:   Reason for Exam (SYMPTOM  OR DIAGNOSIS REQUIRED)    Answer:   screeening    Order Specific Question:   Is the patient pregnant?    Answer:   No    Order Specific Question:   Preferred imaging location?    Answer:   [facility]  . CBC with Differential/Platelet    Standing Status:   Future    Number of Occurrences:    1    Standing Expiration Date:   03/01/2019  . Comprehensive metabolic panel    Standing Status:   Future    Number of Occurrences:   1    Standing Expiration Date:   03/01/2019  . Lactate dehydrogenase    Standing Status:   Future    Number of Occurrences:   1    Standing Expiration Date:   03/01/2019  . Ferritin    Standing Status:   Future    Number of Occurrences:   1    Standing Expiration Date:   03/01/2019  . Iron and TIBC    Standing Status:   Future    Number of Occurrences:   1    Standing Expiration Date:   03/01/2019  . Transferrin Saturation    Standing Status:   Future    Number of Occurrences:  1    Standing Expiration Date:   03/01/2019  . Hemochromatosis DNA-PCR(c282y,h63d)    Standing Status:   Future    Number of Occurrences:   1    Standing Expiration Date:   03/01/2019  . Erythropoietin    Standing Status:   Future    Number of Occurrences:   1    Standing Expiration Date:   03/01/2019       Zoila Shutter MD

## 2018-03-01 ENCOUNTER — Other Ambulatory Visit: Payer: Self-pay | Admitting: Family Medicine

## 2018-03-01 DIAGNOSIS — Z992 Dependence on renal dialysis: Secondary | ICD-10-CM | POA: Diagnosis not present

## 2018-03-01 DIAGNOSIS — N250 Renal osteodystrophy: Secondary | ICD-10-CM | POA: Diagnosis not present

## 2018-03-01 DIAGNOSIS — D631 Anemia in chronic kidney disease: Secondary | ICD-10-CM | POA: Diagnosis not present

## 2018-03-01 DIAGNOSIS — E8779 Other fluid overload: Secondary | ICD-10-CM | POA: Diagnosis not present

## 2018-03-01 DIAGNOSIS — N186 End stage renal disease: Secondary | ICD-10-CM | POA: Diagnosis not present

## 2018-03-01 DIAGNOSIS — D509 Iron deficiency anemia, unspecified: Secondary | ICD-10-CM | POA: Diagnosis not present

## 2018-03-02 LAB — ERYTHROPOIETIN: Erythropoietin: 14 m[IU]/mL (ref 2.6–18.5)

## 2018-03-03 ENCOUNTER — Other Ambulatory Visit: Payer: Self-pay | Admitting: Family Medicine

## 2018-03-04 DIAGNOSIS — E8779 Other fluid overload: Secondary | ICD-10-CM | POA: Diagnosis not present

## 2018-03-04 DIAGNOSIS — D509 Iron deficiency anemia, unspecified: Secondary | ICD-10-CM | POA: Diagnosis not present

## 2018-03-04 DIAGNOSIS — N250 Renal osteodystrophy: Secondary | ICD-10-CM | POA: Diagnosis not present

## 2018-03-04 DIAGNOSIS — D631 Anemia in chronic kidney disease: Secondary | ICD-10-CM | POA: Diagnosis not present

## 2018-03-04 DIAGNOSIS — Z992 Dependence on renal dialysis: Secondary | ICD-10-CM | POA: Diagnosis not present

## 2018-03-04 DIAGNOSIS — N186 End stage renal disease: Secondary | ICD-10-CM | POA: Diagnosis not present

## 2018-03-05 ENCOUNTER — Ambulatory Visit (HOSPITAL_COMMUNITY)
Admission: RE | Disposition: A | Discharge status: Home / Self Care | Payer: Self-pay | Source: Ambulatory Visit | Attending: Vascular Surgery

## 2018-03-05 ENCOUNTER — Ambulatory Visit (HOSPITAL_COMMUNITY)
Admission: RE | Admit: 2018-03-05 | Discharge: 2018-03-05 | Disposition: A | Discharge status: Home / Self Care | Payer: Medicare Other | Source: Ambulatory Visit | Attending: Vascular Surgery | Admitting: Vascular Surgery

## 2018-03-05 ENCOUNTER — Encounter (HOSPITAL_COMMUNITY): Payer: Self-pay

## 2018-03-05 DIAGNOSIS — H409 Unspecified glaucoma: Secondary | ICD-10-CM | POA: Insufficient documentation

## 2018-03-05 DIAGNOSIS — Z7951 Long term (current) use of inhaled steroids: Secondary | ICD-10-CM | POA: Insufficient documentation

## 2018-03-05 DIAGNOSIS — Z9841 Cataract extraction status, right eye: Secondary | ICD-10-CM | POA: Diagnosis not present

## 2018-03-05 DIAGNOSIS — Z9851 Tubal ligation status: Secondary | ICD-10-CM | POA: Diagnosis not present

## 2018-03-05 DIAGNOSIS — Z794 Long term (current) use of insulin: Secondary | ICD-10-CM | POA: Insufficient documentation

## 2018-03-05 DIAGNOSIS — Z7982 Long term (current) use of aspirin: Secondary | ICD-10-CM | POA: Diagnosis not present

## 2018-03-05 DIAGNOSIS — J45909 Unspecified asthma, uncomplicated: Secondary | ICD-10-CM | POA: Insufficient documentation

## 2018-03-05 DIAGNOSIS — I132 Hypertensive heart and chronic kidney disease with heart failure and with stage 5 chronic kidney disease, or end stage renal disease: Secondary | ICD-10-CM | POA: Insufficient documentation

## 2018-03-05 DIAGNOSIS — Y832 Surgical operation with anastomosis, bypass or graft as the cause of abnormal reaction of the patient, or of later complication, without mention of misadventure at the time of the procedure: Secondary | ICD-10-CM | POA: Insufficient documentation

## 2018-03-05 DIAGNOSIS — E11319 Type 2 diabetes mellitus with unspecified diabetic retinopathy without macular edema: Secondary | ICD-10-CM | POA: Diagnosis not present

## 2018-03-05 DIAGNOSIS — E1122 Type 2 diabetes mellitus with diabetic chronic kidney disease: Secondary | ICD-10-CM | POA: Insufficient documentation

## 2018-03-05 DIAGNOSIS — T82858A Stenosis of vascular prosthetic devices, implants and grafts, initial encounter: Secondary | ICD-10-CM | POA: Insufficient documentation

## 2018-03-05 DIAGNOSIS — F329 Major depressive disorder, single episode, unspecified: Secondary | ICD-10-CM | POA: Insufficient documentation

## 2018-03-05 DIAGNOSIS — Z992 Dependence on renal dialysis: Secondary | ICD-10-CM | POA: Insufficient documentation

## 2018-03-05 DIAGNOSIS — N186 End stage renal disease: Secondary | ICD-10-CM | POA: Diagnosis not present

## 2018-03-05 DIAGNOSIS — F17210 Nicotine dependence, cigarettes, uncomplicated: Secondary | ICD-10-CM | POA: Diagnosis not present

## 2018-03-05 DIAGNOSIS — Z888 Allergy status to other drugs, medicaments and biological substances status: Secondary | ICD-10-CM | POA: Diagnosis not present

## 2018-03-05 DIAGNOSIS — Z9049 Acquired absence of other specified parts of digestive tract: Secondary | ICD-10-CM | POA: Insufficient documentation

## 2018-03-05 DIAGNOSIS — Z9889 Other specified postprocedural states: Secondary | ICD-10-CM | POA: Diagnosis not present

## 2018-03-05 DIAGNOSIS — E1140 Type 2 diabetes mellitus with diabetic neuropathy, unspecified: Secondary | ICD-10-CM | POA: Diagnosis not present

## 2018-03-05 DIAGNOSIS — I5030 Unspecified diastolic (congestive) heart failure: Secondary | ICD-10-CM | POA: Insufficient documentation

## 2018-03-05 DIAGNOSIS — Z836 Family history of other diseases of the respiratory system: Secondary | ICD-10-CM | POA: Insufficient documentation

## 2018-03-05 DIAGNOSIS — Z8249 Family history of ischemic heart disease and other diseases of the circulatory system: Secondary | ICD-10-CM | POA: Insufficient documentation

## 2018-03-05 DIAGNOSIS — Z833 Family history of diabetes mellitus: Secondary | ICD-10-CM | POA: Insufficient documentation

## 2018-03-05 DIAGNOSIS — Z79899 Other long term (current) drug therapy: Secondary | ICD-10-CM | POA: Diagnosis not present

## 2018-03-05 DIAGNOSIS — Z881 Allergy status to other antibiotic agents status: Secondary | ICD-10-CM | POA: Diagnosis not present

## 2018-03-05 DIAGNOSIS — Z9842 Cataract extraction status, left eye: Secondary | ICD-10-CM | POA: Insufficient documentation

## 2018-03-05 DIAGNOSIS — E785 Hyperlipidemia, unspecified: Secondary | ICD-10-CM | POA: Diagnosis not present

## 2018-03-05 DIAGNOSIS — T82898A Other specified complication of vascular prosthetic devices, implants and grafts, initial encounter: Secondary | ICD-10-CM | POA: Diagnosis not present

## 2018-03-05 HISTORY — PX: PERIPHERAL VASCULAR BALLOON ANGIOPLASTY: CATH118281

## 2018-03-05 HISTORY — PX: A/V SHUNTOGRAM: CATH118297

## 2018-03-05 LAB — POCT I-STAT, CHEM 8
BUN: 29 mg/dL — AB (ref 6–20)
CREATININE: 5 mg/dL — AB (ref 0.44–1.00)
Calcium, Ion: 1.06 mmol/L — ABNORMAL LOW (ref 1.15–1.40)
Chloride: 93 mmol/L — ABNORMAL LOW (ref 98–111)
GLUCOSE: 123 mg/dL — AB (ref 70–99)
HCT: 34 % — ABNORMAL LOW (ref 36.0–46.0)
HEMOGLOBIN: 11.6 g/dL — AB (ref 12.0–15.0)
POTASSIUM: 3.3 mmol/L — AB (ref 3.5–5.1)
Sodium: 136 mmol/L (ref 135–145)
TCO2: 29 mmol/L (ref 22–32)

## 2018-03-05 LAB — HEMOCHROMATOSIS DNA-PCR(C282Y,H63D)

## 2018-03-05 LAB — GLUCOSE, CAPILLARY: GLUCOSE-CAPILLARY: 94 mg/dL (ref 70–99)

## 2018-03-05 SURGERY — A/V SHUNTOGRAM
Anesthesia: LOCAL | Laterality: Right

## 2018-03-05 MED ORDER — HYDRALAZINE HCL 20 MG/ML IJ SOLN: 5.0000 mg | INTRAMUSCULAR | Status: DC | PRN

## 2018-03-05 MED ORDER — VERAPAMIL HCL 2.5 MG/ML IV SOLN
INTRAVENOUS | Status: AC
  Filled 2018-03-05: qty 2

## 2018-03-05 MED ORDER — LABETALOL HCL 5 MG/ML IV SOLN: 10.0000 mg | INTRAVENOUS | Status: DC | PRN

## 2018-03-05 MED ORDER — FENTANYL CITRATE (PF) 100 MCG/2ML IJ SOLN
INTRAMUSCULAR | Status: AC
  Filled 2018-03-05: qty 2

## 2018-03-05 MED ORDER — FENTANYL CITRATE (PF) 100 MCG/2ML IJ SOLN
INTRAMUSCULAR | Status: DC | PRN
  Administered 2018-03-05: 50 ug via INTRAVENOUS

## 2018-03-05 MED ORDER — SODIUM CHLORIDE 0.9% FLUSH: 3.0000 mL | INTRAVENOUS | Status: DC | PRN

## 2018-03-05 MED ORDER — LIDOCAINE HCL (PF) 1 % IJ SOLN
INTRAMUSCULAR | Status: AC
  Filled 2018-03-05: qty 30

## 2018-03-05 MED ORDER — HEPARIN SODIUM (PORCINE) 1000 UNIT/ML IJ SOLN
INTRAMUSCULAR | Status: DC | PRN
  Administered 2018-03-05: 3000 [IU] via INTRAVENOUS

## 2018-03-05 MED ORDER — ONDANSETRON HCL 4 MG/2ML IJ SOLN: 4.0000 mg | Freq: Four times a day (QID) | INTRAMUSCULAR | Status: DC | PRN

## 2018-03-05 MED ORDER — SODIUM CHLORIDE 0.9% FLUSH: 3.0000 mL | Freq: Two times a day (BID) | INTRAVENOUS | Status: DC

## 2018-03-05 MED ORDER — HEPARIN SODIUM (PORCINE) 1000 UNIT/ML IJ SOLN
INTRAMUSCULAR | Status: AC
  Filled 2018-03-05: qty 1

## 2018-03-05 MED ORDER — HEPARIN (PORCINE) IN NACL 1000-0.9 UT/500ML-% IV SOLN
INTRAVENOUS | Status: AC
  Filled 2018-03-05: qty 500

## 2018-03-05 MED ORDER — SODIUM CHLORIDE 0.9 % IV SOLN: 250.0000 mL | INTRAVENOUS | Status: DC | PRN

## 2018-03-05 MED ORDER — VERAPAMIL HCL 2.5 MG/ML IV SOLN
INTRAVENOUS | Status: DC | PRN
  Administered 2018-03-05: 5 mL via INTRA_ARTERIAL

## 2018-03-05 MED ORDER — MIDAZOLAM HCL 2 MG/2ML IJ SOLN
INTRAMUSCULAR | Status: AC
  Filled 2018-03-05: qty 2

## 2018-03-05 MED ORDER — MIDAZOLAM HCL 2 MG/2ML IJ SOLN
INTRAMUSCULAR | Status: DC | PRN
  Administered 2018-03-05: 1 mg via INTRAVENOUS

## 2018-03-05 MED ORDER — ACETAMINOPHEN 325 MG PO TABS: 650.0000 mg | ORAL_TABLET | ORAL | Status: DC | PRN

## 2018-03-05 MED ORDER — HEPARIN (PORCINE) IN NACL 1000-0.9 UT/500ML-% IV SOLN
INTRAVENOUS | Status: DC | PRN
  Administered 2018-03-05: 500 mL

## 2018-03-05 SURGICAL SUPPLY — 17 items
BAG SNAP BAND KOVER 36X36 (MISCELLANEOUS) ×3 IMPLANT
BALLN LUTONIX AV 7X60X75 (BALLOONS) ×3
BALLN MUSTANG 6.0X40 75 (BALLOONS) ×3
BALLOON LUTONIX AV 7X60X75 (BALLOONS) ×2 IMPLANT
BALLOON MUSTANG 6.0X40 75 (BALLOONS) ×2 IMPLANT
CATH BEACON 5 .035 65 KMP TIP (CATHETERS) ×3 IMPLANT
COVER DOME SNAP 22 D (MISCELLANEOUS) ×3 IMPLANT
GLIDESHEATH SLEND SS 6F .021 (SHEATH) ×3 IMPLANT
GUIDEWIRE ANGLED .035X150CM (WIRE) ×3 IMPLANT
KIT ENCORE 26 ADVANTAGE (KITS) ×3 IMPLANT
KIT MICROPUNCTURE NIT STIFF (SHEATH) ×3 IMPLANT
PROTECTION STATION PRESSURIZED (MISCELLANEOUS) ×3
SHEATH PROBE COVER 6X72 (BAG) ×3 IMPLANT
STATION PROTECTION PRESSURIZED (MISCELLANEOUS) ×2 IMPLANT
STOPCOCK MORSE 400PSI 3WAY (MISCELLANEOUS) ×3 IMPLANT
TRAY PV CATH (CUSTOM PROCEDURE TRAY) ×3 IMPLANT
TUBING CIL FLEX 10 FLL-RA (TUBING) ×3 IMPLANT

## 2018-03-05 NOTE — H&P (Addendum)
   History and Physical Update  The patient was interviewed and re-examined.  The patient's previous History and Physical has been reviewed and is unchanged from previous office visit.  She has a thrill high in the graft but does not proximally.  We will plan right upper extremity fistulogram with possible intervention today.    Samhita Kretsch C. Donzetta Matters, MD [facility] Office: [phone] Pager: [phone]  03/05/2018, 10:43 AM

## 2018-03-05 NOTE — Discharge Instructions (Signed)

## 2018-03-05 NOTE — Op Note (Signed)
    Patient name: Kara Knapp MRN: [id number] DOB: October 15, 1970 Sex: female  03/05/2018 Pre-operative Diagnosis: End-stage renal disease, malfunction right arm AV fistula Post-operative diagnosis:  Same Surgeon:  Eda Paschal. Donzetta Matters, MD Co-surgeon: Fortunato Curling, MD Procedure Performed: 1.  Ultrasound-guided cannulation right radial artery 2.  Right upper extremity fistulogram 3.  Drug-coated balloon angioplasty of venous anastomosis with 7 mm lutonix 4.  Plain balloon angioplasty of graft with 7 mm balloon 5.  Moderate sedation fentanyl and Versed for 32 minutes   Indications: 47 year old female history of end-stage renal disease multiple interventions right upper extremity AV graft.  She is now indicated for further access procedures given that she has had some malfunction.  Findings: There are 2 areas of stenosis throughout the graft and then there is a venous anastomotic stenosis.  At completion these have been reduced to less than 20%.  There is improved thrill in the graft.   Procedure:  The patient was identified in the holding area and taken to room 8.  The patient was then placed supine on the table and prepped and draped in the usual sterile fashion.  A time out was called.  Ultrasound was used to evaluate the right radial artery which was noted to be patent and compressible.  This was cannulated directly with micropuncture needle and a 6 French radial artery sheath was placed.  An image was saved the permanent record.  We used BER catheter Bentson wire performed right upper extremity fistulogram.  With the above findings we elected to intervene.  3000 of heparin was given.  We cross the stenoses first dilated with 6 mm balloon.  We then used a 7 mm drug-coated balloon at the venous anastomosis and also use this throughout the graft.  Completion angiography demonstrated were previously greater than 60% stenosis we are now less than 20%.  There is an improved thrill in the graft.  The sheath  was pulled and TR band placed.  She tolerated procedure well without immediate comp occasion.  Next  Contrast 35 cc   [name] C. Donzetta Matters, MD [facility] Office: [phone] Pager: [phone]

## 2018-03-06 ENCOUNTER — Encounter (HOSPITAL_COMMUNITY): Payer: Self-pay | Admitting: Vascular Surgery

## 2018-03-06 DIAGNOSIS — N250 Renal osteodystrophy: Secondary | ICD-10-CM | POA: Diagnosis not present

## 2018-03-06 DIAGNOSIS — N186 End stage renal disease: Secondary | ICD-10-CM | POA: Diagnosis not present

## 2018-03-06 DIAGNOSIS — D509 Iron deficiency anemia, unspecified: Secondary | ICD-10-CM | POA: Diagnosis not present

## 2018-03-06 DIAGNOSIS — Z992 Dependence on renal dialysis: Secondary | ICD-10-CM | POA: Diagnosis not present

## 2018-03-06 DIAGNOSIS — D631 Anemia in chronic kidney disease: Secondary | ICD-10-CM | POA: Diagnosis not present

## 2018-03-06 DIAGNOSIS — E8779 Other fluid overload: Secondary | ICD-10-CM | POA: Diagnosis not present

## 2018-03-06 IMAGING — CR DG ABDOMEN ACUTE W/ 1V CHEST
4 series · 4 of 4 positions shown · non-contrast
Comparison: Two-view chest x-ray 12/13/2016. CT of the abdomen and
pelvis 12/13/2016.

CLINICAL DATA: Abdominal pain and diarrhea.

EXAM:
DG ABDOMEN ACUTE W/ 1V CHEST

[w chest pa]
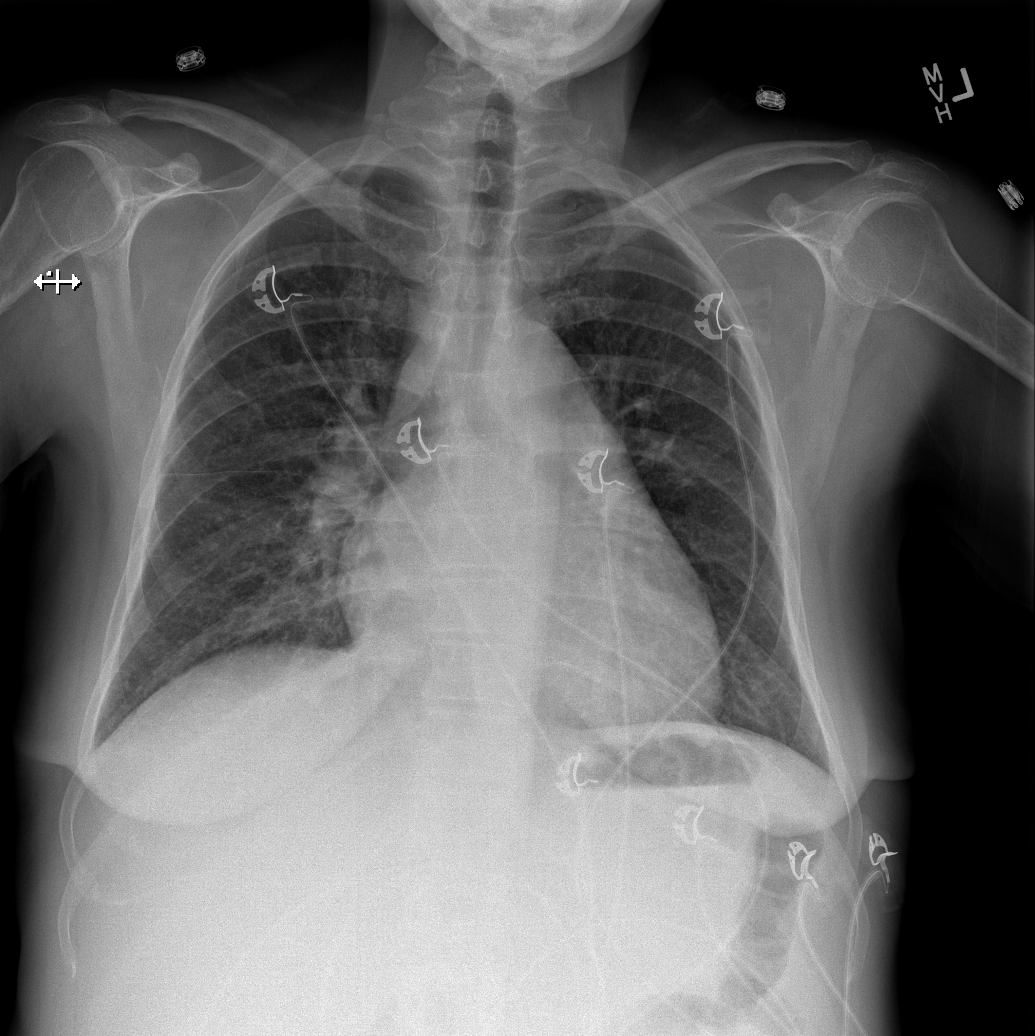

[w abdomen upright]
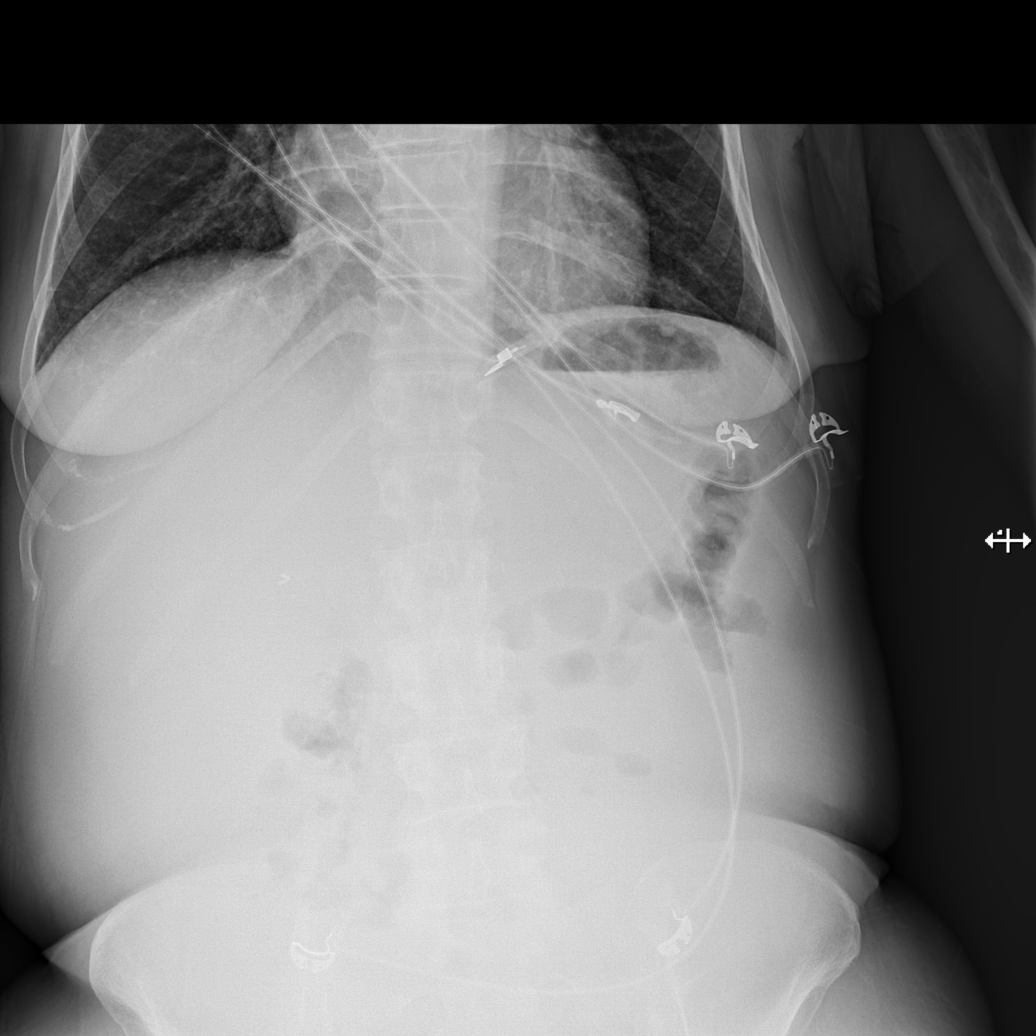

[t abdomen supine (1 of 2)]
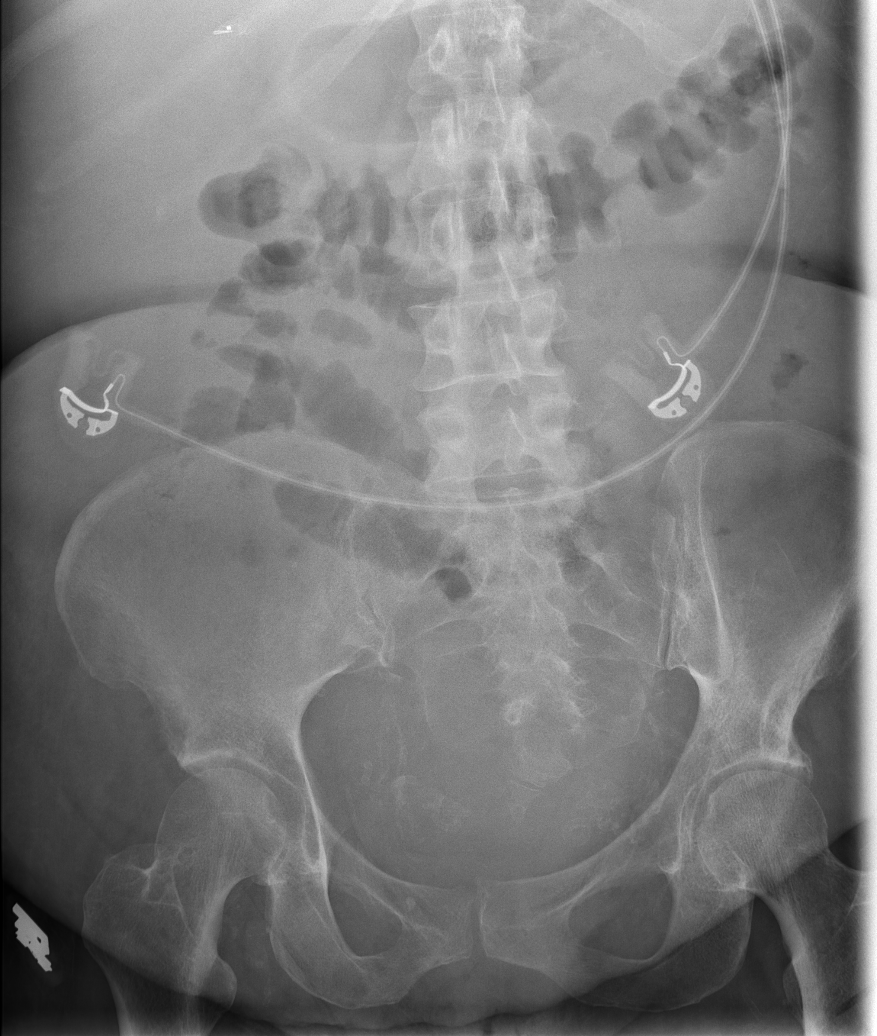

[t abdomen supine (2 of 2)]
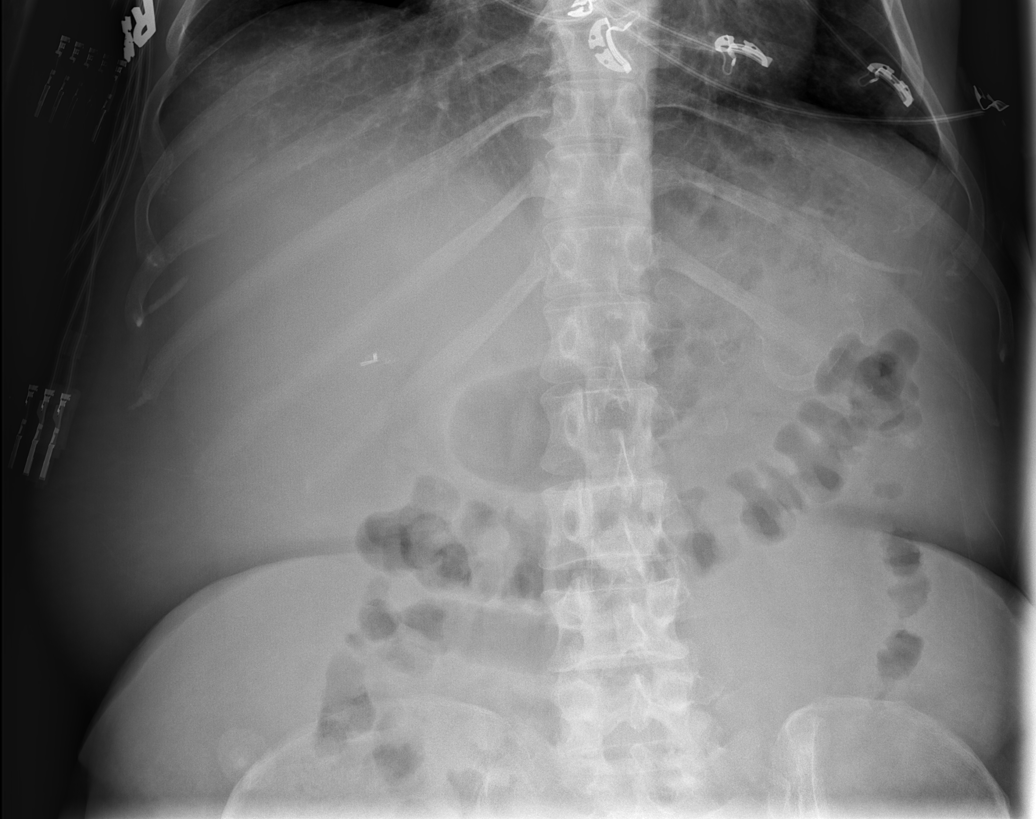

[4 of 4 positions shown; findings below may reference images not displayed]

FINDINGS: Heart is enlarged. There is no edema or effusion to suggest failure.
No focal airspace disease is present.

The bowel gas pattern is normal. There is no obstruction or free
air. Surgical clips are noted at the gallbladder fossa.
IMPRESSION: 1. Cardiomegaly without failure.
2. Negative abdominal radiographs.

## 2018-03-06 MED FILL — Lidocaine HCl Local Preservative Free (PF) Inj 1%: INTRAMUSCULAR | Qty: 30 | Status: AC

## 2018-03-08 DIAGNOSIS — Z992 Dependence on renal dialysis: Secondary | ICD-10-CM | POA: Diagnosis not present

## 2018-03-08 DIAGNOSIS — N186 End stage renal disease: Secondary | ICD-10-CM | POA: Diagnosis not present

## 2018-03-08 DIAGNOSIS — D509 Iron deficiency anemia, unspecified: Secondary | ICD-10-CM | POA: Diagnosis not present

## 2018-03-08 DIAGNOSIS — N250 Renal osteodystrophy: Secondary | ICD-10-CM | POA: Diagnosis not present

## 2018-03-08 DIAGNOSIS — D631 Anemia in chronic kidney disease: Secondary | ICD-10-CM | POA: Diagnosis not present

## 2018-03-08 DIAGNOSIS — E8779 Other fluid overload: Secondary | ICD-10-CM | POA: Diagnosis not present

## 2018-03-10 DIAGNOSIS — I5032 Chronic diastolic (congestive) heart failure: Secondary | ICD-10-CM | POA: Diagnosis not present

## 2018-03-10 DIAGNOSIS — N186 End stage renal disease: Secondary | ICD-10-CM | POA: Diagnosis not present

## 2018-03-10 DIAGNOSIS — I10 Essential (primary) hypertension: Secondary | ICD-10-CM | POA: Diagnosis not present

## 2018-03-10 DIAGNOSIS — J449 Chronic obstructive pulmonary disease, unspecified: Secondary | ICD-10-CM | POA: Diagnosis not present

## 2018-03-11 ENCOUNTER — Other Ambulatory Visit: Payer: Self-pay | Admitting: *Deleted

## 2018-03-11 ENCOUNTER — Ambulatory Visit (HOSPITAL_COMMUNITY): Payer: Self-pay

## 2018-03-11 DIAGNOSIS — Z992 Dependence on renal dialysis: Secondary | ICD-10-CM | POA: Diagnosis not present

## 2018-03-11 DIAGNOSIS — E8779 Other fluid overload: Secondary | ICD-10-CM | POA: Diagnosis not present

## 2018-03-11 DIAGNOSIS — N250 Renal osteodystrophy: Secondary | ICD-10-CM | POA: Diagnosis not present

## 2018-03-11 DIAGNOSIS — D631 Anemia in chronic kidney disease: Secondary | ICD-10-CM | POA: Diagnosis not present

## 2018-03-11 DIAGNOSIS — D509 Iron deficiency anemia, unspecified: Secondary | ICD-10-CM | POA: Diagnosis not present

## 2018-03-11 DIAGNOSIS — N186 End stage renal disease: Secondary | ICD-10-CM | POA: Diagnosis not present

## 2018-03-11 MED ORDER — TRAZODONE HCL 50 MG PO TABS: 25.0000 mg | ORAL_TABLET | Freq: Every evening | ORAL | 3 refills | Status: DC | PRN

## 2018-03-11 MED ORDER — CITALOPRAM HYDROBROMIDE 20 MG PO TABS: 20.0000 mg | ORAL_TABLET | Freq: Every day | ORAL | 2 refills | Status: DC

## 2018-03-12 DIAGNOSIS — H211X1 Other vascular disorders of iris and ciliary body, right eye: Secondary | ICD-10-CM | POA: Diagnosis not present

## 2018-03-12 DIAGNOSIS — E113511 Type 2 diabetes mellitus with proliferative diabetic retinopathy with macular edema, right eye: Secondary | ICD-10-CM | POA: Diagnosis not present

## 2018-03-12 DIAGNOSIS — H4051X3 Glaucoma secondary to other eye disorders, right eye, severe stage: Secondary | ICD-10-CM | POA: Diagnosis not present

## 2018-03-14 ENCOUNTER — Ambulatory Visit (HOSPITAL_COMMUNITY): Payer: Medicare Other

## 2018-03-14 ENCOUNTER — Ambulatory Visit (HOSPITAL_COMMUNITY): Payer: Self-pay

## 2018-03-15 DIAGNOSIS — N250 Renal osteodystrophy: Secondary | ICD-10-CM | POA: Diagnosis not present

## 2018-03-15 DIAGNOSIS — Z992 Dependence on renal dialysis: Secondary | ICD-10-CM | POA: Diagnosis not present

## 2018-03-15 DIAGNOSIS — D631 Anemia in chronic kidney disease: Secondary | ICD-10-CM | POA: Diagnosis not present

## 2018-03-15 DIAGNOSIS — N186 End stage renal disease: Secondary | ICD-10-CM | POA: Diagnosis not present

## 2018-03-15 DIAGNOSIS — E8779 Other fluid overload: Secondary | ICD-10-CM | POA: Diagnosis not present

## 2018-03-15 DIAGNOSIS — D509 Iron deficiency anemia, unspecified: Secondary | ICD-10-CM | POA: Diagnosis not present

## 2018-03-17 ENCOUNTER — Ambulatory Visit (HOSPITAL_COMMUNITY): Payer: Self-pay | Admitting: Internal Medicine

## 2018-03-18 DIAGNOSIS — E8779 Other fluid overload: Secondary | ICD-10-CM | POA: Diagnosis not present

## 2018-03-18 DIAGNOSIS — D509 Iron deficiency anemia, unspecified: Secondary | ICD-10-CM | POA: Diagnosis not present

## 2018-03-18 DIAGNOSIS — N250 Renal osteodystrophy: Secondary | ICD-10-CM | POA: Diagnosis not present

## 2018-03-18 DIAGNOSIS — D631 Anemia in chronic kidney disease: Secondary | ICD-10-CM | POA: Diagnosis not present

## 2018-03-18 DIAGNOSIS — N186 End stage renal disease: Secondary | ICD-10-CM | POA: Diagnosis not present

## 2018-03-18 DIAGNOSIS — Z992 Dependence on renal dialysis: Secondary | ICD-10-CM | POA: Diagnosis not present

## 2018-03-19 ENCOUNTER — Ambulatory Visit: Payer: Medicare Other | Admitting: Family Medicine

## 2018-03-20 DIAGNOSIS — D509 Iron deficiency anemia, unspecified: Secondary | ICD-10-CM | POA: Diagnosis not present

## 2018-03-20 DIAGNOSIS — E8779 Other fluid overload: Secondary | ICD-10-CM | POA: Diagnosis not present

## 2018-03-20 DIAGNOSIS — D631 Anemia in chronic kidney disease: Secondary | ICD-10-CM | POA: Diagnosis not present

## 2018-03-20 DIAGNOSIS — Z992 Dependence on renal dialysis: Secondary | ICD-10-CM | POA: Diagnosis not present

## 2018-03-20 DIAGNOSIS — N186 End stage renal disease: Secondary | ICD-10-CM | POA: Diagnosis not present

## 2018-03-20 DIAGNOSIS — N250 Renal osteodystrophy: Secondary | ICD-10-CM | POA: Diagnosis not present

## 2018-03-24 DIAGNOSIS — N186 End stage renal disease: Secondary | ICD-10-CM | POA: Diagnosis not present

## 2018-03-24 DIAGNOSIS — N250 Renal osteodystrophy: Secondary | ICD-10-CM | POA: Diagnosis not present

## 2018-03-24 DIAGNOSIS — D509 Iron deficiency anemia, unspecified: Secondary | ICD-10-CM | POA: Diagnosis not present

## 2018-03-24 DIAGNOSIS — E8779 Other fluid overload: Secondary | ICD-10-CM | POA: Diagnosis not present

## 2018-03-24 DIAGNOSIS — Z992 Dependence on renal dialysis: Secondary | ICD-10-CM | POA: Diagnosis not present

## 2018-03-24 DIAGNOSIS — D631 Anemia in chronic kidney disease: Secondary | ICD-10-CM | POA: Diagnosis not present

## 2018-03-25 DIAGNOSIS — N186 End stage renal disease: Secondary | ICD-10-CM | POA: Diagnosis not present

## 2018-03-25 DIAGNOSIS — D631 Anemia in chronic kidney disease: Secondary | ICD-10-CM | POA: Diagnosis not present

## 2018-03-25 DIAGNOSIS — Z992 Dependence on renal dialysis: Secondary | ICD-10-CM | POA: Diagnosis not present

## 2018-03-25 DIAGNOSIS — E8779 Other fluid overload: Secondary | ICD-10-CM | POA: Diagnosis not present

## 2018-03-25 DIAGNOSIS — N250 Renal osteodystrophy: Secondary | ICD-10-CM | POA: Diagnosis not present

## 2018-03-25 DIAGNOSIS — D509 Iron deficiency anemia, unspecified: Secondary | ICD-10-CM | POA: Diagnosis not present

## 2018-03-26 ENCOUNTER — Ambulatory Visit: Payer: Medicare Other | Admitting: Nurse Practitioner

## 2018-03-26 ENCOUNTER — Ambulatory Visit (HOSPITAL_COMMUNITY): Payer: Medicare Other

## 2018-03-26 ENCOUNTER — Ambulatory Visit (HOSPITAL_COMMUNITY): Payer: Self-pay

## 2018-03-27 DIAGNOSIS — D631 Anemia in chronic kidney disease: Secondary | ICD-10-CM | POA: Diagnosis not present

## 2018-03-27 DIAGNOSIS — E8779 Other fluid overload: Secondary | ICD-10-CM | POA: Diagnosis not present

## 2018-03-27 DIAGNOSIS — Z992 Dependence on renal dialysis: Secondary | ICD-10-CM | POA: Diagnosis not present

## 2018-03-27 DIAGNOSIS — D509 Iron deficiency anemia, unspecified: Secondary | ICD-10-CM | POA: Diagnosis not present

## 2018-03-27 DIAGNOSIS — N250 Renal osteodystrophy: Secondary | ICD-10-CM | POA: Diagnosis not present

## 2018-03-27 DIAGNOSIS — N186 End stage renal disease: Secondary | ICD-10-CM | POA: Diagnosis not present

## 2018-03-28 ENCOUNTER — Ambulatory Visit (HOSPITAL_COMMUNITY): Payer: Self-pay

## 2018-03-28 ENCOUNTER — Ambulatory Visit (HOSPITAL_COMMUNITY): Payer: Medicare Other

## 2018-03-28 DIAGNOSIS — N186 End stage renal disease: Secondary | ICD-10-CM | POA: Diagnosis not present

## 2018-03-28 DIAGNOSIS — Z992 Dependence on renal dialysis: Secondary | ICD-10-CM | POA: Diagnosis not present

## 2018-03-29 DIAGNOSIS — E8779 Other fluid overload: Secondary | ICD-10-CM | POA: Diagnosis not present

## 2018-03-29 DIAGNOSIS — D509 Iron deficiency anemia, unspecified: Secondary | ICD-10-CM | POA: Diagnosis not present

## 2018-03-29 DIAGNOSIS — N186 End stage renal disease: Secondary | ICD-10-CM | POA: Diagnosis not present

## 2018-03-29 DIAGNOSIS — N250 Renal osteodystrophy: Secondary | ICD-10-CM | POA: Diagnosis not present

## 2018-03-29 DIAGNOSIS — D631 Anemia in chronic kidney disease: Secondary | ICD-10-CM | POA: Diagnosis not present

## 2018-03-29 DIAGNOSIS — Z992 Dependence on renal dialysis: Secondary | ICD-10-CM | POA: Diagnosis not present

## 2018-04-01 DIAGNOSIS — D509 Iron deficiency anemia, unspecified: Secondary | ICD-10-CM | POA: Diagnosis not present

## 2018-04-01 DIAGNOSIS — E8779 Other fluid overload: Secondary | ICD-10-CM | POA: Diagnosis not present

## 2018-04-01 DIAGNOSIS — D631 Anemia in chronic kidney disease: Secondary | ICD-10-CM | POA: Diagnosis not present

## 2018-04-01 DIAGNOSIS — Z992 Dependence on renal dialysis: Secondary | ICD-10-CM | POA: Diagnosis not present

## 2018-04-01 DIAGNOSIS — N250 Renal osteodystrophy: Secondary | ICD-10-CM | POA: Diagnosis not present

## 2018-04-01 DIAGNOSIS — N186 End stage renal disease: Secondary | ICD-10-CM | POA: Diagnosis not present

## 2018-04-02 ENCOUNTER — Ambulatory Visit (HOSPITAL_COMMUNITY)
Admission: RE | Admit: 2018-04-02 | Discharge: 2018-04-02 | Disposition: A | Discharge status: Home / Self Care | Payer: Medicare Other | Source: Ambulatory Visit | Attending: Internal Medicine | Admitting: Internal Medicine

## 2018-04-02 ENCOUNTER — Ambulatory Visit (HOSPITAL_COMMUNITY): Payer: Self-pay | Admitting: Internal Medicine

## 2018-04-02 DIAGNOSIS — I700 Atherosclerosis of aorta: Secondary | ICD-10-CM | POA: Insufficient documentation

## 2018-04-02 DIAGNOSIS — Z1231 Encounter for screening mammogram for malignant neoplasm of breast: Secondary | ICD-10-CM | POA: Diagnosis not present

## 2018-04-02 DIAGNOSIS — I2510 Atherosclerotic heart disease of native coronary artery without angina pectoris: Secondary | ICD-10-CM | POA: Diagnosis not present

## 2018-04-02 DIAGNOSIS — Z720 Tobacco use: Secondary | ICD-10-CM

## 2018-04-02 DIAGNOSIS — Z1239 Encounter for other screening for malignant neoplasm of breast: Secondary | ICD-10-CM

## 2018-04-02 DIAGNOSIS — R05 Cough: Secondary | ICD-10-CM | POA: Diagnosis not present

## 2018-04-02 DIAGNOSIS — R079 Chest pain, unspecified: Secondary | ICD-10-CM | POA: Diagnosis not present

## 2018-04-03 DIAGNOSIS — D631 Anemia in chronic kidney disease: Secondary | ICD-10-CM | POA: Diagnosis not present

## 2018-04-03 DIAGNOSIS — N250 Renal osteodystrophy: Secondary | ICD-10-CM | POA: Diagnosis not present

## 2018-04-03 DIAGNOSIS — N186 End stage renal disease: Secondary | ICD-10-CM | POA: Diagnosis not present

## 2018-04-03 DIAGNOSIS — E8779 Other fluid overload: Secondary | ICD-10-CM | POA: Diagnosis not present

## 2018-04-03 DIAGNOSIS — D509 Iron deficiency anemia, unspecified: Secondary | ICD-10-CM | POA: Diagnosis not present

## 2018-04-03 DIAGNOSIS — Z992 Dependence on renal dialysis: Secondary | ICD-10-CM | POA: Diagnosis not present

## 2018-04-04 ENCOUNTER — Inpatient Hospital Stay (HOSPITAL_COMMUNITY): Payer: Medicare Other | Attending: Internal Medicine | Admitting: Internal Medicine

## 2018-04-04 ENCOUNTER — Encounter (HOSPITAL_COMMUNITY): Payer: Self-pay | Admitting: Internal Medicine

## 2018-04-04 VITALS — BP 145/70 | HR 65 | Temp 97.6°F | Resp 14 | Wt 152.5 lb

## 2018-04-04 DIAGNOSIS — Z992 Dependence on renal dialysis: Secondary | ICD-10-CM | POA: Diagnosis not present

## 2018-04-04 DIAGNOSIS — N186 End stage renal disease: Secondary | ICD-10-CM | POA: Diagnosis not present

## 2018-04-04 DIAGNOSIS — R7989 Other specified abnormal findings of blood chemistry: Secondary | ICD-10-CM

## 2018-04-04 NOTE — Patient Instructions (Signed)
[place] at [facility] Discharge Instructions  You saw Dr. Walden Field today.   Thank you for choosing [place] at [facility] to provide your oncology and hematology care.  To afford each patient quality time with our provider, please arrive at least 15 minutes before your scheduled appointment time.   If you have a lab appointment with the [facility] please come in thru the  Main Entrance and check in at the main information desk  You need to re-schedule your appointment should you arrive 10 or more minutes late.  We strive to give you quality time with our providers, and arriving late affects you and other patients whose appointments are after yours.  Also, if you no show three or more times for appointments you may be dismissed from the clinic at the providers discretion.     Again, thank you for choosing [facility].  Our hope is that these requests will decrease the amount of time that you wait before being seen by our physicians.       _____________________________________________________________  Should you have questions after your visit to [facility], please contact our office at [phone] between the hours of 8:00 a.m. and 4:30 p.m.  Voicemails left after 4:00 p.m. will not be returned until the following business day.  For prescription refill requests, have your pharmacy contact our office and allow 72 hours.    Cancer Center Support Programs:   > Cancer Support Group  2nd Tuesday of the month 1pm-2pm, Journey Room

## 2018-04-04 NOTE — Progress Notes (Signed)
Diagnosis Elevated ferritin - Plan: CBC with Differential/Platelet, Comprehensive metabolic panel, Ferritin, Lactate dehydrogenase  Staging Cancer Staging No matching staging information was found for the patient.  Assessment and Plan:  1.  Iron overload/enlarged liver..  Pt is a dialysis patient and reports she was recently diagnosed with a thrombus in her graft.  She underwent a liver biopsy on 02/07/2018 with pathology returning as iron overload.  Findings were suspected to be secondary.  When questioned she reports she has been getting regular iron infusion.  Review of chart shows she had labs done 02/10/2018 that showed a white count of 7.3 hemoglobin 10.4 platelets 187,000.  Chemistries were within normal limits other than a creatinine of 6.56.  Her potassium was 4.7.  She denies any family history of liver disorders.   Labs done 03/05/2018 reviewed and show HB 11.6 / HCT 34.   Hemochromatosis gene evaluation negative.   I have discussed with her likely etiology of iron overload felt to be due to frequent iron infusions as she reports she has been receiving this with dialysis.  She had negative hemochromatosis evaluation.  Pt reports now they have stopped iron infusions.    She will RTC in 09/2018 for repeat labs for ongoing follow-up.    2.  Renal failure.  Patient is on dialysis.  Creatinine 5 on labs done 03/05/2018.  Potassium is 3.3.  Follow-up with nephrology as recommended to discuss indications for potassium replacement.    3.  Graft thrombus.  This was noted on ultrasound done on 02/26/2018 in right AV graft.  She has undergone angioplasty by vascular. Follow-up with nephrology and vascular as directed.  4.  Uterine fibroids.  This was noted on CT that was done September 07, 2017.  She should follow-up with GYN.    5.  Smoking.  Cessation is recommended.  CT chest without contrast done 04/02/2018 reviewed and shows  IMPRESSION: Peribronchial thickening which can be seen with  bronchitis.  Aortic atherosclerosis.  Extensive coronary artery calcifications.  No lung nodules noted  Will consider repeat scan in 1 year.    Pt afebrile, denies coughing.  Findings on CT likely due to smoking history.    Interval history:  Historical data obtained from note dated 02/28/2018.  47 year old female referred by Dr. Oneida Alar for evaluation of iron overload.  She is a dialysis patient and reports she was recently diagnosed with a thrombus in her graft.  She underwent a liver biopsy on 02/07/2018 with pathology returning as iron overload.  Findings were suspected to be secondary.  When questioned she reports she has been getting regular iron infusion.  Review of chart shows she had labs done 02/10/2018 that showed a white count of 7.3 hemoglobin 10.4 platelets 187,000.  Chemistries were within normal limits other than a creatinine of 6.56.  Her potassium was 4.7.  She denies any family history of liver disorders.   Current Status:  Pt is seen today for follow-up.  She is here to go over labs.    Problem List Patient Active Problem List   Diagnosis Date Noted  . Flatulence [R14.3] 12/18/2017  . Mixed hyperlipidemia [E78.2] 11/28/2017  . Constipation [K59.00] 09/18/2017  . Abdominal pain [R10.9] 09/18/2017  . Recurrent infection of skin [L08.9] 08/21/2017  . Encounter for well woman exam with routine gynecological exam [U20.254] 01/14/2017  . Ascites [R18.8] 12/21/2016  . Fatty liver [K76.0] 11/08/2016  . Rectal bleeding [K62.5] 09/07/2016  . Legally blind [H54.8] 09/06/2016  . Hepatomegaly [R16.0]  05/04/2016  . Abnormal LFTs [R94.5] 05/04/2016  . Chronic diarrhea [K52.9] 05/04/2016  . Esophageal dysphagia [R13.10] 05/04/2016  . Anemia [D64.9] 05/04/2016  . Mood disorder ([place]) [F39] 08/12/2015  . Fibroid, uterine [D25.9] 01/24/2015  . Iron deficiency anemia due to chronic blood loss [D50.0] 01/24/2015  . Menorrhagia with irregular cycle [N92.1] 01/24/2015  . Chronic diastolic  CHF (congestive heart failure) ([place]) [I50.32] 01/21/2015  . Type 2 diabetes mellitus with ESRD (end-stage renal disease) ([place]) [S85.46, N18.6] 01/21/2015  . Diabetic neuropathy ([place]) [E11.40] 01/21/2015  . ESRD on dialysis ([place]) [N18.6, Z99.2] 01/21/2015  . Anemia of chronic disease [D63.8] 01/21/2015  . Current smoker [F17.200] 01/21/2015  . Cholecystitis, acute [K81.0] 05/26/2013  . Essential hypertension, benign [I10] 05/25/2013    Past Medical History Past Medical History:  Diagnosis Date  . Anemia of chronic disease   . Asthma   . Blind left eye   . Bronchitis   . Cataract   . Cholecystitis, acute 05/26/2013   Status post cholecystectomy  . Chronic abdominal pain   . Chronic diarrhea   . Depression   . Diabetic foot ulcer ([place]) 03/01/2015  . Diastolic heart failure ([place])   . ESRD on hemodialysis ([place])    Started diaylsis 12/29/15  . Essential hypertension   . Fibroids   . Glaucoma   . History of blood transfusion   . History of pneumonia   . Hyperlipidemia   . Insulin-dependent diabetes mellitus with retinopathy ([place])   . Neuropathy   . Osteomyelitis ([place])    Toe on left foot    Past Surgical History Past Surgical History:  Procedure Laterality Date  . A/V SHUNTOGRAM N/A 10/25/2016   Procedure: A/V Shuntogram - Right Arm;  Surgeon: Waynetta Sandy, MD;  Location: [facility];  Service: Cardiovascular;  Laterality: N/A;  . A/V SHUNTOGRAM N/A 03/05/2018   Procedure: A/V SHUNTOGRAM - Right Arm;  Surgeon: Waynetta Sandy, MD;  Location: [facility];  Service: Cardiovascular;  Laterality: N/A;  . AV FISTULA PLACEMENT Right 10/17/2015   Procedure: INSERTION OF ARTERIOVENOUS GORE-TEX GRAFT RIGHT UPPER ARM WITH ACUSEAL;  Surgeon: Conrad Powderly, MD;  Location: [facility];  Service: Vascular;  Laterality: Right;  . CATARACT EXTRACTION W/ INTRAOCULAR LENS IMPLANT Bilateral   . CESAREAN SECTION    . CHOLECYSTECTOMY N/A 05/25/2013   Procedure:  LAPAROSCOPIC CHOLECYSTECTOMY;  Surgeon: Jamesetta So, MD;  Location: [facility];  Service: General;  Laterality: N/A;  . COLONOSCOPY WITH PROPOFOL N/A 10/02/2016   Procedure: COLONOSCOPY WITH PROPOFOL;  Surgeon: Danie Binder, MD;  [facility];  Service: Endoscopy;  Laterality: N/A;  145 - pt knows to arrive at 11:15 per office  . ESOPHAGOGASTRODUODENOSCOPY (EGD) WITH PROPOFOL N/A 10/02/2016   Procedure: ESOPHAGOGASTRODUODENOSCOPY (EGD) WITH PROPOFOL;  Surgeon: Danie Binder, MD;  [facility];  Service: Endoscopy;  Laterality: N/A;  . EYE SURGERY Bilateral   . PARS PLANA VITRECTOMY Left 11/24/2014   Procedure: PARS PLANA VITRECTOMY WITH 25 GAUGE;  Surgeon: Hurman Horn, MD;  Location: [facility];  Service: Ophthalmology;  Laterality: Left;  . PERIPHERAL VASCULAR BALLOON ANGIOPLASTY Right 03/05/2018   Procedure: PERIPHERAL VASCULAR BALLOON ANGIOPLASTY;  Surgeon: Waynetta Sandy, MD;  Location: [facility];  Service: Cardiovascular;  Laterality: Right;  arm fistula  . PERIPHERAL VASCULAR CATHETERIZATION N/A 04/28/2015   Procedure: Bilateral Upper Extremity Venography;  Surgeon: Conrad Nordheim, MD;  Location: [facility];  Service:  Cardiovascular;  Laterality: N/A;  . PHOTOCOAGULATION WITH LASER Left 11/24/2014   Procedure: PHOTOCOAGULATION WITH LASER;  Surgeon: Hurman Horn, MD;  Location: [facility];  Service: Ophthalmology;  Laterality: Left;  with insertion of silicone oil  . SAVORY DILATION N/A 10/02/2016   Procedure: SAVORY DILATION;  Surgeon: Danie Binder, MD;  [facility];  Service: Endoscopy;  Laterality: N/A;  . TUBAL LIGATION      Family History Family History  Problem Relation Age of Onset  . COPD Mother   . Cancer Father   . Lymphoma Father   . Diabetes Sister   . Deep vein thrombosis Sister   . Diabetes Brother   . Hyperlipidemia Brother   . Hypertension Brother   . Mental retardation Sister   . Alcohol abuse Paternal Grandmother   .  Colon cancer Neg Hx   . Liver disease Neg Hx      Social History  reports that she has been smoking cigarettes. She has a 34.50 pack-year smoking history. She has never used smokeless tobacco. She reports that she does not drink alcohol or use drugs.  Medications  Current Outpatient Medications:  .  aspirin 81 MG tablet, Take 81 mg by mouth daily., Disp: , Rfl:  .  brimonidine-timolol (COMBIGAN) 0.2-0.5 % ophthalmic solution, Place 1 drop into both eyes every 12 (twelve) hours., Disp: , Rfl:  .  carvedilol (COREG) 12.5 MG tablet, TAKE ONE TABLET BY MOUTH TWICE DAILY., Disp: 60 tablet, Rfl: 5 .  cholecalciferol (VITAMIN D) 1000 units tablet, Take 1,000 Units by mouth daily. , Disp: , Rfl:  .  citalopram (CELEXA) 20 MG tablet, Take 1 tablet (20 mg total) by mouth daily., Disp: 30 tablet, Rfl: 2 .  Continuous Blood Gluc Sensor (FREESTYLE LIBRE 14 DAY SENSOR) MISC, Inject 1 each into the skin every 14 (fourteen) days. Use as directed., Disp: 2 each, Rfl: 2 .  fluticasone (FLONASE) 50 MCG/ACT nasal spray, Place 2 sprays into both nostrils daily as needed for allergies or rhinitis., Disp: , Rfl:  .  gabapentin (NEURONTIN) 100 MG capsule, TAKE ONE CAPSULE BY MOUTH THREE TIMES DAILY, Disp: 90 capsule, Rfl: 2 .  glucose blood (PRODIGY NO CODING BLOOD GLUC) test strip, USE TO CHECK BLOOD SUGAR TWICE DAILY., Disp: 200 each, Rfl: 3 .  hydrALAZINE (APRESOLINE) 25 MG tablet, TAKE ONE TABLET BY MOUTH THREE TIMES DAILY. (Patient taking differently: TAKE 25MG BY MOUTH TWICE DAILY), Disp: 90 tablet, Rfl: 0 .  Insulin Glargine (LANTUS SOLOSTAR) 100 UNIT/ML Solostar Pen, Inject 14 Units into the skin at bedtime., Disp: 5 pen, Rfl: 2 .  Insulin Pen Needle (PEN NEEDLES) 31G X 5 MM MISC, 1 Device by Does not apply route daily., Disp: 100 each, Rfl: PRN .  lidocaine-prilocaine (EMLA) cream, Apply 1 application topically daily as needed (arm port)., Disp: , Rfl:  .  linagliptin (TRADJENTA) 5 MG TABS tablet, Take 1  tablet (5 mg total) by mouth daily., Disp: 30 tablet, Rfl: 3 .  pantoprazole (PROTONIX) 40 MG tablet, TAKE 1 BY MOUTH DAILY 30 minutes before breakfast, Disp: 90 tablet, Rfl: 3 .  PRODIGY TWIST TOP LANCETS 28G MISC, USE TO CHECK BLOOD SUGAR UP TO FOUR TIMES DAILY., Disp: 100 each, Rfl: 1 .  sevelamer carbonate (RENVELA) 800 MG tablet, Take 2,400 mg by mouth See admin instructions. Take 2416m by mouth three times daily with means and 8075mwith snacks, Disp: , Rfl:  .  traMADol (ULTRAM) 50 MG tablet, Take 50  mg by mouth every 6 (six) hours as needed for severe pain. , Disp: , Rfl:  .  traZODone (DESYREL) 50 MG tablet, Take 0.5-1 tablets (25-50 mg total) by mouth at bedtime as needed. for sleep, Disp: 30 tablet, Rfl: 3  Allergies Bactrim [sulfamethoxazole-trimethoprim] and Prednisone  Review of Systems Review of Systems - Oncology ROS negative   Physical Exam  Vitals Wt Readings from Last 3 Encounters:  04/04/18 152 lb 8 oz (69.2 kg)  03/05/18 150 lb (68 kg)  02/28/18 151 lb 4.8 oz (68.6 kg)   Temp Readings from Last 3 Encounters:  04/04/18 97.6 F (36.4 C) (Oral)  03/05/18 98 F (36.7 C) (Tympanic)  02/26/18 (!) 97.5 F (36.4 C) (Oral)   BP Readings from Last 3 Encounters:  04/04/18 (!) 145/70  03/05/18 (!) 164/75  02/28/18 (!) 165/91   Pulse Readings from Last 3 Encounters:  04/04/18 65  03/05/18 67  02/28/18 70   Constitutional: Well-developed, well-nourished, and in no distress.   HENT: Head: Normocephalic and atraumatic.  Mouth/Throat: No oropharyngeal exudate. Mucosa moist. Eyes: Pupils are equal, round, and reactive to light. Conjunctivae are normal. No scleral icterus.  Neck: Normal range of motion. Neck supple. No JVD present.  Cardiovascular: Normal rate, regular rhythm and normal heart sounds.  Exam reveals no gallop and no friction rub.   No murmur heard. Pulmonary/Chest: Effort normal and breath sounds normal. No respiratory distress. No wheezes.No rales.   Abdominal: Soft. Bowel sounds are normal. No distension. There is no tenderness. There is no guarding.  Musculoskeletal: No edema or tenderness.  Lymphadenopathy: No cervical, axillary or supraclavicular adenopathy.  Neurological: Alert and oriented to person, place, and time. No cranial nerve deficit.  Skin: Skin is warm and dry. No rash noted. No erythema. No pallor.  Psychiatric: Affect and judgment normal.   Labs No visits with results within 3 Day(s) from this visit.  Latest known visit with results is:  Admission on 03/05/2018, Discharged on 03/05/2018  Component Date Value Ref Range Status  . Sodium 03/05/2018 136  135 - 145 mmol/L Final  . Potassium 03/05/2018 3.3* 3.5 - 5.1 mmol/L Final  . Chloride 03/05/2018 93* 98 - 111 mmol/L Final  . BUN 03/05/2018 29* 6 - 20 mg/dL Final  . Creatinine, Ser 03/05/2018 5.00* 0.44 - 1.00 mg/dL Final  . Glucose, Bld 03/05/2018 123* 70 - 99 mg/dL Final  . Calcium, Ion 03/05/2018 1.06* 1.15 - 1.40 mmol/L Final  . TCO2 03/05/2018 29  22 - 32 mmol/L Final  . Hemoglobin 03/05/2018 11.6* 12.0 - 15.0 g/dL Final  . HCT 03/05/2018 34.0* 36.0 - 46.0 % Final  . Glucose-Capillary 03/05/2018 94  70 - 99 mg/dL Final     Pathology Orders Placed This Encounter  Procedures  . CBC with Differential/Platelet    Standing Status:   Future    Standing Expiration Date:   04/04/2020  . Comprehensive metabolic panel    Standing Status:   Future    Standing Expiration Date:   04/04/2020  . Ferritin    Standing Status:   Future    Standing Expiration Date:   04/04/2020  . Lactate dehydrogenase    Standing Status:   Future    Standing Expiration Date:   04/04/2020       Zoila Shutter MD

## 2018-04-05 DIAGNOSIS — Z992 Dependence on renal dialysis: Secondary | ICD-10-CM | POA: Diagnosis not present

## 2018-04-05 DIAGNOSIS — N250 Renal osteodystrophy: Secondary | ICD-10-CM | POA: Diagnosis not present

## 2018-04-05 DIAGNOSIS — D631 Anemia in chronic kidney disease: Secondary | ICD-10-CM | POA: Diagnosis not present

## 2018-04-05 DIAGNOSIS — N186 End stage renal disease: Secondary | ICD-10-CM | POA: Diagnosis not present

## 2018-04-05 DIAGNOSIS — E8779 Other fluid overload: Secondary | ICD-10-CM | POA: Diagnosis not present

## 2018-04-05 DIAGNOSIS — D509 Iron deficiency anemia, unspecified: Secondary | ICD-10-CM | POA: Diagnosis not present

## 2018-04-08 DIAGNOSIS — Z992 Dependence on renal dialysis: Secondary | ICD-10-CM | POA: Diagnosis not present

## 2018-04-08 DIAGNOSIS — D631 Anemia in chronic kidney disease: Secondary | ICD-10-CM | POA: Diagnosis not present

## 2018-04-08 DIAGNOSIS — N186 End stage renal disease: Secondary | ICD-10-CM | POA: Diagnosis not present

## 2018-04-08 DIAGNOSIS — D509 Iron deficiency anemia, unspecified: Secondary | ICD-10-CM | POA: Diagnosis not present

## 2018-04-08 DIAGNOSIS — E8779 Other fluid overload: Secondary | ICD-10-CM | POA: Diagnosis not present

## 2018-04-08 DIAGNOSIS — N250 Renal osteodystrophy: Secondary | ICD-10-CM | POA: Diagnosis not present

## 2018-04-10 ENCOUNTER — Telehealth: Payer: Self-pay | Admitting: Family Medicine

## 2018-04-11 DIAGNOSIS — E8779 Other fluid overload: Secondary | ICD-10-CM | POA: Diagnosis not present

## 2018-04-11 DIAGNOSIS — D631 Anemia in chronic kidney disease: Secondary | ICD-10-CM | POA: Diagnosis not present

## 2018-04-11 DIAGNOSIS — Z992 Dependence on renal dialysis: Secondary | ICD-10-CM | POA: Diagnosis not present

## 2018-04-11 DIAGNOSIS — N250 Renal osteodystrophy: Secondary | ICD-10-CM | POA: Diagnosis not present

## 2018-04-11 DIAGNOSIS — D509 Iron deficiency anemia, unspecified: Secondary | ICD-10-CM | POA: Diagnosis not present

## 2018-04-11 DIAGNOSIS — N186 End stage renal disease: Secondary | ICD-10-CM | POA: Diagnosis not present

## 2018-04-12 DIAGNOSIS — N186 End stage renal disease: Secondary | ICD-10-CM | POA: Diagnosis not present

## 2018-04-12 DIAGNOSIS — D631 Anemia in chronic kidney disease: Secondary | ICD-10-CM | POA: Diagnosis not present

## 2018-04-12 DIAGNOSIS — Z992 Dependence on renal dialysis: Secondary | ICD-10-CM | POA: Diagnosis not present

## 2018-04-12 DIAGNOSIS — E8779 Other fluid overload: Secondary | ICD-10-CM | POA: Diagnosis not present

## 2018-04-12 DIAGNOSIS — D509 Iron deficiency anemia, unspecified: Secondary | ICD-10-CM | POA: Diagnosis not present

## 2018-04-12 DIAGNOSIS — N250 Renal osteodystrophy: Secondary | ICD-10-CM | POA: Diagnosis not present

## 2018-04-14 NOTE — Telephone Encounter (Signed)
Pt needs samples for lantus please call back

## 2018-04-14 NOTE — Telephone Encounter (Signed)
No lantus available. Pt aware

## 2018-04-15 DIAGNOSIS — Z992 Dependence on renal dialysis: Secondary | ICD-10-CM | POA: Diagnosis not present

## 2018-04-15 DIAGNOSIS — E8779 Other fluid overload: Secondary | ICD-10-CM | POA: Diagnosis not present

## 2018-04-15 DIAGNOSIS — D631 Anemia in chronic kidney disease: Secondary | ICD-10-CM | POA: Diagnosis not present

## 2018-04-15 DIAGNOSIS — N186 End stage renal disease: Secondary | ICD-10-CM | POA: Diagnosis not present

## 2018-04-15 DIAGNOSIS — D509 Iron deficiency anemia, unspecified: Secondary | ICD-10-CM | POA: Diagnosis not present

## 2018-04-15 DIAGNOSIS — N250 Renal osteodystrophy: Secondary | ICD-10-CM | POA: Diagnosis not present

## 2018-04-17 DIAGNOSIS — E8779 Other fluid overload: Secondary | ICD-10-CM | POA: Diagnosis not present

## 2018-04-17 DIAGNOSIS — D631 Anemia in chronic kidney disease: Secondary | ICD-10-CM | POA: Diagnosis not present

## 2018-04-17 DIAGNOSIS — N250 Renal osteodystrophy: Secondary | ICD-10-CM | POA: Diagnosis not present

## 2018-04-17 DIAGNOSIS — N186 End stage renal disease: Secondary | ICD-10-CM | POA: Diagnosis not present

## 2018-04-17 DIAGNOSIS — Z992 Dependence on renal dialysis: Secondary | ICD-10-CM | POA: Diagnosis not present

## 2018-04-17 DIAGNOSIS — D509 Iron deficiency anemia, unspecified: Secondary | ICD-10-CM | POA: Diagnosis not present

## 2018-04-19 DIAGNOSIS — E8779 Other fluid overload: Secondary | ICD-10-CM | POA: Diagnosis not present

## 2018-04-19 DIAGNOSIS — N250 Renal osteodystrophy: Secondary | ICD-10-CM | POA: Diagnosis not present

## 2018-04-19 DIAGNOSIS — Z992 Dependence on renal dialysis: Secondary | ICD-10-CM | POA: Diagnosis not present

## 2018-04-19 DIAGNOSIS — D631 Anemia in chronic kidney disease: Secondary | ICD-10-CM | POA: Diagnosis not present

## 2018-04-19 DIAGNOSIS — N186 End stage renal disease: Secondary | ICD-10-CM | POA: Diagnosis not present

## 2018-04-19 DIAGNOSIS — D509 Iron deficiency anemia, unspecified: Secondary | ICD-10-CM | POA: Diagnosis not present

## 2018-04-21 ENCOUNTER — Ambulatory Visit: Payer: Medicare Other | Admitting: Family Medicine

## 2018-04-22 DIAGNOSIS — N186 End stage renal disease: Secondary | ICD-10-CM | POA: Diagnosis not present

## 2018-04-22 DIAGNOSIS — Z992 Dependence on renal dialysis: Secondary | ICD-10-CM | POA: Diagnosis not present

## 2018-04-22 DIAGNOSIS — Z794 Long term (current) use of insulin: Secondary | ICD-10-CM | POA: Diagnosis not present

## 2018-04-22 DIAGNOSIS — N250 Renal osteodystrophy: Secondary | ICD-10-CM | POA: Diagnosis not present

## 2018-04-22 DIAGNOSIS — D509 Iron deficiency anemia, unspecified: Secondary | ICD-10-CM | POA: Diagnosis not present

## 2018-04-22 DIAGNOSIS — D631 Anemia in chronic kidney disease: Secondary | ICD-10-CM | POA: Diagnosis not present

## 2018-04-22 DIAGNOSIS — E8779 Other fluid overload: Secondary | ICD-10-CM | POA: Diagnosis not present

## 2018-04-22 DIAGNOSIS — E119 Type 2 diabetes mellitus without complications: Secondary | ICD-10-CM | POA: Diagnosis not present

## 2018-04-23 DIAGNOSIS — H4311 Vitreous hemorrhage, right eye: Secondary | ICD-10-CM | POA: Diagnosis not present

## 2018-04-23 DIAGNOSIS — H4051X3 Glaucoma secondary to other eye disorders, right eye, severe stage: Secondary | ICD-10-CM | POA: Diagnosis not present

## 2018-04-23 DIAGNOSIS — H211X1 Other vascular disorders of iris and ciliary body, right eye: Secondary | ICD-10-CM | POA: Diagnosis not present

## 2018-04-23 DIAGNOSIS — E113511 Type 2 diabetes mellitus with proliferative diabetic retinopathy with macular edema, right eye: Secondary | ICD-10-CM | POA: Diagnosis not present

## 2018-04-23 DIAGNOSIS — E113592 Type 2 diabetes mellitus with proliferative diabetic retinopathy without macular edema, left eye: Secondary | ICD-10-CM | POA: Diagnosis not present

## 2018-04-24 DIAGNOSIS — E8779 Other fluid overload: Secondary | ICD-10-CM | POA: Diagnosis not present

## 2018-04-24 DIAGNOSIS — Z992 Dependence on renal dialysis: Secondary | ICD-10-CM | POA: Diagnosis not present

## 2018-04-24 DIAGNOSIS — D509 Iron deficiency anemia, unspecified: Secondary | ICD-10-CM | POA: Diagnosis not present

## 2018-04-24 DIAGNOSIS — D631 Anemia in chronic kidney disease: Secondary | ICD-10-CM | POA: Diagnosis not present

## 2018-04-24 DIAGNOSIS — N250 Renal osteodystrophy: Secondary | ICD-10-CM | POA: Diagnosis not present

## 2018-04-24 DIAGNOSIS — N186 End stage renal disease: Secondary | ICD-10-CM | POA: Diagnosis not present

## 2018-04-28 DIAGNOSIS — E8779 Other fluid overload: Secondary | ICD-10-CM | POA: Diagnosis not present

## 2018-04-28 DIAGNOSIS — N250 Renal osteodystrophy: Secondary | ICD-10-CM | POA: Diagnosis not present

## 2018-04-28 DIAGNOSIS — Z992 Dependence on renal dialysis: Secondary | ICD-10-CM | POA: Diagnosis not present

## 2018-04-28 DIAGNOSIS — D509 Iron deficiency anemia, unspecified: Secondary | ICD-10-CM | POA: Diagnosis not present

## 2018-04-28 DIAGNOSIS — N186 End stage renal disease: Secondary | ICD-10-CM | POA: Diagnosis not present

## 2018-04-28 DIAGNOSIS — D631 Anemia in chronic kidney disease: Secondary | ICD-10-CM | POA: Diagnosis not present

## 2018-04-29 DIAGNOSIS — Z992 Dependence on renal dialysis: Secondary | ICD-10-CM | POA: Diagnosis not present

## 2018-04-29 DIAGNOSIS — N250 Renal osteodystrophy: Secondary | ICD-10-CM | POA: Diagnosis not present

## 2018-04-29 DIAGNOSIS — D509 Iron deficiency anemia, unspecified: Secondary | ICD-10-CM | POA: Diagnosis not present

## 2018-04-29 DIAGNOSIS — Z23 Encounter for immunization: Secondary | ICD-10-CM | POA: Diagnosis not present

## 2018-04-29 DIAGNOSIS — D631 Anemia in chronic kidney disease: Secondary | ICD-10-CM | POA: Diagnosis not present

## 2018-04-29 DIAGNOSIS — N186 End stage renal disease: Secondary | ICD-10-CM | POA: Diagnosis not present

## 2018-05-01 DIAGNOSIS — N250 Renal osteodystrophy: Secondary | ICD-10-CM | POA: Diagnosis not present

## 2018-05-01 DIAGNOSIS — Z23 Encounter for immunization: Secondary | ICD-10-CM | POA: Diagnosis not present

## 2018-05-01 DIAGNOSIS — Z992 Dependence on renal dialysis: Secondary | ICD-10-CM | POA: Diagnosis not present

## 2018-05-01 DIAGNOSIS — N186 End stage renal disease: Secondary | ICD-10-CM | POA: Diagnosis not present

## 2018-05-01 DIAGNOSIS — D631 Anemia in chronic kidney disease: Secondary | ICD-10-CM | POA: Diagnosis not present

## 2018-05-01 DIAGNOSIS — D509 Iron deficiency anemia, unspecified: Secondary | ICD-10-CM | POA: Diagnosis not present

## 2018-05-03 DIAGNOSIS — D631 Anemia in chronic kidney disease: Secondary | ICD-10-CM | POA: Diagnosis not present

## 2018-05-03 DIAGNOSIS — N186 End stage renal disease: Secondary | ICD-10-CM | POA: Diagnosis not present

## 2018-05-03 DIAGNOSIS — N250 Renal osteodystrophy: Secondary | ICD-10-CM | POA: Diagnosis not present

## 2018-05-03 DIAGNOSIS — Z23 Encounter for immunization: Secondary | ICD-10-CM | POA: Diagnosis not present

## 2018-05-03 DIAGNOSIS — Z992 Dependence on renal dialysis: Secondary | ICD-10-CM | POA: Diagnosis not present

## 2018-05-03 DIAGNOSIS — D509 Iron deficiency anemia, unspecified: Secondary | ICD-10-CM | POA: Diagnosis not present

## 2018-05-06 DIAGNOSIS — D509 Iron deficiency anemia, unspecified: Secondary | ICD-10-CM | POA: Diagnosis not present

## 2018-05-06 DIAGNOSIS — N250 Renal osteodystrophy: Secondary | ICD-10-CM | POA: Diagnosis not present

## 2018-05-06 DIAGNOSIS — Z23 Encounter for immunization: Secondary | ICD-10-CM | POA: Diagnosis not present

## 2018-05-06 DIAGNOSIS — Z992 Dependence on renal dialysis: Secondary | ICD-10-CM | POA: Diagnosis not present

## 2018-05-06 DIAGNOSIS — N186 End stage renal disease: Secondary | ICD-10-CM | POA: Diagnosis not present

## 2018-05-06 DIAGNOSIS — D631 Anemia in chronic kidney disease: Secondary | ICD-10-CM | POA: Diagnosis not present

## 2018-05-07 ENCOUNTER — Other Ambulatory Visit: Payer: Self-pay | Admitting: Family Medicine

## 2018-05-08 DIAGNOSIS — Z23 Encounter for immunization: Secondary | ICD-10-CM | POA: Diagnosis not present

## 2018-05-08 DIAGNOSIS — Z992 Dependence on renal dialysis: Secondary | ICD-10-CM | POA: Diagnosis not present

## 2018-05-08 DIAGNOSIS — D509 Iron deficiency anemia, unspecified: Secondary | ICD-10-CM | POA: Diagnosis not present

## 2018-05-08 DIAGNOSIS — N250 Renal osteodystrophy: Secondary | ICD-10-CM | POA: Diagnosis not present

## 2018-05-08 DIAGNOSIS — D631 Anemia in chronic kidney disease: Secondary | ICD-10-CM | POA: Diagnosis not present

## 2018-05-08 DIAGNOSIS — N186 End stage renal disease: Secondary | ICD-10-CM | POA: Diagnosis not present

## 2018-05-10 DIAGNOSIS — Z23 Encounter for immunization: Secondary | ICD-10-CM | POA: Diagnosis not present

## 2018-05-10 DIAGNOSIS — N186 End stage renal disease: Secondary | ICD-10-CM | POA: Diagnosis not present

## 2018-05-10 DIAGNOSIS — N250 Renal osteodystrophy: Secondary | ICD-10-CM | POA: Diagnosis not present

## 2018-05-10 DIAGNOSIS — Z992 Dependence on renal dialysis: Secondary | ICD-10-CM | POA: Diagnosis not present

## 2018-05-10 DIAGNOSIS — D631 Anemia in chronic kidney disease: Secondary | ICD-10-CM | POA: Diagnosis not present

## 2018-05-10 DIAGNOSIS — D509 Iron deficiency anemia, unspecified: Secondary | ICD-10-CM | POA: Diagnosis not present

## 2018-05-13 DIAGNOSIS — Z23 Encounter for immunization: Secondary | ICD-10-CM | POA: Diagnosis not present

## 2018-05-13 DIAGNOSIS — D509 Iron deficiency anemia, unspecified: Secondary | ICD-10-CM | POA: Diagnosis not present

## 2018-05-13 DIAGNOSIS — N186 End stage renal disease: Secondary | ICD-10-CM | POA: Diagnosis not present

## 2018-05-13 DIAGNOSIS — D631 Anemia in chronic kidney disease: Secondary | ICD-10-CM | POA: Diagnosis not present

## 2018-05-13 DIAGNOSIS — Z992 Dependence on renal dialysis: Secondary | ICD-10-CM | POA: Diagnosis not present

## 2018-05-13 DIAGNOSIS — N250 Renal osteodystrophy: Secondary | ICD-10-CM | POA: Diagnosis not present

## 2018-05-14 ENCOUNTER — Ambulatory Visit (INDEPENDENT_AMBULATORY_CARE_PROVIDER_SITE_OTHER): Payer: Medicare Other | Admitting: Family Medicine

## 2018-05-14 ENCOUNTER — Encounter: Payer: Self-pay | Admitting: Family Medicine

## 2018-05-14 VITALS — BP 171/84 | HR 71 | Temp 97.2°F | Ht 63.0 in | Wt 164.6 lb

## 2018-05-14 DIAGNOSIS — I10 Essential (primary) hypertension: Secondary | ICD-10-CM | POA: Diagnosis not present

## 2018-05-14 DIAGNOSIS — M6530 Trigger finger, unspecified finger: Secondary | ICD-10-CM | POA: Diagnosis not present

## 2018-05-14 DIAGNOSIS — N186 End stage renal disease: Secondary | ICD-10-CM | POA: Diagnosis not present

## 2018-05-14 DIAGNOSIS — E1142 Type 2 diabetes mellitus with diabetic polyneuropathy: Secondary | ICD-10-CM

## 2018-05-14 DIAGNOSIS — Z992 Dependence on renal dialysis: Secondary | ICD-10-CM

## 2018-05-14 DIAGNOSIS — E1122 Type 2 diabetes mellitus with diabetic chronic kidney disease: Secondary | ICD-10-CM | POA: Diagnosis not present

## 2018-05-14 DIAGNOSIS — E782 Mixed hyperlipidemia: Secondary | ICD-10-CM | POA: Diagnosis not present

## 2018-05-14 LAB — BAYER DCA HB A1C WAIVED: HB A1C (BAYER DCA - WAIVED): 6.9 % (ref <7.0)

## 2018-05-14 MED ORDER — CITALOPRAM HYDROBROMIDE 20 MG PO TABS: 20.0000 mg | ORAL_TABLET | Freq: Every day | ORAL | 4 refills | Status: DC

## 2018-05-14 MED ORDER — CARVEDILOL 12.5 MG PO TABS: 12.5000 mg | ORAL_TABLET | Freq: Two times a day (BID) | ORAL | 5 refills | Status: DC

## 2018-05-14 NOTE — Progress Notes (Signed)
BP (!) 171/84   Pulse 71   Temp (!) 97.2 F (36.2 C) (Oral)   Ht 5' 3" (1.6 m)   Wt 164 lb 9.6 oz (74.7 kg)   LMP 05/05/2015   BMI 29.16 kg/m    Subjective:    Patient ID: Kara Knapp, female    DOB: 1971/05/27, 47 y.o.   MRN: [id number]  HPI: Kara Knapp is a 47 y.o. female presenting on 05/14/2018 for Diabetes (3 month follow up- Patient states she would like her gabapentin increased); Establish Care Wendi Snipes pt); Hand Pain (Patient states she has been having bilateral finger pains that have been going on for over 3 months); and Referral (Podatrist)   HPI Type 2 diabetes mellitus Patient comes in today for recheck of his diabetes. Patient has been currently taking Lantus 14 units daily and Tradjenta and patient also takes gabapentin for neuropathy and says that has been helping. Patient is not currently on an ACE inhibitor/ARB. Patient has not seen an ophthalmologist this year. Patient denies any new issues with their feet.  She has end-stage renal disease and on dialysis and has a nephrology.  Patient has known polyneuropathy and is on gabapentin for this  Hyperlipidemia Patient is coming in for recheck of his hyperlipidemia. The patient is currently taking no medications and we discussed this and she has been intolerant previously. They deny any issues with myalgias or history of liver damage from it. They deny any focal numbness or weakness or chest pain.   Hypertension Patient is currently on hydralazine and carvedilol, and their blood pressure today is 171/84. Patient denies any lightheadedness or dizziness. Patient denies headaches, blurred vision, chest pains, shortness of breath, or weakness. Denies any side effects from medication and is content with current medication.   Pain in hand Patient has pain on the palm of her hand overlying both ring fingers.  She does feel like they get caught down sometimes and she has to pop them back to straighten them fully.  She  has been having this issue over the past few months.  Relevant past medical, surgical, family and social history reviewed and updated as indicated. Interim medical history since our last visit reviewed. Allergies and medications reviewed and updated.  Review of Systems  Constitutional: Negative for chills and fever.  Eyes: Negative for visual disturbance.  Respiratory: Negative for chest tightness and shortness of breath.   Cardiovascular: Negative for chest pain and leg swelling.  Musculoskeletal: Positive for arthralgias. Negative for back pain and gait problem.  Skin: Negative for rash.  Neurological: Negative for light-headedness and headaches.  Psychiatric/Behavioral: Negative for agitation and behavioral problems.  All other systems reviewed and are negative.   Per HPI unless specifically indicated above   Allergies as of 05/14/2018      Reactions   Bactrim [sulfamethoxazole-trimethoprim] Nausea And Vomiting   Prednisone Other (See Comments)   "I was wide open and couldn't eat" per pt.       Medication List        Accurate as of 05/14/18 11:59 PM. Always use your most recent med list.          aspirin 81 MG tablet Take 81 mg by mouth daily.   carvedilol 12.5 MG tablet Commonly known as:  COREG Take 1 tablet (12.5 mg total) by mouth 2 (two) times daily.   cholecalciferol 1000 units tablet Commonly known as:  VITAMIN D Take 1,000 Units by mouth daily.   citalopram 20 MG  tablet Commonly known as:  CELEXA Take 1 tablet (20 mg total) by mouth daily.   COMBIGAN 0.2-0.5 % ophthalmic solution Generic drug:  brimonidine-timolol Place 1 drop into both eyes every 12 (twelve) hours.   fluticasone 50 MCG/ACT nasal spray Commonly known as:  FLONASE Place 2 sprays into both nostrils daily as needed for allergies or rhinitis.   FREESTYLE LIBRE 14 DAY SENSOR Misc Inject 1 each into the skin every 14 (fourteen) days. Use as directed.   gabapentin 100 MG  capsule Commonly known as:  NEURONTIN TAKE ONE CAPSULE BY MOUTH THREE TIMES DAILY   glucose blood test strip USE TO CHECK BLOOD SUGAR TWICE DAILY.   hydrALAZINE 25 MG tablet Commonly known as:  APRESOLINE TAKE ONE TABLET BY MOUTH THREE TIMES DAILY.   Insulin Glargine 100 UNIT/ML Solostar Pen Commonly known as:  LANTUS Inject 14 Units into the skin at bedtime.   lidocaine-prilocaine cream Commonly known as:  EMLA Apply 1 application topically daily as needed (arm port).   linagliptin 5 MG Tabs tablet Commonly known as:  TRADJENTA Take 1 tablet (5 mg total) by mouth daily.   pantoprazole 40 MG tablet Commonly known as:  PROTONIX TAKE 1 BY MOUTH DAILY 30 minutes before breakfast   Pen Needles 31G X 5 MM Misc 1 Device by Does not apply route daily.   PRODIGY TWIST TOP LANCETS 28G Misc USE TO CHECK BLOOD SUGAR UP TO FOUR TIMES DAILY.   sevelamer carbonate 800 MG tablet Commonly known as:  RENVELA Take 2,400 mg by mouth See admin instructions. Take 2475m by mouth three times daily with means and 8072mwith snacks   traZODone 50 MG tablet Commonly known as:  DESYREL Take 0.5-1 tablets (25-50 mg total) by mouth at bedtime as needed. for sleep          Objective:    BP (!) 171/84   Pulse 71   Temp (!) 97.2 F (36.2 C) (Oral)   Ht 5' 3" (1.6 m)   Wt 164 lb 9.6 oz (74.7 kg)   LMP 05/05/2015   BMI 29.16 kg/m   Wt Readings from Last 3 Encounters:  05/14/18 164 lb 9.6 oz (74.7 kg)  04/04/18 152 lb 8 oz (69.2 kg)  03/05/18 150 lb (68 kg)    Physical Exam  Constitutional: She is oriented to person, place, and time. She appears well-developed and well-nourished. No distress.  Eyes: Conjunctivae are normal.  Cardiovascular: Normal rate, regular rhythm, normal heart sounds and intact distal pulses.  No murmur heard. Pulmonary/Chest: Effort normal and breath sounds normal. No respiratory distress. She has no wheezes.  Musculoskeletal: Normal range of motion. She  exhibits no edema or tenderness.  Slight tenderness to palpation overlying both palms proximal to the ring finger on both sides, unable to palpate any nodule and she is not having any triggering at this point.  Neurological: She is alert and oriented to person, place, and time. Coordination normal.  Skin: Skin is warm and dry. No rash noted. She is not diaphoretic.  Psychiatric: She has a normal mood and affect. Her behavior is normal.  Nursing note and vitals reviewed.       Assessment & Plan:   Problem List Items Addressed This Visit      Cardiovascular and Mediastinum   Essential hypertension, benign   Relevant Medications   carvedilol (COREG) 12.5 MG tablet   Other Relevant Orders   CBC with Differential/Platelet (Completed)   CMP14+EGFR (Completed)  Endocrine   Type 2 diabetes mellitus with ESRD (end-stage renal disease) ([place]) - Primary   Relevant Orders   CMP14+EGFR (Completed)   Bayer DCA Hb A1c Waived (Completed)   Diabetic neuropathy ([facility])     Genitourinary   ESRD on dialysis ([place])   Relevant Orders   CBC with Differential/Platelet (Completed)     Other   Mixed hyperlipidemia   Relevant Medications   carvedilol (COREG) 12.5 MG tablet   Other Relevant Orders   Lipid panel (Completed)    Other Visit Diagnoses    Trigger finger, unspecified finger, unspecified laterality       Patient has 2 or 3 trigger fingers on both hands and wants a referral to an orthopedic, cannot take anti-inflammatories consistently because of the kidneys   Relevant Orders   Ambulatory referral to Orthopedic Surgery       Follow up plan: Return in about 3 months (around 08/14/2018), or if symptoms worsen or fail to improve, for Type 2 diabetes.  Counseling provided for all of the vaccine components Orders Placed This Encounter  Procedures  . CBC with Differential/Platelet  . CMP14+EGFR  . Bayer DCA Hb A1c Waived  . Lipid panel  . Ambulatory referral to [place], MD [facility] 05/20/2018, 9:24 PM

## 2018-05-15 LAB — CBC WITH DIFFERENTIAL/PLATELET
BASOS ABS: 0.1 10*3/uL (ref 0.0–0.2)
BASOS: 1 %
EOS (ABSOLUTE): 0.1 10*3/uL (ref 0.0–0.4)
Eos: 2 %
HEMATOCRIT: 35.9 % (ref 34.0–46.6)
HEMOGLOBIN: 12.1 g/dL (ref 11.1–15.9)
IMMATURE GRANS (ABS): 0 10*3/uL (ref 0.0–0.1)
IMMATURE GRANULOCYTES: 0 %
LYMPHS: 13 %
Lymphocytes Absolute: 1.1 10*3/uL (ref 0.7–3.1)
MCH: 33.4 pg — AB (ref 26.6–33.0)
MCHC: 33.7 g/dL (ref 31.5–35.7)
MCV: 99 fL — AB (ref 79–97)
Monocytes Absolute: 0.7 10*3/uL (ref 0.1–0.9)
Monocytes: 9 %
NEUTROS PCT: 75 %
Neutrophils Absolute: 6.1 10*3/uL (ref 1.4–7.0)
Platelets: 171 10*3/uL (ref 150–450)
RBC: 3.62 x10E6/uL — AB (ref 3.77–5.28)
RDW: 12.6 % (ref 12.3–15.4)
WBC: 8.1 10*3/uL (ref 3.4–10.8)

## 2018-05-15 LAB — CMP14+EGFR
A/G RATIO: 1.5 (ref 1.2–2.2)
ALBUMIN: 4.4 g/dL (ref 3.5–5.5)
ALT: 25 IU/L (ref 0–32)
AST: 26 IU/L (ref 0–40)
Alkaline Phosphatase: 291 IU/L — ABNORMAL HIGH (ref 39–117)
BILIRUBIN TOTAL: 0.3 mg/dL (ref 0.0–1.2)
BUN / CREAT RATIO: 6 — AB (ref 9–23)
BUN: 29 mg/dL — ABNORMAL HIGH (ref 6–24)
CHLORIDE: 95 mmol/L — AB (ref 96–106)
CO2: 23 mmol/L (ref 20–29)
CREATININE: 4.57 mg/dL — AB (ref 0.57–1.00)
Calcium: 8.7 mg/dL (ref 8.7–10.2)
GFR calc Af Amer: 12 mL/min/{1.73_m2} — ABNORMAL LOW (ref >59)
GFR calc non Af Amer: 11 mL/min/{1.73_m2} — ABNORMAL LOW (ref >59)
GLOBULIN, TOTAL: 2.9 g/dL (ref 1.5–4.5)
Glucose: 132 mg/dL — ABNORMAL HIGH (ref 65–99)
POTASSIUM: 5.1 mmol/L (ref 3.5–5.2)
SODIUM: 138 mmol/L (ref 134–144)
Total Protein: 7.3 g/dL (ref 6.0–8.5)

## 2018-05-15 LAB — LIPID PANEL
CHOLESTEROL TOTAL: 222 mg/dL — AB (ref 100–199)
Chol/HDL Ratio: 3.4 ratio (ref 0.0–4.4)
HDL: 65 mg/dL (ref >39)
LDL CALC: 112 mg/dL — AB (ref 0–99)
Triglycerides: 227 mg/dL — ABNORMAL HIGH (ref 0–149)
VLDL Cholesterol Cal: 45 mg/dL — ABNORMAL HIGH (ref 5–40)

## 2018-05-16 DIAGNOSIS — N186 End stage renal disease: Secondary | ICD-10-CM | POA: Diagnosis not present

## 2018-05-16 DIAGNOSIS — D509 Iron deficiency anemia, unspecified: Secondary | ICD-10-CM | POA: Diagnosis not present

## 2018-05-16 DIAGNOSIS — Z23 Encounter for immunization: Secondary | ICD-10-CM | POA: Diagnosis not present

## 2018-05-16 DIAGNOSIS — N250 Renal osteodystrophy: Secondary | ICD-10-CM | POA: Diagnosis not present

## 2018-05-16 DIAGNOSIS — D631 Anemia in chronic kidney disease: Secondary | ICD-10-CM | POA: Diagnosis not present

## 2018-05-16 DIAGNOSIS — Z992 Dependence on renal dialysis: Secondary | ICD-10-CM | POA: Diagnosis not present

## 2018-05-17 DIAGNOSIS — D631 Anemia in chronic kidney disease: Secondary | ICD-10-CM | POA: Diagnosis not present

## 2018-05-17 DIAGNOSIS — Z992 Dependence on renal dialysis: Secondary | ICD-10-CM | POA: Diagnosis not present

## 2018-05-17 DIAGNOSIS — Z23 Encounter for immunization: Secondary | ICD-10-CM | POA: Diagnosis not present

## 2018-05-17 DIAGNOSIS — N186 End stage renal disease: Secondary | ICD-10-CM | POA: Diagnosis not present

## 2018-05-17 DIAGNOSIS — D509 Iron deficiency anemia, unspecified: Secondary | ICD-10-CM | POA: Diagnosis not present

## 2018-05-17 DIAGNOSIS — N250 Renal osteodystrophy: Secondary | ICD-10-CM | POA: Diagnosis not present

## 2018-05-19 ENCOUNTER — Other Ambulatory Visit: Payer: Self-pay | Admitting: Family Medicine

## 2018-05-19 MED ORDER — PEN NEEDLES 31G X 5 MM MISC: 1.0000 | Freq: Every day | 3 refills | Status: DC

## 2018-05-19 NOTE — Telephone Encounter (Signed)
Pt aware refill sent to pharmacy 

## 2018-05-19 NOTE — Telephone Encounter (Signed)
What is the name of the medication? Needles for lancent pens   Have you contacted your pharmacy to request a refill? yes  Which pharmacy would you like this sent to? [facility]   Patient notified that their request is being sent to the clinical staff for review and that they should receive a call once it is complete. If they do not receive a call within 24 hours they can check with their pharmacy or our office.

## 2018-05-20 ENCOUNTER — Telehealth: Payer: Self-pay | Admitting: Family Medicine

## 2018-05-20 DIAGNOSIS — Z992 Dependence on renal dialysis: Secondary | ICD-10-CM | POA: Diagnosis not present

## 2018-05-20 DIAGNOSIS — Z23 Encounter for immunization: Secondary | ICD-10-CM | POA: Diagnosis not present

## 2018-05-20 DIAGNOSIS — N186 End stage renal disease: Secondary | ICD-10-CM | POA: Diagnosis not present

## 2018-05-20 DIAGNOSIS — D631 Anemia in chronic kidney disease: Secondary | ICD-10-CM | POA: Diagnosis not present

## 2018-05-20 DIAGNOSIS — D509 Iron deficiency anemia, unspecified: Secondary | ICD-10-CM | POA: Diagnosis not present

## 2018-05-20 DIAGNOSIS — N250 Renal osteodystrophy: Secondary | ICD-10-CM | POA: Diagnosis not present

## 2018-05-20 NOTE — Telephone Encounter (Signed)
Patient aware of results.

## 2018-05-22 DIAGNOSIS — D631 Anemia in chronic kidney disease: Secondary | ICD-10-CM | POA: Diagnosis not present

## 2018-05-22 DIAGNOSIS — N186 End stage renal disease: Secondary | ICD-10-CM | POA: Diagnosis not present

## 2018-05-22 DIAGNOSIS — N250 Renal osteodystrophy: Secondary | ICD-10-CM | POA: Diagnosis not present

## 2018-05-22 DIAGNOSIS — Z992 Dependence on renal dialysis: Secondary | ICD-10-CM | POA: Diagnosis not present

## 2018-05-22 DIAGNOSIS — Z23 Encounter for immunization: Secondary | ICD-10-CM | POA: Diagnosis not present

## 2018-05-22 DIAGNOSIS — D509 Iron deficiency anemia, unspecified: Secondary | ICD-10-CM | POA: Diagnosis not present

## 2018-05-23 ENCOUNTER — Encounter: Payer: Self-pay | Admitting: "Endocrinology

## 2018-05-23 ENCOUNTER — Ambulatory Visit: Payer: Medicare Other | Admitting: "Endocrinology

## 2018-05-24 DIAGNOSIS — Z992 Dependence on renal dialysis: Secondary | ICD-10-CM | POA: Diagnosis not present

## 2018-05-24 DIAGNOSIS — Z23 Encounter for immunization: Secondary | ICD-10-CM | POA: Diagnosis not present

## 2018-05-24 DIAGNOSIS — D509 Iron deficiency anemia, unspecified: Secondary | ICD-10-CM | POA: Diagnosis not present

## 2018-05-24 DIAGNOSIS — D631 Anemia in chronic kidney disease: Secondary | ICD-10-CM | POA: Diagnosis not present

## 2018-05-24 DIAGNOSIS — N186 End stage renal disease: Secondary | ICD-10-CM | POA: Diagnosis not present

## 2018-05-24 DIAGNOSIS — N250 Renal osteodystrophy: Secondary | ICD-10-CM | POA: Diagnosis not present

## 2018-05-26 ENCOUNTER — Other Ambulatory Visit: Payer: Self-pay | Admitting: "Endocrinology

## 2018-05-26 ENCOUNTER — Other Ambulatory Visit: Payer: Self-pay | Admitting: Family Medicine
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
## 2018-05-27 DIAGNOSIS — D509 Iron deficiency anemia, unspecified: Secondary | ICD-10-CM | POA: Diagnosis not present

## 2018-05-27 DIAGNOSIS — N250 Renal osteodystrophy: Secondary | ICD-10-CM | POA: Diagnosis not present

## 2018-05-27 DIAGNOSIS — D631 Anemia in chronic kidney disease: Secondary | ICD-10-CM | POA: Diagnosis not present

## 2018-05-27 DIAGNOSIS — Z23 Encounter for immunization: Secondary | ICD-10-CM | POA: Diagnosis not present

## 2018-05-27 DIAGNOSIS — Z992 Dependence on renal dialysis: Secondary | ICD-10-CM | POA: Diagnosis not present

## 2018-05-27 DIAGNOSIS — N186 End stage renal disease: Secondary | ICD-10-CM | POA: Diagnosis not present

## 2018-05-28 ENCOUNTER — Ambulatory Visit (INDEPENDENT_AMBULATORY_CARE_PROVIDER_SITE_OTHER): Payer: Medicare Other | Admitting: Psychiatry

## 2018-05-28 ENCOUNTER — Encounter (HOSPITAL_COMMUNITY): Payer: Self-pay | Admitting: Psychiatry

## 2018-05-28 VITALS — BP 120/80 | HR 67 | Ht 63.0 in | Wt 164.0 lb

## 2018-05-28 DIAGNOSIS — F331 Major depressive disorder, recurrent, moderate: Secondary | ICD-10-CM | POA: Diagnosis not present

## 2018-05-28 DIAGNOSIS — G4700 Insomnia, unspecified: Secondary | ICD-10-CM | POA: Diagnosis not present

## 2018-05-28 DIAGNOSIS — F419 Anxiety disorder, unspecified: Secondary | ICD-10-CM | POA: Diagnosis not present

## 2018-05-28 DIAGNOSIS — F1421 Cocaine dependence, in remission: Secondary | ICD-10-CM | POA: Diagnosis not present

## 2018-05-28 MED ORDER — SERTRALINE HCL 50 MG PO TABS: ORAL_TABLET | ORAL | 0 refills | Status: DC

## 2018-05-28 NOTE — Patient Instructions (Addendum)
1. Decrease citalopram 10 mg daily for one week, then discontinue  2. Start sertraline 25 mg at night for one week, then 50 mg at night 3. Return to clinic in one month for 30 mins

## 2018-05-29 DIAGNOSIS — Z992 Dependence on renal dialysis: Secondary | ICD-10-CM | POA: Diagnosis not present

## 2018-05-29 DIAGNOSIS — N186 End stage renal disease: Secondary | ICD-10-CM | POA: Diagnosis not present

## 2018-05-30 DIAGNOSIS — N186 End stage renal disease: Secondary | ICD-10-CM | POA: Diagnosis not present

## 2018-05-30 DIAGNOSIS — D631 Anemia in chronic kidney disease: Secondary | ICD-10-CM | POA: Diagnosis not present

## 2018-05-30 DIAGNOSIS — D509 Iron deficiency anemia, unspecified: Secondary | ICD-10-CM | POA: Diagnosis not present

## 2018-05-30 DIAGNOSIS — Z992 Dependence on renal dialysis: Secondary | ICD-10-CM | POA: Diagnosis not present

## 2018-05-30 DIAGNOSIS — N250 Renal osteodystrophy: Secondary | ICD-10-CM | POA: Diagnosis not present

## 2018-05-31 DIAGNOSIS — D631 Anemia in chronic kidney disease: Secondary | ICD-10-CM | POA: Diagnosis not present

## 2018-05-31 DIAGNOSIS — N186 End stage renal disease: Secondary | ICD-10-CM | POA: Diagnosis not present

## 2018-05-31 DIAGNOSIS — D509 Iron deficiency anemia, unspecified: Secondary | ICD-10-CM | POA: Diagnosis not present

## 2018-05-31 DIAGNOSIS — Z992 Dependence on renal dialysis: Secondary | ICD-10-CM | POA: Diagnosis not present

## 2018-05-31 DIAGNOSIS — N250 Renal osteodystrophy: Secondary | ICD-10-CM | POA: Diagnosis not present

## 2018-06-03 DIAGNOSIS — D509 Iron deficiency anemia, unspecified: Secondary | ICD-10-CM | POA: Diagnosis not present

## 2018-06-03 DIAGNOSIS — Z992 Dependence on renal dialysis: Secondary | ICD-10-CM | POA: Diagnosis not present

## 2018-06-03 DIAGNOSIS — N186 End stage renal disease: Secondary | ICD-10-CM | POA: Diagnosis not present

## 2018-06-03 DIAGNOSIS — N250 Renal osteodystrophy: Secondary | ICD-10-CM | POA: Diagnosis not present

## 2018-06-03 DIAGNOSIS — D631 Anemia in chronic kidney disease: Secondary | ICD-10-CM | POA: Diagnosis not present

## 2018-06-05 DIAGNOSIS — D631 Anemia in chronic kidney disease: Secondary | ICD-10-CM | POA: Diagnosis not present

## 2018-06-05 DIAGNOSIS — Z992 Dependence on renal dialysis: Secondary | ICD-10-CM | POA: Diagnosis not present

## 2018-06-05 DIAGNOSIS — N250 Renal osteodystrophy: Secondary | ICD-10-CM | POA: Diagnosis not present

## 2018-06-05 DIAGNOSIS — N186 End stage renal disease: Secondary | ICD-10-CM | POA: Diagnosis not present

## 2018-06-05 DIAGNOSIS — D509 Iron deficiency anemia, unspecified: Secondary | ICD-10-CM | POA: Diagnosis not present

## 2018-06-07 DIAGNOSIS — N186 End stage renal disease: Secondary | ICD-10-CM | POA: Diagnosis not present

## 2018-06-07 DIAGNOSIS — N250 Renal osteodystrophy: Secondary | ICD-10-CM | POA: Diagnosis not present

## 2018-06-07 DIAGNOSIS — Z992 Dependence on renal dialysis: Secondary | ICD-10-CM | POA: Diagnosis not present

## 2018-06-07 DIAGNOSIS — D509 Iron deficiency anemia, unspecified: Secondary | ICD-10-CM | POA: Diagnosis not present

## 2018-06-07 DIAGNOSIS — D631 Anemia in chronic kidney disease: Secondary | ICD-10-CM | POA: Diagnosis not present

## 2018-06-10 DIAGNOSIS — N186 End stage renal disease: Secondary | ICD-10-CM | POA: Diagnosis not present

## 2018-06-10 DIAGNOSIS — Z992 Dependence on renal dialysis: Secondary | ICD-10-CM | POA: Diagnosis not present

## 2018-06-10 DIAGNOSIS — N250 Renal osteodystrophy: Secondary | ICD-10-CM | POA: Diagnosis not present

## 2018-06-10 DIAGNOSIS — D631 Anemia in chronic kidney disease: Secondary | ICD-10-CM | POA: Diagnosis not present

## 2018-06-10 DIAGNOSIS — D509 Iron deficiency anemia, unspecified: Secondary | ICD-10-CM | POA: Diagnosis not present

## 2018-06-11 ENCOUNTER — Encounter: Payer: Self-pay | Admitting: Podiatry

## 2018-06-11 ENCOUNTER — Ambulatory Visit (INDEPENDENT_AMBULATORY_CARE_PROVIDER_SITE_OTHER): Payer: Medicare Other | Admitting: Podiatry

## 2018-06-11 DIAGNOSIS — B351 Tinea unguium: Secondary | ICD-10-CM | POA: Diagnosis not present

## 2018-06-11 DIAGNOSIS — M79675 Pain in left toe(s): Secondary | ICD-10-CM | POA: Diagnosis not present

## 2018-06-11 DIAGNOSIS — E1142 Type 2 diabetes mellitus with diabetic polyneuropathy: Secondary | ICD-10-CM

## 2018-06-11 DIAGNOSIS — M79674 Pain in right toe(s): Secondary | ICD-10-CM

## 2018-06-11 NOTE — Progress Notes (Signed)
Complaint:  Visit Type: Patient returns to my office for continued preventative foot care services. Complaint: Patient states" my nails have grown long and thick and become painful to walk and wear shoes" Patient has been diagnosed with DM with no foot complications. The patient presents for preventative foot care services. No changes to ROS  Podiatric Exam: Vascular: dorsalis pedis and posterior tibial pulses are palpable bilateral. Capillary return is immediate. Temperature gradient is WNL. Skin turgor WNL  Sensorium: Diminished   Semmes Weinstein monofilament test. Normal tactile sensation bilaterally. Nail Exam: Pt has thick disfigured discolored nails with subungual debris noted bilateral entire nail hallux toenails Ulcer Exam: There is no evidence of ulcer or pre-ulcerative changes or infection. Orthopedic Exam: Muscle tone and strength are WNL. No limitations in general ROM. No crepitus or effusions noted. Foot type and digits show no abnormalities. Bony prominences are unremarkable. Skin: No Porokeratosis. No infection or ulcers.  Plantar fibrma right foot.  Diagnosis:  Onychomycosis, , Pain in right toe, pain in left toes  Treatment & Plan Procedures and Treatment: Consent by patient was obtained for treatment procedures.   Debridement of mycotic and hypertrophic toenails, 1 through 5 bilateral and clearing of subungual debris. No ulceration, no infection noted.  Return Visit-Office Procedure: Patient instructed to return to the office for a follow up visit 3 months for continued evaluation and treatment.    Gardiner Barefoot DPM

## 2018-06-14 DIAGNOSIS — D509 Iron deficiency anemia, unspecified: Secondary | ICD-10-CM | POA: Diagnosis not present

## 2018-06-14 DIAGNOSIS — N250 Renal osteodystrophy: Secondary | ICD-10-CM | POA: Diagnosis not present

## 2018-06-14 DIAGNOSIS — Z992 Dependence on renal dialysis: Secondary | ICD-10-CM | POA: Diagnosis not present

## 2018-06-14 DIAGNOSIS — N186 End stage renal disease: Secondary | ICD-10-CM | POA: Diagnosis not present

## 2018-06-14 DIAGNOSIS — D631 Anemia in chronic kidney disease: Secondary | ICD-10-CM | POA: Diagnosis not present

## 2018-06-17 DIAGNOSIS — Z992 Dependence on renal dialysis: Secondary | ICD-10-CM | POA: Diagnosis not present

## 2018-06-17 DIAGNOSIS — D509 Iron deficiency anemia, unspecified: Secondary | ICD-10-CM | POA: Diagnosis not present

## 2018-06-17 DIAGNOSIS — N250 Renal osteodystrophy: Secondary | ICD-10-CM | POA: Diagnosis not present

## 2018-06-17 DIAGNOSIS — N186 End stage renal disease: Secondary | ICD-10-CM | POA: Diagnosis not present

## 2018-06-17 DIAGNOSIS — D631 Anemia in chronic kidney disease: Secondary | ICD-10-CM | POA: Diagnosis not present

## 2018-06-18 DIAGNOSIS — H4051X3 Glaucoma secondary to other eye disorders, right eye, severe stage: Secondary | ICD-10-CM | POA: Diagnosis not present

## 2018-06-18 DIAGNOSIS — E113511 Type 2 diabetes mellitus with proliferative diabetic retinopathy with macular edema, right eye: Secondary | ICD-10-CM | POA: Diagnosis not present

## 2018-06-18 DIAGNOSIS — E113592 Type 2 diabetes mellitus with proliferative diabetic retinopathy without macular edema, left eye: Secondary | ICD-10-CM | POA: Diagnosis not present

## 2018-06-18 DIAGNOSIS — H4052X3 Glaucoma secondary to other eye disorders, left eye, severe stage: Secondary | ICD-10-CM | POA: Diagnosis not present

## 2018-06-19 DIAGNOSIS — N186 End stage renal disease: Secondary | ICD-10-CM | POA: Diagnosis not present

## 2018-06-19 DIAGNOSIS — D631 Anemia in chronic kidney disease: Secondary | ICD-10-CM | POA: Diagnosis not present

## 2018-06-19 DIAGNOSIS — D509 Iron deficiency anemia, unspecified: Secondary | ICD-10-CM | POA: Diagnosis not present

## 2018-06-19 DIAGNOSIS — N250 Renal osteodystrophy: Secondary | ICD-10-CM | POA: Diagnosis not present

## 2018-06-19 DIAGNOSIS — Z992 Dependence on renal dialysis: Secondary | ICD-10-CM | POA: Diagnosis not present

## 2018-06-20 DIAGNOSIS — Z992 Dependence on renal dialysis: Secondary | ICD-10-CM | POA: Diagnosis not present

## 2018-06-20 DIAGNOSIS — N186 End stage renal disease: Secondary | ICD-10-CM | POA: Diagnosis not present

## 2018-06-20 DIAGNOSIS — E8770 Fluid overload, unspecified: Secondary | ICD-10-CM | POA: Diagnosis not present

## 2018-06-21 DIAGNOSIS — N250 Renal osteodystrophy: Secondary | ICD-10-CM | POA: Diagnosis not present

## 2018-06-21 DIAGNOSIS — N186 End stage renal disease: Secondary | ICD-10-CM | POA: Diagnosis not present

## 2018-06-21 DIAGNOSIS — Z992 Dependence on renal dialysis: Secondary | ICD-10-CM | POA: Diagnosis not present

## 2018-06-21 DIAGNOSIS — D631 Anemia in chronic kidney disease: Secondary | ICD-10-CM | POA: Diagnosis not present

## 2018-06-21 DIAGNOSIS — D509 Iron deficiency anemia, unspecified: Secondary | ICD-10-CM | POA: Diagnosis not present

## 2018-06-24 DIAGNOSIS — D631 Anemia in chronic kidney disease: Secondary | ICD-10-CM | POA: Diagnosis not present

## 2018-06-24 DIAGNOSIS — Z992 Dependence on renal dialysis: Secondary | ICD-10-CM | POA: Diagnosis not present

## 2018-06-24 DIAGNOSIS — N250 Renal osteodystrophy: Secondary | ICD-10-CM | POA: Diagnosis not present

## 2018-06-24 DIAGNOSIS — N186 End stage renal disease: Secondary | ICD-10-CM | POA: Diagnosis not present

## 2018-06-24 DIAGNOSIS — D509 Iron deficiency anemia, unspecified: Secondary | ICD-10-CM | POA: Diagnosis not present

## 2018-06-27 ENCOUNTER — Other Ambulatory Visit: Payer: Self-pay | Admitting: Family Medicine

## 2018-06-27 DIAGNOSIS — D631 Anemia in chronic kidney disease: Secondary | ICD-10-CM | POA: Diagnosis not present

## 2018-06-27 DIAGNOSIS — N186 End stage renal disease: Secondary | ICD-10-CM | POA: Diagnosis not present

## 2018-06-27 DIAGNOSIS — D509 Iron deficiency anemia, unspecified: Secondary | ICD-10-CM | POA: Diagnosis not present

## 2018-06-27 DIAGNOSIS — Z992 Dependence on renal dialysis: Secondary | ICD-10-CM | POA: Diagnosis not present

## 2018-06-27 DIAGNOSIS — N250 Renal osteodystrophy: Secondary | ICD-10-CM | POA: Diagnosis not present

## 2018-06-27 NOTE — Telephone Encounter (Signed)
Last seen 10.16.19

## 2018-06-28 DIAGNOSIS — D509 Iron deficiency anemia, unspecified: Secondary | ICD-10-CM | POA: Diagnosis not present

## 2018-06-28 DIAGNOSIS — Z992 Dependence on renal dialysis: Secondary | ICD-10-CM | POA: Diagnosis not present

## 2018-06-28 DIAGNOSIS — N186 End stage renal disease: Secondary | ICD-10-CM | POA: Diagnosis not present

## 2018-06-28 DIAGNOSIS — N250 Renal osteodystrophy: Secondary | ICD-10-CM | POA: Diagnosis not present

## 2018-06-28 DIAGNOSIS — D631 Anemia in chronic kidney disease: Secondary | ICD-10-CM | POA: Diagnosis not present

## 2018-06-30 ENCOUNTER — Ambulatory Visit (INDEPENDENT_AMBULATORY_CARE_PROVIDER_SITE_OTHER): Payer: Medicare Other | Admitting: Nurse Practitioner

## 2018-06-30 ENCOUNTER — Encounter

## 2018-06-30 ENCOUNTER — Encounter: Payer: Self-pay | Admitting: Nurse Practitioner

## 2018-06-30 VITALS — BP 125/68 | HR 69 | Temp 97.0°F | Ht 63.0 in | Wt 170.2 lb

## 2018-06-30 DIAGNOSIS — K5900 Constipation, unspecified: Secondary | ICD-10-CM

## 2018-06-30 DIAGNOSIS — R1084 Generalized abdominal pain: Secondary | ICD-10-CM

## 2018-06-30 DIAGNOSIS — R7989 Other specified abnormal findings of blood chemistry: Secondary | ICD-10-CM

## 2018-06-30 DIAGNOSIS — R945 Abnormal results of liver function studies: Secondary | ICD-10-CM

## 2018-06-30 DIAGNOSIS — R197 Diarrhea, unspecified: Secondary | ICD-10-CM | POA: Insufficient documentation

## 2018-06-30 DIAGNOSIS — R109 Unspecified abdominal pain: Secondary | ICD-10-CM | POA: Insufficient documentation

## 2018-06-30 NOTE — Assessment & Plan Note (Signed)
The patient was diagnosed with secondary iron overload by hematology.  Her last ferritin showed improvement to 901.  I will recheck this today.  Recommend she keep her follow-up appointment in 2020 with hematology.

## 2018-06-30 NOTE — Patient Instructions (Signed)
1. I am giving you samples of Amitiza 8 mcg.  Take this twice a day, after a meal/on a full stomach. 2. Call us in 1 week and let us know if this is helping you have better bowel movements without significant diarrhea. 3. Have your blood test drawn when you are able to. 4. Follow-up in our office in 3 months. 5. Call us if you have any questions or concerns.  At [facility] we value your feedback. You may receive a survey about your visit today. Please share your experience as we strive to create trusting relationships with our patients to provide genuine, compassionate, quality care.  We appreciate your understanding and patience as we review any laboratory studies, imaging, and other diagnostic tests that are ordered as we care for you. Our office policy is 5 business days for review of these results, and any emergent or urgent results are addressed in a timely manner for your best interest. If you do not hear from our office in 1 week, please contact us.   We also encourage the use of MyChart, which contains your medical information for your review as well. If you are not enrolled in this feature, an access code is on this after visit summary for your convenience. Thank you for allowing [place] to be involved in your care.  It was great to see you today!  I hope you have a Merry Christmas!!
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
## 2018-06-30 NOTE — Assessment & Plan Note (Signed)
Generalized mild abdominal cramping, particularly when she does have a bowel movement.  Sometimes the pain relieves after a bowel movement.  This is somewhat classic for irritable bowel syndrome.  She likely has IBS-C with some diarrhea recently related to medication intolerance.  I will have her stop Linzess, start Amitiza 8 mcg twice daily.  We will provide samples and ask for a progress report in 1 week.  Further medication changes based on clinical response.  Follow-up in 3 months.

## 2018-06-30 NOTE — Assessment & Plan Note (Signed)
The patient did have a couple episodes of diarrhea on Linzess.  This is likely due to intolerance of her medication.  She is no longer taking Linzess and I agree with this.  We will start her on a different medication as per below.  Follow-up in 3 months.

## 2018-06-30 NOTE — Progress Notes (Signed)
Referring Provider: Timmothy Euler, MD Primary Care Physician:  Dettinger, Fransisca Kaufmann, MD Primary GI:  Dr. Oneida Alar  Chief Complaint  Patient presents with  . Diarrhea    usually occurs when she has to go somewhere  . Constipation  . Abdominal Cramping    HPI:   Kara Knapp is a 47 y.o. female who presents for follow-up on constipation and abdominal pain.  The patient was last seen in our office 12/18/2017 for constipation, fatty liver, fluctuance.  She was previously seen for cirrhosis.  History of dysphasia and EGD with dilation in March 2018, barium pill esophagram in April 2018 showed esophageal motility disorder.  History of fatty liver disease and hepatomegaly.  Previous attempted paracentesis with no fluid returned.  Query early cirrhosis but no stigmata of portal hypertension on EGD.  Platelet count normal.  Albumin drifting down but in the setting of chronic dialysis and poor intake.  Only definitive means to determine cirrhosis was a liver biopsy but this is deferred due to acute health issues currently ongoing.  CT of the abdomen and pelvis 09/07/2017 with upper limit normal liver size and minimal surface nodularity.  Previously completed labs done on 09/23/2017 including CBC, CMP, INR.  Hemoglobin low normal at 11.0 with known chronic renal disease.  Platelets normal.  CMP significantly high glucose of 472 and was recommended to follow-up with primary care.  Creatinine elevated as expected at 3.97.  Elevated alkaline phosphatase at 349.  Recommended GGT which was elevated at 89.  Recommended further work-up including autoimmune labs have not been completed.  Her last visit she noted Linzess helped her constipation, bowel movement daily which is typically soft and passes easily, rarely with straining.  Does not observe her stool for hematochezia or melena.  No other GI symptoms.  Recommended follow-up labs, continue current medications, restart probiotics to see if this helps of  fluctuance.  Follow-up in 3 months.  Autoimmune labs completed 12/25/2017 which were normal.  Ferritin was significantly elevated at 1201, iron was normal at 121, ceruloplasmin normal at 32.  Again recommended a liver biopsy.  Ultrasound-guided medical liver biopsy was completed 02/07/2018.  Surgical pathology found the biopsy to be marked secondary iron overload, moderate fibrosis with fibrous septa that show rare bridging (stage 2 of 4).  Differentials include drug-induced liver injury or fibrosis due to iron overload the patient was referred to hematology/oncology.  The patient last saw hematology 04/04/2018.  It was felt iron overload was secondary nature and likely due to frequent iron infusions as she reported she had been receiving those with dialysis.  Negative hemochromatosis evaluation.  She notes that dialysis is subsequently stopped iron infusions.  Recommended return for follow-up in 09/2018 for repeat labs in ongoing follow-up.  Most recent ferritin level completed 02/28/2018 which was decreased/improved to 901.  Most recent CMP 05/14/2018 found normal AST/ALT, bilirubin.  Alkaline phosphatase improved to 291.  Today she states she's doing "not great" overall. She is accompanied by a companion who states she has a nervous stomach. When she has to go somewhere, she has fecal urgency with loose stools; otherwise has constipation that she has to "dig it out." Her companion states she has a lot of the same symptoms the companion's grandmother had "when she was diagnosed with rotten guts." Has a bowel movement about every day. Stools are hard, tries to not strain and instead will "dig it out." She is not taking anything for constipation. She was given "something before  but it sent me to the bathroom too quick" but didn't inform us, so she just stopped taking it. She is wanting something that "doesn't make me go to the bathroom a whole whole lot" due to wanting to avoid having diarrhea at dialysis. Does  having abdominal cramping, which improves sometimes after a bowel movement. Denies N/V. Had hematochezia for the past week and notes "it's coming from both ends" (and motions toward her vagina and rectum). Has an appointment with GYN. Recently had all her teeth pulled, awaiting 3 month healing time to get fitted for dentures. Denies chest pain, dyspnea, dizziness, lightheadedness, syncope, near syncope. Denies any other upper or lower GI symptoms.  Past Medical History:  Diagnosis Date  . Anemia of chronic disease   . Asthma   . Blind left eye   . Bronchitis   . Cataract   . Cholecystitis, acute 05/26/2013   Status post cholecystectomy  . Chronic abdominal pain   . Chronic diarrhea   . Depression   . Diabetic foot ulcer ([place]) 03/01/2015  . Diastolic heart failure ([place])   . ESRD on hemodialysis ([place])    Started diaylsis 12/29/15  . Essential hypertension   . Fibroids   . Glaucoma   . History of blood transfusion   . History of pneumonia   . Hyperlipidemia   . Insulin-dependent diabetes mellitus with retinopathy ([place])   . Neuropathy   . Osteomyelitis ([place])    Toe on left foot    Past Surgical History:  Procedure Laterality Date  . A/V SHUNTOGRAM N/A 10/25/2016   Procedure: A/V Shuntogram - Right Arm;  Surgeon: Waynetta Sandy, MD;  Location: [facility];  Service: Cardiovascular;  Laterality: N/A;  . A/V SHUNTOGRAM N/A 03/05/2018   Procedure: A/V SHUNTOGRAM - Right Arm;  Surgeon: Waynetta Sandy, MD;  Location: [facility];  Service: Cardiovascular;  Laterality: N/A;  . AV FISTULA PLACEMENT Right 10/17/2015   Procedure: INSERTION OF ARTERIOVENOUS GORE-TEX GRAFT RIGHT UPPER ARM WITH ACUSEAL;  Surgeon: Conrad Nixon, MD;  Location: [facility];  Service: Vascular;  Laterality: Right;  . CATARACT EXTRACTION W/ INTRAOCULAR LENS IMPLANT Bilateral   . CESAREAN SECTION    . CHOLECYSTECTOMY N/A 05/25/2013   Procedure: LAPAROSCOPIC CHOLECYSTECTOMY;  Surgeon: Jamesetta So, MD;  Location: [facility];  Service: General;  Laterality: N/A;  . COLONOSCOPY WITH PROPOFOL N/A 10/02/2016   Procedure: COLONOSCOPY WITH PROPOFOL;  Surgeon: Danie Binder, MD;  [facility];  Service: Endoscopy;  Laterality: N/A;  145 - pt knows to arrive at 11:15 per office  . ESOPHAGOGASTRODUODENOSCOPY (EGD) WITH PROPOFOL N/A 10/02/2016   Procedure: ESOPHAGOGASTRODUODENOSCOPY (EGD) WITH PROPOFOL;  Surgeon: Danie Binder, MD;  [facility];  Service: Endoscopy;  Laterality: N/A;  . EYE SURGERY Bilateral   . PARS PLANA VITRECTOMY Left 11/24/2014   Procedure: PARS PLANA VITRECTOMY WITH 25 GAUGE;  Surgeon: Hurman Horn, MD;  Location: [facility];  Service: Ophthalmology;  Laterality: Left;  . PERIPHERAL VASCULAR BALLOON ANGIOPLASTY Right 03/05/2018   Procedure: PERIPHERAL VASCULAR BALLOON ANGIOPLASTY;  Surgeon: Waynetta Sandy, MD;  Location: [facility];  Service: Cardiovascular;  Laterality: Right;  arm fistula  . PERIPHERAL VASCULAR CATHETERIZATION N/A 04/28/2015   Procedure: Bilateral Upper Extremity Venography;  Surgeon: Conrad La Union, MD;  Location: [facility];  Service: Cardiovascular;  Laterality: N/A;  . PHOTOCOAGULATION WITH LASER Left 11/24/2014   Procedure: PHOTOCOAGULATION WITH LASER;  Surgeon:  Hurman Horn, MD;  Location: [facility];  Service: Ophthalmology;  Laterality: Left;  with insertion of silicone oil  . SAVORY DILATION N/A 10/02/2016   Procedure: SAVORY DILATION;  Surgeon: Danie Binder, MD;  [facility];  Service: Endoscopy;  Laterality: N/A;  . TUBAL LIGATION      Current Outpatient Medications  Medication Sig Dispense Refill  . aspirin 81 MG tablet Take 81 mg by mouth daily.    . brimonidine-timolol (COMBIGAN) 0.2-0.5 % ophthalmic solution Place 1 drop into both eyes daily at 12 noon.     . carvedilol (COREG) 12.5 MG tablet Take 1 tablet (12.5 mg total) by mouth 2 (two) times daily. 60 tablet 5  . cholecalciferol (VITAMIN  D) 1000 units tablet Take 1,000 Units by mouth daily.     . Continuous Blood Gluc Sensor (FREESTYLE LIBRE 14 DAY SENSOR) MISC Inject 1 each into the skin every 14 (fourteen) days. Use as directed. 2 each 2  . fluticasone (FLONASE) 50 MCG/ACT nasal spray Place 2 sprays into both nostrils daily as needed for allergies or rhinitis.    Marland Kitchen gabapentin (NEURONTIN) 100 MG capsule TAKE ONE CAPSULE BY MOUTH THREE TIMES DAILY 90 capsule 2  . glucose blood (PRODIGY NO CODING BLOOD GLUC) test strip USE TO CHECK BLOOD SUGAR TWICE DAILY. 200 each 3  . Insulin Glargine (LANTUS SOLOSTAR) 100 UNIT/ML Solostar Pen Inject 14 Units into the skin at bedtime. (Patient taking differently: Inject 18 Units into the skin at bedtime. ) 5 pen 2  . Insulin Pen Needle (PEN NEEDLES) 31G X 5 MM MISC 1 Device by Does not apply route daily. 100 each 3  . lidocaine-prilocaine (EMLA) cream Apply 1 application topically daily as needed (arm port).    . pantoprazole (PROTONIX) 40 MG tablet TAKE 1 BY MOUTH DAILY 30 minutes before breakfast 90 tablet 3  . PRODIGY TWIST TOP LANCETS 28G MISC USE TO CHECK BLOOD SUGAR UP TO FOUR TIMES DAILY. 100 each 1  . sertraline (ZOLOFT) 50 MG tablet 25 mg daily for one week, then 50 mg daily 30 tablet 0  . sevelamer carbonate (RENVELA) 800 MG tablet Take 2,400 mg by mouth See admin instructions. Take 2447m by mouth three times daily with means and 8070mwith snacks    . TRADJENTA 5 MG TABS tablet TAKE ONE TABLET BY MOUTH DAILY 30 tablet 3  . traZODone (DESYREL) 50 MG tablet TAKE 1/2 TO 1 TABLET BY MOUTH AT BEDTIME AS NEEDED FOR SLEEP. 30 tablet 2   No current facility-administered medications for this visit.     Allergies as of 06/30/2018 - Review Complete 06/30/2018  Allergen Reaction Noted  . Bactrim [sulfamethoxazole-trimethoprim] Nausea And Vomiting 05/21/2013  . Prednisone Other (See Comments) 07/09/2013    Family History  Problem Relation Age of Onset  . COPD Mother   . Cancer Father   .  Lymphoma Father   . Diabetes Sister   . Deep vein thrombosis Sister   . Diabetes Brother   . Hyperlipidemia Brother   . Hypertension Brother   . Mental retardation Sister   . Alcohol abuse Paternal Grandmother   . Colon cancer Neg Hx   . Liver disease Neg Hx     Social History   Socioeconomic History  . Marital status: Single    Spouse name: Not on file  . Number of children: 5  . Years of education: GED  . Highest education level: Not on file  Occupational History  .  Not on file  Social Needs  . Financial resource strain: Not on file  . Food insecurity:    Worry: Not on file    Inability: Not on file  . Transportation needs:    Medical: Not on file    Non-medical: Not on file  Tobacco Use  . Smoking status: Current Every Day Smoker    Packs/day: 1.50    Years: 23.00    Pack years: 34.50    Types: Cigarettes  . Smokeless tobacco: Never Used  . Tobacco comment: one pack daily  Substance and Sexual Activity  . Alcohol use: No    Alcohol/week: 0.0 standard drinks    Frequency: Never  . Drug use: No    Comment: Sober for 8 years  . Sexual activity: Not Currently    Birth control/protection: Surgical  Lifestyle  . Physical activity:    Days per week: Not on file    Minutes per session: Not on file  . Stress: Not on file  Relationships  . Social connections:    Talks on phone: Not on file    Gets together: Not on file    Attends religious service: Not on file    Active member of club or organization: Not on file    Attends meetings of clubs or organizations: Not on file    Relationship status: Not on file  Other Topics Concern  . Not on file  Social History Narrative  . Not on file    Review of Systems: Complete ROS negative except as per HPI.   Physical Exam: BP 125/68   Pulse 69   Temp (!) 97 F (36.1 C) (Oral)   Ht 5' 3"  (1.6 m)   Wt 170 lb 3.2 oz (77.2 kg)   LMP 05/05/2015   BMI 30.15 kg/m  General:   Alert and oriented. Pleasant and  cooperative. Well-nourished and well-developed.  Eyes:  Without icterus, sclera clear and conjunctiva pink.  Ears:  Normal auditory acuity. Mouth:  Missing all teeth  Cardiovascular:  S1, S2 present without murmurs appreciated. Extremities without clubbing or edema. Respiratory:  Clear to auscultation bilaterally. No wheezes, rales, or rhonchi. No distress.  Gastrointestinal:  +BS, soft, non-tender and non-distended. No HSM noted. No guarding or rebound. No masses appreciated.  Rectal:  Deferred  Musculoskalatal:  Sitting in a wheelchair, limited mobility. Neurologic:  Alert and oriented x4;  grossly normal neurologically. Psych:  Alert and cooperative. Normal mood and affect. Heme/Lymph/Immune: No excessive bruising noted.    06/30/2018 12:12 PM   Disclaimer: This note was dictated with voice recognition software. Similar sounding words can inadvertently be transcribed and may not be corrected upon review.

## 2018-06-30 NOTE — Progress Notes (Signed)
cc'ed to pcp °

## 2018-06-30 NOTE — Assessment & Plan Note (Signed)
The patient has constipation and states that she will often "just to get out myself."  Linzess caused diarrhea and she stopped taking it but did not notify us of the adverse effect.  Recommend she not take Linzess any further.  I will give her samples of Amitiza to help have better bowel movements. Progress report in 1 week and follow-up in 3 months.

## 2018-07-01 DIAGNOSIS — N186 End stage renal disease: Secondary | ICD-10-CM | POA: Diagnosis not present

## 2018-07-01 DIAGNOSIS — N250 Renal osteodystrophy: Secondary | ICD-10-CM | POA: Diagnosis not present

## 2018-07-01 DIAGNOSIS — D509 Iron deficiency anemia, unspecified: Secondary | ICD-10-CM | POA: Diagnosis not present

## 2018-07-01 DIAGNOSIS — Z992 Dependence on renal dialysis: Secondary | ICD-10-CM | POA: Diagnosis not present

## 2018-07-01 DIAGNOSIS — D631 Anemia in chronic kidney disease: Secondary | ICD-10-CM | POA: Diagnosis not present

## 2018-07-02 ENCOUNTER — Ambulatory Visit (HOSPITAL_COMMUNITY): Payer: Medicare Other | Admitting: Psychiatry

## 2018-07-03 ENCOUNTER — Other Ambulatory Visit (HOSPITAL_COMMUNITY): Payer: Self-pay | Admitting: Psychiatry

## 2018-07-03 DIAGNOSIS — N250 Renal osteodystrophy: Secondary | ICD-10-CM | POA: Diagnosis not present

## 2018-07-03 DIAGNOSIS — D631 Anemia in chronic kidney disease: Secondary | ICD-10-CM | POA: Diagnosis not present

## 2018-07-03 DIAGNOSIS — Z992 Dependence on renal dialysis: Secondary | ICD-10-CM | POA: Diagnosis not present

## 2018-07-03 DIAGNOSIS — N186 End stage renal disease: Secondary | ICD-10-CM | POA: Diagnosis not present

## 2018-07-03 DIAGNOSIS — D509 Iron deficiency anemia, unspecified: Secondary | ICD-10-CM | POA: Diagnosis not present

## 2018-07-03 MED ORDER — SERTRALINE HCL 50 MG PO TABS: 50.0000 mg | ORAL_TABLET | Freq: Every day | ORAL | 0 refills | Status: DC

## 2018-07-05 DIAGNOSIS — Z992 Dependence on renal dialysis: Secondary | ICD-10-CM | POA: Diagnosis not present

## 2018-07-05 DIAGNOSIS — D631 Anemia in chronic kidney disease: Secondary | ICD-10-CM | POA: Diagnosis not present

## 2018-07-05 DIAGNOSIS — N186 End stage renal disease: Secondary | ICD-10-CM | POA: Diagnosis not present

## 2018-07-05 DIAGNOSIS — D509 Iron deficiency anemia, unspecified: Secondary | ICD-10-CM | POA: Diagnosis not present

## 2018-07-05 DIAGNOSIS — N250 Renal osteodystrophy: Secondary | ICD-10-CM | POA: Diagnosis not present

## 2018-07-07 ENCOUNTER — Ambulatory Visit: Payer: Medicare Other | Admitting: Obstetrics and Gynecology
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
## 2018-07-08 DIAGNOSIS — D509 Iron deficiency anemia, unspecified: Secondary | ICD-10-CM | POA: Diagnosis not present

## 2018-07-08 DIAGNOSIS — N250 Renal osteodystrophy: Secondary | ICD-10-CM | POA: Diagnosis not present

## 2018-07-08 DIAGNOSIS — Z992 Dependence on renal dialysis: Secondary | ICD-10-CM | POA: Diagnosis not present

## 2018-07-08 DIAGNOSIS — N186 End stage renal disease: Secondary | ICD-10-CM | POA: Diagnosis not present

## 2018-07-08 DIAGNOSIS — D631 Anemia in chronic kidney disease: Secondary | ICD-10-CM | POA: Diagnosis not present

## 2018-07-09 ENCOUNTER — Telehealth: Payer: Self-pay | Admitting: Gastroenterology

## 2018-07-09 ENCOUNTER — Other Ambulatory Visit: Payer: Self-pay | Admitting: "Endocrinology

## 2018-07-09 DIAGNOSIS — K5900 Constipation, unspecified: Secondary | ICD-10-CM

## 2018-07-09 NOTE — Telephone Encounter (Signed)
  Pt needs Rx for the Amitiza 8 Mcg.  She said she is having a lot of flatulence. She forgot to start back on probiotics to see if that helps.  She will get some and try and let us know how it works.

## 2018-07-09 NOTE — Telephone Encounter (Signed)
Pt's daughter in law/aide Nurse, learning disability) called to say that the Amitiza was helping and needed a prescription called into [facility] in [place]. Pt is also still having bad gas and would like the nurse to call her back. Pt's number is [phone] or the daughter in [place] number is [phone]

## 2018-07-10 DIAGNOSIS — N186 End stage renal disease: Secondary | ICD-10-CM | POA: Diagnosis not present

## 2018-07-10 DIAGNOSIS — D631 Anemia in chronic kidney disease: Secondary | ICD-10-CM | POA: Diagnosis not present

## 2018-07-10 DIAGNOSIS — N250 Renal osteodystrophy: Secondary | ICD-10-CM | POA: Diagnosis not present

## 2018-07-10 DIAGNOSIS — D509 Iron deficiency anemia, unspecified: Secondary | ICD-10-CM | POA: Diagnosis not present

## 2018-07-10 DIAGNOSIS — Z992 Dependence on renal dialysis: Secondary | ICD-10-CM | POA: Diagnosis not present

## 2018-07-11 MED ORDER — LUBIPROSTONE 8 MCG PO CAPS: 8.0000 ug | ORAL_CAPSULE | Freq: Two times a day (BID) | ORAL | 3 refills | Status: DC

## 2018-07-11 NOTE — Addendum Note (Signed)
Addended by: Gordy Levan, Angle Dirusso A on: 07/11/2018 09:48 AM   Modules accepted: Orders

## 2018-07-11 NOTE — Telephone Encounter (Signed)
Pt is aware.  

## 2018-07-11 NOTE — Telephone Encounter (Signed)
Randall Hiss, please send Rx for Amitiza. Pt just called again.

## 2018-07-11 NOTE — Telephone Encounter (Signed)
Rx sent 

## 2018-07-12 DIAGNOSIS — N250 Renal osteodystrophy: Secondary | ICD-10-CM | POA: Diagnosis not present

## 2018-07-12 DIAGNOSIS — D509 Iron deficiency anemia, unspecified: Secondary | ICD-10-CM | POA: Diagnosis not present

## 2018-07-12 DIAGNOSIS — D631 Anemia in chronic kidney disease: Secondary | ICD-10-CM | POA: Diagnosis not present

## 2018-07-12 DIAGNOSIS — Z992 Dependence on renal dialysis: Secondary | ICD-10-CM | POA: Diagnosis not present

## 2018-07-12 DIAGNOSIS — N186 End stage renal disease: Secondary | ICD-10-CM | POA: Diagnosis not present

## 2018-07-14 ENCOUNTER — Ambulatory Visit (INDEPENDENT_AMBULATORY_CARE_PROVIDER_SITE_OTHER): Payer: Medicare Other | Admitting: Psychiatry

## 2018-07-14 ENCOUNTER — Telehealth: Payer: Self-pay

## 2018-07-14 ENCOUNTER — Encounter (HOSPITAL_COMMUNITY): Payer: Self-pay | Admitting: Psychiatry

## 2018-07-14 VITALS — BP 156/84 | HR 70 | Ht 63.0 in | Wt 171.0 lb

## 2018-07-14 DIAGNOSIS — G4700 Insomnia, unspecified: Secondary | ICD-10-CM | POA: Diagnosis not present

## 2018-07-14 DIAGNOSIS — F331 Major depressive disorder, recurrent, moderate: Secondary | ICD-10-CM | POA: Diagnosis not present

## 2018-07-14 DIAGNOSIS — F419 Anxiety disorder, unspecified: Secondary | ICD-10-CM | POA: Diagnosis not present

## 2018-07-14 MED ORDER — SERTRALINE HCL 100 MG PO TABS: 100.0000 mg | ORAL_TABLET | Freq: Every day | ORAL | 1 refills | Status: DC

## 2018-07-14 NOTE — Telephone Encounter (Signed)
I have started the process for PA for Amitiza.

## 2018-07-14 NOTE — Patient Instructions (Signed)
1. Increase sertraline 100 mg daily  2. Return to clinic in two months for 15 mins

## 2018-07-15 DIAGNOSIS — Z992 Dependence on renal dialysis: Secondary | ICD-10-CM | POA: Diagnosis not present

## 2018-07-15 DIAGNOSIS — N186 End stage renal disease: Secondary | ICD-10-CM | POA: Diagnosis not present

## 2018-07-15 DIAGNOSIS — D631 Anemia in chronic kidney disease: Secondary | ICD-10-CM | POA: Diagnosis not present

## 2018-07-15 DIAGNOSIS — D509 Iron deficiency anemia, unspecified: Secondary | ICD-10-CM | POA: Diagnosis not present

## 2018-07-15 DIAGNOSIS — N250 Renal osteodystrophy: Secondary | ICD-10-CM | POA: Diagnosis not present

## 2018-07-15 NOTE — Telephone Encounter (Signed)
Received denial from [facility] that this is non formulary. I called and spoke to [facility] who said they do not have preferred drugs for this. Randall Hiss, please advise!

## 2018-07-18 NOTE — Telephone Encounter (Signed)
They don't offer ANY options for constipation??  Can we double check, cause that would seem VERY unusual.

## 2018-07-19 DIAGNOSIS — Z992 Dependence on renal dialysis: Secondary | ICD-10-CM | POA: Diagnosis not present

## 2018-07-19 DIAGNOSIS — N250 Renal osteodystrophy: Secondary | ICD-10-CM | POA: Diagnosis not present

## 2018-07-19 DIAGNOSIS — N186 End stage renal disease: Secondary | ICD-10-CM | POA: Diagnosis not present

## 2018-07-19 DIAGNOSIS — D631 Anemia in chronic kidney disease: Secondary | ICD-10-CM | POA: Diagnosis not present

## 2018-07-19 DIAGNOSIS — D509 Iron deficiency anemia, unspecified: Secondary | ICD-10-CM | POA: Diagnosis not present

## 2018-07-21 DIAGNOSIS — D509 Iron deficiency anemia, unspecified: Secondary | ICD-10-CM | POA: Diagnosis not present

## 2018-07-21 DIAGNOSIS — N186 End stage renal disease: Secondary | ICD-10-CM | POA: Diagnosis not present

## 2018-07-21 DIAGNOSIS — Z992 Dependence on renal dialysis: Secondary | ICD-10-CM | POA: Diagnosis not present

## 2018-07-21 DIAGNOSIS — N250 Renal osteodystrophy: Secondary | ICD-10-CM | POA: Diagnosis not present

## 2018-07-21 DIAGNOSIS — D631 Anemia in chronic kidney disease: Secondary | ICD-10-CM | POA: Diagnosis not present

## 2018-07-25 ENCOUNTER — Telehealth: Payer: Self-pay | Admitting: Gastroenterology

## 2018-07-25 DIAGNOSIS — N186 End stage renal disease: Secondary | ICD-10-CM | POA: Diagnosis not present

## 2018-07-25 DIAGNOSIS — Z992 Dependence on renal dialysis: Secondary | ICD-10-CM | POA: Diagnosis not present

## 2018-07-25 DIAGNOSIS — Z794 Long term (current) use of insulin: Secondary | ICD-10-CM | POA: Diagnosis not present

## 2018-07-25 DIAGNOSIS — D509 Iron deficiency anemia, unspecified: Secondary | ICD-10-CM | POA: Diagnosis not present

## 2018-07-25 DIAGNOSIS — E119 Type 2 diabetes mellitus without complications: Secondary | ICD-10-CM | POA: Diagnosis not present

## 2018-07-25 DIAGNOSIS — D631 Anemia in chronic kidney disease: Secondary | ICD-10-CM | POA: Diagnosis not present

## 2018-07-25 DIAGNOSIS — N250 Renal osteodystrophy: Secondary | ICD-10-CM | POA: Diagnosis not present

## 2018-07-25 NOTE — Telephone Encounter (Signed)
Pt said she doesn't remember the name of the medication we put her on, but it isn't helping her and causing her to have diarrhea. She is wanting to be seen in the office and I offered her OV for next Tuesday.

## 2018-07-25 NOTE — Telephone Encounter (Signed)
Cancelled OV for Tuesday

## 2018-07-25 NOTE — Telephone Encounter (Signed)
Spoke with pt. She is going to call back next week. Pt wasn't able to secure a ride for Tuesday.

## 2018-07-26 DIAGNOSIS — Z992 Dependence on renal dialysis: Secondary | ICD-10-CM | POA: Diagnosis not present

## 2018-07-26 DIAGNOSIS — D631 Anemia in chronic kidney disease: Secondary | ICD-10-CM | POA: Diagnosis not present

## 2018-07-26 DIAGNOSIS — D509 Iron deficiency anemia, unspecified: Secondary | ICD-10-CM | POA: Diagnosis not present

## 2018-07-26 DIAGNOSIS — N250 Renal osteodystrophy: Secondary | ICD-10-CM | POA: Diagnosis not present

## 2018-07-26 DIAGNOSIS — N186 End stage renal disease: Secondary | ICD-10-CM | POA: Diagnosis not present

## 2018-07-28 NOTE — Telephone Encounter (Signed)
Today I spoke with Kara Knapp and he said alternatives would be Linzess, Movantik or Relistor. He said you can give order for either and I can call them back with info.

## 2018-07-28 NOTE — Telephone Encounter (Signed)
Please see previous note about the Amitiza. That is what is causing her diarrhea. I told her insurance will not cover and we will be sending in something else when Randall Hiss gets the message.

## 2018-07-29 ENCOUNTER — Ambulatory Visit: Payer: Self-pay | Admitting: Gastroenterology

## 2018-07-29 DIAGNOSIS — D509 Iron deficiency anemia, unspecified: Secondary | ICD-10-CM | POA: Diagnosis not present

## 2018-07-29 DIAGNOSIS — D631 Anemia in chronic kidney disease: Secondary | ICD-10-CM | POA: Diagnosis not present

## 2018-07-29 DIAGNOSIS — N250 Renal osteodystrophy: Secondary | ICD-10-CM | POA: Diagnosis not present

## 2018-07-29 DIAGNOSIS — N186 End stage renal disease: Secondary | ICD-10-CM | POA: Diagnosis not present

## 2018-07-29 DIAGNOSIS — Z992 Dependence on renal dialysis: Secondary | ICD-10-CM | POA: Diagnosis not present

## 2018-07-29 NOTE — Telephone Encounter (Signed)
I tried Linzess and she had adverse effects. Movantik and Relistor are for opioid induced constipation (NOT FDA approved for idiopathic constipation or IBS-C). She is not on any opiates so this would be "off label use"

## 2018-07-31 DIAGNOSIS — Z992 Dependence on renal dialysis: Secondary | ICD-10-CM | POA: Diagnosis not present

## 2018-07-31 DIAGNOSIS — D631 Anemia in chronic kidney disease: Secondary | ICD-10-CM | POA: Diagnosis not present

## 2018-07-31 DIAGNOSIS — N250 Renal osteodystrophy: Secondary | ICD-10-CM | POA: Diagnosis not present

## 2018-07-31 DIAGNOSIS — D509 Iron deficiency anemia, unspecified: Secondary | ICD-10-CM | POA: Diagnosis not present

## 2018-07-31 DIAGNOSIS — N186 End stage renal disease: Secondary | ICD-10-CM | POA: Diagnosis not present

## 2018-08-01 NOTE — Telephone Encounter (Signed)
PT said she quit taking the Amitza 8 mcg, it was causing diarrhea also. She had 2 Bm's yesterday, but sometimes she has to dig out. She is not taking the probiotic, which helped some. She has tried miralax, but can't remember why she couldn't take that. Randall Hiss, please advise!

## 2018-08-01 NOTE — Telephone Encounter (Signed)
The first thing is to restart things that helped. Restart probiotic. Also start Colace 100 mg daily, Miralax 1-2 times a day as needed. She may end up taking Colace daily and MiraLAX daily.  Call us back in 1-2 weeks and let us know if it's helping. Please note she needs to give it regular effort and daily use to see if it will help.

## 2018-08-02 DIAGNOSIS — N186 End stage renal disease: Secondary | ICD-10-CM | POA: Diagnosis not present

## 2018-08-02 DIAGNOSIS — D509 Iron deficiency anemia, unspecified: Secondary | ICD-10-CM | POA: Diagnosis not present

## 2018-08-02 DIAGNOSIS — N250 Renal osteodystrophy: Secondary | ICD-10-CM | POA: Diagnosis not present

## 2018-08-02 DIAGNOSIS — Z992 Dependence on renal dialysis: Secondary | ICD-10-CM | POA: Diagnosis not present

## 2018-08-02 DIAGNOSIS — D631 Anemia in chronic kidney disease: Secondary | ICD-10-CM | POA: Diagnosis not present

## 2018-08-04 DIAGNOSIS — M79642 Pain in left hand: Secondary | ICD-10-CM | POA: Diagnosis not present

## 2018-08-04 DIAGNOSIS — M79641 Pain in right hand: Secondary | ICD-10-CM | POA: Diagnosis not present

## 2018-08-04 NOTE — Telephone Encounter (Signed)
LM for a return call.

## 2018-08-05 DIAGNOSIS — Z992 Dependence on renal dialysis: Secondary | ICD-10-CM | POA: Diagnosis not present

## 2018-08-05 DIAGNOSIS — N250 Renal osteodystrophy: Secondary | ICD-10-CM | POA: Diagnosis not present

## 2018-08-05 DIAGNOSIS — N186 End stage renal disease: Secondary | ICD-10-CM | POA: Diagnosis not present

## 2018-08-05 DIAGNOSIS — D631 Anemia in chronic kidney disease: Secondary | ICD-10-CM | POA: Diagnosis not present

## 2018-08-05 DIAGNOSIS — D509 Iron deficiency anemia, unspecified: Secondary | ICD-10-CM | POA: Diagnosis not present

## 2018-08-05 NOTE — Telephone Encounter (Signed)
L/M to call.

## 2018-08-06 ENCOUNTER — Telehealth: Payer: Self-pay | Admitting: Gastroenterology

## 2018-08-06 NOTE — Telephone Encounter (Signed)
Pt called saying that DS had tried calling her. I told her DS was out today and I would put a message in for her to call her back. [phone]

## 2018-08-06 NOTE — Telephone Encounter (Signed)
Pt returned call. Pt notified of EG's recommendations.

## 2018-08-06 NOTE — Telephone Encounter (Signed)
See other phone note for documentation.

## 2018-08-07 ENCOUNTER — Other Ambulatory Visit (HOSPITAL_COMMUNITY): Payer: Self-pay | Admitting: Nephrology

## 2018-08-07 ENCOUNTER — Other Ambulatory Visit: Payer: Self-pay | Admitting: Radiology

## 2018-08-07 DIAGNOSIS — N186 End stage renal disease: Secondary | ICD-10-CM

## 2018-08-08 ENCOUNTER — Other Ambulatory Visit: Payer: Self-pay

## 2018-08-08 ENCOUNTER — Other Ambulatory Visit (HOSPITAL_COMMUNITY): Payer: Self-pay | Admitting: Nephrology

## 2018-08-08 ENCOUNTER — Encounter (HOSPITAL_COMMUNITY): Payer: Self-pay

## 2018-08-08 ENCOUNTER — Ambulatory Visit (HOSPITAL_COMMUNITY)
Admission: RE | Admit: 2018-08-08 | Discharge: 2018-08-08 | Disposition: A | Discharge status: Home / Self Care | Payer: Medicare Other | Source: Ambulatory Visit | Attending: Nephrology | Admitting: Nephrology

## 2018-08-08 DIAGNOSIS — Z794 Long term (current) use of insulin: Secondary | ICD-10-CM | POA: Insufficient documentation

## 2018-08-08 DIAGNOSIS — H5462 Unqualified visual loss, left eye, normal vision right eye: Secondary | ICD-10-CM | POA: Insufficient documentation

## 2018-08-08 DIAGNOSIS — I132 Hypertensive heart and chronic kidney disease with heart failure and with stage 5 chronic kidney disease, or end stage renal disease: Secondary | ICD-10-CM | POA: Diagnosis not present

## 2018-08-08 DIAGNOSIS — Y832 Surgical operation with anastomosis, bypass or graft as the cause of abnormal reaction of the patient, or of later complication, without mention of misadventure at the time of the procedure: Secondary | ICD-10-CM | POA: Insufficient documentation

## 2018-08-08 DIAGNOSIS — E11319 Type 2 diabetes mellitus with unspecified diabetic retinopathy without macular edema: Secondary | ICD-10-CM | POA: Diagnosis not present

## 2018-08-08 DIAGNOSIS — T82858A Stenosis of vascular prosthetic devices, implants and grafts, initial encounter: Secondary | ICD-10-CM | POA: Diagnosis not present

## 2018-08-08 DIAGNOSIS — Z8249 Family history of ischemic heart disease and other diseases of the circulatory system: Secondary | ICD-10-CM | POA: Diagnosis not present

## 2018-08-08 DIAGNOSIS — Z9851 Tubal ligation status: Secondary | ICD-10-CM | POA: Diagnosis not present

## 2018-08-08 DIAGNOSIS — Z95820 Peripheral vascular angioplasty status with implants and grafts: Secondary | ICD-10-CM | POA: Insufficient documentation

## 2018-08-08 DIAGNOSIS — E785 Hyperlipidemia, unspecified: Secondary | ICD-10-CM | POA: Insufficient documentation

## 2018-08-08 DIAGNOSIS — T8249XA Other complication of vascular dialysis catheter, initial encounter: Secondary | ICD-10-CM | POA: Diagnosis not present

## 2018-08-08 DIAGNOSIS — F17210 Nicotine dependence, cigarettes, uncomplicated: Secondary | ICD-10-CM | POA: Diagnosis not present

## 2018-08-08 DIAGNOSIS — Z833 Family history of diabetes mellitus: Secondary | ICD-10-CM | POA: Diagnosis not present

## 2018-08-08 DIAGNOSIS — Z7982 Long term (current) use of aspirin: Secondary | ICD-10-CM | POA: Diagnosis not present

## 2018-08-08 DIAGNOSIS — Z992 Dependence on renal dialysis: Secondary | ICD-10-CM | POA: Diagnosis not present

## 2018-08-08 DIAGNOSIS — Z888 Allergy status to other drugs, medicaments and biological substances status: Secondary | ICD-10-CM | POA: Diagnosis not present

## 2018-08-08 DIAGNOSIS — Z881 Allergy status to other antibiotic agents status: Secondary | ICD-10-CM | POA: Diagnosis not present

## 2018-08-08 DIAGNOSIS — N186 End stage renal disease: Secondary | ICD-10-CM

## 2018-08-08 DIAGNOSIS — I5030 Unspecified diastolic (congestive) heart failure: Secondary | ICD-10-CM | POA: Insufficient documentation

## 2018-08-08 DIAGNOSIS — Z79899 Other long term (current) drug therapy: Secondary | ICD-10-CM | POA: Insufficient documentation

## 2018-08-08 DIAGNOSIS — E1122 Type 2 diabetes mellitus with diabetic chronic kidney disease: Secondary | ICD-10-CM | POA: Insufficient documentation

## 2018-08-08 DIAGNOSIS — T82868A Thrombosis of vascular prosthetic devices, implants and grafts, initial encounter: Secondary | ICD-10-CM | POA: Diagnosis not present

## 2018-08-08 DIAGNOSIS — E1140 Type 2 diabetes mellitus with diabetic neuropathy, unspecified: Secondary | ICD-10-CM | POA: Diagnosis not present

## 2018-08-08 HISTORY — PX: IR US GUIDE VASC ACCESS RIGHT: IMG2390

## 2018-08-08 HISTORY — PX: IR THROMBECTOMY AV FISTULA W/THROMBOLYSIS/PTA/STENT INC/SHUNT/IMG RT: IMG6120

## 2018-08-08 LAB — CBC
HCT: 34.1 % — ABNORMAL LOW (ref 36.0–46.0)
Hemoglobin: 11.3 g/dL — ABNORMAL LOW (ref 12.0–15.0)
MCH: 33.7 pg (ref 26.0–34.0)
MCHC: 33.1 g/dL (ref 30.0–36.0)
MCV: 101.8 fL — ABNORMAL HIGH (ref 80.0–100.0)
NRBC: 0 % (ref 0.0–0.2)
Platelets: 148 10*3/uL — ABNORMAL LOW (ref 150–400)
RBC: 3.35 MIL/uL — ABNORMAL LOW (ref 3.87–5.11)
RDW: 12.8 % (ref 11.5–15.5)
WBC: 7.2 10*3/uL (ref 4.0–10.5)

## 2018-08-08 LAB — BASIC METABOLIC PANEL
Anion gap: 13 (ref 5–15)
BUN: 57 mg/dL — ABNORMAL HIGH (ref 6–20)
CO2: 24 mmol/L (ref 22–32)
Calcium: 8.1 mg/dL — ABNORMAL LOW (ref 8.9–10.3)
Chloride: 96 mmol/L — ABNORMAL LOW (ref 98–111)
Creatinine, Ser: 8.33 mg/dL — ABNORMAL HIGH (ref 0.44–1.00)
GFR calc non Af Amer: 5 mL/min — ABNORMAL LOW (ref >60)
GFR, EST AFRICAN AMERICAN: 6 mL/min — AB (ref >60)
Glucose, Bld: 142 mg/dL — ABNORMAL HIGH (ref 70–99)
Potassium: 5 mmol/L (ref 3.5–5.1)
SODIUM: 133 mmol/L — AB (ref 135–145)

## 2018-08-08 LAB — GLUCOSE, CAPILLARY: GLUCOSE-CAPILLARY: 121 mg/dL — AB (ref 70–99)

## 2018-08-08 LAB — PROTIME-INR
INR: 0.96
Prothrombin Time: 12.7 seconds (ref 11.4–15.2)

## 2018-08-08 MED ORDER — SODIUM CHLORIDE 0.9 % IV SOLN
INTRAVENOUS | Status: AC | PRN
  Administered 2018-08-08: 10 mL/h via INTRAVENOUS

## 2018-08-08 MED ORDER — FENTANYL CITRATE (PF) 100 MCG/2ML IJ SOLN
INTRAMUSCULAR | Status: AC
  Filled 2018-08-08: qty 4

## 2018-08-08 MED ORDER — MIDAZOLAM HCL 2 MG/2ML IJ SOLN
INTRAMUSCULAR | Status: AC
  Filled 2018-08-08: qty 4

## 2018-08-08 MED ORDER — SODIUM CHLORIDE 0.9 % IV SOLN: INTRAVENOUS | Status: DC

## 2018-08-08 MED ORDER — LIDOCAINE HCL 1 % IJ SOLN
INTRAMUSCULAR | Status: AC | PRN
  Administered 2018-08-08: 10 mL

## 2018-08-08 MED ORDER — HEPARIN SODIUM (PORCINE) 1000 UNIT/ML IJ SOLN
INTRAMUSCULAR | Status: AC
  Filled 2018-08-08: qty 1

## 2018-08-08 MED ORDER — ALTEPLASE 2 MG IJ SOLR
INTRAMUSCULAR | Status: AC | PRN
  Administered 2018-08-08 (×2): 2 mg

## 2018-08-08 MED ORDER — FENTANYL CITRATE (PF) 100 MCG/2ML IJ SOLN
INTRAMUSCULAR | Status: AC | PRN
  Administered 2018-08-08 (×2): 25 ug via INTRAVENOUS

## 2018-08-08 MED ORDER — HEPARIN SODIUM (PORCINE) 1000 UNIT/ML IJ SOLN
INTRAMUSCULAR | Status: AC | PRN
  Administered 2018-08-08: 5000 [IU] via INTRAVENOUS

## 2018-08-08 MED ORDER — IOPAMIDOL (ISOVUE-300) INJECTION 61%
INTRAVENOUS | Status: AC
  Administered 2018-08-08: 60 mL
  Filled 2018-08-08: qty 100

## 2018-08-08 MED ORDER — MIDAZOLAM HCL 2 MG/2ML IJ SOLN
INTRAMUSCULAR | Status: AC | PRN
  Administered 2018-08-08: 0.5 mg via INTRAVENOUS
  Administered 2018-08-08 (×2): 1 mg via INTRAVENOUS

## 2018-08-08 MED ORDER — LIDOCAINE HCL 1 % IJ SOLN
INTRAMUSCULAR | Status: AC
  Filled 2018-08-08: qty 20

## 2018-08-08 MED ORDER — ALTEPLASE 2 MG IJ SOLR
INTRAMUSCULAR | Status: AC
  Filled 2018-08-08: qty 4

## 2018-08-08 NOTE — Discharge Instructions (Signed)
Dialysis Fistulogram  A dialysis fistulogram is an X-ray test to look inside the site where blood is removed and returned to your body during dialysis. Fistulas and grafts provide easy and effective access for dialysis. However, sometimes problems can occur. The fistula or graft can become clogged or narrow. This can make dialysis less effective. You may need to have a fistulogram to check for these problems. If a problem is found, your health care provider can do treatments during the procedure to clear the blocked or narrowed areas. Treatments to open a blocked dialysis fistula or graft may include:  Removing the clot from the fistula or graft with a device (thrombectomy).  A procedure to open or widen the blocked area with a balloon (angioplasty).  Placing a small mesh tube (stent) to keep the fistula or graft open.  Injecting a medicine to dissolve the clot (thrombolysis). Tell a health care provider about:  Any allergies you have, including any reactions you have had to contrast dye.  All medicines you are taking, including vitamins, herbs, eye drops, creams, and over-the-counter medicines.  Any problems you or family members have had with anesthetic medicines.  Any blood disorders you have.  Any surgeries you have had.  Any medical conditions you have.  Whether you are pregnant or may be pregnant.  Any pain, redness, or swelling you have in your limb that has the dialysis fistula or graft.  Any bleeding from your dialysis fistula or graft.  Any of the following in the part of your body where the dialysis fistula or graft leads: ? Numbness. ? Tingling. ? A cold feeling. ? Areas that are bluish or pale white in color. What are the risks? Generally, this is a safe procedure. However, problems may occur, including:  Infection.  Bleeding.  Allergic reactions to medicines or dyes.  Damage to other structures, such as nearby nerves or blood vessels.  A blood clot in the  dialysis fistula or graft.  A blood clot that travels to your lungs (pulmonary embolism). What happens before the procedure? General instructions  Follow instructions from your health care provider about eating and drinking restrictions.  Ask your health care provider about: ? Changing or stopping your regular medicines. This is especially important if you are taking diabetes medicines or blood thinners (anticoagulants). ? Taking medicines such as aspirin and ibuprofen. These medicines can thin your blood. Do not take these medicines unless your health care provider tells you to take them. ? Taking over-the-counter medicines, vitamins, herbs, and supplements.  Plan to have someone take you home from the hospital or clinic.  Plan to have a responsible adult care for you for at least 24 hours after you leave the hospital or clinic. This is important.  You may have a blood or urine sample taken. These will help your health care provider learn how well your kidneys and liver are working and how well your blood clots. What happens during the procedure?  An IV will be inserted into one of your veins.  You will be given one or more of the following: ? A medicine to help you relax (sedative). ? A medicine to numb the area (local anesthetic) on the limb with your fistula or graft.  A needle will be inserted into your vein.  A small, thin tube (catheter) will be inserted into the needle and guided to the area of possible problems.  Dye (contrast material) will be injected into the catheter. As the contrast material passes through  your body, you may feel warm.  X-rays will be taken. The contrast material will show where any narrowing or blockages are.  If narrowing or a blockage is seen, the health care provider may do one of the following to open the blockage: ? Thrombectomy. The health care provider will use a mechanical device at the end of the catheter to break up the  clot. ? Angioplasty. The health care provider will advance a guide wire and balloon-tipped catheter to the blockage site. The balloon will be inflated for a short period of time. A stent may also be placed to keep the blocked area open. ? Thrombolysis. The health care provider will deliver a clot-dissolving medicine through the catheter.  When the procedure is complete, the catheter will be removed.  In some cases, the catheter site will be closed with stitches (sutures).  Pressure will be applied to stop any bleeding, and your skin will be covered with a bandage (dressing). The procedure may vary among health care providers and hospitals. What happens after the procedure?  Your blood pressure, heart rate, breathing rate, and blood oxygen level will be monitored until you leave the hospital or clinic. Your health care provider will also monitor your access site.  You will need to stay in bed for several hours.  Do not drive for 24 hours if you were given a sedative during your procedure. Summary  A dialysis fistulogram is an X-ray test to look inside the site where blood is removed and returned to your body during dialysis.  If a problem is found, your health care provider can also do treatments during the procedure to clear the blocked or narrowed areas. This information is not intended to replace advice given to you by your health care provider. Make sure you discuss any questions you have with your health care provider. Document Released: 11/30/2013 Document Revised: 08/16/2017 Document Reviewed: 08/16/2017 Elsevier Interactive Patient Education  2019 [employer].

## 2018-08-08 NOTE — Procedures (Signed)
Interventional Radiology Procedure Note  Procedure: Declot of right arm straight AVG with placement of an 8 x 60 Covera stent graft.   Complications: None  Estimated Blood Loss: None  Recommendations: - DC Home  Signed,  Criselda Peaches, MD

## 2018-08-08 NOTE — H&P (Signed)
Chief Complaint: Patient was seen in consultation today for right arm dialysis graft declot at the request of [place]  Referring Physician(s): Fran Lowes  Supervising Physician: Jacqulynn Cadet  Patient Status: [facility] - Out-pt  History of Present Illness: Kara Knapp is a 48 y.o. female   Right upper arm dialysis graft placed 2017 Worked well for few years Has had trouble last 6 mo shuntograms and declot at [facility]-- approx 6 months last intervention  No interventions with IR  Last dialysis Tues this week-- was fine Thursday-- dialysis Ctr noted slow flow-- and clotted  Scheduled now for thrombolysis/ angioplasty/stent placement Denies recent surgery No blood thinners Denies any source of bleeding  Past Medical History:  Diagnosis Date  . Anemia of chronic disease   . Asthma   . Blind left eye   . Bronchitis   . Cataract   . Cholecystitis, acute 05/26/2013   Status post cholecystectomy  . Chronic abdominal pain   . Chronic diarrhea   . Depression   . Diabetic foot ulcer ([place]) 03/01/2015  . Diastolic heart failure ([place])   . ESRD on hemodialysis ([place])    Started diaylsis 12/29/15  . Essential hypertension   . Fibroids   . Glaucoma   . History of blood transfusion   . History of pneumonia   . Hyperlipidemia   . Insulin-dependent diabetes mellitus with retinopathy ([place])   . Neuropathy   . Osteomyelitis ([place])    Toe on left foot    Past Surgical History:  Procedure Laterality Date  . A/V SHUNTOGRAM N/A 10/25/2016   Procedure: A/V Shuntogram - Right Arm;  Surgeon: Waynetta Sandy, MD;  Location: [facility];  Service: Cardiovascular;  Laterality: N/A;  . A/V SHUNTOGRAM N/A 03/05/2018   Procedure: A/V SHUNTOGRAM - Right Arm;  Surgeon: Waynetta Sandy, MD;  Location: [facility];  Service: Cardiovascular;  Laterality: N/A;  . AV FISTULA PLACEMENT Right 10/17/2015   Procedure: INSERTION OF ARTERIOVENOUS  GORE-TEX GRAFT RIGHT UPPER ARM WITH ACUSEAL;  Surgeon: Conrad Martha, MD;  Location: [facility];  Service: Vascular;  Laterality: Right;  . CATARACT EXTRACTION W/ INTRAOCULAR LENS IMPLANT Bilateral   . CESAREAN SECTION    . CHOLECYSTECTOMY N/A 05/25/2013   Procedure: LAPAROSCOPIC CHOLECYSTECTOMY;  Surgeon: Jamesetta So, MD;  Location: [facility];  Service: General;  Laterality: N/A;  . COLONOSCOPY WITH PROPOFOL N/A 10/02/2016   Procedure: COLONOSCOPY WITH PROPOFOL;  Surgeon: Danie Binder, MD;  [facility];  Service: Endoscopy;  Laterality: N/A;  145 - pt knows to arrive at 11:15 per office  . ESOPHAGOGASTRODUODENOSCOPY (EGD) WITH PROPOFOL N/A 10/02/2016   Procedure: ESOPHAGOGASTRODUODENOSCOPY (EGD) WITH PROPOFOL;  Surgeon: Danie Binder, MD;  [facility];  Service: Endoscopy;  Laterality: N/A;  . EYE SURGERY Bilateral   . PARS PLANA VITRECTOMY Left 11/24/2014   Procedure: PARS PLANA VITRECTOMY WITH 25 GAUGE;  Surgeon: Hurman Horn, MD;  Location: [facility];  Service: Ophthalmology;  Laterality: Left;  . PERIPHERAL VASCULAR BALLOON ANGIOPLASTY Right 03/05/2018   Procedure: PERIPHERAL VASCULAR BALLOON ANGIOPLASTY;  Surgeon: Waynetta Sandy, MD;  Location: [facility];  Service: Cardiovascular;  Laterality: Right;  arm fistula  . PERIPHERAL VASCULAR CATHETERIZATION N/A 04/28/2015   Procedure: Bilateral Upper Extremity Venography;  Surgeon: Conrad [place], MD;  Location: [facility];  Service: Cardiovascular;  Laterality: N/A;  . PHOTOCOAGULATION WITH LASER Left 11/24/2014   Procedure: PHOTOCOAGULATION WITH LASER;  Surgeon: Hurman Horn, MD;  Location: [facility];  Service: Ophthalmology;  Laterality: Left;  with insertion of silicone oil  . SAVORY DILATION N/A 10/02/2016   Procedure: SAVORY DILATION;  Surgeon: Danie Binder, MD;  [facility];  Service: Endoscopy;  Laterality: N/A;  . TUBAL LIGATION      Allergies: Bactrim [sulfamethoxazole-trimethoprim] and  Prednisone  Medications: Prior to Admission medications   Medication Sig Start Date End Date Taking? Authorizing Provider  aspirin 81 MG tablet Take 81 mg by mouth daily.   Yes [provider]  brimonidine-timolol (COMBIGAN) 0.2-0.5 % ophthalmic solution Place 1 drop into both eyes daily.    Yes [provider]  carvedilol (COREG) 12.5 MG tablet Take 1 tablet (12.5 mg total) by mouth 2 (two) times daily. 05/14/18  Yes Dettinger, Fransisca Kaufmann, MD  cholecalciferol (VITAMIN D) 1000 units tablet Take 1,000 Units by mouth daily.    Yes [provider]  gabapentin (NEURONTIN) 100 MG capsule TAKE ONE CAPSULE BY MOUTH THREE TIMES DAILY 05/26/18  Yes Dettinger, Fransisca Kaufmann, MD  Insulin Glargine (LANTUS SOLOSTAR) 100 UNIT/ML Solostar Pen Inject 14 Units into the skin at bedtime. Patient taking differently: Inject 18 Units into the skin at bedtime.  02/14/18  Yes Nida, Marella Chimes, MD  lidocaine-prilocaine (EMLA) cream Apply 1 application topically daily as needed (arm port).   Yes [provider]  pantoprazole (PROTONIX) 40 MG tablet TAKE 1 BY MOUTH DAILY 30 minutes before breakfast 01/29/18  Yes Annitta Needs, NP  sertraline (ZOLOFT) 100 MG tablet Take 1 tablet (100 mg total) by mouth daily. 07/14/18  Yes [name], MD  sevelamer carbonate (RENVELA) 800 MG tablet Take 2,400 mg by mouth See admin instructions. Take 2465m by mouth three times daily with means and 8039mwith snacks   Yes [provider]  TRADJENTA 5 MG TABS tablet TAKE ONE TABLET BY MOUTH DAILY 05/27/18  Yes Nida, GeMarella ChimesMD  traZODone (DESYREL) 50 MG tablet TAKE 1/2 TO 1 TABLET BY MOUTH AT BEDTIME AS NEEDED FOR SLEEP. Patient taking differently: Take 50-100 mg by mouth at bedtime as needed for sleep.  06/27/18  Yes Dettinger, JoFransisca KaufmannMD  Continuous Blood Gluc Sensor (FREESTYLE LIBRE 14 DAY SENSOR) MISC Inject 1 each into the skin every 14 (fourteen) days. Use as directed. 11/27/17   NiCassandria AngerMD  glucose blood (PRODIGY NO CODING BLOOD GLUC) test strip USE TO CHECK BLOOD SUGAR TWICE DAILY. 10/28/17   BrTimmothy EulerMD  Insulin Pen Needle (PEN NEEDLES) 31G X 5 MM MISC 1 Device by Does not apply route daily. 05/19/18   Dettinger, JoFransisca KaufmannMD  PRODIGY TWIST TOP LANCETS 28G MISC USE TO CHECK BLOOD SUGAR UP TO FOUR TIMES DAILY. 03/13/17   BrTimmothy EulerMD     Family History  Problem Relation Age of Onset  . COPD Mother   . Cancer Father   . Lymphoma Father   . Diabetes Sister   . Deep vein thrombosis Sister   . Diabetes Brother   . Hyperlipidemia Brother   . Hypertension Brother   . Mental retardation Sister   . Alcohol abuse Paternal Grandmother   . Colon cancer Neg Hx   . Liver disease Neg Hx     Social History   Socioeconomic History  . Marital status: Single    Spouse name: Not on file  . Number of children: 5  . Years of education: GED  .  Highest education level: Not on file  Occupational History  . Not on file  Social Needs  . Financial resource strain: Not on file  . Food insecurity:    Worry: Not on file    Inability: Not on file  . Transportation needs:    Medical: Not on file    Non-medical: Not on file  Tobacco Use  . Smoking status: Current Every Day Smoker    Packs/day: 1.50    Years: 23.00    Pack years: 34.50    Types: Cigarettes  . Smokeless tobacco: Never Used  . Tobacco comment: one pack daily  Substance and Sexual Activity  . Alcohol use: No    Alcohol/week: 0.0 standard drinks    Frequency: Never  . Drug use: No    Comment: Sober for 8 years  . Sexual activity: Not Currently    Birth control/protection: Surgical  Lifestyle  . Physical activity:    Days per week: Not on file    Minutes per session: Not on file  . Stress: Not on file  Relationships  . Social connections:    Talks on phone: Not on file    Gets together: Not on file    Attends religious service: Not on file    Active member of club or  organization: Not on file    Attends meetings of clubs or organizations: Not on file    Relationship status: Not on file  Other Topics Concern  . Not on file  Social History Narrative  . Not on file    Review of Systems: A 12 point ROS discussed and pertinent positives are indicated in the HPI above.  All other systems are negative.  Review of Systems  Constitutional: Negative for activity change, fatigue and fever.  Respiratory: Negative for cough and shortness of breath.   Cardiovascular: Negative for chest pain.  Gastrointestinal: Negative for abdominal pain.  Neurological: Positive for weakness.  Psychiatric/Behavioral: Negative for behavioral problems and confusion.    Vital Signs: BP (!) 142/80   Pulse 64   Temp 97.9 F (36.6 C)   Resp 18   Ht 5' 3"  (1.6 m)   Wt 165 lb (74.8 kg)   LMP 05/05/2015   SpO2 99%   BMI 29.23 kg/m   Physical Exam Vitals signs reviewed.  Cardiovascular:     Rate and Rhythm: Normal rate and regular rhythm.     Heart sounds: Normal heart sounds.  Pulmonary:     Effort: Pulmonary effort is normal.     Breath sounds: Normal breath sounds.  Abdominal:     General: Bowel sounds are normal.     Palpations: Abdomen is soft.     Tenderness: There is no abdominal tenderness.  Musculoskeletal: Normal range of motion.     Comments: Right upper arm dialysis graft +pulse No thrill  Skin:    General: Skin is warm and dry.  Neurological:     General: No focal deficit present.     Mental Status: She is alert and oriented to person, place, and time.  Psychiatric:        Mood and Affect: Mood normal.        Behavior: Behavior normal.        Thought Content: Thought content normal.        Judgment: Judgment normal.     Imaging: No results found.  Labs:  CBC: Recent Labs    02/07/18 1138 02/10/18 1517 02/28/18 1019 03/05/18 0904 05/14/18 1357  WBC 7.7 7.3 8.1  --  8.1  HGB 11.7* 10.4* 11.7* 11.6* 12.1  HCT 35.3* 29.5* 33.9* 34.0*  35.9  PLT 191 187 213  --  171    COAGS: Recent Labs    09/23/17 1119 02/07/18 1138  INR 1.0 0.99    BMP: Recent Labs    09/27/17 1030 02/10/18 1517 02/28/18 1019 03/05/18 0904 05/14/18 1357  NA 136 130* 134* 136 138  K 4.2 4.7 3.5 3.3* 5.1  CL 95* 88* 94* 93* 95*  CO2 24 24 28   --  23  GLUCOSE 260* 251* 140* 123* 132*  BUN 24 44* 30* 29* 29*  CALCIUM 9.0 8.8 9.2  --  8.7  CREATININE 3.05* 6.56* 4.94* 5.00* 4.57*  GFRNONAA 17* 7* 10*  --  11*  GFRAA 20* 8* 11*  --  12*    LIVER FUNCTION TESTS: Recent Labs    09/07/17 1028 09/23/17 1119 02/28/18 1019 05/14/18 1357  BILITOT 0.8 0.5 0.6 0.3  AST 31 17 39 26  ALT 23 13 44 25  ALKPHOS 343*  --  318* 291*  PROT 6.5 6.1 8.0 7.3  ALBUMIN 3.2*  --  4.0 4.4    TUMOR MARKERS: No results for input(s): AFPTM, CEA, CA199, CHROMGRNA in the last 8760 hours.    Assessment and Plan:  ESRD Right arm dialysis graft clotted Scheduled for thrombolysis/ angioplasty/stnet placement Possible tunneled dialysis catheter placement Risks and benefits discussed with the patient including, but not limited to bleeding, infection, vascular injury, pulmonary embolism, need for tunneled HD catheter placement or even death.  All of the patient's questions were answered, patient is agreeable to proceed. Consent signed and in chart.  Thank you for this interesting consult.  I greatly enjoyed meeting Kara Knapp and look forward to participating in their care.  A copy of this report was sent to the requesting provider on this date.  Electronically Signed: Lavonia Drafts, PA-C 08/08/2018, 8:41 AM   I spent a total of  30 Minutes   in face to face in clinical consultation, greater than 50% of which was counseling/coordinating care for right arm dialysis graft declot

## 2018-08-09 DIAGNOSIS — D631 Anemia in chronic kidney disease: Secondary | ICD-10-CM | POA: Diagnosis not present

## 2018-08-09 DIAGNOSIS — Z992 Dependence on renal dialysis: Secondary | ICD-10-CM | POA: Diagnosis not present

## 2018-08-09 DIAGNOSIS — N250 Renal osteodystrophy: Secondary | ICD-10-CM | POA: Diagnosis not present

## 2018-08-09 DIAGNOSIS — D509 Iron deficiency anemia, unspecified: Secondary | ICD-10-CM | POA: Diagnosis not present

## 2018-08-09 DIAGNOSIS — N186 End stage renal disease: Secondary | ICD-10-CM | POA: Diagnosis not present

## 2018-08-11 DIAGNOSIS — N186 End stage renal disease: Secondary | ICD-10-CM | POA: Diagnosis not present

## 2018-08-11 DIAGNOSIS — Z992 Dependence on renal dialysis: Secondary | ICD-10-CM | POA: Diagnosis not present

## 2018-08-11 DIAGNOSIS — E8770 Fluid overload, unspecified: Secondary | ICD-10-CM | POA: Diagnosis not present

## 2018-08-12 DIAGNOSIS — N250 Renal osteodystrophy: Secondary | ICD-10-CM | POA: Diagnosis not present

## 2018-08-12 DIAGNOSIS — D631 Anemia in chronic kidney disease: Secondary | ICD-10-CM | POA: Diagnosis not present

## 2018-08-12 DIAGNOSIS — D509 Iron deficiency anemia, unspecified: Secondary | ICD-10-CM | POA: Diagnosis not present

## 2018-08-12 DIAGNOSIS — N186 End stage renal disease: Secondary | ICD-10-CM | POA: Diagnosis not present

## 2018-08-12 DIAGNOSIS — Z992 Dependence on renal dialysis: Secondary | ICD-10-CM | POA: Diagnosis not present

## 2018-08-14 DIAGNOSIS — Z992 Dependence on renal dialysis: Secondary | ICD-10-CM | POA: Diagnosis not present

## 2018-08-14 DIAGNOSIS — D631 Anemia in chronic kidney disease: Secondary | ICD-10-CM | POA: Diagnosis not present

## 2018-08-14 DIAGNOSIS — N250 Renal osteodystrophy: Secondary | ICD-10-CM | POA: Diagnosis not present

## 2018-08-14 DIAGNOSIS — D509 Iron deficiency anemia, unspecified: Secondary | ICD-10-CM | POA: Diagnosis not present

## 2018-08-14 DIAGNOSIS — N186 End stage renal disease: Secondary | ICD-10-CM | POA: Diagnosis not present

## 2018-08-15 ENCOUNTER — Ambulatory Visit: Payer: Medicare Other | Admitting: Family Medicine

## 2018-08-16 DIAGNOSIS — N186 End stage renal disease: Secondary | ICD-10-CM | POA: Diagnosis not present

## 2018-08-16 DIAGNOSIS — D509 Iron deficiency anemia, unspecified: Secondary | ICD-10-CM | POA: Diagnosis not present

## 2018-08-16 DIAGNOSIS — D631 Anemia in chronic kidney disease: Secondary | ICD-10-CM | POA: Diagnosis not present

## 2018-08-16 DIAGNOSIS — Z992 Dependence on renal dialysis: Secondary | ICD-10-CM | POA: Diagnosis not present

## 2018-08-16 DIAGNOSIS — N250 Renal osteodystrophy: Secondary | ICD-10-CM | POA: Diagnosis not present

## 2018-08-18 ENCOUNTER — Ambulatory Visit: Payer: Medicare Other | Admitting: Family Medicine

## 2018-08-18 ENCOUNTER — Other Ambulatory Visit (HOSPITAL_COMMUNITY): Payer: Self-pay | Admitting: Psychiatry

## 2018-08-18 MED ORDER — SERTRALINE HCL 100 MG PO TABS: 100.0000 mg | ORAL_TABLET | Freq: Every day | ORAL | 0 refills | Status: DC

## 2018-08-19 DIAGNOSIS — D631 Anemia in chronic kidney disease: Secondary | ICD-10-CM | POA: Diagnosis not present

## 2018-08-19 DIAGNOSIS — N250 Renal osteodystrophy: Secondary | ICD-10-CM | POA: Diagnosis not present

## 2018-08-19 DIAGNOSIS — Z992 Dependence on renal dialysis: Secondary | ICD-10-CM | POA: Diagnosis not present

## 2018-08-19 DIAGNOSIS — D509 Iron deficiency anemia, unspecified: Secondary | ICD-10-CM | POA: Diagnosis not present

## 2018-08-19 DIAGNOSIS — N186 End stage renal disease: Secondary | ICD-10-CM | POA: Diagnosis not present

## 2018-08-21 DIAGNOSIS — D509 Iron deficiency anemia, unspecified: Secondary | ICD-10-CM | POA: Diagnosis not present

## 2018-08-21 DIAGNOSIS — N250 Renal osteodystrophy: Secondary | ICD-10-CM | POA: Diagnosis not present

## 2018-08-21 DIAGNOSIS — Z992 Dependence on renal dialysis: Secondary | ICD-10-CM | POA: Diagnosis not present

## 2018-08-21 DIAGNOSIS — D631 Anemia in chronic kidney disease: Secondary | ICD-10-CM | POA: Diagnosis not present

## 2018-08-21 DIAGNOSIS — N186 End stage renal disease: Secondary | ICD-10-CM | POA: Diagnosis not present

## 2018-08-22 ENCOUNTER — Other Ambulatory Visit: Payer: Self-pay | Admitting: Family Medicine

## 2018-08-23 DIAGNOSIS — D509 Iron deficiency anemia, unspecified: Secondary | ICD-10-CM | POA: Diagnosis not present

## 2018-08-23 DIAGNOSIS — N186 End stage renal disease: Secondary | ICD-10-CM | POA: Diagnosis not present

## 2018-08-23 DIAGNOSIS — N250 Renal osteodystrophy: Secondary | ICD-10-CM | POA: Diagnosis not present

## 2018-08-23 DIAGNOSIS — Z992 Dependence on renal dialysis: Secondary | ICD-10-CM | POA: Diagnosis not present

## 2018-08-23 DIAGNOSIS — D631 Anemia in chronic kidney disease: Secondary | ICD-10-CM | POA: Diagnosis not present

## 2018-08-26 ENCOUNTER — Telehealth: Payer: Self-pay | Admitting: Gastroenterology

## 2018-08-26 DIAGNOSIS — D631 Anemia in chronic kidney disease: Secondary | ICD-10-CM | POA: Diagnosis not present

## 2018-08-26 DIAGNOSIS — Z992 Dependence on renal dialysis: Secondary | ICD-10-CM | POA: Diagnosis not present

## 2018-08-26 DIAGNOSIS — N186 End stage renal disease: Secondary | ICD-10-CM | POA: Diagnosis not present

## 2018-08-26 DIAGNOSIS — D509 Iron deficiency anemia, unspecified: Secondary | ICD-10-CM | POA: Diagnosis not present

## 2018-08-26 DIAGNOSIS — N250 Renal osteodystrophy: Secondary | ICD-10-CM | POA: Diagnosis not present

## 2018-08-26 NOTE — Telephone Encounter (Signed)
Looks like the only thing we had ordered was the Ferritin that Walden Field, NP ordered on 06/30/2018.  I have printed and leaving at front for her to pick up on Friday.

## 2018-08-26 NOTE — Telephone Encounter (Signed)
Pt requested lab orders to be printed off for her and she was sending someone to the office on Friday to pick them up.

## 2018-08-27 DIAGNOSIS — H4720 Unspecified optic atrophy: Secondary | ICD-10-CM | POA: Diagnosis not present

## 2018-08-27 DIAGNOSIS — E113592 Type 2 diabetes mellitus with proliferative diabetic retinopathy without macular edema, left eye: Secondary | ICD-10-CM | POA: Diagnosis not present

## 2018-08-27 DIAGNOSIS — E113511 Type 2 diabetes mellitus with proliferative diabetic retinopathy with macular edema, right eye: Secondary | ICD-10-CM | POA: Diagnosis not present

## 2018-08-28 DIAGNOSIS — N250 Renal osteodystrophy: Secondary | ICD-10-CM | POA: Diagnosis not present

## 2018-08-28 DIAGNOSIS — Z992 Dependence on renal dialysis: Secondary | ICD-10-CM | POA: Diagnosis not present

## 2018-08-28 DIAGNOSIS — D509 Iron deficiency anemia, unspecified: Secondary | ICD-10-CM | POA: Diagnosis not present

## 2018-08-28 DIAGNOSIS — D631 Anemia in chronic kidney disease: Secondary | ICD-10-CM | POA: Diagnosis not present

## 2018-08-28 DIAGNOSIS — N186 End stage renal disease: Secondary | ICD-10-CM | POA: Diagnosis not present

## 2018-08-29 ENCOUNTER — Ambulatory Visit (INDEPENDENT_AMBULATORY_CARE_PROVIDER_SITE_OTHER): Payer: Medicare Other | Admitting: Family Medicine

## 2018-08-29 ENCOUNTER — Telehealth: Payer: Self-pay | Admitting: Family Medicine

## 2018-08-29 ENCOUNTER — Encounter: Payer: Self-pay | Admitting: Family Medicine

## 2018-08-29 VITALS — BP 140/84 | HR 68 | Temp 97.7°F | Ht 63.0 in | Wt 173.6 lb

## 2018-08-29 DIAGNOSIS — Z992 Dependence on renal dialysis: Secondary | ICD-10-CM

## 2018-08-29 DIAGNOSIS — F331 Major depressive disorder, recurrent, moderate: Secondary | ICD-10-CM | POA: Diagnosis not present

## 2018-08-29 DIAGNOSIS — R0981 Nasal congestion: Secondary | ICD-10-CM

## 2018-08-29 DIAGNOSIS — N186 End stage renal disease: Secondary | ICD-10-CM

## 2018-08-29 DIAGNOSIS — E782 Mixed hyperlipidemia: Secondary | ICD-10-CM | POA: Diagnosis not present

## 2018-08-29 DIAGNOSIS — E1142 Type 2 diabetes mellitus with diabetic polyneuropathy: Secondary | ICD-10-CM

## 2018-08-29 DIAGNOSIS — I10 Essential (primary) hypertension: Secondary | ICD-10-CM | POA: Diagnosis not present

## 2018-08-29 DIAGNOSIS — E1122 Type 2 diabetes mellitus with diabetic chronic kidney disease: Secondary | ICD-10-CM | POA: Diagnosis not present

## 2018-08-29 LAB — BAYER DCA HB A1C WAIVED: HB A1C (BAYER DCA - WAIVED): 7.2 % — ABNORMAL HIGH (ref <7.0)

## 2018-08-29 MED ORDER — TRAMADOL HCL 50 MG PO TABS: 50.0000 mg | ORAL_TABLET | Freq: Every day | ORAL | 2 refills | Status: DC | PRN

## 2018-08-29 MED ORDER — LINAGLIPTIN 5 MG PO TABS: 5.0000 mg | ORAL_TABLET | Freq: Every day | ORAL | 3 refills | Status: DC

## 2018-08-29 MED ORDER — AZELASTINE HCL 0.1 % NA SOLN: 1.0000 | Freq: Two times a day (BID) | NASAL | 12 refills | Status: DC

## 2018-08-29 MED ORDER — CARVEDILOL 12.5 MG PO TABS: 12.5000 mg | ORAL_TABLET | Freq: Two times a day (BID) | ORAL | 5 refills | Status: DC

## 2018-08-29 MED ORDER — INSULIN GLARGINE 100 UNIT/ML SOLOSTAR PEN: 18.0000 [IU] | PEN_INJECTOR | Freq: Every day | SUBCUTANEOUS | 11 refills | Status: DC

## 2018-08-29 MED ORDER — GABAPENTIN 100 MG PO CAPS: 100.0000 mg | ORAL_CAPSULE | Freq: Three times a day (TID) | ORAL | 3 refills | Status: DC

## 2018-08-29 NOTE — Patient Instructions (Signed)
Recommended Astelin and Mucinex

## 2018-08-29 NOTE — Telephone Encounter (Signed)
I sent the tramadol for her but have her use it sparingly so she does not have too many issues with it.

## 2018-08-29 NOTE — Telephone Encounter (Signed)
Patient forgot to tell you about her hand pain. She states that she has seen a specialist and she has trigger finger in right hand and arthritis in both hands. They do not want her taking tylenol or Ibuprofen and they told her to see her PCP for pain medications. She is requesting tramadol be sent to [facility]. Please review and advise

## 2018-08-29 NOTE — Telephone Encounter (Signed)
Aware  Medicine  Sent in.

## 2018-08-29 NOTE — Progress Notes (Signed)
BP 140/84   Pulse 68   Temp 97.7 F (36.5 C) (Oral)   Ht 5' 3"  (1.6 m)   Wt 173 lb 9.6 oz (78.7 kg)   LMP 05/05/2015   BMI 30.75 kg/m    Subjective:    Patient ID: Kara Knapp, female    DOB: 12/25/1970, 48 y.o.   MRN: [id number]  HPI: Kara Knapp is a 48 y.o. female presenting on 08/29/2018 for Diabetes (3 month follow up); Hyperlipidemia; Sinus Problem (Patient states that the flonase does not help);  and Rash (back of neck. Patient states that it has been there a few weeks and she thinks it is from her scarf)   HPI Type 2 diabetes mellitus Patient comes in today for recheck of his diabetes. Patient has been currently taking Lantus 18 and Tradjenta. Patient is not currently on an ACE inhibitor/ARB. Patient has not seen an ophthalmologist this year because she is legally blind. Patient has neuropathy that is controlled.  Patient also has end-stage renal disease due to her diabetes which is being managed by nephrology  Hyperlipidemia Patient is coming in for recheck of his hyperlipidemia. The patient is currently taking no medication currently and we are monitoring for now for cholesterol. They deny any issues with myalgias or history of liver damage from it. They deny any focal numbness or weakness or chest pain.   Hypertension Patient is currently on carvedilol, and their blood pressure today is 140/84. Patient denies any lightheadedness or dizziness. Patient denies headaches, blurred vision, chest pains, shortness of breath, or weakness. Denies any side effects from medication and is content with current medication.   Sinus congestion and pressure Patient comes in complaining of sinus congestion and pressure and drainage that is been.  She denies any shortness of breath or fevers or chills.  She has been having postnasal drainage and congestion is been going on for a month and a half and she is not worsening or getting better.  She has not been using anything too much for it  except occasional Tylenol.  She says she started to get phlegm and congestion more over the past couple weeks.  She does admit that she is still smoking and knows that is probably because of this.  Patient has a small rash in the back of her neck which she thinks is irritation from a scarf that she wore and she is wondering what she can use.  Recommended for her to use cortisone cream  Relevant past medical, surgical, family and social history reviewed and updated as indicated. Interim medical history since our last visit reviewed. Allergies and medications reviewed and updated.  Review of Systems  Constitutional: Negative for chills and fever.  HENT: Positive for congestion, postnasal drip, rhinorrhea, sinus pressure, sneezing and sore throat. Negative for ear discharge and ear pain.   Eyes: Negative for pain, redness and visual disturbance.  Respiratory: Positive for cough. Negative for chest tightness and shortness of breath.   Cardiovascular: Negative for chest pain and leg swelling.  Musculoskeletal: Negative for back pain and gait problem.  Skin: Positive for rash.  Neurological: Negative for light-headedness and headaches.  Psychiatric/Behavioral: Negative for agitation and behavioral problems.  All other systems reviewed and are negative.   Per HPI unless specifically indicated above   Allergies as of 08/29/2018      Reactions   Bactrim [sulfamethoxazole-trimethoprim] Nausea And Vomiting   Prednisone Other (See Comments)   "I was wide open and couldn't eat" per  pt.       Medication List       Accurate as of August 29, 2018  2:02 PM. Always use your most recent med list.        aspirin 81 MG tablet Take 81 mg by mouth daily.   azelastine 0.1 % nasal spray Commonly known as:  ASTELIN Place 1 spray into both nostrils 2 (two) times daily. Use in each nostril as directed   carvedilol 12.5 MG tablet Commonly known as:  COREG Take 1 tablet (12.5 mg total) by mouth 2  (two) times daily.   cholecalciferol 1000 units tablet Commonly known as:  VITAMIN D Take 1,000 Units by mouth daily.   COMBIGAN 0.2-0.5 % ophthalmic solution Generic drug:  brimonidine-timolol Place 1 drop into both eyes daily.   FREESTYLE LIBRE 14 DAY SENSOR Misc Inject 1 each into the skin every 14 (fourteen) days. Use as directed.   gabapentin 100 MG capsule Commonly known as:  NEURONTIN Take 1 capsule (100 mg total) by mouth 3 (three) times daily.   glucose blood test strip Commonly known as:  PRODIGY NO CODING BLOOD GLUC USE TO CHECK BLOOD SUGAR TWICE DAILY.   Insulin Glargine 100 UNIT/ML Solostar Pen Commonly known as:  LANTUS SOLOSTAR Inject 18-50 Units into the skin at bedtime.   lidocaine-prilocaine cream Commonly known as:  EMLA Apply 1 application topically daily as needed (arm port).   linagliptin 5 MG Tabs tablet Commonly known as:  TRADJENTA Take 1 tablet (5 mg total) by mouth daily.   pantoprazole 40 MG tablet Commonly known as:  PROTONIX TAKE 1 BY MOUTH DAILY 30 minutes before breakfast   Pen Needles 31G X 5 MM Misc 1 Device by Does not apply route daily.   PRODIGY TWIST TOP LANCETS 28G Misc USE TO CHECK BLOOD SUGAR UP TO FOUR TIMES DAILY.   sertraline 100 MG tablet Commonly known as:  ZOLOFT Take 1 tablet (100 mg total) by mouth daily. Start taking on:  September 14, 2018   sevelamer carbonate 800 MG tablet Commonly known as:  RENVELA Take 2,400 mg by mouth See admin instructions. Take 2496m by mouth three times daily with means and 8036mwith snacks   traZODone 50 MG tablet Commonly known as:  DESYREL TAKE 1/2 TO 1 TABLET BY MOUTH AT BEDTIME AS NEEDED FOR SLEEP.          Objective:    BP 140/84   Pulse 68   Temp 97.7 F (36.5 C) (Oral)   Ht 5' 3"  (1.6 m)   Wt 173 lb 9.6 oz (78.7 kg)   LMP 05/05/2015   BMI 30.75 kg/m   Wt Readings from Last 3 Encounters:  08/29/18 173 lb 9.6 oz (78.7 kg)  08/08/18 165 lb (74.8 kg)  06/30/18  170 lb 3.2 oz (77.2 kg)    Physical Exam Vitals signs and nursing note reviewed.  Constitutional:      General: She is not in acute distress.    Appearance: She is well-developed. She is not diaphoretic.  HENT:     Right Ear: Tympanic membrane, ear canal and external ear normal.     Left Ear: Tympanic membrane, ear canal and external ear normal.     Nose: Mucosal edema and rhinorrhea present.     Right Sinus: No maxillary sinus tenderness or frontal sinus tenderness.     Left Sinus: No maxillary sinus tenderness or frontal sinus tenderness.     Mouth/Throat:     Pharynx: Uvula  midline. Posterior oropharyngeal erythema present. No oropharyngeal exudate.     Tonsils: No tonsillar abscesses.  Eyes:     Conjunctiva/sclera: Conjunctivae normal.     Pupils: Pupils are equal, round, and reactive to light.  Cardiovascular:     Rate and Rhythm: Normal rate and regular rhythm.     Heart sounds: Normal heart sounds. No murmur.  Pulmonary:     Effort: Pulmonary effort is normal. No respiratory distress.     Breath sounds: Normal breath sounds. No wheezing.  Musculoskeletal: Normal range of motion.        General: No tenderness.  Skin:    General: Skin is warm and dry.     Findings: No rash.  Neurological:     Mental Status: She is alert and oriented to person, place, and time.     Coordination: Coordination normal.  Psychiatric:        Behavior: Behavior normal.         Assessment & Plan:   Problem List Items Addressed This Visit      Cardiovascular and Mediastinum   Essential hypertension, benign   Relevant Medications   carvedilol (COREG) 12.5 MG tablet   Other Relevant Orders   CMP14+EGFR     Endocrine   Type 2 diabetes mellitus with ESRD (end-stage renal disease) (HCC) - Primary   Relevant Medications   Insulin Glargine (LANTUS SOLOSTAR) 100 UNIT/ML Solostar Pen   linagliptin (TRADJENTA) 5 MG TABS tablet   Other Relevant Orders   Bayer DCA Hb A1c Waived   CMP14+EGFR     Diabetic neuropathy (HCC)   Relevant Medications   Insulin Glargine (LANTUS SOLOSTAR) 100 UNIT/ML Solostar Pen   linagliptin (TRADJENTA) 5 MG TABS tablet     Genitourinary   ESRD on dialysis (HCC)     Other   Mixed hyperlipidemia   Relevant Medications   carvedilol (COREG) 12.5 MG tablet   MDD (major depressive disorder), recurrent episode, moderate (HCC)    Other Visit Diagnoses    Sinus congestion       Recommended Astelin and Mucinex   Relevant Medications   azelastine (ASTELIN) 0.1 % nasal spray       Follow up plan: Return in about 3 months (around 11/27/2018), or if symptoms worsen or fail to improve, for Diabetes recheck.  Counseling provided for all of the vaccine components Orders Placed This Encounter  Procedures  . Bayer DCA Hb A1c Waived  . [place] Saleen Peden, MD [facility] 08/29/2018, 2:02 PM

## 2018-08-30 DIAGNOSIS — N186 End stage renal disease: Secondary | ICD-10-CM | POA: Diagnosis not present

## 2018-08-30 DIAGNOSIS — Z992 Dependence on renal dialysis: Secondary | ICD-10-CM | POA: Diagnosis not present

## 2018-08-30 DIAGNOSIS — D631 Anemia in chronic kidney disease: Secondary | ICD-10-CM | POA: Diagnosis not present

## 2018-08-30 DIAGNOSIS — N250 Renal osteodystrophy: Secondary | ICD-10-CM | POA: Diagnosis not present

## 2018-09-01 DIAGNOSIS — E8770 Fluid overload, unspecified: Secondary | ICD-10-CM | POA: Diagnosis not present

## 2018-09-01 DIAGNOSIS — N186 End stage renal disease: Secondary | ICD-10-CM | POA: Diagnosis not present

## 2018-09-01 DIAGNOSIS — Z992 Dependence on renal dialysis: Secondary | ICD-10-CM | POA: Diagnosis not present

## 2018-09-01 DIAGNOSIS — E8779 Other fluid overload: Secondary | ICD-10-CM | POA: Diagnosis not present

## 2018-09-02 DIAGNOSIS — Z992 Dependence on renal dialysis: Secondary | ICD-10-CM | POA: Diagnosis not present

## 2018-09-02 DIAGNOSIS — N250 Renal osteodystrophy: Secondary | ICD-10-CM | POA: Diagnosis not present

## 2018-09-02 DIAGNOSIS — D631 Anemia in chronic kidney disease: Secondary | ICD-10-CM | POA: Diagnosis not present

## 2018-09-02 DIAGNOSIS — N186 End stage renal disease: Secondary | ICD-10-CM | POA: Diagnosis not present

## 2018-09-02 LAB — CMP14+EGFR
A/G RATIO: 1.4 (ref 1.2–2.2)
ALT: 12 IU/L (ref 0–32)
AST: 8 IU/L (ref 0–40)
Albumin: 4.2 g/dL (ref 3.8–4.8)
Alkaline Phosphatase: 289 IU/L — ABNORMAL HIGH (ref 39–117)
BUN/Creatinine Ratio: 7 — ABNORMAL LOW (ref 9–23)
BUN: 40 mg/dL — ABNORMAL HIGH (ref 6–24)
Bilirubin Total: 0.3 mg/dL (ref 0.0–1.2)
CALCIUM: 9 mg/dL (ref 8.7–10.2)
CO2: 22 mmol/L (ref 20–29)
Chloride: 93 mmol/L — ABNORMAL LOW (ref 96–106)
Creatinine, Ser: 5.99 mg/dL (ref 0.57–1.00)
GFR calc Af Amer: 9 mL/min/{1.73_m2} — ABNORMAL LOW (ref >59)
GFR calc non Af Amer: 8 mL/min/{1.73_m2} — ABNORMAL LOW (ref >59)
Globulin, Total: 3 g/dL (ref 1.5–4.5)
Glucose: 162 mg/dL — ABNORMAL HIGH (ref 65–99)
Potassium: 4.6 mmol/L (ref 3.5–5.2)
Sodium: 136 mmol/L (ref 134–144)
Total Protein: 7.2 g/dL (ref 6.0–8.5)

## 2018-09-04 DIAGNOSIS — Z992 Dependence on renal dialysis: Secondary | ICD-10-CM | POA: Diagnosis not present

## 2018-09-04 DIAGNOSIS — N186 End stage renal disease: Secondary | ICD-10-CM | POA: Diagnosis not present

## 2018-09-04 DIAGNOSIS — D631 Anemia in chronic kidney disease: Secondary | ICD-10-CM | POA: Diagnosis not present

## 2018-09-04 DIAGNOSIS — N250 Renal osteodystrophy: Secondary | ICD-10-CM | POA: Diagnosis not present

## 2018-09-06 DIAGNOSIS — N250 Renal osteodystrophy: Secondary | ICD-10-CM | POA: Diagnosis not present

## 2018-09-06 DIAGNOSIS — N186 End stage renal disease: Secondary | ICD-10-CM | POA: Diagnosis not present

## 2018-09-06 DIAGNOSIS — D631 Anemia in chronic kidney disease: Secondary | ICD-10-CM | POA: Diagnosis not present

## 2018-09-06 DIAGNOSIS — Z992 Dependence on renal dialysis: Secondary | ICD-10-CM | POA: Diagnosis not present
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
## 2018-09-09 DIAGNOSIS — N250 Renal osteodystrophy: Secondary | ICD-10-CM | POA: Diagnosis not present

## 2018-09-09 DIAGNOSIS — D631 Anemia in chronic kidney disease: Secondary | ICD-10-CM | POA: Diagnosis not present

## 2018-09-09 DIAGNOSIS — Z992 Dependence on renal dialysis: Secondary | ICD-10-CM | POA: Diagnosis not present

## 2018-09-09 DIAGNOSIS — N186 End stage renal disease: Secondary | ICD-10-CM | POA: Diagnosis not present

## 2018-09-10 ENCOUNTER — Ambulatory Visit: Payer: Medicare Other | Admitting: Podiatry

## 2018-09-11 DIAGNOSIS — N250 Renal osteodystrophy: Secondary | ICD-10-CM | POA: Diagnosis not present

## 2018-09-11 DIAGNOSIS — D631 Anemia in chronic kidney disease: Secondary | ICD-10-CM | POA: Diagnosis not present

## 2018-09-11 DIAGNOSIS — Z992 Dependence on renal dialysis: Secondary | ICD-10-CM | POA: Diagnosis not present

## 2018-09-11 DIAGNOSIS — N186 End stage renal disease: Secondary | ICD-10-CM | POA: Diagnosis not present

## 2018-09-11 NOTE — Progress Notes (Signed)
Cardiology Office Note  Date: 09/12/2018   ID: Kara Knapp, DOB Dec 31, 1970, MRN [id number]  PCP: Dettinger, Fransisca Kaufmann, MD  Primary Cardiologist: Rozann Lesches, MD   Chief Complaint  Patient presents with  . Cardiac follow-up    History of Present Illness: Kara Knapp is a 48 y.o. female last seen in February 2019.  She is here today for a routine follow-up visit.  She tells me that since I saw her, she has been having some difficulty with low blood pressures during hemodialysis sessions.  She has already come off of hydralazine, but has continued on Coreg 12.5 mg twice daily.  She does hold her morning dose of Coreg on dialysis days.  There was no major change in status.  She follows with Dr. Lowanda Foster.  I personally reviewed her ECG today which shows sinus rhythm with low voltage in the limb leads and nonspecific T wave changes.   Past Medical History:  Diagnosis Date  . Anemia of chronic disease   . Asthma   . Blind left eye   . Bronchitis   . Cataract   . Cholecystitis, acute 05/26/2013   Status post cholecystectomy  . Chronic abdominal pain   . Chronic diarrhea   . Depression   . Diabetic foot ulcer ([place]) 03/01/2015  . Diastolic heart failure ([place])   . ESRD on hemodialysis ([place])    Started diaylsis 12/29/15  . Essential hypertension   . Fibroids   . Glaucoma   . History of blood transfusion   . History of pneumonia   . Hyperlipidemia   . Insulin-dependent diabetes mellitus with retinopathy ([place])   . Neuropathy   . Osteomyelitis ([place])    Toe on left foot    Past Surgical History:  Procedure Laterality Date  . A/V SHUNTOGRAM N/A 10/25/2016   Procedure: A/V Shuntogram - Right Arm;  Surgeon: Waynetta Sandy, MD;  Location: [facility];  Service: Cardiovascular;  Laterality: N/A;  . A/V SHUNTOGRAM N/A 03/05/2018   Procedure: A/V SHUNTOGRAM - Right Arm;  Surgeon: Waynetta Sandy, MD;  Location: [facility];  Service:  Cardiovascular;  Laterality: N/A;  . AV FISTULA PLACEMENT Right 10/17/2015   Procedure: INSERTION OF ARTERIOVENOUS GORE-TEX GRAFT RIGHT UPPER ARM WITH ACUSEAL;  Surgeon: Conrad Kimberly, MD;  Location: [facility];  Service: Vascular;  Laterality: Right;  . CATARACT EXTRACTION W/ INTRAOCULAR LENS IMPLANT Bilateral   . CESAREAN SECTION    . CHOLECYSTECTOMY N/A 05/25/2013   Procedure: LAPAROSCOPIC CHOLECYSTECTOMY;  Surgeon: Jamesetta So, MD;  [facility];  Service: General;  Laterality: N/A;  . COLONOSCOPY WITH PROPOFOL N/A 10/02/2016   Procedure: COLONOSCOPY WITH PROPOFOL;  Surgeon: Danie Binder, MD;  [facility];  Service: Endoscopy;  Laterality: N/A;  145 - pt knows to arrive at 11:15 per office  . ESOPHAGOGASTRODUODENOSCOPY (EGD) WITH PROPOFOL N/A 10/02/2016   Procedure: ESOPHAGOGASTRODUODENOSCOPY (EGD) WITH PROPOFOL;  Surgeon: Danie Binder, MD;  [facility];  Service: Endoscopy;  Laterality: N/A;  . EYE SURGERY Bilateral   . IR THROMBECTOMY AV FISTULA W/THROMBOLYSIS/PTA/STENT INC/SHUNT/IMG RT Right 08/08/2018  . IR US GUIDE VASC ACCESS RIGHT  08/08/2018  . PARS PLANA VITRECTOMY Left 11/24/2014   Procedure: PARS PLANA VITRECTOMY WITH 25 GAUGE;  Surgeon: Hurman Horn, MD;  Location: [facility];  Service: Ophthalmology;  Laterality: Left;  . PERIPHERAL VASCULAR BALLOON ANGIOPLASTY Right 03/05/2018   Procedure: PERIPHERAL VASCULAR BALLOON ANGIOPLASTY;  Surgeon:  Waynetta Sandy, MD;  Location: [facility];  Service: Cardiovascular;  Laterality: Right;  arm fistula  . PERIPHERAL VASCULAR CATHETERIZATION N/A 04/28/2015   Procedure: Bilateral Upper Extremity Venography;  Surgeon: Conrad Mifflin, MD;  Location: [facility];  Service: Cardiovascular;  Laterality: N/A;  . PHOTOCOAGULATION WITH LASER Left 11/24/2014   Procedure: PHOTOCOAGULATION WITH LASER;  Surgeon: Hurman Horn, MD;  Location: [facility];  Service: Ophthalmology;  Laterality: Left;  with insertion of  silicone oil  . SAVORY DILATION N/A 10/02/2016   Procedure: SAVORY DILATION;  Surgeon: Danie Binder, MD;  [facility];  Service: Endoscopy;  Laterality: N/A;  . TUBAL LIGATION      Current Outpatient Medications  Medication Sig Dispense Refill  . aspirin 81 MG tablet Take 81 mg by mouth daily.    Marland Kitchen azelastine (ASTELIN) 0.1 % nasal spray Place 1 spray into both nostrils 2 (two) times daily. Use in each nostril as directed 30 mL 12  . brimonidine-timolol (COMBIGAN) 0.2-0.5 % ophthalmic solution Place 1 drop into both eyes daily.     . carvedilol (COREG) 12.5 MG tablet Take 1 tablet (12.5 mg total) by mouth 2 (two) times daily. 60 tablet 5  . cholecalciferol (VITAMIN D) 1000 units tablet Take 1,000 Units by mouth daily.     . Continuous Blood Gluc Sensor (FREESTYLE LIBRE 14 DAY SENSOR) MISC Inject 1 each into the skin every 14 (fourteen) days. Use as directed. 2 each 2  . gabapentin (NEURONTIN) 100 MG capsule Take 1 capsule (100 mg total) by mouth 3 (three) times daily. 90 capsule 3  . glucose blood (PRODIGY NO CODING BLOOD GLUC) test strip USE TO CHECK BLOOD SUGAR TWICE DAILY. 200 each 3  . Insulin Glargine (LANTUS SOLOSTAR) 100 UNIT/ML Solostar Pen Inject 18-50 Units into the skin at bedtime. 5 pen 11  . Insulin Pen Needle (PEN NEEDLES) 31G X 5 MM MISC 1 Device by Does not apply route daily. 100 each 3  . lidocaine-prilocaine (EMLA) cream Apply 1 application topically daily as needed (arm port).    Marland Kitchen linagliptin (TRADJENTA) 5 MG TABS tablet Take 1 tablet (5 mg total) by mouth daily. 90 tablet 3  . pantoprazole (PROTONIX) 40 MG tablet TAKE 1 BY MOUTH DAILY 30 minutes before breakfast 90 tablet 3  . PRODIGY TWIST TOP LANCETS 28G MISC USE TO CHECK BLOOD SUGAR UP TO FOUR TIMES DAILY. 100 each 1  . [START ON 09/14/2018] sertraline (ZOLOFT) 100 MG tablet Take 1 tablet (100 mg total) by mouth daily. 30 tablet 0  . sevelamer carbonate (RENVELA) 800 MG tablet Take 2,400 mg by mouth See admin  instructions. Take 2452m by mouth three times daily with means and 8043mwith snacks    . traMADol (ULTRAM) 50 MG tablet Take 1 tablet (50 mg total) by mouth daily as needed. 30 tablet 2  . traZODone (DESYREL) 50 MG tablet TAKE 1/2 TO 1 TABLET BY MOUTH AT BEDTIME AS NEEDED FOR SLEEP. (Patient taking differently: Take 50-100 mg by mouth at bedtime as needed for sleep. ) 30 tablet 2   No current facility-administered medications for this visit.    Allergies:  Bactrim [sulfamethoxazole-trimethoprim] and Prednisone   Social History: The patient  reports that she has been smoking cigarettes. She has a 34.50 pack-year smoking history. She has never used smokeless tobacco. She reports that she does not drink alcohol or use drugs.   ROS:  Please see the history of present  illness. Otherwise, complete review of systems is positive for visual impairment as before.  All other systems are reviewed and negative.   Physical Exam: VS:  BP 117/76   Pulse 76   Ht 5' 3"  (1.6 m)   Wt 168 lb 12.8 oz (76.6 kg)   LMP 05/05/2015   SpO2 100%   BMI 29.90 kg/m , BMI Body mass index is 29.9 kg/m.  Wt Readings from Last 3 Encounters:  09/12/18 168 lb 12.8 oz (76.6 kg)  08/29/18 173 lb 9.6 oz (78.7 kg)  08/08/18 165 lb (74.8 kg)    General: Chronically ill-appearing woman, appears comfortable at rest. HEENT: Conjunctiva and lids normal, oropharynx clear with poor dentition. Neck: Supple, no elevated JVP or carotid bruits, no thyromegaly. Lungs: Clear to auscultation, nonlabored breathing at rest. Cardiac: Regular rate and rhythm, no S3, soft systolic murmur. Abdomen: Soft, nontender, bowel sounds present. Extremities: No pitting edema, distal pulses 2+.  Right arm AV fistula.  ECG: I personally reviewed the tracing from 09/11/2017 which showed sinus rhythm with rightward axis.  Lead motion artifact.  Recent Labwork: 08/08/2018: Hemoglobin 11.3; Platelets 148 08/29/2018: ALT 12; AST 8; BUN 40; Creatinine,  Ser 5.99; Potassium 4.6; Sodium 136     Component Value Date/Time   CHOL 222 (H) 05/14/2018 1357   TRIG 227 (H) 05/14/2018 1357   HDL 65 05/14/2018 1357   CHOLHDL 3.4 05/14/2018 1357   LDLCALC 112 (H) 05/14/2018 1357    Other Studies Reviewed Today:  Echocardiogram 09/06/2016: Study Conclusions  - Left ventricle: The cavity size was normal. Wall thickness was at   the upper limits of normal. Systolic function was normal. The   estimated ejection fraction was in the range of 55% to 60%. Wall   motion was normal; there were no regional wall motion   abnormalities. Features are consistent with a pseudonormal left   ventricular filling pattern, with concomitant abnormal relaxation   and increased filling pressure (grade 2 diastolic dysfunction). - Aortic valve: Mildly calcified annulus. Trileaflet; mildly   calcified leaflets. There was trivial regurgitation. - Mitral valve: There was mild regurgitation. - Left atrium: The atrium was moderately dilated. - Right ventricle: The cavity size was mildly dilated. Systolic   function was low normal. - Right atrium: Central venous pressure (est): 15 mm Hg. - Atrial septum: No defect or patent foramen ovale was identified. - Tricuspid valve: There was moderate regurgitation. - Pulmonic valve: There was mild regurgitation. - Pulmonary arteries: Systolic pressure was moderately increased.   PA peak pressure: 54 mm Hg (S). - Pericardium, extracardiac: There was no pericardial effusion.  Impressions:  - Upper normal LV wall thickness with LVEF 55-60% and grade 2   diastolic dysfunction. Moderate left atrial enlargement. Mild   mitral regurgitation. Sclerotic aortic valve with trivial aortic   regurgitation. Mildly dilated right ventricle with low normal   contraction. Moderate tricuspid regurgitation with evidence of   moderate pulmonary hypertension, PASP estimated 54 mmHg.  Assessment and Plan:  1.  Essential hypertension.  Blood  pressure trend has been down in general and she has had some trouble with low blood pressure during hemodialysis by report.  She has already come off of hydralazine.  Would reduce Coreg to 6.25 mg twice daily, hold a.m. dose on dialysis days.  If this is not effective, could discontinue altogether.  2.  ESRD on hemodialysis.  She follows with Dr. Lowanda Foster.  If the above measures are not effective, she might need to be considered  for midodrine with hemodialysis sessions.  3.  Moderate diastolic dysfunction by prior work-up.  Fluid status is managed via hemodialysis.  Current medicines were reviewed with the patient today.   Orders Placed This Encounter  Procedures  . EKG 12-Lead    Disposition: Follow-up in 1 year.  Signed, Satira Sark, MD, [facility] 09/12/2018 2:15 PM    [facility], [place], [place] [address] Phone: [phone]; Fax: [phone]

## 2018-09-12 ENCOUNTER — Encounter: Payer: Self-pay | Admitting: Cardiology

## 2018-09-12 ENCOUNTER — Ambulatory Visit (INDEPENDENT_AMBULATORY_CARE_PROVIDER_SITE_OTHER): Payer: Medicare Other | Admitting: Cardiology

## 2018-09-12 VITALS — BP 117/76 | HR 76 | Ht 63.0 in | Wt 168.8 lb

## 2018-09-12 DIAGNOSIS — N186 End stage renal disease: Secondary | ICD-10-CM | POA: Diagnosis not present

## 2018-09-12 DIAGNOSIS — Z992 Dependence on renal dialysis: Secondary | ICD-10-CM

## 2018-09-12 DIAGNOSIS — I5189 Other ill-defined heart diseases: Secondary | ICD-10-CM | POA: Diagnosis not present

## 2018-09-12 DIAGNOSIS — I10 Essential (primary) hypertension: Secondary | ICD-10-CM | POA: Diagnosis not present

## 2018-09-12 MED ORDER — CARVEDILOL 6.25 MG PO TABS: 6.2500 mg | ORAL_TABLET | Freq: Two times a day (BID) | ORAL | 11 refills | Status: DC

## 2018-09-12 NOTE — Patient Instructions (Signed)
Medication Instructions:   Decrease Coreg to 6.67m twice a day.  Continue all other medications.    Labwork:   Testing/Procedures:   Follow-Up: Your physician wants you to follow up in:  1 year.  You will receive a reminder letter in the mail one-two months in advance.  If you don't receive a letter, please call our office to schedule the follow up appointment   Any Other Special Instructions Will Be Listed Below (If Applicable).  If you need a refill on your cardiac medications before your next appointment, please call your pharmacy.

## 2018-09-15 ENCOUNTER — Ambulatory Visit (INDEPENDENT_AMBULATORY_CARE_PROVIDER_SITE_OTHER): Payer: Medicare Other | Admitting: Psychiatry

## 2018-09-15 ENCOUNTER — Encounter (HOSPITAL_COMMUNITY): Payer: Self-pay | Admitting: Psychiatry

## 2018-09-15 VITALS — BP 142/82 | HR 70 | Ht 63.0 in | Wt 176.0 lb

## 2018-09-15 DIAGNOSIS — F331 Major depressive disorder, recurrent, moderate: Secondary | ICD-10-CM | POA: Diagnosis not present

## 2018-09-15 DIAGNOSIS — R945 Abnormal results of liver function studies: Secondary | ICD-10-CM | POA: Diagnosis not present

## 2018-09-15 MED ORDER — SERTRALINE HCL 100 MG PO TABS: 100.0000 mg | ORAL_TABLET | Freq: Every day | ORAL | 0 refills | Status: DC

## 2018-09-15 NOTE — Patient Instructions (Signed)
1. Continue sertraline 100 mg daily  2. Return to clinic in three months for 15 mins

## 2018-09-16 DIAGNOSIS — N186 End stage renal disease: Secondary | ICD-10-CM | POA: Diagnosis not present

## 2018-09-16 DIAGNOSIS — D631 Anemia in chronic kidney disease: Secondary | ICD-10-CM | POA: Diagnosis not present

## 2018-09-16 DIAGNOSIS — Z992 Dependence on renal dialysis: Secondary | ICD-10-CM | POA: Diagnosis not present

## 2018-09-16 DIAGNOSIS — N250 Renal osteodystrophy: Secondary | ICD-10-CM | POA: Diagnosis not present

## 2018-09-16 LAB — FERRITIN: Ferritin: 710 ng/mL — ABNORMAL HIGH (ref 16–232)

## 2018-09-18 DIAGNOSIS — D631 Anemia in chronic kidney disease: Secondary | ICD-10-CM | POA: Diagnosis not present

## 2018-09-18 DIAGNOSIS — Z992 Dependence on renal dialysis: Secondary | ICD-10-CM | POA: Diagnosis not present

## 2018-09-18 DIAGNOSIS — N186 End stage renal disease: Secondary | ICD-10-CM | POA: Diagnosis not present

## 2018-09-18 DIAGNOSIS — N250 Renal osteodystrophy: Secondary | ICD-10-CM | POA: Diagnosis not present

## 2018-09-20 ENCOUNTER — Other Ambulatory Visit: Payer: Self-pay | Admitting: Family Medicine

## 2018-09-22 DIAGNOSIS — D631 Anemia in chronic kidney disease: Secondary | ICD-10-CM | POA: Diagnosis not present

## 2018-09-22 DIAGNOSIS — N186 End stage renal disease: Secondary | ICD-10-CM | POA: Diagnosis not present

## 2018-09-22 DIAGNOSIS — Z992 Dependence on renal dialysis: Secondary | ICD-10-CM | POA: Diagnosis not present

## 2018-09-22 DIAGNOSIS — N250 Renal osteodystrophy: Secondary | ICD-10-CM | POA: Diagnosis not present

## 2018-09-23 DIAGNOSIS — I953 Hypotension of hemodialysis: Secondary | ICD-10-CM | POA: Diagnosis not present

## 2018-09-23 DIAGNOSIS — N186 End stage renal disease: Secondary | ICD-10-CM | POA: Diagnosis not present

## 2018-09-23 DIAGNOSIS — E1122 Type 2 diabetes mellitus with diabetic chronic kidney disease: Secondary | ICD-10-CM | POA: Diagnosis not present

## 2018-09-23 DIAGNOSIS — I120 Hypertensive chronic kidney disease with stage 5 chronic kidney disease or end stage renal disease: Secondary | ICD-10-CM | POA: Diagnosis not present

## 2018-09-23 DIAGNOSIS — E8770 Fluid overload, unspecified: Secondary | ICD-10-CM | POA: Diagnosis not present

## 2018-09-23 DIAGNOSIS — Z79899 Other long term (current) drug therapy: Secondary | ICD-10-CM | POA: Diagnosis not present

## 2018-09-23 DIAGNOSIS — W1839XA Other fall on same level, initial encounter: Secondary | ICD-10-CM | POA: Diagnosis not present

## 2018-09-23 DIAGNOSIS — R55 Syncope and collapse: Secondary | ICD-10-CM | POA: Diagnosis not present

## 2018-09-23 DIAGNOSIS — Z992 Dependence on renal dialysis: Secondary | ICD-10-CM | POA: Diagnosis not present

## 2018-09-23 DIAGNOSIS — Z720 Tobacco use: Secondary | ICD-10-CM | POA: Diagnosis not present

## 2018-09-23 DIAGNOSIS — I959 Hypotension, unspecified: Secondary | ICD-10-CM | POA: Diagnosis not present

## 2018-09-23 DIAGNOSIS — G319 Degenerative disease of nervous system, unspecified: Secondary | ICD-10-CM | POA: Diagnosis not present

## 2018-09-23 DIAGNOSIS — E8779 Other fluid overload: Secondary | ICD-10-CM | POA: Diagnosis not present

## 2018-09-23 DIAGNOSIS — R4020 Unspecified coma: Secondary | ICD-10-CM | POA: Diagnosis not present

## 2018-09-25 DIAGNOSIS — Z992 Dependence on renal dialysis: Secondary | ICD-10-CM | POA: Diagnosis not present

## 2018-09-25 DIAGNOSIS — D631 Anemia in chronic kidney disease: Secondary | ICD-10-CM | POA: Diagnosis not present

## 2018-09-25 DIAGNOSIS — N186 End stage renal disease: Secondary | ICD-10-CM | POA: Diagnosis not present

## 2018-09-25 DIAGNOSIS — N250 Renal osteodystrophy: Secondary | ICD-10-CM | POA: Diagnosis not present

## 2018-09-26 ENCOUNTER — Other Ambulatory Visit (HOSPITAL_COMMUNITY): Payer: Self-pay

## 2018-09-27 DIAGNOSIS — D631 Anemia in chronic kidney disease: Secondary | ICD-10-CM | POA: Diagnosis not present

## 2018-09-27 DIAGNOSIS — N250 Renal osteodystrophy: Secondary | ICD-10-CM | POA: Diagnosis not present

## 2018-09-27 DIAGNOSIS — N186 End stage renal disease: Secondary | ICD-10-CM | POA: Diagnosis not present

## 2018-09-27 DIAGNOSIS — Z992 Dependence on renal dialysis: Secondary | ICD-10-CM | POA: Diagnosis not present

## 2018-09-29 ENCOUNTER — Ambulatory Visit: Payer: Self-pay | Admitting: Nurse Practitioner

## 2018-09-30 DIAGNOSIS — D631 Anemia in chronic kidney disease: Secondary | ICD-10-CM | POA: Diagnosis not present

## 2018-09-30 DIAGNOSIS — N250 Renal osteodystrophy: Secondary | ICD-10-CM | POA: Diagnosis not present

## 2018-09-30 DIAGNOSIS — D509 Iron deficiency anemia, unspecified: Secondary | ICD-10-CM | POA: Diagnosis not present

## 2018-09-30 DIAGNOSIS — Z992 Dependence on renal dialysis: Secondary | ICD-10-CM | POA: Diagnosis not present

## 2018-09-30 DIAGNOSIS — N186 End stage renal disease: Secondary | ICD-10-CM | POA: Diagnosis not present

## 2018-10-01 ENCOUNTER — Inpatient Hospital Stay (HOSPITAL_COMMUNITY): Payer: Medicare Other | Attending: Internal Medicine

## 2018-10-01 ENCOUNTER — Other Ambulatory Visit: Payer: Self-pay

## 2018-10-01 DIAGNOSIS — N186 End stage renal disease: Secondary | ICD-10-CM | POA: Insufficient documentation

## 2018-10-01 DIAGNOSIS — M79671 Pain in right foot: Secondary | ICD-10-CM | POA: Diagnosis not present

## 2018-10-01 DIAGNOSIS — Z8614 Personal history of Methicillin resistant Staphylococcus aureus infection: Secondary | ICD-10-CM | POA: Diagnosis not present

## 2018-10-01 DIAGNOSIS — E1122 Type 2 diabetes mellitus with diabetic chronic kidney disease: Secondary | ICD-10-CM | POA: Diagnosis not present

## 2018-10-01 DIAGNOSIS — Z992 Dependence on renal dialysis: Secondary | ICD-10-CM | POA: Diagnosis not present

## 2018-10-01 DIAGNOSIS — S99921A Unspecified injury of right foot, initial encounter: Secondary | ICD-10-CM | POA: Diagnosis not present

## 2018-10-01 DIAGNOSIS — F17200 Nicotine dependence, unspecified, uncomplicated: Secondary | ICD-10-CM | POA: Diagnosis not present

## 2018-10-01 DIAGNOSIS — R7989 Other specified abnormal findings of blood chemistry: Secondary | ICD-10-CM | POA: Diagnosis not present

## 2018-10-01 DIAGNOSIS — I120 Hypertensive chronic kidney disease with stage 5 chronic kidney disease or end stage renal disease: Secondary | ICD-10-CM | POA: Diagnosis not present

## 2018-10-01 DIAGNOSIS — S93601A Unspecified sprain of right foot, initial encounter: Secondary | ICD-10-CM | POA: Diagnosis not present

## 2018-10-01 DIAGNOSIS — Z79899 Other long term (current) drug therapy: Secondary | ICD-10-CM | POA: Diagnosis not present

## 2018-10-01 DIAGNOSIS — J340 Abscess, furuncle and carbuncle of nose: Secondary | ICD-10-CM | POA: Diagnosis not present

## 2018-10-01 LAB — CBC WITH DIFFERENTIAL/PLATELET
Abs Immature Granulocytes: 0.03 10*3/uL (ref 0.00–0.07)
Basophils Absolute: 0.1 10*3/uL (ref 0.0–0.1)
Basophils Relative: 1 %
Eosinophils Absolute: 0.2 10*3/uL (ref 0.0–0.5)
Eosinophils Relative: 2 %
HCT: 34.5 % — ABNORMAL LOW (ref 36.0–46.0)
Hemoglobin: 10.9 g/dL — ABNORMAL LOW (ref 12.0–15.0)
Immature Granulocytes: 0 %
Lymphocytes Relative: 17 %
Lymphs Abs: 1.4 10*3/uL (ref 0.7–4.0)
MCH: 32.3 pg (ref 26.0–34.0)
MCHC: 31.6 g/dL (ref 30.0–36.0)
MCV: 102.4 fL — ABNORMAL HIGH (ref 80.0–100.0)
Monocytes Absolute: 0.7 10*3/uL (ref 0.1–1.0)
Monocytes Relative: 9 %
Neutro Abs: 6.2 10*3/uL (ref 1.7–7.7)
Neutrophils Relative %: 71 %
Platelets: 206 10*3/uL (ref 150–400)
RBC: 3.37 MIL/uL — ABNORMAL LOW (ref 3.87–5.11)
RDW: 13.2 % (ref 11.5–15.5)
WBC: 8.6 10*3/uL (ref 4.0–10.5)
nRBC: 0 % (ref 0.0–0.2)

## 2018-10-01 LAB — COMPREHENSIVE METABOLIC PANEL
ALT: 15 U/L (ref 0–44)
AST: 17 U/L (ref 15–41)
Albumin: 4.1 g/dL (ref 3.5–5.0)
Alkaline Phosphatase: 276 U/L — ABNORMAL HIGH (ref 38–126)
Anion gap: 14 (ref 5–15)
BUN: 27 mg/dL — ABNORMAL HIGH (ref 6–20)
CALCIUM: 8.6 mg/dL — AB (ref 8.9–10.3)
CO2: 25 mmol/L (ref 22–32)
Chloride: 98 mmol/L (ref 98–111)
Creatinine, Ser: 5.37 mg/dL — ABNORMAL HIGH (ref 0.44–1.00)
GFR calc non Af Amer: 9 mL/min — ABNORMAL LOW (ref >60)
GFR, EST AFRICAN AMERICAN: 10 mL/min — AB (ref >60)
Glucose, Bld: 139 mg/dL — ABNORMAL HIGH (ref 70–99)
Potassium: 4.7 mmol/L (ref 3.5–5.1)
Sodium: 137 mmol/L (ref 135–145)
Total Bilirubin: 0.3 mg/dL (ref 0.3–1.2)
Total Protein: 8.1 g/dL (ref 6.5–8.1)

## 2018-10-01 LAB — FERRITIN: Ferritin: 633 ng/mL — ABNORMAL HIGH (ref 11–307)

## 2018-10-01 LAB — LACTATE DEHYDROGENASE: LDH: 159 U/L (ref 98–192)

## 2018-10-02 ENCOUNTER — Telehealth: Payer: Self-pay | Admitting: Family Medicine

## 2018-10-02 DIAGNOSIS — N250 Renal osteodystrophy: Secondary | ICD-10-CM | POA: Diagnosis not present

## 2018-10-02 DIAGNOSIS — D631 Anemia in chronic kidney disease: Secondary | ICD-10-CM | POA: Diagnosis not present

## 2018-10-02 DIAGNOSIS — Z992 Dependence on renal dialysis: Secondary | ICD-10-CM | POA: Diagnosis not present

## 2018-10-02 DIAGNOSIS — N186 End stage renal disease: Secondary | ICD-10-CM | POA: Diagnosis not present

## 2018-10-02 DIAGNOSIS — D509 Iron deficiency anemia, unspecified: Secondary | ICD-10-CM | POA: Diagnosis not present

## 2018-10-02 NOTE — Telephone Encounter (Signed)
We can do a prescription for a wheelchair for her, I am fine with that but I do think that sometimes it does require a face-to-face for those things.  I guess this is something that may be Juliann Pulse might be able to answer better for [place].  If she does not need to come in that I am fine with sending prescription for it

## 2018-10-02 NOTE — Telephone Encounter (Signed)
Spoke with pt's caregiver regarding foot injury Pt injured foot yesterday at dialysis Pt was seen in ER for evaluation No fracture per pt's caregiver Foot is very sore and pt unable to walk per caregiver Pt requesting RX for wheelchair Please advise

## 2018-10-03 ENCOUNTER — Ambulatory Visit (HOSPITAL_COMMUNITY): Payer: Self-pay | Admitting: Hematology

## 2018-10-04 DIAGNOSIS — D509 Iron deficiency anemia, unspecified: Secondary | ICD-10-CM | POA: Diagnosis not present

## 2018-10-04 DIAGNOSIS — N186 End stage renal disease: Secondary | ICD-10-CM | POA: Diagnosis not present

## 2018-10-04 DIAGNOSIS — N250 Renal osteodystrophy: Secondary | ICD-10-CM | POA: Diagnosis not present

## 2018-10-04 DIAGNOSIS — D631 Anemia in chronic kidney disease: Secondary | ICD-10-CM | POA: Diagnosis not present

## 2018-10-04 DIAGNOSIS — Z992 Dependence on renal dialysis: Secondary | ICD-10-CM | POA: Diagnosis not present

## 2018-10-06 ENCOUNTER — Ambulatory Visit (INDEPENDENT_AMBULATORY_CARE_PROVIDER_SITE_OTHER): Payer: Medicare Other | Admitting: Nurse Practitioner

## 2018-10-06 ENCOUNTER — Encounter: Payer: Self-pay | Admitting: Nurse Practitioner

## 2018-10-06 ENCOUNTER — Telehealth: Payer: Self-pay | Admitting: *Deleted

## 2018-10-06 VITALS — BP 155/75 | HR 76 | Temp 96.5°F | Ht 63.0 in | Wt 174.0 lb

## 2018-10-06 DIAGNOSIS — K5900 Constipation, unspecified: Secondary | ICD-10-CM | POA: Diagnosis not present

## 2018-10-06 DIAGNOSIS — R1310 Dysphagia, unspecified: Secondary | ICD-10-CM | POA: Diagnosis not present

## 2018-10-06 DIAGNOSIS — R1319 Other dysphagia: Secondary | ICD-10-CM

## 2018-10-06 DIAGNOSIS — K760 Fatty (change of) liver, not elsewhere classified: Secondary | ICD-10-CM

## 2018-10-06 NOTE — Progress Notes (Signed)
Referring Provider: Dettinger, Fransisca Kaufmann, MD Primary Care Physician:  Dettinger, Fransisca Kaufmann, MD Primary GI:  Dr. [place] Alar  Chief Complaint  Patient presents with  . Dysphagia    sometimes has to cough food back up  . Constipation    occ    HPI:   Kara Knapp is a 48 y.o. female who presents for follow-up on constipation and diarrhea.  The patient was last seen in our office 06/30/2018 for constipation, diarrhea, abdominal pain, abnormal LFTs, iron overload.  Previous question of cirrhosis but liver biopsy deferred due to acute health issues that were ongoing at that time.  CT of the abdomen and pelvis in February 2019 upper limit normal liver size and minimal surface nodularity.  Platelets historically normal.  On chronic dialysis.  Autoimmune serologies normal, ferritin significantly elevated at 1201.    Ultrasound-guided liver biopsy was finally completed July 2019 which found marketed secondary iron overload, moderate fibrosis with fibrous septa that show rare bridging (stage II 04).  Differentials at that time included drug-induced liver injury or fibrosis due to iron overload and the patient was referred to hematology.  Hematology felt iron overload secondary in nature and likely due to frequent iron transfusions received during dialysis.  Negative hemochromatosis evaluation.  Recommended ongoing care.  Subsequent ferritin was improved in August 2019 at 901.  Most recent CMP in October 2019 with normal transaminases and bilirubin, alkaline phosphatase improved to 291.  Last EGD and colonoscopy completed 10/02/2016.  EGD found a mucosal nodule in the esophagus status post biopsy, moderate gastritis status post biopsy, mild duodenitis status post biopsy.  Surgical pathology found the esophageal biopsy to be benign, gastric biopsies to be gastritis due to aspirin use, and duodenal biopsies to be duodenitis due to aspirin use.  Recommended continue present medications, take Protonix  daily.  Colonoscopy the same day found no source for diarrhea, two 3 to 5 mm polyps in the descending colon, three 2 to 3 mm polyps in the rectum, random colon biopsies, rectal bleeding due to internal hemorrhoids, friability with no bleeding at the anus status post biopsy.  Surgical pathology found the polyps to be a mix of hyperplastic and tubular adenoma, random colon biopsies to be benign colonic mucosa, and the anal biopsies to be anal skin tag.   At her last visit she noted "nervous stomach" with fecal urgency with loose stools otherwise constipated and has to "did get out."  She is accompanied by a companion who stated she has a lot of the same symptoms the companions grandmother had when "she was diagnosed with rotten guts."  She was previously improved on Linzess better last visit she stated bowel movement every day but stools are hard, tries not to strain and instead will "did get out."  Not on anything for constipation.  She noted that she was previously "given something for constipation but it sent me to the bathroom too quick" although she did not inform us she does stop taking her medication.  Abdominal cramping improves with a bowel movement.  Hematochezia for the previous week and notes "it is coming from both ends" and motioned toward her vagina and rectum.  Appointment already scheduled for gynecology.  Recent multiple tooth extraction awaiting fitting for dentures.  Recommended Amitiza 8 mcg twice a day, progress report in 1 week, labs, follow-up in 3 months.  She did call our office 07/09/2018 noting Amitiza was helping and requested a prescription which was sent to her pharmacy.  Unfortunately her prescription was denied and initially we were told "there is nothing on the formulary for constipation."  When we investigated further they noted alternatives would be Linzess, Movantik, or Relistor.  Noted previous adverse effects of Linzess.  Movantik and Relistor for OIC and not FDA approved  for IBS-C.  When we notified the patient of pending prior approval she stated "well Amitiza caused diarrhea also so I stopped taking it."  She subsequently had 2 bowel movements "yesterday, but sometimes had to do get out."  Recommended she restart probiotic and Colace daily with MiraLAX 1-2 times a day as needed.  Today she states she's doing ok overall. She is accompanied by her daughter in law/aide. Constipation doing better, having daily bowel movement. Rare straining, but when she does she gets cramps. Taking Probiotics. On chronic dialysis. States she can't do MiraLAX. Having solid food dysphagia, wasn't able to make it to [facility] for swallowing evaluation/[facility]. Solid food dysphagia about once a day, not every meal. Triggers significant coughing. Denies hematochezia, melena, fever, chills, unintentional weight loss. Denies chest pain, dyspnea, dizziness, lightheadedness, syncope, near syncope. Denies any other upper or lower GI symptoms.  Past Medical History:  Diagnosis Date  . Anemia of chronic disease   . Asthma   . Blind left eye   . Bronchitis   . Cataract   . Cholecystitis, acute 05/26/2013   Status post cholecystectomy  . Chronic abdominal pain   . Chronic diarrhea   . Depression   . Diabetic foot ulcer ([place]) 03/01/2015  . Diastolic heart failure ([place])   . ESRD on hemodialysis ([place])    Started diaylsis 12/29/15  . Essential hypertension   . Fibroids   . Glaucoma   . History of blood transfusion   . History of pneumonia   . Hyperlipidemia   . Insulin-dependent diabetes mellitus with retinopathy ([place])   . Neuropathy   . Osteomyelitis ([place])    Toe on left foot    Past Surgical History:  Procedure Laterality Date  . A/V SHUNTOGRAM N/A 10/25/2016   Procedure: A/V Shuntogram - Right Arm;  Surgeon: Waynetta Sandy, MD;  Location: [place] [facility];  Service: Cardiovascular;  Laterality: N/A;  . A/V SHUNTOGRAM N/A 03/05/2018   Procedure: A/V SHUNTOGRAM - Right Arm;   Surgeon: Waynetta Sandy, MD;  Location: [place] [facility];  Service: Cardiovascular;  Laterality: N/A;  . AV FISTULA PLACEMENT Right 10/17/2015   Procedure: INSERTION OF ARTERIOVENOUS GORE-TEX GRAFT RIGHT UPPER ARM WITH ACUSEAL;  Surgeon: Conrad Sheridan Lake, MD;  Location: [place];  Service: Vascular;  Laterality: Right;  . CATARACT EXTRACTION W/ INTRAOCULAR LENS IMPLANT Bilateral   . CESAREAN SECTION    . CHOLECYSTECTOMY N/A 05/25/2013   Procedure: LAPAROSCOPIC CHOLECYSTECTOMY;  Surgeon: Jamesetta So, MD;  Location: [facility];  Service: General;  Laterality: N/A;  . COLONOSCOPY WITH PROPOFOL N/A 10/02/2016   Procedure: COLONOSCOPY WITH PROPOFOL;  Surgeon: Danie Binder, MD;  [facility];  Service: Endoscopy;  Laterality: N/A;  145 - pt knows to arrive at 11:15 per office  . ESOPHAGOGASTRODUODENOSCOPY (EGD) WITH PROPOFOL N/A 10/02/2016   Procedure: ESOPHAGOGASTRODUODENOSCOPY (EGD) WITH PROPOFOL;  Surgeon: Danie Binder, MD;  [facility];  Service: Endoscopy;  Laterality: N/A;  . EYE SURGERY Bilateral   . IR THROMBECTOMY AV FISTULA W/THROMBOLYSIS/PTA/STENT INC/SHUNT/IMG RT Right 08/08/2018  . IR US GUIDE VASC ACCESS RIGHT  08/08/2018  . PARS PLANA VITRECTOMY Left 11/24/2014   Procedure: PARS  PLANA VITRECTOMY WITH 25 GAUGE;  Surgeon: Hurman Horn, MD;  Location: [place];  Service: Ophthalmology;  Laterality: Left;  . PERIPHERAL VASCULAR BALLOON ANGIOPLASTY Right 03/05/2018   Procedure: PERIPHERAL VASCULAR BALLOON ANGIOPLASTY;  Surgeon: Waynetta Sandy, MD;  Location: [place] [facility];  Service: Cardiovascular;  Laterality: Right;  arm fistula  . PERIPHERAL VASCULAR CATHETERIZATION N/A 04/28/2015   Procedure: Bilateral Upper Extremity Venography;  Surgeon: Conrad Hyde, MD;  Location: [place] [facility];  Service: Cardiovascular;  Laterality: N/A;  . PHOTOCOAGULATION WITH LASER Left 11/24/2014   Procedure: PHOTOCOAGULATION WITH LASER;  Surgeon: Hurman Horn, MD;   Location: [place];  Service: Ophthalmology;  Laterality: Left;  with insertion of silicone oil  . SAVORY DILATION N/A 10/02/2016   Procedure: SAVORY DILATION;  Surgeon: Danie Binder, MD;  [facility];  Service: Endoscopy;  Laterality: N/A;  . TUBAL LIGATION      Current Outpatient Medications  Medication Sig Dispense Refill  . aspirin 81 MG tablet Take 81 mg by mouth daily.    Marland Kitchen azelastine (ASTELIN) 0.1 % nasal spray Place 1 spray into both nostrils 2 (two) times daily. Use in each nostril as directed 30 mL 12  . brimonidine-timolol (COMBIGAN) 0.2-0.5 % ophthalmic solution Place 1 drop into both eyes daily.     . cholecalciferol (VITAMIN D) 1000 units tablet Take 1,000 Units by mouth daily.     . Continuous Blood Gluc Sensor (FREESTYLE LIBRE 14 DAY SENSOR) MISC Inject 1 each into the skin every 14 (fourteen) days. Use as directed. 2 each 2  . gabapentin (NEURONTIN) 100 MG capsule Take 1 capsule (100 mg total) by mouth 3 (three) times daily. 90 capsule 3  . glucose blood (PRODIGY NO CODING BLOOD GLUC) test strip USE TO CHECK BLOOD SUGAR TWICE DAILY. 200 each 3  . Insulin Glargine (LANTUS SOLOSTAR) 100 UNIT/ML Solostar Pen Inject 18-50 Units into the skin at bedtime. (Patient taking differently: Inject 18 Units into the skin at bedtime. ) 5 pen 11  . Insulin Pen Needle (PEN NEEDLES) 31G X 5 MM MISC 1 Device by Does not apply route daily. 100 each 3  . lidocaine-prilocaine (EMLA) cream Apply 1 application topically daily as needed (arm port).    Marland Kitchen linagliptin (TRADJENTA) 5 MG TABS tablet Take 1 tablet (5 mg total) by mouth daily. 90 tablet 3  . pantoprazole (PROTONIX) 40 MG tablet TAKE 1 BY MOUTH DAILY 30 minutes before breakfast 90 tablet 3  . PRODIGY TWIST TOP LANCETS 28G MISC USE TO CHECK BLOOD SUGAR UP TO FOUR TIMES DAILY. 100 each 1  . sertraline (ZOLOFT) 100 MG tablet Take 1 tablet (100 mg total) by mouth daily. 90 tablet 0  . sevelamer carbonate (RENVELA) 800 MG tablet Take 2,400  mg by mouth See admin instructions. Take 2443m by mouth three times daily with means and 8085mwith snacks    . traMADol (ULTRAM) 50 MG tablet Take 1 tablet (50 mg total) by mouth daily as needed. 30 tablet 2  . traZODone (DESYREL) 50 MG tablet TAKE 1/2 TO 1 TABLET BY MOUTH AT BEDTIME AS NEEDED FOR SLEEP. 30 tablet 2   No current facility-administered medications for this visit.     Allergies as of 10/06/2018 - Review Complete 10/06/2018  Allergen Reaction Noted  . Bactrim [sulfamethoxazole-trimethoprim] Nausea And Vomiting 05/21/2013  . Prednisone Other (See Comments) 07/09/2013    Family History  Problem Relation Age of Onset  . COPD Mother   .  Cancer Father   . Lymphoma Father   . Diabetes Sister   . Deep vein thrombosis Sister   . Diabetes Brother   . Hyperlipidemia Brother   . Hypertension Brother   . Mental retardation Sister   . Alcohol abuse Paternal Grandmother   . Colon cancer Neg Hx   . Liver disease Neg Hx     Social History   Socioeconomic History  . Marital status: Single    Spouse name: Not on file  . Number of children: 5  . Years of education: GED  . Highest education level: Not on file  Occupational History  . Not on file  Social Needs  . Financial resource strain: Not on file  . Food insecurity:    Worry: Not on file    Inability: Not on file  . Transportation needs:    Medical: Not on file    Non-medical: Not on file  Tobacco Use  . Smoking status: Current Every Day Smoker    Packs/day: 1.50    Years: 23.00    Pack years: 34.50    Types: Cigarettes  . Smokeless tobacco: Never Used  . Tobacco comment: one pack daily  Substance and Sexual Activity  . Alcohol use: No    Alcohol/week: 0.0 standard drinks    Frequency: Never  . Drug use: No    Comment: Sober for 8 years  . Sexual activity: Not Currently    Birth control/protection: Surgical  Lifestyle  . Physical activity:    Days per week: Not on file    Minutes per session: Not on  file  . Stress: Not on file  Relationships  . Social connections:    Talks on phone: Not on file    Gets together: Not on file    Attends religious service: Not on file    Active member of club or organization: Not on file    Attends meetings of clubs or organizations: Not on file    Relationship status: Not on file  Other Topics Concern  . Not on file  Social History Narrative  . Not on file    Review of Systems: Complete ROS negative except as per HPI.   Physical Exam: BP (!) 155/75   Pulse 76   Temp (!) 96.5 F (35.8 C) (Oral)   Ht 5' 3"  (1.6 m)   Wt 174 lb (78.9 kg)   LMP 05/05/2015   BMI 30.82 kg/m  General:   Alert and oriented. Pleasant and cooperative. Well-nourished and well-developed.  Head:  Normocephalic and atraumatic. Eyes:  Without icterus, sclera clear and conjunctiva pink.  Ears:  Normal auditory acuity. Mouth:  No deformity or lesions, oral mucosa pink.  Throat/Neck:  Supple, without mass or thyromegaly. Cardiovascular:  S1, S2 present without murmurs appreciated. Normal pulses noted. Extremities without clubbing or edema. Respiratory:  Clear to auscultation bilaterally. No wheezes, rales, or rhonchi. No distress.  Gastrointestinal:  +BS, soft, non-tender and non-distended. No HSM noted. No guarding or rebound. No masses appreciated.  Rectal:  Deferred  Musculoskalatal:  Symmetrical without gross deformities. Normal posture. Skin:  Intact without significant lesions or rashes. Neurologic:  Alert and oriented x4;  grossly normal neurologically. Psych:  Alert and cooperative. Normal mood and affect. Heme/Lymph/Immune: No significant cervical adenopathy. No excessive bruising noted.    10/06/2018 11:52 AM   Disclaimer: This note was dictated with voice recognition software. Similar sounding words can inadvertently be transcribed and may not be corrected upon review.

## 2018-10-06 NOTE — Telephone Encounter (Signed)
Called [facility] and was advised patient already established with Dr. Truddie Crumble. She is scheduled for 3/27 at 10:00am. Patient is aware of appt details and that [facility] will mail appt information

## 2018-10-06 NOTE — Assessment & Plan Note (Signed)
Recurrent esophageal dysphasia.  The patient has a history of dysphasia with EGDs completed previously.  In 2018 she was referred to [facility] for persistent dysphasia.  She stated this was too far and declined to go to her office visit there.  I have again recommended that she follow-up with [facility].  She states she will have to "bite the bullet and just go."  Continue current medications including Protonix.  Follow-up in 6 months.

## 2018-10-06 NOTE — Telephone Encounter (Signed)
Patient has a follow up appointment scheduled for a face to face for a wheelchair.

## 2018-10-06 NOTE — Assessment & Plan Note (Signed)
Initially felt to have cirrhosis.  However, liver biopsy found stage II of 4 fibrosis likely fatty liver disease given her history.  Noted iron overload for which she was referred to hematology and felt that this was secondary to iron infusions at hemodialysis.  The patient was negative for hemochromatosis gene analysis.  Recommend she continue to work on control of her blood pressure, blood sugar, cholesterol.  Do not take iron.  Monitor labs ongoing (she has a follow-up with hematology in a couple days and to have lab scheduled for that time).  Follow-up in 6 months.

## 2018-10-06 NOTE — Patient Instructions (Signed)
Your health issues we discussed today were:   Constipation: 1. Continue your current medications 2. If you are not having dialysis that day and you have worsening constipation on the day, you can use Colace stool softener 3. Call us for any severe or worsening problems  Dysphagia (swallowing problems): 1. As previously recommended, we will refer you back to [facility] to evaluate your swallowing problems 2. We highly recommend you follow-up with them for further evaluation  Liver scarring: 1. As discussed, you do not have cirrhosis.  You do have some mild scarring likely because of fatty liver disease 2. To help with this try to keep your blood pressure, blood sugar, cholesterol under control 3. Have labs drawn with hematology.  We will monitor your labs moving forward.  Overall I recommend:  1. Follow-up in 6 months 2. Call us if you have any questions or concerns.  At [facility] we value your feedback. You may receive a survey about your visit today. Please share your experience as we strive to create trusting relationships with our patients to provide genuine, compassionate, quality care.  We appreciate your understanding and patience as we review any laboratory studies, imaging, and other diagnostic tests that are ordered as we care for you. Our office policy is 5 business days for review of these results, and any emergent or urgent results are addressed in a timely manner for your best interest. If you do not hear from our office in 1 week, please contact us.   We also encourage the use of MyChart, which contains your medical information for your review as well. If you are not enrolled in this feature, an access code is on this after visit summary for your convenience. Thank you for allowing [place] to be involved in your care.  It was great to see you today!  I hope you have a great day!!

## 2018-10-06 NOTE — Telephone Encounter (Signed)
Patient was called at [phone] and she confirmed she would schedule ride with [facility] for upcoming appointment.

## 2018-10-06 NOTE — Assessment & Plan Note (Signed)
Constipation improved.  She does occasionally have breakthrough constipation.  I recommended she use Colace stool softener at that time and she states sometimes she can and sometimes she cannot.  Specifically she cannot if she is going to dialysis that day because of fear of diarrhea.  Continue other medications at this time.  Follow-up in 6 months.

## 2018-10-07 DIAGNOSIS — Z992 Dependence on renal dialysis: Secondary | ICD-10-CM | POA: Diagnosis not present

## 2018-10-07 DIAGNOSIS — D509 Iron deficiency anemia, unspecified: Secondary | ICD-10-CM | POA: Diagnosis not present

## 2018-10-07 DIAGNOSIS — D631 Anemia in chronic kidney disease: Secondary | ICD-10-CM | POA: Diagnosis not present

## 2018-10-07 DIAGNOSIS — N186 End stage renal disease: Secondary | ICD-10-CM | POA: Diagnosis not present

## 2018-10-07 DIAGNOSIS — N250 Renal osteodystrophy: Secondary | ICD-10-CM | POA: Diagnosis not present

## 2018-10-08 ENCOUNTER — Ambulatory Visit (HOSPITAL_COMMUNITY): Payer: Self-pay | Admitting: Internal Medicine

## 2018-10-10 DIAGNOSIS — Z992 Dependence on renal dialysis: Secondary | ICD-10-CM | POA: Diagnosis not present

## 2018-10-10 DIAGNOSIS — D509 Iron deficiency anemia, unspecified: Secondary | ICD-10-CM | POA: Diagnosis not present

## 2018-10-10 DIAGNOSIS — D631 Anemia in chronic kidney disease: Secondary | ICD-10-CM | POA: Diagnosis not present

## 2018-10-10 DIAGNOSIS — N186 End stage renal disease: Secondary | ICD-10-CM | POA: Diagnosis not present

## 2018-10-10 DIAGNOSIS — N250 Renal osteodystrophy: Secondary | ICD-10-CM | POA: Diagnosis not present

## 2018-10-13 DIAGNOSIS — N186 End stage renal disease: Secondary | ICD-10-CM | POA: Diagnosis not present

## 2018-10-13 DIAGNOSIS — D509 Iron deficiency anemia, unspecified: Secondary | ICD-10-CM | POA: Diagnosis not present

## 2018-10-13 DIAGNOSIS — D631 Anemia in chronic kidney disease: Secondary | ICD-10-CM | POA: Diagnosis not present

## 2018-10-13 DIAGNOSIS — N250 Renal osteodystrophy: Secondary | ICD-10-CM | POA: Diagnosis not present

## 2018-10-13 DIAGNOSIS — Z992 Dependence on renal dialysis: Secondary | ICD-10-CM | POA: Diagnosis not present

## 2018-10-14 DIAGNOSIS — N250 Renal osteodystrophy: Secondary | ICD-10-CM | POA: Diagnosis not present

## 2018-10-14 DIAGNOSIS — Z992 Dependence on renal dialysis: Secondary | ICD-10-CM | POA: Diagnosis not present

## 2018-10-14 DIAGNOSIS — N186 End stage renal disease: Secondary | ICD-10-CM | POA: Diagnosis not present

## 2018-10-14 DIAGNOSIS — D509 Iron deficiency anemia, unspecified: Secondary | ICD-10-CM | POA: Diagnosis not present

## 2018-10-14 DIAGNOSIS — D631 Anemia in chronic kidney disease: Secondary | ICD-10-CM | POA: Diagnosis not present

## 2018-10-15 ENCOUNTER — Ambulatory Visit: Payer: Medicare Other | Admitting: Podiatry

## 2018-10-16 DIAGNOSIS — N250 Renal osteodystrophy: Secondary | ICD-10-CM | POA: Diagnosis not present

## 2018-10-16 DIAGNOSIS — Z992 Dependence on renal dialysis: Secondary | ICD-10-CM | POA: Diagnosis not present

## 2018-10-16 DIAGNOSIS — D509 Iron deficiency anemia, unspecified: Secondary | ICD-10-CM | POA: Diagnosis not present

## 2018-10-16 DIAGNOSIS — D631 Anemia in chronic kidney disease: Secondary | ICD-10-CM | POA: Diagnosis not present

## 2018-10-16 DIAGNOSIS — N186 End stage renal disease: Secondary | ICD-10-CM | POA: Diagnosis not present

## 2018-10-18 DIAGNOSIS — D631 Anemia in chronic kidney disease: Secondary | ICD-10-CM | POA: Diagnosis not present

## 2018-10-18 DIAGNOSIS — N250 Renal osteodystrophy: Secondary | ICD-10-CM | POA: Diagnosis not present

## 2018-10-18 DIAGNOSIS — Z992 Dependence on renal dialysis: Secondary | ICD-10-CM | POA: Diagnosis not present

## 2018-10-18 DIAGNOSIS — N186 End stage renal disease: Secondary | ICD-10-CM | POA: Diagnosis not present

## 2018-10-18 DIAGNOSIS — D509 Iron deficiency anemia, unspecified: Secondary | ICD-10-CM | POA: Diagnosis not present

## 2018-10-21 DIAGNOSIS — E119 Type 2 diabetes mellitus without complications: Secondary | ICD-10-CM | POA: Diagnosis not present

## 2018-10-21 DIAGNOSIS — Z794 Long term (current) use of insulin: Secondary | ICD-10-CM | POA: Diagnosis not present

## 2018-10-21 DIAGNOSIS — D631 Anemia in chronic kidney disease: Secondary | ICD-10-CM | POA: Diagnosis not present

## 2018-10-21 DIAGNOSIS — Z992 Dependence on renal dialysis: Secondary | ICD-10-CM | POA: Diagnosis not present

## 2018-10-21 DIAGNOSIS — N186 End stage renal disease: Secondary | ICD-10-CM | POA: Diagnosis not present

## 2018-10-21 DIAGNOSIS — N250 Renal osteodystrophy: Secondary | ICD-10-CM | POA: Diagnosis not present

## 2018-10-21 DIAGNOSIS — D509 Iron deficiency anemia, unspecified: Secondary | ICD-10-CM | POA: Diagnosis not present

## 2018-10-22 ENCOUNTER — Ambulatory Visit (HOSPITAL_COMMUNITY): Payer: Medicare Other | Admitting: Licensed Clinical Social Worker

## 2018-10-23 DIAGNOSIS — N186 End stage renal disease: Secondary | ICD-10-CM | POA: Diagnosis not present

## 2018-10-23 DIAGNOSIS — Z992 Dependence on renal dialysis: Secondary | ICD-10-CM | POA: Diagnosis not present

## 2018-10-23 DIAGNOSIS — D631 Anemia in chronic kidney disease: Secondary | ICD-10-CM | POA: Diagnosis not present

## 2018-10-23 DIAGNOSIS — N250 Renal osteodystrophy: Secondary | ICD-10-CM | POA: Diagnosis not present

## 2018-10-23 DIAGNOSIS — D509 Iron deficiency anemia, unspecified: Secondary | ICD-10-CM | POA: Diagnosis not present

## 2018-10-24 ENCOUNTER — Ambulatory Visit: Payer: Medicare Other | Admitting: Family Medicine

## 2018-10-25 DIAGNOSIS — D509 Iron deficiency anemia, unspecified: Secondary | ICD-10-CM | POA: Diagnosis not present

## 2018-10-25 DIAGNOSIS — D631 Anemia in chronic kidney disease: Secondary | ICD-10-CM | POA: Diagnosis not present

## 2018-10-25 DIAGNOSIS — N186 End stage renal disease: Secondary | ICD-10-CM | POA: Diagnosis not present

## 2018-10-25 DIAGNOSIS — N250 Renal osteodystrophy: Secondary | ICD-10-CM | POA: Diagnosis not present

## 2018-10-25 DIAGNOSIS — Z992 Dependence on renal dialysis: Secondary | ICD-10-CM | POA: Diagnosis not present

## 2018-10-28 DIAGNOSIS — N250 Renal osteodystrophy: Secondary | ICD-10-CM | POA: Diagnosis not present

## 2018-10-28 DIAGNOSIS — D509 Iron deficiency anemia, unspecified: Secondary | ICD-10-CM | POA: Diagnosis not present

## 2018-10-28 DIAGNOSIS — D631 Anemia in chronic kidney disease: Secondary | ICD-10-CM | POA: Diagnosis not present

## 2018-10-28 DIAGNOSIS — N186 End stage renal disease: Secondary | ICD-10-CM | POA: Diagnosis not present

## 2018-10-28 DIAGNOSIS — Z992 Dependence on renal dialysis: Secondary | ICD-10-CM | POA: Diagnosis not present

## 2018-10-31 DIAGNOSIS — Z992 Dependence on renal dialysis: Secondary | ICD-10-CM | POA: Diagnosis not present

## 2018-10-31 DIAGNOSIS — N186 End stage renal disease: Secondary | ICD-10-CM | POA: Diagnosis not present

## 2018-10-31 DIAGNOSIS — N250 Renal osteodystrophy: Secondary | ICD-10-CM | POA: Diagnosis not present

## 2018-10-31 DIAGNOSIS — D631 Anemia in chronic kidney disease: Secondary | ICD-10-CM | POA: Diagnosis not present

## 2018-11-01 DIAGNOSIS — N186 End stage renal disease: Secondary | ICD-10-CM | POA: Diagnosis not present

## 2018-11-01 DIAGNOSIS — N250 Renal osteodystrophy: Secondary | ICD-10-CM | POA: Diagnosis not present

## 2018-11-01 DIAGNOSIS — Z992 Dependence on renal dialysis: Secondary | ICD-10-CM | POA: Diagnosis not present

## 2018-11-01 DIAGNOSIS — D631 Anemia in chronic kidney disease: Secondary | ICD-10-CM | POA: Diagnosis not present

## 2018-11-04 DIAGNOSIS — N186 End stage renal disease: Secondary | ICD-10-CM | POA: Diagnosis not present

## 2018-11-04 DIAGNOSIS — Z992 Dependence on renal dialysis: Secondary | ICD-10-CM | POA: Diagnosis not present

## 2018-11-04 DIAGNOSIS — N250 Renal osteodystrophy: Secondary | ICD-10-CM | POA: Diagnosis not present

## 2018-11-04 DIAGNOSIS — D631 Anemia in chronic kidney disease: Secondary | ICD-10-CM | POA: Diagnosis not present

## 2018-11-06 DIAGNOSIS — N186 End stage renal disease: Secondary | ICD-10-CM | POA: Diagnosis not present

## 2018-11-06 DIAGNOSIS — Z992 Dependence on renal dialysis: Secondary | ICD-10-CM | POA: Diagnosis not present

## 2018-11-06 DIAGNOSIS — N250 Renal osteodystrophy: Secondary | ICD-10-CM | POA: Diagnosis not present

## 2018-11-06 DIAGNOSIS — D631 Anemia in chronic kidney disease: Secondary | ICD-10-CM | POA: Diagnosis not present

## 2018-11-08 DIAGNOSIS — Z992 Dependence on renal dialysis: Secondary | ICD-10-CM | POA: Diagnosis not present

## 2018-11-08 DIAGNOSIS — N186 End stage renal disease: Secondary | ICD-10-CM | POA: Diagnosis not present

## 2018-11-08 DIAGNOSIS — D631 Anemia in chronic kidney disease: Secondary | ICD-10-CM | POA: Diagnosis not present

## 2018-11-08 DIAGNOSIS — N250 Renal osteodystrophy: Secondary | ICD-10-CM | POA: Diagnosis not present

## 2018-11-11 DIAGNOSIS — N186 End stage renal disease: Secondary | ICD-10-CM | POA: Diagnosis not present

## 2018-11-11 DIAGNOSIS — N250 Renal osteodystrophy: Secondary | ICD-10-CM | POA: Diagnosis not present

## 2018-11-11 DIAGNOSIS — Z992 Dependence on renal dialysis: Secondary | ICD-10-CM | POA: Diagnosis not present

## 2018-11-11 DIAGNOSIS — D631 Anemia in chronic kidney disease: Secondary | ICD-10-CM | POA: Diagnosis not present

## 2018-11-14 ENCOUNTER — Ambulatory Visit (INDEPENDENT_AMBULATORY_CARE_PROVIDER_SITE_OTHER): Payer: Medicare Other | Admitting: Family Medicine

## 2018-11-14 ENCOUNTER — Other Ambulatory Visit: Payer: Self-pay

## 2018-11-14 DIAGNOSIS — N186 End stage renal disease: Secondary | ICD-10-CM

## 2018-11-14 DIAGNOSIS — M150 Primary generalized (osteo)arthritis: Secondary | ICD-10-CM | POA: Diagnosis not present

## 2018-11-14 DIAGNOSIS — M159 Polyosteoarthritis, unspecified: Secondary | ICD-10-CM

## 2018-11-14 MED ORDER — DICLOFENAC SODIUM 1 % TD GEL: 2.0000 g | Freq: Four times a day (QID) | TRANSDERMAL | 3 refills | Status: DC

## 2018-11-14 NOTE — Progress Notes (Signed)
Telephone visit  Subjective: CC: joint pain PCP: Dettinger, Fransisca Kaufmann, MD OVZ:CHYIFO A Kara Knapp is a 48 y.o. female calls for telephone consult today. Patient provides verbal consent for consult held via phone.  Location of patient: home Location of provider: [facility] Others present for call: none  1. Arthritis Patient reports longstanding history of multiple joint pains.  Primarily the affected joints seem to be in her hands and wrists.  She notes that a relative gave her some Voltaren gel a while back which seemed to help pretty good and she wonders if this can now be prescribed.  She has taken some Tylenol but tries to avoid Tylenol because of known liver disease.  She is unable to tolerate oral NSAIDs secondary to ESRD on hemodialysis.  She does report mild joint swelling but no sensation changes.  No redness.  She is found the tramadol which was prescribed by her PCP to not be especially helpful with the hand pain.  Additionally, she is seen a hand surgeon who offered to give her steroid injections for trigger finger but she was not ready to proceed with this yet.   ROS: Per HPI  Allergies  Allergen Reactions  . Bactrim [Sulfamethoxazole-Trimethoprim] Nausea And Vomiting  . Prednisone Other (See Comments)    "I was wide open and couldn't eat" per pt.    Past Medical History:  Diagnosis Date  . Anemia of chronic disease   . Asthma   . Blind left eye   . Bronchitis   . Cataract   . Cholecystitis, acute 05/26/2013   Status post cholecystectomy  . Chronic abdominal pain   . Chronic diarrhea   . Depression   . Diabetic foot ulcer ([place]) 03/01/2015  . Diastolic heart failure ([place])   . ESRD on hemodialysis ([place])    Started diaylsis 12/29/15  . Essential hypertension   . Fibroids   . Glaucoma   . History of blood transfusion   . History of pneumonia   . Hyperlipidemia   . Insulin-dependent diabetes mellitus with retinopathy ([place])   . Neuropathy   . Osteomyelitis ([place])    Toe on  left foot    Current Outpatient Medications:  .  aspirin 81 MG tablet, Take 81 mg by mouth daily., Disp: , Rfl:  .  azelastine (ASTELIN) 0.1 % nasal spray, Place 1 spray into both nostrils 2 (two) times daily. Use in each nostril as directed, Disp: 30 mL, Rfl: 12 .  brimonidine-timolol (COMBIGAN) 0.2-0.5 % ophthalmic solution, Place 1 drop into both eyes daily. , Disp: , Rfl:  .  cholecalciferol (VITAMIN D) 1000 units tablet, Take 1,000 Units by mouth daily. , Disp: , Rfl:  .  Continuous Blood Gluc Sensor (FREESTYLE LIBRE 14 DAY SENSOR) MISC, Inject 1 each into the skin every 14 (fourteen) days. Use as directed., Disp: 2 each, Rfl: 2 .  gabapentin (NEURONTIN) 100 MG capsule, Take 1 capsule (100 mg total) by mouth 3 (three) times daily., Disp: 90 capsule, Rfl: 3 .  glucose blood (PRODIGY NO CODING BLOOD GLUC) test strip, USE TO CHECK BLOOD SUGAR TWICE DAILY., Disp: 200 each, Rfl: 3 .  Insulin Glargine (LANTUS SOLOSTAR) 100 UNIT/ML Solostar Pen, Inject 18-50 Units into the skin at bedtime. (Patient taking differently: Inject 18 Units into the skin at bedtime. ), Disp: 5 pen, Rfl: 11 .  Insulin Pen Needle (PEN NEEDLES) 31G X 5 MM MISC, 1 Device by Does not apply route daily., Disp: 100 each, Rfl: 3 .  lidocaine-prilocaine (EMLA)  cream, Apply 1 application topically daily as needed (arm port)., Disp: , Rfl:  .  linagliptin (TRADJENTA) 5 MG TABS tablet, Take 1 tablet (5 mg total) by mouth daily., Disp: 90 tablet, Rfl: 3 .  pantoprazole (PROTONIX) 40 MG tablet, TAKE 1 BY MOUTH DAILY 30 minutes before breakfast, Disp: 90 tablet, Rfl: 3 .  PRODIGY TWIST TOP LANCETS 28G MISC, USE TO CHECK BLOOD SUGAR UP TO FOUR TIMES DAILY., Disp: 100 each, Rfl: 1 .  sertraline (ZOLOFT) 100 MG tablet, Take 1 tablet (100 mg total) by mouth daily., Disp: 90 tablet, Rfl: 0 .  sevelamer carbonate (RENVELA) 800 MG tablet, Take 2,400 mg by mouth See admin instructions. Take 2488m by mouth three times daily with means and 8069m with snacks, Disp: , Rfl:  .  traMADol (ULTRAM) 50 MG tablet, Take 1 tablet (50 mg total) by mouth daily as needed., Disp: 30 tablet, Rfl: 2 .  traZODone (DESYREL) 50 MG tablet, TAKE 1/2 TO 1 TABLET BY MOUTH AT BEDTIME AS NEEDED FOR SLEEP., Disp: 30 tablet, Rfl: 2  Assessment/ Plan: [age].o. female   1. Primary osteoarthritis involving multiple joints Area of main concern today is hands and wrists.  Given history of ESRD I agree that oral NSAIDs are not a good option.  Because of her reports of liver disease I would also use Tylenol sparingly.  Though I did review her last liver function tests which were normal.  Voltaren gel prescribed.  Applied to the affected areas up to 4 times daily as needed.  Follow-up PRN - diclofenac sodium (VOLTAREN) 1 % GEL; Apply 2 g topically 4 (four) times daily.  Dispense: 200 g; Refill: 3  2. ESRD (end stage renal disease) (HCC) - diclofenac sodium (VOLTAREN) 1 % GEL; Apply 2 g topically 4 (four) times daily.  Dispense: 200 g; Refill: 3   Start time: 11:24am End time: 11:30am  Total time spent on patient care (including telephone call/ virtual visit): 15 minutes  [phone]

## 2018-11-15 DIAGNOSIS — D631 Anemia in chronic kidney disease: Secondary | ICD-10-CM | POA: Diagnosis not present

## 2018-11-15 DIAGNOSIS — Z992 Dependence on renal dialysis: Secondary | ICD-10-CM | POA: Diagnosis not present

## 2018-11-15 DIAGNOSIS — N186 End stage renal disease: Secondary | ICD-10-CM | POA: Diagnosis not present

## 2018-11-15 DIAGNOSIS — N250 Renal osteodystrophy: Secondary | ICD-10-CM | POA: Diagnosis not present

## 2018-11-17 ENCOUNTER — Other Ambulatory Visit: Payer: Self-pay | Admitting: Family Medicine

## 2018-11-17 DIAGNOSIS — Z992 Dependence on renal dialysis: Secondary | ICD-10-CM | POA: Diagnosis not present

## 2018-11-17 DIAGNOSIS — N186 End stage renal disease: Secondary | ICD-10-CM | POA: Diagnosis not present

## 2018-11-17 DIAGNOSIS — E8779 Other fluid overload: Secondary | ICD-10-CM | POA: Diagnosis not present

## 2018-11-18 DIAGNOSIS — N186 End stage renal disease: Secondary | ICD-10-CM | POA: Diagnosis not present

## 2018-11-18 DIAGNOSIS — Z992 Dependence on renal dialysis: Secondary | ICD-10-CM | POA: Diagnosis not present

## 2018-11-18 DIAGNOSIS — D631 Anemia in chronic kidney disease: Secondary | ICD-10-CM | POA: Diagnosis not present

## 2018-11-18 DIAGNOSIS — N250 Renal osteodystrophy: Secondary | ICD-10-CM | POA: Diagnosis not present

## 2018-11-18 NOTE — Telephone Encounter (Signed)
Last office visit 11/14/2018

## 2018-11-18 NOTE — Telephone Encounter (Signed)
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  He will need to provide refills of this medication.

## 2018-11-20 DIAGNOSIS — N186 End stage renal disease: Secondary | ICD-10-CM | POA: Diagnosis not present

## 2018-11-20 DIAGNOSIS — N250 Renal osteodystrophy: Secondary | ICD-10-CM | POA: Diagnosis not present

## 2018-11-20 DIAGNOSIS — D631 Anemia in chronic kidney disease: Secondary | ICD-10-CM | POA: Diagnosis not present

## 2018-11-20 DIAGNOSIS — Z992 Dependence on renal dialysis: Secondary | ICD-10-CM | POA: Diagnosis not present

## 2018-11-22 DIAGNOSIS — D631 Anemia in chronic kidney disease: Secondary | ICD-10-CM | POA: Diagnosis not present

## 2018-11-22 DIAGNOSIS — N250 Renal osteodystrophy: Secondary | ICD-10-CM | POA: Diagnosis not present

## 2018-11-22 DIAGNOSIS — Z992 Dependence on renal dialysis: Secondary | ICD-10-CM | POA: Diagnosis not present

## 2018-11-22 DIAGNOSIS — N186 End stage renal disease: Secondary | ICD-10-CM | POA: Diagnosis not present

## 2018-11-25 DIAGNOSIS — D631 Anemia in chronic kidney disease: Secondary | ICD-10-CM | POA: Diagnosis not present

## 2018-11-25 DIAGNOSIS — N250 Renal osteodystrophy: Secondary | ICD-10-CM | POA: Diagnosis not present

## 2018-11-25 DIAGNOSIS — N186 End stage renal disease: Secondary | ICD-10-CM | POA: Diagnosis not present

## 2018-11-25 DIAGNOSIS — Z992 Dependence on renal dialysis: Secondary | ICD-10-CM | POA: Diagnosis not present

## 2018-11-27 DIAGNOSIS — N186 End stage renal disease: Secondary | ICD-10-CM | POA: Diagnosis not present

## 2018-11-27 DIAGNOSIS — D631 Anemia in chronic kidney disease: Secondary | ICD-10-CM | POA: Diagnosis not present

## 2018-11-27 DIAGNOSIS — N250 Renal osteodystrophy: Secondary | ICD-10-CM | POA: Diagnosis not present

## 2018-11-27 DIAGNOSIS — Z992 Dependence on renal dialysis: Secondary | ICD-10-CM | POA: Diagnosis not present

## 2018-11-29 DIAGNOSIS — Z992 Dependence on renal dialysis: Secondary | ICD-10-CM | POA: Diagnosis not present

## 2018-11-29 DIAGNOSIS — N186 End stage renal disease: Secondary | ICD-10-CM | POA: Diagnosis not present

## 2018-11-29 DIAGNOSIS — D631 Anemia in chronic kidney disease: Secondary | ICD-10-CM | POA: Diagnosis not present

## 2018-11-29 DIAGNOSIS — N250 Renal osteodystrophy: Secondary | ICD-10-CM | POA: Diagnosis not present

## 2018-12-01 ENCOUNTER — Encounter: Payer: Self-pay | Admitting: Family Medicine

## 2018-12-01 ENCOUNTER — Ambulatory Visit (INDEPENDENT_AMBULATORY_CARE_PROVIDER_SITE_OTHER): Payer: Medicare Other | Admitting: Family Medicine

## 2018-12-01 ENCOUNTER — Other Ambulatory Visit: Payer: Self-pay

## 2018-12-01 DIAGNOSIS — F331 Major depressive disorder, recurrent, moderate: Secondary | ICD-10-CM

## 2018-12-01 DIAGNOSIS — N186 End stage renal disease: Secondary | ICD-10-CM

## 2018-12-01 DIAGNOSIS — H548 Legal blindness, as defined in USA: Secondary | ICD-10-CM

## 2018-12-01 DIAGNOSIS — E1142 Type 2 diabetes mellitus with diabetic polyneuropathy: Secondary | ICD-10-CM | POA: Diagnosis not present

## 2018-12-01 DIAGNOSIS — I10 Essential (primary) hypertension: Secondary | ICD-10-CM | POA: Diagnosis not present

## 2018-12-01 DIAGNOSIS — E1122 Type 2 diabetes mellitus with diabetic chronic kidney disease: Secondary | ICD-10-CM | POA: Diagnosis not present

## 2018-12-01 DIAGNOSIS — E782 Mixed hyperlipidemia: Secondary | ICD-10-CM | POA: Diagnosis not present

## 2018-12-01 DIAGNOSIS — R296 Repeated falls: Secondary | ICD-10-CM | POA: Diagnosis not present

## 2018-12-01 NOTE — Progress Notes (Signed)
Virtual Visit via telephone Note  I connected with Kara Knapp on 12/01/18 at 1345 by telephone and verified that I am speaking with the correct person using two identifiers. Kara Knapp is currently located at home and no other people are currently with her during visit. The provider, Fransisca Kaufmann Graycie Halley, MD is located in their office at time of visit.  Call ended at 1401  I discussed the limitations, risks, security and privacy concerns of performing an evaluation and management service by telephone and the availability of in person appointments. I also discussed with the patient that there may be a patient responsible charge related to this service. The patient expressed understanding and agreed to proceed.   History and Present Illness: Type 2 diabetes mellitus Patient comes in today for recheck of his diabetes. Patient has been currently taking lantus and tradjenta. Patient is not currently on an ACE inhibitor/ARB. Patient has not seen an ophthalmologist this year. Patient denies any issues with their feet but does have neuropathy.   Hyperlipidemia Patient is coming in for recheck of his hyperlipidemia. The patient is currently taking no medication. They deny any issues with myalgias or history of liver damage from it. They deny any focal numbness or weakness or chest pain.   Hypertension Patient is currently on no medication, and their blood pressure today is 120/70. Patient denies any lightheadedness or dizziness. Patient denies headaches, blurred vision, chest pains, shortness of breath, or weakness. Denies any side effects from medication and is content with current medication.   No diagnosis found.  Outpatient Encounter Medications as of 12/01/2018  Medication Sig  . aspirin 81 MG tablet Take 81 mg by mouth daily.  Marland Kitchen azelastine (ASTELIN) 0.1 % nasal spray Place 1 spray into both nostrils 2 (two) times daily. Use in each nostril as directed  . brimonidine-timolol  (COMBIGAN) 0.2-0.5 % ophthalmic solution Place 1 drop into both eyes daily.   . cholecalciferol (VITAMIN D) 1000 units tablet Take 1,000 Units by mouth daily.   . Continuous Blood Gluc Sensor (FREESTYLE LIBRE 14 DAY SENSOR) MISC Inject 1 each into the skin every 14 (fourteen) days. Use as directed.  . diclofenac sodium (VOLTAREN) 1 % GEL Apply 2 g topically 4 (four) times daily.  Marland Kitchen gabapentin (NEURONTIN) 100 MG capsule Take 1 capsule (100 mg total) by mouth 3 (three) times daily.  Marland Kitchen glucose blood (PRODIGY NO CODING BLOOD GLUC) test strip USE TO CHECK BLOOD SUGAR TWICE DAILY.  Marland Kitchen Insulin Glargine (LANTUS SOLOSTAR) 100 UNIT/ML Solostar Pen Inject 18-50 Units into the skin at bedtime. (Patient taking differently: Inject 18 Units into the skin at bedtime. )  . Insulin Pen Needle (PEN NEEDLES) 31G X 5 MM MISC 1 Device by Does not apply route daily.  Marland Kitchen lidocaine-prilocaine (EMLA) cream Apply 1 application topically daily as needed (arm port).  Marland Kitchen linagliptin (TRADJENTA) 5 MG TABS tablet Take 1 tablet (5 mg total) by mouth daily.  . pantoprazole (PROTONIX) 40 MG tablet TAKE 1 BY MOUTH DAILY 30 minutes before breakfast  . PRODIGY TWIST TOP LANCETS 28G MISC USE TO CHECK BLOOD SUGAR UP TO FOUR TIMES DAILY.  Marland Kitchen sertraline (ZOLOFT) 100 MG tablet Take 1 tablet (100 mg total) by mouth daily.  . sevelamer carbonate (RENVELA) 800 MG tablet Take 2,400 mg by mouth See admin instructions. Take 2480m by mouth three times daily with means and 8074mwith snacks  . traMADol (ULTRAM) 50 MG tablet Take 1 tablet (50 mg total) by mouth  daily as needed.  . traZODone (DESYREL) 50 MG tablet TAKE 1/2 TO 1 TABLET BY MOUTH AT BEDTIME AS NEEDED FOR SLEEP.   No facility-administered encounter medications on file as of 12/01/2018.     Review of Systems  Constitutional: Negative for chills and fever.  Eyes: Negative for visual disturbance.  Respiratory: Negative for chest tightness and shortness of breath.   Cardiovascular: Negative  for chest pain and leg swelling.  Musculoskeletal: Negative for back pain and gait problem.  Skin: Negative for rash.  Neurological: Negative for light-headedness and headaches.  Psychiatric/Behavioral: Negative for agitation and behavioral problems.  All other systems reviewed and are negative.   Observations/Objective: Patient sounds comfortable and in no acute distress  Assessment and Plan: Problem List Items Addressed This Visit      Cardiovascular and Mediastinum   Essential hypertension, benign     Endocrine   Type 2 diabetes mellitus with ESRD (end-stage renal disease) ([place]) - Primary     Nervous and Auditory   Diabetic neuropathy ([place])     Other   Legally blind   Relevant Orders   DME Other see comment   DME Other see comment   Mixed hyperlipidemia   MDD (major depressive disorder), recurrent episode, moderate ([place])    Other Visit Diagnoses    Recurrent falls       Relevant Orders   DME Other see comment   DME Other see comment       Follow Up Instructions:  Follow up in 3 months and    I discussed the assessment and treatment plan with the patient. The patient was provided an opportunity to ask questions and all were answered. The patient agreed with the plan and demonstrated an understanding of the instructions.   The patient was advised to call back or seek an in-person evaluation if the symptoms worsen or if the condition fails to improve as anticipated.  The above assessment and management plan was discussed with the patient. The patient verbalized understanding of and has agreed to the management plan. Patient is aware to call the clinic if symptoms persist or worsen. Patient is aware when to return to the clinic for a follow-up visit. Patient educated on when it is appropriate to go to the emergency department.    I provided 36 minutes of non-face-to-face time during this encounter.    Worthy Rancher, MD

## 2018-12-02 DIAGNOSIS — Z992 Dependence on renal dialysis: Secondary | ICD-10-CM | POA: Diagnosis not present

## 2018-12-02 DIAGNOSIS — N250 Renal osteodystrophy: Secondary | ICD-10-CM | POA: Diagnosis not present

## 2018-12-02 DIAGNOSIS — D631 Anemia in chronic kidney disease: Secondary | ICD-10-CM | POA: Diagnosis not present

## 2018-12-02 DIAGNOSIS — N186 End stage renal disease: Secondary | ICD-10-CM | POA: Diagnosis not present

## 2018-12-04 DIAGNOSIS — D631 Anemia in chronic kidney disease: Secondary | ICD-10-CM | POA: Diagnosis not present

## 2018-12-04 DIAGNOSIS — Z992 Dependence on renal dialysis: Secondary | ICD-10-CM | POA: Diagnosis not present

## 2018-12-04 DIAGNOSIS — N250 Renal osteodystrophy: Secondary | ICD-10-CM | POA: Diagnosis not present

## 2018-12-04 DIAGNOSIS — N186 End stage renal disease: Secondary | ICD-10-CM | POA: Diagnosis not present

## 2018-12-05 DIAGNOSIS — Z716 Tobacco abuse counseling: Secondary | ICD-10-CM | POA: Diagnosis not present

## 2018-12-05 DIAGNOSIS — N186 End stage renal disease: Secondary | ICD-10-CM | POA: Diagnosis not present

## 2018-12-05 DIAGNOSIS — R1310 Dysphagia, unspecified: Secondary | ICD-10-CM | POA: Diagnosis not present

## 2018-12-05 DIAGNOSIS — F17210 Nicotine dependence, cigarettes, uncomplicated: Secondary | ICD-10-CM | POA: Diagnosis not present

## 2018-12-06 DIAGNOSIS — D631 Anemia in chronic kidney disease: Secondary | ICD-10-CM | POA: Diagnosis not present

## 2018-12-06 DIAGNOSIS — Z992 Dependence on renal dialysis: Secondary | ICD-10-CM | POA: Diagnosis not present

## 2018-12-06 DIAGNOSIS — N250 Renal osteodystrophy: Secondary | ICD-10-CM | POA: Diagnosis not present

## 2018-12-06 DIAGNOSIS — N186 End stage renal disease: Secondary | ICD-10-CM | POA: Diagnosis not present

## 2018-12-09 DIAGNOSIS — Z992 Dependence on renal dialysis: Secondary | ICD-10-CM | POA: Diagnosis not present

## 2018-12-09 DIAGNOSIS — N250 Renal osteodystrophy: Secondary | ICD-10-CM | POA: Diagnosis not present

## 2018-12-09 DIAGNOSIS — N186 End stage renal disease: Secondary | ICD-10-CM | POA: Diagnosis not present

## 2018-12-09 DIAGNOSIS — D631 Anemia in chronic kidney disease: Secondary | ICD-10-CM | POA: Diagnosis not present
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
## 2018-12-11 DIAGNOSIS — Z992 Dependence on renal dialysis: Secondary | ICD-10-CM | POA: Diagnosis not present

## 2018-12-11 DIAGNOSIS — N250 Renal osteodystrophy: Secondary | ICD-10-CM | POA: Diagnosis not present

## 2018-12-11 DIAGNOSIS — N186 End stage renal disease: Secondary | ICD-10-CM | POA: Diagnosis not present

## 2018-12-11 DIAGNOSIS — D631 Anemia in chronic kidney disease: Secondary | ICD-10-CM | POA: Diagnosis not present

## 2018-12-13 DIAGNOSIS — D631 Anemia in chronic kidney disease: Secondary | ICD-10-CM | POA: Diagnosis not present

## 2018-12-13 DIAGNOSIS — N250 Renal osteodystrophy: Secondary | ICD-10-CM | POA: Diagnosis not present

## 2018-12-13 DIAGNOSIS — N186 End stage renal disease: Secondary | ICD-10-CM | POA: Diagnosis not present

## 2018-12-13 DIAGNOSIS — Z992 Dependence on renal dialysis: Secondary | ICD-10-CM | POA: Diagnosis not present

## 2018-12-15 ENCOUNTER — Other Ambulatory Visit: Payer: Self-pay

## 2018-12-15 ENCOUNTER — Other Ambulatory Visit: Payer: Self-pay | Admitting: Family Medicine

## 2018-12-15 ENCOUNTER — Ambulatory Visit (INDEPENDENT_AMBULATORY_CARE_PROVIDER_SITE_OTHER): Payer: Medicare Other | Admitting: Psychiatry

## 2018-12-15 ENCOUNTER — Encounter (HOSPITAL_COMMUNITY): Payer: Self-pay | Admitting: Psychiatry

## 2018-12-15 DIAGNOSIS — F330 Major depressive disorder, recurrent, mild: Secondary | ICD-10-CM

## 2018-12-15 MED ORDER — SERTRALINE HCL 100 MG PO TABS: 150.0000 mg | ORAL_TABLET | Freq: Every day | ORAL | 0 refills | Status: DC

## 2018-12-15 NOTE — Patient Instructions (Signed)
1. Increase sertraline 150 mg daily  2. Next appointment: 7/13 at 9 AM

## 2018-12-16 DIAGNOSIS — Z992 Dependence on renal dialysis: Secondary | ICD-10-CM | POA: Diagnosis not present

## 2018-12-16 DIAGNOSIS — N250 Renal osteodystrophy: Secondary | ICD-10-CM | POA: Diagnosis not present

## 2018-12-16 DIAGNOSIS — D631 Anemia in chronic kidney disease: Secondary | ICD-10-CM | POA: Diagnosis not present

## 2018-12-16 DIAGNOSIS — N186 End stage renal disease: Secondary | ICD-10-CM | POA: Diagnosis not present

## 2018-12-18 DIAGNOSIS — N250 Renal osteodystrophy: Secondary | ICD-10-CM | POA: Diagnosis not present

## 2018-12-18 DIAGNOSIS — Z992 Dependence on renal dialysis: Secondary | ICD-10-CM | POA: Diagnosis not present

## 2018-12-18 DIAGNOSIS — N186 End stage renal disease: Secondary | ICD-10-CM | POA: Diagnosis not present

## 2018-12-18 DIAGNOSIS — D631 Anemia in chronic kidney disease: Secondary | ICD-10-CM | POA: Diagnosis not present

## 2018-12-20 DIAGNOSIS — N250 Renal osteodystrophy: Secondary | ICD-10-CM | POA: Diagnosis not present

## 2018-12-20 DIAGNOSIS — D631 Anemia in chronic kidney disease: Secondary | ICD-10-CM | POA: Diagnosis not present

## 2018-12-20 DIAGNOSIS — N186 End stage renal disease: Secondary | ICD-10-CM | POA: Diagnosis not present

## 2018-12-20 DIAGNOSIS — Z992 Dependence on renal dialysis: Secondary | ICD-10-CM | POA: Diagnosis not present

## 2018-12-23 DIAGNOSIS — N250 Renal osteodystrophy: Secondary | ICD-10-CM | POA: Diagnosis not present

## 2018-12-23 DIAGNOSIS — D631 Anemia in chronic kidney disease: Secondary | ICD-10-CM | POA: Diagnosis not present

## 2018-12-23 DIAGNOSIS — Z992 Dependence on renal dialysis: Secondary | ICD-10-CM | POA: Diagnosis not present

## 2018-12-23 DIAGNOSIS — N186 End stage renal disease: Secondary | ICD-10-CM | POA: Diagnosis not present

## 2018-12-24 ENCOUNTER — Other Ambulatory Visit: Payer: Self-pay

## 2018-12-24 ENCOUNTER — Encounter (HOSPITAL_COMMUNITY): Payer: Self-pay | Admitting: Licensed Clinical Social Worker

## 2018-12-24 ENCOUNTER — Ambulatory Visit (INDEPENDENT_AMBULATORY_CARE_PROVIDER_SITE_OTHER): Payer: Medicare Other | Admitting: Licensed Clinical Social Worker

## 2018-12-24 DIAGNOSIS — F330 Major depressive disorder, recurrent, mild: Secondary | ICD-10-CM
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
## 2018-12-25 ENCOUNTER — Other Ambulatory Visit (HOSPITAL_COMMUNITY): Payer: Self-pay | Admitting: Nephrology

## 2018-12-25 ENCOUNTER — Other Ambulatory Visit: Payer: Self-pay | Admitting: Radiology

## 2018-12-25 ENCOUNTER — Other Ambulatory Visit: Payer: Self-pay | Admitting: Student

## 2018-12-25 DIAGNOSIS — N186 End stage renal disease: Secondary | ICD-10-CM

## 2018-12-26 ENCOUNTER — Other Ambulatory Visit: Payer: Self-pay

## 2018-12-26 ENCOUNTER — Ambulatory Visit (HOSPITAL_COMMUNITY)
Admission: RE | Admit: 2018-12-26 | Discharge: 2018-12-26 | Disposition: A | Discharge status: Home / Self Care | Payer: Medicare Other | Source: Ambulatory Visit | Attending: Nephrology | Admitting: Nephrology

## 2018-12-26 ENCOUNTER — Encounter (HOSPITAL_COMMUNITY): Payer: Self-pay

## 2018-12-26 ENCOUNTER — Other Ambulatory Visit (HOSPITAL_COMMUNITY): Payer: Self-pay | Admitting: Nephrology

## 2018-12-26 DIAGNOSIS — T82868A Thrombosis of vascular prosthetic devices, implants and grafts, initial encounter: Secondary | ICD-10-CM | POA: Insufficient documentation

## 2018-12-26 DIAGNOSIS — Y841 Kidney dialysis as the cause of abnormal reaction of the patient, or of later complication, without mention of misadventure at the time of the procedure: Secondary | ICD-10-CM | POA: Insufficient documentation

## 2018-12-26 DIAGNOSIS — E785 Hyperlipidemia, unspecified: Secondary | ICD-10-CM | POA: Insufficient documentation

## 2018-12-26 DIAGNOSIS — F17210 Nicotine dependence, cigarettes, uncomplicated: Secondary | ICD-10-CM | POA: Diagnosis not present

## 2018-12-26 DIAGNOSIS — E1140 Type 2 diabetes mellitus with diabetic neuropathy, unspecified: Secondary | ICD-10-CM | POA: Insufficient documentation

## 2018-12-26 DIAGNOSIS — E11319 Type 2 diabetes mellitus with unspecified diabetic retinopathy without macular edema: Secondary | ICD-10-CM | POA: Insufficient documentation

## 2018-12-26 DIAGNOSIS — N186 End stage renal disease: Secondary | ICD-10-CM

## 2018-12-26 DIAGNOSIS — F329 Major depressive disorder, single episode, unspecified: Secondary | ICD-10-CM | POA: Diagnosis not present

## 2018-12-26 DIAGNOSIS — Z992 Dependence on renal dialysis: Secondary | ICD-10-CM | POA: Diagnosis not present

## 2018-12-26 DIAGNOSIS — E1122 Type 2 diabetes mellitus with diabetic chronic kidney disease: Secondary | ICD-10-CM | POA: Insufficient documentation

## 2018-12-26 DIAGNOSIS — D631 Anemia in chronic kidney disease: Secondary | ICD-10-CM | POA: Diagnosis not present

## 2018-12-26 DIAGNOSIS — J45909 Unspecified asthma, uncomplicated: Secondary | ICD-10-CM | POA: Diagnosis not present

## 2018-12-26 DIAGNOSIS — Z794 Long term (current) use of insulin: Secondary | ICD-10-CM | POA: Diagnosis not present

## 2018-12-26 DIAGNOSIS — I120 Hypertensive chronic kidney disease with stage 5 chronic kidney disease or end stage renal disease: Secondary | ICD-10-CM | POA: Insufficient documentation

## 2018-12-26 DIAGNOSIS — Z79899 Other long term (current) drug therapy: Secondary | ICD-10-CM | POA: Insufficient documentation

## 2018-12-26 DIAGNOSIS — I5030 Unspecified diastolic (congestive) heart failure: Secondary | ICD-10-CM | POA: Diagnosis not present

## 2018-12-26 DIAGNOSIS — Z7982 Long term (current) use of aspirin: Secondary | ICD-10-CM | POA: Insufficient documentation

## 2018-12-26 HISTORY — PX: IR THROMBECTOMY AV FISTULA W/THROMBOLYSIS/PTA INC/SHUNT/IMG RIGHT: IMG6119

## 2018-12-26 HISTORY — PX: IR US GUIDE VASC ACCESS RIGHT: IMG2390

## 2018-12-26 LAB — BASIC METABOLIC PANEL
Anion gap: 14 (ref 5–15)
BUN: 53 mg/dL — ABNORMAL HIGH (ref 6–20)
CO2: 24 mmol/L (ref 22–32)
Calcium: 9.2 mg/dL (ref 8.9–10.3)
Chloride: 99 mmol/L (ref 98–111)
Creatinine, Ser: 7.36 mg/dL — ABNORMAL HIGH (ref 0.44–1.00)
GFR calc Af Amer: 7 mL/min — ABNORMAL LOW (ref >60)
GFR calc non Af Amer: 6 mL/min — ABNORMAL LOW (ref >60)
Glucose, Bld: 142 mg/dL — ABNORMAL HIGH (ref 70–99)
Potassium: 4.8 mmol/L (ref 3.5–5.1)
Sodium: 137 mmol/L (ref 135–145)

## 2018-12-26 LAB — GLUCOSE, CAPILLARY
Glucose-Capillary: 140 mg/dL — ABNORMAL HIGH (ref 70–99)
Glucose-Capillary: 110 mg/dL — ABNORMAL HIGH (ref 70–99)

## 2018-12-26 MED ORDER — SODIUM CHLORIDE 0.9 % IV SOLN: INTRAVENOUS | Status: DC

## 2018-12-26 MED ORDER — IOHEXOL 300 MG/ML  SOLN
100.0000 mL | Freq: Once | INTRAMUSCULAR | Status: AC | PRN
  Administered 2018-12-26: 65 mL via INTRAVENOUS

## 2018-12-26 MED ORDER — ALTEPLASE 2 MG IJ SOLR
INTRAMUSCULAR | Status: AC | PRN
  Administered 2018-12-26: 2 mg

## 2018-12-26 MED ORDER — HYDRALAZINE HCL 20 MG/ML IJ SOLN
INTRAMUSCULAR | Status: AC
  Filled 2018-12-26: qty 1

## 2018-12-26 MED ORDER — FENTANYL CITRATE (PF) 100 MCG/2ML IJ SOLN
INTRAMUSCULAR | Status: AC
  Filled 2018-12-26: qty 2

## 2018-12-26 MED ORDER — ALTEPLASE 2 MG IJ SOLR
INTRAMUSCULAR | Status: AC
  Filled 2018-12-26: qty 2

## 2018-12-26 MED ORDER — LIDOCAINE HCL 1 % IJ SOLN
INTRAMUSCULAR | Status: AC | PRN
  Administered 2018-12-26: 10 mL

## 2018-12-26 MED ORDER — IOHEXOL 300 MG/ML  SOLN
50.0000 mL | Freq: Once | INTRAMUSCULAR | Status: AC | PRN
  Administered 2018-12-26: 30 mL via INTRAVENOUS

## 2018-12-26 MED ORDER — MIDAZOLAM HCL 2 MG/2ML IJ SOLN
INTRAMUSCULAR | Status: AC
  Filled 2018-12-26: qty 2

## 2018-12-26 MED ORDER — FENTANYL CITRATE (PF) 100 MCG/2ML IJ SOLN
INTRAMUSCULAR | Status: AC | PRN
  Administered 2018-12-26: 50 ug via INTRAVENOUS
  Administered 2018-12-26 (×2): 25 ug via INTRAVENOUS

## 2018-12-26 MED ORDER — LIDOCAINE HCL 1 % IJ SOLN
INTRAMUSCULAR | Status: AC
  Filled 2018-12-26: qty 20

## 2018-12-26 MED ORDER — HEPARIN SODIUM (PORCINE) 1000 UNIT/ML IJ SOLN
INTRAMUSCULAR | Status: AC
  Filled 2018-12-26: qty 1

## 2018-12-26 MED ORDER — HEPARIN SODIUM (PORCINE) 1000 UNIT/ML IJ SOLN
INTRAMUSCULAR | Status: AC | PRN
  Administered 2018-12-26: 3000 [IU] via INTRAVENOUS

## 2018-12-26 MED ORDER — MIDAZOLAM HCL 2 MG/2ML IJ SOLN
INTRAMUSCULAR | Status: AC | PRN
  Administered 2018-12-26 (×2): 0.5 mg via INTRAVENOUS
  Administered 2018-12-26: 1 mg via INTRAVENOUS

## 2018-12-26 MED ORDER — HYDRALAZINE HCL 20 MG/ML IJ SOLN
INTRAMUSCULAR | Status: AC | PRN
  Administered 2018-12-26 (×2): 10 mg via INTRAVENOUS

## 2018-12-26 NOTE — Discharge Instructions (Signed)

## 2018-12-26 NOTE — Procedures (Signed)
Interventional Radiology Procedure:   Indications: Thrombosis right arm AV graft  Procedure: Right arm AV graft declot with PTA and thrombolysis  Findings: PTA of graft and outflow stent.   Graft is widely patent at end of procedure.  Central veins patent  Complications: None     EBL: Less than 20 ml  Plan: Discharge to home in 1 hour.  Removed sutures tomorrow at dialysis.   Ashlley Booher R. Anselm Pancoast, MD  Pager: [phone]

## 2018-12-26 NOTE — H&P (Signed)
Chief Complaint: Patient was seen in consultation today for right arm dialysis access thrombolysis  at the request of [facility]  Referring Physician(s): Fran Lowes  Supervising Physician: Markus Daft  Patient Status: [facility] - Out-pt  History of Present Illness: Kara Knapp is a 49 y.o. female   Right arm dialysis access clotted Last use Tues- no issues Thursday clotted Now scheduled for right arm dialysis graft thrombolysis with possible angioplasty/stent placement Possible tunneled dialysis catheter placement  Last intervention 08/08/18:  IMPRESSION: 1. Thrombosed radial artery to brachial vein straight graft. 2. Recurrent high-grade (approximately 76%) venous anastomotic stenosis. 3. Irregular stenosis within the mid body of the graft. 4. Placement of an 8 x 60 mm Covera stent graft across the multiply recurrent venous anastomotic stenosis with complete resolution of the stenosis. 5. Successful balloon angioplasty of the in grafts stenosis to 6 mm. ACCESS: This access remains amenable to future percutaneous interventions as clinically indicated.  Now scheduled for evaluation and possible intervention   Past Medical History:  Diagnosis Date  . Anemia of chronic disease   . Asthma   . Blind left eye   . Bronchitis   . Cataract   . Cholecystitis, acute 05/26/2013   Status post cholecystectomy  . Chronic abdominal pain   . Chronic diarrhea   . Depression   . Diabetic foot ulcer ([place]) 03/01/2015  . Diastolic heart failure ([place])   . ESRD on hemodialysis ([place])    Started diaylsis 12/29/15  . Essential hypertension   . Fibroids   . Glaucoma   . History of blood transfusion   . History of pneumonia   . Hyperlipidemia   . Insulin-dependent diabetes mellitus with retinopathy ([place])   . Neuropathy   . Osteomyelitis ([place])    Toe on left foot    Past Surgical History:  Procedure Laterality Date  . A/V SHUNTOGRAM N/A 10/25/2016   Procedure: A/V  Shuntogram - Right Arm;  Surgeon: Waynetta Sandy, MD;  Location: [place] [facility];  Service: Cardiovascular;  Laterality: N/A;  . A/V SHUNTOGRAM N/A 03/05/2018   Procedure: A/V SHUNTOGRAM - Right Arm;  Surgeon: Waynetta Sandy, MD;  Location: [place] [facility];  Service: Cardiovascular;  Laterality: N/A;  . AV FISTULA PLACEMENT Right 10/17/2015   Procedure: INSERTION OF ARTERIOVENOUS GORE-TEX GRAFT RIGHT UPPER ARM WITH ACUSEAL;  Surgeon: Conrad Smithton, MD;  Location: [place];  Service: Vascular;  Laterality: Right;  . CATARACT EXTRACTION W/ INTRAOCULAR LENS IMPLANT Bilateral   . CESAREAN SECTION    . CHOLECYSTECTOMY N/A 05/25/2013   Procedure: LAPAROSCOPIC CHOLECYSTECTOMY;  Surgeon: Jamesetta So, MD;  [facility];  Service: General;  Laterality: N/A;  . COLONOSCOPY WITH PROPOFOL N/A 10/02/2016   Procedure: COLONOSCOPY WITH PROPOFOL;  Surgeon: Danie Binder, MD;  [facility];  Service: Endoscopy;  Laterality: N/A;  145 - pt knows to arrive at 11:15 per office  . ESOPHAGOGASTRODUODENOSCOPY (EGD) WITH PROPOFOL N/A 10/02/2016   Procedure: ESOPHAGOGASTRODUODENOSCOPY (EGD) WITH PROPOFOL;  Surgeon: Danie Binder, MD;  [facility];  Service: Endoscopy;  Laterality: N/A;  . EYE SURGERY Bilateral   . IR THROMBECTOMY AV FISTULA W/THROMBOLYSIS/PTA/STENT INC/SHUNT/IMG RT Right 08/08/2018  . IR US GUIDE VASC ACCESS RIGHT  08/08/2018  . PARS PLANA VITRECTOMY Left 11/24/2014   Procedure: PARS PLANA VITRECTOMY WITH 25 GAUGE;  Surgeon: Hurman Horn, MD;  Location: [place];  Service: Ophthalmology;  Laterality: Left;  . PERIPHERAL VASCULAR BALLOON ANGIOPLASTY Right 03/05/2018  Procedure: PERIPHERAL VASCULAR BALLOON ANGIOPLASTY;  Surgeon: Waynetta Sandy, MD;  Location: [place] [facility];  Service: Cardiovascular;  Laterality: Right;  arm fistula  . PERIPHERAL VASCULAR CATHETERIZATION N/A 04/28/2015   Procedure: Bilateral Upper Extremity Venography;  Surgeon: Conrad [place], MD;  Location: [place] [facility];  Service: Cardiovascular;  Laterality: N/A;  . PHOTOCOAGULATION WITH LASER Left 11/24/2014   Procedure: PHOTOCOAGULATION WITH LASER;  Surgeon: Hurman Horn, MD;  Location: [place];  Service: Ophthalmology;  Laterality: Left;  with insertion of silicone oil  . SAVORY DILATION N/A 10/02/2016   Procedure: SAVORY DILATION;  Surgeon: Danie Binder, MD;  [facility];  Service: Endoscopy;  Laterality: N/A;  . TUBAL LIGATION      Allergies: Bactrim [sulfamethoxazole-trimethoprim] and Prednisone  Medications: Prior to Admission medications   Medication Sig Start Date End Date Taking? Authorizing Provider  aspirin 81 MG tablet Take 81 mg by mouth daily.   Yes [provider]  brimonidine-timolol (COMBIGAN) 0.2-0.5 % ophthalmic solution Place 1 drop into both eyes daily.    Yes [provider]  cholecalciferol (VITAMIN D) 1000 units tablet Take 1,000 Units by mouth daily.    Yes [provider]  diclofenac sodium (VOLTAREN) 1 % GEL Apply 2 g topically 4 (four) times daily. 11/14/18  Yes Gottschalk, Ashly M, DO  gabapentin (NEURONTIN) 100 MG capsule TAKE ONE CAPSULE BY MOUTH THREE TIMES DAILY. 12/15/18  Yes Dettinger, Fransisca Kaufmann, MD  Insulin Glargine (LANTUS SOLOSTAR) 100 UNIT/ML Solostar Pen Inject 18-50 Units into the skin at bedtime. Patient taking differently: Inject 18 Units into the skin at bedtime.  08/29/18  Yes Dettinger, Fransisca Kaufmann, MD  lidocaine-prilocaine (EMLA) cream Apply 1 application topically daily as needed (arm port).   Yes [provider]  linagliptin (TRADJENTA) 5 MG TABS tablet Take 1 tablet (5 mg total) by mouth daily. 08/29/18  Yes Dettinger, Fransisca Kaufmann, MD  pantoprazole (PROTONIX) 40 MG tablet TAKE 1 BY MOUTH DAILY 30 minutes before breakfast 01/29/18  Yes Annitta Needs, NP  sertraline (ZOLOFT) 100 MG tablet Take 1.5 tablets (150 mg total) by mouth daily. 12/15/18  Yes Hisada, Elie Goody, MD  sevelamer  carbonate (RENVELA) 800 MG tablet Take 2,400 mg by mouth See admin instructions. Take 2434m by mouth three times daily with means and 8039mwith snacks   Yes [provider]  traMADol (ULTRAM) 50 MG tablet Take 1 tablet (50 mg total) by mouth daily as needed. 08/29/18  Yes Dettinger, JoFransisca KaufmannMD  azelastine (ASTELIN) 0.1 % nasal spray Place 1 spray into both nostrils 2 (two) times daily. Use in each nostril as directed 08/29/18   Dettinger, JoFransisca KaufmannMD  Continuous Blood Gluc Sensor (FREESTYLE LIBRE 14 DAY SENSOR) MISC Inject 1 each into the skin every 14 (fourteen) days. Use as directed. 11/27/17   NiCassandria AngerMD  glucose blood (PRODIGY NO CODING BLOOD GLUC) test strip USE TO CHECK BLOOD SUGAR TWICE DAILY. 10/28/17   BrTimmothy EulerMD  Insulin Pen Needle (PEN NEEDLES) 31G X 5 MM MISC 1 Device by Does not apply route daily. 05/19/18   Dettinger, JoFransisca KaufmannMD  PRODIGY TWIST TOP LANCETS 28G MISC USE TO CHECK BLOOD SUGAR UP TO FOUR TIMES DAILY. 03/13/17   BrTimmothy EulerMD  traZODone (DESYREL) 50 MG tablet TAKE 1/2 TO 1 TABLET BY MOUTH AT BEDTIME AS NEEDED FOR SLEEP. 12/15/18   Dettinger, JoFransisca KaufmannMD     Family  History  Problem Relation Age of Onset  . COPD Mother   . Cancer Father   . Lymphoma Father   . Diabetes Sister   . Deep vein thrombosis Sister   . Diabetes Brother   . Hyperlipidemia Brother   . Hypertension Brother   . Mental retardation Sister   . Alcohol abuse Paternal Grandmother   . Colon cancer Neg Hx   . Liver disease Neg Hx     Social History   Socioeconomic History  . Marital status: Single    Spouse name: Not on file  . Number of children: 5  . Years of education: GED  . Highest education level: Not on file  Occupational History  . Not on file  Social Needs  . Financial resource strain: Not on file  . Food insecurity:    Worry: Not on file    Inability: Not on file  . Transportation needs:    Medical: Not on file    Non-medical: Not  on file  Tobacco Use  . Smoking status: Current Every Day Smoker    Packs/day: 1.50    Years: 23.00    Pack years: 34.50    Types: Cigarettes  . Smokeless tobacco: Never Used  . Tobacco comment: one pack daily  Substance and Sexual Activity  . Alcohol use: No    Alcohol/week: 0.0 standard drinks    Frequency: Never  . Drug use: No    Comment: Sober for 8 years  . Sexual activity: Not Currently    Birth control/protection: Surgical  Lifestyle  . Physical activity:    Days per week: Not on file    Minutes per session: Not on file  . Stress: Not on file  Relationships  . Social connections:    Talks on phone: Not on file    Gets together: Not on file    Attends religious service: Not on file    Active member of club or organization: Not on file    Attends meetings of clubs or organizations: Not on file    Relationship status: Not on file  Other Topics Concern  . Not on file  Social History Narrative  . Not on file    Review of Systems: A 12 point ROS discussed and pertinent positives are indicated in the HPI above.  All other systems are negative.  Review of Systems  Constitutional: Negative for activity change, fatigue and fever.  Respiratory: Negative for shortness of breath.   Gastrointestinal: Negative for abdominal pain.  Neurological: Negative for weakness.  Psychiatric/Behavioral: Negative for behavioral problems and confusion.    Vital Signs: BP (!) 183/93   Pulse 75   Temp 98.2 F (36.8 C) (Oral)   Resp 19   Ht 5' 3"  (1.6 m)   Wt 173 lb (78.5 kg)   LMP 05/05/2015   SpO2 96%   BMI 30.65 kg/m   Physical Exam Vitals signs reviewed.  Cardiovascular:     Rate and Rhythm: Normal rate and regular rhythm.     Heart sounds: Normal heart sounds.  Pulmonary:     Effort: Pulmonary effort is normal.     Breath sounds: Normal breath sounds.  Abdominal:     Tenderness: There is no abdominal tenderness.  Musculoskeletal: Normal range of motion.      Comments: Right arm dialysis graft: no thrill ; no pulse  Skin:    General: Skin is warm and dry.  Neurological:     Mental Status: She is alert  and oriented to person, place, and time.  Psychiatric:        Mood and Affect: Mood normal.        Behavior: Behavior normal.        Thought Content: Thought content normal.        Judgment: Judgment normal.     Imaging: No results found.  Labs:  CBC: Recent Labs    02/28/18 1019 03/05/18 0904 05/14/18 1357 08/08/18 0812 10/01/18 1115  WBC 8.1  --  8.1 7.2 8.6  HGB 11.7* 11.6* 12.1 11.3* 10.9*  HCT 33.9* 34.0* 35.9 34.1* 34.5*  PLT 213  --  171 148* 206    COAGS: Recent Labs    02/07/18 1138 08/08/18 0812  INR 0.99 0.96    BMP: Recent Labs    08/08/18 0812 08/29/18 1406 10/01/18 1115 12/26/18 1157  NA 133* 136 137 137  K 5.0 4.6 4.7 4.8  CL 96* 93* 98 99  CO2 24 22 25 24   GLUCOSE 142* 162* 139* 142*  BUN 57* 40* 27* 53*  CALCIUM 8.1* 9.0 8.6* 9.2  CREATININE 8.33* 5.99* 5.37* 7.36*  GFRNONAA 5* 8* 9* 6*  GFRAA 6* 9* 10* 7*    LIVER FUNCTION TESTS: Recent Labs    02/28/18 1019 05/14/18 1357 08/29/18 1406 10/01/18 1115  BILITOT 0.6 0.3 0.3 0.3  AST 39 26 8 17   ALT 44 25 12 15   ALKPHOS 318* 291* 289* 276*  PROT 8.0 7.3 7.2 8.1  ALBUMIN 4.0 4.4 4.2 4.1    TUMOR MARKERS: No results for input(s): AFPTM, CEA, CA199, CHROMGRNA in the last 8760 hours.  Assessment and Plan:  ESRD Right arm access clotted Scheduled for thrombolysis with possible angioplasty/stent p[lacement. Possible tunneled catheter placement Risks and benefits discussed with the patient including, but not limited to bleeding, infection, vascular injury, pulmonary embolism, need for tunneled HD catheter placement or even death.  All of the patient's questions were answered, patient is agreeable to proceed. Consent signed and in chart.  Thank you for this interesting consult.  I greatly enjoyed meeting Kara Knapp and look  forward to participating in their care.  A copy of this report was sent to the requesting provider on this date.  Electronically Signed: Lavonia Drafts, PA-C 12/26/2018, 1:21 PM   I spent a total of  30 Minutes   in face to face in clinical consultation, greater than 50% of which was counseling/coordinating care for right arm dialysis graft declot

## 2018-12-27 DIAGNOSIS — N186 End stage renal disease: Secondary | ICD-10-CM | POA: Diagnosis not present

## 2018-12-27 DIAGNOSIS — Z992 Dependence on renal dialysis: Secondary | ICD-10-CM | POA: Diagnosis not present

## 2018-12-27 DIAGNOSIS — D631 Anemia in chronic kidney disease: Secondary | ICD-10-CM | POA: Diagnosis not present

## 2018-12-27 DIAGNOSIS — N250 Renal osteodystrophy: Secondary | ICD-10-CM | POA: Diagnosis not present

## 2018-12-28 DIAGNOSIS — N186 End stage renal disease: Secondary | ICD-10-CM | POA: Diagnosis not present

## 2018-12-28 DIAGNOSIS — Z992 Dependence on renal dialysis: Secondary | ICD-10-CM | POA: Diagnosis not present

## 2018-12-30 ENCOUNTER — Other Ambulatory Visit (HOSPITAL_COMMUNITY): Payer: Self-pay | Admitting: Urology

## 2018-12-30 DIAGNOSIS — N186 End stage renal disease: Secondary | ICD-10-CM | POA: Diagnosis not present

## 2018-12-30 DIAGNOSIS — D631 Anemia in chronic kidney disease: Secondary | ICD-10-CM | POA: Diagnosis not present

## 2018-12-30 DIAGNOSIS — Z992 Dependence on renal dialysis: Secondary | ICD-10-CM | POA: Diagnosis not present

## 2018-12-30 DIAGNOSIS — N250 Renal osteodystrophy: Secondary | ICD-10-CM | POA: Diagnosis not present

## 2018-12-30 DIAGNOSIS — D509 Iron deficiency anemia, unspecified: Secondary | ICD-10-CM | POA: Diagnosis not present

## 2018-12-31 ENCOUNTER — Other Ambulatory Visit (HOSPITAL_COMMUNITY): Payer: Self-pay | Admitting: Urology

## 2018-12-31 DIAGNOSIS — D3000 Benign neoplasm of unspecified kidney: Secondary | ICD-10-CM

## 2018-12-31 DIAGNOSIS — D3002 Benign neoplasm of left kidney: Secondary | ICD-10-CM

## 2019-01-01 DIAGNOSIS — E113511 Type 2 diabetes mellitus with proliferative diabetic retinopathy with macular edema, right eye: Secondary | ICD-10-CM | POA: Diagnosis not present

## 2019-01-01 DIAGNOSIS — H4051X3 Glaucoma secondary to other eye disorders, right eye, severe stage: Secondary | ICD-10-CM | POA: Diagnosis not present

## 2019-01-01 DIAGNOSIS — E1139 Type 2 diabetes mellitus with other diabetic ophthalmic complication: Secondary | ICD-10-CM | POA: Diagnosis not present

## 2019-01-01 DIAGNOSIS — H211X1 Other vascular disorders of iris and ciliary body, right eye: Secondary | ICD-10-CM | POA: Diagnosis not present

## 2019-01-01 DIAGNOSIS — H4311 Vitreous hemorrhage, right eye: Secondary | ICD-10-CM | POA: Diagnosis not present

## 2019-01-03 DIAGNOSIS — D631 Anemia in chronic kidney disease: Secondary | ICD-10-CM | POA: Diagnosis not present

## 2019-01-03 DIAGNOSIS — N186 End stage renal disease: Secondary | ICD-10-CM | POA: Diagnosis not present

## 2019-01-03 DIAGNOSIS — Z992 Dependence on renal dialysis: Secondary | ICD-10-CM | POA: Diagnosis not present

## 2019-01-03 DIAGNOSIS — D509 Iron deficiency anemia, unspecified: Secondary | ICD-10-CM | POA: Diagnosis not present

## 2019-01-03 DIAGNOSIS — N250 Renal osteodystrophy: Secondary | ICD-10-CM | POA: Diagnosis not present

## 2019-01-05 ENCOUNTER — Ambulatory Visit (HOSPITAL_COMMUNITY): Payer: Medicare Other

## 2019-01-06 DIAGNOSIS — D509 Iron deficiency anemia, unspecified: Secondary | ICD-10-CM | POA: Diagnosis not present

## 2019-01-06 DIAGNOSIS — N250 Renal osteodystrophy: Secondary | ICD-10-CM | POA: Diagnosis not present

## 2019-01-06 DIAGNOSIS — N186 End stage renal disease: Secondary | ICD-10-CM | POA: Diagnosis not present

## 2019-01-06 DIAGNOSIS — Z992 Dependence on renal dialysis: Secondary | ICD-10-CM | POA: Diagnosis not present

## 2019-01-06 DIAGNOSIS — D631 Anemia in chronic kidney disease: Secondary | ICD-10-CM | POA: Diagnosis not present

## 2019-01-07 ENCOUNTER — Ambulatory Visit (HOSPITAL_COMMUNITY): Payer: Medicare Other

## 2019-01-08 DIAGNOSIS — Z992 Dependence on renal dialysis: Secondary | ICD-10-CM | POA: Diagnosis not present

## 2019-01-08 DIAGNOSIS — D509 Iron deficiency anemia, unspecified: Secondary | ICD-10-CM | POA: Diagnosis not present

## 2019-01-08 DIAGNOSIS — D631 Anemia in chronic kidney disease: Secondary | ICD-10-CM | POA: Diagnosis not present

## 2019-01-08 DIAGNOSIS — N250 Renal osteodystrophy: Secondary | ICD-10-CM | POA: Diagnosis not present

## 2019-01-08 DIAGNOSIS — N186 End stage renal disease: Secondary | ICD-10-CM | POA: Diagnosis not present

## 2019-01-12 ENCOUNTER — Encounter (HOSPITAL_COMMUNITY): Payer: Self-pay | Admitting: Licensed Clinical Social Worker

## 2019-01-12 ENCOUNTER — Ambulatory Visit (INDEPENDENT_AMBULATORY_CARE_PROVIDER_SITE_OTHER): Payer: Medicaid Other | Admitting: Licensed Clinical Social Worker

## 2019-01-12 ENCOUNTER — Other Ambulatory Visit: Payer: Self-pay

## 2019-01-12 DIAGNOSIS — F330 Major depressive disorder, recurrent, mild: Secondary | ICD-10-CM | POA: Diagnosis not present

## 2019-01-12 NOTE — Progress Notes (Signed)
Virtual Visit via Video Note  I connected with Kara Knapp on 01/12/19 at 10:00 AM EDT by a video enabled telemedicine application and verified that I am speaking with the correct person using two identifiers.  Location: Patient: Home Provider: Office   I discussed the limitations of evaluation and management by telemedicine and the availability of in person appointments. The patient expressed understanding and agreed to proceed.  Participation Level: Active  Behavioral Response: CasualAlertDepressed  Type of Therapy: Individual Therapy  Treatment Goals addressed: Coping  Interventions: CBT and Solution Focused  Summary: Kara Knapp is a 48 y.o. female who presents oriented x5 (person, place, situation, time, and object), casually dressed, appropriately groomed, average height, overweight, and cooperative to address mood. Patient has a history of medical treatment including dialysis. Patient has a minimal history of mental health treatment including medication management. Patient denies suicidal and homicidal ideations. Patient denies psychosis including auditory and visual hallucinations. Patient denies substance abuse. Patient is at low risk for lethality at this time.  Physically: Patient has lost her vision. She has to get shots in her eyes. Patient is more active and smoking less.  Spiritually/values: No issues identified.  Relationships: Patient lost a friend who she used to see at dialysis. She is coping with the lost. Patient has decided to raise her standards with men. She was tired of the type of men that she was dating.  Emotionally/Mentally/Behavior: Patient's mood has been a little down and irritable. Patient recently moved and got a new health aide. She is adjusting to her new place. Patient was able to identify that she wants to focus on having more patience and focus on herself more and what she is doing. Patient doesn't want to live in fear.   Patient engaged in  session. She responded well to interventions. Patient continues to meet criteria for Major depressive disorder, recurrent episode, mild. Patient will continue in outpatient therapy due to being the least restrictive service to meet her needs. Patient made minimal progress on her goals.   Suicidal/Homicidal: Negativewithout intent/plan  Therapist Response: Therapist reviewed patient's recent thoughts and behaviors. Therapist utilized CBT to address mood. Therapist processed patient's recent thoughts to identify triggers for mood. Therapist assisted patient in identifying ways to improve her mood.   Plan: Return again in 2-3 weeks.  Diagnosis: Axis I: Major depressive disorder, recurrent episode, mild    Axis II: No diagnosis  I discussed the assessment and treatment plan with the patient. The patient was provided an opportunity to ask questions and all were answered. The patient agreed with the plan and demonstrated an understanding of the instructions.   The patient was advised to call back or seek an in-person evaluation if the symptoms worsen or if the condition fails to improve as anticipated.  I provided 40 minutes of non-face-to-face time during this encounter.   Glori Bickers, LCSW 01/12/2019

## 2019-01-13 DIAGNOSIS — D509 Iron deficiency anemia, unspecified: Secondary | ICD-10-CM | POA: Diagnosis not present

## 2019-01-13 DIAGNOSIS — N250 Renal osteodystrophy: Secondary | ICD-10-CM | POA: Diagnosis not present

## 2019-01-13 DIAGNOSIS — D631 Anemia in chronic kidney disease: Secondary | ICD-10-CM | POA: Diagnosis not present

## 2019-01-13 DIAGNOSIS — Z992 Dependence on renal dialysis: Secondary | ICD-10-CM | POA: Diagnosis not present

## 2019-01-13 DIAGNOSIS — N186 End stage renal disease: Secondary | ICD-10-CM | POA: Diagnosis not present

## 2019-01-14 ENCOUNTER — Ambulatory Visit (HOSPITAL_COMMUNITY): Admission: RE | Admit: 2019-01-14 | Payer: Medicare Other | Source: Ambulatory Visit

## 2019-01-14 ENCOUNTER — Encounter (HOSPITAL_COMMUNITY): Payer: Self-pay

## 2019-01-15 DIAGNOSIS — Z992 Dependence on renal dialysis: Secondary | ICD-10-CM | POA: Diagnosis not present

## 2019-01-15 DIAGNOSIS — D509 Iron deficiency anemia, unspecified: Secondary | ICD-10-CM | POA: Diagnosis not present

## 2019-01-15 DIAGNOSIS — D631 Anemia in chronic kidney disease: Secondary | ICD-10-CM | POA: Diagnosis not present

## 2019-01-15 DIAGNOSIS — N186 End stage renal disease: Secondary | ICD-10-CM | POA: Diagnosis not present

## 2019-01-15 DIAGNOSIS — N250 Renal osteodystrophy: Secondary | ICD-10-CM | POA: Diagnosis not present

## 2019-01-17 DIAGNOSIS — Z992 Dependence on renal dialysis: Secondary | ICD-10-CM | POA: Diagnosis not present

## 2019-01-17 DIAGNOSIS — N186 End stage renal disease: Secondary | ICD-10-CM | POA: Diagnosis not present

## 2019-01-17 DIAGNOSIS — D631 Anemia in chronic kidney disease: Secondary | ICD-10-CM | POA: Diagnosis not present

## 2019-01-17 DIAGNOSIS — D509 Iron deficiency anemia, unspecified: Secondary | ICD-10-CM | POA: Diagnosis not present

## 2019-01-17 DIAGNOSIS — N250 Renal osteodystrophy: Secondary | ICD-10-CM | POA: Diagnosis not present

## 2019-01-20 DIAGNOSIS — N186 End stage renal disease: Secondary | ICD-10-CM | POA: Diagnosis not present

## 2019-01-20 DIAGNOSIS — Z794 Long term (current) use of insulin: Secondary | ICD-10-CM | POA: Diagnosis not present

## 2019-01-20 DIAGNOSIS — D631 Anemia in chronic kidney disease: Secondary | ICD-10-CM | POA: Diagnosis not present

## 2019-01-20 DIAGNOSIS — N250 Renal osteodystrophy: Secondary | ICD-10-CM | POA: Diagnosis not present

## 2019-01-20 DIAGNOSIS — D509 Iron deficiency anemia, unspecified: Secondary | ICD-10-CM | POA: Diagnosis not present

## 2019-01-20 DIAGNOSIS — E119 Type 2 diabetes mellitus without complications: Secondary | ICD-10-CM | POA: Diagnosis not present

## 2019-01-20 DIAGNOSIS — Z992 Dependence on renal dialysis: Secondary | ICD-10-CM | POA: Diagnosis not present

## 2019-01-20 DIAGNOSIS — I259 Chronic ischemic heart disease, unspecified: Secondary | ICD-10-CM | POA: Diagnosis not present

## 2019-01-23 ENCOUNTER — Telehealth: Payer: Self-pay | Admitting: *Deleted

## 2019-01-23 DIAGNOSIS — D509 Iron deficiency anemia, unspecified: Secondary | ICD-10-CM | POA: Diagnosis not present

## 2019-01-23 DIAGNOSIS — N186 End stage renal disease: Secondary | ICD-10-CM | POA: Diagnosis not present

## 2019-01-23 DIAGNOSIS — N250 Renal osteodystrophy: Secondary | ICD-10-CM | POA: Diagnosis not present

## 2019-01-23 DIAGNOSIS — D631 Anemia in chronic kidney disease: Secondary | ICD-10-CM | POA: Diagnosis not present

## 2019-01-23 DIAGNOSIS — Z992 Dependence on renal dialysis: Secondary | ICD-10-CM | POA: Diagnosis not present

## 2019-01-23 NOTE — Telephone Encounter (Signed)
Please  write and I will sign. Thanks, WS

## 2019-01-23 NOTE — Telephone Encounter (Signed)
Rx written for gauze and tape and sent to [facility].

## 2019-01-24 DIAGNOSIS — Z992 Dependence on renal dialysis: Secondary | ICD-10-CM | POA: Diagnosis not present

## 2019-01-24 DIAGNOSIS — N250 Renal osteodystrophy: Secondary | ICD-10-CM | POA: Diagnosis not present

## 2019-01-24 DIAGNOSIS — D509 Iron deficiency anemia, unspecified: Secondary | ICD-10-CM | POA: Diagnosis not present

## 2019-01-24 DIAGNOSIS — N186 End stage renal disease: Secondary | ICD-10-CM | POA: Diagnosis not present

## 2019-01-24 DIAGNOSIS — D631 Anemia in chronic kidney disease: Secondary | ICD-10-CM | POA: Diagnosis not present

## 2019-01-25 DIAGNOSIS — E1122 Type 2 diabetes mellitus with diabetic chronic kidney disease: Secondary | ICD-10-CM | POA: Diagnosis not present

## 2019-01-25 DIAGNOSIS — F17200 Nicotine dependence, unspecified, uncomplicated: Secondary | ICD-10-CM | POA: Diagnosis not present

## 2019-01-25 DIAGNOSIS — Z992 Dependence on renal dialysis: Secondary | ICD-10-CM | POA: Diagnosis not present

## 2019-01-25 DIAGNOSIS — N186 End stage renal disease: Secondary | ICD-10-CM | POA: Diagnosis not present

## 2019-01-25 DIAGNOSIS — Z79899 Other long term (current) drug therapy: Secondary | ICD-10-CM | POA: Diagnosis not present

## 2019-01-25 DIAGNOSIS — H1032 Unspecified acute conjunctivitis, left eye: Secondary | ICD-10-CM | POA: Diagnosis not present

## 2019-01-25 DIAGNOSIS — I120 Hypertensive chronic kidney disease with stage 5 chronic kidney disease or end stage renal disease: Secondary | ICD-10-CM | POA: Diagnosis not present

## 2019-01-26 ENCOUNTER — Telehealth: Payer: Self-pay | Admitting: Family Medicine

## 2019-01-26 NOTE — Telephone Encounter (Signed)
Appointment scheduled.

## 2019-01-27 DIAGNOSIS — D509 Iron deficiency anemia, unspecified: Secondary | ICD-10-CM | POA: Diagnosis not present

## 2019-01-27 DIAGNOSIS — N250 Renal osteodystrophy: Secondary | ICD-10-CM | POA: Diagnosis not present

## 2019-01-27 DIAGNOSIS — D631 Anemia in chronic kidney disease: Secondary | ICD-10-CM | POA: Diagnosis not present

## 2019-01-27 DIAGNOSIS — Z992 Dependence on renal dialysis: Secondary | ICD-10-CM | POA: Diagnosis not present

## 2019-01-27 DIAGNOSIS — N186 End stage renal disease: Secondary | ICD-10-CM | POA: Diagnosis not present

## 2019-01-28 ENCOUNTER — Other Ambulatory Visit: Payer: Self-pay

## 2019-01-28 ENCOUNTER — Ambulatory Visit (HOSPITAL_COMMUNITY)
Admission: RE | Admit: 2019-01-28 | Discharge: 2019-01-28 | Disposition: A | Discharge status: Home / Self Care | Payer: Medicare Other | Source: Ambulatory Visit | Attending: Urology | Admitting: Urology

## 2019-01-28 DIAGNOSIS — D3002 Benign neoplasm of left kidney: Secondary | ICD-10-CM | POA: Diagnosis not present

## 2019-01-29 DIAGNOSIS — N186 End stage renal disease: Secondary | ICD-10-CM | POA: Diagnosis not present

## 2019-01-29 DIAGNOSIS — Z992 Dependence on renal dialysis: Secondary | ICD-10-CM | POA: Diagnosis not present

## 2019-01-29 DIAGNOSIS — D631 Anemia in chronic kidney disease: Secondary | ICD-10-CM | POA: Diagnosis not present

## 2019-01-29 DIAGNOSIS — N250 Renal osteodystrophy: Secondary | ICD-10-CM | POA: Diagnosis not present

## 2019-01-30 ENCOUNTER — Other Ambulatory Visit: Payer: Self-pay

## 2019-01-31 DIAGNOSIS — D631 Anemia in chronic kidney disease: Secondary | ICD-10-CM | POA: Diagnosis not present

## 2019-01-31 DIAGNOSIS — N186 End stage renal disease: Secondary | ICD-10-CM | POA: Diagnosis not present

## 2019-01-31 DIAGNOSIS — Z992 Dependence on renal dialysis: Secondary | ICD-10-CM | POA: Diagnosis not present

## 2019-01-31 DIAGNOSIS — N250 Renal osteodystrophy: Secondary | ICD-10-CM | POA: Diagnosis not present

## 2019-02-02 ENCOUNTER — Ambulatory Visit (INDEPENDENT_AMBULATORY_CARE_PROVIDER_SITE_OTHER): Payer: Medicare Other | Admitting: Family Medicine

## 2019-02-02 ENCOUNTER — Other Ambulatory Visit: Payer: Self-pay

## 2019-02-02 ENCOUNTER — Encounter: Payer: Self-pay | Admitting: Family Medicine

## 2019-02-02 VITALS — BP 156/83 | HR 76 | Temp 97.4°F | Ht 63.0 in | Wt 180.8 lb

## 2019-02-02 DIAGNOSIS — K219 Gastro-esophageal reflux disease without esophagitis: Secondary | ICD-10-CM

## 2019-02-02 DIAGNOSIS — H1032 Unspecified acute conjunctivitis, left eye: Secondary | ICD-10-CM | POA: Diagnosis not present

## 2019-02-02 MED ORDER — POLYMYXIN B-TRIMETHOPRIM 10000-0.1 UNIT/ML-% OP SOLN: 1.0000 [drp] | OPHTHALMIC | 0 refills | Status: AC

## 2019-02-02 MED ORDER — PANTOPRAZOLE SODIUM 40 MG PO TBEC: DELAYED_RELEASE_TABLET | ORAL | 3 refills | Status: DC

## 2019-02-02 NOTE — Progress Notes (Signed)
BP (!) 156/83   Pulse 76   Temp (!) 97.4 F (36.3 C) (Oral)   Ht 5' 3"  (1.6 m)   Wt 180 lb 12.8 oz (82 kg)   LMP 05/05/2015   BMI 32.03 kg/m    Subjective:   Patient ID: Kara Knapp, female    DOB: 1970-10-11, 48 y.o.   MRN: [id number]  HPI: Kara Knapp is a 48 y.o. female presenting on 02/02/2019 for Conjunctivitis (left. Hospital follow up [facility] x 1 week ago)   HPI Left eye conjunctivitis Patient is coming in for ER follow-up for left eye conjunctivitis and swelling.  She was seen at [facility] rocking him 1 week ago and given Keflex oral for swelling and redness and pain in her left eye.  She finished the Keflex and the swelling and pain and redness has come down and she only has a mild redness in the white of her eye but otherwise it is gone.  She has a little bit of matting but that is back to where she normally is and nothing more.  She denies any vision changes but she is already legally blind.  Relevant past medical, surgical, family and social history reviewed and updated as indicated. Interim medical history since our last visit reviewed. Allergies and medications reviewed and updated.  Review of Systems  Constitutional: Negative for chills and fever.  HENT: Negative for congestion, ear discharge and ear pain.   Eyes: Positive for discharge, redness and itching. Negative for pain and visual disturbance.  Respiratory: Negative for chest tightness and shortness of breath.   Cardiovascular: Negative for chest pain and leg swelling.  Genitourinary: Negative for difficulty urinating and dysuria.  Musculoskeletal: Negative for back pain and gait problem.  Skin: Negative for rash.  Neurological: Negative for light-headedness and headaches.  Psychiatric/Behavioral: Negative for agitation and behavioral problems.  All other systems reviewed and are negative.   Per HPI unless specifically indicated above   Allergies as of 02/02/2019      Reactions   Bactrim  [sulfamethoxazole-trimethoprim] Nausea And Vomiting   Prednisone Other (See Comments)   "I was wide open and couldn't eat" per pt.       Medication List       Accurate as of February 02, 2019 11:10 AM. If you have any questions, ask your nurse or doctor.        STOP taking these medications   traMADol 50 MG tablet Commonly known as: ULTRAM Stopped by: Fransisca Kaufmann Dettinger, MD     TAKE these medications   aspirin 81 MG tablet Take 81 mg by mouth daily.   azelastine 0.1 % nasal spray Commonly known as: ASTELIN Place 1 spray into both nostrils 2 (two) times daily. Use in each nostril as directed   cholecalciferol 1000 units tablet Commonly known as: VITAMIN D Take 1,000 Units by mouth daily.   Combigan 0.2-0.5 % ophthalmic solution Generic drug: brimonidine-timolol Place 1 drop into both eyes daily.   diclofenac sodium 1 % Gel Commonly known as: VOLTAREN Apply 2 g topically 4 (four) times daily.   FreeStyle Libre 14 Day Sensor Misc Inject 1 each into the skin every 14 (fourteen) days. Use as directed.   gabapentin 100 MG capsule Commonly known as: NEURONTIN TAKE ONE CAPSULE BY MOUTH THREE TIMES DAILY.   glucose blood test strip Commonly known as: Prodigy No Coding Blood Gluc USE TO CHECK BLOOD SUGAR TWICE DAILY.   Insulin Glargine 100 UNIT/ML Solostar Pen Commonly  known as: Lantus SoloStar Inject 18-50 Units into the skin at bedtime. What changed: how much to take   lidocaine-prilocaine cream Commonly known as: EMLA Apply 1 application topically daily as needed (arm port).   linagliptin 5 MG Tabs tablet Commonly known as: Tradjenta Take 1 tablet (5 mg total) by mouth daily.   pantoprazole 40 MG tablet Commonly known as: Protonix TAKE 1 BY MOUTH DAILY 30 minutes before breakfast   Pen Needles 31G X 5 MM Misc 1 Device by Does not apply route daily.   Prodigy Twist Top Lancets 28G Misc USE TO CHECK BLOOD SUGAR UP TO FOUR TIMES DAILY.   sertraline 100 MG  tablet Commonly known as: ZOLOFT Take 1.5 tablets (150 mg total) by mouth daily.   sevelamer carbonate 800 MG tablet Commonly known as: RENVELA Take 2,400 mg by mouth See admin instructions. Take 2460m by mouth three times daily with means and 8090mwith snacks   traZODone 50 MG tablet Commonly known as: DESYREL TAKE 1/2 TO 1 TABLET BY MOUTH AT BEDTIME AS NEEDED FOR SLEEP.        Objective:   BP (!) 156/83   Pulse 76   Temp (!) 97.4 F (36.3 C) (Oral)   Ht 5' 3"  (1.6 m)   Wt 180 lb 12.8 oz (82 kg)   LMP 05/05/2015   BMI 32.03 kg/m   Wt Readings from Last 3 Encounters:  02/02/19 180 lb 12.8 oz (82 kg)  12/26/18 173 lb (78.5 kg)  10/06/18 174 lb (78.9 kg)    Physical Exam Vitals signs and nursing note reviewed.  Constitutional:      General: She is not in acute distress.    Appearance: She is well-developed. She is not diaphoretic.  Eyes:     Conjunctiva/sclera:     Right eye: Right conjunctiva is not injected. No exudate or hemorrhage.    Left eye: Left conjunctiva is injected. No exudate or hemorrhage. Musculoskeletal:        General: No tenderness.  Skin:    General: Skin is warm and dry.     Findings: No rash.  Neurological:     Mental Status: She is alert and oriented to person, place, and time.     Coordination: Coordination normal.  Psychiatric:        Behavior: Behavior normal.     Results for orders placed or performed during the hospital encounter of 12/26/18  Glucose, capillary  Result Value Ref Range   Glucose-Capillary 140 (H) 70 - 99 mg/dL   Comment 1 Notify RN    Comment 2 Document in Chart   Basic metabolic panel  Result Value Ref Range   Sodium 137 135 - 145 mmol/L   Potassium 4.8 3.5 - 5.1 mmol/L   Chloride 99 98 - 111 mmol/L   CO2 24 22 - 32 mmol/L   Glucose, Bld 142 (H) 70 - 99 mg/dL   BUN 53 (H) 6 - 20 mg/dL   Creatinine, Ser 7.36 (H) 0.44 - 1.00 mg/dL   Calcium 9.2 8.9 - 10.3 mg/dL   GFR calc non Af Amer 6 (L) >60 mL/min    GFR calc Af Amer 7 (L) >60 mL/min   Anion gap 14 5 - 15  Glucose, capillary  Result Value Ref Range   Glucose-Capillary 110 (H) 70 - 99 mg/dL    Assessment & Plan:   Problem List Items Addressed This Visit    None    Visit Diagnoses    Acute  conjunctivitis of left eye, unspecified acute conjunctivitis type    -  Primary   Relevant Medications   trimethoprim-polymyxin b (POLYTRIM) ophthalmic solution   Gastroesophageal reflux disease, esophagitis presence not specified       Relevant Medications   pantoprazole (PROTONIX) 40 MG tablet      Finished Keflex, will give topical antibiotic drops if worsens she will let us know. Follow up plan: Return in about 4 weeks (around 03/02/2019), or if symptoms worsen or fail to improve, for Diabetes recheck.  Counseling provided for all of the vaccine components No orders of the defined types were placed in this encounter.   Caryl Pina, MD [facility] 02/02/2019, 11:10 AM
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
## 2019-02-03 ENCOUNTER — Other Ambulatory Visit: Payer: Self-pay | Admitting: *Deleted

## 2019-02-03 DIAGNOSIS — Z992 Dependence on renal dialysis: Secondary | ICD-10-CM | POA: Diagnosis not present

## 2019-02-03 DIAGNOSIS — N186 End stage renal disease: Secondary | ICD-10-CM | POA: Diagnosis not present

## 2019-02-03 DIAGNOSIS — D631 Anemia in chronic kidney disease: Secondary | ICD-10-CM | POA: Diagnosis not present

## 2019-02-03 DIAGNOSIS — N250 Renal osteodystrophy: Secondary | ICD-10-CM | POA: Diagnosis not present

## 2019-02-03 MED ORDER — PRODIGY NO CODING BLOOD GLUC VI STRP: ORAL_STRIP | 3 refills | Status: AC

## 2019-02-05 DIAGNOSIS — Z992 Dependence on renal dialysis: Secondary | ICD-10-CM | POA: Diagnosis not present

## 2019-02-05 DIAGNOSIS — D631 Anemia in chronic kidney disease: Secondary | ICD-10-CM | POA: Diagnosis not present

## 2019-02-05 DIAGNOSIS — N250 Renal osteodystrophy: Secondary | ICD-10-CM | POA: Diagnosis not present

## 2019-02-05 DIAGNOSIS — N186 End stage renal disease: Secondary | ICD-10-CM | POA: Diagnosis not present

## 2019-02-09 ENCOUNTER — Ambulatory Visit (INDEPENDENT_AMBULATORY_CARE_PROVIDER_SITE_OTHER): Payer: Medicare Other | Admitting: Psychiatry

## 2019-02-09 ENCOUNTER — Encounter (HOSPITAL_COMMUNITY): Payer: Self-pay | Admitting: Psychiatry

## 2019-02-09 ENCOUNTER — Other Ambulatory Visit: Payer: Self-pay

## 2019-02-09 DIAGNOSIS — F330 Major depressive disorder, recurrent, mild: Secondary | ICD-10-CM

## 2019-02-09 MED ORDER — SERTRALINE HCL 100 MG PO TABS: 150.0000 mg | ORAL_TABLET | Freq: Every day | ORAL | 0 refills | Status: DC

## 2019-02-09 NOTE — Patient Instructions (Addendum)
1.Continue sertraline 150 mg daily 2. Next appointment: 10/12 at 9:20

## 2019-02-10 ENCOUNTER — Other Ambulatory Visit: Payer: Self-pay | Admitting: Family Medicine

## 2019-02-10 DIAGNOSIS — N186 End stage renal disease: Secondary | ICD-10-CM | POA: Diagnosis not present

## 2019-02-10 DIAGNOSIS — D631 Anemia in chronic kidney disease: Secondary | ICD-10-CM | POA: Diagnosis not present

## 2019-02-10 DIAGNOSIS — Z992 Dependence on renal dialysis: Secondary | ICD-10-CM | POA: Diagnosis not present

## 2019-02-10 DIAGNOSIS — N250 Renal osteodystrophy: Secondary | ICD-10-CM | POA: Diagnosis not present

## 2019-02-11 NOTE — Telephone Encounter (Signed)
Needs to be seen

## 2019-02-12 ENCOUNTER — Telehealth (HOSPITAL_COMMUNITY): Payer: Self-pay | Admitting: Licensed Clinical Social Worker

## 2019-02-12 ENCOUNTER — Ambulatory Visit (HOSPITAL_COMMUNITY): Payer: Medicare Other | Admitting: Psychiatry

## 2019-02-12 ENCOUNTER — Other Ambulatory Visit: Payer: Self-pay

## 2019-02-12 ENCOUNTER — Ambulatory Visit (HOSPITAL_COMMUNITY): Payer: Medicare Other | Admitting: Licensed Clinical Social Worker

## 2019-02-12 DIAGNOSIS — N250 Renal osteodystrophy: Secondary | ICD-10-CM | POA: Diagnosis not present

## 2019-02-12 DIAGNOSIS — Z992 Dependence on renal dialysis: Secondary | ICD-10-CM | POA: Diagnosis not present

## 2019-02-12 DIAGNOSIS — N186 End stage renal disease: Secondary | ICD-10-CM | POA: Diagnosis not present

## 2019-02-12 DIAGNOSIS — D631 Anemia in chronic kidney disease: Secondary | ICD-10-CM | POA: Diagnosis not present

## 2019-02-12 NOTE — Telephone Encounter (Signed)
Patient did not respond to texts for a video session.

## 2019-02-14 DIAGNOSIS — D631 Anemia in chronic kidney disease: Secondary | ICD-10-CM | POA: Diagnosis not present

## 2019-02-14 DIAGNOSIS — N186 End stage renal disease: Secondary | ICD-10-CM | POA: Diagnosis not present

## 2019-02-14 DIAGNOSIS — N250 Renal osteodystrophy: Secondary | ICD-10-CM | POA: Diagnosis not present

## 2019-02-14 DIAGNOSIS — Z992 Dependence on renal dialysis: Secondary | ICD-10-CM | POA: Diagnosis not present

## 2019-02-16 DIAGNOSIS — H4051X3 Glaucoma secondary to other eye disorders, right eye, severe stage: Secondary | ICD-10-CM | POA: Diagnosis not present

## 2019-02-16 DIAGNOSIS — E113511 Type 2 diabetes mellitus with proliferative diabetic retinopathy with macular edema, right eye: Secondary | ICD-10-CM | POA: Diagnosis not present

## 2019-02-16 DIAGNOSIS — H211X1 Other vascular disorders of iris and ciliary body, right eye: Secondary | ICD-10-CM | POA: Diagnosis not present

## 2019-02-16 DIAGNOSIS — H4311 Vitreous hemorrhage, right eye: Secondary | ICD-10-CM | POA: Diagnosis not present

## 2019-02-17 DIAGNOSIS — N186 End stage renal disease: Secondary | ICD-10-CM | POA: Diagnosis not present

## 2019-02-17 DIAGNOSIS — D631 Anemia in chronic kidney disease: Secondary | ICD-10-CM | POA: Diagnosis not present

## 2019-02-17 DIAGNOSIS — N250 Renal osteodystrophy: Secondary | ICD-10-CM | POA: Diagnosis not present

## 2019-02-17 DIAGNOSIS — Z992 Dependence on renal dialysis: Secondary | ICD-10-CM | POA: Diagnosis not present

## 2019-02-20 DIAGNOSIS — Z992 Dependence on renal dialysis: Secondary | ICD-10-CM | POA: Diagnosis not present

## 2019-02-20 DIAGNOSIS — D631 Anemia in chronic kidney disease: Secondary | ICD-10-CM | POA: Diagnosis not present

## 2019-02-20 DIAGNOSIS — N186 End stage renal disease: Secondary | ICD-10-CM | POA: Diagnosis not present

## 2019-02-20 DIAGNOSIS — N250 Renal osteodystrophy: Secondary | ICD-10-CM | POA: Diagnosis not present

## 2019-02-21 DIAGNOSIS — N186 End stage renal disease: Secondary | ICD-10-CM | POA: Diagnosis not present

## 2019-02-21 DIAGNOSIS — N250 Renal osteodystrophy: Secondary | ICD-10-CM | POA: Diagnosis not present

## 2019-02-21 DIAGNOSIS — Z992 Dependence on renal dialysis: Secondary | ICD-10-CM | POA: Diagnosis not present

## 2019-02-21 DIAGNOSIS — D631 Anemia in chronic kidney disease: Secondary | ICD-10-CM | POA: Diagnosis not present

## 2019-02-24 DIAGNOSIS — N250 Renal osteodystrophy: Secondary | ICD-10-CM | POA: Diagnosis not present

## 2019-02-24 DIAGNOSIS — N186 End stage renal disease: Secondary | ICD-10-CM | POA: Diagnosis not present

## 2019-02-24 DIAGNOSIS — D631 Anemia in chronic kidney disease: Secondary | ICD-10-CM | POA: Diagnosis not present

## 2019-02-24 DIAGNOSIS — Z992 Dependence on renal dialysis: Secondary | ICD-10-CM | POA: Diagnosis not present

## 2019-02-26 DIAGNOSIS — Z992 Dependence on renal dialysis: Secondary | ICD-10-CM | POA: Diagnosis not present

## 2019-02-26 DIAGNOSIS — N250 Renal osteodystrophy: Secondary | ICD-10-CM | POA: Diagnosis not present

## 2019-02-26 DIAGNOSIS — N186 End stage renal disease: Secondary | ICD-10-CM | POA: Diagnosis not present

## 2019-02-26 DIAGNOSIS — D631 Anemia in chronic kidney disease: Secondary | ICD-10-CM | POA: Diagnosis not present

## 2019-02-27 DIAGNOSIS — N186 End stage renal disease: Secondary | ICD-10-CM | POA: Diagnosis not present

## 2019-02-27 DIAGNOSIS — Z992 Dependence on renal dialysis: Secondary | ICD-10-CM | POA: Diagnosis not present

## 2019-02-28 DIAGNOSIS — Z992 Dependence on renal dialysis: Secondary | ICD-10-CM | POA: Diagnosis not present

## 2019-02-28 DIAGNOSIS — N250 Renal osteodystrophy: Secondary | ICD-10-CM | POA: Diagnosis not present

## 2019-02-28 DIAGNOSIS — D631 Anemia in chronic kidney disease: Secondary | ICD-10-CM | POA: Diagnosis not present

## 2019-02-28 DIAGNOSIS — N186 End stage renal disease: Secondary | ICD-10-CM | POA: Diagnosis not present

## 2019-03-02 ENCOUNTER — Ambulatory Visit (INDEPENDENT_AMBULATORY_CARE_PROVIDER_SITE_OTHER): Payer: Medicaid Other | Admitting: Licensed Clinical Social Worker

## 2019-03-02 ENCOUNTER — Other Ambulatory Visit: Payer: Self-pay

## 2019-03-02 ENCOUNTER — Encounter (HOSPITAL_COMMUNITY): Payer: Self-pay | Admitting: Licensed Clinical Social Worker

## 2019-03-02 DIAGNOSIS — F330 Major depressive disorder, recurrent, mild: Secondary | ICD-10-CM | POA: Diagnosis not present

## 2019-03-02 NOTE — Progress Notes (Signed)
Virtual Visit via Video Note  I connected with Kara Knapp on 03/02/19 at  9:00 AM EDT by a video enabled telemedicine application and verified that I am speaking with the correct person using two identifiers.  Location: Patient: Home Provider: Office   I discussed the limitations of evaluation and management by telemedicine and the availability of in person appointments. The patient expressed understanding and agreed to proceed.  Participation Level: Active  Behavioral Response: CasualAlertDepressed  Type of Therapy: Individual Therapy  Treatment Goals addressed: Coping  Interventions: CBT and Solution Focused  Summary: Kara Knapp is a 48 y.o. female who presents oriented x5 (person, place, situation, time, and object), casually dressed, appropriately groomed, average height, overweight, and cooperative to address mood. Patient has a history of medical treatment including dialysis. Patient has a minimal history of mental health treatment including medication management. Patient denies suicidal and homicidal ideations. Patient denies psychosis including auditory and visual hallucinations. Patient denies substance abuse. Patient is at low risk for lethality at this time.  Physically: Patient has been tired. Her legs have been sore partly due to bumping into furniture due to her loss of vision. Patient's appetite has been better and her sleep in "good." She feels like her kidney's cause her not to be able to do as much.  Spiritually/values: Patient is spiritually healthy.  Relationships: Patient loves her family but also recognizes that her family has problems. She feels like her family is divided. Her sister has recently come back into her life but doesn't like her mother. Patient can understand why her sister doesn't like her mother and is careful with what she says about her mother or sister. Patient also recognized that she crossed boundaries with her last health aid due to  becoming too "friendly" and "personal." Patient knew a lot about her health aid's personal life and the aid would discuss a lot of problems with patient. Patient got tired of all the negativity but wouldn't speak up for fear of being rude.   Emotionally/Mentally/Behavior: Patient's mood has been good but she has had some moments of irritability. Patient is working on getting along with her family. She has learned that she can't solve other's problems and has tried keep healthy boundaries.   Patient engaged in session. She responded well to interventions. Patient continues to meet criteria for Major depressive disorder, recurrent episode, mild. Patient will continue in outpatient therapy due to being the least restrictive service to meet her needs. Patient made minimal progress on her goals.   Suicidal/Homicidal: Negativewithout intent/plan  Therapist Response: Therapist reviewed patient's recent thoughts and behaviors. Therapist utilized CBT to address mood. Therapist processed patient's recent thoughts to identify triggers for mood. Therapist discussed with patient her family relationships.    Plan: Return again in 2-3 weeks.  Diagnosis: Axis I: Major depressive disorder, recurrent episode, mild    Axis II: No diagnosis  I discussed the assessment and treatment plan with the patient. The patient was provided an opportunity to ask questions and all were answered. The patient agreed with the plan and demonstrated an understanding of the instructions.   The patient was advised to call back or seek an in-person evaluation if the symptoms worsen or if the condition fails to improve as anticipated.  I provided 40 minutes of non-face-to-face time during this encounter.   Glori Bickers, LCSW 03/02/2019

## 2019-03-03 DIAGNOSIS — N250 Renal osteodystrophy: Secondary | ICD-10-CM | POA: Diagnosis not present

## 2019-03-03 DIAGNOSIS — D631 Anemia in chronic kidney disease: Secondary | ICD-10-CM | POA: Diagnosis not present

## 2019-03-03 DIAGNOSIS — Z992 Dependence on renal dialysis: Secondary | ICD-10-CM | POA: Diagnosis not present

## 2019-03-03 DIAGNOSIS — N186 End stage renal disease: Secondary | ICD-10-CM | POA: Diagnosis not present

## 2019-03-05 DIAGNOSIS — Z992 Dependence on renal dialysis: Secondary | ICD-10-CM | POA: Diagnosis not present

## 2019-03-05 DIAGNOSIS — N186 End stage renal disease: Secondary | ICD-10-CM | POA: Diagnosis not present

## 2019-03-05 DIAGNOSIS — D631 Anemia in chronic kidney disease: Secondary | ICD-10-CM | POA: Diagnosis not present

## 2019-03-05 DIAGNOSIS — N250 Renal osteodystrophy: Secondary | ICD-10-CM | POA: Diagnosis not present

## 2019-03-07 DIAGNOSIS — D631 Anemia in chronic kidney disease: Secondary | ICD-10-CM | POA: Diagnosis not present

## 2019-03-07 DIAGNOSIS — N250 Renal osteodystrophy: Secondary | ICD-10-CM | POA: Diagnosis not present

## 2019-03-07 DIAGNOSIS — N186 End stage renal disease: Secondary | ICD-10-CM | POA: Diagnosis not present

## 2019-03-07 DIAGNOSIS — Z992 Dependence on renal dialysis: Secondary | ICD-10-CM | POA: Diagnosis not present

## 2019-03-09 ENCOUNTER — Other Ambulatory Visit: Payer: Self-pay | Admitting: Family Medicine

## 2019-03-10 DIAGNOSIS — D631 Anemia in chronic kidney disease: Secondary | ICD-10-CM | POA: Diagnosis not present

## 2019-03-10 DIAGNOSIS — N186 End stage renal disease: Secondary | ICD-10-CM | POA: Diagnosis not present

## 2019-03-10 DIAGNOSIS — Z992 Dependence on renal dialysis: Secondary | ICD-10-CM | POA: Diagnosis not present

## 2019-03-10 DIAGNOSIS — N250 Renal osteodystrophy: Secondary | ICD-10-CM | POA: Diagnosis not present

## 2019-03-11 ENCOUNTER — Telehealth: Payer: Self-pay

## 2019-03-11 ENCOUNTER — Ambulatory Visit: Payer: Medicare Other | Admitting: Urology

## 2019-03-11 ENCOUNTER — Other Ambulatory Visit: Payer: Self-pay | Admitting: Family Medicine

## 2019-03-11 NOTE — Telephone Encounter (Signed)
Noted, no further recommendations.

## 2019-03-11 NOTE — Telephone Encounter (Signed)
T/C from [facility] at [facility], pt was there to do labs.  She was last here in March 2020 and seen Walden Field, NP who told her to do her labs with hematology.  We had not placed any lab orders for her.  She has appointment with Randall Hiss on 04/08/2019 at 11:00 am and needs to keep that appointment.  I was able to speak with the pt and she is aware and was OK.   Sending FYI to Walden Field, NP.

## 2019-03-12 DIAGNOSIS — N186 End stage renal disease: Secondary | ICD-10-CM | POA: Diagnosis not present

## 2019-03-12 DIAGNOSIS — Z992 Dependence on renal dialysis: Secondary | ICD-10-CM | POA: Diagnosis not present

## 2019-03-12 DIAGNOSIS — D631 Anemia in chronic kidney disease: Secondary | ICD-10-CM | POA: Diagnosis not present

## 2019-03-12 DIAGNOSIS — N250 Renal osteodystrophy: Secondary | ICD-10-CM | POA: Diagnosis not present

## 2019-03-14 DIAGNOSIS — N186 End stage renal disease: Secondary | ICD-10-CM | POA: Diagnosis not present

## 2019-03-14 DIAGNOSIS — D631 Anemia in chronic kidney disease: Secondary | ICD-10-CM | POA: Diagnosis not present

## 2019-03-14 DIAGNOSIS — Z992 Dependence on renal dialysis: Secondary | ICD-10-CM | POA: Diagnosis not present

## 2019-03-14 DIAGNOSIS — N250 Renal osteodystrophy: Secondary | ICD-10-CM | POA: Diagnosis not present

## 2019-03-16 DIAGNOSIS — Z79899 Other long term (current) drug therapy: Secondary | ICD-10-CM | POA: Diagnosis not present

## 2019-03-16 DIAGNOSIS — I120 Hypertensive chronic kidney disease with stage 5 chronic kidney disease or end stage renal disease: Secondary | ICD-10-CM | POA: Diagnosis not present

## 2019-03-16 DIAGNOSIS — I10 Essential (primary) hypertension: Secondary | ICD-10-CM | POA: Diagnosis not present

## 2019-03-16 DIAGNOSIS — N186 End stage renal disease: Secondary | ICD-10-CM | POA: Diagnosis not present

## 2019-03-16 DIAGNOSIS — E1122 Type 2 diabetes mellitus with diabetic chronic kidney disease: Secondary | ICD-10-CM | POA: Diagnosis not present

## 2019-03-16 DIAGNOSIS — Z992 Dependence on renal dialysis: Secondary | ICD-10-CM | POA: Diagnosis not present

## 2019-03-16 DIAGNOSIS — Z882 Allergy status to sulfonamides status: Secondary | ICD-10-CM | POA: Diagnosis not present

## 2019-03-16 DIAGNOSIS — S93602A Unspecified sprain of left foot, initial encounter: Secondary | ICD-10-CM | POA: Diagnosis not present

## 2019-03-16 DIAGNOSIS — Z888 Allergy status to other drugs, medicaments and biological substances status: Secondary | ICD-10-CM | POA: Diagnosis not present

## 2019-03-16 DIAGNOSIS — Z8614 Personal history of Methicillin resistant Staphylococcus aureus infection: Secondary | ICD-10-CM | POA: Diagnosis not present

## 2019-03-16 DIAGNOSIS — S99922A Unspecified injury of left foot, initial encounter: Secondary | ICD-10-CM | POA: Diagnosis not present

## 2019-03-16 DIAGNOSIS — Z794 Long term (current) use of insulin: Secondary | ICD-10-CM | POA: Diagnosis not present

## 2019-03-16 DIAGNOSIS — E1140 Type 2 diabetes mellitus with diabetic neuropathy, unspecified: Secondary | ICD-10-CM | POA: Diagnosis not present

## 2019-03-16 DIAGNOSIS — W458XXA Other foreign body or object entering through skin, initial encounter: Secondary | ICD-10-CM | POA: Diagnosis not present

## 2019-03-16 DIAGNOSIS — L02811 Cutaneous abscess of head [any part, except face]: Secondary | ICD-10-CM | POA: Diagnosis not present

## 2019-03-16 DIAGNOSIS — F17200 Nicotine dependence, unspecified, uncomplicated: Secondary | ICD-10-CM | POA: Diagnosis not present

## 2019-03-16 DIAGNOSIS — M79672 Pain in left foot: Secondary | ICD-10-CM | POA: Diagnosis not present

## 2019-03-17 DIAGNOSIS — N186 End stage renal disease: Secondary | ICD-10-CM | POA: Diagnosis not present

## 2019-03-17 DIAGNOSIS — D631 Anemia in chronic kidney disease: Secondary | ICD-10-CM | POA: Diagnosis not present

## 2019-03-17 DIAGNOSIS — Z992 Dependence on renal dialysis: Secondary | ICD-10-CM | POA: Diagnosis not present

## 2019-03-17 DIAGNOSIS — N250 Renal osteodystrophy: Secondary | ICD-10-CM | POA: Diagnosis not present

## 2019-03-18 ENCOUNTER — Encounter: Payer: Self-pay | Admitting: Family Medicine

## 2019-03-18 ENCOUNTER — Telehealth (INDEPENDENT_AMBULATORY_CARE_PROVIDER_SITE_OTHER): Payer: Medicare Other | Admitting: Family Medicine

## 2019-03-18 DIAGNOSIS — H548 Legal blindness, as defined in USA: Secondary | ICD-10-CM

## 2019-03-18 DIAGNOSIS — E1142 Type 2 diabetes mellitus with diabetic polyneuropathy: Secondary | ICD-10-CM | POA: Diagnosis not present

## 2019-03-18 DIAGNOSIS — N186 End stage renal disease: Secondary | ICD-10-CM | POA: Diagnosis not present

## 2019-03-18 DIAGNOSIS — E1122 Type 2 diabetes mellitus with diabetic chronic kidney disease: Secondary | ICD-10-CM | POA: Diagnosis not present

## 2019-03-18 DIAGNOSIS — F331 Major depressive disorder, recurrent, moderate: Secondary | ICD-10-CM | POA: Diagnosis not present

## 2019-03-18 DIAGNOSIS — L0291 Cutaneous abscess, unspecified: Secondary | ICD-10-CM | POA: Diagnosis not present

## 2019-03-18 MED ORDER — GABAPENTIN 100 MG PO CAPS: 100.0000 mg | ORAL_CAPSULE | Freq: Three times a day (TID) | ORAL | 3 refills | Status: DC

## 2019-03-18 MED ORDER — LINAGLIPTIN 5 MG PO TABS: 5.0000 mg | ORAL_TABLET | Freq: Every day | ORAL | 3 refills | Status: AC

## 2019-03-18 MED ORDER — TRAMADOL HCL 50 MG PO TABS: 50.0000 mg | ORAL_TABLET | Freq: Three times a day (TID) | ORAL | 0 refills | Status: DC | PRN

## 2019-03-18 MED ORDER — TRAZODONE HCL 50 MG PO TABS: 25.0000 mg | ORAL_TABLET | Freq: Every evening | ORAL | 3 refills | Status: DC | PRN

## 2019-03-18 NOTE — Progress Notes (Signed)
Virtual Visit via video note  I connected with Kara Knapp on 03/18/19 at 1050 by video and verified that I am speaking with the correct person using two identifiers. Kara Knapp is currently located at home and aid/ daughter and priscilla are currently with her during visit. The provider, Fransisca Kaufmann Galileo Colello, MD is located in their office at time of visit.  Call ended at 1121  I discussed the limitations, risks, security and privacy concerns of performing an evaluation and management service by video and the availability of in person appointments. I also discussed with the patient that there may be a patient responsible charge related to this service. The patient expressed understanding and agreed to proceed.   History and Present Illness: Type 2 diabetes mellitus Patient comes in today for recheck of his diabetes. Patient has been currently taking lantus 18-20 and tradjenta. Patient is not currently on an ACE inhibitor/ARB. Patient has not seen an ophthalmologist this year. Patient has neuropathy and also is endstage on dialysis   Anxiety Sees dr hisada for anxiety and depression.  She is currently on trazodone and zoloft. And she follow with them  She was in the emergency department yesterday and was given clindamycin and tramadol. She has an abscess on her left temple and is taking antibiotic and just started yesterday.  She denies any fevers or chills.  She feels like it is not draining and she has headache on that side of her face.   She complains of low back pain.  When she stands in one place.  She will try to strengthen her core muscles and causes her back to hurt.   No diagnosis found.  Outpatient Encounter Medications as of 03/18/2019  Medication Sig  . aspirin 81 MG tablet Take 81 mg by mouth daily.  Marland Kitchen azelastine (ASTELIN) 0.1 % nasal spray Place 1 spray into both nostrils 2 (two) times daily. Use in each nostril as directed  . brimonidine-timolol (COMBIGAN) 0.2-0.5  % ophthalmic solution Place 1 drop into both eyes daily.   . cholecalciferol (VITAMIN D) 1000 units tablet Take 1,000 Units by mouth daily.   . Continuous Blood Gluc Sensor (FREESTYLE LIBRE 14 DAY SENSOR) MISC Inject 1 each into the skin every 14 (fourteen) days. Use as directed.  . diclofenac sodium (VOLTAREN) 1 % GEL Apply 2 g topically 4 (four) times daily.  Marland Kitchen gabapentin (NEURONTIN) 100 MG capsule TAKE ONE CAPSULE BY MOUTH THREE TIMES DAILY.  Marland Kitchen glucose blood (PRODIGY NO CODING BLOOD GLUC) test strip USE TO CHECK BLOOD SUGAR TWICE DAILY. Dx E11.22  . Insulin Glargine (LANTUS SOLOSTAR) 100 UNIT/ML Solostar Pen Inject 18-50 Units into the skin at bedtime. (Patient taking differently: Inject 18 Units into the skin at bedtime. )  . Insulin Pen Needle (TRUEPLUS PEN NEEDLES) 31G X 5 MM MISC Use daily with insulin Dx E11.22  . lidocaine-prilocaine (EMLA) cream Apply 1 application topically daily as needed (arm port).  Marland Kitchen linagliptin (TRADJENTA) 5 MG TABS tablet Take 1 tablet (5 mg total) by mouth daily.  . pantoprazole (PROTONIX) 40 MG tablet TAKE 1 BY MOUTH DAILY 30 minutes before breakfast  . PRODIGY TWIST TOP LANCETS 28G MISC USE TO CHECK BLOOD SUGAR UP TO FOUR TIMES DAILY.  Marland Kitchen sertraline (ZOLOFT) 100 MG tablet Take 1.5 tablets (150 mg total) by mouth daily.  . sevelamer carbonate (RENVELA) 800 MG tablet Take 2,400 mg by mouth See admin instructions. Take 2435m by mouth three times daily with means and 8078m  with snacks  . traZODone (DESYREL) 50 MG tablet TAKE 1/2 TO 1 TABLET BY MOUTH AT BEDTIME AS NEEDED FOR SLEEP.   No facility-administered encounter medications on file as of 03/18/2019.     Review of Systems  Constitutional: Negative for chills and fever.  HENT: Negative for congestion, ear discharge and ear pain.   Eyes: Negative for redness and visual disturbance.  Respiratory: Negative for chest tightness and shortness of breath.   Cardiovascular: Negative for chest pain and leg swelling.   Genitourinary: Negative for difficulty urinating and dysuria.  Musculoskeletal: Negative for back pain and gait problem.  Skin: Positive for color change (Cellulitis and abscess that looks to be about 2-1/2 cm in size on her left temple that is scabbed over and does not appear draining). Negative for rash.  Neurological: Negative for light-headedness and headaches.  Psychiatric/Behavioral: Negative for agitation and behavioral problems.  All other systems reviewed and are negative.   Observations/Objective: Patient sounds comfortable and looks comfortable, the abscess is seen on the side of her head and if there is some inflammation there but otherwise patient is acting normally  Assessment and Plan: Problem List Items Addressed This Visit      Endocrine   Type 2 diabetes mellitus with ESRD (end-stage renal disease) ([place])   Relevant Medications   linagliptin (TRADJENTA) 5 MG TABS tablet     Nervous and Auditory   Diabetic neuropathy (HCC)   Relevant Medications   linagliptin (TRADJENTA) 5 MG TABS tablet     Other   Legally blind   MDD (major depressive disorder), recurrent episode, moderate (HCC)   Relevant Medications   traZODone (DESYREL) 50 MG tablet    Other Visit Diagnoses    Abscess    -  Primary   Relevant Medications   traMADol (ULTRAM) 50 MG tablet       Follow Up Instructions:  She will do exercises for her back  She has increased appetite and will try to get more protein to stick with her longer  We will give her the tramadol for her abscess and have her follow-up in 2 weeks for this.   I discussed the assessment and treatment plan with the patient. The patient was provided an opportunity to ask questions and all were answered. The patient agreed with the plan and demonstrated an understanding of the instructions.   The patient was advised to call back or seek an in-person evaluation if the symptoms worsen or if the condition fails to improve as anticipated.   The above assessment and management plan was discussed with the patient. The patient verbalized understanding of and has agreed to the management plan. Patient is aware to call the clinic if symptoms persist or worsen. Patient is aware when to return to the clinic for a follow-up visit. Patient educated on when it is appropriate to go to the emergency department.    I provided 31 minutes of non-face-to-face time during this encounter.    Worthy Rancher, MD

## 2019-03-18 NOTE — Progress Notes (Signed)
Fixed the schedule

## 2019-03-19 ENCOUNTER — Ambulatory Visit: Payer: Medicare Other | Admitting: Family Medicine

## 2019-03-20 DIAGNOSIS — N186 End stage renal disease: Secondary | ICD-10-CM | POA: Diagnosis not present

## 2019-03-20 DIAGNOSIS — Z992 Dependence on renal dialysis: Secondary | ICD-10-CM | POA: Diagnosis not present

## 2019-03-20 DIAGNOSIS — D631 Anemia in chronic kidney disease: Secondary | ICD-10-CM | POA: Diagnosis not present

## 2019-03-20 DIAGNOSIS — N250 Renal osteodystrophy: Secondary | ICD-10-CM | POA: Diagnosis not present

## 2019-03-21 DIAGNOSIS — Z992 Dependence on renal dialysis: Secondary | ICD-10-CM | POA: Diagnosis not present

## 2019-03-21 DIAGNOSIS — D631 Anemia in chronic kidney disease: Secondary | ICD-10-CM | POA: Diagnosis not present

## 2019-03-21 DIAGNOSIS — N250 Renal osteodystrophy: Secondary | ICD-10-CM | POA: Diagnosis not present

## 2019-03-21 DIAGNOSIS — N186 End stage renal disease: Secondary | ICD-10-CM | POA: Diagnosis not present

## 2019-03-24 DIAGNOSIS — D631 Anemia in chronic kidney disease: Secondary | ICD-10-CM | POA: Diagnosis not present

## 2019-03-24 DIAGNOSIS — Z992 Dependence on renal dialysis: Secondary | ICD-10-CM | POA: Diagnosis not present

## 2019-03-24 DIAGNOSIS — N186 End stage renal disease: Secondary | ICD-10-CM | POA: Diagnosis not present

## 2019-03-24 DIAGNOSIS — N250 Renal osteodystrophy: Secondary | ICD-10-CM | POA: Diagnosis not present

## 2019-03-26 DIAGNOSIS — N186 End stage renal disease: Secondary | ICD-10-CM | POA: Diagnosis not present

## 2019-03-26 DIAGNOSIS — D631 Anemia in chronic kidney disease: Secondary | ICD-10-CM | POA: Diagnosis not present

## 2019-03-26 DIAGNOSIS — Z992 Dependence on renal dialysis: Secondary | ICD-10-CM | POA: Diagnosis not present

## 2019-03-26 DIAGNOSIS — N250 Renal osteodystrophy: Secondary | ICD-10-CM | POA: Diagnosis not present

## 2019-03-27 ENCOUNTER — Ambulatory Visit: Payer: Medicare Other | Admitting: Podiatry

## 2019-03-27 DIAGNOSIS — Z8614 Personal history of Methicillin resistant Staphylococcus aureus infection: Secondary | ICD-10-CM | POA: Diagnosis not present

## 2019-03-27 DIAGNOSIS — H00014 Hordeolum externum left upper eyelid: Secondary | ICD-10-CM | POA: Diagnosis not present

## 2019-03-27 DIAGNOSIS — Z79899 Other long term (current) drug therapy: Secondary | ICD-10-CM | POA: Diagnosis not present

## 2019-03-27 DIAGNOSIS — H579 Unspecified disorder of eye and adnexa: Secondary | ICD-10-CM | POA: Diagnosis not present

## 2019-03-27 DIAGNOSIS — E1140 Type 2 diabetes mellitus with diabetic neuropathy, unspecified: Secondary | ICD-10-CM | POA: Diagnosis not present

## 2019-03-27 DIAGNOSIS — I10 Essential (primary) hypertension: Secondary | ICD-10-CM | POA: Diagnosis not present

## 2019-03-27 DIAGNOSIS — Z882 Allergy status to sulfonamides status: Secondary | ICD-10-CM | POA: Diagnosis not present

## 2019-03-27 DIAGNOSIS — R5381 Other malaise: Secondary | ICD-10-CM | POA: Diagnosis not present

## 2019-03-27 DIAGNOSIS — M25572 Pain in left ankle and joints of left foot: Secondary | ICD-10-CM | POA: Diagnosis not present

## 2019-03-27 DIAGNOSIS — R52 Pain, unspecified: Secondary | ICD-10-CM | POA: Diagnosis not present

## 2019-03-27 DIAGNOSIS — I120 Hypertensive chronic kidney disease with stage 5 chronic kidney disease or end stage renal disease: Secondary | ICD-10-CM | POA: Diagnosis not present

## 2019-03-27 DIAGNOSIS — Z888 Allergy status to other drugs, medicaments and biological substances status: Secondary | ICD-10-CM | POA: Diagnosis not present

## 2019-03-27 DIAGNOSIS — Z992 Dependence on renal dialysis: Secondary | ICD-10-CM | POA: Diagnosis not present

## 2019-03-27 DIAGNOSIS — X501XXA Overexertion from prolonged static or awkward postures, initial encounter: Secondary | ICD-10-CM | POA: Diagnosis not present

## 2019-03-27 DIAGNOSIS — S93402A Sprain of unspecified ligament of left ankle, initial encounter: Secondary | ICD-10-CM | POA: Diagnosis not present

## 2019-03-27 DIAGNOSIS — N186 End stage renal disease: Secondary | ICD-10-CM | POA: Diagnosis not present

## 2019-03-27 DIAGNOSIS — Z794 Long term (current) use of insulin: Secondary | ICD-10-CM | POA: Diagnosis not present

## 2019-03-27 DIAGNOSIS — F17200 Nicotine dependence, unspecified, uncomplicated: Secondary | ICD-10-CM | POA: Diagnosis not present

## 2019-03-27 DIAGNOSIS — E1122 Type 2 diabetes mellitus with diabetic chronic kidney disease: Secondary | ICD-10-CM | POA: Diagnosis not present

## 2019-03-28 DIAGNOSIS — N186 End stage renal disease: Secondary | ICD-10-CM | POA: Diagnosis not present

## 2019-03-28 DIAGNOSIS — D631 Anemia in chronic kidney disease: Secondary | ICD-10-CM | POA: Diagnosis not present

## 2019-03-28 DIAGNOSIS — N250 Renal osteodystrophy: Secondary | ICD-10-CM | POA: Diagnosis not present

## 2019-03-28 DIAGNOSIS — Z992 Dependence on renal dialysis: Secondary | ICD-10-CM | POA: Diagnosis not present

## 2019-03-30 ENCOUNTER — Telehealth: Payer: Self-pay | Admitting: Family Medicine

## 2019-03-30 DIAGNOSIS — Z992 Dependence on renal dialysis: Secondary | ICD-10-CM | POA: Diagnosis not present

## 2019-03-30 DIAGNOSIS — N186 End stage renal disease: Secondary | ICD-10-CM | POA: Diagnosis not present

## 2019-03-30 DIAGNOSIS — M25572 Pain in left ankle and joints of left foot: Secondary | ICD-10-CM

## 2019-03-30 NOTE — Telephone Encounter (Signed)
Patient states she would like a referral to see Dr. Irving Shows due to her left ankle pain. Please advise

## 2019-03-31 ENCOUNTER — Other Ambulatory Visit: Payer: Self-pay

## 2019-03-31 DIAGNOSIS — N186 End stage renal disease: Secondary | ICD-10-CM | POA: Diagnosis not present

## 2019-03-31 DIAGNOSIS — N250 Renal osteodystrophy: Secondary | ICD-10-CM | POA: Diagnosis not present

## 2019-03-31 DIAGNOSIS — D631 Anemia in chronic kidney disease: Secondary | ICD-10-CM | POA: Diagnosis not present

## 2019-03-31 DIAGNOSIS — Z992 Dependence on renal dialysis: Secondary | ICD-10-CM | POA: Diagnosis not present

## 2019-03-31 DIAGNOSIS — D509 Iron deficiency anemia, unspecified: Secondary | ICD-10-CM | POA: Diagnosis not present

## 2019-04-01 ENCOUNTER — Ambulatory Visit: Payer: Medicare Other | Admitting: Family Medicine

## 2019-04-01 NOTE — Telephone Encounter (Signed)
Yes that is fine to go ahead and put a referral into podiatry, diagnosis ankle pain and foot pain

## 2019-04-01 NOTE — Telephone Encounter (Signed)
Referral placed per patients request

## 2019-04-02 ENCOUNTER — Ambulatory Visit: Payer: Self-pay | Admitting: Family Medicine

## 2019-04-02 DIAGNOSIS — Z992 Dependence on renal dialysis: Secondary | ICD-10-CM | POA: Diagnosis not present

## 2019-04-02 DIAGNOSIS — N186 End stage renal disease: Secondary | ICD-10-CM | POA: Diagnosis not present

## 2019-04-02 DIAGNOSIS — N250 Renal osteodystrophy: Secondary | ICD-10-CM | POA: Diagnosis not present

## 2019-04-02 DIAGNOSIS — D509 Iron deficiency anemia, unspecified: Secondary | ICD-10-CM | POA: Diagnosis not present

## 2019-04-02 DIAGNOSIS — D631 Anemia in chronic kidney disease: Secondary | ICD-10-CM | POA: Diagnosis not present

## 2019-04-04 DIAGNOSIS — N250 Renal osteodystrophy: Secondary | ICD-10-CM | POA: Diagnosis not present

## 2019-04-04 DIAGNOSIS — D509 Iron deficiency anemia, unspecified: Secondary | ICD-10-CM | POA: Diagnosis not present

## 2019-04-04 DIAGNOSIS — Z992 Dependence on renal dialysis: Secondary | ICD-10-CM | POA: Diagnosis not present

## 2019-04-04 DIAGNOSIS — D631 Anemia in chronic kidney disease: Secondary | ICD-10-CM | POA: Diagnosis not present

## 2019-04-04 DIAGNOSIS — N186 End stage renal disease: Secondary | ICD-10-CM | POA: Diagnosis not present

## 2019-04-07 DIAGNOSIS — N250 Renal osteodystrophy: Secondary | ICD-10-CM | POA: Diagnosis not present

## 2019-04-07 DIAGNOSIS — Z992 Dependence on renal dialysis: Secondary | ICD-10-CM | POA: Diagnosis not present

## 2019-04-07 DIAGNOSIS — D509 Iron deficiency anemia, unspecified: Secondary | ICD-10-CM | POA: Diagnosis not present

## 2019-04-07 DIAGNOSIS — N186 End stage renal disease: Secondary | ICD-10-CM | POA: Diagnosis not present

## 2019-04-07 DIAGNOSIS — D631 Anemia in chronic kidney disease: Secondary | ICD-10-CM | POA: Diagnosis not present

## 2019-04-08 ENCOUNTER — Other Ambulatory Visit: Payer: Self-pay | Admitting: *Deleted

## 2019-04-08 ENCOUNTER — Ambulatory Visit: Payer: Medicare Other | Admitting: Nurse Practitioner

## 2019-04-08 ENCOUNTER — Ambulatory Visit (INDEPENDENT_AMBULATORY_CARE_PROVIDER_SITE_OTHER): Payer: Medicare Other | Admitting: Gastroenterology

## 2019-04-08 ENCOUNTER — Encounter: Payer: Self-pay | Admitting: *Deleted

## 2019-04-08 ENCOUNTER — Other Ambulatory Visit: Payer: Self-pay

## 2019-04-08 ENCOUNTER — Encounter: Payer: Self-pay | Admitting: Gastroenterology

## 2019-04-08 VITALS — BP 174/88 | HR 73 | Temp 96.6°F | Ht 63.0 in | Wt 184.0 lb

## 2019-04-08 DIAGNOSIS — R945 Abnormal results of liver function studies: Secondary | ICD-10-CM

## 2019-04-08 DIAGNOSIS — R7989 Other specified abnormal findings of blood chemistry: Secondary | ICD-10-CM

## 2019-04-08 DIAGNOSIS — R1310 Dysphagia, unspecified: Secondary | ICD-10-CM

## 2019-04-08 DIAGNOSIS — R1319 Other dysphagia: Secondary | ICD-10-CM

## 2019-04-08 NOTE — Patient Instructions (Signed)
We have ordered an updated ultrasound for the future. I have also ordered labs to complete when you are able.  I will talk with Dr. Oneida Alar about the next best step. In the meantime, chew well, eat small bites, stick with softer textures, and sit upright while eating.  Further recommendations to follow!

## 2019-04-08 NOTE — Progress Notes (Addendum)
REVIEWED. PROCEED WITH MANOMETRY IN [place].  Referring Provider: Dettinger, Fransisca Kaufmann, MD Primary Care Physician:  Dettinger, Fransisca Kaufmann, MD  Chief Complaint  Patient presents with  . Dysphagia    "little bit"; hasn't went to [facility]    HPI:   Kara Knapp is a 48 y.o. female presenting today with a history of dysphagia, abnormal LFTs with liver biopsy noting stage 2 of 4 bridging fibrosis likely due to frequent iron infusions. Previously with constipation. EGD in 2018, colonoscopy due in 2023-2028.   Several years ago, she had been referred to [facility] due to dysphagia. She finally completed recently May 2020, with recommendations to complete BPE, followed by EGD with empiric dilation and esophageal biopsies, then likely manometry. She has already completed a BPE previously in April 2018 with diffuse esophageal dysmotility with prolonged retention of contrast in esophagus. Numerous secondary and scattered tertiary waves. She would like to keep her care here with further diagnostic evaluation locally due to transportation.   Denies constipation. No melena or hematochezia. No abdominal pian. Denis GERD. Dysphagia is chronic. If taking her time, she won't choke on tough textures. No odynophagia. Swallowing is better.   Liver: Negative Hep B surface antigen, negative Hep C antibody Feb 2018 (see scanned in labs).  Past Medical History:  Diagnosis Date  . Anemia of chronic disease   . Asthma   . Blind left eye   . Bronchitis   . Cataract   . Cholecystitis, acute 05/26/2013   Status post cholecystectomy  . Chronic abdominal pain   . Chronic diarrhea   . Depression   . Diabetic foot ulcer ([place]) 03/01/2015  . Diastolic heart failure ([place])   . ESRD on hemodialysis ([place])    Started diaylsis 12/29/15  . Essential hypertension   . Fibroids   . Glaucoma   . History of blood transfusion   . History of pneumonia   . Hyperlipidemia   . Insulin-dependent diabetes mellitus with  retinopathy ([place])   . Liver fibrosis    Negative Hep B surface antigen, negative Hep C antibody Feb 2018 (see scanned in labs).  . Neuropathy   . Osteomyelitis ([place])    Toe on left foot    Past Surgical History:  Procedure Laterality Date  . A/V SHUNTOGRAM N/A 10/25/2016   Procedure: A/V Shuntogram - Right Arm;  Surgeon: Waynetta Sandy, MD;  Location: [place];  Service: Cardiovascular;  Laterality: N/A;  . A/V SHUNTOGRAM N/A 03/05/2018   Procedure: A/V SHUNTOGRAM - Right Arm;  Surgeon: Waynetta Sandy, MD;  Location: [place] [facility];  Service: Cardiovascular;  Laterality: N/A;  . AV FISTULA PLACEMENT Right 10/17/2015   Procedure: INSERTION OF ARTERIOVENOUS GORE-TEX GRAFT RIGHT UPPER ARM WITH ACUSEAL;  Surgeon: Conrad Savanna, MD;  Location: [facility];  Service: Vascular;  Laterality: Right;  . CATARACT EXTRACTION W/ INTRAOCULAR LENS IMPLANT Bilateral   . CESAREAN SECTION    . CHOLECYSTECTOMY N/A 05/25/2013   Procedure: LAPAROSCOPIC CHOLECYSTECTOMY;  Surgeon: Jamesetta So, MD;  Location: [facility];  Service: General;  Laterality: N/A;  . COLONOSCOPY WITH PROPOFOL N/A 10/02/2016   two 3 to 5 mm polyps in the descending colon, three 2 to 3 mm polyps in the rectum, random colon biopsies, rectal bleeding due to internal hemorrhoids, friability with no bleeding at the anus status post biopsy.  Surgical pathology found the polyps to be a mix of hyperplastic and tubular adenoma, random colon biopsies  to be benign colonic mucosa, and the anal biopsies to be anal skin tag.   Marland Kitchen ESOPHAGOGASTRODUODENOSCOPY (EGD) WITH PROPOFOL N/A 10/02/2016   mucosal nodule in the esophagus status post biopsy, moderate gastritis status post biopsy, mild duodenitis. esophageal biopsy to be benign, gastric biopsies to be gastritis due to aspirin use, and duodenal biopsies to be duodenitis due to aspirin use  . EYE SURGERY Bilateral   . IR THROMBECTOMY AV FISTULA W/THROMBOLYSIS/PTA INC/SHUNT/IMG RIGHT  Right 12/26/2018  . IR THROMBECTOMY AV FISTULA W/THROMBOLYSIS/PTA/STENT INC/SHUNT/IMG RT Right 08/08/2018  . IR US GUIDE VASC ACCESS RIGHT  08/08/2018  . IR US GUIDE VASC ACCESS RIGHT  12/26/2018  . PARS PLANA VITRECTOMY Left 11/24/2014   Procedure: PARS PLANA VITRECTOMY WITH 25 GAUGE;  Surgeon: Hurman Horn, MD;  Location: [place] [facility];  Service: Ophthalmology;  Laterality: Left;  . PERIPHERAL VASCULAR BALLOON ANGIOPLASTY Right 03/05/2018   Procedure: PERIPHERAL VASCULAR BALLOON ANGIOPLASTY;  Surgeon: Waynetta Sandy, MD;  Location: [place] [facility];  Service: Cardiovascular;  Laterality: Right;  arm fistula  . PERIPHERAL VASCULAR CATHETERIZATION N/A 04/28/2015   Procedure: Bilateral Upper Extremity Venography;  Surgeon: Conrad Ash Grove, MD;  Location: [place] [facility];  Service: Cardiovascular;  Laterality: N/A;  . PHOTOCOAGULATION WITH LASER Left 11/24/2014   Procedure: PHOTOCOAGULATION WITH LASER;  Surgeon: Hurman Horn, MD;  Location: [place] [facility];  Service: Ophthalmology;  Laterality: Left;  with insertion of silicone oil  . SAVORY DILATION N/A 10/02/2016   Procedure: SAVORY DILATION;  Surgeon: Danie Binder, MD;  [facility];  Service: Endoscopy;  Laterality: N/A;  . TUBAL LIGATION      Current Outpatient Medications  Medication Sig Dispense Refill  . azelastine (ASTELIN) 0.1 % nasal spray Place 1 spray into both nostrils 2 (two) times daily. Use in each nostril as directed (Patient taking differently: 1 spray as needed. Use in each nostril as directed) 30 mL 12  . brimonidine-timolol (COMBIGAN) 0.2-0.5 % ophthalmic solution Place 1 drop into both eyes daily.     . cholecalciferol (VITAMIN D) 1000 units tablet Take 1,000 Units by mouth daily.     . Continuous Blood Gluc Sensor (FREESTYLE LIBRE 14 DAY SENSOR) MISC Inject 1 each into the skin every 14 (fourteen) days. Use as directed. 2 each 2  . diclofenac sodium (VOLTAREN) 1 % GEL Apply 2 g topically 4 (four) times daily. (Patient  taking differently: Apply 2 g topically as needed. ) 200 g 3  . gabapentin (NEURONTIN) 100 MG capsule Take 1 capsule (100 mg total) by mouth 3 (three) times daily. 90 capsule 3  . glucose blood (PRODIGY NO CODING BLOOD GLUC) test strip USE TO CHECK BLOOD SUGAR TWICE DAILY. Dx E11.22 200 each 3  . Insulin Glargine (LANTUS SOLOSTAR) 100 UNIT/ML Solostar Pen Inject 18-50 Units into the skin at bedtime. (Patient taking differently: Inject 20 Units into the skin at bedtime. ) 5 pen 11  . Insulin Pen Needle (TRUEPLUS PEN NEEDLES) 31G X 5 MM MISC Use daily with insulin Dx E11.22 100 each 3  . lidocaine-prilocaine (EMLA) cream Apply 1 application topically daily as needed (arm port).    Marland Kitchen linagliptin (TRADJENTA) 5 MG TABS tablet Take 1 tablet (5 mg total) by mouth daily. 90 tablet 3  . pantoprazole (PROTONIX) 40 MG tablet TAKE 1 BY MOUTH DAILY 30 minutes before breakfast 90 tablet 3  . Probiotic Product (CULTURELLE PROBIOTICS PO) Take by mouth.    Marland Kitchen PRODIGY TWIST TOP LANCETS 28G  MISC USE TO CHECK BLOOD SUGAR UP TO FOUR TIMES DAILY. 100 each 1  . sertraline (ZOLOFT) 100 MG tablet Take 1.5 tablets (150 mg total) by mouth daily. 135 tablet 0  . sevelamer carbonate (RENVELA) 800 MG tablet Take 2,400 mg by mouth See admin instructions. Take 2418m by mouth three times daily with means and 8064mwith snacks    . traMADol (ULTRAM) 50 MG tablet Take 1 tablet (50 mg total) by mouth every 8 (eight) hours as needed. 30 tablet 0  . traZODone (DESYREL) 50 MG tablet Take 0.5-1 tablets (25-50 mg total) by mouth at bedtime as needed. for sleep 90 tablet 3   No current facility-administered medications for this visit.     Allergies as of 04/08/2019 - Review Complete 04/08/2019  Allergen Reaction Noted  . Bactrim [sulfamethoxazole-trimethoprim] Nausea And Vomiting 05/21/2013  . Prednisone Other (See Comments) 07/09/2013    Family History  Problem Relation Age of Onset  . COPD Mother   . Cancer Father   . Lymphoma  Father   . Diabetes Sister   . Deep vein thrombosis Sister   . Diabetes Brother   . Hyperlipidemia Brother   . Hypertension Brother   . Mental retardation Sister   . Alcohol abuse Paternal Grandmother   . Colon cancer Neg Hx   . Liver disease Neg Hx     Social History   Socioeconomic History  . Marital status: Single    Spouse name: Not on file  . Number of children: 5  . Years of education: GED  . Highest education level: Not on file  Occupational History  . Not on file  Social Needs  . Financial resource strain: Not on file  . Food insecurity    Worry: Not on file    Inability: Not on file  . Transportation needs    Medical: Not on file    Non-medical: Not on file  Tobacco Use  . Smoking status: Current Every Day Smoker    Packs/day: 1.50    Years: 23.00    Pack years: 34.50    Types: Cigarettes  . Smokeless tobacco: Never Used  . Tobacco comment: one pack daily  Substance and Sexual Activity  . Alcohol use: No    Alcohol/week: 0.0 standard drinks    Frequency: Never  . Drug use: No    Comment: Sober for 8 years  . Sexual activity: Not Currently    Birth control/protection: Surgical  Lifestyle  . Physical activity    Days per week: Not on file    Minutes per session: Not on file  . Stress: Not on file  Relationships  . Social coHerbalistn phone: Not on file    Gets together: Not on file    Attends religious service: Not on file    Active member of club or organization: Not on file    Attends meetings of clubs or organizations: Not on file    Relationship status: Not on file  Other Topics Concern  . Not on file  Social History Narrative  . Not on file    Review of Systems: Gen: Denies fever, chills, anorexia. Denies fatigue, weakness, weight loss.  CV: Denies chest pain, palpitations, syncope, peripheral edema, and claudication. Resp: Denies dyspnea at rest, cough, wheezing, coughing up blood, and pleurisy. GI: see HPI Derm: Denies  rash, itching, dry skin Psych: Denies depression, anxiety, memory loss, confusion. No homicidal or suicidal ideation.  Heme: Denies bruising, bleeding,  and enlarged lymph nodes.  Physical Exam: BP (!) 174/88   Pulse 73   Temp (!) 96.6 F (35.9 C) (Temporal)   Ht 5' 3"  (1.6 m)   Wt 184 lb (83.5 kg)   LMP 05/05/2015   BMI 32.59 kg/m  General:   Alert and oriented. No distress noted. Sallow-appearing Head:  Normocephalic and atraumatic. Eyes:  Conjuctiva clear without scleral icterus. Abdomen:  +BS, soft, non-tender and non-distended. No rebound or guarding. No HSM or masses noted. Msk:  Symmetrical without gross deformities. Normal posture. Extremities:  Without edema. Neurologic:  Alert and  oriented x4 Psych:  Alert and cooperative. Normal mood and affect.

## 2019-04-09 DIAGNOSIS — N250 Renal osteodystrophy: Secondary | ICD-10-CM | POA: Diagnosis not present

## 2019-04-09 DIAGNOSIS — Z992 Dependence on renal dialysis: Secondary | ICD-10-CM | POA: Diagnosis not present

## 2019-04-09 DIAGNOSIS — N186 End stage renal disease: Secondary | ICD-10-CM | POA: Diagnosis not present

## 2019-04-09 DIAGNOSIS — D631 Anemia in chronic kidney disease: Secondary | ICD-10-CM | POA: Diagnosis not present

## 2019-04-09 DIAGNOSIS — D509 Iron deficiency anemia, unspecified: Secondary | ICD-10-CM | POA: Diagnosis not present

## 2019-04-10 ENCOUNTER — Other Ambulatory Visit: Payer: Self-pay

## 2019-04-10 ENCOUNTER — Ambulatory Visit (INDEPENDENT_AMBULATORY_CARE_PROVIDER_SITE_OTHER): Payer: Medicaid Other | Admitting: Licensed Clinical Social Worker

## 2019-04-10 ENCOUNTER — Encounter (HOSPITAL_COMMUNITY): Payer: Self-pay | Admitting: Licensed Clinical Social Worker

## 2019-04-10 DIAGNOSIS — F330 Major depressive disorder, recurrent, mild: Secondary | ICD-10-CM
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
## 2019-04-13 DIAGNOSIS — N250 Renal osteodystrophy: Secondary | ICD-10-CM | POA: Diagnosis not present

## 2019-04-13 DIAGNOSIS — Z992 Dependence on renal dialysis: Secondary | ICD-10-CM | POA: Diagnosis not present

## 2019-04-13 DIAGNOSIS — D509 Iron deficiency anemia, unspecified: Secondary | ICD-10-CM | POA: Diagnosis not present

## 2019-04-13 DIAGNOSIS — N186 End stage renal disease: Secondary | ICD-10-CM | POA: Diagnosis not present

## 2019-04-13 DIAGNOSIS — D631 Anemia in chronic kidney disease: Secondary | ICD-10-CM | POA: Diagnosis not present

## 2019-04-14 ENCOUNTER — Ambulatory Visit (HOSPITAL_COMMUNITY): Payer: Medicare Other

## 2019-04-14 DIAGNOSIS — Z992 Dependence on renal dialysis: Secondary | ICD-10-CM | POA: Diagnosis not present

## 2019-04-14 DIAGNOSIS — D509 Iron deficiency anemia, unspecified: Secondary | ICD-10-CM | POA: Diagnosis not present

## 2019-04-14 DIAGNOSIS — N186 End stage renal disease: Secondary | ICD-10-CM | POA: Diagnosis not present

## 2019-04-14 DIAGNOSIS — D631 Anemia in chronic kidney disease: Secondary | ICD-10-CM | POA: Diagnosis not present

## 2019-04-14 DIAGNOSIS — N250 Renal osteodystrophy: Secondary | ICD-10-CM | POA: Diagnosis not present

## 2019-04-15 ENCOUNTER — Encounter: Payer: Self-pay | Admitting: Gastroenterology

## 2019-04-15 DIAGNOSIS — E113511 Type 2 diabetes mellitus with proliferative diabetic retinopathy with macular edema, right eye: Secondary | ICD-10-CM | POA: Diagnosis not present

## 2019-04-15 DIAGNOSIS — H211X1 Other vascular disorders of iris and ciliary body, right eye: Secondary | ICD-10-CM | POA: Diagnosis not present

## 2019-04-15 DIAGNOSIS — H4311 Vitreous hemorrhage, right eye: Secondary | ICD-10-CM | POA: Diagnosis not present

## 2019-04-15 DIAGNOSIS — H4720 Unspecified optic atrophy: Secondary | ICD-10-CM | POA: Diagnosis not present

## 2019-04-15 NOTE — Assessment & Plan Note (Signed)
Chronic but actually improved. Known dysmotility on BPE in 2018. Difficulty with transportation to [facility], but she has completed a telemedicine visit as of May 2020. Recommendations included repeat EGD/dilatation, esophageal biopsies, possible manometry. Will discuss with Dr. Oneida Alar. As she is noting improvement, may monitor for now. Known esophageal dysmotility and unlikely dilatation will be helpful.

## 2019-04-15 NOTE — Assessment & Plan Note (Signed)
In setting of iron overload. Continue follow-up with Hematology. Update US abdomen. History of F2 fibrosis on liver biopsy. Check CBC, CMP, INR.

## 2019-04-16 DIAGNOSIS — N186 End stage renal disease: Secondary | ICD-10-CM | POA: Diagnosis not present

## 2019-04-16 DIAGNOSIS — Z992 Dependence on renal dialysis: Secondary | ICD-10-CM | POA: Diagnosis not present

## 2019-04-16 DIAGNOSIS — D631 Anemia in chronic kidney disease: Secondary | ICD-10-CM | POA: Diagnosis not present

## 2019-04-16 DIAGNOSIS — D509 Iron deficiency anemia, unspecified: Secondary | ICD-10-CM | POA: Diagnosis not present

## 2019-04-16 DIAGNOSIS — N250 Renal osteodystrophy: Secondary | ICD-10-CM | POA: Diagnosis not present

## 2019-04-17 ENCOUNTER — Ambulatory Visit (HOSPITAL_COMMUNITY): Payer: Medicare Other

## 2019-04-18 DIAGNOSIS — N250 Renal osteodystrophy: Secondary | ICD-10-CM | POA: Diagnosis not present

## 2019-04-18 DIAGNOSIS — D509 Iron deficiency anemia, unspecified: Secondary | ICD-10-CM | POA: Diagnosis not present

## 2019-04-18 DIAGNOSIS — D631 Anemia in chronic kidney disease: Secondary | ICD-10-CM | POA: Diagnosis not present

## 2019-04-18 DIAGNOSIS — Z992 Dependence on renal dialysis: Secondary | ICD-10-CM | POA: Diagnosis not present

## 2019-04-18 DIAGNOSIS — N186 End stage renal disease: Secondary | ICD-10-CM | POA: Diagnosis not present

## 2019-04-19 NOTE — Progress Notes (Signed)
CC'ED TO PCP 

## 2019-04-21 ENCOUNTER — Telehealth: Payer: Self-pay | Admitting: Gastroenterology

## 2019-04-21 ENCOUNTER — Encounter: Payer: Self-pay | Admitting: Gastroenterology

## 2019-04-21 DIAGNOSIS — R1310 Dysphagia, unspecified: Secondary | ICD-10-CM

## 2019-04-21 DIAGNOSIS — R1319 Other dysphagia: Secondary | ICD-10-CM

## 2019-04-21 DIAGNOSIS — D509 Iron deficiency anemia, unspecified: Secondary | ICD-10-CM | POA: Diagnosis not present

## 2019-04-21 DIAGNOSIS — N250 Renal osteodystrophy: Secondary | ICD-10-CM | POA: Diagnosis not present

## 2019-04-21 DIAGNOSIS — D631 Anemia in chronic kidney disease: Secondary | ICD-10-CM | POA: Diagnosis not present

## 2019-04-21 DIAGNOSIS — N186 End stage renal disease: Secondary | ICD-10-CM | POA: Diagnosis not present

## 2019-04-21 DIAGNOSIS — Z992 Dependence on renal dialysis: Secondary | ICD-10-CM | POA: Diagnosis not present

## 2019-04-21 NOTE — Telephone Encounter (Signed)
Referral sent 

## 2019-04-21 NOTE — Telephone Encounter (Signed)
Erline Levine, please arrange follow-up with Dr. Oneida Alar in 3 months.

## 2019-04-21 NOTE — Addendum Note (Signed)
Addended by: Cheron Every on: 04/21/2019 08:52 AM   Modules accepted: Orders

## 2019-04-21 NOTE — Telephone Encounter (Signed)
[facility] clinical pool:  I spoke with Dr. Oneida Alar and wanted to update patient on next step.   Per Dr. Oneida Alar, refer for manometry in [place] due to chronic dysphagia.

## 2019-04-21 NOTE — Telephone Encounter (Signed)
PATIENT SCHEDULED AND LETTER SENT  °

## 2019-04-23 ENCOUNTER — Telehealth: Payer: Self-pay

## 2019-04-23 DIAGNOSIS — N250 Renal osteodystrophy: Secondary | ICD-10-CM | POA: Diagnosis not present

## 2019-04-23 DIAGNOSIS — Z794 Long term (current) use of insulin: Secondary | ICD-10-CM | POA: Diagnosis not present

## 2019-04-23 DIAGNOSIS — D631 Anemia in chronic kidney disease: Secondary | ICD-10-CM | POA: Diagnosis not present

## 2019-04-23 DIAGNOSIS — D509 Iron deficiency anemia, unspecified: Secondary | ICD-10-CM | POA: Diagnosis not present

## 2019-04-23 DIAGNOSIS — N186 End stage renal disease: Secondary | ICD-10-CM | POA: Diagnosis not present

## 2019-04-23 DIAGNOSIS — E119 Type 2 diabetes mellitus without complications: Secondary | ICD-10-CM | POA: Diagnosis not present

## 2019-04-23 DIAGNOSIS — Z992 Dependence on renal dialysis: Secondary | ICD-10-CM | POA: Diagnosis not present

## 2019-04-23 NOTE — Telephone Encounter (Signed)
Called the patient's home phone. She is not available. Presently she is at dialysis. I left a message with the paerson answering the phone to have the patient call me back about her referral.

## 2019-04-24 ENCOUNTER — Other Ambulatory Visit: Payer: Self-pay

## 2019-04-24 ENCOUNTER — Ambulatory Visit (HOSPITAL_COMMUNITY)
Admission: RE | Admit: 2019-04-24 | Discharge: 2019-04-24 | Disposition: A | Discharge status: Home / Self Care | Payer: Medicare Other | Source: Ambulatory Visit | Attending: Gastroenterology | Admitting: Gastroenterology

## 2019-04-24 DIAGNOSIS — R1319 Other dysphagia: Secondary | ICD-10-CM

## 2019-04-24 DIAGNOSIS — N261 Atrophy of kidney (terminal): Secondary | ICD-10-CM | POA: Diagnosis not present

## 2019-04-24 DIAGNOSIS — R945 Abnormal results of liver function studies: Secondary | ICD-10-CM | POA: Insufficient documentation

## 2019-04-24 DIAGNOSIS — R1310 Dysphagia, unspecified: Secondary | ICD-10-CM

## 2019-04-24 DIAGNOSIS — R7989 Other specified abnormal findings of blood chemistry: Secondary | ICD-10-CM

## 2019-04-24 NOTE — Telephone Encounter (Signed)
Spoke with the patient. She agrees to an esophageal manometry on 05/06/19 at 10:30 am. Reviewed instructions for procedure. Written instructions mailed.

## 2019-04-25 DIAGNOSIS — Z992 Dependence on renal dialysis: Secondary | ICD-10-CM | POA: Diagnosis not present

## 2019-04-25 DIAGNOSIS — N250 Renal osteodystrophy: Secondary | ICD-10-CM | POA: Diagnosis not present

## 2019-04-25 DIAGNOSIS — D509 Iron deficiency anemia, unspecified: Secondary | ICD-10-CM | POA: Diagnosis not present

## 2019-04-25 DIAGNOSIS — D631 Anemia in chronic kidney disease: Secondary | ICD-10-CM | POA: Diagnosis not present

## 2019-04-25 DIAGNOSIS — N186 End stage renal disease: Secondary | ICD-10-CM | POA: Diagnosis not present

## 2019-04-27 ENCOUNTER — Encounter: Payer: Self-pay | Admitting: Podiatry

## 2019-04-27 ENCOUNTER — Other Ambulatory Visit: Payer: Self-pay | Admitting: Podiatry

## 2019-04-27 ENCOUNTER — Other Ambulatory Visit: Payer: Self-pay

## 2019-04-27 ENCOUNTER — Ambulatory Visit (INDEPENDENT_AMBULATORY_CARE_PROVIDER_SITE_OTHER): Payer: Medicare Other

## 2019-04-27 ENCOUNTER — Ambulatory Visit (INDEPENDENT_AMBULATORY_CARE_PROVIDER_SITE_OTHER): Payer: Medicare Other | Admitting: Podiatry

## 2019-04-27 DIAGNOSIS — S92002A Unspecified fracture of left calcaneus, initial encounter for closed fracture: Secondary | ICD-10-CM | POA: Diagnosis not present

## 2019-04-27 DIAGNOSIS — M79672 Pain in left foot: Secondary | ICD-10-CM

## 2019-04-27 NOTE — Progress Notes (Signed)
Subjective:   Patient ID: Kara Knapp, female   DOB: 48 y.o.   MRN: [id number]   HPI Patient states having a lot of pain in the left heel and states that she went to the emergency room is been just having trouble since.  Patient smokes 1-1/2 packs/day and does have diabetes under reasonable control   Review of Systems  All other systems reviewed and are negative.       Objective:  Physical Exam  Neurovascular status unchanged with patient found to have severe discomfort in the left heel bone and into the dorsum of the left foot     Assessment:  Possibility for fracture or other inflammatory process     Plan:  Reviewed condition and at this point I did review x-ray indicating fracture and placed her in an air fracture walker to try to stabilize immobilize.  I do not think she is a good surgical candidate currently  X-rays indicate there is a fracture of the calcaneus left that should heal hopefully uneventfully but may create arthritis subtalar joint but nothing else we can do chronically

## 2019-04-28 DIAGNOSIS — N250 Renal osteodystrophy: Secondary | ICD-10-CM | POA: Diagnosis not present

## 2019-04-28 DIAGNOSIS — D631 Anemia in chronic kidney disease: Secondary | ICD-10-CM | POA: Diagnosis not present

## 2019-04-28 DIAGNOSIS — Z992 Dependence on renal dialysis: Secondary | ICD-10-CM | POA: Diagnosis not present

## 2019-04-28 DIAGNOSIS — D509 Iron deficiency anemia, unspecified: Secondary | ICD-10-CM | POA: Diagnosis not present

## 2019-04-28 DIAGNOSIS — N186 End stage renal disease: Secondary | ICD-10-CM | POA: Diagnosis not present

## 2019-04-28 NOTE — Progress Notes (Signed)
Subtle nodularity, could be dealing with progression to cirrhosis. Spleen is top normal size. Needs updated labs as previously ordered.

## 2019-04-29 ENCOUNTER — Telehealth: Payer: Self-pay

## 2019-04-29 DIAGNOSIS — N186 End stage renal disease: Secondary | ICD-10-CM | POA: Diagnosis not present

## 2019-04-29 DIAGNOSIS — Z992 Dependence on renal dialysis: Secondary | ICD-10-CM | POA: Diagnosis not present

## 2019-04-29 NOTE — Telephone Encounter (Signed)
I have left a message for the patient to call me back today to confirm she call go for COVID19 testing tomorrow. She is scheduled for an esophageal mano 05/06/19.

## 2019-04-29 NOTE — Telephone Encounter (Signed)
Spoke with the patient. She is unable to go for her COVID 19 testing. She is on dialysis and she uses transportation provided by the county. She is unable to get other transportation. She cannot isolate. She has a Optician, dispensing that comes to her home daily. She asks to cancel the procedure.  I will call tomorrow to speak with her again before sending the referral back to Roseanne Kaufman, NP

## 2019-04-30 ENCOUNTER — Other Ambulatory Visit (HOSPITAL_COMMUNITY): Payer: Medicare Other

## 2019-04-30 ENCOUNTER — Other Ambulatory Visit (HOSPITAL_COMMUNITY): Admission: RE | Admit: 2019-04-30 | Payer: Medicare Other | Source: Ambulatory Visit

## 2019-04-30 DIAGNOSIS — D631 Anemia in chronic kidney disease: Secondary | ICD-10-CM | POA: Diagnosis not present

## 2019-04-30 DIAGNOSIS — N186 End stage renal disease: Secondary | ICD-10-CM | POA: Diagnosis not present

## 2019-04-30 DIAGNOSIS — Z992 Dependence on renal dialysis: Secondary | ICD-10-CM | POA: Diagnosis not present

## 2019-04-30 DIAGNOSIS — N250 Renal osteodystrophy: Secondary | ICD-10-CM | POA: Diagnosis not present

## 2019-04-30 DIAGNOSIS — Z23 Encounter for immunization: Secondary | ICD-10-CM | POA: Diagnosis not present

## 2019-05-01 NOTE — Telephone Encounter (Signed)
Spoke with the patient. Discussed option of testing in [place] on a Friday, quarantining until the following Wednesday. She declines. She does not have private transportation. She will call me if she can come up with the transportation. Primary GI will be notified.

## 2019-05-01 NOTE — Telephone Encounter (Signed)
Thanks for the heads up!

## 2019-05-02 DIAGNOSIS — N250 Renal osteodystrophy: Secondary | ICD-10-CM | POA: Diagnosis not present

## 2019-05-02 DIAGNOSIS — N186 End stage renal disease: Secondary | ICD-10-CM | POA: Diagnosis not present

## 2019-05-02 DIAGNOSIS — D631 Anemia in chronic kidney disease: Secondary | ICD-10-CM | POA: Diagnosis not present

## 2019-05-02 DIAGNOSIS — Z992 Dependence on renal dialysis: Secondary | ICD-10-CM | POA: Diagnosis not present

## 2019-05-02 DIAGNOSIS — Z23 Encounter for immunization: Secondary | ICD-10-CM | POA: Diagnosis not present

## 2019-05-04 ENCOUNTER — Other Ambulatory Visit: Payer: Self-pay

## 2019-05-04 ENCOUNTER — Encounter (HOSPITAL_COMMUNITY): Payer: Self-pay | Admitting: Hematology

## 2019-05-04 ENCOUNTER — Inpatient Hospital Stay (HOSPITAL_COMMUNITY): Payer: Medicare Other | Attending: Hematology | Admitting: Hematology

## 2019-05-04 ENCOUNTER — Inpatient Hospital Stay (HOSPITAL_COMMUNITY): Payer: Medicare Other

## 2019-05-04 VITALS — BP 168/81 | HR 78 | Temp 97.5°F | Resp 18 | Wt 186.5 lb

## 2019-05-04 DIAGNOSIS — R7989 Other specified abnormal findings of blood chemistry: Secondary | ICD-10-CM

## 2019-05-04 DIAGNOSIS — I120 Hypertensive chronic kidney disease with stage 5 chronic kidney disease or end stage renal disease: Secondary | ICD-10-CM | POA: Diagnosis not present

## 2019-05-04 DIAGNOSIS — N186 End stage renal disease: Secondary | ICD-10-CM | POA: Diagnosis not present

## 2019-05-04 DIAGNOSIS — Z992 Dependence on renal dialysis: Secondary | ICD-10-CM | POA: Diagnosis not present

## 2019-05-04 DIAGNOSIS — R790 Abnormal level of blood mineral: Secondary | ICD-10-CM | POA: Diagnosis not present

## 2019-05-04 LAB — CBC WITH DIFFERENTIAL/PLATELET
Abs Immature Granulocytes: 0.01 10*3/uL (ref 0.00–0.07)
Basophils Absolute: 0.1 10*3/uL (ref 0.0–0.1)
Basophils Relative: 1 %
Eosinophils Absolute: 0.1 10*3/uL (ref 0.0–0.5)
Eosinophils Relative: 1 %
HCT: 33.5 % — ABNORMAL LOW (ref 36.0–46.0)
Hemoglobin: 10.6 g/dL — ABNORMAL LOW (ref 12.0–15.0)
Immature Granulocytes: 0 %
Lymphocytes Relative: 12 %
Lymphs Abs: 0.9 10*3/uL (ref 0.7–4.0)
MCH: 32 pg (ref 26.0–34.0)
MCHC: 31.6 g/dL (ref 30.0–36.0)
MCV: 101.2 fL — ABNORMAL HIGH (ref 80.0–100.0)
Monocytes Absolute: 0.5 10*3/uL (ref 0.1–1.0)
Monocytes Relative: 6 %
Neutro Abs: 6.4 10*3/uL (ref 1.7–7.7)
Neutrophils Relative %: 80 %
Platelets: 186 10*3/uL (ref 150–400)
RBC: 3.31 MIL/uL — ABNORMAL LOW (ref 3.87–5.11)
RDW: 13.8 % (ref 11.5–15.5)
WBC: 8 10*3/uL (ref 4.0–10.5)
nRBC: 0 % (ref 0.0–0.2)

## 2019-05-04 LAB — IRON AND TIBC
Iron: 40 ug/dL (ref 28–170)
Saturation Ratios: 11 % (ref 10.4–31.8)
TIBC: 368 ug/dL (ref 250–450)
UIBC: 328 ug/dL

## 2019-05-04 LAB — FERRITIN: Ferritin: 391 ng/mL — ABNORMAL HIGH (ref 11–307)

## 2019-05-04 LAB — VITAMIN B12: Vitamin B-12: 391 pg/mL (ref 180–914)

## 2019-05-04 LAB — FOLATE: Folate: 39.4 ng/mL (ref >5.9)
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
## 2019-05-04 NOTE — Patient Instructions (Addendum)
[facility] Discharge Instructions  You were seen today by Dr. Delton Coombes. He went over how you've been feeling since your last visit. He will get blood drawn today. He will see you back in 6 months for labs and follow up.   Thank you for choosing [place] at [facility] to provide your oncology and hematology care.  To afford each patient quality time with our provider, please arrive at least 15 minutes before your scheduled appointment time.   If you have a lab appointment with the [facility] please come in thru the  Main Entrance and check in at the main information desk  You need to re-schedule your appointment should you arrive 10 or more minutes late.  We strive to give you quality time with our providers, and arriving late affects you and other patients whose appointments are after yours.  Also, if you no show three or more times for appointments you may be dismissed from the clinic at the providers discretion.     Again, thank you for choosing [facility].  Our hope is that these requests will decrease the amount of time that you wait before being seen by our physicians.       _____________________________________________________________  Should you have questions after your visit to [facility], please contact our office at [phone] between the hours of 8:00 a.m. and 4:30 p.m.  Voicemails left after 4:00 p.m. will not be returned until the following business day.  For prescription refill requests, have your pharmacy contact our office and allow 72 hours.    Cancer Center Support Programs:   > Cancer Support Group  2nd Tuesday of the month 1pm-2pm, Journey Room

## 2019-05-04 NOTE — Progress Notes (Signed)
[place] [place], [place] [address]   CLINIC:  Medical Oncology/Hematology  PCP:  Dettinger, Fransisca Kaufmann, MD [place] [address] [phone]   REASON FOR VISIT:  Follow-up for elevated ferritin levels.  CURRENT THERAPY: Observation.   INTERVAL HISTORY:  Kara Knapp 48 y.o. female seen for follow-up of elevated ferritin levels.  Denies any iron infusion in the last 1 year.  Denies any blood transfusions.  Reports appetite of 100% and energy levels are 50%.  Trouble swallowing at times is stable.  Sleep difficulties also stable.  Occasional headaches have been reported and unchanged.  Denies any fevers, night sweats or weight loss in the last 6 months.  Denies any joint pains.  Denies any nausea, vomiting, diarrhea or constipation.    REVIEW OF SYSTEMS:  Review of Systems  HENT:   Positive for trouble swallowing.   Neurological: Positive for headaches.  Psychiatric/Behavioral: Positive for sleep disturbance.  All other systems reviewed and are negative.    PAST MEDICAL/SURGICAL HISTORY:  Past Medical History:  Diagnosis Date  . Anemia of chronic disease   . Asthma   . Blind left eye   . Bronchitis   . Cataract   . Cholecystitis, acute 05/26/2013   Status post cholecystectomy  . Chronic abdominal pain   . Chronic diarrhea   . Depression   . Diabetic foot ulcer ([place]) 03/01/2015  . Diastolic heart failure ([place])   . ESRD on hemodialysis ([facility] [place])    Started diaylsis 12/29/15  . Essential hypertension   . Fibroids   . Glaucoma   . History of blood transfusion   . History of pneumonia   . Hyperlipidemia   . Insulin-dependent diabetes mellitus with retinopathy   . Liver fibrosis    Negative Hep B surface antigen, negative Hep C antibody Feb 2018 (see scanned in labs).  . Neuropathy   . Osteomyelitis ([place])    Toe on left foot   Past Surgical History:  Procedure Laterality Date  . A/V SHUNTOGRAM N/A 10/25/2016   Procedure:  A/V Shuntogram - Right Arm;  Surgeon: Waynetta Sandy, MD;  Location: [facility];  Service: Cardiovascular;  Laterality: N/A;  . A/V SHUNTOGRAM N/A 03/05/2018   Procedure: A/V SHUNTOGRAM - Right Arm;  Surgeon: Waynetta Sandy, MD;  Location: [facility];  Service: Cardiovascular;  Laterality: N/A;  . AV FISTULA PLACEMENT Right 10/17/2015   Procedure: INSERTION OF ARTERIOVENOUS GORE-TEX GRAFT RIGHT UPPER ARM WITH ACUSEAL;  Surgeon: Conrad , MD;  Location: [facility];  Service: Vascular;  Laterality: Right;  . CATARACT EXTRACTION W/ INTRAOCULAR LENS IMPLANT Bilateral   . CESAREAN SECTION    . CHOLECYSTECTOMY N/A 05/25/2013   Procedure: LAPAROSCOPIC CHOLECYSTECTOMY;  Surgeon: Jamesetta So, MD;  Location: [facility];  Service: General;  Laterality: N/A;  . COLONOSCOPY WITH PROPOFOL N/A 10/02/2016   two 3 to 5 mm polyps in the descending colon, three 2 to 3 mm polyps in the rectum, random colon biopsies, rectal bleeding due to internal hemorrhoids, friability with no bleeding at the anus status post biopsy.  Surgical pathology found the polyps to be a mix of hyperplastic and tubular adenoma, random colon biopsies to be benign colonic mucosa, and the anal biopsies to be anal skin tag.   Marland Kitchen ESOPHAGOGASTRODUODENOSCOPY (EGD) WITH PROPOFOL N/A 10/02/2016   mucosal nodule in the esophagus status post biopsy, moderate gastritis status post biopsy, mild duodenitis. esophageal biopsy to be  benign, gastric biopsies to be gastritis due to aspirin use, and duodenal biopsies to be duodenitis due to aspirin use  . EYE SURGERY Bilateral   . IR THROMBECTOMY AV FISTULA W/THROMBOLYSIS/PTA INC/SHUNT/IMG RIGHT Right 12/26/2018  . IR THROMBECTOMY AV FISTULA W/THROMBOLYSIS/PTA/STENT INC/SHUNT/IMG RT Right 08/08/2018  . IR US GUIDE VASC ACCESS RIGHT  08/08/2018  . IR US GUIDE VASC ACCESS RIGHT  12/26/2018  . PARS PLANA VITRECTOMY Left 11/24/2014   Procedure: PARS PLANA VITRECTOMY WITH 25 GAUGE;  Surgeon:  Hurman Horn, MD;  Location: [facility];  Service: Ophthalmology;  Laterality: Left;  . PERIPHERAL VASCULAR BALLOON ANGIOPLASTY Right 03/05/2018   Procedure: PERIPHERAL VASCULAR BALLOON ANGIOPLASTY;  Surgeon: Waynetta Sandy, MD;  Location: [facility];  Service: Cardiovascular;  Laterality: Right;  arm fistula  . PERIPHERAL VASCULAR CATHETERIZATION N/A 04/28/2015   Procedure: Bilateral Upper Extremity Venography;  Surgeon: Conrad Cascade, MD;  Location: [facility];  Service: Cardiovascular;  Laterality: N/A;  . PHOTOCOAGULATION WITH LASER Left 11/24/2014   Procedure: PHOTOCOAGULATION WITH LASER;  Surgeon: Hurman Horn, MD;  Location: [facility];  Service: Ophthalmology;  Laterality: Left;  with insertion of silicone oil  . SAVORY DILATION N/A 10/02/2016   Procedure: SAVORY DILATION;  Surgeon: Danie Binder, MD;  [facility];  Service: Endoscopy;  Laterality: N/A;  . TUBAL LIGATION       SOCIAL HISTORY:  Social History   Socioeconomic History  . Marital status: Single    Spouse name: Not on file  . Number of children: 5  . Years of education: GED  . Highest education level: Not on file  Occupational History  . Not on file  Social Needs  . Financial resource strain: Not on file  . Food insecurity    Worry: Not on file    Inability: Not on file  . Transportation needs    Medical: Not on file    Non-medical: Not on file  Tobacco Use  . Smoking status: Current Every Day Smoker    Packs/day: 1.50    Years: 23.00    Pack years: 34.50    Types: Cigarettes  . Smokeless tobacco: Never Used  . Tobacco comment: one pack daily  Substance and Sexual Activity  . Alcohol use: No    Alcohol/week: 0.0 standard drinks    Frequency: Never  . Drug use: No    Comment: Sober for 8 years  . Sexual activity: Not Currently    Birth control/protection: Surgical  Lifestyle  . Physical activity    Days per week: Not on file    Minutes per session: Not on file  . Stress:  Not on file  Relationships  . Social Herbalist on phone: Not on file    Gets together: Not on file    Attends religious service: Not on file    Active member of club or organization: Not on file    Attends meetings of clubs or organizations: Not on file    Relationship status: Not on file  . Intimate partner violence    Fear of current or ex partner: Not on file    Emotionally abused: Not on file    Physically abused: Not on file    Forced sexual activity: Not on file  Other Topics Concern  . Not on file  Social History Narrative  . Not on file    FAMILY HISTORY:  Family History  Problem Relation Age of Onset  .  COPD Mother   . Cancer Father   . Lymphoma Father   . Diabetes Sister   . Deep vein thrombosis Sister   . Diabetes Brother   . Hyperlipidemia Brother   . Hypertension Brother   . Mental retardation Sister   . Alcohol abuse Paternal Grandmother   . Colon cancer Neg Hx   . Liver disease Neg Hx     CURRENT MEDICATIONS:  Outpatient Encounter Medications as of 05/04/2019  Medication Sig  . azelastine (ASTELIN) 0.1 % nasal spray Place 1 spray into both nostrils 2 (two) times daily. Use in each nostril as directed (Patient taking differently: 1 spray as needed. Use in each nostril as directed)  . brimonidine-timolol (COMBIGAN) 0.2-0.5 % ophthalmic solution Place 1 drop into both eyes daily.   . cholecalciferol (VITAMIN D) 1000 units tablet Take 1,000 Units by mouth daily.   . Continuous Blood Gluc Sensor (FREESTYLE LIBRE 14 DAY SENSOR) MISC Inject 1 each into the skin every 14 (fourteen) days. Use as directed.  . gabapentin (NEURONTIN) 100 MG capsule Take 1 capsule (100 mg total) by mouth 3 (three) times daily.  Marland Kitchen glucose blood (PRODIGY NO CODING BLOOD GLUC) test strip USE TO CHECK BLOOD SUGAR TWICE DAILY. Dx E11.22  . Insulin Glargine (LANTUS SOLOSTAR) 100 UNIT/ML Solostar Pen Inject 18-50 Units into the skin at bedtime. (Patient taking differently: Inject 20  Units into the skin at bedtime. )  . Insulin Pen Needle (TRUEPLUS PEN NEEDLES) 31G X 5 MM MISC Use daily with insulin Dx E11.22  . lidocaine-prilocaine (EMLA) cream Apply 1 application topically daily as needed (arm port).  Marland Kitchen linagliptin (TRADJENTA) 5 MG TABS tablet Take 1 tablet (5 mg total) by mouth daily.  . multivitamin (RENA-VIT) TABS tablet Take 1 tablet by mouth daily.  . pantoprazole (PROTONIX) 40 MG tablet TAKE 1 BY MOUTH DAILY 30 minutes before breakfast  . Probiotic Product (CULTURELLE PROBIOTICS PO) Take by mouth.  Marland Kitchen PRODIGY TWIST TOP LANCETS 28G MISC USE TO CHECK BLOOD SUGAR UP TO FOUR TIMES DAILY.  Marland Kitchen sertraline (ZOLOFT) 100 MG tablet Take 1.5 tablets (150 mg total) by mouth daily.  . sevelamer carbonate (RENVELA) 800 MG tablet Take 2,400 mg by mouth See admin instructions. Take 2423m by mouth three times daily with means and 8044mwith snacks  . diclofenac sodium (VOLTAREN) 1 % GEL Apply 2 g topically 4 (four) times daily. (Patient not taking: Reported on 05/04/2019)  . traMADol (ULTRAM) 50 MG tablet Take 1 tablet (50 mg total) by mouth every 8 (eight) hours as needed. (Patient not taking: Reported on 05/04/2019)  . traZODone (DESYREL) 50 MG tablet Take 0.5-1 tablets (25-50 mg total) by mouth at bedtime as needed. for sleep (Patient not taking: Reported on 05/04/2019)  . [DISCONTINUED] amoxicillin (AMOXIL) 500 MG capsule Take 500 mg by mouth 2 (two) times daily.  . [DISCONTINUED] clindamycin (CLEOCIN) 300 MG capsule Take 300 mg by mouth 4 (four) times daily.   No facility-administered encounter medications on file as of 05/04/2019.     ALLERGIES:  Allergies  Allergen Reactions  . Bactrim [Sulfamethoxazole-Trimethoprim] Nausea And Vomiting  . Prednisone Other (See Comments)    "I was wide open and couldn't eat" per pt.      PHYSICAL EXAM:  ECOG Performance status: 1  Vitals:   05/04/19 1037  BP: (!) 168/81  Pulse: 78  Resp: 18  Temp: (!) 97.5 F (36.4 C)  SpO2: 98%    Filed Weights   05/04/19 1037  Weight: 186 lb 8 oz (84.6 kg)    Physical Exam Vitals signs reviewed.  Constitutional:      Appearance: Normal appearance.  Cardiovascular:     Rate and Rhythm: Normal rate and regular rhythm.     Heart sounds: Normal heart sounds.  Pulmonary:     Effort: Pulmonary effort is normal.     Breath sounds: Normal breath sounds.  Abdominal:     General: There is no distension.     Palpations: Abdomen is soft. There is no mass.  Skin:    General: Skin is warm.  Neurological:     General: No focal deficit present.     Mental Status: She is alert and oriented to person, place, and time.  Psychiatric:        Mood and Affect: Mood normal.        Behavior: Behavior normal.      LABORATORY DATA:  I have reviewed the labs as listed.  CBC    Component Value Date/Time   WBC 7.1 05/08/2019 1423   RBC 3.30 (L) 05/08/2019 1423   HGB 10.6 (L) 05/08/2019 1423   HGB 12.1 05/14/2018 1357   HCT 31.5 (L) 05/08/2019 1423   HCT 35.9 05/14/2018 1357   PLT 169 05/08/2019 1423   PLT 171 05/14/2018 1357   MCV 95.5 05/08/2019 1423   MCV 99 (H) 05/14/2018 1357   MCH 32.1 05/08/2019 1423   MCHC 33.7 05/08/2019 1423   RDW 13.2 05/08/2019 1423   RDW 12.6 05/14/2018 1357   LYMPHSABS 1,179 05/08/2019 1423   LYMPHSABS 1.1 05/14/2018 1357   MONOABS 0.5 05/04/2019 1145   EOSABS 92 05/08/2019 1423   EOSABS 0.1 05/14/2018 1357   BASOSABS 57 05/08/2019 1423   BASOSABS 0.1 05/14/2018 1357   CMP Latest Ref Rng & Units 05/08/2019 12/26/2018 10/01/2018  Glucose 70 - 99 mg/dL - 142(H) 139(H)  BUN 6 - 20 mg/dL - 53(H) 27(H)  Creatinine 0.44 - 1.00 mg/dL - 7.36(H) 5.37(H)  Sodium 135 - 145 mmol/L - 137 137  Potassium 3.5 - 5.1 mmol/L - 4.8 4.7  Chloride 98 - 111 mmol/L - 99 98  CO2 22 - 32 mmol/L - 24 25  Calcium 8.9 - 10.3 mg/dL - 9.2 8.6(L)  Total Protein 6.1 - 8.1 g/dL 7.0 - 8.1  Total Bilirubin 0.2 - 1.2 mg/dL 0.3 - 0.3  Alkaline Phos 38 - 126 U/L - - 276(H)  AST 10  - 35 U/L 15 - 17  ALT 6 - 29 U/L 13 - 15       DIAGNOSTIC IMAGING:  I have independently reviewed the scans and discussed with the patient.      ASSESSMENT & PLAN:   Elevated ferritin 1.  Elevated ferritin levels: - She was previously tested for hemochromatosis mutation panel which was negative. -Liver biopsy on 02/07/2018 showed secondary iron overload.  She was receiving iron infusions at dialysis. - She has not received any parenteral iron therapy in the last 1 year. - She denies any family history of hemochromatosis. - Labs today showed ferritin improved to 391 with percent saturation of 11.  Ferritin was 633 on 10/01/2018. - No active intervention necessary at this time.  She was told to avoid any iron supplements and vitamin C. - She will come back in 6 months with repeat labs.  2.  ESRD: - She had ESRD from diabetes.  She has been receiving dialysis for the last 3 years. -She gets dialyzed on Tuesdays, Thursdays  and Saturdays.      Orders placed this encounter:  Orders Placed This Encounter  Procedures  . CBC with Differential/Platelet  . Iron and TIBC  . Ferritin  . Vitamin B12  . Folate      Derek Jack, MD [place] [phone]

## 2019-05-05 DIAGNOSIS — Z23 Encounter for immunization: Secondary | ICD-10-CM | POA: Diagnosis not present

## 2019-05-05 DIAGNOSIS — N250 Renal osteodystrophy: Secondary | ICD-10-CM | POA: Diagnosis not present

## 2019-05-05 DIAGNOSIS — D631 Anemia in chronic kidney disease: Secondary | ICD-10-CM | POA: Diagnosis not present

## 2019-05-05 DIAGNOSIS — Z992 Dependence on renal dialysis: Secondary | ICD-10-CM | POA: Diagnosis not present

## 2019-05-05 DIAGNOSIS — N186 End stage renal disease: Secondary | ICD-10-CM | POA: Diagnosis not present

## 2019-05-06 ENCOUNTER — Encounter (HOSPITAL_COMMUNITY): Payer: Self-pay

## 2019-05-06 ENCOUNTER — Ambulatory Visit (HOSPITAL_COMMUNITY): Admit: 2019-05-06 | Payer: Medicare Other | Admitting: Gastroenterology

## 2019-05-06 SURGERY — MANOMETRY, ESOPHAGUS
Anesthesia: Choice

## 2019-05-08 DIAGNOSIS — R945 Abnormal results of liver function studies: Secondary | ICD-10-CM | POA: Diagnosis not present

## 2019-05-09 DIAGNOSIS — N186 End stage renal disease: Secondary | ICD-10-CM | POA: Diagnosis not present

## 2019-05-09 DIAGNOSIS — N250 Renal osteodystrophy: Secondary | ICD-10-CM | POA: Diagnosis not present

## 2019-05-09 DIAGNOSIS — Z23 Encounter for immunization: Secondary | ICD-10-CM | POA: Diagnosis not present

## 2019-05-09 DIAGNOSIS — D631 Anemia in chronic kidney disease: Secondary | ICD-10-CM | POA: Diagnosis not present

## 2019-05-09 DIAGNOSIS — Z992 Dependence on renal dialysis: Secondary | ICD-10-CM | POA: Diagnosis not present

## 2019-05-09 LAB — CBC WITH DIFFERENTIAL/PLATELET
Absolute Monocytes: 447 cells/uL (ref 200–950)
Basophils Absolute: 57 cells/uL (ref 0–200)
Basophils Relative: 0.8 %
Eosinophils Absolute: 92 cells/uL (ref 15–500)
Eosinophils Relative: 1.3 %
HCT: 31.5 % — ABNORMAL LOW (ref 35.0–45.0)
Hemoglobin: 10.6 g/dL — ABNORMAL LOW (ref 11.7–15.5)
Lymphs Abs: 1179 cells/uL (ref 850–3900)
MCH: 32.1 pg (ref 27.0–33.0)
MCHC: 33.7 g/dL (ref 32.0–36.0)
MCV: 95.5 fL (ref 80.0–100.0)
MPV: 11 fL (ref 7.5–12.5)
Monocytes Relative: 6.3 %
Neutro Abs: 5325 cells/uL (ref 1500–7800)
Neutrophils Relative %: 75 %
Platelets: 169 10*3/uL (ref 140–400)
RBC: 3.3 10*6/uL — ABNORMAL LOW (ref 3.80–5.10)
RDW: 13.2 % (ref 11.0–15.0)
Total Lymphocyte: 16.6 %
WBC: 7.1 10*3/uL (ref 3.8–10.8)

## 2019-05-09 LAB — HEPATIC FUNCTION PANEL
AG Ratio: 1.3 (calc) (ref 1.0–2.5)
ALT: 13 U/L (ref 6–29)
AST: 15 U/L (ref 10–35)
Albumin: 3.9 g/dL (ref 3.6–5.1)
Alkaline phosphatase (APISO): 180 U/L — ABNORMAL HIGH (ref 31–125)
Bilirubin, Direct: 0.1 mg/dL (ref 0.0–0.2)
Globulin: 3.1 g/dL (calc) (ref 1.9–3.7)
Indirect Bilirubin: 0.2 mg/dL (calc) (ref 0.2–1.2)
Total Bilirubin: 0.3 mg/dL (ref 0.2–1.2)
Total Protein: 7 g/dL (ref 6.1–8.1)

## 2019-05-09 LAB — PROTIME-INR
INR: 0.9
Prothrombin Time: 9.9 s (ref 9.0–11.5)

## 2019-05-10 DIAGNOSIS — R7989 Other specified abnormal findings of blood chemistry: Secondary | ICD-10-CM | POA: Insufficient documentation

## 2019-05-10 NOTE — Assessment & Plan Note (Signed)
1.  Elevated ferritin levels: - She was previously tested for hemochromatosis mutation panel which was negative. -Liver biopsy on 02/07/2018 showed secondary iron overload.  She was receiving iron infusions at dialysis. - She has not received any parenteral iron therapy in the last 1 year. - She denies any family history of hemochromatosis. - Labs today showed ferritin improved to 391 with percent saturation of 11.  Ferritin was 633 on 10/01/2018. - No active intervention necessary at this time.  She was told to avoid any iron supplements and vitamin C. - She will come back in 6 months with repeat labs.  2.  ESRD: - She had ESRD from diabetes.  She has been receiving dialysis for the last 3 years. -She gets dialyzed on Tuesdays, Thursdays and Saturdays.

## 2019-05-11 ENCOUNTER — Encounter (HOSPITAL_COMMUNITY): Payer: Self-pay | Admitting: Psychiatry

## 2019-05-11 ENCOUNTER — Other Ambulatory Visit: Payer: Self-pay

## 2019-05-11 ENCOUNTER — Ambulatory Visit (INDEPENDENT_AMBULATORY_CARE_PROVIDER_SITE_OTHER): Payer: Medicaid Other | Admitting: Psychiatry

## 2019-05-11 DIAGNOSIS — F3341 Major depressive disorder, recurrent, in partial remission: Secondary | ICD-10-CM | POA: Diagnosis not present

## 2019-05-11 MED ORDER — SERTRALINE HCL 100 MG PO TABS: 150.0000 mg | ORAL_TABLET | Freq: Every day | ORAL | 1 refills | Status: DC

## 2019-05-11 NOTE — Patient Instructions (Signed)
1.Continuesertraline 150 mg daily 2. Next appointment: in [place]

## 2019-05-12 DIAGNOSIS — N186 End stage renal disease: Secondary | ICD-10-CM | POA: Diagnosis not present

## 2019-05-12 DIAGNOSIS — Z992 Dependence on renal dialysis: Secondary | ICD-10-CM | POA: Diagnosis not present

## 2019-05-12 DIAGNOSIS — N250 Renal osteodystrophy: Secondary | ICD-10-CM | POA: Diagnosis not present

## 2019-05-12 DIAGNOSIS — D631 Anemia in chronic kidney disease: Secondary | ICD-10-CM | POA: Diagnosis not present

## 2019-05-12 DIAGNOSIS — Z23 Encounter for immunization: Secondary | ICD-10-CM | POA: Diagnosis not present

## 2019-05-14 ENCOUNTER — Other Ambulatory Visit (HOSPITAL_COMMUNITY): Payer: Self-pay | Admitting: Nephrology

## 2019-05-14 ENCOUNTER — Other Ambulatory Visit: Payer: Self-pay | Admitting: Radiology

## 2019-05-14 DIAGNOSIS — N186 End stage renal disease: Secondary | ICD-10-CM

## 2019-05-15 ENCOUNTER — Other Ambulatory Visit (HOSPITAL_COMMUNITY): Payer: Self-pay | Admitting: Nephrology

## 2019-05-15 ENCOUNTER — Encounter (HOSPITAL_COMMUNITY): Payer: Self-pay | Admitting: Interventional Radiology

## 2019-05-15 ENCOUNTER — Other Ambulatory Visit: Payer: Self-pay

## 2019-05-15 ENCOUNTER — Ambulatory Visit (HOSPITAL_COMMUNITY)
Admission: RE | Admit: 2019-05-15 | Discharge: 2019-05-15 | Disposition: A | Discharge status: Home / Self Care | Payer: Medicare Other | Source: Ambulatory Visit | Attending: Nephrology | Admitting: Nephrology

## 2019-05-15 DIAGNOSIS — Z79899 Other long term (current) drug therapy: Secondary | ICD-10-CM | POA: Diagnosis not present

## 2019-05-15 DIAGNOSIS — E1140 Type 2 diabetes mellitus with diabetic neuropathy, unspecified: Secondary | ICD-10-CM | POA: Insufficient documentation

## 2019-05-15 DIAGNOSIS — Y841 Kidney dialysis as the cause of abnormal reaction of the patient, or of later complication, without mention of misadventure at the time of the procedure: Secondary | ICD-10-CM | POA: Insufficient documentation

## 2019-05-15 DIAGNOSIS — T82868A Thrombosis of vascular prosthetic devices, implants and grafts, initial encounter: Secondary | ICD-10-CM | POA: Insufficient documentation

## 2019-05-15 DIAGNOSIS — F17210 Nicotine dependence, cigarettes, uncomplicated: Secondary | ICD-10-CM | POA: Insufficient documentation

## 2019-05-15 DIAGNOSIS — E785 Hyperlipidemia, unspecified: Secondary | ICD-10-CM | POA: Insufficient documentation

## 2019-05-15 DIAGNOSIS — Z992 Dependence on renal dialysis: Secondary | ICD-10-CM | POA: Diagnosis not present

## 2019-05-15 DIAGNOSIS — Z794 Long term (current) use of insulin: Secondary | ICD-10-CM | POA: Insufficient documentation

## 2019-05-15 DIAGNOSIS — I132 Hypertensive heart and chronic kidney disease with heart failure and with stage 5 chronic kidney disease, or end stage renal disease: Secondary | ICD-10-CM | POA: Diagnosis not present

## 2019-05-15 DIAGNOSIS — T82858A Stenosis of vascular prosthetic devices, implants and grafts, initial encounter: Secondary | ICD-10-CM | POA: Diagnosis not present

## 2019-05-15 DIAGNOSIS — N186 End stage renal disease: Secondary | ICD-10-CM

## 2019-05-15 DIAGNOSIS — J45909 Unspecified asthma, uncomplicated: Secondary | ICD-10-CM | POA: Insufficient documentation

## 2019-05-15 DIAGNOSIS — I5032 Chronic diastolic (congestive) heart failure: Secondary | ICD-10-CM | POA: Insufficient documentation

## 2019-05-15 DIAGNOSIS — D631 Anemia in chronic kidney disease: Secondary | ICD-10-CM | POA: Diagnosis not present

## 2019-05-15 DIAGNOSIS — E11319 Type 2 diabetes mellitus with unspecified diabetic retinopathy without macular edema: Secondary | ICD-10-CM | POA: Insufficient documentation

## 2019-05-15 DIAGNOSIS — M869 Osteomyelitis, unspecified: Secondary | ICD-10-CM | POA: Diagnosis not present

## 2019-05-15 HISTORY — PX: IR US GUIDE VASC ACCESS RIGHT: IMG2390

## 2019-05-15 HISTORY — PX: IR THROMBECTOMY AV FISTULA W/THROMBOLYSIS/PTA/STENT INC/SHUNT/IMG RT: IMG6120

## 2019-05-15 LAB — BASIC METABOLIC PANEL
Anion gap: 14 (ref 5–15)
BUN: 54 mg/dL — ABNORMAL HIGH (ref 6–20)
CO2: 22 mmol/L (ref 22–32)
Calcium: 9 mg/dL (ref 8.9–10.3)
Chloride: 99 mmol/L (ref 98–111)
Creatinine, Ser: 8.77 mg/dL — ABNORMAL HIGH (ref 0.44–1.00)
GFR calc Af Amer: 6 mL/min — ABNORMAL LOW (ref >60)
GFR calc non Af Amer: 5 mL/min — ABNORMAL LOW (ref >60)
Glucose, Bld: 108 mg/dL — ABNORMAL HIGH (ref 70–99)
Potassium: 5.9 mmol/L — ABNORMAL HIGH (ref 3.5–5.1)
Sodium: 135 mmol/L (ref 135–145)

## 2019-05-15 LAB — CBC WITH DIFFERENTIAL/PLATELET
Abs Immature Granulocytes: 0.01 10*3/uL (ref 0.00–0.07)
Basophils Absolute: 0.1 10*3/uL (ref 0.0–0.1)
Basophils Relative: 1 %
Eosinophils Absolute: 0.2 10*3/uL (ref 0.0–0.5)
Eosinophils Relative: 2 %
HCT: 34.6 % — ABNORMAL LOW (ref 36.0–46.0)
Hemoglobin: 11 g/dL — ABNORMAL LOW (ref 12.0–15.0)
Immature Granulocytes: 0 %
Lymphocytes Relative: 16 %
Lymphs Abs: 1.1 10*3/uL (ref 0.7–4.0)
MCH: 32.3 pg (ref 26.0–34.0)
MCHC: 31.8 g/dL (ref 30.0–36.0)
MCV: 101.5 fL — ABNORMAL HIGH (ref 80.0–100.0)
Monocytes Absolute: 0.5 10*3/uL (ref 0.1–1.0)
Monocytes Relative: 7 %
Neutro Abs: 5.3 10*3/uL (ref 1.7–7.7)
Neutrophils Relative %: 74 %
Platelets: 189 10*3/uL (ref 150–400)
RBC: 3.41 MIL/uL — ABNORMAL LOW (ref 3.87–5.11)
RDW: 13.5 % (ref 11.5–15.5)
WBC: 7.2 10*3/uL (ref 4.0–10.5)
nRBC: 0 % (ref 0.0–0.2)

## 2019-05-15 LAB — GLUCOSE, CAPILLARY: Glucose-Capillary: 118 mg/dL — ABNORMAL HIGH (ref 70–99)

## 2019-05-15 LAB — PROTIME-INR
INR: 1 (ref 0.8–1.2)
Prothrombin Time: 13.1 seconds (ref 11.4–15.2)

## 2019-05-15 MED ORDER — FENTANYL CITRATE (PF) 100 MCG/2ML IJ SOLN
INTRAMUSCULAR | Status: AC
  Filled 2019-05-15: qty 4

## 2019-05-15 MED ORDER — LIDOCAINE HCL 1 % IJ SOLN
INTRAMUSCULAR | Status: AC
  Filled 2019-05-15: qty 20

## 2019-05-15 MED ORDER — FENTANYL CITRATE (PF) 100 MCG/2ML IJ SOLN
INTRAMUSCULAR | Status: AC | PRN
  Administered 2019-05-15: 50 ug via INTRAVENOUS
  Administered 2019-05-15 (×3): 25 ug via INTRAVENOUS

## 2019-05-15 MED ORDER — CEFAZOLIN SODIUM-DEXTROSE 2-4 GM/100ML-% IV SOLN: 2.0000 g | INTRAVENOUS | Status: DC

## 2019-05-15 MED ORDER — SODIUM CHLORIDE 0.9 % IV SOLN: INTRAVENOUS | Status: DC

## 2019-05-15 MED ORDER — ALTEPLASE 2 MG IJ SOLR
INTRAMUSCULAR | Status: AC
  Filled 2019-05-15: qty 4

## 2019-05-15 MED ORDER — ALTEPLASE 100 MG IV SOLR
INTRAVENOUS | Status: AC | PRN
  Administered 2019-05-15 (×2): 2 mg

## 2019-05-15 MED ORDER — MIDAZOLAM HCL 2 MG/2ML IJ SOLN
INTRAMUSCULAR | Status: AC | PRN
  Administered 2019-05-15 (×2): 0.5 mg via INTRAVENOUS
  Administered 2019-05-15: 1 mg via INTRAVENOUS
  Administered 2019-05-15: 0.5 mg via INTRAVENOUS

## 2019-05-15 MED ORDER — IOHEXOL 300 MG/ML  SOLN
100.0000 mL | Freq: Once | INTRAMUSCULAR | Status: AC | PRN
  Administered 2019-05-15: 80 mL via INTRAVENOUS

## 2019-05-15 MED ORDER — MIDAZOLAM HCL 2 MG/2ML IJ SOLN
INTRAMUSCULAR | Status: AC
  Filled 2019-05-15: qty 4

## 2019-05-15 MED ORDER — IOHEXOL 300 MG/ML  SOLN: 50.0000 mL | Freq: Once | INTRAMUSCULAR | Status: DC | PRN

## 2019-05-15 MED ORDER — LIDOCAINE HCL 1 % IJ SOLN
INTRAMUSCULAR | Status: AC | PRN
  Administered 2019-05-15: 3 mL

## 2019-05-15 NOTE — Procedures (Signed)
Interventional Radiology Procedure Note  Procedure: US guided access of fistula/graft x 2.  Mechanical and pharm thrombectomy.  Treatment of venous outflow stenosis x 3.  1 at the stent edge, 1 proximal to the stent, 1 in the midgraft.  Edge stenosis required additional stent placement, 61m x 40 Proximal stenosis required 873mPTA then DEB Med stenosis was treated with 44m43mTA  Findings:  Restoration of flow and excellent thrill.   Complications: None  Recommendations:  - OK to use - 1 hr dc home - remove stay sutures at the next dialysis in 24-48 hrs  Signed,  JaiDulcy FannyagEarleen NewportO

## 2019-05-15 NOTE — H&P (Signed)
Chief Complaint: Patient was seen in consultation today for clotted dialysis access  Referring Physician(s): Fran Lowes  Supervising Physician: Corrie Mckusick  Patient Status: [facility] - Out-pt  History of Present Illness: Kara Knapp is a 48 y.o. female with past medical history of asthma, HTN, HLD, chronic kidney disease of dialysis via RUE AV graft. Patient presented for dialysis Tuesday, however was unable to undergo treatment due to clotted access.  This access has been intervened on twice in the past-- 08/08/18 and 12/26/18,with successful restoration of flow.  She presents to IR today for declotting procedure.   Patient assessed at bedside.  States she is sleepy, but otherwise feeling well.  Plans to go to dialysis tomorrow.  She has been NPO.   Past Medical History:  Diagnosis Date  . Anemia of chronic disease   . Asthma   . Blind left eye   . Bronchitis   . Cataract   . Cholecystitis, acute 05/26/2013   Status post cholecystectomy  . Chronic abdominal pain   . Chronic diarrhea   . Depression   . Diabetic foot ulcer ([place]) 03/01/2015  . Diastolic heart failure ([place])   . ESRD on hemodialysis ([place])    Started diaylsis 12/29/15  . Essential hypertension   . Fibroids   . Glaucoma   . History of blood transfusion   . History of pneumonia   . Hyperlipidemia   . Insulin-dependent diabetes mellitus with retinopathy   . Liver fibrosis    Negative Hep B surface antigen, negative Hep C antibody Feb 2018 (see scanned in labs).  . Neuropathy   . Osteomyelitis ([place])    Toe on left foot    Past Surgical History:  Procedure Laterality Date  . A/V SHUNTOGRAM N/A 10/25/2016   Procedure: A/V Shuntogram - Right Arm;  Surgeon: Waynetta Sandy, MD;  Location: [place] [facility];  Service: Cardiovascular;  Laterality: N/A;  . A/V SHUNTOGRAM N/A 03/05/2018   Procedure: A/V SHUNTOGRAM - Right Arm;  Surgeon: Waynetta Sandy, MD;  Location: [place] [facility];   Service: Cardiovascular;  Laterality: N/A;  . AV FISTULA PLACEMENT Right 10/17/2015   Procedure: INSERTION OF ARTERIOVENOUS GORE-TEX GRAFT RIGHT UPPER ARM WITH ACUSEAL;  Surgeon: Conrad Spry, MD;  Location: [place];  Service: Vascular;  Laterality: Right;  . CATARACT EXTRACTION W/ INTRAOCULAR LENS IMPLANT Bilateral   . CESAREAN SECTION    . CHOLECYSTECTOMY N/A 05/25/2013   Procedure: LAPAROSCOPIC CHOLECYSTECTOMY;  Surgeon: Jamesetta So, MD;  Location: [facility];  Service: General;  Laterality: N/A;  . COLONOSCOPY WITH PROPOFOL N/A 10/02/2016   two 3 to 5 mm polyps in the descending colon, three 2 to 3 mm polyps in the rectum, random colon biopsies, rectal bleeding due to internal hemorrhoids, friability with no bleeding at the anus status post biopsy.  Surgical pathology found the polyps to be a mix of hyperplastic and tubular adenoma, random colon biopsies to be benign colonic mucosa, and the anal biopsies to be anal skin tag.   Marland Kitchen ESOPHAGOGASTRODUODENOSCOPY (EGD) WITH PROPOFOL N/A 10/02/2016   mucosal nodule in the esophagus status post biopsy, moderate gastritis status post biopsy, mild duodenitis. esophageal biopsy to be benign, gastric biopsies to be gastritis due to aspirin use, and duodenal biopsies to be duodenitis due to aspirin use  . EYE SURGERY Bilateral   . IR THROMBECTOMY AV FISTULA W/THROMBOLYSIS/PTA INC/SHUNT/IMG RIGHT Right 12/26/2018  . IR THROMBECTOMY AV FISTULA W/THROMBOLYSIS/PTA/STENT INC/SHUNT/IMG RT Right 08/08/2018  . IR US  GUIDE VASC ACCESS RIGHT  08/08/2018  . IR US GUIDE VASC ACCESS RIGHT  12/26/2018  . PARS PLANA VITRECTOMY Left 11/24/2014   Procedure: PARS PLANA VITRECTOMY WITH 25 GAUGE;  Surgeon: Hurman Horn, MD;  Location: [place];  Service: Ophthalmology;  Laterality: Left;  . PERIPHERAL VASCULAR BALLOON ANGIOPLASTY Right 03/05/2018   Procedure: PERIPHERAL VASCULAR BALLOON ANGIOPLASTY;  Surgeon: Waynetta Sandy, MD;  Location: [place] [facility];  Service:  Cardiovascular;  Laterality: Right;  arm fistula  . PERIPHERAL VASCULAR CATHETERIZATION N/A 04/28/2015   Procedure: Bilateral Upper Extremity Venography;  Surgeon: Conrad [place]op[place], MD;  Location: [place] [facility];  Service: Cardiovascular;  Laterality: N/A;  . PHOTOCOAGULATION WITH LASER Left 11/24/2014   Procedure: PHOTOCOAGULATION WITH LASER;  Surgeon: Hurman Horn, MD;  Location: [place];  Service: Ophthalmology;  Laterality: Left;  with insertion of silicone oil  . SAVORY DILATION N/A 10/02/2016   Procedure: SAVORY DILATION;  Surgeon: Danie Binder, MD;  [facility];  Service: Endoscopy;  Laterality: N/A;  . TUBAL LIGATION      Allergies: Bactrim [sulfamethoxazole-trimethoprim] and Prednisone  Medications: Prior to Admission medications   Medication Sig Start Date End Date Taking? Authorizing Provider  azelastine (ASTELIN) 0.1 % nasal spray Place 1 spray into both nostrils 2 (two) times daily. Use in each nostril as directed Patient taking differently: 1 spray as needed. Use in each nostril as directed 08/29/18  Yes Dettinger, Fransisca Kaufmann, MD  brimonidine-timolol (COMBIGAN) 0.2-0.5 % ophthalmic solution Place 1 drop into both eyes daily.    Yes [provider]  cholecalciferol (VITAMIN D) 1000 units tablet Take 1,000 Units by mouth daily.    Yes [provider]  diclofenac sodium (VOLTAREN) 1 % GEL Apply 2 g topically 4 (four) times daily. Patient taking differently: Apply 2 g topically 4 (four) times daily as needed (pain).  11/14/18  Yes Gottschalk, Leatrice Jewels M, DO  gabapentin (NEURONTIN) 100 MG capsule Take 1 capsule (100 mg total) by mouth 3 (three) times daily. 03/18/19  Yes Dettinger, Fransisca Kaufmann, MD  Insulin Glargine (LANTUS SOLOSTAR) 100 UNIT/ML Solostar Pen Inject 18-50 Units into the skin at bedtime. Patient taking differently: Inject 20 Units into the skin at bedtime.  08/29/18  Yes Dettinger, Fransisca Kaufmann, MD  lidocaine-prilocaine (EMLA) cream Apply 1 application  topically daily as needed (arm port).   Yes [provider]  linagliptin (TRADJENTA) 5 MG TABS tablet Take 1 tablet (5 mg total) by mouth daily. 03/18/19  Yes Dettinger, Fransisca Kaufmann, MD  multivitamin (RENA-VIT) TABS tablet Take 1 tablet by mouth daily. 04/03/19  Yes [provider]  pantoprazole (PROTONIX) 40 MG tablet TAKE 1 BY MOUTH DAILY 30 minutes before breakfast Patient taking differently: Take 40 mg by mouth daily.  02/02/19  Yes Dettinger, Fransisca Kaufmann, MD  Probiotic Product (CULTURELLE PROBIOTICS PO) Take 1 capsule by mouth daily.    Yes [provider]  sertraline (ZOLOFT) 100 MG tablet Take 1.5 tablets (150 mg total) by mouth daily. Patient taking differently: Take 150 mg by mouth at bedtime.  06/15/19  Yes Hisada, Elie Goody, MD  sevelamer carbonate (RENVELA) 800 MG tablet Take 2,400 mg by mouth See admin instructions. Take 2462m by mouth three times daily with means and 8012mwith snacks   Yes [provider]  traZODone (DESYREL) 50 MG tablet Take 0.5-1 tablets (25-50 mg total) by mouth at bedtime as needed. for sleep Patient taking differently: Take 25 mg by mouth at bedtime  as needed for sleep.  03/18/19  Yes Dettinger, Fransisca Kaufmann, MD  Continuous Blood Gluc Sensor (FREESTYLE LIBRE 14 DAY SENSOR) MISC Inject 1 each into the skin every 14 (fourteen) days. Use as directed. 11/27/17   Cassandria Anger, MD  glucose blood (PRODIGY NO CODING BLOOD GLUC) test strip USE TO CHECK BLOOD SUGAR TWICE DAILY. Dx E11.22 02/03/19   Dettinger, Fransisca Kaufmann, MD  Insulin Pen Needle (TRUEPLUS PEN NEEDLES) 31G X 5 MM MISC Use daily with insulin Dx E11.22 03/09/19   Dettinger, Fransisca Kaufmann, MD  PRODIGY TWIST TOP LANCETS 28G MISC USE TO CHECK BLOOD SUGAR UP TO FOUR TIMES DAILY. 03/13/17   Timmothy Euler, MD  traMADol (ULTRAM) 50 MG tablet Take 1 tablet (50 mg total) by mouth every 8 (eight) hours as needed. Patient taking differently: Take 50 mg by mouth every 8 (eight) hours as needed for  moderate pain.  03/18/19   Dettinger, Fransisca Kaufmann, MD     Family History  Problem Relation Age of Onset  . COPD Mother   . Cancer Father   . Lymphoma Father   . Diabetes Sister   . Deep vein thrombosis Sister   . Diabetes Brother   . Hyperlipidemia Brother   . Hypertension Brother   . Mental retardation Sister   . Alcohol abuse Paternal Grandmother   . Colon cancer Neg Hx   . Liver disease Neg Hx     Social History   Socioeconomic History  . Marital status: Single    Spouse name: Not on file  . Number of children: 5  . Years of education: GED  . Highest education level: Not on file  Occupational History  . Not on file  Social Needs  . Financial resource strain: Not on file  . Food insecurity    Worry: Not on file    Inability: Not on file  . Transportation needs    Medical: Not on file    Non-medical: Not on file  Tobacco Use  . Smoking status: Current Every Day Smoker    Packs/day: 1.50    Years: 23.00    Pack years: 34.50    Types: Cigarettes  . Smokeless tobacco: Never Used  . Tobacco comment: one pack daily  Substance and Sexual Activity  . Alcohol use: No    Alcohol/week: 0.0 standard drinks    Frequency: Never  . Drug use: No    Comment: Sober for 8 years  . Sexual activity: Not Currently    Birth control/protection: Surgical  Lifestyle  . Physical activity    Days per week: Not on file    Minutes per session: Not on file  . Stress: Not on file  Relationships  . Social Herbalist on phone: Not on file    Gets together: Not on file    Attends religious service: Not on file    Active member of club or organization: Not on file    Attends meetings of clubs or organizations: Not on file    Relationship status: Not on file  Other Topics Concern  . Not on file  Social History Narrative  . Not on file     Review of Systems: A 12 point ROS discussed and pertinent positives are indicated in the HPI above.  All other systems are negative.   Review of Systems  Constitutional: Negative for fatigue and fever.  Respiratory: Negative for cough and shortness of breath.   Cardiovascular: Negative for chest  pain.  Gastrointestinal: Negative for abdominal pain.  Musculoskeletal: Negative for back pain.  Psychiatric/Behavioral: Negative for behavioral problems and confusion.    Vital Signs: BP (!) 215/97   Pulse 71   Temp 98.3 F (36.8 C) (Oral)   Resp 18   Ht 5' 3"  (1.6 m)   Wt 179 lb (81.2 kg)   LMP 05/05/2015   SpO2 100%   BMI 31.71 kg/m   Physical Exam Vitals signs and nursing note reviewed.  Constitutional:      Appearance: Normal appearance.  Neck:     Musculoskeletal: Normal range of motion and neck supple.  Cardiovascular:     Rate and Rhythm: Normal rate and regular rhythm.  Pulmonary:     Effort: Pulmonary effort is normal. No respiratory distress.     Breath sounds: Normal breath sounds.  Abdominal:     General: Abdomen is flat.     Palpations: Abdomen is soft.  Musculoskeletal:     Comments: RUE graft without bruit or thrill.  Distal palpable pulse, otherwise stagnant.   Skin:    General: Skin is warm and dry.  Neurological:     General: No focal deficit present.     Mental Status: She is alert and oriented to person, place, and time. Mental status is at baseline.  Psychiatric:        Mood and Affect: Mood normal.        Behavior: Behavior normal.        Thought Content: Thought content normal.        Judgment: Judgment normal.      MD Evaluation Airway: WNL Heart: WNL Abdomen: WNL Chest/ Lungs: WNL ASA  Classification: 3 Mallampati/Airway Score: Two   Imaging: US Abdomen Complete  Result Date: 04/24/2019 CLINICAL DATA:  Elevated liver function tests. History of a prior cholecystectomy. EXAM: ABDOMEN ULTRASOUND COMPLETE COMPARISON:  Abdomen ultrasound, 03/15/2017. FINDINGS: Gallbladder: Surgically absent. Common bile duct: Diameter: 7 mm Liver: Subtle surface nodularity. Somewhat  coarsened echotexture with borderline increased parenchymal echogenicity. No mass or focal lesion. Portal vein is patent on color Doppler imaging with normal direction of blood flow towards the liver. IVC: No abnormality visualized. Pancreas: Visualized portion unremarkable. Spleen: Top normal in size measuring 10.7 cm. No mass or suspicious lesion. Calcification consistent with a healed granuloma. Right Kidney: Length: 8.9 cm. Increased parenchymal echogenicity. Diffuse cortical thinning. No mass, stone or hydronephrosis. Left Kidney: Length: 9.2 cm. There is increased parenchymal echogenicity. Diffuse cortical thinning. No mass, stone or hydronephrosis. Abdominal aorta: No aneurysm visualized. Other findings: None. IMPRESSION: 1. No acute findings. 2. Liver shows mildly coarsened parenchymal echotexture, borderline increased parenchymal echogenicity and subtle surface nodularity. Consider cirrhosis in the proper clinical setting which could be further assessed with liver MRI if desired clinically. No liver mass or focal lesion. 3. Status post cholecystectomy. 4. Medical renal disease with atrophic kidneys with increased renal parenchymal echogenicity. No hydronephrosis. Electronically Signed   By: Lajean Manes M.D.   On: 04/24/2019 14:31   Dg Foot 2 Views Left  Result Date: 04/27/2019 Please see detailed radiograph report in office note.   Labs:  CBC: Recent Labs    10/01/18 1115 05/04/19 1145 05/08/19 1423 05/15/19 1215  WBC 8.6 8.0 7.1 7.2  HGB 10.9* 10.6* 10.6* 11.0*  HCT 34.5* 33.5* 31.5* 34.6*  PLT 206 186 169 189    COAGS: Recent Labs    08/08/18 0812 05/08/19 1423 05/15/19 1215  INR 0.96 0.9 1.0    BMP: Recent  Labs    08/29/18 1406 10/01/18 1115 12/26/18 1157 05/15/19 1348  NA 136 137 137 135  K 4.6 4.7 4.8 5.9*  CL 93* 98 99 99  CO2 22 25 24 22   GLUCOSE 162* 139* 142* 108*  BUN 40* 27* 53* 54*  CALCIUM 9.0 8.6* 9.2 9.0  CREATININE 5.99* 5.37* 7.36* 8.77*   GFRNONAA 8* 9* 6* 5*  GFRAA 9* 10* 7* 6*    LIVER FUNCTION TESTS: Recent Labs    08/29/18 1406 10/01/18 1115 05/08/19 1423  BILITOT 0.3 0.3 0.3  AST 8 17 15   ALT 12 15 13   ALKPHOS 289* 276*  --   PROT 7.2 8.1 7.0  ALBUMIN 4.2 4.1  --     TUMOR MARKERS: No results for input(s): AFPTM, CEA, CA199, CHROMGRNA in the last 8760 hours.  Assessment and Plan: Patient with past medical history of end stage renal failure on HD via RUE AV graft presents with complaint of clotted access.  IR consulted for declotting procedure at the request of Dr. Hinda Lenis. Case reviewed by Dr. Earleen Newport who approves patient for procedure.  Patient presents today in their usual state of health.  She has been NPO and is not currently on blood thinners.  Potassium 5.9.  Ok to proceed.  Patient to undergo dialysis tomorrow at her regularly scheduled appointment.    Risks and benefits discussed with the patient including, but not limited to bleeding, infection, vascular injury, pulmonary embolism, need for tunneled HD catheter placement or even death.  All of the patient's questions were answered, patient is agreeable to proceed. Consent signed and in chart.  Thank you for this interesting consult.  I greatly enjoyed meeting Kara Knapp and look forward to participating in their care.  A copy of this report was sent to the requesting provider on this date.  Electronically Signed: Docia Barrier, PA 05/15/2019, 2:49 PM   I spent a total of  30 Minutes   in face to face in clinical consultation, greater than 50% of which was counseling/coordinating care for renal failure, RUE clotted AV graft.

## 2019-05-16 DIAGNOSIS — D631 Anemia in chronic kidney disease: Secondary | ICD-10-CM | POA: Diagnosis not present

## 2019-05-16 DIAGNOSIS — Z23 Encounter for immunization: Secondary | ICD-10-CM | POA: Diagnosis not present

## 2019-05-16 DIAGNOSIS — Z992 Dependence on renal dialysis: Secondary | ICD-10-CM | POA: Diagnosis not present

## 2019-05-16 DIAGNOSIS — N186 End stage renal disease: Secondary | ICD-10-CM | POA: Diagnosis not present

## 2019-05-16 DIAGNOSIS — N250 Renal osteodystrophy: Secondary | ICD-10-CM | POA: Diagnosis not present

## 2019-05-19 DIAGNOSIS — N250 Renal osteodystrophy: Secondary | ICD-10-CM | POA: Diagnosis not present

## 2019-05-19 DIAGNOSIS — D631 Anemia in chronic kidney disease: Secondary | ICD-10-CM | POA: Diagnosis not present

## 2019-05-19 DIAGNOSIS — Z23 Encounter for immunization: Secondary | ICD-10-CM | POA: Diagnosis not present

## 2019-05-19 DIAGNOSIS — N186 End stage renal disease: Secondary | ICD-10-CM | POA: Diagnosis not present

## 2019-05-19 DIAGNOSIS — Z992 Dependence on renal dialysis: Secondary | ICD-10-CM | POA: Diagnosis not present

## 2019-05-19 NOTE — Progress Notes (Signed)
Hgb stable. Alk Phos 180, normal transaminases. INR normal. Unable to calculate MELD as I didn't order BMP. Stable overall.

## 2019-05-20 ENCOUNTER — Ambulatory Visit (INDEPENDENT_AMBULATORY_CARE_PROVIDER_SITE_OTHER): Payer: Medicaid Other | Admitting: Licensed Clinical Social Worker

## 2019-05-20 ENCOUNTER — Encounter (HOSPITAL_COMMUNITY): Payer: Self-pay | Admitting: Licensed Clinical Social Worker

## 2019-05-20 ENCOUNTER — Other Ambulatory Visit: Payer: Self-pay

## 2019-05-20 DIAGNOSIS — F3341 Major depressive disorder, recurrent, in partial remission: Secondary | ICD-10-CM

## 2019-05-20 NOTE — Progress Notes (Signed)
Virtual Visit via Video Note  I connected with Kara Knapp on 05/20/19 at  1:00 PM EDT by a video enabled telemedicine application and verified that I am speaking with the correct person using two identifiers.  Location: Patient: Home Provider: Office   I discussed the limitations of evaluation and management by telemedicine and the availability of in person appointments. The patient expressed understanding and agreed to proceed.  Participation Level: Active  Behavioral Response: CasualAlertDepressed  Type of Therapy: Individual Therapy  Treatment Goals addressed: Coping  Interventions: CBT and Solution Focused  Summary: Kara Knapp is a 48 y.o. female who presents oriented x5 (person, place, situation, time, and object), casually dressed, appropriately groomed, average height, overweight, and cooperative to address mood. Patient has a history of medical treatment including dialysis. Patient has a minimal history of mental health treatment including medication management. Patient denies suicidal and homicidal ideations. Patient denies psychosis including auditory and visual hallucinations. Patient denies substance abuse. Patient is at low risk for lethality at this time.  Physically: Patient broke her heel bone. She is in a boot. Patient found out that she is eligible for a kidney and liver transplant. She is going to pursue it. Patient is doing her dialysis regularly.   Spiritually/values: Patient is spiritually healthy.  Relationships: Patient is getting along with her family. She spent time with her son the previous day and enjoyed it. Patient feels like she is open with her family about her past.  Emotionally/Mentally/Behavior: Patient is managing mood. Patient denies depression and anxiety. She shared experiences of being a young mother, her past substance abuse, and her health issues. Patient recognized that she is able to get through difficult times and can be a strong  person. Patient is focusing on what she can do such as wash the dishes. Patient is focusing on the good things that are occurring in her life.    Patient engaged in session. She responded well to interventions. Patient continues to meet criteria for Major depressive disorder, recurrent episode, mild. Patient will continue in outpatient therapy due to being the least restrictive service to meet her needs. Patient made minimal progress on her goals.   Suicidal/Homicidal: Negativewithout intent/plan  Therapist Response: Therapist reviewed patient's recent thoughts and behaviors. Therapist utilized CBT to address mood. Therapist processed patient's recent thoughts to identify triggers for mood. Therapist discussed with patient what is going well and the resources she has to manage her mood.   Plan: Return again in 2-3 weeks.  Diagnosis: Axis I: Major depressive disorder, recurrent episode, mild    Axis II: No diagnosis  I discussed the assessment and treatment plan with the patient. The patient was provided an opportunity to ask questions and all were answered. The patient agreed with the plan and demonstrated an understanding of the instructions.   The patient was advised to call back or seek an in-person evaluation if the symptoms worsen or if the condition fails to improve as anticipated.  I provided 40 minutes of non-face-to-face time during this encounter.   Glori Bickers, LCSW 05/20/2019

## 2019-05-21 DIAGNOSIS — N250 Renal osteodystrophy: Secondary | ICD-10-CM | POA: Diagnosis not present

## 2019-05-21 DIAGNOSIS — N186 End stage renal disease: Secondary | ICD-10-CM | POA: Diagnosis not present

## 2019-05-21 DIAGNOSIS — Z992 Dependence on renal dialysis: Secondary | ICD-10-CM | POA: Diagnosis not present

## 2019-05-21 DIAGNOSIS — Z23 Encounter for immunization: Secondary | ICD-10-CM | POA: Diagnosis not present

## 2019-05-21 DIAGNOSIS — D631 Anemia in chronic kidney disease: Secondary | ICD-10-CM | POA: Diagnosis not present

## 2019-05-22 ENCOUNTER — Ambulatory Visit: Payer: Medicare Other | Admitting: Podiatry

## 2019-05-22 DIAGNOSIS — Z23 Encounter for immunization: Secondary | ICD-10-CM | POA: Diagnosis not present

## 2019-05-22 DIAGNOSIS — Z992 Dependence on renal dialysis: Secondary | ICD-10-CM | POA: Diagnosis not present

## 2019-05-22 DIAGNOSIS — N186 End stage renal disease: Secondary | ICD-10-CM | POA: Diagnosis not present

## 2019-05-22 DIAGNOSIS — D631 Anemia in chronic kidney disease: Secondary | ICD-10-CM | POA: Diagnosis not present

## 2019-05-22 DIAGNOSIS — N250 Renal osteodystrophy: Secondary | ICD-10-CM | POA: Diagnosis not present

## 2019-05-25 ENCOUNTER — Ambulatory Visit: Payer: Medicare Other | Admitting: Podiatry

## 2019-05-25 DIAGNOSIS — N186 End stage renal disease: Secondary | ICD-10-CM | POA: Diagnosis not present

## 2019-05-25 DIAGNOSIS — Z23 Encounter for immunization: Secondary | ICD-10-CM | POA: Diagnosis not present

## 2019-05-25 DIAGNOSIS — Z992 Dependence on renal dialysis: Secondary | ICD-10-CM | POA: Diagnosis not present

## 2019-05-25 DIAGNOSIS — D631 Anemia in chronic kidney disease: Secondary | ICD-10-CM | POA: Diagnosis not present

## 2019-05-25 DIAGNOSIS — N250 Renal osteodystrophy: Secondary | ICD-10-CM | POA: Diagnosis not present

## 2019-05-27 ENCOUNTER — Ambulatory Visit (INDEPENDENT_AMBULATORY_CARE_PROVIDER_SITE_OTHER): Payer: Medicare Other | Admitting: Urology

## 2019-05-27 DIAGNOSIS — D3002 Benign neoplasm of left kidney: Secondary | ICD-10-CM

## 2019-05-28 DIAGNOSIS — Z23 Encounter for immunization: Secondary | ICD-10-CM | POA: Diagnosis not present

## 2019-05-28 DIAGNOSIS — D631 Anemia in chronic kidney disease: Secondary | ICD-10-CM | POA: Diagnosis not present

## 2019-05-28 DIAGNOSIS — N250 Renal osteodystrophy: Secondary | ICD-10-CM | POA: Diagnosis not present

## 2019-05-28 DIAGNOSIS — Z992 Dependence on renal dialysis: Secondary | ICD-10-CM | POA: Diagnosis not present

## 2019-05-28 DIAGNOSIS — N186 End stage renal disease: Secondary | ICD-10-CM | POA: Diagnosis not present

## 2019-05-30 DIAGNOSIS — Z992 Dependence on renal dialysis: Secondary | ICD-10-CM | POA: Diagnosis not present

## 2019-05-30 DIAGNOSIS — Z23 Encounter for immunization: Secondary | ICD-10-CM | POA: Diagnosis not present

## 2019-05-30 DIAGNOSIS — D631 Anemia in chronic kidney disease: Secondary | ICD-10-CM | POA: Diagnosis not present

## 2019-05-30 DIAGNOSIS — N186 End stage renal disease: Secondary | ICD-10-CM | POA: Diagnosis not present

## 2019-05-30 DIAGNOSIS — N250 Renal osteodystrophy: Secondary | ICD-10-CM | POA: Diagnosis not present

## 2019-06-02 DIAGNOSIS — D631 Anemia in chronic kidney disease: Secondary | ICD-10-CM | POA: Diagnosis not present

## 2019-06-02 DIAGNOSIS — Z992 Dependence on renal dialysis: Secondary | ICD-10-CM | POA: Diagnosis not present

## 2019-06-02 DIAGNOSIS — N250 Renal osteodystrophy: Secondary | ICD-10-CM | POA: Diagnosis not present

## 2019-06-02 DIAGNOSIS — N186 End stage renal disease: Secondary | ICD-10-CM | POA: Diagnosis not present

## 2019-06-02 DIAGNOSIS — Z23 Encounter for immunization: Secondary | ICD-10-CM | POA: Diagnosis not present

## 2019-06-03 ENCOUNTER — Ambulatory Visit: Payer: Medicare Other | Admitting: Podiatry

## 2019-06-06 DIAGNOSIS — D631 Anemia in chronic kidney disease: Secondary | ICD-10-CM | POA: Diagnosis not present

## 2019-06-06 DIAGNOSIS — Z992 Dependence on renal dialysis: Secondary | ICD-10-CM | POA: Diagnosis not present

## 2019-06-06 DIAGNOSIS — N250 Renal osteodystrophy: Secondary | ICD-10-CM | POA: Diagnosis not present

## 2019-06-06 DIAGNOSIS — N186 End stage renal disease: Secondary | ICD-10-CM | POA: Diagnosis not present

## 2019-06-06 DIAGNOSIS — Z23 Encounter for immunization: Secondary | ICD-10-CM | POA: Diagnosis not present

## 2019-06-08 ENCOUNTER — Other Ambulatory Visit: Payer: Self-pay

## 2019-06-08 DIAGNOSIS — I120 Hypertensive chronic kidney disease with stage 5 chronic kidney disease or end stage renal disease: Secondary | ICD-10-CM | POA: Diagnosis not present

## 2019-06-08 DIAGNOSIS — Z992 Dependence on renal dialysis: Secondary | ICD-10-CM | POA: Diagnosis not present

## 2019-06-08 DIAGNOSIS — Z79899 Other long term (current) drug therapy: Secondary | ICD-10-CM | POA: Diagnosis not present

## 2019-06-08 DIAGNOSIS — N186 End stage renal disease: Secondary | ICD-10-CM | POA: Diagnosis not present

## 2019-06-08 DIAGNOSIS — Z8614 Personal history of Methicillin resistant Staphylococcus aureus infection: Secondary | ICD-10-CM | POA: Diagnosis not present

## 2019-06-08 DIAGNOSIS — L02412 Cutaneous abscess of left axilla: Secondary | ICD-10-CM | POA: Diagnosis not present

## 2019-06-08 DIAGNOSIS — Z9049 Acquired absence of other specified parts of digestive tract: Secondary | ICD-10-CM | POA: Diagnosis not present

## 2019-06-08 DIAGNOSIS — E1122 Type 2 diabetes mellitus with diabetic chronic kidney disease: Secondary | ICD-10-CM | POA: Diagnosis not present

## 2019-06-08 DIAGNOSIS — E1140 Type 2 diabetes mellitus with diabetic neuropathy, unspecified: Secondary | ICD-10-CM | POA: Diagnosis not present

## 2019-06-08 DIAGNOSIS — Z9851 Tubal ligation status: Secondary | ICD-10-CM | POA: Diagnosis not present

## 2019-06-08 DIAGNOSIS — F17200 Nicotine dependence, unspecified, uncomplicated: Secondary | ICD-10-CM | POA: Diagnosis not present

## 2019-06-08 DIAGNOSIS — Z794 Long term (current) use of insulin: Secondary | ICD-10-CM | POA: Diagnosis not present

## 2019-06-08 DIAGNOSIS — Z882 Allergy status to sulfonamides status: Secondary | ICD-10-CM | POA: Diagnosis not present

## 2019-06-08 DIAGNOSIS — Z888 Allergy status to other drugs, medicaments and biological substances status: Secondary | ICD-10-CM | POA: Diagnosis not present

## 2019-06-09 DIAGNOSIS — Z992 Dependence on renal dialysis: Secondary | ICD-10-CM | POA: Diagnosis not present

## 2019-06-09 DIAGNOSIS — Z23 Encounter for immunization: Secondary | ICD-10-CM | POA: Diagnosis not present

## 2019-06-09 DIAGNOSIS — D631 Anemia in chronic kidney disease: Secondary | ICD-10-CM | POA: Diagnosis not present

## 2019-06-09 DIAGNOSIS — N186 End stage renal disease: Secondary | ICD-10-CM | POA: Diagnosis not present

## 2019-06-09 DIAGNOSIS — N250 Renal osteodystrophy: Secondary | ICD-10-CM | POA: Diagnosis not present

## 2019-06-11 DIAGNOSIS — Z992 Dependence on renal dialysis: Secondary | ICD-10-CM | POA: Diagnosis not present

## 2019-06-11 DIAGNOSIS — D631 Anemia in chronic kidney disease: Secondary | ICD-10-CM | POA: Diagnosis not present

## 2019-06-11 DIAGNOSIS — N250 Renal osteodystrophy: Secondary | ICD-10-CM | POA: Diagnosis not present

## 2019-06-11 DIAGNOSIS — Z23 Encounter for immunization: Secondary | ICD-10-CM | POA: Diagnosis not present

## 2019-06-11 DIAGNOSIS — N186 End stage renal disease: Secondary | ICD-10-CM | POA: Diagnosis not present

## 2019-06-12 ENCOUNTER — Telehealth: Payer: Self-pay | Admitting: Family Medicine

## 2019-06-12 ENCOUNTER — Ambulatory Visit (INDEPENDENT_AMBULATORY_CARE_PROVIDER_SITE_OTHER): Payer: Medicare Other | Admitting: Family Medicine

## 2019-06-12 ENCOUNTER — Encounter: Payer: Self-pay | Admitting: Family Medicine

## 2019-06-12 ENCOUNTER — Other Ambulatory Visit: Payer: Self-pay

## 2019-06-12 VITALS — BP 162/90 | HR 81 | Temp 98.0°F | Resp 20 | Ht 63.0 in | Wt 190.0 lb

## 2019-06-12 DIAGNOSIS — L02412 Cutaneous abscess of left axilla: Secondary | ICD-10-CM | POA: Diagnosis not present

## 2019-06-12 MED ORDER — DOXYCYCLINE HYCLATE 100 MG PO TABS: 100.0000 mg | ORAL_TABLET | Freq: Two times a day (BID) | ORAL | 0 refills | Status: DC

## 2019-06-12 NOTE — Patient Instructions (Signed)
If you develop fever, chills, confusion, or weakness you need to to to the emergency department for evaluation.    Skin Abscess  A skin abscess is an infected area on or under your skin that contains a collection of pus and other material. An abscess may also be called a furuncle, carbuncle, or boil. An abscess can occur in or on almost any part of your body. Some abscesses break open (rupture) on their own. Most continue to get worse unless they are treated. The infection can spread deeper into the body and eventually into your blood, which can make you feel ill. Treatment usually involves draining the abscess. What are the causes? An abscess occurs when germs, like bacteria, pass through your skin and cause an infection. This may be caused by:  A scrape or cut on your skin.  A puncture wound through your skin, including a needle injection or insect bite.  Blocked oil or sweat glands.  Blocked and infected hair follicles.  A cyst that forms beneath your skin (sebaceous cyst) and becomes infected. What increases the risk? This condition is more likely to develop in people who:  Have a weak body defense system (immune system).  Have diabetes.  Have dry and irritated skin.  Get frequent injections or use illegal IV drugs.  Have a foreign body in a wound, such as a splinter.  Have problems with their lymph system or veins. What are the signs or symptoms? Symptoms of this condition include:  A painful, firm bump under the skin.  A bump with pus at the top. This may break through the skin and drain. Other symptoms include:  Redness surrounding the abscess site.  Warmth.  Swelling of the lymph nodes (glands) near the abscess.  Tenderness.  A sore on the skin. How is this diagnosed? This condition may be diagnosed based on:  A physical exam.  Your medical history.  A sample of pus. This may be used to find out what is causing the infection.  Blood tests.  Imaging  tests, such as an ultrasound, CT scan, or MRI. How is this treated? A small abscess that drains on its own may not need treatment. Treatment for larger abscesses may include:  Moist heat or heat pack applied to the area several times a day.  A procedure to drain the abscess (incision and drainage).  Antibiotic medicines. For a severe abscess, you may first get antibiotics through an IV and then change to antibiotics by mouth. Follow these instructions at home: Medicines   Take over-the-counter and prescription medicines only as told by your health care provider.  If you were prescribed an antibiotic medicine, take it as told by your health care provider. Do not stop taking the antibiotic even if you start to feel better. Abscess care   If you have an abscess that has not drained, apply heat to the affected area. Use the heat source that your health care provider recommends, such as a moist heat pack or a heating pad. ? Place a towel between your skin and the heat source. ? Leave the heat on for 20-30 minutes. ? Remove the heat if your skin turns bright red. This is especially important if you are unable to feel pain, heat, or cold. You may have a greater risk of getting burned.  Follow instructions from your health care provider about how to take care of your abscess. Make sure you: ? Cover the abscess with a bandage (dressing). ? Change your dressing  or gauze as told by your health care provider. ? Wash your hands with soap and water before you change the dressing or gauze. If soap and water are not available, use hand sanitizer.  Check your abscess every day for signs of a worsening infection. Check for: ? More redness, swelling, or pain. ? More fluid or blood. ? Warmth. ? More pus or a bad smell. General instructions  To avoid spreading the infection: ? Do not share personal care items, towels, or hot tubs with others. ? Avoid making skin contact with other people.  Keep  all follow-up visits as told by your health care provider. This is important. Contact a health care provider if you have:  More redness, swelling, or pain around your abscess.  More fluid or blood coming from your abscess.  Warm skin around your abscess.  More pus or a bad smell coming from your abscess.  A fever.  Muscle aches.  Chills or a general ill feeling. Get help right away if you:  Have severe pain.  See red streaks on your skin spreading away from the abscess. Summary  A skin abscess is an infected area on or under your skin that contains a collection of pus and other material.  A small abscess that drains on its own may not need treatment.  Treatment for larger abscesses may include having a procedure to drain the abscess and taking an antibiotic. This information is not intended to replace advice given to you by your health care provider. Make sure you discuss any questions you have with your health care provider. Document Released: 04/25/2005 Document Revised: 11/06/2018 Document Reviewed: 08/29/2017 Elsevier Patient Education  2020 [employer].

## 2019-06-13 DIAGNOSIS — I120 Hypertensive chronic kidney disease with stage 5 chronic kidney disease or end stage renal disease: Secondary | ICD-10-CM | POA: Diagnosis not present

## 2019-06-13 DIAGNOSIS — Z8614 Personal history of Methicillin resistant Staphylococcus aureus infection: Secondary | ICD-10-CM | POA: Diagnosis not present

## 2019-06-13 DIAGNOSIS — Z882 Allergy status to sulfonamides status: Secondary | ICD-10-CM | POA: Diagnosis not present

## 2019-06-13 DIAGNOSIS — L02412 Cutaneous abscess of left axilla: Secondary | ICD-10-CM | POA: Diagnosis not present

## 2019-06-13 DIAGNOSIS — E1040 Type 1 diabetes mellitus with diabetic neuropathy, unspecified: Secondary | ICD-10-CM | POA: Diagnosis not present

## 2019-06-13 DIAGNOSIS — L02414 Cutaneous abscess of left upper limb: Secondary | ICD-10-CM | POA: Diagnosis not present

## 2019-06-13 DIAGNOSIS — Z79899 Other long term (current) drug therapy: Secondary | ICD-10-CM | POA: Diagnosis not present

## 2019-06-13 DIAGNOSIS — F17210 Nicotine dependence, cigarettes, uncomplicated: Secondary | ICD-10-CM | POA: Diagnosis not present

## 2019-06-13 DIAGNOSIS — Z792 Long term (current) use of antibiotics: Secondary | ICD-10-CM | POA: Diagnosis not present

## 2019-06-13 DIAGNOSIS — D631 Anemia in chronic kidney disease: Secondary | ICD-10-CM | POA: Diagnosis not present

## 2019-06-13 DIAGNOSIS — N250 Renal osteodystrophy: Secondary | ICD-10-CM | POA: Diagnosis not present

## 2019-06-13 DIAGNOSIS — Z23 Encounter for immunization: Secondary | ICD-10-CM | POA: Diagnosis not present

## 2019-06-13 DIAGNOSIS — N186 End stage renal disease: Secondary | ICD-10-CM | POA: Diagnosis not present

## 2019-06-13 DIAGNOSIS — E1022 Type 1 diabetes mellitus with diabetic chronic kidney disease: Secondary | ICD-10-CM | POA: Diagnosis not present

## 2019-06-13 DIAGNOSIS — Z992 Dependence on renal dialysis: Secondary | ICD-10-CM | POA: Diagnosis not present

## 2019-06-13 NOTE — Progress Notes (Signed)
Subjective:  Patient ID: Kara Knapp, female    DOB: Jul 24, 1971, 48 y.o.   MRN: [id number]  Patient Care Team: Dettinger, Fransisca Kaufmann, MD as PCP - General (Family Medicine) Satira Sark, MD as PCP - Cardiology (Cardiology) Fran Lowes, MD as Consulting Physician (Nephrology) Timmothy Euler, MD (Inactive) as Attending Physician (Family Medicine) Danie Binder, MD as Consulting Physician (Gastroenterology)   Chief Complaint:  left arm abscess (follow up from [facility])   HPI: Kara Knapp is a 48 y.o. female presenting on 06/12/2019 for left arm abscess (follow up from [facility])   Pt presents today for a wound recheck. Pt has an abscess to her left axilla that was lanced on 06/08/2019 at [facility] ED in [place], [place]. Pt states she has had increased pain, swelling, and erythema since this ED visit. Pt denies fever or chills. Pt ha a history of DM, end stage renal disease on dialysis, and hypertension. Pt reports she has had to have surgery in the past due to the extensive nature of the previous abscess.   Wound Check She was originally treated 3 to 5 days ago. Previous treatment included I&D of abscess and oral antibiotics. Her temperature was unmeasured prior to arrival. There has been bloody and colored discharge from the wound. There is new redness present. The swelling has worsened. The pain has worsened. There is difficulty moving the extremity or digit due to pain.     Relevant past medical, surgical, family, and social history reviewed and updated as indicated.  Allergies and medications reviewed and updated. Date reviewed: Chart in Epic.   Past Medical History:  Diagnosis Date  . Anemia of chronic disease   . Asthma   . Blind left eye   . Bronchitis   . Cataract   . Cholecystitis, acute 05/26/2013   Status post cholecystectomy  . Chronic abdominal pain   . Chronic diarrhea   . Depression   . Diabetic foot ulcer ([place]) 03/01/2015  .  Diastolic heart failure ([place])   . ESRD on hemodialysis ([place])    Started diaylsis 12/29/15  . Essential hypertension   . Fibroids   . Glaucoma   . History of blood transfusion   . History of pneumonia   . Hyperlipidemia   . Insulin-dependent diabetes mellitus with retinopathy   . Liver fibrosis    Negative Hep B surface antigen, negative Hep C antibody Feb 2018 (see scanned in labs).  . Neuropathy   . Osteomyelitis ([place])    Toe on left foot    Past Surgical History:  Procedure Laterality Date  . A/V SHUNTOGRAM N/A 10/25/2016   Procedure: A/V Shuntogram - Right Arm;  Surgeon: Waynetta Sandy, MD;  Location: [place] [facility];  Service: Cardiovascular;  Laterality: N/A;  . A/V SHUNTOGRAM N/A 03/05/2018   Procedure: A/V SHUNTOGRAM - Right Arm;  Surgeon: Waynetta Sandy, MD;  Location: [place] [facility];  Service: Cardiovascular;  Laterality: N/A;  . AV FISTULA PLACEMENT Right 10/17/2015   Procedure: INSERTION OF ARTERIOVENOUS GORE-TEX GRAFT RIGHT UPPER ARM WITH ACUSEAL;  Surgeon: Conrad Ewing, MD;  Location: [place];  Service: Vascular;  Laterality: Right;  . CATARACT EXTRACTION W/ INTRAOCULAR LENS IMPLANT Bilateral   . CESAREAN SECTION    . CHOLECYSTECTOMY N/A 05/25/2013   Procedure: LAPAROSCOPIC CHOLECYSTECTOMY;  Surgeon: Jamesetta So, MD;  Location: [facility];  Service: General;  Laterality: N/A;  . COLONOSCOPY WITH PROPOFOL N/A 10/02/2016  two 3 to 5 mm polyps in the descending colon, three 2 to 3 mm polyps in the rectum, random colon biopsies, rectal bleeding due to internal hemorrhoids, friability with no bleeding at the anus status post biopsy.  Surgical pathology found the polyps to be a mix of hyperplastic and tubular adenoma, random colon biopsies to be benign colonic mucosa, and the anal biopsies to be anal skin tag.   Marland Kitchen ESOPHAGOGASTRODUODENOSCOPY (EGD) WITH PROPOFOL N/A 10/02/2016   mucosal nodule in the esophagus status post biopsy, moderate gastritis status post  biopsy, mild duodenitis. esophageal biopsy to be benign, gastric biopsies to be gastritis due to aspirin use, and duodenal biopsies to be duodenitis due to aspirin use  . EYE SURGERY Bilateral   . IR THROMBECTOMY AV FISTULA W/THROMBOLYSIS/PTA INC/SHUNT/IMG RIGHT Right 12/26/2018  . IR THROMBECTOMY AV FISTULA W/THROMBOLYSIS/PTA/STENT INC/SHUNT/IMG RT Right 08/08/2018  . IR THROMBECTOMY AV FISTULA W/THROMBOLYSIS/PTA/STENT INC/SHUNT/IMG RT Right 05/15/2019  . IR US GUIDE VASC ACCESS RIGHT  08/08/2018  . IR US GUIDE VASC ACCESS RIGHT  12/26/2018  . IR US GUIDE VASC ACCESS RIGHT  05/15/2019  . PARS PLANA VITRECTOMY Left 11/24/2014   Procedure: PARS PLANA VITRECTOMY WITH 25 GAUGE;  Surgeon: Hurman Horn, MD;  Location: [place];  Service: Ophthalmology;  Laterality: Left;  . PERIPHERAL VASCULAR BALLOON ANGIOPLASTY Right 03/05/2018   Procedure: PERIPHERAL VASCULAR BALLOON ANGIOPLASTY;  Surgeon: Waynetta Sandy, MD;  Location: [place] [facility];  Service: Cardiovascular;  Laterality: Right;  arm fistula  . PERIPHERAL VASCULAR CATHETERIZATION N/A 04/28/2015   Procedure: Bilateral Upper Extremity Venography;  Surgeon: Conrad Nipinnawasee, MD;  Location: [place] [facility];  Service: Cardiovascular;  Laterality: N/A;  . PHOTOCOAGULATION WITH LASER Left 11/24/2014   Procedure: PHOTOCOAGULATION WITH LASER;  Surgeon: Hurman Horn, MD;  Location: [place];  Service: Ophthalmology;  Laterality: Left;  with insertion of silicone oil  . SAVORY DILATION N/A 10/02/2016   Procedure: SAVORY DILATION;  Surgeon: Danie Binder, MD;  [facility];  Service: Endoscopy;  Laterality: N/A;  . TUBAL LIGATION      Social History   Socioeconomic History  . Marital status: Single    Spouse name: Not on file  . Number of children: 5  . Years of education: GED  . Highest education level: Not on file  Occupational History  . Not on file  Social Needs  . Financial resource strain: Not on file  . Food insecurity     Worry: Not on file    Inability: Not on file  . Transportation needs    Medical: Not on file    Non-medical: Not on file  Tobacco Use  . Smoking status: Current Every Day Smoker    Packs/day: 1.50    Years: 23.00    Pack years: 34.50    Types: Cigarettes  . Smokeless tobacco: Never Used  . Tobacco comment: one pack daily  Substance and Sexual Activity  . Alcohol use: No    Alcohol/week: 0.0 standard drinks    Frequency: Never  . Drug use: No    Comment: Sober for 8 years  . Sexual activity: Not Currently    Birth control/protection: Surgical  Lifestyle  . Physical activity    Days per week: Not on file    Minutes per session: Not on file  . Stress: Not on file  Relationships  . Social Herbalist on phone: Not on file    Gets together: Not  on file    Attends religious service: Not on file    Active member of club or organization: Not on file    Attends meetings of clubs or organizations: Not on file    Relationship status: Not on file  . Intimate partner violence    Fear of current or ex partner: Not on file    Emotionally abused: Not on file    Physically abused: Not on file    Forced sexual activity: Not on file  Other Topics Concern  . Not on file  Social History Narrative  . Not on file    Outpatient Encounter Medications as of 06/12/2019  Medication Sig  . azelastine (ASTELIN) 0.1 % nasal spray Place 1 spray into both nostrils 2 (two) times daily. Use in each nostril as directed (Patient taking differently: 1 spray as needed. Use in each nostril as directed)  . brimonidine-timolol (COMBIGAN) 0.2-0.5 % ophthalmic solution Place 1 drop into both eyes daily.   . cholecalciferol (VITAMIN D) 1000 units tablet Take 1,000 Units by mouth daily.   . clindamycin (CLEOCIN) 300 MG capsule   . Continuous Blood Gluc Sensor (FREESTYLE LIBRE 14 DAY SENSOR) MISC Inject 1 each into the skin every 14 (fourteen) days. Use as directed.  . diclofenac sodium (VOLTAREN) 1 %  GEL Apply 2 g topically 4 (four) times daily. (Patient taking differently: Apply 2 g topically 4 (four) times daily as needed (pain). )  . gabapentin (NEURONTIN) 100 MG capsule Take 1 capsule (100 mg total) by mouth 3 (three) times daily.  Marland Kitchen glucose blood (PRODIGY NO CODING BLOOD GLUC) test strip USE TO CHECK BLOOD SUGAR TWICE DAILY. Dx E11.22  . Insulin Glargine (LANTUS SOLOSTAR) 100 UNIT/ML Solostar Pen Inject 18-50 Units into the skin at bedtime. (Patient taking differently: Inject 20 Units into the skin at bedtime. )  . Insulin Pen Needle (TRUEPLUS PEN NEEDLES) 31G X 5 MM MISC Use daily with insulin Dx E11.22  . lidocaine-prilocaine (EMLA) cream Apply 1 application topically daily as needed (arm port).  Marland Kitchen linagliptin (TRADJENTA) 5 MG TABS tablet Take 1 tablet (5 mg total) by mouth daily.  . multivitamin (RENA-VIT) TABS tablet Take 1 tablet by mouth daily.  . pantoprazole (PROTONIX) 40 MG tablet TAKE 1 BY MOUTH DAILY 30 minutes before breakfast (Patient taking differently: Take 40 mg by mouth daily. )  . Probiotic Product (CULTURELLE PROBIOTICS PO) Take 1 capsule by mouth daily.   Marland Kitchen PRODIGY TWIST TOP LANCETS 28G MISC USE TO CHECK BLOOD SUGAR UP TO FOUR TIMES DAILY.  Derrill Memo ON 06/15/2019] sertraline (ZOLOFT) 100 MG tablet Take 1.5 tablets (150 mg total) by mouth daily. (Patient taking differently: Take 150 mg by mouth at bedtime. )  . sevelamer carbonate (RENVELA) 800 MG tablet Take 2,400 mg by mouth See admin instructions. Take 2455m by mouth three times daily with means and 8035mwith snacks  . traMADol (ULTRAM) 50 MG tablet Take 1 tablet (50 mg total) by mouth every 8 (eight) hours as needed. (Patient taking differently: Take 50 mg by mouth every 8 (eight) hours as needed for moderate pain. )  . traZODone (DESYREL) 50 MG tablet Take 0.5-1 tablets (25-50 mg total) by mouth at bedtime as needed. for sleep (Patient taking differently: Take 25 mg by mouth at bedtime as needed for sleep. )  .  doxycycline (VIBRA-TABS) 100 MG tablet Take 1 tablet (100 mg total) by mouth 2 (two) times daily.   No facility-administered encounter medications on file as  of 06/12/2019.     Allergies  Allergen Reactions  . Bactrim [Sulfamethoxazole-Trimethoprim] Nausea And Vomiting  . Prednisone Other (See Comments)    "I was wide open and couldn't eat" per pt.     Review of Systems  Constitutional: Negative for activity change, appetite change, chills, fatigue and fever.  HENT: Negative.   Eyes: Negative.   Respiratory: Negative for cough, chest tightness and shortness of breath.   Cardiovascular: Negative for chest pain, palpitations and leg swelling.  Gastrointestinal: Negative for abdominal pain, blood in stool, constipation, diarrhea, nausea and vomiting.  Endocrine: Negative.   Genitourinary: Negative for dysuria, frequency and urgency.  Musculoskeletal: Negative for arthralgias and myalgias.  Skin: Positive for color change and wound.  Allergic/Immunologic: Negative.   Neurological: Negative for dizziness, weakness and headaches.  Hematological: Negative.   Psychiatric/Behavioral: Negative for confusion, hallucinations, sleep disturbance and suicidal ideas.  All other systems reviewed and are negative.       Objective:  BP (!) 162/90   Pulse 81   Temp 98 F (36.7 C)   Resp 20   Ht 5' 3"  (1.6 m)   Wt 190 lb (86.2 kg)   LMP 05/05/2015   SpO2 96%   BMI 33.66 kg/m    Wt Readings from Last 3 Encounters:  06/12/19 190 lb (86.2 kg)  05/15/19 179 lb (81.2 kg)  05/04/19 186 lb 8 oz (84.6 kg)    Physical Exam Vitals signs and nursing note reviewed.  Constitutional:      General: She is not in acute distress.    Appearance: Normal appearance. She is well-developed and well-groomed. She is not ill-appearing, toxic-appearing or diaphoretic.  HENT:     Head: Normocephalic and atraumatic.     Jaw: There is normal jaw occlusion.     Right Ear: Hearing normal.     Left Ear:  Hearing normal.     Nose: Nose normal.     Mouth/Throat:     Lips: Pink.     Mouth: Mucous membranes are moist.     Pharynx: Oropharynx is clear. Uvula midline.  Eyes:     General: Lids are normal.     Extraocular Movements: Extraocular movements intact.     Conjunctiva/sclera: Conjunctivae normal.     Pupils: Pupils are equal, round, and reactive to light.  Neck:     Musculoskeletal: Normal range of motion and neck supple.     Thyroid: No thyroid mass, thyromegaly or thyroid tenderness.     Vascular: No carotid bruit or JVD.     Trachea: Trachea and phonation normal.  Cardiovascular:     Rate and Rhythm: Normal rate and regular rhythm.     Chest Wall: PMI is not displaced.     Pulses: Normal pulses.     Heart sounds: Normal heart sounds. No murmur. No friction rub. No gallop.      Arteriovenous access: right arteriovenous access is present.    Comments: Right upper arm AV shunt with good bruit and thrill.  Pulmonary:     Effort: Pulmonary effort is normal. No respiratory distress.     Breath sounds: Normal breath sounds. No wheezing.  Abdominal:     General: Bowel sounds are normal. There is no distension or abdominal bruit.     Palpations: Abdomen is soft. There is no hepatomegaly or splenomegaly.     Tenderness: There is no abdominal tenderness. There is no right CVA tenderness or left CVA tenderness.     Hernia: No hernia  is present.  Musculoskeletal: Normal range of motion.     Right lower leg: No edema.     Left lower leg: No edema.  Lymphadenopathy:     Cervical: No cervical adenopathy.  Skin:    General: Skin is warm and dry.     Capillary Refill: Capillary refill takes less than 2 seconds.     Coloration: Skin is not cyanotic, jaundiced or pale.     Findings: Erythema and wound present.     Comments: Large furuncle in left axilla with significant induration and surrounding erythema, approximately 8 cm x 6 cm. Abscess has opening in central region from previous I&D  attempt, opening with purulent drainage. Attempted to express drainage, unable due to tenderness. Not fluctuant.   Neurological:     General: No focal deficit present.     Mental Status: She is alert and oriented to person, place, and time.     Cranial Nerves: Cranial nerves are intact.     Sensory: Sensation is intact.     Motor: Motor function is intact.     Coordination: Coordination is intact.     Gait: Gait is intact.     Deep Tendon Reflexes: Reflexes are normal and symmetric.  Psychiatric:        Attention and Perception: Attention and perception normal.        Mood and Affect: Mood and affect normal.        Speech: Speech normal.        Behavior: Behavior normal. Behavior is cooperative.        Thought Content: Thought content normal.        Cognition and Memory: Cognition and memory normal.        Judgment: Judgment normal.     Results for orders placed or performed during the hospital encounter of 05/15/19  CBC with Differential/Platelet  Result Value Ref Range   WBC 7.2 4.0 - 10.5 K/uL   RBC 3.41 (L) 3.87 - 5.11 MIL/uL   Hemoglobin 11.0 (L) 12.0 - 15.0 g/dL   HCT 34.6 (L) 36.0 - 46.0 %   MCV 101.5 (H) 80.0 - 100.0 fL   MCH 32.3 26.0 - 34.0 pg   MCHC 31.8 30.0 - 36.0 g/dL   RDW 13.5 11.5 - 15.5 %   Platelets 189 150 - 400 K/uL   nRBC 0.0 0.0 - 0.2 %   Neutrophils Relative % 74 %   Neutro Abs 5.3 1.7 - 7.7 K/uL   Lymphocytes Relative 16 %   Lymphs Abs 1.1 0.7 - 4.0 K/uL   Monocytes Relative 7 %   Monocytes Absolute 0.5 0.1 - 1.0 K/uL   Eosinophils Relative 2 %   Eosinophils Absolute 0.2 0.0 - 0.5 K/uL   Basophils Relative 1 %   Basophils Absolute 0.1 0.0 - 0.1 K/uL   Immature Granulocytes 0 %   Abs Immature Granulocytes 0.01 0.00 - 0.07 K/uL  Protime-INR  Result Value Ref Range   Prothrombin Time 13.1 11.4 - 15.2 seconds   INR 1.0 0.8 - 1.2  Glucose, capillary  Result Value Ref Range   Glucose-Capillary 118 (H) 70 - 99 mg/dL   Comment 1 Notify RN     Comment 2 Document in Chart   Basic metabolic panel  Result Value Ref Range   Sodium 135 135 - 145 mmol/L   Potassium 5.9 (H) 3.5 - 5.1 mmol/L   Chloride 99 98 - 111 mmol/L   CO2 22 22 - 32 mmol/L  Glucose, Bld 108 (H) 70 - 99 mg/dL   BUN 54 (H) 6 - 20 mg/dL   Creatinine, Ser 8.77 (H) 0.44 - 1.00 mg/dL   Calcium 9.0 8.9 - 10.3 mg/dL   GFR calc non Af Amer 5 (L) >60 mL/min   GFR calc Af Amer 6 (L) >60 mL/min   Anion gap 14 5 - 15       Pertinent labs & imaging results that were available during my care of the patient were reviewed by me and considered in my medical decision making.  Assessment & Plan:  Jeannene was seen today for left arm abscess.  Diagnoses and all orders for this visit:  Abscess of left axilla Pt with large furuncle / abscess to left axilla with surrounding erythema.abscess not fluctuant. Abscess was previously opened in ED at [facility]. Pt reports worsening of symptoms since procedure. No fever or chills. Has been taking clindamycin that was prescribed. Pt high risk due to DM, ESRD in dialysis, and HTN. Pt was advised to return to ED for possible surgical excision of abscess and IV antibiotics. Pt declines to go to the hospital. Pt encouraged to go and still refuses. States she will get someone to take her if she gets worse. Will place urgent referral to surgery and add doxycycline to antibiotic regimen.  -     doxycycline (VIBRA-TABS) 100 MG tablet; Take 1 tablet (100 mg total) by mouth 2 (two) times daily. -     Ambulatory referral to General Surgery     Continue all other maintenance medications.  Follow up plan: Return in about 1 week (around 06/19/2019), or if symptoms worsen or fail to improve.  Continue healthy lifestyle choices, including diet (rich in fruits, vegetables, and lean proteins, and low in salt and simple carbohydrates) and exercise (at least 30 minutes of moderate physical activity daily).  Educational handout given for skin abscess   The above assessment and management plan was discussed with the patient. The patient verbalized understanding of and has agreed to the management plan. Patient is aware to call the clinic if they develop any new symptoms or if symptoms persist or worsen. Patient is aware when to return to the clinic for a follow-up visit. Patient educated on when it is appropriate to go to the emergency department.   Monia Pouch, FNP-C [facility] [phone]

## 2019-06-15 ENCOUNTER — Telehealth: Payer: Self-pay | Admitting: Family Medicine

## 2019-06-15 ENCOUNTER — Ambulatory Visit: Payer: Medicare Other | Admitting: Podiatry

## 2019-06-15 NOTE — Telephone Encounter (Signed)
Letter written and faxed to [facility] at [phone] and pt is aware.

## 2019-06-16 DIAGNOSIS — N186 End stage renal disease: Secondary | ICD-10-CM | POA: Diagnosis not present

## 2019-06-16 DIAGNOSIS — Z992 Dependence on renal dialysis: Secondary | ICD-10-CM | POA: Diagnosis not present

## 2019-06-16 DIAGNOSIS — Z23 Encounter for immunization: Secondary | ICD-10-CM | POA: Diagnosis not present

## 2019-06-16 DIAGNOSIS — D631 Anemia in chronic kidney disease: Secondary | ICD-10-CM | POA: Diagnosis not present

## 2019-06-16 DIAGNOSIS — N250 Renal osteodystrophy: Secondary | ICD-10-CM | POA: Diagnosis not present

## 2019-06-18 ENCOUNTER — Encounter: Payer: Self-pay | Admitting: Family

## 2019-06-18 ENCOUNTER — Ambulatory Visit (INDEPENDENT_AMBULATORY_CARE_PROVIDER_SITE_OTHER): Payer: Medicare Other | Admitting: Family

## 2019-06-18 ENCOUNTER — Other Ambulatory Visit: Payer: Self-pay

## 2019-06-18 DIAGNOSIS — Z23 Encounter for immunization: Secondary | ICD-10-CM | POA: Diagnosis not present

## 2019-06-18 DIAGNOSIS — D631 Anemia in chronic kidney disease: Secondary | ICD-10-CM | POA: Diagnosis not present

## 2019-06-18 DIAGNOSIS — N186 End stage renal disease: Secondary | ICD-10-CM | POA: Diagnosis not present

## 2019-06-18 DIAGNOSIS — M62838 Other muscle spasm: Secondary | ICD-10-CM

## 2019-06-18 DIAGNOSIS — M549 Dorsalgia, unspecified: Secondary | ICD-10-CM | POA: Diagnosis not present

## 2019-06-18 DIAGNOSIS — N250 Renal osteodystrophy: Secondary | ICD-10-CM | POA: Diagnosis not present

## 2019-06-18 DIAGNOSIS — Z992 Dependence on renal dialysis: Secondary | ICD-10-CM

## 2019-06-18 MED ORDER — VITAMIN E 180 MG (400 UNIT) PO CAPS: 400.0000 [IU] | ORAL_CAPSULE | Freq: Every day | ORAL | 1 refills | Status: DC

## 2019-06-18 MED ORDER — CYCLOBENZAPRINE HCL 5 MG PO TABS: 5.0000 mg | ORAL_TABLET | Freq: Three times a day (TID) | ORAL | 3 refills | Status: DC | PRN

## 2019-06-18 NOTE — Progress Notes (Signed)
Virtual Visit via telephone Note Due to COVID-19 pandemic this visit was conducted virtually. This visit type was conducted due to national recommendations for restrictions regarding the COVID-19 Pandemic (e.g. social distancing, sheltering in place) in an effort to limit this patient's exposure and mitigate transmission in our community. All issues noted in this document were discussed and addressed.  A physical exam was not performed with this format.  I connected with Kara Knapp on 06/18/19 at 3:38 pm by telephone and verified that I am speaking with the correct person using two identifiers. Kara Knapp is currently located at home and aid is currently with her during visit. The provider, Evelina Dun, FNP is located in their office at time of visit.  I discussed the limitations, risks, security and privacy concerns of performing an evaluation and management service by telephone and the availability of in person appointments. I also discussed with the patient that there may be a patient responsible charge related to this service. The patient expressed understanding and agreed to proceed.   History and Present Illness:  Back Pain This is a recurrent problem. The current episode started 1 to 4 weeks ago. The problem occurs intermittently. The problem has been waxing and waning since onset. The pain is present in the thoracic spine and lumbar spine. The quality of the pain is described as cramping. The pain does not radiate. The pain is at a severity of 8/10. The pain is moderate. The pain is worse during the night. The symptoms are aggravated by lying down. Pertinent negatives include no bladder incontinence, bowel incontinence, leg pain, tingling or weakness. Risk factors include obesity. She has tried NSAIDs and bed rest for the symptoms. The treatment provided mild relief.      Review of Systems  Constitutional: Positive for malaise/fatigue.  Gastrointestinal: Negative for bowel  incontinence.  Genitourinary: Negative for bladder incontinence.  Musculoskeletal: Positive for back pain.  Neurological: Negative for tingling and weakness.  All other systems reviewed and are negative.    Observations/Objective: No SOB or distress noted   Assessment and Plan: 1. Muscle spasm - cyclobenzaprine (FLEXERIL) 5 MG tablet; Take 1 tablet (5 mg total) by mouth 3 (three) times daily as needed for muscle spasms.  Dispense: 60 tablet; Refill: 3 - vitamin E (VITAMIN E) 400 UNIT capsule; Take 1 capsule (400 Units total) by mouth daily.  Dispense: 90 capsule; Refill: 1  2. Acute bilateral back pain, unspecified back location - cyclobenzaprine (FLEXERIL) 5 MG tablet; Take 1 tablet (5 mg total) by mouth 3 (three) times daily as needed for muscle spasms.  Dispense: 60 tablet; Refill: 3  3. ESRD on dialysis [facility])  Will try Vit E 400 units at bed time Will give Flexeril 5 mg to take as needed if cramps worsen. Discuss cramps with dialysis and let them be aware.    I discussed the assessment and treatment plan with the patient. The patient was provided an opportunity to ask questions and all were answered. The patient agreed with the plan and demonstrated an understanding of the instructions.   The patient was advised to call back or seek an in-person evaluation if the symptoms worsen or if the condition fails to improve as anticipated.  The above assessment and management plan was discussed with the patient. The patient verbalized understanding of and has agreed to the management plan. Patient is aware to call the clinic if symptoms persist or worsen. Patient is aware when to return to the clinic  for a follow-up visit. Patient educated on when it is appropriate to go to the emergency department.   Time call ended:  3:52 pm  I provided 14 minutes of non-face-to-face time during this encounter.    Evelina Dun, FNP

## 2019-06-22 DIAGNOSIS — Z23 Encounter for immunization: Secondary | ICD-10-CM | POA: Diagnosis not present

## 2019-06-22 DIAGNOSIS — N186 End stage renal disease: Secondary | ICD-10-CM | POA: Diagnosis not present

## 2019-06-22 DIAGNOSIS — D631 Anemia in chronic kidney disease: Secondary | ICD-10-CM | POA: Diagnosis not present

## 2019-06-22 DIAGNOSIS — N250 Renal osteodystrophy: Secondary | ICD-10-CM | POA: Diagnosis not present

## 2019-06-22 DIAGNOSIS — Z992 Dependence on renal dialysis: Secondary | ICD-10-CM | POA: Diagnosis not present

## 2019-06-23 DIAGNOSIS — Z992 Dependence on renal dialysis: Secondary | ICD-10-CM | POA: Diagnosis not present

## 2019-06-23 DIAGNOSIS — N186 End stage renal disease: Secondary | ICD-10-CM | POA: Diagnosis not present

## 2019-06-23 DIAGNOSIS — Z23 Encounter for immunization: Secondary | ICD-10-CM | POA: Diagnosis not present

## 2019-06-23 DIAGNOSIS — N250 Renal osteodystrophy: Secondary | ICD-10-CM | POA: Diagnosis not present

## 2019-06-23 DIAGNOSIS — D631 Anemia in chronic kidney disease: Secondary | ICD-10-CM | POA: Diagnosis not present

## 2019-06-26 DIAGNOSIS — N250 Renal osteodystrophy: Secondary | ICD-10-CM | POA: Diagnosis not present

## 2019-06-26 DIAGNOSIS — D631 Anemia in chronic kidney disease: Secondary | ICD-10-CM | POA: Diagnosis not present

## 2019-06-26 DIAGNOSIS — Z23 Encounter for immunization: Secondary | ICD-10-CM | POA: Diagnosis not present

## 2019-06-26 DIAGNOSIS — N186 End stage renal disease: Secondary | ICD-10-CM | POA: Diagnosis not present

## 2019-06-26 DIAGNOSIS — Z992 Dependence on renal dialysis: Secondary | ICD-10-CM | POA: Diagnosis not present

## 2019-06-27 DIAGNOSIS — Z992 Dependence on renal dialysis: Secondary | ICD-10-CM | POA: Diagnosis not present

## 2019-06-27 DIAGNOSIS — N250 Renal osteodystrophy: Secondary | ICD-10-CM | POA: Diagnosis not present

## 2019-06-27 DIAGNOSIS — Z23 Encounter for immunization: Secondary | ICD-10-CM | POA: Diagnosis not present

## 2019-06-27 DIAGNOSIS — N186 End stage renal disease: Secondary | ICD-10-CM | POA: Diagnosis not present

## 2019-06-27 DIAGNOSIS — D631 Anemia in chronic kidney disease: Secondary | ICD-10-CM | POA: Diagnosis not present

## 2019-06-29 DIAGNOSIS — Z992 Dependence on renal dialysis: Secondary | ICD-10-CM | POA: Diagnosis not present

## 2019-06-29 DIAGNOSIS — N186 End stage renal disease: Secondary | ICD-10-CM | POA: Diagnosis not present

## 2019-06-30 DIAGNOSIS — Z992 Dependence on renal dialysis: Secondary | ICD-10-CM | POA: Diagnosis not present

## 2019-06-30 DIAGNOSIS — N250 Renal osteodystrophy: Secondary | ICD-10-CM | POA: Diagnosis not present

## 2019-06-30 DIAGNOSIS — D509 Iron deficiency anemia, unspecified: Secondary | ICD-10-CM | POA: Diagnosis not present

## 2019-06-30 DIAGNOSIS — N186 End stage renal disease: Secondary | ICD-10-CM | POA: Diagnosis not present

## 2019-06-30 DIAGNOSIS — D631 Anemia in chronic kidney disease: Secondary | ICD-10-CM | POA: Diagnosis not present

## 2019-07-01 DIAGNOSIS — H4311 Vitreous hemorrhage, right eye: Secondary | ICD-10-CM | POA: Diagnosis not present

## 2019-07-01 DIAGNOSIS — E113511 Type 2 diabetes mellitus with proliferative diabetic retinopathy with macular edema, right eye: Secondary | ICD-10-CM | POA: Diagnosis not present

## 2019-07-01 DIAGNOSIS — E113592 Type 2 diabetes mellitus with proliferative diabetic retinopathy without macular edema, left eye: Secondary | ICD-10-CM | POA: Diagnosis not present

## 2019-07-01 DIAGNOSIS — H26491 Other secondary cataract, right eye: Secondary | ICD-10-CM | POA: Diagnosis not present

## 2019-07-02 DIAGNOSIS — N250 Renal osteodystrophy: Secondary | ICD-10-CM | POA: Diagnosis not present

## 2019-07-02 DIAGNOSIS — N186 End stage renal disease: Secondary | ICD-10-CM | POA: Diagnosis not present

## 2019-07-02 DIAGNOSIS — Z992 Dependence on renal dialysis: Secondary | ICD-10-CM | POA: Diagnosis not present

## 2019-07-02 DIAGNOSIS — D509 Iron deficiency anemia, unspecified: Secondary | ICD-10-CM | POA: Diagnosis not present

## 2019-07-02 DIAGNOSIS — D631 Anemia in chronic kidney disease: Secondary | ICD-10-CM | POA: Diagnosis not present

## 2019-07-03 ENCOUNTER — Telehealth: Payer: Self-pay | Admitting: Family Medicine

## 2019-07-03 NOTE — Telephone Encounter (Signed)
Rx written for talking glucose meter and signed by Dr Warrick Parisian. Faxed to [facility] per pt.

## 2019-07-03 NOTE — Telephone Encounter (Signed)
Patient called stating that she is needing a new talking glucose machine because hers is not working anymore. Wants to speak with nurse about it.

## 2019-07-04 DIAGNOSIS — N186 End stage renal disease: Secondary | ICD-10-CM | POA: Diagnosis not present

## 2019-07-04 DIAGNOSIS — Z992 Dependence on renal dialysis: Secondary | ICD-10-CM | POA: Diagnosis not present

## 2019-07-04 DIAGNOSIS — D631 Anemia in chronic kidney disease: Secondary | ICD-10-CM | POA: Diagnosis not present

## 2019-07-04 DIAGNOSIS — D509 Iron deficiency anemia, unspecified: Secondary | ICD-10-CM | POA: Diagnosis not present

## 2019-07-04 DIAGNOSIS — N250 Renal osteodystrophy: Secondary | ICD-10-CM | POA: Diagnosis not present

## 2019-07-07 DIAGNOSIS — Z992 Dependence on renal dialysis: Secondary | ICD-10-CM | POA: Diagnosis not present

## 2019-07-07 DIAGNOSIS — N250 Renal osteodystrophy: Secondary | ICD-10-CM | POA: Diagnosis not present

## 2019-07-07 DIAGNOSIS — D631 Anemia in chronic kidney disease: Secondary | ICD-10-CM | POA: Diagnosis not present

## 2019-07-07 DIAGNOSIS — D509 Iron deficiency anemia, unspecified: Secondary | ICD-10-CM | POA: Diagnosis not present

## 2019-07-07 DIAGNOSIS — N186 End stage renal disease: Secondary | ICD-10-CM | POA: Diagnosis not present

## 2019-07-10 DIAGNOSIS — D631 Anemia in chronic kidney disease: Secondary | ICD-10-CM | POA: Diagnosis not present

## 2019-07-10 DIAGNOSIS — N186 End stage renal disease: Secondary | ICD-10-CM | POA: Diagnosis not present

## 2019-07-10 DIAGNOSIS — N250 Renal osteodystrophy: Secondary | ICD-10-CM | POA: Diagnosis not present

## 2019-07-10 DIAGNOSIS — Z992 Dependence on renal dialysis: Secondary | ICD-10-CM | POA: Diagnosis not present

## 2019-07-10 DIAGNOSIS — D509 Iron deficiency anemia, unspecified: Secondary | ICD-10-CM | POA: Diagnosis not present

## 2019-07-11 DIAGNOSIS — N250 Renal osteodystrophy: Secondary | ICD-10-CM | POA: Diagnosis not present

## 2019-07-11 DIAGNOSIS — N186 End stage renal disease: Secondary | ICD-10-CM | POA: Diagnosis not present

## 2019-07-11 DIAGNOSIS — Z992 Dependence on renal dialysis: Secondary | ICD-10-CM | POA: Diagnosis not present

## 2019-07-11 DIAGNOSIS — D509 Iron deficiency anemia, unspecified: Secondary | ICD-10-CM | POA: Diagnosis not present

## 2019-07-11 DIAGNOSIS — D631 Anemia in chronic kidney disease: Secondary | ICD-10-CM | POA: Diagnosis not present

## 2019-07-14 DIAGNOSIS — D631 Anemia in chronic kidney disease: Secondary | ICD-10-CM | POA: Diagnosis not present

## 2019-07-14 DIAGNOSIS — D509 Iron deficiency anemia, unspecified: Secondary | ICD-10-CM | POA: Diagnosis not present

## 2019-07-14 DIAGNOSIS — N186 End stage renal disease: Secondary | ICD-10-CM | POA: Diagnosis not present

## 2019-07-14 DIAGNOSIS — N250 Renal osteodystrophy: Secondary | ICD-10-CM | POA: Diagnosis not present

## 2019-07-14 DIAGNOSIS — Z992 Dependence on renal dialysis: Secondary | ICD-10-CM | POA: Diagnosis not present

## 2019-07-15 ENCOUNTER — Ambulatory Visit: Payer: Medicare Other | Admitting: Gastroenterology

## 2019-07-16 DIAGNOSIS — N250 Renal osteodystrophy: Secondary | ICD-10-CM | POA: Diagnosis not present

## 2019-07-16 DIAGNOSIS — D509 Iron deficiency anemia, unspecified: Secondary | ICD-10-CM | POA: Diagnosis not present

## 2019-07-16 DIAGNOSIS — N186 End stage renal disease: Secondary | ICD-10-CM | POA: Diagnosis not present

## 2019-07-16 DIAGNOSIS — Z992 Dependence on renal dialysis: Secondary | ICD-10-CM | POA: Diagnosis not present

## 2019-07-16 DIAGNOSIS — D631 Anemia in chronic kidney disease: Secondary | ICD-10-CM | POA: Diagnosis not present

## 2019-07-18 ENCOUNTER — Other Ambulatory Visit: Payer: Self-pay | Admitting: Family Medicine

## 2019-07-18 DIAGNOSIS — Z992 Dependence on renal dialysis: Secondary | ICD-10-CM | POA: Diagnosis not present

## 2019-07-18 DIAGNOSIS — D509 Iron deficiency anemia, unspecified: Secondary | ICD-10-CM | POA: Diagnosis not present

## 2019-07-18 DIAGNOSIS — N250 Renal osteodystrophy: Secondary | ICD-10-CM | POA: Diagnosis not present

## 2019-07-18 DIAGNOSIS — N186 End stage renal disease: Secondary | ICD-10-CM | POA: Diagnosis not present

## 2019-07-18 DIAGNOSIS — D631 Anemia in chronic kidney disease: Secondary | ICD-10-CM | POA: Diagnosis not present

## 2019-07-21 DIAGNOSIS — D631 Anemia in chronic kidney disease: Secondary | ICD-10-CM | POA: Diagnosis not present

## 2019-07-21 DIAGNOSIS — D509 Iron deficiency anemia, unspecified: Secondary | ICD-10-CM | POA: Diagnosis not present

## 2019-07-21 DIAGNOSIS — N186 End stage renal disease: Secondary | ICD-10-CM | POA: Diagnosis not present

## 2019-07-21 DIAGNOSIS — N250 Renal osteodystrophy: Secondary | ICD-10-CM | POA: Diagnosis not present

## 2019-07-21 DIAGNOSIS — Z992 Dependence on renal dialysis: Secondary | ICD-10-CM | POA: Diagnosis not present

## 2019-07-23 DIAGNOSIS — D631 Anemia in chronic kidney disease: Secondary | ICD-10-CM | POA: Diagnosis not present

## 2019-07-23 DIAGNOSIS — N250 Renal osteodystrophy: Secondary | ICD-10-CM | POA: Diagnosis not present

## 2019-07-23 DIAGNOSIS — Z992 Dependence on renal dialysis: Secondary | ICD-10-CM | POA: Diagnosis not present

## 2019-07-23 DIAGNOSIS — D509 Iron deficiency anemia, unspecified: Secondary | ICD-10-CM | POA: Diagnosis not present

## 2019-07-23 DIAGNOSIS — N186 End stage renal disease: Secondary | ICD-10-CM | POA: Diagnosis not present

## 2019-07-25 DIAGNOSIS — N250 Renal osteodystrophy: Secondary | ICD-10-CM | POA: Diagnosis not present

## 2019-07-25 DIAGNOSIS — Z992 Dependence on renal dialysis: Secondary | ICD-10-CM | POA: Diagnosis not present

## 2019-07-25 DIAGNOSIS — N186 End stage renal disease: Secondary | ICD-10-CM | POA: Diagnosis not present

## 2019-07-25 DIAGNOSIS — D509 Iron deficiency anemia, unspecified: Secondary | ICD-10-CM | POA: Diagnosis not present

## 2019-07-25 DIAGNOSIS — D631 Anemia in chronic kidney disease: Secondary | ICD-10-CM | POA: Diagnosis not present

## 2019-07-27 ENCOUNTER — Telehealth: Payer: Self-pay | Admitting: Family Medicine

## 2019-07-27 NOTE — Telephone Encounter (Signed)
Patient last took BS less than an hour ago it was 353. Last ate toast and oatmeal 9:30am. Patient does have headache and some fatigue. Took Lantus 18 units at 10pm last night.

## 2019-07-27 NOTE — Telephone Encounter (Signed)
Spoke with pt's caregiver and advised of provider feedback and she voiced understanding.

## 2019-07-27 NOTE — Telephone Encounter (Signed)
I reviewed the patient's last visit with her PCP and it appears that his instructions for the Lantus was to increase by 1 unit every 2 days for fasting blood sugars above 150.  Would recommend that she go ahead and go up to 20 units tonight.  Monitor blood sugar tomorrow morning and the following day.  If still above 150, go up to 21 units and so on and so forth.  Please have her schedule a follow-up visit with her PCP for ongoing blood sugar management.  May benefit from mealtime insulin.  If blood sugars go above 500, would recommend evaluation in the emergency department.

## 2019-07-28 DIAGNOSIS — N250 Renal osteodystrophy: Secondary | ICD-10-CM | POA: Diagnosis not present

## 2019-07-28 DIAGNOSIS — D509 Iron deficiency anemia, unspecified: Secondary | ICD-10-CM | POA: Diagnosis not present

## 2019-07-28 DIAGNOSIS — N186 End stage renal disease: Secondary | ICD-10-CM | POA: Diagnosis not present

## 2019-07-28 DIAGNOSIS — D631 Anemia in chronic kidney disease: Secondary | ICD-10-CM | POA: Diagnosis not present

## 2019-07-28 DIAGNOSIS — Z992 Dependence on renal dialysis: Secondary | ICD-10-CM | POA: Diagnosis not present

## 2019-07-30 DIAGNOSIS — N250 Renal osteodystrophy: Secondary | ICD-10-CM | POA: Diagnosis not present

## 2019-07-30 DIAGNOSIS — Z992 Dependence on renal dialysis: Secondary | ICD-10-CM | POA: Diagnosis not present

## 2019-07-30 DIAGNOSIS — N186 End stage renal disease: Secondary | ICD-10-CM | POA: Diagnosis not present

## 2019-07-30 DIAGNOSIS — D509 Iron deficiency anemia, unspecified: Secondary | ICD-10-CM | POA: Diagnosis not present

## 2019-07-30 DIAGNOSIS — D631 Anemia in chronic kidney disease: Secondary | ICD-10-CM | POA: Diagnosis not present

## 2019-08-01 DIAGNOSIS — D509 Iron deficiency anemia, unspecified: Secondary | ICD-10-CM | POA: Diagnosis not present

## 2019-08-01 DIAGNOSIS — N186 End stage renal disease: Secondary | ICD-10-CM | POA: Diagnosis not present

## 2019-08-01 DIAGNOSIS — N250 Renal osteodystrophy: Secondary | ICD-10-CM | POA: Diagnosis not present

## 2019-08-01 DIAGNOSIS — D631 Anemia in chronic kidney disease: Secondary | ICD-10-CM | POA: Diagnosis not present

## 2019-08-01 DIAGNOSIS — Z992 Dependence on renal dialysis: Secondary | ICD-10-CM | POA: Diagnosis not present

## 2019-08-01 DIAGNOSIS — T82868A Thrombosis of vascular prosthetic devices, implants and grafts, initial encounter: Secondary | ICD-10-CM | POA: Diagnosis not present

## 2019-08-03 ENCOUNTER — Ambulatory Visit (INDEPENDENT_AMBULATORY_CARE_PROVIDER_SITE_OTHER): Payer: Medicare Other | Admitting: Licensed Clinical Social Worker

## 2019-08-03 DIAGNOSIS — F3341 Major depressive disorder, recurrent, in partial remission: Secondary | ICD-10-CM
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
## 2019-08-04 DIAGNOSIS — D509 Iron deficiency anemia, unspecified: Secondary | ICD-10-CM | POA: Diagnosis not present

## 2019-08-04 DIAGNOSIS — Z992 Dependence on renal dialysis: Secondary | ICD-10-CM | POA: Diagnosis not present

## 2019-08-04 DIAGNOSIS — T82868A Thrombosis of vascular prosthetic devices, implants and grafts, initial encounter: Secondary | ICD-10-CM | POA: Diagnosis not present

## 2019-08-04 DIAGNOSIS — N186 End stage renal disease: Secondary | ICD-10-CM | POA: Diagnosis not present

## 2019-08-04 DIAGNOSIS — N250 Renal osteodystrophy: Secondary | ICD-10-CM | POA: Diagnosis not present

## 2019-08-04 DIAGNOSIS — D631 Anemia in chronic kidney disease: Secondary | ICD-10-CM | POA: Diagnosis not present

## 2019-08-05 ENCOUNTER — Telehealth: Payer: Self-pay | Admitting: Family Medicine

## 2019-08-05 MED ORDER — ACIDOPHILUS PROBIOTIC PO CAPS: 1.0000 | ORAL_CAPSULE | Freq: Every day | ORAL | 3 refills | Status: DC

## 2019-08-05 NOTE — Telephone Encounter (Signed)
Patient states in order for her CAPS program to pay for her probiotic she has to have a rx of it sent to [facility]. Please advise

## 2019-08-05 NOTE — Telephone Encounter (Signed)
I sent a prescription for a probiotic Caryl Pina, MD [facility] 08/05/2019, 12:58 PM

## 2019-08-05 NOTE — Telephone Encounter (Signed)
.  What is the name of the medication? probioticc   Have you contacted your pharmacy to request a refill? yes  Which pharmacy would you like this sent to[facility] [place]   Patient notified that their request is being sent to the clinical staff for review and that they should receive a call once it is complete. If they do not receive a call within 24 hours they can check with their pharmacy or our office.

## 2019-08-05 NOTE — Telephone Encounter (Signed)
Patient aware.

## 2019-08-06 ENCOUNTER — Other Ambulatory Visit (HOSPITAL_COMMUNITY): Payer: Self-pay | Admitting: Nephrology

## 2019-08-06 DIAGNOSIS — Z992 Dependence on renal dialysis: Secondary | ICD-10-CM

## 2019-08-06 DIAGNOSIS — N186 End stage renal disease: Secondary | ICD-10-CM

## 2019-08-07 ENCOUNTER — Other Ambulatory Visit: Payer: Self-pay | Admitting: Student

## 2019-08-07 ENCOUNTER — Other Ambulatory Visit: Payer: Self-pay | Admitting: Radiology

## 2019-08-10 ENCOUNTER — Ambulatory Visit (HOSPITAL_COMMUNITY)
Admission: RE | Admit: 2019-08-10 | Discharge: 2019-08-10 | Disposition: A | Discharge status: Home / Self Care | Payer: Medicare Other | Source: Ambulatory Visit | Attending: Nephrology | Admitting: Nephrology

## 2019-08-10 ENCOUNTER — Other Ambulatory Visit: Payer: Self-pay

## 2019-08-10 ENCOUNTER — Other Ambulatory Visit (HOSPITAL_COMMUNITY): Payer: Self-pay | Admitting: Nephrology

## 2019-08-10 DIAGNOSIS — E1122 Type 2 diabetes mellitus with diabetic chronic kidney disease: Secondary | ICD-10-CM | POA: Diagnosis not present

## 2019-08-10 DIAGNOSIS — T82868A Thrombosis of vascular prosthetic devices, implants and grafts, initial encounter: Secondary | ICD-10-CM | POA: Diagnosis not present

## 2019-08-10 DIAGNOSIS — N186 End stage renal disease: Secondary | ICD-10-CM

## 2019-08-10 DIAGNOSIS — E1140 Type 2 diabetes mellitus with diabetic neuropathy, unspecified: Secondary | ICD-10-CM | POA: Insufficient documentation

## 2019-08-10 DIAGNOSIS — D509 Iron deficiency anemia, unspecified: Secondary | ICD-10-CM | POA: Diagnosis not present

## 2019-08-10 DIAGNOSIS — F17210 Nicotine dependence, cigarettes, uncomplicated: Secondary | ICD-10-CM | POA: Insufficient documentation

## 2019-08-10 DIAGNOSIS — I120 Hypertensive chronic kidney disease with stage 5 chronic kidney disease or end stage renal disease: Secondary | ICD-10-CM | POA: Diagnosis not present

## 2019-08-10 DIAGNOSIS — E785 Hyperlipidemia, unspecified: Secondary | ICD-10-CM | POA: Diagnosis not present

## 2019-08-10 DIAGNOSIS — Z992 Dependence on renal dialysis: Secondary | ICD-10-CM | POA: Insufficient documentation

## 2019-08-10 DIAGNOSIS — F329 Major depressive disorder, single episode, unspecified: Secondary | ICD-10-CM | POA: Insufficient documentation

## 2019-08-10 DIAGNOSIS — Y841 Kidney dialysis as the cause of abnormal reaction of the patient, or of later complication, without mention of misadventure at the time of the procedure: Secondary | ICD-10-CM | POA: Insufficient documentation

## 2019-08-10 DIAGNOSIS — J45909 Unspecified asthma, uncomplicated: Secondary | ICD-10-CM | POA: Insufficient documentation

## 2019-08-10 DIAGNOSIS — T82858A Stenosis of vascular prosthetic devices, implants and grafts, initial encounter: Secondary | ICD-10-CM | POA: Diagnosis not present

## 2019-08-10 DIAGNOSIS — N250 Renal osteodystrophy: Secondary | ICD-10-CM | POA: Diagnosis not present

## 2019-08-10 DIAGNOSIS — Z79899 Other long term (current) drug therapy: Secondary | ICD-10-CM | POA: Insufficient documentation

## 2019-08-10 DIAGNOSIS — D631 Anemia in chronic kidney disease: Secondary | ICD-10-CM | POA: Insufficient documentation

## 2019-08-10 DIAGNOSIS — Z794 Long term (current) use of insulin: Secondary | ICD-10-CM | POA: Insufficient documentation

## 2019-08-10 HISTORY — PX: IR US GUIDE VASC ACCESS RIGHT: IMG2390

## 2019-08-10 HISTORY — PX: IR THROMBECTOMY AV FISTULA W/THROMBOLYSIS/PTA INC/SHUNT/IMG RIGHT: IMG6119

## 2019-08-10 LAB — BASIC METABOLIC PANEL
Anion gap: 12 (ref 5–15)
BUN: 83 mg/dL — ABNORMAL HIGH (ref 6–20)
CO2: 21 mmol/L — ABNORMAL LOW (ref 22–32)
Calcium: 8.9 mg/dL (ref 8.9–10.3)
Chloride: 104 mmol/L (ref 98–111)
Creatinine, Ser: 10.8 mg/dL — ABNORMAL HIGH (ref 0.44–1.00)
GFR calc Af Amer: 4 mL/min — ABNORMAL LOW (ref >60)
GFR calc non Af Amer: 4 mL/min — ABNORMAL LOW (ref >60)
Glucose, Bld: 130 mg/dL — ABNORMAL HIGH (ref 70–99)
Potassium: 5.3 mmol/L — ABNORMAL HIGH (ref 3.5–5.1)
Sodium: 137 mmol/L (ref 135–145)

## 2019-08-10 LAB — CBC
HCT: 32.6 % — ABNORMAL LOW (ref 36.0–46.0)
Hemoglobin: 10.4 g/dL — ABNORMAL LOW (ref 12.0–15.0)
MCH: 31.7 pg (ref 26.0–34.0)
MCHC: 31.9 g/dL (ref 30.0–36.0)
MCV: 99.4 fL (ref 80.0–100.0)
Platelets: 172 10*3/uL (ref 150–400)
RBC: 3.28 MIL/uL — ABNORMAL LOW (ref 3.87–5.11)
RDW: 13.4 % (ref 11.5–15.5)
WBC: 7.4 10*3/uL (ref 4.0–10.5)
nRBC: 0 % (ref 0.0–0.2)

## 2019-08-10 LAB — GLUCOSE, CAPILLARY: Glucose-Capillary: 137 mg/dL — ABNORMAL HIGH (ref 70–99)

## 2019-08-10 LAB — PROTIME-INR
INR: 1 (ref 0.8–1.2)
Prothrombin Time: 12.9 seconds (ref 11.4–15.2)

## 2019-08-10 MED ORDER — ALTEPLASE 2 MG IJ SOLR
INTRAMUSCULAR | Status: AC | PRN
  Administered 2019-08-10: 2 mg

## 2019-08-10 MED ORDER — FENTANYL CITRATE (PF) 100 MCG/2ML IJ SOLN
INTRAMUSCULAR | Status: AC | PRN
  Administered 2019-08-10 (×2): 50 ug via INTRAVENOUS

## 2019-08-10 MED ORDER — MIDAZOLAM HCL 2 MG/2ML IJ SOLN
INTRAMUSCULAR | Status: AC | PRN
  Administered 2019-08-10 (×2): 1 mg via INTRAVENOUS

## 2019-08-10 MED ORDER — LIDOCAINE HCL 1 % IJ SOLN
INTRAMUSCULAR | Status: AC
  Filled 2019-08-10: qty 20

## 2019-08-10 MED ORDER — SODIUM CHLORIDE 0.9 % IV SOLN: INTRAVENOUS | Status: DC

## 2019-08-10 MED ORDER — HEPARIN SODIUM (PORCINE) 1000 UNIT/ML IJ SOLN
INTRAMUSCULAR | Status: AC | PRN
  Administered 2019-08-10: 3000 [IU] via INTRAVENOUS

## 2019-08-10 MED ORDER — LIDOCAINE HCL 1 % IJ SOLN
INTRAMUSCULAR | Status: AC | PRN
  Administered 2019-08-10: 10 mL

## 2019-08-10 MED ORDER — ALTEPLASE 2 MG IJ SOLR
INTRAMUSCULAR | Status: AC
  Filled 2019-08-10: qty 2

## 2019-08-10 MED ORDER — HYDROCODONE-ACETAMINOPHEN 5-325 MG PO TABS: 1.0000 | ORAL_TABLET | ORAL | Status: DC | PRN

## 2019-08-10 MED ORDER — MIDAZOLAM HCL 2 MG/2ML IJ SOLN
INTRAMUSCULAR | Status: AC
  Filled 2019-08-10: qty 2

## 2019-08-10 MED ORDER — IOHEXOL 300 MG/ML  SOLN
100.0000 mL | Freq: Once | INTRAMUSCULAR | Status: AC | PRN
  Administered 2019-08-10: 50 mL

## 2019-08-10 MED ORDER — SODIUM CHLORIDE 0.9 % IV SOLN
INTRAVENOUS | Status: AC | PRN
  Administered 2019-08-10: 10 mL/h via INTRAVENOUS

## 2019-08-10 MED ORDER — HEPARIN SODIUM (PORCINE) 1000 UNIT/ML IJ SOLN
INTRAMUSCULAR | Status: AC
  Filled 2019-08-10: qty 1

## 2019-08-10 MED ORDER — HYDRALAZINE HCL 20 MG/ML IJ SOLN
INTRAMUSCULAR | Status: AC
  Filled 2019-08-10: qty 1

## 2019-08-10 MED ORDER — FENTANYL CITRATE (PF) 100 MCG/2ML IJ SOLN
INTRAMUSCULAR | Status: AC
  Filled 2019-08-10: qty 2

## 2019-08-10 NOTE — Discharge Instructions (Signed)
fistulogram  Dialysis Fistulogram, Care After This sheet gives you information about how to care for yourself after your procedure. Your health care provider may also give you more specific instructions. If you have problems or questions, contact your health care provider. What can I expect after the procedure? After the procedure, it is common to have:  A small amount of discomfort in the area where the small, thin tube (catheter) was placed for the procedure.  A small amount of bruising around the fistula.  Sleepiness and tiredness (fatigue). Follow these instructions at home: Activity   Rest at home and do not lift anything that is heavier than 5 lb (2.3 kg) on the day after your procedure.  Return to your normal activities as told by your health care provider. Ask your health care provider what activities are safe for you.  Do not drive or use heavy machinery while taking prescription pain medicine.  Do not drive for 24 hours if you were given a medicine to help you relax (sedative) during your procedure. Medicines   Take over-the-counter and prescription medicines only as told by your health care provider. Puncture site care  Follow instructions from your health care provider about how to take care of the site where catheters were inserted. Make sure you: ? Wash your hands with soap and water before you change your bandage (dressing). If soap and water are not available, use hand sanitizer. ? Change your dressing as told by your health care provider. ? Leave stitches (sutures), skin glue, or adhesive strips in place. These skin closures may need to stay in place for 2 weeks or longer. If adhesive strip edges start to loosen and curl up, you may trim the loose edges. Do not remove adhesive strips completely unless your health care provider tells you to do that.  Check your puncture area every day for signs of infection. Check for: ? Redness, swelling, or pain. ? Fluid or  blood. ? Warmth. ? Pus or a bad smell. General instructions  Do not take baths, swim, or use a hot tub until your health care provider approves. Ask your health care provider if you may take showers. You may only be allowed to take sponge baths.  Monitor your dialysis fistula closely. Check to make sure that you can feel a vibration or buzz (a thrill) when you put your fingers over the fistula.  Prevent damage to your graft or fistula: ? Do not wear tight-fitting clothing or jewelry on the arm or leg that has your graft or fistula. ? Tell all your health care providers that you have a dialysis fistula or graft. ? Do not allow blood draws, IVs, or blood pressure readings to be done in the arm that has your fistula or graft. ? Do not allow flu shots or vaccinations in the arm with your fistula or graft.  Keep all follow-up visits as told by your health care provider. This is important. Contact a health care provider if:  You have redness, swelling, or pain at the site where the catheter was put in.  You have fluid or blood coming from the catheter site.  The catheter site feels warm to the touch.  You have pus or a bad smell coming from the catheter site.  You have a fever or chills. Get help right away if:  You feel weak.  You have trouble balancing.  You have trouble moving your arms or legs.  You have problems with your speech or vision.  You can no longer feel a vibration or buzz when you put your fingers over your dialysis fistula.  The limb that was used for the procedure: ? Swells. ? Is painful. ? Is cold. ? Is discolored, such as blue or pale white.  You have chest pain or shortness of breath. Summary  After a dialysis fistulogram, it is common to have a small amount of discomfort or bruising in the area where the small, thin tube (catheter) was placed.  Rest at home on the day after your procedure. Return to your normal activities as told by your health care  provider.  Take over-the-counter and prescription medicines only as told by your health care provider.  Follow instructions from your health care provider about how to take care of the site where the catheter was inserted.  Keep all follow-up visits as told by your health care provider. This information is not intended to replace advice given to you by your health care provider. Make sure you discuss any questions you have with your health care provider. Document Revised: 08/16/2017 Document Reviewed: 08/16/2017 Elsevier Patient Education  2020 [employer].

## 2019-08-10 NOTE — Procedures (Signed)
  Procedure: Declot RUA graft, Venous PTA   EBL:   minimal Complications:  none immediate  See full dictation in [employer].  Dillard Cannon MD Main # [phone] Pager  [phone]

## 2019-08-10 NOTE — H&P (Signed)
Chief Complaint: Patient was seen in consultation today for clotted dialysis access  Referring Physician(s): Fran Lowes  Supervising Physician: Arne Cleveland  Patient Status: [facility] - Out-pt  History of Present Illness: Kara Knapp is a 49 y.o. female with past medical history of asthma, HTN, HLD, chronic kidney disease of dialysis via RUE AV graft. Patient presented for dialysis Tuesday and was able to successfully undergo dialysis, when she returned Thursday, her graft could not be accessed. This access has been intervened on several times in the past-- 08/08/18, 12/26/18, and 05/15/19 with successful restoration of flow.  At that time her access was felt to be amenable to future intervention. She presents to IR today for declotting procedure.   Patient assessed at bedside.  States she is sleepy, but otherwise feeling well.  Plans to go to dialysis today and tomorrow.  She has been NPO.   Past Medical History:  Diagnosis Date  . Anemia of chronic disease   . Asthma   . Blind left eye   . Bronchitis   . Cataract   . Cholecystitis, acute 05/26/2013   Status post cholecystectomy  . Chronic abdominal pain   . Chronic diarrhea   . Depression   . Diabetic foot ulcer ([place]) 03/01/2015  . Diastolic heart failure ([place])   . ESRD on hemodialysis ([place])    Started diaylsis 12/29/15  . Essential hypertension   . Fibroids   . Glaucoma   . History of blood transfusion   . History of pneumonia   . Hyperlipidemia   . Insulin-dependent diabetes mellitus with retinopathy   . Liver fibrosis    Negative Hep B surface antigen, negative Hep C antibody Feb 2018 (see scanned in labs).  . Neuropathy   . Osteomyelitis ([place])    Toe on left foot    Past Surgical History:  Procedure Laterality Date  . A/V SHUNTOGRAM N/A 10/25/2016   Procedure: A/V Shuntogram - Right Arm;  Surgeon: Waynetta Sandy, MD;  Location: [place] [facility];  Service: Cardiovascular;  Laterality: N/A;    . A/V SHUNTOGRAM N/A 03/05/2018   Procedure: A/V SHUNTOGRAM - Right Arm;  Surgeon: Waynetta Sandy, MD;  Location: [place] [facility];  Service: Cardiovascular;  Laterality: N/A;  . AV FISTULA PLACEMENT Right 10/17/2015   Procedure: INSERTION OF ARTERIOVENOUS GORE-TEX GRAFT RIGHT UPPER ARM WITH ACUSEAL;  Surgeon: Conrad Humansville, MD;  Location: [place];  Service: Vascular;  Laterality: Right;  . CATARACT EXTRACTION W/ INTRAOCULAR LENS IMPLANT Bilateral   . CESAREAN SECTION    . CHOLECYSTECTOMY N/A 05/25/2013   Procedure: LAPAROSCOPIC CHOLECYSTECTOMY;  Surgeon: Jamesetta So, MD;  Location: [facility];  Service: General;  Laterality: N/A;  . COLONOSCOPY WITH PROPOFOL N/A 10/02/2016   two 3 to 5 mm polyps in the descending colon, three 2 to 3 mm polyps in the rectum, random colon biopsies, rectal bleeding due to internal hemorrhoids, friability with no bleeding at the anus status post biopsy.  Surgical pathology found the polyps to be a mix of hyperplastic and tubular adenoma, random colon biopsies to be benign colonic mucosa, and the anal biopsies to be anal skin tag.   Marland Kitchen ESOPHAGOGASTRODUODENOSCOPY (EGD) WITH PROPOFOL N/A 10/02/2016   mucosal nodule in the esophagus status post biopsy, moderate gastritis status post biopsy, mild duodenitis. esophageal biopsy to be benign, gastric biopsies to be gastritis due to aspirin use, and duodenal biopsies to be duodenitis due to aspirin use  . EYE SURGERY Bilateral   .  IR THROMBECTOMY AV FISTULA W/THROMBOLYSIS/PTA INC/SHUNT/IMG RIGHT Right 12/26/2018  . IR THROMBECTOMY AV FISTULA W/THROMBOLYSIS/PTA/STENT INC/SHUNT/IMG RT Right 08/08/2018  . IR THROMBECTOMY AV FISTULA W/THROMBOLYSIS/PTA/STENT INC/SHUNT/IMG RT Right 05/15/2019  . IR US GUIDE VASC ACCESS RIGHT  08/08/2018  . IR US GUIDE VASC ACCESS RIGHT  12/26/2018  . IR US GUIDE VASC ACCESS RIGHT  05/15/2019  . PARS PLANA VITRECTOMY Left 11/24/2014   Procedure: PARS PLANA VITRECTOMY WITH 25 GAUGE;  Surgeon: Hurman Horn, MD;  Location: [place];  Service: Ophthalmology;  Laterality: Left;  . PERIPHERAL VASCULAR BALLOON ANGIOPLASTY Right 03/05/2018   Procedure: PERIPHERAL VASCULAR BALLOON ANGIOPLASTY;  Surgeon: Waynetta Sandy, MD;  Location: [place] [facility];  Service: Cardiovascular;  Laterality: Right;  arm fistula  . PERIPHERAL VASCULAR CATHETERIZATION N/A 04/28/2015   Procedure: Bilateral Upper Extremity Venography;  Surgeon: Conrad , MD;  Location: [place] [facility];  Service: Cardiovascular;  Laterality: N/A;  . PHOTOCOAGULATION WITH LASER Left 11/24/2014   Procedure: PHOTOCOAGULATION WITH LASER;  Surgeon: Hurman Horn, MD;  Location: [place];  Service: Ophthalmology;  Laterality: Left;  with insertion of silicone oil  . SAVORY DILATION N/A 10/02/2016   Procedure: SAVORY DILATION;  Surgeon: Danie Binder, MD;  [facility];  Service: Endoscopy;  Laterality: N/A;  . TUBAL LIGATION      Allergies: Bactrim [sulfamethoxazole-trimethoprim] and Prednisone  Medications: Prior to Admission medications   Medication Sig Start Date End Date Taking? Authorizing Provider  azelastine (ASTELIN) 0.1 % nasal spray Place 1 spray into both nostrils 2 (two) times daily. Use in each nostril as directed Patient taking differently: 1 spray as needed. Use in each nostril as directed 08/29/18  Yes Dettinger, Fransisca Kaufmann, MD  brimonidine-timolol (COMBIGAN) 0.2-0.5 % ophthalmic solution Place 1 drop into both eyes daily.    Yes [provider]  cholecalciferol (VITAMIN D) 1000 units tablet Take 1,000 Units by mouth daily.    Yes [provider]  diclofenac sodium (VOLTAREN) 1 % GEL Apply 2 g topically 4 (four) times daily. Patient taking differently: Apply 2 g topically 4 (four) times daily as needed (pain).  11/14/18  Yes Gottschalk, Leatrice Jewels M, DO  gabapentin (NEURONTIN) 100 MG capsule Take 1 capsule (100 mg total) by mouth 3 (three) times daily. 03/18/19  Yes Dettinger, Fransisca Kaufmann, MD    Insulin Glargine (LANTUS SOLOSTAR) 100 UNIT/ML Solostar Pen Inject 18-50 Units into the skin at bedtime. Patient taking differently: Inject 20 Units into the skin at bedtime.  08/29/18  Yes Dettinger, Fransisca Kaufmann, MD  lidocaine-prilocaine (EMLA) cream Apply 1 application topically daily as needed (arm port).   Yes [provider]  linagliptin (TRADJENTA) 5 MG TABS tablet Take 1 tablet (5 mg total) by mouth daily. 03/18/19  Yes Dettinger, Fransisca Kaufmann, MD  multivitamin (RENA-VIT) TABS tablet Take 1 tablet by mouth daily. 04/03/19  Yes [provider]  pantoprazole (PROTONIX) 40 MG tablet TAKE 1 BY MOUTH DAILY 30 minutes before breakfast Patient taking differently: Take 40 mg by mouth daily.  02/02/19  Yes Dettinger, Fransisca Kaufmann, MD  Probiotic Product (CULTURELLE PROBIOTICS PO) Take 1 capsule by mouth daily.    Yes [provider]  sertraline (ZOLOFT) 100 MG tablet Take 1.5 tablets (150 mg total) by mouth daily. Patient taking differently: Take 150 mg by mouth at bedtime.  06/15/19  Yes Hisada, Elie Goody, MD  sevelamer carbonate (RENVELA) 800 MG tablet Take 2,400 mg by mouth See admin instructions. Take 2474m by  mouth three times daily with means and 832m with snacks   Yes [provider]  traZODone (DESYREL) 50 MG tablet Take 0.5-1 tablets (25-50 mg total) by mouth at bedtime as needed. for sleep Patient taking differently: Take 25 mg by mouth at bedtime as needed for sleep.  03/18/19  Yes Dettinger, JFransisca Kaufmann MD  Continuous Blood Gluc Sensor (FREESTYLE LIBRE 14 DAY SENSOR) MISC Inject 1 each into the skin every 14 (fourteen) days. Use as directed. 11/27/17   NCassandria Anger MD  glucose blood (PRODIGY NO CODING BLOOD GLUC) test strip USE TO CHECK BLOOD SUGAR TWICE DAILY. Dx E11.22 02/03/19   Dettinger, JFransisca Kaufmann MD  Insulin Pen Needle (TRUEPLUS PEN NEEDLES) 31G X 5 MM MISC Use daily with insulin Dx E11.22 03/09/19   Dettinger, JFransisca Kaufmann MD  PRODIGY TWIST TOP LANCETS 28G MISC USE  TO CHECK BLOOD SUGAR UP TO FOUR TIMES DAILY. 03/13/17   BTimmothy Euler MD  traMADol (ULTRAM) 50 MG tablet Take 1 tablet (50 mg total) by mouth every 8 (eight) hours as needed. Patient taking differently: Take 50 mg by mouth every 8 (eight) hours as needed for moderate pain.  03/18/19   Dettinger, JFransisca Kaufmann MD     Family History  Problem Relation Age of Onset  . COPD Mother   . Cancer Father   . Lymphoma Father   . Diabetes Sister   . Deep vein thrombosis Sister   . Diabetes Brother   . Hyperlipidemia Brother   . Hypertension Brother   . Mental retardation Sister   . Alcohol abuse Paternal Grandmother   . Colon cancer Neg Hx   . Liver disease Neg Hx     Social History   Socioeconomic History  . Marital status: Single    Spouse name: Not on file  . Number of children: 5  . Years of education: GED  . Highest education level: Not on file  Occupational History  . Not on file  Tobacco Use  . Smoking status: Current Every Day Smoker    Packs/day: 1.50    Years: 23.00    Pack years: 34.50    Types: Cigarettes  . Smokeless tobacco: Never Used  . Tobacco comment: one pack daily  Substance and Sexual Activity  . Alcohol use: No    Alcohol/week: 0.0 standard drinks  . Drug use: No    Comment: Sober for 8 years  . Sexual activity: Not Currently    Birth control/protection: Surgical  Other Topics Concern  . Not on file  Social History Narrative  . Not on file   Social Determinants of Health   Financial Resource Strain:   . Difficulty of Paying Living Expenses: Not on file  Food Insecurity:   . Worried About RCharity fundraiserin the Last Year: Not on file  . Ran Out of Food in the Last Year: Not on file  Transportation Needs:   . Lack of Transportation (Medical): Not on file  . Lack of Transportation (Non-Medical): Not on file  Physical Activity:   . Days of Exercise per Week: Not on file  . Minutes of Exercise per Session: Not on file  Stress:   . Feeling of  Stress : Not on file  Social Connections:   . Frequency of Communication with Friends and Family: Not on file  . Frequency of Social Gatherings with Friends and Family: Not on file  . Attends Religious Services: Not on file  . Active Member  of Clubs or Organizations: Not on file  . Attends Archivist Meetings: Not on file  . Marital Status: Not on file     Review of Systems: A 12 point ROS discussed and pertinent positives are indicated in the HPI above.  All other systems are negative.  Review of Systems  Constitutional: Negative for fatigue and fever.  Respiratory: Negative for cough and shortness of breath.   Cardiovascular: Negative for chest pain.  Gastrointestinal: Negative for abdominal pain.  Musculoskeletal: Negative for back pain.  Psychiatric/Behavioral: Negative for behavioral problems and confusion.    Vital Signs: BP (!) 210/87   Pulse 73   Temp 98 F (36.7 C) (Skin)   Resp 15   Ht 5' 3"  (1.6 m)   Wt 180 lb (81.6 kg)   LMP 05/05/2015   SpO2 99%   BMI 31.89 kg/m   Physical Exam Vitals and nursing note reviewed.  Constitutional:      Appearance: Normal appearance.  Cardiovascular:     Rate and Rhythm: Normal rate and regular rhythm.  Pulmonary:     Effort: Pulmonary effort is normal. No respiratory distress.     Breath sounds: Wheezing (expiratory) present.  Abdominal:     General: Abdomen is flat.     Palpations: Abdomen is soft.  Musculoskeletal:     Cervical back: Normal range of motion and neck supple.     Comments: RUE graft without bruit or thrill.   Skin:    General: Skin is warm and dry.  Neurological:     General: No focal deficit present.     Mental Status: She is alert and oriented to person, place, and time. Mental status is at baseline.  Psychiatric:        Mood and Affect: Mood normal.        Behavior: Behavior normal.        Thought Content: Thought content normal.        Judgment: Judgment normal.      MD  Evaluation Airway: WNL Heart: WNL Abdomen: WNL Chest/ Lungs: WNL ASA  Classification: 3 Mallampati/Airway Score: One   Imaging: No results found.  Labs:  CBC: Recent Labs    05/04/19 1145 05/08/19 1423 05/15/19 1215 08/10/19 0809  WBC 8.0 7.1 7.2 7.4  HGB 10.6* 10.6* 11.0* 10.4*  HCT 33.5* 31.5* 34.6* 32.6*  PLT 186 169 189 172    COAGS: Recent Labs    05/08/19 1423 05/15/19 1215 08/10/19 0809  INR 0.9 1.0 1.0    BMP: Recent Labs    10/01/18 1115 12/26/18 1157 05/15/19 1348 08/10/19 0809  NA 137 137 135 137  K 4.7 4.8 5.9* 5.3*  CL 98 99 99 104  CO2 25 24 22  21*  GLUCOSE 139* 142* 108* 130*  BUN 27* 53* 54* 83*  CALCIUM 8.6* 9.2 9.0 8.9  CREATININE 5.37* 7.36* 8.77* 10.80*  GFRNONAA 9* 6* 5* 4*  GFRAA 10* 7* 6* 4*    LIVER FUNCTION TESTS: Recent Labs    08/29/18 1406 10/01/18 1115 05/08/19 1423  BILITOT 0.3 0.3 0.3  AST 8 17 15   ALT 12 15 13   ALKPHOS 289* 276*  --   PROT 7.2 8.1 7.0  ALBUMIN 4.2 4.1  --     TUMOR MARKERS: No results for input(s): AFPTM, CEA, CA199, CHROMGRNA in the last 8760 hours.  Assessment and Plan: Patient with past medical history of end stage renal failure on HD via RUE AV graft presents with complaint of  clotted access.  IR consulted for declotting procedure at the request of Dr. Hinda Lenis. Case reviewed by Dr. Earleen Newport who approves patient for procedure.  Patient presents today in their usual state of health.  She has been NPO and is not currently on blood thinners.  Potassium 5.3.  Plans for dialysis later today at her home dialysis center.   Risks and benefits discussed with the patient including, but not limited to bleeding, infection, vascular injury, pulmonary embolism, need for tunneled HD catheter placement or even death.  All of the patient's questions were answered, patient is agreeable to proceed. Consent signed and in chart.  Thank you for this interesting consult.  I greatly enjoyed meeting Evalyne  A Gonterman and look forward to participating in their care.  A copy of this report was sent to the requesting provider on this date.  Electronically Signed: Docia Barrier, PA 08/10/2019, 9:48 AM   I spent a total of  30 Minutes   in face to face in clinical consultation, greater than 50% of which was counseling/coordinating care for renal failure, RUE clotted AV graft.

## 2019-08-11 ENCOUNTER — Encounter (HOSPITAL_COMMUNITY): Payer: Self-pay

## 2019-08-11 ENCOUNTER — Other Ambulatory Visit (HOSPITAL_COMMUNITY): Payer: Self-pay | Admitting: Nephrology

## 2019-08-11 ENCOUNTER — Ambulatory Visit (HOSPITAL_COMMUNITY): Payer: Medicare Other | Admitting: Psychiatry

## 2019-08-11 DIAGNOSIS — Z992 Dependence on renal dialysis: Secondary | ICD-10-CM | POA: Diagnosis not present

## 2019-08-11 DIAGNOSIS — N186 End stage renal disease: Secondary | ICD-10-CM | POA: Diagnosis not present

## 2019-08-11 DIAGNOSIS — D631 Anemia in chronic kidney disease: Secondary | ICD-10-CM | POA: Diagnosis not present

## 2019-08-11 DIAGNOSIS — N250 Renal osteodystrophy: Secondary | ICD-10-CM | POA: Diagnosis not present

## 2019-08-11 DIAGNOSIS — D509 Iron deficiency anemia, unspecified: Secondary | ICD-10-CM | POA: Diagnosis not present

## 2019-08-11 DIAGNOSIS — T82868A Thrombosis of vascular prosthetic devices, implants and grafts, initial encounter: Secondary | ICD-10-CM | POA: Diagnosis not present

## 2019-08-13 DIAGNOSIS — N250 Renal osteodystrophy: Secondary | ICD-10-CM | POA: Diagnosis not present

## 2019-08-13 DIAGNOSIS — D631 Anemia in chronic kidney disease: Secondary | ICD-10-CM | POA: Diagnosis not present

## 2019-08-13 DIAGNOSIS — D509 Iron deficiency anemia, unspecified: Secondary | ICD-10-CM | POA: Diagnosis not present

## 2019-08-13 DIAGNOSIS — T82868A Thrombosis of vascular prosthetic devices, implants and grafts, initial encounter: Secondary | ICD-10-CM | POA: Diagnosis not present

## 2019-08-13 DIAGNOSIS — Z992 Dependence on renal dialysis: Secondary | ICD-10-CM | POA: Diagnosis not present

## 2019-08-13 DIAGNOSIS — N186 End stage renal disease: Secondary | ICD-10-CM | POA: Diagnosis not present

## 2019-08-15 DIAGNOSIS — N186 End stage renal disease: Secondary | ICD-10-CM | POA: Diagnosis not present

## 2019-08-15 DIAGNOSIS — D631 Anemia in chronic kidney disease: Secondary | ICD-10-CM | POA: Diagnosis not present

## 2019-08-15 DIAGNOSIS — Z992 Dependence on renal dialysis: Secondary | ICD-10-CM | POA: Diagnosis not present

## 2019-08-15 DIAGNOSIS — T82868A Thrombosis of vascular prosthetic devices, implants and grafts, initial encounter: Secondary | ICD-10-CM | POA: Diagnosis not present

## 2019-08-15 DIAGNOSIS — N250 Renal osteodystrophy: Secondary | ICD-10-CM | POA: Diagnosis not present

## 2019-08-15 DIAGNOSIS — D509 Iron deficiency anemia, unspecified: Secondary | ICD-10-CM | POA: Diagnosis not present

## 2019-08-18 DIAGNOSIS — T82868A Thrombosis of vascular prosthetic devices, implants and grafts, initial encounter: Secondary | ICD-10-CM | POA: Diagnosis not present

## 2019-08-18 DIAGNOSIS — D509 Iron deficiency anemia, unspecified: Secondary | ICD-10-CM | POA: Diagnosis not present

## 2019-08-18 DIAGNOSIS — D631 Anemia in chronic kidney disease: Secondary | ICD-10-CM | POA: Diagnosis not present

## 2019-08-18 DIAGNOSIS — N186 End stage renal disease: Secondary | ICD-10-CM | POA: Diagnosis not present

## 2019-08-18 DIAGNOSIS — Z992 Dependence on renal dialysis: Secondary | ICD-10-CM | POA: Diagnosis not present

## 2019-08-18 DIAGNOSIS — N250 Renal osteodystrophy: Secondary | ICD-10-CM | POA: Diagnosis not present

## 2019-08-20 DIAGNOSIS — T82868A Thrombosis of vascular prosthetic devices, implants and grafts, initial encounter: Secondary | ICD-10-CM | POA: Diagnosis not present

## 2019-08-20 DIAGNOSIS — N186 End stage renal disease: Secondary | ICD-10-CM | POA: Diagnosis not present

## 2019-08-20 DIAGNOSIS — N250 Renal osteodystrophy: Secondary | ICD-10-CM | POA: Diagnosis not present

## 2019-08-20 DIAGNOSIS — D509 Iron deficiency anemia, unspecified: Secondary | ICD-10-CM | POA: Diagnosis not present

## 2019-08-20 DIAGNOSIS — Z992 Dependence on renal dialysis: Secondary | ICD-10-CM | POA: Diagnosis not present

## 2019-08-20 DIAGNOSIS — D631 Anemia in chronic kidney disease: Secondary | ICD-10-CM | POA: Diagnosis not present

## 2019-08-21 ENCOUNTER — Other Ambulatory Visit: Payer: Self-pay

## 2019-08-21 ENCOUNTER — Encounter: Payer: Self-pay | Admitting: Psychiatry

## 2019-08-21 ENCOUNTER — Ambulatory Visit (INDEPENDENT_AMBULATORY_CARE_PROVIDER_SITE_OTHER): Payer: Medicare Other | Admitting: Psychiatry

## 2019-08-21 DIAGNOSIS — F3341 Major depressive disorder, recurrent, in partial remission: Secondary | ICD-10-CM | POA: Diagnosis not present

## 2019-08-21 MED ORDER — SERTRALINE HCL 100 MG PO TABS: 150.0000 mg | ORAL_TABLET | Freq: Every day | ORAL | 0 refills | Status: DC

## 2019-08-21 MED ORDER — ARIPIPRAZOLE 2 MG PO TABS: 2.0000 mg | ORAL_TABLET | Freq: Every day | ORAL | 0 refills | Status: DC

## 2019-08-21 NOTE — Progress Notes (Signed)
[facility] MD OP Progress Note  I connected with  Kara Knapp on 08/21/19 by a video enabled telemedicine application and verified that I am speaking with the correct person using two identifiers.   I discussed the limitations of evaluation and management by telemedicine. The patient expressed understanding and agreed to proceed.    08/21/2019 9:03 AM Kara Knapp  MRN:  [id number]  Chief Complaint: " I have mood swings."  HPI: Kara Knapp is a 49 y.o. year old female with a history of depression, cocaine use disorder in sustained remission, alcohol use disorder in sustained remission,ESRD,diastolic heart failure, hypertension, who was seen for follow-up via telemedicine.   Patient reported that although Zoloft was helpful she still continues to have mood swings.  When asked to elaborate she stated she feels easily irritated and angry and she tends to lash out.  She is able to sleep fine for the most part.  She informed her dialysis sessions 3 times a week are going well.  She reported she is kind of stressed out because of different people in her life.  She has taken Celexa and Depakote in the past and does not recall taking any other medications. She denied any suicidal or homicidal ideations.  Visit Diagnosis:    ICD-10-CM   1. MDD (major depressive disorder), recurrent, in partial remission ([place])  F33.41     Past Psychiatric History: Mood disorder-depression versus bipolar disorder.  Past Medical History:  Past Medical History:  Diagnosis Date  . Anemia of chronic disease   . Asthma   . Blind left eye   . Bronchitis   . Cataract   . Cholecystitis, acute 05/26/2013   Status post cholecystectomy  . Chronic abdominal pain   . Chronic diarrhea   . Depression   . Diabetic foot ulcer ([place]) 03/01/2015  . Diastolic heart failure ([place])   . ESRD on hemodialysis ([place])    Started diaylsis 12/29/15  . Essential hypertension   . Fibroids   . Glaucoma   . History of blood  transfusion   . History of pneumonia   . Hyperlipidemia   . Insulin-dependent diabetes mellitus with retinopathy   . Liver fibrosis    Negative Hep B surface antigen, negative Hep C antibody Feb 2018 (see scanned in labs).  . Neuropathy   . Osteomyelitis ([place])    Toe on left foot    Past Surgical History:  Procedure Laterality Date  . A/V SHUNTOGRAM N/A 10/25/2016   Procedure: A/V Shuntogram - Right Arm;  Surgeon: Waynetta Sandy, MD;  Location: [facility];  Service: Cardiovascular;  Laterality: N/A;  . A/V SHUNTOGRAM N/A 03/05/2018   Procedure: A/V SHUNTOGRAM - Right Arm;  Surgeon: Waynetta Sandy, MD;  Location: [facility];  Service: Cardiovascular;  Laterality: N/A;  . AV FISTULA PLACEMENT Right 10/17/2015   Procedure: INSERTION OF ARTERIOVENOUS GORE-TEX GRAFT RIGHT UPPER ARM WITH ACUSEAL;  Surgeon: Conrad Loretto, MD;  Location: [facility];  Service: Vascular;  Laterality: Right;  . CATARACT EXTRACTION W/ INTRAOCULAR LENS IMPLANT Bilateral   . CESAREAN SECTION    . CHOLECYSTECTOMY N/A 05/25/2013   Procedure: LAPAROSCOPIC CHOLECYSTECTOMY;  Surgeon: Jamesetta So, MD;  Location: [facility];  Service: General;  Laterality: N/A;  . COLONOSCOPY WITH PROPOFOL N/A 10/02/2016   two 3 to 5 mm polyps in the descending colon, three 2 to 3 mm polyps in the rectum, random colon biopsies, rectal bleeding due to internal hemorrhoids, friability  with no bleeding at the anus status post biopsy.  Surgical pathology found the polyps to be a mix of hyperplastic and tubular adenoma, random colon biopsies to be benign colonic mucosa, and the anal biopsies to be anal skin tag.   Marland Kitchen ESOPHAGOGASTRODUODENOSCOPY (EGD) WITH PROPOFOL N/A 10/02/2016   mucosal nodule in the esophagus status post biopsy, moderate gastritis status post biopsy, mild duodenitis. esophageal biopsy to be benign, gastric biopsies to be gastritis due to aspirin use, and duodenal biopsies to be duodenitis due to aspirin use  .  EYE SURGERY Bilateral   . IR THROMBECTOMY AV FISTULA W/THROMBOLYSIS/PTA INC/SHUNT/IMG RIGHT Right 12/26/2018  . IR THROMBECTOMY AV FISTULA W/THROMBOLYSIS/PTA INC/SHUNT/IMG RIGHT Right 08/10/2019  . IR THROMBECTOMY AV FISTULA W/THROMBOLYSIS/PTA/STENT INC/SHUNT/IMG RT Right 08/08/2018  . IR THROMBECTOMY AV FISTULA W/THROMBOLYSIS/PTA/STENT INC/SHUNT/IMG RT Right 05/15/2019  . IR US GUIDE VASC ACCESS RIGHT  08/08/2018  . IR US GUIDE VASC ACCESS RIGHT  12/26/2018  . IR US GUIDE VASC ACCESS RIGHT  05/15/2019  . IR US GUIDE VASC ACCESS RIGHT  08/10/2019  . PARS PLANA VITRECTOMY Left 11/24/2014   Procedure: PARS PLANA VITRECTOMY WITH 25 GAUGE;  Surgeon: Hurman Horn, MD;  Location: [facility];  Service: Ophthalmology;  Laterality: Left;  . PERIPHERAL VASCULAR BALLOON ANGIOPLASTY Right 03/05/2018   Procedure: PERIPHERAL VASCULAR BALLOON ANGIOPLASTY;  Surgeon: Waynetta Sandy, MD;  Location: [facility];  Service: Cardiovascular;  Laterality: Right;  arm fistula  . PERIPHERAL VASCULAR CATHETERIZATION N/A 04/28/2015   Procedure: Bilateral Upper Extremity Venography;  Surgeon: Conrad Loco, MD;  Location: [facility];  Service: Cardiovascular;  Laterality: N/A;  . PHOTOCOAGULATION WITH LASER Left 11/24/2014   Procedure: PHOTOCOAGULATION WITH LASER;  Surgeon: Hurman Horn, MD;  Location: [facility];  Service: Ophthalmology;  Laterality: Left;  with insertion of silicone oil  . SAVORY DILATION N/A 10/02/2016   Procedure: SAVORY DILATION;  Surgeon: Danie Binder, MD;  [facility];  Service: Endoscopy;  Laterality: N/A;  . TUBAL LIGATION      Family Psychiatric History: see below  Family History:  Family History  Problem Relation Age of Onset  . COPD Mother   . Cancer Father   . Lymphoma Father   . Diabetes Sister   . Deep vein thrombosis Sister   . Diabetes Brother   . Hyperlipidemia Brother   . Hypertension Brother   . Mental retardation Sister   . Alcohol abuse Paternal  Grandmother   . Colon cancer Neg Hx   . Liver disease Neg Hx     Social History:  Social History   Socioeconomic History  . Marital status: Single    Spouse name: Not on file  . Number of children: 5  . Years of education: GED  . Highest education level: Not on file  Occupational History  . Not on file  Tobacco Use  . Smoking status: Current Every Day Smoker    Packs/day: 1.50    Years: 23.00    Pack years: 34.50    Types: Cigarettes  . Smokeless tobacco: Never Used  . Tobacco comment: one pack daily  Substance and Sexual Activity  . Alcohol use: No    Alcohol/week: 0.0 standard drinks  . Drug use: No    Comment: Sober for 8 years  . Sexual activity: Not Currently    Birth control/protection: Surgical  Other Topics Concern  . Not on file  Social History Narrative  . Not on file  Social Determinants of Health   Financial Resource Strain:   . Difficulty of Paying Living Expenses: Not on file  Food Insecurity:   . Worried About Charity fundraiser in the Last Year: Not on file  . Ran Out of Food in the Last Year: Not on file  Transportation Needs:   . Lack of Transportation (Medical): Not on file  . Lack of Transportation (Non-Medical): Not on file  Physical Activity:   . Days of Exercise per Week: Not on file  . Minutes of Exercise per Session: Not on file  Stress:   . Feeling of Stress : Not on file  Social Connections:   . Frequency of Communication with Friends and Family: Not on file  . Frequency of Social Gatherings with Friends and Family: Not on file  . Attends Religious Services: Not on file  . Active Member of Clubs or Organizations: Not on file  . Attends Archivist Meetings: Not on file  . Marital Status: Not on file    Allergies:  Allergies  Allergen Reactions  . Bactrim [Sulfamethoxazole-Trimethoprim] Nausea And Vomiting  . Prednisone Other (See Comments)    "I was wide open and couldn't eat" per pt.     Metabolic Disorder  Labs: Lab Results  Component Value Date   HGBA1C 7.2 (H) 08/29/2018   MPG 243 01/21/2015   MPG 272 (H) 05/22/2013   No results found for: PROLACTIN Lab Results  Component Value Date   CHOL 222 (H) 05/14/2018   TRIG 227 (H) 05/14/2018   HDL 65 05/14/2018   CHOLHDL 3.4 05/14/2018   LDLCALC 112 (H) 05/14/2018   LDLCALC 92 02/10/2018   Lab Results  Component Value Date   TSH 4.668 (H) 01/21/2015    Therapeutic Level Labs: No results found for: LITHIUM No results found for: VALPROATE No components found for:  CBMZ  Current Medications: Current Outpatient Medications  Medication Sig Dispense Refill  . azelastine (ASTELIN) 0.1 % nasal spray Place 1 spray into both nostrils 2 (two) times daily. Use in each nostril as directed (Patient taking differently: 1 spray as needed. Use in each nostril as directed) 30 mL 12  . brimonidine-timolol (COMBIGAN) 0.2-0.5 % ophthalmic solution Place 1 drop into both eyes daily.     . cholecalciferol (VITAMIN D) 1000 units tablet Take 1,000 Units by mouth daily.     . clindamycin (CLEOCIN) 300 MG capsule     . Continuous Blood Gluc Sensor (FREESTYLE LIBRE 14 DAY SENSOR) MISC Inject 1 each into the skin every 14 (fourteen) days. Use as directed. 2 each 2  . cyclobenzaprine (FLEXERIL) 5 MG tablet Take 1 tablet (5 mg total) by mouth 3 (three) times daily as needed for muscle spasms. 60 tablet 3  . diclofenac sodium (VOLTAREN) 1 % GEL Apply 2 g topically 4 (four) times daily. (Patient taking differently: Apply 2 g topically 4 (four) times daily as needed (pain). ) 200 g 3  . doxycycline (VIBRA-TABS) 100 MG tablet Take 1 tablet (100 mg total) by mouth 2 (two) times daily. 20 tablet 0  . gabapentin (NEURONTIN) 100 MG capsule TAKE ONE CAPSULE BY MOUTH THREE TIMES DAILY. 90 capsule 0  . glucose blood (PRODIGY NO CODING BLOOD GLUC) test strip USE TO CHECK BLOOD SUGAR TWICE DAILY. Dx E11.22 200 each 3  . Insulin Glargine (LANTUS SOLOSTAR) 100 UNIT/ML Solostar  Pen Inject 18-50 Units into the skin at bedtime. (Patient taking differently: Inject 20 Units into the skin at bedtime. )  5 pen 11  . Insulin Pen Needle (TRUEPLUS PEN NEEDLES) 31G X 5 MM MISC Use daily with insulin Dx E11.22 100 each 3  . Lactobacillus (ACIDOPHILUS PROBIOTIC) CAPS Take 1 capsule by mouth daily. 90 capsule 3  . lidocaine-prilocaine (EMLA) cream Apply 1 application topically daily as needed (arm port).    Marland Kitchen linagliptin (TRADJENTA) 5 MG TABS tablet Take 1 tablet (5 mg total) by mouth daily. 90 tablet 3  . multivitamin (RENA-VIT) TABS tablet Take 1 tablet by mouth daily.    . pantoprazole (PROTONIX) 40 MG tablet TAKE 1 BY MOUTH DAILY 30 minutes before breakfast (Patient taking differently: Take 40 mg by mouth daily. ) 90 tablet 3  . Probiotic Product (CULTURELLE PROBIOTICS PO) Take 1 capsule by mouth daily.     Marland Kitchen PRODIGY TWIST TOP LANCETS 28G MISC USE TO CHECK BLOOD SUGAR UP TO FOUR TIMES DAILY. 100 each 1  . sertraline (ZOLOFT) 100 MG tablet Take 1.5 tablets (150 mg total) by mouth daily. (Patient taking differently: Take 150 mg by mouth at bedtime. ) 135 tablet 1  . sevelamer carbonate (RENVELA) 800 MG tablet Take 2,400 mg by mouth See admin instructions. Take 2454m by mouth three times daily with means and 8078mwith snacks    . traMADol (ULTRAM) 50 MG tablet Take 1 tablet (50 mg total) by mouth every 8 (eight) hours as needed. (Patient taking differently: Take 50 mg by mouth every 8 (eight) hours as needed for moderate pain. ) 30 tablet 0  . traZODone (DESYREL) 50 MG tablet Take 0.5-1 tablets (25-50 mg total) by mouth at bedtime as needed. for sleep (Patient taking differently: Take 25 mg by mouth at bedtime as needed for sleep. ) 90 tablet 3  . vitamin E (VITAMIN E) 400 UNIT capsule Take 1 capsule (400 Units total) by mouth daily. 90 capsule 1   No current facility-administered medications for this visit.     Musculoskeletal: Strength & Muscle Tone: unable to assess due to  telemed visit Gait & Station: unable to assess due to telemed visit Patient leans: unable to assess due to telemed visit  Psychiatric Specialty Exam: Review of Systems  Last menstrual period 05/05/2015.There is no height or weight on file to calculate BMI.  General Appearance: Fairly Groomed  Eye Contact:  Good  Speech:  Clear and Coherent and Normal Rate  Volume:  Normal  Mood:  Slightly irritable  Affect:  Congruent  Thought Process:  Goal Directed, Linear and Descriptions of Associations: Intact  Orientation:  Full (Time, Place, and Person)  Thought Content: Logical   Suicidal Thoughts:  No  Homicidal Thoughts:  No  Memory:  Recent;   Good Remote;   Good  Judgement:  Fair  Insight:  Fair  Psychomotor Activity:  Normal  Concentration:  Concentration: Good and Attention Span: Good  Recall:  Good  Fund of Knowledge: Good  Language: Good  Akathisia:  Negative  Handed:  Right  AIMS (if indicated): not done  Assets:  Communication Skills Desire for Improvement Financial Resources/Insurance Housing  ADL's:  Intact  Cognition: WNL  Sleep:  Good   Screenings: PHQ2-9     [facility]/2018 [facility] Total Score  0  0  2  2  1  PHQ-9 Total Score  --  --  9  6  --       Assessment and Plan: Patient reported frequent mood swings.  Although there is some concern about bipolar disorder in the past she is currently being managed for MDD.  Will add low-dose Abilify to sertraline to see if that helps her symptoms.  1. MDD (major depressive disorder), recurrent, in partial remission ([facility])  -Continue sertraline (ZOLOFT) 100 MG tablet; Take 1.5 tablets (150 mg total) by mouth  daily.  Dispense: 135 tablet; Refill: 0 -Start ARIPiprazole (ABILIFY) 2 MG tablet; Take 1 tablet (2 mg total) by mouth daily.  Dispense: 90 tablet; Refill: 0   Follow-up in 2 months with Dr. Modesta Messing.   [place] Crane, MD 08/21/2019, 9:03 AM

## 2019-08-25 DIAGNOSIS — Z992 Dependence on renal dialysis: Secondary | ICD-10-CM | POA: Diagnosis not present

## 2019-08-25 DIAGNOSIS — N186 End stage renal disease: Secondary | ICD-10-CM | POA: Diagnosis not present

## 2019-08-25 DIAGNOSIS — D509 Iron deficiency anemia, unspecified: Secondary | ICD-10-CM | POA: Diagnosis not present

## 2019-08-25 DIAGNOSIS — D631 Anemia in chronic kidney disease: Secondary | ICD-10-CM | POA: Diagnosis not present

## 2019-08-25 DIAGNOSIS — T82868A Thrombosis of vascular prosthetic devices, implants and grafts, initial encounter: Secondary | ICD-10-CM | POA: Diagnosis not present

## 2019-08-25 DIAGNOSIS — N250 Renal osteodystrophy: Secondary | ICD-10-CM | POA: Diagnosis not present

## 2019-08-27 DIAGNOSIS — T82868A Thrombosis of vascular prosthetic devices, implants and grafts, initial encounter: Secondary | ICD-10-CM | POA: Diagnosis not present

## 2019-08-27 DIAGNOSIS — Z992 Dependence on renal dialysis: Secondary | ICD-10-CM | POA: Diagnosis not present

## 2019-08-27 DIAGNOSIS — D631 Anemia in chronic kidney disease: Secondary | ICD-10-CM | POA: Diagnosis not present

## 2019-08-27 DIAGNOSIS — N186 End stage renal disease: Secondary | ICD-10-CM | POA: Diagnosis not present

## 2019-08-27 DIAGNOSIS — N250 Renal osteodystrophy: Secondary | ICD-10-CM | POA: Diagnosis not present

## 2019-08-27 DIAGNOSIS — D509 Iron deficiency anemia, unspecified: Secondary | ICD-10-CM | POA: Diagnosis not present

## 2019-08-29 DIAGNOSIS — N186 End stage renal disease: Secondary | ICD-10-CM | POA: Diagnosis not present

## 2019-08-29 DIAGNOSIS — Z992 Dependence on renal dialysis: Secondary | ICD-10-CM | POA: Diagnosis not present

## 2019-08-29 DIAGNOSIS — D631 Anemia in chronic kidney disease: Secondary | ICD-10-CM | POA: Diagnosis not present

## 2019-08-29 DIAGNOSIS — T82868A Thrombosis of vascular prosthetic devices, implants and grafts, initial encounter: Secondary | ICD-10-CM | POA: Diagnosis not present

## 2019-08-29 DIAGNOSIS — D509 Iron deficiency anemia, unspecified: Secondary | ICD-10-CM | POA: Diagnosis not present

## 2019-08-29 DIAGNOSIS — N250 Renal osteodystrophy: Secondary | ICD-10-CM | POA: Diagnosis not present

## 2019-08-30 DIAGNOSIS — Z992 Dependence on renal dialysis: Secondary | ICD-10-CM | POA: Diagnosis not present

## 2019-08-30 DIAGNOSIS — N186 End stage renal disease: Secondary | ICD-10-CM | POA: Diagnosis not present

## 2019-08-31 ENCOUNTER — Other Ambulatory Visit: Payer: Self-pay | Admitting: Family

## 2019-08-31 DIAGNOSIS — M62838 Other muscle spasm: Secondary | ICD-10-CM

## 2019-08-31 DIAGNOSIS — M549 Dorsalgia, unspecified: Secondary | ICD-10-CM

## 2019-09-01 DIAGNOSIS — N250 Renal osteodystrophy: Secondary | ICD-10-CM | POA: Diagnosis not present

## 2019-09-01 DIAGNOSIS — Z992 Dependence on renal dialysis: Secondary | ICD-10-CM | POA: Diagnosis not present

## 2019-09-01 DIAGNOSIS — N186 End stage renal disease: Secondary | ICD-10-CM | POA: Diagnosis not present

## 2019-09-01 DIAGNOSIS — D631 Anemia in chronic kidney disease: Secondary | ICD-10-CM | POA: Diagnosis not present

## 2019-09-02 ENCOUNTER — Ambulatory Visit (INDEPENDENT_AMBULATORY_CARE_PROVIDER_SITE_OTHER): Payer: Medicare Other | Admitting: Gastroenterology

## 2019-09-02 ENCOUNTER — Other Ambulatory Visit: Payer: Self-pay

## 2019-09-02 ENCOUNTER — Encounter: Payer: Self-pay | Admitting: Gastroenterology

## 2019-09-02 DIAGNOSIS — R1319 Other dysphagia: Secondary | ICD-10-CM

## 2019-09-02 DIAGNOSIS — K219 Gastro-esophageal reflux disease without esophagitis: Secondary | ICD-10-CM

## 2019-09-02 DIAGNOSIS — K760 Fatty (change of) liver, not elsewhere classified: Secondary | ICD-10-CM | POA: Diagnosis not present

## 2019-09-02 DIAGNOSIS — K529 Noninfective gastroenteritis and colitis, unspecified: Secondary | ICD-10-CM

## 2019-09-02 DIAGNOSIS — R1310 Dysphagia, unspecified: Secondary | ICD-10-CM | POA: Diagnosis not present

## 2019-09-02 NOTE — Assessment & Plan Note (Signed)
SYMPTOMS FAIRLY WELL CONTROLLED BY DIET MODIFICATION  NO WARNING SIGNS/SYMPTOMS.  CONTINUE TO MONITOR SYMPTOMS. FOLLOW UP IN 6 MOS.

## 2019-09-02 NOTE — Assessment & Plan Note (Signed)
SYMPTOMS CONTROLLED/RESOLVED.  CONTINUE PROTONIX. TAKE 30 MINUTES PRIOR TO BREAKFAST. FOLLOW UP IN 6 MOS.

## 2019-09-02 NOTE — Patient Instructions (Addendum)
EAT TO LIVE AND THINK OF FOOD AS MEDICINE. 75% OF YOUR PLATE SHOULD BE FRUITS/VEGGIES.  To have more energy, and to lose weight:      1. CONTINUE YOUR WEIGHT LOSS EFFORTS. I RECOMMEND YOU READ AND FOLLOW RECOMMENDATIONS BY DR. MARK HYMAN, "10-DAY DETOX DIET".    2. If you must eat bread, EAT EZEKIEL BREAD. IT IS IN THE FROZEN SECTION OF THE GROCERY STORE.    3. DRINK WATER WITH FRUIT OR CUCUMBER ADDED. YOUR URINE SHOULD BE LIGHT YELLOW. AVOID SODA, GATORADE, ENERGY DRINKS, OR DIET SODA.     4. AVOID HIGH FRUCTOSE CORN SYRUP AND CAFFEINE.     5. DO NOT chew SUGAR FREE GUM OR USE ARTIFICIAL SWEETENERS. IF NEEDED USE STEVIA AS A SWEETENER.    6. DO NOT EAT ENRICHED WHEAT FLOUR, PASTA, RICE, OR CEREAL.    7. ONLY EAT WILD CAUGHT SEAFOOD, GRASS FED BEEF OR CHICKEN, PORK FROM PASTURE RAISE PIGS, OR EGGS FROM PASTURE RAISED CHICKENS.    8. PRACTICE CHAIR YOGA or RIDE A STATIONERY BIKE FOR 15-30 MINS 3 OR 4 TIMES A WEEK AND PROGRESS TO HATHA YOGA OVER NEXT 6 MOS.    9. START TAKING VITAMIN B12, AND VITAMIN D3 2000 IU DAILY.   ADDITIONAL SUPPLEMENTS TO DECREASE CRAVING AND SUPPRESS YOUR APPETITE:    1. CINNAMON 500 MG EVERY AM PRIOR TO FIRST MEAL.   **STABILIZES BLOOD GLUCOSE/REDUCES CRAVINGS**    2. CHROMIUM 400-500 MG WITH MEALS TWICE DAILY.    **FAT BURNER**    3. GREEN TEA EXTRACT ONE DAILY.   **FAT BURNER/SUPPRESSES YOUR APPETITE**    4. ALPHA LIPOIC ACID TWICE DAILY.   **NATURAL ANTI-INFLAMMATORY SUPPLEMENT THAT IS AN ALTERNATIVE TO IBUPROFEN OR NAPROXEN**  CONTINUE PROTONIX. TAKE 30 MINUTES PRIOR TO BREAKFAST.  FOLLOW UP IN 6 MOS.

## 2019-09-02 NOTE — Progress Notes (Signed)
Subjective:    Patient ID: Kara Knapp, female    DOB: February 12, 1971, 49 y.o.   MRN: [id number] Dettinger, Fransisca Kaufmann, MD   HPI EATS SLOWLY. UNABLE TO COMPLETE MANOMETRY STUDIES DUE TO TRANSPORTATION LIMITATIONS. APPETITE:GOOD.  WEIGHT UP FROM MAY 2019 149 LBS AND NOW UP TO 187 LBS. NEXT MO GOING TO BUY AN EXERCISE BIKE. ENERGY LEVEL NOT SO GOOD. STILL SMOKING. GETS UP TO DO STUFF AROUND THE HOUSE BACK HURTS. BMs: EVERY DAY. MAY HAVE CONSTIPATION OR DIARRHEA EVERY BLUE MOON. CRAMPING IN BELLY A LOT(LOWER, BETTER AFTER BM?) AND IT MAY START IF SHE'S MOVING AROUND.  PT DENIES FEVER, CHILLS, HEMATOCHEZIA, HEMATEMESIS, nausea, vomiting, melena,  CHEST PAIN, SHORTNESS OF BREATH, CHANGE IN BOWEL IN HABITS, problems swallowing, OR  heartburn or indigestion.  Past Medical History:  Diagnosis Date  . Anemia of chronic disease   . Asthma   . Blind left eye   . Bronchitis   . Cataract   . Cholecystitis, acute 05/26/2013   Status post cholecystectomy  . Chronic abdominal pain   . Chronic diarrhea   . Depression   . Diabetic foot ulcer ([place]) 03/01/2015  . Diastolic heart failure ([place])   . ESRD on hemodialysis ([place])    Started diaylsis 12/29/15  . Essential hypertension   . Fibroids   . Glaucoma   . History of blood transfusion   . History of pneumonia   . Hyperlipidemia   . Insulin-dependent diabetes mellitus with retinopathy   . Liver fibrosis    Negative Hep B surface antigen, negative Hep C antibody Feb 2018 (see scanned in labs).  . Neuropathy   . Osteomyelitis ([place])    Toe on left foot    Past Surgical History:  Procedure Laterality Date  . A/V SHUNTOGRAM N/A 10/25/2016   Procedure: A/V Shuntogram - Right Arm;  Surgeon: Waynetta Sandy, MD;  Location: [place] [facility];  Service: Cardiovascular;  Laterality: N/A;  . A/V SHUNTOGRAM N/A 03/05/2018   Procedure: A/V SHUNTOGRAM - Right Arm;  Surgeon: Waynetta Sandy, MD;  Location: [place] [facility];  Service:  Cardiovascular;  Laterality: N/A;  . AV FISTULA PLACEMENT Right 10/17/2015   Procedure: INSERTION OF ARTERIOVENOUS GORE-TEX GRAFT RIGHT UPPER ARM WITH ACUSEAL;  Surgeon: Conrad [place], MD;  Location: [place];  Service: Vascular;  Laterality: Right;  . CATARACT EXTRACTION W/ INTRAOCULAR LENS IMPLANT Bilateral   . CESAREAN SECTION    . CHOLECYSTECTOMY N/A 05/25/2013   Procedure: LAPAROSCOPIC CHOLECYSTECTOMY;  Surgeon: Jamesetta So, MD;  [facility];  Service: General;  Laterality: N/A;  . COLONOSCOPY WITH PROPOFOL N/A 10/02/2016   two 3 to 5 mm polyps in the descending colon, three 2 to 3 mm polyps in the rectum, random colon biopsies, rectal bleeding due to internal hemorrhoids, friability with no bleeding at the anus status post biopsy.  Surgical pathology found the polyps to be a mix of hyperplastic and tubular adenoma, random colon biopsies to be benign colonic mucosa, and the anal biopsies to be anal skin tag.   Marland Kitchen ESOPHAGOGASTRODUODENOSCOPY (EGD) WITH PROPOFOL N/A 10/02/2016   mucosal nodule in the esophagus status post biopsy, moderate gastritis status post biopsy, mild duodenitis. esophageal biopsy to be benign, gastric biopsies to be gastritis due to aspirin use, and duodenal biopsies to be duodenitis due to aspirin use  . EYE SURGERY Bilateral   . IR THROMBECTOMY AV FISTULA W/THROMBOLYSIS/PTA INC/SHUNT/IMG RIGHT Right 12/26/2018  . IR THROMBECTOMY AV FISTULA W/THROMBOLYSIS/PTA INC/SHUNT/IMG  RIGHT Right 08/10/2019  . IR THROMBECTOMY AV FISTULA W/THROMBOLYSIS/PTA/STENT INC/SHUNT/IMG RT Right 08/08/2018  . IR THROMBECTOMY AV FISTULA W/THROMBOLYSIS/PTA/STENT INC/SHUNT/IMG RT Right 05/15/2019  . IR US GUIDE VASC ACCESS RIGHT  08/08/2018  . IR US GUIDE VASC ACCESS RIGHT  12/26/2018  . IR US GUIDE VASC ACCESS RIGHT  05/15/2019  . IR US GUIDE VASC ACCESS RIGHT  08/10/2019  . PARS PLANA VITRECTOMY Left 11/24/2014   Procedure: PARS PLANA VITRECTOMY WITH 25 GAUGE;  Surgeon: Hurman Horn, MD;  Location: [place];  Service: Ophthalmology;  Laterality: Left;  . PERIPHERAL VASCULAR BALLOON ANGIOPLASTY Right 03/05/2018   Procedure: PERIPHERAL VASCULAR BALLOON ANGIOPLASTY;  Surgeon: Waynetta Sandy, MD;  Location: [facility] [facility];  Service: Cardiovascular;  Laterality: Right;  arm fistula  . PERIPHERAL VASCULAR CATHETERIZATION N/A 04/28/2015   Procedure: Bilateral Upper Extremity Venography;  Surgeon: Conrad [place], MD;  Location: [place] [facility];  Service: Cardiovascular;  Laterality: N/A;  . PHOTOCOAGULATION WITH LASER Left 11/24/2014   Procedure: PHOTOCOAGULATION WITH LASER;  Surgeon: Hurman Horn, MD;  Location: [place];  Service: Ophthalmology;  Laterality: Left;  with insertion of silicone oil  . SAVORY DILATION N/A 10/02/2016   Procedure: SAVORY DILATION;  Surgeon: Danie Binder, MD;  [facility];  Service: Endoscopy;  Laterality: N/A;  . TUBAL LIGATION     Allergies  Allergen Reactions  . Bactrim [Sulfamethoxazole-Trimethoprim] Nausea And Vomiting  . Prednisone Other (See Comments)    "I was wide open and couldn't eat" per pt.     Current Outpatient Medications  Medication Sig    . apixaban 2.5 MG TABS tablet Take 2.5 mg by mouth 2 (two) times daily.    . ARIPiprazole (ABILIFY) 2 MG tablet Take 1 tablet (2 mg total) by mouth daily.    Marland Kitchen azelastine (ASTELIN) 0.1 % nasal spray Place 1 spray into both nostrils 2 (two) times daily. Use in each nostril as directed (Patient taking differently: 1 spray as needed. Use in each nostril as directed)    . brimonidine-timolol (COMBIGAN) 0.2-0.5 % ophthalmic solution Place 1 drop into both eyes daily.     . cholecalciferol (VITAMIN D) 1000 units tablet Take 1,000 Units by mouth daily.     . Continuous Blood Gluc Sensor (FREESTYLE LIBRE 14 DAY SENSOR) MISC Inject 1 each into the skin every 14 (fourteen) days. Use as directed.    . cyclobenzaprine (FLEXERIL) 5 MG tablet TAKE ONE TABLET BY MOUTH THREE TIMES DAILY AS NEEDED FOR MUSCLE  SPASM    . diclofenac sodium (VOLTAREN) 1 % GEL Apply 2 g topically 4 (four) times daily. (Patient taking differently: Apply 2 g topically 4 (four) times daily as needed (pain). )    . gabapentin (NEURONTIN) 100 MG capsule TAKE ONE CAPSULE BY MOUTH THREE TIMES DAILY.    Marland Kitchen glucose blood (PRODIGY NO CODING BLOOD GLUC) test strip USE TO CHECK BLOOD SUGAR TWICE DAILY. Dx E11.22    . Insulin Glargine (LANTUS SOLOSTAR) 100 UNIT/ML Solostar Pen Inject 18-50 Units into the skin at bedtime. (Patient taking differently: Inject 20 Units into the skin at bedtime. )    . Insulin Pen Needle (TRUEPLUS PEN NEEDLES) 31G X 5 MM MISC Use daily with insulin Dx E11.22    . Lactobacillus (ACIDOPHILUS PROBIOTIC) CAPS Take 1 capsule by mouth daily.    Marland Kitchen lidocaine-prilocaine (EMLA) cream Apply 1 application topically daily as needed (arm port).    Marland Kitchen linagliptin (TRADJENTA) 5 MG TABS  tablet Take 1 tablet (5 mg total) by mouth daily.    . pantoprazole (PROTONIX) 40 MG tablet  Take 40 mg by mouth daily. )    . PRODIGY TWIST TOP LANCETS 28G MISC USE TO CHECK BLOOD SUGAR UP TO FOUR TIMES DAILY.    Marland Kitchen sertraline (ZOLOFT) 100 MG tablet Take 1.5 tablets (150 mg total) by mouth daily.    . sevelamer carbonate (RENVELA) 800 MG tablet Take 2,400 mg by mouth See admin instructions. Take 2498m by mouth three times daily with means and 8083mwith snacks    . traZODone (DESYREL) 50 MG tablet Take 25 mg by mouth at bedtime as needed for sleep.           .      . multivitamin (RENA-VIT) TABS tablet Take 1 tablet by mouth daily.    .      .      .       Review of Systems PER HPI OTHERWISE ALL SYSTEMS ARE NEGATIVE.    Objective:   Physical Exam Constitutional:      General: She is not in acute distress.    Appearance: Normal appearance.  HENT:     Mouth/Throat:     Comments: MASK IN PLACE Eyes:     General: No scleral icterus.    Pupils: Pupils are equal, round, and reactive to light.  Cardiovascular:     Rate and Rhythm:  Normal rate and regular rhythm.     Pulses: Normal pulses.     Heart sounds: Normal heart sounds.  Pulmonary:     Effort: Pulmonary effort is normal.     Breath sounds: Normal breath sounds.  Abdominal:     General: Bowel sounds are normal.     Palpations: Abdomen is soft.     Tenderness: There is no abdominal tenderness.  Musculoskeletal:     Cervical back: Normal range of motion.     Right lower leg: No edema.     Left lower leg: No edema.  Lymphadenopathy:     Cervical: No cervical adenopathy.  Skin:    General: Skin is warm and dry.  Neurological:     Mental Status: She is alert and oriented to person, place, and time.     Comments: NO  NEW FOCAL DEFICITS  Psychiatric:        Mood and Affect: Mood normal.     Comments: NORMAL AFFECT       Assessment & Plan:

## 2019-09-02 NOTE — Assessment & Plan Note (Signed)
BMI > 30. WEIGHT UP SINCE 2019.  EAT TO LIVE AND THINK OF FOOD AS MEDICINE. 75% OF YOUR PLATE SHOULD BE FRUITS/VEGGIES. To have more energy, and to lose weight:     1. CONTINUE YOUR WEIGHT LOSS EFFORTS. I RECOMMEND YOU READ AND FOLLOW RECOMMENDATIONS BY DR. MARK HYMAN, "10-DAY DETOX DIET".   2. If you must eat bread, EAT EZEKIEL BREAD. IT IS IN THE FROZEN SECTION OF THE GROCERY STORE.   3. DRINK WATER WITH FRUIT OR CUCUMBER ADDED. YOUR URINE SHOULD BE LIGHT YELLOW. AVOID SODA, GATORADE, ENERGY DRINKS, OR DIET SODA.    4. AVOID HIGH FRUCTOSE CORN SYRUP AND CAFFEINE.    5. DO NOT chew SUGAR FREE GUM OR USE ARTIFICIAL SWEETENERS. IF NEEDED USE STEVIA AS A SWEETENER.   6. DO NOT EAT ENRICHED WHEAT FLOUR, PASTA, RICE, OR CEREAL.   7. ONLY EAT WILD CAUGHT SEAFOOD, GRASS FED BEEF OR CHICKEN, PORK FROM PASTURE RAISE PIGS, OR EGGS FROM PASTURE RAISED CHICKENS.   8. PRACTICE CHAIR YOGA or RIDE A STATIONERY BIKE FOR 15-30 MINS 3 OR 4 TIMES A WEEK AND PROGRESS TO HATHA YOGA OVER NEXT 6 MOS.   9. START TAKING VITAMIN B12, AND VITAMIN D3 2000 IU DAILY.  ADDITIONAL SUPPLEMENTS TO DECREASE CRAVING AND SUPPRESS YOUR APPETITE:    1. CINNAMON 500 MG EVERY AM PRIOR TO FIRST MEAL.   **STABILIZES BLOOD GLUCOSE/REDUCES CRAVINGS**   2. CHROMIUM 400-500 MG WITH MEALS TWICE DAILY.    **FAT BURNER**   3. GREEN TEA EXTRACT ONE DAILY.   **FAT BURNER/SUPPRESSES YOUR APPETITE**   4. ALPHA LIPOIC ACID TWICE DAILY.   **NATURAL ANTI-INFLAMMATORY SUPPLEMENT THAT IS AN ALTERNATIVE TO IBUPROFEN OR NAPROXEN**  FOLLOW UP IN 6 MOS.

## 2019-09-02 NOTE — Progress Notes (Signed)
Cc'ed to pcp °

## 2019-09-02 NOTE — Assessment & Plan Note (Signed)
SYMPTOMS CONTROLLED/RESOLVED.  CONTINUE TO MONITOR SYMPTOMS. FOLLOW UP IN 6 MOS.  

## 2019-09-04 DIAGNOSIS — D631 Anemia in chronic kidney disease: Secondary | ICD-10-CM | POA: Diagnosis not present

## 2019-09-04 DIAGNOSIS — N186 End stage renal disease: Secondary | ICD-10-CM | POA: Diagnosis not present

## 2019-09-04 DIAGNOSIS — N250 Renal osteodystrophy: Secondary | ICD-10-CM | POA: Diagnosis not present

## 2019-09-04 DIAGNOSIS — Z992 Dependence on renal dialysis: Secondary | ICD-10-CM | POA: Diagnosis not present

## 2019-09-05 DIAGNOSIS — N250 Renal osteodystrophy: Secondary | ICD-10-CM | POA: Diagnosis not present

## 2019-09-05 DIAGNOSIS — D631 Anemia in chronic kidney disease: Secondary | ICD-10-CM | POA: Diagnosis not present

## 2019-09-05 DIAGNOSIS — N186 End stage renal disease: Secondary | ICD-10-CM | POA: Diagnosis not present

## 2019-09-05 DIAGNOSIS — Z992 Dependence on renal dialysis: Secondary | ICD-10-CM | POA: Diagnosis not present

## 2019-09-08 DIAGNOSIS — D631 Anemia in chronic kidney disease: Secondary | ICD-10-CM | POA: Diagnosis not present

## 2019-09-08 DIAGNOSIS — N186 End stage renal disease: Secondary | ICD-10-CM | POA: Diagnosis not present

## 2019-09-08 DIAGNOSIS — Z992 Dependence on renal dialysis: Secondary | ICD-10-CM | POA: Diagnosis not present

## 2019-09-08 DIAGNOSIS — N250 Renal osteodystrophy: Secondary | ICD-10-CM | POA: Diagnosis not present

## 2019-09-09 ENCOUNTER — Encounter: Payer: Self-pay | Admitting: Cardiology

## 2019-09-09 ENCOUNTER — Telehealth (INDEPENDENT_AMBULATORY_CARE_PROVIDER_SITE_OTHER): Payer: Medicare Other | Admitting: Cardiology

## 2019-09-09 DIAGNOSIS — N186 End stage renal disease: Secondary | ICD-10-CM | POA: Diagnosis not present

## 2019-09-09 DIAGNOSIS — Z992 Dependence on renal dialysis: Secondary | ICD-10-CM | POA: Diagnosis not present

## 2019-09-09 DIAGNOSIS — Z8679 Personal history of other diseases of the circulatory system: Secondary | ICD-10-CM | POA: Diagnosis not present

## 2019-09-09 DIAGNOSIS — I5189 Other ill-defined heart diseases: Secondary | ICD-10-CM

## 2019-09-09 NOTE — Patient Instructions (Addendum)

## 2019-09-09 NOTE — Progress Notes (Signed)
Virtual Visit via Telephone Note   This visit type was conducted due to national recommendations for restrictions regarding the COVID-19 Pandemic (e.g. social distancing) in an effort to limit this patient's exposure and mitigate transmission in our community.  Due to her co-morbid illnesses, this patient is at least at moderate risk for complications without adequate follow up.  This format is felt to be most appropriate for this patient at this time.  The patient did not have access to video technology/had technical difficulties with video requiring transitioning to audio format only (telephone).  All issues noted in this document were discussed and addressed.  No physical exam could be performed with this format.  Please refer to the patient's chart for her  consent to telehealth for [facility].  Date:  09/09/2019   ID:  Kara Knapp, DOB Oct 15, 1970, MRN [id number]  Patient Location: Home Provider Location: Office  PCP:  Dettinger, Fransisca Kaufmann, MD  Cardiologist:  Rozann Lesches, MD Electrophysiologist:  None   Evaluation Performed:  Follow-Up Visit  Chief Complaint:  Cardiac follow-up  History of Present Illness:    Kara Knapp is a 49 y.o. female last seen in February 2020.  We spoke by phone today.  She tells me that she has been doing well overall, no worsening shortness of breath, no orthopnea or PND.  She continues to dialyze regularly on Tuesday, Thursday, and Saturday each week.  She is no longer on any antihypertensive medications.  She also reports stable weight.  I note that she is now on renally dosed Eliquis.  She states that this was started by her hemodialysis team due to frequently clotting access.  She does not have any cardiac arrhythmia.  Primary care continues with Dr. Warrick Parisian.  The patient does not have symptoms concerning for COVID-19 infection (fever, chills, cough, or new shortness of breath).    Past Medical History:  Diagnosis Date  . Anemia  of chronic disease   . Asthma   . Blind left eye   . Bronchitis   . Cataract   . Cholecystitis, acute 05/26/2013   Status post cholecystectomy  . Chronic abdominal pain   . Chronic diarrhea   . Depression   . Diabetic foot ulcer ([place]) 03/01/2015  . Diastolic heart failure ([place])   . ESRD on hemodialysis ([place])    Started diaylsis 12/29/15  . Essential hypertension   . Fibroids   . Glaucoma   . History of blood transfusion   . History of pneumonia   . Hyperlipidemia   . Insulin-dependent diabetes mellitus with retinopathy   . Liver fibrosis    Negative Hep B surface antigen, negative Hep C antibody Feb 2018 (see scanned in labs).  . Neuropathy   . Osteomyelitis ([place])    Toe on left foot   Past Surgical History:  Procedure Laterality Date  . A/V SHUNTOGRAM N/A 10/25/2016   Procedure: A/V Shuntogram - Right Arm;  Surgeon: Waynetta Sandy, MD;  Location: [place] [facility];  Service: Cardiovascular;  Laterality: N/A;  . A/V SHUNTOGRAM N/A 03/05/2018   Procedure: A/V SHUNTOGRAM - Right Arm;  Surgeon: Waynetta Sandy, MD;  Location: [place] [facility];  Service: Cardiovascular;  Laterality: N/A;  . AV FISTULA PLACEMENT Right 10/17/2015   Procedure: INSERTION OF ARTERIOVENOUS GORE-TEX GRAFT RIGHT UPPER ARM WITH ACUSEAL;  Surgeon: Conrad [place], MD;  Location: [place];  Service: Vascular;  Laterality: Right;  . CATARACT EXTRACTION W/ INTRAOCULAR LENS IMPLANT Bilateral   .  CESAREAN SECTION    . CHOLECYSTECTOMY N/A 05/25/2013   Procedure: LAPAROSCOPIC CHOLECYSTECTOMY;  Surgeon: Jamesetta So, MD;  [facility];  Service: General;  Laterality: N/A;  . COLONOSCOPY WITH PROPOFOL N/A 10/02/2016   two 3 to 5 mm polyps in the descending colon, three 2 to 3 mm polyps in the rectum, random colon biopsies, rectal bleeding due to internal hemorrhoids, friability with no bleeding at the anus status post biopsy.  Surgical pathology found the polyps to be a mix of hyperplastic and tubular  adenoma, random colon biopsies to be benign colonic mucosa, and the anal biopsies to be anal skin tag.   Marland Kitchen ESOPHAGOGASTRODUODENOSCOPY (EGD) WITH PROPOFOL N/A 10/02/2016   mucosal nodule in the esophagus status post biopsy, moderate gastritis status post biopsy, mild duodenitis. esophageal biopsy to be benign, gastric biopsies to be gastritis due to aspirin use, and duodenal biopsies to be duodenitis due to aspirin use  . EYE SURGERY Bilateral   . IR THROMBECTOMY AV FISTULA W/THROMBOLYSIS/PTA INC/SHUNT/IMG RIGHT Right 12/26/2018  . IR THROMBECTOMY AV FISTULA W/THROMBOLYSIS/PTA INC/SHUNT/IMG RIGHT Right 08/10/2019  . IR THROMBECTOMY AV FISTULA W/THROMBOLYSIS/PTA/STENT INC/SHUNT/IMG RT Right 08/08/2018  . IR THROMBECTOMY AV FISTULA W/THROMBOLYSIS/PTA/STENT INC/SHUNT/IMG RT Right 05/15/2019  . IR US GUIDE VASC ACCESS RIGHT  08/08/2018  . IR US GUIDE VASC ACCESS RIGHT  12/26/2018  . IR US GUIDE VASC ACCESS RIGHT  05/15/2019  . IR US GUIDE VASC ACCESS RIGHT  08/10/2019  . PARS PLANA VITRECTOMY Left 11/24/2014   Procedure: PARS PLANA VITRECTOMY WITH 25 GAUGE;  Surgeon: Hurman Horn, MD;  Location: [place];  Service: Ophthalmology;  Laterality: Left;  . PERIPHERAL VASCULAR BALLOON ANGIOPLASTY Right 03/05/2018   Procedure: PERIPHERAL VASCULAR BALLOON ANGIOPLASTY;  Surgeon: Waynetta Sandy, MD;  Location: [place] [facility];  Service: Cardiovascular;  Laterality: Right;  arm fistula  . PERIPHERAL VASCULAR CATHETERIZATION N/A 04/28/2015   Procedure: Bilateral Upper Extremity Venography;  Surgeon: Conrad [place], MD;  Location: [place] [facility];  Service: Cardiovascular;  Laterality: N/A;  . PHOTOCOAGULATION WITH LASER Left 11/24/2014   Procedure: PHOTOCOAGULATION WITH LASER;  Surgeon: Hurman Horn, MD;  Location: [place];  Service: Ophthalmology;  Laterality: Left;  with insertion of silicone oil  . SAVORY DILATION N/A 10/02/2016   Procedure: SAVORY DILATION;  Surgeon: Danie Binder, MD;  [facility];  Service: Endoscopy;  Laterality: N/A;  . TUBAL LIGATION       Current Meds  Medication Sig  . apixaban (ELIQUIS) 2.5 MG TABS tablet Take 2.5 mg by mouth 2 (two) times daily.  . ARIPiprazole (ABILIFY) 2 MG tablet Take 1 tablet (2 mg total) by mouth daily.  Marland Kitchen azelastine (ASTELIN) 0.1 % nasal spray Place 1 spray into both nostrils 2 (two) times daily. Use in each nostril as directed (Patient taking differently: 1 spray as needed. Use in each nostril as directed)  . cholecalciferol (VITAMIN D) 1000 units tablet Take 1,000 Units by mouth daily.   . cyclobenzaprine (FLEXERIL) 5 MG tablet TAKE ONE TABLET BY MOUTH THREE TIMES DAILY AS NEEDED FOR MUSCLE SPASM  . gabapentin (NEURONTIN) 100 MG capsule TAKE ONE CAPSULE BY MOUTH THREE TIMES DAILY.  Marland Kitchen Insulin Glargine (LANTUS SOLOSTAR) 100 UNIT/ML Solostar Pen Inject 18-50 Units into the skin at bedtime. (Patient taking differently: Inject 20 Units into the skin at bedtime. )  . linagliptin (TRADJENTA) 5 MG TABS tablet Take 1 tablet (5 mg total) by mouth daily.  . multivitamin (RENA-VIT)  TABS tablet Take 1 tablet by mouth daily.  . pantoprazole (PROTONIX) 40 MG tablet TAKE 1 BY MOUTH DAILY 30 minutes before breakfast (Patient taking differently: Take 40 mg by mouth daily. )  . Probiotic Product (CULTURELLE PROBIOTICS PO) Take 1 capsule by mouth daily.   . sertraline (ZOLOFT) 100 MG tablet Take 1.5 tablets (150 mg total) by mouth daily.  . sevelamer carbonate (RENVELA) 800 MG tablet Take 2,400 mg by mouth See admin instructions. Take 2458m by mouth three times daily with means and 8077mwith snacks  . traZODone (DESYREL) 50 MG tablet Take 0.5-1 tablets (25-50 mg total) by mouth at bedtime as needed. for sleep (Patient taking differently: Take 25 mg by mouth at bedtime as needed for sleep. )  . vitamin E (VITAMIN E) 400 UNIT capsule Take 1 capsule (400 Units total) by mouth daily.     Allergies:   Bactrim [sulfamethoxazole-trimethoprim] and  Prednisone   ROS:   No palpitations or syncope.   Prior CV studies:   The following studies were reviewed today:  Echocardiogram 09/06/2016: Study Conclusions  - Left ventricle: The cavity size was normal. Wall thickness was at the upper limits of normal. Systolic function was normal. The estimated ejection fraction was in the range of 55% to 60%. Wall motion was normal; there were no regional wall motion abnormalities. Features are consistent with a pseudonormal left ventricular filling pattern, with concomitant abnormal relaxation and increased filling pressure (grade 2 diastolic dysfunction). - Aortic valve: Mildly calcified annulus. Trileaflet; mildly calcified leaflets. There was trivial regurgitation. - Mitral valve: There was mild regurgitation. - Left atrium: The atrium was moderately dilated. - Right ventricle: The cavity size was mildly dilated. Systolic function was low normal. - Right atrium: Central venous pressure (est): 15 mm Hg. - Atrial septum: No defect or patent foramen ovale was identified. - Tricuspid valve: There was moderate regurgitation. - Pulmonic valve: There was mild regurgitation. - Pulmonary arteries: Systolic pressure was moderately increased. PA peak pressure: 54 mm Hg (S). - Pericardium, extracardiac: There was no pericardial effusion.  Impressions:  - Upper normal LV wall thickness with LVEF 55-60% and grade 2 diastolic dysfunction. Moderate left atrial enlargement. Mild mitral regurgitation. Sclerotic aortic valve with trivial aortic regurgitation. Mildly dilated right ventricle with low normal contraction. Moderate tricuspid regurgitation with evidence of moderate pulmonary hypertension, PASP estimated 54 mmHg.  Labs/Other Tests and Data Reviewed:    EKG:  An ECG dated 09/12/2018 was personally reviewed today and demonstrated:  Sinus rhythm with low voltage in the limb leads and nonspecific T wave  changes.  Recent Labs: 05/08/2019: ALT 13 08/10/2019: BUN 83; Creatinine, Ser 10.80; Hemoglobin 10.4; Platelets 172; Potassium 5.3; Sodium 137   Recent Lipid Panel Lab Results  Component Value Date/Time   CHOL 222 (H) 05/14/2018 01:57 PM   TRIG 227 (H) 05/14/2018 01:57 PM   HDL 65 05/14/2018 01:57 PM   CHOLHDL 3.4 05/14/2018 01:57 PM   LDLCALC 112 (H) 05/14/2018 01:57 PM    Wt Readings from Last 3 Encounters:  09/09/19 183 lb (83 kg)  09/02/19 187 lb (84.8 kg)  08/10/19 180 lb (81.6 kg)     Objective:    Vital Signs:  BP 106/68   Ht 5' 3"  (1.6 m)   Wt 183 lb (83 kg)   LMP 05/05/2015   BMI 32.42 kg/m    Patient spoke in full sentences, not short of breath. No audible wheezing or coughing. Speech pattern normal.  ASSESSMENT & PLAN:  1.  History of essential hypertension.  She is no longer on any antihypertensive medications, mainly to avoid hypotension with hemodialysis.  She was most recently taken off Coreg.  Would continue to follow with nephrologist.  2.  Moderate diastolic dysfunction based on previous work-up.  This has not been symptomatic, fluid status controlled with hemodialysis.  No further cardiac testing planned at this time.  3.  End-stage renal disease on hemodialysis.  She states that she is now on Eliquis due to frequent graft clotting.  Would follow with nephrology.   Time:   Today, I have spent 5 minutes with the patient with telehealth technology discussing the above problems.     Medication Adjustments/Labs and Tests Ordered: Current medicines are reviewed at length with the patient today.  Concerns regarding medicines are outlined above.   Tests Ordered: No orders of the defined types were placed in this encounter.   Medication Changes: No orders of the defined types were placed in this encounter.   Follow Up:  As needed.   Signed, Rozann Lesches, MD  09/09/2019 4:15 PM    [facility]

## 2019-09-10 DIAGNOSIS — N250 Renal osteodystrophy: Secondary | ICD-10-CM | POA: Diagnosis not present

## 2019-09-10 DIAGNOSIS — Z992 Dependence on renal dialysis: Secondary | ICD-10-CM | POA: Diagnosis not present

## 2019-09-10 DIAGNOSIS — N186 End stage renal disease: Secondary | ICD-10-CM | POA: Diagnosis not present

## 2019-09-10 DIAGNOSIS — D631 Anemia in chronic kidney disease: Secondary | ICD-10-CM | POA: Diagnosis not present

## 2019-09-11 ENCOUNTER — Ambulatory Visit (INDEPENDENT_AMBULATORY_CARE_PROVIDER_SITE_OTHER): Payer: Medicare Other | Admitting: Clinical

## 2019-09-11 ENCOUNTER — Other Ambulatory Visit: Payer: Self-pay

## 2019-09-11 DIAGNOSIS — F3341 Major depressive disorder, recurrent, in partial remission: Secondary | ICD-10-CM | POA: Diagnosis not present
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
## 2019-09-12 DIAGNOSIS — N250 Renal osteodystrophy: Secondary | ICD-10-CM | POA: Diagnosis not present

## 2019-09-12 DIAGNOSIS — N186 End stage renal disease: Secondary | ICD-10-CM | POA: Diagnosis not present

## 2019-09-12 DIAGNOSIS — D631 Anemia in chronic kidney disease: Secondary | ICD-10-CM | POA: Diagnosis not present

## 2019-09-12 DIAGNOSIS — Z992 Dependence on renal dialysis: Secondary | ICD-10-CM | POA: Diagnosis not present

## 2019-09-15 DIAGNOSIS — D631 Anemia in chronic kidney disease: Secondary | ICD-10-CM | POA: Diagnosis not present

## 2019-09-15 DIAGNOSIS — N250 Renal osteodystrophy: Secondary | ICD-10-CM | POA: Diagnosis not present

## 2019-09-15 DIAGNOSIS — N186 End stage renal disease: Secondary | ICD-10-CM | POA: Diagnosis not present

## 2019-09-15 DIAGNOSIS — Z992 Dependence on renal dialysis: Secondary | ICD-10-CM | POA: Diagnosis not present

## 2019-09-18 DIAGNOSIS — Z992 Dependence on renal dialysis: Secondary | ICD-10-CM | POA: Diagnosis not present

## 2019-09-18 DIAGNOSIS — D631 Anemia in chronic kidney disease: Secondary | ICD-10-CM | POA: Diagnosis not present

## 2019-09-18 DIAGNOSIS — N250 Renal osteodystrophy: Secondary | ICD-10-CM | POA: Diagnosis not present

## 2019-09-18 DIAGNOSIS — N186 End stage renal disease: Secondary | ICD-10-CM | POA: Diagnosis not present

## 2019-09-19 DIAGNOSIS — N186 End stage renal disease: Secondary | ICD-10-CM | POA: Diagnosis not present

## 2019-09-19 DIAGNOSIS — N250 Renal osteodystrophy: Secondary | ICD-10-CM | POA: Diagnosis not present

## 2019-09-19 DIAGNOSIS — Z992 Dependence on renal dialysis: Secondary | ICD-10-CM | POA: Diagnosis not present

## 2019-09-19 DIAGNOSIS — D631 Anemia in chronic kidney disease: Secondary | ICD-10-CM | POA: Diagnosis not present

## 2019-09-22 DIAGNOSIS — D631 Anemia in chronic kidney disease: Secondary | ICD-10-CM | POA: Diagnosis not present

## 2019-09-22 DIAGNOSIS — Z992 Dependence on renal dialysis: Secondary | ICD-10-CM | POA: Diagnosis not present

## 2019-09-22 DIAGNOSIS — N250 Renal osteodystrophy: Secondary | ICD-10-CM | POA: Diagnosis not present

## 2019-09-22 DIAGNOSIS — N186 End stage renal disease: Secondary | ICD-10-CM | POA: Diagnosis not present

## 2019-09-24 DIAGNOSIS — D631 Anemia in chronic kidney disease: Secondary | ICD-10-CM | POA: Diagnosis not present

## 2019-09-24 DIAGNOSIS — N186 End stage renal disease: Secondary | ICD-10-CM | POA: Diagnosis not present

## 2019-09-24 DIAGNOSIS — N250 Renal osteodystrophy: Secondary | ICD-10-CM | POA: Diagnosis not present

## 2019-09-24 DIAGNOSIS — Z992 Dependence on renal dialysis: Secondary | ICD-10-CM | POA: Diagnosis not present

## 2019-09-27 DIAGNOSIS — N186 End stage renal disease: Secondary | ICD-10-CM | POA: Diagnosis not present

## 2019-09-27 DIAGNOSIS — Z992 Dependence on renal dialysis: Secondary | ICD-10-CM | POA: Diagnosis not present

## 2019-09-28 ENCOUNTER — Other Ambulatory Visit: Payer: Self-pay

## 2019-09-28 ENCOUNTER — Ambulatory Visit (INDEPENDENT_AMBULATORY_CARE_PROVIDER_SITE_OTHER): Payer: Medicare Other | Admitting: Clinical

## 2019-09-28 DIAGNOSIS — F3341 Major depressive disorder, recurrent, in partial remission: Secondary | ICD-10-CM
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
## 2019-09-29 DIAGNOSIS — D631 Anemia in chronic kidney disease: Secondary | ICD-10-CM | POA: Diagnosis not present

## 2019-09-29 DIAGNOSIS — Z992 Dependence on renal dialysis: Secondary | ICD-10-CM | POA: Diagnosis not present

## 2019-09-29 DIAGNOSIS — N250 Renal osteodystrophy: Secondary | ICD-10-CM | POA: Diagnosis not present

## 2019-09-29 DIAGNOSIS — D509 Iron deficiency anemia, unspecified: Secondary | ICD-10-CM | POA: Diagnosis not present

## 2019-09-29 DIAGNOSIS — N186 End stage renal disease: Secondary | ICD-10-CM | POA: Diagnosis not present

## 2019-10-01 ENCOUNTER — Telehealth: Payer: Self-pay | Admitting: Gastroenterology

## 2019-10-01 ENCOUNTER — Other Ambulatory Visit: Payer: Self-pay | Admitting: Family Medicine

## 2019-10-01 DIAGNOSIS — R1319 Other dysphagia: Secondary | ICD-10-CM

## 2019-10-01 DIAGNOSIS — R1310 Dysphagia, unspecified: Secondary | ICD-10-CM

## 2019-10-01 NOTE — Telephone Encounter (Signed)
Lmom for pt to call us back.

## 2019-10-01 NOTE — Telephone Encounter (Signed)
Pt called back and said that she is having trouble swallowing food again.  Pt was seen on 09/02/2019.  Pt made aware that our doctors don't perform procedures at [facility].  Pt verbalized understanding and said [facility] would be fine with her.  Will she need another ov or proceed with scheduling EGD?  Pt has dialysis on Tues and Thurs.  [phone]
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
## 2019-10-01 NOTE — Telephone Encounter (Signed)
PLEASE CALL PT. SHE NEEDS A BARIUM SWALLOW TO ASSESS HER DIFFICULTY SWALLOWING. IF IT SHOWS OBSTRUCTION OF THE BARIUM TABLET THEN WE CAN PROCEED WITH AN EGD TO STRETCH HER ESOPHAGUS. IF THE TABLET PASSES SHE WILL NEED ADDITIONAL SWALLOWING STUDIES IN [place].

## 2019-10-01 NOTE — Telephone Encounter (Signed)
Pt called today asking for [place] to schedule an EGD at [facility]. She saw SF in the office on 09/02/2019. Please advise. [phone]

## 2019-10-01 NOTE — Telephone Encounter (Signed)
Lmom for pt to call me back. 

## 2019-10-02 NOTE — Addendum Note (Signed)
Addended by: Cheron Every on: 10/02/2019 11:08 AM   Modules accepted: Orders

## 2019-10-02 NOTE — Telephone Encounter (Signed)
Called pt and made her aware of SLF's recommendations.  She was informed that she needs a barium swallow to assess her difficulty swallowing.  Discussed future steps as well with pt.  She was made aware that if it shows obstruction then we can proceed with an EGD.  If the tablet passes she will need additional swallowing studies in [place].  Pt voiced understanding.  Routing to clinical pool.

## 2019-10-02 NOTE — Telephone Encounter (Signed)
Scheduled for 3/9 at 9:30am, arrival 9:!5am, npo 3 hours prior at [facility].  Called pt and she is aware of appt details. She states she can't do this day bc she has dialysis on tues, thurs.   Called CS back and has been r/s'd to 3/10 at 11:00am, arrival 10:45am, npo 3 hrs prior.  Patient is aware of appt details. Nothing further needed

## 2019-10-03 DIAGNOSIS — N186 End stage renal disease: Secondary | ICD-10-CM | POA: Diagnosis not present

## 2019-10-03 DIAGNOSIS — D509 Iron deficiency anemia, unspecified: Secondary | ICD-10-CM | POA: Diagnosis not present

## 2019-10-03 DIAGNOSIS — Z992 Dependence on renal dialysis: Secondary | ICD-10-CM | POA: Diagnosis not present

## 2019-10-03 DIAGNOSIS — N250 Renal osteodystrophy: Secondary | ICD-10-CM | POA: Diagnosis not present

## 2019-10-03 DIAGNOSIS — D631 Anemia in chronic kidney disease: Secondary | ICD-10-CM | POA: Diagnosis not present

## 2019-10-05 DIAGNOSIS — Z23 Encounter for immunization: Secondary | ICD-10-CM | POA: Diagnosis not present

## 2019-10-06 ENCOUNTER — Other Ambulatory Visit: Payer: Self-pay | Admitting: Family Medicine

## 2019-10-06 ENCOUNTER — Ambulatory Visit (HOSPITAL_COMMUNITY): Payer: Medicare Other

## 2019-10-06 DIAGNOSIS — N250 Renal osteodystrophy: Secondary | ICD-10-CM | POA: Diagnosis not present

## 2019-10-06 DIAGNOSIS — U071 COVID-19: Secondary | ICD-10-CM | POA: Diagnosis not present

## 2019-10-06 DIAGNOSIS — D631 Anemia in chronic kidney disease: Secondary | ICD-10-CM | POA: Diagnosis not present

## 2019-10-06 DIAGNOSIS — N186 End stage renal disease: Secondary | ICD-10-CM | POA: Diagnosis not present

## 2019-10-06 DIAGNOSIS — D509 Iron deficiency anemia, unspecified: Secondary | ICD-10-CM | POA: Diagnosis not present

## 2019-10-06 DIAGNOSIS — Z992 Dependence on renal dialysis: Secondary | ICD-10-CM | POA: Diagnosis not present

## 2019-10-07 ENCOUNTER — Ambulatory Visit (HOSPITAL_COMMUNITY): Payer: Medicare Other

## 2019-10-07 DIAGNOSIS — H4720 Unspecified optic atrophy: Secondary | ICD-10-CM | POA: Diagnosis not present

## 2019-10-07 DIAGNOSIS — H211X1 Other vascular disorders of iris and ciliary body, right eye: Secondary | ICD-10-CM | POA: Diagnosis not present

## 2019-10-07 DIAGNOSIS — E113592 Type 2 diabetes mellitus with proliferative diabetic retinopathy without macular edema, left eye: Secondary | ICD-10-CM | POA: Diagnosis not present

## 2019-10-07 DIAGNOSIS — E113511 Type 2 diabetes mellitus with proliferative diabetic retinopathy with macular edema, right eye: Secondary | ICD-10-CM | POA: Diagnosis not present

## 2019-10-08 ENCOUNTER — Other Ambulatory Visit: Payer: Self-pay | Admitting: Radiology

## 2019-10-08 ENCOUNTER — Other Ambulatory Visit (HOSPITAL_COMMUNITY): Payer: Self-pay | Admitting: Nephrology

## 2019-10-08 ENCOUNTER — Other Ambulatory Visit: Payer: Self-pay | Admitting: Physician Assistant

## 2019-10-08 DIAGNOSIS — Z992 Dependence on renal dialysis: Secondary | ICD-10-CM

## 2019-10-08 DIAGNOSIS — N186 End stage renal disease: Secondary | ICD-10-CM

## 2019-10-09 ENCOUNTER — Ambulatory Visit (HOSPITAL_COMMUNITY)
Admission: RE | Admit: 2019-10-09 | Discharge: 2019-10-09 | Disposition: A | Discharge status: Home / Self Care | Payer: Medicare Other | Source: Ambulatory Visit | Attending: Nephrology | Admitting: Nephrology

## 2019-10-09 ENCOUNTER — Other Ambulatory Visit (HOSPITAL_COMMUNITY): Payer: Self-pay | Admitting: Nephrology

## 2019-10-09 ENCOUNTER — Other Ambulatory Visit: Payer: Self-pay

## 2019-10-09 DIAGNOSIS — I5032 Chronic diastolic (congestive) heart failure: Secondary | ICD-10-CM | POA: Diagnosis not present

## 2019-10-09 DIAGNOSIS — N186 End stage renal disease: Secondary | ICD-10-CM

## 2019-10-09 DIAGNOSIS — E1122 Type 2 diabetes mellitus with diabetic chronic kidney disease: Secondary | ICD-10-CM | POA: Insufficient documentation

## 2019-10-09 DIAGNOSIS — E1140 Type 2 diabetes mellitus with diabetic neuropathy, unspecified: Secondary | ICD-10-CM | POA: Diagnosis not present

## 2019-10-09 DIAGNOSIS — Z794 Long term (current) use of insulin: Secondary | ICD-10-CM | POA: Diagnosis not present

## 2019-10-09 DIAGNOSIS — D631 Anemia in chronic kidney disease: Secondary | ICD-10-CM | POA: Diagnosis not present

## 2019-10-09 DIAGNOSIS — Y841 Kidney dialysis as the cause of abnormal reaction of the patient, or of later complication, without mention of misadventure at the time of the procedure: Secondary | ICD-10-CM | POA: Diagnosis not present

## 2019-10-09 DIAGNOSIS — Z4901 Encounter for fitting and adjustment of extracorporeal dialysis catheter: Secondary | ICD-10-CM | POA: Diagnosis not present

## 2019-10-09 DIAGNOSIS — I132 Hypertensive heart and chronic kidney disease with heart failure and with stage 5 chronic kidney disease, or end stage renal disease: Secondary | ICD-10-CM | POA: Insufficient documentation

## 2019-10-09 DIAGNOSIS — Z7901 Long term (current) use of anticoagulants: Secondary | ICD-10-CM | POA: Insufficient documentation

## 2019-10-09 DIAGNOSIS — T82510A Breakdown (mechanical) of surgically created arteriovenous fistula, initial encounter: Secondary | ICD-10-CM | POA: Diagnosis not present

## 2019-10-09 DIAGNOSIS — Z992 Dependence on renal dialysis: Secondary | ICD-10-CM | POA: Diagnosis not present

## 2019-10-09 DIAGNOSIS — E785 Hyperlipidemia, unspecified: Secondary | ICD-10-CM | POA: Insufficient documentation

## 2019-10-09 DIAGNOSIS — H5440 Blindness, one eye, unspecified eye: Secondary | ICD-10-CM | POA: Diagnosis not present

## 2019-10-09 DIAGNOSIS — F329 Major depressive disorder, single episode, unspecified: Secondary | ICD-10-CM | POA: Diagnosis not present

## 2019-10-09 DIAGNOSIS — Z79899 Other long term (current) drug therapy: Secondary | ICD-10-CM | POA: Diagnosis not present

## 2019-10-09 HISTORY — PX: IR FLUORO GUIDE CV LINE RIGHT: IMG2283

## 2019-10-09 HISTORY — PX: IR US GUIDE VASC ACCESS RIGHT: IMG2390

## 2019-10-09 LAB — BASIC METABOLIC PANEL
Anion gap: 14 (ref 5–15)
BUN: 97 mg/dL — ABNORMAL HIGH (ref 6–20)
CO2: 19 mmol/L — ABNORMAL LOW (ref 22–32)
Calcium: 9.1 mg/dL (ref 8.9–10.3)
Chloride: 103 mmol/L (ref 98–111)
Creatinine, Ser: 12.16 mg/dL — ABNORMAL HIGH (ref 0.44–1.00)
GFR calc Af Amer: 4 mL/min — ABNORMAL LOW (ref >60)
GFR calc non Af Amer: 3 mL/min — ABNORMAL LOW (ref >60)
Glucose, Bld: 95 mg/dL (ref 70–99)
Potassium: 6.7 mmol/L (ref 3.5–5.1)
Sodium: 136 mmol/L (ref 135–145)

## 2019-10-09 LAB — GLUCOSE, CAPILLARY: Glucose-Capillary: 123 mg/dL — ABNORMAL HIGH (ref 70–99)

## 2019-10-09 MED ORDER — LIDOCAINE HCL 1 % IJ SOLN
INTRAMUSCULAR | Status: AC
  Administered 2019-10-09: 10 mL
  Filled 2019-10-09: qty 20

## 2019-10-09 MED ORDER — GELATIN ABSORBABLE 12-7 MM EX MISC
CUTANEOUS | Status: AC
  Filled 2019-10-09: qty 1

## 2019-10-09 MED ORDER — FENTANYL CITRATE (PF) 100 MCG/2ML IJ SOLN
INTRAMUSCULAR | Status: AC
  Filled 2019-10-09: qty 2

## 2019-10-09 MED ORDER — FENTANYL CITRATE (PF) 100 MCG/2ML IJ SOLN
INTRAMUSCULAR | Status: AC | PRN
  Administered 2019-10-09: 50 ug via INTRAVENOUS

## 2019-10-09 MED ORDER — CEFAZOLIN SODIUM-DEXTROSE 2-4 GM/100ML-% IV SOLN
INTRAVENOUS | Status: AC | PRN
  Administered 2019-10-09: 2 g via INTRAVENOUS

## 2019-10-09 MED ORDER — HEPARIN SODIUM (PORCINE) 1000 UNIT/ML IJ SOLN
INTRAMUSCULAR | Status: AC
  Filled 2019-10-09: qty 1

## 2019-10-09 MED ORDER — MIDAZOLAM HCL 2 MG/2ML IJ SOLN
INTRAMUSCULAR | Status: AC
  Filled 2019-10-09: qty 2

## 2019-10-09 MED ORDER — SODIUM CHLORIDE 0.9 % IV SOLN: INTRAVENOUS | Status: DC

## 2019-10-09 MED ORDER — CEFAZOLIN SODIUM-DEXTROSE 2-4 GM/100ML-% IV SOLN
INTRAVENOUS | Status: AC
  Filled 2019-10-09: qty 100

## 2019-10-09 MED ORDER — MIDAZOLAM HCL 2 MG/2ML IJ SOLN
INTRAMUSCULAR | Status: AC | PRN
  Administered 2019-10-09: 1 mg via INTRAVENOUS

## 2019-10-09 NOTE — Discharge Instructions (Signed)
Vascular Access for Hemodialysis        A vascular access is a connection to the blood inside your blood vessels that allows blood to be easily removed from your body and returned to your body during kidney dialysis (hemodialysis). Hemodialysis is a procedure in which a machine outside of the body filters the blood of a person whose kidneys are no longer working properly. There are three types of vascular accesses:  Arteriovenous fistula (AVF). This is a connection between an artery and a vein (usually in the arm) that is made by sewing them together. Blood in the artery flows directly into the vein, causing it to get larger over time. This makes it easier for the vein to be used for hemodialysis. An arteriovenous fistula takes 1-6 months to develop after surgery.  Arteriovenous graft (AVG). This is a connection between an artery and a vein in the arm that is made with a tube. An arteriovenous graft can be used within 2-3 weeks of surgery.  Venous catheter. This is a thin, flexible tube that is placed in a large vein (usually in the neck, chest, or groin). A venous catheter for hemodialysis contains two tubes that come out of the skin. A venous catheter can be used right away. It is usually used as a temporary access if you need hemodialysis before a fistula or graft has developed, or if kidney failure is sudden (acute) and likely to improve without the need for long-term dialysis. It may also be used as a permanent access if a fistula or graft cannot be created. Which type of access is best for me? The type of access that is best for you depends on the size and strength of your veins, your age, and any other health problems that you have, such as diabetes. An ultrasound test may be used to look at your veins to help make this decision. A fistula is usually the preferred type of access. It can last several years and is less likely than the other types of accesses to become infected or to cause a blood  clot within a blood vessel (thrombosis). However, a fistula is not an option for everyone. If your veins are not the right size or if the fistula does not develop properly, a graft may be used instead. Grafts require you to have strong veins. If your veins are not strong enough for a graft, a catheter may be used. Catheters are more likely than fistulas and grafts to become infected or to have a thrombosis. Sometimes, only one type of access is an option. Your health care provider will help you determine which type of access is best for you. How is a vascular access used? The way that the access is used depends on the type of access:  If the access is a fistula or graft, two needles are inserted through the skin into the access before each hemodialysis session. Blood leaves the body through one of the needles and travels through a tube to the hemodialysis machine (dialyzer). Then it flows through another tube and returns to the body through the second needle.  If the access is a catheter, one tube is connected directly to the tube that leads to the dialyzer, and the other tube is connected to a tube that leads away from the dialyzer. Blood leaves the body through one tube and returns to the body through the other. What problems can occur with vascular access?  A blood clot within a blood vessel (thrombosis).  Thrombosis can lead to a narrowing of a blood vessel (stenosis). If thrombosis occurs frequently, another access site may be created as a backup.  Infection.  Heart enlargement (cardiomegaly) and heart failure. Changes in blood flow may cause an increase in blood pressure or heart rate, making your heart work harder to pump blood. These problems are most likely to occur with a venous catheter and least likely to occur with an arteriovenous fistula. How do I care for my vascular access?  Wear a medical alert bracelet. In case of an emergency, this bracelet tells health care providers that you  are a dialysis patient and allows them to care for your veins appropriately. If you have a graft or fistula:  A "bruit" is a noise that is heard with a stethoscope, and a "thrill" is a vibration that is felt over the graft or fistula. The presence of the bruit and thrill indicates that the access is working. You will be taught to feel for the thrill each day. If this is not felt, the access may be clotted. Call your health care provider.  Keep your arm straight and raised (elevated) above your heart while the access site is healing.  You may freely use the arm where your vascular access is located after the site heals. Keep the following in mind: ? Avoid pressure on the arm. ? Avoid lifting heavy objects with the arm. ? Avoid sleeping on the arm. ? Avoid wearing tight-sleeved shirts or jewelry around the graft or fistula.  Do not allow blood pressure monitoring or needle punctures on the side where the graft or fistula is located.  With permission from your health care provider, you may do exercises to help with blood flow through a fistula. These exercises involve squeezing a rubber ball or other soft objects as instructed.  Wash your access site according to directions from your health care provider. If you have a venous catheter:  Keep the insertion site clean and dry at all times.  If you are told you can shower after the site heals, use a protective covering over the catheter to keep it dry.  Follow directions from your health care provider for bandage (dressing) changes.  Ask your health care provider what activities are safe for you. You may be restricted from lifting or making repetitive arm movements on the side with the catheter. Contact a health care provider if:  Swelling around the graft or fistula gets worse.  You develop new pain.  Your catheter gets damaged. Get help right away if:  You have pain, numbness, an unusual pale skin color, or blue fingers or sores at  the tips of your fingers in the hand on the side of your fistula.  You have chills.  You have a fever.  You have pus or other fluid (drainage) at the vascular access site.  You develop skin redness or red streaking on the skin around, above, or below the vascular access.  You have bleeding at the vascular access that cannot be easily controlled.  Your catheter gets pulled out of place.  You feel your heart racing or skipping beats.  You have chest pain. Summary  A vascular access is a connection to the blood inside your blood vessels that allows blood to be easily removed from your body and returned to your body during kidney dialysis (hemodialysis).  There are three types of vascular accesses.The type of access that is best for you depends on the size and strength of your  veins, your age, and any other health problems that you have, such as diabetes.  A fistula is usually the preferred type of access, although it is not an option for everyone. It can last several years and is less likely than the other types of accesses to become infected or to cause a blood clot within a blood vessel (thrombosis).  Wear a medical alert bracelet. In case of an emergency, this tells health care providers that you are a dialysis patient. This information is not intended to replace advice given to you by your health care provider. Make sure you discuss any questions you have with your health care provider. Document Revised: 11/04/2018 Document Reviewed: 08/10/2016 Elsevier Patient Education  [place]. Moderate Conscious Sedation, Adult, Care After These instructions provide you with information about caring for yourself after your procedure. Your health care provider may also give you more specific instructions. Your treatment has been planned according to current medical practices, but problems sometimes occur. Call your health care provider if you have any problems or questions after your  procedure. What can I expect after the procedure? After your procedure, it is common:  To feel sleepy for several hours.  To feel clumsy and have poor balance for several hours.  To have poor judgment for several hours.  To vomit if you eat too soon. Follow these instructions at home: For at least 24 hours after the procedure:   Do not: ? Participate in activities where you could fall or become injured. ? Drive. ? Use heavy machinery. ? Drink alcohol. ? Take sleeping pills or medicines that cause drowsiness. ? Make important decisions or sign legal documents. ? Take care of children on your own.  Rest. Eating and drinking  Follow the diet recommended by your health care provider.  If you vomit: ? Drink water, juice, or soup when you can drink without vomiting. ? Make sure you have little or no nausea before eating solid foods. General instructions  Have a responsible adult stay with you until you are awake and alert.  Take over-the-counter and prescription medicines only as told by your health care provider.  If you smoke, do not smoke without supervision.  Keep all follow-up visits as told by your health care provider. This is important. Contact a health care provider if:  You keep feeling nauseous or you keep vomiting.  You feel light-headed.  You develop a rash.  You have a fever. Get help right away if:  You have trouble breathing. This information is not intended to replace advice given to you by your health care provider. Make sure you discuss any questions you have with your health care provider. Document Revised: 06/28/2017 Document Reviewed: 11/05/2015 Elsevier Patient Education  2020 [employer].

## 2019-10-09 NOTE — Sedation Documentation (Signed)
Dr. Annamaria Boots notified of elevated potassium of 6.7

## 2019-10-09 NOTE — Procedures (Signed)
Interventional Radiology Procedure Note  Procedure: RT IJ HD TUNNELED CATH  Complications: None  Estimated Blood Loss: MIN  Findings: TIP SVCRA

## 2019-10-09 NOTE — Progress Notes (Signed)
Discharge instructions reviewed with pt and family voices understanding.

## 2019-10-09 NOTE — H&P (Signed)
Chief Complaint: Patient was seen in consultation today for clotted dialysis access  Referring Physician(s): Lateef,Munsoor  Supervising Physician: Daryll Brod  Patient Status: [facility] - Out-pt  History of Present Illness: Kara Knapp is a 49 y.o. female with a past medical history significant for depression, left eye blindness, glaucoma, HTN, diastolic heart failure, HLD, DM, ESRD on HD via RUE AV graft who presents today due to clotted HD access. Patient is well known to IR due to several previous successful interventions on her AV graft (08/08/18, 12/26/18, 05/15/19, 08/10/19).   Kara Knapp states that she last had HD on Tuesday (3/9) and completed the entire session, when she returned on Thursday her graft was clotted and they could not access it. She states she is frustrated that this keeps happening and was hoping that starting Eliquis would prevent any further issues, she does have an appointment with vascular surgery this month which she thinks might be on the 24th. She denies any other complaints and is agreeable to proceed.   Past Medical History:  Diagnosis Date  . Anemia of chronic disease   . Asthma   . Blind left eye   . Bronchitis   . Cataract   . Cholecystitis, acute 05/26/2013   Status post cholecystectomy  . Chronic abdominal pain   . Chronic diarrhea   . Depression   . Diabetic foot ulcer ([place]) 03/01/2015  . Diastolic heart failure ([place])   . ESRD on hemodialysis ([place])    Started diaylsis 12/29/15  . Essential hypertension   . Fibroids   . Glaucoma   . History of blood transfusion   . History of pneumonia   . Hyperlipidemia   . Insulin-dependent diabetes mellitus with retinopathy   . Liver fibrosis    Negative Hep B surface antigen, negative Hep C antibody Feb 2018 (see scanned in labs).  . Neuropathy   . Osteomyelitis ([place])    Toe on left foot    Past Surgical History:  Procedure Laterality Date  . A/V SHUNTOGRAM N/A 10/25/2016   Procedure: A/V  Shuntogram - Right Arm;  Surgeon: Waynetta Sandy, MD;  Location: [place] [facility];  Service: Cardiovascular;  Laterality: N/A;  . A/V SHUNTOGRAM N/A 03/05/2018   Procedure: A/V SHUNTOGRAM - Right Arm;  Surgeon: Waynetta Sandy, MD;  Location: [place] [facility];  Service: Cardiovascular;  Laterality: N/A;  . AV FISTULA PLACEMENT Right 10/17/2015   Procedure: INSERTION OF ARTERIOVENOUS GORE-TEX GRAFT RIGHT UPPER ARM WITH ACUSEAL;  Surgeon: Conrad Eek, MD;  Location: [place];  Service: Vascular;  Laterality: Right;  . CATARACT EXTRACTION W/ INTRAOCULAR LENS IMPLANT Bilateral   . CESAREAN SECTION    . CHOLECYSTECTOMY N/A 05/25/2013   Procedure: LAPAROSCOPIC CHOLECYSTECTOMY;  Surgeon: Jamesetta So, MD;  Location: [facility];  Service: General;  Laterality: N/A;  . COLONOSCOPY WITH PROPOFOL N/A 10/02/2016   two 3 to 5 mm polyps in the descending colon, three 2 to 3 mm polyps in the rectum, random colon biopsies, rectal bleeding due to internal hemorrhoids, friability with no bleeding at the anus status post biopsy.  Surgical pathology found the polyps to be a mix of hyperplastic and tubular adenoma, random colon biopsies to be benign colonic mucosa, and the anal biopsies to be anal skin tag.   Marland Kitchen ESOPHAGOGASTRODUODENOSCOPY (EGD) WITH PROPOFOL N/A 10/02/2016   mucosal nodule in the esophagus status post biopsy, moderate gastritis status post biopsy, mild duodenitis. esophageal biopsy to be benign, gastric biopsies to  be gastritis due to aspirin use, and duodenal biopsies to be duodenitis due to aspirin use  . EYE SURGERY Bilateral   . IR THROMBECTOMY AV FISTULA W/THROMBOLYSIS/PTA INC/SHUNT/IMG RIGHT Right 12/26/2018  . IR THROMBECTOMY AV FISTULA W/THROMBOLYSIS/PTA INC/SHUNT/IMG RIGHT Right 08/10/2019  . IR THROMBECTOMY AV FISTULA W/THROMBOLYSIS/PTA/STENT INC/SHUNT/IMG RT Right 08/08/2018  . IR THROMBECTOMY AV FISTULA W/THROMBOLYSIS/PTA/STENT INC/SHUNT/IMG RT Right 05/15/2019  . IR US GUIDE VASC  ACCESS RIGHT  08/08/2018  . IR US GUIDE VASC ACCESS RIGHT  12/26/2018  . IR US GUIDE VASC ACCESS RIGHT  05/15/2019  . IR US GUIDE VASC ACCESS RIGHT  08/10/2019  . PARS PLANA VITRECTOMY Left 11/24/2014   Procedure: PARS PLANA VITRECTOMY WITH 25 GAUGE;  Surgeon: Hurman Horn, MD;  Location: [place];  Service: Ophthalmology;  Laterality: Left;  . PERIPHERAL VASCULAR BALLOON ANGIOPLASTY Right 03/05/2018   Procedure: PERIPHERAL VASCULAR BALLOON ANGIOPLASTY;  Surgeon: Waynetta Sandy, MD;  Location: [place] [facility];  Service: Cardiovascular;  Laterality: Right;  arm fistula  . PERIPHERAL VASCULAR CATHETERIZATION N/A 04/28/2015   Procedure: Bilateral Upper Extremity Venography;  Surgeon: Conrad Cherokee, MD;  Location: [facility];  Service: Cardiovascular;  Laterality: N/A;  . PHOTOCOAGULATION WITH LASER Left 11/24/2014   Procedure: PHOTOCOAGULATION WITH LASER;  Surgeon: Hurman Horn, MD;  Location: [place];  Service: Ophthalmology;  Laterality: Left;  with insertion of silicone oil  . SAVORY DILATION N/A 10/02/2016   Procedure: SAVORY DILATION;  Surgeon: Danie Binder, MD;  [facility];  Service: Endoscopy;  Laterality: N/A;  . TUBAL LIGATION      Allergies: Bactrim [sulfamethoxazole-trimethoprim] and Prednisone  Medications: Prior to Admission medications   Medication Sig Start Date End Date Taking? Authorizing Provider  apixaban (ELIQUIS) 2.5 MG TABS tablet Take 2.5 mg by mouth 2 (two) times daily.    [provider]  ARIPiprazole (ABILIFY) 2 MG tablet Take 1 tablet (2 mg total) by mouth daily. 08/21/19   Nevada Crane, MD  azelastine (ASTELIN) 0.1 % nasal spray Place 1 spray into both nostrils 2 (two) times daily. Use in each nostril as directed Patient taking differently: 1 spray as needed. Use in each nostril as directed 08/29/18   Dettinger, Fransisca Kaufmann, MD  brimonidine-timolol (COMBIGAN) 0.2-0.5 % ophthalmic solution Place 1 drop into both eyes daily.     [provider]  cholecalciferol (VITAMIN D) 1000 units tablet Take 1,000 Units by mouth daily.     [provider]  clindamycin (CLEOCIN) 300 MG capsule  06/08/19   [provider]  Continuous Blood Gluc Sensor (FREESTYLE LIBRE 14 DAY SENSOR) MISC Inject 1 each into the skin every 14 (fourteen) days. Use as directed. 11/27/17   Cassandria Anger, MD  cyclobenzaprine (FLEXERIL) 5 MG tablet TAKE ONE TABLET BY MOUTH THREE TIMES DAILY AS NEEDED FOR MUSCLE SPASM 08/31/19   Dettinger, Fransisca Kaufmann, MD  diclofenac sodium (VOLTAREN) 1 % GEL Apply 2 g topically 4 (four) times daily. Patient taking differently: Apply 2 g topically 4 (four) times daily as needed (pain).  11/14/18   Janora Norlander, DO  gabapentin (NEURONTIN) 100 MG capsule TAKE ONE CAPSULE BY MOUTH THREE TIMES DAILY. 10/07/19   Dettinger, Fransisca Kaufmann, MD  glucose blood (PRODIGY NO CODING BLOOD GLUC) test strip USE TO CHECK BLOOD SUGAR TWICE DAILY. Dx E11.22 02/03/19   Dettinger, Fransisca Kaufmann, MD  Insulin Pen Needle (TRUEPLUS PEN NEEDLES) 31G X 5 MM MISC Use daily with insulin Dx  E11.22 03/09/19   Dettinger, Fransisca Kaufmann, MD  LANTUS SOLOSTAR 100 UNIT/ML Solostar Pen INJECT 18-50 UNITS SUBCUTANEOUSLY AT BEDTIME. 10/01/19   Dettinger, Fransisca Kaufmann, MD  lidocaine-prilocaine (EMLA) cream Apply 1 application topically daily as needed (arm port).    [provider]  linagliptin (TRADJENTA) 5 MG TABS tablet Take 1 tablet (5 mg total) by mouth daily. 03/18/19   Dettinger, Fransisca Kaufmann, MD  multivitamin (RENA-VIT) TABS tablet Take 1 tablet by mouth daily. 04/03/19   [provider]  pantoprazole (PROTONIX) 40 MG tablet TAKE 1 BY MOUTH DAILY 30 minutes before breakfast Patient taking differently: Take 40 mg by mouth daily.  02/02/19   Dettinger, Fransisca Kaufmann, MD  Probiotic Product (CULTURELLE PROBIOTICS PO) Take 1 capsule by mouth daily.     [provider]  PRODIGY TWIST TOP LANCETS 28G MISC USE TO CHECK BLOOD SUGAR UP TO FOUR TIMES DAILY.  03/13/17   Timmothy Euler, MD  sertraline (ZOLOFT) 100 MG tablet Take 1.5 tablets (150 mg total) by mouth daily. 08/21/19   [place]ev[place] Crane, MD  sevelamer carbonate (RENVELA) 800 MG tablet Take 2,400 mg by mouth See admin instructions. Take 2436m by mouth three times daily with means and 8051mwith snacks    [provider]  traZODone (DESYREL) 50 MG tablet Take 0.5-1 tablets (25-50 mg total) by mouth at bedtime as needed. for sleep Patient taking differently: Take 25 mg by mouth at bedtime as needed for sleep.  03/18/19   Dettinger, JoFransisca KaufmannMD  vitamin E (VITAMIN E) 400 UNIT capsule Take 1 capsule (400 Units total) by mouth daily. 06/18/19   HaSharion BalloonFNP     Family History  Problem Relation Age of Onset  . COPD Mother   . Cancer Father   . Lymphoma Father   . Diabetes Sister   . Deep vein thrombosis Sister   . Diabetes Brother   . Hyperlipidemia Brother   . Hypertension Brother   . Mental retardation Sister   . Alcohol abuse Paternal Grandmother   . Colon cancer Neg Hx   . Liver disease Neg Hx     Social History   Socioeconomic History  . Marital status: Single    Spouse name: Not on file  . Number of children: 5  . Years of education: GED  . Highest education level: Not on file  Occupational History  . Not on file  Tobacco Use  . Smoking status: Current Every Day Smoker    Packs/day: 1.50    Years: 23.00    Pack years: 34.50    Types: Cigarettes    Start date: 12/03/2000  . Smokeless tobacco: Never Used  . Tobacco comment: one pack daily  Substance and Sexual Activity  . Alcohol use: No    Alcohol/week: 0.0 standard drinks  . Drug use: No    Comment: Sober for 8 years  . Sexual activity: Not Currently    Birth control/protection: Surgical  Other Topics Concern  . Not on file  Social History Narrative  . Not on file   Social Determinants of Health   Financial Resource Strain:   . Difficulty of Paying Living Expenses:   Food Insecurity:     . Worried About RuCharity fundraisern the Last Year:   . RaArboriculturistn the Last Year:   Transportation Needs:   . LaFilm/video editorMedical):   . Marland Kitchenack of Transportation (Non-Medical):   Physical Activity:   .  Days of Exercise per Week:   . Minutes of Exercise per Session:   Stress:   . Feeling of Stress :   Social Connections:   . Frequency of Communication with Friends and Family:   . Frequency of Social Gatherings with Friends and Family:   . Attends Religious Services:   . Active Member of Clubs or Organizations:   . Attends Archivist Meetings:   Marland Kitchen Marital Status:      Review of Systems: A 12 point ROS discussed and pertinent positives are indicated in the HPI above.  All other systems are negative.  Review of Systems  Constitutional: Negative for chills and fever.  Respiratory: Negative for cough and shortness of breath.   Cardiovascular: Negative for chest pain.  Gastrointestinal: Negative for abdominal pain, blood in stool, diarrhea, nausea and vomiting.  Musculoskeletal: Negative for back pain.  Skin: Negative for rash and wound.  Neurological: Negative for dizziness and headaches.    Vital Signs: BP (!) 199/81   Pulse 68   Temp 98 F (36.7 C) (Oral)   Resp 16   Ht 5' 3"  (1.6 m)   Wt 180 lb (81.6 kg)   LMP 05/05/2015   SpO2 99%   BMI 31.89 kg/m   Physical Exam Vitals and nursing note reviewed.  Constitutional:      General: She is not in acute distress. HENT:     Head: Normocephalic.     Mouth/Throat:     Mouth: Mucous membranes are moist.     Pharynx: Oropharynx is clear. No oropharyngeal exudate or posterior oropharyngeal erythema.     Comments: Edentulous Cardiovascular:     Rate and Rhythm: Normal rate and regular rhythm.     Comments: (+) RUE AV graft - no palpable thrill or audible bruit. No erythema, edema, tenderness, drainage or bleeding. Pulmonary:     Effort: Pulmonary effort is normal.     Breath sounds: Normal  breath sounds.  Abdominal:     General: There is no distension.     Palpations: Abdomen is soft.     Tenderness: There is no abdominal tenderness.  Skin:    General: Skin is warm and dry.  Neurological:     Mental Status: She is alert and oriented to person, place, and time.  Psychiatric:        Mood and Affect: Mood normal.        Behavior: Behavior normal.        Thought Content: Thought content normal.        Judgment: Judgment normal.      MD Evaluation Airway: WNL(Edentulous) Heart: WNL Abdomen: WNL Chest/ Lungs: WNL ASA  Classification: 2 Mallampati/Airway Score: Two   Imaging: No results found.  Labs:  CBC: Recent Labs    05/04/19 1145 05/08/19 1423 05/15/19 1215 08/10/19 0809  WBC 8.0 7.1 7.2 7.4  HGB 10.6* 10.6* 11.0* 10.4*  HCT 33.5* 31.5* 34.6* 32.6*  PLT 186 169 189 172    COAGS: Recent Labs    05/08/19 1423 05/15/19 1215 08/10/19 0809  INR 0.9 1.0 1.0    BMP: Recent Labs    12/26/18 1157 05/15/19 1348 08/10/19 0809  NA 137 135 137  K 4.8 5.9* 5.3*  CL 99 99 104  CO2 24 22 21*  GLUCOSE 142* 108* 130*  BUN 53* 54* 83*  CALCIUM 9.2 9.0 8.9  CREATININE 7.36* 8.77* 10.80*  GFRNONAA 6* 5* 4*  GFRAA 7* 6* 4*    LIVER FUNCTION  TESTS: Recent Labs    05/08/19 1423  BILITOT 0.3  AST 15  ALT 13  PROT 7.0    TUMOR MARKERS: No results for input(s): AFPTM, CEA, CA199, CHROMGRNA in the last 8760 hours.  Assessment and Plan:  49 y/o F with history of ESRD on HD via RUE AV graft who presents today for a fistulagram with possible intervention due to clotted access.  Patient has been NPO since 11 pm, she takes Eliquis with last dose last night. Afebrile,BMP pending at time of this note writing and will be reviewed prior to procedure.   Risks and benefits discussed with the patient including, but not limited to bleeding, infection, vascular injury, pulmonary embolism, need for tunneled HD catheter placement or even death.  All of  the patient's questions were answered, patient is agreeable to proceed.  Consent signed and in chart.  Thank you for this interesting consult.  I greatly enjoyed meeting Kara Knapp and look forward to participating in their care.  A copy of this report was sent to the requesting provider on this date.  Electronically Signed: Joaquim Nam, PA-C 10/09/2019, 9:39 AM   I spent a total of 25 Minutes in face to face in clinical consultation, greater than 50% of which was counseling/coordinating care for RUE AVG declot.

## 2019-10-10 DIAGNOSIS — D631 Anemia in chronic kidney disease: Secondary | ICD-10-CM | POA: Diagnosis not present

## 2019-10-10 DIAGNOSIS — D509 Iron deficiency anemia, unspecified: Secondary | ICD-10-CM | POA: Diagnosis not present

## 2019-10-10 DIAGNOSIS — N186 End stage renal disease: Secondary | ICD-10-CM | POA: Diagnosis not present

## 2019-10-10 DIAGNOSIS — Z992 Dependence on renal dialysis: Secondary | ICD-10-CM | POA: Diagnosis not present

## 2019-10-10 DIAGNOSIS — N250 Renal osteodystrophy: Secondary | ICD-10-CM | POA: Diagnosis not present

## 2019-10-12 ENCOUNTER — Ambulatory Visit (INDEPENDENT_AMBULATORY_CARE_PROVIDER_SITE_OTHER): Payer: Medicare Other | Admitting: Psychiatry

## 2019-10-12 ENCOUNTER — Encounter (HOSPITAL_COMMUNITY): Payer: Self-pay | Admitting: Psychiatry

## 2019-10-12 ENCOUNTER — Other Ambulatory Visit: Payer: Self-pay

## 2019-10-12 ENCOUNTER — Ambulatory Visit (INDEPENDENT_AMBULATORY_CARE_PROVIDER_SITE_OTHER): Payer: Medicare Other | Admitting: Clinical

## 2019-10-12 DIAGNOSIS — F3341 Major depressive disorder, recurrent, in partial remission: Secondary | ICD-10-CM | POA: Diagnosis not present

## 2019-10-12 DIAGNOSIS — F331 Major depressive disorder, recurrent, moderate: Secondary | ICD-10-CM

## 2019-10-12 MED ORDER — ARIPIPRAZOLE 5 MG PO TABS: 5.0000 mg | ORAL_TABLET | Freq: Every day | ORAL | 1 refills | Status: DC

## 2019-10-12 MED ORDER — SERTRALINE HCL 100 MG PO TABS: 150.0000 mg | ORAL_TABLET | Freq: Every day | ORAL | 0 refills | Status: DC

## 2019-10-12 NOTE — Patient Instructions (Signed)
1.Continuesertraline 150 mg daily 2. Increase Abilify 5 mg at night  3. Next appointment: 4/26 at 10 AM

## 2019-10-12 NOTE — Progress Notes (Signed)
Virtual Visit via Video Note  I connected with Kara Knapp on 10/12/19 at  4:00 PM EDT by a video enabled telemedicine application and verified that I am speaking with the correct person using two identifiers.  Location: Patient: Home Provider: Office    I discussed the limitations of evaluation and management by telemedicine and the availability of in person appointments. The patient expressed understanding and agreed to proceed.           THERAPIST PROGRESS NOTE  Session Time: 4:00PM-4:40PM  Participation Level: Active  Behavioral Response: CasualAlertDepressed  Type of Therapy: Individual Therapy  Treatment Goals addressed: Coping  Interventions: CBT  Summary: Kara Knapp is a 49 y.o. female who presents with Depression. The OPT therapist worked with the patient for her ongoing OPT treatment session. The OPT therapist utilized Motivational Interviewing to assist in creating therapeutic repore. The patient in the session was engaged and work in collaboration giving feedback about her triggers and symptoms over the past few weeks including struggling with having consistency with a home health aid and in home health workers. The OPT therapist utilized Cognitive Behavioral Therapy through cognitive restructuring as well as worked with the patient on coping strategies to assist in management of mood. The OPT therapist inquired for holistic care about the patients adherence to medication therapy.  Suicidal/Homicidal: Nowithout intent/plan  Therapist Response: The OPT therapist worked with the patient for the patients scheduled session. The patient was engaged in his session and gave feedback in relation to triggers, symptoms, and behavior responses over the past few weeks. The OPT therapist worked with the patient utilizing an in session Cognitive Behavioral Therapy exercise. The patient was responsive in the session and verbalized, " I am going to keep following the path  and see if things get better with the medicine and health aid workers, then I will be able to focus more on my health appointments". The patient indicated both compliance and effectiveness in relation to her current medication therapy and noted a  recent increase (from 13m to 521m with her Abilify. The OPT therapist will continue treatment work with the patient in her next scheduled session  Plan: Return again in 2 weeks.  Diagnosis: Axis I: MDD (major depressive disorder), recurrent, in partial remission      Axis II: No diagnosis  I discussed the assessment and treatment plan with the patient. The patient was provided an opportunity to ask questions and all were answered. The patient agreed with the plan and demonstrated an understanding of the instructions.   The patient was advised to call back or seek an in-person evaluation if the symptoms worsen or if the condition fails to improve as anticipated.  I provided 40 minutes of non-face-to-face time during this encounter.  TeLennox GrumblesLCSW 10/12/2019

## 2019-10-13 DIAGNOSIS — Z992 Dependence on renal dialysis: Secondary | ICD-10-CM | POA: Diagnosis not present

## 2019-10-13 DIAGNOSIS — N250 Renal osteodystrophy: Secondary | ICD-10-CM | POA: Diagnosis not present

## 2019-10-13 DIAGNOSIS — N186 End stage renal disease: Secondary | ICD-10-CM | POA: Diagnosis not present

## 2019-10-13 DIAGNOSIS — D509 Iron deficiency anemia, unspecified: Secondary | ICD-10-CM | POA: Diagnosis not present

## 2019-10-13 DIAGNOSIS — D631 Anemia in chronic kidney disease: Secondary | ICD-10-CM | POA: Diagnosis not present

## 2019-10-14 ENCOUNTER — Ambulatory Visit (HOSPITAL_COMMUNITY): Payer: Medicare Other

## 2019-10-15 DIAGNOSIS — N186 End stage renal disease: Secondary | ICD-10-CM | POA: Diagnosis not present

## 2019-10-15 DIAGNOSIS — Z992 Dependence on renal dialysis: Secondary | ICD-10-CM | POA: Diagnosis not present

## 2019-10-15 DIAGNOSIS — D509 Iron deficiency anemia, unspecified: Secondary | ICD-10-CM | POA: Diagnosis not present

## 2019-10-15 DIAGNOSIS — D631 Anemia in chronic kidney disease: Secondary | ICD-10-CM | POA: Diagnosis not present

## 2019-10-15 DIAGNOSIS — N250 Renal osteodystrophy: Secondary | ICD-10-CM | POA: Diagnosis not present

## 2019-10-17 DIAGNOSIS — D631 Anemia in chronic kidney disease: Secondary | ICD-10-CM | POA: Diagnosis not present

## 2019-10-17 DIAGNOSIS — D509 Iron deficiency anemia, unspecified: Secondary | ICD-10-CM | POA: Diagnosis not present

## 2019-10-17 DIAGNOSIS — N186 End stage renal disease: Secondary | ICD-10-CM | POA: Diagnosis not present

## 2019-10-17 DIAGNOSIS — N250 Renal osteodystrophy: Secondary | ICD-10-CM | POA: Diagnosis not present

## 2019-10-17 DIAGNOSIS — Z992 Dependence on renal dialysis: Secondary | ICD-10-CM | POA: Diagnosis not present

## 2019-10-19 ENCOUNTER — Ambulatory Visit (HOSPITAL_COMMUNITY): Payer: Medicare Other | Admitting: Psychiatry

## 2019-10-20 DIAGNOSIS — D631 Anemia in chronic kidney disease: Secondary | ICD-10-CM | POA: Diagnosis not present

## 2019-10-20 DIAGNOSIS — Z794 Long term (current) use of insulin: Secondary | ICD-10-CM | POA: Diagnosis not present

## 2019-10-20 DIAGNOSIS — N250 Renal osteodystrophy: Secondary | ICD-10-CM | POA: Diagnosis not present

## 2019-10-20 DIAGNOSIS — Z992 Dependence on renal dialysis: Secondary | ICD-10-CM | POA: Diagnosis not present

## 2019-10-20 DIAGNOSIS — E119 Type 2 diabetes mellitus without complications: Secondary | ICD-10-CM | POA: Diagnosis not present

## 2019-10-20 DIAGNOSIS — D509 Iron deficiency anemia, unspecified: Secondary | ICD-10-CM | POA: Diagnosis not present

## 2019-10-20 DIAGNOSIS — N186 End stage renal disease: Secondary | ICD-10-CM | POA: Diagnosis not present

## 2019-10-21 ENCOUNTER — Other Ambulatory Visit: Payer: Self-pay

## 2019-10-21 ENCOUNTER — Ambulatory Visit (INDEPENDENT_AMBULATORY_CARE_PROVIDER_SITE_OTHER): Payer: Medicare Other | Admitting: Physician Assistant

## 2019-10-21 ENCOUNTER — Other Ambulatory Visit (HOSPITAL_COMMUNITY): Payer: Self-pay | Admitting: Physician Assistant

## 2019-10-21 ENCOUNTER — Ambulatory Visit (HOSPITAL_COMMUNITY)
Admission: RE | Admit: 2019-10-21 | Discharge: 2019-10-21 | Disposition: A | Discharge status: Home / Self Care | Payer: Medicare Other | Source: Ambulatory Visit | Attending: Vascular Surgery | Admitting: Vascular Surgery

## 2019-10-21 ENCOUNTER — Ambulatory Visit (HOSPITAL_COMMUNITY)
Admission: RE | Admit: 2019-10-21 | Discharge: 2019-10-21 | Disposition: A | Discharge status: Home / Self Care | Payer: Medicare Other | Source: Ambulatory Visit

## 2019-10-21 ENCOUNTER — Encounter (HOSPITAL_COMMUNITY): Payer: Self-pay

## 2019-10-21 DIAGNOSIS — Z992 Dependence on renal dialysis: Secondary | ICD-10-CM | POA: Diagnosis not present

## 2019-10-21 DIAGNOSIS — N186 End stage renal disease: Secondary | ICD-10-CM

## 2019-10-21 NOTE — Progress Notes (Signed)
Established Dialysis Access   History of Present Illness   Kara Knapp is a 49 y.o. ([birth year]) female who presents for re-evaluation for permanent access. Patient recently had right IJ TDC placed due to occluded right arm AV graft. Patient states right arm AV graft has required a declotting procedure about every two or 3 months since it was placed about 4 years ago. Prior to right arm AV graft placement she had bilateral upper extremity venogram which demonstrated no usable vein for fistula creation. This also noted severe narrowing of left axillary vein. She is dialyzing from right IJ [facility] on a Tuesday Thursday Saturday schedule without complication. Patient is aware that she may in the future require lower extremity dialysis access.   The patient's PMH, PSH, SH, and FamHx were reviewed are unchanged from prior visit.  Current Outpatient Medications  Medication Sig Dispense Refill  . apixaban (ELIQUIS) 2.5 MG TABS tablet Take 2.5 mg by mouth 2 (two) times daily.    . ARIPiprazole (ABILIFY) 2 MG tablet Take 1 tablet (2 mg total) by mouth daily. 90 tablet 0  . ARIPiprazole (ABILIFY) 5 MG tablet Take 1 tablet (5 mg total) by mouth daily. 30 tablet 1  . azelastine (ASTELIN) 0.1 % nasal spray Place 1 spray into both nostrils 2 (two) times daily. Use in each nostril as directed (Patient taking differently: 1 spray as needed. Use in each nostril as directed) 30 mL 12  . brimonidine-timolol (COMBIGAN) 0.2-0.5 % ophthalmic solution Place 1 drop into both eyes daily.     . cholecalciferol (VITAMIN D) 1000 units tablet Take 1,000 Units by mouth daily.     . clindamycin (CLEOCIN) 300 MG capsule     . Continuous Blood Gluc Sensor (FREESTYLE LIBRE 14 DAY SENSOR) MISC Inject 1 each into the skin every 14 (fourteen) days. Use as directed. 2 each 2  . cyclobenzaprine (FLEXERIL) 5 MG tablet TAKE ONE TABLET BY MOUTH THREE TIMES DAILY AS NEEDED FOR MUSCLE SPASM 60 tablet 3  . diclofenac sodium (VOLTAREN)  1 % GEL Apply 2 g topically 4 (four) times daily. (Patient taking differently: Apply 2 g topically 4 (four) times daily as needed (pain). ) 200 g 3  . gabapentin (NEURONTIN) 100 MG capsule TAKE ONE CAPSULE BY MOUTH THREE TIMES DAILY. 90 capsule 0  . glucose blood (PRODIGY NO CODING BLOOD GLUC) test strip USE TO CHECK BLOOD SUGAR TWICE DAILY. Dx E11.22 200 each 3  . Insulin Pen Needle (TRUEPLUS PEN NEEDLES) 31G X 5 MM MISC Use daily with insulin Dx E11.22 100 each 3  . LANTUS SOLOSTAR 100 UNIT/ML Solostar Pen INJECT 18-50 UNITS SUBCUTANEOUSLY AT BEDTIME. 15 mL 0  . lidocaine-prilocaine (EMLA) cream Apply 1 application topically daily as needed (arm port).    Marland Kitchen linagliptin (TRADJENTA) 5 MG TABS tablet Take 1 tablet (5 mg total) by mouth daily. 90 tablet 3  . multivitamin (RENA-VIT) TABS tablet Take 1 tablet by mouth daily.    . pantoprazole (PROTONIX) 40 MG tablet TAKE 1 BY MOUTH DAILY 30 minutes before breakfast (Patient taking differently: Take 40 mg by mouth daily. ) 90 tablet 3  . Probiotic Product (CULTURELLE PROBIOTICS PO) Take 1 capsule by mouth daily.     Marland Kitchen PRODIGY TWIST TOP LANCETS 28G MISC USE TO CHECK BLOOD SUGAR UP TO FOUR TIMES DAILY. 100 each 1  . [START ON 11/17/2019] sertraline (ZOLOFT) 100 MG tablet Take 1.5 tablets (150 mg total) by mouth daily. 135 tablet 0  .  sevelamer carbonate (RENVELA) 800 MG tablet Take 2,400 mg by mouth See admin instructions. Take 2493m by mouth three times daily with means and 8043mwith snacks    . traZODone (DESYREL) 50 MG tablet Take 0.5-1 tablets (25-50 mg total) by mouth at bedtime as needed. for sleep (Patient taking differently: Take 25 mg by mouth at bedtime as needed for sleep. ) 90 tablet 3  . vitamin E (VITAMIN E) 400 UNIT capsule Take 1 capsule (400 Units total) by mouth daily. 90 capsule 1   No current facility-administered medications for this visit.    REVIEW OF SYSTEMS (negative unless checked):   Cardiac:  []  Chest pain or chest  pressure? []  Shortness of breath upon activity? []  Shortness of breath when lying flat? []  Irregular heart rhythm?  Vascular:  []  Pain in calf, thigh, or hip brought on by walking? []  Pain in feet at night that wakes you up from your sleep? []  Blood clot in your veins? []  Leg swelling?  Pulmonary:  []  Oxygen at home? []  Productive cough? []  Wheezing?  Neurologic:  []  Sudden weakness in arms or legs? []  Sudden numbness in arms or legs? []  Sudden onset of difficult speaking or slurred speech? []  Temporary loss of vision in one eye? []  Problems with dizziness?  Gastrointestinal:  []  Blood in stool? []  Vomited blood?  Genitourinary:  []  Burning when urinating? []  Blood in urine?  Psychiatric:  []  Major depression  Hematologic:  []  Bleeding problems? []  Problems with blood clotting?  Dermatologic:  []  Rashes or ulcers?  Constitutional:  []  Fever or chills?  Ear/Nose/Throat:  []  Change in hearing? []  Nose bleeds? []  Sore throat?  Musculoskeletal:  []  Back pain? []  Joint pain? []  Muscle pain?   Physical Examination   General:  WDWN in NAD; vital signs documented above Gait: Not observed HENT: WNL, normocephalic Pulmonary: normal non-labored breathing , without Rales, rhonchi,  wheezing Cardiac: regular HR Abdomen: soft, NT, no masses Skin: without rashes Vascular Exam/Pulses:  Right Left  Radial 2+ (normal) 2+ (normal)   Extremities: without ischemic changes, without Gangrene , without cellulitis; without open wounds;  Musculoskeletal: no muscle wasting or atrophy  Neurologic: A&O X 3;  No focal weakness or paresthesias are detected Psychiatric:  The pt has Normal affect.   Non-invasive Vascular Imaging   Occluded R arm AVG on ultrasound    Medical Decision Making   Kara Knapp is a [age].o. female who presents with ESRD requiring hemodialysis.   -Right arm AV graft occluded -Interventional radiology recently placed right IJ  TDC -Thrombus versus high-grade stenosis of left axillary vein noted on 2016 venogram -Before we move to a lower extremity access, we will repeat bilateral upper extremity venogram to rule out any possible upper extremity dialysis access -Patient is agreeable to this procedure and all questions were answered -This will be scheduled for next available surgeon on a nondialysis day   MaDagoberto LigasA-C [facility] [phone] MD: DiScot Dock

## 2019-10-22 DIAGNOSIS — N186 End stage renal disease: Secondary | ICD-10-CM | POA: Diagnosis not present

## 2019-10-22 DIAGNOSIS — N250 Renal osteodystrophy: Secondary | ICD-10-CM | POA: Diagnosis not present

## 2019-10-22 DIAGNOSIS — D631 Anemia in chronic kidney disease: Secondary | ICD-10-CM | POA: Diagnosis not present

## 2019-10-22 DIAGNOSIS — D509 Iron deficiency anemia, unspecified: Secondary | ICD-10-CM | POA: Diagnosis not present

## 2019-10-22 DIAGNOSIS — Z992 Dependence on renal dialysis: Secondary | ICD-10-CM | POA: Diagnosis not present

## 2019-10-23 ENCOUNTER — Other Ambulatory Visit: Payer: Self-pay

## 2019-10-27 DIAGNOSIS — N186 End stage renal disease: Secondary | ICD-10-CM | POA: Diagnosis not present

## 2019-10-27 DIAGNOSIS — N250 Renal osteodystrophy: Secondary | ICD-10-CM | POA: Diagnosis not present

## 2019-10-27 DIAGNOSIS — Z992 Dependence on renal dialysis: Secondary | ICD-10-CM | POA: Diagnosis not present

## 2019-10-27 DIAGNOSIS — D509 Iron deficiency anemia, unspecified: Secondary | ICD-10-CM | POA: Diagnosis not present

## 2019-10-27 DIAGNOSIS — D631 Anemia in chronic kidney disease: Secondary | ICD-10-CM | POA: Diagnosis not present

## 2019-10-28 DIAGNOSIS — N186 End stage renal disease: Secondary | ICD-10-CM | POA: Diagnosis not present

## 2019-10-28 DIAGNOSIS — Z992 Dependence on renal dialysis: Secondary | ICD-10-CM | POA: Diagnosis not present

## 2019-10-29 ENCOUNTER — Other Ambulatory Visit (HOSPITAL_COMMUNITY)
Admission: RE | Admit: 2019-10-29 | Discharge: 2019-10-29 | Disposition: A | Discharge status: Home / Self Care | Payer: Medicare Other | Source: Ambulatory Visit | Attending: Vascular Surgery | Admitting: Vascular Surgery

## 2019-10-29 ENCOUNTER — Other Ambulatory Visit: Payer: Self-pay

## 2019-10-29 ENCOUNTER — Other Ambulatory Visit (HOSPITAL_COMMUNITY): Payer: Self-pay | Admitting: *Deleted

## 2019-10-29 DIAGNOSIS — N186 End stage renal disease: Secondary | ICD-10-CM | POA: Diagnosis not present

## 2019-10-29 DIAGNOSIS — Z01812 Encounter for preprocedural laboratory examination: Secondary | ICD-10-CM | POA: Diagnosis not present

## 2019-10-29 DIAGNOSIS — Z20822 Contact with and (suspected) exposure to covid-19: Secondary | ICD-10-CM | POA: Insufficient documentation

## 2019-10-29 DIAGNOSIS — D631 Anemia in chronic kidney disease: Secondary | ICD-10-CM | POA: Diagnosis not present

## 2019-10-29 DIAGNOSIS — N250 Renal osteodystrophy: Secondary | ICD-10-CM | POA: Diagnosis not present

## 2019-10-29 DIAGNOSIS — Z992 Dependence on renal dialysis: Secondary | ICD-10-CM | POA: Diagnosis not present

## 2019-10-29 DIAGNOSIS — R7989 Other specified abnormal findings of blood chemistry: Secondary | ICD-10-CM

## 2019-10-30 ENCOUNTER — Other Ambulatory Visit (HOSPITAL_COMMUNITY): Payer: Medicare Other

## 2019-10-30 LAB — SARS CORONAVIRUS 2 (TAT 6-24 HRS): SARS Coronavirus 2: NEGATIVE

## 2019-10-31 DIAGNOSIS — N186 End stage renal disease: Secondary | ICD-10-CM | POA: Diagnosis not present

## 2019-10-31 DIAGNOSIS — D631 Anemia in chronic kidney disease: Secondary | ICD-10-CM | POA: Diagnosis not present

## 2019-10-31 DIAGNOSIS — N250 Renal osteodystrophy: Secondary | ICD-10-CM | POA: Diagnosis not present

## 2019-10-31 DIAGNOSIS — Z992 Dependence on renal dialysis: Secondary | ICD-10-CM | POA: Diagnosis not present

## 2019-11-02 ENCOUNTER — Inpatient Hospital Stay (HOSPITAL_COMMUNITY): Payer: Medicare Other | Attending: Hematology

## 2019-11-02 ENCOUNTER — Ambulatory Visit (HOSPITAL_COMMUNITY)
Admission: RE | Admit: 2019-11-02 | Discharge: 2019-11-02 | Disposition: A | Discharge status: Home / Self Care | Payer: Medicare Other | Source: Ambulatory Visit | Attending: Vascular Surgery | Admitting: Vascular Surgery

## 2019-11-02 ENCOUNTER — Encounter (HOSPITAL_COMMUNITY)
Admission: RE | Disposition: A | Discharge status: Home / Self Care | Payer: Self-pay | Source: Ambulatory Visit | Attending: Vascular Surgery

## 2019-11-02 ENCOUNTER — Other Ambulatory Visit: Payer: Self-pay

## 2019-11-02 DIAGNOSIS — Z79899 Other long term (current) drug therapy: Secondary | ICD-10-CM | POA: Diagnosis not present

## 2019-11-02 DIAGNOSIS — Z992 Dependence on renal dialysis: Secondary | ICD-10-CM | POA: Diagnosis not present

## 2019-11-02 DIAGNOSIS — D631 Anemia in chronic kidney disease: Secondary | ICD-10-CM | POA: Insufficient documentation

## 2019-11-02 DIAGNOSIS — E785 Hyperlipidemia, unspecified: Secondary | ICD-10-CM | POA: Insufficient documentation

## 2019-11-02 DIAGNOSIS — F329 Major depressive disorder, single episode, unspecified: Secondary | ICD-10-CM | POA: Diagnosis not present

## 2019-11-02 DIAGNOSIS — E1122 Type 2 diabetes mellitus with diabetic chronic kidney disease: Secondary | ICD-10-CM | POA: Insufficient documentation

## 2019-11-02 DIAGNOSIS — E1140 Type 2 diabetes mellitus with diabetic neuropathy, unspecified: Secondary | ICD-10-CM | POA: Insufficient documentation

## 2019-11-02 DIAGNOSIS — F17210 Nicotine dependence, cigarettes, uncomplicated: Secondary | ICD-10-CM | POA: Insufficient documentation

## 2019-11-02 DIAGNOSIS — N186 End stage renal disease: Secondary | ICD-10-CM | POA: Diagnosis not present

## 2019-11-02 DIAGNOSIS — Z794 Long term (current) use of insulin: Secondary | ICD-10-CM | POA: Diagnosis not present

## 2019-11-02 DIAGNOSIS — I5032 Chronic diastolic (congestive) heart failure: Secondary | ICD-10-CM | POA: Diagnosis not present

## 2019-11-02 DIAGNOSIS — Z7901 Long term (current) use of anticoagulants: Secondary | ICD-10-CM | POA: Insufficient documentation

## 2019-11-02 DIAGNOSIS — I132 Hypertensive heart and chronic kidney disease with heart failure and with stage 5 chronic kidney disease, or end stage renal disease: Secondary | ICD-10-CM | POA: Diagnosis not present

## 2019-11-02 DIAGNOSIS — N185 Chronic kidney disease, stage 5: Secondary | ICD-10-CM | POA: Diagnosis not present

## 2019-11-02 HISTORY — PX: UPPER EXTREMITY VENOGRAPHY: CATH118272

## 2019-11-02 LAB — POCT I-STAT, CHEM 8
BUN: 45 mg/dL — ABNORMAL HIGH (ref 6–20)
Calcium, Ion: 1.13 mmol/L — ABNORMAL LOW (ref 1.15–1.40)
Chloride: 100 mmol/L (ref 98–111)
Creatinine, Ser: 8.5 mg/dL — ABNORMAL HIGH (ref 0.44–1.00)
Glucose, Bld: 241 mg/dL — ABNORMAL HIGH (ref 70–99)
HCT: 27 % — ABNORMAL LOW (ref 36.0–46.0)
Hemoglobin: 9.2 g/dL — ABNORMAL LOW (ref 12.0–15.0)
Potassium: 4.6 mmol/L (ref 3.5–5.1)
Sodium: 137 mmol/L (ref 135–145)
TCO2: 28 mmol/L (ref 22–32)

## 2019-11-02 SURGERY — UPPER EXTREMITY VENOGRAPHY
Anesthesia: LOCAL | Laterality: Bilateral

## 2019-11-02 MED ORDER — IODIXANOL 320 MG/ML IV SOLN
INTRAVENOUS | Status: DC | PRN
  Administered 2019-11-02: 08:00:00 20 mL

## 2019-11-02 MED ORDER — SODIUM CHLORIDE 0.9 % IV SOLN: INTRAVENOUS | Status: DC

## 2019-11-02 MED ORDER — LABETALOL HCL 5 MG/ML IV SOLN: 10.0000 mg | INTRAVENOUS | Status: DC | PRN

## 2019-11-02 MED ORDER — LABETALOL HCL 5 MG/ML IV SOLN
20.0000 mg | INTRAVENOUS | Status: DC | PRN
  Administered 2019-11-02: 20 mg via INTRAVENOUS
  Filled 2019-11-02: qty 4

## 2019-11-02 SURGICAL SUPPLY — 4 items
PROTECTION STATION PRESSURIZED (MISCELLANEOUS) ×2
STATION PROTECTION PRESSURIZED (MISCELLANEOUS) ×1 IMPLANT
STOPCOCK MORSE 400PSI 3WAY (MISCELLANEOUS) ×4 IMPLANT
TUBING CIL FLEX 10 FLL-RA (TUBING) ×2 IMPLANT

## 2019-11-02 NOTE — Discharge Instructions (Signed)
Venogram A venogram, or venography, is a procedure that uses an X-ray and dye (contrast) to examine how well the veins work and how blood flows through them. Contrast helps the veins show up on X-rays. A venogram may be done:  To evaluate abnormalities in the vein.  To identify clots within veins, such as deep vein thrombosis (DVT).  To map out the veins that might be needed for another procedure. Tell a health care provider about:  Any allergies you have, especially to medicines, shellfish, iodine, and contrast.  All medicines you are taking, including vitamins, herbs, eye drops, creams, and over-the-counter medicines.  Any problems you or family members have had with anesthetic medicines.  Any blood disorders you have.  Any surgeries you have had and any complications that occurred.  Any medical conditions you have.  Whether you are pregnant, may be pregnant, or are breastfeeding.  Any history of smoking or tobacco use. What are the risks? Generally, this is a safe procedure. However, problems may occur, including:  Infection.  Bleeding.  Blood clots.  Allergic reaction to medicines or contrast.  Damage to other structures or organs.  Kidney problems.  Increased risk of cancer. Being exposed to too much radiation over a lifetime can increase the risk of cancer. The risk is small. What happens before the procedure? Medicines Ask your health care provider about:  Changing or stopping your regular medicines. This is especially important if you are taking diabetes medicines or blood thinners.  Taking medicines such as aspirin and ibuprofen. These medicines can thin your blood. Do not take these medicines unless your health care provider tells you to take them.  Taking over-the-counter medicines, vitamins, herbs, and supplements. General instructions  Follow instructions from your health care provider about eating or drinking restrictions.  You may have blood tests  to check how well your kidneys and liver are working and how well your blood can clot.  Plan to have someone take you home from the hospital or clinic. What happens during the procedure?   An IV will be inserted into one of your veins.  You may be given a medicine to help you relax (sedative).  You will lie down on an X-ray table. The table may be tilted in different directions during the procedure to help the contrast move throughout your body. Safety straps will keep you secure if the table is tilted.  If veins in your arm or leg will be examined, a band may be wrapped around that arm or leg to keep the veins full of blood. This may cause your arm or leg to feel numb.  The contrast will be injected into your IV. You may have a hot, flushed feeling as it moves throughout your body. You may also have a metallic taste in your mouth. Both of these sensations will go away after the test is complete.  You may be asked to lie in different positions or place your legs or arms in different positions.  At the end of the procedure, you may be given IV fluids to help wash or flush the contrast out of your veins.  The IV will be removed, and pressure will be applied to the IV site to prevent bleeding. A bandage (dressing) may be applied to the IV site. The exact procedure may vary among health care providers and hospitals. What can I expect after the procedure?  Your blood pressure, heart rate, breathing rate, and blood oxygen level will be monitored until you   leave the hospital or clinic.  You may be given something to eat and drink.  You may have bruising or mild discomfort in the area where the IV was inserted. Follow these instructions at home: Eating and drinking   Follow instructions from your health care provider about eating or drinking restrictions.  Drink a lot of water for the first several days after the procedure, as directed by your health care provider. This helps to flush the  contrast out of your body. Activity  Rest as told by your health care provider.  Return to your normal activities as told by your health care provider. Ask your health care provider what activities are safe for you.  If you were given a sedative during your procedure, do not drive for 24 hours or until your health care provider approves. General instructions  Check your IV insertion area every day for signs of infection. Check for: ? Redness, swelling, or pain. ? Fluid or blood. ? Warmth. ? Pus or a bad smell.  Take over-the-counter and prescription medicines only as told by your health care provider.  Keep all follow-up visits as told by your health care provider. This is important. Contact a health care provider if:  Your skin becomes itchy or you develop a rash or hives.  You have a fever that does not get better with medicine.  You feel nauseous or you vomit.  You have redness, swelling, or pain around the insertion site.  You have fluid or blood coming from the insertion site.  Your insertion area feels warm to the touch.  You have pus or a bad smell coming from the insertion site. Get help right away if you:  Have shortness of breath or difficulty breathing.  Develop chest pain.  Faint.  Feel very dizzy. These symptoms may represent a serious problem that is an emergency. Do not wait to see if the symptoms will go away. Get medical help right away. Call your local emergency services (911 in the [place] Do not drive yourself to the hospital. Summary  A venogram, or venography, is a procedure that uses an X-ray and contrast dye to check how well the veins work and how blood flows through them.  An IV will be inserted into one of your veins in order to inject the contrast.  During the exam, you will lie on an X-ray table. The table may be tilted in different directions during the procedure to help the contrast move throughout your body. Safety straps will keep you  secure.  After the procedure, you will need to drink a lot of water to help wash or flush the contrast out of your body. This information is not intended to replace advice given to you by your health care provider. Make sure you discuss any questions you have with your health care provider. Document Revised: 02/21/2019 Document Reviewed: 02/21/2019 Elsevier Patient Education  2020 [employer]  

## 2019-11-02 NOTE — Op Note (Signed)
    Patient name: Kara Knapp MRN: [id number] DOB: 1971-06-13 Sex: female  11/02/2019 Pre-operative Diagnosis: esrd Post-operative diagnosis:  Same Surgeon:  Erlene Quan C. Donzetta Matters, MD Procedure Performed:  Bilateral upper extremity venography  Indications: 49 year old female with end-stage renal disease currently dialyzing via catheter.  She has right upper extremity grafting in the past which has failed after multiple declots.  She is now indicated for bilateral upper extremity venography.  Findings: On the right side there is an axillary stent likely from the graft into the vein which is occluded.  She does reconstitute axillary subclavian vein and has patent innominate vein central venous structures.  There is a catheter in place.  On the left side she has patent axillary and subclavian veins and centrally she is patent.  We will plan for left upper arm AV graft in the near future.   Procedure:  The patient was identified in the holding area and taken to room 8.  The patient was then placed supine on the table and prepped and draped in the usual sterile fashion.  A time out was called.  Contrast was injected through existing bilateral upper extremity IVs.  The above findings were noted and she will be planned for left arm AV graft.   Contrast: 20cc  Meldrick Buttery C. Donzetta Matters, MD [facility] Office: [phone] Pager: [phone]

## 2019-11-02 NOTE — H&P (Signed)
   History and Physical Update  The patient was interviewed and re-examined.  The patient's previous History and Physical has been reviewed and is unchanged from recent office visit. Plan for bilateral upper extremity venography to plan future access.   Demontrae Gilbert C. Donzetta Matters, MD [facility] [place] Office: [phone] Pager: [phone]   11/02/2019, 7:28 AM

## 2019-11-02 NOTE — Progress Notes (Signed)
Patient ambulated to restroom without difficulty and with minimal assistance. Steady gait.

## 2019-11-02 NOTE — Progress Notes (Signed)
Dr. Donzetta Matters made aware of patient's BP 218/97. New orders for 65m labetalol to lower blood pressure closer to baseline 190/90 per MD. Will continue to monitor patient's BP. Updated patient's son about delay in discharge time.

## 2019-11-03 ENCOUNTER — Other Ambulatory Visit: Payer: Self-pay

## 2019-11-03 DIAGNOSIS — N186 End stage renal disease: Secondary | ICD-10-CM | POA: Diagnosis not present

## 2019-11-03 DIAGNOSIS — D631 Anemia in chronic kidney disease: Secondary | ICD-10-CM | POA: Diagnosis not present

## 2019-11-03 DIAGNOSIS — Z992 Dependence on renal dialysis: Secondary | ICD-10-CM | POA: Diagnosis not present

## 2019-11-03 DIAGNOSIS — N250 Renal osteodystrophy: Secondary | ICD-10-CM | POA: Diagnosis not present

## 2019-11-04 DIAGNOSIS — Z23 Encounter for immunization: Secondary | ICD-10-CM | POA: Diagnosis not present

## 2019-11-05 DIAGNOSIS — N250 Renal osteodystrophy: Secondary | ICD-10-CM | POA: Diagnosis not present

## 2019-11-05 DIAGNOSIS — D631 Anemia in chronic kidney disease: Secondary | ICD-10-CM | POA: Diagnosis not present

## 2019-11-05 DIAGNOSIS — Z992 Dependence on renal dialysis: Secondary | ICD-10-CM | POA: Diagnosis not present

## 2019-11-05 DIAGNOSIS — N186 End stage renal disease: Secondary | ICD-10-CM | POA: Diagnosis not present

## 2019-11-09 ENCOUNTER — Ambulatory Visit (HOSPITAL_COMMUNITY): Payer: Medicare Other | Admitting: Hematology

## 2019-11-10 DIAGNOSIS — Z992 Dependence on renal dialysis: Secondary | ICD-10-CM | POA: Diagnosis not present

## 2019-11-10 DIAGNOSIS — N186 End stage renal disease: Secondary | ICD-10-CM | POA: Diagnosis not present

## 2019-11-10 DIAGNOSIS — D631 Anemia in chronic kidney disease: Secondary | ICD-10-CM | POA: Diagnosis not present

## 2019-11-10 DIAGNOSIS — N250 Renal osteodystrophy: Secondary | ICD-10-CM | POA: Diagnosis not present

## 2019-11-11 ENCOUNTER — Encounter (INDEPENDENT_AMBULATORY_CARE_PROVIDER_SITE_OTHER): Payer: Self-pay | Admitting: Ophthalmology

## 2019-11-12 DIAGNOSIS — D631 Anemia in chronic kidney disease: Secondary | ICD-10-CM | POA: Diagnosis not present

## 2019-11-12 DIAGNOSIS — N250 Renal osteodystrophy: Secondary | ICD-10-CM | POA: Diagnosis not present

## 2019-11-12 DIAGNOSIS — N186 End stage renal disease: Secondary | ICD-10-CM | POA: Diagnosis not present

## 2019-11-12 DIAGNOSIS — Z992 Dependence on renal dialysis: Secondary | ICD-10-CM | POA: Diagnosis not present

## 2019-11-14 DIAGNOSIS — D631 Anemia in chronic kidney disease: Secondary | ICD-10-CM | POA: Diagnosis not present

## 2019-11-14 DIAGNOSIS — N250 Renal osteodystrophy: Secondary | ICD-10-CM | POA: Diagnosis not present

## 2019-11-14 DIAGNOSIS — N186 End stage renal disease: Secondary | ICD-10-CM | POA: Diagnosis not present

## 2019-11-14 DIAGNOSIS — Z992 Dependence on renal dialysis: Secondary | ICD-10-CM | POA: Diagnosis not present

## 2019-11-16 ENCOUNTER — Other Ambulatory Visit (HOSPITAL_COMMUNITY)
Admission: RE | Admit: 2019-11-16 | Discharge: 2019-11-16 | Disposition: A | Discharge status: Home / Self Care | Payer: Medicare Other | Source: Ambulatory Visit | Attending: Vascular Surgery | Admitting: Vascular Surgery

## 2019-11-16 ENCOUNTER — Other Ambulatory Visit: Payer: Self-pay

## 2019-11-16 ENCOUNTER — Encounter (HOSPITAL_COMMUNITY): Payer: Self-pay | Admitting: Vascular Surgery

## 2019-11-16 DIAGNOSIS — Z01812 Encounter for preprocedural laboratory examination: Secondary | ICD-10-CM | POA: Insufficient documentation

## 2019-11-16 DIAGNOSIS — Z20822 Contact with and (suspected) exposure to covid-19: Secondary | ICD-10-CM | POA: Diagnosis not present

## 2019-11-16 LAB — SARS CORONAVIRUS 2 (TAT 6-24 HRS): SARS Coronavirus 2: NEGATIVE
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
## 2019-11-16 NOTE — Progress Notes (Signed)
Pt denies any acute pulmonary issues. Pt denies chest pain. Pt stated that she is under the care of Dr. Rozann Lesches, Cardiology and Dr. Brigitte Pulse, PCP. Pt denies having a cardiac cath. Pt denies having an EKG and chest x ray. Pt made aware to stop taking vitamins, fish oil and herbal medications. Do not take any NSAIDs ie: Voltaren Gel, Ibuprofen, Advil, Naproxen (Aleve), Motrin, BC and Goody Powder.  Pt made aware to take only 10 units of Lantus insulin the nihght before surgery.Pt made aware to check CBG every 2 hours prior to arrival to hospital on DOS. Pt made aware to treat a CBG < 70 with 4 ounces of apple  juice, wait 15 minutes after intervention to recheck BG, if BG remains < 70, call Short Stay unit to speak with a nurse. Pt reminded to quarantine. Pt verbalized understanding of all pre-op instructions. Pt also requested that nurse leave pre-op instructions on voice mailbox as well. Pt chart forwarded to PA, Anesthesiology,  for review.

## 2019-11-17 DIAGNOSIS — Z992 Dependence on renal dialysis: Secondary | ICD-10-CM | POA: Diagnosis not present

## 2019-11-17 DIAGNOSIS — N250 Renal osteodystrophy: Secondary | ICD-10-CM | POA: Diagnosis not present

## 2019-11-17 DIAGNOSIS — D631 Anemia in chronic kidney disease: Secondary | ICD-10-CM | POA: Diagnosis not present

## 2019-11-17 DIAGNOSIS — N186 End stage renal disease: Secondary | ICD-10-CM | POA: Diagnosis not present

## 2019-11-17 NOTE — Anesthesia Preprocedure Evaluation (Addendum)
Anesthesia Evaluation  Patient identified by MRN, date of birth, ID band Patient awake    Reviewed: Allergy & Precautions, H&P , NPO status , Patient's Chart, lab work & pertinent test results  Airway Mallampati: II   Neck ROM: full    Dental   Pulmonary asthma , COPD, Current Smoker,    breath sounds clear to auscultation       Cardiovascular hypertension, +CHF   Rhythm:regular Rate:Normal     Neuro/Psych PSYCHIATRIC DISORDERS Anxiety Depression    GI/Hepatic GERD  ,  Endo/Other  diabetes, Type 2, Insulin Dependent  Renal/GU ESRF and DialysisRenal disease     Musculoskeletal   Abdominal   Peds  Hematology   Anesthesia Other Findings   Reproductive/Obstetrics                            Anesthesia Physical Anesthesia Plan  ASA: III  Anesthesia Plan: General   Post-op Pain Management:    Induction: Intravenous  PONV Risk Score and Plan: 2 and Ondansetron, Midazolam and Treatment may vary due to age or medical condition  Airway Management Planned: LMA  Additional Equipment:   Intra-op Plan:   Post-operative Plan: Extubation in OR  Informed Consent: I have reviewed the patients History and Physical, chart, labs and discussed the procedure including the risks, benefits and alternatives for the proposed anesthesia with the patient or authorized representative who has indicated his/her understanding and acceptance.       Plan Discussed with: CRNA, Anesthesiologist and Surgeon  Anesthesia Plan Comments: (PAT note written 11/17/2019 by Myra Gianotti, PA-C. SAME DAY WORK-UP  )       Anesthesia Quick Evaluation

## 2019-11-17 NOTE — Progress Notes (Signed)
Anesthesia Chart Review: SAME DAY WORK-UP    Case: [id number] Date/Time: 11/18/19 1101   Procedure: INSERTION OF ARTERIOVENOUS (AV) GORE-TEX GRAFT ARM (Left )   Anesthesia type: Monitor Anesthesia Care   Pre-op diagnosis: END STAGE RENAL DISEASE   Location: [facility] OR ROOM 11 / [place] [facility]   Surgeons: Rosetta Posner, MD      DISCUSSION: Patient is a 49 year old female scheduled for the above procedure. She is on hemodialysis. As of 11/02/19, she was using tunneled dialysis catheter.   History includes smoking, ESRD (started dialysis 12/29/15), IDDM, COPD, asthma, HLD, anemia, glaucoma  (left eye blindness), HTN, diastolic CHF, liver fibrosis, chronic diarrhea, anxiety.   Last evaluation by cardiologist Dr. Domenic Polite on 09/09/19 (telemedicine). He notes she had been on Eliquis for hemodialysis access patency (frequent clotting of AVGG). Fluid status managed with HD. No additional cardiac testing recommended at that time. As needed cardiology follow-up recommended. She is no longer on Eliquis, likely because she is currently using a catheter for hemodialysis.  Last EKG is > 68 years old.  11/16/2019 presurgical COVID-19 test was negative.  She is a same-day work-up, so anesthesia team to evaluate on the day of surgery.   VS: LMP 05/05/2015  She is for vitals on arrival. Wide range of BP documented between 09/09/19-11/02/19: 106-200/68-99. HR 70's.    PROVIDERS: Dettinger, Fransisca Kaufmann, MD is listed as PCP, but reported Dr. Brigitte Pulse is her PCP Rozann Lesches, MD is cardiologist. As needed follow-up recommended after 09/09/19 visit.    PFTs 08/19/17:  Ref. Range 08/19/2017 10:52  FVC-Pre Latest Units: L 2.00  FVC-%Pred-Pre Latest Units: % 57  FEV1-Pre Latest Units: L 1.68  FEV1-%Pred-Pre Latest Units: % 60  Pre FEV1/FVC ratio Latest Units: % 84  FEV1FVC-%Pred-Pre Latest Units: % 103  FEF 25-75 Pre Latest Units: L/sec 1.84  FEF2575-%Pred-Pre Latest Units: % 65  FEV6-Pre Latest Units: L 2.00  FEV6-%Pred-Pre Latest  Units: % 59  Pre FEV6/FVC Ratio Latest Units: % 100  FEV6FVC-%Pred-Pre Latest Units: % 102  FVC-Post Latest Units: L 2.02  FVC-%Pred-Post Latest Units: % 58  FVC-%Change-Post Latest Units: % 0  FEV1-Post Latest Units: L 1.45  FEV1-%Pred-Post Latest Units: % 52  FEV1-%Change-Post Latest Units: % -13  Post FEV1/FVC ratio Latest Units: % 72  FEV1FVC-%Change-Post Latest Units: % -14  FEF 25-75 Post Latest Units: L/sec 1.10  FEF2575-%Pred-Post Latest Units: % 39  FEF2575-%Change-Post Latest Units: % -40  FEV6-Post Latest Units: L 1.90  FEV6-%Pred-Post Latest Units: % 56  FEV6-%Change-Post Latest Units: % -4  Post FEV6/FVC ratio Latest Units: % 100  FEV6FVC-%Pred-Post Latest Units: % 102  TLC Latest Units: L 3.94  TLC % pred Latest Units: % 80  RV Latest Units: L 1.78  RV % pred Latest Units: % 105  DLCO unc Latest Units: ml/min/mmHg 8.78  DLCO unc % pred Latest Units: % 38  DL/VA Latest Units: ml/min/mmHg/L 2.68  DL/VA % pred Latest Units: % 57     LABS: She is for ISTAT labs on the day of surgery. Last A1c seen was from July 2019.    EKG: Last EKG noted is > 1 year ago.    CV: Echo 09/06/16: Study Conclusions  - Left ventricle: The cavity size was normal. Wall thickness was at  the upper limits of normal. Systolic function was normal. The  estimated ejection fraction was in the range of 55% to 60%. Wall  motion was normal; there were no regional wall motion  abnormalities. Features are  consistent with a pseudonormal left  ventricular filling pattern, with concomitant abnormal relaxation  and increased filling pressure (grade 2 diastolic dysfunction).  - Aortic valve: Mildly calcified annulus. Trileaflet; mildly  calcified leaflets. There was trivial regurgitation.  - Mitral valve: There was mild regurgitation.  - Left atrium: The atrium was moderately dilated.  - Right ventricle: The cavity size was mildly dilated. Systolic  function was low normal.  -  Right atrium: Central venous pressure (est): 15 mm Hg.  - Atrial septum: No defect or patent foramen ovale was identified.  - Tricuspid valve: There was moderate regurgitation.  - Pulmonic valve: There was mild regurgitation.  - Pulmonary arteries: Systolic pressure was moderately increased.  PA peak pressure: 54 mm Hg (S).  - Pericardium, extracardiac: There was no pericardial effusion.    Nuclear stress test 08/18/15 [facility]): Conclusion: Abnormal study. Small stress induced anterior septal defect. Normal LV function. Global left ventricular systolic function, with EF of 64%. - She was seeing cardiologist Almira Coaster, MD with [facility] at that time. On 09/05/15 he noted, "Small defect, normal LV function, CKD, functional status limited by other than cardiac. Medical therapy for now. Coronary angio for low risk test may accelerate dialysis and its inherent risk and problems." [facility] records also reviewed by Dr. Domenic Polite when he took over her cardiac care starting 06/07/16.    ETT 08/11/15 ([facility]):  Incomplete. Patient lying over handrail prior to 1 minute mark, c/o leg fatigue. No chest pain. Lexiscan ordered.    Past Medical History:  Diagnosis Date  . Anemia of chronic disease   . Anxiety   . Asthma   . Blind left eye   . Bronchitis   . Cataract   . Cholecystitis, acute 05/26/2013   Status post cholecystectomy  . Chronic abdominal pain   . Chronic diarrhea   . COPD (chronic obstructive pulmonary disease) ([place])   . Depression   . Diabetic foot ulcer ([place]) 03/01/2015  . Diastolic heart failure ([place])   . ESRD on hemodialysis ([place])    Started diaylsis 12/29/15  . Essential hypertension   . Fibroids   . Glaucoma   . History of blood transfusion   . History of pneumonia   . Hyperlipidemia   . Insulin-dependent diabetes mellitus with retinopathy   . Liver fibrosis    Negative Hep B surface antigen, negative Hep C antibody  Feb 2018 (see scanned in labs).  . Neuropathy   . Osteomyelitis ([place])    Toe on left foot    Past Surgical History:  Procedure Laterality Date  . A/V SHUNTOGRAM N/A 10/25/2016   Procedure: A/V Shuntogram - Right Arm;  Surgeon: Waynetta Sandy, MD;  Location: [place] [facility];  Service: Cardiovascular;  Laterality: N/A;  . A/V SHUNTOGRAM N/A 03/05/2018   Procedure: A/V SHUNTOGRAM - Right Arm;  Surgeon: Waynetta Sandy, MD;  Location: [place] [facility];  Service: Cardiovascular;  Laterality: N/A;  . AV FISTULA PLACEMENT Right 10/17/2015   Procedure: INSERTION OF ARTERIOVENOUS GORE-TEX GRAFT RIGHT UPPER ARM WITH ACUSEAL;  Surgeon: Conrad Lockwood, MD;  Location: [place];  Service: Vascular;  Laterality: Right;  . CATARACT EXTRACTION W/ INTRAOCULAR LENS IMPLANT Bilateral   . CESAREAN SECTION    . CHOLECYSTECTOMY N/A 05/25/2013   Procedure: LAPAROSCOPIC CHOLECYSTECTOMY;  Surgeon: Jamesetta So, MD;  Location: [facility];  Service: General;  Laterality: N/A;  . COLONOSCOPY WITH PROPOFOL N/A 10/02/2016   two 3 to 5 mm  polyps in the descending colon, three 2 to 3 mm polyps in the rectum, random colon biopsies, rectal bleeding due to internal hemorrhoids, friability with no bleeding at the anus status post biopsy.  Surgical pathology found the polyps to be a mix of hyperplastic and tubular adenoma, random colon biopsies to be benign colonic mucosa, and the anal biopsies to be anal skin tag.   Marland Kitchen ESOPHAGOGASTRODUODENOSCOPY (EGD) WITH PROPOFOL N/A 10/02/2016   mucosal nodule in the esophagus status post biopsy, moderate gastritis status post biopsy, mild duodenitis. esophageal biopsy to be benign, gastric biopsies to be gastritis due to aspirin use, and duodenal biopsies to be duodenitis due to aspirin use  . EYE SURGERY Bilateral   . IR FLUORO GUIDE CV LINE RIGHT  10/09/2019  . IR THROMBECTOMY AV FISTULA W/THROMBOLYSIS/PTA INC/SHUNT/IMG RIGHT Right 12/26/2018  . IR THROMBECTOMY AV FISTULA  W/THROMBOLYSIS/PTA INC/SHUNT/IMG RIGHT Right 08/10/2019  . IR THROMBECTOMY AV FISTULA W/THROMBOLYSIS/PTA/STENT INC/SHUNT/IMG RT Right 08/08/2018  . IR THROMBECTOMY AV FISTULA W/THROMBOLYSIS/PTA/STENT INC/SHUNT/IMG RT Right 05/15/2019  . IR US GUIDE VASC ACCESS RIGHT  08/08/2018  . IR US GUIDE VASC ACCESS RIGHT  12/26/2018  . IR US GUIDE VASC ACCESS RIGHT  05/15/2019  . IR US GUIDE VASC ACCESS RIGHT  08/10/2019  . IR US GUIDE VASC ACCESS RIGHT  10/09/2019  . PARS PLANA VITRECTOMY Left 11/24/2014   Procedure: PARS PLANA VITRECTOMY WITH 25 GAUGE;  Surgeon: Hurman Horn, MD;  Location: [place];  Service: Ophthalmology;  Laterality: Left;  . PERIPHERAL VASCULAR BALLOON ANGIOPLASTY Right 03/05/2018   Procedure: PERIPHERAL VASCULAR BALLOON ANGIOPLASTY;  Surgeon: Waynetta Sandy, MD;  Location: [place] [facility];  Service: Cardiovascular;  Laterality: Right;  arm fistula  . PERIPHERAL VASCULAR CATHETERIZATION N/A 04/28/2015   Procedure: Bilateral Upper Extremity Venography;  Surgeon: Conrad Ellsworth, MD;  Location: [place] [facility];  Service: Cardiovascular;  Laterality: N/A;  . PHOTOCOAGULATION WITH LASER Left 11/24/2014   Procedure: PHOTOCOAGULATION WITH LASER;  Surgeon: Hurman Horn, MD;  Location: [place];  Service: Ophthalmology;  Laterality: Left;  with insertion of silicone oil  . SAVORY DILATION N/A 10/02/2016   Procedure: SAVORY DILATION;  Surgeon: Danie Binder, MD;  [facility];  Service: Endoscopy;  Laterality: N/A;  . TUBAL LIGATION    . UPPER EXTREMITY VENOGRAPHY Bilateral 11/02/2019   Procedure: UPPER EXTREMITY VENOGRAPHY;  Surgeon: Waynetta Sandy, MD;  Location: [place] [facility];  Service: Cardiovascular;  Laterality: Bilateral;    MEDICATIONS: No current facility-administered medications for this encounter.   Marland Kitchen acetaminophen (TYLENOL) 500 MG tablet  . apixaban (ELIQUIS) 2.5 MG TABS tablet  . ARIPiprazole (ABILIFY) 5 MG tablet  . azelastine (ASTELIN) 0.1 %  nasal spray  . carvedilol (COREG) 12.5 MG tablet  . cholecalciferol (VITAMIN D) 1000 units tablet  . cyclobenzaprine (FLEXERIL) 5 MG tablet  . diclofenac sodium (VOLTAREN) 1 % GEL  . gabapentin (NEURONTIN) 100 MG capsule  . ibuprofen (ADVIL) 200 MG tablet  . LANTUS SOLOSTAR 100 UNIT/ML Solostar Pen  . lidocaine-prilocaine (EMLA) cream  . linagliptin (TRADJENTA) 5 MG TABS tablet  . multivitamin (RENA-VIT) TABS tablet  . pantoprazole (PROTONIX) 40 MG tablet  . Probiotic Product (CULTURELLE PROBIOTICS PO)  . sertraline (ZOLOFT) 100 MG tablet  . traZODone (DESYREL) 50 MG tablet  . Continuous Blood Gluc Sensor (FREESTYLE LIBRE 14 DAY SENSOR) MISC  . glucose blood (PRODIGY NO CODING BLOOD GLUC) test strip  . Insulin Pen Needle (TRUEPLUS PEN NEEDLES) 31G  X 5 MM MISC  . PRODIGY TWIST TOP LANCETS 28G MISC  . sevelamer carbonate (RENVELA) 800 MG tablet  Meds listed as currently not taking: Eliquis    Myra Gianotti, PA-C Surgical Short Stay/Anesthesiology [facility] Phone [phone] [facility] Phone [phone] 11/17/2019 1:37 PM

## 2019-11-18 ENCOUNTER — Encounter (HOSPITAL_COMMUNITY)
Admission: RE | Disposition: A | Discharge status: Home / Self Care | Payer: Self-pay | Source: Home / Self Care | Attending: Vascular Surgery

## 2019-11-18 ENCOUNTER — Encounter (HOSPITAL_COMMUNITY): Payer: Self-pay | Admitting: Vascular Surgery

## 2019-11-18 ENCOUNTER — Other Ambulatory Visit: Payer: Self-pay

## 2019-11-18 ENCOUNTER — Ambulatory Visit (HOSPITAL_COMMUNITY): Payer: Medicare Other | Admitting: Vascular Surgery

## 2019-11-18 ENCOUNTER — Ambulatory Visit (HOSPITAL_COMMUNITY)
Admission: RE | Admit: 2019-11-18 | Discharge: 2019-11-18 | Disposition: A | Discharge status: Home / Self Care | Payer: Medicare Other | Attending: Vascular Surgery | Admitting: Vascular Surgery

## 2019-11-18 DIAGNOSIS — Z794 Long term (current) use of insulin: Secondary | ICD-10-CM | POA: Insufficient documentation

## 2019-11-18 DIAGNOSIS — E785 Hyperlipidemia, unspecified: Secondary | ICD-10-CM | POA: Insufficient documentation

## 2019-11-18 DIAGNOSIS — N186 End stage renal disease: Secondary | ICD-10-CM | POA: Diagnosis not present

## 2019-11-18 DIAGNOSIS — F329 Major depressive disorder, single episode, unspecified: Secondary | ICD-10-CM | POA: Diagnosis not present

## 2019-11-18 DIAGNOSIS — Z79899 Other long term (current) drug therapy: Secondary | ICD-10-CM | POA: Diagnosis not present

## 2019-11-18 DIAGNOSIS — H5462 Unqualified visual loss, left eye, normal vision right eye: Secondary | ICD-10-CM | POA: Diagnosis not present

## 2019-11-18 DIAGNOSIS — Z992 Dependence on renal dialysis: Secondary | ICD-10-CM | POA: Insufficient documentation

## 2019-11-18 DIAGNOSIS — E1122 Type 2 diabetes mellitus with diabetic chronic kidney disease: Secondary | ICD-10-CM | POA: Insufficient documentation

## 2019-11-18 DIAGNOSIS — F419 Anxiety disorder, unspecified: Secondary | ICD-10-CM | POA: Insufficient documentation

## 2019-11-18 DIAGNOSIS — Z7901 Long term (current) use of anticoagulants: Secondary | ICD-10-CM | POA: Insufficient documentation

## 2019-11-18 DIAGNOSIS — E1136 Type 2 diabetes mellitus with diabetic cataract: Secondary | ICD-10-CM | POA: Diagnosis not present

## 2019-11-18 DIAGNOSIS — I132 Hypertensive heart and chronic kidney disease with heart failure and with stage 5 chronic kidney disease, or end stage renal disease: Secondary | ICD-10-CM | POA: Insufficient documentation

## 2019-11-18 DIAGNOSIS — I5032 Chronic diastolic (congestive) heart failure: Secondary | ICD-10-CM | POA: Insufficient documentation

## 2019-11-18 DIAGNOSIS — N185 Chronic kidney disease, stage 5: Secondary | ICD-10-CM | POA: Diagnosis not present

## 2019-11-18 DIAGNOSIS — J449 Chronic obstructive pulmonary disease, unspecified: Secondary | ICD-10-CM | POA: Insufficient documentation

## 2019-11-18 DIAGNOSIS — E1140 Type 2 diabetes mellitus with diabetic neuropathy, unspecified: Secondary | ICD-10-CM | POA: Insufficient documentation

## 2019-11-18 DIAGNOSIS — Z95828 Presence of other vascular implants and grafts: Secondary | ICD-10-CM | POA: Diagnosis not present

## 2019-11-18 DIAGNOSIS — E11319 Type 2 diabetes mellitus with unspecified diabetic retinopathy without macular edema: Secondary | ICD-10-CM | POA: Diagnosis not present

## 2019-11-18 HISTORY — DX: Chronic obstructive pulmonary disease, unspecified: J44.9

## 2019-11-18 HISTORY — DX: Anxiety disorder, unspecified: F41.9

## 2019-11-18 HISTORY — PX: AV FISTULA PLACEMENT: SHX1204

## 2019-11-18 LAB — POCT I-STAT, CHEM 8
BUN: 33 mg/dL — ABNORMAL HIGH (ref 6–20)
Calcium, Ion: 1.11 mmol/L — ABNORMAL LOW (ref 1.15–1.40)
Chloride: 100 mmol/L (ref 98–111)
Creatinine, Ser: 6 mg/dL — ABNORMAL HIGH (ref 0.44–1.00)
Glucose, Bld: 131 mg/dL — ABNORMAL HIGH (ref 70–99)
HCT: 31 % — ABNORMAL LOW (ref 36.0–46.0)
Hemoglobin: 10.5 g/dL — ABNORMAL LOW (ref 12.0–15.0)
Potassium: 4.6 mmol/L (ref 3.5–5.1)
Sodium: 137 mmol/L (ref 135–145)
TCO2: 28 mmol/L (ref 22–32)

## 2019-11-18 LAB — GLUCOSE, CAPILLARY
Glucose-Capillary: 122 mg/dL — ABNORMAL HIGH (ref 70–99)
Glucose-Capillary: 128 mg/dL — ABNORMAL HIGH (ref 70–99)

## 2019-11-18 SURGERY — INSERTION OF ARTERIOVENOUS (AV) GORE-TEX GRAFT ARM
Anesthesia: General | Site: Arm Upper | Laterality: Left

## 2019-11-18 MED ORDER — CEFAZOLIN SODIUM-DEXTROSE 2-4 GM/100ML-% IV SOLN
INTRAVENOUS | Status: AC
  Filled 2019-11-18: qty 100

## 2019-11-18 MED ORDER — ONDANSETRON HCL 4 MG/2ML IJ SOLN
INTRAMUSCULAR | Status: DC | PRN
  Administered 2019-11-18: 4 mg via INTRAVENOUS

## 2019-11-18 MED ORDER — PROPOFOL 10 MG/ML IV BOLUS
INTRAVENOUS | Status: AC
  Filled 2019-11-18: qty 20

## 2019-11-18 MED ORDER — FENTANYL CITRATE (PF) 100 MCG/2ML IJ SOLN
INTRAMUSCULAR | Status: AC
  Filled 2019-11-18: qty 2

## 2019-11-18 MED ORDER — ONDANSETRON HCL 4 MG/2ML IJ SOLN
4.0000 mg | Freq: Four times a day (QID) | INTRAMUSCULAR | Status: AC | PRN
  Administered 2019-11-18: 4 mg via INTRAVENOUS

## 2019-11-18 MED ORDER — SODIUM CHLORIDE 0.9 % IV SOLN
INTRAVENOUS | Status: DC | PRN
  Administered 2019-11-18: 500 mL

## 2019-11-18 MED ORDER — LIDOCAINE HCL (CARDIAC) PF 100 MG/5ML IV SOSY: PREFILLED_SYRINGE | INTRAVENOUS | Status: DC | PRN

## 2019-11-18 MED ORDER — MIDAZOLAM HCL 2 MG/2ML IJ SOLN
INTRAMUSCULAR | Status: AC
  Filled 2019-11-18: qty 2

## 2019-11-18 MED ORDER — OXYCODONE-ACETAMINOPHEN 7.5-325 MG PO TABS: 1.0000 | ORAL_TABLET | ORAL | 0 refills | Status: DC | PRN

## 2019-11-18 MED ORDER — LIDOCAINE 2% (20 MG/ML) 5 ML SYRINGE
INTRAMUSCULAR | Status: DC | PRN
  Administered 2019-11-18: 40 mg via INTRAVENOUS

## 2019-11-18 MED ORDER — OXYCODONE HCL 5 MG/5ML PO SOLN: 5.0000 mg | Freq: Once | ORAL | Status: AC | PRN

## 2019-11-18 MED ORDER — MIDAZOLAM HCL 5 MG/5ML IJ SOLN
INTRAMUSCULAR | Status: DC | PRN
  Administered 2019-11-18: 2 mg via INTRAVENOUS

## 2019-11-18 MED ORDER — PROPOFOL 10 MG/ML IV BOLUS
INTRAVENOUS | Status: DC | PRN
  Administered 2019-11-18: 100 mg via INTRAVENOUS
  Administered 2019-11-18: 50 mg via INTRAVENOUS

## 2019-11-18 MED ORDER — OXYCODONE HCL 5 MG PO TABS
ORAL_TABLET | ORAL | Status: AC
  Filled 2019-11-18: qty 1

## 2019-11-18 MED ORDER — SODIUM CHLORIDE 0.9 % IV SOLN: INTRAVENOUS | Status: DC

## 2019-11-18 MED ORDER — FENTANYL CITRATE (PF) 100 MCG/2ML IJ SOLN
25.0000 ug | INTRAMUSCULAR | Status: DC | PRN
  Administered 2019-11-18: 12:00:00 25 ug via INTRAVENOUS

## 2019-11-18 MED ORDER — ONDANSETRON HCL 4 MG/2ML IJ SOLN
INTRAMUSCULAR | Status: AC
  Filled 2019-11-18: qty 2

## 2019-11-18 MED ORDER — PROTAMINE SULFATE 10 MG/ML IV SOLN
INTRAVENOUS | Status: DC | PRN
  Administered 2019-11-18: 25 mg via INTRAVENOUS

## 2019-11-18 MED ORDER — PHENYLEPHRINE 40 MCG/ML (10ML) SYRINGE FOR IV PUSH (FOR BLOOD PRESSURE SUPPORT)
PREFILLED_SYRINGE | INTRAVENOUS | Status: DC | PRN
  Administered 2019-11-18: 80 ug via INTRAVENOUS

## 2019-11-18 MED ORDER — CEFAZOLIN SODIUM-DEXTROSE 2-4 GM/100ML-% IV SOLN
2.0000 g | INTRAVENOUS | Status: AC
  Administered 2019-11-18: 2 g via INTRAVENOUS

## 2019-11-18 MED ORDER — SODIUM CHLORIDE 0.9 % IV SOLN
INTRAVENOUS | Status: AC
  Filled 2019-11-18: qty 1.2

## 2019-11-18 MED ORDER — OXYCODONE HCL 5 MG PO TABS
5.0000 mg | ORAL_TABLET | Freq: Once | ORAL | Status: AC | PRN
  Administered 2019-11-18: 5 mg via ORAL

## 2019-11-18 MED ORDER — HEMOSTATIC AGENTS (NO CHARGE) OPTIME
TOPICAL | Status: DC | PRN
  Administered 2019-11-18: 1 via TOPICAL

## 2019-11-18 MED ORDER — CHLORHEXIDINE GLUCONATE 4 % EX LIQD: 60.0000 mL | Freq: Once | CUTANEOUS | Status: DC

## 2019-11-18 MED ORDER — LIDOCAINE-EPINEPHRINE 1 %-1:100000 IJ SOLN
INTRAMUSCULAR | Status: AC
  Filled 2019-11-18: qty 1

## 2019-11-18 MED ORDER — FENTANYL CITRATE (PF) 250 MCG/5ML IJ SOLN
INTRAMUSCULAR | Status: AC
  Filled 2019-11-18: qty 5

## 2019-11-18 MED ORDER — PHENYLEPHRINE HCL-NACL 10-0.9 MG/250ML-% IV SOLN
INTRAVENOUS | Status: DC | PRN
  Administered 2019-11-18: 25 ug/min via INTRAVENOUS

## 2019-11-18 MED ORDER — 0.9 % SODIUM CHLORIDE (POUR BTL) OPTIME
TOPICAL | Status: DC | PRN
  Administered 2019-11-18: 1000 mL

## 2019-11-18 MED ORDER — FENTANYL CITRATE (PF) 100 MCG/2ML IJ SOLN
INTRAMUSCULAR | Status: DC | PRN
  Administered 2019-11-18 (×5): 25 ug via INTRAVENOUS

## 2019-11-18 SURGICAL SUPPLY — 32 items
ARMBAND PINK RESTRICT EXTREMIT (MISCELLANEOUS) ×2 IMPLANT
CANISTER SUCT 3000ML PPV (MISCELLANEOUS) ×2 IMPLANT
CANNULA VESSEL 3MM 2 BLNT TIP (CANNULA) IMPLANT
CLIP VESOCCLUDE MED 6/CT (CLIP) ×2 IMPLANT
CLIP VESOCCLUDE SM WIDE 6/CT (CLIP) ×2 IMPLANT
COVER PROBE W GEL 5X96 (DRAPES) ×2 IMPLANT
COVER WAND RF STERILE (DRAPES) IMPLANT
DERMABOND ADVANCED (GAUZE/BANDAGES/DRESSINGS) ×1
DERMABOND ADVANCED .7 DNX12 (GAUZE/BANDAGES/DRESSINGS) ×1 IMPLANT
ELECT REM PT RETURN 9FT ADLT (ELECTROSURGICAL) ×2
ELECTRODE REM PT RTRN 9FT ADLT (ELECTROSURGICAL) ×1 IMPLANT
GAUZE SPONGE 4X4 12PLY STRL (GAUZE/BANDAGES/DRESSINGS) ×2 IMPLANT
GLOVE SS BIOGEL STRL SZ 7.5 (GLOVE) ×1 IMPLANT
GLOVE SUPERSENSE BIOGEL SZ 7.5 (GLOVE) ×1
GOWN STRL REUS W/ TWL LRG LVL3 (GOWN DISPOSABLE) ×3 IMPLANT
GOWN STRL REUS W/TWL LRG LVL3 (GOWN DISPOSABLE) ×6
GRAFT GORETEX STRT 4-7X45 (Vascular Products) ×2 IMPLANT
HEMOSTAT SNOW SURGICEL 2X4 (HEMOSTASIS) ×2 IMPLANT
KIT BASIN OR (CUSTOM PROCEDURE TRAY) ×2 IMPLANT
KIT TURNOVER KIT B (KITS) ×2 IMPLANT
NS IRRIG 1000ML POUR BTL (IV SOLUTION) ×2 IMPLANT
PACK CV ACCESS (CUSTOM PROCEDURE TRAY) ×2 IMPLANT
PAD ARMBOARD 7.5X6 YLW CONV (MISCELLANEOUS) ×4 IMPLANT
SPONGE LAP 18X18 RF (DISPOSABLE) ×2 IMPLANT
SUT PROLENE 6 0 CC (SUTURE) ×12 IMPLANT
SUT SILK 2 0 PERMA HAND 18 BK (SUTURE) IMPLANT
SUT VIC AB 3-0 SH 27 (SUTURE) ×4
SUT VIC AB 3-0 SH 27X BRD (SUTURE) ×2 IMPLANT
SYR TOOMEY 50ML (SYRINGE) IMPLANT
TOWEL GREEN STERILE (TOWEL DISPOSABLE) ×2 IMPLANT
UNDERPAD 30X30 (UNDERPADS AND DIAPERS) ×2 IMPLANT
WATER STERILE IRR 1000ML POUR (IV SOLUTION) ×2 IMPLANT

## 2019-11-18 NOTE — H&P (Signed)
   Patient name: Kara Knapp MRN: [id number] DOB: 29-May-1971 Sex: female   HISTORY OF PRESENT ILLNESS:   Kara Knapp is a 49 y.o. female with ESRD in need of new access  CURRENT MEDICATIONS:    Current Facility-Administered Medications  Medication Dose Route Frequency Provider Last Rate Last Admin  . 0.9 %  sodium chloride infusion   Intravenous Continuous Waynetta Sandy, MD      . ceFAZolin (ANCEF) 2-4 GM/100ML-% IVPB           . ceFAZolin (ANCEF) IVPB 2g/100 mL premix  2 g Intravenous 30 min Pre-Op Waynetta Sandy, MD      . chlorhexidine (HIBICLENS) 4 % liquid 4 application  60 mL Topical Once Waynetta Sandy, MD       And  . Derrill Memo ON 11/19/2019] chlorhexidine (HIBICLENS) 4 % liquid 4 application  60 mL Topical Once Waynetta Sandy, MD      . heparin 6,000 Units in sodium chloride 0.9 % 500 mL irrigation    PRN Serafina Mitchell, MD   500 mL at 11/18/19 0911    REVIEW OF SYSTEMS:   [X]  denotes positive finding, [ ]  denotes negative finding Cardiac  Comments:  Chest pain or chest pressure:    Shortness of breath upon exertion:    Short of breath when lying flat:    Irregular heart rhythm:    Constitutional    Fever or chills:      PHYSICAL EXAM:   Vitals:   11/18/19 0902  BP: (!) 151/63  Pulse: 72  Resp: 16  Temp: 98.3 F (36.8 C)  TempSrc: Oral  Weight: 83.9 kg  Height: 5' 3"  (1.6 m)    GENERAL: The patient is a well-nourished female, in no acute distress. The vital signs are documented above. CARDIOVASCULAR: There is a regular rate and rhythm. PULMONARY: Non-labored respirations Palpable left brachial pulse  STUDIES:   Venogram shows patent left venous system   MEDICAL ISSUES:   Plan for left upper arm AVGG.  Risks and benefits discussed.  All questions answered   Leia Alf, MD, FACS [facility] [phone] Pager [phone]

## 2019-11-18 NOTE — Transfer of Care (Signed)
Immediate Anesthesia Transfer of Care Note  Patient: Kara Knapp  Procedure(s) Performed: INSERTION OF ARTERIOVENOUS (AV) GORE-TEX GRAFT left  ARM (Left Arm Upper)  Patient Location: PACU  Anesthesia Type:General  Level of Consciousness: awake and alert   Airway & Oxygen Therapy: Patient Spontanous Breathing and Patient connected to nasal cannula oxygen  Post-op Assessment: Report given to RN and Post -op Vital signs reviewed and stable  Post vital signs: Reviewed and stable  Last Vitals:  Vitals Value Taken Time  BP    Temp    Pulse 71 11/18/19 1123  Resp 20 11/18/19 1123  SpO2 93 % 11/18/19 1123  Vitals shown include unvalidated device data.  Last Pain:  Vitals:   11/18/19 0902  TempSrc: Oral  PainSc: 0-No pain         Complications: No apparent anesthesia complications

## 2019-11-18 NOTE — Discharge Instructions (Signed)
° °  [facility] [place] ° °Discharge Instructions ° °AV Fistula or Graft Surgery for Dialysis Access ° °Please refer to the following instructions for your post-procedure care. Your surgeon or physician assistant will discuss any changes with you. ° °Activity ° °You may drive the day following your surgery, if you are comfortable and no longer taking prescription pain medication. Resume full activity as the soreness in your incision resolves. ° °Bathing/Showering ° °You may shower after you go home. Keep your incision dry for 48 hours. Do not soak in a bathtub, hot tub, or swim until the incision heals completely. You may not shower if you have a hemodialysis catheter. ° °Incision Care ° °Clean your incision with mild soap and water after 48 hours. Pat the area dry with a clean towel. You do not need a bandage unless otherwise instructed. Do not apply any ointments or creams to your incision. You may have skin glue on your incision. Do not peel it off. It will come off on its own in about one week. Your arm may swell a bit after surgery. To reduce swelling use pillows to elevate your arm so it is above your heart. Your doctor will tell you if you need to lightly wrap your arm with an ACE bandage. ° °Diet ° °Resume your normal diet. There are not special food restrictions following this procedure. In order to heal from your surgery, it is CRITICAL to get adequate nutrition. Your body requires vitamins, minerals, and protein. Vegetables are the best source of vitamins and minerals. Vegetables also provide the perfect balance of protein. Processed food has little nutritional value, so try to avoid this. ° °Medications ° °Resume taking all of your medications. If your incision is causing pain, you may take over-the counter pain relievers such as acetaminophen (Tylenol). If you were prescribed a stronger pain medication, please be aware these medications can cause nausea and constipation. Prevent  nausea by taking the medication with a snack or meal. Avoid constipation by drinking plenty of fluids and eating foods with high amount of fiber, such as fruits, vegetables, and grains. Do not take Tylenol if you are taking prescription pain medications. ° ° ° ° °Follow up °Your surgeon may want to see you in the office following your access surgery. If so, this will be arranged at the time of your surgery. ° °Please call us immediately for any of the following conditions: ° °Increased pain, redness, drainage (pus) from your incision site °Fever of 101 degrees or higher °Severe or worsening pain at your incision site °Hand pain or numbness. ° °Reduce your risk of vascular disease: ° °Stop smoking. If you would like help, call [facility] at 1-800-QUIT-NOW (1-800-784-8669) or [facility] at 336-586-4000 ° °Manage your cholesterol °Maintain a desired weight °Control your diabetes °Keep your blood pressure down ° °Dialysis ° °It will take several weeks to several months for your new dialysis access to be ready for use. Your surgeon will determine when it is OK to use it. Your nephrologist will continue to direct your dialysis. You can continue to use your Permcath until your new access is ready for use. ° °If you have any questions, please call the office at [phone]. ° °

## 2019-11-18 NOTE — Anesthesia Procedure Notes (Signed)
Procedure Name: LMA Insertion Date/Time: 11/18/2019 9:44 AM Performed by: Wilburn Cornelia, CRNA Pre-anesthesia Checklist: Patient identified, Emergency Drugs available, Suction available, Patient being monitored and Timeout performed Patient Re-evaluated:Patient Re-evaluated prior to induction Oxygen Delivery Method: Circle system utilized Preoxygenation: Pre-oxygenation with 100% oxygen Induction Type: IV induction Ventilation: Mask ventilation without difficulty LMA: LMA inserted LMA Size: 4.0 Number of attempts: 1 Placement Confirmation: positive ETCO2 and breath sounds checked- equal and bilateral Tube secured with: Tape Dental Injury: Teeth and Oropharynx as per pre-operative assessment

## 2019-11-19 NOTE — Op Note (Signed)
    Patient name: Kara Knapp MRN: [id number] DOB: [birth year] Sex: female  11/18/2019 Pre-operative Diagnosis: ESRD Post-operative diagnosis:  Same Surgeon:  Annamarie Major Assistants:  Ellsworth Lennox Procedure:   Left upper arm dialysis graft (4 x 7) Anesthesia: General Blood Loss: Minimal Specimens: None  Findings: Artery was small measuring about 2.5 mm.  Venous anastomosis was end-to-side  Indications: Patient comes in today for left arm graft.  She has recently had a venogram that showed no central stenosis on the left.  Procedure:  The patient was identified in the holding area and taken to [place] [address]  The patient was then placed supine on the table. general anesthesia was administered.  The patient was prepped and draped in the usual sterile fashion.  A time out was called and antibiotics were administered.  Longitudinal incision was made just proximal to the antecubital crease.  Through this incision I exposed the brachial artery which was a 2.5 mm disease-free artery.  A second longitudinal incision was made up near the axilla.  Through this incision I exposed the brachial vein.  This was a healthy appearing 3 to 4 mm vein.  Next a curved tunneler was used to create a tunnel between the 2 incisions and a 4 x 7 Gore-Tex graft was brought through the tunnel.  3000 units of heparin was administered.  The brachial artery was then occluded and a small arteriotomy was made with a #11 blade which was extended longitudinally with Potts scissors.  I used the 4 mm portion of the graft for the arterial anastomosis was done in an end-to-side fashion with 6-0 Prolene.  After completion there was excellent flow through the graft which was flushed with heparin saline and reoccluded.  Attention was then turned towards the vein which was opened with a 11 blade and extended with Potts scissors.  A end-to-side anastomosis was created with 6-0 Prolene.  Prior to completion the appropriate flushing maneuvers  were performed and the anastomosis was completed.  There was an excellent thrill within the graft.  Patient had a brisk Doppler signal in the radial artery.  Hemostasis was then achieved.  The incisions were closed with 2 layers of 3-0 Vicryl followed by Dermabond.   Disposition: To PACU stable.   Theotis Burrow, M.D., [facility] [employer] Office: [phone] Pager:  [phone]

## 2019-11-19 NOTE — Anesthesia Postprocedure Evaluation (Signed)
Anesthesia Post Note  Patient: Kara Knapp  Procedure(s) Performed: INSERTION OF ARTERIOVENOUS (AV) GORE-TEX GRAFT left  ARM (Left Arm Upper)     Patient location during evaluation: PACU Anesthesia Type: General Level of consciousness: awake and alert Pain management: pain level controlled Vital Signs Assessment: post-procedure vital signs reviewed and stable Respiratory status: spontaneous breathing, nonlabored ventilation, respiratory function stable and patient connected to nasal cannula oxygen Cardiovascular status: blood pressure returned to baseline and stable Postop Assessment: no apparent nausea or vomiting Anesthetic complications: no    Last Vitals:  Vitals:   11/18/19 1157 11/18/19 1208  BP: (!) 176/81 (!) 176/86  Pulse: 73 69  Resp: 16 16  Temp:  (!) 36.3 C  SpO2: 95% 95%    Last Pain:  Vitals:   11/18/19 1157  TempSrc:   PainSc: [place]

## 2019-11-20 DIAGNOSIS — N186 End stage renal disease: Secondary | ICD-10-CM | POA: Diagnosis not present

## 2019-11-20 DIAGNOSIS — D631 Anemia in chronic kidney disease: Secondary | ICD-10-CM | POA: Diagnosis not present

## 2019-11-20 DIAGNOSIS — N250 Renal osteodystrophy: Secondary | ICD-10-CM | POA: Diagnosis not present

## 2019-11-20 DIAGNOSIS — Z992 Dependence on renal dialysis: Secondary | ICD-10-CM | POA: Diagnosis not present

## 2019-11-21 DIAGNOSIS — Z992 Dependence on renal dialysis: Secondary | ICD-10-CM | POA: Diagnosis not present

## 2019-11-21 DIAGNOSIS — N186 End stage renal disease: Secondary | ICD-10-CM | POA: Diagnosis not present

## 2019-11-21 DIAGNOSIS — N250 Renal osteodystrophy: Secondary | ICD-10-CM | POA: Diagnosis not present

## 2019-11-21 DIAGNOSIS — D631 Anemia in chronic kidney disease: Secondary | ICD-10-CM | POA: Diagnosis not present

## 2019-11-23 ENCOUNTER — Encounter (HOSPITAL_COMMUNITY): Payer: Self-pay | Admitting: Psychiatry

## 2019-11-23 ENCOUNTER — Other Ambulatory Visit: Payer: Self-pay

## 2019-11-23 ENCOUNTER — Telehealth (INDEPENDENT_AMBULATORY_CARE_PROVIDER_SITE_OTHER): Payer: Medicare Other | Admitting: Psychiatry

## 2019-11-23 DIAGNOSIS — F331 Major depressive disorder, recurrent, moderate: Secondary | ICD-10-CM

## 2019-11-23 MED ORDER — ARIPIPRAZOLE 5 MG PO TABS: 5.0000 mg | ORAL_TABLET | Freq: Every day | ORAL | 0 refills | Status: DC

## 2019-11-23 NOTE — Patient Instructions (Signed)
1.Continuesertraline 150 mg daily 2. Continue Abilify 5 mg at night  3. Next appointment: 5/26 at 11:20

## 2019-11-24 DIAGNOSIS — D631 Anemia in chronic kidney disease: Secondary | ICD-10-CM | POA: Diagnosis not present

## 2019-11-24 DIAGNOSIS — N186 End stage renal disease: Secondary | ICD-10-CM | POA: Diagnosis not present

## 2019-11-24 DIAGNOSIS — Z992 Dependence on renal dialysis: Secondary | ICD-10-CM | POA: Diagnosis not present

## 2019-11-24 DIAGNOSIS — N250 Renal osteodystrophy: Secondary | ICD-10-CM | POA: Diagnosis not present

## 2019-11-25 ENCOUNTER — Other Ambulatory Visit: Payer: Self-pay | Admitting: Family Medicine

## 2019-11-26 ENCOUNTER — Telehealth (HOSPITAL_COMMUNITY): Payer: Medicare Other | Admitting: Clinical

## 2019-11-26 DIAGNOSIS — D631 Anemia in chronic kidney disease: Secondary | ICD-10-CM | POA: Diagnosis not present

## 2019-11-26 DIAGNOSIS — N250 Renal osteodystrophy: Secondary | ICD-10-CM | POA: Diagnosis not present

## 2019-11-26 DIAGNOSIS — N186 End stage renal disease: Secondary | ICD-10-CM | POA: Diagnosis not present

## 2019-11-26 DIAGNOSIS — Z992 Dependence on renal dialysis: Secondary | ICD-10-CM | POA: Diagnosis not present

## 2019-11-27 DIAGNOSIS — E1165 Type 2 diabetes mellitus with hyperglycemia: Secondary | ICD-10-CM | POA: Diagnosis not present

## 2019-11-27 DIAGNOSIS — N189 Chronic kidney disease, unspecified: Secondary | ICD-10-CM | POA: Diagnosis not present

## 2019-11-27 DIAGNOSIS — Z0000 Encounter for general adult medical examination without abnormal findings: Secondary | ICD-10-CM | POA: Diagnosis not present

## 2019-11-27 DIAGNOSIS — Z992 Dependence on renal dialysis: Secondary | ICD-10-CM | POA: Diagnosis not present

## 2019-11-27 DIAGNOSIS — I10 Essential (primary) hypertension: Secondary | ICD-10-CM | POA: Diagnosis not present

## 2019-11-27 DIAGNOSIS — N186 End stage renal disease: Secondary | ICD-10-CM | POA: Diagnosis not present

## 2019-11-27 DIAGNOSIS — E1121 Type 2 diabetes mellitus with diabetic nephropathy: Secondary | ICD-10-CM | POA: Diagnosis not present

## 2019-11-27 DIAGNOSIS — Z299 Encounter for prophylactic measures, unspecified: Secondary | ICD-10-CM | POA: Diagnosis not present

## 2019-11-27 DIAGNOSIS — F17210 Nicotine dependence, cigarettes, uncomplicated: Secondary | ICD-10-CM | POA: Diagnosis not present

## 2019-11-28 DIAGNOSIS — T82898A Other specified complication of vascular prosthetic devices, implants and grafts, initial encounter: Secondary | ICD-10-CM | POA: Diagnosis not present

## 2019-11-28 DIAGNOSIS — D631 Anemia in chronic kidney disease: Secondary | ICD-10-CM | POA: Diagnosis not present

## 2019-11-28 DIAGNOSIS — B9562 Methicillin resistant Staphylococcus aureus infection as the cause of diseases classified elsewhere: Secondary | ICD-10-CM | POA: Diagnosis not present

## 2019-11-28 DIAGNOSIS — I959 Hypotension, unspecified: Secondary | ICD-10-CM | POA: Diagnosis not present

## 2019-11-28 DIAGNOSIS — T827XXA Infection and inflammatory reaction due to other cardiac and vascular devices, implants and grafts, initial encounter: Secondary | ICD-10-CM | POA: Diagnosis not present

## 2019-11-28 DIAGNOSIS — Z992 Dependence on renal dialysis: Secondary | ICD-10-CM | POA: Diagnosis not present

## 2019-11-28 DIAGNOSIS — N186 End stage renal disease: Secondary | ICD-10-CM | POA: Diagnosis not present

## 2019-11-28 DIAGNOSIS — N250 Renal osteodystrophy: Secondary | ICD-10-CM | POA: Diagnosis not present

## 2019-11-28 DIAGNOSIS — B9561 Methicillin susceptible Staphylococcus aureus infection as the cause of diseases classified elsewhere: Secondary | ICD-10-CM | POA: Diagnosis not present

## 2019-12-01 DIAGNOSIS — T82898A Other specified complication of vascular prosthetic devices, implants and grafts, initial encounter: Secondary | ICD-10-CM | POA: Diagnosis not present

## 2019-12-01 DIAGNOSIS — T827XXA Infection and inflammatory reaction due to other cardiac and vascular devices, implants and grafts, initial encounter: Secondary | ICD-10-CM | POA: Diagnosis not present

## 2019-12-01 DIAGNOSIS — D631 Anemia in chronic kidney disease: Secondary | ICD-10-CM | POA: Diagnosis not present

## 2019-12-01 DIAGNOSIS — Z992 Dependence on renal dialysis: Secondary | ICD-10-CM | POA: Diagnosis not present

## 2019-12-01 DIAGNOSIS — N186 End stage renal disease: Secondary | ICD-10-CM | POA: Diagnosis not present

## 2019-12-01 DIAGNOSIS — N250 Renal osteodystrophy: Secondary | ICD-10-CM | POA: Diagnosis not present

## 2019-12-02 ENCOUNTER — Encounter (INDEPENDENT_AMBULATORY_CARE_PROVIDER_SITE_OTHER): Payer: Medicare Other | Admitting: Ophthalmology

## 2019-12-03 DIAGNOSIS — T82898A Other specified complication of vascular prosthetic devices, implants and grafts, initial encounter: Secondary | ICD-10-CM | POA: Diagnosis not present

## 2019-12-03 DIAGNOSIS — N250 Renal osteodystrophy: Secondary | ICD-10-CM | POA: Diagnosis not present

## 2019-12-03 DIAGNOSIS — N186 End stage renal disease: Secondary | ICD-10-CM | POA: Diagnosis not present

## 2019-12-03 DIAGNOSIS — D631 Anemia in chronic kidney disease: Secondary | ICD-10-CM | POA: Diagnosis not present

## 2019-12-03 DIAGNOSIS — T827XXA Infection and inflammatory reaction due to other cardiac and vascular devices, implants and grafts, initial encounter: Secondary | ICD-10-CM | POA: Diagnosis not present

## 2019-12-03 DIAGNOSIS — Z992 Dependence on renal dialysis: Secondary | ICD-10-CM | POA: Diagnosis not present

## 2019-12-07 ENCOUNTER — Ambulatory Visit (INDEPENDENT_AMBULATORY_CARE_PROVIDER_SITE_OTHER): Payer: Medicare Other | Admitting: Clinical

## 2019-12-07 ENCOUNTER — Other Ambulatory Visit: Payer: Self-pay

## 2019-12-07 DIAGNOSIS — J439 Emphysema, unspecified: Secondary | ICD-10-CM | POA: Diagnosis not present

## 2019-12-07 DIAGNOSIS — F3341 Major depressive disorder, recurrent, in partial remission: Secondary | ICD-10-CM

## 2019-12-07 DIAGNOSIS — N186 End stage renal disease: Secondary | ICD-10-CM | POA: Diagnosis not present

## 2019-12-07 DIAGNOSIS — F17210 Nicotine dependence, cigarettes, uncomplicated: Secondary | ICD-10-CM | POA: Diagnosis not present

## 2019-12-07 DIAGNOSIS — Z299 Encounter for prophylactic measures, unspecified: Secondary | ICD-10-CM | POA: Diagnosis not present

## 2019-12-07 DIAGNOSIS — Z992 Dependence on renal dialysis: Secondary | ICD-10-CM | POA: Diagnosis not present

## 2019-12-07 DIAGNOSIS — L089 Local infection of the skin and subcutaneous tissue, unspecified: Secondary | ICD-10-CM | POA: Diagnosis not present

## 2019-12-07 NOTE — Progress Notes (Addendum)
   Virtual Visit via Video Note  I connected withViolet A Knapp on 12/07/19 at  2:00 PM EDT by a video enabled telemedicine application and verified that I am speaking with the correct person using two identifiers.  Location: Patient: Home Provider: Office   I discussed the limitations of evaluation and management by telemedicine and the availability of in person appointments. The patient expressed understanding and agreed to proceed.     THERAPIST PROGRESS NOTE  Session Time: 2:00PM-2:30PM  Participation Level: Active  Behavioral Response: CasualAlertDepressed  Type of Therapy: Individual Therapy  Treatment Goals addressed: Coping  Interventions: CBT  Summary: Kara Knapp is a 49 y.o. female who presents with Depression.The OPT therapist worked with thepatientfor herongoing OPT treatment session. The OPT therapist utilized Motivational Interviewing to assist in creating therapeutic repore. The patient in the session was engaged and work in Science writer about hertriggers and symptoms over the past few weeksincluding preparing for the transition of moving to a new apartment. The OPT therapist utilized Cognitive Behavioral Therapy through cognitive restructuring as well as worked with the patient on coping strategies to assist in management ofmood. The patient reported having a good 49th birthday and Mothers Day and getting to see family she has not seen since pre-covid.   Suicidal/Homicidal: Nowithout intent/plan  Therapist Response: The OPT therapist worked with the patient for the patients scheduled session. The patient was engaged in his session and gave feedback in relation to triggers, symptoms, and behavior responses over the pastfewweeks. The OPT therapist worked with the patient utilizing an in session Cognitive Behavioral Therapy exercise. The patient was responsive in the session and verbalized, " My mood has been much better  over the past few days I was able to celebrate my 43 th Birthday and Mothers Day and everyone got along good". The patient is currently working to prepare for the transition of moving within the next 30 days. The OPT therapist will continue treatment work with the patient in hernext scheduled session  Plan: Return again in 2 weeks.  Diagnosis:      Axis I: MDD (major depressive disorder), recurrent, in partial remission                           Axis II: No diagnosis  I discussed the assessment and treatment plan with the patient. The patient was provided an opportunity to ask questions and all were answered. The patient agreed with the plan and demonstrated an understanding of the instructions.  The patient was advised to call back or seek an in-person evaluation if the symptoms worsen or if the condition fails to improve as anticipated.  I provided 30 minutes of non-face-to-face time during this encounter.  Lennox Grumbles, LCSW 12/07/19

## 2019-12-08 DIAGNOSIS — Z992 Dependence on renal dialysis: Secondary | ICD-10-CM | POA: Diagnosis not present

## 2019-12-08 DIAGNOSIS — D631 Anemia in chronic kidney disease: Secondary | ICD-10-CM | POA: Diagnosis not present

## 2019-12-08 DIAGNOSIS — T827XXA Infection and inflammatory reaction due to other cardiac and vascular devices, implants and grafts, initial encounter: Secondary | ICD-10-CM | POA: Diagnosis not present

## 2019-12-08 DIAGNOSIS — N250 Renal osteodystrophy: Secondary | ICD-10-CM | POA: Diagnosis not present

## 2019-12-08 DIAGNOSIS — N186 End stage renal disease: Secondary | ICD-10-CM | POA: Diagnosis not present

## 2019-12-08 DIAGNOSIS — T82898A Other specified complication of vascular prosthetic devices, implants and grafts, initial encounter: Secondary | ICD-10-CM | POA: Diagnosis not present

## 2019-12-10 DIAGNOSIS — N250 Renal osteodystrophy: Secondary | ICD-10-CM | POA: Diagnosis not present

## 2019-12-10 DIAGNOSIS — T82898A Other specified complication of vascular prosthetic devices, implants and grafts, initial encounter: Secondary | ICD-10-CM | POA: Diagnosis not present

## 2019-12-10 DIAGNOSIS — T827XXA Infection and inflammatory reaction due to other cardiac and vascular devices, implants and grafts, initial encounter: Secondary | ICD-10-CM | POA: Diagnosis not present

## 2019-12-10 DIAGNOSIS — Z992 Dependence on renal dialysis: Secondary | ICD-10-CM | POA: Diagnosis not present

## 2019-12-10 DIAGNOSIS — D631 Anemia in chronic kidney disease: Secondary | ICD-10-CM | POA: Diagnosis not present

## 2019-12-10 DIAGNOSIS — N186 End stage renal disease: Secondary | ICD-10-CM | POA: Diagnosis not present

## 2019-12-12 DIAGNOSIS — N186 End stage renal disease: Secondary | ICD-10-CM | POA: Diagnosis not present

## 2019-12-12 DIAGNOSIS — D631 Anemia in chronic kidney disease: Secondary | ICD-10-CM | POA: Diagnosis not present

## 2019-12-12 DIAGNOSIS — T82898A Other specified complication of vascular prosthetic devices, implants and grafts, initial encounter: Secondary | ICD-10-CM | POA: Diagnosis not present

## 2019-12-12 DIAGNOSIS — N250 Renal osteodystrophy: Secondary | ICD-10-CM | POA: Diagnosis not present

## 2019-12-12 DIAGNOSIS — Z992 Dependence on renal dialysis: Secondary | ICD-10-CM | POA: Diagnosis not present

## 2019-12-12 DIAGNOSIS — T827XXA Infection and inflammatory reaction due to other cardiac and vascular devices, implants and grafts, initial encounter: Secondary | ICD-10-CM | POA: Diagnosis not present

## 2019-12-14 DIAGNOSIS — E1121 Type 2 diabetes mellitus with diabetic nephropathy: Secondary | ICD-10-CM | POA: Diagnosis not present

## 2019-12-14 DIAGNOSIS — F319 Bipolar disorder, unspecified: Secondary | ICD-10-CM | POA: Diagnosis not present

## 2019-12-14 DIAGNOSIS — Z299 Encounter for prophylactic measures, unspecified: Secondary | ICD-10-CM | POA: Diagnosis not present

## 2019-12-14 DIAGNOSIS — I10 Essential (primary) hypertension: Secondary | ICD-10-CM | POA: Diagnosis not present

## 2019-12-14 DIAGNOSIS — L089 Local infection of the skin and subcutaneous tissue, unspecified: Secondary | ICD-10-CM | POA: Diagnosis not present

## 2019-12-14 DIAGNOSIS — J439 Emphysema, unspecified: Secondary | ICD-10-CM | POA: Diagnosis not present

## 2019-12-15 ENCOUNTER — Telehealth: Payer: Self-pay

## 2019-12-15 DIAGNOSIS — N250 Renal osteodystrophy: Secondary | ICD-10-CM | POA: Diagnosis not present

## 2019-12-15 DIAGNOSIS — N186 End stage renal disease: Secondary | ICD-10-CM | POA: Diagnosis not present

## 2019-12-15 DIAGNOSIS — Z992 Dependence on renal dialysis: Secondary | ICD-10-CM | POA: Diagnosis not present

## 2019-12-15 DIAGNOSIS — T82898A Other specified complication of vascular prosthetic devices, implants and grafts, initial encounter: Secondary | ICD-10-CM | POA: Diagnosis not present

## 2019-12-15 DIAGNOSIS — T827XXA Infection and inflammatory reaction due to other cardiac and vascular devices, implants and grafts, initial encounter: Secondary | ICD-10-CM | POA: Diagnosis not present

## 2019-12-15 DIAGNOSIS — D631 Anemia in chronic kidney disease: Secondary | ICD-10-CM | POA: Diagnosis not present

## 2019-12-15 NOTE — Telephone Encounter (Signed)
Telephone call received from [name], PA reporting pts Lt upper arm graft site placed on 11/18/19 is red/inflammed/hot. No fever present. Pt had previously been seen by PCP and received two rounds of oral Abx, amoxicillin and keflex. He states site looks worse than before. Pt was given IV Abx today at dialysis.   On call provider Dr. Scot Dock was notified and advised to schedule patient for office visit on tomorrow. Pt scheduled with PA for 5/19, arrive at 2pm. Patient informed and verbalized understanding. Minette Brine, RN

## 2019-12-16 ENCOUNTER — Other Ambulatory Visit: Payer: Self-pay

## 2019-12-16 ENCOUNTER — Ambulatory Visit (INDEPENDENT_AMBULATORY_CARE_PROVIDER_SITE_OTHER): Payer: Self-pay | Admitting: Physician Assistant

## 2019-12-16 VITALS — BP 136/60 | HR 70 | Temp 98.2°F | Resp 16 | Ht 63.0 in | Wt 189.8 lb

## 2019-12-16 DIAGNOSIS — N186 End stage renal disease: Secondary | ICD-10-CM

## 2019-12-16 DIAGNOSIS — Z992 Dependence on renal dialysis: Secondary | ICD-10-CM
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
## 2019-12-16 NOTE — Progress Notes (Addendum)
    Postoperative Access Visit   History of Present Illness   Kara Knapp is a 49 y.o. year old female who presents for postoperative follow-up for: left upper arm arteriovenous graft on 11/18/19 by Dr. Trula Slade. She is here today with her caregiver since she is legally blind. She presents today with two week history of some swelling, redness and warmth around left Antecubital incision. She was started on Amoxicillin about 1 week ago per PCP. On Monday 5/17 she followed up with PCP stating that arm was not getting better and was switched to Keflex. She took the keflex for one day but in discussion with techs at Dialysis yesterday she received IV antibiotics during her dialysis session. She feels that her arm is already feeling better with less soreness. Per her caregiver there has been some yellow dried drainage overlying the incision usually every morning. No active drainage or bleeding. States the scabs along incision have been present since surgery. She denies any fever or chills.   She dialyzes currently Tues/Thurs/ Sat via right IJ TDC. The patient notes no steal symptoms in left upper extremity.  She has hx of occluded right arm AV graft. She has had bilateral upper extremity venogram showing no usable vein for fistula creation.  She is currently on Eliquis  The patients PMH, PSH, SH and FamHx were reviewed and are unchanged from prior visit.  Physical Examination   Vitals:   12/16/19 1053  BP: 136/60  Pulse: 70  Resp: 16  Temp: 98.2 F (36.8 C)  TempSrc: Oral  SpO2: 97%  Weight: 189 lb 12.8 oz (86.1 kg)  Height: 5' 3"  (1.6 m)   Body mass index is 33.62 kg/m.  left arm Axillary Incision is well healed, Antecubital incision is still healing with three areas of small scabs- proximal, mid, and distal. no drainage from these areas.There is some erythema surrounding incision. Mild ecchymosis of the left upper arm. 2+ radial pulse, hand grip is 5/5, sensation in digits is intact,  palpable thrill, bruit can  be auscultated     Medical Decision Making   Kara Knapp is a 49 y.o. year old female who presents s/p left upper arm arteriovenous graft 11/18/19 by Dr. Trula Slade. She presents today with infected incision. I don't feel that her graft is compromised at this time. Would recommend continued IV antibiotics at dialysis for next two weeks. She has limited options for dialysis access should this graft need removed. Saw patient with Dr. Oneida Alar who agrees with the plan for management of this patient. I will schedule her to follow up in 2 weeks with Dr. Trula Slade to re-evaluate. Advised patient to call for earlier follow up if she has worsening signs of infection such as increased redness, swelling, pain, fever or chills. Patient and her caretaker voiced their understanding. I will forward this note to referring provider [name] PA-C.   Karoline Caldwell, PA-C [facility] Office: [phone]  Clinic MD: Dr. Oneida Alar

## 2019-12-17 ENCOUNTER — Ambulatory Visit: Payer: Medicare Other

## 2019-12-17 DIAGNOSIS — T82898A Other specified complication of vascular prosthetic devices, implants and grafts, initial encounter: Secondary | ICD-10-CM | POA: Diagnosis not present

## 2019-12-17 DIAGNOSIS — Z992 Dependence on renal dialysis: Secondary | ICD-10-CM | POA: Diagnosis not present

## 2019-12-17 DIAGNOSIS — N186 End stage renal disease: Secondary | ICD-10-CM | POA: Diagnosis not present

## 2019-12-17 DIAGNOSIS — N250 Renal osteodystrophy: Secondary | ICD-10-CM | POA: Diagnosis not present

## 2019-12-17 DIAGNOSIS — D631 Anemia in chronic kidney disease: Secondary | ICD-10-CM | POA: Diagnosis not present

## 2019-12-17 DIAGNOSIS — T827XXA Infection and inflammatory reaction due to other cardiac and vascular devices, implants and grafts, initial encounter: Secondary | ICD-10-CM | POA: Diagnosis not present

## 2019-12-18 ENCOUNTER — Telehealth: Payer: Self-pay

## 2019-12-18 ENCOUNTER — Other Ambulatory Visit: Payer: Self-pay | Admitting: Family Medicine

## 2019-12-18 DIAGNOSIS — M549 Dorsalgia, unspecified: Secondary | ICD-10-CM

## 2019-12-18 DIAGNOSIS — M62838 Other muscle spasm: Secondary | ICD-10-CM

## 2019-12-18 NOTE — Telephone Encounter (Signed)
Telephone call received from patient to report a wound cx obtained by PCP on 12/14/19 from Lt arm graft site showed staph and MRSA. Wound cx obtained from dialysis center on 5/18 showed no organisms growing yet. Pt is currently receiving IV vancomycin. Reports symptoms are improving and checks temperature daily for fever.   Informed Rexene Agent., PA of information provided by patient. Pt should continue antibiotics and call for any worsening symptoms, including fever or chills. Patient informed of recommendations and verbalized understanding. Minette Brine, RN

## 2019-12-19 DIAGNOSIS — Z992 Dependence on renal dialysis: Secondary | ICD-10-CM | POA: Diagnosis not present

## 2019-12-19 DIAGNOSIS — T827XXA Infection and inflammatory reaction due to other cardiac and vascular devices, implants and grafts, initial encounter: Secondary | ICD-10-CM | POA: Diagnosis not present

## 2019-12-19 DIAGNOSIS — D631 Anemia in chronic kidney disease: Secondary | ICD-10-CM | POA: Diagnosis not present

## 2019-12-19 DIAGNOSIS — T82898A Other specified complication of vascular prosthetic devices, implants and grafts, initial encounter: Secondary | ICD-10-CM | POA: Diagnosis not present

## 2019-12-19 DIAGNOSIS — N250 Renal osteodystrophy: Secondary | ICD-10-CM | POA: Diagnosis not present

## 2019-12-19 DIAGNOSIS — N186 End stage renal disease: Secondary | ICD-10-CM | POA: Diagnosis not present

## 2019-12-22 DIAGNOSIS — Z992 Dependence on renal dialysis: Secondary | ICD-10-CM | POA: Diagnosis not present

## 2019-12-22 DIAGNOSIS — T82898A Other specified complication of vascular prosthetic devices, implants and grafts, initial encounter: Secondary | ICD-10-CM | POA: Diagnosis not present

## 2019-12-22 DIAGNOSIS — T827XXA Infection and inflammatory reaction due to other cardiac and vascular devices, implants and grafts, initial encounter: Secondary | ICD-10-CM | POA: Diagnosis not present

## 2019-12-22 DIAGNOSIS — N186 End stage renal disease: Secondary | ICD-10-CM | POA: Diagnosis not present

## 2019-12-22 DIAGNOSIS — N250 Renal osteodystrophy: Secondary | ICD-10-CM | POA: Diagnosis not present

## 2019-12-22 DIAGNOSIS — D631 Anemia in chronic kidney disease: Secondary | ICD-10-CM | POA: Diagnosis not present

## 2019-12-23 ENCOUNTER — Encounter (HOSPITAL_COMMUNITY): Payer: Self-pay | Admitting: Psychiatry

## 2019-12-23 ENCOUNTER — Telehealth (INDEPENDENT_AMBULATORY_CARE_PROVIDER_SITE_OTHER): Payer: Medicare Other | Admitting: Psychiatry

## 2019-12-23 ENCOUNTER — Other Ambulatory Visit: Payer: Self-pay

## 2019-12-23 DIAGNOSIS — F3341 Major depressive disorder, recurrent, in partial remission: Secondary | ICD-10-CM

## 2019-12-23 MED ORDER — ARIPIPRAZOLE 5 MG PO TABS: 5.0000 mg | ORAL_TABLET | Freq: Every day | ORAL | 0 refills | Status: DC

## 2019-12-23 MED ORDER — SERTRALINE HCL 100 MG PO TABS: 150.0000 mg | ORAL_TABLET | Freq: Every day | ORAL | 0 refills | Status: DC

## 2019-12-23 NOTE — Patient Instructions (Signed)
1.Continuesertraline 150 mg daily 2. Continue Abilify 5 mg at night  3. Next appointment: 8/17 at 10:20

## 2019-12-24 DIAGNOSIS — D631 Anemia in chronic kidney disease: Secondary | ICD-10-CM | POA: Diagnosis not present

## 2019-12-24 DIAGNOSIS — N250 Renal osteodystrophy: Secondary | ICD-10-CM | POA: Diagnosis not present

## 2019-12-24 DIAGNOSIS — N186 End stage renal disease: Secondary | ICD-10-CM | POA: Diagnosis not present

## 2019-12-24 DIAGNOSIS — Z992 Dependence on renal dialysis: Secondary | ICD-10-CM | POA: Diagnosis not present

## 2019-12-24 DIAGNOSIS — T827XXA Infection and inflammatory reaction due to other cardiac and vascular devices, implants and grafts, initial encounter: Secondary | ICD-10-CM | POA: Diagnosis not present

## 2019-12-24 DIAGNOSIS — T82898A Other specified complication of vascular prosthetic devices, implants and grafts, initial encounter: Secondary | ICD-10-CM | POA: Diagnosis not present

## 2019-12-26 DIAGNOSIS — T82898A Other specified complication of vascular prosthetic devices, implants and grafts, initial encounter: Secondary | ICD-10-CM | POA: Diagnosis not present

## 2019-12-26 DIAGNOSIS — Z992 Dependence on renal dialysis: Secondary | ICD-10-CM | POA: Diagnosis not present

## 2019-12-26 DIAGNOSIS — D631 Anemia in chronic kidney disease: Secondary | ICD-10-CM | POA: Diagnosis not present

## 2019-12-26 DIAGNOSIS — N186 End stage renal disease: Secondary | ICD-10-CM | POA: Diagnosis not present

## 2019-12-26 DIAGNOSIS — N250 Renal osteodystrophy: Secondary | ICD-10-CM | POA: Diagnosis not present

## 2019-12-26 DIAGNOSIS — T827XXA Infection and inflammatory reaction due to other cardiac and vascular devices, implants and grafts, initial encounter: Secondary | ICD-10-CM | POA: Diagnosis not present

## 2019-12-28 DIAGNOSIS — Z992 Dependence on renal dialysis: Secondary | ICD-10-CM | POA: Diagnosis not present

## 2019-12-28 DIAGNOSIS — N186 End stage renal disease: Secondary | ICD-10-CM | POA: Diagnosis not present

## 2019-12-29 DIAGNOSIS — B9562 Methicillin resistant Staphylococcus aureus infection as the cause of diseases classified elsewhere: Secondary | ICD-10-CM | POA: Diagnosis not present

## 2019-12-29 DIAGNOSIS — N250 Renal osteodystrophy: Secondary | ICD-10-CM | POA: Diagnosis not present

## 2019-12-29 DIAGNOSIS — D631 Anemia in chronic kidney disease: Secondary | ICD-10-CM | POA: Diagnosis not present

## 2019-12-29 DIAGNOSIS — B9561 Methicillin susceptible Staphylococcus aureus infection as the cause of diseases classified elsewhere: Secondary | ICD-10-CM | POA: Diagnosis not present

## 2019-12-29 DIAGNOSIS — D509 Iron deficiency anemia, unspecified: Secondary | ICD-10-CM | POA: Diagnosis not present

## 2019-12-29 DIAGNOSIS — N186 End stage renal disease: Secondary | ICD-10-CM | POA: Diagnosis not present

## 2019-12-29 DIAGNOSIS — Z992 Dependence on renal dialysis: Secondary | ICD-10-CM | POA: Diagnosis not present

## 2019-12-29 DIAGNOSIS — T827XXA Infection and inflammatory reaction due to other cardiac and vascular devices, implants and grafts, initial encounter: Secondary | ICD-10-CM | POA: Diagnosis not present

## 2019-12-30 ENCOUNTER — Telehealth (HOSPITAL_COMMUNITY): Payer: Self-pay | Admitting: Clinical

## 2019-12-30 ENCOUNTER — Other Ambulatory Visit: Payer: Self-pay

## 2019-12-30 ENCOUNTER — Ambulatory Visit (HOSPITAL_COMMUNITY): Payer: Medicare Other | Admitting: Clinical

## 2019-12-30 NOTE — Telephone Encounter (Signed)
The OPT therapist sent text to session x2 @ 2:00PM and 2:10PM , however, the patient did not respond missing her scheduled session

## 2019-12-31 DIAGNOSIS — N186 End stage renal disease: Secondary | ICD-10-CM | POA: Diagnosis not present

## 2019-12-31 DIAGNOSIS — D631 Anemia in chronic kidney disease: Secondary | ICD-10-CM | POA: Diagnosis not present

## 2019-12-31 DIAGNOSIS — T827XXA Infection and inflammatory reaction due to other cardiac and vascular devices, implants and grafts, initial encounter: Secondary | ICD-10-CM | POA: Diagnosis not present

## 2019-12-31 DIAGNOSIS — D509 Iron deficiency anemia, unspecified: Secondary | ICD-10-CM | POA: Diagnosis not present

## 2019-12-31 DIAGNOSIS — N250 Renal osteodystrophy: Secondary | ICD-10-CM | POA: Diagnosis not present

## 2019-12-31 DIAGNOSIS — Z992 Dependence on renal dialysis: Secondary | ICD-10-CM | POA: Diagnosis not present

## 2020-01-01 DIAGNOSIS — N186 End stage renal disease: Secondary | ICD-10-CM | POA: Diagnosis not present

## 2020-01-01 DIAGNOSIS — N898 Other specified noninflammatory disorders of vagina: Secondary | ICD-10-CM | POA: Diagnosis not present

## 2020-01-01 DIAGNOSIS — F17210 Nicotine dependence, cigarettes, uncomplicated: Secondary | ICD-10-CM | POA: Diagnosis not present

## 2020-01-01 DIAGNOSIS — Z6833 Body mass index (BMI) 33.0-33.9, adult: Secondary | ICD-10-CM | POA: Diagnosis not present

## 2020-01-01 DIAGNOSIS — Z992 Dependence on renal dialysis: Secondary | ICD-10-CM | POA: Diagnosis not present

## 2020-01-01 DIAGNOSIS — J439 Emphysema, unspecified: Secondary | ICD-10-CM | POA: Diagnosis not present

## 2020-01-01 DIAGNOSIS — Z299 Encounter for prophylactic measures, unspecified: Secondary | ICD-10-CM | POA: Diagnosis not present

## 2020-01-02 DIAGNOSIS — N250 Renal osteodystrophy: Secondary | ICD-10-CM | POA: Diagnosis not present

## 2020-01-02 DIAGNOSIS — D631 Anemia in chronic kidney disease: Secondary | ICD-10-CM | POA: Diagnosis not present

## 2020-01-02 DIAGNOSIS — Z992 Dependence on renal dialysis: Secondary | ICD-10-CM | POA: Diagnosis not present

## 2020-01-02 DIAGNOSIS — D509 Iron deficiency anemia, unspecified: Secondary | ICD-10-CM | POA: Diagnosis not present

## 2020-01-02 DIAGNOSIS — T827XXA Infection and inflammatory reaction due to other cardiac and vascular devices, implants and grafts, initial encounter: Secondary | ICD-10-CM | POA: Diagnosis not present

## 2020-01-02 DIAGNOSIS — N186 End stage renal disease: Secondary | ICD-10-CM | POA: Diagnosis not present

## 2020-01-04 ENCOUNTER — Ambulatory Visit (INDEPENDENT_AMBULATORY_CARE_PROVIDER_SITE_OTHER): Payer: Self-pay | Admitting: Physician Assistant

## 2020-01-04 ENCOUNTER — Other Ambulatory Visit: Payer: Self-pay

## 2020-01-04 VITALS — BP 176/94 | HR 70 | Temp 97.3°F | Resp 14 | Ht 63.0 in | Wt 190.0 lb

## 2020-01-04 DIAGNOSIS — Z992 Dependence on renal dialysis: Secondary | ICD-10-CM

## 2020-01-04 DIAGNOSIS — N186 End stage renal disease: Secondary | ICD-10-CM

## 2020-01-04 NOTE — Progress Notes (Signed)
POST OPERATIVE OFFICE NOTE    CC:  F/u for surgery  HPI:  This is a 49 y.o. female who is s/p left AV graft placement  On 11/18/2019 by Dr. Trula Slade.  Pt returns today for follow up.   She denise pain, loss of sensation or motor in the left UE.  Right TDC placed by IR 10/09/2019.    Allergies  Allergen Reactions  . Sulfamethoxazole-Trimethoprim Nausea And Vomiting  . Bactrim [Sulfamethoxazole-Trimethoprim] Nausea And Vomiting  . Prednisone Other (See Comments)    "I was wide open and couldn't eat" per pt. Loss of appetite and insomnia  LOSS OF APPETITE,UNABLE TO SLEEP    Current Outpatient Medications  Medication Sig Dispense Refill  . acetaminophen (TYLENOL) 500 MG tablet Take 1,000 mg by mouth every 6 (six) hours as needed for moderate pain or headache.    . ARIPiprazole (ABILIFY) 5 MG tablet Take 1 tablet (5 mg total) by mouth daily. 90 tablet 0  . azelastine (ASTELIN) 0.1 % nasal spray Place 1 spray into both nostrils 2 (two) times daily. Use in each nostril as directed (Patient taking differently: Place 1 spray into both nostrils daily as needed for rhinitis. ) 30 mL 12  . carvedilol (COREG) 12.5 MG tablet Take 12.5 mg by mouth 2 (two) times daily.    . cholecalciferol (VITAMIN D) 1000 units tablet Take 1,000 Units by mouth daily.     . Continuous Blood Gluc Sensor (FREESTYLE LIBRE 14 DAY SENSOR) MISC Inject 1 each into the skin every 14 (fourteen) days. Use as directed. 2 each 2  . cyclobenzaprine (FLEXERIL) 5 MG tablet TAKE ONE TABLET BY MOUTH THREE TIMES DAILY AS NEEDED FOR MUSCLE SPASM 60 tablet 3  . diclofenac sodium (VOLTAREN) 1 % GEL Apply 2 g topically 4 (four) times daily. (Patient taking differently: Apply 2 g topically 4 (four) times daily as needed (pain). ) 200 g 3  . gabapentin (NEURONTIN) 100 MG capsule Take 1 capsule (100 mg total) by mouth 3 (three) times daily. (Needs to be seen before next refill) 90 capsule 0  . glucose blood (PRODIGY NO CODING BLOOD GLUC) test  strip USE TO CHECK BLOOD SUGAR TWICE DAILY. Dx E11.22 200 each 3  . ibuprofen (ADVIL) 200 MG tablet Take 600 mg by mouth every 6 (six) hours as needed for headache or moderate pain.    . Insulin Pen Needle (TRUEPLUS PEN NEEDLES) 31G X 5 MM MISC Use daily with insulin Dx E11.22 100 each 3  . LANTUS SOLOSTAR 100 UNIT/ML Solostar Pen INJECT 18-50 UNITS SUBCUTANEOUSLY AT BEDTIME. (Patient taking differently: Inject 20 Units into the skin at bedtime. ) 15 mL 0  . lidocaine-prilocaine (EMLA) cream Apply 1 application topically daily as needed (arm port).    Marland Kitchen linagliptin (TRADJENTA) 5 MG TABS tablet Take 1 tablet (5 mg total) by mouth daily. (Patient taking differently: Take 5 mg by mouth every evening. ) 90 tablet 3  . multivitamin (RENA-VIT) TABS tablet Take 1 tablet by mouth daily.    . pantoprazole (PROTONIX) 40 MG tablet TAKE 1 BY MOUTH DAILY 30 minutes before breakfast (Patient taking differently: Take 40 mg by mouth daily. ) 90 tablet 3  . Probiotic Product (CULTURELLE PROBIOTICS PO) Take 1 capsule by mouth daily.     Marland Kitchen PRODIGY TWIST TOP LANCETS 28G MISC USE TO CHECK BLOOD SUGAR UP TO FOUR TIMES DAILY. 100 each 1  . [START ON 01/12/2020] sertraline (ZOLOFT) 100 MG tablet Take 1.5 tablets (150 mg total)  by mouth daily. 135 tablet 0  . sevelamer carbonate (RENVELA) 800 MG tablet Take 800-2,400 mg by mouth See admin instructions. Take 2469m by mouth three times daily with means and 801mwith snacks    . SYMBICORT 160-4.5 MCG/ACT inhaler     . traZODone (DESYREL) 50 MG tablet Take 0.5-1 tablets (25-50 mg total) by mouth at bedtime as needed. for sleep (Patient taking differently: Take 25 mg by mouth at bedtime as needed for sleep. ) 90 tablet 3  . vancomycin IVPB Inject into the vein. With Dialysis.    . Marland KitchenENTOLIN HFA 108 (90 Base) MCG/ACT inhaler     . apixaban (ELIQUIS) 2.5 MG TABS tablet Take 2.5 mg by mouth 2 (two) times daily.    . Marland KitchenxyCODONE-acetaminophen (PERCOCET) 7.5-325 MG tablet Take 1 tablet  by mouth every 4 (four) hours as needed for moderate pain or severe pain. (Patient not taking: Reported on 01/04/2020) 20 tablet 0   No current facility-administered medications for this visit.     ROS:  See HPI  Physical Exam:    Incision:  Well healed Extremities:  Sensation, grip 5/5 and palpable radial pulse as well as thrill in graft. Lungs: non labored breathing   Assessment/Plan:  This is a [age].o. female who is s/p: left AV graft   Graft has good thrill with well healed incisions.  The graft may be accessed for HD.  [facility].  EmRoxy HorsemanA-C [facility] [phone] MD:  BrTrula Slade

## 2020-01-05 DIAGNOSIS — T827XXA Infection and inflammatory reaction due to other cardiac and vascular devices, implants and grafts, initial encounter: Secondary | ICD-10-CM | POA: Diagnosis not present

## 2020-01-05 DIAGNOSIS — D631 Anemia in chronic kidney disease: Secondary | ICD-10-CM | POA: Diagnosis not present

## 2020-01-05 DIAGNOSIS — Z992 Dependence on renal dialysis: Secondary | ICD-10-CM | POA: Diagnosis not present

## 2020-01-05 DIAGNOSIS — N250 Renal osteodystrophy: Secondary | ICD-10-CM | POA: Diagnosis not present

## 2020-01-05 DIAGNOSIS — D509 Iron deficiency anemia, unspecified: Secondary | ICD-10-CM | POA: Diagnosis not present

## 2020-01-05 DIAGNOSIS — N186 End stage renal disease: Secondary | ICD-10-CM | POA: Diagnosis not present

## 2020-01-06 ENCOUNTER — Encounter (INDEPENDENT_AMBULATORY_CARE_PROVIDER_SITE_OTHER): Payer: Medicare Other | Admitting: Ophthalmology

## 2020-01-07 DIAGNOSIS — Z992 Dependence on renal dialysis: Secondary | ICD-10-CM | POA: Diagnosis not present

## 2020-01-07 DIAGNOSIS — D509 Iron deficiency anemia, unspecified: Secondary | ICD-10-CM | POA: Diagnosis not present

## 2020-01-07 DIAGNOSIS — N186 End stage renal disease: Secondary | ICD-10-CM | POA: Diagnosis not present

## 2020-01-07 DIAGNOSIS — D631 Anemia in chronic kidney disease: Secondary | ICD-10-CM | POA: Diagnosis not present

## 2020-01-07 DIAGNOSIS — T827XXA Infection and inflammatory reaction due to other cardiac and vascular devices, implants and grafts, initial encounter: Secondary | ICD-10-CM | POA: Diagnosis not present

## 2020-01-07 DIAGNOSIS — N250 Renal osteodystrophy: Secondary | ICD-10-CM | POA: Diagnosis not present

## 2020-01-08 ENCOUNTER — Other Ambulatory Visit (HOSPITAL_COMMUNITY): Payer: Self-pay | Admitting: Nephrology

## 2020-01-08 DIAGNOSIS — N186 End stage renal disease: Secondary | ICD-10-CM

## 2020-01-09 DIAGNOSIS — D631 Anemia in chronic kidney disease: Secondary | ICD-10-CM | POA: Diagnosis not present

## 2020-01-09 DIAGNOSIS — N186 End stage renal disease: Secondary | ICD-10-CM | POA: Diagnosis not present

## 2020-01-09 DIAGNOSIS — Z992 Dependence on renal dialysis: Secondary | ICD-10-CM | POA: Diagnosis not present

## 2020-01-09 DIAGNOSIS — D509 Iron deficiency anemia, unspecified: Secondary | ICD-10-CM | POA: Diagnosis not present

## 2020-01-09 DIAGNOSIS — T827XXA Infection and inflammatory reaction due to other cardiac and vascular devices, implants and grafts, initial encounter: Secondary | ICD-10-CM | POA: Diagnosis not present

## 2020-01-09 DIAGNOSIS — N250 Renal osteodystrophy: Secondary | ICD-10-CM | POA: Diagnosis not present

## 2020-01-12 DIAGNOSIS — N250 Renal osteodystrophy: Secondary | ICD-10-CM | POA: Diagnosis not present

## 2020-01-12 DIAGNOSIS — Z992 Dependence on renal dialysis: Secondary | ICD-10-CM | POA: Diagnosis not present

## 2020-01-12 DIAGNOSIS — D509 Iron deficiency anemia, unspecified: Secondary | ICD-10-CM | POA: Diagnosis not present

## 2020-01-12 DIAGNOSIS — D631 Anemia in chronic kidney disease: Secondary | ICD-10-CM | POA: Diagnosis not present

## 2020-01-12 DIAGNOSIS — T827XXA Infection and inflammatory reaction due to other cardiac and vascular devices, implants and grafts, initial encounter: Secondary | ICD-10-CM | POA: Diagnosis not present

## 2020-01-12 DIAGNOSIS — N186 End stage renal disease: Secondary | ICD-10-CM | POA: Diagnosis not present

## 2020-01-13 ENCOUNTER — Ambulatory Visit (INDEPENDENT_AMBULATORY_CARE_PROVIDER_SITE_OTHER): Payer: Medicare Other | Admitting: Ophthalmology

## 2020-01-13 ENCOUNTER — Other Ambulatory Visit: Payer: Self-pay

## 2020-01-13 ENCOUNTER — Encounter (INDEPENDENT_AMBULATORY_CARE_PROVIDER_SITE_OTHER): Payer: Self-pay | Admitting: Ophthalmology

## 2020-01-13 DIAGNOSIS — E113511 Type 2 diabetes mellitus with proliferative diabetic retinopathy with macular edema, right eye: Secondary | ICD-10-CM

## 2020-01-13 DIAGNOSIS — H211X1 Other vascular disorders of iris and ciliary body, right eye: Secondary | ICD-10-CM | POA: Diagnosis not present

## 2020-01-13 DIAGNOSIS — H4311 Vitreous hemorrhage, right eye: Secondary | ICD-10-CM | POA: Diagnosis not present

## 2020-01-13 MED ORDER — BEVACIZUMAB CHEMO INJECTION 1.25MG/0.05ML SYRINGE FOR KALEIDOSCOPE
1.2500 mg | INTRAVITREAL | Status: AC | PRN
  Administered 2020-01-13: 1.25 mg via INTRAVITREAL

## 2020-01-13 NOTE — Progress Notes (Signed)
01/13/2020     CHIEF COMPLAINT Patient presents for Retina Follow Up   HISTORY OF PRESENT ILLNESS: Kara Knapp is a 49 y.o. female who presents to the clinic today for:   HPI    Retina Follow Up    Patient presents with  Diabetic Retinopathy.  In right eye.  This started 3 months ago.  Severity is moderate.  Duration of 3 months.  Since onset it is stable.          Comments    3 Month Diabetic Exam OD, poss Avastin OD  Pt denies noticeable changes to [place] OU since last visit. Pt denies ocular pain, flashes of light, or floaters OU.  Pt sts she has combigan drops but doesn't know if she is supposed to be using it. Patient has a history of chronic recurrent vitreous hemorrhage in the right eye on the basis of advanced old quiescent proliferative diabetic retinopathy with residual fibrovascular frond on the optic nerve.  LBS: 158 this AM A1c: 7.2, 10/2019       Last edited by Hurman Horn, MD on 01/13/2020  2:56 PM. (History)      Referring physician: Dettinger, Fransisca Kaufmann, MD [place],  [place] [id number]  HISTORICAL INFORMATION:   Selected notes from the MEDICAL RECORD NUMBER    Lab Results  Component Value Date   HGBA1C 7.2 (H) 08/29/2018     CURRENT MEDICATIONS: No current outpatient medications on file. (Ophthalmic Drugs)   No current facility-administered medications for this visit. (Ophthalmic Drugs)   Current Outpatient Medications (Other)  Medication Sig  . acetaminophen (TYLENOL) 500 MG tablet Take 1,000 mg by mouth every 6 (six) hours as needed for moderate pain or headache.  Marland Kitchen apixaban (ELIQUIS) 2.5 MG TABS tablet Take 2.5 mg by mouth 2 (two) times daily.  . ARIPiprazole (ABILIFY) 5 MG tablet Take 1 tablet (5 mg total) by mouth daily.  Marland Kitchen azelastine (ASTELIN) 0.1 % nasal spray Place 1 spray into both nostrils 2 (two) times daily. Use in each nostril as directed (Patient taking differently: Place 1 spray into both nostrils daily as needed for  rhinitis. )  . carvedilol (COREG) 12.5 MG tablet Take 12.5 mg by mouth 2 (two) times daily.  . cholecalciferol (VITAMIN D) 1000 units tablet Take 1,000 Units by mouth daily.   . Continuous Blood Gluc Sensor (FREESTYLE LIBRE 14 DAY SENSOR) MISC Inject 1 each into the skin every 14 (fourteen) days. Use as directed.  . cyclobenzaprine (FLEXERIL) 5 MG tablet TAKE ONE TABLET BY MOUTH THREE TIMES DAILY AS NEEDED FOR MUSCLE SPASM  . diclofenac sodium (VOLTAREN) 1 % GEL Apply 2 g topically 4 (four) times daily. (Patient taking differently: Apply 2 g topically 4 (four) times daily as needed (pain). )  . gabapentin (NEURONTIN) 100 MG capsule Take 1 capsule (100 mg total) by mouth 3 (three) times daily. (Needs to be seen before next refill)  . glucose blood (PRODIGY NO CODING BLOOD GLUC) test strip USE TO CHECK BLOOD SUGAR TWICE DAILY. Dx E11.22  . ibuprofen (ADVIL) 200 MG tablet Take 600 mg by mouth every 6 (six) hours as needed for headache or moderate pain.  . Insulin Pen Needle (TRUEPLUS PEN NEEDLES) 31G X 5 MM MISC Use daily with insulin Dx E11.22  . LANTUS SOLOSTAR 100 UNIT/ML Solostar Pen INJECT 18-50 UNITS SUBCUTANEOUSLY AT BEDTIME. (Patient taking differently: Inject 20 Units into the skin at bedtime. )  . lidocaine-prilocaine (EMLA) cream Apply  1 application topically daily as needed (arm port).  Marland Kitchen linagliptin (TRADJENTA) 5 MG TABS tablet Take 1 tablet (5 mg total) by mouth daily. (Patient taking differently: Take 5 mg by mouth every evening. )  . multivitamin (RENA-VIT) TABS tablet Take 1 tablet by mouth daily.  Marland Kitchen oxyCODONE-acetaminophen (PERCOCET) 7.5-325 MG tablet Take 1 tablet by mouth every 4 (four) hours as needed for moderate pain or severe pain. (Patient not taking: Reported on 01/04/2020)  . pantoprazole (PROTONIX) 40 MG tablet TAKE 1 BY MOUTH DAILY 30 minutes before breakfast (Patient taking differently: Take 40 mg by mouth daily. )  . Probiotic Product (CULTURELLE PROBIOTICS PO) Take 1  capsule by mouth daily.   Marland Kitchen PRODIGY TWIST TOP LANCETS 28G MISC USE TO CHECK BLOOD SUGAR UP TO FOUR TIMES DAILY.  Marland Kitchen sertraline (ZOLOFT) 100 MG tablet Take 1.5 tablets (150 mg total) by mouth daily.  . sevelamer carbonate (RENVELA) 800 MG tablet Take 800-2,400 mg by mouth See admin instructions. Take 2443m by mouth three times daily with means and 8051mwith snacks  . SYMBICORT 160-4.5 MCG/ACT inhaler   . traZODone (DESYREL) 50 MG tablet Take 0.5-1 tablets (25-50 mg total) by mouth at bedtime as needed. for sleep (Patient taking differently: Take 25 mg by mouth at bedtime as needed for sleep. )  . vancomycin IVPB Inject into the vein. With Dialysis.  . Marland KitchenENTOLIN HFA 108 (90 Base) MCG/ACT inhaler    No current facility-administered medications for this visit. (Other)      REVIEW OF SYSTEMS:    ALLERGIES Allergies  Allergen Reactions  . Sulfamethoxazole-Trimethoprim Nausea And Vomiting  . Bactrim [Sulfamethoxazole-Trimethoprim] Nausea And Vomiting  . Prednisone Other (See Comments)    "I was wide open and couldn't eat" per pt. Loss of appetite and insomnia  LOSS OF APPETITE,UNABLE TO SLEEP    PAST MEDICAL HISTORY Past Medical History:  Diagnosis Date  . Anemia of chronic disease   . Anxiety   . Asthma   . Blind left eye   . Bronchitis   . Cataract   . Cholecystitis, acute 05/26/2013   Status post cholecystectomy  . Chronic abdominal pain   . Chronic diarrhea   . COPD (chronic obstructive pulmonary disease) ([place]  . Depression   . Diabetic foot ulcer (HCG[place] City08/08/2014  . Diastolic heart failure (HCA[place]  . ESRD on hemodialysis ([facility]   Started diaylsis 12/29/15  . Essential hypertension   . Fibroids   . Glaucoma   . History of blood transfusion   . History of pneumonia   . Hyperlipidemia   . Insulin-dependent diabetes mellitus with retinopathy   . Liver fibrosis    Negative Hep B surface antigen, negative Hep C antibody Feb 2018 (see scanned in labs).  . Neuropathy   .  Osteomyelitis (HCNorth St. Paul   Toe on left foot   Past Surgical History:  Procedure Laterality Date  . A/V SHUNTOGRAM N/A 10/25/2016   Procedure: A/V Shuntogram - Right Arm;  Surgeon: BrWaynetta SandyMD;  [facility];  Service: Cardiovascular;  Laterality: N/A;  . A/V SHUNTOGRAM N/A 03/05/2018   Procedure: A/V SHUNTOGRAM - Right Arm;  Surgeon: CaWaynetta SandyMD;  [facility];  Service: Cardiovascular;  Laterality: N/A;  . AV FISTULA PLACEMENT Right 10/17/2015   Procedure: INSERTION OF ARTERIOVENOUS GORE-TEX GRAFT RIGHT UPPER ARM WITH ACUSEAL;  Surgeon: BrConrad BurlingtonMD;  [facility] Service: Vascular;  Laterality: Right;  .  AV FISTULA PLACEMENT Left 11/18/2019   Procedure: INSERTION OF ARTERIOVENOUS (AV) GORE-TEX GRAFT left  ARM;  Surgeon: Serafina Mitchell, MD;  Location: [facility] [facility];  Service: Vascular;  Laterality: Left;  . CATARACT EXTRACTION W/ INTRAOCULAR LENS IMPLANT Bilateral   . CESAREAN SECTION    . CHOLECYSTECTOMY N/A 05/25/2013   Procedure: LAPAROSCOPIC CHOLECYSTECTOMY;  Surgeon: Jamesetta So, MD;  Location: [facility];  Service: General;  Laterality: N/A;  . COLONOSCOPY WITH PROPOFOL N/A 10/02/2016   two 3 to 5 mm polyps in the descending colon, three 2 to 3 mm polyps in the rectum, random colon biopsies, rectal bleeding due to internal hemorrhoids, friability with no bleeding at the anus status post biopsy.  Surgical pathology found the polyps to be a mix of hyperplastic and tubular adenoma, random colon biopsies to be benign colonic mucosa, and the anal biopsies to be anal skin tag.   Marland Kitchen ESOPHAGOGASTRODUODENOSCOPY (EGD) WITH PROPOFOL N/A 10/02/2016   mucosal nodule in the esophagus status post biopsy, moderate gastritis status post biopsy, mild duodenitis. esophageal biopsy to be benign, gastric biopsies to be gastritis due to aspirin use, and duodenal biopsies to be duodenitis due to aspirin use  . EYE SURGERY Bilateral   . IR FLUORO GUIDE CV  LINE RIGHT  10/09/2019  . IR THROMBECTOMY AV FISTULA W/THROMBOLYSIS/PTA INC/SHUNT/IMG RIGHT Right 12/26/2018  . IR THROMBECTOMY AV FISTULA W/THROMBOLYSIS/PTA INC/SHUNT/IMG RIGHT Right 08/10/2019  . IR THROMBECTOMY AV FISTULA W/THROMBOLYSIS/PTA/STENT INC/SHUNT/IMG RT Right 08/08/2018  . IR THROMBECTOMY AV FISTULA W/THROMBOLYSIS/PTA/STENT INC/SHUNT/IMG RT Right 05/15/2019  . IR US GUIDE VASC ACCESS RIGHT  08/08/2018  . IR US GUIDE VASC ACCESS RIGHT  12/26/2018  . IR US GUIDE VASC ACCESS RIGHT  05/15/2019  . IR US GUIDE VASC ACCESS RIGHT  08/10/2019  . IR US GUIDE VASC ACCESS RIGHT  10/09/2019  . PARS PLANA VITRECTOMY Left 11/24/2014   Procedure: PARS PLANA VITRECTOMY WITH 25 GAUGE;  Surgeon: Hurman Horn, MD;  Location: [facility];  Service: Ophthalmology;  Laterality: Left;  . PERIPHERAL VASCULAR BALLOON ANGIOPLASTY Right 03/05/2018   Procedure: PERIPHERAL VASCULAR BALLOON ANGIOPLASTY;  Surgeon: Waynetta Sandy, MD;  Location: [facility];  Service: Cardiovascular;  Laterality: Right;  arm fistula  . PERIPHERAL VASCULAR CATHETERIZATION N/A 04/28/2015   Procedure: Bilateral Upper Extremity Venography;  Surgeon: Conrad Rush Center, MD;  Location: [facility] LAB;  Service: Cardiovascular;  Laterality: N/A;  . PHOTOCOAGULATION WITH LASER Left 11/24/2014   Procedure: PHOTOCOAGULATION WITH LASER;  Surgeon: Hurman Horn, MD;  Location: [facility];  Service: Ophthalmology;  Laterality: Left;  with insertion of silicone oil  . SAVORY DILATION N/A 10/02/2016   Procedure: SAVORY DILATION;  Surgeon: Danie Binder, MD;  [facility];  Service: Endoscopy;  Laterality: N/A;  . TUBAL LIGATION    . UPPER EXTREMITY VENOGRAPHY Bilateral 11/02/2019   Procedure: UPPER EXTREMITY VENOGRAPHY;  Surgeon: Waynetta Sandy, MD;  Location: [facility];  Service: Cardiovascular;  Laterality: Bilateral;    FAMILY HISTORY Family History  Problem Relation Age of Onset  . COPD Mother   . Cancer Father     . Lymphoma Father   . Diabetes Sister   . Deep vein thrombosis Sister   . Diabetes Brother   . Hyperlipidemia Brother   . Hypertension Brother   . Mental retardation Sister   . Alcohol abuse Paternal Grandmother   . Colon cancer Neg Hx   . Liver disease Neg Hx  SOCIAL HISTORY Social History   Tobacco Use  . Smoking status: Current Every Day Smoker    Packs/day: 1.00    Years: 23.00    Pack years: 23.00    Types: Cigarettes    Start date: 12/03/2000  . Smokeless tobacco: Never Used  . Tobacco comment: one pack daily  Vaping Use  . Vaping Use: Never used  Substance Use Topics  . Alcohol use: No    Alcohol/week: 0.0 standard drinks  . Drug use: No    Comment: Sober for 8 years         OPHTHALMIC EXAM:  Base Eye Exam    Visual Acuity (ETDRS)      Right Left   Dist [place] 20/100 -2 NLP   Dist ph [place] 20/80 -1 NI       Tonometry (Tonopen, 1:57 PM)      Right Left   Pressure 18 52       Pupils      Dark Light Shape React APD   Right 4 3 Round Brisk None   Left 7 7 Round Dilated None       Visual Fields (Counting fingers)      Left Right   Restrictions Total superior temporal, inferior temporal, superior nasal, inferior nasal deficiencies Total superior temporal, inferior temporal, superior nasal, inferior nasal deficiencies       Extraocular Movement      Right Left    Full Full       Neuro/Psych    Oriented x3: Yes   Mood/Affect: Normal       Dilation    Right eye: 1.0% Mydriacyl, 2.5% Phenylephrine @ 1:57 PM        Slit Lamp and Fundus Exam    Slit Lamp Exam      Right Left   Lids/Lashes Normal Normal   Conjunctiva/Sclera White and quiet White and quiet   Cornea Clear Clear   Anterior Chamber Deep and quiet Deep and quiet, glaucoma tube AC   Iris Round and reactive Old and active NVI   Lens Posterior chamber intraocular lens Posterior chamber intraocular lens   Anterior Vitreous Dear Silicone       Fundus Exam      Right Left    Posterior Vitreous Vitrectomized yet    Disc Old fibrovascular disease of the nerve White nerve   C/D Ratio 0.3 Atrophic   Macula no macular thickening, Microaneurysms, no exudates, Focal laser scars Attached   Vessels PDR-quiet    Periphery Laser scars,           IMAGING AND PROCEDURES  Imaging and Procedures for 01/13/20  OCT, Retina - OU - Both Eyes       Right Eye Quality was good. Scan locations included subfoveal. Central Foveal Thickness: 280. Progression has been stable. Findings include abnormal foveal contour.   Left Eye Quality was good. Scan locations included subfoveal. Central Foveal Thickness: 192. Progression has been stable. Findings include abnormal foveal contour, outer retinal atrophy, central retinal atrophy, inner retinal atrophy.   Notes No active clinically significant MAC edema OD  OS with diffuse retinal atrophy stable       Intravitreal Injection, Pharmacologic Agent - OD - Right Eye       Time Out 01/13/2020. 2:57 PM. Confirmed correct patient, procedure, site, and patient consented.   Anesthesia Topical anesthesia was used. Anesthetic medications included [place] 3.5%.   Procedure Preparation included Tobramycin 0.3%, 10% betadine to eyelids. A 30 gauge needle  was used.   Injection:  1.25 mg Bevacizumab (AVASTIN) SOLN   NDC: [address]-1, Lot: [id number]   Route: Intravitreal, Site: Right Eye, Waste: 0 mg  Post-op Post injection exam found visual acuity of at least counting fingers. The patient tolerated the procedure well. There were no complications. The patient received written and verbal post procedure care education. Post injection medications were not given.                 ASSESSMENT/PLAN:  Vitreous hemorrhage of right eye (HCC) Condition is now cleared on intravitreal Avastin  Diabetic macular edema of right eye with proliferative retinopathy associated with type 2 diabetes mellitus ([place]) Repeat intravitreal Avastin today  for multiple recurrences of his vitreous hemorrhage and macular disease.  Follow-up in 3 months      ICD-10-CM   1. Diabetic macular edema of right eye with proliferative retinopathy associated with type 2 diabetes mellitus (HCC)  E11.3511 OCT, Retina - OU - Both Eyes    Intravitreal Injection, Pharmacologic Agent - OD - Right Eye    Bevacizumab (AVASTIN) SOLN 1.25 mg  2. Rubeosis iridis of right eye  H21.1X1   3. Vitreous hemorrhage of right eye (HCC)  H43.11     1.  Patient will continue topical medications for lowering intraocular pressure Combigan 1 drop OU twice daily  2.  3.  Ophthalmic Meds Ordered this visit:  Meds ordered this encounter  Medications  . Bevacizumab (AVASTIN) SOLN 1.25 mg       Return in about 3 months (around 04/14/2020) for DILATE OU, AVASTIN OCT, OD.  There are no Patient Instructions on file for this visit.   Explained the diagnoses, plan, and follow up with the patient and they expressed understanding.  Patient expressed understanding of the importance of proper follow up care.   Clent Demark Ellory Khurana M.D. Diseases & Surgery of the Retina and Vitreous Retina & Diabetic [place] 01/13/20     Abbreviations: M myopia (nearsighted); A astigmatism; H hyperopia (farsighted); P presbyopia; Mrx spectacle prescription;  CTL contact lenses; OD right eye; OS left eye; OU both eyes  XT exotropia; ET esotropia; PEK punctate epithelial keratitis; PEE punctate epithelial erosions; DES dry eye syndrome; MGD meibomian gland dysfunction; ATs artificial tears; PFAT's preservative free artificial tears; [place] nuclear sclerotic cataract; PSC posterior subcapsular cataract; ERM epi-retinal membrane; PVD posterior vitreous detachment; RD retinal detachment; DM diabetes mellitus; DR diabetic retinopathy; NPDR non-proliferative diabetic retinopathy; PDR proliferative diabetic retinopathy; CSME clinically significant macular edema; DME diabetic macular edema; dbh dot blot  hemorrhages; CWS cotton wool spot; POAG primary open angle glaucoma; C/D cup-to-disc ratio; HVF humphrey visual field; GVF goldmann visual field; OCT optical coherence tomography; IOP intraocular pressure; BRVO Branch retinal vein occlusion; CRVO central retinal vein occlusion; CRAO central retinal artery occlusion; BRAO branch retinal artery occlusion; RT retinal tear; SB scleral buckle; PPV pars plana vitrectomy; VH Vitreous hemorrhage; PRP panretinal laser photocoagulation; IVK intravitreal kenalog; VMT vitreomacular traction; MH Macular hole;  NVD neovascularization of the disc; NVE neovascularization elsewhere; AREDS age related eye disease study; ARMD age related macular degeneration; POAG primary open angle glaucoma; EBMD epithelial/anterior basement membrane dystrophy; ACIOL anterior chamber intraocular lens; IOL intraocular lens; PCIOL posterior chamber intraocular lens; Phaco/IOL phacoemulsification with intraocular lens placement; [place] photorefractive keratectomy; LASIK laser assisted in situ keratomileusis; HTN hypertension; DM diabetes mellitus; COPD chronic obstructive pulmonary disease

## 2020-01-13 NOTE — Assessment & Plan Note (Signed)
Condition is now cleared on intravitreal Avastin

## 2020-01-13 NOTE — Assessment & Plan Note (Signed)
Repeat intravitreal Avastin today for multiple recurrences of his vitreous hemorrhage and macular disease.  Follow-up in 3 months

## 2020-01-14 ENCOUNTER — Ambulatory Visit (INDEPENDENT_AMBULATORY_CARE_PROVIDER_SITE_OTHER): Payer: Medicare Other | Admitting: Clinical

## 2020-01-14 DIAGNOSIS — F3341 Major depressive disorder, recurrent, in partial remission: Secondary | ICD-10-CM | POA: Diagnosis not present

## 2020-01-14 DIAGNOSIS — J439 Emphysema, unspecified: Secondary | ICD-10-CM | POA: Diagnosis not present

## 2020-01-14 DIAGNOSIS — J441 Chronic obstructive pulmonary disease with (acute) exacerbation: Secondary | ICD-10-CM | POA: Diagnosis not present

## 2020-01-14 DIAGNOSIS — Z299 Encounter for prophylactic measures, unspecified: Secondary | ICD-10-CM | POA: Diagnosis not present

## 2020-01-14 DIAGNOSIS — F319 Bipolar disorder, unspecified: Secondary | ICD-10-CM | POA: Diagnosis not present

## 2020-01-14 DIAGNOSIS — E1165 Type 2 diabetes mellitus with hyperglycemia: Secondary | ICD-10-CM | POA: Diagnosis not present

## 2020-01-14 NOTE — Progress Notes (Signed)
  Virtual Visit via Video Note  I connected withViolet A Berrieron 06/17/21at 4:00 PM EDTby a video enabled telemedicine application and verified that I am speaking with the correct person using two identifiers.  Location: Patient:Home Provider:Office  I discussed the limitations of evaluation and management by telemedicine and the availability of in person appointments. The patient expressed understanding and agreed to proceed.     THERAPIST PROGRESS NOTE  Session Time:4:00PM-4:30PM  Participation Level:Active  Behavioral Response:CasualAlertDepressed  Type of Therapy:Individual Therapy  Treatment Goals addressed:Coping  Interventions:CBT  Summary:Kara A Berrieris a 49 y.o.femalewho presents with Depression.The OPT therapist worked with thepatientfor herongoing OPT treatmentsession. The OPT therapist utilized Motivational Interviewing to assist in creating therapeutic repore. The patient in the session was engaged and work in Science writer about hertriggers and symptoms over the past few weeksincludingadjusting to recently moving, getting her heart cathter taken out, and interactions with her family and long time off and on again boyfriend. The OPT therapist utilized Cognitive Behavioral Therapy through cognitive restructuring as well as worked with the patient on coping strategies to assist in management ofmood. The patient reported the she feels her move has been very helpful and overall she is happy with the transition.  Suicidal/Homicidal:Nowithout intent/plan  Therapist Response:The OPT therapist worked with the patient for the patients scheduled session. The patient was engaged in his session and gave feedback in relation to triggers, symptoms, and behavior responses over the pastfewweeks. The OPT therapist worked with the patient utilizing an in session Cognitive Behavioral Therapy exercise. The patient was  responsive in the session and verbalized, " My mood has been good and I am feeling better, my interaction with my family has gotten better since I moved out on my own". The OPT therapist will continue treatment work with the patient in hernext scheduled session  Plan: Return again in3weeks.  Diagnosis:Axis I:MDD (major depressive disorder), recurrent, in partial remission   Axis II:No diagnosis  I discussed the assessment and treatment plan with the patient. The patient was provided an opportunity to ask questions and all were answered. The patient agreed with the plan and demonstrated an understanding of the instructions.  The patient was advised to call back or seek an in-person evaluation if the symptoms worsen or if the condition fails to improve as anticipated.  I provided52mnutes of non-face-to-face time during this encounter.  TLennox Grumbles LCSW 01/14/2020

## 2020-01-15 ENCOUNTER — Ambulatory Visit (HOSPITAL_COMMUNITY)
Admission: RE | Admit: 2020-01-15 | Discharge: 2020-01-15 | Disposition: A | Discharge status: Home / Self Care | Payer: Medicare Other | Source: Ambulatory Visit | Attending: Nephrology | Admitting: Nephrology

## 2020-01-15 ENCOUNTER — Emergency Department (HOSPITAL_COMMUNITY): Payer: Medicare Other

## 2020-01-15 ENCOUNTER — Emergency Department (HOSPITAL_COMMUNITY)
Admission: EM | Admit: 2020-01-15 | Discharge: 2020-01-15 | Disposition: A | Discharge status: Home / Self Care | Payer: Medicare Other | Source: Home / Self Care | Attending: Emergency Medicine | Admitting: Emergency Medicine

## 2020-01-15 ENCOUNTER — Emergency Department (HOSPITAL_COMMUNITY)
Admission: EM | Admit: 2020-01-15 | Discharge: 2020-01-15 | Disposition: A | Discharge status: Home / Self Care | Payer: Medicare Other | Attending: Emergency Medicine | Admitting: Emergency Medicine

## 2020-01-15 ENCOUNTER — Other Ambulatory Visit: Payer: Self-pay

## 2020-01-15 DIAGNOSIS — Z992 Dependence on renal dialysis: Secondary | ICD-10-CM | POA: Insufficient documentation

## 2020-01-15 DIAGNOSIS — E1122 Type 2 diabetes mellitus with diabetic chronic kidney disease: Secondary | ICD-10-CM | POA: Insufficient documentation

## 2020-01-15 DIAGNOSIS — Y999 Unspecified external cause status: Secondary | ICD-10-CM | POA: Insufficient documentation

## 2020-01-15 DIAGNOSIS — N186 End stage renal disease: Secondary | ICD-10-CM

## 2020-01-15 DIAGNOSIS — Z794 Long term (current) use of insulin: Secondary | ICD-10-CM | POA: Insufficient documentation

## 2020-01-15 DIAGNOSIS — Y9389 Activity, other specified: Secondary | ICD-10-CM | POA: Insufficient documentation

## 2020-01-15 DIAGNOSIS — W06XXXA Fall from bed, initial encounter: Secondary | ICD-10-CM | POA: Insufficient documentation

## 2020-01-15 DIAGNOSIS — I110 Hypertensive heart disease with heart failure: Secondary | ICD-10-CM | POA: Insufficient documentation

## 2020-01-15 DIAGNOSIS — F17210 Nicotine dependence, cigarettes, uncomplicated: Secondary | ICD-10-CM | POA: Insufficient documentation

## 2020-01-15 DIAGNOSIS — S0990XA Unspecified injury of head, initial encounter: Secondary | ICD-10-CM | POA: Insufficient documentation

## 2020-01-15 DIAGNOSIS — I5032 Chronic diastolic (congestive) heart failure: Secondary | ICD-10-CM | POA: Insufficient documentation

## 2020-01-15 DIAGNOSIS — R519 Headache, unspecified: Secondary | ICD-10-CM | POA: Insufficient documentation

## 2020-01-15 DIAGNOSIS — Z79899 Other long term (current) drug therapy: Secondary | ICD-10-CM | POA: Insufficient documentation

## 2020-01-15 DIAGNOSIS — J45909 Unspecified asthma, uncomplicated: Secondary | ICD-10-CM | POA: Insufficient documentation

## 2020-01-15 DIAGNOSIS — Y929 Unspecified place or not applicable: Secondary | ICD-10-CM | POA: Insufficient documentation

## 2020-01-15 DIAGNOSIS — Z7901 Long term (current) use of anticoagulants: Secondary | ICD-10-CM | POA: Insufficient documentation

## 2020-01-15 DIAGNOSIS — I132 Hypertensive heart and chronic kidney disease with heart failure and with stage 5 chronic kidney disease, or end stage renal disease: Secondary | ICD-10-CM | POA: Insufficient documentation

## 2020-01-15 DIAGNOSIS — Y939 Activity, unspecified: Secondary | ICD-10-CM | POA: Insufficient documentation

## 2020-01-15 DIAGNOSIS — J449 Chronic obstructive pulmonary disease, unspecified: Secondary | ICD-10-CM | POA: Insufficient documentation

## 2020-01-15 DIAGNOSIS — Z87891 Personal history of nicotine dependence: Secondary | ICD-10-CM | POA: Insufficient documentation

## 2020-01-15 HISTORY — PX: IR REMOVAL TUN CV CATH W/O FL: IMG2289

## 2020-01-15 MED ORDER — CHLORHEXIDINE GLUCONATE 4 % EX LIQD
CUTANEOUS | Status: AC
  Filled 2020-01-15: qty 15

## 2020-01-15 MED ORDER — ACETAMINOPHEN 325 MG PO TABS
650.0000 mg | ORAL_TABLET | Freq: Once | ORAL | Status: AC
  Administered 2020-01-15: 650 mg via ORAL
  Filled 2020-01-15: qty 2

## 2020-01-15 MED ORDER — LIDOCAINE HCL 1 % IJ SOLN
INTRAMUSCULAR | Status: AC
  Filled 2020-01-15: qty 20

## 2020-01-15 NOTE — ED Notes (Signed)
Pt stated they were leaving  

## 2020-01-15 NOTE — ED Triage Notes (Signed)
Pt fell out of bed and hit head. Denies LOC. Is not on blood thinners.

## 2020-01-15 NOTE — Discharge Instructions (Addendum)
Take Tylenol for pain and follow-up with your doctor as needed

## 2020-01-15 NOTE — Procedures (Signed)
Pre procedural Dx: ESRD Post procedural Dx: Same  Successful removal of tunneled right HD catheter. Catheter and cuff removed in tact.  EBL: None No immediate complications.  Hemostasis achieved with manual pressure. 4x4 gauze + tegaderm applied - dressing to remain x 24H, do not submerge until fully healed.   Please see imaging section of Epic for full dictation.  Joaquim Nam PA-C 01/15/2020 11:49 AM

## 2020-01-15 NOTE — ED Triage Notes (Signed)
Pt states that she rolled out of the bed and hit her head; denies LOC.

## 2020-01-15 NOTE — ED Provider Notes (Signed)
[facility] Provider Note   CSN: [id number] Arrival date & time: 01/15/20  0759     History Chief Complaint  Patient presents with  . Fall    Kara Knapp is a 49 y.o. female.  Patient states she fell out of bed and hit her head.  No loss of consciousness.  Patient is a dialysis patient and gets dialysis tomorrow  The history is provided by the patient. No language interpreter was used.  Fall This is a new problem. The current episode started 12 to 24 hours ago. The problem occurs rarely. The problem has been resolved. Associated symptoms include headaches. Pertinent negatives include no chest pain and no abdominal pain. Nothing aggravates the symptoms. Nothing relieves the symptoms. She has tried nothing for the symptoms. The treatment provided no relief.       Past Medical History:  Diagnosis Date  . Anemia of chronic disease   . Anxiety   . Asthma   . Blind left eye   . Bronchitis   . Cataract   . Cholecystitis, acute 05/26/2013   Status post cholecystectomy  . Chronic abdominal pain   . Chronic diarrhea   . COPD (chronic obstructive pulmonary disease) ([place])   . Depression   . Diabetic foot ulcer ([place]) 03/01/2015  . Diastolic heart failure ([place])   . ESRD on hemodialysis ([place])    Started diaylsis 12/29/15  . Essential hypertension   . Fibroids   . Glaucoma   . History of blood transfusion   . History of pneumonia   . Hyperlipidemia   . Insulin-dependent diabetes mellitus with retinopathy   . Liver fibrosis    Negative Hep B surface antigen, negative Hep C antibody Feb 2018 (see scanned in labs).  . Neuropathy   . Osteomyelitis ([place])    Toe on left foot    Patient Active Problem List   Diagnosis Date Noted  . Diabetic macular edema of right eye with proliferative retinopathy associated with type 2 diabetes mellitus ([place]) 01/13/2020  . Rubeosis iridis of right eye 01/13/2020  . Vitreous hemorrhage of right eye ([place]) 01/13/2020  .  GERD (gastroesophageal reflux disease) 09/02/2019  . MDD (major depressive disorder), recurrent, in partial remission ([place]) 08/21/2019  . Elevated ferritin 05/10/2019  . MDD (major depressive disorder), recurrent episode, moderate ([place]) 05/28/2018  . Cocaine use disorder, severe, in sustained remission ([place]) 05/28/2018  . Flatulence 12/18/2017  . Mixed hyperlipidemia 11/28/2017  . Constipation 09/18/2017  . Recurrent infection of skin 08/21/2017  . Ascites 12/21/2016  . Fatty liver 11/08/2016  . Rectal bleeding 09/07/2016  . Legally blind 09/06/2016  . Hepatomegaly 05/04/2016  . Abnormal LFTs 05/04/2016  . Chronic diarrhea 05/04/2016  . Esophageal dysphagia 05/04/2016  . Mood disorder ([place]) 08/12/2015  . Fibroid, uterine 01/24/2015  . Iron deficiency anemia due to chronic blood loss 01/24/2015  . Menorrhagia with irregular cycle 01/24/2015  . Chronic diastolic CHF (congestive heart failure) ([place]) 01/21/2015  . Type 2 diabetes mellitus with ESRD (end-stage renal disease) ([place]) 01/21/2015  . Diabetic neuropathy ([place]) 01/21/2015  . ESRD on dialysis ([place]) 01/21/2015  . Current smoker 01/21/2015  . Essential hypertension, benign 05/25/2013    Past Surgical History:  Procedure Laterality Date  . A/V SHUNTOGRAM N/A 10/25/2016   Procedure: A/V Shuntogram - Right Arm;  Surgeon: Waynetta Sandy, MD;  Location: [facility];  Service: Cardiovascular;  Laterality: N/A;  . A/V SHUNTOGRAM N/A 03/05/2018   Procedure: A/V SHUNTOGRAM -  Right Arm;  Surgeon: Waynetta Sandy, MD;  Location: [facility];  Service: Cardiovascular;  Laterality: N/A;  . AV FISTULA PLACEMENT Right 10/17/2015   Procedure: INSERTION OF ARTERIOVENOUS GORE-TEX GRAFT RIGHT UPPER ARM WITH ACUSEAL;  Surgeon: Conrad Tehuacana, MD;  Location: [facility] OR;  Service: Vascular;  Laterality: Right;  . AV FISTULA PLACEMENT Left 11/18/2019   Procedure: INSERTION OF ARTERIOVENOUS (AV) GORE-TEX GRAFT left  ARM;   Surgeon: Serafina Mitchell, MD;  Location: [facility];  Service: Vascular;  Laterality: Left;  . CATARACT EXTRACTION W/ INTRAOCULAR LENS IMPLANT Bilateral   . CESAREAN SECTION    . CHOLECYSTECTOMY N/A 05/25/2013   Procedure: LAPAROSCOPIC CHOLECYSTECTOMY;  Surgeon: Jamesetta So, MD;  Location: [facility];  Service: General;  Laterality: N/A;  . COLONOSCOPY WITH PROPOFOL N/A 10/02/2016   two 3 to 5 mm polyps in the descending colon, three 2 to 3 mm polyps in the rectum, random colon biopsies, rectal bleeding due to internal hemorrhoids, friability with no bleeding at the anus status post biopsy.  Surgical pathology found the polyps to be a mix of hyperplastic and tubular adenoma, random colon biopsies to be benign colonic mucosa, and the anal biopsies to be anal skin tag.   Marland Kitchen ESOPHAGOGASTRODUODENOSCOPY (EGD) WITH PROPOFOL N/A 10/02/2016   mucosal nodule in the esophagus status post biopsy, moderate gastritis status post biopsy, mild duodenitis. esophageal biopsy to be benign, gastric biopsies to be gastritis due to aspirin use, and duodenal biopsies to be duodenitis due to aspirin use  . EYE SURGERY Bilateral   . IR FLUORO GUIDE CV LINE RIGHT  10/09/2019  . IR THROMBECTOMY AV FISTULA W/THROMBOLYSIS/PTA INC/SHUNT/IMG RIGHT Right 12/26/2018  . IR THROMBECTOMY AV FISTULA W/THROMBOLYSIS/PTA INC/SHUNT/IMG RIGHT Right 08/10/2019  . IR THROMBECTOMY AV FISTULA W/THROMBOLYSIS/PTA/STENT INC/SHUNT/IMG RT Right 08/08/2018  . IR THROMBECTOMY AV FISTULA W/THROMBOLYSIS/PTA/STENT INC/SHUNT/IMG RT Right 05/15/2019  . IR US GUIDE VASC ACCESS RIGHT  08/08/2018  . IR US GUIDE VASC ACCESS RIGHT  12/26/2018  . IR US GUIDE VASC ACCESS RIGHT  05/15/2019  . IR US GUIDE VASC ACCESS RIGHT  08/10/2019  . IR US GUIDE VASC ACCESS RIGHT  10/09/2019  . PARS PLANA VITRECTOMY Left 11/24/2014   Procedure: PARS PLANA VITRECTOMY WITH 25 GAUGE;  Surgeon: Hurman Horn, MD;  Location: [facility];  Service: Ophthalmology;  Laterality: Left;  . PERIPHERAL  VASCULAR BALLOON ANGIOPLASTY Right 03/05/2018   Procedure: PERIPHERAL VASCULAR BALLOON ANGIOPLASTY;  Surgeon: Waynetta Sandy, MD;  Location: [facility];  Service: Cardiovascular;  Laterality: Right;  arm fistula  . PERIPHERAL VASCULAR CATHETERIZATION N/A 04/28/2015   Procedure: Bilateral Upper Extremity Venography;  Surgeon: Conrad Creedmoor, MD;  Location: [facility];  Service: Cardiovascular;  Laterality: N/A;  . PHOTOCOAGULATION WITH LASER Left 11/24/2014   Procedure: PHOTOCOAGULATION WITH LASER;  Surgeon: Hurman Horn, MD;  Location: [facility];  Service: Ophthalmology;  Laterality: Left;  with insertion of silicone oil  . SAVORY DILATION N/A 10/02/2016   Procedure: SAVORY DILATION;  Surgeon: Danie Binder, MD;  [facility];  Service: Endoscopy;  Laterality: N/A;  . TUBAL LIGATION    . UPPER EXTREMITY VENOGRAPHY Bilateral 11/02/2019   Procedure: UPPER EXTREMITY VENOGRAPHY;  Surgeon: Waynetta Sandy, MD;  Location: [facility];  Service: Cardiovascular;  Laterality: Bilateral;     OB History    Gravida  5   Para  5   Term      Preterm  AB      Living        SAB      TAB      Ectopic      Multiple      Live Births              Family History  Problem Relation Age of Onset  . COPD Mother   . Cancer Father   . Lymphoma Father   . Diabetes Sister   . Deep vein thrombosis Sister   . Diabetes Brother   . Hyperlipidemia Brother   . Hypertension Brother   . Mental retardation Sister   . Alcohol abuse Paternal Grandmother   . Colon cancer Neg Hx   . Liver disease Neg Hx     Social History   Tobacco Use  . Smoking status: Current Every Day Smoker    Packs/day: 1.00    Years: 23.00    Pack years: 23.00    Types: Cigarettes    Start date: 12/03/2000  . Smokeless tobacco: Never Used  . Tobacco comment: one pack daily  Vaping Use  . Vaping Use: Never used  Substance Use Topics  . Alcohol use: No    Alcohol/week:  0.0 standard drinks  . Drug use: No    Comment: Sober for 8 years    Home Medications Prior to Admission medications   Medication Sig Start Date End Date Taking? Authorizing Provider  acetaminophen (TYLENOL) 500 MG tablet Take 1,000 mg by mouth every 6 (six) hours as needed for moderate pain or headache.    [provider]  apixaban (ELIQUIS) 2.5 MG TABS tablet Take 2.5 mg by mouth 2 (two) times daily.    [provider]  ARIPiprazole (ABILIFY) 5 MG tablet Take 1 tablet (5 mg total) by mouth daily. 12/23/19 03/22/20  [name], MD  azelastine (ASTELIN) 0.1 % nasal spray Place 1 spray into both nostrils 2 (two) times daily. Use in each nostril as directed Patient taking differently: Place 1 spray into both nostrils daily as needed for rhinitis.  08/29/18   Dettinger, Fransisca Kaufmann, MD  carvedilol (COREG) 12.5 MG tablet Take 12.5 mg by mouth 2 (two) times daily. 10/14/19   [provider]  cholecalciferol (VITAMIN D) 1000 units tablet Take 1,000 Units by mouth daily.     [provider]  Continuous Blood Gluc Sensor (FREESTYLE LIBRE 14 DAY SENSOR) MISC Inject 1 each into the skin every 14 (fourteen) days. Use as directed. 11/27/17   Cassandria Anger, MD  cyclobenzaprine (FLEXERIL) 5 MG tablet TAKE ONE TABLET BY MOUTH THREE TIMES DAILY AS NEEDED FOR MUSCLE SPASM 12/18/19   Dettinger, Fransisca Kaufmann, MD  diclofenac sodium (VOLTAREN) 1 % GEL Apply 2 g topically 4 (four) times daily. Patient taking differently: Apply 2 g topically 4 (four) times daily as needed (pain).  11/14/18   Janora Norlander, DO  gabapentin (NEURONTIN) 100 MG capsule Take 1 capsule (100 mg total) by mouth 3 (three) times daily. (Needs to be seen before next refill) 11/25/19   Dettinger, Fransisca Kaufmann, MD  glucose blood (PRODIGY NO CODING BLOOD GLUC) test strip USE TO CHECK BLOOD SUGAR TWICE DAILY. Dx E11.22 02/03/19   Dettinger, Fransisca Kaufmann, MD  ibuprofen (ADVIL) 200 MG tablet Take 600 mg by mouth every 6 (six)  hours as needed for headache or moderate pain.    [provider]  Insulin Pen Needle (TRUEPLUS PEN NEEDLES) 31G X 5 MM MISC Use daily with insulin  Dx E11.22 03/09/19   Dettinger, Fransisca Kaufmann, MD  LANTUS SOLOSTAR 100 UNIT/ML Solostar Pen INJECT 18-50 UNITS SUBCUTANEOUSLY AT BEDTIME. Patient taking differently: Inject 20 Units into the skin at bedtime.  10/01/19   Dettinger, Fransisca Kaufmann, MD  lidocaine-prilocaine (EMLA) cream Apply 1 application topically daily as needed (arm port).    [provider]  linagliptin (TRADJENTA) 5 MG TABS tablet Take 1 tablet (5 mg total) by mouth daily. Patient taking differently: Take 5 mg by mouth every evening.  03/18/19   Dettinger, Fransisca Kaufmann, MD  multivitamin (RENA-VIT) TABS tablet Take 1 tablet by mouth daily. 04/03/19   [provider]  oxyCODONE-acetaminophen (PERCOCET) 7.5-325 MG tablet Take 1 tablet by mouth every 4 (four) hours as needed for moderate pain or severe pain. Patient not taking: Reported on 01/04/2020 11/18/19 11/17/20  Vaughan Basta, Edman Circle, PA-C  pantoprazole (PROTONIX) 40 MG tablet TAKE 1 BY MOUTH DAILY 30 minutes before breakfast Patient taking differently: Take 40 mg by mouth daily.  02/02/19   Dettinger, Fransisca Kaufmann, MD  Probiotic Product (CULTURELLE PROBIOTICS PO) Take 1 capsule by mouth daily.     [provider]  PRODIGY TWIST TOP LANCETS 28G MISC USE TO CHECK BLOOD SUGAR UP TO FOUR TIMES DAILY. 03/13/17   Timmothy Euler, MD  sertraline (ZOLOFT) 100 MG tablet Take 1.5 tablets (150 mg total) by mouth daily. 01/12/20   [name], MD  sevelamer carbonate (RENVELA) 800 MG tablet Take 800-2,400 mg by mouth See admin instructions. Take 2466m by mouth three times daily with means and 8033mwith snacks    [provider]  SYMBICORT 160-4.5 MCG/ACT inhaler  12/07/19   [provider]  traZODone (DESYREL) 50 MG tablet Take 0.5-1 tablets (25-50 mg total) by mouth at bedtime as needed. for sleep Patient taking  differently: Take 25 mg by mouth at bedtime as needed for sleep.  03/18/19   Dettinger, JoFransisca KaufmannMD  vancomycin IVPB Inject into the vein. With Dialysis.    [provider]  VENTOLIN HFA 108 ([id number]) MCG/ACT inhaler  11/27/19   [provider]    Allergies    Sulfamethoxazole-trimethoprim, Bactrim [sulfamethoxazole-trimethoprim], and Prednisone  Review of Systems   Review of Systems  Constitutional: Negative for appetite change and fatigue.  HENT: Negative for congestion, ear discharge and sinus pressure.   Eyes: Negative for discharge.  Respiratory: Negative for cough.   Cardiovascular: Negative for chest pain.  Gastrointestinal: Negative for abdominal pain and diarrhea.  Genitourinary: Negative for frequency and hematuria.  Musculoskeletal: Negative for back pain.  Skin: Negative for rash.  Neurological: Positive for headaches. Negative for seizures.  Psychiatric/Behavioral: Negative for hallucinations.    Physical Exam Updated Vital Signs BP (!) 173/80 (BP Location: Right Arm)   Pulse 78   Temp 98.3 F (36.8 C) (Oral)   Resp 15   LMP 05/05/2015   SpO2 93%   Physical Exam Vitals and nursing note reviewed.  Constitutional:      Appearance: She is well-developed.  HENT:     Head: Normocephalic.     Comments: Tender left forehead    Nose: Nose normal.  Eyes:     General: No scleral icterus.    Conjunctiva/sclera: Conjunctivae normal.     Comments: Patient has irregular pupils but stated she is blind  Neck:     Thyroid: No thyromegaly.  Cardiovascular:     Rate and Rhythm: Normal rate and regular rhythm.     Heart sounds: No murmur  heard.  No friction rub. No gallop.   Pulmonary:     Breath sounds: No stridor. No wheezing or rales.  Chest:     Chest wall: No tenderness.  Abdominal:     General: There is no distension.     Tenderness: There is no abdominal tenderness. There is no rebound.  Musculoskeletal:        General: Normal range of  motion.     Cervical back: Neck supple.  Lymphadenopathy:     Cervical: No cervical adenopathy.  Skin:    Findings: No erythema or rash.  Neurological:     Mental Status: She is alert and oriented to person, place, and time.     Motor: No abnormal muscle tone.     Coordination: Coordination normal.  Psychiatric:        Behavior: Behavior normal.     ED Results / Procedures / Treatments   Labs (all labs ordered are listed, but only abnormal results are displayed) Labs Reviewed - No data to display  EKG None  Radiology Intravitreal Injection, Pharmacologic Agent - OD - Right Eye  Result Date: 01/13/2020 Time Out 01/13/2020. 2:57 PM. Confirmed correct patient, procedure, site, and patient consented. Anesthesia Topical anesthesia was used. Anesthetic medications included Akten 3.5%. Procedure Preparation included Tobramycin 0.3%, 10% betadine to eyelids. A 30 gauge needle was used. Injection: 1.25 mg Bevacizumab (AVASTIN) SOLN   NDC: [address]-3, Lot: [id number]   Route: Intravitreal, Site: Right Eye, Waste: 0 mg Post-op Post injection exam found visual acuity of at least counting fingers. The patient tolerated the procedure well. There were no complications. The patient received written and verbal post procedure care education. Post injection medications were not given.   OCT, Retina - OU - Both Eyes  Result Date: 01/13/2020 Right Eye Quality was good. Scan locations included subfoveal. Central Foveal Thickness: 280. Progression has been stable. Findings include abnormal foveal contour. Left Eye Quality was good. Scan locations included subfoveal. Central Foveal Thickness: 192. Progression has been stable. Findings include abnormal foveal contour, outer retinal atrophy, central retinal atrophy, inner retinal atrophy. Notes No active clinically significant MAC edema OD OS with diffuse retinal atrophy stable   Procedures Procedures (including critical care time)  Medications Ordered in  ED Medications - No data to display  ED Course  I have reviewed the triage vital signs and the nursing notes.  Pertinent labs & imaging results that were available during my care of the patient were reviewed by me and considered in my medical decision making (see chart for details).    MDM Rules/Calculators/A&P                          Patient with a fall and a head injury but no significant problems.  She will take Tylenol for pain.  CT scan negative        This patient presents to the ED for concern of fall this involves an extensive number of treatment options, and is a complaint that carries with it a high risk of complications and morbidity.  The differential diagnosis includes head injury   Lab Tests:     Medicines ordered:  I ordered medication Tylenol for pain Imaging Studies ordered:   I ordered imaging studies which included CT of the head and  I independently visualized and interpreted imaging which showed unremarkable  Additional history obtained:   Additional history obtained from friend  Previous records obtained and reviewed  Consultations  Obtained:   Reevaluation:  After the interventions stated above, I reevaluated the patient and found unchanged  Critical Interventions:  .   Final Clinical Impression(s) / ED Diagnoses Final diagnoses:  None    Rx / DC Orders ED Discharge Orders    None       Milton Ferguson, MD 01/15/20 [id number]

## 2020-01-16 DIAGNOSIS — T827XXA Infection and inflammatory reaction due to other cardiac and vascular devices, implants and grafts, initial encounter: Secondary | ICD-10-CM | POA: Diagnosis not present

## 2020-01-16 DIAGNOSIS — D631 Anemia in chronic kidney disease: Secondary | ICD-10-CM | POA: Diagnosis not present

## 2020-01-16 DIAGNOSIS — Z992 Dependence on renal dialysis: Secondary | ICD-10-CM | POA: Diagnosis not present

## 2020-01-16 DIAGNOSIS — D509 Iron deficiency anemia, unspecified: Secondary | ICD-10-CM | POA: Diagnosis not present

## 2020-01-16 DIAGNOSIS — N186 End stage renal disease: Secondary | ICD-10-CM | POA: Diagnosis not present

## 2020-01-16 DIAGNOSIS — N250 Renal osteodystrophy: Secondary | ICD-10-CM | POA: Diagnosis not present

## 2020-01-19 DIAGNOSIS — T827XXA Infection and inflammatory reaction due to other cardiac and vascular devices, implants and grafts, initial encounter: Secondary | ICD-10-CM | POA: Diagnosis not present

## 2020-01-19 DIAGNOSIS — N250 Renal osteodystrophy: Secondary | ICD-10-CM | POA: Diagnosis not present

## 2020-01-19 DIAGNOSIS — D631 Anemia in chronic kidney disease: Secondary | ICD-10-CM | POA: Diagnosis not present

## 2020-01-19 DIAGNOSIS — Z992 Dependence on renal dialysis: Secondary | ICD-10-CM | POA: Diagnosis not present

## 2020-01-19 DIAGNOSIS — N186 End stage renal disease: Secondary | ICD-10-CM | POA: Diagnosis not present

## 2020-01-19 DIAGNOSIS — D509 Iron deficiency anemia, unspecified: Secondary | ICD-10-CM | POA: Diagnosis not present

## 2020-01-20 DIAGNOSIS — Z299 Encounter for prophylactic measures, unspecified: Secondary | ICD-10-CM | POA: Diagnosis not present

## 2020-01-20 DIAGNOSIS — Z1331 Encounter for screening for depression: Secondary | ICD-10-CM | POA: Diagnosis not present

## 2020-01-20 DIAGNOSIS — E1122 Type 2 diabetes mellitus with diabetic chronic kidney disease: Secondary | ICD-10-CM | POA: Diagnosis not present

## 2020-01-20 DIAGNOSIS — Z1339 Encounter for screening examination for other mental health and behavioral disorders: Secondary | ICD-10-CM | POA: Diagnosis not present

## 2020-01-20 DIAGNOSIS — Z6833 Body mass index (BMI) 33.0-33.9, adult: Secondary | ICD-10-CM | POA: Diagnosis not present

## 2020-01-20 DIAGNOSIS — Z0000 Encounter for general adult medical examination without abnormal findings: Secondary | ICD-10-CM | POA: Diagnosis not present

## 2020-01-20 DIAGNOSIS — Z7189 Other specified counseling: Secondary | ICD-10-CM | POA: Diagnosis not present

## 2020-01-20 DIAGNOSIS — I10 Essential (primary) hypertension: Secondary | ICD-10-CM | POA: Diagnosis not present

## 2020-01-20 DIAGNOSIS — N186 End stage renal disease: Secondary | ICD-10-CM | POA: Diagnosis not present

## 2020-01-20 DIAGNOSIS — E1165 Type 2 diabetes mellitus with hyperglycemia: Secondary | ICD-10-CM | POA: Diagnosis not present

## 2020-01-20 DIAGNOSIS — Z992 Dependence on renal dialysis: Secondary | ICD-10-CM | POA: Diagnosis not present

## 2020-01-21 DIAGNOSIS — D509 Iron deficiency anemia, unspecified: Secondary | ICD-10-CM | POA: Diagnosis not present

## 2020-01-21 DIAGNOSIS — N186 End stage renal disease: Secondary | ICD-10-CM | POA: Diagnosis not present

## 2020-01-21 DIAGNOSIS — N250 Renal osteodystrophy: Secondary | ICD-10-CM | POA: Diagnosis not present

## 2020-01-21 DIAGNOSIS — Z992 Dependence on renal dialysis: Secondary | ICD-10-CM | POA: Diagnosis not present

## 2020-01-21 DIAGNOSIS — D631 Anemia in chronic kidney disease: Secondary | ICD-10-CM | POA: Diagnosis not present

## 2020-01-21 DIAGNOSIS — T827XXA Infection and inflammatory reaction due to other cardiac and vascular devices, implants and grafts, initial encounter: Secondary | ICD-10-CM | POA: Diagnosis not present

## 2020-01-23 DIAGNOSIS — T827XXA Infection and inflammatory reaction due to other cardiac and vascular devices, implants and grafts, initial encounter: Secondary | ICD-10-CM | POA: Diagnosis not present

## 2020-01-23 DIAGNOSIS — D631 Anemia in chronic kidney disease: Secondary | ICD-10-CM | POA: Diagnosis not present

## 2020-01-23 DIAGNOSIS — N250 Renal osteodystrophy: Secondary | ICD-10-CM | POA: Diagnosis not present

## 2020-01-23 DIAGNOSIS — Z992 Dependence on renal dialysis: Secondary | ICD-10-CM | POA: Diagnosis not present

## 2020-01-23 DIAGNOSIS — N186 End stage renal disease: Secondary | ICD-10-CM | POA: Diagnosis not present

## 2020-01-23 DIAGNOSIS — D509 Iron deficiency anemia, unspecified: Secondary | ICD-10-CM | POA: Diagnosis not present

## 2020-01-26 DIAGNOSIS — T827XXA Infection and inflammatory reaction due to other cardiac and vascular devices, implants and grafts, initial encounter: Secondary | ICD-10-CM | POA: Diagnosis not present

## 2020-01-26 DIAGNOSIS — D509 Iron deficiency anemia, unspecified: Secondary | ICD-10-CM | POA: Diagnosis not present

## 2020-01-26 DIAGNOSIS — N250 Renal osteodystrophy: Secondary | ICD-10-CM | POA: Diagnosis not present

## 2020-01-26 DIAGNOSIS — N186 End stage renal disease: Secondary | ICD-10-CM | POA: Diagnosis not present

## 2020-01-26 DIAGNOSIS — Z992 Dependence on renal dialysis: Secondary | ICD-10-CM | POA: Diagnosis not present

## 2020-01-26 DIAGNOSIS — D631 Anemia in chronic kidney disease: Secondary | ICD-10-CM | POA: Diagnosis not present

## 2020-01-27 DIAGNOSIS — E119 Type 2 diabetes mellitus without complications: Secondary | ICD-10-CM | POA: Diagnosis not present

## 2020-01-27 DIAGNOSIS — J449 Chronic obstructive pulmonary disease, unspecified: Secondary | ICD-10-CM | POA: Diagnosis not present

## 2020-01-28 DIAGNOSIS — D631 Anemia in chronic kidney disease: Secondary | ICD-10-CM | POA: Diagnosis not present

## 2020-01-28 DIAGNOSIS — N250 Renal osteodystrophy: Secondary | ICD-10-CM | POA: Diagnosis not present

## 2020-01-28 DIAGNOSIS — N186 End stage renal disease: Secondary | ICD-10-CM | POA: Diagnosis not present

## 2020-01-28 DIAGNOSIS — Z992 Dependence on renal dialysis: Secondary | ICD-10-CM | POA: Diagnosis not present

## 2020-01-30 DIAGNOSIS — D631 Anemia in chronic kidney disease: Secondary | ICD-10-CM | POA: Diagnosis not present

## 2020-01-30 DIAGNOSIS — N250 Renal osteodystrophy: Secondary | ICD-10-CM | POA: Diagnosis not present

## 2020-01-30 DIAGNOSIS — N186 End stage renal disease: Secondary | ICD-10-CM | POA: Diagnosis not present

## 2020-01-30 DIAGNOSIS — Z992 Dependence on renal dialysis: Secondary | ICD-10-CM | POA: Diagnosis not present

## 2020-02-02 DIAGNOSIS — Z992 Dependence on renal dialysis: Secondary | ICD-10-CM | POA: Diagnosis not present

## 2020-02-02 DIAGNOSIS — D631 Anemia in chronic kidney disease: Secondary | ICD-10-CM | POA: Diagnosis not present

## 2020-02-02 DIAGNOSIS — N250 Renal osteodystrophy: Secondary | ICD-10-CM | POA: Diagnosis not present

## 2020-02-02 DIAGNOSIS — N186 End stage renal disease: Secondary | ICD-10-CM | POA: Diagnosis not present

## 2020-02-03 ENCOUNTER — Ambulatory Visit (HOSPITAL_COMMUNITY): Payer: Medicare Other | Admitting: Clinical

## 2020-02-03 ENCOUNTER — Telehealth (HOSPITAL_COMMUNITY): Payer: Self-pay | Admitting: Clinical

## 2020-02-03 NOTE — Telephone Encounter (Signed)
The OPT therapist attempted text to session at 2:00PM and 2:10PM, however, the patient did not respond missing their scheduled session

## 2020-02-06 DIAGNOSIS — D631 Anemia in chronic kidney disease: Secondary | ICD-10-CM | POA: Diagnosis not present

## 2020-02-06 DIAGNOSIS — N250 Renal osteodystrophy: Secondary | ICD-10-CM | POA: Diagnosis not present

## 2020-02-06 DIAGNOSIS — N186 End stage renal disease: Secondary | ICD-10-CM | POA: Diagnosis not present

## 2020-02-06 DIAGNOSIS — Z992 Dependence on renal dialysis: Secondary | ICD-10-CM | POA: Diagnosis not present

## 2020-02-09 DIAGNOSIS — N186 End stage renal disease: Secondary | ICD-10-CM | POA: Diagnosis not present

## 2020-02-09 DIAGNOSIS — Z992 Dependence on renal dialysis: Secondary | ICD-10-CM | POA: Diagnosis not present

## 2020-02-09 DIAGNOSIS — D631 Anemia in chronic kidney disease: Secondary | ICD-10-CM | POA: Diagnosis not present

## 2020-02-09 DIAGNOSIS — N250 Renal osteodystrophy: Secondary | ICD-10-CM | POA: Diagnosis not present

## 2020-02-11 DIAGNOSIS — N186 End stage renal disease: Secondary | ICD-10-CM | POA: Diagnosis not present

## 2020-02-11 DIAGNOSIS — Z992 Dependence on renal dialysis: Secondary | ICD-10-CM | POA: Diagnosis not present

## 2020-02-11 DIAGNOSIS — N250 Renal osteodystrophy: Secondary | ICD-10-CM | POA: Diagnosis not present

## 2020-02-11 DIAGNOSIS — D631 Anemia in chronic kidney disease: Secondary | ICD-10-CM | POA: Diagnosis not present

## 2020-02-12 ENCOUNTER — Ambulatory Visit (INDEPENDENT_AMBULATORY_CARE_PROVIDER_SITE_OTHER): Payer: Medicare Other | Admitting: Clinical

## 2020-02-12 ENCOUNTER — Other Ambulatory Visit: Payer: Self-pay

## 2020-02-12 DIAGNOSIS — F3341 Major depressive disorder, recurrent, in partial remission: Secondary | ICD-10-CM | POA: Diagnosis not present

## 2020-02-12 NOTE — Progress Notes (Signed)
Virtual Visit via Telephone Note  I connected with Kara Knapp on 02/12/20 at 11:00 AM EDT by telephone and verified that I am speaking with the correct person using two identifiers.  Location: Patient:Home Provider:Office  I discussed the limitations of evaluation and management by telemedicine and the availability of in person appointments. The patient expressed understanding and agreed to proceed.     THERAPIST PROGRESS NOTE  Session Time:11:00AM-11:30AM  Participation Level:Active  Behavioral Response:CasualAlertDepressed  Type of Therapy:Individual Therapy  Treatment Goals addressed:Coping  Interventions:CBT  Summary:Kara A Berrieris a 49 y.o.femalewho presents with Depression.The OPT therapist worked with thepatientfor herongoing OPT treatmentsession. The OPT therapist utilized Motivational Interviewing to assist in creating therapeutic repore. The patient in the session was engaged and work in Science writer about hertriggers and symptoms over the past few weeksincluding ongoingadjustment in now living alone, health concerns, and family interaction. The OPT therapist utilized Cognitive Behavioral Therapy through cognitive restructuring as well as worked with the patient on coping strategies to assist in management ofmood.The patient reported the she continues to work to adjust, symptoms have been improving, and is looking forward to her sister arriving later in the day who would be staying with her over the next few days.  Suicidal/Homicidal:Nowithout intent/plan  Therapist Response:The OPT therapist worked with the patient for the patients scheduled session. The patient was engaged in his session and gave feedback in relation to triggers, symptoms, and behavior responses over the pastfewweeks. The OPT therapist worked with the patient utilizing an in session Cognitive Behavioral Therapy exercise. The patient was  responsive in the session and verbalized, "My mood has been good and I am waiting for my sister to arrive she is staying with me for the next few days and I haven't seen her In over a year".The OPT therapist will continue treatment work with the patient in hernext scheduled session  Plan: Return again in3weeks.  Diagnosis:Axis I:MDD (major depressive disorder), recurrent, in partial remission   Axis II:No diagnosis  I discussed the assessment and treatment plan with the patient. The patient was provided an opportunity to ask questions and all were answered. The patient agreed with the plan and demonstrated an understanding of the instructions.  The patient was advised to call back or seek an in-person evaluation if the symptoms worsen or if the condition fails to improve as anticipated.  I provided12mnutes of non-face-to-face time during this encounter.  TLennox Grumbles LCSW 02/12/2020

## 2020-02-13 DIAGNOSIS — Z992 Dependence on renal dialysis: Secondary | ICD-10-CM | POA: Diagnosis not present

## 2020-02-13 DIAGNOSIS — N186 End stage renal disease: Secondary | ICD-10-CM | POA: Diagnosis not present

## 2020-02-13 DIAGNOSIS — D631 Anemia in chronic kidney disease: Secondary | ICD-10-CM | POA: Diagnosis not present

## 2020-02-13 DIAGNOSIS — N250 Renal osteodystrophy: Secondary | ICD-10-CM | POA: Diagnosis not present

## 2020-02-16 DIAGNOSIS — N186 End stage renal disease: Secondary | ICD-10-CM | POA: Diagnosis not present

## 2020-02-16 DIAGNOSIS — D631 Anemia in chronic kidney disease: Secondary | ICD-10-CM | POA: Diagnosis not present

## 2020-02-16 DIAGNOSIS — Z992 Dependence on renal dialysis: Secondary | ICD-10-CM | POA: Diagnosis not present

## 2020-02-16 DIAGNOSIS — N250 Renal osteodystrophy: Secondary | ICD-10-CM | POA: Diagnosis not present

## 2020-02-18 DIAGNOSIS — N186 End stage renal disease: Secondary | ICD-10-CM | POA: Diagnosis not present

## 2020-02-18 DIAGNOSIS — N250 Renal osteodystrophy: Secondary | ICD-10-CM | POA: Diagnosis not present

## 2020-02-18 DIAGNOSIS — Z992 Dependence on renal dialysis: Secondary | ICD-10-CM | POA: Diagnosis not present

## 2020-02-18 DIAGNOSIS — D631 Anemia in chronic kidney disease: Secondary | ICD-10-CM | POA: Diagnosis not present

## 2020-02-20 DIAGNOSIS — D631 Anemia in chronic kidney disease: Secondary | ICD-10-CM | POA: Diagnosis not present

## 2020-02-20 DIAGNOSIS — Z992 Dependence on renal dialysis: Secondary | ICD-10-CM | POA: Diagnosis not present

## 2020-02-20 DIAGNOSIS — N250 Renal osteodystrophy: Secondary | ICD-10-CM | POA: Diagnosis not present

## 2020-02-20 DIAGNOSIS — N186 End stage renal disease: Secondary | ICD-10-CM | POA: Diagnosis not present

## 2020-02-23 DIAGNOSIS — N186 End stage renal disease: Secondary | ICD-10-CM | POA: Diagnosis not present

## 2020-02-23 DIAGNOSIS — Z992 Dependence on renal dialysis: Secondary | ICD-10-CM | POA: Diagnosis not present

## 2020-02-23 DIAGNOSIS — D631 Anemia in chronic kidney disease: Secondary | ICD-10-CM | POA: Diagnosis not present

## 2020-02-23 DIAGNOSIS — N250 Renal osteodystrophy: Secondary | ICD-10-CM | POA: Diagnosis not present

## 2020-02-27 DIAGNOSIS — N250 Renal osteodystrophy: Secondary | ICD-10-CM | POA: Diagnosis not present

## 2020-02-27 DIAGNOSIS — Z992 Dependence on renal dialysis: Secondary | ICD-10-CM | POA: Diagnosis not present

## 2020-02-27 DIAGNOSIS — N186 End stage renal disease: Secondary | ICD-10-CM | POA: Diagnosis not present

## 2020-02-27 DIAGNOSIS — D631 Anemia in chronic kidney disease: Secondary | ICD-10-CM | POA: Diagnosis not present

## 2020-03-01 DIAGNOSIS — Z992 Dependence on renal dialysis: Secondary | ICD-10-CM | POA: Diagnosis not present

## 2020-03-01 DIAGNOSIS — N250 Renal osteodystrophy: Secondary | ICD-10-CM | POA: Diagnosis not present

## 2020-03-01 DIAGNOSIS — D631 Anemia in chronic kidney disease: Secondary | ICD-10-CM | POA: Diagnosis not present

## 2020-03-01 DIAGNOSIS — N186 End stage renal disease: Secondary | ICD-10-CM | POA: Diagnosis not present

## 2020-03-02 ENCOUNTER — Ambulatory Visit: Payer: Medicare Other | Admitting: Gastroenterology

## 2020-03-03 DIAGNOSIS — D631 Anemia in chronic kidney disease: Secondary | ICD-10-CM | POA: Diagnosis not present

## 2020-03-03 DIAGNOSIS — Z992 Dependence on renal dialysis: Secondary | ICD-10-CM | POA: Diagnosis not present

## 2020-03-03 DIAGNOSIS — N186 End stage renal disease: Secondary | ICD-10-CM | POA: Diagnosis not present

## 2020-03-03 DIAGNOSIS — N250 Renal osteodystrophy: Secondary | ICD-10-CM | POA: Diagnosis not present

## 2020-03-05 DIAGNOSIS — N250 Renal osteodystrophy: Secondary | ICD-10-CM | POA: Diagnosis not present

## 2020-03-05 DIAGNOSIS — N186 End stage renal disease: Secondary | ICD-10-CM | POA: Diagnosis not present

## 2020-03-05 DIAGNOSIS — D631 Anemia in chronic kidney disease: Secondary | ICD-10-CM | POA: Diagnosis not present

## 2020-03-05 DIAGNOSIS — Z992 Dependence on renal dialysis: Secondary | ICD-10-CM | POA: Diagnosis not present

## 2020-03-08 DIAGNOSIS — N250 Renal osteodystrophy: Secondary | ICD-10-CM | POA: Diagnosis not present

## 2020-03-08 DIAGNOSIS — Z992 Dependence on renal dialysis: Secondary | ICD-10-CM | POA: Diagnosis not present

## 2020-03-08 DIAGNOSIS — N186 End stage renal disease: Secondary | ICD-10-CM | POA: Diagnosis not present

## 2020-03-08 DIAGNOSIS — D631 Anemia in chronic kidney disease: Secondary | ICD-10-CM | POA: Diagnosis not present
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
## 2020-03-11 ENCOUNTER — Other Ambulatory Visit: Payer: Self-pay | Admitting: Family Medicine

## 2020-03-11 DIAGNOSIS — N250 Renal osteodystrophy: Secondary | ICD-10-CM | POA: Diagnosis not present

## 2020-03-11 DIAGNOSIS — D631 Anemia in chronic kidney disease: Secondary | ICD-10-CM | POA: Diagnosis not present

## 2020-03-11 DIAGNOSIS — Z992 Dependence on renal dialysis: Secondary | ICD-10-CM | POA: Diagnosis not present

## 2020-03-11 DIAGNOSIS — N186 End stage renal disease: Secondary | ICD-10-CM | POA: Diagnosis not present

## 2020-03-12 DIAGNOSIS — D631 Anemia in chronic kidney disease: Secondary | ICD-10-CM | POA: Diagnosis not present

## 2020-03-12 DIAGNOSIS — Z992 Dependence on renal dialysis: Secondary | ICD-10-CM | POA: Diagnosis not present

## 2020-03-12 DIAGNOSIS — N250 Renal osteodystrophy: Secondary | ICD-10-CM | POA: Diagnosis not present

## 2020-03-12 DIAGNOSIS — N186 End stage renal disease: Secondary | ICD-10-CM | POA: Diagnosis not present

## 2020-03-14 ENCOUNTER — Encounter (HOSPITAL_COMMUNITY): Payer: Self-pay | Admitting: Psychiatry

## 2020-03-14 ENCOUNTER — Other Ambulatory Visit: Payer: Self-pay

## 2020-03-14 ENCOUNTER — Telehealth (INDEPENDENT_AMBULATORY_CARE_PROVIDER_SITE_OTHER): Payer: Medicare Other | Admitting: Psychiatry

## 2020-03-14 DIAGNOSIS — F330 Major depressive disorder, recurrent, mild: Secondary | ICD-10-CM | POA: Diagnosis not present

## 2020-03-14 MED ORDER — ARIPIPRAZOLE 5 MG PO TABS: 5.0000 mg | ORAL_TABLET | Freq: Every day | ORAL | 0 refills | Status: DC

## 2020-03-14 NOTE — Patient Instructions (Signed)
1.Continuesertraline 150 mg daily 2.ContinueAbilify 5 mg at night  3. Next appointment: 11/8 at 11 AM

## 2020-03-15 ENCOUNTER — Telehealth (HOSPITAL_COMMUNITY): Payer: Medicare Other | Admitting: Psychiatry

## 2020-03-15 DIAGNOSIS — N250 Renal osteodystrophy: Secondary | ICD-10-CM | POA: Diagnosis not present

## 2020-03-15 DIAGNOSIS — Z992 Dependence on renal dialysis: Secondary | ICD-10-CM | POA: Diagnosis not present

## 2020-03-15 DIAGNOSIS — N186 End stage renal disease: Secondary | ICD-10-CM | POA: Diagnosis not present

## 2020-03-15 DIAGNOSIS — D631 Anemia in chronic kidney disease: Secondary | ICD-10-CM | POA: Diagnosis not present

## 2020-03-17 DIAGNOSIS — Z992 Dependence on renal dialysis: Secondary | ICD-10-CM | POA: Diagnosis not present

## 2020-03-17 DIAGNOSIS — D631 Anemia in chronic kidney disease: Secondary | ICD-10-CM | POA: Diagnosis not present

## 2020-03-17 DIAGNOSIS — N186 End stage renal disease: Secondary | ICD-10-CM | POA: Diagnosis not present

## 2020-03-17 DIAGNOSIS — N250 Renal osteodystrophy: Secondary | ICD-10-CM | POA: Diagnosis not present

## 2020-03-19 DIAGNOSIS — N186 End stage renal disease: Secondary | ICD-10-CM | POA: Diagnosis not present

## 2020-03-19 DIAGNOSIS — Z992 Dependence on renal dialysis: Secondary | ICD-10-CM | POA: Diagnosis not present

## 2020-03-19 DIAGNOSIS — N250 Renal osteodystrophy: Secondary | ICD-10-CM | POA: Diagnosis not present

## 2020-03-19 DIAGNOSIS — D631 Anemia in chronic kidney disease: Secondary | ICD-10-CM | POA: Diagnosis not present

## 2020-03-21 ENCOUNTER — Ambulatory Visit (HOSPITAL_COMMUNITY): Payer: Medicare Other | Admitting: Clinical

## 2020-03-21 ENCOUNTER — Other Ambulatory Visit: Payer: Self-pay

## 2020-03-22 DIAGNOSIS — N250 Renal osteodystrophy: Secondary | ICD-10-CM | POA: Diagnosis not present

## 2020-03-22 DIAGNOSIS — N186 End stage renal disease: Secondary | ICD-10-CM | POA: Diagnosis not present

## 2020-03-22 DIAGNOSIS — D631 Anemia in chronic kidney disease: Secondary | ICD-10-CM | POA: Diagnosis not present

## 2020-03-22 DIAGNOSIS — Z992 Dependence on renal dialysis: Secondary | ICD-10-CM | POA: Diagnosis not present

## 2020-03-24 DIAGNOSIS — N186 End stage renal disease: Secondary | ICD-10-CM | POA: Diagnosis not present

## 2020-03-24 DIAGNOSIS — Z992 Dependence on renal dialysis: Secondary | ICD-10-CM | POA: Diagnosis not present

## 2020-03-24 DIAGNOSIS — N250 Renal osteodystrophy: Secondary | ICD-10-CM | POA: Diagnosis not present

## 2020-03-24 DIAGNOSIS — D631 Anemia in chronic kidney disease: Secondary | ICD-10-CM | POA: Diagnosis not present

## 2020-03-25 ENCOUNTER — Other Ambulatory Visit: Payer: Self-pay

## 2020-03-25 ENCOUNTER — Ambulatory Visit (INDEPENDENT_AMBULATORY_CARE_PROVIDER_SITE_OTHER): Payer: Medicare Other | Admitting: Clinical

## 2020-03-25 DIAGNOSIS — F3341 Major depressive disorder, recurrent, in partial remission: Secondary | ICD-10-CM | POA: Diagnosis not present

## 2020-03-25 NOTE — Progress Notes (Addendum)
Virtual Visit via Video Note  I connected with Kara Knapp on 03/25/20 at 11:00 AM EDT by a video enabled telemedicine application and verified that I am speaking with the correct person using two identifiers.  Location: Patient:Home Provider:Office  I discussed the limitations of evaluation and management by telemedicine and the availability of in person appointments. The patient expressed understanding and agreed to proceed.    THERAPIST PROGRESS NOTE  Session Time:11:00AM-11:30AM  Participation Level:Active  Behavioral Response:CasualAlertDepressed  Type of Therapy:Individual Therapy  Treatment Goals addressed:Coping  Interventions:CBT  Summary:Kara A Berrieris a 49 y.o.femalewho presents with Depression.The OPT therapist worked with thepatientfor herongoing OPT treatmentsession. The OPT therapist utilized Motivational Interviewing to assist in creating therapeutic repore. The patient in the session was engaged and work in Science writer about hertriggers and symptoms over the past few weeksincluding ongoing work to adjust to living alone, ongoing management of her feelings around being blind, and a recent change in her seating in Dialysis that triggered a behavioral episode. The OPT therapist utilized Cognitive Behavioral Therapy through cognitive restructuring as well as worked with the patient on coping strategies to assist in management ofmood.The patient reportedthe she continues to work to adjust, and worked with the [place] therapist in session on implementing positive thinking.  Suicidal/Homicidal:Nowithout intent/plan  Therapist Response:The OPT therapist worked with the patient for the patients scheduled session. The patient was engaged in his session and gave feedback in relation to triggers, symptoms, and behavior responses over the pastfewweeks. The OPT therapist worked with the patient utilizing an in session  Cognitive Behavioral Therapy exercise. The patient was responsive in the session and verbalized, "At first when they changed when I sit in Dialysis I got very upset because this moves be away from people I know an I like to talk too, but I know I am going to have to readjust and when I went last time it wasn't as difficult for me".The OPT therapist worked with the patient on implementing positive thinking and reviewing how this impacts mood. The OPT therapist will continue treatment work with the patient in hernext scheduled session  Plan: Return again in3weeks.  Diagnosis:Axis I:MDD (major depressive disorder), recurrent, in partial remission   Axis II:No diagnosis  I discussed the assessment and treatment plan with the patient. The patient was provided an opportunity to ask questions and all were answered. The patient agreed with the plan and demonstrated an understanding of the instructions.  The patient was advised to call back or seek an in-person evaluation if the symptoms worsen or if the condition fails to improve as anticipated.  I provided27mnutes of non-face-to-face time during this encounter.  TLennox Grumbles LCSW 03/25/2020

## 2020-03-28 DIAGNOSIS — N250 Renal osteodystrophy: Secondary | ICD-10-CM | POA: Diagnosis not present

## 2020-03-28 DIAGNOSIS — D631 Anemia in chronic kidney disease: Secondary | ICD-10-CM | POA: Diagnosis not present

## 2020-03-28 DIAGNOSIS — N186 End stage renal disease: Secondary | ICD-10-CM | POA: Diagnosis not present

## 2020-03-28 DIAGNOSIS — Z992 Dependence on renal dialysis: Secondary | ICD-10-CM | POA: Diagnosis not present

## 2020-03-28 NOTE — Addendum Note (Signed)
Addended by: Maye Hides T on: 03/28/2020 01:46 PM   Modules accepted: Level of Service

## 2020-03-29 DIAGNOSIS — N250 Renal osteodystrophy: Secondary | ICD-10-CM | POA: Diagnosis not present

## 2020-03-29 DIAGNOSIS — N186 End stage renal disease: Secondary | ICD-10-CM | POA: Diagnosis not present

## 2020-03-29 DIAGNOSIS — Z992 Dependence on renal dialysis: Secondary | ICD-10-CM | POA: Diagnosis not present

## 2020-03-29 DIAGNOSIS — E119 Type 2 diabetes mellitus without complications: Secondary | ICD-10-CM | POA: Diagnosis not present

## 2020-03-29 DIAGNOSIS — D631 Anemia in chronic kidney disease: Secondary | ICD-10-CM | POA: Diagnosis not present

## 2020-04-01 ENCOUNTER — Other Ambulatory Visit (INDEPENDENT_AMBULATORY_CARE_PROVIDER_SITE_OTHER): Payer: Self-pay | Admitting: Ophthalmology

## 2020-04-01 DIAGNOSIS — D509 Iron deficiency anemia, unspecified: Secondary | ICD-10-CM | POA: Diagnosis not present

## 2020-04-01 DIAGNOSIS — N186 End stage renal disease: Secondary | ICD-10-CM | POA: Diagnosis not present

## 2020-04-01 DIAGNOSIS — Z992 Dependence on renal dialysis: Secondary | ICD-10-CM | POA: Diagnosis not present

## 2020-04-01 DIAGNOSIS — D631 Anemia in chronic kidney disease: Secondary | ICD-10-CM | POA: Diagnosis not present

## 2020-04-01 DIAGNOSIS — N250 Renal osteodystrophy: Secondary | ICD-10-CM | POA: Diagnosis not present

## 2020-04-02 DIAGNOSIS — Z992 Dependence on renal dialysis: Secondary | ICD-10-CM | POA: Diagnosis not present

## 2020-04-02 DIAGNOSIS — D631 Anemia in chronic kidney disease: Secondary | ICD-10-CM | POA: Diagnosis not present

## 2020-04-02 DIAGNOSIS — D509 Iron deficiency anemia, unspecified: Secondary | ICD-10-CM | POA: Diagnosis not present

## 2020-04-02 DIAGNOSIS — N186 End stage renal disease: Secondary | ICD-10-CM | POA: Diagnosis not present

## 2020-04-02 DIAGNOSIS — N250 Renal osteodystrophy: Secondary | ICD-10-CM | POA: Diagnosis not present

## 2020-04-05 ENCOUNTER — Other Ambulatory Visit: Payer: Self-pay | Admitting: Family Medicine

## 2020-04-05 DIAGNOSIS — M549 Dorsalgia, unspecified: Secondary | ICD-10-CM

## 2020-04-05 DIAGNOSIS — Z992 Dependence on renal dialysis: Secondary | ICD-10-CM | POA: Diagnosis not present

## 2020-04-05 DIAGNOSIS — D509 Iron deficiency anemia, unspecified: Secondary | ICD-10-CM | POA: Diagnosis not present

## 2020-04-05 DIAGNOSIS — D631 Anemia in chronic kidney disease: Secondary | ICD-10-CM | POA: Diagnosis not present

## 2020-04-05 DIAGNOSIS — M62838 Other muscle spasm: Secondary | ICD-10-CM

## 2020-04-05 DIAGNOSIS — N250 Renal osteodystrophy: Secondary | ICD-10-CM | POA: Diagnosis not present

## 2020-04-05 DIAGNOSIS — N186 End stage renal disease: Secondary | ICD-10-CM | POA: Diagnosis not present

## 2020-04-07 DIAGNOSIS — Z992 Dependence on renal dialysis: Secondary | ICD-10-CM | POA: Diagnosis not present

## 2020-04-07 DIAGNOSIS — D509 Iron deficiency anemia, unspecified: Secondary | ICD-10-CM | POA: Diagnosis not present

## 2020-04-07 DIAGNOSIS — D631 Anemia in chronic kidney disease: Secondary | ICD-10-CM | POA: Diagnosis not present

## 2020-04-07 DIAGNOSIS — N250 Renal osteodystrophy: Secondary | ICD-10-CM | POA: Diagnosis not present

## 2020-04-07 DIAGNOSIS — N186 End stage renal disease: Secondary | ICD-10-CM | POA: Diagnosis not present

## 2020-04-08 DIAGNOSIS — R3911 Hesitancy of micturition: Secondary | ICD-10-CM | POA: Diagnosis not present

## 2020-04-08 DIAGNOSIS — E1165 Type 2 diabetes mellitus with hyperglycemia: Secondary | ICD-10-CM | POA: Diagnosis not present

## 2020-04-08 DIAGNOSIS — Z992 Dependence on renal dialysis: Secondary | ICD-10-CM | POA: Diagnosis not present

## 2020-04-08 DIAGNOSIS — N186 End stage renal disease: Secondary | ICD-10-CM | POA: Diagnosis not present

## 2020-04-08 DIAGNOSIS — Z299 Encounter for prophylactic measures, unspecified: Secondary | ICD-10-CM | POA: Diagnosis not present

## 2020-04-09 DIAGNOSIS — N250 Renal osteodystrophy: Secondary | ICD-10-CM | POA: Diagnosis not present

## 2020-04-09 DIAGNOSIS — N186 End stage renal disease: Secondary | ICD-10-CM | POA: Diagnosis not present

## 2020-04-09 DIAGNOSIS — D509 Iron deficiency anemia, unspecified: Secondary | ICD-10-CM | POA: Diagnosis not present

## 2020-04-09 DIAGNOSIS — Z992 Dependence on renal dialysis: Secondary | ICD-10-CM | POA: Diagnosis not present

## 2020-04-09 DIAGNOSIS — D631 Anemia in chronic kidney disease: Secondary | ICD-10-CM | POA: Diagnosis not present

## 2020-04-13 ENCOUNTER — Encounter (INDEPENDENT_AMBULATORY_CARE_PROVIDER_SITE_OTHER): Payer: Medicare Other | Admitting: Ophthalmology

## 2020-04-14 DIAGNOSIS — D509 Iron deficiency anemia, unspecified: Secondary | ICD-10-CM | POA: Diagnosis not present

## 2020-04-14 DIAGNOSIS — N186 End stage renal disease: Secondary | ICD-10-CM | POA: Diagnosis not present

## 2020-04-14 DIAGNOSIS — Z992 Dependence on renal dialysis: Secondary | ICD-10-CM | POA: Diagnosis not present

## 2020-04-14 DIAGNOSIS — N250 Renal osteodystrophy: Secondary | ICD-10-CM | POA: Diagnosis not present

## 2020-04-14 DIAGNOSIS — D631 Anemia in chronic kidney disease: Secondary | ICD-10-CM | POA: Diagnosis not present

## 2020-04-16 DIAGNOSIS — Z992 Dependence on renal dialysis: Secondary | ICD-10-CM | POA: Diagnosis not present

## 2020-04-16 DIAGNOSIS — N250 Renal osteodystrophy: Secondary | ICD-10-CM | POA: Diagnosis not present

## 2020-04-16 DIAGNOSIS — N186 End stage renal disease: Secondary | ICD-10-CM | POA: Diagnosis not present

## 2020-04-16 DIAGNOSIS — D509 Iron deficiency anemia, unspecified: Secondary | ICD-10-CM | POA: Diagnosis not present

## 2020-04-16 DIAGNOSIS — D631 Anemia in chronic kidney disease: Secondary | ICD-10-CM | POA: Diagnosis not present

## 2020-04-18 ENCOUNTER — Ambulatory Visit (INDEPENDENT_AMBULATORY_CARE_PROVIDER_SITE_OTHER): Payer: Medicare Other | Admitting: Clinical

## 2020-04-18 ENCOUNTER — Other Ambulatory Visit: Payer: Self-pay

## 2020-04-18 DIAGNOSIS — F3341 Major depressive disorder, recurrent, in partial remission: Secondary | ICD-10-CM

## 2020-04-18 NOTE — Progress Notes (Signed)
Virtual Visit via Telephone Note  I connected with Kara Knapp on 04/18/20 at 11:00 AM EDT by telephone and verified that I am speaking with the correct person using two identifiers.  Location: Patient:Home Provider:Office  I discussed the limitations of evaluation and management by telemedicine and the availability of in person appointments. The patient expressed understanding and agreed to proceed.    THERAPIST PROGRESS NOTE  Session Time:11:00AM-11:30AM  Participation Level:Active  Behavioral Response:CasualAlertDepressed  Type of Therapy:Individual Therapy  Treatment Goals addressed:Coping  Interventions:CBT  Summary:Raegen A Berrieris a 49 y.o.femalewho presents with Depression.The OPT therapist worked with thepatientfor herongoing OPT treatmentsession. The OPT therapist utilized Motivational Interviewing to assist in creating therapeutic repore. The patient in the session was engaged and work in Science writer about hertriggers and symptoms over the past few weeksincluding ongoing work to adjust to living alone, conflict with a long time friend, and working to find activity on the weekend to stay active. The OPT therapist utilized Cognitive Behavioral Therapy through cognitive restructuring as well as worked with the patient on coping strategies to assist in management ofmood.The patient reportedthe shecontinues to work to adjust, and worked with the [place] therapist in session on implementing positive thinking, self esteem, and staying active.  Suicidal/Homicidal:Nowithout intent/plan  Therapist Response:The OPT therapist worked with the patient for the patients scheduled session. The patient was engaged in his session and gave feedback in relation to triggers, symptoms, and behavior responses over the pastfewweeks. The OPT therapist worked with the patient utilizing an in session Cognitive Behavioral Therapy exercise. The  patient was responsive in the session and verbalized, "I have gotten adjusted now at dialysis and I think I have to start planning things like going back to church on the weekend to help keep my mood up".The OPT therapist worked with the patient on implementing positive thinking and reviewing how this impacts mood. The OPT therapist will continue treatment work with the patient in hernext scheduled session  Plan: Return again in3weeks.  Diagnosis:Axis I:MDD (major depressive disorder), recurrent, in partial remission   Axis II:No diagnosis  I discussed the assessment and treatment plan with the patient. The patient was provided an opportunity to ask questions and all were answered. The patient agreed with the plan and demonstrated an understanding of the instructions.  The patient was advised to call back or seek an in-person evaluation if the symptoms worsen or if the condition fails to improve as anticipated.  I provided50mnutes of non-face-to-face time during this encounter.  TLennox Grumbles LCSW 04/18/2020

## 2020-04-19 ENCOUNTER — Other Ambulatory Visit: Payer: Self-pay | Admitting: Family Medicine

## 2020-04-19 DIAGNOSIS — N186 End stage renal disease: Secondary | ICD-10-CM | POA: Diagnosis not present

## 2020-04-19 DIAGNOSIS — D631 Anemia in chronic kidney disease: Secondary | ICD-10-CM | POA: Diagnosis not present

## 2020-04-19 DIAGNOSIS — D509 Iron deficiency anemia, unspecified: Secondary | ICD-10-CM | POA: Diagnosis not present

## 2020-04-19 DIAGNOSIS — Z992 Dependence on renal dialysis: Secondary | ICD-10-CM | POA: Diagnosis not present

## 2020-04-19 DIAGNOSIS — N250 Renal osteodystrophy: Secondary | ICD-10-CM | POA: Diagnosis not present

## 2020-04-20 ENCOUNTER — Other Ambulatory Visit: Payer: Self-pay | Admitting: Family Medicine

## 2020-04-23 DIAGNOSIS — Z992 Dependence on renal dialysis: Secondary | ICD-10-CM | POA: Diagnosis not present

## 2020-04-23 DIAGNOSIS — N250 Renal osteodystrophy: Secondary | ICD-10-CM | POA: Diagnosis not present

## 2020-04-23 DIAGNOSIS — D631 Anemia in chronic kidney disease: Secondary | ICD-10-CM | POA: Diagnosis not present

## 2020-04-23 DIAGNOSIS — D509 Iron deficiency anemia, unspecified: Secondary | ICD-10-CM | POA: Diagnosis not present

## 2020-04-23 DIAGNOSIS — N186 End stage renal disease: Secondary | ICD-10-CM | POA: Diagnosis not present

## 2020-04-26 DIAGNOSIS — D631 Anemia in chronic kidney disease: Secondary | ICD-10-CM | POA: Diagnosis not present

## 2020-04-26 DIAGNOSIS — E119 Type 2 diabetes mellitus without complications: Secondary | ICD-10-CM | POA: Diagnosis not present

## 2020-04-26 DIAGNOSIS — Z794 Long term (current) use of insulin: Secondary | ICD-10-CM | POA: Diagnosis not present

## 2020-04-26 DIAGNOSIS — Z992 Dependence on renal dialysis: Secondary | ICD-10-CM | POA: Diagnosis not present

## 2020-04-26 DIAGNOSIS — N186 End stage renal disease: Secondary | ICD-10-CM | POA: Diagnosis not present

## 2020-04-26 DIAGNOSIS — D509 Iron deficiency anemia, unspecified: Secondary | ICD-10-CM | POA: Diagnosis not present

## 2020-04-26 DIAGNOSIS — N250 Renal osteodystrophy: Secondary | ICD-10-CM | POA: Diagnosis not present

## 2020-04-27 ENCOUNTER — Other Ambulatory Visit: Payer: Self-pay

## 2020-04-27 ENCOUNTER — Encounter (INDEPENDENT_AMBULATORY_CARE_PROVIDER_SITE_OTHER): Payer: Self-pay | Admitting: Ophthalmology

## 2020-04-27 ENCOUNTER — Ambulatory Visit (INDEPENDENT_AMBULATORY_CARE_PROVIDER_SITE_OTHER): Payer: Medicare Other | Admitting: Ophthalmology

## 2020-04-27 DIAGNOSIS — E113511 Type 2 diabetes mellitus with proliferative diabetic retinopathy with macular edema, right eye: Secondary | ICD-10-CM

## 2020-04-27 MED ORDER — BEVACIZUMAB CHEMO INJECTION 1.25MG/0.05ML SYRINGE FOR KALEIDOSCOPE
1.2500 mg | INTRAVITREAL | Status: AC | PRN
  Administered 2020-04-27: 1.25 mg via INTRAVITREAL

## 2020-04-27 NOTE — Progress Notes (Signed)
04/27/2020     CHIEF COMPLAINT Patient presents for Retina Follow Up   HISTORY OF PRESENT ILLNESS: Kara Knapp is a 49 y.o. female who presents to the clinic today for:   HPI    Retina Follow Up    Patient presents with  Diabetic Retinopathy.  In right eye.  This started 3 months ago.  Severity is mild.  Duration of 3 months.  Since onset it is gradually worsening.          Comments    3 Month Diabetic F/U OU, poss Avastin OD  Pt reports blur OD. Pt denies ocular pain, flashes of light, or floaters OU. Pt sts she needs combigan refill OU. Pt sts she is using combigan OU at night only. A1c: 6.5, 01/2020 LBS: 171 this AM         Last edited by Rockie Neighbours, [place] on 04/27/2020 10:14 AM. (History)      Referring physician: Dettinger, Fransisca Kaufmann, MD [place],  [place] [address]  HISTORICAL INFORMATION:   Selected notes from the MEDICAL RECORD NUMBER    Lab Results  Component Value Date   HGBA1C 7.2 (H) 08/29/2018     CURRENT MEDICATIONS: Current Outpatient Medications (Ophthalmic Drugs)  Medication Sig  . COMBIGAN 0.2-0.5 % ophthalmic solution INSTILL ONE DROP IN [facility] EYE TWICE DAILY   No current facility-administered medications for this visit. (Ophthalmic Drugs)   Current Outpatient Medications (Other)  Medication Sig  . acetaminophen (TYLENOL) 500 MG tablet Take 1,000 mg by mouth every 6 (six) hours as needed for moderate pain or headache.  Marland Kitchen apixaban (ELIQUIS) 2.5 MG TABS tablet Take 2.5 mg by mouth 2 (two) times daily.  . ARIPiprazole (ABILIFY) 5 MG tablet Take 1 tablet (5 mg total) by mouth daily.  Marland Kitchen azelastine (ASTELIN) 0.1 % nasal spray Place 1 spray into both nostrils 2 (two) times daily. Use in each nostril as directed (Patient taking differently: Place 1 spray into both nostrils daily as needed for rhinitis. )  . carvedilol (COREG) 12.5 MG tablet Take 12.5 mg by mouth 2 (two) times daily.  . cholecalciferol (VITAMIN D) 1000 units tablet  Take 1,000 Units by mouth daily.   . Continuous Blood Gluc Sensor (FREESTYLE LIBRE 14 DAY SENSOR) MISC Inject 1 each into the skin every 14 (fourteen) days. Use as directed.  . cyclobenzaprine (FLEXERIL) 5 MG tablet TAKE ONE TABLET BY MOUTH THREE TIMES DAILY AS NEEDED FOR MUSCLE SPASM  . diclofenac sodium (VOLTAREN) 1 % GEL Apply 2 g topically 4 (four) times daily. (Patient taking differently: Apply 2 g topically 4 (four) times daily as needed (pain). )  . gabapentin (NEURONTIN) 100 MG capsule Take 1 capsule (100 mg total) by mouth 3 (three) times daily. (Needs to be seen before next refill)  . glucose blood (PRODIGY NO CODING BLOOD GLUC) test strip USE TO CHECK BLOOD SUGAR TWICE DAILY. Dx E11.22  . ibuprofen (ADVIL) 200 MG tablet Take 600 mg by mouth every 6 (six) hours as needed for headache or moderate pain.  . Insulin Pen Needle (TRUEPLUS PEN NEEDLES) 31G X 5 MM MISC Use daily with insulin Dx E11.22  . LANTUS SOLOSTAR 100 UNIT/ML Solostar Pen INJECT 18-50 UNITS SUBCUTANEOUSLY AT BEDTIME. (Patient taking differently: Inject 20 Units into the skin at bedtime. )  . lidocaine-prilocaine (EMLA) cream Apply 1 application topically daily as needed (arm port).  Marland Kitchen linagliptin (TRADJENTA) 5 MG TABS tablet Take 1 tablet (5 mg total) by  mouth daily. (Patient taking differently: Take 5 mg by mouth every evening. )  . multivitamin (RENA-VIT) TABS tablet Take 1 tablet by mouth daily.  Marland Kitchen oxyCODONE-acetaminophen (PERCOCET) 7.5-325 MG tablet Take 1 tablet by mouth every 4 (four) hours as needed for moderate pain or severe pain. (Patient not taking: Reported on 01/04/2020)  . pantoprazole (PROTONIX) 40 MG tablet TAKE 1 BY MOUTH DAILY 30 minutes before breakfast (Patient taking differently: Take 40 mg by mouth daily. )  . Probiotic Product (CULTURELLE PROBIOTICS PO) Take 1 capsule by mouth daily.   Marland Kitchen PRODIGY TWIST TOP LANCETS 28G MISC USE TO CHECK BLOOD SUGAR UP TO FOUR TIMES DAILY.  Marland Kitchen sertraline (ZOLOFT) 100 MG  tablet Take 1.5 tablets (150 mg total) by mouth daily.  . sevelamer carbonate (RENVELA) 800 MG tablet Take 800-2,400 mg by mouth See admin instructions. Take 2458m by mouth three times daily with means and 8016mwith snacks  . SYMBICORT 160-4.5 MCG/ACT inhaler   . traZODone (DESYREL) 50 MG tablet Take 0.5-1 tablets (25-50 mg total) by mouth at bedtime as needed. for sleep (Patient taking differently: Take 25 mg by mouth at bedtime as needed for sleep. )  . vancomycin IVPB Inject into the vein. With Dialysis.  . Marland KitchenENTOLIN HFA 108 (90 Base) MCG/ACT inhaler    No current facility-administered medications for this visit. (Other)      REVIEW OF SYSTEMS:    ALLERGIES Allergies  Allergen Reactions  . Sulfamethoxazole-Trimethoprim Nausea And Vomiting  . Bactrim [Sulfamethoxazole-Trimethoprim] Nausea And Vomiting  . Prednisone Other (See Comments)    "I was wide open and couldn't eat" per pt. Loss of appetite and insomnia  LOSS OF APPETITE,UNABLE TO SLEEP    PAST MEDICAL HISTORY Past Medical History:  Diagnosis Date  . Anemia of chronic disease   . Anxiety   . Asthma   . Blind left eye   . Bronchitis   . Cataract   . Cholecystitis, acute 05/26/2013   Status post cholecystectomy  . Chronic abdominal pain   . Chronic diarrhea   . COPD (chronic obstructive pulmonary disease) (HCLake [place]  . Depression   . Diabetic foot ulcer (HCG[place]08/08/2014  . Diastolic heart failure (HC[facility]  . ESRD on hemodialysis ([place]CD[place]   Started diaylsis 12/29/15  . Essential hypertension   . Fibroids   . Glaucoma   . History of blood transfusion   . History of pneumonia   . Hyperlipidemia   . Insulin-dependent diabetes mellitus with retinopathy   . Liver fibrosis    Negative Hep B surface antigen, negative Hep C antibody Feb 2018 (see scanned in labs).  . Neuropathy   . Osteomyelitis (HCOver[place]   Toe on left foot   Past Surgical History:  Procedure Laterality Date  . A/V SHUNTOGRAM N/A 10/25/2016   Procedure:  A/V Shuntogram - Right Arm;  Surgeon: BrWaynetta SandyMD;  [facility];  Service: Cardiovascular;  Laterality: N/A;  . A/V SHUNTOGRAM N/A 03/05/2018   Procedure: A/V SHUNTOGRAM - Right Arm;  Surgeon: CaWaynetta SandyMD;  [facility];  Service: Cardiovascular;  Laterality: N/A;  . AV FISTULA PLACEMENT Right 10/17/2015   Procedure: INSERTION OF ARTERIOVENOUS GORE-TEX GRAFT RIGHT UPPER ARM WITH ACUSEAL;  Surgeon: BrConrad BurlingtonMD;  [facility] Service: Vascular;  Laterality: Right;  . AV FISTULA PLACEMENT Left 11/18/2019   Procedure: INSERTION OF ARTERIOVENOUS (AV) GORE-TEX GRAFT left  ARM;  Surgeon: BrSerafina Mitchell  MD;  Location: [facility];  Service: Vascular;  Laterality: Left;  . CATARACT EXTRACTION W/ INTRAOCULAR LENS IMPLANT Bilateral   . CESAREAN SECTION    . CHOLECYSTECTOMY N/A 05/25/2013   Procedure: LAPAROSCOPIC CHOLECYSTECTOMY;  Surgeon: Jamesetta So, MD;  Location: [facility];  Service: General;  Laterality: N/A;  . COLONOSCOPY WITH PROPOFOL N/A 10/02/2016   two 3 to 5 mm polyps in the descending colon, three 2 to 3 mm polyps in the rectum, random colon biopsies, rectal bleeding due to internal hemorrhoids, friability with no bleeding at the anus status post biopsy.  Surgical pathology found the polyps to be a mix of hyperplastic and tubular adenoma, random colon biopsies to be benign colonic mucosa, and the anal biopsies to be anal skin tag.   Marland Kitchen ESOPHAGOGASTRODUODENOSCOPY (EGD) WITH PROPOFOL N/A 10/02/2016   mucosal nodule in the esophagus status post biopsy, moderate gastritis status post biopsy, mild duodenitis. esophageal biopsy to be benign, gastric biopsies to be gastritis due to aspirin use, and duodenal biopsies to be duodenitis due to aspirin use  . EYE SURGERY Bilateral   . IR FLUORO GUIDE CV LINE RIGHT  10/09/2019  . IR REMOVAL TUN CV CATH W/O FL  01/15/2020  . IR THROMBECTOMY AV FISTULA W/THROMBOLYSIS/PTA INC/SHUNT/IMG RIGHT Right  12/26/2018  . IR THROMBECTOMY AV FISTULA W/THROMBOLYSIS/PTA INC/SHUNT/IMG RIGHT Right 08/10/2019  . IR THROMBECTOMY AV FISTULA W/THROMBOLYSIS/PTA/STENT INC/SHUNT/IMG RT Right 08/08/2018  . IR THROMBECTOMY AV FISTULA W/THROMBOLYSIS/PTA/STENT INC/SHUNT/IMG RT Right 05/15/2019  . IR US GUIDE VASC ACCESS RIGHT  08/08/2018  . IR US GUIDE VASC ACCESS RIGHT  12/26/2018  . IR US GUIDE VASC ACCESS RIGHT  05/15/2019  . IR US GUIDE VASC ACCESS RIGHT  08/10/2019  . IR US GUIDE VASC ACCESS RIGHT  10/09/2019  . PARS PLANA VITRECTOMY Left 11/24/2014   Procedure: PARS PLANA VITRECTOMY WITH 25 GAUGE;  Surgeon: Hurman Horn, MD;  Location: [place];  Service: Ophthalmology;  Laterality: Left;  . PERIPHERAL VASCULAR BALLOON ANGIOPLASTY Right 03/05/2018   Procedure: PERIPHERAL VASCULAR BALLOON ANGIOPLASTY;  Surgeon: Waynetta Sandy, MD;  Location: [place] CV LAB;  Service: Cardiovascular;  Laterality: Right;  arm fistula  . PERIPHERAL VASCULAR CATHETERIZATION N/A 04/28/2015   Procedure: Bilateral Upper Extremity Venography;  Surgeon: Conrad [place], MD;  Location: [place] [facility] LAB;  Service: Cardiovascular;  Laterality: N/A;  . PHOTOCOAGULATION WITH LASER Left 11/24/2014   Procedure: PHOTOCOAGULATION WITH LASER;  Surgeon: Hurman Horn, MD;  Location: [place];  Service: Ophthalmology;  Laterality: Left;  with insertion of silicone oil  . SAVORY DILATION N/A 10/02/2016   Procedure: SAVORY DILATION;  Surgeon: Danie Binder, MD;  [facility];  Service: Endoscopy;  Laterality: N/A;  . TUBAL LIGATION    . UPPER EXTREMITY VENOGRAPHY Bilateral 11/02/2019   Procedure: UPPER EXTREMITY VENOGRAPHY;  Surgeon: Waynetta Sandy, MD;  Location: [place] [facility];  Service: Cardiovascular;  Laterality: Bilateral;    FAMILY HISTORY Family History  Problem Relation Age of Onset  . COPD Mother   . Cancer Father   . Lymphoma Father   . Diabetes Sister   . Deep vein thrombosis Sister   . Diabetes Brother     . Hyperlipidemia Brother   . Hypertension Brother   . Mental retardation Sister   . Alcohol abuse Paternal Grandmother   . Colon cancer Neg Hx   . Liver disease Neg Hx     SOCIAL HISTORY Social History   Tobacco Use  .  Smoking status: Current Every Day Smoker    Packs/day: 1.00    Years: 23.00    Pack years: 23.00    Types: Cigarettes    Start date: 12/03/2000  . Smokeless tobacco: Never Used  . Tobacco comment: one pack daily  Vaping Use  . Vaping Use: Never used  Substance Use Topics  . Alcohol use: No    Alcohol/week: 0.0 standard drinks  . Drug use: No    Comment: Sober for 8 years         OPHTHALMIC EXAM:  Base Eye Exam    Visual Acuity (ETDRS)      Right Left   Dist [place] 20/200 NLP   Dist ph [place] 20/100 +1        Tonometry (Tonopen, 10:15 AM)      Right Left   Pressure 12 25       Tonometry #2 (Tonopen, 10:18 AM)      Right Left   Pressure  27       Pupils      Dark Light Shape React APD   Right 5 4 Round Brisk None   Left 8 8 Round Minimal None       Visual Fields (Counting fingers)      Left Right   Restrictions Total superior temporal, inferior temporal, superior nasal, inferior nasal deficiencies Total superior temporal, inferior temporal, superior nasal, inferior nasal deficiencies       Extraocular Movement      Right Left    Full Full       Neuro/Psych    Oriented x3: Yes   Mood/Affect: Normal       Dilation    Both eyes: 1.0% Mydriacyl, 2.5% Phenylephrine @ 10:18 AM        Slit Lamp and Fundus Exam    Slit Lamp Exam      Right Left   Lids/Lashes Normal Normal   Conjunctiva/Sclera White and quiet White and quiet   Cornea Clear Clear   Anterior Chamber Deep and quiet Deep and quiet, glaucoma tube AC   Iris Round and reactive Old and active NVI   Lens Posterior chamber intraocular lens Posterior chamber intraocular lens   Anterior Vitreous Dear Silicone       Fundus Exam      Right Left   Posterior Vitreous  Vitrectomized yet Clear silicone   Disc Old fibrovascular disease of the nerve, 3+ Pallor White nerve   C/D Ratio 0.3 Atrophic   Macula no macular thickening, Microaneurysms, no exudates, Focal laser scars Attached   Vessels PDR-quiet PDR-quiet   Periphery Laser scars, Laser scars,          IMAGING AND PROCEDURES  Imaging and Procedures for 04/27/20  OCT, Retina - OU - Both Eyes       Right Eye Quality was good. Scan locations included subfoveal. Central Foveal Thickness: 303. Progression has been stable.   Left Eye Quality was good. Scan locations included subfoveal. Central Foveal Thickness: 173. Progression has been stable.   Notes Chronic minor paracentral CSME OD, controlled on periodic, quarterly injections intravitreal Avastin.       Intravitreal Injection, Pharmacologic Agent - OD - Right Eye       Time Out 04/27/2020. 11:14 AM. Confirmed correct patient, procedure, site, and patient consented.   Anesthesia Anesthetic medications included [place] 3.5%, Proparacaine 0.5%.   Procedure Preparation included Tobramycin 0.3%, 10% betadine to eyelids. A supplied needle was used.   Injection:  1.25 mg  Bevacizumab (AVASTIN) SOLN   NDC: [address]-2, Lot: [id number]   Route: Intravitreal, Site: Right Eye, Waste: 0 mg  Post-op Post injection exam found visual acuity of at least counting fingers. The patient tolerated the procedure well. There were no complications. The patient received written and verbal post procedure care education. Post injection medications were not given.                 ASSESSMENT/PLAN:  Diabetic macular edema of right eye with proliferative retinopathy associated with type 2 diabetes mellitus (HCC) Minor perifoveal CSME, chronic and recurrent with PDR quiescent.  Moreover PDR associated with recurrent vitreous hemorrhages my likely microhemorrhages from anterior segment neovascularization which has been recurrent in the past.  These each have  been controlled by quarterly injection of intravitreal Avastin in this monocular patient.  We will repeat today      ICD-10-CM   1. Diabetic macular edema of right eye with proliferative retinopathy associated with type 2 diabetes mellitus (HCC)  E11.3511 OCT, Retina - OU - Both Eyes    Intravitreal Injection, Pharmacologic Agent - OD - Right Eye    Bevacizumab (AVASTIN) SOLN 1.25 mg    1.  Repeat intravitreal Avastin today to prevent recurrences of microvitreous hemorrhages as seen in the past as well as prevent progression of CSME with PDR  2.  3.  Ophthalmic Meds Ordered this visit:  Meds ordered this encounter  Medications  . Bevacizumab (AVASTIN) SOLN 1.25 mg       Return in about 3 months (around 07/27/2020) for DILATE OU, OCT.  Patient Instructions  Patient asked to report any profound changes in quality or quantity of vision    Explained the diagnoses, plan, and follow up with the patient and they expressed understanding.  Patient expressed understanding of the importance of proper follow up care.   Clent Demark Ahriyah Vannest M.D. Diseases & Surgery of the Retina and Vitreous Retina & Diabetic [place] 04/27/20     Abbreviations: M myopia (nearsighted); A astigmatism; H hyperopia (farsighted); P presbyopia; Mrx spectacle prescription;  CTL contact lenses; OD right eye; OS left eye; OU both eyes  XT exotropia; ET esotropia; PEK punctate epithelial keratitis; PEE punctate epithelial erosions; DES dry eye syndrome; MGD meibomian gland dysfunction; ATs artificial tears; PFAT's preservative free artificial tears; [place] nuclear sclerotic cataract; PSC posterior subcapsular cataract; ERM epi-retinal membrane; PVD posterior vitreous detachment; RD retinal detachment; DM diabetes mellitus; DR diabetic retinopathy; NPDR non-proliferative diabetic retinopathy; PDR proliferative diabetic retinopathy; CSME clinically significant macular edema; DME diabetic macular edema; dbh dot blot  hemorrhages; CWS cotton wool spot; POAG primary open angle glaucoma; C/D cup-to-disc ratio; HVF humphrey visual field; GVF goldmann visual field; OCT optical coherence tomography; IOP intraocular pressure; BRVO Branch retinal vein occlusion; CRVO central retinal vein occlusion; CRAO central retinal artery occlusion; BRAO branch retinal artery occlusion; RT retinal tear; SB scleral buckle; PPV pars plana vitrectomy; VH Vitreous hemorrhage; PRP panretinal laser photocoagulation; IVK intravitreal kenalog; VMT vitreomacular traction; MH Macular hole;  NVD neovascularization of the disc; NVE neovascularization elsewhere; AREDS age related eye disease study; ARMD age related macular degeneration; POAG primary open angle glaucoma; EBMD epithelial/anterior basement membrane dystrophy; ACIOL anterior chamber intraocular lens; IOL intraocular lens; PCIOL posterior chamber intraocular lens; Phaco/IOL phacoemulsification with intraocular lens placement; [place] photorefractive keratectomy; LASIK laser assisted in situ keratomileusis; HTN hypertension; DM diabetes mellitus; COPD chronic obstructive pulmonary disease

## 2020-04-27 NOTE — Assessment & Plan Note (Signed)
Minor perifoveal CSME, chronic and recurrent with PDR quiescent.  Moreover PDR associated with recurrent vitreous hemorrhages my likely microhemorrhages from anterior segment neovascularization which has been recurrent in the past.  These each have been controlled by quarterly injection of intravitreal Avastin in this monocular patient.  We will repeat today

## 2020-04-27 NOTE — Patient Instructions (Signed)
Patient asked to report any profound changes in quality or quantity of vision

## 2020-04-28 DIAGNOSIS — D631 Anemia in chronic kidney disease: Secondary | ICD-10-CM | POA: Diagnosis not present

## 2020-04-28 DIAGNOSIS — Z992 Dependence on renal dialysis: Secondary | ICD-10-CM | POA: Diagnosis not present

## 2020-04-28 DIAGNOSIS — N250 Renal osteodystrophy: Secondary | ICD-10-CM | POA: Diagnosis not present

## 2020-04-28 DIAGNOSIS — D509 Iron deficiency anemia, unspecified: Secondary | ICD-10-CM | POA: Diagnosis not present

## 2020-04-28 DIAGNOSIS — N186 End stage renal disease: Secondary | ICD-10-CM | POA: Diagnosis not present

## 2020-04-29 ENCOUNTER — Telehealth: Payer: Self-pay | Admitting: Gastroenterology

## 2020-04-29 ENCOUNTER — Other Ambulatory Visit: Payer: Self-pay

## 2020-04-29 ENCOUNTER — Encounter: Payer: Self-pay | Admitting: Gastroenterology

## 2020-04-29 ENCOUNTER — Ambulatory Visit (INDEPENDENT_AMBULATORY_CARE_PROVIDER_SITE_OTHER): Payer: Medicare Other | Admitting: Gastroenterology

## 2020-04-29 VITALS — BP 130/81 | HR 66 | Temp 97.7°F | Ht 63.0 in | Wt 191.0 lb

## 2020-04-29 DIAGNOSIS — K7469 Other cirrhosis of liver: Secondary | ICD-10-CM

## 2020-04-29 DIAGNOSIS — K219 Gastro-esophageal reflux disease without esophagitis: Secondary | ICD-10-CM

## 2020-04-29 DIAGNOSIS — K760 Fatty (change of) liver, not elsewhere classified: Secondary | ICD-10-CM | POA: Diagnosis not present

## 2020-04-29 DIAGNOSIS — K7460 Unspecified cirrhosis of liver: Secondary | ICD-10-CM | POA: Insufficient documentation

## 2020-04-29 NOTE — Patient Instructions (Signed)
1. Please have your labs and ultrasound done. We will contact you with results as available.  2. We will contact hematology about setting you up with another appointment.

## 2020-04-29 NOTE — Progress Notes (Signed)
Primary Care Physician: [facility]  Primary Gastroenterologist:  Elon Alas. Abbey Chatters, DO   Chief Complaint  Patient presents with  . Follow-up    doing well     HPI: Kara Knapp is a 49 y.o. female here for follow-up.  Last seen in February.  She has a history of GERD, esophageal dysphagia, diarrhea, hepatic fibrosis.  History of liver biopsy July 2019 noting stage II of 4 bridging fibrosis, marked secondary iron overload likely due to frequent iron infusions during dialysis.  Overdue for follow-up with hematology.  Abdominal ultrasound September 2020: Subtle surface nodularity, spleen upper limits of normal in size, consider cirrhosis in the proper clinical setting.    Clinically doing okay.  Continues with hemodialysis in [place] 3 days weekly.  Overall feels like her swallowing is stable.  She has the hardest times eating sandwiches.  Okay with pills, liquids.  Chews her meat finely.  Some of her limitation she believes is because of fluid restriction, can only consume 2 ounces of fluid at a time. She has some abdominal cramping at times during dialysis. Sometimes related to BMs. Right now stools are good.  She is not able to see color due to her visual impairment.  Denies heartburn.  Appetite is good.    Current Outpatient Medications  Medication Sig Dispense Refill  . acetaminophen (TYLENOL) 500 MG tablet Take 1,000 mg by mouth every 6 (six) hours as needed for moderate pain or headache.    . ARIPiprazole (ABILIFY) 5 MG tablet Take 1 tablet (5 mg total) by mouth daily. 90 tablet 0  . azelastine (ASTELIN) 0.1 % nasal spray Place 1 spray into both nostrils 2 (two) times daily. Use in each nostril as directed (Patient taking differently: Place 1 spray into both nostrils daily as needed for rhinitis. ) 30 mL 12  . carvedilol (COREG) 12.5 MG tablet Take 12.5 mg by mouth 2 (two) times daily.    . cholecalciferol (VITAMIN D) 1000 units tablet Take 1,000 Units by mouth  daily.     . COMBIGAN 0.2-0.5 % ophthalmic solution INSTILL ONE DROP IN EACH EYE TWICE DAILY 10 mL 0  . cyclobenzaprine (FLEXERIL) 5 MG tablet TAKE ONE TABLET BY MOUTH THREE TIMES DAILY AS NEEDED FOR MUSCLE SPASM 60 tablet 3  . diclofenac sodium (VOLTAREN) 1 % GEL Apply 2 g topically 4 (four) times daily. (Patient taking differently: Apply 2 g topically 4 (four) times daily as needed (pain). ) 200 g 3  . gabapentin (NEURONTIN) 100 MG capsule Take 1 capsule (100 mg total) by mouth 3 (three) times daily. (Needs to be seen before next refill) 90 capsule 0  . ibuprofen (ADVIL) 200 MG tablet Take 600 mg by mouth every 6 (six) hours as needed for headache or moderate pain.    Marland Kitchen LANTUS SOLOSTAR 100 UNIT/ML Solostar Pen INJECT 18-50 UNITS SUBCUTANEOUSLY AT BEDTIME. (Patient taking differently: Inject 20 Units into the skin at bedtime. ) 15 mL 0  . lidocaine-prilocaine (EMLA) cream Apply 1 application topically daily as needed (arm port).    Marland Kitchen linagliptin (TRADJENTA) 5 MG TABS tablet Take 1 tablet (5 mg total) by mouth daily. (Patient taking differently: Take 5 mg by mouth every evening. ) 90 tablet 3  . multivitamin (RENA-VIT) TABS tablet Take 1 tablet by mouth daily.    . pantoprazole (PROTONIX) 40 MG tablet TAKE 1 BY MOUTH DAILY 30 minutes before breakfast (Patient taking differently: Take 40 mg by mouth daily. )  90 tablet 3  . Probiotic Product (CULTURELLE PROBIOTICS PO) Take 1 capsule by mouth daily.     . sertraline (ZOLOFT) 100 MG tablet Take 1.5 tablets (150 mg total) by mouth daily. 135 tablet 0  . sevelamer carbonate (RENVELA) 800 MG tablet Take 800-2,400 mg by mouth See admin instructions. Take 2422m by mouth three times daily with means and 8027mwith snacks    . SYMBICORT 160-4.5 MCG/ACT inhaler     . traZODone (DESYREL) 50 MG tablet Take 0.5-1 tablets (25-50 mg total) by mouth at bedtime as needed. for sleep (Patient taking differently: Take 25 mg by mouth at bedtime as needed for sleep. ) 90  tablet 3  . VENTOLIN HFA 108 (90 Base) MCG/ACT inhaler     . Continuous Blood Gluc Sensor (FREESTYLE LIBRE 14 DAY SENSOR) MISC Inject 1 each into the skin every 14 (fourteen) days. Use as directed. 2 each 2  . glucose blood (PRODIGY NO CODING BLOOD GLUC) test strip USE TO CHECK BLOOD SUGAR TWICE DAILY. Dx E11.22 200 each 3  . Insulin Pen Needle (TRUEPLUS PEN NEEDLES) 31G X 5 MM MISC Use daily with insulin Dx E11.22 100 each 3  . PRODIGY TWIST TOP LANCETS 28G MISC USE TO CHECK BLOOD SUGAR UP TO FOUR TIMES DAILY. 100 each 1   No current facility-administered medications for this visit.    Allergies as of 04/29/2020 - Review Complete 04/29/2020  Allergen Reaction Noted  . Sulfamethoxazole-trimethoprim Nausea And Vomiting 07/13/2015  . Bactrim [sulfamethoxazole-trimethoprim] Nausea And Vomiting 05/21/2013  . Prednisone Other (See Comments) 07/09/2013    ROS:  General: Negative for anorexia, weight loss, fever, chills, fatigue, weakness. ENT: Negative for hoarseness, difficulty swallowing , nasal congestion. CV: Negative for chest pain, angina, palpitations, dyspnea on exertion, peripheral edema.  Respiratory: Negative for dyspnea at rest, dyspnea on exertion, cough, sputum, wheezing.  GI: See history of present illness. GU:  Negative for dysuria, hematuria, urinary incontinence, urinary frequency, nocturnal urination. On hemodialysis  Endo: Negative for unusual weight change.    Physical Examination:   BP 130/81   Pulse 66   Temp 97.7 F (36.5 C) (Temporal)   Ht 5' 3"  (1.6 m)   Wt 191 lb (86.6 kg)   LMP 05/05/2015   BMI 33.83 kg/m   General: chronically ill appearing female in nad.  Eyes: No icterus. Mouth: masked. Lungs: diffuse expiratory wheeze  Heart: Regular rate and rhythm, no murmurs rubs or gallops.  Abdomen: Bowel sounds are normal, nontender, nondistended, no hepatosplenomegaly or masses, no abdominal bruits or hernia , no rebound or guarding.   Extremities: No  lower extremity edema. No clubbing or deformities. Neuro: Alert and oriented x 4   Skin: Warm and dry, no jaundice.  Tanned appearance Psych: Alert and cooperative, normal mood and affect.  Labs:  Lab Results  Component Value Date   CREATININE 6.00 (H) 11/18/2019   BUN 33 (H) 11/18/2019   NA 137 11/18/2019   K 4.6 11/18/2019   CL 100 11/18/2019   CO2 19 (L) 10/09/2019   Lab Results  Component Value Date   ALT 13 05/08/2019   AST 15 05/08/2019   ALKPHOS 276 (H) 10/01/2018   BILITOT 0.3 05/08/2019   Lab Results  Component Value Date   WBC 7.4 08/10/2019   HGB 10.5 (L) 11/18/2019   HCT 31.0 (L) 11/18/2019   MCV 99.4 08/10/2019   PLT 172 08/10/2019   Lab Results  Component Value Date   IRON 40 05/04/2019  TIBC 368 05/04/2019   FERRITIN 391 (H) 05/04/2019    Imaging Studies: Intravitreal Injection, Pharmacologic Agent - OD - Right Eye  Result Date: 04/27/2020 Time Out 04/27/2020. 11:14 AM. Confirmed correct patient, procedure, site, and patient consented. Anesthesia Anesthetic medications included Akten 3.5%, Proparacaine 0.5%. Procedure Preparation included Tobramycin 0.3%, 10% betadine to eyelids. A supplied needle was used. Injection: 1.25 mg Bevacizumab (AVASTIN) SOLN   NDC: [address]-8, Lot: [id number]   Route: Intravitreal, Site: Right Eye, Waste: 0 mg Post-op Post injection exam found visual acuity of at least counting fingers. The patient tolerated the procedure well. There were no complications. The patient received written and verbal post procedure care education. Post injection medications were not given.   OCT, Retina - OU - Both Eyes  Result Date: 04/27/2020 Right Eye Quality was good. Scan locations included subfoveal. Central Foveal Thickness: 303. Progression has been stable. Left Eye Quality was good. Scan locations included subfoveal. Central Foveal Thickness: 173. Progression has been stable. Notes Chronic minor paracentral CSME OD, controlled on periodic,  quarterly injections intravitreal Avastin.   Impression/Plan:  Pleasant 49 year old female presenting for follow-up of GERD, hepatic fibrosis questionable early cirrhosis, chronic diarrhea.  She notes that she is overdue for follow-up with [place] as well as hematology whom she was seeing for secondary iron overload.  GERD is well controlled.  With regards to dysphagia, she has been managing.  No warning signs or symptoms.  EGD in 2018.  She was never able to complete manometry studies due to transportation limitations and at this time is not interested in pursuing further evaluation of her dysphagia.  Plan to continue pantoprazole 40 mg daily.  Hepatic fibrosis on liver biopsy: With notable secondary iron overload possibly related to iron infusions that she received during hemodialysis.  Overdue for follow-up with hematology.  We will make arrangements.  Update labs.  We will go ahead and update right upper quadrant ultrasound given possible early cirrhosis seen on ultrasound in 2020.  EGD in 2018 with no evidence of portal hypertension or esophageal varices.  Chronic diarrhea: No current issues.  Continue to monitor.

## 2020-04-29 NOTE — Telephone Encounter (Signed)
Can you please get her back in to see hematology for follow up of iron overload/elevated ferritin?

## 2020-04-30 DIAGNOSIS — Z992 Dependence on renal dialysis: Secondary | ICD-10-CM | POA: Diagnosis not present

## 2020-04-30 DIAGNOSIS — D631 Anemia in chronic kidney disease: Secondary | ICD-10-CM | POA: Diagnosis not present

## 2020-04-30 DIAGNOSIS — N250 Renal osteodystrophy: Secondary | ICD-10-CM | POA: Diagnosis not present

## 2020-04-30 DIAGNOSIS — N2581 Secondary hyperparathyroidism of renal origin: Secondary | ICD-10-CM | POA: Diagnosis not present

## 2020-04-30 DIAGNOSIS — N186 End stage renal disease: Secondary | ICD-10-CM | POA: Diagnosis not present

## 2020-04-30 DIAGNOSIS — Z23 Encounter for immunization: Secondary | ICD-10-CM | POA: Diagnosis not present

## 2020-05-02 ENCOUNTER — Telehealth: Payer: Self-pay | Admitting: *Deleted

## 2020-05-02 NOTE — Addendum Note (Signed)
Addended by: Cheron Every on: 05/02/2020 08:28 AM   Modules accepted: Orders

## 2020-05-02 NOTE — Telephone Encounter (Signed)
Referral sent 

## 2020-05-02 NOTE — Telephone Encounter (Signed)
Called pt to make aware of [place] appt information. She will also have aide call back to get this information  [place] scheduled for 10/8 at 7:30am, arrival 7:15am, npo midnight

## 2020-05-03 DIAGNOSIS — N186 End stage renal disease: Secondary | ICD-10-CM | POA: Diagnosis not present

## 2020-05-03 DIAGNOSIS — Z23 Encounter for immunization: Secondary | ICD-10-CM | POA: Diagnosis not present

## 2020-05-03 DIAGNOSIS — D631 Anemia in chronic kidney disease: Secondary | ICD-10-CM | POA: Diagnosis not present

## 2020-05-03 DIAGNOSIS — N2581 Secondary hyperparathyroidism of renal origin: Secondary | ICD-10-CM | POA: Diagnosis not present

## 2020-05-03 DIAGNOSIS — Z992 Dependence on renal dialysis: Secondary | ICD-10-CM | POA: Diagnosis not present

## 2020-05-03 DIAGNOSIS — N250 Renal osteodystrophy: Secondary | ICD-10-CM | POA: Diagnosis not present

## 2020-05-03 NOTE — Telephone Encounter (Signed)
Patient aid is aware

## 2020-05-04 ENCOUNTER — Ambulatory Visit (INDEPENDENT_AMBULATORY_CARE_PROVIDER_SITE_OTHER): Payer: Medicare Other | Admitting: Clinical

## 2020-05-04 ENCOUNTER — Other Ambulatory Visit: Payer: Self-pay

## 2020-05-04 DIAGNOSIS — F3341 Major depressive disorder, recurrent, in partial remission: Secondary | ICD-10-CM

## 2020-05-04 NOTE — Progress Notes (Signed)
  Virtual Visit via Telephone Note  I connected withViolet A Knapp on 05/04/20 at 11:00 AM EDT by telephoneand verified that I am speaking with the correct person using two identifiers.  Location: Patient:Home Provider:Office  I discussed the limitations of evaluation and management by telemedicine and the availability of in person appointments. The patient expressed understanding and agreed to proceed.    THERAPIST PROGRESS NOTE  Session Time:11:00AM-11:45AM  Participation Level:Active  Behavioral Response:CasualAlertDepressed  Type of Therapy:Individual Therapy  Treatment Goals addressed:Coping  Interventions:CBT  Summary:Kara A Berrieris a 49 y.o.femalewho presents with Depression.The OPT therapist worked with thepatientfor herongoing OPT treatmentsession. The OPT therapist utilized Motivational Interviewing to assist in creating therapeutic repore. The patient in the session was engaged and work in Science writer about hertriggers and symptoms over the past few weeks. The OPT therapist utilized Cognitive Behavioral Therapy through cognitive restructuring as well as worked with the patient on coping strategies to assist in management ofmood.The patient reportedthe shecontinues to work to [employer] worked with the [place] therapist in session on implementing positive thinking, self esteem, and staying active as well as being aware of and consistent in keeping her health care appointments.  Suicidal/Homicidal:Nowithout intent/plan  Therapist Response:The OPT therapist worked with the patient for the patients scheduled session. The patient was engaged in his session and gave feedback in relation to triggers, symptoms, and behavior responses over the pastfewweeks. The OPT therapist worked with the patient utilizing an in session Cognitive Behavioral Therapy exercise. The patient was responsive in the session and verbalized, "I  have changed how I interact with people and things I use to let get to me and set me off make me made and I could get into it with people, but not I just do not react that way I have been trying to not let things get to me and I have people I have had relationships with several years I think that speaks a lot about my character".The OPT therapist worked with the patient on implementing positive thinking and reviewing how this impacts mood and gave praise for her emotion control work.The OPT therapist will continue treatment work with the patient in hernext scheduled session  Plan: Return again in3weeks.  Diagnosis:Axis I:MDD (major depressive disorder), recurrent, in partial remission   Axis II:No diagnosis  I discussed the assessment and treatment plan with the patient. The patient was provided an opportunity to ask questions and all were answered. The patient agreed with the plan and demonstrated an understanding of the instructions.  The patient was advised to call back or seek an in-person evaluation if the symptoms worsen or if the condition fails to improve as anticipated.  I provided54mnutes of non-face-to-face time during this encounter.  TLennox Grumbles LCSW 05/04/2020

## 2020-05-05 DIAGNOSIS — N250 Renal osteodystrophy: Secondary | ICD-10-CM | POA: Diagnosis not present

## 2020-05-05 DIAGNOSIS — N2581 Secondary hyperparathyroidism of renal origin: Secondary | ICD-10-CM | POA: Diagnosis not present

## 2020-05-05 DIAGNOSIS — N186 End stage renal disease: Secondary | ICD-10-CM | POA: Diagnosis not present

## 2020-05-05 DIAGNOSIS — Z992 Dependence on renal dialysis: Secondary | ICD-10-CM | POA: Diagnosis not present

## 2020-05-05 DIAGNOSIS — Z23 Encounter for immunization: Secondary | ICD-10-CM | POA: Diagnosis not present

## 2020-05-05 DIAGNOSIS — D631 Anemia in chronic kidney disease: Secondary | ICD-10-CM | POA: Diagnosis not present

## 2020-05-06 ENCOUNTER — Other Ambulatory Visit: Payer: Self-pay

## 2020-05-06 ENCOUNTER — Inpatient Hospital Stay (HOSPITAL_COMMUNITY): Payer: Medicare Other | Attending: Hematology

## 2020-05-06 ENCOUNTER — Ambulatory Visit (HOSPITAL_COMMUNITY): Payer: Medicare Other

## 2020-05-06 DIAGNOSIS — N186 End stage renal disease: Secondary | ICD-10-CM | POA: Diagnosis not present

## 2020-05-06 DIAGNOSIS — R7989 Other specified abnormal findings of blood chemistry: Secondary | ICD-10-CM

## 2020-05-06 LAB — FOLATE: Folate: 27 ng/mL (ref >5.9)

## 2020-05-06 LAB — CBC WITH DIFFERENTIAL/PLATELET
Abs Immature Granulocytes: 0.03 10*3/uL (ref 0.00–0.07)
Basophils Absolute: 0.1 10*3/uL (ref 0.0–0.1)
Basophils Relative: 1 %
Eosinophils Absolute: 0.1 10*3/uL (ref 0.0–0.5)
Eosinophils Relative: 1 %
HCT: 33.3 % — ABNORMAL LOW (ref 36.0–46.0)
Hemoglobin: 10.8 g/dL — ABNORMAL LOW (ref 12.0–15.0)
Immature Granulocytes: 0 %
Lymphocytes Relative: 13 %
Lymphs Abs: 1.1 10*3/uL (ref 0.7–4.0)
MCH: 31 pg (ref 26.0–34.0)
MCHC: 32.4 g/dL (ref 30.0–36.0)
MCV: 95.7 fL (ref 80.0–100.0)
Monocytes Absolute: 0.7 10*3/uL (ref 0.1–1.0)
Monocytes Relative: 8 %
Neutro Abs: 6.8 10*3/uL (ref 1.7–7.7)
Neutrophils Relative %: 77 %
Platelets: 185 10*3/uL (ref 150–400)
RBC: 3.48 MIL/uL — ABNORMAL LOW (ref 3.87–5.11)
RDW: 15.7 % — ABNORMAL HIGH (ref 11.5–15.5)
WBC: 8.8 10*3/uL (ref 4.0–10.5)
nRBC: 0 % (ref 0.0–0.2)

## 2020-05-06 LAB — IRON AND TIBC
Iron: 82 ug/dL (ref 28–170)
Saturation Ratios: 21 % (ref 10.4–31.8)
TIBC: 396 ug/dL (ref 250–450)
UIBC: 314 ug/dL

## 2020-05-06 LAB — FERRITIN: Ferritin: 157 ng/mL (ref 11–307)

## 2020-05-06 LAB — VITAMIN B12: Vitamin B-12: 422 pg/mL (ref 180–914)

## 2020-05-07 DIAGNOSIS — N2581 Secondary hyperparathyroidism of renal origin: Secondary | ICD-10-CM | POA: Diagnosis not present

## 2020-05-07 DIAGNOSIS — Z23 Encounter for immunization: Secondary | ICD-10-CM | POA: Diagnosis not present

## 2020-05-07 DIAGNOSIS — N250 Renal osteodystrophy: Secondary | ICD-10-CM | POA: Diagnosis not present

## 2020-05-07 DIAGNOSIS — D631 Anemia in chronic kidney disease: Secondary | ICD-10-CM | POA: Diagnosis not present

## 2020-05-07 DIAGNOSIS — N186 End stage renal disease: Secondary | ICD-10-CM | POA: Diagnosis not present

## 2020-05-07 DIAGNOSIS — Z992 Dependence on renal dialysis: Secondary | ICD-10-CM | POA: Diagnosis not present

## 2020-05-09 ENCOUNTER — Ambulatory Visit (HOSPITAL_COMMUNITY): Payer: Medicare Other

## 2020-05-11 DIAGNOSIS — Z23 Encounter for immunization: Secondary | ICD-10-CM | POA: Diagnosis not present

## 2020-05-11 DIAGNOSIS — N186 End stage renal disease: Secondary | ICD-10-CM | POA: Diagnosis not present

## 2020-05-11 DIAGNOSIS — N2581 Secondary hyperparathyroidism of renal origin: Secondary | ICD-10-CM | POA: Diagnosis not present

## 2020-05-11 DIAGNOSIS — D631 Anemia in chronic kidney disease: Secondary | ICD-10-CM | POA: Diagnosis not present

## 2020-05-11 DIAGNOSIS — Z992 Dependence on renal dialysis: Secondary | ICD-10-CM | POA: Diagnosis not present

## 2020-05-11 DIAGNOSIS — N250 Renal osteodystrophy: Secondary | ICD-10-CM | POA: Diagnosis not present

## 2020-05-13 ENCOUNTER — Inpatient Hospital Stay (HOSPITAL_COMMUNITY): Payer: Medicare Other | Admitting: Oncology

## 2020-05-13 NOTE — Progress Notes (Deleted)
Kara Knapp, [place] [id number]   CLINIC:  Medical Oncology/Hematology  PCP:  Medicine, [facility] [place] [address] [phone]   REASON FOR VISIT:  Follow-up for elevated ferritin levels.  CURRENT THERAPY: Observation.   INTERVAL HISTORY:  Kara Knapp 49 y.o. female seen for follow-up of elevated ferritin levels.  Denies any iron infusion in the last 1 year.  Denies any blood transfusions.  Reports appetite of 100% and energy levels are 50%.  Trouble swallowing at times is stable.  Sleep difficulties also stable.  Occasional headaches have been reported and unchanged.  Denies any fevers, night sweats or weight loss in the last 6 months.  Denies any joint pains.  Denies any nausea, vomiting, diarrhea or constipation.    REVIEW OF SYSTEMS:  Review of Systems  HENT:   Positive for trouble swallowing.   Neurological: Positive for headaches.  Psychiatric/Behavioral: Positive for sleep disturbance.  All other systems reviewed and are negative.    PAST MEDICAL/SURGICAL HISTORY:  Past Medical History:  Diagnosis Date  . Anemia of chronic disease   . Anxiety   . Asthma   . Blind left eye   . Bronchitis   . Cataract   . Cholecystitis, acute 05/26/2013   Status post cholecystectomy  . Chronic abdominal pain   . Chronic diarrhea   . COPD (chronic obstructive pulmonary disease) ([place])   . Depression   . Diabetic foot ulcer ([place]) 03/01/2015  . Diastolic heart failure ([place])   . ESRD on hemodialysis ([place])    Started diaylsis 12/29/15  . Essential hypertension   . Fibroids   . Glaucoma   . History of blood transfusion   . History of pneumonia   . Hyperlipidemia   . Insulin-dependent diabetes mellitus with retinopathy   . Liver fibrosis    Negative Hep B surface antigen, negative Hep C antibody Feb 2018 (see scanned in labs).  . Neuropathy   . Osteomyelitis ([place])    Toe on left foot   Past Surgical History:  Procedure  Laterality Date  . A/V SHUNTOGRAM N/A 10/25/2016   Procedure: A/V Shuntogram - Right Arm;  Surgeon: Waynetta Sandy, MD;  Location: [facility];  Service: Cardiovascular;  Laterality: N/A;  . A/V SHUNTOGRAM N/A 03/05/2018   Procedure: A/V SHUNTOGRAM - Right Arm;  Surgeon: Waynetta Sandy, MD;  Location: [facility];  Service: Cardiovascular;  Laterality: N/A;  . AV FISTULA PLACEMENT Right 10/17/2015   Procedure: INSERTION OF ARTERIOVENOUS GORE-TEX GRAFT RIGHT UPPER ARM WITH ACUSEAL;  Surgeon: Conrad Homewood, MD;  Location: [facility];  Service: Vascular;  Laterality: Right;  . AV FISTULA PLACEMENT Left 11/18/2019   Procedure: INSERTION OF ARTERIOVENOUS (AV) GORE-TEX GRAFT left  ARM;  Surgeon: Serafina Mitchell, MD;  Location: [facility];  Service: Vascular;  Laterality: Left;  . CATARACT EXTRACTION W/ INTRAOCULAR LENS IMPLANT Bilateral   . CESAREAN SECTION    . CHOLECYSTECTOMY N/A 05/25/2013   Procedure: LAPAROSCOPIC CHOLECYSTECTOMY;  Surgeon: Jamesetta So, MD;  Location: [facility];  Service: General;  Laterality: N/A;  . COLONOSCOPY WITH PROPOFOL N/A 10/02/2016   two 3 to 5 mm polyps in the descending colon, three 2 to 3 mm polyps in the rectum, random colon biopsies, rectal bleeding due to internal hemorrhoids, friability with no bleeding at the anus status post biopsy.  Surgical pathology found the polyps to be a mix of hyperplastic and tubular adenoma, random  colon biopsies to be benign colonic mucosa, and the anal biopsies to be anal skin tag.   Marland Kitchen ESOPHAGOGASTRODUODENOSCOPY (EGD) WITH PROPOFOL N/A 10/02/2016   mucosal nodule in the esophagus status post biopsy, moderate gastritis status post biopsy, mild duodenitis. esophageal biopsy to be benign, gastric biopsies to be gastritis due to aspirin use, and duodenal biopsies to be duodenitis due to aspirin use  . EYE SURGERY Bilateral   . IR FLUORO GUIDE CV LINE RIGHT  10/09/2019  . IR REMOVAL TUN CV CATH W/O FL  01/15/2020  . IR  THROMBECTOMY AV FISTULA W/THROMBOLYSIS/PTA INC/SHUNT/IMG RIGHT Right 12/26/2018  . IR THROMBECTOMY AV FISTULA W/THROMBOLYSIS/PTA INC/SHUNT/IMG RIGHT Right 08/10/2019  . IR THROMBECTOMY AV FISTULA W/THROMBOLYSIS/PTA/STENT INC/SHUNT/IMG RT Right 08/08/2018  . IR THROMBECTOMY AV FISTULA W/THROMBOLYSIS/PTA/STENT INC/SHUNT/IMG RT Right 05/15/2019  . IR US GUIDE VASC ACCESS RIGHT  08/08/2018  . IR US GUIDE VASC ACCESS RIGHT  12/26/2018  . IR US GUIDE VASC ACCESS RIGHT  05/15/2019  . IR US GUIDE VASC ACCESS RIGHT  08/10/2019  . IR US GUIDE VASC ACCESS RIGHT  10/09/2019  . PARS PLANA VITRECTOMY Left 11/24/2014   Procedure: PARS PLANA VITRECTOMY WITH 25 GAUGE;  Surgeon: Hurman Horn, MD;  Location: [facility];  Service: Ophthalmology;  Laterality: Left;  . PERIPHERAL VASCULAR BALLOON ANGIOPLASTY Right 03/05/2018   Procedure: PERIPHERAL VASCULAR BALLOON ANGIOPLASTY;  Surgeon: Waynetta Sandy, MD;  Location: [facility];  Service: Cardiovascular;  Laterality: Right;  arm fistula  . PERIPHERAL VASCULAR CATHETERIZATION N/A 04/28/2015   Procedure: Bilateral Upper Extremity Venography;  Surgeon: Conrad , MD;  Location: [facility];  Service: Cardiovascular;  Laterality: N/A;  . PHOTOCOAGULATION WITH LASER Left 11/24/2014   Procedure: PHOTOCOAGULATION WITH LASER;  Surgeon: Hurman Horn, MD;  Location: [facility];  Service: Ophthalmology;  Laterality: Left;  with insertion of silicone oil  . SAVORY DILATION N/A 10/02/2016   Procedure: SAVORY DILATION;  Surgeon: Danie Binder, MD;  [facility];  Service: Endoscopy;  Laterality: N/A;  . TUBAL LIGATION    . UPPER EXTREMITY VENOGRAPHY Bilateral 11/02/2019   Procedure: UPPER EXTREMITY VENOGRAPHY;  Surgeon: Waynetta Sandy, MD;  Location: [facility];  Service: Cardiovascular;  Laterality: Bilateral;     SOCIAL HISTORY:  Social History   Socioeconomic History  . Marital status: Single    Spouse name: Not on file  . Number  of children: 5  . Years of education: GED  . Highest education level: Not on file  Occupational History  . Not on file  Tobacco Use  . Smoking status: Current Every Day Smoker    Packs/day: 1.00    Years: 23.00    Pack years: 23.00    Types: Cigarettes    Start date: 12/03/2000  . Smokeless tobacco: Never Used  . Tobacco comment: one pack daily  Vaping Use  . Vaping Use: Never used  Substance and Sexual Activity  . Alcohol use: No    Alcohol/week: 0.0 standard drinks  . Drug use: No    Comment: Sober for 8 years  . Sexual activity: Not Currently    Birth control/protection: Surgical  Other Topics Concern  . Not on file  Social History Narrative  . Not on file   Social Determinants of Health   Financial Resource Strain:   . Difficulty of Paying Living Expenses: Not on file  Food Insecurity:   . Worried About Charity fundraiser in the Last  Year: Not on file  . Ran Out of Food in the Last Year: Not on file  Transportation Needs:   . Lack of Transportation (Medical): Not on file  . Lack of Transportation (Non-Medical): Not on file  Physical Activity:   . Days of Exercise per Week: Not on file  . Minutes of Exercise per Session: Not on file  Stress:   . Feeling of Stress : Not on file  Social Connections:   . Frequency of Communication with Friends and Family: Not on file  . Frequency of Social Gatherings with Friends and Family: Not on file  . Attends Religious Services: Not on file  . Active Member of Clubs or Organizations: Not on file  . Attends Archivist Meetings: Not on file  . Marital Status: Not on file  Intimate Partner Violence:   . Fear of Current or Ex-Partner: Not on file  . Emotionally Abused: Not on file  . Physically Abused: Not on file  . Sexually Abused: Not on file    FAMILY HISTORY:  Family History  Problem Relation Age of Onset  . COPD Mother   . Cancer Father   . Lymphoma Father   . Diabetes Sister   . Deep vein thrombosis  Sister   . Diabetes Brother   . Hyperlipidemia Brother   . Hypertension Brother   . Mental retardation Sister   . Alcohol abuse Paternal Grandmother   . Colon cancer Neg Hx   . Liver disease Neg Hx     CURRENT MEDICATIONS:  Outpatient Encounter Medications as of 05/13/2020  Medication Sig  . acetaminophen (TYLENOL) 500 MG tablet Take 1,000 mg by mouth every 6 (six) hours as needed for moderate pain or headache.  . ARIPiprazole (ABILIFY) 5 MG tablet Take 1 tablet (5 mg total) by mouth daily.  Marland Kitchen azelastine (ASTELIN) 0.1 % nasal spray Place 1 spray into both nostrils 2 (two) times daily. Use in each nostril as directed (Patient taking differently: Place 1 spray into both nostrils daily as needed for rhinitis. )  . carvedilol (COREG) 12.5 MG tablet Take 12.5 mg by mouth 2 (two) times daily.  . cholecalciferol (VITAMIN D) 1000 units tablet Take 1,000 Units by mouth daily.   . COMBIGAN 0.2-0.5 % ophthalmic solution INSTILL ONE DROP IN [facility] EYE TWICE DAILY  . Continuous Blood Gluc Sensor (FREESTYLE LIBRE 14 DAY SENSOR) MISC Inject 1 each into the skin every 14 (fourteen) days. Use as directed.  . cyclobenzaprine (FLEXERIL) 5 MG tablet TAKE ONE TABLET BY MOUTH THREE TIMES DAILY AS NEEDED FOR MUSCLE SPASM  . diclofenac sodium (VOLTAREN) 1 % GEL Apply 2 g topically 4 (four) times daily. (Patient taking differently: Apply 2 g topically 4 (four) times daily as needed (pain). )  . gabapentin (NEURONTIN) 100 MG capsule Take 1 capsule (100 mg total) by mouth 3 (three) times daily. (Needs to be seen before next refill)  . glucose blood (PRODIGY NO CODING BLOOD GLUC) test strip USE TO CHECK BLOOD SUGAR TWICE DAILY. Dx E11.22  . ibuprofen (ADVIL) 200 MG tablet Take 600 mg by mouth every 6 (six) hours as needed for headache or moderate pain.  . Insulin Pen Needle (TRUEPLUS PEN NEEDLES) 31G X 5 MM MISC Use daily with insulin Dx E11.22  . LANTUS SOLOSTAR 100 UNIT/ML Solostar Pen INJECT 18-50 UNITS  SUBCUTANEOUSLY AT BEDTIME. (Patient taking differently: Inject 20 Units into the skin at bedtime. )  . lidocaine-prilocaine (EMLA) cream Apply 1 application topically daily  as needed (arm port).  Marland Kitchen linagliptin (TRADJENTA) 5 MG TABS tablet Take 1 tablet (5 mg total) by mouth daily. (Patient taking differently: Take 5 mg by mouth every evening. )  . multivitamin (RENA-VIT) TABS tablet Take 1 tablet by mouth daily.  . pantoprazole (PROTONIX) 40 MG tablet TAKE 1 BY MOUTH DAILY 30 minutes before breakfast (Patient taking differently: Take 40 mg by mouth daily. )  . Probiotic Product (CULTURELLE PROBIOTICS PO) Take 1 capsule by mouth daily.   Marland Kitchen PRODIGY TWIST TOP LANCETS 28G MISC USE TO CHECK BLOOD SUGAR UP TO FOUR TIMES DAILY.  Marland Kitchen sertraline (ZOLOFT) 100 MG tablet Take 1.5 tablets (150 mg total) by mouth daily.  . sevelamer carbonate (RENVELA) 800 MG tablet Take 800-2,400 mg by mouth See admin instructions. Take 2448m by mouth three times daily with means and 8037mwith snacks  . SYMBICORT 160-4.5 MCG/ACT inhaler   . traZODone (DESYREL) 50 MG tablet Take 0.5-1 tablets (25-50 mg total) by mouth at bedtime as needed. for sleep (Patient taking differently: Take 25 mg by mouth at bedtime as needed for sleep. )  . VENTOLIN HFA 108 (90 Base) MCG/ACT inhaler    No facility-administered encounter medications on file as of 05/13/2020.    ALLERGIES:  Allergies  Allergen Reactions  . Sulfamethoxazole-Trimethoprim Nausea And Vomiting  . Bactrim [Sulfamethoxazole-Trimethoprim] Nausea And Vomiting  . Prednisone Other (See Comments)    "I was wide open and couldn't eat" per pt. Loss of appetite and insomnia  LOSS OF APPETITE,UNABLE TO SLEEP     PHYSICAL EXAM:  ECOG Performance status: 1  There were no vitals filed for this visit. There were no vitals filed for this visit.  Physical Exam Vitals reviewed.  Constitutional:      Appearance: Normal appearance.  Cardiovascular:     Rate and Rhythm: Normal  rate and regular rhythm.     Heart sounds: Normal heart sounds.  Pulmonary:     Effort: Pulmonary effort is normal.     Breath sounds: Normal breath sounds.  Abdominal:     General: There is no distension.     Palpations: Abdomen is soft. There is no mass.  Skin:    General: Skin is warm.  Neurological:     General: No focal deficit present.     Mental Status: She is alert and oriented to person, place, and time.  Psychiatric:        Mood and Affect: Mood normal.        Behavior: Behavior normal.      LABORATORY DATA:  I have reviewed the labs as listed.  CBC    Component Value Date/Time   WBC 8.8 05/06/2020 0841   RBC 3.48 (L) 05/06/2020 0841   HGB 10.8 (L) 05/06/2020 0841   HGB 12.1 05/14/2018 1357   HCT 33.3 (L) 05/06/2020 0841   HCT 35.9 05/14/2018 1357   PLT 185 05/06/2020 0841   PLT 171 05/14/2018 1357   MCV 95.7 05/06/2020 0841   MCV 99 (H) 05/14/2018 1357   MCH 31.0 05/06/2020 0841   MCHC 32.4 05/06/2020 0841   RDW 15.7 (H) 05/06/2020 0841   RDW 12.6 05/14/2018 1357   LYMPHSABS 1.1 05/06/2020 0841   LYMPHSABS 1.1 05/14/2018 1357   MONOABS 0.7 05/06/2020 0841   EOSABS 0.1 05/06/2020 0841   EOSABS 0.1 05/14/2018 1357   BASOSABS 0.1 05/06/2020 0841   BASOSABS 0.1 05/14/2018 1357   CMP Latest Ref Rng & Units 11/18/2019 11/02/2019 10/09/2019  Glucose 70 -  99 mg/dL 131(H) 241(H) 95  BUN 6 - 20 mg/dL 33(H) 45(H) 97(H)  Creatinine 0.44 - 1.00 mg/dL 6.00(H) 8.50(H) 12.16(H)  Sodium 135 - 145 mmol/L 137 137 136  Potassium 3.5 - 5.1 mmol/L 4.6 4.6 6.7(HH)  Chloride 98 - 111 mmol/L 100 100 103  CO2 22 - 32 mmol/L - - 19(L)  Calcium 8.9 - 10.3 mg/dL - - 9.1  Total Protein 6.1 - 8.1 g/dL - - -  Total Bilirubin 0.2 - 1.2 mg/dL - - -  Alkaline Phos 38 - 126 U/L - - -  AST 10 - 35 U/L - - -  ALT 6 - 29 U/L - - -       DIAGNOSTIC IMAGING:  I have independently reviewed the scans and discussed with the patient.      ASSESSMENT & PLAN:   No problem-specific  Assessment & Plan notes found for this encounter.      Orders placed this encounter:  No orders of the defined types were placed in this encounter.     Derek Jack, MD [facility] [phone]

## 2020-05-14 DIAGNOSIS — N2581 Secondary hyperparathyroidism of renal origin: Secondary | ICD-10-CM | POA: Diagnosis not present

## 2020-05-14 DIAGNOSIS — N250 Renal osteodystrophy: Secondary | ICD-10-CM | POA: Diagnosis not present

## 2020-05-14 DIAGNOSIS — Z992 Dependence on renal dialysis: Secondary | ICD-10-CM | POA: Diagnosis not present

## 2020-05-14 DIAGNOSIS — Z23 Encounter for immunization: Secondary | ICD-10-CM | POA: Diagnosis not present

## 2020-05-14 DIAGNOSIS — N186 End stage renal disease: Secondary | ICD-10-CM | POA: Diagnosis not present

## 2020-05-14 DIAGNOSIS — D631 Anemia in chronic kidney disease: Secondary | ICD-10-CM | POA: Diagnosis not present

## 2020-05-17 ENCOUNTER — Other Ambulatory Visit (INDEPENDENT_AMBULATORY_CARE_PROVIDER_SITE_OTHER): Payer: Self-pay | Admitting: Ophthalmology

## 2020-05-17 DIAGNOSIS — Z992 Dependence on renal dialysis: Secondary | ICD-10-CM | POA: Diagnosis not present

## 2020-05-17 DIAGNOSIS — N186 End stage renal disease: Secondary | ICD-10-CM | POA: Diagnosis not present

## 2020-05-17 DIAGNOSIS — D631 Anemia in chronic kidney disease: Secondary | ICD-10-CM | POA: Diagnosis not present

## 2020-05-17 DIAGNOSIS — Z23 Encounter for immunization: Secondary | ICD-10-CM | POA: Diagnosis not present

## 2020-05-17 DIAGNOSIS — N2581 Secondary hyperparathyroidism of renal origin: Secondary | ICD-10-CM | POA: Diagnosis not present

## 2020-05-17 DIAGNOSIS — N250 Renal osteodystrophy: Secondary | ICD-10-CM | POA: Diagnosis not present

## 2020-05-19 DIAGNOSIS — N186 End stage renal disease: Secondary | ICD-10-CM | POA: Diagnosis not present

## 2020-05-19 DIAGNOSIS — Z992 Dependence on renal dialysis: Secondary | ICD-10-CM | POA: Diagnosis not present

## 2020-05-19 DIAGNOSIS — Z23 Encounter for immunization: Secondary | ICD-10-CM | POA: Diagnosis not present

## 2020-05-19 DIAGNOSIS — N2581 Secondary hyperparathyroidism of renal origin: Secondary | ICD-10-CM | POA: Diagnosis not present

## 2020-05-19 DIAGNOSIS — N250 Renal osteodystrophy: Secondary | ICD-10-CM | POA: Diagnosis not present

## 2020-05-19 DIAGNOSIS — D631 Anemia in chronic kidney disease: Secondary | ICD-10-CM | POA: Diagnosis not present

## 2020-05-21 DIAGNOSIS — D631 Anemia in chronic kidney disease: Secondary | ICD-10-CM | POA: Diagnosis not present

## 2020-05-21 DIAGNOSIS — Z23 Encounter for immunization: Secondary | ICD-10-CM | POA: Diagnosis not present

## 2020-05-21 DIAGNOSIS — Z992 Dependence on renal dialysis: Secondary | ICD-10-CM | POA: Diagnosis not present

## 2020-05-21 DIAGNOSIS — N250 Renal osteodystrophy: Secondary | ICD-10-CM | POA: Diagnosis not present

## 2020-05-21 DIAGNOSIS — N2581 Secondary hyperparathyroidism of renal origin: Secondary | ICD-10-CM | POA: Diagnosis not present

## 2020-05-21 DIAGNOSIS — N186 End stage renal disease: Secondary | ICD-10-CM | POA: Diagnosis not present

## 2020-05-24 DIAGNOSIS — F17210 Nicotine dependence, cigarettes, uncomplicated: Secondary | ICD-10-CM | POA: Diagnosis not present

## 2020-05-24 DIAGNOSIS — Z6833 Body mass index (BMI) 33.0-33.9, adult: Secondary | ICD-10-CM | POA: Diagnosis not present

## 2020-05-24 DIAGNOSIS — Z992 Dependence on renal dialysis: Secondary | ICD-10-CM | POA: Diagnosis not present

## 2020-05-24 DIAGNOSIS — Z20822 Contact with and (suspected) exposure to covid-19: Secondary | ICD-10-CM | POA: Diagnosis not present

## 2020-05-24 DIAGNOSIS — Z299 Encounter for prophylactic measures, unspecified: Secondary | ICD-10-CM | POA: Diagnosis not present

## 2020-05-24 DIAGNOSIS — N186 End stage renal disease: Secondary | ICD-10-CM | POA: Diagnosis not present

## 2020-05-25 ENCOUNTER — Other Ambulatory Visit: Payer: Self-pay

## 2020-05-25 ENCOUNTER — Ambulatory Visit (INDEPENDENT_AMBULATORY_CARE_PROVIDER_SITE_OTHER): Payer: Medicare Other | Admitting: Clinical

## 2020-05-25 DIAGNOSIS — D631 Anemia in chronic kidney disease: Secondary | ICD-10-CM | POA: Diagnosis not present

## 2020-05-25 DIAGNOSIS — Z992 Dependence on renal dialysis: Secondary | ICD-10-CM | POA: Diagnosis not present

## 2020-05-25 DIAGNOSIS — F3341 Major depressive disorder, recurrent, in partial remission: Secondary | ICD-10-CM

## 2020-05-25 DIAGNOSIS — Z23 Encounter for immunization: Secondary | ICD-10-CM | POA: Diagnosis not present

## 2020-05-25 DIAGNOSIS — N2581 Secondary hyperparathyroidism of renal origin: Secondary | ICD-10-CM | POA: Diagnosis not present

## 2020-05-25 DIAGNOSIS — N186 End stage renal disease: Secondary | ICD-10-CM | POA: Diagnosis not present

## 2020-05-25 DIAGNOSIS — N250 Renal osteodystrophy: Secondary | ICD-10-CM | POA: Diagnosis not present

## 2020-05-25 NOTE — Progress Notes (Signed)
  Virtual Visit via Telephone Note  I connected withViolet A Berrieron 10/27/21at 11:00 AM EDTby telephoneand verified that I am speaking with the correct person using two identifiers.  Location: Patient:Home Provider:Office  I discussed the limitations of evaluation and management by telemedicine and the availability of in person appointments. The patient expressed understanding and agreed to proceed.    THERAPIST PROGRESS NOTE  Session Time:10:00AM-10:20AM  Participation Level:Active  Behavioral Response:CasualAlertDepressed  Type of Therapy:Individual Therapy  Treatment Goals addressed:Coping  Interventions:CBT  Summary:Kara A Berrieris a 49 y.o.femalewho presents with Depression.The OPT therapist worked with thepatientfor herongoing OPT treatmentsession. The OPT therapist utilized Motivational Interviewing to assist in creating therapeutic repore. The patient in the session was engaged and work in Science writer about hertriggers and symptoms over the past few weeks including her re-connected interaction with a long time friend, adjusting to her living situation, ongoing health concerns, and working on smoking cessation. The OPT therapist utilized Cognitive Behavioral Therapy through cognitive restructuring as well as worked with the patient on coping strategies to assist in management ofmood and the patient has been working to be active and socially involved.The patient reportedthe shecontinues to work on implementing positive thinking, self esteem, and staying active as well as being aware of and consistent in keeping her health care appointments. The patient is currently work on smoking cessation and verbalized considering taking up crocheting.  Suicidal/Homicidal:Nowithout intent/plan  Therapist Response:The OPT therapist worked with the patient for the patients scheduled session. The patient was engaged in his  session and gave feedback in relation to triggers, symptoms, and behavior responses over the pastfewweeks. The OPT therapist worked with the patient utilizing an in session Cognitive Behavioral Therapy exercise. The patient was responsive in the session and verbalized, "I am going to work on stopping smoking I am just doing it now because of boredom and I know its better for my health if I stop, so I am thinking about getting back into crocheting so I can stay busy and this will be a distraction".The OPT therapist worked with the patient on implementing positive thinking and reviewing how this impacts mood and gave praise for her individual goal of working on smoking cessation to improve her physical health.The OPT therapist will continue treatment work with the patient in hernext scheduled session  Plan: Return again in3weeks.  Diagnosis:Axis I:MDD (major depressive disorder), recurrent, in partial remission   Axis II:No diagnosis  I discussed the assessment and treatment plan with the patient. The patient was provided an opportunity to ask questions and all were answered. The patient agreed with the plan and demonstrated an understanding of the instructions.  The patient was advised to call back or seek an in-person evaluation if the symptoms worsen or if the condition fails to improve as anticipated.  I provided70mnutes of non-face-to-face time during this encounter.  TLennox Grumbles LCSW 05/25/2020

## 2020-05-27 ENCOUNTER — Ambulatory Visit (HOSPITAL_COMMUNITY): Payer: Medicare Other

## 2020-05-27 DIAGNOSIS — F319 Bipolar disorder, unspecified: Secondary | ICD-10-CM | POA: Diagnosis not present

## 2020-05-27 DIAGNOSIS — F17210 Nicotine dependence, cigarettes, uncomplicated: Secondary | ICD-10-CM | POA: Diagnosis not present

## 2020-05-27 DIAGNOSIS — E119 Type 2 diabetes mellitus without complications: Secondary | ICD-10-CM | POA: Diagnosis not present

## 2020-05-27 DIAGNOSIS — J449 Chronic obstructive pulmonary disease, unspecified: Secondary | ICD-10-CM | POA: Diagnosis not present

## 2020-05-27 DIAGNOSIS — Z299 Encounter for prophylactic measures, unspecified: Secondary | ICD-10-CM | POA: Diagnosis not present

## 2020-05-27 DIAGNOSIS — R059 Cough, unspecified: Secondary | ICD-10-CM | POA: Diagnosis not present

## 2020-05-27 DIAGNOSIS — J069 Acute upper respiratory infection, unspecified: Secondary | ICD-10-CM | POA: Diagnosis not present

## 2020-05-27 DIAGNOSIS — I10 Essential (primary) hypertension: Secondary | ICD-10-CM | POA: Diagnosis not present

## 2020-05-28 DIAGNOSIS — D631 Anemia in chronic kidney disease: Secondary | ICD-10-CM | POA: Diagnosis not present

## 2020-05-28 DIAGNOSIS — Z23 Encounter for immunization: Secondary | ICD-10-CM | POA: Diagnosis not present

## 2020-05-28 DIAGNOSIS — N186 End stage renal disease: Secondary | ICD-10-CM | POA: Diagnosis not present

## 2020-05-28 DIAGNOSIS — N250 Renal osteodystrophy: Secondary | ICD-10-CM | POA: Diagnosis not present

## 2020-05-28 DIAGNOSIS — N2581 Secondary hyperparathyroidism of renal origin: Secondary | ICD-10-CM | POA: Diagnosis not present

## 2020-05-28 DIAGNOSIS — Z992 Dependence on renal dialysis: Secondary | ICD-10-CM | POA: Diagnosis not present

## 2020-05-29 DIAGNOSIS — Z992 Dependence on renal dialysis: Secondary | ICD-10-CM | POA: Diagnosis not present

## 2020-05-29 DIAGNOSIS — N186 End stage renal disease: Secondary | ICD-10-CM | POA: Diagnosis not present

## 2020-05-30 ENCOUNTER — Inpatient Hospital Stay (HOSPITAL_COMMUNITY): Payer: Medicare Other | Attending: Oncology | Admitting: Oncology

## 2020-05-30 ENCOUNTER — Other Ambulatory Visit: Payer: Self-pay

## 2020-05-30 DIAGNOSIS — R7989 Other specified abnormal findings of blood chemistry: Secondary | ICD-10-CM

## 2020-05-30 DIAGNOSIS — Z992 Dependence on renal dialysis: Secondary | ICD-10-CM | POA: Diagnosis not present

## 2020-05-30 DIAGNOSIS — E1122 Type 2 diabetes mellitus with diabetic chronic kidney disease: Secondary | ICD-10-CM | POA: Diagnosis not present

## 2020-05-30 DIAGNOSIS — N186 End stage renal disease: Secondary | ICD-10-CM | POA: Insufficient documentation
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
## 2020-05-30 NOTE — Progress Notes (Signed)
[place], [place] [address]   CLINIC:  Medical Oncology/Hematology  PCP:  Medicine, [facility] [place] [address] [phone]   REASON FOR VISIT:  Follow-up for elevated ferritin levels.  CURRENT THERAPY: Observation.   INTERVAL HISTORY:  [name] 49 y.o. female seen for follow-up of elevated ferritin levels.  She was initially evaluated by Dr. Ishmael Holter back in September 2019.  She previously was worked up and was negative for hemochromatosis.  She has received iron infusions in the past but none recently.  She denies any recent blood transfusions.  Her appetite is stable.  Her energy levels are low.  Denies any fevers, night sweats or weight loss in the past 6 months.  She is followed by nephrology for end-stage renal disease is currently on dialysis.  She has chronic cough, nausea and dysphagia.  Has anxiety and depression.   REVIEW OF SYSTEMS:  Review of Systems  HENT:   Positive for trouble swallowing.   Neurological: Positive for headaches.  Psychiatric/Behavioral: Positive for sleep disturbance.  All other systems reviewed and are negative.    PAST MEDICAL/SURGICAL HISTORY:  Past Medical History:  Diagnosis Date  . Anemia of chronic disease   . Anxiety   . Asthma   . Blind left eye   . Bronchitis   . Cataract   . Cholecystitis, acute 05/26/2013   Status post cholecystectomy  . Chronic abdominal pain   . Chronic diarrhea   . COPD (chronic obstructive pulmonary disease) ([place])   . Depression   . Diabetic foot ulcer ([place]) 03/01/2015  . Diastolic heart failure ([place])   . ESRD on hemodialysis ([place])    Started diaylsis 12/29/15  . Essential hypertension   . Fibroids   . Glaucoma   . History of blood transfusion   . History of pneumonia   . Hyperlipidemia   . Insulin-dependent diabetes mellitus with retinopathy   . Liver fibrosis    Negative Hep B surface antigen, negative Hep C antibody Feb 2018 (see scanned  in labs).  . Neuropathy   . Osteomyelitis ([place])    Toe on left foot   Past Surgical History:  Procedure Laterality Date  . A/V SHUNTOGRAM N/A 10/25/2016   Procedure: A/V Shuntogram - Right Arm;  Surgeon: Waynetta Sandy, MD;  Location: [place] [facility];  Service: Cardiovascular;  Laterality: N/A;  . A/V SHUNTOGRAM N/A 03/05/2018   Procedure: A/V SHUNTOGRAM - Right Arm;  Surgeon: Waynetta Sandy, MD;  Location: [place] [facility];  Service: Cardiovascular;  Laterality: N/A;  . AV FISTULA PLACEMENT Right 10/17/2015   Procedure: INSERTION OF ARTERIOVENOUS GORE-TEX GRAFT RIGHT UPPER ARM WITH ACUSEAL;  Surgeon: Conrad [place], MD;  Location: [facility] OR;  Service: Vascular;  Laterality: Right;  . AV FISTULA PLACEMENT Left 11/18/2019   Procedure: INSERTION OF ARTERIOVENOUS (AV) GORE-TEX GRAFT left  ARM;  Surgeon: Serafina [place], MD;  Location: [place];  Service: Vascular;  Laterality: Left;  . CATARACT EXTRACTION W/ INTRAOCULAR LENS IMPLANT Bilateral   . CESAREAN SECTION    . CHOLECYSTECTOMY N/A 05/25/2013   Procedure: LAPAROSCOPIC CHOLECYSTECTOMY;  Surgeon: Jamesetta So, MD;  Location: [facility];  Service: General;  Laterality: N/A;  . COLONOSCOPY WITH PROPOFOL N/A 10/02/2016   two 3 to 5 mm polyps in the descending colon, three 2 to 3 mm polyps in the rectum, random colon biopsies, rectal bleeding due to internal hemorrhoids, friability with no bleeding at  the anus status post biopsy.  Surgical pathology found the polyps to be a mix of hyperplastic and tubular adenoma, random colon biopsies to be benign colonic mucosa, and the anal biopsies to be anal skin tag.   Marland Kitchen ESOPHAGOGASTRODUODENOSCOPY (EGD) WITH PROPOFOL N/A 10/02/2016   mucosal nodule in the esophagus status post biopsy, moderate gastritis status post biopsy, mild duodenitis. esophageal biopsy to be benign, gastric biopsies to be gastritis due to aspirin use, and duodenal biopsies to be duodenitis due to aspirin use  . EYE SURGERY  Bilateral   . IR FLUORO GUIDE CV LINE RIGHT  10/09/2019  . IR REMOVAL TUN CV CATH W/O FL  01/15/2020  . IR THROMBECTOMY AV FISTULA W/THROMBOLYSIS/PTA INC/SHUNT/IMG RIGHT Right 12/26/2018  . IR THROMBECTOMY AV FISTULA W/THROMBOLYSIS/PTA INC/SHUNT/IMG RIGHT Right 08/10/2019  . IR THROMBECTOMY AV FISTULA W/THROMBOLYSIS/PTA/STENT INC/SHUNT/IMG RT Right 08/08/2018  . IR THROMBECTOMY AV FISTULA W/THROMBOLYSIS/PTA/STENT INC/SHUNT/IMG RT Right 05/15/2019  . IR US GUIDE VASC ACCESS RIGHT  08/08/2018  . IR US GUIDE VASC ACCESS RIGHT  12/26/2018  . IR US GUIDE VASC ACCESS RIGHT  05/15/2019  . IR US GUIDE VASC ACCESS RIGHT  08/10/2019  . IR US GUIDE VASC ACCESS RIGHT  10/09/2019  . PARS PLANA VITRECTOMY Left 11/24/2014   Procedure: PARS PLANA VITRECTOMY WITH 25 GAUGE;  Surgeon: Hurman Horn, MD;  Location: [place];  Service: Ophthalmology;  Laterality: Left;  . PERIPHERAL VASCULAR BALLOON ANGIOPLASTY Right 03/05/2018   Procedure: PERIPHERAL VASCULAR BALLOON ANGIOPLASTY;  Surgeon: Waynetta Sandy, MD;  Location: [place] [facility];  Service: Cardiovascular;  Laterality: Right;  arm fistula  . PERIPHERAL VASCULAR CATHETERIZATION N/A 04/28/2015   Procedure: Bilateral Upper Extremity Venography;  Surgeon: Conrad [place], MD;  Location: [place] [facility];  Service: Cardiovascular;  Laterality: N/A;  . PHOTOCOAGULATION WITH LASER Left 11/24/2014   Procedure: PHOTOCOAGULATION WITH LASER;  Surgeon: Hurman Horn, MD;  Location: [place];  Service: Ophthalmology;  Laterality: Left;  with insertion of silicone oil  . SAVORY DILATION N/A 10/02/2016   Procedure: SAVORY DILATION;  Surgeon: Danie Binder, MD;  [facility];  Service: Endoscopy;  Laterality: N/A;  . TUBAL LIGATION    . UPPER EXTREMITY VENOGRAPHY Bilateral 11/02/2019   Procedure: UPPER EXTREMITY VENOGRAPHY;  Surgeon: Waynetta Sandy, MD;  Location: [place] [facility];  Service: Cardiovascular;  Laterality: Bilateral;     SOCIAL HISTORY:    Social History   Socioeconomic History  . Marital status: Single    Spouse name: Not on file  . Number of children: 5  . Years of education: GED  . Highest education level: Not on file  Occupational History  . Not on file  Tobacco Use  . Smoking status: Current Every Day Smoker    Packs/day: 1.00    Years: 23.00    Pack years: 23.00    Types: Cigarettes    Start date: 12/03/2000  . Smokeless tobacco: Never Used  . Tobacco comment: one pack daily  Vaping Use  . Vaping Use: Never used  Substance and Sexual Activity  . Alcohol use: No    Alcohol/week: 0.0 standard drinks  . Drug use: No    Comment: Sober for 8 years  . Sexual activity: Not Currently    Birth control/protection: Surgical  Other Topics Concern  . Not on file  Social History Narrative  . Not on file   Social Determinants of Health   Financial Resource Strain:   . Difficulty  of Paying Living Expenses: Not on file  Food Insecurity:   . Worried About Charity fundraiser in the Last Year: Not on file  . Ran Out of Food in the Last Year: Not on file  Transportation Needs:   . Lack of Transportation (Medical): Not on file  . Lack of Transportation (Non-Medical): Not on file  Physical Activity:   . Days of Exercise per Week: Not on file  . Minutes of Exercise per Session: Not on file  Stress:   . Feeling of Stress : Not on file  Social Connections:   . Frequency of Communication with Friends and Family: Not on file  . Frequency of Social Gatherings with Friends and Family: Not on file  . Attends Religious Services: Not on file  . Active Member of Clubs or Organizations: Not on file  . Attends Archivist Meetings: Not on file  . Marital Status: Not on file  Intimate Partner Violence:   . Fear of Current or Ex-Partner: Not on file  . Emotionally Abused: Not on file  . Physically Abused: Not on file  . Sexually Abused: Not on file    FAMILY HISTORY:  Family History  Problem Relation Age of  Onset  . COPD Mother   . Cancer Father   . Lymphoma Father   . Diabetes Sister   . Deep vein thrombosis Sister   . Diabetes Brother   . Hyperlipidemia Brother   . Hypertension Brother   . Mental retardation Sister   . Alcohol abuse Paternal Grandmother   . Colon cancer Neg Hx   . Liver disease Neg Hx     CURRENT MEDICATIONS:  Outpatient Encounter Medications as of 05/30/2020  Medication Sig  . acetaminophen (TYLENOL) 500 MG tablet Take 1,000 mg by mouth every 6 (six) hours as needed for moderate pain or headache.  . ARIPiprazole (ABILIFY) 5 MG tablet Take 1 tablet (5 mg total) by mouth daily.  Marland Kitchen azelastine (ASTELIN) 0.1 % nasal spray Place 1 spray into both nostrils 2 (two) times daily. Use in each nostril as directed (Patient taking differently: Place 1 spray into both nostrils daily as needed for rhinitis. )  . buPROPion HCl (WELLBUTRIN XL PO) Take by mouth.  . carvedilol (COREG) 12.5 MG tablet Take 12.5 mg by mouth 2 (two) times daily.  . cholecalciferol (VITAMIN D) 1000 units tablet Take 1,000 Units by mouth daily.   . COMBIGAN 0.2-0.5 % ophthalmic solution INSTILL ONE DROP IN [facility] EYE TWICE DAILY  . Continuous Blood Gluc Sensor (FREESTYLE LIBRE 14 DAY SENSOR) MISC Inject 1 each into the skin every 14 (fourteen) days. Use as directed.  . cyclobenzaprine (FLEXERIL) 5 MG tablet TAKE ONE TABLET BY MOUTH THREE TIMES DAILY AS NEEDED FOR MUSCLE SPASM  . diclofenac sodium (VOLTAREN) 1 % GEL Apply 2 g topically 4 (four) times daily. (Patient taking differently: Apply 2 g topically 4 (four) times daily as needed (pain). )  . gabapentin (NEURONTIN) 100 MG capsule Take 1 capsule (100 mg total) by mouth 3 (three) times daily. (Needs to be seen before next refill)  . glucose blood (PRODIGY NO CODING BLOOD GLUC) test strip USE TO CHECK BLOOD SUGAR TWICE DAILY. Dx E11.22  . ibuprofen (ADVIL) 200 MG tablet Take 600 mg by mouth every 6 (six) hours as needed for headache or moderate pain.  . Insulin  Pen Needle (TRUEPLUS PEN NEEDLES) 31G X 5 MM MISC Use daily with insulin Dx E11.22  . LANTUS SOLOSTAR  100 UNIT/ML Solostar Pen INJECT 18-50 UNITS SUBCUTANEOUSLY AT BEDTIME. (Patient taking differently: Inject 20 Units into the skin at bedtime. )  . levofloxacin (LEVAQUIN) 750 MG tablet   . linagliptin (TRADJENTA) 5 MG TABS tablet Take 1 tablet (5 mg total) by mouth daily. (Patient taking differently: Take 5 mg by mouth every evening. )  . multivitamin (RENA-VIT) TABS tablet Take 1 tablet by mouth daily.  . pantoprazole (PROTONIX) 40 MG tablet TAKE 1 BY MOUTH DAILY 30 minutes before breakfast (Patient taking differently: Take 40 mg by mouth daily. )  . Probiotic Product (CULTURELLE PROBIOTICS PO) Take 1 capsule by mouth daily.   Marland Kitchen PRODIGY TWIST TOP LANCETS 28G MISC USE TO CHECK BLOOD SUGAR UP TO FOUR TIMES DAILY.  Marland Kitchen sertraline (ZOLOFT) 100 MG tablet Take 1.5 tablets (150 mg total) by mouth daily.  . sevelamer carbonate (RENVELA) 800 MG tablet Take 800-2,400 mg by mouth See admin instructions. Take 2478m by mouth three times daily with means and 8060mwith snacks  . SYMBICORT 160-4.5 MCG/ACT inhaler   . traZODone (DESYREL) 50 MG tablet Take 0.5-1 tablets (25-50 mg total) by mouth at bedtime as needed. for sleep (Patient taking differently: Take 25 mg by mouth at bedtime as needed for sleep. )  . VENTOLIN HFA 108 (90 Base) MCG/ACT inhaler   . lidocaine-prilocaine (EMLA) cream Apply 1 application topically daily as needed (arm port). (Patient not taking: Reported on 05/30/2020)   No facility-administered encounter medications on file as of 05/30/2020.    ALLERGIES:  Allergies  Allergen Reactions  . Sulfamethoxazole-Trimethoprim Nausea And Vomiting  . Bactrim [Sulfamethoxazole-Trimethoprim] Nausea And Vomiting  . Prednisone Other (See Comments)    "I was wide open and couldn't eat" per pt. Loss of appetite and insomnia  LOSS OF APPETITE,UNABLE TO SLEEP     PHYSICAL EXAM:  ECOG Performance  status: 1  Vitals:   05/30/20 0927 05/30/20 0936  BP:  (!) 160/76  Pulse: 74   Resp: 17   Temp: (!) 97 F (36.1 C)   SpO2: 99%    Filed Weights   05/30/20 0927  Weight: 195 lb 4.8 oz (88.6 kg)    Physical Exam Vitals reviewed.  Constitutional:      Appearance: Normal appearance.  Cardiovascular:     Rate and Rhythm: Normal rate and regular rhythm.     Heart sounds: Normal heart sounds.  Pulmonary:     Effort: Pulmonary effort is normal.     Breath sounds: Normal breath sounds.  Abdominal:     General: There is no distension.     Palpations: Abdomen is soft. There is no mass.  Skin:    General: Skin is warm.  Neurological:     General: No focal deficit present.     Mental Status: She is alert and oriented to person, place, and time.  Psychiatric:        Mood and Affect: Mood normal.        Behavior: Behavior normal.      LABORATORY DATA:  I have reviewed the labs as listed.  CBC    Component Value Date/Time   WBC 8.8 05/06/2020 0841   RBC 3.48 (L) 05/06/2020 0841   HGB 10.8 (L) 05/06/2020 0841   HGB 12.1 05/14/2018 1357   HCT 33.3 (L) 05/06/2020 0841   HCT 35.9 05/14/2018 1357   PLT 185 05/06/2020 0841   PLT 171 05/14/2018 1357   MCV 95.7 05/06/2020 0841   MCV 99 (H) 05/14/2018 1357  MCH 31.0 05/06/2020 0841   MCHC 32.4 05/06/2020 0841   RDW 15.7 (H) 05/06/2020 0841   RDW 12.6 05/14/2018 1357   LYMPHSABS 1.1 05/06/2020 0841   LYMPHSABS 1.1 05/14/2018 1357   MONOABS 0.7 05/06/2020 0841   EOSABS 0.1 05/06/2020 0841   EOSABS 0.1 05/14/2018 1357   BASOSABS 0.1 05/06/2020 0841   BASOSABS 0.1 05/14/2018 1357   CMP Latest Ref Rng & Units 11/18/2019 11/02/2019 10/09/2019  Glucose 70 - 99 mg/dL 131(H) 241(H) 95  BUN 6 - 20 mg/dL 33(H) 45(H) 97(H)  Creatinine 0.44 - 1.00 mg/dL 6.00(H) 8.50(H) 12.16(H)  Sodium 135 - 145 mmol/L 137 137 136  Potassium 3.5 - 5.1 mmol/L 4.6 4.6 6.7(HH)  Chloride 98 - 111 mmol/L 100 100 103  CO2 22 - 32 mmol/L - - 19(L)    Calcium 8.9 - 10.3 mg/dL - - 9.1  Total Protein 6.1 - 8.1 g/dL - - -  Total Bilirubin 0.2 - 1.2 mg/dL - - -  Alkaline Phos 38 - 126 U/L - - -  AST 10 - 35 U/L - - -  ALT 6 - 29 U/L - - -       DIAGNOSTIC IMAGING:  I have independently reviewed the scans and discussed with the patient.      ASSESSMENT & PLAN:  1.  Elevated ferritin levels: - She was previously tested for hemochromatosis mutation panel which was negative. -Liver biopsy on 02/07/2018 showed secondary iron overload.  She was receiving iron infusions at dialysis. - She has not received any parenteral iron therapy in the past 2-3 years.  - She denies any family history of hemochromatosis. - Labs today showed ferritin improved to 157 with percent saturation of 21.  Ferritin was 391 on 05/04/2019. - No active intervention necessary at this time.  She was told to avoid any iron supplements and vitamin C. - She will come back in 6 months with repeat labs.  2.  ESRD: - She had ESRD from diabetes.  She has been receiving dialysis for the last 4 years. -She gets dialyzed on Tuesdays, Thursdays and Saturdays.  Disposition: Return to clinic in 6 months with repeat lab work and MD assessment (CBC with differential, CMP, ferritin and iron panel).  No problem-specific Assessment & Plan notes found for this encounter.  Orders placed this encounter:  Orders Placed This Encounter  Procedures  . CBC with Differential/Platelet  . Iron and TIBC  . Ferritin  . Vitamin B12  . Folate   Greater than 50% was spent in counseling and coordination of care with this patient including but not limited to discussion of the relevant topics above (See A&P) including, but not limited to diagnosis and management of acute and chronic medical conditions.   Faythe Casa, NP 05/30/2020 3:42 PM  [place] [phone]

## 2020-05-30 NOTE — Assessment & Plan Note (Deleted)
1.  Elevated ferritin levels: - She was previously tested for hemochromatosis mutation panel which was negative. -Liver biopsy on 02/07/2018 showed secondary iron overload.  She was receiving iron infusions at dialysis. - She has not received any parenteral iron therapy in the last 1 year. - She denies any family history of hemochromatosis. - Labs today showed ferritin improved to 391 with percent saturation of 11.  Ferritin was 633 on 10/01/2018. - No active intervention necessary at this time.  She was told to avoid any iron supplements and vitamin C. - She will come back in 6 months with repeat labs.  2.  ESRD: - She had ESRD from diabetes.  She has been receiving dialysis for the last 3 years. -She gets dialyzed on Tuesdays, Thursdays and Saturdays.

## 2020-05-31 DIAGNOSIS — D631 Anemia in chronic kidney disease: Secondary | ICD-10-CM | POA: Diagnosis not present

## 2020-05-31 DIAGNOSIS — Z992 Dependence on renal dialysis: Secondary | ICD-10-CM | POA: Diagnosis not present

## 2020-05-31 DIAGNOSIS — N250 Renal osteodystrophy: Secondary | ICD-10-CM | POA: Diagnosis not present

## 2020-05-31 DIAGNOSIS — N186 End stage renal disease: Secondary | ICD-10-CM | POA: Diagnosis not present

## 2020-06-02 ENCOUNTER — Other Ambulatory Visit: Payer: Self-pay | Admitting: Family Medicine

## 2020-06-02 DIAGNOSIS — D631 Anemia in chronic kidney disease: Secondary | ICD-10-CM | POA: Diagnosis not present

## 2020-06-02 DIAGNOSIS — N250 Renal osteodystrophy: Secondary | ICD-10-CM | POA: Diagnosis not present

## 2020-06-02 DIAGNOSIS — Z992 Dependence on renal dialysis: Secondary | ICD-10-CM | POA: Diagnosis not present

## 2020-06-02 DIAGNOSIS — N186 End stage renal disease: Secondary | ICD-10-CM | POA: Diagnosis not present

## 2020-06-04 DIAGNOSIS — N250 Renal osteodystrophy: Secondary | ICD-10-CM | POA: Diagnosis not present

## 2020-06-04 DIAGNOSIS — Z992 Dependence on renal dialysis: Secondary | ICD-10-CM | POA: Diagnosis not present

## 2020-06-04 DIAGNOSIS — N186 End stage renal disease: Secondary | ICD-10-CM | POA: Diagnosis not present

## 2020-06-04 DIAGNOSIS — D631 Anemia in chronic kidney disease: Secondary | ICD-10-CM | POA: Diagnosis not present

## 2020-06-06 ENCOUNTER — Encounter: Payer: Self-pay | Admitting: Psychiatry

## 2020-06-06 ENCOUNTER — Other Ambulatory Visit: Payer: Self-pay

## 2020-06-06 ENCOUNTER — Telehealth (INDEPENDENT_AMBULATORY_CARE_PROVIDER_SITE_OTHER): Payer: Medicare Other | Admitting: Psychiatry

## 2020-06-06 ENCOUNTER — Telehealth (HOSPITAL_COMMUNITY): Payer: Medicare Other | Admitting: Psychiatry

## 2020-06-06 DIAGNOSIS — F3341 Major depressive disorder, recurrent, in partial remission: Secondary | ICD-10-CM

## 2020-06-06 DIAGNOSIS — G4700 Insomnia, unspecified: Secondary | ICD-10-CM

## 2020-06-06 MED ORDER — SERTRALINE HCL 100 MG PO TABS: 150.0000 mg | ORAL_TABLET | Freq: Every day | ORAL | 0 refills | Status: DC

## 2020-06-06 MED ORDER — TRAZODONE HCL 100 MG PO TABS: 100.0000 mg | ORAL_TABLET | Freq: Every evening | ORAL | 1 refills | Status: DC | PRN

## 2020-06-06 MED ORDER — ARIPIPRAZOLE 5 MG PO TABS: 5.0000 mg | ORAL_TABLET | Freq: Every day | ORAL | 0 refills | Status: DC

## 2020-06-06 NOTE — Patient Instructions (Signed)
1.Continuesertraline 150 mg daily  2.ContinueAbilify 5 mg at night 3. Increase Trazodone 100 mg at night as needed for sleep 3. Next appointment:1/3 at 11:40

## 2020-06-07 DIAGNOSIS — N250 Renal osteodystrophy: Secondary | ICD-10-CM | POA: Diagnosis not present

## 2020-06-07 DIAGNOSIS — N186 End stage renal disease: Secondary | ICD-10-CM | POA: Diagnosis not present

## 2020-06-07 DIAGNOSIS — Z992 Dependence on renal dialysis: Secondary | ICD-10-CM | POA: Diagnosis not present

## 2020-06-07 DIAGNOSIS — D631 Anemia in chronic kidney disease: Secondary | ICD-10-CM | POA: Diagnosis not present

## 2020-06-08 ENCOUNTER — Ambulatory Visit (INDEPENDENT_AMBULATORY_CARE_PROVIDER_SITE_OTHER): Payer: Medicare Other | Admitting: Clinical

## 2020-06-08 ENCOUNTER — Other Ambulatory Visit: Payer: Self-pay

## 2020-06-08 DIAGNOSIS — F3341 Major depressive disorder, recurrent, in partial remission: Secondary | ICD-10-CM | POA: Diagnosis not present

## 2020-06-08 NOTE — Progress Notes (Signed)
  Virtual Visit via Telephone Note  I connected withViolet A Berrieron11/10/21at 11:00 AM EDTby telephoneand verified that I am speaking with the correct person using two identifiers.  Location: Patient:Home Provider:Office  I discussed the limitations of evaluation and management by telemedicine and the availability of in person appointments. The patient expressed understanding and agreed to proceed.    THERAPIST PROGRESS NOTE  Session Time:11:00AM-11:45AM  Participation Level:Active  Behavioral Response:CasualAlertDepressed  Type of Therapy:Individual Therapy  Treatment Goals addressed:Coping  Interventions:CBT  Summary:Kara A Berrieris a 49 y.o.femalewho presents with Depression.The OPT therapist worked with thepatientfor herongoing OPT treatmentsession. The OPT therapist utilized Motivational Interviewing to assist in creating therapeutic repore. The patient in the session was engaged and work in Science writer about hertriggers and symptoms over the past few weeks including her interactions with siblings who are in [place] from [place] and ongoing work on smoking cessation. The OPT therapist utilized Cognitive Behavioral Therapy through cognitive restructuring as well as worked with the patient on coping strategies to assist in management ofmood and the patient has been working to be active and socially involved however verbalized the impact of her siblings visiting and her feelings of neglect.The patient reportedthe shecontinues to work on implementing positive thinking, self esteem, and staying activeas well as being aware of and consistent in keeping her health care appointments.  Suicidal/Homicidal:Nowithout intent/plan  Therapist Response:The OPT therapist worked with the patient for the patients scheduled session. The patient was engaged in his session and gave feedback in relation to triggers, symptoms, and behavior  responses over the pastfewweeks. The OPT therapist worked with the patient utilizing an in session Cognitive Behavioral Therapy exercise. The patient was responsive in the session and verbalized, "Iam going to try gradually working down from a pack and a half a day to just one pack of cigarettes and to stay more active I think part of why I am smoking so much is due to not having anything else I am filling my time with".The OPT therapist worked with the patient on implementing positive thinking and reviewing how this impacts moodand gave praise for her working to be more conscientious and active to help in her individual goal of working on smoking cessation to improve her physical health and well as improve her mental health.The OPT therapist will continue treatment work with the patient in hernext scheduled session  Plan: Return again in3weeks.  Diagnosis:Axis I:MDD (major depressive disorder), recurrent, in partial remission   Axis II:No diagnosis  I discussed the assessment and treatment plan with the patient. The patient was provided an opportunity to ask questions and all were answered. The patient agreed with the plan and demonstrated an understanding of the instructions.  The patient was advised to call back or seek an in-person evaluation if the symptoms worsen or if the condition fails to improve as anticipated.  I provided53mnutes of non-face-to-face time during this encounter.  TLennox Grumbles LCSW 06/08/2020

## 2020-06-08 NOTE — Addendum Note (Signed)
Addended by: Maye Hides T on: 06/08/2020 11:47 AM   Modules accepted: Level of Service

## 2020-06-09 DIAGNOSIS — Z992 Dependence on renal dialysis: Secondary | ICD-10-CM | POA: Diagnosis not present

## 2020-06-09 DIAGNOSIS — N186 End stage renal disease: Secondary | ICD-10-CM | POA: Diagnosis not present

## 2020-06-09 DIAGNOSIS — N250 Renal osteodystrophy: Secondary | ICD-10-CM | POA: Diagnosis not present

## 2020-06-09 DIAGNOSIS — D631 Anemia in chronic kidney disease: Secondary | ICD-10-CM | POA: Diagnosis not present

## 2020-06-11 DIAGNOSIS — D631 Anemia in chronic kidney disease: Secondary | ICD-10-CM | POA: Diagnosis not present

## 2020-06-11 DIAGNOSIS — N250 Renal osteodystrophy: Secondary | ICD-10-CM | POA: Diagnosis not present

## 2020-06-11 DIAGNOSIS — Z992 Dependence on renal dialysis: Secondary | ICD-10-CM | POA: Diagnosis not present

## 2020-06-11 DIAGNOSIS — N186 End stage renal disease: Secondary | ICD-10-CM | POA: Diagnosis not present

## 2020-06-13 ENCOUNTER — Ambulatory Visit (HOSPITAL_COMMUNITY): Payer: Medicare Other

## 2020-06-14 DIAGNOSIS — N250 Renal osteodystrophy: Secondary | ICD-10-CM | POA: Diagnosis not present

## 2020-06-14 DIAGNOSIS — D631 Anemia in chronic kidney disease: Secondary | ICD-10-CM | POA: Diagnosis not present

## 2020-06-14 DIAGNOSIS — Z992 Dependence on renal dialysis: Secondary | ICD-10-CM | POA: Diagnosis not present

## 2020-06-14 DIAGNOSIS — N186 End stage renal disease: Secondary | ICD-10-CM | POA: Diagnosis not present

## 2020-06-15 DIAGNOSIS — K7469 Other cirrhosis of liver: Secondary | ICD-10-CM | POA: Diagnosis not present

## 2020-06-15 DIAGNOSIS — K219 Gastro-esophageal reflux disease without esophagitis: Secondary | ICD-10-CM | POA: Diagnosis not present

## 2020-06-15 DIAGNOSIS — K760 Fatty (change of) liver, not elsewhere classified: Secondary | ICD-10-CM | POA: Diagnosis not present

## 2020-06-16 DIAGNOSIS — N250 Renal osteodystrophy: Secondary | ICD-10-CM | POA: Diagnosis not present

## 2020-06-16 DIAGNOSIS — D631 Anemia in chronic kidney disease: Secondary | ICD-10-CM | POA: Diagnosis not present

## 2020-06-16 DIAGNOSIS — N186 End stage renal disease: Secondary | ICD-10-CM | POA: Diagnosis not present

## 2020-06-16 DIAGNOSIS — Z992 Dependence on renal dialysis: Secondary | ICD-10-CM | POA: Diagnosis not present

## 2020-06-17 ENCOUNTER — Other Ambulatory Visit: Payer: Self-pay

## 2020-06-17 ENCOUNTER — Ambulatory Visit (HOSPITAL_COMMUNITY): Admission: RE | Admit: 2020-06-17 | Payer: Medicare Other | Source: Ambulatory Visit

## 2020-06-17 DIAGNOSIS — R768 Other specified abnormal immunological findings in serum: Secondary | ICD-10-CM

## 2020-06-17 DIAGNOSIS — K7460 Unspecified cirrhosis of liver: Secondary | ICD-10-CM

## 2020-06-17 DIAGNOSIS — Z1159 Encounter for screening for other viral diseases: Secondary | ICD-10-CM

## 2020-06-18 DIAGNOSIS — N186 End stage renal disease: Secondary | ICD-10-CM | POA: Diagnosis not present

## 2020-06-18 DIAGNOSIS — N250 Renal osteodystrophy: Secondary | ICD-10-CM | POA: Diagnosis not present

## 2020-06-18 DIAGNOSIS — Z992 Dependence on renal dialysis: Secondary | ICD-10-CM | POA: Diagnosis not present

## 2020-06-18 DIAGNOSIS — D631 Anemia in chronic kidney disease: Secondary | ICD-10-CM | POA: Diagnosis not present

## 2020-06-20 ENCOUNTER — Telehealth: Payer: Self-pay | Admitting: Internal Medicine

## 2020-06-20 LAB — COMPREHENSIVE METABOLIC PANEL
AG Ratio: 1.6 (calc) (ref 1.0–2.5)
ALT: 14 U/L (ref 6–29)
AST: 20 U/L (ref 10–35)
Albumin: 4.2 g/dL (ref 3.6–5.1)
Alkaline phosphatase (APISO): 103 U/L (ref 37–153)
BUN/Creatinine Ratio: 5 (calc) — ABNORMAL LOW (ref 6–22)
BUN: 32 mg/dL — ABNORMAL HIGH (ref 7–25)
CO2: 28 mmol/L (ref 20–32)
Calcium: 9.3 mg/dL (ref 8.6–10.4)
Chloride: 98 mmol/L (ref 98–110)
Creat: 6.52 mg/dL — ABNORMAL HIGH (ref 0.50–1.05)
Globulin: 2.7 g/dL (calc) (ref 1.9–3.7)
Glucose, Bld: 139 mg/dL — ABNORMAL HIGH (ref 65–99)
Potassium: 4.5 mmol/L (ref 3.5–5.3)
Sodium: 140 mmol/L (ref 135–146)
Total Bilirubin: 0.4 mg/dL (ref 0.2–1.2)
Total Protein: 6.9 g/dL (ref 6.1–8.1)

## 2020-06-20 LAB — HCV RNA,QUANTITATIVE REAL TIME PCR
HCV Quantitative Log: <1.18 Log IU/mL
HCV RNA, PCR, QN: <15 IU/mL

## 2020-06-20 LAB — CBC WITH DIFFERENTIAL/PLATELET
Absolute Monocytes: 554 cells/uL (ref 200–950)
Basophils Absolute: 72 cells/uL (ref 0–200)
Basophils Relative: 1 %
Eosinophils Absolute: 108 cells/uL (ref 15–500)
Eosinophils Relative: 1.5 %
HCT: 32.7 % — ABNORMAL LOW (ref 35.0–45.0)
Hemoglobin: 10.5 g/dL — ABNORMAL LOW (ref 11.7–15.5)
Lymphs Abs: 1310 cells/uL (ref 850–3900)
MCH: 31.2 pg (ref 27.0–33.0)
MCHC: 32.1 g/dL (ref 32.0–36.0)
MCV: 97 fL (ref 80.0–100.0)
MPV: 10.3 fL (ref 7.5–12.5)
Monocytes Relative: 7.7 %
Neutro Abs: 5155 cells/uL (ref 1500–7800)
Neutrophils Relative %: 71.6 %
Platelets: 208 10*3/uL (ref 140–400)
RBC: 3.37 10*6/uL — ABNORMAL LOW (ref 3.80–5.10)
RDW: 14.2 % (ref 11.0–15.0)
Total Lymphocyte: 18.2 %
WBC: 7.2 10*3/uL (ref 3.8–10.8)

## 2020-06-20 LAB — HEPATITIS C ANTIBODY
Hepatitis C Ab: REACTIVE — AB
SIGNAL TO CUT-OFF: 12 — ABNORMAL HIGH (ref <1.00)

## 2020-06-20 LAB — PROTIME-INR
INR: 1
Prothrombin Time: 10.3 s (ref 9.0–11.5)

## 2020-06-20 LAB — IRON,TIBC AND FERRITIN PANEL
%SAT: 20 % (calc) (ref 16–45)
Ferritin: 86 ng/mL (ref 16–232)
Iron: 82 ug/dL (ref 45–160)
TIBC: 415 mcg/dL (calc) (ref 250–450)

## 2020-06-20 LAB — IGA: Immunoglobulin A: 200 mg/dL (ref 47–310)

## 2020-06-20 LAB — TISSUE TRANSGLUTAMINASE, IGA: (tTG) Ab, IgA: <1 U/mL

## 2020-06-20 LAB — HEPATITIS B SURFACE ANTIBODY,QUALITATIVE: Hep B S Ab: REACTIVE — AB

## 2020-06-20 LAB — HEPATITIS A ANTIBODY, TOTAL: Hepatitis A AB,Total: NONREACTIVE

## 2020-06-20 NOTE — Telephone Encounter (Signed)
Phoned and spoke with the pt was advised she wants to know who would be sending her Hep. C medication or will you be sending it to [facility] in [place], also she was needing a Rx to get a Vaccine for Hep. A .

## 2020-06-20 NOTE — Telephone Encounter (Signed)
[phone]  Please call patient about her labs, she had a question

## 2020-06-21 DIAGNOSIS — Z992 Dependence on renal dialysis: Secondary | ICD-10-CM | POA: Diagnosis not present

## 2020-06-21 DIAGNOSIS — D631 Anemia in chronic kidney disease: Secondary | ICD-10-CM | POA: Diagnosis not present

## 2020-06-21 DIAGNOSIS — N250 Renal osteodystrophy: Secondary | ICD-10-CM | POA: Diagnosis not present

## 2020-06-21 DIAGNOSIS — N186 End stage renal disease: Secondary | ICD-10-CM | POA: Diagnosis not present

## 2020-06-22 DIAGNOSIS — N250 Renal osteodystrophy: Secondary | ICD-10-CM | POA: Diagnosis not present

## 2020-06-22 DIAGNOSIS — D631 Anemia in chronic kidney disease: Secondary | ICD-10-CM | POA: Diagnosis not present

## 2020-06-22 DIAGNOSIS — N186 End stage renal disease: Secondary | ICD-10-CM | POA: Diagnosis not present

## 2020-06-22 DIAGNOSIS — Z992 Dependence on renal dialysis: Secondary | ICD-10-CM | POA: Diagnosis not present

## 2020-06-25 DIAGNOSIS — N250 Renal osteodystrophy: Secondary | ICD-10-CM | POA: Diagnosis not present

## 2020-06-25 DIAGNOSIS — D631 Anemia in chronic kidney disease: Secondary | ICD-10-CM | POA: Diagnosis not present

## 2020-06-25 DIAGNOSIS — Z992 Dependence on renal dialysis: Secondary | ICD-10-CM | POA: Diagnosis not present

## 2020-06-25 DIAGNOSIS — N186 End stage renal disease: Secondary | ICD-10-CM | POA: Diagnosis not present

## 2020-06-27 ENCOUNTER — Telehealth: Payer: Self-pay | Admitting: Internal Medicine

## 2020-06-27 NOTE — Telephone Encounter (Signed)
Pt called to say that she called last Monday and we were suppose to send her prescription for her hepatitis to [facility], but they still haven't received anything. Please advise. [phone]

## 2020-06-27 NOTE — Telephone Encounter (Signed)
Routing to [employer]

## 2020-06-27 NOTE — Telephone Encounter (Signed)
Dr. Abbey Chatters the pt has her Rx to get her Hep.A vaccine but she's still waiting on her Rx for Hepatitis medication to be sent to [facility] in [place]. She checked this morning and they still have not received it.

## 2020-06-28 ENCOUNTER — Other Ambulatory Visit: Payer: Self-pay

## 2020-06-28 DIAGNOSIS — N186 End stage renal disease: Secondary | ICD-10-CM | POA: Diagnosis not present

## 2020-06-28 DIAGNOSIS — D631 Anemia in chronic kidney disease: Secondary | ICD-10-CM | POA: Diagnosis not present

## 2020-06-28 DIAGNOSIS — Z8619 Personal history of other infectious and parasitic diseases: Secondary | ICD-10-CM

## 2020-06-28 DIAGNOSIS — N250 Renal osteodystrophy: Secondary | ICD-10-CM | POA: Diagnosis not present

## 2020-06-28 DIAGNOSIS — Z992 Dependence on renal dialysis: Secondary | ICD-10-CM | POA: Diagnosis not present

## 2020-06-28 NOTE — Telephone Encounter (Signed)
noted 

## 2020-06-28 NOTE — Telephone Encounter (Signed)
Kara Knapp, waiting on a return call from pt.

## 2020-06-28 NOTE — Telephone Encounter (Signed)
FYI Pt returned call and is aware that the Hep C RNA was added on and she doesn't have Hep C. Pt understands recommendations of repeat labs in 3 months and  r/s u/s. Pt will r/s u/s in the morning.

## 2020-06-28 NOTE — Telephone Encounter (Signed)
Kara Knapp, as per result note attached to Hep C ab result 06/15/20, we have not mentioned hepatitis C medication to patient yet. Hep C medication is not sent to a regular pharmacy, it goes through speciality pharmacy.    She has had positive Hep C ab test but confirmation test had to be done. Her HCV RNA had to be added on and for some reason the result not sent to me, glad she called.  Her HCV RNA is negative. This means that there is no detected Hep C. Her Hep C antibody test either was false positive or she had Hep C and cleared it or she has such low level that it cannot be detected.   1. Please ask her to reschedule her ruq u/s that we had planned for. 2. Please arrange for HCV RNA quantitative to be rechecked in 3 months.

## 2020-06-28 NOTE — Telephone Encounter (Signed)
Thanks

## 2020-06-30 DIAGNOSIS — N250 Renal osteodystrophy: Secondary | ICD-10-CM | POA: Diagnosis not present

## 2020-06-30 DIAGNOSIS — Z992 Dependence on renal dialysis: Secondary | ICD-10-CM | POA: Diagnosis not present

## 2020-06-30 DIAGNOSIS — D631 Anemia in chronic kidney disease: Secondary | ICD-10-CM | POA: Diagnosis not present

## 2020-06-30 DIAGNOSIS — E8779 Other fluid overload: Secondary | ICD-10-CM | POA: Diagnosis not present

## 2020-06-30 DIAGNOSIS — N186 End stage renal disease: Secondary | ICD-10-CM | POA: Diagnosis not present

## 2020-07-02 DIAGNOSIS — N250 Renal osteodystrophy: Secondary | ICD-10-CM | POA: Diagnosis not present

## 2020-07-02 DIAGNOSIS — Z992 Dependence on renal dialysis: Secondary | ICD-10-CM | POA: Diagnosis not present

## 2020-07-02 DIAGNOSIS — E8779 Other fluid overload: Secondary | ICD-10-CM | POA: Diagnosis not present

## 2020-07-02 DIAGNOSIS — D631 Anemia in chronic kidney disease: Secondary | ICD-10-CM | POA: Diagnosis not present

## 2020-07-02 DIAGNOSIS — N186 End stage renal disease: Secondary | ICD-10-CM | POA: Diagnosis not present

## 2020-07-04 ENCOUNTER — Other Ambulatory Visit: Payer: Self-pay

## 2020-07-04 ENCOUNTER — Ambulatory Visit (INDEPENDENT_AMBULATORY_CARE_PROVIDER_SITE_OTHER): Payer: Medicare Other | Admitting: Clinical

## 2020-07-04 DIAGNOSIS — F3341 Major depressive disorder, recurrent, in partial remission: Secondary | ICD-10-CM | POA: Diagnosis not present
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
## 2020-07-05 DIAGNOSIS — N186 End stage renal disease: Secondary | ICD-10-CM | POA: Diagnosis not present

## 2020-07-05 DIAGNOSIS — D631 Anemia in chronic kidney disease: Secondary | ICD-10-CM | POA: Diagnosis not present

## 2020-07-05 DIAGNOSIS — E8779 Other fluid overload: Secondary | ICD-10-CM | POA: Diagnosis not present

## 2020-07-05 DIAGNOSIS — Z992 Dependence on renal dialysis: Secondary | ICD-10-CM | POA: Diagnosis not present

## 2020-07-05 DIAGNOSIS — N250 Renal osteodystrophy: Secondary | ICD-10-CM | POA: Diagnosis not present

## 2020-07-07 DIAGNOSIS — Z992 Dependence on renal dialysis: Secondary | ICD-10-CM | POA: Diagnosis not present

## 2020-07-07 DIAGNOSIS — N250 Renal osteodystrophy: Secondary | ICD-10-CM | POA: Diagnosis not present

## 2020-07-07 DIAGNOSIS — D631 Anemia in chronic kidney disease: Secondary | ICD-10-CM | POA: Diagnosis not present

## 2020-07-07 DIAGNOSIS — E8779 Other fluid overload: Secondary | ICD-10-CM | POA: Diagnosis not present

## 2020-07-07 DIAGNOSIS — N186 End stage renal disease: Secondary | ICD-10-CM | POA: Diagnosis not present

## 2020-07-08 ENCOUNTER — Other Ambulatory Visit: Payer: Self-pay

## 2020-07-08 ENCOUNTER — Ambulatory Visit (HOSPITAL_COMMUNITY)
Admission: RE | Admit: 2020-07-08 | Discharge: 2020-07-08 | Disposition: A | Discharge status: Home / Self Care | Payer: Medicare Other | Source: Ambulatory Visit | Attending: Gastroenterology | Admitting: Gastroenterology

## 2020-07-08 DIAGNOSIS — K760 Fatty (change of) liver, not elsewhere classified: Secondary | ICD-10-CM | POA: Insufficient documentation

## 2020-07-08 DIAGNOSIS — K219 Gastro-esophageal reflux disease without esophagitis: Secondary | ICD-10-CM | POA: Diagnosis not present

## 2020-07-08 DIAGNOSIS — K7469 Other cirrhosis of liver: Secondary | ICD-10-CM | POA: Diagnosis not present

## 2020-07-09 DIAGNOSIS — Z992 Dependence on renal dialysis: Secondary | ICD-10-CM | POA: Diagnosis not present

## 2020-07-09 DIAGNOSIS — D631 Anemia in chronic kidney disease: Secondary | ICD-10-CM | POA: Diagnosis not present

## 2020-07-09 DIAGNOSIS — E8779 Other fluid overload: Secondary | ICD-10-CM | POA: Diagnosis not present

## 2020-07-09 DIAGNOSIS — N186 End stage renal disease: Secondary | ICD-10-CM | POA: Diagnosis not present

## 2020-07-09 DIAGNOSIS — N250 Renal osteodystrophy: Secondary | ICD-10-CM | POA: Diagnosis not present

## 2020-07-12 DIAGNOSIS — N186 End stage renal disease: Secondary | ICD-10-CM | POA: Diagnosis not present

## 2020-07-12 DIAGNOSIS — D631 Anemia in chronic kidney disease: Secondary | ICD-10-CM | POA: Diagnosis not present

## 2020-07-12 DIAGNOSIS — Z992 Dependence on renal dialysis: Secondary | ICD-10-CM | POA: Diagnosis not present

## 2020-07-12 DIAGNOSIS — E8779 Other fluid overload: Secondary | ICD-10-CM | POA: Diagnosis not present

## 2020-07-12 DIAGNOSIS — N250 Renal osteodystrophy: Secondary | ICD-10-CM | POA: Diagnosis not present

## 2020-07-14 DIAGNOSIS — Z992 Dependence on renal dialysis: Secondary | ICD-10-CM | POA: Diagnosis not present

## 2020-07-14 DIAGNOSIS — E8779 Other fluid overload: Secondary | ICD-10-CM | POA: Diagnosis not present

## 2020-07-14 DIAGNOSIS — N186 End stage renal disease: Secondary | ICD-10-CM | POA: Diagnosis not present

## 2020-07-14 DIAGNOSIS — N250 Renal osteodystrophy: Secondary | ICD-10-CM | POA: Diagnosis not present

## 2020-07-14 DIAGNOSIS — D631 Anemia in chronic kidney disease: Secondary | ICD-10-CM | POA: Diagnosis not present

## 2020-07-16 DIAGNOSIS — N186 End stage renal disease: Secondary | ICD-10-CM | POA: Diagnosis not present

## 2020-07-16 DIAGNOSIS — E8779 Other fluid overload: Secondary | ICD-10-CM | POA: Diagnosis not present

## 2020-07-16 DIAGNOSIS — D631 Anemia in chronic kidney disease: Secondary | ICD-10-CM | POA: Diagnosis not present

## 2020-07-16 DIAGNOSIS — N250 Renal osteodystrophy: Secondary | ICD-10-CM | POA: Diagnosis not present

## 2020-07-16 DIAGNOSIS — Z992 Dependence on renal dialysis: Secondary | ICD-10-CM | POA: Diagnosis not present
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
## 2020-07-19 DIAGNOSIS — D631 Anemia in chronic kidney disease: Secondary | ICD-10-CM | POA: Diagnosis not present

## 2020-07-19 DIAGNOSIS — Z992 Dependence on renal dialysis: Secondary | ICD-10-CM | POA: Diagnosis not present

## 2020-07-19 DIAGNOSIS — E8779 Other fluid overload: Secondary | ICD-10-CM | POA: Diagnosis not present

## 2020-07-19 DIAGNOSIS — N250 Renal osteodystrophy: Secondary | ICD-10-CM | POA: Diagnosis not present

## 2020-07-19 DIAGNOSIS — N186 End stage renal disease: Secondary | ICD-10-CM | POA: Diagnosis not present

## 2020-07-21 DIAGNOSIS — D631 Anemia in chronic kidney disease: Secondary | ICD-10-CM | POA: Diagnosis not present

## 2020-07-21 DIAGNOSIS — N186 End stage renal disease: Secondary | ICD-10-CM | POA: Diagnosis not present

## 2020-07-21 DIAGNOSIS — N250 Renal osteodystrophy: Secondary | ICD-10-CM | POA: Diagnosis not present

## 2020-07-21 DIAGNOSIS — E8779 Other fluid overload: Secondary | ICD-10-CM | POA: Diagnosis not present

## 2020-07-21 DIAGNOSIS — Z992 Dependence on renal dialysis: Secondary | ICD-10-CM | POA: Diagnosis not present

## 2020-07-25 DIAGNOSIS — E8779 Other fluid overload: Secondary | ICD-10-CM | POA: Diagnosis not present

## 2020-07-25 DIAGNOSIS — N186 End stage renal disease: Secondary | ICD-10-CM | POA: Diagnosis not present

## 2020-07-25 DIAGNOSIS — N250 Renal osteodystrophy: Secondary | ICD-10-CM | POA: Diagnosis not present

## 2020-07-25 DIAGNOSIS — D631 Anemia in chronic kidney disease: Secondary | ICD-10-CM | POA: Diagnosis not present

## 2020-07-25 DIAGNOSIS — Z992 Dependence on renal dialysis: Secondary | ICD-10-CM | POA: Diagnosis not present

## 2020-07-27 ENCOUNTER — Encounter (INDEPENDENT_AMBULATORY_CARE_PROVIDER_SITE_OTHER): Payer: Medicare Other | Admitting: Ophthalmology

## 2020-07-27 ENCOUNTER — Other Ambulatory Visit: Payer: Self-pay | Admitting: Family Medicine

## 2020-07-27 DIAGNOSIS — K219 Gastro-esophageal reflux disease without esophagitis: Secondary | ICD-10-CM

## 2020-07-28 DIAGNOSIS — D631 Anemia in chronic kidney disease: Secondary | ICD-10-CM | POA: Diagnosis not present

## 2020-07-28 DIAGNOSIS — N186 End stage renal disease: Secondary | ICD-10-CM | POA: Diagnosis not present

## 2020-07-28 DIAGNOSIS — Z992 Dependence on renal dialysis: Secondary | ICD-10-CM | POA: Diagnosis not present

## 2020-07-28 DIAGNOSIS — E8779 Other fluid overload: Secondary | ICD-10-CM | POA: Diagnosis not present

## 2020-07-28 DIAGNOSIS — N250 Renal osteodystrophy: Secondary | ICD-10-CM | POA: Diagnosis not present

## 2020-07-29 DIAGNOSIS — E119 Type 2 diabetes mellitus without complications: Secondary | ICD-10-CM | POA: Diagnosis not present

## 2020-07-29 DIAGNOSIS — N186 End stage renal disease: Secondary | ICD-10-CM | POA: Diagnosis not present

## 2020-07-29 DIAGNOSIS — Z992 Dependence on renal dialysis: Secondary | ICD-10-CM | POA: Diagnosis not present

## 2020-07-29 DIAGNOSIS — J449 Chronic obstructive pulmonary disease, unspecified: Secondary | ICD-10-CM | POA: Diagnosis not present

## 2020-07-30 DIAGNOSIS — Z992 Dependence on renal dialysis: Secondary | ICD-10-CM | POA: Diagnosis not present

## 2020-07-30 DIAGNOSIS — E559 Vitamin D deficiency, unspecified: Secondary | ICD-10-CM | POA: Diagnosis not present

## 2020-07-30 DIAGNOSIS — D631 Anemia in chronic kidney disease: Secondary | ICD-10-CM | POA: Diagnosis not present

## 2020-07-30 DIAGNOSIS — D509 Iron deficiency anemia, unspecified: Secondary | ICD-10-CM | POA: Diagnosis not present

## 2020-07-30 DIAGNOSIS — N250 Renal osteodystrophy: Secondary | ICD-10-CM | POA: Diagnosis not present

## 2020-07-30 DIAGNOSIS — N186 End stage renal disease: Secondary | ICD-10-CM | POA: Diagnosis not present

## 2020-07-30 IMAGING — XA IR THROMBECTOMY AV FISTULA W/THROMBOLYSIS/PTA/STENT INC/SHUNT/IM
12 of 24 series · 12 of 24 positions shown · IV contrast (IODINE)
Comparison: none

INDICATION: 48-year-old female with a history of right upper extremity
thrombosed graft

[Series 2: fl (-) angio · 1 of 8 slices shown (1 of 6)]
[im 1/8]
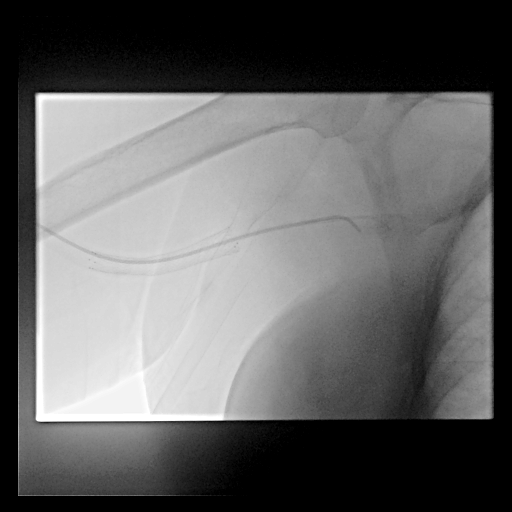

[Series 4: fl (-) angio · 1 of 2 slices shown (2 of 6)]
[im 1/2]
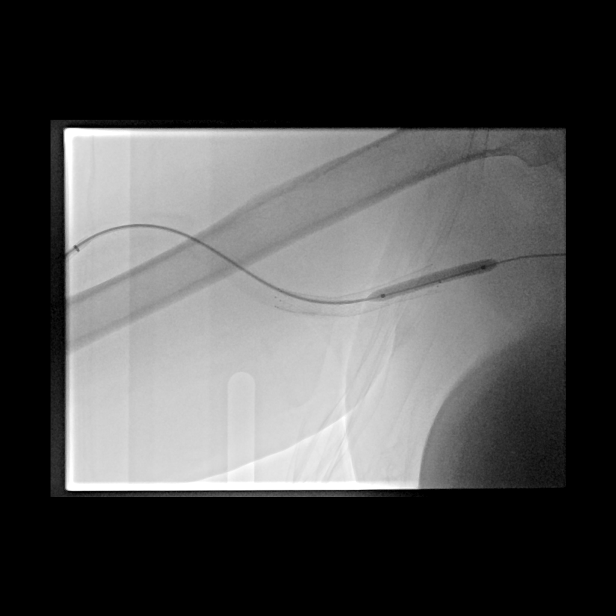

[Series 6: fl (-) angio · 1 of 1 slices shown (3 of 6)]
[im 1/1]
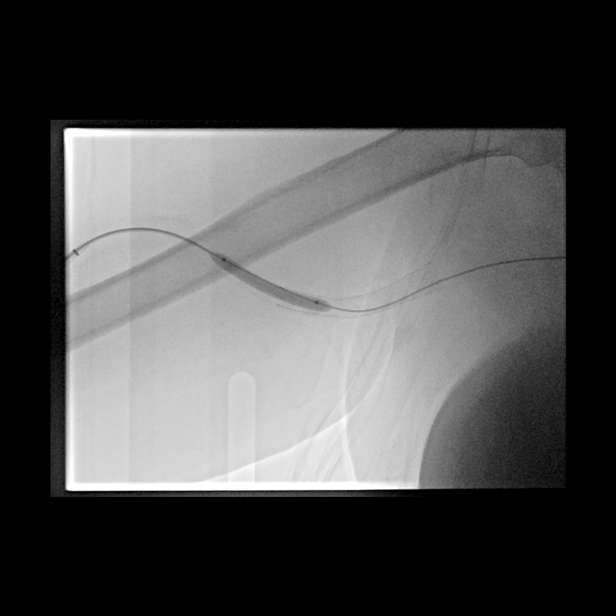

[Series 8: fl (-) angio · 1 of 1 slices shown (4 of 6)]
[im 1/1]
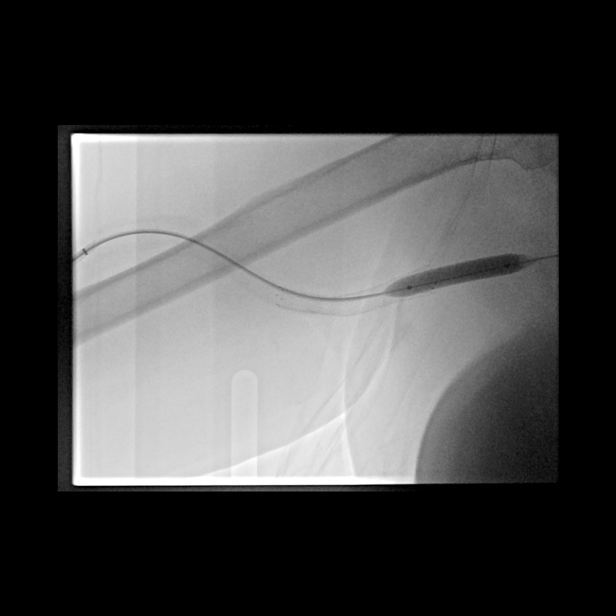

[Series 10: fl (-) angio · 1 of 1 slices shown (5 of 6)]
[im 1/1]
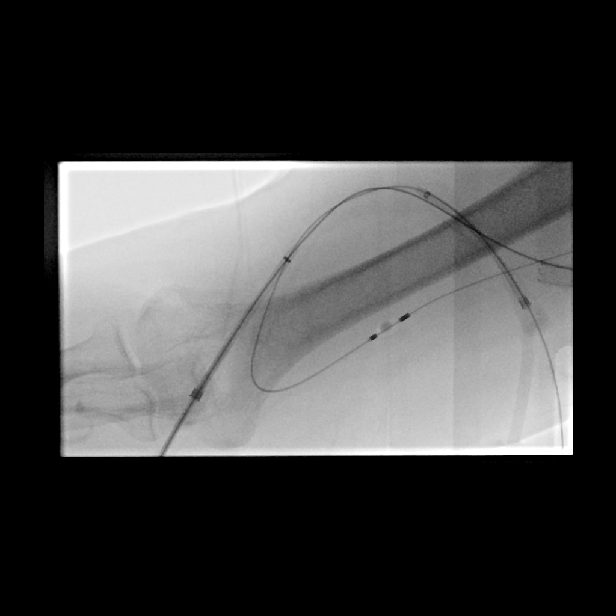

[Series 12: body 4 care · 1 of 10 slices shown (1 of 4)]
[im 1/10]
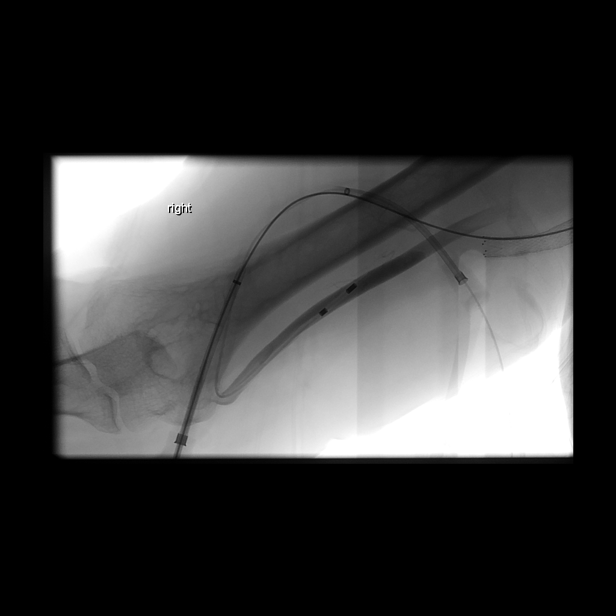

[Series 16: fl (-) angio · 1 of 1 slices shown (6 of 6)]
[im 1/1]
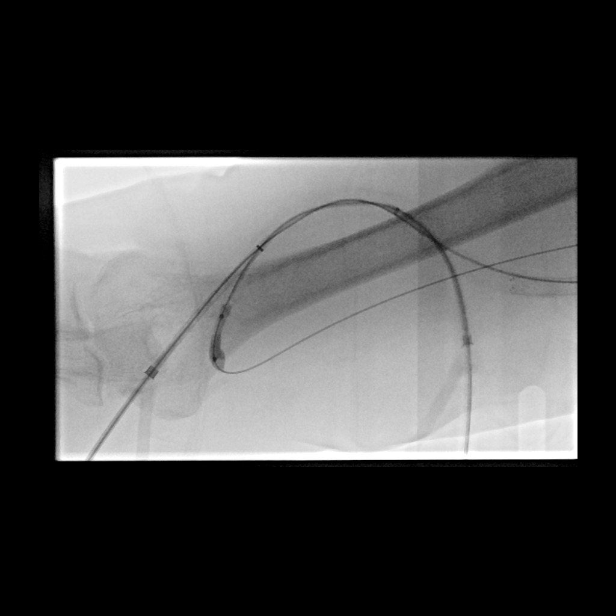

[Series 22: body 4 care · 1 of 5 slices shown (2 of 4)]
[im 1/5]
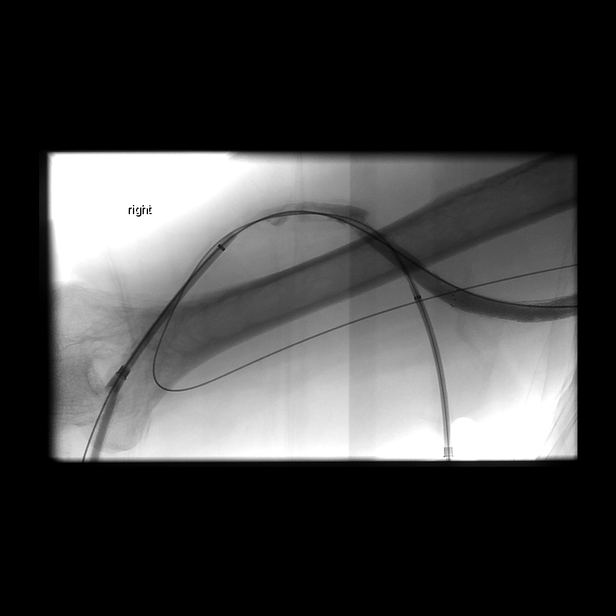

[Series 24: body 4 care · 1 of 12 slices shown (3 of 4)]
[im 1/12]
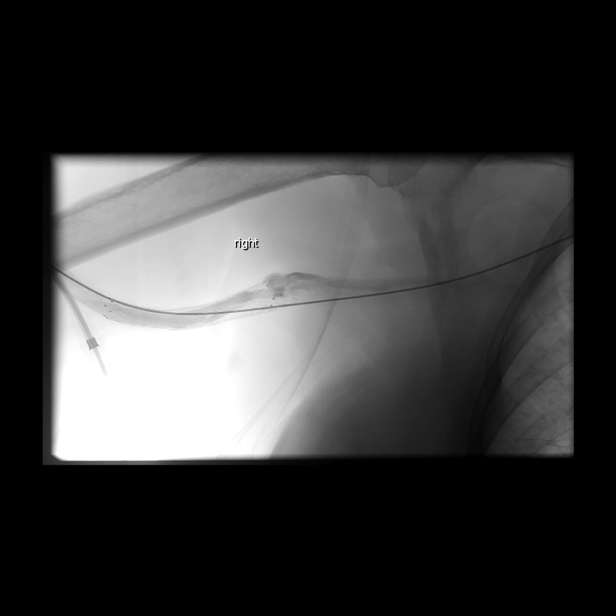

[Series 26: single · 1 of 1 slices shown (1 of 2)]
[im 1/1]
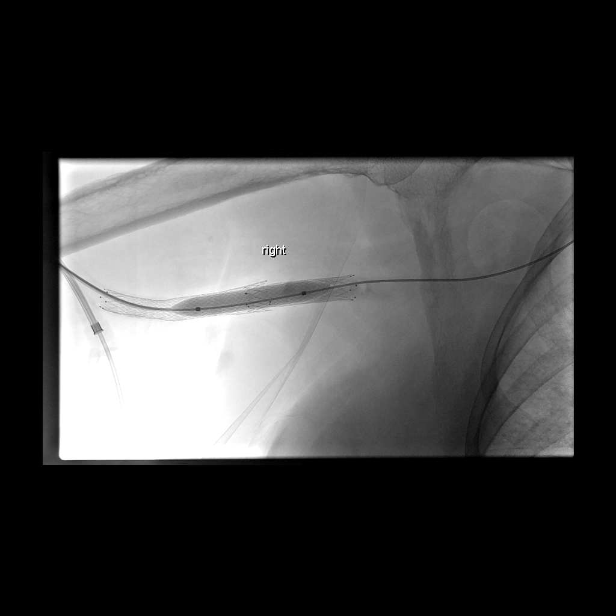

[Series 28: body 4 care · 1 of 10 slices shown (4 of 4)]
[im 1/10]
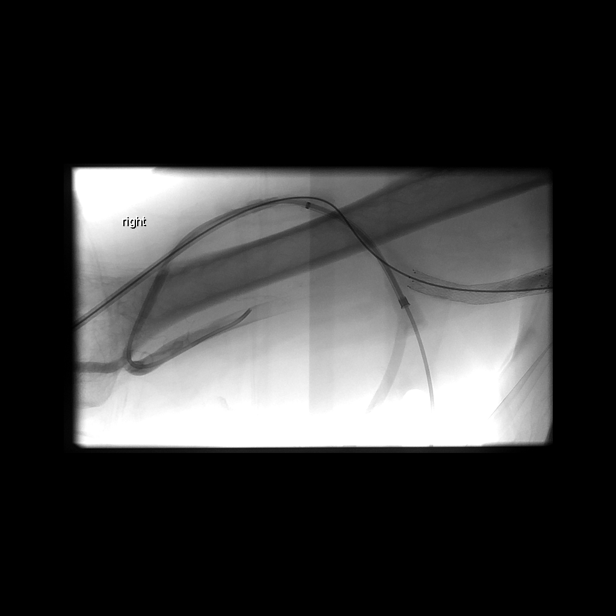

[Series 30: single · 1 of 1 slices shown (2 of 2)]
[im 1/1]
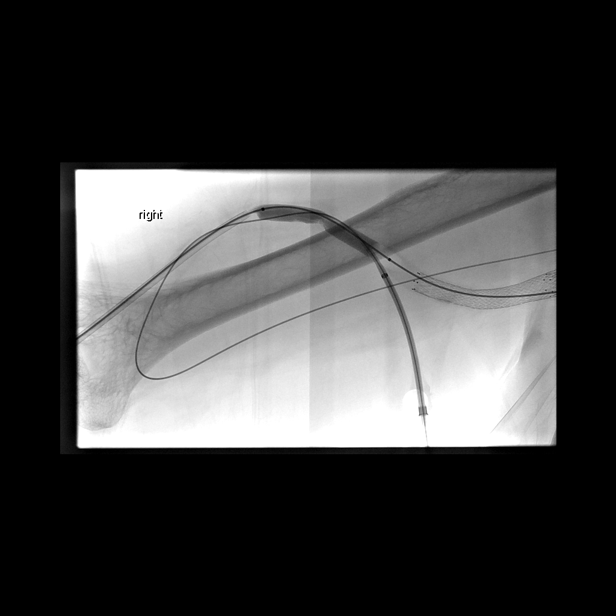

[12 of 24 positions shown; findings below may reference images not displayed]

EXAM:
ULTRASOUND-GUIDED ACCESS X2 OF RIGHT UPPER EXTREMITY GRAFT

MECHANICAL AND PHARMACOLOGIC THROMBECTOMY OF RIGHT UPPER EXTREMITY
GRAFT

BALLOON ANGIOPLASTY OF TANDEM OUTFLOW STENOSES,

DRUG-ELUTING BALLOON TREATMENT OF OUTFLOW STENOSIS

COVERED STENT PLACEMENT OF EDGE STENOSIS OF PREVIOUS AXILLARY VEIN
STENT

MEDICATIONS:
2.5 mg Versed, 125 cc fentanyl

ANESTHESIA/SEDATION:
Moderate Sedation Time:  70 minutes

The patient was continuously monitored during the procedure by the
interventional radiology nurse under my direct supervision.

FLUOROSCOPY TIME:  Fluoroscopy Time: 9 minutes 24 seconds (44 mGy).

COMPLICATIONS:
None

PROCEDURE:
The procedure, risks, benefits, and alternatives were explained to
the patient and the patient's family, including bleeding, infection,
arterial thrombus, venous thrombus, vessel injury, need for further
procedure, need for stenting, contrast reaction, need for catheter
placement, cardiopulmonary collapse, death. Questions regarding the
procedure were encouraged and answered. The patient understands and
consents to the procedure.

Ultrasound survey was performed with images stored and sent to PACs.

The right upper extremity was prepped and draped in the usual
sterile fashion.

Ultrasound guidance was used to access the upper extremity graft
with a micropuncture kit. Exchange was made for a 7 French short
sheath.

A wire was then a navigate into the venous outflow, with an angled
catheter advanced into the venous outflow.

Venogram of the left upper extremity demonstrates patency of the
subclavian vein outflow and central veins.

Upon withdrawal of the angled catheter, a solution of 2 milligrams
of tPA in saline was infused into the outflow portion of the graft.

A 5 mm balloon catheter was used to macerate the thrombosed segment.

5 mm balloon angioplasty was performed of edge stenosis of the
previous stent.

8 mm balloon angioplasty was then performed of the edge stenosis,
matching the diameter of the stent.

Ultrasound guidance was then used access the fistula directed toward
the arterial anastomosis. Once the micro sheath was in place, an
exchange was made for a 6 French short sheath.

Glidewire was navigated across the arterial anastomosis, with an
over the wire Fogarty balloon advanced into the artery. The arterial
plug was pulled, which suggested a stenosis at the arterial
anastomosis.

A 5 millimeter x 4 centimeter balloon was used to balloon
angioplasty the arterial anastomosis.

Given the recalcitrant stenosis, 6 mm by 2 cm balloon was used to
angioplasty the arterial stenosis. The response was limited at this
site, which is favored to represent the outflow graft anastomosis.

Angiogram then demonstrated poor flow through the outflow at the
edge of the previous stent. We elected then to place a 9 mm x 40 mm
covered stent in the venous outflow.

8 mm post angioplasty was performed.

8 mm angioplasty was also performed of tandem outflow stenosis
distal (peripheral) to the prior stent system. Ultimately in
irregular segment with approximately 50% stenosis was treated, just
distal (peripheral) to the prior stent system. This was treated with
8 mm x 60 mm drug-eluting balloon stent.

Repeat fistulogram was then performed.

Excellent thrill was palpated.

Catheters and wires were removed with a complete fistulagram
performed through the central vasculature.

The short sheaths were removed after placement of hemostasis
sutures.

Patient tolerated the procedure well and remained hemodynamically
stable throughout.

No complications were encountered and no significant blood loss was
encountered.
FINDINGS: Thrombosed graft, with tandem outflow stenoses.  This includes:

Central edge stenosis at the margin of the prior stent system,
treated with 9 mm x 40 mm covered stent.

Tandem venous outflow stenoses in the mid graft outflow, treated
with 8 mm balloon angioplasty and 8 mm drug-eluting balloon
angioplasty, respectively, with less than 30% residual at each site.

Restoration of excellent flow and thrill through the graft.
IMPRESSION: Status post ultrasound-guided access of right upper extremity graft
for mechanical and pharmacologic thrombectomy and restoration of
flow of right upper extremity graft. Tandem outflow stenosis and
edge stenosis were treated with balloon angioplasty and stenting of
edge stenosis, as above.

ACCESS:
This access remains amenable to future percutaneous interventions as
clinically indicated.

## 2020-08-01 ENCOUNTER — Other Ambulatory Visit: Payer: Self-pay

## 2020-08-01 ENCOUNTER — Telehealth (INDEPENDENT_AMBULATORY_CARE_PROVIDER_SITE_OTHER): Payer: Medicare Other | Admitting: Psychiatry

## 2020-08-01 ENCOUNTER — Encounter: Payer: Self-pay | Admitting: Psychiatry

## 2020-08-01 ENCOUNTER — Ambulatory Visit (HOSPITAL_COMMUNITY): Payer: Medicare Other | Admitting: Clinical

## 2020-08-01 ENCOUNTER — Telehealth (HOSPITAL_COMMUNITY): Payer: Self-pay | Admitting: Clinical

## 2020-08-01 DIAGNOSIS — F3341 Major depressive disorder, recurrent, in partial remission: Secondary | ICD-10-CM | POA: Diagnosis not present

## 2020-08-01 MED ORDER — SERTRALINE HCL 100 MG PO TABS: 150.0000 mg | ORAL_TABLET | Freq: Every day | ORAL | 0 refills | Status: DC

## 2020-08-01 MED ORDER — ARIPIPRAZOLE 5 MG PO TABS: 5.0000 mg | ORAL_TABLET | Freq: Every day | ORAL | 0 refills | Status: DC

## 2020-08-01 NOTE — Telephone Encounter (Signed)
The patient did not answer phone call. OPT left a VM, however, the patient did not return call missing her scheduled session.

## 2020-08-02 DIAGNOSIS — D631 Anemia in chronic kidney disease: Secondary | ICD-10-CM | POA: Diagnosis not present

## 2020-08-02 DIAGNOSIS — E559 Vitamin D deficiency, unspecified: Secondary | ICD-10-CM | POA: Diagnosis not present

## 2020-08-02 DIAGNOSIS — D509 Iron deficiency anemia, unspecified: Secondary | ICD-10-CM | POA: Diagnosis not present

## 2020-08-02 DIAGNOSIS — Z992 Dependence on renal dialysis: Secondary | ICD-10-CM | POA: Diagnosis not present

## 2020-08-02 DIAGNOSIS — N186 End stage renal disease: Secondary | ICD-10-CM | POA: Diagnosis not present

## 2020-08-02 DIAGNOSIS — E119 Type 2 diabetes mellitus without complications: Secondary | ICD-10-CM | POA: Diagnosis not present

## 2020-08-02 DIAGNOSIS — N250 Renal osteodystrophy: Secondary | ICD-10-CM | POA: Diagnosis not present

## 2020-08-02 DIAGNOSIS — Z794 Long term (current) use of insulin: Secondary | ICD-10-CM | POA: Diagnosis not present

## 2020-08-04 DIAGNOSIS — Z992 Dependence on renal dialysis: Secondary | ICD-10-CM | POA: Diagnosis not present

## 2020-08-04 DIAGNOSIS — D509 Iron deficiency anemia, unspecified: Secondary | ICD-10-CM | POA: Diagnosis not present

## 2020-08-04 DIAGNOSIS — D631 Anemia in chronic kidney disease: Secondary | ICD-10-CM | POA: Diagnosis not present

## 2020-08-04 DIAGNOSIS — N250 Renal osteodystrophy: Secondary | ICD-10-CM | POA: Diagnosis not present

## 2020-08-04 DIAGNOSIS — E559 Vitamin D deficiency, unspecified: Secondary | ICD-10-CM | POA: Diagnosis not present

## 2020-08-04 DIAGNOSIS — N186 End stage renal disease: Secondary | ICD-10-CM | POA: Diagnosis not present

## 2020-08-06 DIAGNOSIS — D631 Anemia in chronic kidney disease: Secondary | ICD-10-CM | POA: Diagnosis not present

## 2020-08-06 DIAGNOSIS — N250 Renal osteodystrophy: Secondary | ICD-10-CM | POA: Diagnosis not present

## 2020-08-06 DIAGNOSIS — N186 End stage renal disease: Secondary | ICD-10-CM | POA: Diagnosis not present

## 2020-08-06 DIAGNOSIS — Z992 Dependence on renal dialysis: Secondary | ICD-10-CM | POA: Diagnosis not present

## 2020-08-06 DIAGNOSIS — E559 Vitamin D deficiency, unspecified: Secondary | ICD-10-CM | POA: Diagnosis not present

## 2020-08-06 DIAGNOSIS — D509 Iron deficiency anemia, unspecified: Secondary | ICD-10-CM | POA: Diagnosis not present

## 2020-08-08 ENCOUNTER — Ambulatory Visit (INDEPENDENT_AMBULATORY_CARE_PROVIDER_SITE_OTHER): Payer: Medicare Other | Admitting: Ophthalmology

## 2020-08-08 ENCOUNTER — Encounter (INDEPENDENT_AMBULATORY_CARE_PROVIDER_SITE_OTHER): Payer: Self-pay | Admitting: Ophthalmology

## 2020-08-08 ENCOUNTER — Other Ambulatory Visit: Payer: Self-pay

## 2020-08-08 DIAGNOSIS — H4311 Vitreous hemorrhage, right eye: Secondary | ICD-10-CM

## 2020-08-08 DIAGNOSIS — E113511 Type 2 diabetes mellitus with proliferative diabetic retinopathy with macular edema, right eye: Secondary | ICD-10-CM

## 2020-08-08 MED ORDER — BEVACIZUMAB 2.5 MG/0.1ML IZ SOSY
2.5000 mg | PREFILLED_SYRINGE | INTRAVITREAL | Status: AC | PRN
  Administered 2020-08-08: 2.5 mg via INTRAVITREAL

## 2020-08-08 NOTE — Assessment & Plan Note (Signed)
Recurrent PDR, CSME with recurrent vitreous hemorrhages multiple in the past which occlude this patient's monocular status.  For this reason repeat intravitreal Avastin today again at 45-monthfollow-up, likely injection Avastin next

## 2020-08-08 NOTE — Progress Notes (Signed)
08/08/2020     CHIEF COMPLAINT Patient presents for Retina Follow Up (3 MO FU OU////Pt reports stable vision OU, no new F/F OU, no pain or pressure OU./////Last A1C: 7.5  taken 05/2020///Last BS: 159 taken yesterday. )   HISTORY OF PRESENT ILLNESS: Kara Knapp is a 50 y.o. female who presents to the clinic today for:   HPI    Retina Follow Up    Patient presents with  Diabetic Retinopathy.  In both eyes.  This started 3 months ago.  Duration of 3 months.  Since onset it is stable. Additional comments: 3 MO FU OU    Pt reports stable vision OU, no new F/F OU, no pain or pressure OU.     Last A1C: 7.5  taken 05/2020   Last BS: 159 taken yesterday.        Last edited by Nichola Sizer D on 08/08/2020 10:43 AM. (History)      Referring physician: Medicine, [facility] [place],  [place] [address]  HISTORICAL INFORMATION:   Selected notes from the MEDICAL RECORD NUMBER    Lab Results  Component Value Date   HGBA1C 7.2 (H) 08/29/2018     CURRENT MEDICATIONS: Current Outpatient Medications (Ophthalmic Drugs)  Medication Sig  . COMBIGAN 0.2-0.5 % ophthalmic solution INSTILL ONE DROP IN [facility] EYE TWICE DAILY (Patient taking differently: 1 drop once.)   No current facility-administered medications for this visit. (Ophthalmic Drugs)   Current Outpatient Medications (Other)  Medication Sig  . acetaminophen (TYLENOL) 500 MG tablet Take 1,000 mg by mouth every 6 (six) hours as needed for moderate pain or headache.  Derrill Memo ON 09/15/2020] ARIPiprazole (ABILIFY) 5 MG tablet Take 1 tablet (5 mg total) by mouth daily.  Marland Kitchen azelastine (ASTELIN) 0.1 % nasal spray Place 1 spray into both nostrils 2 (two) times daily. Use in each nostril as directed (Patient taking differently: Place 1 spray into both nostrils daily as needed for rhinitis. )  . buPROPion HCl (WELLBUTRIN XL PO) Take by mouth. (Patient not taking: Reported on 06/06/2020)  . carvedilol (COREG) 12.5 MG  tablet Take 12.5 mg by mouth 2 (two) times daily.  . cholecalciferol (VITAMIN D) 1000 units tablet Take 1,000 Units by mouth daily.   . Continuous Blood Gluc Sensor (FREESTYLE LIBRE 14 DAY SENSOR) MISC Inject 1 each into the skin every 14 (fourteen) days. Use as directed.  . cyclobenzaprine (FLEXERIL) 5 MG tablet TAKE ONE TABLET BY MOUTH THREE TIMES DAILY AS NEEDED FOR MUSCLE SPASM  . diclofenac sodium (VOLTAREN) 1 % GEL Apply 2 g topically 4 (four) times daily. (Patient taking differently: Apply 2 g topically 4 (four) times daily as needed (pain). )  . gabapentin (NEURONTIN) 100 MG capsule Take 1 capsule (100 mg total) by mouth 3 (three) times daily. (Needs to be seen before next refill)  . glucose blood (PRODIGY NO CODING BLOOD GLUC) test strip USE TO CHECK BLOOD SUGAR TWICE DAILY. Dx E11.22  . ibuprofen (ADVIL) 200 MG tablet Take 600 mg by mouth every 6 (six) hours as needed for headache or moderate pain.  . Insulin Pen Needle (TRUEPLUS PEN NEEDLES) 31G X 5 MM MISC Use daily with insulin Dx E11.22  . LANTUS SOLOSTAR 100 UNIT/ML Solostar Pen INJECT 18-50 UNITS SUBCUTANEOUSLY AT BEDTIME. (Patient taking differently: Inject 20 Units into the skin at bedtime. )  . levofloxacin (LEVAQUIN) 750 MG tablet   . lidocaine-prilocaine (EMLA) cream Apply 1 application topically daily as needed (arm port). (  Patient not taking: Reported on 05/30/2020)  . linagliptin (TRADJENTA) 5 MG TABS tablet Take 1 tablet (5 mg total) by mouth daily. (Patient taking differently: Take 5 mg by mouth every evening. )  . multivitamin (RENA-VIT) TABS tablet Take 1 tablet by mouth daily.  . pantoprazole (PROTONIX) 40 MG tablet TAKE 1 BY MOUTH DAILY 30 minutes before breakfast (Patient taking differently: Take 40 mg by mouth daily. )  . Probiotic Product (CULTURELLE PROBIOTICS PO) Take 1 capsule by mouth daily.   Marland Kitchen PRODIGY TWIST TOP LANCETS 28G MISC USE TO CHECK BLOOD SUGAR UP TO FOUR TIMES DAILY.  Derrill Memo ON 09/06/2020] sertraline  (ZOLOFT) 100 MG tablet Take 1.5 tablets (150 mg total) by mouth daily.  . sevelamer carbonate (RENVELA) 800 MG tablet Take 800-2,400 mg by mouth See admin instructions. Take 2473m by mouth three times daily with means and 8014mwith snacks  . SYMBICORT 160-4.5 MCG/ACT inhaler   . VENTOLIN HFA 108 (90 Base) MCG/ACT inhaler    No current facility-administered medications for this visit. (Other)      REVIEW OF SYSTEMS:    ALLERGIES Allergies  Allergen Reactions  . Sulfamethoxazole-Trimethoprim Nausea And Vomiting  . Bactrim [Sulfamethoxazole-Trimethoprim] Nausea And Vomiting  . Prednisone Other (See Comments)    "I was wide open and couldn't eat" per pt. Loss of appetite and insomnia  LOSS OF APPETITE,UNABLE TO SLEEP    PAST MEDICAL HISTORY Past Medical History:  Diagnosis Date  . Anemia of chronic disease   . Anxiety   . Asthma   . Blind left eye   . Bronchitis   . Cataract   . Cholecystitis, acute 05/26/2013   Status post cholecystectomy  . Chronic abdominal pain   . Chronic diarrhea   . COPD (chronic obstructive pulmonary disease) ([facility]  . Depression   . Diabetic foot ulcer (HC[place]08/08/2014  . Diastolic heart failure ([facility]  . ESRD on [facility]ialysis ([facility]   Started diaylsis 12/29/15  . Essential hypertension   . Fibroids   . Glaucoma   . History of blood transfusion   . History of pneumonia   . Hyperlipidemia   . Insulin-dependent diabetes mellitus with retinopathy   . Liver fibrosis    Negative Hep B surface antigen, negative Hep C antibody Feb 2018 (see scanned in labs).  . Neuropathy   . Osteomyelitis ([facility]   Toe on left foot   Past Surgical History:  Procedure Laterality Date  . A/V SHUNTOGRAM N/A 10/25/2016   Procedure: A/V Shuntogram - Right Arm;  Surgeon: BrWaynetta SandyMD;  [facility];  Service: Cardiovascular;  Laterality: N/A;  . A/V SHUNTOGRAM N/A 03/05/2018   Procedure: A/V SHUNTOGRAM - Right Arm;  Surgeon: CaWaynetta SandyMD;  [facility]: [facility];  Service: Cardiovascular;  Laterality: N/A;  . AV FISTULA PLACEMENT Right 10/17/2015   Procedure: INSERTION OF ARTERIOVENOUS GORE-TEX GRAFT RIGHT UPPER ARM WITH ACUSEAL;  Surgeon: BrConrad BurlingtonMD;  [facility];  Service: Vascular;  Laterality: Right;  . AV FISTULA PLACEMENT Left 11/18/2019   Procedure: INSERTION OF ARTERIOVENOUS (AV) GORE-TEX GRAFT left  ARM;  Surgeon: BrSerafina MitchellMD;  [facility]: [facility] Service: Vascular;  Laterality: Left;  . CATARACT EXTRACTION W/ INTRAOCULAR LENS IMPLANT Bilateral   . CESAREAN SECTION    . CHOLECYSTECTOMY N/A 05/25/2013   Procedure: LAPAROSCOPIC CHOLECYSTECTOMY;  Surgeon: MaJamesetta SoMD;  Location: [facility];  Service: General;  Laterality:  N/A;  . COLONOSCOPY WITH PROPOFOL N/A 10/02/2016   two 3 to 5 mm polyps in the descending colon, three 2 to 3 mm polyps in the rectum, random colon biopsies, rectal bleeding due to internal hemorrhoids, friability with no bleeding at the anus status post biopsy.  Surgical pathology found the polyps to be a mix of hyperplastic and tubular adenoma, random colon biopsies to be benign colonic mucosa, and the anal biopsies to be anal skin tag.   Marland Kitchen ESOPHAGOGASTRODUODENOSCOPY (EGD) WITH PROPOFOL N/A 10/02/2016   mucosal nodule in the esophagus status post biopsy, moderate gastritis status post biopsy, mild duodenitis. esophageal biopsy to be benign, gastric biopsies to be gastritis due to aspirin use, and duodenal biopsies to be duodenitis due to aspirin use  . EYE SURGERY Bilateral   . IR FLUORO GUIDE CV LINE RIGHT  10/09/2019  . IR REMOVAL TUN CV CATH W/O FL  01/15/2020  . IR THROMBECTOMY AV FISTULA W/THROMBOLYSIS/PTA INC/SHUNT/IMG RIGHT Right 12/26/2018  . IR THROMBECTOMY AV FISTULA W/THROMBOLYSIS/PTA INC/SHUNT/IMG RIGHT Right 08/10/2019  . IR THROMBECTOMY AV FISTULA W/THROMBOLYSIS/PTA/STENT INC/SHUNT/IMG RT Right 08/08/2018  . IR THROMBECTOMY AV FISTULA  W/THROMBOLYSIS/PTA/STENT INC/SHUNT/IMG RT Right 05/15/2019  . IR US GUIDE VASC ACCESS RIGHT  08/08/2018  . IR US GUIDE VASC ACCESS RIGHT  12/26/2018  . IR US GUIDE VASC ACCESS RIGHT  05/15/2019  . IR US GUIDE VASC ACCESS RIGHT  08/10/2019  . IR US GUIDE VASC ACCESS RIGHT  10/09/2019  . PARS PLANA VITRECTOMY Left 11/24/2014   Procedure: PARS PLANA VITRECTOMY WITH 25 GAUGE;  Surgeon: Hurman Horn, MD;  Location: [place];  Service: Ophthalmology;  Laterality: Left;  . PERIPHERAL VASCULAR BALLOON ANGIOPLASTY Right 03/05/2018   Procedure: PERIPHERAL VASCULAR BALLOON ANGIOPLASTY;  Surgeon: Waynetta Sandy, MD;  Location: [place] [facility];  Service: Cardiovascular;  Laterality: Right;  arm fistula  . PERIPHERAL VASCULAR CATHETERIZATION N/A 04/28/2015   Procedure: Bilateral Upper Extremity Venography;  Surgeon: Conrad Kulpmont, MD;  Location: [place] [facility];  Service: Cardiovascular;  Laterality: N/A;  . PHOTOCOAGULATION WITH LASER Left 11/24/2014   Procedure: PHOTOCOAGULATION WITH LASER;  Surgeon: Hurman Horn, MD;  Location: [place];  Service: Ophthalmology;  Laterality: Left;  with insertion of silicone oil  . SAVORY DILATION N/A 10/02/2016   Procedure: SAVORY DILATION;  Surgeon: Danie Binder, MD;  [facility];  Service: Endoscopy;  Laterality: N/A;  . TUBAL LIGATION    . UPPER EXTREMITY VENOGRAPHY Bilateral 11/02/2019   Procedure: UPPER EXTREMITY VENOGRAPHY;  Surgeon: Waynetta Sandy, MD;  Location: [place] [facility];  Service: Cardiovascular;  Laterality: Bilateral;    FAMILY HISTORY Family History  Problem Relation Age of Onset  . COPD Mother   . Cancer Father   . Lymphoma Father   . Diabetes Sister   . Deep vein thrombosis Sister   . Diabetes Brother   . Hyperlipidemia Brother   . Hypertension Brother   . Mental retardation Sister   . Alcohol abuse Paternal Grandmother   . Colon cancer Neg Hx   . Liver disease Neg Hx     SOCIAL HISTORY Social History    Tobacco Use  . Smoking status: Current Every Day Smoker    Packs/day: 1.00    Years: 23.00    Pack years: 23.00    Types: Cigarettes    Start date: 12/03/2000  . Smokeless tobacco: Never Used  . Tobacco comment: one pack daily  Vaping Use  . Vaping  Use: Never used  Substance Use Topics  . Alcohol use: No    Alcohol/week: 0.0 standard drinks  . Drug use: No    Comment: Sober for 8 years         OPHTHALMIC EXAM: Base Eye Exam    Visual Acuity (ETDRS)      Right Left   Dist [place] 20/100 -1 HM   Dist ph [place] NI        Tonometry (Tonopen, 10:48 AM)      Right Left   Pressure 19 46       Tonometry #2 (Tonopen, 10:49 AM)      Right Left   Pressure  42       Pupils      Shape React APD   Right Round Brisk None   Left Round Minimal None       Visual Fields (Counting fingers)      Left Right   Restrictions Total superior temporal, inferior temporal, superior nasal, inferior nasal deficiencies Total superior temporal, inferior temporal, superior nasal, inferior nasal deficiencies       Extraocular Movement      Right Left    Full Full       Neuro/Psych    Oriented x3: Yes   Mood/Affect: Normal       Dilation    Right eye: 1.0% Mydriacyl, 2.5% Phenylephrine @ 10:50 AM        Slit Lamp and Fundus Exam    Slit Lamp Exam      Right Left   Lids/Lashes Normal Normal   Conjunctiva/Sclera White and quiet White and quiet   Cornea Clear Clear   Anterior Chamber Tube, ST Quad Deep and quiet, glaucoma tube AC   Iris Round and reactive Old and active NVI   Lens Posterior chamber intraocular lens, Open posterior capsule Posterior chamber intraocular lens   Anterior Vitreous Dear Silicone oil       Fundus Exam      Right Left   Posterior Vitreous Vitrectomized yet Clear silicone   Disc Old fibrovascular disease of the nerve, 3+ Pallor White nerve   C/D Ratio 0.3 Atrophic   Macula macular thickening, Microaneurysms, no exudates, Focal laser scars Attached    Vessels PDR-quiet PDR-quiet   Periphery Laser scars, Laser scars,          IMAGING AND PROCEDURES  Imaging and Procedures for 08/08/20  OCT, Retina - OU - Both Eyes       Right Eye Quality was good. Scan locations included subfoveal. Central Foveal Thickness: 217. Progression has been stable. Findings include abnormal foveal contour.   Left Eye Quality was borderline. Scan locations included temporal. Progression has been stable. Findings include abnormal foveal contour.   Notes History of CSME OD, stabilized and improved on recurrent intravitreal Avastin also to prevent recurrent microscopic vitreous hemorrhages in this monocular patient       Intravitreal Injection, Pharmacologic Agent - OD - Right Eye       Time Out 08/08/2020. 11:14 AM. Confirmed correct patient, procedure, site, and patient consented.   Anesthesia Anesthetic medications included Akten 3.5%, Proparacaine 0.5%.   Procedure Preparation included Tobramycin 0.3%, 10% betadine to eyelids. A supplied needle was used.   Injection:  2.5 mg Bevacizumab (AVASTIN) 2.72m/0.1mL SOSY   NDC: 763893-734-28 Lot:: [id number]  Route: Intravitreal, Site: Right Eye  Post-op Post injection exam found visual acuity of at least counting fingers. The patient tolerated the procedure well. There were no complications.  The patient received written and verbal post procedure care education. Post injection medications were not given.                 ASSESSMENT/PLAN:  Diabetic macular edema of right eye with proliferative retinopathy associated with type 2 diabetes mellitus (HCC) Recurrent PDR, CSME with recurrent vitreous hemorrhages multiple in the past which occlude this patient's monocular status.  For this reason repeat intravitreal Avastin today again at 53-monthfollow-up, likely injection Avastin next  Vitreous hemorrhage of right eye ([place] Resolved today and multiple recurrences over the previous 5 years,  stabilized and preventable with repeat quarterly injections Avastin      ICD-10-CM   1. Diabetic macular edema of right eye with proliferative retinopathy associated with type 2 diabetes mellitus (HCC)  E11.3511 OCT, Retina - OU - Both Eyes    Intravitreal Injection, Pharmacologic Agent - OD - Right Eye    bevacizumab (AVASTIN) SOSY 2.5 mg  2. Vitreous hemorrhage of right eye (HCC)  H43.11     1.  Repeat injection intravitreal Avastin OD today for longstanding history of recurrent CSME from PDR, PDR is quiescent however  2.  Visual acuity stabilizes there is no vitreous hemorrhage recurring on quarterly injections and evaluation  3.  History of no light perception vision left eye secondary to end-stage neovascular disease retinal nonperfusion left eye, previous complex retinal detachment  atrophic periphery and macula and nerve Ophthalmic Meds Ordered this visit:  Meds ordered this encounter  Medications  . bevacizumab (AVASTIN) SOSY 2.5 mg       Return in about 3 months (around 11/06/2020) for DILATE OU, AVASTIN OCT, OD.  There are no Patient Instructions on file for this visit.   Explained the diagnoses, plan, and follow up with the patient and they expressed understanding.  Patient expressed understanding of the importance of proper follow up care.   GClent DemarkRankin M.D. Diseases & Surgery of the Retina and Vitreous Retina & Diabetic EBoyle01/10/22     Abbreviations: M myopia (nearsighted); A astigmatism; H hyperopia (farsighted); P presbyopia; Mrx spectacle prescription;  CTL contact lenses; OD right eye; OS left eye; OU both eyes  XT exotropia; ET esotropia; PEK punctate epithelial keratitis; PEE punctate epithelial erosions; DES dry eye syndrome; MGD meibomian gland dysfunction; ATs artificial tears; PFAT's preservative free artificial tears; NPetrolianuclear sclerotic cataract; PSC posterior subcapsular cataract; ERM epi-retinal membrane; PVD posterior vitreous detachment;  RD retinal detachment; DM diabetes mellitus; DR diabetic retinopathy; NPDR non-proliferative diabetic retinopathy; PDR proliferative diabetic retinopathy; CSME clinically significant macular edema; DME diabetic macular edema; dbh dot blot hemorrhages; CWS cotton wool spot; POAG primary open angle glaucoma; C/D cup-to-disc ratio; HVF humphrey visual field; GVF goldmann visual field; OCT optical coherence tomography; IOP intraocular pressure; BRVO Branch retinal vein occlusion; CRVO central retinal vein occlusion; CRAO central retinal artery occlusion; BRAO branch retinal artery occlusion; RT retinal tear; SB scleral buckle; PPV pars plana vitrectomy; VH Vitreous hemorrhage; PRP panretinal laser photocoagulation; IVK intravitreal kenalog; VMT vitreomacular traction; MH Macular hole;  NVD neovascularization of the disc; NVE neovascularization elsewhere; AREDS age related eye disease study; ARMD age related macular degeneration; POAG primary open angle glaucoma; EBMD epithelial/anterior basement membrane dystrophy; ACIOL anterior chamber intraocular lens; IOL intraocular lens; PCIOL posterior chamber intraocular lens; Phaco/IOL phacoemulsification with intraocular lens placement; PLa Chuparosaphotorefractive keratectomy; LASIK laser assisted in situ keratomileusis; HTN hypertension; DM diabetes mellitus; COPD chronic obstructive pulmonary disease

## 2020-08-08 NOTE — Assessment & Plan Note (Signed)
Resolved today and multiple recurrences over the previous 5 years, stabilized and preventable with repeat quarterly injections Avastin

## 2020-08-09 DIAGNOSIS — D509 Iron deficiency anemia, unspecified: Secondary | ICD-10-CM | POA: Diagnosis not present

## 2020-08-09 DIAGNOSIS — N250 Renal osteodystrophy: Secondary | ICD-10-CM | POA: Diagnosis not present

## 2020-08-09 DIAGNOSIS — D631 Anemia in chronic kidney disease: Secondary | ICD-10-CM | POA: Diagnosis not present

## 2020-08-09 DIAGNOSIS — E559 Vitamin D deficiency, unspecified: Secondary | ICD-10-CM | POA: Diagnosis not present

## 2020-08-09 DIAGNOSIS — Z992 Dependence on renal dialysis: Secondary | ICD-10-CM | POA: Diagnosis not present

## 2020-08-09 DIAGNOSIS — N186 End stage renal disease: Secondary | ICD-10-CM | POA: Diagnosis not present

## 2020-08-13 DIAGNOSIS — D631 Anemia in chronic kidney disease: Secondary | ICD-10-CM | POA: Diagnosis not present

## 2020-08-13 DIAGNOSIS — N186 End stage renal disease: Secondary | ICD-10-CM | POA: Diagnosis not present

## 2020-08-13 DIAGNOSIS — E559 Vitamin D deficiency, unspecified: Secondary | ICD-10-CM | POA: Diagnosis not present

## 2020-08-13 DIAGNOSIS — Z992 Dependence on renal dialysis: Secondary | ICD-10-CM | POA: Diagnosis not present

## 2020-08-13 DIAGNOSIS — D509 Iron deficiency anemia, unspecified: Secondary | ICD-10-CM | POA: Diagnosis not present

## 2020-08-13 DIAGNOSIS — N250 Renal osteodystrophy: Secondary | ICD-10-CM | POA: Diagnosis not present

## 2020-08-16 DIAGNOSIS — D631 Anemia in chronic kidney disease: Secondary | ICD-10-CM | POA: Diagnosis not present

## 2020-08-16 DIAGNOSIS — N250 Renal osteodystrophy: Secondary | ICD-10-CM | POA: Diagnosis not present

## 2020-08-16 DIAGNOSIS — N186 End stage renal disease: Secondary | ICD-10-CM | POA: Diagnosis not present

## 2020-08-16 DIAGNOSIS — E559 Vitamin D deficiency, unspecified: Secondary | ICD-10-CM | POA: Diagnosis not present

## 2020-08-16 DIAGNOSIS — D509 Iron deficiency anemia, unspecified: Secondary | ICD-10-CM | POA: Diagnosis not present

## 2020-08-16 DIAGNOSIS — Z992 Dependence on renal dialysis: Secondary | ICD-10-CM | POA: Diagnosis not present

## 2020-08-18 ENCOUNTER — Other Ambulatory Visit: Payer: Self-pay

## 2020-08-18 DIAGNOSIS — E559 Vitamin D deficiency, unspecified: Secondary | ICD-10-CM | POA: Diagnosis not present

## 2020-08-18 DIAGNOSIS — N186 End stage renal disease: Secondary | ICD-10-CM | POA: Diagnosis not present

## 2020-08-18 DIAGNOSIS — D631 Anemia in chronic kidney disease: Secondary | ICD-10-CM | POA: Diagnosis not present

## 2020-08-18 DIAGNOSIS — D509 Iron deficiency anemia, unspecified: Secondary | ICD-10-CM | POA: Diagnosis not present

## 2020-08-18 DIAGNOSIS — Z992 Dependence on renal dialysis: Secondary | ICD-10-CM | POA: Diagnosis not present

## 2020-08-18 DIAGNOSIS — N250 Renal osteodystrophy: Secondary | ICD-10-CM | POA: Diagnosis not present

## 2020-08-18 DIAGNOSIS — Z8619 Personal history of other infectious and parasitic diseases: Secondary | ICD-10-CM

## 2020-08-18 NOTE — Progress Notes (Signed)
Labs mailed to the pt.

## 2020-08-20 DIAGNOSIS — N250 Renal osteodystrophy: Secondary | ICD-10-CM | POA: Diagnosis not present

## 2020-08-20 DIAGNOSIS — D631 Anemia in chronic kidney disease: Secondary | ICD-10-CM | POA: Diagnosis not present

## 2020-08-20 DIAGNOSIS — E559 Vitamin D deficiency, unspecified: Secondary | ICD-10-CM | POA: Diagnosis not present

## 2020-08-20 DIAGNOSIS — D509 Iron deficiency anemia, unspecified: Secondary | ICD-10-CM | POA: Diagnosis not present

## 2020-08-20 DIAGNOSIS — N186 End stage renal disease: Secondary | ICD-10-CM | POA: Diagnosis not present

## 2020-08-20 DIAGNOSIS — Z992 Dependence on renal dialysis: Secondary | ICD-10-CM | POA: Diagnosis not present

## 2020-08-23 DIAGNOSIS — N250 Renal osteodystrophy: Secondary | ICD-10-CM | POA: Diagnosis not present

## 2020-08-23 DIAGNOSIS — E559 Vitamin D deficiency, unspecified: Secondary | ICD-10-CM | POA: Diagnosis not present

## 2020-08-23 DIAGNOSIS — N186 End stage renal disease: Secondary | ICD-10-CM | POA: Diagnosis not present

## 2020-08-23 DIAGNOSIS — D631 Anemia in chronic kidney disease: Secondary | ICD-10-CM | POA: Diagnosis not present

## 2020-08-23 DIAGNOSIS — D509 Iron deficiency anemia, unspecified: Secondary | ICD-10-CM | POA: Diagnosis not present

## 2020-08-23 DIAGNOSIS — Z992 Dependence on renal dialysis: Secondary | ICD-10-CM | POA: Diagnosis not present

## 2020-08-25 DIAGNOSIS — D631 Anemia in chronic kidney disease: Secondary | ICD-10-CM | POA: Diagnosis not present

## 2020-08-25 DIAGNOSIS — N186 End stage renal disease: Secondary | ICD-10-CM | POA: Diagnosis not present

## 2020-08-25 DIAGNOSIS — E559 Vitamin D deficiency, unspecified: Secondary | ICD-10-CM | POA: Diagnosis not present

## 2020-08-25 DIAGNOSIS — N250 Renal osteodystrophy: Secondary | ICD-10-CM | POA: Diagnosis not present

## 2020-08-25 DIAGNOSIS — Z992 Dependence on renal dialysis: Secondary | ICD-10-CM | POA: Diagnosis not present

## 2020-08-25 DIAGNOSIS — D509 Iron deficiency anemia, unspecified: Secondary | ICD-10-CM | POA: Diagnosis not present

## 2020-08-26 DIAGNOSIS — N250 Renal osteodystrophy: Secondary | ICD-10-CM | POA: Diagnosis not present

## 2020-08-26 DIAGNOSIS — E559 Vitamin D deficiency, unspecified: Secondary | ICD-10-CM | POA: Diagnosis not present

## 2020-08-26 DIAGNOSIS — D509 Iron deficiency anemia, unspecified: Secondary | ICD-10-CM | POA: Diagnosis not present

## 2020-08-26 DIAGNOSIS — Z992 Dependence on renal dialysis: Secondary | ICD-10-CM | POA: Diagnosis not present

## 2020-08-26 DIAGNOSIS — N186 End stage renal disease: Secondary | ICD-10-CM | POA: Diagnosis not present

## 2020-08-26 DIAGNOSIS — D631 Anemia in chronic kidney disease: Secondary | ICD-10-CM | POA: Diagnosis not present

## 2020-08-29 ENCOUNTER — Encounter: Payer: Self-pay | Admitting: Gastroenterology

## 2020-08-29 ENCOUNTER — Ambulatory Visit: Payer: Medicare Other | Admitting: Gastroenterology

## 2020-08-29 DIAGNOSIS — N186 End stage renal disease: Secondary | ICD-10-CM | POA: Diagnosis not present

## 2020-08-29 DIAGNOSIS — J449 Chronic obstructive pulmonary disease, unspecified: Secondary | ICD-10-CM | POA: Diagnosis not present

## 2020-08-29 DIAGNOSIS — Z992 Dependence on renal dialysis: Secondary | ICD-10-CM | POA: Diagnosis not present

## 2020-08-29 DIAGNOSIS — E119 Type 2 diabetes mellitus without complications: Secondary | ICD-10-CM | POA: Diagnosis not present

## 2020-08-29 NOTE — Progress Notes (Deleted)
Primary Care Physician: Medicine, [facility]  Primary Gastroenterologist:  Elon Alas. Abbey Chatters, DO   No chief complaint on file.   HPI: Kara Knapp is a 50 y.o. female here for follow-up.  She was last seen in October.  History of GERD, esophageal dysphagia, diarrhea, hepatic fibrosis.  Liver biopsy July 2019 with stage II of 4 bridging fibrosis, marked secondary iron overload likely due to frequent iron infusions during dialysis. Abdominal ultrasound September 2020 showed subtle surface nodularity, spleen upper limits of normal in size, consider cirrhosis.  She had updated ultrasound December 2021 showing stable coarsened liver echotexture consistent with known cirrhosis.  Labs from November 2021 showed stable hemoglobin at 10.5, hematocrit 32.7, MCV 97, platelets 208,000, elevated creatinine of 6.52 on the same end-stage renal disease on hemodialysis, normal LFTs, iron/TIBC/ferritin normal, celiac screen negative, immune to hepatitis B, hepatitis C antibody positive but HCVRNA was negative.  Recommended hepatitis A vaccination.   Current Outpatient Medications  Medication Sig Dispense Refill  . acetaminophen (TYLENOL) 500 MG tablet Take 1,000 mg by mouth every 6 (six) hours as needed for moderate pain or headache.    Derrill Memo ON 09/15/2020] ARIPiprazole (ABILIFY) 5 MG tablet Take 1 tablet (5 mg total) by mouth daily. 90 tablet 0  . azelastine (ASTELIN) 0.1 % nasal spray Place 1 spray into both nostrils 2 (two) times daily. Use in each nostril as directed (Patient taking differently: Place 1 spray into both nostrils daily as needed for rhinitis. ) 30 mL 12  . buPROPion HCl (WELLBUTRIN XL PO) Take by mouth. (Patient not taking: Reported on 06/06/2020)    . carvedilol (COREG) 12.5 MG tablet Take 12.5 mg by mouth 2 (two) times daily.    . cholecalciferol (VITAMIN D) 1000 units tablet Take 1,000 Units by mouth daily.     . COMBIGAN 0.2-0.5 % ophthalmic solution INSTILL ONE DROP IN  [facility] EYE TWICE DAILY (Patient taking differently: 1 drop once.) 10 mL 0  . Continuous Blood Gluc Sensor (FREESTYLE LIBRE 14 DAY SENSOR) MISC Inject 1 each into the skin every 14 (fourteen) days. Use as directed. 2 each 2  . cyclobenzaprine (FLEXERIL) 5 MG tablet TAKE ONE TABLET BY MOUTH THREE TIMES DAILY AS NEEDED FOR MUSCLE SPASM 60 tablet 3  . diclofenac sodium (VOLTAREN) 1 % GEL Apply 2 g topically 4 (four) times daily. (Patient taking differently: Apply 2 g topically 4 (four) times daily as needed (pain). ) 200 g 3  . gabapentin (NEURONTIN) 100 MG capsule Take 1 capsule (100 mg total) by mouth 3 (three) times daily. (Needs to be seen before next refill) 90 capsule 0  . glucose blood (PRODIGY NO CODING BLOOD GLUC) test strip USE TO CHECK BLOOD SUGAR TWICE DAILY. Dx E11.22 200 each 3  . ibuprofen (ADVIL) 200 MG tablet Take 600 mg by mouth every 6 (six) hours as needed for headache or moderate pain.    . Insulin Pen Needle (TRUEPLUS PEN NEEDLES) 31G X 5 MM MISC Use daily with insulin Dx E11.22 100 each 3  . LANTUS SOLOSTAR 100 UNIT/ML Solostar Pen INJECT 18-50 UNITS SUBCUTANEOUSLY AT BEDTIME. (Patient taking differently: Inject 20 Units into the skin at bedtime. ) 15 mL 0  . levofloxacin (LEVAQUIN) 750 MG tablet     . lidocaine-prilocaine (EMLA) cream Apply 1 application topically daily as needed (arm port). (Patient not taking: Reported on 05/30/2020)    . linagliptin (TRADJENTA) 5 MG TABS tablet Take 1 tablet (5 mg total)  by mouth daily. (Patient taking differently: Take 5 mg by mouth every evening. ) 90 tablet 3  . multivitamin (RENA-VIT) TABS tablet Take 1 tablet by mouth daily.    . pantoprazole (PROTONIX) 40 MG tablet TAKE 1 BY MOUTH DAILY 30 minutes before breakfast (Patient taking differently: Take 40 mg by mouth daily. ) 90 tablet 3  . Probiotic Product (CULTURELLE PROBIOTICS PO) Take 1 capsule by mouth daily.     Marland Kitchen PRODIGY TWIST TOP LANCETS 28G MISC USE TO CHECK BLOOD SUGAR UP TO FOUR TIMES  DAILY. 100 each 1  . [START ON 09/06/2020] sertraline (ZOLOFT) 100 MG tablet Take 1.5 tablets (150 mg total) by mouth daily. 135 tablet 0  . sevelamer carbonate (RENVELA) 800 MG tablet Take 800-2,400 mg by mouth See admin instructions. Take 2473m by mouth three times daily with means and 8072mwith snacks    . SYMBICORT 160-4.5 MCG/ACT inhaler     . VENTOLIN HFA 108 (90 Base) MCG/ACT inhaler      No current facility-administered medications for this visit.    Allergies as of 08/29/2020 - Review Complete 08/08/2020  Allergen Reaction Noted  . Sulfamethoxazole-trimethoprim Nausea And Vomiting 07/13/2015  . Bactrim [sulfamethoxazole-trimethoprim] Nausea And Vomiting 05/21/2013  . Prednisone Other (See Comments) 07/09/2013    ROS:  General: Negative for anorexia, weight loss, fever, chills, fatigue, weakness. ENT: Negative for hoarseness, difficulty swallowing , nasal congestion. CV: Negative for chest pain, angina, palpitations, dyspnea on exertion, peripheral edema.  Respiratory: Negative for dyspnea at rest, dyspnea on exertion, cough, sputum, wheezing.  GI: See history of present illness. GU:  Negative for dysuria, hematuria, urinary incontinence, urinary frequency, nocturnal urination.  Endo: Negative for unusual weight change.    Physical Examination:   LMP 05/05/2015   General: Well-nourished, well-developed in no acute distress.  Eyes: No icterus. Mouth: Oropharyngeal mucosa moist and pink , no lesions erythema or exudate. Lungs: Clear to auscultation bilaterally.  Heart: Regular rate and rhythm, no murmurs rubs or gallops.  Abdomen: Bowel sounds are normal, nontender, nondistended, no hepatosplenomegaly or masses, no abdominal bruits or hernia , no rebound or guarding.   Extremities: No lower extremity edema. No clubbing or deformities. Neuro: Alert and oriented x 4   Skin: Warm and dry, no jaundice.   Psych: Alert and cooperative, normal mood and affect.  Labs:  ***   Imaging Studies: Intravitreal Injection, Pharmacologic Agent - OD - Right Eye  Result Date: 08/08/2020 Time Out 08/08/2020. 11:14 AM. Confirmed correct patient, procedure, site, and patient consented. Anesthesia Anesthetic medications included Akten 3.5%, Proparacaine 0.5%. Procedure Preparation included Tobramycin 0.3%, 10% betadine to eyelids. A supplied needle was used. Injection: 2.5 mg Bevacizumab (AVASTIN) 2.44m844m.1mL SOSY   NDC: [id number]: 2: [id number]: Intravitreal, Site: Right Eye Post-op Post injection exam found visual acuity of at least counting fingers. The patient tolerated the procedure well. There were no complications. The patient received written and verbal post procedure care education. Post injection medications were not given.   OCT, Retina - OU - Both Eyes  Result Date: 08/08/2020 Right Eye Quality was good. Scan locations included subfoveal. Central Foveal Thickness: 217. Progression has been stable. Findings include abnormal foveal contour. Left Eye Quality was borderline. Scan locations included temporal. Progression has been stable. Findings include abnormal foveal contour. Notes History of CSME OD, stabilized and improved on recurrent intravitreal Avastin also to prevent recurrent microscopic vitreous hemorrhages in this monocular patient

## 2020-08-30 DIAGNOSIS — N186 End stage renal disease: Secondary | ICD-10-CM | POA: Diagnosis not present

## 2020-08-30 DIAGNOSIS — N250 Renal osteodystrophy: Secondary | ICD-10-CM | POA: Diagnosis not present

## 2020-08-30 DIAGNOSIS — D631 Anemia in chronic kidney disease: Secondary | ICD-10-CM | POA: Diagnosis not present

## 2020-08-30 DIAGNOSIS — Z992 Dependence on renal dialysis: Secondary | ICD-10-CM | POA: Diagnosis not present

## 2020-08-31 ENCOUNTER — Ambulatory Visit (INDEPENDENT_AMBULATORY_CARE_PROVIDER_SITE_OTHER): Payer: Medicare Other | Admitting: Clinical

## 2020-08-31 ENCOUNTER — Other Ambulatory Visit: Payer: Self-pay

## 2020-08-31 DIAGNOSIS — F3341 Major depressive disorder, recurrent, in partial remission: Secondary | ICD-10-CM | POA: Diagnosis not present

## 2020-08-31 NOTE — Progress Notes (Signed)
  Virtual Visit via Telephone Note  I connected withViolet A Berrieron2/02/22at 10:00 AM EDTby telephoneand verified that I am speaking with the correct person using two identifiers.  Location: Patient:Home Provider:Office  I discussed the limitations of evaluation and management by telemedicine and the availability of in person appointments. The patient expressed understanding and agreed to proceed.    THERAPIST PROGRESS NOTE  Session Time:10:00AM-10:25AM  Participation Level:Active  Behavioral Response:CasualAlertDepressed  Type of Therapy:Individual Therapy  Treatment Goals addressed:Coping  Interventions:CBT  Summary:Kara A Berrieris a 50 y.o.femalewho presents with Depression.The OPT therapist worked with thepatientfor herongoing OPT treatmentsession. The OPT therapist utilized Motivational Interviewing to assist in creating therapeutic repore. The patient in the session was engaged and work in Science writer about hertriggers and symptoms over the past few weeksincludingthe death of a extended family member and her Mother learning she will be in Hospice. The OPT therapist utilized Cognitive Behavioral Therapy through cognitive restructuring as well as worked with the patient on coping strategies to assist in management ofmood and the patient verbalized her health has been improving.The patient reportedthe shecontinues to work on implementing positive thinking, self esteem, and staying activeas well as being aware of and consistent in keeping her health care appointments.  Suicidal/Homicidal:Nowithout intent/plan  Therapist Response:The OPT therapist worked with the patient for the patients scheduled session. The patient was engaged in his session and gave feedback in relation to triggers, symptoms, and behavior responses over the pastfewweeks. The OPT therapist worked with the patient utilizing an in session  Cognitive Behavioral Therapy exercise. The patient was responsive in the session and verbalized, "Kara Knapp a lot to be thankful for the time I have with family even though I hate its due to a funeral".The OPT therapist worked with the patient on implementing positive thinking and reviewing how this impacts mood.The OPT therapist will continue treatment work with the patient in hernext scheduled session  Plan: Return again in3weeks.  Diagnosis:Axis I:MDD (major depressive disorder), recurrent, in partial remission   Axis II:No diagnosis  I discussed the assessment and treatment plan with the patient. The patient was provided an opportunity to ask questions and all were answered. The patient agreed with the plan and demonstrated an understanding of the instructions.  The patient was advised to call back or seek an in-person evaluation if the symptoms worsen or if the condition fails to improve as anticipated.  I provided52mnutes of non-face-to-face time during this encounter.  TLennox Grumbles LCSW  08/31/2020

## 2020-09-01 DIAGNOSIS — N186 End stage renal disease: Secondary | ICD-10-CM | POA: Diagnosis not present

## 2020-09-01 DIAGNOSIS — D631 Anemia in chronic kidney disease: Secondary | ICD-10-CM | POA: Diagnosis not present

## 2020-09-01 DIAGNOSIS — Z992 Dependence on renal dialysis: Secondary | ICD-10-CM | POA: Diagnosis not present

## 2020-09-01 DIAGNOSIS — N250 Renal osteodystrophy: Secondary | ICD-10-CM | POA: Diagnosis not present

## 2020-09-03 DIAGNOSIS — D631 Anemia in chronic kidney disease: Secondary | ICD-10-CM | POA: Diagnosis not present

## 2020-09-03 DIAGNOSIS — N250 Renal osteodystrophy: Secondary | ICD-10-CM | POA: Diagnosis not present

## 2020-09-03 DIAGNOSIS — Z992 Dependence on renal dialysis: Secondary | ICD-10-CM | POA: Diagnosis not present

## 2020-09-03 DIAGNOSIS — N186 End stage renal disease: Secondary | ICD-10-CM | POA: Diagnosis not present

## 2020-09-07 DIAGNOSIS — D631 Anemia in chronic kidney disease: Secondary | ICD-10-CM | POA: Diagnosis not present

## 2020-09-07 DIAGNOSIS — N250 Renal osteodystrophy: Secondary | ICD-10-CM | POA: Diagnosis not present

## 2020-09-07 DIAGNOSIS — Z992 Dependence on renal dialysis: Secondary | ICD-10-CM | POA: Diagnosis not present

## 2020-09-07 DIAGNOSIS — N186 End stage renal disease: Secondary | ICD-10-CM | POA: Diagnosis not present

## 2020-09-08 DIAGNOSIS — N250 Renal osteodystrophy: Secondary | ICD-10-CM | POA: Diagnosis not present

## 2020-09-08 DIAGNOSIS — Z992 Dependence on renal dialysis: Secondary | ICD-10-CM | POA: Diagnosis not present

## 2020-09-08 DIAGNOSIS — D631 Anemia in chronic kidney disease: Secondary | ICD-10-CM | POA: Diagnosis not present

## 2020-09-08 DIAGNOSIS — N186 End stage renal disease: Secondary | ICD-10-CM | POA: Diagnosis not present

## 2020-09-10 DIAGNOSIS — D631 Anemia in chronic kidney disease: Secondary | ICD-10-CM | POA: Diagnosis not present

## 2020-09-10 DIAGNOSIS — Z992 Dependence on renal dialysis: Secondary | ICD-10-CM | POA: Diagnosis not present

## 2020-09-10 DIAGNOSIS — N186 End stage renal disease: Secondary | ICD-10-CM | POA: Diagnosis not present

## 2020-09-10 DIAGNOSIS — N250 Renal osteodystrophy: Secondary | ICD-10-CM | POA: Diagnosis not present

## 2020-09-12 DIAGNOSIS — Z299 Encounter for prophylactic measures, unspecified: Secondary | ICD-10-CM | POA: Diagnosis not present

## 2020-09-12 DIAGNOSIS — R309 Painful micturition, unspecified: Secondary | ICD-10-CM | POA: Diagnosis not present

## 2020-09-12 DIAGNOSIS — E1165 Type 2 diabetes mellitus with hyperglycemia: Secondary | ICD-10-CM | POA: Diagnosis not present

## 2020-09-12 DIAGNOSIS — Z6833 Body mass index (BMI) 33.0-33.9, adult: Secondary | ICD-10-CM | POA: Diagnosis not present

## 2020-09-12 DIAGNOSIS — Z992 Dependence on renal dialysis: Secondary | ICD-10-CM | POA: Diagnosis not present

## 2020-09-12 DIAGNOSIS — N186 End stage renal disease: Secondary | ICD-10-CM | POA: Diagnosis not present

## 2020-09-12 DIAGNOSIS — F17210 Nicotine dependence, cigarettes, uncomplicated: Secondary | ICD-10-CM | POA: Diagnosis not present

## 2020-09-13 DIAGNOSIS — D631 Anemia in chronic kidney disease: Secondary | ICD-10-CM | POA: Diagnosis not present

## 2020-09-13 DIAGNOSIS — N186 End stage renal disease: Secondary | ICD-10-CM | POA: Diagnosis not present

## 2020-09-13 DIAGNOSIS — N250 Renal osteodystrophy: Secondary | ICD-10-CM | POA: Diagnosis not present

## 2020-09-13 DIAGNOSIS — Z992 Dependence on renal dialysis: Secondary | ICD-10-CM | POA: Diagnosis not present

## 2020-09-15 DIAGNOSIS — D631 Anemia in chronic kidney disease: Secondary | ICD-10-CM | POA: Diagnosis not present

## 2020-09-15 DIAGNOSIS — N186 End stage renal disease: Secondary | ICD-10-CM | POA: Diagnosis not present

## 2020-09-15 DIAGNOSIS — N250 Renal osteodystrophy: Secondary | ICD-10-CM | POA: Diagnosis not present

## 2020-09-15 DIAGNOSIS — Z992 Dependence on renal dialysis: Secondary | ICD-10-CM | POA: Diagnosis not present

## 2020-09-17 DIAGNOSIS — Z992 Dependence on renal dialysis: Secondary | ICD-10-CM | POA: Diagnosis not present

## 2020-09-17 DIAGNOSIS — N186 End stage renal disease: Secondary | ICD-10-CM | POA: Diagnosis not present

## 2020-09-17 DIAGNOSIS — D631 Anemia in chronic kidney disease: Secondary | ICD-10-CM | POA: Diagnosis not present

## 2020-09-17 DIAGNOSIS — N250 Renal osteodystrophy: Secondary | ICD-10-CM | POA: Diagnosis not present

## 2020-09-19 ENCOUNTER — Other Ambulatory Visit: Payer: Self-pay | Admitting: Family Medicine

## 2020-09-19 DIAGNOSIS — M549 Dorsalgia, unspecified: Secondary | ICD-10-CM

## 2020-09-19 DIAGNOSIS — M62838 Other muscle spasm: Secondary | ICD-10-CM

## 2020-09-20 DIAGNOSIS — Z992 Dependence on renal dialysis: Secondary | ICD-10-CM | POA: Diagnosis not present

## 2020-09-20 DIAGNOSIS — D631 Anemia in chronic kidney disease: Secondary | ICD-10-CM | POA: Diagnosis not present

## 2020-09-20 DIAGNOSIS — N186 End stage renal disease: Secondary | ICD-10-CM | POA: Diagnosis not present

## 2020-09-20 DIAGNOSIS — N250 Renal osteodystrophy: Secondary | ICD-10-CM | POA: Diagnosis not present

## 2020-09-22 DIAGNOSIS — D631 Anemia in chronic kidney disease: Secondary | ICD-10-CM | POA: Diagnosis not present

## 2020-09-22 DIAGNOSIS — N250 Renal osteodystrophy: Secondary | ICD-10-CM | POA: Diagnosis not present

## 2020-09-22 DIAGNOSIS — Z992 Dependence on renal dialysis: Secondary | ICD-10-CM | POA: Diagnosis not present

## 2020-09-22 DIAGNOSIS — N186 End stage renal disease: Secondary | ICD-10-CM | POA: Diagnosis not present

## 2020-09-24 DIAGNOSIS — N250 Renal osteodystrophy: Secondary | ICD-10-CM | POA: Diagnosis not present

## 2020-09-24 DIAGNOSIS — Z992 Dependence on renal dialysis: Secondary | ICD-10-CM | POA: Diagnosis not present

## 2020-09-24 DIAGNOSIS — N186 End stage renal disease: Secondary | ICD-10-CM | POA: Diagnosis not present

## 2020-09-24 DIAGNOSIS — D631 Anemia in chronic kidney disease: Secondary | ICD-10-CM | POA: Diagnosis not present

## 2020-09-26 ENCOUNTER — Other Ambulatory Visit: Payer: Self-pay | Admitting: Family Medicine

## 2020-09-26 DIAGNOSIS — N186 End stage renal disease: Secondary | ICD-10-CM | POA: Diagnosis not present

## 2020-09-26 DIAGNOSIS — M549 Dorsalgia, unspecified: Secondary | ICD-10-CM

## 2020-09-26 DIAGNOSIS — Z992 Dependence on renal dialysis: Secondary | ICD-10-CM | POA: Diagnosis not present

## 2020-09-26 DIAGNOSIS — M62838 Other muscle spasm: Secondary | ICD-10-CM

## 2020-09-27 DIAGNOSIS — D631 Anemia in chronic kidney disease: Secondary | ICD-10-CM | POA: Diagnosis not present

## 2020-09-27 DIAGNOSIS — Z992 Dependence on renal dialysis: Secondary | ICD-10-CM | POA: Diagnosis not present

## 2020-09-27 DIAGNOSIS — D509 Iron deficiency anemia, unspecified: Secondary | ICD-10-CM | POA: Diagnosis not present

## 2020-09-27 DIAGNOSIS — N186 End stage renal disease: Secondary | ICD-10-CM | POA: Diagnosis not present

## 2020-09-27 DIAGNOSIS — N250 Renal osteodystrophy: Secondary | ICD-10-CM | POA: Diagnosis not present

## 2020-10-01 DIAGNOSIS — N250 Renal osteodystrophy: Secondary | ICD-10-CM | POA: Diagnosis not present

## 2020-10-01 DIAGNOSIS — D509 Iron deficiency anemia, unspecified: Secondary | ICD-10-CM | POA: Diagnosis not present

## 2020-10-01 DIAGNOSIS — Z992 Dependence on renal dialysis: Secondary | ICD-10-CM | POA: Diagnosis not present

## 2020-10-01 DIAGNOSIS — N186 End stage renal disease: Secondary | ICD-10-CM | POA: Diagnosis not present

## 2020-10-01 DIAGNOSIS — D631 Anemia in chronic kidney disease: Secondary | ICD-10-CM | POA: Diagnosis not present

## 2020-10-04 DIAGNOSIS — N250 Renal osteodystrophy: Secondary | ICD-10-CM | POA: Diagnosis not present

## 2020-10-04 DIAGNOSIS — D631 Anemia in chronic kidney disease: Secondary | ICD-10-CM | POA: Diagnosis not present

## 2020-10-04 DIAGNOSIS — N186 End stage renal disease: Secondary | ICD-10-CM | POA: Diagnosis not present

## 2020-10-04 DIAGNOSIS — D509 Iron deficiency anemia, unspecified: Secondary | ICD-10-CM | POA: Diagnosis not present

## 2020-10-04 DIAGNOSIS — Z992 Dependence on renal dialysis: Secondary | ICD-10-CM | POA: Diagnosis not present

## 2020-10-05 ENCOUNTER — Inpatient Hospital Stay (HOSPITAL_COMMUNITY)
Admission: EM | Admit: 2020-10-05 | Discharge: 2020-10-07 | DRG: 190 | Disposition: A | Discharge status: Home / Self Care | Payer: Medicare Other | Attending: Family Medicine | Admitting: Family Medicine

## 2020-10-05 ENCOUNTER — Other Ambulatory Visit: Payer: Self-pay

## 2020-10-05 ENCOUNTER — Encounter (HOSPITAL_COMMUNITY): Payer: Self-pay

## 2020-10-05 ENCOUNTER — Emergency Department (HOSPITAL_COMMUNITY): Payer: Medicare Other

## 2020-10-05 DIAGNOSIS — Z794 Long term (current) use of insulin: Secondary | ICD-10-CM

## 2020-10-05 DIAGNOSIS — H548 Legal blindness, as defined in USA: Secondary | ICD-10-CM | POA: Diagnosis present

## 2020-10-05 DIAGNOSIS — I132 Hypertensive heart and chronic kidney disease with heart failure and with stage 5 chronic kidney disease, or end stage renal disease: Secondary | ICD-10-CM | POA: Diagnosis present

## 2020-10-05 DIAGNOSIS — H409 Unspecified glaucoma: Secondary | ICD-10-CM | POA: Diagnosis present

## 2020-10-05 DIAGNOSIS — Z7951 Long term (current) use of inhaled steroids: Secondary | ICD-10-CM

## 2020-10-05 DIAGNOSIS — Z7984 Long term (current) use of oral hypoglycemic drugs: Secondary | ICD-10-CM

## 2020-10-05 DIAGNOSIS — Z833 Family history of diabetes mellitus: Secondary | ICD-10-CM

## 2020-10-05 DIAGNOSIS — F419 Anxiety disorder, unspecified: Secondary | ICD-10-CM | POA: Diagnosis present

## 2020-10-05 DIAGNOSIS — Z83438 Family history of other disorder of lipoprotein metabolism and other lipidemia: Secondary | ICD-10-CM

## 2020-10-05 DIAGNOSIS — Z22322 Carrier or suspected carrier of Methicillin resistant Staphylococcus aureus: Secondary | ICD-10-CM

## 2020-10-05 DIAGNOSIS — Z9049 Acquired absence of other specified parts of digestive tract: Secondary | ICD-10-CM

## 2020-10-05 DIAGNOSIS — J441 Chronic obstructive pulmonary disease with (acute) exacerbation: Principal | ICD-10-CM | POA: Diagnosis present

## 2020-10-05 DIAGNOSIS — E113511 Type 2 diabetes mellitus with proliferative diabetic retinopathy with macular edema, right eye: Secondary | ICD-10-CM | POA: Diagnosis present

## 2020-10-05 DIAGNOSIS — Z883 Allergy status to other anti-infective agents status: Secondary | ICD-10-CM

## 2020-10-05 DIAGNOSIS — K7460 Unspecified cirrhosis of liver: Secondary | ICD-10-CM | POA: Diagnosis present

## 2020-10-05 DIAGNOSIS — K760 Fatty (change of) liver, not elsewhere classified: Secondary | ICD-10-CM | POA: Diagnosis present

## 2020-10-05 DIAGNOSIS — J9601 Acute respiratory failure with hypoxia: Secondary | ICD-10-CM | POA: Diagnosis not present

## 2020-10-05 DIAGNOSIS — F17210 Nicotine dependence, cigarettes, uncomplicated: Secondary | ICD-10-CM | POA: Diagnosis present

## 2020-10-05 DIAGNOSIS — I10 Essential (primary) hypertension: Secondary | ICD-10-CM | POA: Diagnosis present

## 2020-10-05 DIAGNOSIS — F3341 Major depressive disorder, recurrent, in partial remission: Secondary | ICD-10-CM | POA: Diagnosis present

## 2020-10-05 DIAGNOSIS — D631 Anemia in chronic kidney disease: Secondary | ICD-10-CM | POA: Diagnosis present

## 2020-10-05 DIAGNOSIS — Z8249 Family history of ischemic heart disease and other diseases of the circulatory system: Secondary | ICD-10-CM

## 2020-10-05 DIAGNOSIS — Z79899 Other long term (current) drug therapy: Secondary | ICD-10-CM

## 2020-10-05 DIAGNOSIS — N186 End stage renal disease: Secondary | ICD-10-CM | POA: Diagnosis present

## 2020-10-05 DIAGNOSIS — F331 Major depressive disorder, recurrent, moderate: Secondary | ICD-10-CM | POA: Diagnosis present

## 2020-10-05 DIAGNOSIS — Z992 Dependence on renal dialysis: Secondary | ICD-10-CM

## 2020-10-05 DIAGNOSIS — E1140 Type 2 diabetes mellitus with diabetic neuropathy, unspecified: Secondary | ICD-10-CM | POA: Diagnosis present

## 2020-10-05 DIAGNOSIS — K7400 Hepatic fibrosis, unspecified: Secondary | ICD-10-CM | POA: Diagnosis present

## 2020-10-05 DIAGNOSIS — E782 Mixed hyperlipidemia: Secondary | ICD-10-CM | POA: Diagnosis present

## 2020-10-05 DIAGNOSIS — Z716 Tobacco abuse counseling: Secondary | ICD-10-CM

## 2020-10-05 DIAGNOSIS — Z825 Family history of asthma and other chronic lower respiratory diseases: Secondary | ICD-10-CM

## 2020-10-05 DIAGNOSIS — R0602 Shortness of breath: Secondary | ICD-10-CM | POA: Diagnosis not present

## 2020-10-05 DIAGNOSIS — K219 Gastro-esophageal reflux disease without esophagitis: Secondary | ICD-10-CM | POA: Diagnosis present

## 2020-10-05 DIAGNOSIS — Z882 Allergy status to sulfonamides status: Secondary | ICD-10-CM

## 2020-10-05 DIAGNOSIS — E1122 Type 2 diabetes mellitus with diabetic chronic kidney disease: Secondary | ICD-10-CM | POA: Diagnosis present

## 2020-10-05 DIAGNOSIS — Z888 Allergy status to other drugs, medicaments and biological substances status: Secondary | ICD-10-CM

## 2020-10-05 DIAGNOSIS — E8889 Other specified metabolic disorders: Secondary | ICD-10-CM | POA: Diagnosis present

## 2020-10-05 DIAGNOSIS — I5032 Chronic diastolic (congestive) heart failure: Secondary | ICD-10-CM | POA: Diagnosis present

## 2020-10-05 DIAGNOSIS — Z20822 Contact with and (suspected) exposure to covid-19: Secondary | ICD-10-CM | POA: Diagnosis present

## 2020-10-05 LAB — COMPREHENSIVE METABOLIC PANEL
ALT: 25 U/L (ref 0–44)
AST: 23 U/L (ref 15–41)
Albumin: 3.6 g/dL (ref 3.5–5.0)
Alkaline Phosphatase: 84 U/L (ref 38–126)
Anion gap: 12 (ref 5–15)
BUN: 30 mg/dL — ABNORMAL HIGH (ref 6–20)
CO2: 26 mmol/L (ref 22–32)
Calcium: 8.8 mg/dL — ABNORMAL LOW (ref 8.9–10.3)
Chloride: 98 mmol/L (ref 98–111)
Creatinine, Ser: 7.32 mg/dL — ABNORMAL HIGH (ref 0.44–1.00)
GFR, Estimated: 6 mL/min — ABNORMAL LOW (ref >60)
Glucose, Bld: 115 mg/dL — ABNORMAL HIGH (ref 70–99)
Potassium: 4.1 mmol/L (ref 3.5–5.1)
Sodium: 136 mmol/L (ref 135–145)
Total Bilirubin: 0.6 mg/dL (ref 0.3–1.2)
Total Protein: 7 g/dL (ref 6.5–8.1)

## 2020-10-05 LAB — GLUCOSE, CAPILLARY: Glucose-Capillary: 206 mg/dL — ABNORMAL HIGH (ref 70–99)

## 2020-10-05 LAB — CBC WITH DIFFERENTIAL/PLATELET
Abs Immature Granulocytes: 0.01 10*3/uL (ref 0.00–0.07)
Basophils Absolute: 0 10*3/uL (ref 0.0–0.1)
Basophils Relative: 1 %
Eosinophils Absolute: 0 10*3/uL (ref 0.0–0.5)
Eosinophils Relative: 1 %
HCT: 26.8 % — ABNORMAL LOW (ref 36.0–46.0)
Hemoglobin: 8.2 g/dL — ABNORMAL LOW (ref 12.0–15.0)
Immature Granulocytes: 0 %
Lymphocytes Relative: 23 %
Lymphs Abs: 0.7 10*3/uL (ref 0.7–4.0)
MCH: 29.2 pg (ref 26.0–34.0)
MCHC: 30.6 g/dL (ref 30.0–36.0)
MCV: 95.4 fL (ref 80.0–100.0)
Monocytes Absolute: 0.6 10*3/uL (ref 0.1–1.0)
Monocytes Relative: 18 %
Neutro Abs: 1.8 10*3/uL (ref 1.7–7.7)
Neutrophils Relative %: 57 %
Platelets: 139 10*3/uL — ABNORMAL LOW (ref 150–400)
RBC: 2.81 MIL/uL — ABNORMAL LOW (ref 3.87–5.11)
RDW: 15 % (ref 11.5–15.5)
WBC: 3.2 10*3/uL — ABNORMAL LOW (ref 4.0–10.5)
nRBC: 0 % (ref 0.0–0.2)

## 2020-10-05 LAB — RESP PANEL BY RT-PCR (FLU A&B, COVID) ARPGX2
Influenza A by PCR: NEGATIVE
Influenza B by PCR: NEGATIVE
SARS Coronavirus 2 by RT PCR: NEGATIVE

## 2020-10-05 MED ORDER — ARIPIPRAZOLE 5 MG PO TABS
5.0000 mg | ORAL_TABLET | Freq: Every day | ORAL | Status: DC
  Administered 2020-10-05 – 2020-10-07 (×3): 5 mg via ORAL
  Filled 2020-10-05 (×3): qty 1

## 2020-10-05 MED ORDER — ALPRAZOLAM 0.5 MG PO TABS
0.5000 mg | ORAL_TABLET | Freq: Every evening | ORAL | Status: DC | PRN
  Administered 2020-10-05: 0.5 mg via ORAL
  Filled 2020-10-05: qty 1

## 2020-10-05 MED ORDER — CARVEDILOL 12.5 MG PO TABS
12.5000 mg | ORAL_TABLET | Freq: Two times a day (BID) | ORAL | Status: DC
  Administered 2020-10-05 – 2020-10-07 (×4): 12.5 mg via ORAL
  Filled 2020-10-05 (×4): qty 1

## 2020-10-05 MED ORDER — IPRATROPIUM-ALBUTEROL 0.5-2.5 (3) MG/3ML IN SOLN
3.0000 mL | Freq: Four times a day (QID) | RESPIRATORY_TRACT | Status: AC
  Administered 2020-10-05 – 2020-10-06 (×3): 3 mL via RESPIRATORY_TRACT
  Filled 2020-10-05 (×5): qty 3

## 2020-10-05 MED ORDER — DOXYCYCLINE HYCLATE 100 MG PO TABS
100.0000 mg | ORAL_TABLET | Freq: Two times a day (BID) | ORAL | Status: DC
  Administered 2020-10-05 – 2020-10-07 (×4): 100 mg via ORAL
  Filled 2020-10-05 (×3): qty 1

## 2020-10-05 MED ORDER — INSULIN GLARGINE 100 UNIT/ML ~~LOC~~ SOLN
20.0000 [IU] | Freq: Every day | SUBCUTANEOUS | Status: DC
  Administered 2020-10-05 – 2020-10-06 (×2): 20 [IU] via SUBCUTANEOUS
  Filled 2020-10-05 (×5): qty 0.2

## 2020-10-05 MED ORDER — ACETAMINOPHEN 325 MG PO TABS: 650.0000 mg | ORAL_TABLET | Freq: Four times a day (QID) | ORAL | Status: DC | PRN

## 2020-10-05 MED ORDER — INSULIN ASPART 100 UNIT/ML ~~LOC~~ SOLN
0.0000 [IU] | Freq: Three times a day (TID) | SUBCUTANEOUS | Status: DC
  Administered 2020-10-06: 2 [IU] via SUBCUTANEOUS
  Administered 2020-10-06: 3 [IU] via SUBCUTANEOUS
  Administered 2020-10-06 – 2020-10-07 (×2): 5 [IU] via SUBCUTANEOUS
  Administered 2020-10-07: 3 [IU] via SUBCUTANEOUS

## 2020-10-05 MED ORDER — ACETAMINOPHEN 650 MG RE SUPP: 650.0000 mg | Freq: Four times a day (QID) | RECTAL | Status: DC | PRN

## 2020-10-05 MED ORDER — METHYLPREDNISOLONE SODIUM SUCC 125 MG IJ SOLR
125.0000 mg | Freq: Once | INTRAMUSCULAR | Status: AC
  Administered 2020-10-05: 125 mg via INTRAVENOUS
  Filled 2020-10-05: qty 2

## 2020-10-05 MED ORDER — HEPARIN SODIUM (PORCINE) 5000 UNIT/ML IJ SOLN
5000.0000 [IU] | Freq: Three times a day (TID) | INTRAMUSCULAR | Status: DC
  Administered 2020-10-05 – 2020-10-07 (×5): 5000 [IU] via SUBCUTANEOUS
  Filled 2020-10-05 (×5): qty 1

## 2020-10-05 MED ORDER — SERTRALINE HCL 50 MG PO TABS
150.0000 mg | ORAL_TABLET | Freq: Every day | ORAL | Status: DC
  Administered 2020-10-05 – 2020-10-07 (×3): 150 mg via ORAL
  Filled 2020-10-05 (×3): qty 3

## 2020-10-05 MED ORDER — GUAIFENESIN-DM 100-10 MG/5ML PO SYRP
10.0000 mL | ORAL_SOLUTION | Freq: Three times a day (TID) | ORAL | Status: AC
  Administered 2020-10-05 – 2020-10-06 (×3): 10 mL via ORAL
  Filled 2020-10-05 (×4): qty 10

## 2020-10-05 MED ORDER — POLYETHYLENE GLYCOL 3350 17 G PO PACK: 17.0000 g | PACK | Freq: Every day | ORAL | Status: DC | PRN

## 2020-10-05 MED ORDER — IPRATROPIUM-ALBUTEROL 0.5-2.5 (3) MG/3ML IN SOLN: 3.0000 mL | RESPIRATORY_TRACT | Status: DC | PRN

## 2020-10-05 MED ORDER — GABAPENTIN 100 MG PO CAPS
100.0000 mg | ORAL_CAPSULE | Freq: Three times a day (TID) | ORAL | Status: DC
  Administered 2020-10-05 – 2020-10-07 (×5): 100 mg via ORAL
  Filled 2020-10-05 (×5): qty 1

## 2020-10-05 MED ORDER — INSULIN ASPART 100 UNIT/ML ~~LOC~~ SOLN
0.0000 [IU] | Freq: Every day | SUBCUTANEOUS | Status: DC
  Administered 2020-10-05: 2 [IU] via SUBCUTANEOUS

## 2020-10-05 MED ORDER — PREDNISONE 20 MG PO TABS: 40.0000 mg | ORAL_TABLET | Freq: Every day | ORAL | Status: DC

## 2020-10-05 MED ORDER — METHYLPREDNISOLONE SODIUM SUCC 125 MG IJ SOLR
60.0000 mg | Freq: Four times a day (QID) | INTRAMUSCULAR | Status: DC
  Filled 2020-10-05: qty 2

## 2020-10-05 MED ORDER — ALBUTEROL SULFATE (2.5 MG/3ML) 0.083% IN NEBU
5.0000 mg | INHALATION_SOLUTION | Freq: Once | RESPIRATORY_TRACT | Status: AC
  Administered 2020-10-05: 5 mg via RESPIRATORY_TRACT
  Filled 2020-10-05: qty 6

## 2020-10-05 NOTE — ED Notes (Signed)
Pt. Removed Nasal Cannula from face and is sitting on the edge of the bed requesting food. Pt. Advised they need to keep the nasal cannula on.

## 2020-10-05 NOTE — ED Notes (Signed)
Ambulated pt. 50 ft. Pts. O2 dropped to 88 RA. When pt. Sat down in bed pts. O2 went to 86 on RA. Placed 2 L of O2 back on pt. Via nasal cannula. Pts. O2 sats are now 94.

## 2020-10-05 NOTE — H&P (Addendum)
History and Physical    Kara Knapp XKG:[id number] DOB: January 17, 1971 DOA: 10/05/2020  PCP: Medicine, [facility]   Patient coming from:Home  I have personally briefly reviewed patient's old medical records in [place]  Chief Complaint: Difficulty breathing  HPI: Kara Knapp is a 50 y.o. female with medical history significant for COPD, ESRD, diastolic CHF, hypertension, liver cirrhosis, diabetes with neuropathy, legally blind. Patient presented to the ED with complaints of difficulty breathing of 2 days duration.  She reports dry cough also continuous wheezing.  No chest pain.  No leg swelling.  No recent trips.  She is not on home O2.  ED Course: Temp 98.1, O2 sats on room air 95% but drops to 86 patients with ambulation.  Respiratory rate 16-27.  COVID test negative.  Chest x-ray without active disease.  125 Solu-Medrol given, albuterol nebulizer given .  Hospitalist to admit for COPD with acute respiratory failure.  Review of Systems: As per HPI all other systems reviewed and negative.  Past Medical History:  Diagnosis Date  . Anemia of chronic disease   . Anxiety   . Asthma   . Blind left eye   . Bronchitis   . Cataract   . Cholecystitis, acute 05/26/2013   Status post cholecystectomy  . Chronic abdominal pain   . Chronic diarrhea   . COPD (chronic obstructive pulmonary disease) ([place])   . Depression   . Diabetic foot ulcer ([place]) 03/01/2015  . Diastolic heart failure ([place])   . ESRD on hemodialysis ([place])    Started diaylsis 12/29/15  . Essential hypertension   . Fibroids   . Glaucoma   . History of blood transfusion   . History of pneumonia   . Hyperlipidemia   . Insulin-dependent diabetes mellitus with retinopathy   . Liver fibrosis    Negative Hep B surface antigen, negative Hep C antibody Feb 2018 (see scanned in labs).  . Neuropathy   . Osteomyelitis ([place])    Toe on left foot    Past Surgical History:  Procedure Laterality Date  . A/V  SHUNTOGRAM N/A 10/25/2016   Procedure: A/V Shuntogram - Right Arm;  Surgeon: Waynetta Sandy, MD;  Location: [facility];  Service: Cardiovascular;  Laterality: N/A;  . A/V SHUNTOGRAM N/A 03/05/2018   Procedure: A/V SHUNTOGRAM - Right Arm;  Surgeon: Waynetta Sandy, MD;  Location: [facility];  Service: Cardiovascular;  Laterality: N/A;  . AV FISTULA PLACEMENT Right 10/17/2015   Procedure: INSERTION OF ARTERIOVENOUS GORE-TEX GRAFT RIGHT UPPER ARM WITH ACUSEAL;  Surgeon: Conrad Rozel, MD;  Location: [facility];  Service: Vascular;  Laterality: Right;  . AV FISTULA PLACEMENT Left 11/18/2019   Procedure: INSERTION OF ARTERIOVENOUS (AV) GORE-TEX GRAFT left  ARM;  Surgeon: Serafina Mitchell, MD;  Location: [facility];  Service: Vascular;  Laterality: Left;  . CATARACT EXTRACTION W/ INTRAOCULAR LENS IMPLANT Bilateral   . CESAREAN SECTION    . CHOLECYSTECTOMY N/A 05/25/2013   Procedure: LAPAROSCOPIC CHOLECYSTECTOMY;  Surgeon: Jamesetta So, MD;  Location: [facility];  Service: General;  Laterality: N/A;  . COLONOSCOPY WITH PROPOFOL N/A 10/02/2016   two 3 to 5 mm polyps in the descending colon, three 2 to 3 mm polyps in the rectum, random colon biopsies, rectal bleeding due to internal hemorrhoids, friability with no bleeding at the anus status post biopsy.  Surgical pathology found the polyps to be a mix of hyperplastic and tubular adenoma, random colon biopsies to be  benign colonic mucosa, and the anal biopsies to be anal skin tag.   Marland Kitchen ESOPHAGOGASTRODUODENOSCOPY (EGD) WITH PROPOFOL N/A 10/02/2016   mucosal nodule in the esophagus status post biopsy, moderate gastritis status post biopsy, mild duodenitis. esophageal biopsy to be benign, gastric biopsies to be gastritis due to aspirin use, and duodenal biopsies to be duodenitis due to aspirin use  . EYE SURGERY Bilateral   . IR FLUORO GUIDE CV LINE RIGHT  10/09/2019  . IR REMOVAL TUN CV CATH W/O FL  01/15/2020  . IR THROMBECTOMY AV FISTULA  W/THROMBOLYSIS/PTA INC/SHUNT/IMG RIGHT Right 12/26/2018  . IR THROMBECTOMY AV FISTULA W/THROMBOLYSIS/PTA INC/SHUNT/IMG RIGHT Right 08/10/2019  . IR THROMBECTOMY AV FISTULA W/THROMBOLYSIS/PTA/STENT INC/SHUNT/IMG RT Right 08/08/2018  . IR THROMBECTOMY AV FISTULA W/THROMBOLYSIS/PTA/STENT INC/SHUNT/IMG RT Right 05/15/2019  . IR US GUIDE VASC ACCESS RIGHT  08/08/2018  . IR US GUIDE VASC ACCESS RIGHT  12/26/2018  . IR US GUIDE VASC ACCESS RIGHT  05/15/2019  . IR US GUIDE VASC ACCESS RIGHT  08/10/2019  . IR US GUIDE VASC ACCESS RIGHT  10/09/2019  . PARS PLANA VITRECTOMY Left 11/24/2014   Procedure: PARS PLANA VITRECTOMY WITH 25 GAUGE;  Surgeon: Hurman Horn, MD;  Location: [facility];  Service: Ophthalmology;  Laterality: Left;  . PERIPHERAL VASCULAR BALLOON ANGIOPLASTY Right 03/05/2018   Procedure: PERIPHERAL VASCULAR BALLOON ANGIOPLASTY;  Surgeon: Waynetta Sandy, MD;  Location: [facility];  Service: Cardiovascular;  Laterality: Right;  arm fistula  . PERIPHERAL VASCULAR CATHETERIZATION N/A 04/28/2015   Procedure: Bilateral Upper Extremity Venography;  Surgeon: Conrad Hilltop, MD;  Location: [facility];  Service: Cardiovascular;  Laterality: N/A;  . PHOTOCOAGULATION WITH LASER Left 11/24/2014   Procedure: PHOTOCOAGULATION WITH LASER;  Surgeon: Hurman Horn, MD;  Location: [facility];  Service: Ophthalmology;  Laterality: Left;  with insertion of silicone oil  . SAVORY DILATION N/A 10/02/2016   Procedure: SAVORY DILATION;  Surgeon: Danie Binder, MD;  [facility];  Service: Endoscopy;  Laterality: N/A;  . TUBAL LIGATION    . UPPER EXTREMITY VENOGRAPHY Bilateral 11/02/2019   Procedure: UPPER EXTREMITY VENOGRAPHY;  Surgeon: Waynetta Sandy, MD;  Location: [facility];  Service: Cardiovascular;  Laterality: Bilateral;     reports that she has been smoking cigarettes. She started smoking about 19 years ago. She has a 23.00 pack-year smoking history. She has never used  smokeless tobacco. She reports that she does not drink alcohol and does not use drugs.  Allergies  Allergen Reactions  . Sulfamethoxazole-Trimethoprim Nausea And Vomiting  . Bactrim [Sulfamethoxazole-Trimethoprim] Nausea And Vomiting  . Prednisone Other (See Comments)    "I was wide open and couldn't eat" per pt. Loss of appetite and insomnia  LOSS OF APPETITE,UNABLE TO SLEEP    Family History  Problem Relation Age of Onset  . COPD Mother   . Cancer Father   . Lymphoma Father   . Diabetes Sister   . Deep vein thrombosis Sister   . Diabetes Brother   . Hyperlipidemia Brother   . Hypertension Brother   . Mental retardation Sister   . Alcohol abuse Paternal Grandmother   . Colon cancer Neg Hx   . Liver disease Neg Hx     Prior to Admission medications   Medication Sig Start Date End Date Taking? Authorizing Provider  acetaminophen (TYLENOL) 500 MG tablet Take 1,000 mg by mouth every 6 (six) hours as needed for moderate pain or headache.   Yes [provider]  ARIPiprazole (ABILIFY) 5 MG tablet Take 1 tablet (5 mg total) by mouth daily. 09/15/20 12/14/20 Yes Hisada, Elie Goody, MD  carvedilol (COREG) 12.5 MG tablet Take 12.5 mg by mouth 2 (two) times daily. 10/14/19  Yes [provider]  cholecalciferol (VITAMIN D) 1000 units tablet Take 1,000 Units by mouth daily.    Yes [provider]  COMBIGAN 0.2-0.5 % ophthalmic solution INSTILL ONE DROP IN [facility] EYE TWICE DAILY Patient taking differently: 1 drop once. 05/17/20  Yes Rankin, Clent Demark, MD  cyclobenzaprine (FLEXERIL) 5 MG tablet TAKE ONE TABLET BY MOUTH THREE TIMES DAILY AS NEEDED FOR MUSCLE SPASM 12/18/19  Yes Dettinger, Fransisca Kaufmann, MD  gabapentin (NEURONTIN) 100 MG capsule Take 1 capsule (100 mg total) by mouth 3 (three) times daily. (Needs to be seen before next refill) 11/25/19  Yes Dettinger, Fransisca Kaufmann, MD  LANTUS SOLOSTAR 100 UNIT/ML Solostar Pen INJECT 18-50 UNITS SUBCUTANEOUSLY AT BEDTIME. Patient taking  differently: Inject 20 Units into the skin at bedtime. 10/01/19  Yes Dettinger, Fransisca Kaufmann, MD  linagliptin (TRADJENTA) 5 MG TABS tablet Take 1 tablet (5 mg total) by mouth daily. Patient taking differently: Take 5 mg by mouth every evening. 03/18/19  Yes Dettinger, Fransisca Kaufmann, MD  pantoprazole (PROTONIX) 40 MG tablet TAKE 1 BY MOUTH DAILY 30 minutes before breakfast Patient taking differently: Take 40 mg by mouth daily. 02/02/19  Yes Dettinger, Fransisca Kaufmann, MD  Probiotic Product (CULTURELLE PROBIOTICS PO) Take 1 capsule by mouth daily.    Yes [provider]  sertraline (ZOLOFT) 100 MG tablet Take 1.5 tablets (150 mg total) by mouth daily. 09/06/20  Yes [name], MD  sevelamer carbonate (RENVELA) 800 MG tablet Take 800-2,400 mg by mouth See admin instructions. Take 2454m by mouth three times daily with means and 8034mwith snacks   Yes [provider]  SYMBICORT 160-4.5 MCG/ACT inhaler  12/07/19  Yes [provider]  VENTOLIN HFA 108 (90 Base) MCG/ACT inhaler  11/27/19  Yes [provider]  azelastine (ASTELIN) 0.1 % nasal spray Place 1 spray into both nostrils 2 (two) times daily. Use in each nostril as directed Patient not taking: Reported on 10/05/2020 08/29/18   Dettinger, JoFransisca KaufmannMD  Continuous Blood Gluc Sensor (FREESTYLE LIBRE 14 DAY SENSOR) MISC Inject 1 each into the skin every 14 (fourteen) days. Use as directed. 11/27/17   NiCassandria AngerMD  diclofenac sodium (VOLTAREN) 1 % GEL Apply 2 g topically 4 (four) times daily. Patient not taking: Reported on 10/05/2020 11/14/18   GoRonnie Doss, DO  glucose blood (PRODIGY NO CODING BLOOD GLUC) test strip USE TO CHECK BLOOD SUGAR TWICE DAILY. Dx E11.22 Patient not taking: Reported on 10/05/2020 02/03/19   Dettinger, JoFransisca KaufmannMD  ibuprofen (ADVIL) 200 MG tablet Take 600 mg by mouth every 6 (six) hours as needed for headache or moderate pain. Patient not taking: Reported on 10/05/2020    [provider]   Insulin Pen Needle (TRUEPLUS PEN NEEDLES) 31G X 5 MM MISC Use daily with insulin Dx E11.22 03/09/19   Dettinger, JoFransisca KaufmannMD  levofloxacin (LEVAQUIN) 750 MG tablet  05/27/20   [provider]  multivitamin (RENA-VIT) TABS tablet Take 1 tablet by mouth daily. Patient not taking: Reported on 10/05/2020 04/03/19   [provider]  PRODIGY TWIST TOP LANCETS 28G MISC USE TO CHECK BLOOD SUGAR UP TO FOUR TIMES DAILY. 03/13/17   BrTimmothy EulerMD    Physical Exam: Vitals:  10/05/20 1400 10/05/20 1430 10/05/20 1701 10/05/20 1705  BP: (!) 154/60 (!) 176/82  (!) 193/96  Pulse: 70 68  64  Resp: 19 (!) 27  16  Temp:      TempSrc:      SpO2: 100% 98% (S) (!) 86% 100%  Weight:      Height:        Constitutional: NAD, calm, comfortable Vitals:   10/05/20 1400 10/05/20 1430 10/05/20 1701 10/05/20 1705  BP: (!) 154/60 (!) 176/82  (!) 193/96  Pulse: 70 68  64  Resp: 19 (!) 27  16  Temp:      TempSrc:      SpO2: 100% 98% (S) (!) 86% 100%  Weight:      Height:       Eyes: Unequal pupils- blind left eye, reduced light reactivity left eye- able to finger count.  lids and conjunctivae normal ENMT: Mucous membranes are dry Neck: normal, supple, no masses, no thyromegaly Respiratory: Diffuse expiratory wheezing, appreciated even without stethoscope, mild increased work of breathing,mild accessory muscle use. Cardiovascular: Regular rate and rhythm, no murmurs / rubs / gallops. No extremity edema. 2+ pedal pulses.   Abdomen: no tenderness, no masses palpated. No hepatosplenomegaly. Bowel sounds positive.  Musculoskeletal: no clubbing / cyanosis. No joint deformity upper and lower extremities. Good ROM, no contractures. Normal muscle tone.  Skin: no rashes, lesions, ulcers. No induration Neurologic: No apparent cranial abnormality, moving extremities spontaneously Psychiatric: Normal judgment and insight. Alert and oriented x 3. Normal mood.   Labs on Admission: I have personally  reviewed following labs and imaging studies  CBC: Recent Labs  Lab 10/05/20 1339  WBC 3.2*  NEUTROABS 1.8  HGB 8.2*  HCT 26.8*  MCV 95.4  PLT 016*   Basic Metabolic Panel: Recent Labs  Lab 10/05/20 1339  NA 136  K 4.1  CL 98  CO2 26  GLUCOSE 115*  BUN 30*  CREATININE 7.32*  CALCIUM 8.8*   Liver Function Tests: Recent Labs  Lab 10/05/20 1339  AST 23  ALT 25  ALKPHOS 84  BILITOT 0.6  PROT 7.0  ALBUMIN 3.6    Radiological Exams on Admission: DG Chest Port 1 View  Result Date: 10/05/2020 CLINICAL DATA:  Shortness of breath for the past 3 days. EXAM: PORTABLE CHEST 1 VIEW COMPARISON:  Chest x-ray dated June 13, 2019. CT chest dated April 02, 2018. FINDINGS: The heart size and mediastinal contours are within normal limits. Both lungs are clear. The visualized skeletal structures are unremarkable. Partially visualized vascular stent in the right upper arm. IMPRESSION: 1. No active disease. Electronically Signed   By: Titus Dubin M.D.   On: 10/05/2020 14:21    EKG: Independently reviewed.  Sinus rhythm rate 69.Marland Kitchen  QTc 485.  No significant change from prior.  Assessment/Plan Principal Problem:   COPD with acute exacerbation (HCC) Active Problems:   Acute respiratory failure with hypoxia (HCC)   Essential hypertension, benign   Chronic diastolic CHF (congestive heart failure) (HCC)   Type 2 diabetes mellitus with ESRD (end-stage renal disease) ([place])   ESRD on dialysis ([place])   Legally blind   MDD (major depressive disorder), recurrent episode, moderate (HCC)   Hepatic cirrhosis ([place])  COPD exacerbation with acute hypoxic respiratory failure-dyspnea, cough, wheezing, O2 sats down to 86% on room air with ambulation.  Currently on 2 L sats 99%.  Portable chest x-ray without acute abnormality.  Not on home O2. - Patient reports steroids keep her awake  and she is unable to eat, she has agreed to take steroids for now  -IV Solu-Medrol 125 given, continue 60 twice  daily - Oral doxycycline -DuoNebs as needed and scheduled - Xanax 0.34m QHs PRN -Supplemental O2, mucolytic's  ESRD-schedule Tuesday Thursday Saturday.  Last HD yesterday Tuesday. -Nephrology consultation in a.m  Diabetes mellitus-random glucose 115. -Resume home Lantus 20 nightly - Hold tradjenta - SSI - S - hgbA1c  Hypertension-elevated systolic 1872Bto 2618 -Resume carvedilol 12.5 mg twice daily -IV hydralazine 10 mg for systolic greater than 1485 -May need additional antihypertensives for better control  Diastolic CHF-stable and compensated.  Not on diuretics.  Last echo 2018 EF 55 to 60% with grade 2 diastolic dysfunction.  Legally blind  Ongoing Tobacco use- 1 PPD.  DVT prophylaxis: Heparin Code Status: Full code Family Communication: None at bedside Disposition Plan:  ~ 2 days Consults called:  Pls consult Nephrology in the morning. Admission status: Obs, tele  EBethena RoysMD [facility]  10/05/2020, 8:17 PM

## 2020-10-05 NOTE — ED Triage Notes (Signed)
Pt brought to ED via [facility] for increased SOB x couple of days. Pt sat 90% on RA per EMS and started on 2L. Pt last dialysis treatment yesterday.

## 2020-10-05 NOTE — ED Provider Notes (Signed)
[facility] EMERGENCY DEPARTMENT Provider Note   CSN: [id number] Arrival date & time: 10/05/20  1246     History Chief Complaint  Patient presents with  . Shortness of Breath    Kara Knapp is a 50 y.o. female.  Patient presents to ED for evaluation of shortness of breath onset 4 days ago. She endorses occasional exertional dyspnea. ESRD, on dialysis, last dialyzed yesterday (10/04/20). She reports associated fatigue, non-productive cough. Nasal congestion.  The history is provided by the patient and medical records. No language interpreter was used.  Shortness of Breath Severity:  Moderate Onset quality:  Gradual Timing:  Intermittent Progression:  Worsening Chronicity:  New Worsened by:  Activity Associated symptoms: cough and fever   Associated symptoms: no chest pain        Past Medical History:  Diagnosis Date  . Anemia of chronic disease   . Anxiety   . Asthma   . Blind left eye   . Bronchitis   . Cataract   . Cholecystitis, acute 05/26/2013   Status post cholecystectomy  . Chronic abdominal pain   . Chronic diarrhea   . COPD (chronic obstructive pulmonary disease) ([place])   . Depression   . Diabetic foot ulcer ([place]) 03/01/2015  . Diastolic heart failure ([place])   . ESRD on hemodialysis ([place])    Started diaylsis 12/29/15  . Essential hypertension   . Fibroids   . Glaucoma   . History of blood transfusion   . History of pneumonia   . Hyperlipidemia   . Insulin-dependent diabetes mellitus with retinopathy   . Liver fibrosis    Negative Hep B surface antigen, negative Hep C antibody Feb 2018 (see scanned in labs).  . Neuropathy   . Osteomyelitis ([place])    Toe on left foot    Patient Active Problem List   Diagnosis Date Noted  . Hepatic cirrhosis ([place]) 04/29/2020  . Diabetic macular edema of right eye with proliferative retinopathy associated with type 2 diabetes mellitus ([place]) 01/13/2020  . Rubeosis iridis of right eye 01/13/2020  . Vitreous  hemorrhage of right eye ([place]) 01/13/2020  . GERD (gastroesophageal reflux disease) 09/02/2019  . MDD (major depressive disorder), recurrent, in partial remission ([place]) 08/21/2019  . Elevated ferritin 05/10/2019  . MDD (major depressive disorder), recurrent episode, moderate ([place]) 05/28/2018  . Cocaine use disorder, severe, in sustained remission ([place]) 05/28/2018  . Flatulence 12/18/2017  . Mixed hyperlipidemia 11/28/2017  . Constipation 09/18/2017  . Recurrent infection of skin 08/21/2017  . Ascites 12/21/2016  . Fatty liver 11/08/2016  . Rectal bleeding 09/07/2016  . Legally blind 09/06/2016  . Hepatomegaly 05/04/2016  . Abnormal LFTs 05/04/2016  . Chronic diarrhea 05/04/2016  . Esophageal dysphagia 05/04/2016  . Mood disorder ([place]) 08/12/2015  . Fibroid, uterine 01/24/2015  . Iron deficiency anemia due to chronic blood loss 01/24/2015  . Menorrhagia with irregular cycle 01/24/2015  . Chronic diastolic CHF (congestive heart failure) ([place]) 01/21/2015  . Type 2 diabetes mellitus with ESRD (end-stage renal disease) ([place]) 01/21/2015  . Diabetic neuropathy ([place]) 01/21/2015  . ESRD on dialysis ([place]) 01/21/2015  . Current smoker 01/21/2015  . Essential hypertension, benign 05/25/2013    Past Surgical History:  Procedure Laterality Date  . A/V SHUNTOGRAM N/A 10/25/2016   Procedure: A/V Shuntogram - Right Arm;  Surgeon: Waynetta Sandy, MD;  Location: [place] [facility];  Service: Cardiovascular;  Laterality: N/A;  . A/V SHUNTOGRAM N/A 03/05/2018   Procedure: A/V SHUNTOGRAM - Right Arm;  Surgeon: Waynetta Sandy, MD;  Location: [place] [facility];  Service: Cardiovascular;  Laterality: N/A;  . AV FISTULA PLACEMENT Right 10/17/2015   Procedure: INSERTION OF ARTERIOVENOUS GORE-TEX GRAFT RIGHT UPPER ARM WITH ACUSEAL;  Surgeon: Conrad Crowell, MD;  Location: [facility] OR;  Service: Vascular;  Laterality: Right;  . AV FISTULA PLACEMENT Left 11/18/2019   Procedure: INSERTION OF  ARTERIOVENOUS (AV) GORE-TEX GRAFT left  ARM;  Surgeon: Serafina Mitchell, MD;  Location: [facility] [facility];  Service: Vascular;  Laterality: Left;  . CATARACT EXTRACTION W/ INTRAOCULAR LENS IMPLANT Bilateral   . CESAREAN SECTION    . CHOLECYSTECTOMY N/A 05/25/2013   Procedure: LAPAROSCOPIC CHOLECYSTECTOMY;  Surgeon: Jamesetta So, MD;  Location: [facility];  Service: General;  Laterality: N/A;  . COLONOSCOPY WITH PROPOFOL N/A 10/02/2016   two 3 to 5 mm polyps in the descending colon, three 2 to 3 mm polyps in the rectum, random colon biopsies, rectal bleeding due to internal hemorrhoids, friability with no bleeding at the anus status post biopsy.  Surgical pathology found the polyps to be a mix of hyperplastic and tubular adenoma, random colon biopsies to be benign colonic mucosa, and the anal biopsies to be anal skin tag.   Marland Kitchen ESOPHAGOGASTRODUODENOSCOPY (EGD) WITH PROPOFOL N/A 10/02/2016   mucosal nodule in the esophagus status post biopsy, moderate gastritis status post biopsy, mild duodenitis. esophageal biopsy to be benign, gastric biopsies to be gastritis due to aspirin use, and duodenal biopsies to be duodenitis due to aspirin use  . EYE SURGERY Bilateral   . IR FLUORO GUIDE CV LINE RIGHT  10/09/2019  . IR REMOVAL TUN CV CATH W/O FL  01/15/2020  . IR THROMBECTOMY AV FISTULA W/THROMBOLYSIS/PTA INC/SHUNT/IMG RIGHT Right 12/26/2018  . IR THROMBECTOMY AV FISTULA W/THROMBOLYSIS/PTA INC/SHUNT/IMG RIGHT Right 08/10/2019  . IR THROMBECTOMY AV FISTULA W/THROMBOLYSIS/PTA/STENT INC/SHUNT/IMG RT Right 08/08/2018  . IR THROMBECTOMY AV FISTULA W/THROMBOLYSIS/PTA/STENT INC/SHUNT/IMG RT Right 05/15/2019  . IR US GUIDE VASC ACCESS RIGHT  08/08/2018  . IR US GUIDE VASC ACCESS RIGHT  12/26/2018  . IR US GUIDE VASC ACCESS RIGHT  05/15/2019  . IR US GUIDE VASC ACCESS RIGHT  08/10/2019  . IR US GUIDE VASC ACCESS RIGHT  10/09/2019  . PARS PLANA VITRECTOMY Left 11/24/2014   Procedure: PARS PLANA VITRECTOMY WITH 25 GAUGE;  Surgeon: Hurman Horn, MD;  Location: [place];  Service: Ophthalmology;  Laterality: Left;  . PERIPHERAL VASCULAR BALLOON ANGIOPLASTY Right 03/05/2018   Procedure: PERIPHERAL VASCULAR BALLOON ANGIOPLASTY;  Surgeon: Waynetta Sandy, MD;  Location: [place] [facility];  Service: Cardiovascular;  Laterality: Right;  arm fistula  . PERIPHERAL VASCULAR CATHETERIZATION N/A 04/28/2015   Procedure: Bilateral Upper Extremity Venography;  Surgeon: Conrad [place], MD;  Location: [place] [facility];  Service: Cardiovascular;  Laterality: N/A;  . PHOTOCOAGULATION WITH LASER Left 11/24/2014   Procedure: PHOTOCOAGULATION WITH LASER;  Surgeon: Hurman Horn, MD;  Location: [place];  Service: Ophthalmology;  Laterality: Left;  with insertion of silicone oil  . SAVORY DILATION N/A 10/02/2016   Procedure: SAVORY DILATION;  Surgeon: Danie Binder, MD;  [facility];  Service: Endoscopy;  Laterality: N/A;  . TUBAL LIGATION    . UPPER EXTREMITY VENOGRAPHY Bilateral 11/02/2019   Procedure: UPPER EXTREMITY VENOGRAPHY;  Surgeon: Waynetta Sandy, MD;  Location: [place] [facility];  Service: Cardiovascular;  Laterality: Bilateral;     OB History    Gravida  5   Para  5  Term      Preterm      AB      Living        SAB      IAB      Ectopic      Multiple      Live Births              Family History  Problem Relation Age of Onset  . COPD Mother   . Cancer Father   . Lymphoma Father   . Diabetes Sister   . Deep vein thrombosis Sister   . Diabetes Brother   . Hyperlipidemia Brother   . Hypertension Brother   . Mental retardation Sister   . Alcohol abuse Paternal Grandmother   . Colon cancer Neg Hx   . Liver disease Neg Hx     Social History   Tobacco Use  . Smoking status: Current Every Day Smoker    Packs/day: 1.00    Years: 23.00    Pack years: 23.00    Types: Cigarettes    Start date: 12/03/2000  . Smokeless tobacco: Never Used  . Tobacco comment: one pack daily  Vaping Use   . Vaping Use: Never used  Substance Use Topics  . Alcohol use: No    Alcohol/week: 0.0 standard drinks  . Drug use: No    Comment: Sober for 8 years    Home Medications Prior to Admission medications   Medication Sig Start Date End Date Taking? Authorizing Provider  acetaminophen (TYLENOL) 500 MG tablet Take 1,000 mg by mouth every 6 (six) hours as needed for moderate pain or headache.    [provider]  ARIPiprazole (ABILIFY) 5 MG tablet Take 1 tablet (5 mg total) by mouth daily. 09/15/20 12/14/20  [name], MD  azelastine (ASTELIN) 0.1 % nasal spray Place 1 spray into both nostrils 2 (two) times daily. Use in each nostril as directed Patient taking differently: Place 1 spray into both nostrils daily as needed for rhinitis.  08/29/18   Dettinger, Fransisca Kaufmann, MD  buPROPion HCl (WELLBUTRIN XL PO) Take by mouth. Patient not taking: Reported on 06/06/2020    [provider]  carvedilol (COREG) 12.5 MG tablet Take 12.5 mg by mouth 2 (two) times daily. 10/14/19   [provider]  cholecalciferol (VITAMIN D) 1000 units tablet Take 1,000 Units by mouth daily.     [provider]  COMBIGAN 0.2-0.5 % ophthalmic solution INSTILL ONE DROP IN [facility] EYE TWICE DAILY Patient taking differently: 1 drop once. 05/17/20   Rankin, Clent Demark, MD  Continuous Blood Gluc Sensor (FREESTYLE LIBRE 14 DAY SENSOR) MISC Inject 1 each into the skin every 14 (fourteen) days. Use as directed. 11/27/17   Cassandria Anger, MD  cyclobenzaprine (FLEXERIL) 5 MG tablet TAKE ONE TABLET BY MOUTH THREE TIMES DAILY AS NEEDED FOR MUSCLE SPASM 12/18/19   Dettinger, Fransisca Kaufmann, MD  diclofenac sodium (VOLTAREN) 1 % GEL Apply 2 g topically 4 (four) times daily. Patient taking differently: Apply 2 g topically 4 (four) times daily as needed (pain).  11/14/18   Janora Norlander, DO  gabapentin (NEURONTIN) 100 MG capsule Take 1 capsule (100 mg total) by mouth 3 (three) times daily. (Needs to be seen before  next refill) 11/25/19   Dettinger, Fransisca Kaufmann, MD  glucose blood (PRODIGY NO CODING BLOOD GLUC) test strip USE TO CHECK BLOOD SUGAR TWICE DAILY. Dx E11.22 02/03/19   Dettinger, Fransisca Kaufmann, MD  ibuprofen (ADVIL) 200 MG tablet  Take 600 mg by mouth every 6 (six) hours as needed for headache or moderate pain.    [provider]  Insulin Pen Needle (TRUEPLUS PEN NEEDLES) 31G X 5 MM MISC Use daily with insulin Dx E11.22 03/09/19   Dettinger, Fransisca Kaufmann, MD  LANTUS SOLOSTAR 100 UNIT/ML Solostar Pen INJECT 18-50 UNITS SUBCUTANEOUSLY AT BEDTIME. Patient taking differently: Inject 20 Units into the skin at bedtime.  10/01/19   Dettinger, Fransisca Kaufmann, MD  levofloxacin (LEVAQUIN) 750 MG tablet  05/27/20   [provider]  lidocaine-prilocaine (EMLA) cream Apply 1 application topically daily as needed (arm port). Patient not taking: Reported on 05/30/2020    [provider]  linagliptin (TRADJENTA) 5 MG TABS tablet Take 1 tablet (5 mg total) by mouth daily. Patient taking differently: Take 5 mg by mouth every evening.  03/18/19   Dettinger, Fransisca Kaufmann, MD  multivitamin (RENA-VIT) TABS tablet Take 1 tablet by mouth daily. 04/03/19   [provider]  pantoprazole (PROTONIX) 40 MG tablet TAKE 1 BY MOUTH DAILY 30 minutes before breakfast Patient taking differently: Take 40 mg by mouth daily.  02/02/19   Dettinger, Fransisca Kaufmann, MD  Probiotic Product (CULTURELLE PROBIOTICS PO) Take 1 capsule by mouth daily.     [provider]  PRODIGY TWIST TOP LANCETS 28G MISC USE TO CHECK BLOOD SUGAR UP TO FOUR TIMES DAILY. 03/13/17   Timmothy Euler, MD  sertraline (ZOLOFT) 100 MG tablet Take 1.5 tablets (150 mg total) by mouth daily. 09/06/20   [name], MD  sevelamer carbonate (RENVELA) 800 MG tablet Take 800-2,400 mg by mouth See admin instructions. Take 2453m by mouth three times daily with means and 8038mwith snacks    [provider]  SYMBICORT 160-4.5 MCG/ACT inhaler  12/07/19   [provider]  VENTOLIN HFA 108 (90 Base) MCG/ACT inhaler  11/27/19   [provider]    Allergies    Sulfamethoxazole-trimethoprim, Bactrim [sulfamethoxazole-trimethoprim], and Prednisone  Review of Systems   Review of Systems  Constitutional: Positive for fatigue and fever.  HENT: Positive for congestion.   Respiratory: Positive for cough and shortness of breath.   Cardiovascular: Negative for chest pain and leg swelling.  All other systems reviewed and are negative.   Physical Exam Updated Vital Signs BP (!) 162/66 (BP Location: Left Leg)   Pulse 69   Temp 98.1 F (36.7 C) (Oral)   Resp 20   Ht 5' 3"  (1.6 m)   Wt 85.3 kg   LMP 05/05/2015   SpO2 95%   BMI 33.30 kg/m   Physical Exam Vitals and nursing note reviewed.  Constitutional:      Appearance: She is well-developed. She is not ill-appearing.  HENT:     Head: Normocephalic.     Mouth/Throat:     Mouth: Mucous membranes are moist.  Cardiovascular:     Rate and Rhythm: Normal rate and regular rhythm.  Pulmonary:     Effort: Pulmonary effort is normal.     Breath sounds: Examination of the right-middle field reveals rhonchi. Examination of the left-middle field reveals rhonchi. Examination of the right-lower field reveals rhonchi. Examination of the left-lower field reveals rhonchi. Rhonchi present.  Chest:     Chest wall: No tenderness.  Abdominal:     Palpations: Abdomen is soft.  Musculoskeletal:     Cervical back: Neck supple.     Right lower leg: No edema.     Left lower leg: No edema.  Skin:  General: Skin is warm and dry.  Neurological:     General: No focal deficit present.     Mental Status: She is alert.  Psychiatric:        Mood and Affect: Mood normal.     ED Results / Procedures / Treatments   Labs (all labs ordered are listed, but only abnormal results are displayed) Labs Reviewed  CBC WITH DIFFERENTIAL/PLATELET - Abnormal; Notable for the following components:      Result  Value   WBC 3.2 (*)    RBC 2.81 (*)    Hemoglobin 8.2 (*)    HCT 26.8 (*)    Platelets 139 (*)    All other components within normal limits  COMPREHENSIVE METABOLIC PANEL - Abnormal; Notable for the following components:   Glucose, Bld 115 (*)    BUN 30 (*)    Creatinine, Ser 7.32 (*)    Calcium 8.8 (*)    GFR, Estimated 6 (*)    All other components within normal limits  RESP PANEL BY RT-PCR (FLU A&B, COVID) [id number]    EKG EKG Interpretation  Date/Time:  Wednesday October 05 2020 12:58:11 EST Ventricular Rate:  69 PR Interval:    QRS Duration: 92 QT Interval:  452 QTC Calculation: 485 R Axis:   73 Text Interpretation: Sinus rhythm Confirmed by Fredia Sorrow [phone]) on 10/05/2020 1:00:47 PM   Radiology DG Chest Port 1 View  Result Date: 10/05/2020 CLINICAL DATA:  Shortness of breath for the past 3 days. EXAM: PORTABLE CHEST 1 VIEW COMPARISON:  Chest x-ray dated June 13, 2019. CT chest dated April 02, 2018. FINDINGS: The heart size and mediastinal contours are within normal limits. Both lungs are clear. The visualized skeletal structures are unremarkable. Partially visualized vascular stent in the right upper arm. IMPRESSION: 1. No active disease. Electronically Signed   By: Titus Dubin M.D.   On: 10/05/2020 14:21    Procedures Procedures   Medications Ordered in ED Medications  methylPREDNISolone sodium succinate (SOLU-MEDROL) 125 mg/2 mL injection 125 mg (has no administration in time range)  albuterol (PROVENTIL) (2.5 MG/3ML) 0.083% nebulizer solution 5 mg (5 mg Nebulization Given 10/05/20 1701)    ED Course  I have reviewed the triage vital signs and the nursing notes.  Pertinent labs & imaging results that were available during my care of the patient were reviewed by me and considered in my medical decision making (see chart for details).    MDM Rules/Calculators/A&P                          Patient with COPD exacerbation. Increased work of breathing at  rest. Mild anemia. Shortness of breath worsens with exertion. COVID and influenza negative. Patient given albuterol nebulizer in ED. Patient ambulated after treatment, became short of breath, with saturations again dropping into the upper 80's. Saturations at rest, with oxygen, in mid 90's. Patient does not use oxygen at home. Will request admission. Due for dialysis tomorrow.  Spoke with hospitalist (E. Emokpae)--will admit. Final Clinical Impression(s) / ED Diagnoses Final diagnoses:  COPD exacerbation [facility])    Rx / DC Orders ED Discharge Orders    None       Etta Quill, NP 10/05/20 Derrick Ravel, MD 10/18/20 [phone]

## 2020-10-05 NOTE — ED Notes (Signed)
ED TO INPATIENT HANDOFF REPORT  ED Nurse Name and Phone #:   S Name/Age/Gender Kara Knapp 50 y.o. female Room/Bed: APA10/APA10  Code Status   Code Status: Prior  Home/SNF/Other Home Patient oriented to: self, place, time and situation Is this baseline? Yes   Triage Complete: Triage complete  Chief Complaint COPD with acute exacerbation ([place]) [J44.1]  Triage Note Pt brought to ED via [facility] for increased SOB x couple of days. Pt sat 90% on RA per EMS and started on 2L. Pt last dialysis treatment yesterday.     Allergies Allergies  Allergen Reactions  . Sulfamethoxazole-Trimethoprim Nausea And Vomiting  . Bactrim [Sulfamethoxazole-Trimethoprim] Nausea And Vomiting  . Prednisone Other (See Comments)    "I was wide open and couldn't eat" per pt. Loss of appetite and insomnia  LOSS OF APPETITE,UNABLE TO SLEEP    Level of Care/Admitting Diagnosis ED Disposition    ED Disposition Condition Comment   Admit  Hospital Area: [facility] [[id number]]  Level of Care: Telemetry [5]  Covid Evaluation: Confirmed COVID Negative  Diagnosis: COPD with acute exacerbation [facility]) [[id number]]  Admitting Physician: Bethena Roys [phone]  Attending Physician: Bethena Roys Nessa.Cuff       B Medical/Surgery History Past Medical History:  Diagnosis Date  . Anemia of chronic disease   . Anxiety   . Asthma   . Blind left eye   . Bronchitis   . Cataract   . Cholecystitis, acute 05/26/2013   Status post cholecystectomy  . Chronic abdominal pain   . Chronic diarrhea   . COPD (chronic obstructive pulmonary disease) ([place])   . Depression   . Diabetic foot ulcer ([place]) 03/01/2015  . Diastolic heart failure ([place])   . ESRD on hemodialysis ([place])    Started diaylsis 12/29/15  . Essential hypertension   . Fibroids   . Glaucoma   . History of blood transfusion   . History of pneumonia   . Hyperlipidemia   . Insulin-dependent diabetes mellitus with retinopathy   .  Liver fibrosis    Negative Hep B surface antigen, negative Hep C antibody Feb 2018 (see scanned in labs).  . Neuropathy   . Osteomyelitis ([place])    Toe on left foot   Past Surgical History:  Procedure Laterality Date  . A/V SHUNTOGRAM N/A 10/25/2016   Procedure: A/V Shuntogram - Right Arm;  Surgeon: Waynetta Sandy, MD;  Location: [place] CV LAB;  Service: Cardiovascular;  Laterality: N/A;  . A/V SHUNTOGRAM N/A 03/05/2018   Procedure: A/V SHUNTOGRAM - Right Arm;  Surgeon: Waynetta Sandy, MD;  Location: [place] CV LAB;  Service: Cardiovascular;  Laterality: N/A;  . AV FISTULA PLACEMENT Right 10/17/2015   Procedure: INSERTION OF ARTERIOVENOUS GORE-TEX GRAFT RIGHT UPPER ARM WITH ACUSEAL;  Surgeon: Conrad [place], MD;  Location: [facility] OR;  Service: Vascular;  Laterality: Right;  . AV FISTULA PLACEMENT Left 11/18/2019   Procedure: INSERTION OF ARTERIOVENOUS (AV) GORE-TEX GRAFT left  ARM;  Surgeon: Serafina Mitchell, MD;  Location: [place];  Service: Vascular;  Laterality: Left;  . CATARACT EXTRACTION W/ INTRAOCULAR LENS IMPLANT Bilateral   . CESAREAN SECTION    . CHOLECYSTECTOMY N/A 05/25/2013   Procedure: LAPAROSCOPIC CHOLECYSTECTOMY;  Surgeon: Jamesetta So, MD;  Location: [facility];  Service: General;  Laterality: N/A;  . COLONOSCOPY WITH PROPOFOL N/A 10/02/2016   two 3 to 5 mm polyps in the descending colon, three 2 to 3 mm polyps in the rectum, random  colon biopsies, rectal bleeding due to internal hemorrhoids, friability with no bleeding at the anus status post biopsy.  Surgical pathology found the polyps to be a mix of hyperplastic and tubular adenoma, random colon biopsies to be benign colonic mucosa, and the anal biopsies to be anal skin tag.   Marland Kitchen ESOPHAGOGASTRODUODENOSCOPY (EGD) WITH PROPOFOL N/A 10/02/2016   mucosal nodule in the esophagus status post biopsy, moderate gastritis status post biopsy, mild duodenitis. esophageal biopsy to be benign, gastric biopsies to be gastritis  due to aspirin use, and duodenal biopsies to be duodenitis due to aspirin use  . EYE SURGERY Bilateral   . IR FLUORO GUIDE CV LINE RIGHT  10/09/2019  . IR REMOVAL TUN CV CATH W/O FL  01/15/2020  . IR THROMBECTOMY AV FISTULA W/THROMBOLYSIS/PTA INC/SHUNT/IMG RIGHT Right 12/26/2018  . IR THROMBECTOMY AV FISTULA W/THROMBOLYSIS/PTA INC/SHUNT/IMG RIGHT Right 08/10/2019  . IR THROMBECTOMY AV FISTULA W/THROMBOLYSIS/PTA/STENT INC/SHUNT/IMG RT Right 08/08/2018  . IR THROMBECTOMY AV FISTULA W/THROMBOLYSIS/PTA/STENT INC/SHUNT/IMG RT Right 05/15/2019  . IR US GUIDE VASC ACCESS RIGHT  08/08/2018  . IR US GUIDE VASC ACCESS RIGHT  12/26/2018  . IR US GUIDE VASC ACCESS RIGHT  05/15/2019  . IR US GUIDE VASC ACCESS RIGHT  08/10/2019  . IR US GUIDE VASC ACCESS RIGHT  10/09/2019  . PARS PLANA VITRECTOMY Left 11/24/2014   Procedure: PARS PLANA VITRECTOMY WITH 25 GAUGE;  Surgeon: Hurman Horn, MD;  Location: [place];  Service: Ophthalmology;  Laterality: Left;  . PERIPHERAL VASCULAR BALLOON ANGIOPLASTY Right 03/05/2018   Procedure: PERIPHERAL VASCULAR BALLOON ANGIOPLASTY;  Surgeon: Waynetta Sandy, MD;  Location: [place] CV LAB;  Service: Cardiovascular;  Laterality: Right;  arm fistula  . PERIPHERAL VASCULAR CATHETERIZATION N/A 04/28/2015   Procedure: Bilateral Upper Extremity Venography;  Surgeon: Conrad [place], MD;  Location: [place] CV LAB;  Service: Cardiovascular;  Laterality: N/A;  . PHOTOCOAGULATION WITH LASER Left 11/24/2014   Procedure: PHOTOCOAGULATION WITH LASER;  Surgeon: Hurman Horn, MD;  Location: [place];  Service: Ophthalmology;  Laterality: Left;  with insertion of silicone oil  . SAVORY DILATION N/A 10/02/2016   Procedure: SAVORY DILATION;  Surgeon: Danie Binder, MD;  [facility];  Service: Endoscopy;  Laterality: N/A;  . TUBAL LIGATION    . UPPER EXTREMITY VENOGRAPHY Bilateral 11/02/2019   Procedure: UPPER EXTREMITY VENOGRAPHY;  Surgeon: Waynetta Sandy, MD;  Location: [employer] CV LAB;  Service: Cardiovascular;  Laterality: Bilateral;     A IV Location/Drains/Wounds Patient Lines/Drains/Airways Status    Active Line/Drains/Airways    Name Placement date Placement time Site Days   Peripheral IV 10/05/20 Right Forearm 10/05/20  1928  Forearm  less than 1   Fistula / Graft Right Upper arm Arteriovenous vein graft 10/17/15  0759  Upper arm  1815   Fistula / Graft Right Upper arm Arteriovenous vein graft 10/25/16  1232  Upper arm  1441   Fistula / Graft Left Upper arm Arteriovenous vein graft 11/18/19  1032  Upper arm  322   Hemodialysis Catheter Right Internal jugular Double lumen Permanent (Tunneled) 10/09/19  1107  Internal jugular  362   Incision (Closed) 10/17/15 Arm Right 10/17/15  0808  -- 1815   Incision (Closed) 02/07/18 Abdomen Right;Upper 02/07/18  --  -- 971   Incision (Closed) 12/26/18 Arm Right 12/26/18  1522  -- 649   Incision (Closed) 10/09/19 Neck Right;Lateral;Anterior 10/09/19  1119  -- 362   Incision (Closed) 10/09/19 Chest Right;Anterior;Distal 10/09/19  1120  -- 362   Incision (Closed) 11/18/19 Arm Left 11/18/19  1107  -- 322          Intake/Output Last 24 hours No intake or output data in the 24 hours ending 10/05/20 2034  Labs/Imaging Results for orders placed or performed during the hospital encounter of 10/05/20 (from the past 48 hour(s))  CBC with Differential/Platelet     Status: Abnormal   Collection Time: 10/05/20  1:39 PM  Result Value Ref Range   WBC 3.2 (L) 4.0 - 10.5 K/uL   RBC 2.81 (L) 3.87 - 5.11 MIL/uL   Hemoglobin 8.2 (L) 12.0 - 15.0 g/dL   HCT 26.8 (L) 36.0 - 46.0 %   MCV 95.4 80.0 - 100.0 fL   MCH 29.2 26.0 - 34.0 pg   MCHC 30.6 30.0 - 36.0 g/dL   RDW 15.0 11.5 - 15.5 %   Platelets 139 (L) 150 - 400 K/uL   nRBC 0.0 0.0 - 0.2 %   Neutrophils Relative % 57 %   Neutro Abs 1.8 1.7 - 7.7 K/uL   Lymphocytes Relative 23 %   Lymphs Abs 0.7 0.7 - 4.0 K/uL   Monocytes Relative 18 %   Monocytes Absolute 0.6 0.1  - 1.0 K/uL   Eosinophils Relative 1 %   Eosinophils Absolute 0.0 0.0 - 0.5 K/uL   Basophils Relative 1 %   Basophils Absolute 0.0 0.0 - 0.1 K/uL   Immature Granulocytes 0 %   Abs Immature Granulocytes 0.01 0.00 - 0.07 K/uL    Comment: Performed at [facility], [address] [place], [place] [address]  Comprehensive metabolic panel     Status: Abnormal   Collection Time: 10/05/20  1:39 PM  Result Value Ref Range   Sodium 136 135 - 145 mmol/L   Potassium 4.1 3.5 - 5.1 mmol/L   Chloride 98 98 - 111 mmol/L   CO2 26 22 - 32 mmol/L   Glucose, Bld 115 (H) 70 - 99 mg/dL    Comment: Glucose reference range applies only to samples taken after fasting for at least 8 hours.   BUN 30 (H) 6 - 20 mg/dL   Creatinine, Ser 7.32 (H) 0.44 - 1.00 mg/dL   Calcium 8.8 (L) 8.9 - 10.3 mg/dL   Total Protein 7.0 6.5 - 8.1 g/dL   Albumin 3.6 3.5 - 5.0 g/dL   AST 23 15 - 41 U/L   ALT 25 0 - 44 U/L   Alkaline Phosphatase 84 38 - 126 U/L   Total Bilirubin 0.6 0.3 - 1.2 mg/dL   GFR, Estimated 6 (L) >60 mL/min    Comment: (NOTE) Calculated using the CKD-EPI Creatinine Equation (2021)    Anion gap 12 5 - 15    Comment: Performed at [facility], [address], [place], [place] [address]  Resp Panel by RT-PCR (Flu A&B, Covid) Nasopharyngeal Swab     Status: None   Collection Time: 10/05/20  1:48 PM   Specimen: Nasopharyngeal Swab; Nasopharyngeal(NP) swabs in vial transport medium  Result Value Ref Range   SARS Coronavirus 2 by RT PCR NEGATIVE NEGATIVE    Comment: (NOTE) SARS-CoV-2 target nucleic acids are NOT DETECTED.  The SARS-CoV-2 RNA is generally detectable in upper respiratory specimens during the acute phase of infection. The lowest concentration of SARS-CoV-2 viral copies this assay can detect is 138 copies/mL. A negative result does not preclude SARS-Cov-2 infection and should not be used as the sole basis for treatment or other patient management  decisions. A negative result may occur with   improper specimen collection/handling, submission of specimen other than nasopharyngeal swab, presence of viral mutation(s) within the areas targeted by this assay, and inadequate number of viral copies(<138 copies/mL). A negative result must be combined with clinical observations, patient history, and epidemiological information. The expected result is Negative.  Fact Sheet for Patients:  EntrepreneurPulse.com.au  Fact Sheet for Healthcare Providers:  IncredibleEmployment.be  This test is no t yet approved or cleared by the [place] FDA and  has been authorized for detection and/or diagnosis of SARS-CoV-2 by FDA under an Emergency Use Authorization (EUA). This EUA will remain  in effect (meaning this test can be used) for the duration of the COVID-19 declaration under Section 564(b)(1) of the Act, 21 U.S.C.section 360bbb-3(b)(1), unless the authorization is terminated  or revoked sooner.       Influenza A by PCR NEGATIVE NEGATIVE   Influenza B by PCR NEGATIVE NEGATIVE    Comment: (NOTE) The Xpert Xpress SARS-CoV-2/FLU/RSV plus assay is intended as an aid in the diagnosis of influenza from Nasopharyngeal swab specimens and should not be used as a sole basis for treatment. Nasal washings and aspirates are unacceptable for Xpert Xpress SARS-CoV-2/FLU/RSV testing.  Fact Sheet for Patients: EntrepreneurPulse.com.au  Fact Sheet for Healthcare Providers: IncredibleEmployment.be  This test is not yet approved or cleared by the [place] FDA and has been authorized for detection and/or diagnosis of SARS-CoV-2 by FDA under an Emergency Use Authorization (EUA). This EUA will remain in effect (meaning this test can be used) for the duration of the COVID-19 declaration under Section 564(b)(1) of the Act, 21 U.S.C. section 360bbb-3(b)(1), unless the authorization is terminated or revoked.  Performed at  [facility], [address] [place], [place] [address]    DG Chest Port 1 View  Result Date: 10/05/2020 CLINICAL DATA:  Shortness of breath for the past 3 days. EXAM: PORTABLE CHEST 1 VIEW COMPARISON:  Chest x-ray dated June 13, 2019. CT chest dated April 02, 2018. FINDINGS: The heart size and mediastinal contours are within normal limits. Both lungs are clear. The visualized skeletal structures are unremarkable. Partially visualized vascular stent in the right upper arm. IMPRESSION: 1. No active disease. Electronically Signed   By: Titus Dubin M.D.   On: 10/05/2020 14:21    Pending Labs Unresulted Labs (From admission, onward)          Start     Ordered   Signed and Held  HIV Antibody (routine testing w rflx)  (HIV Antibody (Routine testing w reflex) panel)  Once,   R        Signed and Held          Vitals/Pain Today's Vitals   10/05/20 1430 10/05/20 1701 10/05/20 1705 10/05/20 1730  BP: (!) 176/82  (!) 193/96 (!) 202/74  Pulse: 68  64 67  Resp: (!) 27  16   Temp:      TempSrc:      SpO2: 98% (S) (!) 86% 100% 99%  Weight:      Height:      PainSc:        Isolation Precautions No active isolations  Medications Medications  albuterol (PROVENTIL) (2.5 MG/3ML) 0.083% nebulizer solution 5 mg (5 mg Nebulization Given 10/05/20 1701)  methylPREDNISolone sodium succinate (SOLU-MEDROL) 125 mg/2 mL injection 125 mg (125 mg Intravenous Given 10/05/20 1959)    Mobility walks Low fall risk   Focused Assessments    R Recommendations: See Admitting  Provider Note  Report given to:   Additional Notes:

## 2020-10-06 DIAGNOSIS — I120 Hypertensive chronic kidney disease with stage 5 chronic kidney disease or end stage renal disease: Secondary | ICD-10-CM | POA: Diagnosis not present

## 2020-10-06 DIAGNOSIS — Z992 Dependence on renal dialysis: Secondary | ICD-10-CM | POA: Diagnosis not present

## 2020-10-06 DIAGNOSIS — F419 Anxiety disorder, unspecified: Secondary | ICD-10-CM | POA: Diagnosis present

## 2020-10-06 DIAGNOSIS — J449 Chronic obstructive pulmonary disease, unspecified: Secondary | ICD-10-CM | POA: Diagnosis not present

## 2020-10-06 DIAGNOSIS — I132 Hypertensive heart and chronic kidney disease with heart failure and with stage 5 chronic kidney disease, or end stage renal disease: Secondary | ICD-10-CM | POA: Diagnosis present

## 2020-10-06 DIAGNOSIS — E1140 Type 2 diabetes mellitus with diabetic neuropathy, unspecified: Secondary | ICD-10-CM | POA: Diagnosis present

## 2020-10-06 DIAGNOSIS — F3341 Major depressive disorder, recurrent, in partial remission: Secondary | ICD-10-CM | POA: Diagnosis present

## 2020-10-06 DIAGNOSIS — H409 Unspecified glaucoma: Secondary | ICD-10-CM | POA: Diagnosis present

## 2020-10-06 DIAGNOSIS — N186 End stage renal disease: Secondary | ICD-10-CM | POA: Diagnosis not present

## 2020-10-06 DIAGNOSIS — E8889 Other specified metabolic disorders: Secondary | ICD-10-CM | POA: Diagnosis present

## 2020-10-06 DIAGNOSIS — E1122 Type 2 diabetes mellitus with diabetic chronic kidney disease: Secondary | ICD-10-CM | POA: Diagnosis present

## 2020-10-06 DIAGNOSIS — E782 Mixed hyperlipidemia: Secondary | ICD-10-CM | POA: Diagnosis present

## 2020-10-06 DIAGNOSIS — K219 Gastro-esophageal reflux disease without esophagitis: Secondary | ICD-10-CM | POA: Diagnosis present

## 2020-10-06 DIAGNOSIS — H548 Legal blindness, as defined in USA: Secondary | ICD-10-CM | POA: Diagnosis present

## 2020-10-06 DIAGNOSIS — K7460 Unspecified cirrhosis of liver: Secondary | ICD-10-CM | POA: Diagnosis present

## 2020-10-06 DIAGNOSIS — D631 Anemia in chronic kidney disease: Secondary | ICD-10-CM | POA: Diagnosis not present

## 2020-10-06 DIAGNOSIS — Z825 Family history of asthma and other chronic lower respiratory diseases: Secondary | ICD-10-CM | POA: Diagnosis not present

## 2020-10-06 DIAGNOSIS — J9601 Acute respiratory failure with hypoxia: Secondary | ICD-10-CM | POA: Diagnosis present

## 2020-10-06 DIAGNOSIS — Z20822 Contact with and (suspected) exposure to covid-19: Secondary | ICD-10-CM | POA: Diagnosis present

## 2020-10-06 DIAGNOSIS — K760 Fatty (change of) liver, not elsewhere classified: Secondary | ICD-10-CM | POA: Diagnosis present

## 2020-10-06 DIAGNOSIS — K7400 Hepatic fibrosis, unspecified: Secondary | ICD-10-CM | POA: Diagnosis present

## 2020-10-06 DIAGNOSIS — N250 Renal osteodystrophy: Secondary | ICD-10-CM | POA: Diagnosis not present

## 2020-10-06 DIAGNOSIS — E113511 Type 2 diabetes mellitus with proliferative diabetic retinopathy with macular edema, right eye: Secondary | ICD-10-CM | POA: Diagnosis present

## 2020-10-06 DIAGNOSIS — Z9049 Acquired absence of other specified parts of digestive tract: Secondary | ICD-10-CM | POA: Diagnosis not present

## 2020-10-06 DIAGNOSIS — J441 Chronic obstructive pulmonary disease with (acute) exacerbation: Secondary | ICD-10-CM | POA: Diagnosis not present

## 2020-10-06 DIAGNOSIS — F17210 Nicotine dependence, cigarettes, uncomplicated: Secondary | ICD-10-CM | POA: Diagnosis present

## 2020-10-06 DIAGNOSIS — I5032 Chronic diastolic (congestive) heart failure: Secondary | ICD-10-CM | POA: Diagnosis present

## 2020-10-06 LAB — CBC
HCT: 25.7 % — ABNORMAL LOW (ref 36.0–46.0)
Hemoglobin: 7.8 g/dL — ABNORMAL LOW (ref 12.0–15.0)
MCH: 28.6 pg (ref 26.0–34.0)
MCHC: 30.4 g/dL (ref 30.0–36.0)
MCV: 94.1 fL (ref 80.0–100.0)
Platelets: 139 10*3/uL — ABNORMAL LOW (ref 150–400)
RBC: 2.73 MIL/uL — ABNORMAL LOW (ref 3.87–5.11)
RDW: 14.7 % (ref 11.5–15.5)
WBC: 3.9 10*3/uL — ABNORMAL LOW (ref 4.0–10.5)
nRBC: 0 % (ref 0.0–0.2)

## 2020-10-06 LAB — RENAL FUNCTION PANEL
Albumin: 3.3 g/dL — ABNORMAL LOW (ref 3.5–5.0)
Anion gap: 12 (ref 5–15)
BUN: 44 mg/dL — ABNORMAL HIGH (ref 6–20)
CO2: 24 mmol/L (ref 22–32)
Calcium: 8.5 mg/dL — ABNORMAL LOW (ref 8.9–10.3)
Chloride: 97 mmol/L — ABNORMAL LOW (ref 98–111)
Creatinine, Ser: 9.03 mg/dL — ABNORMAL HIGH (ref 0.44–1.00)
GFR, Estimated: 5 mL/min — ABNORMAL LOW (ref >60)
Glucose, Bld: 297 mg/dL — ABNORMAL HIGH (ref 70–99)
Phosphorus: 7.2 mg/dL — ABNORMAL HIGH (ref 2.5–4.6)
Potassium: 4.9 mmol/L (ref 3.5–5.1)
Sodium: 133 mmol/L — ABNORMAL LOW (ref 135–145)

## 2020-10-06 LAB — GLUCOSE, CAPILLARY
Glucose-Capillary: 227 mg/dL — ABNORMAL HIGH (ref 70–99)
Glucose-Capillary: 277 mg/dL — ABNORMAL HIGH (ref 70–99)
Glucose-Capillary: 190 mg/dL — ABNORMAL HIGH (ref 70–99)
Glucose-Capillary: 176 mg/dL — ABNORMAL HIGH (ref 70–99)

## 2020-10-06 LAB — HIV ANTIBODY (ROUTINE TESTING W REFLEX): HIV Screen 4th Generation wRfx: NONREACTIVE

## 2020-10-06 LAB — MRSA PCR SCREENING: MRSA by PCR: POSITIVE — AB

## 2020-10-06 MED ORDER — LIDOCAINE HCL (PF) 1 % IJ SOLN: 5.0000 mL | INTRAMUSCULAR | Status: DC | PRN

## 2020-10-06 MED ORDER — SODIUM CHLORIDE 0.9 % IV SOLN: 100.0000 mL | INTRAVENOUS | Status: DC | PRN

## 2020-10-06 MED ORDER — INSULIN GLARGINE 100 UNIT/ML ~~LOC~~ SOLN
12.0000 [IU] | Freq: Once | SUBCUTANEOUS | Status: AC
  Administered 2020-10-06: 12 [IU] via SUBCUTANEOUS
  Filled 2020-10-06: qty 0.12

## 2020-10-06 MED ORDER — METHYLPREDNISOLONE SODIUM SUCC 40 MG IJ SOLR
40.0000 mg | Freq: Three times a day (TID) | INTRAMUSCULAR | Status: DC
  Administered 2020-10-06 – 2020-10-07 (×3): 40 mg via INTRAVENOUS
  Filled 2020-10-06 (×3): qty 1

## 2020-10-06 MED ORDER — LIDOCAINE-PRILOCAINE 2.5-2.5 % EX CREA: 1.0000 "application " | TOPICAL_CREAM | CUTANEOUS | Status: DC | PRN

## 2020-10-06 MED ORDER — CHLORHEXIDINE GLUCONATE CLOTH 2 % EX PADS
6.0000 | MEDICATED_PAD | Freq: Every day | CUTANEOUS | Status: DC
  Administered 2020-10-07: 6 via TOPICAL

## 2020-10-06 MED ORDER — ALBUTEROL SULFATE (2.5 MG/3ML) 0.083% IN NEBU: 2.5000 mg | INHALATION_SOLUTION | RESPIRATORY_TRACT | Status: DC | PRN

## 2020-10-06 MED ORDER — LABETALOL HCL 5 MG/ML IV SOLN
10.0000 mg | INTRAVENOUS | Status: DC | PRN
  Administered 2020-10-06: 10 mg via INTRAVENOUS
  Filled 2020-10-06: qty 4

## 2020-10-06 MED ORDER — PENTAFLUOROPROP-TETRAFLUOROETH EX AERO: 1.0000 "application " | INHALATION_SPRAY | CUTANEOUS | Status: DC | PRN

## 2020-10-06 MED ORDER — NICOTINE 14 MG/24HR TD PT24
14.0000 mg | MEDICATED_PATCH | Freq: Every day | TRANSDERMAL | Status: DC
  Administered 2020-10-06 – 2020-10-07 (×2): 14 mg via TRANSDERMAL
  Filled 2020-10-06 (×2): qty 1

## 2020-10-06 NOTE — Procedures (Signed)
   HEMODIALYSIS TREATMENT NOTE:  3.25 hour heparin-free HD completed via LUE AVG (15g ante/retrograde).  2.5 liters removed (1L challenge for HTN).  BP remained high throughout session; she states, "It always starts high but comes down with dialysis."  D/w Dr. Maurene Capes - Labetolol 25m given IVP at 1830 with no effect.  Per Dr. EDenton Brick reassess at 2100.  Primary nurse is aware.     ARockwell Alexandria RN

## 2020-10-06 NOTE — Progress Notes (Signed)
Pt receiving Dialysis BP ha remained elevated through out. 42m of labetalol given by dialysis nurse.Bp till elevated MD paged Aminon.

## 2020-10-06 NOTE — Progress Notes (Signed)
Patient Demographics:    Kara Knapp, is a 50 y.o. female, DOB - 23-Dec-1970, PQZ:[id number]  Admit date - 10/05/2020   Admitting Physician Courage Denton Brick, MD  Outpatient Primary MD for the patient is Medicine, [facility]  LOS - 0   Chief Complaint  Patient presents with  . Shortness of Breath        Subjective:    Kara Knapp today has no fevers, no emesis,  No chest pain,   Cough and shortness of breath persist -Hypoxia persist  Assessment  & Plan :    Principal Problem:   COPD with acute exacerbation (HCC) Active Problems:   Acute respiratory failure with hypoxia (HCC)   ESRD on dialysis ([place])   Essential hypertension, benign   Chronic diastolic CHF (congestive heart failure) (HCC)   Type 2 diabetes mellitus with ESRD (end-stage renal disease) ([place])   Legally blind   MDD (major depressive disorder), recurrent episode, moderate (HCC)   Hepatic cirrhosis (HCC)  Brief Summary:- 50 y.o. female with medical history significant for COPD, ESRD, diastolic CHF, hypertension, liver cirrhosis, diabetes with neuropathy, legally blind admitted on 10/05/2020 with acute hypoxic respiratory failure in the setting of ongoing tobacco use and COPD exacerbation  A/p 1)Acute exacerbation of COPD--- cough shortness of breath and hypoxia persist -Patient with symptomatic dyspnea on exertion -Continue IV Solu-Medrol, mucolytics, bronchodilators, doxycycline and supplemental oxygen  2)Tobacco abuse--- smoking cessation advised, okay to use nicotine patch  3)HTN-stage II, BP is not at goal, continue Coreg, may use IV labetalol as needed elevated BP  4)ESRD-plans for HD today per patient's usual schedule -Nephrology consult appreciated  5)HFpEF-history of chronic diastolic dysfunction CHF appears compensated at this time, -Continue to use hemodialysis to address volume status, last known EF 55 to 50%  with echo showing grade 2 diastolic dysfunction back in 2018  6)DM2- .  Previously uncontrolled with hyperglycemia, no recent A1c available -Continue Lantus insulin, hold Tradjenta and Use Novolog/Humalog Sliding scale insulin with Accu-Cheks/Fingersticks as ordered    Disposition/Need for in-Hospital Stay- patient unable to be discharged at this time due to -acute hypoxic respiratory failure in the setting of COPD exacerbation currently requiring IV steroids and supplemental oxygen*  Status is: Inpatient  Remains inpatient appropriate because:Please see above   Disposition: The patient is from: Home              Anticipated d/c is to: Home              Anticipated d/c date is: 1 day              Patient currently is not medically stable to d/c. Barriers: Not Clinically Stable-   Code Status :  -  Code Status: Full Code   Family Communication:    NA (patient is alert, awake and coherent)   Consults  :  Nephrology  DVT Prophylaxis  :   - SCDs   heparin injection 5,000 Units Start: 10/05/20 2200    Lab Results  Component Value Date   PLT 139 (L) 10/06/2020    Inpatient Medications  Scheduled Meds: . ARIPiprazole  5 mg Oral Daily  . carvedilol  12.5 mg Oral BID  . Chlorhexidine Gluconate Cloth  6 each Topical Daily  . [  START ON 10/07/2020] Chlorhexidine Gluconate Cloth  6 each Topical Q0600  . doxycycline  100 mg Oral Q12H  . gabapentin  100 mg Oral TID  . guaiFENesin-dextromethorphan  10 mL Oral Q8H  . heparin  5,000 Units Subcutaneous Q8H  . insulin aspart  0-5 Units Subcutaneous QHS  . insulin aspart  0-9 Units Subcutaneous TID WC  . insulin glargine  20 Units Subcutaneous QHS  . ipratropium-albuterol  3 mL Nebulization Q6H  . methylPREDNISolone (SOLU-MEDROL) injection  40 mg Intravenous Q8H  . sertraline  150 mg Oral Daily   Continuous Infusions: . sodium chloride    . sodium chloride     PRN Meds:.sodium chloride, sodium chloride, acetaminophen **OR**  acetaminophen, albuterol, ALPRAZolam, labetalol, lidocaine (PF), lidocaine-prilocaine, pentafluoroprop-tetrafluoroeth, polyethylene glycol    Anti-infectives (From admission, onward)   Start     Dose/Rate Route Frequency Ordered Stop   10/05/20 2200  doxycycline (VIBRA-TABS) tablet 100 mg        100 mg Oral Every 12 hours 10/05/20 2105 10/10/20 2159        Objective:   Vitals:   10/06/20 0253 10/06/20 0523 10/06/20 1336 10/06/20 1345  BP:  (!) 157/73  (!) 185/109  Pulse:  68  68  Resp:  19  20  Temp:  97.9 F (36.6 C)  98.4 F (36.9 C)  TempSrc:      SpO2: 94% 98% 96% 98%  Weight:      Height:        Wt Readings from Last 3 Encounters:  10/05/20 85.3 kg  05/30/20 88.6 kg  04/29/20 86.6 kg     Intake/Output Summary (Last 24 hours) at 10/06/2020 1553 Last data filed at 10/06/2020 1300 Gross per 24 hour  Intake 477 ml  Output 950 ml  Net -473 ml     Physical Exam  Gen:- Awake Alert, dyspnea on exertion HEENT:- [place].AT, legally blind Nose- [place] 2L/min  Neck-Supple Neck,No JVD,.  Lungs-diminished breath sounds, few scattered wheezes  CV- S1, S2 normal, regular  Abd-  +ve B.Sounds, Abd Soft, No tenderness,    Extremity/Skin:- No  edema, pedal pulses present  Psych-affect is appropriate, oriented x3 Neuro-no new focal deficits, no tremors MSK-left upper extremity AV fistula with bruit and thrill   Data Review:   Micro Results Recent Results (from the past 240 hour(s))  Resp Panel by RT-PCR (Flu A&B, Covid) Nasopharyngeal Swab     Status: None   Collection Time: 10/05/20  1:48 PM   Specimen: Nasopharyngeal Swab; Nasopharyngeal(NP) swabs in vial transport medium  Result Value Ref Range Status   SARS Coronavirus 2 by RT PCR NEGATIVE NEGATIVE Final    Comment: (NOTE) SARS-CoV-2 target nucleic acids are NOT DETECTED.  The SARS-CoV-2 RNA is generally detectable in upper respiratory specimens during the acute phase of infection. The lowest concentration of  SARS-CoV-2 viral copies this assay can detect is 138 copies/mL. A negative result does not preclude SARS-Cov-2 infection and should not be used as the sole basis for treatment or other patient management decisions. A negative result may occur with  improper specimen collection/handling, submission of specimen other than nasopharyngeal swab, presence of viral mutation(s) within the areas targeted by this assay, and inadequate number of viral copies(<138 copies/mL). A negative result must be combined with clinical observations, patient history, and epidemiological information. The expected result is Negative.  Fact Sheet for Patients:  EntrepreneurPulse.com.au  Fact Sheet for Healthcare Providers:  IncredibleEmployment.be  This test is no t yet approved or  cleared by the [place] and  has been authorized for detection and/or diagnosis of SARS-CoV-2 by FDA under an Emergency Use Authorization (EUA). This EUA will remain  in effect (meaning this test can be used) for the duration of the COVID-19 declaration under Section 564(b)(1) of the Act, 21 U.S.C.section 360bbb-3(b)(1), unless the authorization is terminated  or revoked sooner.       Influenza A by PCR NEGATIVE NEGATIVE Final   Influenza B by PCR NEGATIVE NEGATIVE Final    Comment: (NOTE) The Xpert Xpress SARS-CoV-2/FLU/RSV plus assay is intended as an aid in the diagnosis of influenza from Nasopharyngeal swab specimens and should not be used as a sole basis for treatment. Nasal washings and aspirates are unacceptable for Xpert Xpress SARS-CoV-2/FLU/RSV testing.  Fact Sheet for Patients: EntrepreneurPulse.com.au  Fact Sheet for Healthcare Providers: IncredibleEmployment.be  This test is not yet approved or cleared by the [place] FDA and has been authorized for detection and/or diagnosis of SARS-CoV-2 by FDA under an Emergency Use  Authorization (EUA). This EUA will remain in effect (meaning this test can be used) for the duration of the COVID-19 declaration under Section 564(b)(1) of the Act, 21 U.S.C. section 360bbb-3(b)(1), unless the authorization is terminated or revoked.  Performed at [facility], [address] [place], [place] [address]   MRSA PCR Screening     Status: Abnormal   Collection Time: 10/06/20  2:28 AM   Specimen: Nasopharyngeal  Result Value Ref Range Status   MRSA by PCR POSITIVE (A) NEGATIVE Final    Comment:        The GeneXpert MRSA Assay (FDA approved for NASAL specimens only), is one component of a comprehensive MRSA colonization surveillance program. It is not intended to diagnose MRSA infection nor to guide or monitor treatment for MRSA infections. RESULT CALLED TO, READ BACK BY AND VERIFIED WITH: SCHWARTZ,B @0922  10/06/20 BY BEARD,S Performed at [facility], [address] [place], [place] [address]     Radiology Reports DG Chest [place] 1 View  Result Date: 10/05/2020 CLINICAL DATA:  Shortness of breath for the past 3 days. EXAM: PORTABLE CHEST 1 VIEW COMPARISON:  Chest x-ray dated June 13, 2019. CT chest dated April 02, 2018. FINDINGS: The heart size and mediastinal contours are within normal limits. Both lungs are clear. The visualized skeletal structures are unremarkable. Partially visualized vascular stent in the right upper arm. IMPRESSION: 1. No active disease. Electronically Signed   By: Titus Dubin M.D.   On: 10/05/2020 14:21     CBC Recent Labs  Lab 10/05/20 1339 10/06/20 1255  WBC 3.2* 3.9*  HGB 8.2* 7.8*  HCT 26.8* 25.7*  PLT 139* 139*  MCV 95.4 94.1  MCH 29.2 28.6  MCHC 30.6 30.4  RDW 15.0 14.7  LYMPHSABS 0.7  --   MONOABS 0.6  --   EOSABS 0.0  --   BASOSABS 0.0  --     Chemistries  Recent Labs  Lab 10/05/20 1339 10/06/20 1255  NA 136 133*  K 4.1 4.9  CL 98 97*  CO2 26 24  GLUCOSE 115* 297*  BUN 30* 44*  CREATININE 7.32* 9.03*   CALCIUM 8.8* 8.5*  AST 23  --   ALT 25  --   ALKPHOS 84  --   BILITOT 0.6  --    ------------------------------------------------------------------------------------------------------------------ No results for input(s): CHOL, HDL, LDLCALC, TRIG, CHOLHDL, LDLDIRECT in the last 72 hours.  Lab Results  Component Value Date   HGBA1C 7.2 (H)  08/29/2018   ------------------------------------------------------------------------------------------------------------------ No results for input(s): TSH, T4TOTAL, T3FREE, THYROIDAB in the last 72 hours.  Invalid input(s): FREET3 ------------------------------------------------------------------------------------------------------------------ No results for input(s): VITAMINB12, FOLATE, FERRITIN, TIBC, IRON, RETICCTPCT in the last 72 hours.  Coagulation profile No results for input(s): INR, PROTIME in the last 168 hours.  No results for input(s): DDIMER in the last 72 hours.  Cardiac Enzymes No results for input(s): CKMB, TROPONINI, MYOGLOBIN in the last 168 hours.  Invalid input(s): CK ------------------------------------------------------------------------------------------------------------------    Component Value Date/Time   BNP 675.0 (H) 07/27/2015 1020     Roxan Hockey M.D on 10/06/2020 at 3:53 PM  Go to [email or web address] - for contact info  [facility] - Office  [phone]

## 2020-10-06 NOTE — Progress Notes (Signed)
Courage MD responded to amion. MD said give until 20:00pm and reassess BP then

## 2020-10-06 NOTE — Progress Notes (Signed)
SATURATION QUALIFICATIONS: (This note is used to comply with regulatory documentation for home oxygen)  Patient Saturations on Room Air at Rest: 91%  Patient Saturations on Room Air while Ambulating = 87%  Patient Saturations on 2 Liters of oxygen while Ambulating = 93  Please briefly explain why patient needs home oxygen: While Patient ambulates her o2 drop below 90%

## 2020-10-06 NOTE — Care Management Obs Status (Signed)
[facility] NOTIFICATION   Patient Details  Name: Kara Knapp MRN: [id number] Date of Birth: 04-26-1971   Medicare Observation Status Notification Given:  Yes    Tommy Medal 10/06/2020, 3:56 PM

## 2020-10-06 NOTE — TOC Transition Note (Signed)
Transition of Care [facility]) - CM/SW Discharge Note   Patient Details  Name: Kara Knapp MRN: [id number] Date of Birth: August 16, 1970  [facility]) CM/SW Contact:  Boneta Lucks, RN Phone Number: 10/06/2020, 11:43 AM   Clinical Narrative:   Patient admitted in observation for COPD, discharging home requiring home oxygen. Barbaraann Rondo with [employer] is on site. MD will order and Barbaraann Rondo will deliver to the room.     Final next level of care: Home/Self Care Barriers to Discharge: Barriers Resolved   Patient Goals and CMS Choice Patient states their goals for this hospitalization and ongoing recovery are:: to go home. CMS Medicare.gov Compare Post Acute Care list provided to:: Patient Choice offered to / list presented to : Patient  Discharge Placement             Patient and family notified of of transfer: 10/06/20  Discharge Plan and Services               DME Arranged: Oxygen DME Agency: [employer] Date DME Agency Contacted: 10/06/20 Time DME Agency Contacted: 1112 Representative spoke with at DME Agency: Barbaraann Rondo

## 2020-10-07 DIAGNOSIS — N250 Renal osteodystrophy: Secondary | ICD-10-CM | POA: Diagnosis not present

## 2020-10-07 DIAGNOSIS — J449 Chronic obstructive pulmonary disease, unspecified: Secondary | ICD-10-CM | POA: Diagnosis not present

## 2020-10-07 DIAGNOSIS — N186 End stage renal disease: Secondary | ICD-10-CM | POA: Diagnosis not present

## 2020-10-07 DIAGNOSIS — Z992 Dependence on renal dialysis: Secondary | ICD-10-CM | POA: Diagnosis not present

## 2020-10-07 DIAGNOSIS — D631 Anemia in chronic kidney disease: Secondary | ICD-10-CM | POA: Diagnosis not present

## 2020-10-07 DIAGNOSIS — I120 Hypertensive chronic kidney disease with stage 5 chronic kidney disease or end stage renal disease: Secondary | ICD-10-CM | POA: Diagnosis not present

## 2020-10-07 LAB — GLUCOSE, CAPILLARY
Glucose-Capillary: 287 mg/dL — ABNORMAL HIGH (ref 70–99)
Glucose-Capillary: 238 mg/dL — ABNORMAL HIGH (ref 70–99)

## 2020-10-07 MED ORDER — NICOTINE 21 MG/24HR TD PT24: 21.0000 mg | MEDICATED_PATCH | TRANSDERMAL | 0 refills | Status: AC

## 2020-10-07 MED ORDER — VENTOLIN HFA 108 (90 BASE) MCG/ACT IN AERS
2.0000 | INHALATION_SPRAY | RESPIRATORY_TRACT | 2 refills | Status: AC | PRN
Start: 2020-10-07 — End: ?

## 2020-10-07 MED ORDER — SYMBICORT 160-4.5 MCG/ACT IN AERO: 2.0000 | INHALATION_SPRAY | Freq: Two times a day (BID) | RESPIRATORY_TRACT | 2 refills | Status: DC

## 2020-10-07 MED ORDER — DEXAMETHASONE 4 MG PO TABS: 4.0000 mg | ORAL_TABLET | Freq: Every day | ORAL | 0 refills | Status: DC

## 2020-10-07 MED ORDER — DOXYCYCLINE HYCLATE 100 MG PO TABS: 100.0000 mg | ORAL_TABLET | Freq: Two times a day (BID) | ORAL | 0 refills | Status: DC

## 2020-10-07 MED ORDER — CARVEDILOL 12.5 MG PO TABS: 12.5000 mg | ORAL_TABLET | Freq: Two times a day (BID) | ORAL | 5 refills | Status: AC

## 2020-10-07 MED ORDER — HYDRALAZINE HCL 100 MG PO TABS
50.0000 mg | ORAL_TABLET | Freq: Two times a day (BID) | ORAL | 5 refills | Status: DC
Start: 2020-10-07 — End: 2020-10-07

## 2020-10-07 MED ORDER — HYDRALAZINE HCL 50 MG PO TABS: 50.0000 mg | ORAL_TABLET | Freq: Two times a day (BID) | ORAL | 3 refills | Status: AC

## 2020-10-07 NOTE — Progress Notes (Signed)
  [facility] Progress Note   Assessment/ Plan:    Orders- partial [facility] TTS 3 hrs 45 min BFR 350/ DFR 500 2K/ 2.25 Ca bath AVG    1. COPD exacerbation: received nebs and solumedrol.  Going home on home O2 2.ESRD TTS [facility].  Would use 85 kg for her new EDW and challenge from there as possible 3. Anemia: ESA as OP 4. CKD-MBD: binders and vitamins  5. Nutrition: albumin 3.3, recommend ONSP as OP 6. Hypertension: expect to improve with increased UF 7.  Dispo: OK to d/c from renal perspective  Subjective:    Seen in room.  Briefly, 76F with ESRD on HD TTS at [facility] who was admitted for COPD exacerbation.  She received dialysis yesterday with 2.5L off, challenged for HTN.  She is feeling well today and is eager to go home.  She says that she has been getting under her EDW at OP dialysis.  BP is high.     Objective:   BP (!) 164/79 (BP Location: Right Arm)   Pulse 67   Temp 98.6 F (37 C) (Oral)   Resp 20   Ht 5' 3"  (1.6 m)   Wt 86.7 kg   LMP 05/05/2015   SpO2 93%   BMI 33.86 kg/m   Physical Exam: Gen: NAD, sitting in bed CVS: RRR Resp: no crackles Abd: soft nontender Ext: no LE edema ACCESS: AVG + T/B  Labs: BMET Recent Labs  Lab 10/05/20 1339 10/06/20 1255  NA 136 133*  K 4.1 4.9  CL 98 97*  CO2 26 24  GLUCOSE 115* 297*  BUN 30* 44*  CREATININE 7.32* 9.03*  CALCIUM 8.8* 8.5*  PHOS  --  7.2*   CBC Recent Labs  Lab 10/05/20 1339 10/06/20 1255  WBC 3.2* 3.9*  NEUTROABS 1.8  --   HGB 8.2* 7.8*  HCT 26.8* 25.7*  MCV 95.4 94.1  PLT 139* 139*      Medications:    . ARIPiprazole  5 mg Oral Daily  . carvedilol  12.5 mg Oral BID  . Chlorhexidine Gluconate Cloth  6 each Topical Daily  . Chlorhexidine Gluconate Cloth  6 each Topical Q0600  . doxycycline  100 mg Oral Q12H  . gabapentin  100 mg Oral TID  . heparin  5,000 Units Subcutaneous Q8H  . insulin aspart  0-5 Units Subcutaneous QHS  . insulin aspart  0-9 Units  Subcutaneous TID WC  . insulin glargine  20 Units Subcutaneous QHS  . methylPREDNISolone (SOLU-MEDROL) injection  40 mg Intravenous Q8H  . nicotine  14 mg Transdermal Daily  . sertraline  150 mg Oral Daily     Madelon Lips, MD 10/07/2020, 9:38 AM

## 2020-10-07 NOTE — Discharge Summary (Signed)
Kara Knapp, is a 50 y.o. female  DOB 22-May-1971  MRN [id number].  Admission date:  10/05/2020  Admitting Physician  Roxan Hockey, MD  Discharge Date:  10/07/2020   Primary MD  Medicine, [facility] Internal  Recommendations for primary care physician for things to follow:   1)you need oxygen at home at 2 L via nasal cannula continuously while awake and while asleep--- smoking or having open fires around oxygen can cause fire, significant injury and death  2) continue hemodialysis per usual schedule on Tuesdays times days and Saturdays, please avoid excessive salt and fluid intake  3)Smoking cessation strongly advised--- may use over-the-counter nicotine patch  Admission Diagnosis  COPD exacerbation ([place]) [J44.1] COPD with acute exacerbation ([place]) [J44.1]   Discharge Diagnosis  COPD exacerbation ([place]) [J44.1] COPD with acute exacerbation ([place]) [J44.1]    Principal Problem:   COPD with acute exacerbation ([place]) Active Problems:   Acute respiratory failure with hypoxia ([place])   ESRD on dialysis ([place])   Essential hypertension, benign   Chronic diastolic CHF (congestive heart failure) ([place])   Type 2 diabetes mellitus with ESRD (end-stage renal disease) ([place])   Legally blind   MDD (major depressive disorder), recurrent episode, moderate ([place])   Hepatic cirrhosis ([place])      Past Medical History:  Diagnosis Date  . Anemia of chronic disease   . Anxiety   . Asthma   . Blind left eye   . Bronchitis   . Cataract   . Cholecystitis, acute 05/26/2013   Status post cholecystectomy  . Chronic abdominal pain   . Chronic diarrhea   . COPD (chronic obstructive pulmonary disease) ([place])   . Depression   . Diabetic foot ulcer ([place]) 03/01/2015  . Diastolic heart failure ([place])   . ESRD on hemodialysis ([place])    Started diaylsis 12/29/15  . Essential hypertension   . Fibroids   . Glaucoma   . History of  blood transfusion   . History of pneumonia   . Hyperlipidemia   . Insulin-dependent diabetes mellitus with retinopathy   . Liver fibrosis    Negative Hep B surface antigen, negative Hep C antibody Feb 2018 (see scanned in labs).  . Neuropathy   . Osteomyelitis ([place])    Toe on left foot    Past Surgical History:  Procedure Laterality Date  . A/V SHUNTOGRAM N/A 10/25/2016   Procedure: A/V Shuntogram - Right Arm;  Surgeon: Waynetta Sandy, MD;  Location: [facility];  Service: Cardiovascular;  Laterality: N/A;  . A/V SHUNTOGRAM N/A 03/05/2018   Procedure: A/V SHUNTOGRAM - Right Arm;  Surgeon: Waynetta Sandy, MD;  Location: [place];  Service: Cardiovascular;  Laterality: N/A;  . AV FISTULA PLACEMENT Right 10/17/2015   Procedure: INSERTION OF ARTERIOVENOUS GORE-TEX GRAFT RIGHT UPPER ARM WITH ACUSEAL;  Surgeon: Conrad Intercourse, MD;  Location: [facility];  Service: Vascular;  Laterality: Right;  . AV FISTULA PLACEMENT Left 11/18/2019   Procedure: INSERTION OF ARTERIOVENOUS (AV) GORE-TEX GRAFT left  ARM;  Surgeon: Serafina Mitchell, MD;  Location[facility];  Service: Vascular;  Laterality: Left;  . CATARACT EXTRACTION W/ INTRAOCULAR LENS IMPLANT Bilateral   . CESAREAN SECTION    . CHOLECYSTECTOMY N/A 05/25/2013   Procedure: LAPAROSCOPIC CHOLECYSTECTOMY;  Surgeon: Jamesetta So, MD;  Location: [facility];  Service: General;  Laterality: N/A;  . COLONOSCOPY WITH PROPOFOL N/A 10/02/2016   two 3 to 5 mm polyps in the descending colon, three 2 to 3 mm polyps in the rectum, random colon biopsies, rectal bleeding due to internal hemorrhoids, friability with no bleeding at the anus status post biopsy.  Surgical pathology found the polyps to be a mix of hyperplastic and tubular adenoma, random colon biopsies to be benign colonic mucosa, and the anal biopsies to be anal skin tag.   Marland Kitchen ESOPHAGOGASTRODUODENOSCOPY (EGD) WITH PROPOFOL N/A 10/02/2016   mucosal nodule in the esophagus status post  biopsy, moderate gastritis status post biopsy, mild duodenitis. esophageal biopsy to be benign, gastric biopsies to be gastritis due to aspirin use, and duodenal biopsies to be duodenitis due to aspirin use  . EYE SURGERY Bilateral   . IR FLUORO GUIDE CV LINE RIGHT  10/09/2019  . IR REMOVAL TUN CV CATH W/O FL  01/15/2020  . IR THROMBECTOMY AV FISTULA W/THROMBOLYSIS/PTA INC/SHUNT/IMG RIGHT Right 12/26/2018  . IR THROMBECTOMY AV FISTULA W/THROMBOLYSIS/PTA INC/SHUNT/IMG RIGHT Right 08/10/2019  . IR THROMBECTOMY AV FISTULA W/THROMBOLYSIS/PTA/STENT INC/SHUNT/IMG RT Right 08/08/2018  . IR THROMBECTOMY AV FISTULA W/THROMBOLYSIS/PTA/STENT INC/SHUNT/IMG RT Right 05/15/2019  . IR US GUIDE VASC ACCESS RIGHT  08/08/2018  . IR US GUIDE VASC ACCESS RIGHT  12/26/2018  . IR US GUIDE VASC ACCESS RIGHT  05/15/2019  . IR US GUIDE VASC ACCESS RIGHT  08/10/2019  . IR US GUIDE VASC ACCESS RIGHT  10/09/2019  . PARS PLANA VITRECTOMY Left 11/24/2014   Procedure: PARS PLANA VITRECTOMY WITH 25 GAUGE;  Surgeon: Hurman Horn, MD;  Location: [facility];  Service: Ophthalmology;  Laterality: Left;  . PERIPHERAL VASCULAR BALLOON ANGIOPLASTY Right 03/05/2018   Procedure: PERIPHERAL VASCULAR BALLOON ANGIOPLASTY;  Surgeon: Waynetta Sandy, MD;  Location: [place];  Service: Cardiovascular;  Laterality: Right;  arm fistula  . PERIPHERAL VASCULAR CATHETERIZATION N/A 04/28/2015   Procedure: Bilateral Upper Extremity Venography;  Surgeon: Conrad [place], MD;  Location: [facility] [facility];  Service: Cardiovascular;  Laterality: N/A;  . PHOTOCOAGULATION WITH LASER Left 11/24/2014   Procedure: PHOTOCOAGULATION WITH LASER;  Surgeon: Hurman Horn, MD;  Location: [facility];  Service: Ophthalmology;  Laterality: Left;  with insertion of silicone oil  . SAVORY DILATION N/A 10/02/2016   Procedure: SAVORY DILATION;  Surgeon: Danie Binder, MD;  [facility];  Service: Endoscopy;  Laterality: N/A;  . TUBAL LIGATION    . UPPER  EXTREMITY VENOGRAPHY Bilateral 11/02/2019   Procedure: UPPER EXTREMITY VENOGRAPHY;  Surgeon: Waynetta Sandy, MD;  Location: [facility] [place] [facility];  Service: Cardiovascular;  Laterality: Bilateral;     HPI  from the history and physical done on the day of admission:     Chief Complaint: Difficulty breathing  HPI: Kara Knapp is a 50 y.o. female with medical history significant for COPD, ESRD, diastolic CHF, hypertension, liver cirrhosis, diabetes with neuropathy, legally blind. Patient presented to the ED with complaints of difficulty breathing of 2 days duration.  She reports dry cough also continuous wheezing.  No chest pain.  No leg swelling.  No recent trips.  She is  not on home O2.  ED Course: Temp 98.1, O2 sats on room air 95% but drops to 86 patients with ambulation.  Respiratory rate 16-27.  COVID test negative.  Chest x-ray without active disease.  125 Solu-Medrol given, albuterol nebulizer given .  Hospitalist to admit for COPD with acute respiratory failure.  Review of Systems: As per HPI all other systems reviewed and negative.     Hospital Course:    -Brief Summary:- 50 y.o.femalewith medical history significant forCOPD, ESRD, diastolic CHF, hypertension, liver cirrhosis, diabetes with neuropathy, legally blind admitted on 10/05/2020 with acute hypoxic respiratory failure in the setting of ongoing tobacco use and COPD exacerbation  A/p 1)Acute exacerbation of COPD---  -Cough is much better -No shortness of breath at rest, dyspnea on exertion improved significantly -Some hypoxia with ambulation but no significant hypoxia at rest -Treated with IV Solu-Medrol, mucolytics, bronchodilators, doxycycline and supplemental oxygen -Okay to discharge on p.o. prednisone, oral doxycycline and supplemental oxygen along with bronchodilators  2)Tobacco abuse--- smoking cessation advised,  nicotine patch advised  3)HTN-stage II, -continue Coreg, hydralazine added for  better BP control  4)ESRD-tolerated HD well on 10/06/2020 -Nephrology consult appreciated -Continue outpatient HD per TTS schedule  5)HFpEF-history of chronic diastolic dysfunction CHF appears compensated at this time, -Continue to use hemodialysis to address volume status, last known EF 55 to 50% with echo showing grade 2 diastolic dysfunction back in 2018  6)DM2- .  Previously uncontrolled with hyperglycemia, no recent A1c available -Continue Lantus insulin, hold Tradjenta and Use Novolog/Humalog Sliding scale insulin with Accu-Cheks/Fingersticks as ordered    Disposition--- discharge home with home oxygen  Disposition: The patient is from: Home  Anticipated d/c is to: Home with home oxygen  Code Status :  -  Code Status: Full Code   Family Communication:     (patient is alert, awake and coherent)   Consults  :  Nephrology  Discharge Condition: stable  Follow UP   [place], [facility] Internal.   Specialty: Internal Medicine Contact information: [place] [address] [phone]        [facility] Follow up.   Why:  Home Oxygen Contact information: [place] [address] [phone]              Diet and Activity recommendation:  As advised  Discharge Instructions    Discharge Instructions    Call MD for:  difficulty breathing, headache or visual disturbances   Complete by: As directed    Call MD for:  persistant dizziness or light-headedness   Complete by: As directed    Call MD for:  persistant nausea and vomiting   Complete by: As directed    Call MD for:  severe uncontrolled pain   Complete by: As directed    Call MD for:  temperature >100.4   Complete by: As directed    Diet - low sodium heart healthy   Complete by: As directed    Discharge instructions   Complete by: As directed    1)you need oxygen at home at 2 L via nasal cannula continuously while awake  and while asleep--- smoking or having open fires around oxygen can cause fire, significant injury and death  2) continue hemodialysis per usual schedule on Tuesdays times days and Saturdays, please avoid excessive salt and fluid intake  3)Smoking cessation strongly advised--- may use over-the-counter nicotine patch   Increase activity slowly   Complete by: As directed  No wound care   Complete by: As directed         Discharge Medications     Allergies as of 10/07/2020      Reactions   Sulfamethoxazole-trimethoprim Nausea And Vomiting   Bactrim [sulfamethoxazole-trimethoprim] Nausea And Vomiting   Prednisone Other (See Comments)   "I was wide open and couldn't eat" per pt. Loss of appetite and insomnia  LOSS OF APPETITE,UNABLE TO SLEEP      Medication List    STOP taking these medications   diclofenac sodium 1 % Gel Commonly known as: VOLTAREN   ibuprofen 200 MG tablet Commonly known as: ADVIL   levofloxacin 750 MG tablet Commonly known as: LEVAQUIN     TAKE these medications   acetaminophen 500 MG tablet Commonly known as: TYLENOL Take 1,000 mg by mouth every 6 (six) hours as needed for moderate pain or headache.   ARIPiprazole 5 MG tablet Commonly known as: ABILIFY Take 1 tablet (5 mg total) by mouth daily.   azelastine 0.1 % nasal spray Commonly known as: ASTELIN Place 1 spray into both nostrils 2 (two) times daily. Use in each nostril as directed   carvedilol 12.5 MG tablet Commonly known as: COREG Take 1 tablet (12.5 mg total) by mouth 2 (two) times daily.   cholecalciferol 1000 units tablet Commonly known as: VITAMIN D Take 1,000 Units by mouth daily.   Combigan 0.2-0.5 % ophthalmic solution Generic drug: brimonidine-timolol INSTILL ONE DROP IN [facility] EYE TWICE DAILY What changed: See the new instructions.   CULTURELLE PROBIOTICS PO Take 1 capsule by mouth daily.   cyclobenzaprine 5 MG tablet Commonly known as: FLEXERIL TAKE ONE TABLET BY  MOUTH THREE TIMES DAILY AS NEEDED FOR MUSCLE SPASM   dexamethasone 4 MG tablet Commonly known as: DECADRON Take 1 tablet (4 mg total) by mouth daily with breakfast.   doxycycline 100 MG tablet Commonly known as: VIBRA-TABS Take 1 tablet (100 mg total) by mouth 2 (two) times daily.   FreeStyle Libre 14 Day Sensor Misc Inject 1 each into the skin every 14 (fourteen) days. Use as directed.   gabapentin 100 MG capsule Commonly known as: NEURONTIN Take 1 capsule (100 mg total) by mouth 3 (three) times daily. (Needs to be seen before next refill)   hydrALAZINE 50 MG tablet Commonly known as: APRESOLINE Take 1 tablet (50 mg total) by mouth 2 (two) times daily.   Lantus SoloStar 100 UNIT/ML Solostar Pen Generic drug: insulin glargine INJECT 18-50 UNITS SUBCUTANEOUSLY AT BEDTIME. What changed: See the new instructions.   linagliptin 5 MG Tabs tablet Commonly known as: Tradjenta Take 1 tablet (5 mg total) by mouth daily. What changed: when to take this   multivitamin Tabs tablet Take 1 tablet by mouth daily.   nicotine 21 mg/24hr patch Commonly known as: NICODERM CQ - dosed in mg/24 hours Place 1 patch (21 mg total) onto the skin daily for 28 days.   pantoprazole 40 MG tablet Commonly known as: Protonix TAKE 1 BY MOUTH DAILY 30 minutes before breakfast What changed:   how much to take  how to take this  when to take this  additional instructions   Prodigy No Coding Blood Gluc test strip Generic drug: glucose blood USE TO CHECK BLOOD SUGAR TWICE DAILY. Dx E11.22   Prodigy Twist Top Lancets 28G Misc USE TO CHECK BLOOD SUGAR UP TO FOUR TIMES DAILY.   sertraline 100 MG tablet Commonly known as: ZOLOFT Take 1.5 tablets (150 mg total) by mouth daily.  sevelamer carbonate 800 MG tablet Commonly known as: RENVELA Take 800-2,400 mg by mouth See admin instructions. Take 2420m by mouth three times daily with means and 8068mwith snacks   Symbicort 160-4.5 MCG/ACT  inhaler Generic drug: budesonide-formoterol Inhale 2 puffs into the lungs 2 (two) times daily. What changed:   how much to take  how to take this  when to take this   TRUEplus Pen Needles 31G X 5 MM Misc Generic drug: Insulin Pen Needle Use daily with insulin Dx E11.22   Ventolin HFA 108 (90 Base) MCG/ACT inhaler Generic drug: albuterol Inhale 2 puffs into the lungs every 4 (four) hours as needed for wheezing or shortness of breath. What changed:   how much to take  how to take this  when to take this  reasons to take this            Durable Medical Equipment  (From admission, onward)         Start     Ordered   10/06/20 1245  For home use only DME oxygen  Once       Comments: SATURATION QUALIFICATIONS: (This note is used to comply with regulatory documentation for home oxygen)  Patient Saturations on Room Air at Rest: 91%  Patient Saturations on Room Air while Ambulating = 87%  Patient Saturations on 2 Liters of oxygen while Ambulating = 93  Please briefly explain why patient needs home oxygen: While Patient ambulates her o2 drop below 90%  Question Answer Comment  Length of Need Lifetime   Mode or (Route) Nasal cannula   Liters per Minute 2   Frequency Continuous (stationary and portable oxygen unit needed)   Oxygen conserving device Yes   Oxygen delivery system Gas      10/06/20 1244         Major procedures and Radiology Reports - PLEASE review detailed and final reports for all details, in brief -   DG Chest Port 1 View  Result Date: 10/05/2020 CLINICAL DATA:  Shortness of breath for the past 3 days. EXAM: PORTABLE CHEST 1 VIEW COMPARISON:  Chest x-ray dated June 13, 2019. CT chest dated April 02, 2018. FINDINGS: The heart size and mediastinal contours are within normal limits. Both lungs are clear. The visualized skeletal structures are unremarkable. Partially visualized vascular stent in the right upper arm. IMPRESSION: 1. No active  disease. Electronically Signed   By: WiTitus Dubin.D.   On: 10/05/2020 14:21   Micro Results   Recent Results (from the past 240 hour(s))  Resp Panel by RT-PCR (Flu A&B, Covid) Nasopharyngeal Swab     Status: None   Collection Time: 10/05/20  1:48 PM   Specimen: Nasopharyngeal Swab; Nasopharyngeal(NP) swabs in vial transport medium  Result Value Ref Range Status   SARS Coronavirus 2 by RT PCR NEGATIVE NEGATIVE Final    Comment: (NOTE) SARS-CoV-2 target nucleic acids are NOT DETECTED.  The SARS-CoV-2 RNA is generally detectable in upper respiratory specimens during the acute phase of infection. The lowest concentration of SARS-CoV-2 viral copies this assay can detect is 138 copies/mL. A negative result does not preclude SARS-Cov-2 infection and should not be used as the sole basis for treatment or other patient management decisions. A negative result may occur with  improper specimen collection/handling, submission of specimen other than nasopharyngeal swab, presence of viral mutation(s) within the areas targeted by this assay, and inadequate number of viral copies(<138 copies/mL). A negative result must be combined with clinical observations,  patient history, and epidemiological information. The expected result is Negative.  Fact Sheet for Patients:  EntrepreneurPulse.com.au  Fact Sheet for Healthcare Providers:  IncredibleEmployment.be  This test is no t yet approved or cleared by the [place] FDA and  has been authorized for detection and/or diagnosis of SARS-CoV-2 by FDA under an Emergency Use Authorization (EUA). This EUA will remain  in effect (meaning this test can be used) for the duration of the COVID-19 declaration under Section 564(b)(1) of the Act, 21 U.S.C.section 360bbb-3(b)(1), unless the authorization is terminated  or revoked sooner.       Influenza A by PCR NEGATIVE NEGATIVE Final   Influenza B by PCR NEGATIVE  NEGATIVE Final    Comment: (NOTE) The Xpert Xpress SARS-CoV-2/FLU/RSV plus assay is intended as an aid in the diagnosis of influenza from Nasopharyngeal swab specimens and should not be used as a sole basis for treatment. Nasal washings and aspirates are unacceptable for Xpert Xpress SARS-CoV-2/FLU/RSV testing.  Fact Sheet for Patients: EntrepreneurPulse.com.au  Fact Sheet for Healthcare Providers: IncredibleEmployment.be  This test is not yet approved or cleared by the [place] FDA and has been authorized for detection and/or diagnosis of SARS-CoV-2 by FDA under an Emergency Use Authorization (EUA). This EUA will remain in effect (meaning this test can be used) for the duration of the COVID-19 declaration under Section 564(b)(1) of the Act, 21 U.S.C. section 360bbb-3(b)(1), unless the authorization is terminated or revoked.  Performed at [facility], [address], [place], [place] [address]   MRSA PCR Screening     Status: Abnormal   Collection Time: 10/06/20  2:28 AM   Specimen: Nasopharyngeal  Result Value Ref Range Status   MRSA by PCR POSITIVE (A) NEGATIVE Final    Comment:        The GeneXpert MRSA Assay (FDA approved for NASAL specimens only), is one component of a comprehensive MRSA colonization surveillance program. It is not intended to diagnose MRSA infection nor to guide or monitor treatment for MRSA infections. RESULT CALLED TO, READ BACK BY AND VERIFIED WITH: QMGNOIBB,C @0922  10/06/20 BY BEARD,S Performed at [facility], [address] [place], [place] [address]    Today   Subjective    Chelsea Burnham today has no new complaints No fever  Or chills   No Nausea, Vomiting or Diarrhea Post ambulation drop in O2 sats noted        Patient has been seen and examined prior to discharge   Objective   Blood pressure (!) 164/79, pulse 67, temperature 98.6 F (37 C), temperature source Oral, resp. rate 20, height  5' 3"  (1.6 m), weight 86.7 kg, last menstrual period 05/05/2015, SpO2 93 %.   Intake/Output Summary (Last 24 hours) at 10/07/2020 0948 Last data filed at 10/06/2020 2300 Gross per 24 hour  Intake 360 ml  Output 3020 ml  Net -2660 ml    Exam Gen:- Awake Alert,  speaking in full sentences HEENT:- [place].AT, legally blind Nose- [place] 2L/min  Neck-Supple Neck,No JVD,.  Lungs-improved air movement, no wheeze  CV- S1, S2 normal, regular  Abd-  +ve B.Sounds, Abd Soft, No tenderness,    Extremity/Skin:- No  edema, pedal pulses present  Psych-affect is appropriate, oriented x3 Neuro-no new focal deficits, no tremors MSK-left upper extremity AV fistula with bruit and thrill   Data Review   CBC w Diff:  Lab Results  Component Value Date   WBC 3.9 (L) 10/06/2020   HGB 7.8 (L) 10/06/2020   HGB 12.1  05/14/2018   HCT 25.7 (L) 10/06/2020   HCT 35.9 05/14/2018   PLT 139 (L) 10/06/2020   PLT 171 05/14/2018   LYMPHOPCT 23 10/05/2020   MONOPCT 18 10/05/2020   EOSPCT 1 10/05/2020   BASOPCT 1 10/05/2020    CMP:  Lab Results  Component Value Date   NA 133 (L) 10/06/2020   NA 136 08/29/2018   K 4.9 10/06/2020   CL 97 (L) 10/06/2020   CO2 24 10/06/2020   BUN 44 (H) 10/06/2020   BUN 40 (H) 08/29/2018   CREATININE 9.03 (H) 10/06/2020   CREATININE 6.52 (H) 06/15/2020   PROT 7.0 10/05/2020   PROT 7.2 08/29/2018   ALBUMIN 3.3 (L) 10/06/2020   ALBUMIN 4.2 08/29/2018   BILITOT 0.6 10/05/2020   BILITOT 0.3 08/29/2018   ALKPHOS 84 10/05/2020   AST 23 10/05/2020   ALT 25 10/05/2020  .   Total Discharge time is about 33 minutes  Roxan Hockey M.D on 10/07/2020 at 9:48 AM  Go to [email or web address] -  for contact info  [facility] - Office  [phone]

## 2020-10-07 NOTE — Discharge Instructions (Signed)
1)you need oxygen at home at 2 L via nasal cannula continuously while awake and while asleep--- smoking or having open fires around oxygen can cause fire, significant injury and death  2) continue hemodialysis per usual schedule on Tuesdays times days and Saturdays, please avoid excessive salt and fluid intake  3)Smoking cessation strongly advised--- may use over-the-counter nicotine patch

## 2020-10-07 NOTE — TOC Transition Note (Signed)
Transition of Care [facility]) - CM/SW Discharge Note   Patient Details  Name: Kara Knapp MRN: [id number] Date of Birth: April 06, 1971  Transition of Care [facility]) CM/SW Contact:  Boneta Lucks, RN Phone Number: 10/07/2020, 10:50 AM   Clinical Narrative:   Patient discharging home today with home oxygen provided by [employer]. Patient is blind and needs transportation home.  [facility] transportation set up for 1:15, Patient and RN updated. Waiver signed and in chart.      Final next level of care: Home/Self Care Barriers to Discharge: Barriers Resolved   Patient Goals and CMS Choice Patient states their goals for this hospitalization and ongoing recovery are:: to go home. CMS Medicare.gov Compare Post Acute Care list provided to:: Patient Choice offered to / list presented to : Patient  Discharge Placement                Patient to be transferred to facility by: [facility] transportation   Patient and family notified of of transfer: 10/07/20  Discharge Plan and Services                DME Arranged: Oxygen DME Agency: [employer] Date DME Agency Contacted: 10/06/20 Time DME Agency Contacted: 1112 Representative spoke with at DME Agency: Barbaraann Rondo       Readmission Risk Interventions Readmission Risk Prevention Plan 10/07/2020  Transportation Screening Complete  PCP or Specialist Appt within 3-5 Days Complete  HRI or [place] Complete  Social Work Consult for [place] Planning/Counseling Complete  Palliative Care Screening Not Applicable  Medication Review Press photographer) Complete  Some recent data might be hidden

## 2020-10-08 DIAGNOSIS — N250 Renal osteodystrophy: Secondary | ICD-10-CM | POA: Diagnosis not present

## 2020-10-08 DIAGNOSIS — D631 Anemia in chronic kidney disease: Secondary | ICD-10-CM | POA: Diagnosis not present

## 2020-10-08 DIAGNOSIS — N186 End stage renal disease: Secondary | ICD-10-CM | POA: Diagnosis not present

## 2020-10-08 DIAGNOSIS — Z992 Dependence on renal dialysis: Secondary | ICD-10-CM | POA: Diagnosis not present

## 2020-10-08 DIAGNOSIS — D509 Iron deficiency anemia, unspecified: Secondary | ICD-10-CM | POA: Diagnosis not present

## 2020-10-11 DIAGNOSIS — N186 End stage renal disease: Secondary | ICD-10-CM | POA: Diagnosis not present

## 2020-10-11 DIAGNOSIS — Z992 Dependence on renal dialysis: Secondary | ICD-10-CM | POA: Diagnosis not present

## 2020-10-11 DIAGNOSIS — D509 Iron deficiency anemia, unspecified: Secondary | ICD-10-CM | POA: Diagnosis not present

## 2020-10-11 DIAGNOSIS — D631 Anemia in chronic kidney disease: Secondary | ICD-10-CM | POA: Diagnosis not present

## 2020-10-11 DIAGNOSIS — N250 Renal osteodystrophy: Secondary | ICD-10-CM | POA: Diagnosis not present

## 2020-10-12 DIAGNOSIS — F17210 Nicotine dependence, cigarettes, uncomplicated: Secondary | ICD-10-CM | POA: Diagnosis not present

## 2020-10-12 DIAGNOSIS — I10 Essential (primary) hypertension: Secondary | ICD-10-CM | POA: Diagnosis not present

## 2020-10-12 DIAGNOSIS — E1122 Type 2 diabetes mellitus with diabetic chronic kidney disease: Secondary | ICD-10-CM | POA: Diagnosis not present

## 2020-10-12 DIAGNOSIS — J439 Emphysema, unspecified: Secondary | ICD-10-CM | POA: Diagnosis not present

## 2020-10-12 DIAGNOSIS — Z299 Encounter for prophylactic measures, unspecified: Secondary | ICD-10-CM | POA: Diagnosis not present

## 2020-10-12 DIAGNOSIS — E1165 Type 2 diabetes mellitus with hyperglycemia: Secondary | ICD-10-CM | POA: Diagnosis not present

## 2020-10-13 DIAGNOSIS — N186 End stage renal disease: Secondary | ICD-10-CM | POA: Diagnosis not present

## 2020-10-13 DIAGNOSIS — N250 Renal osteodystrophy: Secondary | ICD-10-CM | POA: Diagnosis not present

## 2020-10-13 DIAGNOSIS — D509 Iron deficiency anemia, unspecified: Secondary | ICD-10-CM | POA: Diagnosis not present

## 2020-10-13 DIAGNOSIS — Z992 Dependence on renal dialysis: Secondary | ICD-10-CM | POA: Diagnosis not present

## 2020-10-13 DIAGNOSIS — D631 Anemia in chronic kidney disease: Secondary | ICD-10-CM | POA: Diagnosis not present

## 2020-10-14 ENCOUNTER — Ambulatory Visit (HOSPITAL_COMMUNITY): Payer: Medicare Other | Admitting: Clinical

## 2020-10-14 ENCOUNTER — Other Ambulatory Visit: Payer: Self-pay

## 2020-10-18 DIAGNOSIS — Z992 Dependence on renal dialysis: Secondary | ICD-10-CM | POA: Diagnosis not present

## 2020-10-18 DIAGNOSIS — N250 Renal osteodystrophy: Secondary | ICD-10-CM | POA: Diagnosis not present

## 2020-10-18 DIAGNOSIS — D631 Anemia in chronic kidney disease: Secondary | ICD-10-CM | POA: Diagnosis not present

## 2020-10-18 DIAGNOSIS — D509 Iron deficiency anemia, unspecified: Secondary | ICD-10-CM | POA: Diagnosis not present

## 2020-10-18 DIAGNOSIS — N186 End stage renal disease: Secondary | ICD-10-CM | POA: Diagnosis not present

## 2020-10-20 DIAGNOSIS — N186 End stage renal disease: Secondary | ICD-10-CM | POA: Diagnosis not present

## 2020-10-20 DIAGNOSIS — Z992 Dependence on renal dialysis: Secondary | ICD-10-CM | POA: Diagnosis not present

## 2020-10-20 DIAGNOSIS — D631 Anemia in chronic kidney disease: Secondary | ICD-10-CM | POA: Diagnosis not present

## 2020-10-20 DIAGNOSIS — D509 Iron deficiency anemia, unspecified: Secondary | ICD-10-CM | POA: Diagnosis not present

## 2020-10-20 DIAGNOSIS — N250 Renal osteodystrophy: Secondary | ICD-10-CM | POA: Diagnosis not present

## 2020-10-21 DIAGNOSIS — Z6833 Body mass index (BMI) 33.0-33.9, adult: Secondary | ICD-10-CM | POA: Diagnosis not present

## 2020-10-21 DIAGNOSIS — N186 End stage renal disease: Secondary | ICD-10-CM | POA: Diagnosis not present

## 2020-10-21 DIAGNOSIS — Z299 Encounter for prophylactic measures, unspecified: Secondary | ICD-10-CM | POA: Diagnosis not present

## 2020-10-21 DIAGNOSIS — I10 Essential (primary) hypertension: Secondary | ICD-10-CM | POA: Diagnosis not present

## 2020-10-21 DIAGNOSIS — E1122 Type 2 diabetes mellitus with diabetic chronic kidney disease: Secondary | ICD-10-CM | POA: Diagnosis not present

## 2020-10-21 DIAGNOSIS — E1165 Type 2 diabetes mellitus with hyperglycemia: Secondary | ICD-10-CM | POA: Diagnosis not present

## 2020-10-22 DIAGNOSIS — D631 Anemia in chronic kidney disease: Secondary | ICD-10-CM | POA: Diagnosis not present

## 2020-10-22 DIAGNOSIS — D509 Iron deficiency anemia, unspecified: Secondary | ICD-10-CM | POA: Diagnosis not present

## 2020-10-22 DIAGNOSIS — N250 Renal osteodystrophy: Secondary | ICD-10-CM | POA: Diagnosis not present

## 2020-10-22 DIAGNOSIS — Z992 Dependence on renal dialysis: Secondary | ICD-10-CM | POA: Diagnosis not present

## 2020-10-22 DIAGNOSIS — N186 End stage renal disease: Secondary | ICD-10-CM | POA: Diagnosis not present

## 2020-10-25 DIAGNOSIS — Z992 Dependence on renal dialysis: Secondary | ICD-10-CM | POA: Diagnosis not present

## 2020-10-25 DIAGNOSIS — N186 End stage renal disease: Secondary | ICD-10-CM | POA: Diagnosis not present

## 2020-10-25 DIAGNOSIS — N250 Renal osteodystrophy: Secondary | ICD-10-CM | POA: Diagnosis not present

## 2020-10-25 DIAGNOSIS — D631 Anemia in chronic kidney disease: Secondary | ICD-10-CM | POA: Diagnosis not present

## 2020-10-25 DIAGNOSIS — E119 Type 2 diabetes mellitus without complications: Secondary | ICD-10-CM | POA: Diagnosis not present

## 2020-10-25 DIAGNOSIS — Z794 Long term (current) use of insulin: Secondary | ICD-10-CM | POA: Diagnosis not present

## 2020-10-25 DIAGNOSIS — D509 Iron deficiency anemia, unspecified: Secondary | ICD-10-CM | POA: Diagnosis not present

## 2020-10-26 ENCOUNTER — Ambulatory Visit: Payer: Medicare Other | Admitting: Gastroenterology

## 2020-10-27 ENCOUNTER — Ambulatory Visit (INDEPENDENT_AMBULATORY_CARE_PROVIDER_SITE_OTHER): Payer: Medicare Other | Admitting: Clinical

## 2020-10-27 ENCOUNTER — Other Ambulatory Visit: Payer: Self-pay

## 2020-10-27 DIAGNOSIS — D631 Anemia in chronic kidney disease: Secondary | ICD-10-CM | POA: Diagnosis not present

## 2020-10-27 DIAGNOSIS — F3341 Major depressive disorder, recurrent, in partial remission: Secondary | ICD-10-CM

## 2020-10-27 DIAGNOSIS — Z992 Dependence on renal dialysis: Secondary | ICD-10-CM | POA: Diagnosis not present

## 2020-10-27 DIAGNOSIS — D509 Iron deficiency anemia, unspecified: Secondary | ICD-10-CM | POA: Diagnosis not present

## 2020-10-27 DIAGNOSIS — N186 End stage renal disease: Secondary | ICD-10-CM | POA: Diagnosis not present

## 2020-10-27 DIAGNOSIS — N250 Renal osteodystrophy: Secondary | ICD-10-CM | POA: Diagnosis not present

## 2020-10-27 NOTE — Progress Notes (Signed)
Virtual Visit via Telephone Note  I connected withViolet A Berrieron3/31/22at 2:00 PM EDTby telephoneand verified that I am speaking with the correct person using two identifiers.  Location: Patient:Home Provider:Office  I discussed the limitations of evaluation and management by telemedicine and the availability of in person appointments. The patient expressed understanding and agreed to proceed.    THERAPIST PROGRESS NOTE  Session Time:2:00PM-2:30PM  Participation Level:Active  Behavioral Response:CasualAlertDepressed  Type of Therapy:Individual Therapy  Treatment Goals addressed:Coping  Interventions:CBT  Summary:Kara A Berrieris a 50 y.o.femalewho presents with Depression.The OPT therapist worked with thepatientfor herongoing OPT treatmentsession. The OPT therapist utilized Motivational Interviewing to assist in creating therapeutic repore. The patient in the session was engaged and work in Science writer about hertriggers and symptoms over the past few weeksincluding death of some family. Additionally the patient has been in conflict with a female friend of hers who she has decided she needs to cut out of her life. The patient has been having difficulty with her COPD.The OPT therapist utilized Cognitive Behavioral Therapy through cognitive restructuring as well as worked with the patient on coping strategies to assist in management ofmood and the patientverbalized her health has been improving.The patient reportedthe shecontinues to work on implementing positive thinking, self esteem, and staying activeas well as being aware of and consistent in keeping her health care appointments.  Suicidal/Homicidal:Nowithout intent/plan  Therapist Response:The OPT therapist worked with the patient for the patients scheduled session. The patient was engaged in his session and gave feedback in relation to triggers, symptoms, and  behavior responses over the pastfewweeks. The OPT therapist worked with the patient utilizing an in session Cognitive Behavioral Therapy exercise. The patient was responsive in the session and verbalized, "Kara Knapp just told them to not come back".The OPT therapist worked with the patient on implementing positive thinking and reviewing how this impacts mood.The OPT therapist will continue treatment work with the patient in hernext scheduled session  Plan: Return again in3weeks.  Diagnosis:Axis I:MDD (major depressive disorder), recurrent, in partial remission   Axis II:No diagnosis  I discussed the assessment and treatment plan with the patient. The patient was provided an opportunity to ask questions and all were answered. The patient agreed with the plan and demonstrated an understanding of the instructions.  The patient was advised to call back or seek an in-person evaluation if the symptoms worsen or if the condition fails to improve as anticipated.  I provided78mnutes of non-face-to-face time during this encounter.  TLennox Grumbles LCSW  10/27/2020

## 2020-10-31 ENCOUNTER — Ambulatory Visit: Payer: Medicare Other | Admitting: Gastroenterology

## 2020-11-01 DIAGNOSIS — D631 Anemia in chronic kidney disease: Secondary | ICD-10-CM | POA: Diagnosis not present

## 2020-11-01 DIAGNOSIS — N250 Renal osteodystrophy: Secondary | ICD-10-CM | POA: Diagnosis not present

## 2020-11-01 DIAGNOSIS — N2581 Secondary hyperparathyroidism of renal origin: Secondary | ICD-10-CM | POA: Diagnosis not present

## 2020-11-01 DIAGNOSIS — Z992 Dependence on renal dialysis: Secondary | ICD-10-CM | POA: Diagnosis not present

## 2020-11-01 DIAGNOSIS — E559 Vitamin D deficiency, unspecified: Secondary | ICD-10-CM | POA: Diagnosis not present

## 2020-11-01 DIAGNOSIS — N186 End stage renal disease: Secondary | ICD-10-CM | POA: Diagnosis not present

## 2020-11-02 ENCOUNTER — Telehealth: Payer: Self-pay | Admitting: Internal Medicine

## 2020-11-02 ENCOUNTER — Other Ambulatory Visit: Payer: Self-pay

## 2020-11-02 DIAGNOSIS — Z8619 Personal history of other infectious and parasitic diseases: Secondary | ICD-10-CM

## 2020-11-02 DIAGNOSIS — K7460 Unspecified cirrhosis of liver: Secondary | ICD-10-CM

## 2020-11-02 NOTE — Telephone Encounter (Signed)
Returned the pt's call will mail her lab forms closer to her visit which is May 9th @ 11 am with Neil Crouch

## 2020-11-02 NOTE — Telephone Encounter (Signed)
PLEASE FAX LAB PAPERS TO THE LAB SO SHE CAN HAVE THEM DONE

## 2020-11-02 NOTE — Telephone Encounter (Signed)
PATIENT NEEDS HER LABS AND LOST THE PAPERS, CAN YOU PLEASE FAX THEM TO THE LAB

## 2020-11-03 DIAGNOSIS — N186 End stage renal disease: Secondary | ICD-10-CM | POA: Diagnosis not present

## 2020-11-03 DIAGNOSIS — D631 Anemia in chronic kidney disease: Secondary | ICD-10-CM | POA: Diagnosis not present

## 2020-11-03 DIAGNOSIS — N250 Renal osteodystrophy: Secondary | ICD-10-CM | POA: Diagnosis not present

## 2020-11-03 DIAGNOSIS — E559 Vitamin D deficiency, unspecified: Secondary | ICD-10-CM | POA: Diagnosis not present

## 2020-11-03 DIAGNOSIS — Z992 Dependence on renal dialysis: Secondary | ICD-10-CM | POA: Diagnosis not present

## 2020-11-03 DIAGNOSIS — N2581 Secondary hyperparathyroidism of renal origin: Secondary | ICD-10-CM | POA: Diagnosis not present
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
## 2020-11-05 DIAGNOSIS — N186 End stage renal disease: Secondary | ICD-10-CM | POA: Diagnosis not present

## 2020-11-05 DIAGNOSIS — N2581 Secondary hyperparathyroidism of renal origin: Secondary | ICD-10-CM | POA: Diagnosis not present

## 2020-11-05 DIAGNOSIS — Z992 Dependence on renal dialysis: Secondary | ICD-10-CM | POA: Diagnosis not present

## 2020-11-05 DIAGNOSIS — N250 Renal osteodystrophy: Secondary | ICD-10-CM | POA: Diagnosis not present

## 2020-11-05 DIAGNOSIS — D631 Anemia in chronic kidney disease: Secondary | ICD-10-CM | POA: Diagnosis not present

## 2020-11-05 DIAGNOSIS — E559 Vitamin D deficiency, unspecified: Secondary | ICD-10-CM | POA: Diagnosis not present

## 2020-11-07 ENCOUNTER — Encounter (INDEPENDENT_AMBULATORY_CARE_PROVIDER_SITE_OTHER): Payer: Medicare Other | Admitting: Ophthalmology

## 2020-11-08 DIAGNOSIS — Z992 Dependence on renal dialysis: Secondary | ICD-10-CM | POA: Diagnosis not present

## 2020-11-08 DIAGNOSIS — E559 Vitamin D deficiency, unspecified: Secondary | ICD-10-CM | POA: Diagnosis not present

## 2020-11-08 DIAGNOSIS — D631 Anemia in chronic kidney disease: Secondary | ICD-10-CM | POA: Diagnosis not present

## 2020-11-08 DIAGNOSIS — N186 End stage renal disease: Secondary | ICD-10-CM | POA: Diagnosis not present

## 2020-11-08 DIAGNOSIS — N250 Renal osteodystrophy: Secondary | ICD-10-CM | POA: Diagnosis not present

## 2020-11-08 DIAGNOSIS — N2581 Secondary hyperparathyroidism of renal origin: Secondary | ICD-10-CM | POA: Diagnosis not present

## 2020-11-09 ENCOUNTER — Encounter (INDEPENDENT_AMBULATORY_CARE_PROVIDER_SITE_OTHER): Payer: Medicare Other | Admitting: Ophthalmology

## 2020-11-10 ENCOUNTER — Telehealth (INDEPENDENT_AMBULATORY_CARE_PROVIDER_SITE_OTHER): Payer: Medicare Other | Admitting: Psychiatry

## 2020-11-10 ENCOUNTER — Other Ambulatory Visit: Payer: Self-pay

## 2020-11-10 ENCOUNTER — Encounter: Payer: Self-pay | Admitting: Psychiatry

## 2020-11-10 DIAGNOSIS — D631 Anemia in chronic kidney disease: Secondary | ICD-10-CM | POA: Diagnosis not present

## 2020-11-10 DIAGNOSIS — N186 End stage renal disease: Secondary | ICD-10-CM | POA: Diagnosis not present

## 2020-11-10 DIAGNOSIS — F330 Major depressive disorder, recurrent, mild: Secondary | ICD-10-CM

## 2020-11-10 DIAGNOSIS — E559 Vitamin D deficiency, unspecified: Secondary | ICD-10-CM | POA: Diagnosis not present

## 2020-11-10 DIAGNOSIS — N250 Renal osteodystrophy: Secondary | ICD-10-CM | POA: Diagnosis not present

## 2020-11-10 DIAGNOSIS — N2581 Secondary hyperparathyroidism of renal origin: Secondary | ICD-10-CM | POA: Diagnosis not present

## 2020-11-10 DIAGNOSIS — Z992 Dependence on renal dialysis: Secondary | ICD-10-CM | POA: Diagnosis not present

## 2020-11-10 MED ORDER — ARIPIPRAZOLE 5 MG PO TABS
5.0000 mg | ORAL_TABLET | Freq: Every day | ORAL | 0 refills | Status: DC
Start: 2020-12-14 — End: 2021-02-06

## 2020-11-10 MED ORDER — SERTRALINE HCL 100 MG PO TABS: 150.0000 mg | ORAL_TABLET | Freq: Every day | ORAL | 1 refills | Status: DC

## 2020-11-10 NOTE — Patient Instructions (Signed)
1.Continuesertraline 150 mg daily  2.ContinueAbilify 5 mg at night 3. Next appointment- 7/11 at 9:30  The next visit will be in person visit. Please arrive 15 mins before the scheduled time.   [facility]  Address: [address], [place], [place] [address]

## 2020-11-11 ENCOUNTER — Encounter (HOSPITAL_COMMUNITY): Payer: Self-pay

## 2020-11-11 ENCOUNTER — Ambulatory Visit (HOSPITAL_COMMUNITY): Payer: Medicare Other | Admitting: Clinical

## 2020-11-12 DIAGNOSIS — E559 Vitamin D deficiency, unspecified: Secondary | ICD-10-CM | POA: Diagnosis not present

## 2020-11-12 DIAGNOSIS — D631 Anemia in chronic kidney disease: Secondary | ICD-10-CM | POA: Diagnosis not present

## 2020-11-12 DIAGNOSIS — N250 Renal osteodystrophy: Secondary | ICD-10-CM | POA: Diagnosis not present

## 2020-11-12 DIAGNOSIS — N2581 Secondary hyperparathyroidism of renal origin: Secondary | ICD-10-CM | POA: Diagnosis not present

## 2020-11-12 DIAGNOSIS — N186 End stage renal disease: Secondary | ICD-10-CM | POA: Diagnosis not present

## 2020-11-12 DIAGNOSIS — Z992 Dependence on renal dialysis: Secondary | ICD-10-CM | POA: Diagnosis not present

## 2020-11-15 DIAGNOSIS — N2581 Secondary hyperparathyroidism of renal origin: Secondary | ICD-10-CM | POA: Diagnosis not present

## 2020-11-15 DIAGNOSIS — Z992 Dependence on renal dialysis: Secondary | ICD-10-CM | POA: Diagnosis not present

## 2020-11-15 DIAGNOSIS — E559 Vitamin D deficiency, unspecified: Secondary | ICD-10-CM | POA: Diagnosis not present

## 2020-11-15 DIAGNOSIS — D631 Anemia in chronic kidney disease: Secondary | ICD-10-CM | POA: Diagnosis not present

## 2020-11-15 DIAGNOSIS — N250 Renal osteodystrophy: Secondary | ICD-10-CM | POA: Diagnosis not present

## 2020-11-15 DIAGNOSIS — N186 End stage renal disease: Secondary | ICD-10-CM | POA: Diagnosis not present

## 2020-11-17 DIAGNOSIS — D631 Anemia in chronic kidney disease: Secondary | ICD-10-CM | POA: Diagnosis not present

## 2020-11-17 DIAGNOSIS — Z992 Dependence on renal dialysis: Secondary | ICD-10-CM | POA: Diagnosis not present

## 2020-11-17 DIAGNOSIS — E559 Vitamin D deficiency, unspecified: Secondary | ICD-10-CM | POA: Diagnosis not present

## 2020-11-17 DIAGNOSIS — N186 End stage renal disease: Secondary | ICD-10-CM | POA: Diagnosis not present

## 2020-11-17 DIAGNOSIS — N2581 Secondary hyperparathyroidism of renal origin: Secondary | ICD-10-CM | POA: Diagnosis not present

## 2020-11-17 DIAGNOSIS — N250 Renal osteodystrophy: Secondary | ICD-10-CM | POA: Diagnosis not present

## 2020-11-19 DIAGNOSIS — Z992 Dependence on renal dialysis: Secondary | ICD-10-CM | POA: Diagnosis not present

## 2020-11-19 DIAGNOSIS — N250 Renal osteodystrophy: Secondary | ICD-10-CM | POA: Diagnosis not present

## 2020-11-19 DIAGNOSIS — N186 End stage renal disease: Secondary | ICD-10-CM | POA: Diagnosis not present

## 2020-11-19 DIAGNOSIS — N2581 Secondary hyperparathyroidism of renal origin: Secondary | ICD-10-CM | POA: Diagnosis not present

## 2020-11-19 DIAGNOSIS — D631 Anemia in chronic kidney disease: Secondary | ICD-10-CM | POA: Diagnosis not present

## 2020-11-19 DIAGNOSIS — E559 Vitamin D deficiency, unspecified: Secondary | ICD-10-CM | POA: Diagnosis not present

## 2020-11-22 ENCOUNTER — Telehealth: Payer: Self-pay

## 2020-11-22 DIAGNOSIS — Z992 Dependence on renal dialysis: Secondary | ICD-10-CM | POA: Diagnosis not present

## 2020-11-22 DIAGNOSIS — N2581 Secondary hyperparathyroidism of renal origin: Secondary | ICD-10-CM | POA: Diagnosis not present

## 2020-11-22 DIAGNOSIS — E559 Vitamin D deficiency, unspecified: Secondary | ICD-10-CM | POA: Diagnosis not present

## 2020-11-22 DIAGNOSIS — N250 Renal osteodystrophy: Secondary | ICD-10-CM | POA: Diagnosis not present

## 2020-11-22 DIAGNOSIS — N186 End stage renal disease: Secondary | ICD-10-CM | POA: Diagnosis not present

## 2020-11-22 DIAGNOSIS — D631 Anemia in chronic kidney disease: Secondary | ICD-10-CM | POA: Diagnosis not present

## 2020-11-22 NOTE — Telephone Encounter (Signed)
Kara Knapp [phone]) from [facility] phoned to see if it is safe for this pt to do iron. Pt has a history of Liver Disease 2019. She had been avoiding IV Iron until now. She cannot keep a safe level. Please call with input.

## 2020-11-24 DIAGNOSIS — N186 End stage renal disease: Secondary | ICD-10-CM | POA: Diagnosis not present

## 2020-11-24 DIAGNOSIS — Z992 Dependence on renal dialysis: Secondary | ICD-10-CM | POA: Diagnosis not present

## 2020-11-24 DIAGNOSIS — E559 Vitamin D deficiency, unspecified: Secondary | ICD-10-CM | POA: Diagnosis not present

## 2020-11-24 DIAGNOSIS — D631 Anemia in chronic kidney disease: Secondary | ICD-10-CM | POA: Diagnosis not present

## 2020-11-24 DIAGNOSIS — N250 Renal osteodystrophy: Secondary | ICD-10-CM | POA: Diagnosis not present

## 2020-11-24 DIAGNOSIS — N2581 Secondary hyperparathyroidism of renal origin: Secondary | ICD-10-CM | POA: Diagnosis not present

## 2020-11-24 NOTE — Telephone Encounter (Addendum)
Tried to call Mr. Kara Knapp back. Called number listed. Call was answered but sounded like hung up or disconnected. Left text message for return call.    Patient has history of suspected cirrhosis. On liver biopsy in 2019 she had secondary iron overload which was suspected to be due to her iron infusions at that time. I do not have any recent iron labs to review. Labs from 05/2020 show normal iron levels.   1. I would not recommend iron infusions from a liver standpoint.  2. Patient has upcoming OV. Can address with her at that time.  3. Can we request copy of recent CBC, iron labs from nephrology for review.  4. Please try to reach out to Mr. Kara Knapp with this information.

## 2020-11-24 NOTE — Telephone Encounter (Signed)
I have sent a request for labs to be faxed to [place].

## 2020-11-24 NOTE — Telephone Encounter (Signed)
NOTED   He was on the line with me trying to assume it was me who had called, then he couldn't talk now because he is in a appt. He states that he expects a call from [place] shortly.

## 2020-11-25 ENCOUNTER — Inpatient Hospital Stay (HOSPITAL_COMMUNITY): Payer: Medicare Other

## 2020-11-25 DIAGNOSIS — F319 Bipolar disorder, unspecified: Secondary | ICD-10-CM | POA: Diagnosis not present

## 2020-11-25 DIAGNOSIS — N390 Urinary tract infection, site not specified: Secondary | ICD-10-CM | POA: Diagnosis not present

## 2020-11-25 DIAGNOSIS — G4700 Insomnia, unspecified: Secondary | ICD-10-CM | POA: Diagnosis not present

## 2020-11-25 DIAGNOSIS — Z299 Encounter for prophylactic measures, unspecified: Secondary | ICD-10-CM | POA: Diagnosis not present

## 2020-11-25 DIAGNOSIS — Z789 Other specified health status: Secondary | ICD-10-CM | POA: Diagnosis not present

## 2020-11-25 DIAGNOSIS — J441 Chronic obstructive pulmonary disease with (acute) exacerbation: Secondary | ICD-10-CM | POA: Diagnosis not present

## 2020-11-25 NOTE — Telephone Encounter (Signed)
noted 

## 2020-11-25 NOTE — Telephone Encounter (Signed)
Phoned Kara Knapp back and read to him word for word what you are advising. He states he hasn't done any iron infusions and that the pt was stable but lately her hgb has been continuing to decline and something needs to be done. He is sending over copies of labs to show the trend of the decline. He states he can be contacted before 12 today. (they close early today). Also there are other things he stated that can be done but he will discuss that with you.

## 2020-11-25 NOTE — Telephone Encounter (Signed)
Spoke to Mr. Kara Knapp today.  Received copy of labs which will be scanned into the medical records.  Iron saturation March 2021: 22 January 2020: 11 April 2020: 13 August 2020: 11 September 27, 2020: 9 October 25, 2020: 13  Hemoglobin January 2022: 9.31 August 2020: 9.1 September 27, 2020: 8.6 October 25, 2020: 7.5 November 01, 2020: 7.3 11/22/2020: 7.9  Ferritin March 2021 379 June 2021: 152 January 2022: 59 September 27, 2020: 36 March 29 clinical history: 74  From a renal stand point: Per Mr. Kara Knapp, goal of hemoglobin around 10, iron saturations above 15%.  Typically give Venifer 100 mg per dose.  Particular patient has went from Epogen 4000 units per treatment to 12,000 units per treatment in the past 2 months trying to keep Hgb up.    Patient has upcoming appointment to see me on May 9.  She also has upcoming appointment with hematology.  Discussed with Mr. Kara Knapp, we both mutually agreed, hold off on iron infusions until she can be evaluated.  Patient may need updating endoscopy/colonoscopy to determine why she has progressive IDA.  I will touch base with Mr.Kara Knapp after appt.

## 2020-11-26 DIAGNOSIS — N250 Renal osteodystrophy: Secondary | ICD-10-CM | POA: Diagnosis not present

## 2020-11-26 DIAGNOSIS — J449 Chronic obstructive pulmonary disease, unspecified: Secondary | ICD-10-CM | POA: Diagnosis not present

## 2020-11-26 DIAGNOSIS — N186 End stage renal disease: Secondary | ICD-10-CM | POA: Diagnosis not present

## 2020-11-26 DIAGNOSIS — E559 Vitamin D deficiency, unspecified: Secondary | ICD-10-CM | POA: Diagnosis not present

## 2020-11-26 DIAGNOSIS — N2581 Secondary hyperparathyroidism of renal origin: Secondary | ICD-10-CM | POA: Diagnosis not present

## 2020-11-26 DIAGNOSIS — Z992 Dependence on renal dialysis: Secondary | ICD-10-CM | POA: Diagnosis not present

## 2020-11-26 DIAGNOSIS — D631 Anemia in chronic kidney disease: Secondary | ICD-10-CM | POA: Diagnosis not present

## 2020-11-26 DIAGNOSIS — E119 Type 2 diabetes mellitus without complications: Secondary | ICD-10-CM | POA: Diagnosis not present

## 2020-11-28 ENCOUNTER — Other Ambulatory Visit (HOSPITAL_COMMUNITY): Payer: Medicare Other

## 2020-11-30 ENCOUNTER — Encounter (INDEPENDENT_AMBULATORY_CARE_PROVIDER_SITE_OTHER): Payer: Medicare Other | Admitting: Ophthalmology

## 2020-12-01 ENCOUNTER — Ambulatory Visit (HOSPITAL_COMMUNITY): Payer: Medicare Other | Admitting: Hematology

## 2020-12-02 DIAGNOSIS — Z1159 Encounter for screening for other viral diseases: Secondary | ICD-10-CM | POA: Diagnosis not present

## 2020-12-02 DIAGNOSIS — K7460 Unspecified cirrhosis of liver: Secondary | ICD-10-CM | POA: Diagnosis not present

## 2020-12-02 DIAGNOSIS — R768 Other specified abnormal immunological findings in serum: Secondary | ICD-10-CM | POA: Diagnosis not present

## 2020-12-05 ENCOUNTER — Ambulatory Visit: Payer: Medicare Other | Admitting: Gastroenterology

## 2020-12-07 ENCOUNTER — Encounter (INDEPENDENT_AMBULATORY_CARE_PROVIDER_SITE_OTHER): Payer: Self-pay | Admitting: Ophthalmology

## 2020-12-07 ENCOUNTER — Ambulatory Visit (INDEPENDENT_AMBULATORY_CARE_PROVIDER_SITE_OTHER): Payer: Medicare Other | Admitting: Ophthalmology

## 2020-12-07 DIAGNOSIS — E113511 Type 2 diabetes mellitus with proliferative diabetic retinopathy with macular edema, right eye: Secondary | ICD-10-CM

## 2020-12-07 DIAGNOSIS — H4311 Vitreous hemorrhage, right eye: Secondary | ICD-10-CM

## 2020-12-07 DIAGNOSIS — H211X1 Other vascular disorders of iris and ciliary body, right eye: Secondary | ICD-10-CM | POA: Diagnosis not present

## 2020-12-07 DIAGNOSIS — Z9889 Other specified postprocedural states: Secondary | ICD-10-CM | POA: Diagnosis not present

## 2020-12-07 MED ORDER — BEVACIZUMAB 2.5 MG/0.1ML IZ SOSY
2.5000 mg | PREFILLED_SYRINGE | INTRAVITREAL | Status: AC | PRN
  Administered 2020-12-07: 2.5 mg via INTRAVITREAL

## 2020-12-07 NOTE — Assessment & Plan Note (Signed)
Chronic and stable.   

## 2020-12-07 NOTE — Assessment & Plan Note (Signed)
Currently resolved OD and prevented with periodic, quarterly injections into vegF

## 2020-12-07 NOTE — Progress Notes (Signed)
12/07/2020     CHIEF COMPLAINT Patient presents for Retina Follow Up (3 month fu OU Kara Knapp OD/Pt states, "I feel like my va is worse. I feel like I am in a tunnel and it is just my OD. I am not sure how long it has been like this but it has been a couple of weeks."/A1C: 6.8/LBS: 115/Combigan QHS OU)   HISTORY OF PRESENT ILLNESS: Kara Knapp is a 50 y.o. female who presents to the clinic today for:   HPI    Retina Follow Up    Diagnosis: Diabetic Retinopathy   Laterality: right eye   Onset: 3 months ago   Severity: mild   Duration: 3 months   Course: gradually worsening   Comments: 3 month fu OU /Avastin OD Pt states, "I feel like my va is worse. I feel like I am in a tunnel and it is just my OD. I am not sure how long it has been like this but it has been a couple of weeks." A1C: 6.8 LBS: 115 Combigan QHS OU       Last edited by Kara Knapp, COA on 12/07/2020 10:20 AM. (History)      Referring physician: Medicine, Kara Knapp,  Kara Knapp [id number]  HISTORICAL INFORMATION:   Selected notes from the MEDICAL RECORD NUMBER    Lab Results  Component Value Date   HGBA1C 7.2 (H) 08/29/2018     CURRENT MEDICATIONS: Current Outpatient Medications (Ophthalmic Drugs)  Medication Sig  . COMBIGAN 0.2-0.5 % ophthalmic solution INSTILL ONE DROP IN [facility] EYE TWICE DAILY (Patient taking differently: 1 drop once.)   No current facility-administered medications for this visit. (Ophthalmic Drugs)   Current Outpatient Medications (Other)  Medication Sig  . acetaminophen (TYLENOL) 500 MG tablet Take 1,000 mg by mouth every 6 (six) hours as needed for moderate pain or headache.  Derrill Memo ON 12/14/2020] ARIPiprazole (ABILIFY) 5 MG tablet Take 1 tablet (5 mg total) by mouth daily.  Marland Kitchen azelastine (ASTELIN) 0.1 % nasal spray Place 1 spray into both nostrils 2 (two) times daily. Use in each nostril as directed (Patient not taking: Reported on 10/05/2020)  . carvedilol  (COREG) 12.5 MG tablet Take 1 tablet (12.5 mg total) by mouth 2 (two) times daily.  . cholecalciferol (VITAMIN D) 1000 units tablet Take 1,000 Units by mouth daily.   . Continuous Blood Gluc Sensor (FREESTYLE LIBRE 14 DAY SENSOR) MISC Inject 1 each into the skin every 14 (fourteen) days. Use as directed.  . cyclobenzaprine (FLEXERIL) 5 MG tablet TAKE ONE TABLET BY MOUTH THREE TIMES DAILY AS NEEDED FOR MUSCLE SPASM  . dexamethasone (DECADRON) 4 MG tablet Take 1 tablet (4 mg total) by mouth daily with breakfast.  . doxycycline (VIBRA-TABS) 100 MG tablet Take 1 tablet (100 mg total) by mouth 2 (two) times daily.  Marland Kitchen gabapentin (NEURONTIN) 100 MG capsule Take 1 capsule (100 mg total) by mouth 3 (three) times daily. (Needs to be seen before next refill)  . glucose blood (PRODIGY NO CODING BLOOD GLUC) test strip USE TO CHECK BLOOD SUGAR TWICE DAILY. Dx E11.22 (Patient not taking: Reported on 10/05/2020)  . hydrALAZINE (APRESOLINE) 50 MG tablet Take 1 tablet (50 mg total) by mouth 2 (two) times daily.  . Insulin Pen Needle (TRUEPLUS PEN NEEDLES) 31G X 5 MM MISC Use daily with insulin Dx E11.22  . LANTUS SOLOSTAR 100 UNIT/ML Solostar Pen INJECT 18-50 UNITS SUBCUTANEOUSLY AT BEDTIME. (Patient taking differently: Inject 20  Units into the skin at bedtime.)  . linagliptin (TRADJENTA) 5 MG TABS tablet Take 1 tablet (5 mg total) by mouth daily. (Patient taking differently: Take 5 mg by mouth every evening.)  . multivitamin (RENA-VIT) TABS tablet Take 1 tablet by mouth daily. (Patient not taking: Reported on 10/05/2020)  . pantoprazole (PROTONIX) 40 MG tablet TAKE 1 BY MOUTH DAILY 30 minutes before breakfast (Patient taking differently: Take 40 mg by mouth daily.)  . Probiotic Product (CULTURELLE PROBIOTICS PO) Take 1 capsule by mouth daily.   Marland Kitchen PRODIGY TWIST TOP LANCETS 28G MISC USE TO CHECK BLOOD SUGAR UP TO FOUR TIMES DAILY.  Marland Kitchen sertraline (ZOLOFT) 100 MG tablet Take 1.5 tablets (150 mg total) by mouth daily.  .  sevelamer carbonate (RENVELA) 800 MG tablet Take 800-2,400 mg by mouth See admin instructions. Take 2438m by mouth three times daily with means and 8015mwith snacks  . SYMBICORT 160-4.5 MCG/ACT inhaler Inhale 2 puffs into the lungs 2 (two) times daily.  . VENTOLIN HFA 108 (90 Base) MCG/ACT inhaler Inhale 2 puffs into the lungs every 4 (four) hours as needed for wheezing or shortness of breath.   No current facility-administered medications for this visit. (Other)      REVIEW OF SYSTEMS:    ALLERGIES Allergies  Allergen Reactions  . Sulfamethoxazole-Trimethoprim Nausea And Vomiting  . Bactrim [Sulfamethoxazole-Trimethoprim] Nausea And Vomiting  . Prednisone Other (See Comments)    "I was wide open and couldn't eat" per pt. Loss of appetite and insomnia  LOSS OF APPETITE,UNABLE TO SLEEP    PAST MEDICAL HISTORY Past Medical History:  Diagnosis Date  . Anemia of chronic disease   . Anxiety   . Asthma   . Blind left eye   . Bronchitis   . Cataract   . Cholecystitis, acute 05/26/2013   Status post cholecystectomy  . Chronic abdominal pain   . Chronic diarrhea   . COPD (chronic obstructive pulmonary disease) ([place]  . Depression   . Diabetic foot ulcer (HCS[place]08/08/2014  . Diastolic heart failure (HCS[place]  . ESRD on hemodialysis ([place]CC[place]   Started diaylsis 12/29/15  . Essential hypertension   . Fibroids   . Glaucoma   . History of blood transfusion   . History of pneumonia   . Hyperlipidemia   . Insulin-dependent diabetes mellitus with retinopathy   . Liver fibrosis    Negative Hep B surface antigen, negative Hep C antibody Feb 2018 (see scanned in labs).  . Neuropathy   . Osteomyelitis (HCTyr[place]   Toe on left foot   Past Surgical History:  Procedure Laterality Date  . A/V SHUNTOGRAM N/A 10/25/2016   Procedure: A/V Shuntogram - Right Arm;  Surgeon: BrWaynetta SandyMD;  [facility];  Service: Cardiovascular;  Laterality: N/A;  . A/V SHUNTOGRAM N/A  03/05/2018   Procedure: A/V SHUNTOGRAM - Right Arm;  Surgeon: CaWaynetta SandyMD;  [facility];  Service: Cardiovascular;  Laterality: N/A;  . AV FISTULA PLACEMENT Right 10/17/2015   Procedure: INSERTION OF ARTERIOVENOUS GORE-TEX GRAFT RIGHT UPPER ARM WITH ACUSEAL;  Surgeon: BrConrad BurlingtonMD;  [facility];  Service: Vascular;  Laterality: Right;  . AV FISTULA PLACEMENT Left 11/18/2019   Procedure: INSERTION OF ARTERIOVENOUS (AV) GORE-TEX GRAFT left  ARM;  Surgeon: BrSerafina MitchellMD;  Location: [facility] Service: Vascular;  Laterality: Left;  . CATARACT EXTRACTION W/ INTRAOCULAR LENS IMPLANT Bilateral   . CESAREAN SECTION    .  CHOLECYSTECTOMY N/A 05/25/2013   Procedure: LAPAROSCOPIC CHOLECYSTECTOMY;  Surgeon: Jamesetta So, MD;  Location: [facility];  Service: General;  Laterality: N/A;  . COLONOSCOPY WITH PROPOFOL N/A 10/02/2016   two 3 to 5 mm polyps in the descending colon, three 2 to 3 mm polyps in the rectum, random colon biopsies, rectal bleeding due to internal hemorrhoids, friability with no bleeding at the anus status post biopsy.  Surgical pathology found the polyps to be a mix of hyperplastic and tubular adenoma, random colon biopsies to be benign colonic mucosa, and the anal biopsies to be anal skin tag.   Marland Kitchen ESOPHAGOGASTRODUODENOSCOPY (EGD) WITH PROPOFOL N/A 10/02/2016   mucosal nodule in the esophagus status post biopsy, moderate gastritis status post biopsy, mild duodenitis. esophageal biopsy to be benign, gastric biopsies to be gastritis due to aspirin use, and duodenal biopsies to be duodenitis due to aspirin use  . EYE SURGERY Bilateral   . IR FLUORO GUIDE CV LINE RIGHT  10/09/2019  . IR REMOVAL TUN CV CATH W/O FL  01/15/2020  . IR THROMBECTOMY AV FISTULA W/THROMBOLYSIS/PTA INC/SHUNT/IMG RIGHT Right 12/26/2018  . IR THROMBECTOMY AV FISTULA W/THROMBOLYSIS/PTA INC/SHUNT/IMG RIGHT Right 08/10/2019  . IR THROMBECTOMY AV FISTULA W/THROMBOLYSIS/PTA/STENT INC/SHUNT/IMG  RT Right 08/08/2018  . IR THROMBECTOMY AV FISTULA W/THROMBOLYSIS/PTA/STENT INC/SHUNT/IMG RT Right 05/15/2019  . IR US GUIDE VASC ACCESS RIGHT  08/08/2018  . IR US GUIDE VASC ACCESS RIGHT  12/26/2018  . IR US GUIDE VASC ACCESS RIGHT  05/15/2019  . IR US GUIDE VASC ACCESS RIGHT  08/10/2019  . IR US GUIDE VASC ACCESS RIGHT  10/09/2019  . PARS PLANA VITRECTOMY Left 11/24/2014   Procedure: PARS PLANA VITRECTOMY WITH 25 GAUGE;  Surgeon: Hurman Horn, MD;  Location: [place];  Service: Ophthalmology;  Laterality: Left;  . PERIPHERAL VASCULAR BALLOON ANGIOPLASTY Right 03/05/2018   Procedure: PERIPHERAL VASCULAR BALLOON ANGIOPLASTY;  Surgeon: Waynetta Sandy, MD;  Location: [place] [facility] LAB;  Service: Cardiovascular;  Laterality: Right;  arm fistula  . PERIPHERAL VASCULAR CATHETERIZATION N/A 04/28/2015   Procedure: Bilateral Upper Extremity Venography;  Surgeon: Conrad [place], MD;  Location: [place] CV LAB;  Service: Cardiovascular;  Laterality: N/A;  . PHOTOCOAGULATION WITH LASER Left 11/24/2014   Procedure: PHOTOCOAGULATION WITH LASER;  Surgeon: Hurman Horn, MD;  Location: [place];  Service: Ophthalmology;  Laterality: Left;  with insertion of silicone oil  . SAVORY DILATION N/A 10/02/2016   Procedure: SAVORY DILATION;  Surgeon: Danie Binder, MD;  [facility];  Service: Endoscopy;  Laterality: N/A;  . TUBAL LIGATION    . UPPER EXTREMITY VENOGRAPHY Bilateral 11/02/2019   Procedure: UPPER EXTREMITY VENOGRAPHY;  Surgeon: Waynetta Sandy, MD;  Location: [place] [facility];  Service: Cardiovascular;  Laterality: Bilateral;    FAMILY HISTORY Family History  Problem Relation Age of Onset  . COPD Mother   . Cancer Father   . Lymphoma Father   . Diabetes Sister   . Deep vein thrombosis Sister   . Diabetes Brother   . Hyperlipidemia Brother   . Hypertension Brother   . Mental retardation Sister   . Alcohol abuse Paternal Grandmother   . Colon cancer Neg Hx   . Liver disease  Neg Hx     SOCIAL HISTORY Social History   Tobacco Use  . Smoking status: Current Every Day Smoker    Packs/day: 1.00    Years: 23.00    Pack years: 23.00    Types: Cigarettes  Start date: 12/03/2000  . Smokeless tobacco: Never Used  . Tobacco comment: one pack daily  Vaping Use  . Vaping Use: Never used  Substance Use Topics  . Alcohol use: No    Alcohol/week: 0.0 standard drinks  . Drug use: No    Comment: Sober for 8 years         OPHTHALMIC EXAM: Base Eye Exam    Visual Acuity (ETDRS)      Right Left   Dist [place] 20/100 +2 HM   Dist ph [place] NI        Tonometry (Tonopen, 10:23 AM)      Right Left   Pressure 16 26       Pupils      Dark Light Shape React APD   Right 5 4 Round Brisk None   Left 7 7 Round Minimal None       Visual Fields      Left Right   Restrictions Total superior temporal, inferior temporal, superior nasal, inferior nasal deficiencies Total superior temporal, inferior temporal, superior nasal, inferior nasal deficiencies       Neuro/Psych    Oriented x3: Yes   Mood/Affect: Normal       Dilation    Both eyes: 1.0% Mydriacyl, 2.5% Phenylephrine @ 10:23 AM        Slit Lamp and Fundus Exam    Slit Lamp Exam      Right Left   Lids/Lashes Normal Normal   Conjunctiva/Sclera White and quiet White and quiet   Cornea Clear Clear   Anterior Chamber Tube, ST Quad Deep and quiet, glaucoma tube AC   Iris Round and reactive, old minor NVI and active Old and active NVI   Lens Posterior chamber intraocular lens, Open posterior capsule Posterior chamber intraocular lens   Anterior Vitreous Dear Silicone oil       Fundus Exam      Right Left   Posterior Vitreous Vitrectomized yet Clear silicone   Disc Old fibrovascular disease of the nerve, 3+ Pallor White nerve   C/D Ratio 0.3 Atrophic   Macula macular thickening, Microaneurysms, no exudates, Focal laser scars Attached   Vessels PDR-quiet PDR-quiet   Periphery Laser scars, Laser scars,           IMAGING AND PROCEDURES  Imaging and Procedures for 12/07/20  OCT, Retina - OU - Both Eyes       Right Eye Quality was good. Scan locations included subfoveal. Central Foveal Thickness: 201. Progression has been stable. Findings include abnormal foveal contour.   Left Eye Quality was borderline. Scan locations included temporal. Progression has been stable. Findings include abnormal foveal contour.   Notes History of CSME OD, stabilized and improved on recurrent intravitreal Avastin also to prevent recurrent microscopic vitreous hemorrhages in this monocular patient       Intravitreal Injection, Pharmacologic Agent - OD - Right Eye       Time Out 12/07/2020. 10:39 AM. Confirmed correct patient, procedure, site, and patient consented.   Anesthesia Topical anesthesia was used. Anesthetic medications included Akten 3.5%, Proparacaine 0.5%.   Procedure Preparation included Tobramycin 0.3%, 10% betadine to eyelids. A supplied needle was used.   Injection:  2.5 mg Bevacizumab (AVASTIN) 2.5m/0.1mL SOSY   NDC:: 79024-097-35 Lot:: [id number]  Route: Intravitreal, Site: Right Eye  Post-op Post injection exam found visual acuity of at least counting fingers. The patient tolerated the procedure well. There were no complications. The patient received written and verbal post procedure  care education. Post injection medications were not given.                 ASSESSMENT/PLAN:  Diabetic macular edema of right eye with proliferative retinopathy associated with type 2 diabetes mellitus (HCC) Controlled diabetic macular edema the right eye now residual atrophy yet with underlying risk of neovascular sensation and secondary vitreous hemorrhage, have controlled recurrent vitreous hemorrhages with intravitreal Avastin.  We will deliver intravitreal Avastin today at 53-monthinterval and maintain 42-monthollow-up  Vitreous hemorrhage of right eye (HCPalmasCurrently resolved OD and  prevented with periodic, quarterly injections into vegF  Rubeosis iridis of right eye Chronic and stable      ICD-10-CM   1. Diabetic macular edema of right eye with proliferative retinopathy associated with type 2 diabetes mellitus (HCC)  E11.3511 OCT, Retina - OU - Both Eyes    Intravitreal Injection, Pharmacologic Agent - OD - Right Eye    bevacizumab (AVASTIN) SOSY 2.5 mg  2. Vitreous hemorrhage of right eye (HCGreen [place] H43.11   3. Rubeosis iridis of right eye  H21.1X1     1.  OD with a history of recurrent vitreous hemorrhages with impairment of visual acuity stabilized on periodic injection of intravitreal antivegF to control PDR, iris neovascularization which is occult in nature and recurrent bleeding despite complete PRP  2.  OS stable, comfortable eye observe  3.  Repeat intravitreal Avastin OD today and follow-up again in 4 months OU  Ophthalmic Meds Ordered this visit:  Meds ordered this encounter  Medications  . bevacizumab (AVASTIN) SOSY 2.5 mg       Return in about 4 months (around 04/09/2021) for DILATE OU, AVASTIN OCT, OD.  There are no Patient Instructions on file for this visit.   Explained the diagnoses, plan, and follow up with the patient and they expressed understanding.  Patient expressed understanding of the importance of proper follow up care.   GaClent Demarkankin M.D. Diseases & Surgery of the Retina and Vitreous Retina & Diabetic EyHanover5/11/22     Abbreviations: M myopia (nearsighted); A astigmatism; H hyperopia (farsighted); P presbyopia; Mrx spectacle prescription;  CTL contact lenses; OD right eye; OS left eye; OU both eyes  XT exotropia; ET esotropia; PEK punctate epithelial keratitis; PEE punctate epithelial erosions; DES dry eye syndrome; MGD meibomian gland dysfunction; ATs artificial tears; PFAT's preservative free artificial tears; NSWillisuclear sclerotic cataract; PSC posterior subcapsular cataract; ERM epi-retinal membrane; PVD posterior  vitreous detachment; RD retinal detachment; DM diabetes mellitus; DR diabetic retinopathy; NPDR non-proliferative diabetic retinopathy; PDR proliferative diabetic retinopathy; CSME clinically significant macular edema; DME diabetic macular edema; dbh dot blot hemorrhages; CWS cotton wool spot; POAG primary open angle glaucoma; C/D cup-to-disc ratio; HVF humphrey visual field; GVF goldmann visual field; OCT optical coherence tomography; IOP intraocular pressure; BRVO Branch retinal vein occlusion; CRVO central retinal vein occlusion; CRAO central retinal artery occlusion; BRAO branch retinal artery occlusion; RT retinal tear; SB scleral buckle; PPV pars plana vitrectomy; VH Vitreous hemorrhage; PRP panretinal laser photocoagulation; IVK intravitreal kenalog; VMT vitreomacular traction; MH Macular hole;  NVD neovascularization of the disc; NVE neovascularization elsewhere; AREDS age related eye disease study; ARMD age related macular degeneration; POAG primary open angle glaucoma; EBMD epithelial/anterior basement membrane dystrophy; ACIOL anterior chamber intraocular lens; IOL intraocular lens; PCIOL posterior chamber intraocular lens; Phaco/IOL phacoemulsification with intraocular lens placement; PRChurdanhotorefractive keratectomy; LASIK laser assisted in situ keratomileusis; HTN hypertension; DM diabetes mellitus; COPD chronic obstructive pulmonary disease

## 2020-12-07 NOTE — Assessment & Plan Note (Signed)
Controlled diabetic macular edema the right eye now residual atrophy yet with underlying risk of neovascular sensation and secondary vitreous hemorrhage, have controlled recurrent vitreous hemorrhages with intravitreal Avastin.  We will deliver intravitreal Avastin today at 20-monthinterval and maintain 410-monthollow-up

## 2020-12-07 NOTE — Assessment & Plan Note (Signed)
Observe a silicone in the eye

## 2020-12-08 LAB — HEPATITIS C GENOTYPE: HCV Genotype: NOT DETECTED

## 2020-12-08 LAB — HEPATITIS C RNA QUANTITATIVE
HCV Quantitative Log: <1.18 log IU/mL
HCV RNA, PCR, QN: <15 IU/mL

## 2020-12-08 LAB — HCV RNA,QN,PCR W/REFL TO GENOTYPE, LIPA(R)
HCV RNA, PCR, QN (Log): <1.18 log IU/mL
HCV RNA, PCR, QN: <15 IU/mL

## 2020-12-08 LAB — HEPATITIS C AB W/RFL RNA, PCR + GENO
Hepatitis C Ab: REACTIVE — AB
SIGNAL TO CUT-OFF: 6.49 — ABNORMAL HIGH (ref <1.00)

## 2020-12-12 ENCOUNTER — Ambulatory Visit (INDEPENDENT_AMBULATORY_CARE_PROVIDER_SITE_OTHER): Payer: Medicare Other | Admitting: Clinical

## 2020-12-12 ENCOUNTER — Other Ambulatory Visit: Payer: Self-pay

## 2020-12-12 DIAGNOSIS — F3341 Major depressive disorder, recurrent, in partial remission: Secondary | ICD-10-CM | POA: Diagnosis not present

## 2020-12-12 NOTE — Progress Notes (Signed)
Virtual Visit via Telephone Note  I connected withViolet A Berrieron5/16/22at 11:00 AM EDTby telephoneand verified that I am speaking with the correct person using two identifiers.  Location: Patient:Home Provider:Office  I discussed the limitations of evaluation and management by telemedicine and the availability of in person appointments. The patient expressed understanding and agreed to proceed.    THERAPIST PROGRESS NOTE  Session Time:11:10 AM-11:40AM  Participation Level:Active  Behavioral Response:CasualAlertDepressed  Type of Therapy:Individual Therapy  Treatment Goals addressed:Coping  Interventions:CBT  Summary:Kara A Berrieris a 50 y.o.femalewho presents with Depression.The OPT therapist worked with thepatientfor herongoing OPT treatmentsession. The OPT therapist utilized Motivational Interviewing to assist in creating therapeutic repore. The patient in the session was engaged and work in Science writer about hertriggers and symptoms over the past few weeksincluding adjusting in the aftermath of her Mother recently dying. Additionally the patient during the services had to be around family members that she has conflict with and these events have been triggering for the patient.The OPT therapist utilized Cognitive Behavioral Therapy through cognitive restructuring as well as worked with the patient on coping strategies to assist in management ofmood and as she continues to work through her Mother recently passing.The patient reportedthe shecontinues to work on implementing positive thinking, self esteem, and staying activeas well as being aware of and consistent in keeping her health care appointments. The patient noted she will be looking to move up here appointment with Dr. Modesta Messing due to her symptoms increasing due to her Mothers recent passing.  Suicidal/Homicidal:Nowithout intent/plan  Therapist Response:The  OPT therapist worked with the patient for the patients scheduled session. The patient was engaged in his session and gave feedback in relation to triggers, symptoms, and behavior responses over the pastfewweeks. The OPT therapist worked with the patient utilizing an in session Cognitive Behavioral Therapy exercise. The patient was responsive in the session and verbalized, "I think my Mother passing and having a few family members passing away in a short period of time has definitely been impacting me".The OPT therapist worked with the patient on implementing positive thinking and reviewing how large changes or transitions can act as triggers and for the patient to utilize coping even more to help in the upcoming weeks.The OPT therapist will continue treatment work with the patient in hernext scheduled session  Plan: Return again in3weeks.  Diagnosis:Axis I:MDD (major depressive disorder), recurrent, in partial remission   Axis II:No diagnosis  I discussed the assessment and treatment plan with the patient. The patient was provided an opportunity to ask questions and all were answered. The patient agreed with the plan and demonstrated an understanding of the instructions.  The patient was advised to call back or seek an in-person evaluation if the symptoms worsen or if the condition fails to improve as anticipated.  I provided13mnutes of non-face-to-face time during this encounter.  TLennox Grumbles LCSW  12/12/2020

## 2020-12-12 NOTE — Telephone Encounter (Signed)
Patient missed her OV with me on 12/05/20.  She needs to reschedule so we can address her anemia.

## 2020-12-12 NOTE — Telephone Encounter (Signed)
OV made and appt card mailed

## 2020-12-12 NOTE — Telephone Encounter (Signed)
Kara Knapp please reschedule pt

## 2020-12-20 ENCOUNTER — Other Ambulatory Visit (HOSPITAL_COMMUNITY): Payer: Self-pay

## 2020-12-20 DIAGNOSIS — Z992 Dependence on renal dialysis: Secondary | ICD-10-CM | POA: Diagnosis not present

## 2020-12-20 DIAGNOSIS — E538 Deficiency of other specified B group vitamins: Secondary | ICD-10-CM

## 2020-12-20 DIAGNOSIS — R7989 Other specified abnormal findings of blood chemistry: Secondary | ICD-10-CM

## 2020-12-21 ENCOUNTER — Other Ambulatory Visit: Payer: Self-pay

## 2020-12-21 ENCOUNTER — Inpatient Hospital Stay (HOSPITAL_COMMUNITY): Payer: Medicare Other | Attending: Hematology and Oncology

## 2020-12-21 ENCOUNTER — Telehealth: Payer: Self-pay

## 2020-12-21 DIAGNOSIS — D509 Iron deficiency anemia, unspecified: Secondary | ICD-10-CM | POA: Diagnosis present

## 2020-12-21 DIAGNOSIS — E538 Deficiency of other specified B group vitamins: Secondary | ICD-10-CM

## 2020-12-21 DIAGNOSIS — R7989 Other specified abnormal findings of blood chemistry: Secondary | ICD-10-CM

## 2020-12-21 LAB — CBC WITH DIFFERENTIAL/PLATELET
Abs Immature Granulocytes: 0.02 10*3/uL (ref 0.00–0.07)
Basophils Absolute: 0.1 10*3/uL (ref 0.0–0.1)
Basophils Relative: 1 %
Eosinophils Absolute: 0.1 10*3/uL (ref 0.0–0.5)
Eosinophils Relative: 2 %
HCT: 25.9 % — ABNORMAL LOW (ref 36.0–46.0)
Hemoglobin: 7.4 g/dL — ABNORMAL LOW (ref 12.0–15.0)
Immature Granulocytes: 0 %
Lymphocytes Relative: 10 %
Lymphs Abs: 0.7 10*3/uL (ref 0.7–4.0)
MCH: 24.3 pg — ABNORMAL LOW (ref 26.0–34.0)
MCHC: 28.6 g/dL — ABNORMAL LOW (ref 30.0–36.0)
MCV: 84.9 fL (ref 80.0–100.0)
Monocytes Absolute: 0.7 10*3/uL (ref 0.1–1.0)
Monocytes Relative: 11 %
Neutro Abs: 5.2 10*3/uL (ref 1.7–7.7)
Neutrophils Relative %: 76 %
Platelets: 188 10*3/uL (ref 150–400)
RBC: 3.05 MIL/uL — ABNORMAL LOW (ref 3.87–5.11)
RDW: 17.4 % — ABNORMAL HIGH (ref 11.5–15.5)
WBC: 6.8 10*3/uL (ref 4.0–10.5)
nRBC: 0 % (ref 0.0–0.2)

## 2020-12-21 LAB — IRON AND TIBC
Iron: 24 ug/dL — ABNORMAL LOW (ref 28–170)
Saturation Ratios: 4 % — ABNORMAL LOW (ref 10.4–31.8)
TIBC: 588 ug/dL — ABNORMAL HIGH (ref 250–450)
UIBC: 564 ug/dL

## 2020-12-21 LAB — VITAMIN B12: Vitamin B-12: 907 pg/mL (ref 180–914)

## 2020-12-21 LAB — FOLATE: Folate: 16.6 ng/mL (ref >5.9)

## 2020-12-21 LAB — FERRITIN: Ferritin: 18 ng/mL (ref 11–307)

## 2020-12-21 NOTE — Telephone Encounter (Signed)
error 

## 2020-12-27 DIAGNOSIS — N186 End stage renal disease: Secondary | ICD-10-CM | POA: Diagnosis not present

## 2020-12-27 DIAGNOSIS — Z992 Dependence on renal dialysis: Secondary | ICD-10-CM | POA: Diagnosis not present

## 2020-12-28 ENCOUNTER — Inpatient Hospital Stay (HOSPITAL_COMMUNITY): Payer: Medicare Other

## 2020-12-28 ENCOUNTER — Telehealth: Payer: Self-pay | Admitting: Gastroenterology

## 2020-12-28 ENCOUNTER — Other Ambulatory Visit: Payer: Self-pay

## 2020-12-28 ENCOUNTER — Ambulatory Visit (HOSPITAL_COMMUNITY): Payer: Medicare Other | Admitting: Hematology

## 2020-12-28 ENCOUNTER — Inpatient Hospital Stay (HOSPITAL_COMMUNITY): Payer: Medicare Other | Attending: Hematology | Admitting: Physician Assistant

## 2020-12-28 VITALS — HR 70 | Temp 96.8°F | Resp 17 | Wt 190.6 lb

## 2020-12-28 DIAGNOSIS — Z992 Dependence on renal dialysis: Secondary | ICD-10-CM | POA: Insufficient documentation

## 2020-12-28 DIAGNOSIS — D649 Anemia, unspecified: Secondary | ICD-10-CM

## 2020-12-28 DIAGNOSIS — D509 Iron deficiency anemia, unspecified: Secondary | ICD-10-CM | POA: Insufficient documentation

## 2020-12-28 DIAGNOSIS — Z7952 Long term (current) use of systemic steroids: Secondary | ICD-10-CM | POA: Diagnosis not present

## 2020-12-28 DIAGNOSIS — N186 End stage renal disease: Secondary | ICD-10-CM | POA: Insufficient documentation

## 2020-12-28 DIAGNOSIS — Z79899 Other long term (current) drug therapy: Secondary | ICD-10-CM | POA: Diagnosis not present

## 2020-12-28 DIAGNOSIS — I132 Hypertensive heart and chronic kidney disease with heart failure and with stage 5 chronic kidney disease, or end stage renal disease: Secondary | ICD-10-CM | POA: Diagnosis not present

## 2020-12-28 DIAGNOSIS — Z7951 Long term (current) use of inhaled steroids: Secondary | ICD-10-CM | POA: Insufficient documentation

## 2020-12-28 DIAGNOSIS — I5032 Chronic diastolic (congestive) heart failure: Secondary | ICD-10-CM | POA: Diagnosis not present

## 2020-12-28 DIAGNOSIS — Z794 Long term (current) use of insulin: Secondary | ICD-10-CM | POA: Diagnosis not present

## 2020-12-28 LAB — CBC WITH DIFFERENTIAL/PLATELET
Abs Immature Granulocytes: 0.06 10*3/uL (ref 0.00–0.07)
Basophils Absolute: 0.1 10*3/uL (ref 0.0–0.1)
Basophils Relative: 1 %
Eosinophils Absolute: 0.1 10*3/uL (ref 0.0–0.5)
Eosinophils Relative: 1 %
HCT: 24.8 % — ABNORMAL LOW (ref 36.0–46.0)
Hemoglobin: 7 g/dL — ABNORMAL LOW (ref 12.0–15.0)
Immature Granulocytes: 1 %
Lymphocytes Relative: 12 %
Lymphs Abs: 0.8 10*3/uL (ref 0.7–4.0)
MCH: 23.4 pg — ABNORMAL LOW (ref 26.0–34.0)
MCHC: 28.2 g/dL — ABNORMAL LOW (ref 30.0–36.0)
MCV: 82.9 fL (ref 80.0–100.0)
Monocytes Absolute: 0.5 10*3/uL (ref 0.1–1.0)
Monocytes Relative: 8 %
Neutro Abs: 4.9 10*3/uL (ref 1.7–7.7)
Neutrophils Relative %: 77 %
Platelets: 224 10*3/uL (ref 150–400)
RBC: 2.99 MIL/uL — ABNORMAL LOW (ref 3.87–5.11)
RDW: 17.4 % — ABNORMAL HIGH (ref 11.5–15.5)
WBC: 6.4 10*3/uL (ref 4.0–10.5)
nRBC: 0 % (ref 0.0–0.2)

## 2020-12-28 LAB — COMPREHENSIVE METABOLIC PANEL
ALT: 16 U/L (ref 0–44)
AST: 17 U/L (ref 15–41)
Albumin: 3.7 g/dL (ref 3.5–5.0)
Alkaline Phosphatase: 82 U/L (ref 38–126)
Anion gap: 9 (ref 5–15)
BUN: 41 mg/dL — ABNORMAL HIGH (ref 6–20)
CO2: 27 mmol/L (ref 22–32)
Calcium: 8.9 mg/dL (ref 8.9–10.3)
Chloride: 97 mmol/L — ABNORMAL LOW (ref 98–111)
Creatinine, Ser: 7.82 mg/dL — ABNORMAL HIGH (ref 0.44–1.00)
GFR, Estimated: 6 mL/min — ABNORMAL LOW (ref >60)
Glucose, Bld: 93 mg/dL (ref 70–99)
Potassium: 4.1 mmol/L (ref 3.5–5.1)
Sodium: 133 mmol/L — ABNORMAL LOW (ref 135–145)
Total Bilirubin: 0.6 mg/dL (ref 0.3–1.2)
Total Protein: 7.3 g/dL (ref 6.5–8.1)

## 2020-12-28 LAB — RETICULOCYTES
Immature Retic Fract: 28.7 % — ABNORMAL HIGH (ref 2.3–15.9)
RBC.: 2.96 MIL/uL — ABNORMAL LOW (ref 3.87–5.11)
Retic Count, Absolute: 62.5 10*3/uL (ref 19.0–186.0)
Retic Ct Pct: 2.1 % (ref 0.4–3.1)

## 2020-12-28 LAB — VITAMIN D 25 HYDROXY (VIT D DEFICIENCY, FRACTURES): Vit D, 25-Hydroxy: 44.99 ng/mL (ref 30–100)

## 2020-12-28 LAB — LACTATE DEHYDROGENASE: LDH: 150 U/L (ref 98–192)

## 2020-12-28 NOTE — Patient Instructions (Signed)
[place] at [facility] Discharge Instructions  You were seen today by Tarri Abernethy PA-C for your anmia and previously elevated iron (ferritin) Your iron levels today are actually low, and your blood counts are very low (hemoglobin 7.4).  I would like you to get some IV iron, and will coordinate this with your nephrology PA so that you can receive this during dialysis.  I will also check some labs today to rule out other causes of anemia.  I would like to recheck your blood counts and see you for follow up in 2 weeks.    I will also talk to your gastroenterology PA to see if they can schedule you for EGD and colonoscopy to look for possible blood loss in your intestines.   Thank you for choosing [place] at [facility] to provide your oncology and hematology care.  To afford each patient quality time with our provider, please arrive at least 15 minutes before your scheduled appointment time.   If you have a lab appointment with the [facility] please come in thru the Main Entrance and check in at the main information desk.  You need to re-schedule your appointment should you arrive 10 or more minutes late.  We strive to give you quality time with our providers, and arriving late affects you and other patients whose appointments are after yours.  Also, if you no show three or more times for appointments you may be dismissed from the clinic at the providers discretion.     Again, thank you for choosing [facility].  Our hope is that these requests will decrease the amount of time that you wait before being seen by our physicians.       _____________________________________________________________  Should you have questions after your visit to [facility], please contact our office at [phone] and follow the prompts.  Our office hours are 8:00 a.m. and 4:30 p.m. Monday - Friday.  Please note that voicemails left  after 4:00 p.m. may not be returned until the following business day.  We are closed weekends and major holidays.  You do have access to a nurse 24-7, just call the main number to the clinic [phone] and do not press any options, hold on the line and a nurse will answer the phone.    For prescription refill requests, have your pharmacy contact our office and allow 72 hours.    Due to Covid, you will need to wear a mask upon entering the hospital. If you do not have a mask, a mask will be given to you at the Main Entrance upon arrival. For doctor visits, patients may have 1 support person age 44 or older with them. For treatment visits, patients can not have anyone with them due to social distancing guidelines and our immunocompromised population.

## 2020-12-28 NOTE — Progress Notes (Addendum)
[place], [place] [address]   CLINIC:  Medical Oncology/Hematology  PCP:  Medicine, [facility] [address] [place] [address] [phone]   REASON FOR VISIT:  Follow-up for iron-deficiency anemia and previously elevated ferritin  CURRENT THERAPY: Observation  INTERVAL HISTORY:  Ms. Hegwood 50 y.o. female returns for routine follow-up of her previously elevated ferritin, as well as her current iron deficiency anemia.  Ms. Hubka was initially referred to hematology in August 2019 due to elevated ferritin.  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  Liver biopsy on 02/07/2018 confirmed secondary iron overload.  Since that time, she has not received any IV iron and previous labs have shown correction of her ferritin levels. Most recent labs show progressive anemia with Hgb 7.4/MCV 84.9, ferritin 18, and iron saturation 4%.  She is also being seen by gastroenterology Neil Crouch, PA-C) for her liver cirrhosis as well as nephrology [name], PA-C) for her dialysis-dependent kidney failure.  She has apparently been receiving 12,000 units of Epogen with dialysis treatments, but without improvement in her blood counts.  Per GI telephone note Neil Crouch PA-C on 11/25/2020), they are considering EGD/colonoscopy to evaluate for sources of blood loss, but have been hesitant to give IV iron due to her history of secondary hemochromatosis.  She had not noticed any recent bleeding such as epistaxis, hematuria, hematochezia, or melena. - HOWEVER, she is extremely visually impaired and cannot see or describe her bowel movements.  She has baseline shortness or breath and cough due to her COPD.  She denies chest pain, palpitations, or syncopal episodes. She denies any pica and eats a variety of diet.  The patient denies over the  counter NSAID ingestion. She is not on antiplatelets agents.  She takes daily Protonix.  The patient does not take any iron supplement. Her last EGD and colonoscopy were performed on 10/02/2016, and findings showed multiple polyps and internal hemorrhoids on colonoscopy, and EGD with moderate gastritis and mild duodenitis.  She received blood transfusion several years ago due to bleeding from internal hemorrhoids.  She has 25% energy and 75% appetite. She endorses that she is maintaining a stable weight.    REVIEW OF SYSTEMS:  Review of Systems  Constitutional: Positive for appetite change (75%) and fatigue (25%). Negative for chills, diaphoresis, fever and unexpected weight change.  HENT:   Positive for trouble swallowing (History of esophageal stricture). Negative for lump/mass and nosebleeds.   Eyes: Negative for eye problems.  Respiratory: Positive for cough, shortness of breath and wheezing. Negative for hemoptysis.        Baseline respiratory status with underlying COPD  Cardiovascular: Negative for chest pain, leg swelling and palpitations.  Gastrointestinal: Positive for constipation. Negative for abdominal pain, blood in stool, diarrhea, nausea and vomiting.  Genitourinary: Negative for hematuria.   Skin: Negative.   Neurological: Positive for numbness (Chronic neuropathy). Negative for dizziness, headaches and light-headedness.  Hematological: Does not bruise/bleed easily.  Psychiatric/Behavioral: Positive for sleep disturbance.      PAST MEDICAL/SURGICAL HISTORY:  Past Medical History:  Diagnosis Date  . Anemia of chronic disease   . Anxiety   . Asthma   . Blind left eye   . Bronchitis   . Cataract   . Cholecystitis, acute 05/26/2013   Status post cholecystectomy  . Chronic abdominal pain   . Chronic diarrhea   .  COPD (chronic obstructive pulmonary disease) ([place])   . Depression   . Diabetic foot ulcer ([place]) 03/01/2015  . Diastolic heart failure ([place])   . ESRD on  hemodialysis ([place])    Started diaylsis 12/29/15  . Essential hypertension   . Fibroids   . Glaucoma   . History of blood transfusion   . History of pneumonia   . Hyperlipidemia   . Insulin-dependent diabetes mellitus with retinopathy   . Liver fibrosis    Negative Hep B surface antigen, negative Hep C antibody Feb 2018 (see scanned in labs).  . Neuropathy   . Osteomyelitis ([place])    Toe on left foot   Past Surgical History:  Procedure Laterality Date  . A/V SHUNTOGRAM N/A 10/25/2016   Procedure: A/V Shuntogram - Right Arm;  Surgeon: Waynetta Sandy, MD;  Location: [facility];  Service: Cardiovascular;  Laterality: N/A;  . A/V SHUNTOGRAM N/A 03/05/2018   Procedure: A/V SHUNTOGRAM - Right Arm;  Surgeon: Waynetta Sandy, MD;  Location: [facility];  Service: Cardiovascular;  Laterality: N/A;  . AV FISTULA PLACEMENT Right 10/17/2015   Procedure: INSERTION OF ARTERIOVENOUS GORE-TEX GRAFT RIGHT UPPER ARM WITH ACUSEAL;  Surgeon: Conrad Fithian, MD;  Location: [facility];  Service: Vascular;  Laterality: Right;  . AV FISTULA PLACEMENT Left 11/18/2019   Procedure: INSERTION OF ARTERIOVENOUS (AV) GORE-TEX GRAFT left  ARM;  Surgeon: Serafina Mitchell, MD;  Location: [place] [facility];  Service: Vascular;  Laterality: Left;  . CATARACT EXTRACTION W/ INTRAOCULAR LENS IMPLANT Bilateral   . CESAREAN SECTION    . CHOLECYSTECTOMY N/A 05/25/2013   Procedure: LAPAROSCOPIC CHOLECYSTECTOMY;  Surgeon: Jamesetta So, MD;  Location: [facility];  Service: General;  Laterality: N/A;  . COLONOSCOPY WITH PROPOFOL N/A 10/02/2016   two 3 to 5 mm polyps in the descending colon, three 2 to 3 mm polyps in the rectum, random colon biopsies, rectal bleeding due to internal hemorrhoids, friability with no bleeding at the anus status post biopsy.  Surgical pathology found the polyps to be a mix of hyperplastic and tubular adenoma, random colon biopsies to be benign colonic mucosa, and the anal biopsies to be anal skin  tag.   Marland Kitchen ESOPHAGOGASTRODUODENOSCOPY (EGD) WITH PROPOFOL N/A 10/02/2016   mucosal nodule in the esophagus status post biopsy, moderate gastritis status post biopsy, mild duodenitis. esophageal biopsy to be benign, gastric biopsies to be gastritis due to aspirin use, and duodenal biopsies to be duodenitis due to aspirin use  . EYE SURGERY Bilateral   . IR FLUORO GUIDE CV LINE RIGHT  10/09/2019  . IR REMOVAL TUN CV CATH W/O FL  01/15/2020  . IR THROMBECTOMY AV FISTULA W/THROMBOLYSIS/PTA INC/SHUNT/IMG RIGHT Right 12/26/2018  . IR THROMBECTOMY AV FISTULA W/THROMBOLYSIS/PTA INC/SHUNT/IMG RIGHT Right 08/10/2019  . IR THROMBECTOMY AV FISTULA W/THROMBOLYSIS/PTA/STENT INC/SHUNT/IMG RT Right 08/08/2018  . IR THROMBECTOMY AV FISTULA W/THROMBOLYSIS/PTA/STENT INC/SHUNT/IMG RT Right 05/15/2019  . IR US GUIDE VASC ACCESS RIGHT  08/08/2018  . IR US GUIDE VASC ACCESS RIGHT  12/26/2018  . IR US GUIDE VASC ACCESS RIGHT  05/15/2019  . IR US GUIDE VASC ACCESS RIGHT  08/10/2019  . IR US GUIDE VASC ACCESS RIGHT  10/09/2019  . PARS PLANA VITRECTOMY Left 11/24/2014   Procedure: PARS PLANA VITRECTOMY WITH 25 GAUGE;  Surgeon: Hurman Horn, MD;  Location: [facility];  Service: Ophthalmology;  Laterality: Left;  . PERIPHERAL VASCULAR BALLOON ANGIOPLASTY Right 03/05/2018   Procedure: PERIPHERAL VASCULAR BALLOON ANGIOPLASTY;  Surgeon: Donzetta Matters,  Georgia Dom, MD;  Location: [facility];  Service: Cardiovascular;  Laterality: Right;  arm fistula  . PERIPHERAL VASCULAR CATHETERIZATION N/A 04/28/2015   Procedure: Bilateral Upper Extremity Venography;  Surgeon: Conrad Lewiston, MD;  Location: [facility];  Service: Cardiovascular;  Laterality: N/A;  . PHOTOCOAGULATION WITH LASER Left 11/24/2014   Procedure: PHOTOCOAGULATION WITH LASER;  Surgeon: Hurman Horn, MD;  Location: [facility];  Service: Ophthalmology;  Laterality: Left;  with insertion of silicone oil  . SAVORY DILATION N/A 10/02/2016   Procedure: SAVORY DILATION;  Surgeon: Danie Binder, MD;  [facility];  Service: Endoscopy;  Laterality: N/A;  . TUBAL LIGATION    . UPPER EXTREMITY VENOGRAPHY Bilateral 11/02/2019   Procedure: UPPER EXTREMITY VENOGRAPHY;  Surgeon: Waynetta Sandy, MD;  Location: [facility];  Service: Cardiovascular;  Laterality: Bilateral;     SOCIAL HISTORY:  Social History   Socioeconomic History  . Marital status: Single    Spouse name: Not on file  . Number of children: 5  . Years of education: GED  . Highest education level: Not on file  Occupational History  . Not on file  Tobacco Use  . Smoking status: Current Every Day Smoker    Packs/day: 1.00    Years: 23.00    Pack years: 23.00    Types: Cigarettes    Start date: 12/03/2000  . Smokeless tobacco: Never Used  . Tobacco comment: one pack daily  Vaping Use  . Vaping Use: Never used  Substance and Sexual Activity  . Alcohol use: No    Alcohol/week: 0.0 standard drinks  . Drug use: No    Comment: Sober for 8 years  . Sexual activity: Not Currently    Birth control/protection: Surgical  Other Topics Concern  . Not on file  Social History Narrative  . Not on file   Social Determinants of Health   Financial Resource Strain: Not on file  Food Insecurity: Not on file  Transportation Needs: Not on file  Physical Activity: Not on file  Stress: Not on file  Social Connections: Not on file  Intimate Partner Violence: Not on file    FAMILY HISTORY:  Family History  Problem Relation Age of Onset  . COPD Mother   . Cancer Father   . Lymphoma Father   . Diabetes Sister   . Deep vein thrombosis Sister   . Diabetes Brother   . Hyperlipidemia Brother   . Hypertension Brother   . Mental retardation Sister   . Alcohol abuse Paternal Grandmother   . Colon cancer Neg Hx   . Liver disease Neg Hx     CURRENT MEDICATIONS:  Outpatient Encounter Medications as of 12/28/2020  Medication Sig  . acetaminophen (TYLENOL) 500 MG tablet Take 1,000 mg by  mouth every 6 (six) hours as needed for moderate pain or headache.  . ARIPiprazole (ABILIFY) 5 MG tablet Take 1 tablet (5 mg total) by mouth daily.  Marland Kitchen azelastine (ASTELIN) 0.1 % nasal spray Place 1 spray into both nostrils 2 (two) times daily. Use in each nostril as directed (Patient not taking: Reported on 10/05/2020)  . carvedilol (COREG) 12.5 MG tablet Take 1 tablet (12.5 mg total) by mouth 2 (two) times daily.  . cholecalciferol (VITAMIN D) 1000 units tablet Take 1,000 Units by mouth daily.   . COMBIGAN 0.2-0.5 % ophthalmic solution INSTILL ONE DROP IN [facility] EYE TWICE DAILY (Patient taking differently: 1 drop once.)  .  Continuous Blood Gluc Sensor (FREESTYLE LIBRE 14 DAY SENSOR) MISC Inject 1 each into the skin every 14 (fourteen) days. Use as directed.  . cyclobenzaprine (FLEXERIL) 5 MG tablet TAKE ONE TABLET BY MOUTH THREE TIMES DAILY AS NEEDED FOR MUSCLE SPASM  . dexamethasone (DECADRON) 4 MG tablet Take 1 tablet (4 mg total) by mouth daily with breakfast.  . doxycycline (VIBRA-TABS) 100 MG tablet Take 1 tablet (100 mg total) by mouth 2 (two) times daily.  Marland Kitchen gabapentin (NEURONTIN) 100 MG capsule Take 1 capsule (100 mg total) by mouth 3 (three) times daily. (Needs to be seen before next refill)  . glucose blood (PRODIGY NO CODING BLOOD GLUC) test strip USE TO CHECK BLOOD SUGAR TWICE DAILY. Dx E11.22 (Patient not taking: Reported on 10/05/2020)  . hydrALAZINE (APRESOLINE) 50 MG tablet Take 1 tablet (50 mg total) by mouth 2 (two) times daily.  . Insulin Pen Needle (TRUEPLUS PEN NEEDLES) 31G X 5 MM MISC Use daily with insulin Dx E11.22  . LANTUS SOLOSTAR 100 UNIT/ML Solostar Pen INJECT 18-50 UNITS SUBCUTANEOUSLY AT BEDTIME. (Patient taking differently: Inject 20 Units into the skin at bedtime.)  . linagliptin (TRADJENTA) 5 MG TABS tablet Take 1 tablet (5 mg total) by mouth daily. (Patient taking differently: Take 5 mg by mouth every evening.)  . multivitamin (RENA-VIT) TABS tablet Take 1 tablet by  mouth daily. (Patient not taking: Reported on 10/05/2020)  . pantoprazole (PROTONIX) 40 MG tablet TAKE 1 BY MOUTH DAILY 30 minutes before breakfast (Patient taking differently: Take 40 mg by mouth daily.)  . Probiotic Product (CULTURELLE PROBIOTICS PO) Take 1 capsule by mouth daily.   Marland Kitchen PRODIGY TWIST TOP LANCETS 28G MISC USE TO CHECK BLOOD SUGAR UP TO FOUR TIMES DAILY.  Marland Kitchen sertraline (ZOLOFT) 100 MG tablet Take 1.5 tablets (150 mg total) by mouth daily.  . sevelamer carbonate (RENVELA) 800 MG tablet Take 800-2,400 mg by mouth See admin instructions. Take 244m by mouth three times daily with means and 805mwith snacks  . SYMBICORT 160-4.5 MCG/ACT inhaler Inhale 2 puffs into the lungs 2 (two) times daily.  . VENTOLIN HFA 108 (90 Base) MCG/ACT inhaler Inhale 2 puffs into the lungs every 4 (four) hours as needed for wheezing or shortness of breath.   No facility-administered encounter medications on file as of 12/28/2020.    ALLERGIES:  Allergies  Allergen Reactions  . Sulfamethoxazole-Trimethoprim Nausea And Vomiting  . Bactrim [Sulfamethoxazole-Trimethoprim] Nausea And Vomiting  . Prednisone Other (See Comments)    "I was wide open and couldn't eat" per pt. Loss of appetite and insomnia  LOSS OF APPETITE,UNABLE TO SLEEP     PHYSICAL EXAM:  ECOG PERFORMANCE STATUS: 2 - Symptomatic, <50% confined to bed  There were no vitals filed for this visit. There were no vitals filed for this visit. Physical Exam Constitutional:      Appearance: Normal appearance. She is obese.  HENT:     Head: Normocephalic and atraumatic.     Mouth/Throat:     Mouth: Mucous membranes are moist.  Eyes:     Extraocular Movements: Extraocular movements intact.     Pupils: Pupils are equal, round, and reactive to light.     Comments: Visual impairment  Cardiovascular:     Rate and Rhythm: Normal rate and regular rhythm.     Pulses: Normal pulses.     Heart sounds: Normal heart sounds.  Pulmonary:     Effort:  Pulmonary effort is normal.     Breath sounds: Wheezing  present.  Abdominal:     General: Bowel sounds are normal.     Palpations: Abdomen is soft.     Tenderness: There is no abdominal tenderness.  Musculoskeletal:        General: No swelling.     Right lower leg: No edema.     Left lower leg: No edema.  Lymphadenopathy:     Cervical: No cervical adenopathy.  Skin:    General: Skin is warm and dry.     Comments: Hyperpigmentation  Neurological:     General: No focal deficit present.     Mental Status: She is alert and oriented to person, place, and time.  Psychiatric:        Mood and Affect: Mood normal.        Behavior: Behavior normal.      LABORATORY DATA:  I have reviewed the labs as listed.  CBC    Component Value Date/Time   WBC 6.8 12/21/2020 1044   RBC 3.05 (L) 12/21/2020 1044   HGB 7.4 (L) 12/21/2020 1044   HGB 12.1 05/14/2018 1357   HCT 25.9 (L) 12/21/2020 1044   HCT 35.9 05/14/2018 1357   PLT 188 12/21/2020 1044   PLT 171 05/14/2018 1357   MCV 84.9 12/21/2020 1044   MCV 99 (H) 05/14/2018 1357   MCH 24.3 (L) 12/21/2020 1044   MCHC 28.6 (L) 12/21/2020 1044   RDW 17.4 (H) 12/21/2020 1044   RDW 12.6 05/14/2018 1357   LYMPHSABS 0.7 12/21/2020 1044   LYMPHSABS 1.1 05/14/2018 1357   MONOABS 0.7 12/21/2020 1044   EOSABS 0.1 12/21/2020 1044   EOSABS 0.1 05/14/2018 1357   BASOSABS 0.1 12/21/2020 1044   BASOSABS 0.1 05/14/2018 1357   CMP Latest Ref Rng & Units 10/06/2020 10/05/2020 06/15/2020  Glucose 70 - 99 mg/dL 297(H) 115(H) 139(H)  BUN 6 - 20 mg/dL 44(H) 30(H) 32(H)  Creatinine 0.44 - 1.00 mg/dL 9.03(H) 7.32(H) 6.52(H)  Sodium 135 - 145 mmol/L 133(L) 136 140  Potassium 3.5 - 5.1 mmol/L 4.9 4.1 4.5  Chloride 98 - 111 mmol/L 97(L) 98 98  CO2 22 - 32 mmol/L 24 26 28   Calcium 8.9 - 10.3 mg/dL 8.5(L) 8.8(L) 9.3  Total Protein 6.5 - 8.1 g/dL - 7.0 6.9  Total Bilirubin 0.3 - 1.2 mg/dL - 0.6 0.4  Alkaline Phos 38 - 126 U/L - 84 -  AST 15 - 41 U/L - 23 20   ALT 0 - 44 U/L - 25 14    DIAGNOSTIC IMAGING:  I have independently reviewed the relevant imaging and discussed with the patient.   ASSESSMENT: 1.  Elevated ferritin due to secondary hemochromatosis - She was previously tested for hemochromatosis mutation panel which was negative. -Liver biopsy on 02/07/2018 showed secondary iron overload.  She was receiving iron infusions at dialysis. - She has not received any parenteral iron therapy in the past 2-3 years.  - She denies any family history of hemochromatosis. - Labs today showed iron depletion with iron saturation 4% and ferritin 18  2.  Iron deficiency anemia - Most recent labs (12/21/2020) show if again normocytic anemia with Hgb 7.4/MCV 84.9, 1318, and iron saturation 4% - High suspicion for GI blood loss, although patient is unable to see what her stool looks like due to severe visual impairment - Also has an element of anemia of ESRD, receiving 12,000 units of Epogen with dialysis treatments - Her last EGD and colonoscopy were performed on 10/02/2016, and findings showed multiple polyps and  internal hemorrhoids on colonoscopy, and EGD with moderate gastritis and mild duodenitis. - Received blood transfusion several years ago due to bleeding from unspecified source, possibly from internal hemorrhoids   PLAN:  1.  Elevated ferritin due to secondary hemochromatosis - Elevated ferritin 2019 due to secondary hemochromatosis in the setting of IV iron transfusions and underlying fatty liver disease/early cirrhosis - Most recent ferritin 18, no current concern for iron overload - Recommend IV iron infusions for anemia as below, with goal ferritin 100-200 - Will avoid excessive iron infusions which could cause recurrent iron overload  2.  Iron deficiency anemia - Most recent labs (12/21/2020) show if again normocytic anemia with Hgb 7.4/MCV 84.9, 1318, and iron saturation 4% - High suspicion for GI blood loss, although patient is unable to  see what her stool looks like due to severe visual impairment (also unable to obtain Hemoccult stool samples at home) - I have discussed with patient's GI provider Neil Crouch, PA-C) to recommend that the patient to be brought in for EGD/colonoscopy - May also have an aspect of anemia related to ESRD - has been receiving 12,000 units of Epogen with dialysis treatments - Recommend IV Venofer 500 mg in divided doses, recheck in 8 weeks before deciding on any further IV iron, due to history of secondary hemochromatosis - I have reached out to the patient's nephrology provider [name], PA-C), awaiting return message, to see if IV iron can be given during dialysis - We will check for other causes of anemia with nutritional panel, myeloma panel, reticulocytes, and erythropoietin - RTC in 2 weeks for repeat CBC and follow-up visit   PLAN SUMMARY & DISPOSITION: - Discussed with GI: Recommend EGD and colonoscopy for possible GI blood loss - Reached out to nephrology: Recommend IV Venofer 500 mg - We will repeat CBC and work-up for other causes of anemia today - RTC in 2 weeks for repeat CBC and follow-up visit  All questions were answered. The patient knows to call the clinic with any problems, questions or concerns.  Medical decision making: Moderate  Time spent on visit: I spent 25 minutes counseling the patient face to face. The total time spent in the appointment was 40 minutes and more than 50% was on counseling.   Harriett Rush, PA-C  12/28/20 12:44 PM   ADDENDUM:  Repeat CBC today showed Hgb 7.0 - recommend PRBC x 1.  Has attempted to reach patient's nephrologist to see if she can receive transfusion during dialysis tomorrow. Otherwise, we will bring her back here on Friday for type/screen and PRBC x1.

## 2020-12-28 NOTE — Telephone Encounter (Signed)
Please try to move patient's appointment up or add to cancellation list.  She is requiring blood transfusions and iron infusions. Needs to be seen in the next 2-3 weeks.

## 2020-12-29 LAB — KAPPA/LAMBDA LIGHT CHAINS
Kappa free light chain: 123.2 mg/L — ABNORMAL HIGH (ref 3.3–19.4)
Kappa, lambda light chain ratio: 0.87 (ref 0.26–1.65)
Lambda free light chains: 142.1 mg/L — ABNORMAL HIGH (ref 5.7–26.3)

## 2020-12-29 LAB — PROTEIN ELECTROPHORESIS, SERUM
A/G Ratio: 1.2 (ref 0.7–1.7)
Albumin ELP: 3.8 g/dL (ref 2.9–4.4)
Alpha-1-Globulin: 0.2 g/dL (ref 0.0–0.4)
Alpha-2-Globulin: 0.7 g/dL (ref 0.4–1.0)
Beta Globulin: 1 g/dL (ref 0.7–1.3)
Gamma Globulin: 1.1 g/dL (ref 0.4–1.8)
Globulin, Total: 3.1 g/dL (ref 2.2–3.9)
Total Protein ELP: 6.9 g/dL (ref 6.0–8.5)

## 2020-12-29 LAB — COPPER, SERUM: Copper: 124 ug/dL (ref 80–158)

## 2020-12-30 ENCOUNTER — Telehealth (HOSPITAL_COMMUNITY): Payer: Self-pay | Admitting: *Deleted

## 2020-12-30 ENCOUNTER — Other Ambulatory Visit (HOSPITAL_COMMUNITY): Payer: Self-pay | Admitting: *Deleted

## 2020-12-30 DIAGNOSIS — D649 Anemia, unspecified: Secondary | ICD-10-CM

## 2020-12-30 LAB — IMMUNOFIXATION ELECTROPHORESIS
IgA: 186 mg/dL (ref 87–352)
IgG (Immunoglobin G), Serum: 1138 mg/dL (ref 586–1602)
IgM (Immunoglobulin M), Srm: 139 mg/dL (ref 26–217)
Total Protein ELP: 6.8 g/dL (ref 6.0–8.5)

## 2020-12-30 LAB — ERYTHROPOIETIN: Erythropoietin: 381.5 m[IU]/mL — ABNORMAL HIGH (ref 2.6–18.5)

## 2020-12-30 NOTE — Telephone Encounter (Signed)
Patient made ware that she is in need of a blood transfusion.  She states having transportation issues, as she utilizes [facility].  The earliest she can come for her labs is Wednesday 6/8. Has dialysis on 6/9 and will come here for transfusion on 6/10.  She verbalizes understanding that if she were to become symptomatic at any point prior to appointments she will need to go to the Emergency room for treatment.  At this time she denies any complaints.

## 2020-12-30 NOTE — Progress Notes (Signed)
Severe normocytic anemia noted with Hgb 7.0.  Patient has been called with these results and we are working to set her up for a blood transfusion.  She reports that she is unable to come into our clinic until next week due to transportation issues.  She is currently asymptomatic, but has been told to proceed to the emergency department if she experiences any dizziness, lightheadedness, chest pain, dyspnea, palpitations, or syncopal episodes.  We have also been in contact with her dialysis center, who will repeat her CBC at her next treatment.  If her repeat CBC shows Hgb less than 7.0, they will send her to the ED for immediate transfusion.

## 2020-12-31 ENCOUNTER — Other Ambulatory Visit: Payer: Self-pay

## 2020-12-31 ENCOUNTER — Encounter (HOSPITAL_COMMUNITY): Payer: Self-pay | Admitting: Emergency Medicine

## 2020-12-31 ENCOUNTER — Observation Stay (HOSPITAL_COMMUNITY)
Admission: EM | Admit: 2020-12-31 | Discharge: 2021-01-02 | Disposition: A | Discharge status: Home / Self Care | Payer: Medicare Other | Attending: Internal Medicine | Admitting: Internal Medicine

## 2020-12-31 ENCOUNTER — Emergency Department (HOSPITAL_COMMUNITY): Payer: Medicare Other

## 2020-12-31 DIAGNOSIS — I5032 Chronic diastolic (congestive) heart failure: Secondary | ICD-10-CM | POA: Diagnosis present

## 2020-12-31 DIAGNOSIS — K2970 Gastritis, unspecified, without bleeding: Secondary | ICD-10-CM | POA: Diagnosis not present

## 2020-12-31 DIAGNOSIS — B3781 Candidal esophagitis: Secondary | ICD-10-CM | POA: Diagnosis not present

## 2020-12-31 DIAGNOSIS — I10 Essential (primary) hypertension: Secondary | ICD-10-CM | POA: Diagnosis not present

## 2020-12-31 DIAGNOSIS — N186 End stage renal disease: Secondary | ICD-10-CM

## 2020-12-31 DIAGNOSIS — I132 Hypertensive heart and chronic kidney disease with heart failure and with stage 5 chronic kidney disease, or end stage renal disease: Secondary | ICD-10-CM | POA: Diagnosis not present

## 2020-12-31 DIAGNOSIS — D509 Iron deficiency anemia, unspecified: Secondary | ICD-10-CM | POA: Diagnosis not present

## 2020-12-31 DIAGNOSIS — Z7984 Long term (current) use of oral hypoglycemic drugs: Secondary | ICD-10-CM | POA: Diagnosis not present

## 2020-12-31 DIAGNOSIS — Z20822 Contact with and (suspected) exposure to covid-19: Secondary | ICD-10-CM | POA: Diagnosis not present

## 2020-12-31 DIAGNOSIS — E1122 Type 2 diabetes mellitus with diabetic chronic kidney disease: Secondary | ICD-10-CM | POA: Diagnosis present

## 2020-12-31 DIAGNOSIS — Z992 Dependence on renal dialysis: Secondary | ICD-10-CM | POA: Diagnosis not present

## 2020-12-31 DIAGNOSIS — F17210 Nicotine dependence, cigarettes, uncomplicated: Secondary | ICD-10-CM | POA: Insufficient documentation

## 2020-12-31 DIAGNOSIS — J45909 Unspecified asthma, uncomplicated: Secondary | ICD-10-CM | POA: Insufficient documentation

## 2020-12-31 DIAGNOSIS — Z794 Long term (current) use of insulin: Secondary | ICD-10-CM | POA: Diagnosis not present

## 2020-12-31 DIAGNOSIS — Z79899 Other long term (current) drug therapy: Secondary | ICD-10-CM | POA: Diagnosis not present

## 2020-12-31 DIAGNOSIS — I517 Cardiomegaly: Secondary | ICD-10-CM | POA: Diagnosis not present

## 2020-12-31 DIAGNOSIS — D649 Anemia, unspecified: Secondary | ICD-10-CM | POA: Diagnosis present

## 2020-12-31 DIAGNOSIS — H548 Legal blindness, as defined in USA: Secondary | ICD-10-CM | POA: Diagnosis present

## 2020-12-31 DIAGNOSIS — R0602 Shortness of breath: Secondary | ICD-10-CM | POA: Diagnosis not present

## 2020-12-31 DIAGNOSIS — J449 Chronic obstructive pulmonary disease, unspecified: Secondary | ICD-10-CM | POA: Diagnosis not present

## 2020-12-31 DIAGNOSIS — K7460 Unspecified cirrhosis of liver: Secondary | ICD-10-CM | POA: Diagnosis present

## 2020-12-31 DIAGNOSIS — R42 Dizziness and giddiness: Secondary | ICD-10-CM | POA: Diagnosis not present

## 2020-12-31 LAB — POC OCCULT BLOOD, ED: Fecal Occult Bld: NEGATIVE

## 2020-12-31 LAB — COMPREHENSIVE METABOLIC PANEL
ALT: 14 U/L (ref 0–44)
AST: 15 U/L (ref 15–41)
Albumin: 3.5 g/dL (ref 3.5–5.0)
Alkaline Phosphatase: 82 U/L (ref 38–126)
Anion gap: 9 (ref 5–15)
BUN: 32 mg/dL — ABNORMAL HIGH (ref 6–20)
CO2: 28 mmol/L (ref 22–32)
Calcium: 9 mg/dL (ref 8.9–10.3)
Chloride: 98 mmol/L (ref 98–111)
Creatinine, Ser: 7.47 mg/dL — ABNORMAL HIGH (ref 0.44–1.00)
GFR, Estimated: 6 mL/min — ABNORMAL LOW (ref >60)
Glucose, Bld: 145 mg/dL — ABNORMAL HIGH (ref 70–99)
Potassium: 4 mmol/L (ref 3.5–5.1)
Sodium: 135 mmol/L (ref 135–145)
Total Bilirubin: 0.6 mg/dL (ref 0.3–1.2)
Total Protein: 6.8 g/dL (ref 6.5–8.1)

## 2020-12-31 LAB — IRON AND TIBC
Iron: 27 ug/dL — ABNORMAL LOW (ref 28–170)
Saturation Ratios: 5 % — ABNORMAL LOW (ref 10.4–31.8)
TIBC: 584 ug/dL — ABNORMAL HIGH (ref 250–450)
UIBC: 557 ug/dL

## 2020-12-31 LAB — CBC
HCT: 23 % — ABNORMAL LOW (ref 36.0–46.0)
Hemoglobin: 6.6 g/dL — CL (ref 12.0–15.0)
MCH: 23.8 pg — ABNORMAL LOW (ref 26.0–34.0)
MCHC: 28.7 g/dL — ABNORMAL LOW (ref 30.0–36.0)
MCV: 83 fL (ref 80.0–100.0)
Platelets: 211 10*3/uL (ref 150–400)
RBC: 2.77 MIL/uL — ABNORMAL LOW (ref 3.87–5.11)
RDW: 17.8 % — ABNORMAL HIGH (ref 11.5–15.5)
WBC: 6.5 10*3/uL (ref 4.0–10.5)
nRBC: 0 % (ref 0.0–0.2)

## 2020-12-31 LAB — ABO/RH: ABO/RH(D): O POS

## 2020-12-31 LAB — HEMOGLOBIN AND HEMATOCRIT, BLOOD
HCT: 27.8 % — ABNORMAL LOW (ref 36.0–46.0)
Hemoglobin: 8.2 g/dL — ABNORMAL LOW (ref 12.0–15.0)

## 2020-12-31 LAB — RETICULOCYTES
Immature Retic Fract: 37.5 % — ABNORMAL HIGH (ref 2.3–15.9)
RBC.: 2.98 MIL/uL — ABNORMAL LOW (ref 3.87–5.11)
Retic Count, Absolute: 84.6 10*3/uL (ref 19.0–186.0)
Retic Ct Pct: 2.8 % (ref 0.4–3.1)

## 2020-12-31 LAB — FERRITIN: Ferritin: 45 ng/mL (ref 11–307)

## 2020-12-31 LAB — RESP PANEL BY RT-PCR (FLU A&B, COVID) ARPGX2
Influenza A by PCR: NEGATIVE
Influenza B by PCR: NEGATIVE
SARS Coronavirus 2 by RT PCR: NEGATIVE

## 2020-12-31 LAB — PREPARE RBC (CROSSMATCH)

## 2020-12-31 LAB — FOLATE: Folate: 18.6 ng/mL (ref >5.9)

## 2020-12-31 LAB — GLUCOSE, CAPILLARY: Glucose-Capillary: 124 mg/dL — ABNORMAL HIGH (ref 70–99)

## 2020-12-31 LAB — METHYLMALONIC ACID, SERUM: Methylmalonic Acid, Quantitative: 490 nmol/L — ABNORMAL HIGH (ref 0–378)

## 2020-12-31 LAB — VITAMIN B12: Vitamin B-12: 898 pg/mL (ref 180–914)

## 2020-12-31 MED ORDER — HYDRALAZINE HCL 25 MG PO TABS
50.0000 mg | ORAL_TABLET | Freq: Two times a day (BID) | ORAL | Status: DC
  Administered 2020-12-31 – 2021-01-02 (×4): 50 mg via ORAL
  Filled 2020-12-31 (×4): qty 2

## 2020-12-31 MED ORDER — ONDANSETRON HCL 4 MG PO TABS: 4.0000 mg | ORAL_TABLET | Freq: Four times a day (QID) | ORAL | Status: DC | PRN

## 2020-12-31 MED ORDER — SEVELAMER CARBONATE 800 MG PO TABS
2400.0000 mg | ORAL_TABLET | Freq: Three times a day (TID) | ORAL | Status: DC
  Administered 2021-01-01 – 2021-01-02 (×3): 2400 mg via ORAL
  Filled 2020-12-31 (×5): qty 3

## 2020-12-31 MED ORDER — INSULIN ASPART 100 UNIT/ML IJ SOLN
0.0000 [IU] | Freq: Four times a day (QID) | INTRAMUSCULAR | Status: DC
  Administered 2020-12-31: 1 [IU] via SUBCUTANEOUS
  Administered 2021-01-01: 2 [IU] via SUBCUTANEOUS

## 2020-12-31 MED ORDER — SEVELAMER CARBONATE 800 MG PO TABS: 800.0000 mg | ORAL_TABLET | ORAL | Status: DC

## 2020-12-31 MED ORDER — SODIUM CHLORIDE 0.9% IV SOLUTION
Freq: Once | INTRAVENOUS | Status: DC
Start: 2020-12-31 — End: 2020-12-31

## 2020-12-31 MED ORDER — ARIPIPRAZOLE 5 MG PO TABS
5.0000 mg | ORAL_TABLET | Freq: Every day | ORAL | Status: DC
  Administered 2021-01-01 – 2021-01-02 (×2): 5 mg via ORAL
  Filled 2020-12-31 (×3): qty 1

## 2020-12-31 MED ORDER — SERTRALINE HCL 50 MG PO TABS
150.0000 mg | ORAL_TABLET | Freq: Every day | ORAL | Status: DC
  Administered 2020-12-31 – 2021-01-01 (×2): 150 mg via ORAL
  Filled 2020-12-31 (×2): qty 3

## 2020-12-31 MED ORDER — IPRATROPIUM-ALBUTEROL 0.5-2.5 (3) MG/3ML IN SOLN
3.0000 mL | RESPIRATORY_TRACT | Status: DC | PRN
  Filled 2020-12-31: qty 3

## 2020-12-31 MED ORDER — ACETAMINOPHEN 650 MG RE SUPP: 650.0000 mg | Freq: Four times a day (QID) | RECTAL | Status: DC | PRN

## 2020-12-31 MED ORDER — CARVEDILOL 12.5 MG PO TABS
12.5000 mg | ORAL_TABLET | Freq: Two times a day (BID) | ORAL | Status: DC
  Administered 2020-12-31 – 2021-01-02 (×5): 12.5 mg via ORAL
  Filled 2020-12-31 (×5): qty 1

## 2020-12-31 MED ORDER — PANTOPRAZOLE SODIUM 40 MG IV SOLR
40.0000 mg | INTRAVENOUS | Status: DC
  Administered 2020-12-31 – 2021-01-01 (×2): 40 mg via INTRAVENOUS
  Filled 2020-12-31 (×2): qty 40

## 2020-12-31 MED ORDER — MOMETASONE FURO-FORMOTEROL FUM 200-5 MCG/ACT IN AERO
2.0000 | INHALATION_SPRAY | Freq: Two times a day (BID) | RESPIRATORY_TRACT | Status: DC
  Administered 2020-12-31: 2 via RESPIRATORY_TRACT
  Filled 2020-12-31: qty 8.8

## 2020-12-31 MED ORDER — POLYETHYLENE GLYCOL 3350 17 G PO PACK: 17.0000 g | PACK | Freq: Every day | ORAL | Status: DC | PRN

## 2020-12-31 MED ORDER — ACETAMINOPHEN 325 MG PO TABS: 650.0000 mg | ORAL_TABLET | Freq: Four times a day (QID) | ORAL | Status: DC | PRN

## 2020-12-31 MED ORDER — ONDANSETRON HCL 4 MG/2ML IJ SOLN: 4.0000 mg | Freq: Four times a day (QID) | INTRAMUSCULAR | Status: DC | PRN

## 2020-12-31 NOTE — ED Notes (Signed)
Pink armband placed on Pts left wrist. Pt repositioned in bed and provided cup of water.

## 2020-12-31 NOTE — ED Provider Notes (Signed)
[facility] EMERGENCY DEPARTMENT Provider Note   CSN: [id number] Arrival date & time: 12/31/20  1305     History Chief Complaint  Patient presents with  . Shortness of Breath    Kara Knapp is a 50 y.o. female.  HPI      Kara Knapp is a 50 y.o. female with past medical history of anemia, anxiety, diabetes, COPD, end-stage renal disease on hemodialysis Tuesday Thursday Saturdays, and hypertension, who presents to the Emergency Department complaining of generalized weakness, dizziness associated with movement, and shortness of breath.  Symptoms have been present for several days, gradually worsening for 2 days.  She was seen on 12/28/2020 at her hematologist office.  She had a hemoglobin of 7 at that time.  Since then, she has felt as though she is gradually getting weaker and is now using her walker at home for daily activities.  She is also having shortness of breath that is worsened for 2 to 3 days.  She was last dialyzed on Thursday, she missed today's dialysis.  She has had blood transfusions in the past for her anemia, but no transfusions for 4 to 5 years since she started dialysis.  She has some visual loss at baseline, but has not noticed bloody stools.  She denies abdominal pain, chest pain, vomiting.  She is supposed to take pantoprazole 40 mg daily, but has ran out and been without this medication for 1 month.  She denies any aspirin, Aleve, Advil, or Goody powders.  Past Medical History:  Diagnosis Date  . Anemia of chronic disease   . Anxiety   . Asthma   . Blind left eye   . Bronchitis   . Cataract   . Cholecystitis, acute 05/26/2013   Status post cholecystectomy  . Chronic abdominal pain   . Chronic diarrhea   . COPD (chronic obstructive pulmonary disease) ([place])   . Depression   . Diabetic foot ulcer ([place]) 03/01/2015  . Diastolic heart failure ([place])   . ESRD on hemodialysis ([place])    Started diaylsis 12/29/15  . Essential hypertension   . Fibroids   .  Glaucoma   . History of blood transfusion   . History of pneumonia   . Hyperlipidemia   . Insulin-dependent diabetes mellitus with retinopathy   . Liver fibrosis    Negative Hep B surface antigen, negative Hep C antibody Feb 2018 (see scanned in labs).  . Neuropathy   . Osteomyelitis ([place])    Toe on left foot    Patient Active Problem List   Diagnosis Date Noted  . History of vitrectomy 12/07/2020  . COPD with acute exacerbation ([place]) 10/05/2020  . Hepatic cirrhosis ([place]) 04/29/2020  . Diabetic macular edema of right eye with proliferative retinopathy associated with type 2 diabetes mellitus ([place]) 01/13/2020  . Rubeosis iridis of right eye 01/13/2020  . Vitreous hemorrhage of right eye ([place]) 01/13/2020  . GERD (gastroesophageal reflux disease) 09/02/2019  . MDD (major depressive disorder), recurrent, in partial remission ([place]) 08/21/2019  . Elevated ferritin 05/10/2019  . MDD (major depressive disorder), recurrent episode, moderate ([place]) 05/28/2018  . Cocaine use disorder, severe, in sustained remission ([place]) 05/28/2018  . Flatulence 12/18/2017  . Mixed hyperlipidemia 11/28/2017  . Constipation 09/18/2017  . Recurrent infection of skin 08/21/2017  . Ascites 12/21/2016  . Fatty liver 11/08/2016  . Rectal bleeding 09/07/2016  . Legally blind 09/06/2016  . Hepatomegaly 05/04/2016  . Abnormal LFTs 05/04/2016  . Chronic diarrhea 05/04/2016  . Esophageal  dysphagia 05/04/2016  . Mood disorder ([place]) 08/12/2015  . Fibroid, uterine 01/24/2015  . Iron deficiency anemia due to chronic blood loss 01/24/2015  . Menorrhagia with irregular cycle 01/24/2015  . Chronic diastolic CHF (congestive heart failure) ([place]) 01/21/2015  . Type 2 diabetes mellitus with ESRD (end-stage renal disease) ([place]) 01/21/2015  . Diabetic neuropathy ([place]) 01/21/2015  . ESRD on dialysis ([place]) 01/21/2015  . Current smoker 01/21/2015  . Acute respiratory failure with hypoxia ([place]) 01/21/2015  . Essential  hypertension, benign 05/25/2013    Past Surgical History:  Procedure Laterality Date  . A/V SHUNTOGRAM N/A 10/25/2016   Procedure: A/V Shuntogram - Right Arm;  Surgeon: Waynetta Sandy, MD;  Location: [place] [facility];  Service: Cardiovascular;  Laterality: N/A;  . A/V SHUNTOGRAM N/A 03/05/2018   Procedure: A/V SHUNTOGRAM - Right Arm;  Surgeon: Waynetta Sandy, MD;  Location: [place] [facility];  Service: Cardiovascular;  Laterality: N/A;  . AV FISTULA PLACEMENT Right 10/17/2015   Procedure: INSERTION OF ARTERIOVENOUS GORE-TEX GRAFT RIGHT UPPER ARM WITH ACUSEAL;  Surgeon: Conrad Orrtanna, MD;  Location[facility];  Service: Vascular;  Laterality: Right;  . AV FISTULA PLACEMENT Left 11/18/2019   Procedure: INSERTION OF ARTERIOVENOUS (AV) GORE-TEX GRAFT left  ARM;  Surgeon: Serafina Mitchell, MD;  Location: [place];  Service: Vascular;  Laterality: Left;  . CATARACT EXTRACTION W/ INTRAOCULAR LENS IMPLANT Bilateral   . CESAREAN SECTION    . CHOLECYSTECTOMY N/A 05/25/2013   Procedure: LAPAROSCOPIC CHOLECYSTECTOMY;  Surgeon: Jamesetta So, MD;  Location: [facility];  Service: General;  Laterality: N/A;  . COLONOSCOPY WITH PROPOFOL N/A 10/02/2016   two 3 to 5 mm polyps in the descending colon, three 2 to 3 mm polyps in the rectum, random colon biopsies, rectal bleeding due to internal hemorrhoids, friability with no bleeding at the anus status post biopsy.  Surgical pathology found the polyps to be a mix of hyperplastic and tubular adenoma, random colon biopsies to be benign colonic mucosa, and the anal biopsies to be anal skin tag.   Marland Kitchen ESOPHAGOGASTRODUODENOSCOPY (EGD) WITH PROPOFOL N/A 10/02/2016   mucosal nodule in the esophagus status post biopsy, moderate gastritis status post biopsy, mild duodenitis. esophageal biopsy to be benign, gastric biopsies to be gastritis due to aspirin use, and duodenal biopsies to be duodenitis due to aspirin use  . EYE SURGERY Bilateral   . IR FLUORO GUIDE CV LINE  RIGHT  10/09/2019  . IR REMOVAL TUN CV CATH W/O FL  01/15/2020  . IR THROMBECTOMY AV FISTULA W/THROMBOLYSIS/PTA INC/SHUNT/IMG RIGHT Right 12/26/2018  . IR THROMBECTOMY AV FISTULA W/THROMBOLYSIS/PTA INC/SHUNT/IMG RIGHT Right 08/10/2019  . IR THROMBECTOMY AV FISTULA W/THROMBOLYSIS/PTA/STENT INC/SHUNT/IMG RT Right 08/08/2018  . IR THROMBECTOMY AV FISTULA W/THROMBOLYSIS/PTA/STENT INC/SHUNT/IMG RT Right 05/15/2019  . IR US GUIDE VASC ACCESS RIGHT  08/08/2018  . IR US GUIDE VASC ACCESS RIGHT  12/26/2018  . IR US GUIDE VASC ACCESS RIGHT  05/15/2019  . IR US GUIDE VASC ACCESS RIGHT  08/10/2019  . IR US GUIDE VASC ACCESS RIGHT  10/09/2019  . PARS PLANA VITRECTOMY Left 11/24/2014   Procedure: PARS PLANA VITRECTOMY WITH 25 GAUGE;  Surgeon: Hurman Horn, MD;  Location: [place];  Service: Ophthalmology;  Laterality: Left;  . PERIPHERAL VASCULAR BALLOON ANGIOPLASTY Right 03/05/2018   Procedure: PERIPHERAL VASCULAR BALLOON ANGIOPLASTY;  Surgeon: Waynetta Sandy, MD;  Location: [place] [facility];  Service: Cardiovascular;  Laterality: Right;  arm fistula  . PERIPHERAL VASCULAR CATHETERIZATION N/A  04/28/2015   Procedure: Bilateral Upper Extremity Venography;  Surgeon: Conrad Chesterfield, MD;  Location: [place] [facility];  Service: Cardiovascular;  Laterality: N/A;  . PHOTOCOAGULATION WITH LASER Left 11/24/2014   Procedure: PHOTOCOAGULATION WITH LASER;  Surgeon: Hurman Horn, MD;  Location: [place];  Service: Ophthalmology;  Laterality: Left;  with insertion of silicone oil  . SAVORY DILATION N/A 10/02/2016   Procedure: SAVORY DILATION;  Surgeon: Danie Binder, MD;  [facility];  Service: Endoscopy;  Laterality: N/A;  . TUBAL LIGATION    . UPPER EXTREMITY VENOGRAPHY Bilateral 11/02/2019   Procedure: UPPER EXTREMITY VENOGRAPHY;  Surgeon: Waynetta Sandy, MD;  Location: [place] [facility];  Service: Cardiovascular;  Laterality: Bilateral;     OB History    Gravida  5   Para  5   Term       Preterm      AB      Living        SAB      IAB      Ectopic      Multiple      Live Births              Family History  Problem Relation Age of Onset  . COPD Mother   . Cancer Father   . Lymphoma Father   . Diabetes Sister   . Deep vein thrombosis Sister   . Diabetes Brother   . Hyperlipidemia Brother   . Hypertension Brother   . Mental retardation Sister   . Alcohol abuse Paternal Grandmother   . Colon cancer Neg Hx   . Liver disease Neg Hx     Social History   Tobacco Use  . Smoking status: Current Every Day Smoker    Packs/day: 1.00    Years: 23.00    Pack years: 23.00    Types: Cigarettes    Start date: 12/03/2000  . Smokeless tobacco: Never Used  . Tobacco comment: one pack daily  Vaping Use  . Vaping Use: Never used  Substance Use Topics  . Alcohol use: No    Alcohol/week: 0.0 standard drinks  . Drug use: No    Comment: Sober for 8 years    Home Medications Prior to Admission medications   Medication Sig Start Date End Date Taking? Authorizing Provider  acetaminophen (TYLENOL) 500 MG tablet Take 1,000 mg by mouth every 6 (six) hours as needed for moderate pain or headache.    [provider]  ARIPiprazole (ABILIFY) 5 MG tablet Take 1 tablet (5 mg total) by mouth daily. 12/14/20 03/14/21  [name], MD  azelastine (ASTELIN) 0.1 % nasal spray Place 1 spray into both nostrils 2 (two) times daily. Use in each nostril as directed 08/29/18   Dettinger, Fransisca Kaufmann, MD  carvedilol (COREG) 12.5 MG tablet Take 1 tablet (12.5 mg total) by mouth 2 (two) times daily. 10/07/20   Roxan Hockey, MD  cholecalciferol (VITAMIN D) 1000 units tablet Take 1,000 Units by mouth daily.     [provider]  COMBIGAN 0.2-0.5 % ophthalmic solution INSTILL ONE DROP IN [facility] EYE TWICE DAILY Patient taking differently: 1 drop once. 05/17/20   Rankin, Clent Demark, MD  Continuous Blood Gluc Sensor (FREESTYLE LIBRE 14 DAY SENSOR) MISC Inject 1 each into the skin  every 14 (fourteen) days. Use as directed. 11/27/17   Cassandria Anger, MD  cyclobenzaprine (FLEXERIL) 5 MG tablet TAKE ONE TABLET BY MOUTH THREE TIMES DAILY AS  NEEDED FOR MUSCLE SPASM 12/18/19   Dettinger, Fransisca Kaufmann, MD  dexamethasone (DECADRON) 4 MG tablet Take 1 tablet (4 mg total) by mouth daily with breakfast. 10/07/20   Roxan Hockey, MD  doxycycline (VIBRA-TABS) 100 MG tablet Take 1 tablet (100 mg total) by mouth 2 (two) times daily. 10/07/20   Roxan Hockey, MD  gabapentin (NEURONTIN) 100 MG capsule Take 1 capsule (100 mg total) by mouth 3 (three) times daily. (Needs to be seen before next refill) 11/25/19   Dettinger, Fransisca Kaufmann, MD  glucose blood (PRODIGY NO CODING BLOOD GLUC) test strip USE TO CHECK BLOOD SUGAR TWICE DAILY. Dx E11.22 02/03/19   Dettinger, Fransisca Kaufmann, MD  hydrALAZINE (APRESOLINE) 50 MG tablet Take 1 tablet (50 mg total) by mouth 2 (two) times daily. 10/07/20   Roxan Hockey, MD  Insulin Pen Needle (TRUEPLUS PEN NEEDLES) 31G X 5 MM MISC Use daily with insulin Dx E11.22 03/09/19   Dettinger, Fransisca Kaufmann, MD  LANTUS SOLOSTAR 100 UNIT/ML Solostar Pen INJECT 18-50 UNITS SUBCUTANEOUSLY AT BEDTIME. Patient taking differently: Inject 20 Units into the skin at bedtime. 10/01/19   Dettinger, Fransisca Kaufmann, MD  linagliptin (TRADJENTA) 5 MG TABS tablet Take 1 tablet (5 mg total) by mouth daily. Patient taking differently: Take 5 mg by mouth every evening. 03/18/19   Dettinger, Fransisca Kaufmann, MD  multivitamin (RENA-VIT) TABS tablet Take 1 tablet by mouth daily. 04/03/19   [provider]  pantoprazole (PROTONIX) 40 MG tablet TAKE 1 BY MOUTH DAILY 30 minutes before breakfast Patient taking differently: Take 40 mg by mouth daily. 02/02/19   Dettinger, Fransisca Kaufmann, MD  Probiotic Product (CULTURELLE PROBIOTICS PO) Take 1 capsule by mouth daily.     [provider]  PRODIGY TWIST TOP LANCETS 28G MISC USE TO CHECK BLOOD SUGAR UP TO FOUR TIMES DAILY. 03/13/17   Timmothy Euler, MD  sertraline  (ZOLOFT) 100 MG tablet Take 1.5 tablets (150 mg total) by mouth daily. 12/04/20   [name], MD  sevelamer carbonate (RENVELA) 800 MG tablet Take 800-2,400 mg by mouth See admin instructions. Take 2439m by mouth three times daily with means and 8036mwith snacks    [provider]  SYMBICORT 160-4.5 MCG/ACT inhaler Inhale 2 puffs into the lungs 2 (two) times daily. 10/07/20   EmRoxan HockeyMD  VENTOLIN HFA 108 (90 Base) MCG/ACT inhaler Inhale 2 puffs into the lungs every 4 (four) hours as needed for wheezing or shortness of breath. 10/07/20   EmRoxan HockeyMD    Allergies    Sulfamethoxazole-trimethoprim, Bactrim [sulfamethoxazole-trimethoprim], and Prednisone  Review of Systems   Review of Systems  Constitutional: Negative for chills, fatigue and fever.  HENT: Negative for sore throat and trouble swallowing.   Respiratory: Positive for shortness of breath. Negative for cough and wheezing.   Cardiovascular: Negative for chest pain, palpitations and leg swelling.  Gastrointestinal: Negative for abdominal pain, blood in stool, diarrhea, nausea and vomiting.  Genitourinary: Negative for dysuria, flank pain and hematuria.  Musculoskeletal: Negative for arthralgias, back pain, myalgias, neck pain and neck stiffness.  Skin: Negative for rash.  Neurological: Positive for dizziness and weakness (Generalized weakness). Negative for syncope and numbness.  Hematological: Does not bruise/bleed easily.    Physical Exam Updated Vital Signs BP (!) 153/74   Pulse 69   Temp 98.3 F (36.8 C) (Oral)   Resp 16   Wt 86.5 kg   LMP 05/05/2015   SpO2 96%   BMI 33.76 kg/m   Physical Exam  Vitals and nursing note reviewed.  Constitutional:      Appearance: Normal appearance. She is not ill-appearing or toxic-appearing.  Eyes:     Conjunctiva/sclera: Conjunctivae normal.  Cardiovascular:     Rate and Rhythm: Normal rate and regular rhythm.     Pulses: Normal pulses.  Pulmonary:      Effort: Pulmonary effort is normal. No respiratory distress.  Abdominal:     Palpations: Abdomen is soft.     Tenderness: There is no abdominal tenderness.  Genitourinary:    Rectum: Guaiac result negative. No mass, tenderness or external hemorrhoid. Normal anal tone.     Comments: Digital rectal exam performed by me.  Light brown heme-negative stool.  No palpable rectal masses or visualized external hemorrhoids. Musculoskeletal:     Right lower leg: No edema.     Left lower leg: No edema.  Skin:    General: Skin is warm.     Capillary Refill: Capillary refill takes less than 2 seconds.     Findings: No rash.  Neurological:     General: No focal deficit present.     Mental Status: She is alert.     Sensory: No sensory deficit.     Motor: No weakness.     ED Results / Procedures / Treatments   Labs (all labs ordered are listed, but only abnormal results are displayed) Labs Reviewed  CBC - Abnormal; Notable for the following components:      Result Value   RBC 2.77 (*)    Hemoglobin 6.6 (*)    HCT 23.0 (*)    MCH 23.8 (*)    MCHC 28.7 (*)    RDW 17.8 (*)    All other components within normal limits  COMPREHENSIVE METABOLIC PANEL - Abnormal; Notable for the following components:   Glucose, Bld 145 (*)    BUN 32 (*)    Creatinine, Ser 7.47 (*)    GFR, Estimated 6 (*)    All other components within normal limits  IRON AND TIBC - Abnormal; Notable for the following components:   Iron 27 (*)    TIBC 584 (*)    Saturation Ratios 5 (*)    All other components within normal limits  RETICULOCYTES - Abnormal; Notable for the following components:   RBC. 2.98 (*)    Immature Retic Fract 37.5 (*)    All other components within normal limits  RESP PANEL BY RT-PCR (FLU A&B, COVID) ARPGX2  VITAMIN B12  FOLATE  FERRITIN  POC OCCULT BLOOD, ED  TYPE AND SCREEN  PREPARE RBC (CROSSMATCH)  ABO/RH    EKG EKG Interpretation  Date/Time: 12/31/20  Ventricular Rate: 69  PR  Interval: 168  QRS Duration: 96  QT Interval: 446  QTC Calculation: 470  R Axis:    Text Interpretation: Sinus rhythm with right axis deviation minimal ST depression in the inferior leads.    EKG reviewed by Dr. Roderic Palau.  Radiology DG Chest Portable 1 View  Result Date: 12/31/2020 CLINICAL DATA:  Shortness of breath and dizziness EXAM: PORTABLE CHEST 1 VIEW COMPARISON:  Portable exam 1512 hours compared to 10/05/2020 FINDINGS: Enlargement of cardiac silhouette. Mediastinal contours and pulmonary vascularity normal. Atherosclerotic calcification aorta. Lungs clear. No pulmonary infiltrate, pleural effusion, or pneumothorax. Bones demineralized. Vascular stents in RIGHT upper extremity. IMPRESSION: Enlargement of cardiac silhouette. No acute abnormalities. Aortic Atherosclerosis (ICD10-I70.0). Electronically Signed   By: Lavonia Dana M.D.   On: 12/31/2020 15:20    Procedures Procedures  CRITICAL CARE Performed by: Dorena Dorfman Total critical care time: 35 minutes Critical care time was exclusive of separately billable procedures and treating other patients. Critical care was necessary to treat or prevent imminent or life-threatening deterioration. Critical care was time spent personally by me on the following activities: development of treatment plan with patient and/or surrogate as well as nursing, discussions with consultants, evaluation of patient's response to treatment, examination of patient, obtaining history from patient or surrogate, ordering and performing treatments and interventions, ordering and review of laboratory studies, ordering and review of radiographic studies, pulse oximetry and re-evaluation of patient's condition.  Medications Ordered in ED Medications  0.9 %  sodium chloride infusion (Manually program via Guardrails IV Fluids) (has no administration in time range)    ED Course  I have reviewed the triage vital signs and the nursing notes.  Pertinent labs &  imaging results that were available during my care of the patient were reviewed by me and considered in my medical decision making (see chart for details).    MDM Rules/Calculators/A&P                         Patient here for symptoms concerning of symptomatic anemia.  She was seen by heme-onc on 12/28/2020 and hemoglobin was 7 at that time.  Today her hemoglobin is 6.6.  She has a negative Hemoccult here.  Will proceed with transfusion of 2 units.  No respiratory distress.  No hypoxia.  No focal neurologic deficits.  On review of patient's medical record, heme-onc had notified GI that patient may have a GI blood loss and requested the patient be seen in 2 to 3 weeks for EGD and colonoscopy.  She will need hospital admission.  I will also consult GI.    1710 discussed findings with GI, Dr. Abbey Chatters who will plan to schedule colonoscopy and EGD during pt's hospital stay.  Discussed findings with hospitalist, Dr. Arlyce Dice who agrees to admit.    Final Clinical Impression(s) / ED Diagnoses Final diagnoses:  Symptomatic anemia    Rx / DC Orders ED Discharge Orders    None       Kem Parkinson, PA-C 12/31/20 1813    Varney Biles, MD 01/01/21 [phone]

## 2020-12-31 NOTE — ED Triage Notes (Signed)
Pt from home via [facility]. Pt reports dizziness and SHOB. Pt reports her Hgb is 7.0 on Wednesday of this week.

## 2020-12-31 NOTE — ED Notes (Signed)
Date and time results received: 12/31/20 2:28 PM   Test: Hbg Critical Value: 6.6  Name of Provider Notified: Dr, Kathrynn Humble  Orders Received? Or Actions Taken?: see orders

## 2020-12-31 NOTE — H&P (Signed)
History and Physical    Kara Knapp ELF:[id number] DOB: April 28, 1971 DOA: 12/31/2020  PCP: [facility], [facility]   Patient coming from: Home  I have personally briefly reviewed patient's old medical records in [place]  Chief Complaint: Dizziness, SOB  HPI: Kara Knapp is a 50 y.o. female with medical history significant for  ESRD- T Th Sat, diabetes mellitus, hypertension, legally blind, cocaine use, COPD. Patient presented to the ED with complaints of increasing difficulty breathing and dizziness present over the past week but worsened over the past 2 days.  Difficulty breathing is worse with exertion, resolves with rest.  No chest pain.  No lower extremity swelling.  Patient is legally blind, he tells me she is unable to tell the color of her stool and she does not know if it has been black.  She denies vomiting of blood. No abdominal pain.  She denies NSAID use and takes Tylenol only for pain.  ED Course: Stable vitals.  Hemoglobin 6.6.  Chest x-ray without acute abnormality.  Rectal exam in the ED, stool FOBT negative, stool was light brown.  2 unit of blood initially ordered for transfusion.  As patient missed dialysis today, last HD was 2 days ago Thursday, I recommended that 1 unit be transfused today.  Review of Systems: As per HPI all other systems reviewed and negative.  Past Medical History:  Diagnosis Date  . Anemia of chronic disease   . Anxiety   . Asthma   . Blind left eye   . Bronchitis   . Cataract   . Cholecystitis, acute 05/26/2013   Status post cholecystectomy  . Chronic abdominal pain   . Chronic diarrhea   . COPD (chronic obstructive pulmonary disease) ([place])   . Depression   . Diabetic foot ulcer ([place]) 03/01/2015  . Diastolic heart failure ([place])   . ESRD on hemodialysis ([place])    Started diaylsis 12/29/15  . Essential hypertension   . Fibroids   . Glaucoma   . History of blood transfusion   . History of pneumonia   . Hyperlipidemia   .  Insulin-dependent diabetes mellitus with retinopathy   . Liver fibrosis    Negative Hep B surface antigen, negative Hep C antibody Feb 2018 (see scanned in labs).  . Neuropathy   . Osteomyelitis ([place])    Toe on left foot    Past Surgical History:  Procedure Laterality Date  . A/V SHUNTOGRAM N/A 10/25/2016   Procedure: A/V Shuntogram - Right Arm;  Surgeon: Waynetta Sandy, MD;  Location: [facility];  Service: Cardiovascular;  Laterality: N/A;  . A/V SHUNTOGRAM N/A 03/05/2018   Procedure: A/V SHUNTOGRAM - Right Arm;  Surgeon: Waynetta Sandy, MD;  Location: [facility];  Service: Cardiovascular;  Laterality: N/A;  . AV FISTULA PLACEMENT Right 10/17/2015   Procedure: INSERTION OF ARTERIOVENOUS GORE-TEX GRAFT RIGHT UPPER ARM WITH ACUSEAL;  Surgeon: Conrad , MD;  Location: [facility] OR;  Service: Vascular;  Laterality: Right;  . AV FISTULA PLACEMENT Left 11/18/2019   Procedure: INSERTION OF ARTERIOVENOUS (AV) GORE-TEX GRAFT left  ARM;  Surgeon: Serafina Mitchell, MD;  Location: [facility];  Service: Vascular;  Laterality: Left;  . CATARACT EXTRACTION W/ INTRAOCULAR LENS IMPLANT Bilateral   . CESAREAN SECTION    . CHOLECYSTECTOMY N/A 05/25/2013   Procedure: LAPAROSCOPIC CHOLECYSTECTOMY;  Surgeon: Jamesetta So, MD;  Location: [facility];  Service: General;  Laterality: N/A;  . COLONOSCOPY WITH PROPOFOL N/A 10/02/2016  two 3 to 5 mm polyps in the descending colon, three 2 to 3 mm polyps in the rectum, random colon biopsies, rectal bleeding due to internal hemorrhoids, friability with no bleeding at the anus status post biopsy.  Surgical pathology found the polyps to be a mix of hyperplastic and tubular adenoma, random colon biopsies to be benign colonic mucosa, and the anal biopsies to be anal skin tag.   Marland Kitchen ESOPHAGOGASTRODUODENOSCOPY (EGD) WITH PROPOFOL N/A 10/02/2016   mucosal nodule in the esophagus status post biopsy, moderate gastritis status post biopsy, mild duodenitis.  esophageal biopsy to be benign, gastric biopsies to be gastritis due to aspirin use, and duodenal biopsies to be duodenitis due to aspirin use  . EYE SURGERY Bilateral   . IR FLUORO GUIDE CV LINE RIGHT  10/09/2019  . IR REMOVAL TUN CV CATH W/O FL  01/15/2020  . IR THROMBECTOMY AV FISTULA W/THROMBOLYSIS/PTA INC/SHUNT/IMG RIGHT Right 12/26/2018  . IR THROMBECTOMY AV FISTULA W/THROMBOLYSIS/PTA INC/SHUNT/IMG RIGHT Right 08/10/2019  . IR THROMBECTOMY AV FISTULA W/THROMBOLYSIS/PTA/STENT INC/SHUNT/IMG RT Right 08/08/2018  . IR THROMBECTOMY AV FISTULA W/THROMBOLYSIS/PTA/STENT INC/SHUNT/IMG RT Right 05/15/2019  . IR US GUIDE VASC ACCESS RIGHT  08/08/2018  . IR US GUIDE VASC ACCESS RIGHT  12/26/2018  . IR US GUIDE VASC ACCESS RIGHT  05/15/2019  . IR US GUIDE VASC ACCESS RIGHT  08/10/2019  . IR US GUIDE VASC ACCESS RIGHT  10/09/2019  . PARS PLANA VITRECTOMY Left 11/24/2014   Procedure: PARS PLANA VITRECTOMY WITH 25 GAUGE;  Surgeon: Hurman Horn, MD;  Location: [facility];  Service: Ophthalmology;  Laterality: Left;  . PERIPHERAL VASCULAR BALLOON ANGIOPLASTY Right 03/05/2018   Procedure: PERIPHERAL VASCULAR BALLOON ANGIOPLASTY;  Surgeon: Waynetta Sandy, MD;  Location: [facility];  Service: Cardiovascular;  Laterality: Right;  arm fistula  . PERIPHERAL VASCULAR CATHETERIZATION N/A 04/28/2015   Procedure: Bilateral Upper Extremity Venography;  Surgeon: Conrad Blanchard, MD;  Location: [place] [facility];  Service: Cardiovascular;  Laterality: N/A;  . PHOTOCOAGULATION WITH LASER Left 11/24/2014   Procedure: PHOTOCOAGULATION WITH LASER;  Surgeon: Hurman Horn, MD;  Location: [facility];  Service: Ophthalmology;  Laterality: Left;  with insertion of silicone oil  . SAVORY DILATION N/A 10/02/2016   Procedure: SAVORY DILATION;  Surgeon: Danie Binder, MD;  [facility];  Service: Endoscopy;  Laterality: N/A;  . TUBAL LIGATION    . UPPER EXTREMITY VENOGRAPHY Bilateral 11/02/2019   Procedure: UPPER EXTREMITY  VENOGRAPHY;  Surgeon: Waynetta Sandy, MD;  Location: [facility];  Service: Cardiovascular;  Laterality: Bilateral;     reports that she has been smoking cigarettes. She started smoking about 20 years ago. She has a 23.00 pack-year smoking history. She has never used smokeless tobacco. She reports that she does not drink alcohol and does not use drugs.  Allergies  Allergen Reactions  . Sulfamethoxazole-Trimethoprim Nausea And Vomiting  . Bactrim [Sulfamethoxazole-Trimethoprim] Nausea And Vomiting  . Prednisone Other (See Comments)    "I was wide open and couldn't eat" per pt. Loss of appetite and insomnia  LOSS OF APPETITE,UNABLE TO SLEEP    Family History  Problem Relation Age of Onset  . COPD Mother   . Cancer Father   . Lymphoma Father   . Diabetes Sister   . Deep vein thrombosis Sister   . Diabetes Brother   . Hyperlipidemia Brother   . Hypertension Brother   . Mental retardation Sister   . Alcohol abuse Paternal Grandmother   .  Colon cancer Neg Hx   . Liver disease Neg Hx     Prior to Admission medications   Medication Sig Start Date End Date Taking? Authorizing Provider  acetaminophen (TYLENOL) 500 MG tablet Take 1,000 mg by mouth every 6 (six) hours as needed for moderate pain or headache.    [provider]  ARIPiprazole (ABILIFY) 5 MG tablet Take 1 tablet (5 mg total) by mouth daily. 12/14/20 03/14/21  [name], MD  azelastine (ASTELIN) 0.1 % nasal spray Place 1 spray into both nostrils 2 (two) times daily. Use in each nostril as directed 08/29/18   Dettinger, Fransisca Kaufmann, MD  carvedilol (COREG) 12.5 MG tablet Take 1 tablet (12.5 mg total) by mouth 2 (two) times daily. 10/07/20   Roxan Hockey, MD  cholecalciferol (VITAMIN D) 1000 units tablet Take 1,000 Units by mouth daily.     [provider]  COMBIGAN 0.2-0.5 % ophthalmic solution INSTILL ONE DROP IN [facility] EYE TWICE DAILY Patient taking differently: 1 drop once. 05/17/20   Rankin,  Clent Demark, MD  Continuous Blood Gluc Sensor (FREESTYLE LIBRE 14 DAY SENSOR) MISC Inject 1 each into the skin every 14 (fourteen) days. Use as directed. 11/27/17   Cassandria Anger, MD  cyclobenzaprine (FLEXERIL) 5 MG tablet TAKE ONE TABLET BY MOUTH THREE TIMES DAILY AS NEEDED FOR MUSCLE SPASM 12/18/19   Dettinger, Fransisca Kaufmann, MD  dexamethasone (DECADRON) 4 MG tablet Take 1 tablet (4 mg total) by mouth daily with breakfast. 10/07/20   Roxan Hockey, MD  doxycycline (VIBRA-TABS) 100 MG tablet Take 1 tablet (100 mg total) by mouth 2 (two) times daily. 10/07/20   Roxan Hockey, MD  gabapentin (NEURONTIN) 100 MG capsule Take 1 capsule (100 mg total) by mouth 3 (three) times daily. (Needs to be seen before next refill) 11/25/19   Dettinger, Fransisca Kaufmann, MD  glucose blood (PRODIGY NO CODING BLOOD GLUC) test strip USE TO CHECK BLOOD SUGAR TWICE DAILY. Dx E11.22 02/03/19   Dettinger, Fransisca Kaufmann, MD  hydrALAZINE (APRESOLINE) 50 MG tablet Take 1 tablet (50 mg total) by mouth 2 (two) times daily. 10/07/20   Roxan Hockey, MD  Insulin Pen Needle (TRUEPLUS PEN NEEDLES) 31G X 5 MM MISC Use daily with insulin Dx E11.22 03/09/19   Dettinger, Fransisca Kaufmann, MD  LANTUS SOLOSTAR 100 UNIT/ML Solostar Pen INJECT 18-50 UNITS SUBCUTANEOUSLY AT BEDTIME. Patient taking differently: Inject 20 Units into the skin at bedtime. 10/01/19   Dettinger, Fransisca Kaufmann, MD  linagliptin (TRADJENTA) 5 MG TABS tablet Take 1 tablet (5 mg total) by mouth daily. Patient taking differently: Take 5 mg by mouth every evening. 03/18/19   Dettinger, Fransisca Kaufmann, MD  multivitamin (RENA-VIT) TABS tablet Take 1 tablet by mouth daily. 04/03/19   [provider]  pantoprazole (PROTONIX) 40 MG tablet TAKE 1 BY MOUTH DAILY 30 minutes before breakfast Patient taking differently: Take 40 mg by mouth daily. 02/02/19   Dettinger, Fransisca Kaufmann, MD  Probiotic Product (CULTURELLE PROBIOTICS PO) Take 1 capsule by mouth daily.     [provider]  PRODIGY TWIST TOP LANCETS  28G MISC USE TO CHECK BLOOD SUGAR UP TO FOUR TIMES DAILY. 03/13/17   Timmothy Euler, MD  sertraline (ZOLOFT) 100 MG tablet Take 1.5 tablets (150 mg total) by mouth daily. 12/04/20   [name], MD  sevelamer carbonate (RENVELA) 800 MG tablet Take 800-2,400 mg by mouth See admin instructions. Take 2451m by mouth three times daily with means and 8030mwith snacks  [provider]  SYMBICORT 160-4.5 MCG/ACT inhaler Inhale 2 puffs into the lungs 2 (two) times daily. 10/07/20   Roxan Hockey, MD  VENTOLIN HFA 108 (90 Base) MCG/ACT inhaler Inhale 2 puffs into the lungs every 4 (four) hours as needed for wheezing or shortness of breath. 10/07/20   Roxan Hockey, MD    Physical Exam: Vitals:   12/31/20 1606 12/31/20 1630 12/31/20 1645 12/31/20 1710  BP:  (!) 125/101 116/82 (!) 147/75  Pulse:  68 68 67  Resp:  18 (!) 21 (!) 22  Temp: 98.7 F (37.1 C) 98.7 F (37.1 C) 98 F (36.7 C)   TempSrc: Oral Oral Oral   SpO2:  94% 94% 92%  Weight:        Constitutional: NAD, calm, comfortable Vitals:   12/31/20 1606 12/31/20 1630 12/31/20 1645 12/31/20 1710  BP:  (!) 125/101 116/82 (!) 147/75  Pulse:  68 68 67  Resp:  18 (!) 21 (!) 22  Temp: 98.7 F (37.1 C) 98.7 F (37.1 C) 98 F (36.7 C)   TempSrc: Oral Oral Oral   SpO2:  94% 94% 92%  Weight:       Eyes: PERRL, lids and conjunctivae normal ENMT: Mucous membranes are moist.   Neck: normal, supple, no masses, no thyromegaly Respiratory: clear to auscultation bilaterally, no wheezing, no crackles. Normal respiratory effort. No accessory muscle use.  Cardiovascular: Regular rate and rhythm, no murmurs / rubs / gallops. No extremity edema. 2+ pedal pulses.  Abdomen: no tenderness, no masses palpated. No hepatosplenomegaly. Bowel sounds positive.  Musculoskeletal: no clubbing / cyanosis. No joint deformity upper and lower extremities. Good ROM, no contractures. Normal muscle tone.  Skin: no rashes, lesions, ulcers. No  induration Neurologic: No apparent cranial formality, moving extremities spontaneously Psychiatric: Normal judgment and insight. Alert and oriented x 3. Normal mood.   Labs on Admission: I have personally reviewed following labs and imaging studies  CBC: Recent Labs  Lab 12/28/20 1354 12/31/20 1343  WBC 6.4 6.5  NEUTROABS 4.9  --   HGB 7.0* 6.6*  HCT 24.8* 23.0*  MCV 82.9 83.0  PLT 224 081   Basic Metabolic Panel: Recent Labs  Lab 12/28/20 1354 12/31/20 1343  NA 133* 135  K 4.1 4.0  CL 97* 98  CO2 27 28  GLUCOSE 93 145*  BUN 41* 32*  CREATININE 7.82* 7.47*  CALCIUM 8.9 9.0   Liver Function Tests: Recent Labs  Lab 12/28/20 1354 12/31/20 1343  AST 17 15  ALT 16 14  ALKPHOS 82 82  BILITOT 0.6 0.6  PROT 7.3 6.8  ALBUMIN 3.7 3.5   Anemia Panel: Recent Labs    12/31/20 1500  VITAMINB12 898  FOLATE 18.6  FERRITIN 45  TIBC 584*  IRON 27*  RETICCTPCT 2.8   Urine analysis:    Component Value Date/Time   COLORURINE YELLOW 09/07/2017 1105   APPEARANCEUR CLEAR 09/07/2017 1105   APPEARANCEUR Clear 08/02/2017 1456   LABSPEC 1.013 09/07/2017 1105   PHURINE 6.0 09/07/2017 1105   GLUCOSEU >=500 (A) 09/07/2017 1105   HGBUR NEGATIVE 09/07/2017 1105   BILIRUBINUR NEGATIVE 09/07/2017 1105   BILIRUBINUR Negative 08/02/2017 1456   KETONESUR NEGATIVE 09/07/2017 1105   PROTEINUR 100 (A) 09/07/2017 1105   UROBILINOGEN negative 09/06/2015 1133   UROBILINOGEN 0.2 01/21/2015 1448   NITRITE NEGATIVE 09/07/2017 1105   LEUKOCYTESUR NEGATIVE 09/07/2017 1105   LEUKOCYTESUR Trace (A) 08/02/2017 1456    Radiological Exams on Admission: DG Chest Portable 1 View  Result Date: 12/31/2020 CLINICAL DATA:  Shortness of breath and dizziness EXAM: PORTABLE CHEST 1 VIEW COMPARISON:  Portable exam 1512 hours compared to 10/05/2020 FINDINGS: Enlargement of cardiac silhouette. Mediastinal contours and pulmonary vascularity normal. Atherosclerotic calcification aorta. Lungs clear. No  pulmonary infiltrate, pleural effusion, or pneumothorax. Bones demineralized. Vascular stents in RIGHT upper extremity. IMPRESSION: Enlargement of cardiac silhouette. No acute abnormalities. Aortic Atherosclerosis (ICD10-I70.0). Electronically Signed   By: Lavonia Dana M.D.   On: 12/31/2020 15:20    EKG: Independently reviewed.  Sinus rhythm rate 69, QTc 478.  Assessment/Plan Principal Problem:   Acute anemia Active Problems:   Essential hypertension, benign   Chronic diastolic CHF (congestive heart failure) (H[facility])   Type 2 diabetes mellitus with ESRD (end-stage renal disease) ([facility])   Legally blind   Hepatic cirrhosis ([facility])  Acute on chronic symptomatic anemia-hemoglobin 6.6, (gradual downtrend in hemoglobin over the past year, from 10.5) Red blood cell indices and anemia panel suggest iron deficiency.  No apparent GI blood loss.  FOBT negative.  Denies NSAID use.  Vitals stable.  Last EGD and colonoscopy by Dr. Oneida Alar 2018-showed moderate gastritis, sessile polyps, rectal bleeding 2/2 internal hemorrhoids. -N.p.o. midnight, -IV Protonix 40 daily -GI consult in the morning -Transfuse 1 units today -CBC in the morning  HTN-stable. -Resume carvedilol, hydralazine  ESRD-schedule T Th Sat.  Last HD was 2 days ago Thursday, missed HD today.  Still makes moderate amount of urine. -May need second unit of blood, recommend transfusion with HD -Please consult nephrology in the morning -Resume Renvela  COPD-stable -DuoNebs as needed  Depression -Resume Zoloft, Abilify  Chronic diastolic CHF-stable and compensated.  Last echo 2018 - EF 55 to 60%, G2DD. - per HD  DM-random glucose 145.  - SSI- S - HgbA1c -Hold Home Lantus 20 QHS while n.p.o midnight   DVT prophylaxis: SCDS Code Status: Full code Family Communication: None at bedside Disposition Plan: ~ 1-2 days, pending GI evaluation Consults called: GI Admission status: Obs tele  Bethena Roys MD [facility]  12/31/2020, 8:00 PM

## 2021-01-01 DIAGNOSIS — I5032 Chronic diastolic (congestive) heart failure: Secondary | ICD-10-CM | POA: Diagnosis not present

## 2021-01-01 DIAGNOSIS — H548 Legal blindness, as defined in USA: Secondary | ICD-10-CM | POA: Diagnosis not present

## 2021-01-01 DIAGNOSIS — K2970 Gastritis, unspecified, without bleeding: Secondary | ICD-10-CM | POA: Diagnosis not present

## 2021-01-01 DIAGNOSIS — E1122 Type 2 diabetes mellitus with diabetic chronic kidney disease: Secondary | ICD-10-CM | POA: Diagnosis not present

## 2021-01-01 DIAGNOSIS — D649 Anemia, unspecified: Secondary | ICD-10-CM | POA: Diagnosis not present

## 2021-01-01 DIAGNOSIS — I10 Essential (primary) hypertension: Secondary | ICD-10-CM | POA: Diagnosis not present

## 2021-01-01 DIAGNOSIS — N186 End stage renal disease: Secondary | ICD-10-CM | POA: Diagnosis not present

## 2021-01-01 LAB — BASIC METABOLIC PANEL
Anion gap: 10 (ref 5–15)
BUN: 36 mg/dL — ABNORMAL HIGH (ref 6–20)
CO2: 26 mmol/L (ref 22–32)
Calcium: 8.9 mg/dL (ref 8.9–10.3)
Chloride: 99 mmol/L (ref 98–111)
Creatinine, Ser: 8.03 mg/dL — ABNORMAL HIGH (ref 0.44–1.00)
GFR, Estimated: 6 mL/min — ABNORMAL LOW (ref >60)
Glucose, Bld: 159 mg/dL — ABNORMAL HIGH (ref 70–99)
Potassium: 4.2 mmol/L (ref 3.5–5.1)
Sodium: 135 mmol/L (ref 135–145)

## 2021-01-01 LAB — CBC
HCT: 26.3 % — ABNORMAL LOW (ref 36.0–46.0)
Hemoglobin: 7.7 g/dL — ABNORMAL LOW (ref 12.0–15.0)
MCH: 24.5 pg — ABNORMAL LOW (ref 26.0–34.0)
MCHC: 29.3 g/dL — ABNORMAL LOW (ref 30.0–36.0)
MCV: 83.8 fL (ref 80.0–100.0)
Platelets: 206 10*3/uL (ref 150–400)
RBC: 3.14 MIL/uL — ABNORMAL LOW (ref 3.87–5.11)
RDW: 17.6 % — ABNORMAL HIGH (ref 11.5–15.5)
WBC: 7 10*3/uL (ref 4.0–10.5)
nRBC: 0 % (ref 0.0–0.2)

## 2021-01-01 LAB — GLUCOSE, CAPILLARY
Glucose-Capillary: 143 mg/dL — ABNORMAL HIGH (ref 70–99)
Glucose-Capillary: 145 mg/dL — ABNORMAL HIGH (ref 70–99)
Glucose-Capillary: 182 mg/dL — ABNORMAL HIGH (ref 70–99)
Glucose-Capillary: 182 mg/dL — ABNORMAL HIGH (ref 70–99)
Glucose-Capillary: 190 mg/dL — ABNORMAL HIGH (ref 70–99)
Glucose-Capillary: 197 mg/dL — ABNORMAL HIGH (ref 70–99)

## 2021-01-01 MED ORDER — PEG 3350-KCL-NA BICARB-NACL 420 G PO SOLR: Freq: Once | ORAL | Status: AC

## 2021-01-01 MED ORDER — BUDESONIDE 0.5 MG/2ML IN SUSP
0.5000 mg | Freq: Two times a day (BID) | RESPIRATORY_TRACT | Status: DC
  Administered 2021-01-01 – 2021-01-02 (×3): 0.5 mg via RESPIRATORY_TRACT
  Filled 2021-01-01 (×4): qty 2

## 2021-01-01 MED ORDER — SODIUM CHLORIDE 0.9 % IV SOLN
400.0000 mg | Freq: Once | INTRAVENOUS | Status: AC
  Administered 2021-01-01: 400 mg via INTRAVENOUS
  Filled 2021-01-01: qty 20

## 2021-01-01 MED ORDER — INSULIN ASPART 100 UNIT/ML IJ SOLN: 0.0000 [IU] | Freq: Every day | INTRAMUSCULAR | Status: DC

## 2021-01-01 MED ORDER — IPRATROPIUM-ALBUTEROL 0.5-2.5 (3) MG/3ML IN SOLN
3.0000 mL | Freq: Four times a day (QID) | RESPIRATORY_TRACT | Status: DC
  Administered 2021-01-01 – 2021-01-02 (×5): 3 mL via RESPIRATORY_TRACT
  Filled 2021-01-01 (×4): qty 3

## 2021-01-01 MED ORDER — DEXAMETHASONE 4 MG PO TABS: 4.0000 mg | ORAL_TABLET | Freq: Every day | ORAL | 0 refills | Status: DC

## 2021-01-01 MED ORDER — DEXAMETHASONE 4 MG PO TABS
4.0000 mg | ORAL_TABLET | Freq: Every day | ORAL | Status: DC
  Administered 2021-01-01 – 2021-01-02 (×2): 4 mg via ORAL
  Filled 2021-01-01 (×2): qty 1

## 2021-01-01 MED ORDER — INSULIN ASPART 100 UNIT/ML IJ SOLN
0.0000 [IU] | Freq: Three times a day (TID) | INTRAMUSCULAR | Status: DC
  Administered 2021-01-01: 2 [IU] via SUBCUTANEOUS
  Administered 2021-01-01 – 2021-01-02 (×4): 1 [IU] via SUBCUTANEOUS

## 2021-01-01 NOTE — Discharge Summary (Signed)
Physician Discharge Summary  Kara Knapp MLY:[id number] DOB: 1971-01-31 DOA: 12/31/2020  PCP: [facility]  Admit date: 12/31/2020 Discharge date: 01/01/2021  Admitted From: Home Disposition:  Home  Recommendations for Outpatient Follow-up:  1. Follow up with PCP in 1-2 weeks 2. Please obtain BMP/CBC in one week     Discharge Condition: Stable CODE STATUS: FULL Diet recommendation: Heart Healthy / Carb Modified    Brief/Interim Summary: 50 year old female with history of ESRD, diabetes mellitus type 2, hypertension, tobacco abuse, COPD presenting with 2 to 3-day history of shortness of breath, dizziness, and dyspnea on exertion.  She stated that she has been followed at the cancer center at [facility] for her anemia.  She had a blood draw on 12/28/2020 with a hemoglobin of 7.0.  She tentatively had an appointment for PRBC transfusion, but she stated that it was not until 01/06/2021.  However, she began developing some dizziness, shortness of breath, and generalized weakness beginning 12/28/2020.  Her symptoms progressively worsened.  As result, she presented for further evaluation.  She denied any fevers, chills, chest pain, coughing, hemoptysis, nausea, vomiting or diarrhea, abdominal pain, headache, neck pain.  She is legally blind; therefore, she is not able to tell whether she is actually having any melena or hematochezia or hematemesis.  Nevertheless, she states that she has not had any emesis.  She had a colonoscopy on 10/02/2016 which revealed polyps in the descending colon and rectum.  Otherwise the colon was normal.  EGD on 10/02/2016 showed moderate gastritis. In the emergency department, the patient was afebrile hemodynamically stable with oxygen saturation 96% on room air.  Hemoglobin was noted to be 6.6.  The patient was transfused 1 unit PRBC.  Discharge Diagnoses:  Symptomatic anemia/iron deficiency anemia -FOBT negative -1 unit PRBC transfused -Iron saturation 5%,  ferritin 45 -C12 751 -Folic acid 70.0 -Transfuse Venofer 400 mg x 1 -personally reviewed EKG--sinus, nonspecific ST change  ESRD -Patient normally dialyzes Tuesday, Thursday, Saturday -Last dialysis on 12/29/2020 -Continue Renvela -Case was discussed with nephrology, Dr. Graylon Gunning-- -patient stated that she wanted to go home and dialyze on her usual schedule -as the patient is euvolemic, saturating 96% on room air without any shortness of breath (after transfusion) with potassium 4.2 and bicarbonate 26 on 01/01/2021--patient can go back for her usual dialysis on her normal schedule with next dialysis on 01/03/2021--this was discussed with Dr. Graylon Gunning  COPD Exacerbation -mild -She has mild wheezing on exam but stable on room air without any dyspnea -She received bronchodilators and Pulmicort during the hospitalization -d/c home with dexamethasone 4 mg daily x 5 days (intolerance to prednisone)  Essential hypertension -Continue carvedilol and hydralazine  Depression/anxiety -Continue Zoloft and Abilify  Diabetes mellitus type 2 -Nonetheless on a scale -Hemoglobin A1c pending at the time of discharge -Resume Tradjenta at discharge  Chronic diastolic CHF -Clinically euvolemic  Tobacco abuse -Tobacco cessation discussed  Remote cocaine use -Patient quit using 13 years ago   Discharge Instructions   Allergies as of 01/01/2021      Reactions   Sulfamethoxazole-trimethoprim Nausea And Vomiting   Bactrim [sulfamethoxazole-trimethoprim] Nausea And Vomiting   Prednisone Other (See Comments)   "I was wide open and couldn't eat" per pt. Loss of appetite and insomnia  LOSS OF APPETITE,UNABLE TO SLEEP      Medication List    STOP taking these medications   doxycycline 100 MG tablet Commonly known as: VIBRA-TABS   pantoprazole 40 MG tablet Commonly known as: Protonix  TAKE these medications   acetaminophen 500 MG tablet Commonly known as: TYLENOL Take 1,000 mg by mouth  every 6 (six) hours as needed for moderate pain or headache.   ARIPiprazole 5 MG tablet Commonly known as: ABILIFY Take 1 tablet (5 mg total) by mouth daily.   azelastine 0.1 % nasal spray Commonly known as: ASTELIN Place 1 spray into both nostrils 2 (two) times daily. Use in each nostril as directed What changed:   when to take this  reasons to take this   carvedilol 12.5 MG tablet Commonly known as: COREG Take 1 tablet (12.5 mg total) by mouth 2 (two) times daily.   cholecalciferol 1000 units tablet Commonly known as: VITAMIN D Take 1,000 Units by mouth daily.   Combigan 0.2-0.5 % ophthalmic solution Generic drug: brimonidine-timolol INSTILL ONE DROP IN [facility] EYE TWICE DAILY What changed: See the new instructions.   cyclobenzaprine 5 MG tablet Commonly known as: FLEXERIL TAKE ONE TABLET BY MOUTH THREE TIMES DAILY AS NEEDED FOR MUSCLE SPASM What changed:   reasons to take this  additional instructions   dexamethasone 4 MG tablet Commonly known as: DECADRON Take 1 tablet (4 mg total) by mouth daily. What changed: when to take this   fluticasone 50 MCG/ACT nasal spray Commonly known as: FLONASE Place 2 sprays into both nostrils daily as needed for allergies or rhinitis.   FreeStyle Libre 14 Day Sensor Misc Inject 1 each into the skin every 14 (fourteen) days. Use as directed.   gabapentin 100 MG capsule Commonly known as: NEURONTIN Take 1 capsule (100 mg total) by mouth 3 (three) times daily. (Needs to be seen before next refill)   hydrALAZINE 50 MG tablet Commonly known as: APRESOLINE Take 1 tablet (50 mg total) by mouth 2 (two) times daily.   Lantus SoloStar 100 UNIT/ML Solostar Pen Generic drug: insulin glargine INJECT 18-50 UNITS SUBCUTANEOUSLY AT BEDTIME. What changed: See the new instructions.   linagliptin 5 MG Tabs tablet Commonly known as: Tradjenta Take 1 tablet (5 mg total) by mouth daily. What changed: when to take this   multivitamin Tabs  tablet Take 1 tablet by mouth daily.   Prodigy No Coding Blood Gluc test strip Generic drug: glucose blood USE TO CHECK BLOOD SUGAR TWICE DAILY. Dx E11.22   Prodigy Twist Top Lancets 28G Misc USE TO CHECK BLOOD SUGAR UP TO FOUR TIMES DAILY.   sertraline 100 MG tablet Commonly known as: ZOLOFT Take 1.5 tablets (150 mg total) by mouth daily. What changed: when to take this   sevelamer carbonate 800 MG tablet Commonly known as: RENVELA Take 800-2,400 mg by mouth See admin instructions. Take 245m by mouth three times daily with means and 8055mwith snacks   Symbicort 160-4.5 MCG/ACT inhaler Generic drug: budesonide-formoterol Inhale 2 puffs into the lungs 2 (two) times daily.   traZODone 100 MG tablet Commonly known as: DESYREL Take 100 mg by mouth at bedtime as needed for sleep.   TRUEplus Pen Needles 31G X 5 MM Misc Generic drug: Insulin Pen Needle Use daily with insulin Dx E11.22   Ventolin HFA 108 (90 Base) MCG/ACT inhaler Generic drug: albuterol Inhale 2 puffs into the lungs every 4 (four) hours as needed for wheezing or shortness of breath.       Allergies  Allergen Reactions  . Sulfamethoxazole-Trimethoprim Nausea And Vomiting  . Bactrim [Sulfamethoxazole-Trimethoprim] Nausea And Vomiting  . Prednisone Other (See Comments)    "I was wide open and couldn't eat" per pt. Loss of  appetite and insomnia  LOSS OF APPETITE,UNABLE TO SLEEP    Consultations:  Renal on phone   Procedures/Studies: DG Chest Portable 1 View  Result Date: 12/31/2020 CLINICAL DATA:  Shortness of breath and dizziness EXAM: PORTABLE CHEST 1 VIEW COMPARISON:  Portable exam 1512 hours compared to 10/05/2020 FINDINGS: Enlargement of cardiac silhouette. Mediastinal contours and pulmonary vascularity normal. Atherosclerotic calcification aorta. Lungs clear. No pulmonary infiltrate, pleural effusion, or pneumothorax. Bones demineralized. Vascular stents in RIGHT upper extremity. IMPRESSION:  Enlargement of cardiac silhouette. No acute abnormalities. Aortic Atherosclerosis (ICD10-I70.0). Electronically Signed   By: Lavonia Dana M.D.   On: 12/31/2020 15:20   Intravitreal Injection, Pharmacologic Agent - OD - Right Eye  Result Date: 12/07/2020 Time Out 12/07/2020. 10:39 AM. Confirmed correct patient, procedure, site, and patient consented. Anesthesia Topical anesthesia was used. Anesthetic medications included Akten 3.5%, Proparacaine 0.5%. Procedure Preparation included Tobramycin 0.3%, 10% betadine to eyelids. A supplied needle was used. Injection: 2.5 mg Bevacizumab (AVASTIN) 2.52m/0.1mL SOSY   NDC:: 62947-654-65 Lot:: [id number]  Route: Intravitreal, Site: Right Eye Post-op Post injection exam found visual acuity of at least counting fingers. The patient tolerated the procedure well. There were no complications. The patient received written and verbal post procedure care education. Post injection medications were not given.   OCT, Retina - OU - Both Eyes  Result Date: 12/07/2020 Right Eye Quality was good. Scan locations included subfoveal. Central Foveal Thickness: 201. Progression has been stable. Findings include abnormal foveal contour. Left Eye Quality was borderline. Scan locations included temporal. Progression has been stable. Findings include abnormal foveal contour. Notes History of CSME OD, stabilized and improved on recurrent intravitreal Avastin also to prevent recurrent microscopic vitreous hemorrhages in this monocular patient       Discharge Exam: Vitals:   12/31/20 2126 01/01/21 0350  BP:  (!) 157/85  Pulse:  66  Resp:  15  Temp:  98.7 F (37.1 C)  SpO2: 96% 95%   Vitals:   12/31/20 1916 12/31/20 2039 12/31/20 2126 01/01/21 0350  BP: (!) 152/85 (!) 189/90  (!) 157/85  Pulse: 66 63  66  Resp: 20 16  15   Temp: 97.9 F (36.6 C) 98 F (36.7 C)  98.7 F (37.1 C)  TempSrc: Oral Oral    SpO2: 94% 97% 96% 95%  Weight:        General: Pt is alert, awake, not in  acute distress Cardiovascular: RRR, S1/S2 +, no rubs, no gallops Respiratory: scattered rales.  Bibasilar wheeze Abdominal: Soft, NT, ND, bowel sounds + Extremities: no edema, no cyanosis   The results of significant diagnostics from this hospitalization (including imaging, microbiology, ancillary and laboratory) are listed below for reference.    Significant Diagnostic Studies: DG Chest Portable 1 View  Result Date: 12/31/2020 CLINICAL DATA:  Shortness of breath and dizziness EXAM: PORTABLE CHEST 1 VIEW COMPARISON:  Portable exam 1512 hours compared to 10/05/2020 FINDINGS: Enlargement of cardiac silhouette. Mediastinal contours and pulmonary vascularity normal. Atherosclerotic calcification aorta. Lungs clear. No pulmonary infiltrate, pleural effusion, or pneumothorax. Bones demineralized. Vascular stents in RIGHT upper extremity. IMPRESSION: Enlargement of cardiac silhouette. No acute abnormalities. Aortic Atherosclerosis (ICD10-I70.0). Electronically Signed   By: MLavonia DanaM.D.   On: 12/31/2020 15:20   Intravitreal Injection, Pharmacologic Agent - OD - Right Eye  Result Date: 12/07/2020 Time Out 12/07/2020. 10:39 AM. Confirmed correct patient, procedure, site, and patient consented. Anesthesia Topical anesthesia was used. Anesthetic medications included Akten 3.5%, Proparacaine 0.5%. Procedure Preparation included Tobramycin 0.3%,  10% betadine to eyelids. A supplied needle was used. Injection: 2.5 mg Bevacizumab (AVASTIN) 2.64m/0.1mL SOSY   NDC:: 57262-035-59 Lot:: [id number]  Route: Intravitreal, Site: Right Eye Post-op Post injection exam found visual acuity of at least counting fingers. The patient tolerated the procedure well. There were no complications. The patient received written and verbal post procedure care education. Post injection medications were not given.   OCT, Retina - OU - Both Eyes  Result Date: 12/07/2020 Right Eye Quality was good. Scan locations included subfoveal. Central  Foveal Thickness: 201. Progression has been stable. Findings include abnormal foveal contour. Left Eye Quality was borderline. Scan locations included temporal. Progression has been stable. Findings include abnormal foveal contour. Notes History of CSME OD, stabilized and improved on recurrent intravitreal Avastin also to prevent recurrent microscopic vitreous hemorrhages in this monocular patient    Microbiology: Recent Results (from the past 240 hour(s))  Resp Panel by RT-PCR (Flu A&B, Covid) Nasopharyngeal Swab     Status: None   Collection Time: 12/31/20  2:52 PM   Specimen: Nasopharyngeal Swab; Nasopharyngeal(NP) swabs in vial transport medium  Result Value Ref Range Status   SARS Coronavirus 2 by RT PCR NEGATIVE NEGATIVE Final    Comment: (NOTE) SARS-CoV-2 target nucleic acids are NOT DETECTED.  The SARS-CoV-2 RNA is generally detectable in upper respiratory specimens during the acute phase of infection. The lowest concentration of SARS-CoV-2 viral copies this assay can detect is 138 copies/mL. A negative result does not preclude SARS-Cov-2 infection and should not be used as the sole basis for treatment or other patient management decisions. A negative result may occur with  improper specimen collection/handling, submission of specimen other than nasopharyngeal swab, presence of viral mutation(s) within the areas targeted by this assay, and inadequate number of viral copies(<138 copies/mL). A negative result must be combined with clinical observations, patient history, and epidemiological information. The expected result is Negative.  Fact Sheet for Patients:  hEntrepreneurPulse.com.au Fact Sheet for Healthcare Providers:  hIncredibleEmployment.be This test is no t yet approved or cleared by the [place] and  has been authorized for detection and/or diagnosis of SARS-CoV-2 by FDA under an Emergency Use Authorization (EUA). This EUA  will remain  in effect (meaning this test can be used) for the duration of the COVID-19 declaration under Section 564(b)(1) of the Act, 21 U.S.C.section 360bbb-3(b)(1), unless the authorization is terminated  or revoked sooner.       Influenza A by PCR NEGATIVE NEGATIVE Final   Influenza B by PCR NEGATIVE NEGATIVE Final    Comment: (NOTE) The Xpert Xpress SARS-CoV-2/FLU/RSV plus assay is intended as an aid in the diagnosis of influenza from Nasopharyngeal swab specimens and should not be used as a sole basis for treatment. Nasal washings and aspirates are unacceptable for Xpert Xpress SARS-CoV-2/FLU/RSV testing.  Fact Sheet for Patients: hEntrepreneurPulse.com.au Fact Sheet for Healthcare Providers: hIncredibleEmployment.be This test is not yet approved or cleared by the [place] and has been authorized for detection and/or diagnosis of SARS-CoV-2 by FDA under an Emergency Use Authorization (EUA). This EUA will remain in effect (meaning this test can be used) for the duration of the COVID-19 declaration under Section 564(b)(1) of the Act, 21 U.S.C. section 360bbb-3(b)(1), unless the authorization is terminated or revoked.  Performed at [facility] [address], [place] [address]     Labs: Basic Metabolic Panel: Recent Labs  Lab 12/28/20 1354 12/31/20 1343 01/01/21 0427  NA 133*  135 135  K 4.1 4.0 4.2  CL 97* 98 99  CO2 27 28 26   GLUCOSE 93 145* 159*  BUN 41* 32* 36*  CREATININE 7.82* 7.47* 8.03*  CALCIUM 8.9 9.0 8.9   Liver Function Tests: Recent Labs  Lab 12/28/20 1354 12/31/20 1343  AST 17 15  ALT 16 14  ALKPHOS 82 82  BILITOT 0.6 0.6  PROT 7.3 6.8  ALBUMIN 3.7 3.5   No results for input(s): LIPASE, AMYLASE in the last 168 hours. No results for input(s): AMMONIA in the last 168 hours. CBC: Recent Labs  Lab 12/28/20 1354 12/31/20 1343 12/31/20 2101 01/01/21 0427  WBC 6.4 6.5  --  7.0   NEUTROABS 4.9  --   --   --   HGB 7.0* 6.6* 8.2* 7.7*  HCT 24.8* 23.0* 27.8* 26.3*  MCV 82.9 83.0  --  83.8  PLT 224 211  --  206   Cardiac Enzymes: No results for input(s): CKTOTAL, CKMB, CKMBINDEX, TROPONINI in the last 168 hours. BNP: Invalid input(s): POCBNP CBG: Recent Labs  Lab 12/31/20 2118 01/01/21 0034 01/01/21 0639  GLUCAP 124* 190* 143*    Time coordinating discharge:  36 minutes  Signed:  Orson Eva, DO [facility] Pager: [phone] 01/01/2021, 8:13 AM

## 2021-01-01 NOTE — Consult Note (Signed)
Consulting  Provider: Dr. Denton Brick Primary Care Physician:  Medicine, [facility] Internal Primary Gastroenterologist:  Dr. Abbey Chatters  Reason for Consultation: Anemia  HPI:  Kara Knapp is a 50 y.o. female with a past medical history of ESRD on hemodialysis, diabetes, hypertension, blindness, cocaine use, COPD, questionable early cirrhosis, who was admitted to [facility] yesterday evening after initially presenting with dizziness and shortness of breath.  Patient states she has had generalized weakness as well as dizziness over the last week but worsened over the last 48 hours.  Denies any abdominal swelling or lower extremity edema.  No confusion.  Denies any nausea or vomiting.  She is legally blind and unable to tell the color of her stool.  No abdominal pain.  In the ER, patient was found to have a hemoglobin of 6.6 and transfuse 1 unit of PRBCs, hemoglobin 7.7 today.  Platelets WNL, stool occult negative.  Chest x-ray WNL.  From a GI standpoint she has a history of chronic GERD which is relatively well controlled on pantoprazole.  Also has chronic liver disease with liver biopsy July 2019 showing stage II of 4 bridging fibrosis marked secondary iron overload likely due to iron infusions during dialysis.  Most recent ultrasound 07/08/2020 did show nodular liver concerning for cirrhosis.  No complications from cirrhosis including hypervolemia, encephalopathy, previous GI bleeding.  Last EGD and colonoscopy by Dr. Oneida Alar in 2018 showed moderate gastritis negative for H. pylori, internal hemorrhoids, multiple polyps removed, some of which were tubular adenomas with a 5-year recall.  Past Medical History:  Diagnosis Date  . Anemia of chronic disease   . Anxiety   . Asthma   . Blind left eye   . Bronchitis   . Cataract   . Cholecystitis, acute 05/26/2013   Status post cholecystectomy  . Chronic abdominal pain   . Chronic diarrhea   . COPD (chronic obstructive pulmonary disease) ([place])    . Depression   . Diabetic foot ulcer ([place]) 03/01/2015  . Diastolic heart failure ([place])   . ESRD on hemodialysis ([place])    Started diaylsis 12/29/15  . Essential hypertension   . Fibroids   . Glaucoma   . History of blood transfusion   . History of pneumonia   . Hyperlipidemia   . Insulin-dependent diabetes mellitus with retinopathy   . Liver fibrosis    Negative Hep B surface antigen, negative Hep C antibody Feb 2018 (see scanned in labs).  . Neuropathy   . Osteomyelitis ([place])    Toe on left foot    Past Surgical History:  Procedure Laterality Date  . A/V SHUNTOGRAM N/A 10/25/2016   Procedure: A/V Shuntogram - Right Arm;  Surgeon: Waynetta Sandy, MD;  Location: [place] CV LAB;  Service: Cardiovascular;  Laterality: N/A;  . A/V SHUNTOGRAM N/A 03/05/2018   Procedure: A/V SHUNTOGRAM - Right Arm;  Surgeon: Waynetta Sandy, MD;  Location: [place] [facility];  Service: Cardiovascular;  Laterality: N/A;  . AV FISTULA PLACEMENT Right 10/17/2015   Procedure: INSERTION OF ARTERIOVENOUS GORE-TEX GRAFT RIGHT UPPER ARM WITH ACUSEAL;  Surgeon: Conrad Slabtown, MD;  [facility];  Service: Vascular;  Laterality: Right;  . AV FISTULA PLACEMENT Left 11/18/2019   Procedure: INSERTION OF ARTERIOVENOUS (AV) GORE-TEX GRAFT left  ARM;  Surgeon: Serafina Mitchell, MD;  Location: [place];  Service: Vascular;  Laterality: Left;  . CATARACT EXTRACTION W/ INTRAOCULAR LENS IMPLANT Bilateral   . CESAREAN SECTION    . CHOLECYSTECTOMY N/A  05/25/2013   Procedure: LAPAROSCOPIC CHOLECYSTECTOMY;  Surgeon: Jamesetta So, MD;  Location: [facility];  Service: General;  Laterality: N/A;  . COLONOSCOPY WITH PROPOFOL N/A 10/02/2016   two 3 to 5 mm polyps in the descending colon, three 2 to 3 mm polyps in the rectum, random colon biopsies, rectal bleeding due to internal hemorrhoids, friability with no bleeding at the anus status post biopsy.  Surgical pathology found the polyps to be a mix of hyperplastic and  tubular adenoma, random colon biopsies to be benign colonic mucosa, and the anal biopsies to be anal skin tag.   Marland Kitchen ESOPHAGOGASTRODUODENOSCOPY (EGD) WITH PROPOFOL N/A 10/02/2016   mucosal nodule in the esophagus status post biopsy, moderate gastritis status post biopsy, mild duodenitis. esophageal biopsy to be benign, gastric biopsies to be gastritis due to aspirin use, and duodenal biopsies to be duodenitis due to aspirin use  . EYE SURGERY Bilateral   . IR FLUORO GUIDE CV LINE RIGHT  10/09/2019  . IR REMOVAL TUN CV CATH W/O FL  01/15/2020  . IR THROMBECTOMY AV FISTULA W/THROMBOLYSIS/PTA INC/SHUNT/IMG RIGHT Right 12/26/2018  . IR THROMBECTOMY AV FISTULA W/THROMBOLYSIS/PTA INC/SHUNT/IMG RIGHT Right 08/10/2019  . IR THROMBECTOMY AV FISTULA W/THROMBOLYSIS/PTA/STENT INC/SHUNT/IMG RT Right 08/08/2018  . IR THROMBECTOMY AV FISTULA W/THROMBOLYSIS/PTA/STENT INC/SHUNT/IMG RT Right 05/15/2019  . IR US GUIDE VASC ACCESS RIGHT  08/08/2018  . IR US GUIDE VASC ACCESS RIGHT  12/26/2018  . IR US GUIDE VASC ACCESS RIGHT  05/15/2019  . IR US GUIDE VASC ACCESS RIGHT  08/10/2019  . IR US GUIDE VASC ACCESS RIGHT  10/09/2019  . PARS PLANA VITRECTOMY Left 11/24/2014   Procedure: PARS PLANA VITRECTOMY WITH 25 GAUGE;  Surgeon: Hurman Horn, MD;  Location: [place];  Service: Ophthalmology;  Laterality: Left;  . PERIPHERAL VASCULAR BALLOON ANGIOPLASTY Right 03/05/2018   Procedure: PERIPHERAL VASCULAR BALLOON ANGIOPLASTY;  Surgeon: Waynetta Sandy, MD;  Location: [place] [facility] LAB;  Service: Cardiovascular;  Laterality: Right;  arm fistula  . PERIPHERAL VASCULAR CATHETERIZATION N/A 04/28/2015   Procedure: Bilateral Upper Extremity Venography;  Surgeon: Conrad Kaktovik, MD;  Location: [place] [facility] LAB;  Service: Cardiovascular;  Laterality: N/A;  . PHOTOCOAGULATION WITH LASER Left 11/24/2014   Procedure: PHOTOCOAGULATION WITH LASER;  Surgeon: Hurman Horn, MD;  Location: [place];  Service: Ophthalmology;  Laterality: Left;  with  insertion of silicone oil  . SAVORY DILATION N/A 10/02/2016   Procedure: SAVORY DILATION;  Surgeon: Danie Binder, MD;  [facility];  Service: Endoscopy;  Laterality: N/A;  . TUBAL LIGATION    . UPPER EXTREMITY VENOGRAPHY Bilateral 11/02/2019   Procedure: UPPER EXTREMITY VENOGRAPHY;  Surgeon: Waynetta Sandy, MD;  Location: [place] [facility];  Service: Cardiovascular;  Laterality: Bilateral;    Prior to Admission medications   Medication Sig Start Date End Date Taking? Authorizing Provider  acetaminophen (TYLENOL) 500 MG tablet Take 1,000 mg by mouth every 6 (six) hours as needed for moderate pain or headache.   Yes [provider]  ARIPiprazole (ABILIFY) 5 MG tablet Take 1 tablet (5 mg total) by mouth daily. 12/14/20 03/14/21 Yes Hisada, Elie Goody, MD  azelastine (ASTELIN) 0.1 % nasal spray Place 1 spray into both nostrils 2 (two) times daily. Use in each nostril as directed Patient taking differently: Place 1 spray into both nostrils 2 (two) times daily as needed for rhinitis or allergies. Use in each nostril as directed 08/29/18  Yes Dettinger, Fransisca Kaufmann, MD  carvedilol (COREG) 12.5  MG tablet Take 1 tablet (12.5 mg total) by mouth 2 (two) times daily. 10/07/20  Yes Emokpae, Courage, MD  cholecalciferol (VITAMIN D) 1000 units tablet Take 1,000 Units by mouth daily.    Yes [provider]  COMBIGAN 0.2-0.5 % ophthalmic solution INSTILL ONE DROP IN [facility] EYE TWICE DAILY Patient taking differently: Place 1 drop into both eyes at bedtime. 05/17/20  Yes Rankin, Clent Demark, MD  Continuous Blood Gluc Sensor (FREESTYLE LIBRE 14 DAY SENSOR) MISC Inject 1 each into the skin every 14 (fourteen) days. Use as directed. 11/27/17  Yes Nida, Marella Chimes, MD  cyclobenzaprine (FLEXERIL) 5 MG tablet TAKE ONE TABLET BY MOUTH THREE TIMES DAILY AS NEEDED FOR MUSCLE SPASM Patient taking differently: Take 5 mg by mouth 3 (three) times daily as needed for muscle spasms. 12/18/19  Yes Dettinger,  Fransisca Kaufmann, MD  fluticasone (FLONASE) 50 MCG/ACT nasal spray Place 2 sprays into both nostrils daily as needed for allergies or rhinitis. 10/14/20  Yes [provider]  gabapentin (NEURONTIN) 100 MG capsule Take 1 capsule (100 mg total) by mouth 3 (three) times daily. (Needs to be seen before next refill) 11/25/19  Yes Dettinger, Fransisca Kaufmann, MD  glucose blood (PRODIGY NO CODING BLOOD GLUC) test strip USE TO CHECK BLOOD SUGAR TWICE DAILY. Dx E11.22 02/03/19  Yes Dettinger, Fransisca Kaufmann, MD  hydrALAZINE (APRESOLINE) 50 MG tablet Take 1 tablet (50 mg total) by mouth 2 (two) times daily. 10/07/20  Yes Roxan Hockey, MD  Insulin Pen Needle (TRUEPLUS PEN NEEDLES) 31G X 5 MM MISC Use daily with insulin Dx E11.22 03/09/19  Yes Dettinger, Fransisca Kaufmann, MD  LANTUS SOLOSTAR 100 UNIT/ML Solostar Pen INJECT 18-50 UNITS SUBCUTANEOUSLY AT BEDTIME. Patient taking differently: Inject 20 Units into the skin at bedtime. 10/01/19  Yes Dettinger, Fransisca Kaufmann, MD  linagliptin (TRADJENTA) 5 MG TABS tablet Take 1 tablet (5 mg total) by mouth daily. Patient taking differently: Take 5 mg by mouth every evening. 03/18/19  Yes Dettinger, Fransisca Kaufmann, MD  multivitamin (RENA-VIT) TABS tablet Take 1 tablet by mouth daily. 04/03/19  Yes [provider]  PRODIGY TWIST TOP LANCETS 28G MISC USE TO CHECK BLOOD SUGAR UP TO FOUR TIMES DAILY. 03/13/17  Yes Timmothy Euler, MD  sertraline (ZOLOFT) 100 MG tablet Take 1.5 tablets (150 mg total) by mouth daily. Patient taking differently: Take 150 mg by mouth at bedtime. 12/04/20  Yes [name], MD  sevelamer carbonate (RENVELA) 800 MG tablet Take 800-2,400 mg by mouth See admin instructions. Take 2451m by mouth three times daily with means and 8045mwith snacks   Yes [provider]  SYMBICORT 160-4.5 MCG/ACT inhaler Inhale 2 puffs into the lungs 2 (two) times daily. 10/07/20  Yes EmRoxan HockeyMD  traZODone (DESYREL) 100 MG tablet Take 100 mg by mouth at bedtime as needed for  sleep. 12/27/20  Yes [provider]  VENTOLIN HFA 108 (90 Base) MCG/ACT inhaler Inhale 2 puffs into the lungs every 4 (four) hours as needed for wheezing or shortness of breath. 10/07/20  Yes Emokpae, Courage, MD  dexamethasone (DECADRON) 4 MG tablet Take 1 tablet (4 mg total) by mouth daily with breakfast. Patient not taking: Reported on 12/31/2020 10/07/20   EmRoxan HockeyMD  dexamethasone (DECADRON) 4 MG tablet Take 1 tablet (4 mg total) by mouth daily. 01/01/21   TaOrson EvaMD  doxycycline (VIBRA-TABS) 100 MG tablet Take 1 tablet (100 mg total) by mouth 2 (two) times daily. Patient not taking: Reported on  12/31/2020 10/07/20   Roxan Hockey, MD  pantoprazole (PROTONIX) 40 MG tablet TAKE 1 BY MOUTH DAILY 30 minutes before breakfast Patient not taking: Reported on 12/31/2020 02/02/19   Dettinger, Fransisca Kaufmann, MD    Current Facility-Administered Medications  Medication Dose Route Frequency Provider Last Rate Last Admin  . acetaminophen (TYLENOL) tablet 650 mg  650 mg Oral Q6H PRN Emokpae, Ejiroghene E, MD       Or  . acetaminophen (TYLENOL) suppository 650 mg  650 mg Rectal Q6H PRN Emokpae, Ejiroghene E, MD      . ARIPiprazole (ABILIFY) tablet 5 mg  5 mg Oral Daily Emokpae, Ejiroghene E, MD      . budesonide (PULMICORT) nebulizer solution 0.5 mg  0.5 mg Nebulization BID Tat, David, MD   0.5 mg at 01/01/21 0816  . carvedilol (COREG) tablet 12.5 mg  12.5 mg Oral BID Emokpae, Ejiroghene E, MD   12.5 mg at 01/01/21 0823  . dexamethasone (DECADRON) tablet 4 mg  4 mg Oral Daily Tat, David, MD   4 mg at 01/01/21 0831  . hydrALAZINE (APRESOLINE) tablet 50 mg  50 mg Oral BID Emokpae, Ejiroghene E, MD   50 mg at 01/01/21 0823  . insulin aspart (novoLOG) injection 0-5 Units  0-5 Units Subcutaneous QHS Tat, David, MD      . insulin aspart (novoLOG) injection 0-9 Units  0-9 Units Subcutaneous TID WC Orson Eva, MD   2 Units at 01/01/21 1124  . ipratropium-albuterol (DUONEB) 0.5-2.5 (3) MG/3ML nebulizer  solution 3 mL  3 mL Nebulization Q4H PRN Emokpae, Ejiroghene E, MD      . ipratropium-albuterol (DUONEB) 0.5-2.5 (3) MG/3ML nebulizer solution 3 mL  3 mL Nebulization Q6H Orson Eva, MD   3 mL at 01/01/21 0816  . iron sucrose (VENOFER) 400 mg in sodium chloride 0.9 % 250 mL IVPB  400 mg Intravenous Once Tat, David, MD      . ondansetron (ZOFRAN) tablet 4 mg  4 mg Oral Q6H PRN Emokpae, Ejiroghene E, MD       Or  . ondansetron (ZOFRAN) injection 4 mg  4 mg Intravenous Q6H PRN Emokpae, Ejiroghene E, MD      . pantoprazole (PROTONIX) injection 40 mg  40 mg Intravenous Q24H Emokpae, Ejiroghene E, MD   40 mg at 12/31/20 2155  . polyethylene glycol (MIRALAX / GLYCOLAX) packet 17 g  17 g Oral Daily PRN Emokpae, Ejiroghene E, MD      . polyethylene glycol powder (GLYCOLAX/MIRALAX) container 255 g  1 Container Oral Once Hurshel Keys K, DO      . sertraline (ZOLOFT) tablet 150 mg  150 mg Oral Daily Emokpae, Ejiroghene E, MD   150 mg at 12/31/20 2157  . sevelamer carbonate (RENVELA) tablet 2,400 mg  2,400 mg Oral TID with meals Emokpae, Ejiroghene E, MD   2,400 mg at 01/01/21 0830  . sevelamer carbonate (RENVELA) tablet 800 mg  800 mg Oral With snacks Emokpae, Ejiroghene E, MD        Allergies as of 12/31/2020 - Review Complete 12/31/2020  Allergen Reaction Noted  . Sulfamethoxazole-trimethoprim Nausea And Vomiting 07/13/2015  . Bactrim [sulfamethoxazole-trimethoprim] Nausea And Vomiting 05/21/2013  . Prednisone Other (See Comments) 07/09/2013    Family History  Problem Relation Age of Onset  . COPD Mother   . Cancer Father   . Lymphoma Father   . Diabetes Sister   . Deep vein thrombosis Sister   . Diabetes Brother   . Hyperlipidemia Brother   .  Hypertension Brother   . Mental retardation Sister   . Alcohol abuse Paternal Grandmother   . Colon cancer Neg Hx   . Liver disease Neg Hx     Social History   Socioeconomic History  . Marital status: Single    Spouse name: Not on file  .  Number of children: 5  . Years of education: GED  . Highest education level: Not on file  Occupational History  . Not on file  Tobacco Use  . Smoking status: Current Every Day Smoker    Packs/day: 1.00    Years: 23.00    Pack years: 23.00    Types: Cigarettes    Start date: 12/03/2000  . Smokeless tobacco: Never Used  . Tobacco comment: one pack daily  Vaping Use  . Vaping Use: Never used  Substance and Sexual Activity  . Alcohol use: No    Alcohol/week: 0.0 standard drinks  . Drug use: No    Comment: Sober for 8 years  . Sexual activity: Not Currently    Birth control/protection: Surgical  Other Topics Concern  . Not on file  Social History Narrative  . Not on file   Social Determinants of Health   Financial Resource Strain: Not on file  Food Insecurity: Not on file  Transportation Needs: Not on file  Physical Activity: Not on file  Stress: Not on file  Social Connections: Not on file  Intimate Partner Violence: Not on file    Review of Systems: General: Negative for anorexia, weight loss, fever, chills, does note fatigue, weakness. Eyes: Blind  ENT: Negative for hoarseness, difficulty swallowing , nasal congestion. CV: Negative for chest pain, angina, palpitations, dyspnea on exertion, peripheral edema.  Respiratory: Negative for dyspnea at rest, dyspnea on exertion, cough, sputum, wheezing.  GI: See history of present illness. GU:  Negative for dysuria, hematuria, urinary incontinence, urinary frequency, nocturnal urination.  MS: Negative for joint pain, low back pain.  Derm: Negative for rash or itching.  Neuro: Negative for weakness, abnormal sensation, seizure, frequent headaches, memory loss, confusion.  Psych: Negative for anxiety, depression Endo: Negative for unusual weight change.  Heme: Negative for bruising or bleeding. Allergy: Negative for rash or hives.  Physical Exam: Vital signs in last 24 hours: Temp:  [97.9 F (36.6 C)-98.7 F (37.1 C)]  98.6 F (37 C) (06/05 0819) Pulse Rate:  [63-72] 65 (06/05 0819) Resp:  [15-25] 17 (06/05 0819) BP: (116-189)/(64-101) 152/79 (06/05 0819) SpO2:  [92 %-97 %] 95 % (06/05 0819) Weight:  [86.5 kg] 86.5 kg (06/04 1347) Last BM Date: 12/31/20 General:   Alert,  Well-developed, well-nourished, pleasant and cooperative in NAD Head:  Normocephalic and atraumatic. Eyes:  Sclera clear, no icterus.   Conjunctiva pink. Ears:  Normal auditory acuity. Nose:  No deformity, discharge,  or lesions. Mouth:  No deformity or lesions, dentition normal. Neck:  Supple; no masses or thyromegaly. Lungs:  Clear throughout to auscultation.   No wheezes, crackles, or rhonchi. No acute distress. Heart:  Regular rate and rhythm; no murmurs, clicks, rubs,  or gallops. Abdomen:  Soft, nontender and nondistended. No masses, hepatosplenomegaly or hernias noted. Normal bowel sounds, without guarding, and without rebound.   Msk:  Symmetrical without gross deformities. Normal posture. Pulses:  Normal pulses noted. Extremities:  Without clubbing or edema. Neurologic:  Alert and  oriented x4;  grossly normal neurologically. Skin:  Intact without significant lesions or rashes. Cervical Nodes:  No significant cervical adenopathy. Psych:  Alert and cooperative. Normal  mood and affect.  Intake/Output from previous day: 06/04 0701 - 06/05 0700 In: 630 [Blood:630] Out: -  Intake/Output this shift: Total I/O In: 480 [P.O.:480] Out: -   Lab Results: Recent Labs    12/31/20 1343 12/31/20 2101 01/01/21 0427  WBC 6.5  --  7.0  HGB 6.6* 8.2* 7.7*  HCT 23.0* 27.8* 26.3*  PLT 211  --  206   BMET Recent Labs    12/31/20 1343 01/01/21 0427  NA 135 135  K 4.0 4.2  CL 98 99  CO2 28 26  GLUCOSE 145* 159*  BUN 32* 36*  CREATININE 7.47* 8.03*  CALCIUM 9.0 8.9   LFT Recent Labs    12/31/20 1343  PROT 6.8  ALBUMIN 3.5  AST 15  ALT 14  ALKPHOS 82  BILITOT 0.6   PT/INR No results for input(s): LABPROT, INR  in the last 72 hours. Hepatitis Panel No results for input(s): HEPBSAG, HCVAB, HEPAIGM, HEPBIGM in the last 72 hours. C-Diff No results for input(s): CDIFFTOX in the last 72 hours.  Studies/Results: DG Chest Portable 1 View  Result Date: 12/31/2020 CLINICAL DATA:  Shortness of breath and dizziness EXAM: PORTABLE CHEST 1 VIEW COMPARISON:  Portable exam 1512 hours compared to 10/05/2020 FINDINGS: Enlargement of cardiac silhouette. Mediastinal contours and pulmonary vascularity normal. Atherosclerotic calcification aorta. Lungs clear. No pulmonary infiltrate, pleural effusion, or pneumothorax. Bones demineralized. Vascular stents in RIGHT upper extremity. IMPRESSION: Enlargement of cardiac silhouette. No acute abnormalities. Aortic Atherosclerosis (ICD10-I70.0). Electronically Signed   By: Lavonia Dana M.D.   On: 12/31/2020 15:20    Impression: *Iron deficiency anemia-worsening *Cirrhosis *GERD  Plan: Etiology of patient's iron deficiency anemia is likely multifactorial with ESRD, cannot rule out occult GI blood loss.  Discussed in depth with patient today.  We will plan on EGD and colonoscopy tomorrow to further evaluate her anemia. The risks including infection, bleed, or perforation as well as benefits, limitations, alternatives and imponderables have been reviewed with the patient. Questions have been answered. All parties agreeable.  Continue to monitor H&H and transfuse for less than 7.  Iron infusion today.  Continue on twice daily Protonix.  Thank you for letting us take part in this patient's care, further recommendations to follow.   Elon Alas. Abbey Chatters, D.O. Gastroenterology and Hepatology [facility]    LOS: 0 days     01/01/2021, 11:28 AM

## 2021-01-01 NOTE — H&P (View-Only) (Signed)
Consulting  Provider: Dr. Denton Brick Primary Care Physician:  Medicine, [facility] Internal Primary Gastroenterologist:  Dr. Abbey Chatters  Reason for Consultation: Anemia  HPI:  Kara Knapp is a 50 y.o. female with a past medical history of ESRD on hemodialysis, diabetes, hypertension, blindness, cocaine use, COPD, questionable early cirrhosis, who was admitted to [facility] yesterday evening after initially presenting with dizziness and shortness of breath.  Patient states she has had generalized weakness as well as dizziness over the last week but worsened over the last 48 hours.  Denies any abdominal swelling or lower extremity edema.  No confusion.  Denies any nausea or vomiting.  She is legally blind and unable to tell the color of her stool.  No abdominal pain.  In the ER, patient was found to have a hemoglobin of 6.6 and transfuse 1 unit of PRBCs, hemoglobin 7.7 today.  Platelets WNL, stool occult negative.  Chest x-ray WNL.  From a GI standpoint she has a history of chronic GERD which is relatively well controlled on pantoprazole.  Also has chronic liver disease with liver biopsy July 2019 showing stage II of 4 bridging fibrosis marked secondary iron overload likely due to iron infusions during dialysis.  Most recent ultrasound 07/08/2020 did show nodular liver concerning for cirrhosis.  No complications from cirrhosis including hypervolemia, encephalopathy, previous GI bleeding.  Last EGD and colonoscopy by Dr. Oneida Alar in 2018 showed moderate gastritis negative for H. pylori, internal hemorrhoids, multiple polyps removed, some of which were tubular adenomas with a 5-year recall.  Past Medical History:  Diagnosis Date  . Anemia of chronic disease   . Anxiety   . Asthma   . Blind left eye   . Bronchitis   . Cataract   . Cholecystitis, acute 05/26/2013   Status post cholecystectomy  . Chronic abdominal pain   . Chronic diarrhea   . COPD (chronic obstructive pulmonary disease) ([place])    . Depression   . Diabetic foot ulcer ([place]) 03/01/2015  . Diastolic heart failure ([place])   . ESRD on hemodialysis ([place])    Started diaylsis 12/29/15  . Essential hypertension   . Fibroids   . Glaucoma   . History of blood transfusion   . History of pneumonia   . Hyperlipidemia   . Insulin-dependent diabetes mellitus with retinopathy   . Liver fibrosis    Negative Hep B surface antigen, negative Hep C antibody Feb 2018 (see scanned in labs).  . Neuropathy   . Osteomyelitis ([place])    Toe on left foot    Past Surgical History:  Procedure Laterality Date  . A/V SHUNTOGRAM N/A 10/25/2016   Procedure: A/V Shuntogram - Right Arm;  Surgeon: Waynetta Sandy, MD;  Location: [place] [facility];  Service: Cardiovascular;  Laterality: N/A;  . A/V SHUNTOGRAM N/A 03/05/2018   Procedure: A/V SHUNTOGRAM - Right Arm;  Surgeon: Waynetta Sandy, MD;  Location: [place] [facility];  Service: Cardiovascular;  Laterality: N/A;  . AV FISTULA PLACEMENT Right 10/17/2015   Procedure: INSERTION OF ARTERIOVENOUS GORE-TEX GRAFT RIGHT UPPER ARM WITH ACUSEAL;  Surgeon: Conrad Encinal, MD;  Location: [facility] [place] OR;  Service: Vascular;  Laterality: Right;  . AV FISTULA PLACEMENT Left 11/18/2019   Procedure: INSERTION OF ARTERIOVENOUS (AV) GORE-TEX GRAFT left  ARM;  Surgeon: Serafina Mitchell, MD;  Location: [place];  Service: Vascular;  Laterality: Left;  . CATARACT EXTRACTION W/ INTRAOCULAR LENS IMPLANT Bilateral   . CESAREAN SECTION    . CHOLECYSTECTOMY N/A  05/25/2013   Procedure: LAPAROSCOPIC CHOLECYSTECTOMY;  Surgeon: Jamesetta So, MD;  Location: [facility];  Service: General;  Laterality: N/A;  . COLONOSCOPY WITH PROPOFOL N/A 10/02/2016   two 3 to 5 mm polyps in the descending colon, three 2 to 3 mm polyps in the rectum, random colon biopsies, rectal bleeding due to internal hemorrhoids, friability with no bleeding at the anus status post biopsy.  Surgical pathology found the polyps to be a mix of hyperplastic and  tubular adenoma, random colon biopsies to be benign colonic mucosa, and the anal biopsies to be anal skin tag.   Marland Kitchen ESOPHAGOGASTRODUODENOSCOPY (EGD) WITH PROPOFOL N/A 10/02/2016   mucosal nodule in the esophagus status post biopsy, moderate gastritis status post biopsy, mild duodenitis. esophageal biopsy to be benign, gastric biopsies to be gastritis due to aspirin use, and duodenal biopsies to be duodenitis due to aspirin use  . EYE SURGERY Bilateral   . IR FLUORO GUIDE CV LINE RIGHT  10/09/2019  . IR REMOVAL TUN CV CATH W/O FL  01/15/2020  . IR THROMBECTOMY AV FISTULA W/THROMBOLYSIS/PTA INC/SHUNT/IMG RIGHT Right 12/26/2018  . IR THROMBECTOMY AV FISTULA W/THROMBOLYSIS/PTA INC/SHUNT/IMG RIGHT Right 08/10/2019  . IR THROMBECTOMY AV FISTULA W/THROMBOLYSIS/PTA/STENT INC/SHUNT/IMG RT Right 08/08/2018  . IR THROMBECTOMY AV FISTULA W/THROMBOLYSIS/PTA/STENT INC/SHUNT/IMG RT Right 05/15/2019  . IR US GUIDE VASC ACCESS RIGHT  08/08/2018  . IR US GUIDE VASC ACCESS RIGHT  12/26/2018  . IR US GUIDE VASC ACCESS RIGHT  05/15/2019  . IR US GUIDE VASC ACCESS RIGHT  08/10/2019  . IR US GUIDE VASC ACCESS RIGHT  10/09/2019  . PARS PLANA VITRECTOMY Left 11/24/2014   Procedure: PARS PLANA VITRECTOMY WITH 25 GAUGE;  Surgeon: Hurman Horn, MD;  Location: [place];  Service: Ophthalmology;  Laterality: Left;  . PERIPHERAL VASCULAR BALLOON ANGIOPLASTY Right 03/05/2018   Procedure: PERIPHERAL VASCULAR BALLOON ANGIOPLASTY;  Surgeon: Waynetta Sandy, MD;  Location: [place] [facility];  Service: Cardiovascular;  Laterality: Right;  arm fistula  . PERIPHERAL VASCULAR CATHETERIZATION N/A 04/28/2015   Procedure: Bilateral Upper Extremity Venography;  Surgeon: Conrad Cochise, MD;  Location: [place] [facility];  Service: Cardiovascular;  Laterality: N/A;  . PHOTOCOAGULATION WITH LASER Left 11/24/2014   Procedure: PHOTOCOAGULATION WITH LASER;  Surgeon: Hurman Horn, MD;  Location: [place];  Service: Ophthalmology;  Laterality: Left;  with  insertion of silicone oil  . SAVORY DILATION N/A 10/02/2016   Procedure: SAVORY DILATION;  Surgeon: Danie Binder, MD;  [facility];  Service: Endoscopy;  Laterality: N/A;  . TUBAL LIGATION    . UPPER EXTREMITY VENOGRAPHY Bilateral 11/02/2019   Procedure: UPPER EXTREMITY VENOGRAPHY;  Surgeon: Waynetta Sandy, MD;  Location: [place] [facility];  Service: Cardiovascular;  Laterality: Bilateral;    Prior to Admission medications   Medication Sig Start Date End Date Taking? Authorizing Provider  acetaminophen (TYLENOL) 500 MG tablet Take 1,000 mg by mouth every 6 (six) hours as needed for moderate pain or headache.   Yes [provider]  ARIPiprazole (ABILIFY) 5 MG tablet Take 1 tablet (5 mg total) by mouth daily. 12/14/20 03/14/21 Yes Hisada, Elie Goody, MD  azelastine (ASTELIN) 0.1 % nasal spray Place 1 spray into both nostrils 2 (two) times daily. Use in each nostril as directed Patient taking differently: Place 1 spray into both nostrils 2 (two) times daily as needed for rhinitis or allergies. Use in each nostril as directed 08/29/18  Yes Dettinger, Fransisca Kaufmann, MD  carvedilol (COREG) 12.5  MG tablet Take 1 tablet (12.5 mg total) by mouth 2 (two) times daily. 10/07/20  Yes Emokpae, Courage, MD  cholecalciferol (VITAMIN D) 1000 units tablet Take 1,000 Units by mouth daily.    Yes [provider]  COMBIGAN 0.2-0.5 % ophthalmic solution INSTILL ONE DROP IN [facility] EYE TWICE DAILY Patient taking differently: Place 1 drop into both eyes at bedtime. 05/17/20  Yes Rankin, Clent Demark, MD  Continuous Blood Gluc Sensor (FREESTYLE LIBRE 14 DAY SENSOR) MISC Inject 1 each into the skin every 14 (fourteen) days. Use as directed. 11/27/17  Yes Nida, Marella Chimes, MD  cyclobenzaprine (FLEXERIL) 5 MG tablet TAKE ONE TABLET BY MOUTH THREE TIMES DAILY AS NEEDED FOR MUSCLE SPASM Patient taking differently: Take 5 mg by mouth 3 (three) times daily as needed for muscle spasms. 12/18/19  Yes Dettinger,  Fransisca Kaufmann, MD  fluticasone (FLONASE) 50 MCG/ACT nasal spray Place 2 sprays into both nostrils daily as needed for allergies or rhinitis. 10/14/20  Yes [provider]  gabapentin (NEURONTIN) 100 MG capsule Take 1 capsule (100 mg total) by mouth 3 (three) times daily. (Needs to be seen before next refill) 11/25/19  Yes Dettinger, Fransisca Kaufmann, MD  glucose blood (PRODIGY NO CODING BLOOD GLUC) test strip USE TO CHECK BLOOD SUGAR TWICE DAILY. Dx E11.22 02/03/19  Yes Dettinger, Fransisca Kaufmann, MD  hydrALAZINE (APRESOLINE) 50 MG tablet Take 1 tablet (50 mg total) by mouth 2 (two) times daily. 10/07/20  Yes Roxan Hockey, MD  Insulin Pen Needle (TRUEPLUS PEN NEEDLES) 31G X 5 MM MISC Use daily with insulin Dx E11.22 03/09/19  Yes Dettinger, Fransisca Kaufmann, MD  LANTUS SOLOSTAR 100 UNIT/ML Solostar Pen INJECT 18-50 UNITS SUBCUTANEOUSLY AT BEDTIME. Patient taking differently: Inject 20 Units into the skin at bedtime. 10/01/19  Yes Dettinger, Fransisca Kaufmann, MD  linagliptin (TRADJENTA) 5 MG TABS tablet Take 1 tablet (5 mg total) by mouth daily. Patient taking differently: Take 5 mg by mouth every evening. 03/18/19  Yes Dettinger, Fransisca Kaufmann, MD  multivitamin (RENA-VIT) TABS tablet Take 1 tablet by mouth daily. 04/03/19  Yes [provider]  PRODIGY TWIST TOP LANCETS 28G MISC USE TO CHECK BLOOD SUGAR UP TO FOUR TIMES DAILY. 03/13/17  Yes Timmothy Euler, MD  sertraline (ZOLOFT) 100 MG tablet Take 1.5 tablets (150 mg total) by mouth daily. Patient taking differently: Take 150 mg by mouth at bedtime. 12/04/20  Yes [name], MD  sevelamer carbonate (RENVELA) 800 MG tablet Take 800-2,400 mg by mouth See admin instructions. Take 2459m by mouth three times daily with means and 8074mwith snacks   Yes [provider]  SYMBICORT 160-4.5 MCG/ACT inhaler Inhale 2 puffs into the lungs 2 (two) times daily. 10/07/20  Yes EmRoxan HockeyMD  traZODone (DESYREL) 100 MG tablet Take 100 mg by mouth at bedtime as needed for  sleep. 12/27/20  Yes [provider]  VENTOLIN HFA 108 (90 Base) MCG/ACT inhaler Inhale 2 puffs into the lungs every 4 (four) hours as needed for wheezing or shortness of breath. 10/07/20  Yes Emokpae, Courage, MD  dexamethasone (DECADRON) 4 MG tablet Take 1 tablet (4 mg total) by mouth daily with breakfast. Patient not taking: Reported on 12/31/2020 10/07/20   EmRoxan HockeyMD  dexamethasone (DECADRON) 4 MG tablet Take 1 tablet (4 mg total) by mouth daily. 01/01/21   TaOrson EvaMD  doxycycline (VIBRA-TABS) 100 MG tablet Take 1 tablet (100 mg total) by mouth 2 (two) times daily. Patient not taking: Reported on  12/31/2020 10/07/20   Roxan Hockey, MD  pantoprazole (PROTONIX) 40 MG tablet TAKE 1 BY MOUTH DAILY 30 minutes before breakfast Patient not taking: Reported on 12/31/2020 02/02/19   Dettinger, Fransisca Kaufmann, MD    Current Facility-Administered Medications  Medication Dose Route Frequency Provider Last Rate Last Admin  . acetaminophen (TYLENOL) tablet 650 mg  650 mg Oral Q6H PRN Emokpae, Ejiroghene E, MD       Or  . acetaminophen (TYLENOL) suppository 650 mg  650 mg Rectal Q6H PRN Emokpae, Ejiroghene E, MD      . ARIPiprazole (ABILIFY) tablet 5 mg  5 mg Oral Daily Emokpae, Ejiroghene E, MD      . budesonide (PULMICORT) nebulizer solution 0.5 mg  0.5 mg Nebulization BID Tat, David, MD   0.5 mg at 01/01/21 0816  . carvedilol (COREG) tablet 12.5 mg  12.5 mg Oral BID Emokpae, Ejiroghene E, MD   12.5 mg at 01/01/21 0823  . dexamethasone (DECADRON) tablet 4 mg  4 mg Oral Daily Tat, David, MD   4 mg at 01/01/21 0831  . hydrALAZINE (APRESOLINE) tablet 50 mg  50 mg Oral BID Emokpae, Ejiroghene E, MD   50 mg at 01/01/21 0823  . insulin aspart (novoLOG) injection 0-5 Units  0-5 Units Subcutaneous QHS Tat, David, MD      . insulin aspart (novoLOG) injection 0-9 Units  0-9 Units Subcutaneous TID WC Orson Eva, MD   2 Units at 01/01/21 1124  . ipratropium-albuterol (DUONEB) 0.5-2.5 (3) MG/3ML nebulizer  solution 3 mL  3 mL Nebulization Q4H PRN Emokpae, Ejiroghene E, MD      . ipratropium-albuterol (DUONEB) 0.5-2.5 (3) MG/3ML nebulizer solution 3 mL  3 mL Nebulization Q6H Orson Eva, MD   3 mL at 01/01/21 0816  . iron sucrose (VENOFER) 400 mg in sodium chloride 0.9 % 250 mL IVPB  400 mg Intravenous Once Tat, David, MD      . ondansetron (ZOFRAN) tablet 4 mg  4 mg Oral Q6H PRN Emokpae, Ejiroghene E, MD       Or  . ondansetron (ZOFRAN) injection 4 mg  4 mg Intravenous Q6H PRN Emokpae, Ejiroghene E, MD      . pantoprazole (PROTONIX) injection 40 mg  40 mg Intravenous Q24H Emokpae, Ejiroghene E, MD   40 mg at 12/31/20 2155  . polyethylene glycol (MIRALAX / GLYCOLAX) packet 17 g  17 g Oral Daily PRN Emokpae, Ejiroghene E, MD      . polyethylene glycol powder (GLYCOLAX/MIRALAX) container 255 g  1 Container Oral Once Hurshel Keys K, DO      . sertraline (ZOLOFT) tablet 150 mg  150 mg Oral Daily Emokpae, Ejiroghene E, MD   150 mg at 12/31/20 2157  . sevelamer carbonate (RENVELA) tablet 2,400 mg  2,400 mg Oral TID with meals Emokpae, Ejiroghene E, MD   2,400 mg at 01/01/21 0830  . sevelamer carbonate (RENVELA) tablet 800 mg  800 mg Oral With snacks Emokpae, Ejiroghene E, MD        Allergies as of 12/31/2020 - Review Complete 12/31/2020  Allergen Reaction Noted  . Sulfamethoxazole-trimethoprim Nausea And Vomiting 07/13/2015  . Bactrim [sulfamethoxazole-trimethoprim] Nausea And Vomiting 05/21/2013  . Prednisone Other (See Comments) 07/09/2013    Family History  Problem Relation Age of Onset  . COPD Mother   . Cancer Father   . Lymphoma Father   . Diabetes Sister   . Deep vein thrombosis Sister   . Diabetes Brother   . Hyperlipidemia Brother   .  Hypertension Brother   . Mental retardation Sister   . Alcohol abuse Paternal Grandmother   . Colon cancer Neg Hx   . Liver disease Neg Hx     Social History   Socioeconomic History  . Marital status: Single    Spouse name: Not on file  .  Number of children: 5  . Years of education: GED  . Highest education level: Not on file  Occupational History  . Not on file  Tobacco Use  . Smoking status: Current Every Day Smoker    Packs/day: 1.00    Years: 23.00    Pack years: 23.00    Types: Cigarettes    Start date: 12/03/2000  . Smokeless tobacco: Never Used  . Tobacco comment: one pack daily  Vaping Use  . Vaping Use: Never used  Substance and Sexual Activity  . Alcohol use: No    Alcohol/week: 0.0 standard drinks  . Drug use: No    Comment: Sober for 8 years  . Sexual activity: Not Currently    Birth control/protection: Surgical  Other Topics Concern  . Not on file  Social History Narrative  . Not on file   Social Determinants of Health   Financial Resource Strain: Not on file  Food Insecurity: Not on file  Transportation Needs: Not on file  Physical Activity: Not on file  Stress: Not on file  Social Connections: Not on file  Intimate Partner Violence: Not on file    Review of Systems: General: Negative for anorexia, weight loss, fever, chills, does note fatigue, weakness. Eyes: Blind  ENT: Negative for hoarseness, difficulty swallowing , nasal congestion. CV: Negative for chest pain, angina, palpitations, dyspnea on exertion, peripheral edema.  Respiratory: Negative for dyspnea at rest, dyspnea on exertion, cough, sputum, wheezing.  GI: See history of present illness. GU:  Negative for dysuria, hematuria, urinary incontinence, urinary frequency, nocturnal urination.  MS: Negative for joint pain, low back pain.  Derm: Negative for rash or itching.  Neuro: Negative for weakness, abnormal sensation, seizure, frequent headaches, memory loss, confusion.  Psych: Negative for anxiety, depression Endo: Negative for unusual weight change.  Heme: Negative for bruising or bleeding. Allergy: Negative for rash or hives.  Physical Exam: Vital signs in last 24 hours: Temp:  [97.9 F (36.6 C)-98.7 F (37.1 C)]  98.6 F (37 C) (06/05 0819) Pulse Rate:  [63-72] 65 (06/05 0819) Resp:  [15-25] 17 (06/05 0819) BP: (116-189)/(64-101) 152/79 (06/05 0819) SpO2:  [92 %-97 %] 95 % (06/05 0819) Weight:  [86.5 kg] 86.5 kg (06/04 1347) Last BM Date: 12/31/20 General:   Alert,  Well-developed, well-nourished, pleasant and cooperative in NAD Head:  Normocephalic and atraumatic. Eyes:  Sclera clear, no icterus.   Conjunctiva pink. Ears:  Normal auditory acuity. Nose:  No deformity, discharge,  or lesions. Mouth:  No deformity or lesions, dentition normal. Neck:  Supple; no masses or thyromegaly. Lungs:  Clear throughout to auscultation.   No wheezes, crackles, or rhonchi. No acute distress. Heart:  Regular rate and rhythm; no murmurs, clicks, rubs,  or gallops. Abdomen:  Soft, nontender and nondistended. No masses, hepatosplenomegaly or hernias noted. Normal bowel sounds, without guarding, and without rebound.   Msk:  Symmetrical without gross deformities. Normal posture. Pulses:  Normal pulses noted. Extremities:  Without clubbing or edema. Neurologic:  Alert and  oriented x4;  grossly normal neurologically. Skin:  Intact without significant lesions or rashes. Cervical Nodes:  No significant cervical adenopathy. Psych:  Alert and cooperative. Normal  mood and affect.  Intake/Output from previous day: 06/04 0701 - 06/05 0700 In: 630 [Blood:630] Out: -  Intake/Output this shift: Total I/O In: 480 [P.O.:480] Out: -   Lab Results: Recent Labs    12/31/20 1343 12/31/20 2101 01/01/21 0427  WBC 6.5  --  7.0  HGB 6.6* 8.2* 7.7*  HCT 23.0* 27.8* 26.3*  PLT 211  --  206   BMET Recent Labs    12/31/20 1343 01/01/21 0427  NA 135 135  K 4.0 4.2  CL 98 99  CO2 28 26  GLUCOSE 145* 159*  BUN 32* 36*  CREATININE 7.47* 8.03*  CALCIUM 9.0 8.9   LFT Recent Labs    12/31/20 1343  PROT 6.8  ALBUMIN 3.5  AST 15  ALT 14  ALKPHOS 82  BILITOT 0.6   PT/INR No results for input(s): LABPROT, INR  in the last 72 hours. Hepatitis Panel No results for input(s): HEPBSAG, HCVAB, HEPAIGM, HEPBIGM in the last 72 hours. C-Diff No results for input(s): CDIFFTOX in the last 72 hours.  Studies/Results: DG Chest Portable 1 View  Result Date: 12/31/2020 CLINICAL DATA:  Shortness of breath and dizziness EXAM: PORTABLE CHEST 1 VIEW COMPARISON:  Portable exam 1512 hours compared to 10/05/2020 FINDINGS: Enlargement of cardiac silhouette. Mediastinal contours and pulmonary vascularity normal. Atherosclerotic calcification aorta. Lungs clear. No pulmonary infiltrate, pleural effusion, or pneumothorax. Bones demineralized. Vascular stents in RIGHT upper extremity. IMPRESSION: Enlargement of cardiac silhouette. No acute abnormalities. Aortic Atherosclerosis (ICD10-I70.0). Electronically Signed   By: Lavonia Dana M.D.   On: 12/31/2020 15:20    Impression: *Iron deficiency anemia-worsening *Cirrhosis *GERD  Plan: Etiology of patient's iron deficiency anemia is likely multifactorial with ESRD, cannot rule out occult GI blood loss.  Discussed in depth with patient today.  We will plan on EGD and colonoscopy tomorrow to further evaluate her anemia. The risks including infection, bleed, or perforation as well as benefits, limitations, alternatives and imponderables have been reviewed with the patient. Questions have been answered. All parties agreeable.  Continue to monitor H&H and transfuse for less than 7.  Iron infusion today.  Continue on twice daily Protonix.  Thank you for letting us take part in this patient's care, further recommendations to follow.   Elon Alas. Abbey Chatters, D.O. Gastroenterology and Hepatology [facility]    LOS: 0 days     01/01/2021, 11:28 AM

## 2021-01-02 ENCOUNTER — Encounter (HOSPITAL_COMMUNITY): Payer: Self-pay | Admitting: Internal Medicine

## 2021-01-02 ENCOUNTER — Observation Stay (HOSPITAL_COMMUNITY): Payer: Medicare Other | Admitting: Certified Registered Nurse Anesthetist

## 2021-01-02 ENCOUNTER — Telehealth: Payer: Self-pay | Admitting: Gastroenterology

## 2021-01-02 ENCOUNTER — Encounter (HOSPITAL_COMMUNITY)
Admission: EM | Disposition: A | Discharge status: Home / Self Care | Payer: Self-pay | Source: Home / Self Care | Attending: Emergency Medicine

## 2021-01-02 DIAGNOSIS — I10 Essential (primary) hypertension: Secondary | ICD-10-CM | POA: Diagnosis not present

## 2021-01-02 DIAGNOSIS — E1122 Type 2 diabetes mellitus with diabetic chronic kidney disease: Secondary | ICD-10-CM | POA: Diagnosis not present

## 2021-01-02 DIAGNOSIS — D649 Anemia, unspecified: Secondary | ICD-10-CM | POA: Diagnosis not present

## 2021-01-02 DIAGNOSIS — I5032 Chronic diastolic (congestive) heart failure: Secondary | ICD-10-CM | POA: Diagnosis not present

## 2021-01-02 DIAGNOSIS — D509 Iron deficiency anemia, unspecified: Secondary | ICD-10-CM | POA: Diagnosis not present

## 2021-01-02 DIAGNOSIS — K2970 Gastritis, unspecified, without bleeding: Secondary | ICD-10-CM

## 2021-01-02 DIAGNOSIS — J45909 Unspecified asthma, uncomplicated: Secondary | ICD-10-CM | POA: Diagnosis not present

## 2021-01-02 DIAGNOSIS — B3781 Candidal esophagitis: Secondary | ICD-10-CM | POA: Diagnosis not present

## 2021-01-02 DIAGNOSIS — Z20822 Contact with and (suspected) exposure to covid-19: Secondary | ICD-10-CM | POA: Diagnosis not present

## 2021-01-02 DIAGNOSIS — N186 End stage renal disease: Secondary | ICD-10-CM | POA: Diagnosis not present

## 2021-01-02 HISTORY — PX: BIOPSY: SHX5522

## 2021-01-02 HISTORY — PX: ESOPHAGOGASTRODUODENOSCOPY (EGD) WITH PROPOFOL: SHX5813

## 2021-01-02 LAB — GLUCOSE, CAPILLARY
Glucose-Capillary: 150 mg/dL — ABNORMAL HIGH (ref 70–99)
Glucose-Capillary: 130 mg/dL — ABNORMAL HIGH (ref 70–99)
Glucose-Capillary: 137 mg/dL — ABNORMAL HIGH (ref 70–99)
Glucose-Capillary: 141 mg/dL — ABNORMAL HIGH (ref 70–99)
Glucose-Capillary: 140 mg/dL — ABNORMAL HIGH (ref 70–99)
Glucose-Capillary: 143 mg/dL — ABNORMAL HIGH (ref 70–99)

## 2021-01-02 LAB — BASIC METABOLIC PANEL
Anion gap: 11 (ref 5–15)
BUN: 44 mg/dL — ABNORMAL HIGH (ref 6–20)
CO2: 24 mmol/L (ref 22–32)
Calcium: 9.1 mg/dL (ref 8.9–10.3)
Chloride: 97 mmol/L — ABNORMAL LOW (ref 98–111)
Creatinine, Ser: 9.04 mg/dL — ABNORMAL HIGH (ref 0.44–1.00)
GFR, Estimated: 5 mL/min — ABNORMAL LOW (ref >60)
Glucose, Bld: 137 mg/dL — ABNORMAL HIGH (ref 70–99)
Potassium: 4.6 mmol/L (ref 3.5–5.1)
Sodium: 132 mmol/L — ABNORMAL LOW (ref 135–145)

## 2021-01-02 LAB — PROTIME-INR
INR: 1.3 — ABNORMAL HIGH (ref 0.8–1.2)
Prothrombin Time: 15.8 seconds — ABNORMAL HIGH (ref 11.4–15.2)

## 2021-01-02 LAB — CBC
HCT: 27 % — ABNORMAL LOW (ref 36.0–46.0)
Hemoglobin: 8 g/dL — ABNORMAL LOW (ref 12.0–15.0)
MCH: 24.8 pg — ABNORMAL LOW (ref 26.0–34.0)
MCHC: 29.6 g/dL — ABNORMAL LOW (ref 30.0–36.0)
MCV: 83.6 fL (ref 80.0–100.0)
Platelets: 198 10*3/uL (ref 150–400)
RBC: 3.23 MIL/uL — ABNORMAL LOW (ref 3.87–5.11)
RDW: 18.8 % — ABNORMAL HIGH (ref 11.5–15.5)
WBC: 7.9 10*3/uL (ref 4.0–10.5)
nRBC: 0 % (ref 0.0–0.2)

## 2021-01-02 LAB — KOH PREP

## 2021-01-02 LAB — HEMOGLOBIN A1C
Hgb A1c MFr Bld: 6.3 % — ABNORMAL HIGH (ref 4.8–5.6)
Mean Plasma Glucose: 134 mg/dL

## 2021-01-02 SURGERY — ESOPHAGOGASTRODUODENOSCOPY (EGD) WITH PROPOFOL
Anesthesia: General

## 2021-01-02 MED ORDER — LIDOCAINE HCL (CARDIAC) PF 100 MG/5ML IV SOSY
PREFILLED_SYRINGE | INTRAVENOUS | Status: DC | PRN
  Administered 2021-01-02: 40 mg via INTRAVENOUS

## 2021-01-02 MED ORDER — FLUCONAZOLE 100 MG PO TABS: 100.0000 mg | ORAL_TABLET | Freq: Every day | ORAL | 0 refills | Status: DC

## 2021-01-02 MED ORDER — LIDOCAINE VISCOUS HCL 2 % MT SOLN
OROMUCOSAL | Status: AC
  Filled 2021-01-02: qty 15

## 2021-01-02 MED ORDER — CHLORHEXIDINE GLUCONATE CLOTH 2 % EX PADS
6.0000 | MEDICATED_PAD | Freq: Every day | CUTANEOUS | Status: DC
  Administered 2021-01-02: 6 via TOPICAL

## 2021-01-02 MED ORDER — SODIUM CHLORIDE 0.9 % IV SOLN: INTRAVENOUS | Status: DC

## 2021-01-02 MED ORDER — IPRATROPIUM-ALBUTEROL 0.5-2.5 (3) MG/3ML IN SOLN
3.0000 mL | Freq: Once | RESPIRATORY_TRACT | Status: AC
  Administered 2021-01-02: 3 mL via RESPIRATORY_TRACT

## 2021-01-02 MED ORDER — PROPOFOL 10 MG/ML IV BOLUS
INTRAVENOUS | Status: DC | PRN
  Administered 2021-01-02: 50 mg via INTRAVENOUS
  Administered 2021-01-02: 80 mg via INTRAVENOUS

## 2021-01-02 MED ORDER — SODIUM CHLORIDE (PF) 0.9 % IJ SOLN
INTRAMUSCULAR | Status: AC
  Filled 2021-01-02: qty 10

## 2021-01-02 MED ORDER — STERILE WATER FOR IRRIGATION IR SOLN
Status: DC | PRN
  Administered 2021-01-02: 1.5 mL

## 2021-01-02 NOTE — TOC Transition Note (Signed)
Transition of Care [facility]) - CM/SW Discharge Note   Patient Details  Name: Kara Knapp MRN: [id number] Date of Birth: Jun 04, 1971  [facility] CM/SW Contact:  Natasha Bence, LCSW Phone Number: 01/02/2021, 4:41 PM   Clinical Narrative:    CSW notified of patient's readiness for discharge and need for transportation. CSW contacted [facility] transportation for d/c. [facility] transportation agreeable to take patient. TOC signing off.         Patient Goals and CMS Choice        Discharge Placement                       Discharge Plan and Services                                     Social Determinants of Health (SDOH) Interventions     Readmission Risk Interventions Readmission Risk Prevention Plan 10/07/2020  Transportation Screening Complete  PCP or Specialist Appt within 3-5 Days Complete  HRI or [place] Complete  Social Work Consult for [place] Planning/Counseling Complete  Palliative Care Screening Not Applicable  Medication Review Press photographer) Complete  Some recent data might be hidden

## 2021-01-02 NOTE — Transfer of Care (Signed)
Immediate Anesthesia Transfer of Care Note  Patient: Kara Knapp  Procedure(s) Performed: ESOPHAGOGASTRODUODENOSCOPY (EGD) WITH PROPOFOL (N/A ) BIOPSY  Patient Location: PACU  Anesthesia Type:MAC  Level of Consciousness: awake  Airway & Oxygen Therapy: Patient Spontanous Breathing and Patient connected to nasal cannula oxygen  Post-op Assessment: Report given to RN and Post -op Vital signs reviewed and stable  Post vital signs: Reviewed and stable  Last Vitals:  Vitals Value Taken Time  BP 133/73 01/02/2021 1506  Temp    Pulse 59 01/02/2021 1506  Resp 24  01/02/2021 1506  SpO2 98  01/02/2021 1506    Last Pain:  Vitals:   01/02/21 1453  TempSrc:   PainSc: 0-No pain      Patients Stated Pain Goal: 5 (23/36/12 2449)  Complications: No complications documented.

## 2021-01-02 NOTE — Op Note (Signed)
[facility] Patient Name: Kara Knapp Procedure Date: 01/02/2021 2:46 PM MRN: [id number] Date of Birth: 13-Mar-1971 Attending MD: Elon Alas. Abbey Chatters DO CSN: [id number] Age: 50 Admit Type: Outpatient Procedure:                Upper GI endoscopy Indications:              Iron deficiency anemia Providers:                Elon Alas. Abbey Chatters, DO, Charlsie Quest. Theda Sers RN, RN,                            Aram Candela Referring MD:              Medicines:                See the Anesthesia note for documentation of the                            administered medications Complications:            No immediate complications. Estimated Blood Loss:     Estimated blood loss was minimal. Procedure:                Pre-Anesthesia Assessment:                           - The anesthesia plan was to use monitored                            anesthesia care (MAC).                           After obtaining informed consent, the endoscope was                            passed under direct vision. Throughout the                            procedure, the patient's blood pressure, pulse, and                            oxygen saturations were monitored continuously. The                            [id number]) was introduced through the mouth,                            and advanced to the second part of duodenum. The                            upper GI endoscopy was accomplished without                            difficulty. The patient tolerated the procedure                            well. Scope In: 2:55:51 PM Scope  Out: 3:00:57 PM Total Procedure Duration: 0 hours 5 minutes 6 seconds  Findings:      Non-severe esophagitis with no bleeding was found in the upper third of       the esophagus. Cells for cytology were obtained by brushing.      Patchy moderate inflammation characterized by erosions and erythema was       found in the entire examined stomach. Biopsies were taken with a cold       forceps  for Helicobacter pylori testing.      The duodenal bulb, first portion of the duodenum and second portion of       the duodenum were normal. Impression:               - Non-severe candidiasis esophagitis with no                            bleeding. Cells for cytology obtained.                           - Gastritis. Biopsied.                           - Normal duodenal bulb, first portion of the                            duodenum and second portion of the duodenum. Moderate Sedation:      Per Anesthesia Care Recommendation:           - Return patient to hospital ward for ongoing care.                           - Resume previous diet.                           - Use a proton pump inhibitor PO daily.                           - Treat for candida if cytology positive                           - Needs outpatient colonoscopy. Okay to DC from GI                            standpoint Procedure Code(s):        --- Professional ---                           [phone], Esophagogastroduodenoscopy, flexible,                            transoral; with biopsy, single or multiple Diagnosis Code(s):        --- Professional ---                           B37.81, Candidal esophagitis                           K29.70, Gastritis,  unspecified, without bleeding                           D50.9, Iron deficiency anemia, unspecified CPT copyright 2019 American Medical Association. All rights reserved. The codes documented in this report are preliminary and upon coder review may  be revised to meet current compliance requirements. Elon Alas. Abbey Chatters, DO [place] Abbey Chatters, DO 01/02/2021 3:07:46 PM This report has been signed electronically. Number of Addenda: 0

## 2021-01-02 NOTE — Anesthesia Postprocedure Evaluation (Signed)
Anesthesia Post Note  Patient: Marca A Okubo  Procedure(s) Performed: ESOPHAGOGASTRODUODENOSCOPY (EGD) WITH PROPOFOL (N/A ) BIOPSY  Patient location during evaluation: PACU Anesthesia Type: General Level of consciousness: awake and alert and oriented Pain management: pain level controlled Vital Signs Assessment: post-procedure vital signs reviewed and stable Respiratory status: spontaneous breathing and respiratory function stable Cardiovascular status: blood pressure returned to baseline and stable Postop Assessment: no apparent nausea or vomiting Anesthetic complications: no   No complications documented.   Last Vitals:  Vitals:   01/02/21 1507 01/02/21 1515  BP: 133/73 (!) 173/65  Pulse: 61 60  Resp: (!) 22 (!) 22  Temp: 36.5 C   SpO2: 99% 99%    Last Pain:  Vitals:   01/02/21 1507  TempSrc:   PainSc: Asleep                 Congetta Odriscoll C Lelan Cush

## 2021-01-02 NOTE — Anesthesia Preprocedure Evaluation (Addendum)
Anesthesia Evaluation  Patient identified by MRN, date of birth, ID band Patient awake    Reviewed: Allergy & Precautions, NPO status , Patient's Chart, lab work & pertinent test results  History of Anesthesia Complications Negative for: history of anesthetic complications  Airway Mallampati: II  TM Distance: >3 FB Neck ROM: Full    Dental  (+) Edentulous Upper, Edentulous Lower   Pulmonary shortness of breath, asthma , COPD,  oxygen dependent, Current Smoker and Patient abstained from smoking.,     + wheezing      Cardiovascular Exercise Tolerance: Poor hypertension, Pt. on medications +CHF  Normal cardiovascular exam+ Valvular Problems/Murmurs (moderate TR and moderate PAH in Echo, )  Rhythm:Regular Rate:Normal  Left ventricle: The cavity size was normal. Wall thickness was at  the upper limits of normal. Systolic function was normal. The  estimated ejection fraction was in the range of 55% to 60%. Wall  motion was normal; there were no regional wall motion  abnormalities. Features are consistent with a pseudonormal left  ventricular filling pattern, with concomitant abnormal relaxation  and increased filling pressure (grade 2 diastolic dysfunction).  - Aortic valve: Mildly calcified annulus. Trileaflet; mildly  calcified leaflets. There was trivial regurgitation.  - Mitral valve: There was mild regurgitation.  - Left atrium: The atrium was moderately dilated.  - Right ventricle: The cavity size was mildly dilated. Systolic  function was low normal.  - Right atrium: Central venous pressure (est): 15 mm Hg.  - Atrial septum: No defect or patent foramen ovale was identified.  - Tricuspid valve: There was moderate regurgitation.  - Pulmonic valve: There was mild regurgitation.  - Pulmonary arteries: Systolic pressure was moderately increased.  PA peak pressure: 54 mm Hg (S).  - Pericardium, extracardiac:  There was no pericardial effusion   Neuro/Psych PSYCHIATRIC DISORDERS Anxiety Depression    GI/Hepatic GERD  Medicated,(+) Cirrhosis       ,   Endo/Other  diabetes, Well Controlled, Type 2, Insulin Dependent  Renal/GU Dialysis and ESRFRenal disease (last dialysis - 06/)     Musculoskeletal negative musculoskeletal ROS (+)   Abdominal   Peds  Hematology negative hematology ROS (+) anemia ,   Anesthesia Other Findings   Reproductive/Obstetrics negative OB ROS                           Anesthesia Physical Anesthesia Plan  ASA: IV  Anesthesia Plan: General   Post-op Pain Management:    Induction: Intravenous  PONV Risk Score and Plan: Propofol infusion  Airway Management Planned: Nasal Cannula and Natural Airway  Additional Equipment:   Intra-op Plan:   Post-operative Plan:   Informed Consent: I have reviewed the patients History and Physical, chart, labs and discussed the procedure including the risks, benefits and alternatives for the proposed anesthesia with the patient or authorized representative who has indicated his/her understanding and acceptance.       Plan Discussed with: CRNA and Surgeon  Anesthesia Plan Comments: (given duoneb treatment, wheezing improved.)     Anesthesia Quick Evaluation

## 2021-01-02 NOTE — Progress Notes (Addendum)
PROGRESS NOTE  Alianys A Florentino ZOX:[id number] DOB: [birth year] DOA: 12/31/2020 PCP: [facility], [facility]  Brief History:  50 year old female with history of ESRD, diabetes mellitus type 2, hypertension, tobacco abuse, COPD presenting with 2 to 3-day history of shortness of breath, dizziness, and dyspnea on exertion.  She stated that she has been followed at the cancer center at [facility] for her anemia.  She had a blood draw on 12/28/2020 with a hemoglobin of 7.0.  She tentatively had an appointment for PRBC transfusion, but she stated that it was not until 01/06/2021.  However, she began developing some dizziness, shortness of breath, and generalized weakness beginning 12/28/2020.  Her symptoms progressively worsened.  As result, she presented for further evaluation.  She denied any fevers, chills, chest pain, coughing, hemoptysis, nausea, vomiting or diarrhea, abdominal pain, headache, neck pain.  She is legally blind; therefore, she is not able to tell whether she is actually having any melena or hematochezia or hematemesis.  Nevertheless, she states that she has not had any emesis.  She had a colonoscopy on 10/02/2016 which revealed polyps in the descending colon and rectum.  Otherwise the colon was normal.  EGD on 10/02/2016 showed moderate gastritis. In the emergency department, the patient was afebrile hemodynamically stable with oxygen saturation 96% on room air.  Hemoglobin was noted to be 6.6.  The patient was transfused 1 unit PRBC.  Her Hgb remained stable after transfusion.  She was given venofer x 1.  D/C delayed until 01/02/21 due to timing of endoscopy  Assessment/Plan: Symptomatic anemia/iron deficiency anemia -FOBT negative -1 unit PRBC transfused -Iron saturation 5%, ferritin 45 -W11 914 -Folic acid 78.2 -Transfuse Venofer 400 mg x 1 -personally reviewed EKG--sinus, nonspecific ST change -01/01/21--discussed with GI, Dr. Bjorn Pippin for endoscopy 01/02/21 -01/02/21  EGD--non-bleeding esophagitis-->positive candida-->start fluconazole  ESRD -Patient normally dialyzes Tuesday, Thursday, Saturday -Last dialysis on 12/29/2020 -Continue Renvela -Case was discussed with nephrology, Dr. Graylon Gunning-- -patient stated that she wanted to go home and dialyze on her usual schedule -as the patient is euvolemic, saturating 96% on room air without any shortness of breath (after transfusion) with potassium 4.2 and bicarbonate 26 on 01/01/2021--patient can go back for her usual dialysis on her normal schedule with next dialysis on 01/03/2021--this was discussed with Dr. Graylon Gunning  COPD Exacerbation -mild -She has mild wheezing on exam but stable on room air without any dyspnea -She received bronchodilators and Pulmicort during the hospitalization -d/c home with dexamethasone 4 mg daily x 5 days (intolerance to prednisone)  Essential hypertension -Continue carvedilol and hydralazine  Depression/anxiety -Continue Zoloft and Abilify  Diabetes mellitus type 2 -Nonetheless on a scale -01/01/21 Hemoglobin A1c--6.3  -Resume Tradjenta at discharge  Chronic diastolic CHF -Clinically euvolemic  Tobacco abuse -Tobacco cessation discussed  Remote cocaine use -Patient quit using 13 years ago     Status is: Observation    Dispo: The patient is from: Home              Anticipated d/c is to: Home              Patient currently is medically stable to d/c.   Difficult to place patient No   Plan discharge 01/02/21        Family Communication: no  Family at bedside  Consultants:  GI  Code Status:  FULL   DVT Prophylaxis:  SCDs   Procedures: As Listed in Progress Note Above  Antibiotics:  None       Subjective: Patient denies fevers, chills, headache, chest pain, dyspnea, nausea, vomiting, diarrhea, abdominal pain, dysuria, hematuria,   Objective: Vitals:   01/02/21 0223 01/02/21 0318 01/02/21 0743 01/02/21 0839  BP:  (!) 135/57  (!) 159/81   Pulse:  60  61  Resp:  20    Temp:  98.1 F (36.7 C)    TempSrc:      SpO2: 96% 98% 99%   Weight:        Intake/Output Summary (Last 24 hours) at 01/02/2021 1046 Last data filed at 01/02/2021 0900 Gross per 24 hour  Intake 1083.5 ml  Output --  Net 1083.5 ml   Weight change:  Exam:   General:  Pt is alert, follows commands appropriately, not in acute distress  HEENT: No icterus, No thrush, No neck mass, [place]/AT  Cardiovascular: RRR, S1/S2, no rubs, no gallops  Respiratory: bibasilar rales. No wheeze  Abdomen: Soft/+BS, non tender, non distended, no guarding  Extremities: No edema, No lymphangitis, No petechiae, No rashes, no synovitis   Data Reviewed: I have personally reviewed following labs and imaging studies Basic Metabolic Panel: Recent Labs  Lab 12/28/20 1354 12/31/20 1343 01/01/21 0427 01/02/21 0611  NA 133* 135 135 132*  K 4.1 4.0 4.2 4.6  CL 97* 98 99 97*  CO2 27 28 26 24   GLUCOSE 93 145* 159* 137*  BUN 41* 32* 36* 44*  CREATININE 7.82* 7.47* 8.03* 9.04*  CALCIUM 8.9 9.0 8.9 9.1   Liver Function Tests: Recent Labs  Lab 12/28/20 1354 12/31/20 1343  AST 17 15  ALT 16 14  ALKPHOS 82 82  BILITOT 0.6 0.6  PROT 7.3 6.8  ALBUMIN 3.7 3.5   No results for input(s): LIPASE, AMYLASE in the last 168 hours. No results for input(s): AMMONIA in the last 168 hours. Coagulation Profile: Recent Labs  Lab 01/02/21 0611  INR 1.3*   CBC: Recent Labs  Lab 12/28/20 1354 12/31/20 1343 12/31/20 2101 01/01/21 0427 01/02/21 0636  WBC 6.4 6.5  --  7.0 7.9  NEUTROABS 4.9  --   --   --   --   HGB 7.0* 6.6* 8.2* 7.7* 8.0*  HCT 24.8* 23.0* 27.8* 26.3* 27.0*  MCV 82.9 83.0  --  83.8 83.6  PLT 224 211  --  206 198   Cardiac Enzymes: No results for input(s): CKTOTAL, CKMB, CKMBINDEX, TROPONINI in the last 168 hours. BNP: Invalid input(s): POCBNP CBG: Recent Labs  Lab 01/01/21 1622 01/01/21 2029 01/01/21 2353 01/02/21 0518 01/02/21 0716  GLUCAP 145*  197* 182* 150* 130*   HbA1C: No results for input(s): HGBA1C in the last 72 hours. Urine analysis:    Component Value Date/Time   COLORURINE YELLOW 09/07/2017 1105   APPEARANCEUR CLEAR 09/07/2017 1105   APPEARANCEUR Clear 08/02/2017 1456   LABSPEC 1.013 09/07/2017 1105   PHURINE 6.0 09/07/2017 1105   GLUCOSEU >=500 (A) 09/07/2017 1105   HGBUR NEGATIVE 09/07/2017 1105   BILIRUBINUR NEGATIVE 09/07/2017 1105   BILIRUBINUR Negative 08/02/2017 1456   KETONESUR NEGATIVE 09/07/2017 1105   PROTEINUR 100 (A) 09/07/2017 1105   UROBILINOGEN negative 09/06/2015 1133   UROBILINOGEN 0.2 01/21/2015 1448   NITRITE NEGATIVE 09/07/2017 1105   LEUKOCYTESUR NEGATIVE 09/07/2017 1105   LEUKOCYTESUR Trace (A) 08/02/2017 1456   Sepsis Labs: @LABRCNTIP (procalcitonin:4,lacticidven:4) ) Recent Results (from the past 240 hour(s))  Resp Panel by RT-PCR (Flu A&B, Covid) Nasopharyngeal Swab     Status: None   Collection Time: 12/31/20  2:52 PM   Specimen: Nasopharyngeal Swab; Nasopharyngeal(NP) swabs in vial transport medium  Result Value Ref Range Status   SARS Coronavirus 2 by RT PCR NEGATIVE NEGATIVE Final    Comment: (NOTE) SARS-CoV-2 target nucleic acids are NOT DETECTED.  The SARS-CoV-2 RNA is generally detectable in upper respiratory specimens during the acute phase of infection. The lowest concentration of SARS-CoV-2 viral copies this assay can detect is 138 copies/mL. A negative result does not preclude SARS-Cov-2 infection and should not be used as the sole basis for treatment or other patient management decisions. A negative result may occur with  improper specimen collection/handling, submission of specimen other than nasopharyngeal swab, presence of viral mutation(s) within the areas targeted by this assay, and inadequate number of viral copies(<138 copies/mL). A negative result must be combined with clinical observations, patient history, and epidemiological information. The expected  result is Negative.  Fact Sheet for Patients:  EntrepreneurPulse.com.au  Fact Sheet for Healthcare Providers:  IncredibleEmployment.be  This test is no t yet approved or cleared by the [place] FDA and  has been authorized for detection and/or diagnosis of SARS-CoV-2 by FDA under an Emergency Use Authorization (EUA). This EUA will remain  in effect (meaning this test can be used) for the duration of the COVID-19 declaration under Section 564(b)(1) of the Act, 21 U.S.C.section 360bbb-3(b)(1), unless the authorization is terminated  or revoked sooner.       Influenza A by PCR NEGATIVE NEGATIVE Final   Influenza B by PCR NEGATIVE NEGATIVE Final    Comment: (NOTE) The Xpert Xpress SARS-CoV-2/FLU/RSV plus assay is intended as an aid in the diagnosis of influenza from Nasopharyngeal swab specimens and should not be used as a sole basis for treatment. Nasal washings and aspirates are unacceptable for Xpert Xpress SARS-CoV-2/FLU/RSV testing.  Fact Sheet for Patients: EntrepreneurPulse.com.au  Fact Sheet for Healthcare Providers: IncredibleEmployment.be  This test is not yet approved or cleared by the [place] FDA and has been authorized for detection and/or diagnosis of SARS-CoV-2 by FDA under an Emergency Use Authorization (EUA). This EUA will remain in effect (meaning this test can be used) for the duration of the COVID-19 declaration under Section 564(b)(1) of the Act, 21 U.S.C. section 360bbb-3(b)(1), unless the authorization is terminated or revoked.  Performed at [facility], [address] [place], [place] [address]      Scheduled Meds: . ARIPiprazole  5 mg Oral Daily  . budesonide (PULMICORT) nebulizer solution  0.5 mg Nebulization BID  . carvedilol  12.5 mg Oral BID  . Chlorhexidine Gluconate Cloth  6 each Topical Daily  . dexamethasone  4 mg Oral Daily  . hydrALAZINE  50 mg Oral BID   . insulin aspart  0-5 Units Subcutaneous QHS  . insulin aspart  0-9 Units Subcutaneous TID WC  . pantoprazole (PROTONIX) IV  40 mg Intravenous Q24H  . sertraline  150 mg Oral Daily  . sevelamer carbonate  2,400 mg Oral TID with meals  . sevelamer carbonate  800 mg Oral With snacks   Continuous Infusions:  Procedures/Studies: DG Chest Portable 1 View  Result Date: 12/31/2020 CLINICAL DATA:  Shortness of breath and dizziness EXAM: PORTABLE CHEST 1 VIEW COMPARISON:  Portable exam 1512 hours compared to 10/05/2020 FINDINGS: Enlargement of cardiac silhouette. Mediastinal contours and pulmonary vascularity normal. Atherosclerotic calcification aorta. Lungs clear. No pulmonary infiltrate, pleural effusion, or pneumothorax. Bones demineralized. Vascular stents in RIGHT upper extremity. IMPRESSION: Enlargement of cardiac silhouette. No acute abnormalities. Aortic Atherosclerosis (ICD10-I70.0). Electronically Signed  By: Lavonia Dana M.D.   On: 12/31/2020 15:20   Intravitreal Injection, Pharmacologic Agent - OD - Right Eye  Result Date: 12/07/2020 Time Out 12/07/2020. 10:39 AM. Confirmed correct patient, procedure, site, and patient consented. Anesthesia Topical anesthesia was used. Anesthetic medications included Akten 3.5%, Proparacaine 0.5%. Procedure Preparation included Tobramycin 0.3%, 10% betadine to eyelids. A supplied needle was used. Injection: 2.5 mg Bevacizumab (AVASTIN) 2.60m/0.1mL SOSY   NDC:: 24462-863-81 Lot:: [id number]  Route: Intravitreal, Site: Right Eye Post-op Post injection exam found visual acuity of at least counting fingers. The patient tolerated the procedure well. There were no complications. The patient received written and verbal post procedure care education. Post injection medications were not given.   OCT, Retina - OU - Both Eyes  Result Date: 12/07/2020 Right Eye Quality was good. Scan locations included subfoveal. Central Foveal Thickness: 201. Progression has been stable.  Findings include abnormal foveal contour. Left Eye Quality was borderline. Scan locations included temporal. Progression has been stable. Findings include abnormal foveal contour. Notes History of CSME OD, stabilized and improved on recurrent intravitreal Avastin also to prevent recurrent microscopic vitreous hemorrhages in this monocular patient   DOrson Eva DO  [facility]  If 7PM-7AM, please contact night-coverage [email or web address] Password [email or web address] 01/02/2021, 10:46 AM   LOS: 0 days

## 2021-01-02 NOTE — Progress Notes (Signed)
Patient discharged home via cab. Discharge paperwork given to patient. Patient verbalized understanding. Belongings sent home with patient. Patient stable upon leaving.

## 2021-01-02 NOTE — Interval H&P Note (Signed)
History and Physical Interval Note:  01/02/2021 7:58 PM  Kara Knapp  has presented today for surgery, with the diagnosis of Anemia.  The various methods of treatment have been discussed with the patient and family. After consideration of risks, benefits and other options for treatment, the patient has consented to  Procedure(s): ESOPHAGOGASTRODUODENOSCOPY (EGD) WITH PROPOFOL (N/A) BIOPSY as a surgical intervention.  The patient's history has been reviewed, patient examined, no change in status, stable for surgery.  I have reviewed the patient's chart and labs.  Questions were answered to the patient's satisfaction.     Eloise Harman

## 2021-01-02 NOTE — Progress Notes (Signed)
Patient seen briefly today for colonoscopy/EGD. Per nursing staff and patient, she only drank 4 glasses of prep, stating she was clear after 4 glasses and felt she could stop. NPO except sips with meds.   Will plan on EGD only today. Can circle back to colonoscopy as outpatient. hgb 8.0 today, previously 6.6 on admission and received 1 unit PRBCs. Discussed risks and benefits with stated understanding.  Annitta Needs, PhD, ANP-BC [facility]

## 2021-01-02 NOTE — Telephone Encounter (Signed)
[facility] clinical pool:   Please arrange colonoscopy with Dr. Abbey Chatters due to [place]. ASA 3.   Keep upcoming appt with Magda Paganini.

## 2021-01-02 NOTE — Telephone Encounter (Signed)
Noted. We will call patient to schedule when receive future schedule

## 2021-01-04 ENCOUNTER — Ambulatory Visit (HOSPITAL_COMMUNITY): Payer: Medicare Other | Admitting: Clinical

## 2021-01-04 ENCOUNTER — Other Ambulatory Visit (HOSPITAL_COMMUNITY): Payer: Medicare Other

## 2021-01-04 LAB — BPAM RBC
Blood Product Expiration Date: 202207122359
Blood Product Expiration Date: 202207122359
ISSUE DATE / TIME: 202206041619
Unit Type and Rh: 5100
Unit Type and Rh: 5100

## 2021-01-04 LAB — TYPE AND SCREEN
ABO/RH(D): O POS
Antibody Screen: NEGATIVE
Unit division: 0
Unit division: 0

## 2021-01-04 LAB — SURGICAL PATHOLOGY

## 2021-01-04 MED ORDER — PEG 3350-KCL-NA BICARB-NACL 420 G PO SOLR: 4000.0000 mL | ORAL | 0 refills | Status: DC

## 2021-01-04 NOTE — Telephone Encounter (Signed)
Called and informed pt of Anna's recommendation. Pharmacy hasn't delivered Diflucan but she will call for delivery. TCS as scheduled.

## 2021-01-04 NOTE — Addendum Note (Signed)
Addended by: Hassan Rowan on: 01/04/2021 02:26 PM   Modules accepted: Orders

## 2021-01-04 NOTE — Telephone Encounter (Signed)
Please let her know she has yeast in her esophagus, which can cause problems swallowing. She needs to continue Diflucan, which was ordered at discharge for the complete course. Swallowing should get better. If not, call us in 1-2 weeks.  No need for repeat EGD as just had one. No oral medications for diabetes day of procedure. 1/2 dose insulin evening prior.

## 2021-01-04 NOTE — Telephone Encounter (Signed)
Called pt to schedule TCS, TCS scheduled for 02/13/21 at 11:30am (she has dialysis Tues, Thurs, Sat). She is requesting esophageal dilation at time of TCS. States her esophagus has been dilated yet and she's having trouble swallowing.   Vicente Males, please advise if EGD/DIL can be added to TCS. Also please adjust diabetic medications prior to procedure.

## 2021-01-04 NOTE — Telephone Encounter (Signed)
Pre-op phone call 02/08/21. Letter mailed with procedure instructions.

## 2021-01-06 ENCOUNTER — Encounter (HOSPITAL_COMMUNITY): Payer: Medicare Other

## 2021-01-09 ENCOUNTER — Encounter (HOSPITAL_COMMUNITY): Payer: Self-pay | Admitting: Internal Medicine

## 2021-01-10 ENCOUNTER — Ambulatory Visit (INDEPENDENT_AMBULATORY_CARE_PROVIDER_SITE_OTHER): Payer: Medicare Other | Admitting: Clinical

## 2021-01-10 ENCOUNTER — Other Ambulatory Visit: Payer: Self-pay

## 2021-01-10 DIAGNOSIS — F3341 Major depressive disorder, recurrent, in partial remission: Secondary | ICD-10-CM | POA: Diagnosis not present

## 2021-01-10 NOTE — Progress Notes (Signed)
Virtual Visit via Telephone Note   I connected with Kara Knapp on 01/10/21 at 3:00 PM EDT by telephone and verified that I am speaking with the correct person using two identifiers.   Location: Patient: Home Provider: Office    I discussed the limitations of evaluation and management by telemedicine and the availability of in person appointments. The patient expressed understanding and agreed to proceed.       THERAPIST PROGRESS NOTE   Session Time: 3:00 PM-3:40PM   Participation Level: Active   Behavioral Response: CasualAlertDepressed   Type of Therapy: Individual Therapy   Treatment Goals addressed: Coping   Interventions: CBT   Summary: Kara Knapp is a 50 y.o. female who presents with Depression. The OPT therapist worked with the patient for her ongoing OPT treatment session. The OPT therapist utilized Motivational Interviewing to assist in creating therapeutic repore. The patient in the session was engaged and work in collaboration giving feedback about her triggers and symptoms over the past few weeks including recent admission over the weekend to the hospital for abnormal blood work.The OPT therapist utilized Cognitive Behavioral Therapy through cognitive restructuring as well as worked with the patient on coping strategies to assist in management of mood and as she continues to work on her interactions with others. The patient reported the she continues to work on implementing positive thinking, self esteem, and staying active as well as being aware of and consistent in keeping her health care appointments. The patient noted she will be doing upcoming follow up appointments post her recent hospital admission last weekend as well as has an appointment in July with Dr. Modesta Messing for her medication therapy.   Suicidal/Homicidal: Nowithout intent/plan   Therapist Response: The OPT therapist worked with the patient for the patients scheduled session. The patient was engaged  in his session and gave feedback in relation to triggers, symptoms, and behavior responses over the past few weeks. The OPT therapist worked with the patient utilizing an in session Cognitive Behavioral Therapy exercise. The patient was responsive in the session and verbalized, "I tam feeling much better since coming home from the hospital this weekend". The OPT therapist worked with the patient on implementing positive thinking and reviewing upcoming care appointments. The OPT therapist gauged the patents mood through scale measuring within the session. The OPT therapist will continue treatment work with the patient in her next scheduled session   Plan: Return again in 3 weeks.   Diagnosis:      Axis I: MDD (major depressive disorder), recurrent, in partial remission                              Axis II: No diagnosis   I discussed the assessment and treatment plan with the patient. The patient was provided an opportunity to ask questions and all were answered. The patient agreed with the plan and demonstrated an understanding of the instructions.   The patient was advised to call back or seek an in-person evaluation if the symptoms worsen or if the condition fails to improve as anticipated.   I provided 30 minutes of non-face-to-face time during this encounter.   Lennox Grumbles, LCSW   12/12/2020

## 2021-01-13 ENCOUNTER — Inpatient Hospital Stay (HOSPITAL_BASED_OUTPATIENT_CLINIC_OR_DEPARTMENT_OTHER): Payer: Medicare Other | Admitting: Physician Assistant

## 2021-01-13 ENCOUNTER — Other Ambulatory Visit: Payer: Self-pay

## 2021-01-13 ENCOUNTER — Inpatient Hospital Stay (HOSPITAL_COMMUNITY): Payer: Medicare Other

## 2021-01-13 ENCOUNTER — Other Ambulatory Visit (HOSPITAL_COMMUNITY): Payer: Self-pay

## 2021-01-13 VITALS — BP 137/50 | HR 67 | Resp 16 | Wt 190.5 lb

## 2021-01-13 DIAGNOSIS — D500 Iron deficiency anemia secondary to blood loss (chronic): Secondary | ICD-10-CM | POA: Diagnosis not present

## 2021-01-13 DIAGNOSIS — D649 Anemia, unspecified: Secondary | ICD-10-CM | POA: Diagnosis not present

## 2021-01-13 DIAGNOSIS — E538 Deficiency of other specified B group vitamins: Secondary | ICD-10-CM

## 2021-01-13 LAB — CBC WITH DIFFERENTIAL/PLATELET
Abs Immature Granulocytes: 0.02 10*3/uL (ref 0.00–0.07)
Basophils Absolute: 0 10*3/uL (ref 0.0–0.1)
Basophils Relative: 1 %
Eosinophils Absolute: 0.1 10*3/uL (ref 0.0–0.5)
Eosinophils Relative: 1 %
HCT: 31.4 % — ABNORMAL LOW (ref 36.0–46.0)
Hemoglobin: 9.4 g/dL — ABNORMAL LOW (ref 12.0–15.0)
Immature Granulocytes: 0 %
Lymphocytes Relative: 9 %
Lymphs Abs: 0.6 10*3/uL — ABNORMAL LOW (ref 0.7–4.0)
MCH: 27.2 pg (ref 26.0–34.0)
MCHC: 29.9 g/dL — ABNORMAL LOW (ref 30.0–36.0)
MCV: 90.8 fL (ref 80.0–100.0)
Monocytes Absolute: 0.7 10*3/uL (ref 0.1–1.0)
Monocytes Relative: 11 %
Neutro Abs: 4.7 10*3/uL (ref 1.7–7.7)
Neutrophils Relative %: 78 %
Platelets: 171 10*3/uL (ref 150–400)
RBC: 3.46 MIL/uL — ABNORMAL LOW (ref 3.87–5.11)
RDW: 26.2 % — ABNORMAL HIGH (ref 11.5–15.5)
WBC: 6 10*3/uL (ref 4.0–10.5)
nRBC: 0 % (ref 0.0–0.2)

## 2021-01-13 LAB — SAMPLE TO BLOOD BANK

## 2021-01-13 MED ORDER — CYANOCOBALAMIN 1000 MCG/ML IJ SOLN
1000.0000 ug | Freq: Once | INTRAMUSCULAR | Status: AC
Start: 2021-01-13 — End: 2021-01-13
  Administered 2021-01-13: 1000 ug via INTRAMUSCULAR
  Filled 2021-01-13: qty 1

## 2021-01-13 NOTE — Progress Notes (Signed)
Kara Knapp, [place] [address]   CLINIC:  Medical Oncology/Hematology  PCP:  Medicine, [facility] [address] [place] [id number] [phone]   REASON FOR VISIT:  Follow-up for iron deficiency anemia with history of previously elevated ferritin (secondary hemochromatosis)  CURRENT THERAPY: Intermittent IV iron infusions, PRBC transfusion, continued observation  INTERVAL HISTORY:  [name] 50 y.o. female returns for routine follow-up of her iron deficiency anemia (with prior history of secondary hemochromatosis/secondary iron overload).  She was last seen by Tarri Abernethy PA-C on 12/28/2020.   At her last visit she was noted to have significant anemia with Hgb 7.4 and iron deficiency with ferritin 18 and iron saturation 4%.  Since that time, she had worsening dyspnea and weakness, went to the ED and found her Hgb to be 6.6.  She was admitted to the hospital from 12/31/2020 through 01/02/2021.  During that time she received PRBC transfusion x 1 on 12/31/2020.Marland Kitchen  She also underwent EGD on 01/02/2021 which showed gastritis (patchy moderate inflammation of the stomach characterized by erosions and erythema) as well as nonsevere candidal esophagitis without bleeding.  She has been scheduled for outpatient colonoscopy on 02/13/2021.    At her last appointment, we also recommended that she receive IV iron infusion via hemodialysis (Venofer 500 mg total in divided doses), which was communicated to her nephrologist.  She reports that she has been receiving IV iron at dialysis, but does not know how much.  She also received 400 mg of IV Venofer while she was hospitalized.  At today's visit, she reports that she has felt better after RBCs given in hospital.  Her energy has been improved - today she reports energy 60%  but was previously only 25% at her last visit.  She still has some dyspnea on exertion, but this is much improved.  She denies any chest pain,  palpitations, or syncopal episodes.  She remains uncertain if she has had any melena or hematochezia, since she is legally blind and unable to see her bowel movements.  She denies any emesis.  She has 60% energy and 100% appetite. She endorses that she is maintaining a stable weight.    REVIEW OF SYSTEMS:  Review of Systems  Constitutional:  Positive for fatigue (Energy 60%). Negative for appetite change, chills, diaphoresis, fever and unexpected weight change.  HENT:   Positive for trouble swallowing (Current candidal esophagitis). Negative for lump/mass and nosebleeds.   Eyes:  Negative for eye problems.  Respiratory:  Positive for shortness of breath (Mild dyspnea on exertion). Negative for cough and hemoptysis.   Cardiovascular:  Negative for chest pain, leg swelling and palpitations.  Gastrointestinal:  Negative for abdominal pain, blood in stool, constipation, diarrhea, nausea and vomiting.  Genitourinary:  Negative for hematuria.   Skin: Negative.   Neurological:  Negative for dizziness, headaches and light-headedness.  Hematological:  Does not bruise/bleed easily.  Psychiatric/Behavioral:  Positive for depression and sleep disturbance. Negative for suicidal ideas.      PAST MEDICAL/SURGICAL HISTORY:  Past Medical History:  Diagnosis Date   Anemia of chronic disease    Anxiety    Asthma    Blind left eye    Bronchitis    Cataract    Cholecystitis, acute 05/26/2013   Status post cholecystectomy   Chronic abdominal pain    Chronic diarrhea    COPD (chronic obstructive pulmonary disease) (HCC)    Depression    Diabetic foot ulcer ([place]) 03/01/2015  Diastolic heart failure (HCC)    ESRD on hemodialysis ([place])    Started diaylsis 12/29/15   Essential hypertension    Fibroids    Glaucoma    History of blood transfusion    History of pneumonia    Hyperlipidemia    Insulin-dependent diabetes mellitus with retinopathy    Liver fibrosis    Negative Hep B surface antigen,  negative Hep C antibody Feb 2018 (see scanned in labs).   Neuropathy    Osteomyelitis (HCC)    Toe on left foot   Past Surgical History:  Procedure Laterality Date   A/V SHUNTOGRAM N/A 10/25/2016   Procedure: A/V Shuntogram - Right Arm;  Surgeon: Waynetta Sandy, MD;  Location: [facility];  Service: Cardiovascular;  Laterality: N/A;   A/V SHUNTOGRAM N/A 03/05/2018   Procedure: A/V SHUNTOGRAM - Right Arm;  Surgeon: Waynetta Sandy, MD;  Location: [place] [facility];  Service: Cardiovascular;  Laterality: N/A;   AV FISTULA PLACEMENT Right 10/17/2015   Procedure: INSERTION OF ARTERIOVENOUS GORE-TEX GRAFT RIGHT UPPER ARM WITH ACUSEAL;  Surgeon: Conrad Byram, MD;  Location: [facility];  Service: Vascular;  Laterality: Right;   AV FISTULA PLACEMENT Left 11/18/2019   Procedure: INSERTION OF ARTERIOVENOUS (AV) GORE-TEX GRAFT left  ARM;  Surgeon: Serafina Mitchell, MD;  Location: [facility];  Service: Vascular;  Laterality: Left;   BIOPSY  01/02/2021   Procedure: BIOPSY;  Surgeon: Eloise Harman, DO;  [facility];  Service: Endoscopy;;   CATARACT EXTRACTION W/ INTRAOCULAR LENS IMPLANT Bilateral    CESAREAN SECTION     CHOLECYSTECTOMY N/A 05/25/2013   Procedure: LAPAROSCOPIC CHOLECYSTECTOMY;  Surgeon: Jamesetta So, MD;  Location: [facility];  Service: General;  Laterality: N/A;   COLONOSCOPY WITH PROPOFOL N/A 10/02/2016   two 3 to 5 mm polyps in the descending colon, three 2 to 3 mm polyps in the rectum, random colon biopsies, rectal bleeding due to internal hemorrhoids, friability with no bleeding at the anus status post biopsy.  Surgical pathology found the polyps to be a mix of hyperplastic and tubular adenoma, random colon biopsies to be benign colonic mucosa, and the anal biopsies to be anal skin tag.    ESOPHAGOGASTRODUODENOSCOPY (EGD) WITH PROPOFOL N/A 10/02/2016   mucosal nodule in the esophagus status post biopsy, moderate gastritis status post biopsy, mild duodenitis.  esophageal biopsy to be benign, gastric biopsies to be gastritis due to aspirin use, and duodenal biopsies to be duodenitis due to aspirin use   ESOPHAGOGASTRODUODENOSCOPY (EGD) WITH PROPOFOL N/A 01/02/2021   Procedure: ESOPHAGOGASTRODUODENOSCOPY (EGD) WITH PROPOFOL;  Surgeon: Eloise Harman, DO;  [facility];  Service: Endoscopy;  Laterality: N/A;   EYE SURGERY Bilateral    IR FLUORO GUIDE CV LINE RIGHT  10/09/2019   IR REMOVAL TUN CV CATH W/O FL  01/15/2020   IR THROMBECTOMY AV FISTULA W/THROMBOLYSIS/PTA INC/SHUNT/IMG RIGHT Right 12/26/2018   IR THROMBECTOMY AV FISTULA W/THROMBOLYSIS/PTA INC/SHUNT/IMG RIGHT Right 08/10/2019   IR THROMBECTOMY AV FISTULA W/THROMBOLYSIS/PTA/STENT INC/SHUNT/IMG RT Right 08/08/2018   IR THROMBECTOMY AV FISTULA W/THROMBOLYSIS/PTA/STENT INC/SHUNT/IMG RT Right 05/15/2019   IR US GUIDE VASC ACCESS RIGHT  08/08/2018   IR US GUIDE VASC ACCESS RIGHT  12/26/2018   IR US GUIDE VASC ACCESS RIGHT  05/15/2019   IR US GUIDE VASC ACCESS RIGHT  08/10/2019   IR US GUIDE VASC ACCESS RIGHT  10/09/2019   PARS PLANA VITRECTOMY Left 11/24/2014   Procedure: PARS PLANA VITRECTOMY WITH 25  GAUGE;  Surgeon: Hurman Horn, MD;  Location: [facility];  Service: Ophthalmology;  Laterality: Left;   PERIPHERAL VASCULAR BALLOON ANGIOPLASTY Right 03/05/2018   Procedure: PERIPHERAL VASCULAR BALLOON ANGIOPLASTY;  Surgeon: Waynetta Sandy, MD;  Location: [facility];  Service: Cardiovascular;  Laterality: Right;  arm fistula   PERIPHERAL VASCULAR CATHETERIZATION N/A 04/28/2015   Procedure: Bilateral Upper Extremity Venography;  Surgeon: Conrad Duck, MD;  Location: [facility];  Service: Cardiovascular;  Laterality: N/A;   PHOTOCOAGULATION WITH LASER Left 11/24/2014   Procedure: PHOTOCOAGULATION WITH LASER;  Surgeon: Hurman Horn, MD;  Location: [facility];  Service: Ophthalmology;  Laterality: Left;  with insertion of silicone oil   SAVORY DILATION N/A 10/02/2016   Procedure: SAVORY  DILATION;  Surgeon: Danie Binder, MD;  [facility];  Service: Endoscopy;  Laterality: N/A;   TUBAL LIGATION     UPPER EXTREMITY VENOGRAPHY Bilateral 11/02/2019   Procedure: UPPER EXTREMITY VENOGRAPHY;  Surgeon: Waynetta Sandy, MD;  Location: [facility];  Service: Cardiovascular;  Laterality: Bilateral;     SOCIAL HISTORY:  Social History   Socioeconomic History   Marital status: Single    Spouse name: Not on file   Number of children: 5   Years of education: GED   Highest education level: Not on file  Occupational History   Not on file  Tobacco Use   Smoking status: Every Day    Packs/day: 1.00    Years: 23.00    Pack years: 23.00    Types: Cigarettes    Start date: 12/03/2000   Smokeless tobacco: Never   Tobacco comments:    one pack daily  Vaping Use   Vaping Use: Never used  Substance and Sexual Activity   Alcohol use: No    Alcohol/week: 0.0 standard drinks   Drug use: No    Comment: Sober for 8 years   Sexual activity: Not Currently    Birth control/protection: Surgical  Other Topics Concern   Not on file  Social History Narrative   Not on file   Social Determinants of Health   Financial Resource Strain: Not on file  Food Insecurity: Not on file  Transportation Needs: Not on file  Physical Activity: Not on file  Stress: Not on file  Social Connections: Not on file  Intimate Partner Violence: Not on file    FAMILY HISTORY:  Family History  Problem Relation Age of Onset   COPD Mother    Cancer Father    Lymphoma Father    Diabetes Sister    Deep vein thrombosis Sister    Diabetes Brother    Hyperlipidemia Brother    Hypertension Brother    Mental retardation Sister    Alcohol abuse Paternal Grandmother    Colon cancer Neg Hx    Liver disease Neg Hx     CURRENT MEDICATIONS:  Outpatient Encounter Medications as of 01/13/2021  Medication Sig   acetaminophen (TYLENOL) 500 MG tablet Take 1,000 mg by mouth every 6 (six)  hours as needed for moderate pain or headache.   ARIPiprazole (ABILIFY) 5 MG tablet Take 1 tablet (5 mg total) by mouth daily.   azelastine (ASTELIN) 0.1 % nasal spray Place 1 spray into both nostrils 2 (two) times daily. Use in each nostril as directed (Patient taking differently: Place 1 spray into both nostrils 2 (two) times daily as needed for rhinitis or allergies. Use in each nostril as directed)   carvedilol (  COREG) 12.5 MG tablet Take 1 tablet (12.5 mg total) by mouth 2 (two) times daily.   cholecalciferol (VITAMIN D) 1000 units tablet Take 1,000 Units by mouth daily.    COMBIGAN 0.2-0.5 % ophthalmic solution INSTILL ONE DROP IN [facility] EYE TWICE DAILY (Patient taking differently: Place 1 drop into both eyes at bedtime.)   Continuous Blood Gluc Sensor (FREESTYLE LIBRE 14 DAY SENSOR) MISC Inject 1 each into the skin every 14 (fourteen) days. Use as directed.   cyclobenzaprine (FLEXERIL) 5 MG tablet TAKE ONE TABLET BY MOUTH THREE TIMES DAILY AS NEEDED FOR MUSCLE SPASM (Patient taking differently: Take 5 mg by mouth 3 (three) times daily as needed for muscle spasms.)   dexamethasone (DECADRON) 4 MG tablet Take 1 tablet (4 mg total) by mouth daily.   DOXYCYCLINE PO Take by mouth.   fluconazole (DIFLUCAN) 100 MG tablet Take 1 tablet (100 mg total) by mouth daily.   fluticasone (FLONASE) 50 MCG/ACT nasal spray Place 2 sprays into both nostrils daily as needed for allergies or rhinitis.   gabapentin (NEURONTIN) 100 MG capsule Take 1 capsule (100 mg total) by mouth 3 (three) times daily. (Needs to be seen before next refill)   glucose blood (PRODIGY NO CODING BLOOD GLUC) test strip USE TO CHECK BLOOD SUGAR TWICE DAILY. Dx E11.22   hydrALAZINE (APRESOLINE) 50 MG tablet Take 1 tablet (50 mg total) by mouth 2 (two) times daily.   Insulin Pen Needle (TRUEPLUS PEN NEEDLES) 31G X 5 MM MISC Use daily with insulin Dx E11.22   LANTUS SOLOSTAR 100 UNIT/ML Solostar Pen INJECT 18-50 UNITS SUBCUTANEOUSLY AT BEDTIME.  (Patient taking differently: Inject 20 Units into the skin at bedtime.)   linagliptin (TRADJENTA) 5 MG TABS tablet Take 1 tablet (5 mg total) by mouth daily. (Patient taking differently: Take 5 mg by mouth every evening.)   multivitamin (RENA-VIT) TABS tablet Take 1 tablet by mouth daily.   polyethylene glycol-electrolytes (TRILYTE) 420 g solution Take 4,000 mLs by mouth as directed.   PRODIGY TWIST TOP LANCETS 28G MISC USE TO CHECK BLOOD SUGAR UP TO FOUR TIMES DAILY.   sertraline (ZOLOFT) 100 MG tablet Take 1.5 tablets (150 mg total) by mouth daily. (Patient taking differently: Take 150 mg by mouth at bedtime.)   sevelamer carbonate (RENVELA) 800 MG tablet Take 800-2,400 mg by mouth See admin instructions. Take 2448m by mouth three times daily with means and 8054mwith snacks   SYMBICORT 160-4.5 MCG/ACT inhaler Inhale 2 puffs into the lungs 2 (two) times daily.   traZODone (DESYREL) 100 MG tablet Take 100 mg by mouth at bedtime as needed for sleep.   VENTOLIN HFA 108 (90 Base) MCG/ACT inhaler Inhale 2 puffs into the lungs every 4 (four) hours as needed for wheezing or shortness of breath.   No facility-administered encounter medications on file as of 01/13/2021.    ALLERGIES:  Allergies  Allergen Reactions   Sulfamethoxazole-Trimethoprim Nausea And Vomiting   Bactrim [Sulfamethoxazole-Trimethoprim] Nausea And Vomiting   Prednisone Other (See Comments)    "I was wide open and couldn't eat" per pt. Loss of appetite and insomnia  LOSS OF APPETITE,UNABLE TO SLEEP     PHYSICAL EXAM:  ECOG PERFORMANCE STATUS: 2 - Symptomatic, <50% confined to bed  Vitals:   01/13/21 1153  BP: (!) 137/50  Pulse: 67  Resp: 16  SpO2: 95%   Filed Weights   01/13/21 1153  Weight: 190 lb 7.6 oz (86.4 kg)   Physical Exam Constitutional:  Appearance: Normal appearance. She is obese.  HENT:     Head: Normocephalic and atraumatic.     Mouth/Throat:     Mouth: Mucous membranes are moist.  Eyes:      Extraocular Movements: Extraocular movements intact.     Pupils: Pupils are equal, round, and reactive to light.     Comments: Visual impairment  Cardiovascular:     Rate and Rhythm: Normal rate and regular rhythm.     Pulses: Normal pulses.     Heart sounds: Normal heart sounds.  Pulmonary:     Effort: Pulmonary effort is normal.     Breath sounds: Wheezing (Faint) present.  Abdominal:     General: Bowel sounds are normal.     Palpations: Abdomen is soft.     Tenderness: There is no abdominal tenderness.  Musculoskeletal:        General: No swelling.     Right lower leg: No edema.     Left lower leg: No edema.  Lymphadenopathy:     Cervical: No cervical adenopathy.  Skin:    General: Skin is warm and dry.     Comments: Hyperpigmentation  Neurological:     General: No focal deficit present.     Mental Status: She is alert and oriented to person, place, and time.  Psychiatric:        Mood and Affect: Mood normal.        Behavior: Behavior normal.     LABORATORY DATA:  I have reviewed the labs as listed.  CBC    Component Value Date/Time   WBC 7.9 01/02/2021 0636   RBC 3.23 (L) 01/02/2021 0636   HGB 8.0 (L) 01/02/2021 0636   HGB 12.1 05/14/2018 1357   HCT 27.0 (L) 01/02/2021 0636   HCT 35.9 05/14/2018 1357   PLT 198 01/02/2021 0636   PLT 171 05/14/2018 1357   MCV 83.6 01/02/2021 0636   MCV 99 (H) 05/14/2018 1357   MCH 24.8 (L) 01/02/2021 0636   MCHC 29.6 (L) 01/02/2021 0636   RDW 18.8 (H) 01/02/2021 0636   RDW 12.6 05/14/2018 1357   LYMPHSABS 0.8 12/28/2020 1354   LYMPHSABS 1.1 05/14/2018 1357   MONOABS 0.5 12/28/2020 1354   EOSABS 0.1 12/28/2020 1354   EOSABS 0.1 05/14/2018 1357   BASOSABS 0.1 12/28/2020 1354   BASOSABS 0.1 05/14/2018 1357   CMP Latest Ref Rng & Units 01/02/2021 01/01/2021 12/31/2020  Glucose 70 - 99 mg/dL 137(H) 159(H) 145(H)  BUN 6 - 20 mg/dL 44(H) 36(H) 32(H)  Creatinine 0.44 - 1.00 mg/dL 9.04(H) 8.03(H) 7.47(H)  Sodium 135 - 145 mmol/L  132(L) 135 135  Potassium 3.5 - 5.1 mmol/L 4.6 4.2 4.0  Chloride 98 - 111 mmol/L 97(L) 99 98  CO2 22 - 32 mmol/L 24 26 28   Calcium 8.9 - 10.3 mg/dL 9.1 8.9 9.0  Total Protein 6.5 - 8.1 g/dL - - 6.8  Total Bilirubin 0.3 - 1.2 mg/dL - - 0.6  Alkaline Phos 38 - 126 U/L - - 82  AST 15 - 41 U/L - - 15  ALT 0 - 44 U/L - - 14    DIAGNOSTIC IMAGING:  I have independently reviewed the relevant imaging and discussed with the patient.  ASSESSMENT: 1.  Elevated ferritin due to secondary hemochromatosis - She was previously tested for hemochromatosis mutation panel which was negative. -Liver biopsy on 02/07/2018 showed secondary iron overload.  She was receiving frequent iron infusions at dialysis at that time. - She has not  received any parenteral iron therapy in the past 2-3 years.  - She denies any family history of hemochromatosis. - Labs (12/21/2020) showed iron depletion with iron saturation 4% and ferritin 18 - She received 400 mg IV Venofer, 12/31/2020, and also received IV iron at dialysis this week (unknown dose)   2.  Iron deficiency anemia - Labs (12/21/2020) showed iron depletion with iron saturation 4% and ferritin 18 - ED visit with Hgb 6.6 (12/31/2020).  Admitted to the hospital from 12/31/2020 through 01/02/2021.  Received PRBC transfusion x 1 and 400 mg IV Venofer -- EGD on 01/02/2021 which showed gastritis (patchy moderate inflammation of the stomach characterized by erosions and erythema) as well as nonsevere candidal esophagitis without bleeding.   - High suspicion for GI blood loss, although patient is unable to see what her stool looks like due to severe visual impairment - Also has an element of anemia of ESRD, receiving 12,000 units of Epogen with dialysis treatments - Received blood transfusion several years ago due to bleeding from unspecified source, possibly from internal hemorrhoids - Workup for anemia showed mild B12 deficiency but was otherwise unremarkable - erythropoietin  elevated (receiving EPO at dialysis), reticulocytes somewhat low considering the degree of her anemia (in keeping with iron depleted state and hypoproliferation); SPEP and immunofixation were normal (elevated light chains in keeping with CKD, but without concern for monoclonal protein); normal LDH, copper, vitamin D, folate -- Repeat CBC today (01/13/2021) shows improved Hgb 9.4    PLAN:  1.  Elevated ferritin due to secondary hemochromatosis - Elevated ferritin 2019 due to secondary hemochromatosis in the setting of IV iron transfusions and underlying fatty liver disease/early cirrhosis - Most recent ferritin 18, no current concern for iron overload - She received 400 mg IV Venofer, 12/31/2020, and also received IV iron at dialysis this week (unknown dose) - Recommend to HOLD on any further IV iron until lab recheck - Repeat iron panel in 6 weeks  2.  Iron deficiency anemia - Labs today show improved Hgb 9.4 (s/p Venofer and PRBC x 1) -- Can continue to monitor her CBC with lab draws at dialysis - Recommend to continue ESA (12,000 units EPO being given during hemodialysis) - Continue GI follow-up to find source of blood loss - Recommend to HOLD on any further IV iron until lab recheck - Repeat iron panel in 6 weeks -- RTC in 6-8 weeks for repeat labs and follow-up  3. B12 deficiency -- Workup of anemia showed normal B12 907, but with elevated methylmalonic acid 490 -- B12 may be falsely elevated as an acute phase reactant, but elevated MMA suggests B12 deficiency -- Will give B12 injection to day and recheck B12 and MMA in 6-8 weeks (patient reports that she was told not to take oral B12 supplements by her nephrologist)    PLAN SUMMARY & DISPOSITION: - Labs in 6 weeks (CBC, iron panel, B12/MMA) - Follow-up visit in 6 to 8 weeks  All questions were answered. The patient knows to call the clinic with any problems, questions or concerns.  Medical decision making: Moderate   Time spent  on visit: I spent 25 minutes counseling the patient face to face. The total time spent in the appointment was 40 minutes and more than 50% was on counseling.   Harriett Rush, PA-C  01/13/21 2:45 PM

## 2021-01-13 NOTE — Patient Instructions (Signed)
[place] at [facility] Discharge Instructions  You were seen today by Tarri Abernethy PA-C for your anemia.    Your blood counts are improved after your recent blood transfusion and IV iron infusion.   We will re-check your labs in 6 weeks, but int he meantime, you should continue to have your CBC (blood counts) checked by dialysis.  You should NOT receive any additional IV iron at this time.  LABS: Return in 6 weeks for labs   OTHER TESTS: Continue to have your CBC checked at dialysis.  Continue to follow up with gastroenterology (GI) for possible blood loss.  MEDICATIONS: No changes to your home medications.  FOLLOW-UP APPOINTMENT: Return in 6 weeks   Thank you for choosing [place] at [facility] to provide your oncology and hematology care.  To afford each patient quality time with our provider, please arrive at least 15 minutes before your scheduled appointment time.   If you have a lab appointment with the [facility] please come in thru the Main Entrance and check in at the main information desk.  You need to re-schedule your appointment should you arrive 10 or more minutes late.  We strive to give you quality time with our providers, and arriving late affects you and other patients whose appointments are after yours.  Also, if you no show three or more times for appointments you may be dismissed from the clinic at the providers discretion.     Again, thank you for choosing [facility].  Our hope is that these requests will decrease the amount of time that you wait before being seen by our physicians.       _____________________________________________________________  Should you have questions after your visit to [facility], please contact our office at [phone] and follow the prompts.  Our office hours are 8:00 a.m. and 4:30 p.m. Monday - Friday.  Please note that voicemails left after 4:00 p.m.  may not be returned until the following business day.  We are closed weekends and major holidays.  You do have access to a nurse 24-7, just call the main number to the clinic [phone] and do not press any options, hold on the line and a nurse will answer the phone.    For prescription refill requests, have your pharmacy contact our office and allow 72 hours.    Due to Covid, you will need to wear a mask upon entering the hospital. If you do not have a mask, a mask will be given to you at the Main Entrance upon arrival. For doctor visits, patients may have 1 support person age 63 or older with them. For treatment visits, patients can not have anyone with them due to social distancing guidelines and our immunocompromised population.

## 2021-01-13 NOTE — Progress Notes (Signed)
Kara Knapp presents today for injection per the provider's orders. Vitamin B12 administration without incident; injection site WNL; see MAR for injection details.  Patient tolerated procedure well and without incident.  No questions or complaints noted at this time. Patient discharged by wheelchair in stable condition with care giver.

## 2021-01-27 DIAGNOSIS — Z299 Encounter for prophylactic measures, unspecified: Secondary | ICD-10-CM | POA: Diagnosis not present

## 2021-01-27 DIAGNOSIS — Z992 Dependence on renal dialysis: Secondary | ICD-10-CM | POA: Diagnosis not present

## 2021-01-27 DIAGNOSIS — I10 Essential (primary) hypertension: Secondary | ICD-10-CM | POA: Diagnosis not present

## 2021-01-27 DIAGNOSIS — N186 End stage renal disease: Secondary | ICD-10-CM | POA: Diagnosis not present

## 2021-01-27 DIAGNOSIS — K219 Gastro-esophageal reflux disease without esophagitis: Secondary | ICD-10-CM | POA: Diagnosis not present

## 2021-01-28 DIAGNOSIS — Z992 Dependence on renal dialysis: Secondary | ICD-10-CM | POA: Diagnosis not present

## 2021-01-28 DIAGNOSIS — N186 End stage renal disease: Secondary | ICD-10-CM | POA: Diagnosis not present

## 2021-01-28 DIAGNOSIS — Z7689 Persons encountering health services in other specified circumstances: Secondary | ICD-10-CM | POA: Diagnosis not present

## 2021-01-31 DIAGNOSIS — Z7689 Persons encountering health services in other specified circumstances: Secondary | ICD-10-CM | POA: Diagnosis not present

## 2021-01-31 DIAGNOSIS — N186 End stage renal disease: Secondary | ICD-10-CM | POA: Diagnosis not present

## 2021-01-31 DIAGNOSIS — Z992 Dependence on renal dialysis: Secondary | ICD-10-CM | POA: Diagnosis not present
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
## 2021-02-03 ENCOUNTER — Ambulatory Visit (INDEPENDENT_AMBULATORY_CARE_PROVIDER_SITE_OTHER): Payer: Medicare Other | Admitting: Clinical

## 2021-02-03 ENCOUNTER — Other Ambulatory Visit: Payer: Self-pay

## 2021-02-03 DIAGNOSIS — F3341 Major depressive disorder, recurrent, in partial remission: Secondary | ICD-10-CM

## 2021-02-03 NOTE — Progress Notes (Signed)
Virtual Visit via Telephone Note   I connected with Kara Knapp on 02/03/21 at 11:00 AM EDT by telephone and verified that I am speaking with the correct person using two identifiers.   Location: Patient: Home Provider: Office    I discussed the limitations of evaluation and management by telemedicine and the availability of in person appointments. The patient expressed understanding and agreed to proceed.       THERAPIST PROGRESS NOTE   Session Time: 11:00 AM-11:30AM   Participation Level: Active   Behavioral Response: CasualAlertDepressed   Type of Therapy: Individual Therapy   Treatment Goals addressed: Coping   Interventions: CBT   Summary: Kara Knapp is a 50 y.o. female who presents with Depression. The OPT therapist worked with the patient for her ongoing OPT treatment session. The OPT therapist utilized Motivational Interviewing to assist in creating therapeutic repore. The patient in the session was engaged and work in collaboration giving feedback about her triggers and symptoms over the past few weeks including fallout with a female friend and her concern over upcoming medical procedure colonoscopy.The OPT therapist utilized Cognitive Behavioral Therapy through cognitive restructuring as well as worked with the patient on coping strategies to assist in management of mood and as she continues to work on her interactions with others. The patient reported the she continues to work on implementing positive thinking, self esteem, and staying active as well as being aware of and consistent in keeping her health care appointments. The patient noted she will be doing upcoming PCP.    Suicidal/Homicidal: Nowithout intent/plan   Therapist Response: The OPT therapist worked with the patient for the patients scheduled session. The patient was engaged in his session and gave feedback in relation to triggers, symptoms, and behavior responses over the past few weeks. The OPT  therapist worked with the patient utilizing an in session Cognitive Behavioral Therapy exercise. The patient was responsive in the session and verbalized, "I might just be alone and not have a partner but I don't need the wrong kind of person in my life". The OPT therapist worked with the patient on implementing positive thinking and reviewing upcoming care appointments. The OPT therapist gauged the patents mood through scale measuring within the session. The patient reported recent positive health report in relation to her A1C and blood sugar numbers. The OPT therapist will continue treatment work with the patient in her next scheduled session   Plan: Return again in 3 weeks.   Diagnosis:      Axis I: MDD (major depressive disorder), recurrent, in partial remission                              Axis II: No diagnosis   I discussed the assessment and treatment plan with the patient. The patient was provided an opportunity to ask questions and all were answered. The patient agreed with the plan and demonstrated an understanding of the instructions.   The patient was advised to call back or seek an in-person evaluation if the symptoms worsen or if the condition fails to improve as anticipated.   I provided 30 minutes of non-face-to-face time during this encounter.   Lennox Grumbles, LCSW   02/03/2021

## 2021-02-04 DIAGNOSIS — Z7689 Persons encountering health services in other specified circumstances: Secondary | ICD-10-CM | POA: Diagnosis not present

## 2021-02-04 DIAGNOSIS — N186 End stage renal disease: Secondary | ICD-10-CM | POA: Diagnosis not present

## 2021-02-04 DIAGNOSIS — Z992 Dependence on renal dialysis: Secondary | ICD-10-CM | POA: Diagnosis not present

## 2021-02-06 ENCOUNTER — Telehealth (INDEPENDENT_AMBULATORY_CARE_PROVIDER_SITE_OTHER): Payer: Medicare Other | Admitting: Psychiatry

## 2021-02-06 ENCOUNTER — Other Ambulatory Visit: Payer: Self-pay

## 2021-02-06 ENCOUNTER — Encounter: Payer: Self-pay | Admitting: Psychiatry

## 2021-02-06 DIAGNOSIS — G4700 Insomnia, unspecified: Secondary | ICD-10-CM

## 2021-02-06 DIAGNOSIS — F3341 Major depressive disorder, recurrent, in partial remission: Secondary | ICD-10-CM | POA: Diagnosis not present

## 2021-02-06 MED ORDER — ZOLPIDEM TARTRATE 5 MG PO TABS: 5.0000 mg | ORAL_TABLET | Freq: Every evening | ORAL | 2 refills | Status: DC | PRN

## 2021-02-06 MED ORDER — ARIPIPRAZOLE 5 MG PO TABS: 5.0000 mg | ORAL_TABLET | Freq: Every day | ORAL | 0 refills | Status: DC

## 2021-02-06 NOTE — Patient Instructions (Signed)
Kara Knapp  02/06/2021     @PREFPERIOPPHARMACY @   Your procedure is scheduled on  02/13/2021.   Report to [facility] at  0930 A.M.   Call this number if you have problems the morning of surgery:  [phone]   Remember:  Follow the diet and prep instructions given to you by the office.  DO NOT take any medications for diabetes the morning of your procedure.    Take these medicines the morning of surgery with A SIP OF WATER       abilify, coreg, gabapentin.    Use your inhaler before you come and bring your rescue inhaler with you.     Do not wear jewelry, make-up or nail polish.  Do not wear lotions, powders, or perfumes, or deodorant.  Do not shave 48 hours prior to surgery.  Men may shave face and neck.  Do not bring valuables to the hospital.  [facility] is not responsible for any belongings or valuables.  Contacts, dentures or bridgework may not be worn into surgery.  Leave your suitcase in the car.  After surgery it may be brought to your room.  For patients admitted to the hospital, discharge time will be determined by your treatment team.  Patients discharged the day of surgery will not be allowed to drive home and must have someone with them for 24 hours.    Special instructions:  DO NOT smoke tobacco or vape for 24 hours before your procedure.  Please read over the following fact sheets that you were given. Anesthesia Post-op Instructions and Care and Recovery After Surgery      Colonoscopy, Adult, Care After This sheet gives you information about how to care for yourself after your procedure. Your health care provider may also give you more specific instructions. If you have problems or questions, contact your health careprovider. What can I expect after the procedure? After the procedure, it is common to have: A small amount of blood in your stool for 24 hours after the procedure. Some gas. Mild cramping or bloating of your  abdomen. Follow these instructions at home: Eating and drinking  Drink enough fluid to keep your urine pale yellow. Follow instructions from your health care provider about eating or drinking restrictions. Resume your normal diet as instructed by your health care provider. Avoid heavy or fried foods that are hard to digest.  Activity Rest as told by your health care provider. Avoid sitting for a long time without moving. Get up to take short walks every 1-2 hours. This is important to improve blood flow and breathing. Ask for help if you feel weak or unsteady. Return to your normal activities as told by your health care provider. Ask your health care provider what activities are safe for you. Managing cramping and bloating  Try walking around when you have cramps or feel bloated. Apply heat to your abdomen as told by your health care provider. Use the heat source that your health care provider recommends, such as a moist heat pack or a heating pad. Place a towel between your skin and the heat source. Leave the heat on for 20-30 minutes. Remove the heat if your skin turns bright red. This is especially important if you are unable to feel pain, heat, or cold. You may have a greater risk of getting burned.  General instructions If you were given a sedative during the procedure, it can affect you for several hours. Do  not drive or operate machinery until your health care provider says that it is safe. For the first 24 hours after the procedure: Do not sign important documents. Do not drink alcohol. Do your regular daily activities at a slower pace than normal. Eat soft foods that are easy to digest. Take over-the-counter and prescription medicines only as told by your health care provider. Keep all follow-up visits as told by your health care provider. This is important. Contact a health care provider if: You have blood in your stool 2-3 days after the procedure. Get help right away if you  have: More than a small spotting of blood in your stool. Large blood clots in your stool. Swelling of your abdomen. Nausea or vomiting. A fever. Increasing pain in your abdomen that is not relieved with medicine. Summary After the procedure, it is common to have a small amount of blood in your stool. You may also have mild cramping and bloating of your abdomen. If you were given a sedative during the procedure, it can affect you for several hours. Do not drive or operate machinery until your health care provider says that it is safe. Get help right away if you have a lot of blood in your stool, nausea or vomiting, a fever, or increased pain in your abdomen. This information is not intended to replace advice given to you by your health care provider. Make sure you discuss any questions you have with your healthcare provider. Document Revised: 07/10/2019 Document Reviewed: 02/09/2019 Elsevier Patient Education  [place] After This sheet gives you information about how to care for yourself after your procedure. Your health care provider may also give you more specific instructions. If you have problems or questions, contact your health careprovider. What can I expect after the procedure? After the procedure, it is common to have: Tiredness. Forgetfulness about what happened after the procedure. Impaired judgment for important decisions. Nausea or vomiting. Some difficulty with balance. Follow these instructions at home: For the time period you were told by your health care provider:     Rest as needed. Do not participate in activities where you could fall or become injured. Do not drive or use machinery. Do not drink alcohol. Do not take sleeping pills or medicines that cause drowsiness. Do not make important decisions or sign legal documents. Do not take care of children on your own. Eating and drinking Follow the diet that is recommended  by your health care provider. Drink enough fluid to keep your urine pale yellow. If you vomit: Drink water, juice, or soup when you can drink without vomiting. Make sure you have little or no nausea before eating solid foods. General instructions Have a responsible adult stay with you for the time you are told. It is important to have someone help care for you until you are awake and alert. Take over-the-counter and prescription medicines only as told by your health care provider. If you have sleep apnea, surgery and certain medicines can increase your risk for breathing problems. Follow instructions from your health care provider about wearing your sleep device: Anytime you are sleeping, including during daytime naps. While taking prescription pain medicines, sleeping medicines, or medicines that make you drowsy. Avoid smoking. Keep all follow-up visits as told by your health care provider. This is important. Contact a health care provider if: You keep feeling nauseous or you keep vomiting. You feel light-headed. You are still sleepy or having trouble with balance after  24 hours. You develop a rash. You have a fever. You have redness or swelling around the IV site. Get help right away if: You have trouble breathing. You have new-onset confusion at home. Summary For several hours after your procedure, you may feel tired. You may also be forgetful and have poor judgment. Have a responsible adult stay with you for the time you are told. It is important to have someone help care for you until you are awake and alert. Rest as told. Do not drive or operate machinery. Do not drink alcohol or take sleeping pills. Get help right away if you have trouble breathing, or if you suddenly become confused. This information is not intended to replace advice given to you by your health care provider. Make sure you discuss any questions you have with your healthcare provider. Document Revised: 03/31/2020  Document Reviewed: 06/18/2019 Elsevier Patient Education  2022 [employer].

## 2021-02-06 NOTE — Patient Instructions (Signed)
1. Continue sertraline 150 mg daily 2. Continue Abilify 5 mg at night  3. Next appointment- 10/10 at 1 PM

## 2021-02-07 DIAGNOSIS — Z7689 Persons encountering health services in other specified circumstances: Secondary | ICD-10-CM | POA: Diagnosis not present

## 2021-02-07 DIAGNOSIS — N186 End stage renal disease: Secondary | ICD-10-CM | POA: Diagnosis not present

## 2021-02-07 DIAGNOSIS — Z992 Dependence on renal dialysis: Secondary | ICD-10-CM | POA: Diagnosis not present

## 2021-02-08 ENCOUNTER — Encounter (HOSPITAL_COMMUNITY): Payer: Self-pay

## 2021-02-08 ENCOUNTER — Encounter (HOSPITAL_COMMUNITY)
Admission: RE | Admit: 2021-02-08 | Discharge: 2021-02-08 | Disposition: A | Discharge status: Home / Self Care | Payer: Medicare Other | Source: Ambulatory Visit | Attending: Internal Medicine | Admitting: Internal Medicine

## 2021-02-08 ENCOUNTER — Telehealth: Payer: Self-pay | Admitting: Internal Medicine

## 2021-02-08 ENCOUNTER — Other Ambulatory Visit: Payer: Self-pay

## 2021-02-08 HISTORY — DX: Gastro-esophageal reflux disease without esophagitis: K21.9

## 2021-02-08 NOTE — Telephone Encounter (Signed)
Spoke to pt, TCS rescheduled to 03/10/21 at 12:30pm. Endo scheduler informed.

## 2021-02-08 NOTE — Telephone Encounter (Signed)
Pt wants to reschedule procedure due to no transportation or help mixing her prep. [phone]

## 2021-02-09 DIAGNOSIS — Z7689 Persons encountering health services in other specified circumstances: Secondary | ICD-10-CM | POA: Diagnosis not present

## 2021-02-09 DIAGNOSIS — N186 End stage renal disease: Secondary | ICD-10-CM | POA: Diagnosis not present

## 2021-02-09 DIAGNOSIS — Z992 Dependence on renal dialysis: Secondary | ICD-10-CM | POA: Diagnosis not present

## 2021-02-09 NOTE — Telephone Encounter (Signed)
Pre-op phone call 03/07/21. Letter and new procedure instructions mailed.

## 2021-02-11 DIAGNOSIS — Z992 Dependence on renal dialysis: Secondary | ICD-10-CM | POA: Diagnosis not present

## 2021-02-11 DIAGNOSIS — Z7689 Persons encountering health services in other specified circumstances: Secondary | ICD-10-CM | POA: Diagnosis not present

## 2021-02-11 DIAGNOSIS — N186 End stage renal disease: Secondary | ICD-10-CM | POA: Diagnosis not present

## 2021-02-14 ENCOUNTER — Telehealth: Payer: Self-pay

## 2021-02-14 DIAGNOSIS — N186 End stage renal disease: Secondary | ICD-10-CM | POA: Diagnosis not present

## 2021-02-14 DIAGNOSIS — Z992 Dependence on renal dialysis: Secondary | ICD-10-CM | POA: Diagnosis not present

## 2021-02-14 DIAGNOSIS — Z7689 Persons encountering health services in other specified circumstances: Secondary | ICD-10-CM | POA: Diagnosis not present

## 2021-02-14 NOTE — Telephone Encounter (Signed)
[name] PA calls today to report that patient has a small dime size "bubble" over LUA graft site that was placed on 10/2019. It is getting bigger (increased to dime size over a week). Denies any thin shiny skin, or ulcerations. Graft is running well - patient has no complaints of numbness or tingling. Placed patient on the schedule next week for evaluation.

## 2021-02-16 DIAGNOSIS — N186 End stage renal disease: Secondary | ICD-10-CM | POA: Diagnosis not present

## 2021-02-16 DIAGNOSIS — Z992 Dependence on renal dialysis: Secondary | ICD-10-CM | POA: Diagnosis not present

## 2021-02-16 DIAGNOSIS — Z7689 Persons encountering health services in other specified circumstances: Secondary | ICD-10-CM | POA: Diagnosis not present

## 2021-02-17 DIAGNOSIS — Z7189 Other specified counseling: Secondary | ICD-10-CM | POA: Diagnosis not present

## 2021-02-17 DIAGNOSIS — Z789 Other specified health status: Secondary | ICD-10-CM | POA: Diagnosis not present

## 2021-02-17 DIAGNOSIS — Z0000 Encounter for general adult medical examination without abnormal findings: Secondary | ICD-10-CM | POA: Diagnosis not present

## 2021-02-17 DIAGNOSIS — Z299 Encounter for prophylactic measures, unspecified: Secondary | ICD-10-CM | POA: Diagnosis not present

## 2021-02-17 DIAGNOSIS — Z7689 Persons encountering health services in other specified circumstances: Secondary | ICD-10-CM | POA: Diagnosis not present

## 2021-02-17 DIAGNOSIS — I10 Essential (primary) hypertension: Secondary | ICD-10-CM | POA: Diagnosis not present

## 2021-02-17 DIAGNOSIS — N186 End stage renal disease: Secondary | ICD-10-CM | POA: Diagnosis not present

## 2021-02-18 DIAGNOSIS — N186 End stage renal disease: Secondary | ICD-10-CM | POA: Diagnosis not present

## 2021-02-18 DIAGNOSIS — Z7689 Persons encountering health services in other specified circumstances: Secondary | ICD-10-CM | POA: Diagnosis not present

## 2021-02-18 DIAGNOSIS — Z992 Dependence on renal dialysis: Secondary | ICD-10-CM | POA: Diagnosis not present

## 2021-02-20 ENCOUNTER — Other Ambulatory Visit: Payer: Self-pay

## 2021-02-20 ENCOUNTER — Ambulatory Visit (INDEPENDENT_AMBULATORY_CARE_PROVIDER_SITE_OTHER): Payer: Medicare Other | Admitting: Physician Assistant

## 2021-02-20 VITALS — BP 130/60 | HR 63 | Temp 98.2°F | Resp 20 | Ht 63.0 in | Wt 181.0 lb

## 2021-02-20 DIAGNOSIS — Z992 Dependence on renal dialysis: Secondary | ICD-10-CM | POA: Diagnosis not present

## 2021-02-20 DIAGNOSIS — N186 End stage renal disease: Secondary | ICD-10-CM

## 2021-02-20 DIAGNOSIS — Z7689 Persons encountering health services in other specified circumstances: Secondary | ICD-10-CM | POA: Diagnosis not present

## 2021-02-20 NOTE — H&P (View-Only) (Signed)
Office Note     CC:  follow up Requesting Provider:  Medicine, [facility]  HPI: Kara Knapp is a 50 y.o. (May 11, 1971) female who presents for evaluation of pseudoaneurysm of left arm AV graft.  Left upper arm AV graft was placed in April 2021.  She was referred back to our office by nephrology PA due to noticeable enlarging pseudoaneurysm of left arm.  She is still able to complete HD treatments from left arm AV graft.  She is dialyzing on a Tuesday Thursday Saturday schedule at [facility] in [place].  Prior access includes right arm AV graft   Past Medical History:  Diagnosis Date   Anemia of chronic disease    Anxiety    Asthma    Blind left eye    Bronchitis    Cataract    Cholecystitis, acute 05/26/2013   Status post cholecystectomy   Chronic abdominal pain    Chronic diarrhea    COPD (chronic obstructive pulmonary disease) (HCC)    Depression    Diabetic foot ulcer ([place]) 76/22/6333   Diastolic heart failure ([place])    ESRD on hemodialysis ([place])    Started diaylsis 12/29/15   Essential hypertension    Fibroids    GERD (gastroesophageal reflux disease)    Glaucoma    History of blood transfusion    History of pneumonia    Hyperlipidemia    Insulin-dependent diabetes mellitus with retinopathy    Liver fibrosis    Negative Hep B surface antigen, negative Hep C antibody Feb 2018 (see scanned in labs).   Neuropathy    Osteomyelitis (HCC)    Toe on left foot    Past Surgical History:  Procedure Laterality Date   A/V SHUNTOGRAM N/A 10/25/2016   Procedure: A/V Shuntogram - Right Arm;  Surgeon: Waynetta Sandy, MD;  Location: [place] [facility];  Service: Cardiovascular;  Laterality: N/A;   A/V SHUNTOGRAM N/A 03/05/2018   Procedure: A/V SHUNTOGRAM - Right Arm;  Surgeon: Waynetta Sandy, MD;  Location: [place] [facility];  Service: Cardiovascular;  Laterality: N/A;   AV FISTULA PLACEMENT Right 10/17/2015   Procedure: INSERTION OF ARTERIOVENOUS  GORE-TEX GRAFT RIGHT UPPER ARM WITH ACUSEAL;  Surgeon: Conrad Moline, MD;  Location: [place];  Service: Vascular;  Laterality: Right;   AV FISTULA PLACEMENT Left 11/18/2019   Procedure: INSERTION OF ARTERIOVENOUS (AV) GORE-TEX GRAFT left  ARM;  Surgeon: Serafina Mitchell, MD;  Location: [place];  Service: Vascular;  Laterality: Left;   BIOPSY  01/02/2021   Procedure: BIOPSY;  Surgeon: Eloise Harman, DO;  [facility];  Service: Endoscopy;;   CATARACT EXTRACTION W/ INTRAOCULAR LENS IMPLANT Bilateral    CESAREAN SECTION     CHOLECYSTECTOMY N/A 05/25/2013   Procedure: LAPAROSCOPIC CHOLECYSTECTOMY;  Surgeon: Jamesetta So, MD;  Location: [facility];  Service: General;  Laterality: N/A;   COLONOSCOPY WITH PROPOFOL N/A 10/02/2016   two 3 to 5 mm polyps in the descending colon, three 2 to 3 mm polyps in the rectum, random colon biopsies, rectal bleeding due to internal hemorrhoids, friability with no bleeding at the anus status post biopsy.  Surgical pathology found the polyps to be a mix of hyperplastic and tubular adenoma, random colon biopsies to be benign colonic mucosa, and the anal biopsies to be anal skin tag.    ESOPHAGOGASTRODUODENOSCOPY (EGD) WITH PROPOFOL N/A 10/02/2016   mucosal nodule in the esophagus status post biopsy, moderate gastritis status post biopsy, mild  duodenitis. esophageal biopsy to be benign, gastric biopsies to be gastritis due to aspirin use, and duodenal biopsies to be duodenitis due to aspirin use   ESOPHAGOGASTRODUODENOSCOPY (EGD) WITH PROPOFOL N/A 01/02/2021   Procedure: ESOPHAGOGASTRODUODENOSCOPY (EGD) WITH PROPOFOL;  Surgeon: Eloise Harman, DO;  [facility];  Service: Endoscopy;  Laterality: N/A;   EYE SURGERY Bilateral    IR FLUORO GUIDE CV LINE RIGHT  10/09/2019   IR REMOVAL TUN CV CATH W/O FL  01/15/2020   IR THROMBECTOMY AV FISTULA W/THROMBOLYSIS/PTA INC/SHUNT/IMG RIGHT Right 12/26/2018   IR THROMBECTOMY AV FISTULA W/THROMBOLYSIS/PTA INC/SHUNT/IMG RIGHT  Right 08/10/2019   IR THROMBECTOMY AV FISTULA W/THROMBOLYSIS/PTA/STENT INC/SHUNT/IMG RT Right 08/08/2018   IR THROMBECTOMY AV FISTULA W/THROMBOLYSIS/PTA/STENT INC/SHUNT/IMG RT Right 05/15/2019   IR US GUIDE VASC ACCESS RIGHT  08/08/2018   IR US GUIDE VASC ACCESS RIGHT  12/26/2018   IR US GUIDE VASC ACCESS RIGHT  05/15/2019   IR US GUIDE VASC ACCESS RIGHT  08/10/2019   IR US GUIDE VASC ACCESS RIGHT  10/09/2019   PARS PLANA VITRECTOMY Left 11/24/2014   Procedure: PARS PLANA VITRECTOMY WITH 25 GAUGE;  Surgeon: Hurman Horn, MD;  Location: [place];  Service: Ophthalmology;  Laterality: Left;   PERIPHERAL VASCULAR BALLOON ANGIOPLASTY Right 03/05/2018   Procedure: PERIPHERAL VASCULAR BALLOON ANGIOPLASTY;  Surgeon: Waynetta Sandy, MD;  Location: [place] [facility];  Service: Cardiovascular;  Laterality: Right;  arm fistula   PERIPHERAL VASCULAR CATHETERIZATION N/A 04/28/2015   Procedure: Bilateral Upper Extremity Venography;  Surgeon: Conrad Chestertown, MD;  Location: [place] [facility];  Service: Cardiovascular;  Laterality: N/A;   PHOTOCOAGULATION WITH LASER Left 11/24/2014   Procedure: PHOTOCOAGULATION WITH LASER;  Surgeon: Hurman Horn, MD;  Location: [place];  Service: Ophthalmology;  Laterality: Left;  with insertion of silicone oil   SAVORY DILATION N/A 10/02/2016   Procedure: SAVORY DILATION;  Surgeon: Danie Binder, MD;  [facility];  Service: Endoscopy;  Laterality: N/A;   TUBAL LIGATION     UPPER EXTREMITY VENOGRAPHY Bilateral 11/02/2019   Procedure: UPPER EXTREMITY VENOGRAPHY;  Surgeon: Waynetta Sandy, MD;  Location: [place] [facility];  Service: Cardiovascular;  Laterality: Bilateral;    Social History   Socioeconomic History   Marital status: Single    Spouse name: Not on file   Number of children: 5   Years of education: GED   Highest education level: Not on file  Occupational History   Not on file  Tobacco Use   Smoking status: Every Day    Packs/day: 1.00     Years: 23.00    Pack years: 23.00    Types: Cigarettes    Start date: 12/03/2000   Smokeless tobacco: Never   Tobacco comments:    one pack daily  Vaping Use   Vaping Use: Never used  Substance and Sexual Activity   Alcohol use: No    Alcohol/week: 0.0 standard drinks   Drug use: No    Comment: Sober for 8 years   Sexual activity: Not Currently    Birth control/protection: Surgical  Other Topics Concern   Not on file  Social History Narrative   Not on file   Social Determinants of Health   Financial Resource Strain: Not on file  Food Insecurity: Not on file  Transportation Needs: Not on file  Physical Activity: Not on file  Stress: Not on file  Social Connections: Not on file  Intimate Partner Violence: Not on file  Family History  Problem Relation Age of Onset   COPD Mother    Cancer Father    Lymphoma Father    Diabetes Sister    Deep vein thrombosis Sister    Diabetes Brother    Hyperlipidemia Brother    Hypertension Brother    Mental retardation Sister    Alcohol abuse Paternal Grandmother    Colon cancer Neg Hx    Liver disease Neg Hx     Current Outpatient Medications  Medication Sig Dispense Refill   acetaminophen (TYLENOL) 500 MG tablet Take 1,000 mg by mouth every 6 (six) hours as needed for moderate pain or headache.     [START ON 03/16/2021] ARIPiprazole (ABILIFY) 5 MG tablet Take 1 tablet (5 mg total) by mouth daily. 90 tablet 0   azelastine (ASTELIN) 0.1 % nasal spray Place 1 spray into both nostrils 2 (two) times daily. Use in each nostril as directed 30 mL 12   carvedilol (COREG) 12.5 MG tablet Take 1 tablet (12.5 mg total) by mouth 2 (two) times daily. 60 tablet 5   COMBIGAN 0.2-0.5 % ophthalmic solution INSTILL ONE DROP IN [facility] EYE TWICE DAILY (Patient taking differently: Place 1 drop into both eyes at bedtime.) 10 mL 0   Continuous Blood Gluc Sensor (FREESTYLE LIBRE 14 DAY SENSOR) MISC Inject 1 each into the skin every 14 (fourteen) days. Use as  directed. 2 each 2   cyclobenzaprine (FLEXERIL) 5 MG tablet TAKE ONE TABLET BY MOUTH THREE TIMES DAILY AS NEEDED FOR MUSCLE SPASM 60 tablet 3   dexamethasone (DECADRON) 4 MG tablet Take 1 tablet (4 mg total) by mouth daily. 5 tablet 0   fluconazole (DIFLUCAN) 100 MG tablet Take 1 tablet (100 mg total) by mouth daily. 14 tablet 0   fluticasone (FLONASE) 50 MCG/ACT nasal spray Place 2 sprays into both nostrils daily as needed for allergies or rhinitis.     gabapentin (NEURONTIN) 100 MG capsule Take 1 capsule (100 mg total) by mouth 3 (three) times daily. (Needs to be seen before next refill) 90 capsule 0   glucose blood (PRODIGY NO CODING BLOOD GLUC) test strip USE TO CHECK BLOOD SUGAR TWICE DAILY. Dx E11.22 200 each 3   hydrALAZINE (APRESOLINE) 50 MG tablet Take 1 tablet (50 mg total) by mouth 2 (two) times daily. 60 tablet 3   Insulin Pen Needle (TRUEPLUS PEN NEEDLES) 31G X 5 MM MISC Use daily with insulin Dx E11.22 100 each 3   LANTUS SOLOSTAR 100 UNIT/ML Solostar Pen INJECT 18-50 UNITS SUBCUTANEOUSLY AT BEDTIME. (Patient taking differently: Inject 20 Units into the skin at bedtime.) 15 mL 0   lidocaine-prilocaine (EMLA) cream Apply 1 application topically Every Tuesday,Thursday,and Saturday with dialysis.     linagliptin (TRADJENTA) 5 MG TABS tablet Take 1 tablet (5 mg total) by mouth daily. (Patient taking differently: Take 5 mg by mouth every evening.) 90 tablet 3   multivitamin (RENA-VIT) TABS tablet Take 1 tablet by mouth daily.     polyethylene glycol-electrolytes (TRILYTE) 420 g solution Take 4,000 mLs by mouth as directed. 4000 mL 0   PRODIGY TWIST TOP LANCETS 28G MISC USE TO CHECK BLOOD SUGAR UP TO FOUR TIMES DAILY. 100 each 1   sertraline (ZOLOFT) 100 MG tablet Take 1.5 tablets (150 mg total) by mouth daily. (Patient taking differently: Take 150 mg by mouth at bedtime.) 135 tablet 1   sevelamer carbonate (RENVELA) 800 MG tablet Take 800-2,400 mg by mouth See admin instructions. Take 2456m  by mouth three  times daily with means and 813m with snacks     SYMBICORT 160-4.5 MCG/ACT inhaler Inhale 2 puffs into the lungs 2 (two) times daily. 10.2 g 2   VENTOLIN HFA 108 (90 Base) MCG/ACT inhaler Inhale 2 puffs into the lungs every 4 (four) hours as needed for wheezing or shortness of breath. 18 g 2   zolpidem (AMBIEN) 5 MG tablet Take 1 tablet (5 mg total) by mouth at bedtime as needed for sleep. 30 tablet 2   No current facility-administered medications for this visit.    Allergies  Allergen Reactions   Bactrim [Sulfamethoxazole-Trimethoprim] Nausea And Vomiting   Prednisone Other (See Comments)    "I was wide open and couldn't eat" per pt. Loss of appetite and insomnia  LOSS OF APPETITE,UNABLE TO SLEEP     REVIEW OF SYSTEMS:   [X]  denotes positive finding, [ ]  denotes negative finding Cardiac  Comments:  Chest pain or chest pressure:    Shortness of breath upon exertion:    Short of breath when lying flat:    Irregular heart rhythm:        Vascular    Pain in calf, thigh, or hip brought on by ambulation:    Pain in feet at night that wakes you up from your sleep:     Blood clot in your veins:    Leg swelling:         Pulmonary    Oxygen at home:    Productive cough:     Wheezing:         Neurologic    Sudden weakness in arms or legs:     Sudden numbness in arms or legs:     Sudden onset of difficulty speaking or slurred speech:    Temporary loss of vision in one eye:     Problems with dizziness:         Gastrointestinal    Blood in stool:     Vomited blood:         Genitourinary    Burning when urinating:     Blood in urine:        Psychiatric    Major depression:         Hematologic    Bleeding problems:    Problems with blood clotting too easily:        Skin    Rashes or ulcers:        Constitutional    Fever or chills:      PHYSICAL EXAMINATION:  Vitals:   02/20/21 1500  BP: 130/60  Pulse: 63  Resp: 20  Temp: 98.2 F (36.8 C)   TempSrc: Temporal  SpO2: 95%  Weight: 181 lb (82.1 kg)  Height: 5' 3"  (1.6 m)    General:  WDWN in NAD; vital signs documented above Gait: Not observed HENT: WNL, normocephalic Pulmonary: normal non-labored breathing , without Rales, rhonchi,  wheezing Cardiac: regular HR Abdomen: soft, NT, no masses Skin: without rashes Vascular Exam/Pulses:  Right Left  Radial 2+ (normal) 2+ (normal)   Extremities: Small focal pseudoaneurysm in the mid left upper arm AV graft without any skin compromise; easily palpable thrill near the arterial anastomosis however flow is more pulsatile as he moved towards the axilla Musculoskeletal: no muscle wasting or atrophy  Neurologic: A&O X 3;  No focal weakness or paresthesias are detected Psychiatric:  The pt has Normal affect.    ASSESSMENT/PLAN:: 50y.o. female here for follow up for evaluation of left arm AV  graft pseudoaneurysm  -Pseudoaneurysm of left upper arm AV graft without skin compromise or overlying wound -Flow is pulsatile as he move towards the axilla thus there is suspicion for outflow stenosis -Plan will be for left arm shuntogram with possible intervention on a nondialysis day in the next 1 to 2 weeks -Procedure was discussed with the patient including risks and she agrees to proceed   Dagoberto Ligas, PA-C [facility] [phone]  Clinic MD:   Trula Slade

## 2021-02-20 NOTE — Progress Notes (Signed)
Office Note     CC:  follow up Requesting Provider:  Medicine, [facility]  HPI: Kara Knapp is a 50 y.o. (1971/04/19) female who presents for evaluation of pseudoaneurysm of left arm AV graft.  Left upper arm AV graft was placed in April 2021.  She was referred back to our office by nephrology PA due to noticeable enlarging pseudoaneurysm of left arm.  She is still able to complete HD treatments from left arm AV graft.  She is dialyzing on a Tuesday Thursday Saturday schedule at [facility] in [place].  Prior access includes right arm AV graft   Past Medical History:  Diagnosis Date   Anemia of chronic disease    Anxiety    Asthma    Blind left eye    Bronchitis    Cataract    Cholecystitis, acute 05/26/2013   Status post cholecystectomy   Chronic abdominal pain    Chronic diarrhea    COPD (chronic obstructive pulmonary disease) (HCC)    Depression    Diabetic foot ulcer ([place]) 81/27/5170   Diastolic heart failure ([place])    ESRD on hemodialysis ([place])    Started diaylsis 12/29/15   Essential hypertension    Fibroids    GERD (gastroesophageal reflux disease)    Glaucoma    History of blood transfusion    History of pneumonia    Hyperlipidemia    Insulin-dependent diabetes mellitus with retinopathy    Liver fibrosis    Negative Hep B surface antigen, negative Hep C antibody Feb 2018 (see scanned in labs).   Neuropathy    Osteomyelitis (HCC)    Toe on left foot    Past Surgical History:  Procedure Laterality Date   A/V SHUNTOGRAM N/A 10/25/2016   Procedure: A/V Shuntogram - Right Arm;  Surgeon: Waynetta Sandy, MD;  Location: [facility];  Service: Cardiovascular;  Laterality: N/A;   A/V SHUNTOGRAM N/A 03/05/2018   Procedure: A/V SHUNTOGRAM - Right Arm;  Surgeon: Waynetta Sandy, MD;  Location: [facility];  Service: Cardiovascular;  Laterality: N/A;   AV FISTULA PLACEMENT Right 10/17/2015   Procedure: INSERTION OF ARTERIOVENOUS  GORE-TEX GRAFT RIGHT UPPER ARM WITH ACUSEAL;  Surgeon: Conrad [place], MD;  Location: [facility];  Service: Vascular;  Laterality: Right;   AV FISTULA PLACEMENT Left 11/18/2019   Procedure: INSERTION OF ARTERIOVENOUS (AV) GORE-TEX GRAFT left  ARM;  Surgeon: Serafina Mitchell, MD;  Location: [facility];  Service: Vascular;  Laterality: Left;   BIOPSY  01/02/2021   Procedure: BIOPSY;  Surgeon: Eloise Harman, DO;  [facility];  Service: Endoscopy;;   CATARACT EXTRACTION W/ INTRAOCULAR LENS IMPLANT Bilateral    CESAREAN SECTION     CHOLECYSTECTOMY N/A 05/25/2013   Procedure: LAPAROSCOPIC CHOLECYSTECTOMY;  Surgeon: Jamesetta So, MD;  Location: [facility];  Service: General;  Laterality: N/A;   COLONOSCOPY WITH PROPOFOL N/A 10/02/2016   two 3 to 5 mm polyps in the descending colon, three 2 to 3 mm polyps in the rectum, random colon biopsies, rectal bleeding due to internal hemorrhoids, friability with no bleeding at the anus status post biopsy.  Surgical pathology found the polyps to be a mix of hyperplastic and tubular adenoma, random colon biopsies to be benign colonic mucosa, and the anal biopsies to be anal skin tag.    ESOPHAGOGASTRODUODENOSCOPY (EGD) WITH PROPOFOL N/A 10/02/2016   mucosal nodule in the esophagus status post biopsy, moderate gastritis status post biopsy, mild  duodenitis. esophageal biopsy to be benign, gastric biopsies to be gastritis due to aspirin use, and duodenal biopsies to be duodenitis due to aspirin use   ESOPHAGOGASTRODUODENOSCOPY (EGD) WITH PROPOFOL N/A 01/02/2021   Procedure: ESOPHAGOGASTRODUODENOSCOPY (EGD) WITH PROPOFOL;  Surgeon: Eloise Harman, DO;  [facility];  Service: Endoscopy;  Laterality: N/A;   EYE SURGERY Bilateral    IR FLUORO GUIDE CV LINE RIGHT  10/09/2019   IR REMOVAL TUN CV CATH W/O FL  01/15/2020   IR THROMBECTOMY AV FISTULA W/THROMBOLYSIS/PTA INC/SHUNT/IMG RIGHT Right 12/26/2018   IR THROMBECTOMY AV FISTULA W/THROMBOLYSIS/PTA INC/SHUNT/IMG RIGHT  Right 08/10/2019   IR THROMBECTOMY AV FISTULA W/THROMBOLYSIS/PTA/STENT INC/SHUNT/IMG RT Right 08/08/2018   IR THROMBECTOMY AV FISTULA W/THROMBOLYSIS/PTA/STENT INC/SHUNT/IMG RT Right 05/15/2019   IR US GUIDE VASC ACCESS RIGHT  08/08/2018   IR US GUIDE VASC ACCESS RIGHT  12/26/2018   IR US GUIDE VASC ACCESS RIGHT  05/15/2019   IR US GUIDE VASC ACCESS RIGHT  08/10/2019   IR US GUIDE VASC ACCESS RIGHT  10/09/2019   PARS PLANA VITRECTOMY Left 11/24/2014   Procedure: PARS PLANA VITRECTOMY WITH 25 GAUGE;  Surgeon: Hurman Horn, MD;  Location: [facility];  Service: Ophthalmology;  Laterality: Left;   PERIPHERAL VASCULAR BALLOON ANGIOPLASTY Right 03/05/2018   Procedure: PERIPHERAL VASCULAR BALLOON ANGIOPLASTY;  Surgeon: Waynetta Sandy, MD;  Location: [facility];  Service: Cardiovascular;  Laterality: Right;  arm fistula   PERIPHERAL VASCULAR CATHETERIZATION N/A 04/28/2015   Procedure: Bilateral Upper Extremity Venography;  Surgeon: Conrad [place], MD;  Location: [facility];  Service: Cardiovascular;  Laterality: N/A;   PHOTOCOAGULATION WITH LASER Left 11/24/2014   Procedure: PHOTOCOAGULATION WITH LASER;  Surgeon: Hurman Horn, MD;  Location: [facility];  Service: Ophthalmology;  Laterality: Left;  with insertion of silicone oil   SAVORY DILATION N/A 10/02/2016   Procedure: SAVORY DILATION;  Surgeon: Danie Binder, MD;  [facility];  Service: Endoscopy;  Laterality: N/A;   TUBAL LIGATION     UPPER EXTREMITY VENOGRAPHY Bilateral 11/02/2019   Procedure: UPPER EXTREMITY VENOGRAPHY;  Surgeon: Waynetta Sandy, MD;  Location: [facility];  Service: Cardiovascular;  Laterality: Bilateral;    Social History   Socioeconomic History   Marital status: Single    Spouse name: Not on file   Number of children: 5   Years of education: GED   Highest education level: Not on file  Occupational History   Not on file  Tobacco Use   Smoking status: Every Day    Packs/day: 1.00     Years: 23.00    Pack years: 23.00    Types: Cigarettes    Start date: 12/03/2000   Smokeless tobacco: Never   Tobacco comments:    one pack daily  Vaping Use   Vaping Use: Never used  Substance and Sexual Activity   Alcohol use: No    Alcohol/week: 0.0 standard drinks   Drug use: No    Comment: Sober for 8 years   Sexual activity: Not Currently    Birth control/protection: Surgical  Other Topics Concern   Not on file  Social History Narrative   Not on file   Social Determinants of Health   Financial Resource Strain: Not on file  Food Insecurity: Not on file  Transportation Needs: Not on file  Physical Activity: Not on file  Stress: Not on file  Social Connections: Not on file  Intimate Partner Violence: Not on file  Family History  Problem Relation Age of Onset   COPD Mother    Cancer Father    Lymphoma Father    Diabetes Sister    Deep vein thrombosis Sister    Diabetes Brother    Hyperlipidemia Brother    Hypertension Brother    Mental retardation Sister    Alcohol abuse Paternal Grandmother    Colon cancer Neg Hx    Liver disease Neg Hx     Current Outpatient Medications  Medication Sig Dispense Refill   acetaminophen (TYLENOL) 500 MG tablet Take 1,000 mg by mouth every 6 (six) hours as needed for moderate pain or headache.     [START ON 03/16/2021] ARIPiprazole (ABILIFY) 5 MG tablet Take 1 tablet (5 mg total) by mouth daily. 90 tablet 0   azelastine (ASTELIN) 0.1 % nasal spray Place 1 spray into both nostrils 2 (two) times daily. Use in each nostril as directed 30 mL 12   carvedilol (COREG) 12.5 MG tablet Take 1 tablet (12.5 mg total) by mouth 2 (two) times daily. 60 tablet 5   COMBIGAN 0.2-0.5 % ophthalmic solution INSTILL ONE DROP IN [facility] EYE TWICE DAILY (Patient taking differently: Place 1 drop into both eyes at bedtime.) 10 mL 0   Continuous Blood Gluc Sensor (FREESTYLE LIBRE 14 DAY SENSOR) MISC Inject 1 each into the skin every 14 (fourteen) days. Use as  directed. 2 each 2   cyclobenzaprine (FLEXERIL) 5 MG tablet TAKE ONE TABLET BY MOUTH THREE TIMES DAILY AS NEEDED FOR MUSCLE SPASM 60 tablet 3   dexamethasone (DECADRON) 4 MG tablet Take 1 tablet (4 mg total) by mouth daily. 5 tablet 0   fluconazole (DIFLUCAN) 100 MG tablet Take 1 tablet (100 mg total) by mouth daily. 14 tablet 0   fluticasone (FLONASE) 50 MCG/ACT nasal spray Place 2 sprays into both nostrils daily as needed for allergies or rhinitis.     gabapentin (NEURONTIN) 100 MG capsule Take 1 capsule (100 mg total) by mouth 3 (three) times daily. (Needs to be seen before next refill) 90 capsule 0   glucose blood (PRODIGY NO CODING BLOOD GLUC) test strip USE TO CHECK BLOOD SUGAR TWICE DAILY. Dx E11.22 200 each 3   hydrALAZINE (APRESOLINE) 50 MG tablet Take 1 tablet (50 mg total) by mouth 2 (two) times daily. 60 tablet 3   Insulin Pen Needle (TRUEPLUS PEN NEEDLES) 31G X 5 MM MISC Use daily with insulin Dx E11.22 100 each 3   LANTUS SOLOSTAR 100 UNIT/ML Solostar Pen INJECT 18-50 UNITS SUBCUTANEOUSLY AT BEDTIME. (Patient taking differently: Inject 20 Units into the skin at bedtime.) 15 mL 0   lidocaine-prilocaine (EMLA) cream Apply 1 application topically Every Tuesday,Thursday,and Saturday with dialysis.     linagliptin (TRADJENTA) 5 MG TABS tablet Take 1 tablet (5 mg total) by mouth daily. (Patient taking differently: Take 5 mg by mouth every evening.) 90 tablet 3   multivitamin (RENA-VIT) TABS tablet Take 1 tablet by mouth daily.     polyethylene glycol-electrolytes (TRILYTE) 420 g solution Take 4,000 mLs by mouth as directed. 4000 mL 0   PRODIGY TWIST TOP LANCETS 28G MISC USE TO CHECK BLOOD SUGAR UP TO FOUR TIMES DAILY. 100 each 1   sertraline (ZOLOFT) 100 MG tablet Take 1.5 tablets (150 mg total) by mouth daily. (Patient taking differently: Take 150 mg by mouth at bedtime.) 135 tablet 1   sevelamer carbonate (RENVELA) 800 MG tablet Take 800-2,400 mg by mouth See admin instructions. Take 2473m  by mouth three  times daily with means and 89m with snacks     SYMBICORT 160-4.5 MCG/ACT inhaler Inhale 2 puffs into the lungs 2 (two) times daily. 10.2 g 2   VENTOLIN HFA 108 (90 Base) MCG/ACT inhaler Inhale 2 puffs into the lungs every 4 (four) hours as needed for wheezing or shortness of breath. 18 g 2   zolpidem (AMBIEN) 5 MG tablet Take 1 tablet (5 mg total) by mouth at bedtime as needed for sleep. 30 tablet 2   No current facility-administered medications for this visit.    Allergies  Allergen Reactions   Bactrim [Sulfamethoxazole-Trimethoprim] Nausea And Vomiting   Prednisone Other (See Comments)    "I was wide open and couldn't eat" per pt. Loss of appetite and insomnia  LOSS OF APPETITE,UNABLE TO SLEEP     REVIEW OF SYSTEMS:   [X]  denotes positive finding, [ ]  denotes negative finding Cardiac  Comments:  Chest pain or chest pressure:    Shortness of breath upon exertion:    Short of breath when lying flat:    Irregular heart rhythm:        Vascular    Pain in calf, thigh, or hip brought on by ambulation:    Pain in feet at night that wakes you up from your sleep:     Blood clot in your veins:    Leg swelling:         Pulmonary    Oxygen at home:    Productive cough:     Wheezing:         Neurologic    Sudden weakness in arms or legs:     Sudden numbness in arms or legs:     Sudden onset of difficulty speaking or slurred speech:    Temporary loss of vision in one eye:     Problems with dizziness:         Gastrointestinal    Blood in stool:     Vomited blood:         Genitourinary    Burning when urinating:     Blood in urine:        Psychiatric    Major depression:         Hematologic    Bleeding problems:    Problems with blood clotting too easily:        Skin    Rashes or ulcers:        Constitutional    Fever or chills:      PHYSICAL EXAMINATION:  Vitals:   02/20/21 1500  BP: 130/60  Pulse: 63  Resp: 20  Temp: 98.2 F (36.8 C)   TempSrc: Temporal  SpO2: 95%  Weight: 181 lb (82.1 kg)  Height: 5' 3"  (1.6 m)    General:  WDWN in NAD; vital signs documented above Gait: Not observed HENT: WNL, normocephalic Pulmonary: normal non-labored breathing , without Rales, rhonchi,  wheezing Cardiac: regular HR Abdomen: soft, NT, no masses Skin: without rashes Vascular Exam/Pulses:  Right Left  Radial 2+ (normal) 2+ (normal)   Extremities: Small focal pseudoaneurysm in the mid left upper arm AV graft without any skin compromise; easily palpable thrill near the arterial anastomosis however flow is more pulsatile as he moved towards the axilla Musculoskeletal: no muscle wasting or atrophy  Neurologic: A&O X 3;  No focal weakness or paresthesias are detected Psychiatric:  The pt has Normal affect.    ASSESSMENT/PLAN:: 50y.o. female here for follow up for evaluation of left arm AV  graft pseudoaneurysm  -Pseudoaneurysm of left upper arm AV graft without skin compromise or overlying wound -Flow is pulsatile as he move towards the axilla thus there is suspicion for outflow stenosis -Plan will be for left arm shuntogram with possible intervention on a nondialysis day in the next 1 to 2 weeks -Procedure was discussed with the patient including risks and she agrees to proceed   Dagoberto Ligas, PA-C [facility] [phone]  Clinic MD:   Trula Slade

## 2021-02-21 ENCOUNTER — Other Ambulatory Visit: Payer: Self-pay

## 2021-02-21 DIAGNOSIS — N186 End stage renal disease: Secondary | ICD-10-CM | POA: Diagnosis not present

## 2021-02-21 DIAGNOSIS — Z7689 Persons encountering health services in other specified circumstances: Secondary | ICD-10-CM | POA: Diagnosis not present

## 2021-02-21 DIAGNOSIS — Z992 Dependence on renal dialysis: Secondary | ICD-10-CM | POA: Diagnosis not present

## 2021-02-22 ENCOUNTER — Ambulatory Visit (INDEPENDENT_AMBULATORY_CARE_PROVIDER_SITE_OTHER): Payer: Medicare Other | Admitting: Clinical

## 2021-02-22 ENCOUNTER — Other Ambulatory Visit: Payer: Self-pay

## 2021-02-22 DIAGNOSIS — F3341 Major depressive disorder, recurrent, in partial remission: Secondary | ICD-10-CM | POA: Diagnosis not present

## 2021-02-22 DIAGNOSIS — E1165 Type 2 diabetes mellitus with hyperglycemia: Secondary | ICD-10-CM | POA: Diagnosis not present

## 2021-02-22 NOTE — Progress Notes (Signed)
Virtual Visit via Telephone Note   I connected with Norva A Kassem on 02/22/21 at 11:00 AM EDT by telephone and verified that I am speaking with the correct person using two identifiers.   Location: Patient: Home Provider: Office    I discussed the limitations of evaluation and management by telemedicine and the availability of in person appointments. The patient expressed understanding and agreed to proceed.       THERAPIST PROGRESS NOTE   Session Time: 11:00 AM-11:45AM   Participation Level: Active   Behavioral Response: CasualAlertDepressed   Type of Therapy: Individual Therapy   Treatment Goals addressed: Coping   Interventions: CBT   Summary: Achsah A Hodder is a 50 y.o. female who presents with Depression. The OPT therapist worked with the patient for her ongoing OPT treatment session. The OPT therapist utilized Motivational Interviewing to assist in creating therapeutic repore. The patient in the session was engaged and work in collaboration giving feedback about her triggers and symptoms over the past few weeks. The OPT therapist utilized Cognitive Behavioral Therapy through cognitive restructuring as well as worked with the patient on coping strategies to assist in management of mood and as she continues to work on her interactions with others. The patient reported the she continues to work on implementing positive thinking, self esteem, and staying active as well as being aware of and consistent in keeping her health care appointments. The patient spoke about gains she feels she has been getting from better managing her emotions.    Suicidal/Homicidal: Nowithout intent/plan   Therapist Response: The OPT therapist worked with the patient for the patients scheduled session. The patient was engaged in his session and gave feedback in relation to triggers, symptoms, and behavior responses over the past few weeks. The OPT therapist worked with the patient utilizing an in session  Cognitive Behavioral Therapy exercise. The patient was responsive in the session and verbalized, "I have done so much better with not taking how I am feeling out on other people and this is good for me because then I do not have guilt over things I said when I was mad". The OPT therapist worked with the patient on implementing positive thinking and reviewing upcoming care appointments. The OPT therapist gauged the patents mood through scale measuring within the session. The OPT therapist will continue treatment work with the patient in her next scheduled session   Plan: Return again in 3 weeks.   Diagnosis:      Axis I: MDD (major depressive disorder), recurrent, in partial remission                              Axis II: No diagnosis   I discussed the assessment and treatment plan with the patient. The patient was provided an opportunity to ask questions and all were answered. The patient agreed with the plan and demonstrated an understanding of the instructions.   The patient was advised to call back or seek an in-person evaluation if the symptoms worsen or if the condition fails to improve as anticipated.   I provided 30 minutes of non-face-to-face time during this encounter.   Lennox Grumbles, LCSW   02/22/2021

## 2021-02-23 DIAGNOSIS — N186 End stage renal disease: Secondary | ICD-10-CM | POA: Diagnosis not present

## 2021-02-23 DIAGNOSIS — Z7689 Persons encountering health services in other specified circumstances: Secondary | ICD-10-CM | POA: Diagnosis not present

## 2021-02-23 DIAGNOSIS — Z992 Dependence on renal dialysis: Secondary | ICD-10-CM | POA: Diagnosis not present

## 2021-02-23 DIAGNOSIS — J439 Emphysema, unspecified: Secondary | ICD-10-CM | POA: Diagnosis not present

## 2021-02-25 DIAGNOSIS — Z992 Dependence on renal dialysis: Secondary | ICD-10-CM | POA: Diagnosis not present

## 2021-02-25 DIAGNOSIS — Z7689 Persons encountering health services in other specified circumstances: Secondary | ICD-10-CM | POA: Diagnosis not present

## 2021-02-25 DIAGNOSIS — N186 End stage renal disease: Secondary | ICD-10-CM | POA: Diagnosis not present

## 2021-02-26 DIAGNOSIS — N186 End stage renal disease: Secondary | ICD-10-CM | POA: Diagnosis not present

## 2021-02-26 DIAGNOSIS — Z992 Dependence on renal dialysis: Secondary | ICD-10-CM | POA: Diagnosis not present

## 2021-02-26 DIAGNOSIS — J449 Chronic obstructive pulmonary disease, unspecified: Secondary | ICD-10-CM | POA: Diagnosis not present

## 2021-02-26 DIAGNOSIS — E119 Type 2 diabetes mellitus without complications: Secondary | ICD-10-CM | POA: Diagnosis not present

## 2021-02-28 DIAGNOSIS — J439 Emphysema, unspecified: Secondary | ICD-10-CM | POA: Diagnosis not present

## 2021-02-28 DIAGNOSIS — N390 Urinary tract infection, site not specified: Secondary | ICD-10-CM | POA: Diagnosis not present

## 2021-02-28 DIAGNOSIS — I10 Essential (primary) hypertension: Secondary | ICD-10-CM | POA: Diagnosis not present

## 2021-02-28 DIAGNOSIS — Z992 Dependence on renal dialysis: Secondary | ICD-10-CM | POA: Diagnosis not present

## 2021-02-28 DIAGNOSIS — N186 End stage renal disease: Secondary | ICD-10-CM | POA: Diagnosis not present

## 2021-02-28 DIAGNOSIS — Z7689 Persons encountering health services in other specified circumstances: Secondary | ICD-10-CM | POA: Diagnosis not present

## 2021-02-28 DIAGNOSIS — Z299 Encounter for prophylactic measures, unspecified: Secondary | ICD-10-CM | POA: Diagnosis not present

## 2021-02-28 DIAGNOSIS — E1122 Type 2 diabetes mellitus with diabetic chronic kidney disease: Secondary | ICD-10-CM | POA: Diagnosis not present

## 2021-03-03 ENCOUNTER — Telehealth: Payer: Self-pay | Admitting: Internal Medicine

## 2021-03-03 ENCOUNTER — Telehealth: Payer: Self-pay | Admitting: *Deleted

## 2021-03-03 NOTE — Telephone Encounter (Signed)
Called pt and made aware of recs. Will keep TCS as scheduled and she will try to complete prep.

## 2021-03-03 NOTE — Telephone Encounter (Signed)
Vicente Males saw patient last while inpatient (June 2022). I last saw her in 04/2020.   Per Epic records, based on reasoning behind needing a colonoscopy, worsening IDA, surveillance for colon adenomas, there is no alternate test that would take place of colonoscopy.   While inpatient, patient only drank four glasses of prep (she reported she was clear) and stopped so procedure was cancelled.   Strongly recommend she complete colonoscopy as advised.

## 2021-03-03 NOTE — Telephone Encounter (Signed)
PA for TCS done via [facility]. Approved #[id number]  DOS Mar 10, 2021 - Jun 08, 2021

## 2021-03-03 NOTE — Telephone Encounter (Signed)
Pt called to say that she is scheduled a procedure for 8/12 with Dr Abbey Chatters and she has concerns about taking her prep and being blind with no one to help her. She is afraid of falling if she "makes a mess". She is wanting to speak to a nurse. [phone]

## 2021-03-03 NOTE — Telephone Encounter (Signed)
Called pt. She states she has very limited vision and not able to see her floor. She is afraid to do the prep and trying to get from the bed to potty chair (beside bed). Reports she does not have any help after 5pm and just wants to cancel her procedure. She states even when she prepped in the hospital before she was making a mess everywhere and just doesn't want to go through with that again. She wants to know if something else can be done to see what's going on without having to do a colonoscopy. Please advise Magda Paganini thanks

## 2021-03-04 DIAGNOSIS — Z7689 Persons encountering health services in other specified circumstances: Secondary | ICD-10-CM | POA: Diagnosis not present

## 2021-03-04 DIAGNOSIS — Z992 Dependence on renal dialysis: Secondary | ICD-10-CM | POA: Diagnosis not present

## 2021-03-04 DIAGNOSIS — N186 End stage renal disease: Secondary | ICD-10-CM | POA: Diagnosis not present

## 2021-03-06 NOTE — Telephone Encounter (Signed)
Noted  

## 2021-03-07 ENCOUNTER — Encounter (HOSPITAL_COMMUNITY)
Admission: RE | Admit: 2021-03-07 | Discharge: 2021-03-07 | Disposition: A | Discharge status: Home / Self Care | Payer: Medicare Other | Source: Ambulatory Visit | Attending: Internal Medicine | Admitting: Internal Medicine

## 2021-03-07 ENCOUNTER — Other Ambulatory Visit: Payer: Self-pay

## 2021-03-07 ENCOUNTER — Encounter (HOSPITAL_COMMUNITY): Payer: Self-pay

## 2021-03-07 DIAGNOSIS — Z992 Dependence on renal dialysis: Secondary | ICD-10-CM | POA: Diagnosis not present

## 2021-03-07 DIAGNOSIS — Z7689 Persons encountering health services in other specified circumstances: Secondary | ICD-10-CM | POA: Diagnosis not present

## 2021-03-07 DIAGNOSIS — N186 End stage renal disease: Secondary | ICD-10-CM | POA: Diagnosis not present

## 2021-03-10 ENCOUNTER — Encounter (HOSPITAL_COMMUNITY)
Admission: RE | Disposition: A | Discharge status: Home / Self Care | Payer: Self-pay | Source: Home / Self Care | Attending: Internal Medicine

## 2021-03-10 ENCOUNTER — Ambulatory Visit (HOSPITAL_COMMUNITY): Payer: Medicare Other | Admitting: Anesthesiology

## 2021-03-10 ENCOUNTER — Encounter (HOSPITAL_COMMUNITY): Payer: Self-pay

## 2021-03-10 ENCOUNTER — Ambulatory Visit (HOSPITAL_COMMUNITY)
Admission: RE | Admit: 2021-03-10 | Discharge: 2021-03-10 | Disposition: A | Discharge status: Home / Self Care | Payer: Medicare Other | Attending: Internal Medicine | Admitting: Internal Medicine

## 2021-03-10 ENCOUNTER — Other Ambulatory Visit: Payer: Self-pay

## 2021-03-10 DIAGNOSIS — Z881 Allergy status to other antibiotic agents status: Secondary | ICD-10-CM | POA: Insufficient documentation

## 2021-03-10 DIAGNOSIS — Z8349 Family history of other endocrine, nutritional and metabolic diseases: Secondary | ICD-10-CM | POA: Diagnosis not present

## 2021-03-10 DIAGNOSIS — Z7952 Long term (current) use of systemic steroids: Secondary | ICD-10-CM | POA: Diagnosis not present

## 2021-03-10 DIAGNOSIS — Z882 Allergy status to sulfonamides status: Secondary | ICD-10-CM | POA: Insufficient documentation

## 2021-03-10 DIAGNOSIS — I5032 Chronic diastolic (congestive) heart failure: Secondary | ICD-10-CM | POA: Diagnosis not present

## 2021-03-10 DIAGNOSIS — Z7951 Long term (current) use of inhaled steroids: Secondary | ICD-10-CM | POA: Insufficient documentation

## 2021-03-10 DIAGNOSIS — N186 End stage renal disease: Secondary | ICD-10-CM | POA: Diagnosis not present

## 2021-03-10 DIAGNOSIS — K635 Polyp of colon: Secondary | ICD-10-CM

## 2021-03-10 DIAGNOSIS — K648 Other hemorrhoids: Secondary | ICD-10-CM | POA: Insufficient documentation

## 2021-03-10 DIAGNOSIS — D509 Iron deficiency anemia, unspecified: Secondary | ICD-10-CM | POA: Insufficient documentation

## 2021-03-10 DIAGNOSIS — E1122 Type 2 diabetes mellitus with diabetic chronic kidney disease: Secondary | ICD-10-CM | POA: Insufficient documentation

## 2021-03-10 DIAGNOSIS — Z833 Family history of diabetes mellitus: Secondary | ICD-10-CM | POA: Diagnosis not present

## 2021-03-10 DIAGNOSIS — I132 Hypertensive heart and chronic kidney disease with heart failure and with stage 5 chronic kidney disease, or end stage renal disease: Secondary | ICD-10-CM | POA: Diagnosis not present

## 2021-03-10 DIAGNOSIS — D124 Benign neoplasm of descending colon: Secondary | ICD-10-CM | POA: Diagnosis not present

## 2021-03-10 DIAGNOSIS — Z992 Dependence on renal dialysis: Secondary | ICD-10-CM | POA: Insufficient documentation

## 2021-03-10 DIAGNOSIS — Z794 Long term (current) use of insulin: Secondary | ICD-10-CM | POA: Insufficient documentation

## 2021-03-10 DIAGNOSIS — J441 Chronic obstructive pulmonary disease with (acute) exacerbation: Secondary | ICD-10-CM | POA: Diagnosis not present

## 2021-03-10 DIAGNOSIS — Z8249 Family history of ischemic heart disease and other diseases of the circulatory system: Secondary | ICD-10-CM | POA: Diagnosis not present

## 2021-03-10 DIAGNOSIS — F17200 Nicotine dependence, unspecified, uncomplicated: Secondary | ICD-10-CM | POA: Diagnosis not present

## 2021-03-10 DIAGNOSIS — Z888 Allergy status to other drugs, medicaments and biological substances status: Secondary | ICD-10-CM | POA: Diagnosis not present

## 2021-03-10 DIAGNOSIS — F17210 Nicotine dependence, cigarettes, uncomplicated: Secondary | ICD-10-CM | POA: Insufficient documentation

## 2021-03-10 DIAGNOSIS — Z7689 Persons encountering health services in other specified circumstances: Secondary | ICD-10-CM | POA: Diagnosis not present

## 2021-03-10 HISTORY — PX: POLYPECTOMY: SHX149

## 2021-03-10 HISTORY — PX: COLONOSCOPY WITH PROPOFOL: SHX5780

## 2021-03-10 LAB — POCT I-STAT, CHEM 8
BUN: 40 mg/dL — ABNORMAL HIGH (ref 6–20)
Calcium, Ion: 1.12 mmol/L — ABNORMAL LOW (ref 1.15–1.40)
Chloride: 104 mmol/L (ref 98–111)
Creatinine, Ser: 8.9 mg/dL — ABNORMAL HIGH (ref 0.44–1.00)
Glucose, Bld: 167 mg/dL — ABNORMAL HIGH (ref 70–99)
HCT: 36 % (ref 36.0–46.0)
Hemoglobin: 12.2 g/dL (ref 12.0–15.0)
Potassium: 4.4 mmol/L (ref 3.5–5.1)
Sodium: 139 mmol/L (ref 135–145)
TCO2: 24 mmol/L (ref 22–32)

## 2021-03-10 SURGERY — COLONOSCOPY WITH PROPOFOL
Anesthesia: General

## 2021-03-10 MED ORDER — SODIUM CHLORIDE 0.9 % IV SOLN: INTRAVENOUS | Status: DC

## 2021-03-10 MED ORDER — PROPOFOL 500 MG/50ML IV EMUL
INTRAVENOUS | Status: DC | PRN
  Administered 2021-03-10: 60 mg via INTRAVENOUS
  Administered 2021-03-10: 100 ug/kg/min via INTRAVENOUS

## 2021-03-10 MED ORDER — LACTATED RINGERS IV SOLN: INTRAVENOUS | Status: DC

## 2021-03-10 MED ORDER — LIDOCAINE HCL (CARDIAC) PF 100 MG/5ML IV SOSY
PREFILLED_SYRINGE | INTRAVENOUS | Status: DC | PRN
Start: 2021-03-10 — End: 2021-03-10
  Administered 2021-03-10: 60 mg via INTRAVENOUS

## 2021-03-10 MED ORDER — STERILE WATER FOR IRRIGATION IR SOLN
Status: DC | PRN
  Administered 2021-03-10: 200 mL

## 2021-03-10 NOTE — Anesthesia Postprocedure Evaluation (Signed)
Anesthesia Post Note  Patient: Megha A Mccomas  Procedure(s) Performed: COLONOSCOPY WITH PROPOFOL POLYPECTOMY INTESTINAL  Patient location during evaluation: PACU Anesthesia Type: General Level of consciousness: awake and alert Pain management: pain level controlled Vital Signs Assessment: post-procedure vital signs reviewed and stable Respiratory status: spontaneous breathing, nonlabored ventilation, respiratory function stable and patient connected to nasal cannula oxygen Cardiovascular status: blood pressure returned to baseline and stable Postop Assessment: no apparent nausea or vomiting Anesthetic complications: no   No notable events documented.   Last Vitals:  Vitals:   03/10/21 1129 03/10/21 1211  BP: (!) 169/64 119/67  Pulse: 60   Resp: (!) 24 (!) 24  Temp: 36.4 C 36.4 C  SpO2: 100% 94%    Last Pain:  Vitals:   03/10/21 1211  TempSrc: Oral  PainSc: 0-No pain                 Nicanor Alcon

## 2021-03-10 NOTE — Progress Notes (Deleted)
NO SHOW

## 2021-03-10 NOTE — Transfer of Care (Signed)
Immediate Anesthesia Transfer of Care Note  Patient: Kara Knapp  Procedure(s) Performed: COLONOSCOPY WITH PROPOFOL POLYPECTOMY INTESTINAL  Patient Location: Short Stay  Anesthesia Type:General  Level of Consciousness: awake, alert , oriented and patient cooperative  Airway & Oxygen Therapy: Patient Spontanous Breathing  Post-op Assessment: Report given to RN, Post -op Vital signs reviewed and stable and Patient moving all extremities  Post vital signs: Reviewed and stable  Last Vitals:  Vitals Value Taken Time  BP    Temp    Pulse    Resp    SpO2      Last Pain:  Vitals:   03/10/21 1152  TempSrc:   PainSc: 0-No pain         Complications: No notable events documented.

## 2021-03-10 NOTE — Anesthesia Preprocedure Evaluation (Addendum)
Anesthesia Evaluation  Patient identified by MRN, date of birth, ID band Patient awake    Reviewed: Allergy & Precautions, NPO status , Patient's Chart, lab work & pertinent test results  Airway Mallampati: II  TM Distance: >3 FB Neck ROM: Full    Dental  (+) Edentulous Upper, Edentulous Lower   Pulmonary asthma , COPD, Current Smoker and Patient abstained from smoking.,   Mild exp wheezing   + wheezing      Cardiovascular hypertension, +CHF  Normal cardiovascular exam Rhythm:Regular Rate:Normal     Neuro/Psych Anxiety Depression negative neurological ROS  negative psych ROS   GI/Hepatic Neg liver ROS, GERD  ,  Endo/Other  diabetes  Renal/GU Renal disease  negative genitourinary   Musculoskeletal negative musculoskeletal ROS (+)   Abdominal   Peds negative pediatric ROS (+)  Hematology negative hematology ROS (+) anemia ,   Anesthesia Other Findings   Reproductive/Obstetrics negative OB ROS                           Anesthesia Physical Anesthesia Plan  ASA: 2  Anesthesia Plan: General   Post-op Pain Management:    Induction: Intravenous  PONV Risk Score and Plan: TIVA  Airway Management Planned: Nasal Cannula  Additional Equipment:   Intra-op Plan:   Post-operative Plan:   Informed Consent: I have reviewed the patients History and Physical, chart, labs and discussed the procedure including the risks, benefits and alternatives for the proposed anesthesia with the patient or authorized representative who has indicated his/her understanding and acceptance.       Plan Discussed with: CRNA  Anesthesia Plan Comments:         Anesthesia Quick Evaluation

## 2021-03-10 NOTE — H&P (Signed)
Primary Care Physician:  Medicine, [facility] Internal Primary Gastroenterologist:  Dr. Abbey Chatters  Pre-Procedure History & Physical: HPI:  Kara Knapp is a 50 y.o. female is here for a colonoscopy to be performed for iron deficiency anemia.   Past Medical History:  Diagnosis Date   Anemia of chronic disease    Anxiety    Asthma    Blind left eye    Bronchitis    Cataract    Cholecystitis, acute 05/26/2013   Status post cholecystectomy   Chronic abdominal pain    Chronic diarrhea    COPD (chronic obstructive pulmonary disease) (HCC)    Depression    Diabetic foot ulcer ([place]) 81/85/6314   Diastolic heart failure ([place])    ESRD on hemodialysis ([place])    Started diaylsis 12/29/15   Essential hypertension    Fibroids    GERD (gastroesophageal reflux disease)    Glaucoma    History of blood transfusion    History of pneumonia    Hyperlipidemia    Insulin-dependent diabetes mellitus with retinopathy    Liver fibrosis    Negative Hep B surface antigen, negative Hep C antibody Feb 2018 (see scanned in labs).   Neuropathy    Osteomyelitis (HCC)    Toe on left foot    Past Surgical History:  Procedure Laterality Date   A/V SHUNTOGRAM N/A 10/25/2016   Procedure: A/V Shuntogram - Right Arm;  Surgeon: Waynetta Sandy, MD;  Location: [place] [facility];  Service: Cardiovascular;  Laterality: N/A;   A/V SHUNTOGRAM N/A 03/05/2018   Procedure: A/V SHUNTOGRAM - Right Arm;  Surgeon: Waynetta Sandy, MD;  Location: [place] [facility];  Service: Cardiovascular;  Laterality: N/A;   AV FISTULA PLACEMENT Right 10/17/2015   Procedure: INSERTION OF ARTERIOVENOUS GORE-TEX GRAFT RIGHT UPPER ARM WITH ACUSEAL;  Surgeon: Conrad , MD;  Location: [place];  Service: Vascular;  Laterality: Right;   AV FISTULA PLACEMENT Left 11/18/2019   Procedure: INSERTION OF ARTERIOVENOUS (AV) GORE-TEX GRAFT left  ARM;  Surgeon: Serafina Mitchell, MD;  Location: [place];  Service: Vascular;  Laterality: Left;    BIOPSY  01/02/2021   Procedure: BIOPSY;  Surgeon: Eloise Harman, DO;  [facility];  Service: Endoscopy;;   CATARACT EXTRACTION W/ INTRAOCULAR LENS IMPLANT Bilateral    CESAREAN SECTION     CHOLECYSTECTOMY N/A 05/25/2013   Procedure: LAPAROSCOPIC CHOLECYSTECTOMY;  Surgeon: Jamesetta So, MD;  Location: [facility];  Service: General;  Laterality: N/A;   COLONOSCOPY WITH PROPOFOL N/A 10/02/2016   two 3 to 5 mm polyps in the descending colon, three 2 to 3 mm polyps in the rectum, random colon biopsies, rectal bleeding due to internal hemorrhoids, friability with no bleeding at the anus status post biopsy.  Surgical pathology found the polyps to be a mix of hyperplastic and tubular adenoma, random colon biopsies to be benign colonic mucosa, and the anal biopsies to be anal skin tag.    ESOPHAGOGASTRODUODENOSCOPY (EGD) WITH PROPOFOL N/A 10/02/2016   mucosal nodule in the esophagus status post biopsy, moderate gastritis status post biopsy, mild duodenitis. esophageal biopsy to be benign, gastric biopsies to be gastritis due to aspirin use, and duodenal biopsies to be duodenitis due to aspirin use   ESOPHAGOGASTRODUODENOSCOPY (EGD) WITH PROPOFOL N/A 01/02/2021   Procedure: ESOPHAGOGASTRODUODENOSCOPY (EGD) WITH PROPOFOL;  Surgeon: Eloise Harman, DO;  [facility];  Service: Endoscopy;  Laterality: N/A;   EYE SURGERY Bilateral    IR FLUORO  GUIDE CV LINE RIGHT  10/09/2019   IR REMOVAL TUN CV CATH W/O FL  01/15/2020   IR THROMBECTOMY AV FISTULA W/THROMBOLYSIS/PTA INC/SHUNT/IMG RIGHT Right 12/26/2018   IR THROMBECTOMY AV FISTULA W/THROMBOLYSIS/PTA INC/SHUNT/IMG RIGHT Right 08/10/2019   IR THROMBECTOMY AV FISTULA W/THROMBOLYSIS/PTA/STENT INC/SHUNT/IMG RT Right 08/08/2018   IR THROMBECTOMY AV FISTULA W/THROMBOLYSIS/PTA/STENT INC/SHUNT/IMG RT Right 05/15/2019   IR US GUIDE VASC ACCESS RIGHT  08/08/2018   IR US GUIDE VASC ACCESS RIGHT  12/26/2018   IR US GUIDE VASC ACCESS RIGHT  05/15/2019   IR US  GUIDE VASC ACCESS RIGHT  08/10/2019   IR US GUIDE VASC ACCESS RIGHT  10/09/2019   PARS PLANA VITRECTOMY Left 11/24/2014   Procedure: PARS PLANA VITRECTOMY WITH 25 GAUGE;  Surgeon: Hurman Horn, MD;  Location: [place];  Service: Ophthalmology;  Laterality: Left;   PERIPHERAL VASCULAR BALLOON ANGIOPLASTY Right 03/05/2018   Procedure: PERIPHERAL VASCULAR BALLOON ANGIOPLASTY;  Surgeon: Waynetta Sandy, MD;  Location: [place] [facility];  Service: Cardiovascular;  Laterality: Right;  arm fistula   PERIPHERAL VASCULAR CATHETERIZATION N/A 04/28/2015   Procedure: Bilateral Upper Extremity Venography;  Surgeon: Conrad [place], MD;  Location: [place] [facility];  Service: Cardiovascular;  Laterality: N/A;   PHOTOCOAGULATION WITH LASER Left 11/24/2014   Procedure: PHOTOCOAGULATION WITH LASER;  Surgeon: Hurman Horn, MD;  Location: [place];  Service: Ophthalmology;  Laterality: Left;  with insertion of silicone oil   SAVORY DILATION N/A 10/02/2016   Procedure: SAVORY DILATION;  Surgeon: Danie Binder, MD;  [facility];  Service: Endoscopy;  Laterality: N/A;   TUBAL LIGATION     UPPER EXTREMITY VENOGRAPHY Bilateral 11/02/2019   Procedure: UPPER EXTREMITY VENOGRAPHY;  Surgeon: Waynetta Sandy, MD;  Location: [place] [facility];  Service: Cardiovascular;  Laterality: Bilateral;    Prior to Admission medications   Medication Sig Start Date End Date Taking? Authorizing Provider  acetaminophen (TYLENOL) 500 MG tablet Take 1,000 mg by mouth every 6 (six) hours as needed for moderate pain or headache.   Yes [provider]  carvedilol (COREG) 12.5 MG tablet Take 1 tablet (12.5 mg total) by mouth 2 (two) times daily. 10/07/20  Yes Emokpae, Courage, MD  COMBIGAN 0.2-0.5 % ophthalmic solution INSTILL ONE DROP IN [facility] EYE TWICE DAILY Patient taking differently: Place 1 drop into both eyes at bedtime. 05/17/20  Yes Rankin, Clent Demark, MD  fluticasone (FLONASE) 50 MCG/ACT nasal spray Place 2  sprays into both nostrils daily as needed for allergies or rhinitis. 10/14/20  Yes [provider]  gabapentin (NEURONTIN) 100 MG capsule Take 1 capsule (100 mg total) by mouth 3 (three) times daily. (Needs to be seen before next refill) 11/25/19  Yes Dettinger, Fransisca Kaufmann, MD  hydrALAZINE (APRESOLINE) 50 MG tablet Take 1 tablet (50 mg total) by mouth 2 (two) times daily. 10/07/20  Yes Emokpae, Courage, MD  LANTUS SOLOSTAR 100 UNIT/ML Solostar Pen INJECT 18-50 UNITS SUBCUTANEOUSLY AT BEDTIME. Patient taking differently: Inject 20 Units into the skin at bedtime. 10/01/19  Yes Dettinger, Fransisca Kaufmann, MD  lidocaine-prilocaine (EMLA) cream Apply 1 application topically Every Tuesday,Thursday,and Saturday with dialysis.   Yes [provider]  linagliptin (TRADJENTA) 5 MG TABS tablet Take 1 tablet (5 mg total) by mouth daily. Patient taking differently: Take 5 mg by mouth every evening. 03/18/19  Yes Dettinger, Fransisca Kaufmann, MD  multivitamin (RENA-VIT) TABS tablet Take 1 tablet by mouth daily. 04/03/19  Yes [provider]  sertraline (ZOLOFT)  100 MG tablet Take 1.5 tablets (150 mg total) by mouth daily. Patient taking differently: Take 150 mg by mouth at bedtime. 12/04/20  Yes [name], MD  sevelamer carbonate (RENVELA) 800 MG tablet Take 800-2,400 mg by mouth See admin instructions. Take 2400 mg by mouth three times daily with means and 800 mg with snacks   Yes [provider]  SYMBICORT 160-4.5 MCG/ACT inhaler Inhale 2 puffs into the lungs 2 (two) times daily. 10/07/20  Yes Emokpae, Courage, MD  ARIPiprazole (ABILIFY) 5 MG tablet Take 1 tablet (5 mg total) by mouth daily. 03/16/21 06/14/21  [name], MD  azelastine (ASTELIN) 0.1 % nasal spray Place 1 spray into both nostrils 2 (two) times daily. Use in each nostril as directed 08/29/18   Dettinger, Fransisca Kaufmann, MD  Continuous Blood Gluc Sensor (FREESTYLE LIBRE 14 DAY SENSOR) MISC Inject 1 each into the skin every 14 (fourteen) days.  Use as directed. 11/27/17   Cassandria Anger, MD  cyclobenzaprine (FLEXERIL) 10 MG tablet Take 10 mg by mouth 2 (two) times daily as needed for muscle spasms. 02/25/21   [provider]  cyclobenzaprine (FLEXERIL) 5 MG tablet TAKE ONE TABLET BY MOUTH THREE TIMES DAILY AS NEEDED FOR MUSCLE SPASM Patient not taking: No sig reported 12/18/19   Dettinger, Fransisca Kaufmann, MD  dexamethasone (DECADRON) 4 MG tablet Take 1 tablet (4 mg total) by mouth daily. 01/01/21   Orson Eva, MD  doxycycline (VIBRA-TABS) 100 MG tablet Take 100 mg by mouth 2 (two) times daily. 02/28/21   [provider]  fluconazole (DIFLUCAN) 100 MG tablet Take 1 tablet (100 mg total) by mouth daily. Patient not taking: No sig reported 01/02/21   Tat, Shanon Brow, MD  glucose blood (PRODIGY NO CODING BLOOD GLUC) test strip USE TO CHECK BLOOD SUGAR TWICE DAILY. Dx E11.22 02/03/19   Dettinger, Fransisca Kaufmann, MD  Insulin Pen Needle (TRUEPLUS PEN NEEDLES) 31G X 5 MM MISC Use daily with insulin Dx E11.22 03/09/19   Dettinger, Fransisca Kaufmann, MD  pantoprazole (PROTONIX) 40 MG tablet Take 40 mg by mouth daily. 02/21/21   [provider]  polyethylene glycol-electrolytes (TRILYTE) 420 g solution Take 4,000 mLs by mouth as directed. 01/04/21   Hurshel Keys K, DO  PRODIGY TWIST TOP LANCETS 28G MISC USE TO CHECK BLOOD SUGAR UP TO FOUR TIMES DAILY. 03/13/17   Timmothy Euler, MD  VENTOLIN HFA 108 (90 Base) MCG/ACT inhaler Inhale 2 puffs into the lungs every 4 (four) hours as needed for wheezing or shortness of breath. 10/07/20   Roxan Hockey, MD  zolpidem (AMBIEN) 5 MG tablet Take 1 tablet (5 mg total) by mouth at bedtime as needed for sleep. 02/06/21 05/07/21  [name], MD    Allergies as of 01/04/2021 - Review Complete 01/02/2021  Allergen Reaction Noted   Sulfamethoxazole-trimethoprim Nausea And Vomiting 07/13/2015   Bactrim [sulfamethoxazole-trimethoprim] Nausea And Vomiting 05/21/2013   Prednisone Other (See Comments) 07/09/2013     Family History  Problem Relation Age of Onset   COPD Mother    Cancer Father    Lymphoma Father    Diabetes Sister    Deep vein thrombosis Sister    Diabetes Brother    Hyperlipidemia Brother    Hypertension Brother    Mental retardation Sister    Alcohol abuse Paternal Grandmother    Colon cancer Neg Hx    Liver disease Neg Hx     Social History   Socioeconomic History   Marital status: Single  Spouse name: Not on file   Number of children: 5   Years of education: GED   Highest education level: Not on file  Occupational History   Not on file  Tobacco Use   Smoking status: Every Day    Packs/day: 1.00    Years: 23.00    Pack years: 23.00    Types: Cigarettes    Start date: 12/03/2000   Smokeless tobacco: Never   Tobacco comments:    one pack daily  Vaping Use   Vaping Use: Never used  Substance and Sexual Activity   Alcohol use: No    Alcohol/week: 0.0 standard drinks   Drug use: No    Comment: Sober for 8 years   Sexual activity: Not Currently    Birth control/protection: Surgical  Other Topics Concern   Not on file  Social History Narrative   Not on file   Social Determinants of Health   Financial Resource Strain: Not on file  Food Insecurity: Not on file  Transportation Needs: Not on file  Physical Activity: Not on file  Stress: Not on file  Social Connections: Not on file  Intimate Partner Violence: Not on file    Review of Systems: See HPI, otherwise negative ROS  Physical Exam: Vital signs in last 24 hours:     General:   Alert,  Well-developed, well-nourished, pleasant and cooperative in NAD Head:  Normocephalic and atraumatic. Eyes:  Sclera clear, no icterus.   Conjunctiva pink. Ears:  Normal auditory acuity. Nose:  No deformity, discharge,  or lesions. Mouth:  No deformity or lesions, dentition normal. Neck:  Supple; no masses or thyromegaly. Lungs:  Clear throughout to auscultation.   No wheezes, crackles, or rhonchi. No acute  distress. Heart:  Regular rate and rhythm; no murmurs, clicks, rubs,  or gallops. Abdomen:  Soft, nontender and nondistended. No masses, hepatosplenomegaly or hernias noted. Normal bowel sounds, without guarding, and without rebound.   Msk:  Symmetrical without gross deformities. Normal posture. Extremities:  Without clubbing or edema. Neurologic:  Alert and  oriented x4;  grossly normal neurologically. Skin:  Intact without significant lesions or rashes. Cervical Nodes:  No significant cervical adenopathy. Psych:  Alert and cooperative. Normal mood and affect.  Impression/Plan: Kara Knapp is here for a colonoscopy to be performed for iron deficiency anemia.   The risks of the procedure including infection, bleed, or perforation as well as benefits, limitations, alternatives and imponderables have been reviewed with the patient. Questions have been answered. All parties agreeable.

## 2021-03-10 NOTE — Op Note (Signed)
[facility] Patient Name: Kara Knapp Procedure Date: 03/10/2021 11:15 AM MRN: [id number] Date of Birth: [birth year] Attending MD: Elon Alas. Abbey Chatters DO CSN: [id number] Age: 50 Admit Type: Outpatient Procedure:                Colonoscopy Indications:              Iron deficiency anemia Providers:                Elon Alas. Abbey Chatters, DO, Jessica Boudreaux, [place]                            Page, [place] Risa Grill, Technician, Nelma Rothman, Merchant navy officer Referring MD:              Medicines:                See the Anesthesia note for documentation of the                            administered medications Complications:            No immediate complications. Estimated Blood Loss:     Estimated blood loss was minimal. Procedure:                Pre-Anesthesia Assessment:                           - The anesthesia plan was to use monitored                            anesthesia care (MAC).                           After obtaining informed consent, the colonoscope                            was passed under direct vision. Throughout the                            procedure, the patient's blood pressure, pulse, and                            oxygen saturations were monitored continuously. The                            PCF-HQ190L ([id number]) scope was introduced through                            the anus and advanced to the the cecum, identified                            by appendiceal orifice and ileocecal valve. The                            colonoscopy was performed without difficulty. The  patient tolerated the procedure well. The quality                            of the bowel preparation was evaluated using the                            BBPS [facility] Bowel Preparation Scale) with scores                            of: Right Colon = 3, Transverse Colon = 3 and Left                            Colon = 3 (entire mucosa seen well  with no residual                            staining, small fragments of stool or opaque                            liquid). The total BBPS score equals 9. Scope In: 11:57:33 AM Scope Out: 53:64:68 PM Scope Withdrawal Time: 0 hours 7 minutes 54 seconds  Total Procedure Duration: 0 hours 10 minutes 13 seconds  Findings:      The perianal and digital rectal examinations were normal.      Non-bleeding internal hemorrhoids were found during endoscopy.      A 5 mm polyp was found in the descending colon. The polyp was sessile.       The polyp was removed with a cold snare. Resection and retrieval were       complete.      The exam was otherwise without abnormality. Impression:               - Non-bleeding internal hemorrhoids.                           - One 5 mm polyp in the descending colon, removed                            with a cold snare. Resected and retrieved.                           - The examination was otherwise normal. Moderate Sedation:      Per Anesthesia Care Recommendation:           - Patient has a contact number available for                            emergencies. The signs and symptoms of potential                            delayed complications were discussed with the                            patient. Return to normal activities tomorrow.  Written discharge instructions were provided to the                            patient.                           - Resume previous diet.                           - Continue present medications.                           - Await pathology results.                           - Repeat colonoscopy in 5 years for surveillance.                           - Return to GI clinic in 3 months. Procedure Code(s):        --- Professional ---                           [phone], Colonoscopy, flexible; with removal of                            tumor(s), polyp(s), or other lesion(s) by snare                             technique Diagnosis Code(s):        --- Professional ---                           K63.5, Polyp of colon                           K64.8, Other hemorrhoids                           D50.9, Iron deficiency anemia, unspecified CPT copyright 2019 American Medical Association. All rights reserved. The codes documented in this report are preliminary and upon coder review may  be revised to meet current compliance requirements. Elon Alas. Abbey Chatters, DO [place] Abbey Chatters, DO 03/10/2021 12:12:16 PM This report has been signed electronically. Number of Addenda: 0

## 2021-03-10 NOTE — Discharge Instructions (Addendum)
  Colonoscopy Discharge Instructions  Read the instructions outlined below and refer to this sheet in the next few weeks. These discharge instructions provide you with general information on caring for yourself after you leave the hospital. Your doctor may also give you specific instructions. While your treatment has been planned according to the most current medical practices available, unavoidable complications occasionally occur.   ACTIVITY You may resume your regular activity, but move at a slower pace for the next 24 hours.  Take frequent rest periods for the next 24 hours.  Walking will help get rid of the air and reduce the bloated feeling in your belly (abdomen).  No driving for 24 hours (because of the medicine (anesthesia) used during the test).   Do not sign any important legal documents or operate any machinery for 24 hours (because of the anesthesia used during the test).  NUTRITION Drink plenty of fluids.  You may resume your normal diet as instructed by your doctor.  Begin with a light meal and progress to your normal diet. Heavy or fried foods are harder to digest and may make you feel sick to your stomach (nauseated).  Avoid alcoholic beverages for 24 hours or as instructed.  MEDICATIONS You may resume your normal medications unless your doctor tells you otherwise.  WHAT YOU CAN EXPECT TODAY Some feelings of bloating in the abdomen.  Passage of more gas than usual.  Spotting of blood in your stool or on the toilet paper.  IF YOU HAD POLYPS REMOVED DURING THE COLONOSCOPY: No aspirin products for 7 days or as instructed.  No alcohol for 7 days or as instructed.  Eat a soft diet for the next 24 hours.  FINDING OUT THE RESULTS OF YOUR TEST Not all test results are available during your visit. If your test results are not back during the visit, make an appointment with your caregiver to find out the results. Do not assume everything is normal if you have not heard from your  caregiver or the medical facility. It is important for you to follow up on all of your test results.  SEEK IMMEDIATE MEDICAL ATTENTION IF: You have more than a spotting of blood in your stool.  Your belly is swollen (abdominal distention).  You are nauseated or vomiting.  You have a temperature over 101.  You have abdominal pain or discomfort that is severe or gets worse throughout the day.   Your colonoscopy revealed 1 polyp(s) which I removed successfully. Await pathology results, my office will contact you. I recommend repeating colonoscopy in 5 years for surveillance purposes. Follow up with GI in 3 months. We may consider capsule endoscopy for completeness.    I hope you have a great rest of your week!  Elon Alas. Abbey Chatters, D.O. Gastroenterology and Hepatology [facility]

## 2021-03-11 DIAGNOSIS — N186 End stage renal disease: Secondary | ICD-10-CM | POA: Diagnosis not present

## 2021-03-11 DIAGNOSIS — Z7689 Persons encountering health services in other specified circumstances: Secondary | ICD-10-CM | POA: Diagnosis not present

## 2021-03-11 DIAGNOSIS — Z992 Dependence on renal dialysis: Secondary | ICD-10-CM | POA: Diagnosis not present

## 2021-03-13 ENCOUNTER — Inpatient Hospital Stay (HOSPITAL_COMMUNITY): Payer: Medicare Other

## 2021-03-13 ENCOUNTER — Inpatient Hospital Stay (HOSPITAL_COMMUNITY): Payer: Medicare Other | Attending: Physician Assistant | Admitting: Physician Assistant

## 2021-03-13 LAB — SURGICAL PATHOLOGY

## 2021-03-14 DIAGNOSIS — Z7689 Persons encountering health services in other specified circumstances: Secondary | ICD-10-CM | POA: Diagnosis not present

## 2021-03-14 DIAGNOSIS — N186 End stage renal disease: Secondary | ICD-10-CM | POA: Diagnosis not present

## 2021-03-14 DIAGNOSIS — Z992 Dependence on renal dialysis: Secondary | ICD-10-CM | POA: Diagnosis not present

## 2021-03-15 ENCOUNTER — Other Ambulatory Visit: Payer: Self-pay

## 2021-03-15 ENCOUNTER — Ambulatory Visit (HOSPITAL_COMMUNITY)
Admission: RE | Admit: 2021-03-15 | Discharge: 2021-03-15 | Disposition: A | Discharge status: Home / Self Care | Payer: Medicare Other | Attending: Vascular Surgery | Admitting: Vascular Surgery

## 2021-03-15 ENCOUNTER — Encounter (HOSPITAL_COMMUNITY)
Admission: RE | Disposition: A | Discharge status: Home / Self Care | Payer: Self-pay | Source: Home / Self Care | Attending: Vascular Surgery

## 2021-03-15 ENCOUNTER — Encounter (HOSPITAL_COMMUNITY): Payer: Self-pay | Admitting: Vascular Surgery

## 2021-03-15 DIAGNOSIS — I5032 Chronic diastolic (congestive) heart failure: Secondary | ICD-10-CM | POA: Diagnosis not present

## 2021-03-15 DIAGNOSIS — Z888 Allergy status to other drugs, medicaments and biological substances status: Secondary | ICD-10-CM | POA: Diagnosis not present

## 2021-03-15 DIAGNOSIS — I132 Hypertensive heart and chronic kidney disease with heart failure and with stage 5 chronic kidney disease, or end stage renal disease: Secondary | ICD-10-CM | POA: Diagnosis not present

## 2021-03-15 DIAGNOSIS — E1122 Type 2 diabetes mellitus with diabetic chronic kidney disease: Secondary | ICD-10-CM | POA: Diagnosis not present

## 2021-03-15 DIAGNOSIS — Z794 Long term (current) use of insulin: Secondary | ICD-10-CM | POA: Diagnosis not present

## 2021-03-15 DIAGNOSIS — Y841 Kidney dialysis as the cause of abnormal reaction of the patient, or of later complication, without mention of misadventure at the time of the procedure: Secondary | ICD-10-CM | POA: Insufficient documentation

## 2021-03-15 DIAGNOSIS — Z881 Allergy status to other antibiotic agents status: Secondary | ICD-10-CM | POA: Insufficient documentation

## 2021-03-15 DIAGNOSIS — T82898A Other specified complication of vascular prosthetic devices, implants and grafts, initial encounter: Secondary | ICD-10-CM | POA: Diagnosis not present

## 2021-03-15 DIAGNOSIS — Z992 Dependence on renal dialysis: Secondary | ICD-10-CM | POA: Insufficient documentation

## 2021-03-15 DIAGNOSIS — Z7689 Persons encountering health services in other specified circumstances: Secondary | ICD-10-CM | POA: Diagnosis not present

## 2021-03-15 DIAGNOSIS — Z79899 Other long term (current) drug therapy: Secondary | ICD-10-CM | POA: Insufficient documentation

## 2021-03-15 DIAGNOSIS — T82510A Breakdown (mechanical) of surgically created arteriovenous fistula, initial encounter: Secondary | ICD-10-CM | POA: Insufficient documentation

## 2021-03-15 DIAGNOSIS — N186 End stage renal disease: Secondary | ICD-10-CM | POA: Insufficient documentation

## 2021-03-15 DIAGNOSIS — F17210 Nicotine dependence, cigarettes, uncomplicated: Secondary | ICD-10-CM | POA: Insufficient documentation

## 2021-03-15 HISTORY — PX: A/V SHUNTOGRAM: CATH118297

## 2021-03-15 LAB — GLUCOSE, CAPILLARY
Glucose-Capillary: 89 mg/dL (ref 70–99)
Glucose-Capillary: 60 mg/dL — ABNORMAL LOW (ref 70–99)
Glucose-Capillary: 114 mg/dL — ABNORMAL HIGH (ref 70–99)
Glucose-Capillary: 70 mg/dL (ref 70–99)

## 2021-03-15 SURGERY — A/V SHUNTOGRAM
Anesthesia: LOCAL | Laterality: Left

## 2021-03-15 MED ORDER — DEXTROSE 50 % IV SOLN
25.0000 mL | Freq: Once | INTRAVENOUS | Status: AC
  Administered 2021-03-15: 25 mL via INTRAVENOUS

## 2021-03-15 MED ORDER — SODIUM CHLORIDE 0.9 % IV SOLN: 250.0000 mL | INTRAVENOUS | Status: DC | PRN

## 2021-03-15 MED ORDER — SODIUM CHLORIDE 0.9% FLUSH: 3.0000 mL | Freq: Two times a day (BID) | INTRAVENOUS | Status: DC

## 2021-03-15 MED ORDER — DEXTROSE 50 % IV SOLN
INTRAVENOUS | Status: AC
  Filled 2021-03-15: qty 50

## 2021-03-15 MED ORDER — IODIXANOL 320 MG/ML IV SOLN
INTRAVENOUS | Status: DC | PRN
  Administered 2021-03-15: 30 mL

## 2021-03-15 MED ORDER — LIDOCAINE HCL (PF) 1 % IJ SOLN
INTRAMUSCULAR | Status: AC
  Filled 2021-03-15: qty 30

## 2021-03-15 MED ORDER — HEPARIN (PORCINE) IN NACL 1000-0.9 UT/500ML-% IV SOLN
INTRAVENOUS | Status: DC | PRN
  Administered 2021-03-15: 500 mL

## 2021-03-15 MED ORDER — HEPARIN (PORCINE) IN NACL 1000-0.9 UT/500ML-% IV SOLN
INTRAVENOUS | Status: AC
  Filled 2021-03-15: qty 500

## 2021-03-15 MED ORDER — LIDOCAINE HCL (PF) 1 % IJ SOLN
INTRAMUSCULAR | Status: DC | PRN
  Administered 2021-03-15: 2 mL via INTRADERMAL

## 2021-03-15 MED ORDER — SODIUM CHLORIDE 0.9% FLUSH: 3.0000 mL | INTRAVENOUS | Status: DC | PRN

## 2021-03-15 SURGICAL SUPPLY — 9 items
BAG SNAP BAND KOVER 36X36 (MISCELLANEOUS) ×2 IMPLANT
COVER DOME SNAP 22 D (MISCELLANEOUS) ×2 IMPLANT
KIT MICROPUNCTURE NIT STIFF (SHEATH) ×2 IMPLANT
PROTECTION STATION PRESSURIZED (MISCELLANEOUS) ×2
SHEATH PROBE COVER 6X72 (BAG) ×4 IMPLANT
STATION PROTECTION PRESSURIZED (MISCELLANEOUS) ×1 IMPLANT
STOPCOCK MORSE 400PSI 3WAY (MISCELLANEOUS) ×2 IMPLANT
TRAY PV CATH (CUSTOM PROCEDURE TRAY) ×2 IMPLANT
TUBING CIL FLEX 10 FLL-RA (TUBING) ×2 IMPLANT

## 2021-03-15 NOTE — Interval H&P Note (Signed)
History and Physical Interval Note:  03/15/2021 7:30 AM  Kara Knapp  has presented today for surgery, with the diagnosis of instage renal.  The various methods of treatment have been discussed with the patient and family. After consideration of risks, benefits and other options for treatment, the patient has consented to  Procedure(s): A/V SHUNTOGRAM (Left) as a surgical intervention.  The patient's history has been reviewed, patient examined, no change in status, stable for surgery.  I have reviewed the patient's chart and labs.  Questions were answered to the patient's satisfaction.     Cherre Robins

## 2021-03-15 NOTE — Progress Notes (Signed)
Pt's CBG post procedure was 60. Meal and juice provided to patient. Last CBG check was 114 at time of DC.  Pt's BP has also been elevated post procedure, ranging from 301-237 systolic. Dr. Stanford Breed aware and stated pt is to take BP meds when she gets home. This information provided to pt in DC instructions.  Pt and son wish to visit someone in the hospital after being discharged. Nurse informed pt and son they would need to check in with the front desk prior to entering the hospital. Son assumes full responsibility for the pt at time of DC.

## 2021-03-15 NOTE — Op Note (Signed)
DATE OF SERVICE: 03/15/2021  PATIENT:  Kara Knapp  50 y.o. female  PRE-OPERATIVE DIAGNOSIS:  ESRD on HD via LUE AVG  POST-OPERATIVE DIAGNOSIS:  Same  PROCEDURE:   left upper extremity fistulogram  SURGEON:  Surgeon(s) and Role:    * Cherre Robins, MD - Primary  ASSISTANT: None  ANESTHESIA:   local  EBL: Minimal  BLOOD ADMINISTERED:none  DRAINS: none   LOCAL MEDICATIONS USED:  LIDOCAINE   SPECIMEN: None  COUNTS: confirmed correct.  TOURNIQUET: None  PATIENT DISPOSITION:  PACU - hemodynamically stable.   Delay start of Pharmacological VTE agent (>24hrs) due to surgical blood loss or risk of bleeding: no  INDICATION FOR PROCEDURE: [name] is a 50 y.o. female with stage renal disease dialyzing through a left upper extremity arteriovenous graft Tuesday, Thursday, Saturday.  She has had worsening pseudoaneurysm of the graft.  Pulsatility of the graft was noted in the office.  Fistulogram was requested.  After careful discussion of risks, benefits, and alternatives the patient was offered fistulogram. The patient understood and wished to proceed.  OPERATIVE FINDINGS: Arterial anastomosis unremarkable.  Large pseudoaneurysm in mid graft.  The graft drains into the basilic vein which has a blind ending immediately central to the venous anastomosis.  The graft drains into the basilic vein peripherally to a confluence with the brachial vein.  No central stenosis.  DESCRIPTION OF PROCEDURE: After identification of the patient in the pre-operative holding area, the patient was transferred to the operating room. The patient was positioned supine on the operating room table. Anesthesia was induced. The left arm was prepped and draped in standard fashion. A surgical pause was performed confirming correct patient, procedure, and operative location.  Using ultrasound guidance, micropuncture access was obtained in the proximal left graft.  A micro sheath was introduced.   Fistulogram's were performed and stations.  Findings are detailed above.  No intervention was possible endovascularly.  A figure-of-eight stitch was placed around the access site.  The micro sheath was removed.  The stitch was secured.  Hemostasis was achieved.  A sterile bandage was applied.  Upon completion of the case instrument and sharps counts were confirmed correct. The patient was transferred to the PACU in good condition. I was present for all portions of the procedure.  [name]. Stanford Breed, MD [facility] Phone Number: [phone] 03/15/2021 8:53 AM

## 2021-03-16 ENCOUNTER — Other Ambulatory Visit: Payer: Self-pay

## 2021-03-16 DIAGNOSIS — Z7689 Persons encountering health services in other specified circumstances: Secondary | ICD-10-CM | POA: Diagnosis not present

## 2021-03-16 DIAGNOSIS — N186 End stage renal disease: Secondary | ICD-10-CM | POA: Diagnosis not present

## 2021-03-16 DIAGNOSIS — Z992 Dependence on renal dialysis: Secondary | ICD-10-CM | POA: Diagnosis not present

## 2021-03-17 ENCOUNTER — Ambulatory Visit (INDEPENDENT_AMBULATORY_CARE_PROVIDER_SITE_OTHER): Payer: Medicare Other | Admitting: Clinical

## 2021-03-17 ENCOUNTER — Other Ambulatory Visit: Payer: Self-pay

## 2021-03-17 ENCOUNTER — Encounter (HOSPITAL_COMMUNITY): Payer: Self-pay | Admitting: Internal Medicine

## 2021-03-17 DIAGNOSIS — F3341 Major depressive disorder, recurrent, in partial remission: Secondary | ICD-10-CM

## 2021-03-17 NOTE — Progress Notes (Signed)
Virtual Visit via Telephone Note   I connected with Kara Knapp on 03/17/21 at 11:00 AM EDT by telephone and verified that I am speaking with the correct person using two identifiers.   Location: Patient: Home Provider: Office    I discussed the limitations of evaluation and management by telemedicine and the availability of in person appointments. The patient expressed understanding and agreed to proceed.       THERAPIST PROGRESS NOTE   Session Time: 11:00 AM-11:30AM   Participation Level: Active   Behavioral Response: CasualAlertDepressed   Type of Therapy: Individual Therapy   Treatment Goals addressed: Coping   Interventions: CBT   Summary: Kara Knapp is a 50 y.o. female who presents with Depression. The OPT therapist worked with the patient for her ongoing OPT treatment session. The OPT therapist utilized Motivational Interviewing to assist in creating therapeutic repore. The patient in the session was engaged and work in collaboration giving feedback about her triggers and symptoms over the past few weeks. The patient reviewed her upcoming health appointments and her recent numbers from dialysis The patient has added a streaming service and is planning on using it for new entertainment. The OPT therapist utilized Cognitive Behavioral Therapy through cognitive restructuring as well as worked with the patient on coping strategies to assist in management of mood and as she continues to work on her interactions with others. The patient reported the she continues to work on implementing positive thinking, self esteem, and staying active as well as being aware of and consistent in keeping her health care appointments. The patient spoke about  ongoing gains she feels she has been getting from better managing her emotions including setting boundaries with her interactions with others.   Suicidal/Homicidal: Nowithout intent/plan   Therapist Response: The OPT therapist worked with  the patient for the patients scheduled session. The patient was engaged in his session and gave feedback in relation to triggers, symptoms, and behavior responses over the past few weeks. The OPT therapist worked with the patient utilizing an in session Cognitive Behavioral Therapy exercise. The patient was responsive in the session and verbalized, " My numbers from my dialysis is better than it has ever been but I do have some medicine appointment and medical surgery coming up". (LEFT ARM ARTERIOVENOUS GORETEX GRAFT REVISION) The OPT therapist worked with the patient on implementing positive thinking and reviewing upcoming care appointments. The OPT therapist gauged the patents mood through scale measuring within the session. The patient spoke about being worried about one of her ex-boyfriends who was hospitalized and on life support, however, over the past few days he has been making a lot of progress with recovery and may get to discharge next week. The patient spoke about her grief experience with recently losing her Mother. The OPT therapist will continue treatment work with the patient in her next scheduled session   Plan: Return again in 3 weeks.   Diagnosis:      Axis I: MDD (major depressive disorder), recurrent, in partial remission                              Axis II: No diagnosis   I discussed the assessment and treatment plan with the patient. The patient was provided an opportunity to ask questions and all were answered. The patient agreed with the plan and demonstrated an understanding of the instructions.   The patient was advised to call back  or seek an in-person evaluation if the symptoms worsen or if the condition fails to improve as anticipated.   I provided 30 minutes of non-face-to-face time during this encounter.   Lennox Grumbles, LCSW   03/17/2021

## 2021-03-18 DIAGNOSIS — N186 End stage renal disease: Secondary | ICD-10-CM | POA: Diagnosis not present

## 2021-03-18 DIAGNOSIS — Z992 Dependence on renal dialysis: Secondary | ICD-10-CM | POA: Diagnosis not present

## 2021-03-18 DIAGNOSIS — Z7689 Persons encountering health services in other specified circumstances: Secondary | ICD-10-CM | POA: Diagnosis not present

## 2021-03-21 DIAGNOSIS — Z7689 Persons encountering health services in other specified circumstances: Secondary | ICD-10-CM | POA: Diagnosis not present

## 2021-03-21 DIAGNOSIS — Z992 Dependence on renal dialysis: Secondary | ICD-10-CM | POA: Diagnosis not present

## 2021-03-21 DIAGNOSIS — N186 End stage renal disease: Secondary | ICD-10-CM | POA: Diagnosis not present

## 2021-03-23 DIAGNOSIS — Z992 Dependence on renal dialysis: Secondary | ICD-10-CM | POA: Diagnosis not present

## 2021-03-23 DIAGNOSIS — Z7689 Persons encountering health services in other specified circumstances: Secondary | ICD-10-CM | POA: Diagnosis not present

## 2021-03-23 DIAGNOSIS — N186 End stage renal disease: Secondary | ICD-10-CM | POA: Diagnosis not present

## 2021-03-24 ENCOUNTER — Other Ambulatory Visit: Payer: Self-pay

## 2021-03-24 ENCOUNTER — Encounter (HOSPITAL_COMMUNITY): Payer: Self-pay | Admitting: Vascular Surgery

## 2021-03-24 NOTE — Progress Notes (Signed)
Spoke with pt for pre-op call. Pt has hx of HTN and Type 2 diabetes. Pt's last A1C was 6.3 on 01/02/21. She states her fasting blood sugar is usually between 100-120. Instructed her to take 1/2 of her regular dose of Lantus Insulin Sunday pm. She will take 10 units. Instructed pt to check her blood sugar when she gets up Monday AM. If blood sugar is 70 or below, treat with 1/2 cup of clear juice (apple or cranberry) and recheck blood sugar 15 minutes after drinking juice. If blood sugar continues to be 70 or below, notify nurse on arrival to pre-op. Pt voiced understanding.

## 2021-03-25 DIAGNOSIS — N186 End stage renal disease: Secondary | ICD-10-CM | POA: Diagnosis not present

## 2021-03-25 DIAGNOSIS — Z7689 Persons encountering health services in other specified circumstances: Secondary | ICD-10-CM | POA: Diagnosis not present

## 2021-03-25 DIAGNOSIS — Z992 Dependence on renal dialysis: Secondary | ICD-10-CM | POA: Diagnosis not present

## 2021-03-26 DIAGNOSIS — J439 Emphysema, unspecified: Secondary | ICD-10-CM | POA: Diagnosis not present

## 2021-03-26 NOTE — Anesthesia Preprocedure Evaluation (Addendum)
Anesthesia Evaluation  Patient identified by MRN, date of birth, ID band Patient awake    Reviewed: Allergy & Precautions, NPO status , Patient's Chart, lab work & pertinent test results  History of Anesthesia Complications Negative for: history of anesthetic complications  Airway Mallampati: II  TM Distance: >3 FB Neck ROM: Full    Dental  (+) Edentulous Upper, Edentulous Lower   Pulmonary COPD,  COPD inhaler and oxygen dependent, Current Smoker and Patient abstained from smoking.,    breath sounds clear to auscultation       Cardiovascular hypertension, Pt. on medications and Pt. on home beta blockers (-) angina+CHF   Rhythm:Regular Rate:Normal  '18 ECHO: EF 55-60%, normal LVF, grade 2 DD, mild MR, mod TR   Neuro/Psych Anxiety Depression Glaucoma: L eye blind    GI/Hepatic Neg liver ROS, GERD  Medicated and Controlled,  Endo/Other  diabetes (glu 114), Insulin Dependent, Oral Hypoglycemic Agents  Renal/GU ESRF and DialysisRenal disease (K+ 4.3)     Musculoskeletal   Abdominal (+) + obese,   Peds  Hematology negative hematology ROS (+)   Anesthesia Other Findings   Reproductive/Obstetrics                            Anesthesia Physical Anesthesia Plan  ASA: 3  Anesthesia Plan: MAC and Regional   Post-op Pain Management:    Induction:   PONV Risk Score and Plan: 1 and Ondansetron  Airway Management Planned: Natural Airway and Simple Face Mask  Additional Equipment: None  Intra-op Plan:   Post-operative Plan:   Informed Consent: I have reviewed the patients History and Physical, chart, labs and discussed the procedure including the risks, benefits and alternatives for the proposed anesthesia with the patient or authorized representative who has indicated his/her understanding and acceptance.       Plan Discussed with: CRNA and Surgeon  Anesthesia Plan Comments: (Plan  routine monitors, interscalene block)       Anesthesia Quick Evaluation

## 2021-03-27 ENCOUNTER — Other Ambulatory Visit: Payer: Self-pay

## 2021-03-27 ENCOUNTER — Encounter (HOSPITAL_COMMUNITY)
Admission: RE | Disposition: A | Discharge status: Home / Self Care | Payer: Self-pay | Source: Home / Self Care | Attending: Vascular Surgery

## 2021-03-27 ENCOUNTER — Encounter (HOSPITAL_COMMUNITY): Payer: Self-pay | Admitting: Vascular Surgery

## 2021-03-27 ENCOUNTER — Ambulatory Visit (HOSPITAL_COMMUNITY): Payer: Medicare Other

## 2021-03-27 ENCOUNTER — Ambulatory Visit (HOSPITAL_COMMUNITY): Payer: Medicare Other | Admitting: Anesthesiology

## 2021-03-27 ENCOUNTER — Ambulatory Visit (HOSPITAL_COMMUNITY)
Admission: RE | Admit: 2021-03-27 | Discharge: 2021-03-27 | Disposition: A | Discharge status: Home / Self Care | Payer: Medicare Other | Attending: Vascular Surgery | Admitting: Vascular Surgery

## 2021-03-27 DIAGNOSIS — M6281 Muscle weakness (generalized): Secondary | ICD-10-CM | POA: Insufficient documentation

## 2021-03-27 DIAGNOSIS — E1122 Type 2 diabetes mellitus with diabetic chronic kidney disease: Secondary | ICD-10-CM | POA: Insufficient documentation

## 2021-03-27 DIAGNOSIS — R200 Anesthesia of skin: Secondary | ICD-10-CM | POA: Diagnosis not present

## 2021-03-27 DIAGNOSIS — Z95828 Presence of other vascular implants and grafts: Secondary | ICD-10-CM

## 2021-03-27 DIAGNOSIS — E11319 Type 2 diabetes mellitus with unspecified diabetic retinopathy without macular edema: Secondary | ICD-10-CM | POA: Diagnosis not present

## 2021-03-27 DIAGNOSIS — Z810 Family history of intellectual disabilities: Secondary | ICD-10-CM | POA: Insufficient documentation

## 2021-03-27 DIAGNOSIS — T82868A Thrombosis of vascular prosthetic devices, implants and grafts, initial encounter: Secondary | ICD-10-CM | POA: Insufficient documentation

## 2021-03-27 DIAGNOSIS — N186 End stage renal disease: Secondary | ICD-10-CM

## 2021-03-27 DIAGNOSIS — Z794 Long term (current) use of insulin: Secondary | ICD-10-CM | POA: Insufficient documentation

## 2021-03-27 DIAGNOSIS — T82898A Other specified complication of vascular prosthetic devices, implants and grafts, initial encounter: Secondary | ICD-10-CM | POA: Diagnosis not present

## 2021-03-27 DIAGNOSIS — Z8249 Family history of ischemic heart disease and other diseases of the circulatory system: Secondary | ICD-10-CM | POA: Insufficient documentation

## 2021-03-27 DIAGNOSIS — Z888 Allergy status to other drugs, medicaments and biological substances status: Secondary | ICD-10-CM | POA: Diagnosis not present

## 2021-03-27 DIAGNOSIS — Z809 Family history of malignant neoplasm, unspecified: Secondary | ICD-10-CM | POA: Diagnosis not present

## 2021-03-27 DIAGNOSIS — I721 Aneurysm of artery of upper extremity: Secondary | ICD-10-CM | POA: Insufficient documentation

## 2021-03-27 DIAGNOSIS — I517 Cardiomegaly: Secondary | ICD-10-CM | POA: Diagnosis not present

## 2021-03-27 DIAGNOSIS — Z8349 Family history of other endocrine, nutritional and metabolic diseases: Secondary | ICD-10-CM | POA: Insufficient documentation

## 2021-03-27 DIAGNOSIS — Z825 Family history of asthma and other chronic lower respiratory diseases: Secondary | ICD-10-CM | POA: Insufficient documentation

## 2021-03-27 DIAGNOSIS — I5032 Chronic diastolic (congestive) heart failure: Secondary | ICD-10-CM | POA: Insufficient documentation

## 2021-03-27 DIAGNOSIS — Z992 Dependence on renal dialysis: Secondary | ICD-10-CM | POA: Diagnosis not present

## 2021-03-27 DIAGNOSIS — Z419 Encounter for procedure for purposes other than remedying health state, unspecified: Secondary | ICD-10-CM

## 2021-03-27 DIAGNOSIS — Z452 Encounter for adjustment and management of vascular access device: Secondary | ICD-10-CM | POA: Diagnosis not present

## 2021-03-27 DIAGNOSIS — Z881 Allergy status to other antibiotic agents status: Secondary | ICD-10-CM | POA: Insufficient documentation

## 2021-03-27 DIAGNOSIS — I132 Hypertensive heart and chronic kidney disease with heart failure and with stage 5 chronic kidney disease, or end stage renal disease: Secondary | ICD-10-CM | POA: Insufficient documentation

## 2021-03-27 DIAGNOSIS — F17210 Nicotine dependence, cigarettes, uncomplicated: Secondary | ICD-10-CM | POA: Diagnosis not present

## 2021-03-27 DIAGNOSIS — Z833 Family history of diabetes mellitus: Secondary | ICD-10-CM | POA: Diagnosis not present

## 2021-03-27 DIAGNOSIS — X58XXXA Exposure to other specified factors, initial encounter: Secondary | ICD-10-CM | POA: Insufficient documentation

## 2021-03-27 HISTORY — PX: REVISION OF ARTERIOVENOUS GORETEX GRAFT: SHX6073

## 2021-03-27 HISTORY — DX: Fatty (change of) liver, not elsewhere classified: K76.0

## 2021-03-27 HISTORY — PX: EXCISION OF MESH: SHX6268

## 2021-03-27 HISTORY — PX: INSERTION OF DIALYSIS CATHETER: SHX1324

## 2021-03-27 LAB — POCT I-STAT, CHEM 8
BUN: 22 mg/dL — ABNORMAL HIGH (ref 6–20)
Calcium, Ion: 1.18 mmol/L (ref 1.15–1.40)
Chloride: 99 mmol/L (ref 98–111)
Creatinine, Ser: 6 mg/dL — ABNORMAL HIGH (ref 0.44–1.00)
Glucose, Bld: 103 mg/dL — ABNORMAL HIGH (ref 70–99)
HCT: 34 % — ABNORMAL LOW (ref 36.0–46.0)
Hemoglobin: 11.6 g/dL — ABNORMAL LOW (ref 12.0–15.0)
Potassium: 4.3 mmol/L (ref 3.5–5.1)
Sodium: 137 mmol/L (ref 135–145)
TCO2: 30 mmol/L (ref 22–32)

## 2021-03-27 LAB — SURGICAL PCR SCREEN
MRSA, PCR: POSITIVE — AB
Staphylococcus aureus: POSITIVE — AB

## 2021-03-27 LAB — GLUCOSE, CAPILLARY
Glucose-Capillary: 114 mg/dL — ABNORMAL HIGH (ref 70–99)
Glucose-Capillary: 118 mg/dL — ABNORMAL HIGH (ref 70–99)

## 2021-03-27 SURGERY — REVISION OF ARTERIOVENOUS GORETEX GRAFT
Anesthesia: Monitor Anesthesia Care | Site: Chest | Laterality: Left

## 2021-03-27 MED ORDER — SODIUM CHLORIDE 0.9 % IV SOLN
INTRAVENOUS | Status: DC | PRN
  Administered 2021-03-27: 30 ug/min via INTRAVENOUS

## 2021-03-27 MED ORDER — CEFAZOLIN SODIUM-DEXTROSE 2-4 GM/100ML-% IV SOLN
2.0000 g | INTRAVENOUS | Status: AC
  Administered 2021-03-27: 2 g via INTRAVENOUS
  Filled 2021-03-27: qty 100

## 2021-03-27 MED ORDER — SODIUM CHLORIDE 0.9 % IV SOLN: INTRAVENOUS | Status: DC

## 2021-03-27 MED ORDER — ALBUTEROL SULFATE (2.5 MG/3ML) 0.083% IN NEBU
INHALATION_SOLUTION | RESPIRATORY_TRACT | Status: AC
  Filled 2021-03-27: qty 3

## 2021-03-27 MED ORDER — CHLORHEXIDINE GLUCONATE 4 % EX LIQD: 60.0000 mL | Freq: Once | CUTANEOUS | Status: DC

## 2021-03-27 MED ORDER — MIDAZOLAM HCL 2 MG/2ML IJ SOLN: 0.5000 mg | Freq: Once | INTRAMUSCULAR | Status: DC | PRN

## 2021-03-27 MED ORDER — PROPOFOL 1000 MG/100ML IV EMUL
INTRAVENOUS | Status: AC
  Filled 2021-03-27: qty 100

## 2021-03-27 MED ORDER — FENTANYL CITRATE (PF) 100 MCG/2ML IJ SOLN: 25.0000 ug | INTRAMUSCULAR | Status: DC | PRN

## 2021-03-27 MED ORDER — PROMETHAZINE HCL 25 MG/ML IJ SOLN: 6.2500 mg | INTRAMUSCULAR | Status: DC | PRN

## 2021-03-27 MED ORDER — LIDOCAINE-EPINEPHRINE (PF) 1 %-1:200000 IJ SOLN
INTRAMUSCULAR | Status: AC
  Filled 2021-03-27: qty 30

## 2021-03-27 MED ORDER — HEPARIN 6000 UNIT IRRIGATION SOLUTION
Status: AC
  Filled 2021-03-27: qty 500

## 2021-03-27 MED ORDER — CHLORHEXIDINE GLUCONATE 0.12 % MT SOLN
OROMUCOSAL | Status: AC
  Administered 2021-03-27: 15 mL
  Filled 2021-03-27: qty 15

## 2021-03-27 MED ORDER — OXYCODONE-ACETAMINOPHEN 5-325 MG PO TABS: 1.0000 | ORAL_TABLET | Freq: Four times a day (QID) | ORAL | 0 refills | Status: DC | PRN

## 2021-03-27 MED ORDER — FENTANYL CITRATE (PF) 100 MCG/2ML IJ SOLN
INTRAMUSCULAR | Status: AC
  Filled 2021-03-27: qty 2

## 2021-03-27 MED ORDER — ONDANSETRON HCL 4 MG/2ML IJ SOLN
INTRAMUSCULAR | Status: AC
  Filled 2021-03-27: qty 2

## 2021-03-27 MED ORDER — HEPARIN 6000 UNIT IRRIGATION SOLUTION
Status: DC | PRN
  Administered 2021-03-27: 1

## 2021-03-27 MED ORDER — OXYCODONE HCL 5 MG PO TABS: 5.0000 mg | ORAL_TABLET | Freq: Once | ORAL | Status: DC | PRN

## 2021-03-27 MED ORDER — HEPARIN SODIUM (PORCINE) 1000 UNIT/ML IJ SOLN
INTRAMUSCULAR | Status: DC | PRN
  Administered 2021-03-27: 3200 [IU]

## 2021-03-27 MED ORDER — ALBUTEROL SULFATE (2.5 MG/3ML) 0.083% IN NEBU
2.5000 mg | INHALATION_SOLUTION | Freq: Once | RESPIRATORY_TRACT | Status: AC
  Administered 2021-03-27: 2.5 mg via RESPIRATORY_TRACT

## 2021-03-27 MED ORDER — OXYCODONE HCL 5 MG/5ML PO SOLN: 5.0000 mg | Freq: Once | ORAL | Status: DC | PRN

## 2021-03-27 MED ORDER — MIDAZOLAM HCL 2 MG/2ML IJ SOLN
INTRAMUSCULAR | Status: AC
  Filled 2021-03-27: qty 2

## 2021-03-27 MED ORDER — HEPARIN SODIUM (PORCINE) 1000 UNIT/ML IJ SOLN
INTRAMUSCULAR | Status: AC
  Filled 2021-03-27: qty 1

## 2021-03-27 MED ORDER — 0.9 % SODIUM CHLORIDE (POUR BTL) OPTIME
TOPICAL | Status: DC | PRN
  Administered 2021-03-27: 1000 mL

## 2021-03-27 MED ORDER — FENTANYL CITRATE (PF) 100 MCG/2ML IJ SOLN
INTRAMUSCULAR | Status: DC | PRN
  Administered 2021-03-27: 50 ug via INTRAVENOUS
  Administered 2021-03-27 (×2): 25 ug via INTRAVENOUS

## 2021-03-27 MED ORDER — HEPARIN SODIUM (PORCINE) 1000 UNIT/ML IJ SOLN
INTRAMUSCULAR | Status: DC | PRN
  Administered 2021-03-27: 5000 [IU] via INTRAVENOUS

## 2021-03-27 MED ORDER — LIDOCAINE-EPINEPHRINE (PF) 1 %-1:200000 IJ SOLN
INTRAMUSCULAR | Status: DC | PRN
  Administered 2021-03-27: 16 mL

## 2021-03-27 MED ORDER — PROPOFOL 500 MG/50ML IV EMUL
INTRAVENOUS | Status: DC | PRN
  Administered 2021-03-27: 100 ug/kg/min via INTRAVENOUS

## 2021-03-27 MED ORDER — MIDAZOLAM HCL 5 MG/5ML IJ SOLN
INTRAMUSCULAR | Status: DC | PRN
  Administered 2021-03-27 (×2): 1 mg via INTRAVENOUS

## 2021-03-27 MED ORDER — ONDANSETRON HCL 4 MG/2ML IJ SOLN
INTRAMUSCULAR | Status: DC | PRN
  Administered 2021-03-27: 4 mg via INTRAVENOUS

## 2021-03-27 MED ORDER — LIDOCAINE-EPINEPHRINE (PF) 1.5 %-1:200000 IJ SOLN
INTRAMUSCULAR | Status: DC | PRN
  Administered 2021-03-27: 25 mL via PERINEURAL

## 2021-03-27 SURGICAL SUPPLY — 48 items
ADH SKN CLS APL DERMABOND .7 (GAUZE/BANDAGES/DRESSINGS) ×9
APL PRP STRL LF DISP 70% ISPRP (MISCELLANEOUS) ×6
APL SKNCLS STERI-STRIP NONHPOA (GAUZE/BANDAGES/DRESSINGS) ×3
ARMBAND PINK RESTRICT EXTREMIT (MISCELLANEOUS) ×4 IMPLANT
BAG COUNTER SPONGE SURGICOUNT (BAG) IMPLANT
BAG SPNG CNTER NS LX DISP (BAG)
BENZOIN TINCTURE PRP APPL 2/3 (GAUZE/BANDAGES/DRESSINGS) ×4 IMPLANT
BIOPATCH RED 1 DISK 7.0 (GAUZE/BANDAGES/DRESSINGS) ×4 IMPLANT
CANISTER SUCT 3000ML PPV (MISCELLANEOUS) ×4 IMPLANT
CATH EMB 4FR 40CM (CATHETERS) ×4 IMPLANT
CATH PALINDROME-P 19CM W/VT (CATHETERS) ×4 IMPLANT
CHLORAPREP W/TINT 26 (MISCELLANEOUS) ×8 IMPLANT
CLIP VESOCCLUDE MED 6/CT (CLIP) ×4 IMPLANT
CLIP VESOCCLUDE SM WIDE 6/CT (CLIP) ×4 IMPLANT
COVER PROBE W GEL 5X96 (DRAPES) ×8 IMPLANT
DERMABOND ADVANCED (GAUZE/BANDAGES/DRESSINGS) ×3
DERMABOND ADVANCED .7 DNX12 (GAUZE/BANDAGES/DRESSINGS) ×9 IMPLANT
DRAPE ORTHO SPLIT 77X108 STRL (DRAPES) ×8
DRAPE SURG ORHT 6 SPLT 77X108 (DRAPES) ×6 IMPLANT
ELECT REM PT RETURN 9FT ADLT (ELECTROSURGICAL) ×4
ELECTRODE REM PT RTRN 9FT ADLT (ELECTROSURGICAL) ×3 IMPLANT
GAUZE 4X4 16PLY ~~LOC~~+RFID DBL (SPONGE) ×4 IMPLANT
GLOVE SURG POLYISO LF SZ8 (GLOVE) ×4 IMPLANT
GOWN STRL REUS W/ TWL LRG LVL3 (GOWN DISPOSABLE) ×6 IMPLANT
GOWN STRL REUS W/ TWL XL LVL3 (GOWN DISPOSABLE) ×3 IMPLANT
GOWN STRL REUS W/TWL LRG LVL3 (GOWN DISPOSABLE) ×8
GOWN STRL REUS W/TWL XL LVL3 (GOWN DISPOSABLE) ×4
GRAFT GORETEX STRETCH 6X40 (Vascular Products) ×4 IMPLANT
INSERT FOGARTY SM (MISCELLANEOUS) ×4 IMPLANT
KIT BASIN OR (CUSTOM PROCEDURE TRAY) ×4 IMPLANT
KIT MICROPUNCTURE NIT STIFF (SHEATH) ×4 IMPLANT
KIT TURNOVER KIT B (KITS) ×4 IMPLANT
NS IRRIG 1000ML POUR BTL (IV SOLUTION) ×4 IMPLANT
PACK CV ACCESS (CUSTOM PROCEDURE TRAY) ×4 IMPLANT
PAD ARMBOARD 7.5X6 YLW CONV (MISCELLANEOUS) ×8 IMPLANT
SPONGE T-LAP 18X18 ~~LOC~~+RFID (SPONGE) ×4 IMPLANT
STOCKINETTE 6  STRL (DRAPES) ×4
STOCKINETTE 6 STRL (DRAPES) ×3 IMPLANT
STRIP CLOSURE SKIN 1/2X4 (GAUZE/BANDAGES/DRESSINGS) IMPLANT
SUT GORETEX 6.0 TT9 (SUTURE) IMPLANT
SUT MNCRL AB 4-0 PS2 18 (SUTURE) ×12 IMPLANT
SUT PROLENE 5 0 C 1 24 (SUTURE) ×16 IMPLANT
SUT PROLENE 6 0 BV (SUTURE) ×12 IMPLANT
SUT VIC AB 3-0 SH 27 (SUTURE) ×8
SUT VIC AB 3-0 SH 27X BRD (SUTURE) ×6 IMPLANT
TOWEL GREEN STERILE (TOWEL DISPOSABLE) ×4 IMPLANT
UNDERPAD 30X36 HEAVY ABSORB (UNDERPADS AND DIAPERS) ×4 IMPLANT
WATER STERILE IRR 1000ML POUR (IV SOLUTION) ×4 IMPLANT

## 2021-03-27 NOTE — Anesthesia Procedure Notes (Signed)
Anesthesia Regional Block: Interscalene brachial plexus block   Pre-Anesthetic Checklist: , timeout performed,  Correct Patient, Correct Site, Correct Laterality,  Correct Procedure, Correct Position, site marked,  Risks and benefits discussed,  Surgical consent,  Pre-op evaluation,  At surgeon's request and post-op pain management  Laterality: Left and Lower  Prep: chloraprep       Needles:  Injection technique: Single-shot  Needle Type: Echogenic Needle     Needle Length: 9cm  Needle Gauge: 21     Additional Needles:   Procedures:,,,, ultrasound used (permanent image in chart),,    Narrative:  Start time: 03/27/2021 7:09 AM End time: 03/27/2021 7:16 AM Injection made incrementally with aspirations every 5 mL.  Performed by: Personally  Anesthesiologist: Annye Asa, MD  Additional Notes: Pt identified in Holding room.  Monitors applied. Working IV access confirmed. Sterile prep L clavicle and neck.  #21ga ECHOgenic Arrow block needle to interscalene brachial plexus with US guidance.  25cc 1.5% Lidocaine with 1:200k epi (MPF) injected incrementally after negative test dose.  Patient asymptomatic, VSS, no heme aspirated, tolerated well.   Jenita Seashore, MD

## 2021-03-27 NOTE — Anesthesia Procedure Notes (Signed)
Procedure Name: MAC Date/Time: 03/27/2021 7:52 AM Performed by: Annamary Carolin, CRNA Pre-anesthesia Checklist: Patient identified, Emergency Drugs available, Suction available and Patient being monitored Patient Re-evaluated:Patient Re-evaluated prior to induction Preoxygenation: Pre-oxygenation with 100% oxygen Induction Type: IV induction

## 2021-03-27 NOTE — Transfer of Care (Signed)
Immediate Anesthesia Transfer of Care Note  Patient: Kara Knapp  Procedure(s) Performed: LEFT ARM ARTERIOVENOUS GORETEX GRAFT PLACEMENT (Left) ULTRASOUND GUIDED INSERTION OF TUNNELED DIALYSIS CATHETER (Chest) EXCISION OF  ARTERIOVENOUS GRAFT PSUEDOANUERYSM (Left: Arm Upper)  Patient Location: PACU  Anesthesia Type:MAC and Regional  Level of Consciousness: drowsy  Airway & Oxygen Therapy: Patient Spontanous Breathing and Patient connected to face mask oxygen  Post-op Assessment: Report given to RN, Post -op Vital signs reviewed and stable and Patient moving all extremities X 4  Post vital signs: Reviewed and stable  Last Vitals:  Vitals Value Taken Time  BP 139/68 03/27/21 1027  Temp    Pulse 51 03/27/21 1027  Resp 17 03/27/21 1027  SpO2 100 % 03/27/21 1027  Vitals shown include unvalidated device data.  Last Pain:  Vitals:   03/27/21 0646  TempSrc:   PainSc: 0-No pain         Complications: No notable events documented.

## 2021-03-27 NOTE — H&P (Signed)
Office Note     CC:  follow up Requesting Provider:  No ref. provider found  HPI: Kara Knapp is a 50 y.o. (Aug 10, 1970) female who presents for evaluation of pseudoaneurysm of left arm AV graft.  Left upper arm AV graft was placed in April 2021.  She was referred back to our office by nephrology PA due to noticeable enlarging pseudoaneurysm of left arm.  She is still able to complete HD treatments from left arm AV graft.  She is dialyzing on a Tuesday Thursday Saturday schedule at [facility] in [place].  Prior access includes right arm AV graft   Past Medical History:  Diagnosis Date   Anemia of chronic disease    Anxiety    Asthma    Blind left eye    Bronchitis    Cataract    Cholecystitis, acute 05/26/2013   Status post cholecystectomy   Chronic abdominal pain    Chronic diarrhea    COPD (chronic obstructive pulmonary disease) (HCC)    Depression    Diabetic foot ulcer ([place]) 05/15/5101   Diastolic heart failure ([place])    ESRD on hemodialysis ([place])    Started diaylsis 12/29/15   Essential hypertension    Fatty liver    Fibroids    GERD (gastroesophageal reflux disease)    Glaucoma    History of blood transfusion    History of pneumonia    Hyperlipidemia    Insulin-dependent diabetes mellitus with retinopathy    Liver fibrosis    Negative Hep B surface antigen, negative Hep C antibody Feb 2018 (see scanned in labs).   Neuropathy    Osteomyelitis (HCC)    Toe on left foot    Past Surgical History:  Procedure Laterality Date   A/V SHUNTOGRAM N/A 10/25/2016   Procedure: A/V Shuntogram - Right Arm;  Surgeon: Waynetta Sandy, MD;  Location: [place] [facility];  Service: Cardiovascular;  Laterality: N/A;   A/V SHUNTOGRAM N/A 03/05/2018   Procedure: A/V SHUNTOGRAM - Right Arm;  Surgeon: Waynetta Sandy, MD;  Location: [place] [facility];  Service: Cardiovascular;  Laterality: N/A;   A/V SHUNTOGRAM Left 03/15/2021   Procedure: A/V SHUNTOGRAM;   Surgeon: Cherre Robins, MD;  Location: [place] [facility];  Service: Cardiovascular;  Laterality: Left;   AV FISTULA PLACEMENT Right 10/17/2015   Procedure: INSERTION OF ARTERIOVENOUS GORE-TEX GRAFT RIGHT UPPER ARM WITH ACUSEAL;  Surgeon: Conrad Sasser, MD;  Location: [place];  Service: Vascular;  Laterality: Right;   AV FISTULA PLACEMENT Left 11/18/2019   Procedure: INSERTION OF ARTERIOVENOUS (AV) GORE-TEX GRAFT left  ARM;  Surgeon: Serafina Mitchell, MD;  Location: [place];  Service: Vascular;  Laterality: Left;   BIOPSY  01/02/2021   Procedure: BIOPSY;  Surgeon: Eloise Harman, DO;  [facility];  Service: Endoscopy;;   CATARACT EXTRACTION W/ INTRAOCULAR LENS IMPLANT Bilateral    CESAREAN SECTION     CHOLECYSTECTOMY N/A 05/25/2013   Procedure: LAPAROSCOPIC CHOLECYSTECTOMY;  Surgeon: Jamesetta So, MD;  Location: [facility];  Service: General;  Laterality: N/A;   COLONOSCOPY WITH PROPOFOL N/A 10/02/2016   two 3 to 5 mm polyps in the descending colon, three 2 to 3 mm polyps in the rectum, random colon biopsies, rectal bleeding due to internal hemorrhoids, friability with no bleeding at the anus status post biopsy.  Surgical pathology found the polyps to be a mix of hyperplastic and tubular adenoma, random colon biopsies to be benign colonic  mucosa, and the anal biopsies to be anal skin tag.    COLONOSCOPY WITH PROPOFOL N/A 03/10/2021   Procedure: COLONOSCOPY WITH PROPOFOL;  Surgeon: Eloise Harman, DO;  [facility];  Service: Endoscopy;  Laterality: N/A;  11:30am, pt is on dialysis - pt can not come earlier, riding [facility]   ESOPHAGOGASTRODUODENOSCOPY (EGD) WITH PROPOFOL N/A 10/02/2016   mucosal nodule in the esophagus status post biopsy, moderate gastritis status post biopsy, mild duodenitis. esophageal biopsy to be benign, gastric biopsies to be gastritis due to aspirin use, and duodenal biopsies to be duodenitis due to aspirin use   ESOPHAGOGASTRODUODENOSCOPY (EGD) WITH PROPOFOL N/A  01/02/2021   Procedure: ESOPHAGOGASTRODUODENOSCOPY (EGD) WITH PROPOFOL;  Surgeon: Eloise Harman, DO;  [facility];  Service: Endoscopy;  Laterality: N/A;   EYE SURGERY Bilateral    IR FLUORO GUIDE CV LINE RIGHT  10/09/2019   IR REMOVAL TUN CV CATH W/O FL  01/15/2020   IR THROMBECTOMY AV FISTULA W/THROMBOLYSIS/PTA INC/SHUNT/IMG RIGHT Right 12/26/2018   IR THROMBECTOMY AV FISTULA W/THROMBOLYSIS/PTA INC/SHUNT/IMG RIGHT Right 08/10/2019   IR THROMBECTOMY AV FISTULA W/THROMBOLYSIS/PTA/STENT INC/SHUNT/IMG RT Right 08/08/2018   IR THROMBECTOMY AV FISTULA W/THROMBOLYSIS/PTA/STENT INC/SHUNT/IMG RT Right 05/15/2019   IR US GUIDE VASC ACCESS RIGHT  08/08/2018   IR US GUIDE VASC ACCESS RIGHT  12/26/2018   IR US GUIDE VASC ACCESS RIGHT  05/15/2019   IR US GUIDE VASC ACCESS RIGHT  08/10/2019   IR US GUIDE VASC ACCESS RIGHT  10/09/2019   PARS PLANA VITRECTOMY Left 11/24/2014   Procedure: PARS PLANA VITRECTOMY WITH 25 GAUGE;  Surgeon: Hurman Horn, MD;  Location: [place];  Service: Ophthalmology;  Laterality: Left;   PERIPHERAL VASCULAR BALLOON ANGIOPLASTY Right 03/05/2018   Procedure: PERIPHERAL VASCULAR BALLOON ANGIOPLASTY;  Surgeon: Waynetta Sandy, MD;  Location: [place] [facility];  Service: Cardiovascular;  Laterality: Right;  arm fistula   PERIPHERAL VASCULAR CATHETERIZATION N/A 04/28/2015   Procedure: Bilateral Upper Extremity Venography;  Surgeon: Conrad [place], MD;  Location: [place] [facility];  Service: Cardiovascular;  Laterality: N/A;   PHOTOCOAGULATION WITH LASER Left 11/24/2014   Procedure: PHOTOCOAGULATION WITH LASER;  Surgeon: Hurman Horn, MD;  Location: [place];  Service: Ophthalmology;  Laterality: Left;  with insertion of silicone oil   POLYPECTOMY  03/10/2021   Procedure: POLYPECTOMY INTESTINAL;  Surgeon: Eloise Harman, DO;  [facility];  Service: Endoscopy;;   SAVORY DILATION N/A 10/02/2016   Procedure: SAVORY DILATION;  Surgeon: Danie Binder, MD;  [facility];  Service: Endoscopy;  Laterality: N/A;   TUBAL LIGATION     UPPER EXTREMITY VENOGRAPHY Bilateral 11/02/2019   Procedure: UPPER EXTREMITY VENOGRAPHY;  Surgeon: Waynetta Sandy, MD;  Location: [place] [facility];  Service: Cardiovascular;  Laterality: Bilateral;    Social History   Socioeconomic History   Marital status: Single    Spouse name: Not on file   Number of children: 5   Years of education: GED   Highest education level: Not on file  Occupational History   Not on file  Tobacco Use   Smoking status: Every Day    Packs/day: 1.00    Years: 23.00    Pack years: 23.00    Types: Cigarettes    Start date: 12/03/2000   Smokeless tobacco: Never   Tobacco comments:    one pack daily  Vaping Use   Vaping Use: Never used  Substance and Sexual Activity  Alcohol use: No    Alcohol/week: 0.0 standard drinks   Drug use: No    Comment: Sober for 8 years   Sexual activity: Not Currently    Birth control/protection: Surgical  Other Topics Concern   Not on file  Social History Narrative   Not on file   Social Determinants of Health   Financial Resource Strain: Not on file  Food Insecurity: Not on file  Transportation Needs: Not on file  Physical Activity: Not on file  Stress: Not on file  Social Connections: Not on file  Intimate Partner Violence: Not on file    Family History  Problem Relation Age of Onset   COPD Mother    Cancer Father    Lymphoma Father    Diabetes Sister    Deep vein thrombosis Sister    Diabetes Brother    Hyperlipidemia Brother    Hypertension Brother    Mental retardation Sister    Alcohol abuse Paternal Grandmother    Colon cancer Neg Hx    Liver disease Neg Hx     Current Facility-Administered Medications  Medication Dose Route Frequency Provider Last Rate Last Admin   0.9 %  sodium chloride infusion   Intravenous Continuous Cherre Robins, MD       ceFAZolin (ANCEF) IVPB 2g/100 mL premix  2 g Intravenous 30 min  Pre-Op Cherre Robins, MD       chlorhexidine (HIBICLENS) 4 % liquid 4 application  60 mL Topical Once Cherre Robins, MD       And   [START ON 03/28/2021] chlorhexidine (HIBICLENS) 4 % liquid 4 application  60 mL Topical Once Cherre Robins, MD       fentaNYL (SUBLIMAZE) 100 MCG/2ML injection            midazolam (VERSED) 2 MG/2ML injection             Allergies  Allergen Reactions   Bactrim [Sulfamethoxazole-Trimethoprim] Nausea And Vomiting   Prednisone Other (See Comments)    "I was wide open and couldn't eat" per pt. Loss of appetite and insomnia  LOSS OF APPETITE,UNABLE TO SLEEP     REVIEW OF SYSTEMS:   [X]  denotes positive finding, [ ]  denotes negative finding Cardiac  Comments:  Chest pain or chest pressure:    Shortness of breath upon exertion:    Short of breath when lying flat:    Irregular heart rhythm:        Vascular    Pain in calf, thigh, or hip brought on by ambulation:    Pain in feet at night that wakes you up from your sleep:     Blood clot in your veins:    Leg swelling:         Pulmonary    Oxygen at home:    Productive cough:     Wheezing:         Neurologic    Sudden weakness in arms or legs:     Sudden numbness in arms or legs:     Sudden onset of difficulty speaking or slurred speech:    Temporary loss of vision in one eye:     Problems with dizziness:         Gastrointestinal    Blood in stool:     Vomited blood:         Genitourinary    Burning when urinating:     Blood in urine:  Psychiatric    Major depression:         Hematologic    Bleeding problems:    Problems with blood clotting too easily:        Skin    Rashes or ulcers:        Constitutional    Fever or chills:      PHYSICAL EXAMINATION:  Vitals:   03/27/21 0607  BP: (!) 189/70  Pulse: 62  Resp: 18  Temp: 97.7 F (36.5 C)  TempSrc: Oral  SpO2: 95%  Weight: 79.8 kg  Height: 5' 3"  (1.6 m)    General:  WDWN in NAD; vital signs documented  above Gait: Not observed HENT: WNL, normocephalic Pulmonary: normal non-labored breathing , without Rales, rhonchi,  wheezing Cardiac: regular HR Abdomen: soft, NT, no masses Skin: without rashes Vascular Exam/Pulses:  Right Left  Radial 2+ (normal) 2+ (normal)   Extremities: Small focal pseudoaneurysm in the mid left upper arm AV graft without any skin compromise; easily palpable thrill near the arterial anastomosis however flow is more pulsatile as he moved towards the axilla Musculoskeletal: no muscle wasting or atrophy  Neurologic: A&O X 3;  No focal weakness or paresthesias are detected Psychiatric:  The pt has Normal affect.    ASSESSMENT/PLAN:: 50 y.o. female here for follow up for evaluation of left arm AV graft pseudoaneurysm. Fistulagram reveals indirect outflow of the graft. Plan revision in the OR today.  [name]. Stanford Breed, MD [facility] Phone Number: [phone] 03/27/2021 7:16 AM

## 2021-03-27 NOTE — Anesthesia Postprocedure Evaluation (Signed)
Anesthesia Post Note  Patient: Kara Knapp  Procedure(s) Performed: LEFT ARM ARTERIOVENOUS GORETEX GRAFT PLACEMENT (Left) ULTRASOUND GUIDED INSERTION OF TUNNELED DIALYSIS CATHETER (Chest) EXCISION OF  ARTERIOVENOUS GRAFT PSUEDOANUERYSM (Left: Arm Upper)     Patient location during evaluation: PACU Anesthesia Type: Regional Level of consciousness: awake and alert, patient cooperative and oriented Pain management: pain level controlled Vital Signs Assessment: post-procedure vital signs reviewed and stable Respiratory status: spontaneous breathing, nonlabored ventilation, respiratory function stable and patient connected to nasal cannula oxygen Cardiovascular status: blood pressure returned to baseline and stable Postop Assessment: no apparent nausea or vomiting, adequate PO intake and able to ambulate Anesthetic complications: no   No notable events documented.  Last Vitals:  Vitals:   03/27/21 1115 03/27/21 1130  BP: (!) 178/88 (!) 169/77  Pulse: (!) 58 (!) 59  Resp: 20 18  Temp:  36.7 C  SpO2: 100% 99%    Last Pain:  Vitals:   03/27/21 1130  TempSrc:   PainSc: 0-No pain                 Jayveion Stalling,E. Karine Garn

## 2021-03-27 NOTE — Progress Notes (Addendum)
Orthopedic Tech Progress Note Patient Details:  Kara Knapp 04-06-1971 [id number] PACU RN called requesting an ARM SLING for patient. Stated "she would apply when she got patient dressed"    Ortho Devices Type of Ortho Device: Arm sling Ortho Device/Splint Location: LUE Ortho Device/Splint Interventions: Adjustment   Post Interventions Patient Tolerated: Well Instructions Provided: [facility] [place] 03/27/2021, 10:48 AM

## 2021-03-27 NOTE — Op Note (Addendum)
DATE OF SERVICE: 03/27/2021  PATIENT:  Kara Knapp  50 y.o. female  PRE-OPERATIVE DIAGNOSIS:  ESRD; LUE with pseudoaneurysm and disadvantaged outflow  POST-OPERATIVE DIAGNOSIS:  Same  PROCEDURE:   1) Tunneled dialysis catheter placement (19cm RIJ) 2) Left upper extremity arteriovenous graft pseudoaneurysm excision 3) Creation of new left upper extremity arteriovenous graft (38m PTFE)  SURGEON:  Surgeon(s) and Role:    * HCherre Robins MD - Primary  ASSISTANT: CDelena Serve RNFA  An assistant was required to facilitate exposure and expedite the case.  ANESTHESIA:   local, regional, and MAC  EBL: 1039m BLOOD ADMINISTERED:none  DRAINS: none   LOCAL MEDICATIONS USED:  LIDOCAINE   SPECIMEN:  none  COUNTS: confirmed correct.  TOURNIQUET:  none  PATIENT DISPOSITION:  PACU - hemodynamically stable.   Delay start of Pharmacological VTE agent (>24hrs) due to surgical blood loss or risk of bleeding: no  INDICATION FOR PROCEDURE: Kara Knapp is a [age].o. female with left upper extremity AV graft with pseudoaneurysmal degeneration.  A fistulogram was performed which showed disadvantaged outflow. After careful discussion of risks, benefits, and alternatives the patient was offered tunneled dialysis catheter placement, left upper extremity AV graft revision. The patient understood and wished to proceed.  OPERATIVE FINDINGS: Unremarkable tunneled dialysis catheter placement.  Excised midportion of graft which had total degeneration.  Tunneled new graft lateral to the previous graft.  Inflow from existing graft.  Outflow to the brachial vein.  Good Doppler flow at completion in the central vein.  DESCRIPTION OF PROCEDURE: After identification of the patient in the pre-operative holding area, the patient was transferred to the operating room. The patient was positioned supine on the operating room table. Anesthesia was induced. The and left arm were prepped and draped in  standard fashion. A surgical pause was performed confirming correct patient, procedure, and operative location.  Using ultrasound guidance the right internal jugular vein was accessed with micropuncture technique.  Through the micropuncture sheath a floppy J-wire was advanced into the superior vena cava.  A small incision was made around the skin access point.  The access point was serially dilated under direct fluoroscopic guidance.  A peel-away sheath was introduced into the superior vena cava under fluoroscopic guidance.  A counterincision was made in the chest under the clavicle.  A 23 cm tunnel dialysis catheter was then tunneled under the skin, over the clavicle into the incision in the neck.  The tunneling device was removed and the catheter fed through the peel-away sheath into the superior vena cava.  The peel-away sheath was removed and the catheter gently pulled back.  Adequate position was confirmed with x-ray.  The catheter was tested and found to flush and draw back well.  Catheter was heparin locked.  Caps were applied.  Catheter was sutured to the skin.  The neck incision was closed with 4-0 Monocryl.  A longitudinal incision was made over the course of the mid graft.  The inflow was exposed sharply and controlled with a Fogarty Hydro grip.  The outflow was exposed sharply and controlled with a profunda clamp.  The pseudoaneurysm was entered sharply.  Chronic thrombus was evacuated.  The outflow was oversewn with two 2-0 silk sutures.  The pseudoaneurysmal portion of the graft was totally excised.    Using ultrasound guidance, we identified the brachial vein in the axilla.  An longitudinal incision proximal to the previous AV graft incision was made and carried down through subcutaneous tissue until the  brachial vein was identified.  This was encircled with Silastic Vesseloops.  Great care was taken to avoid injury to brachial sheath structures.  New 6 mm Gore-Tex graft was tunneled lateral  to the previous graft.  The inflow was sewn end-to-end with continuous running suture of 5-0 Prolene.  The brachial vein was clamped with a Cooley clamp and a long venotomy made with 11 blade and Potts scissors.  The distal end of the graft was spatulated with heavy Mayo scissors and sewn end-to-side to the brachial vein using continuous running suture of 6-0 Prolene.  The graft did need to be thrombectomized in the distal anastomosis revised.  After this there was good Doppler flow at the outflow.    Hemostasis was achieved in the anastomoses in the surgical beds.  The wounds were closed in layers using 3-0 Vicryl and 4-0 Monocryl.  Dermabond was applied.  Upon completion of the case instrument and sharps counts were confirmed correct. The patient was transferred to the PACU in good condition. I was present for all portions of the procedure.  [name]. Stanford Breed, MD [facility] Phone Number: [phone] 03/27/2021 10:18 AM

## 2021-03-27 NOTE — Discharge Instructions (Signed)
   [facility]  Discharge Instructions  AV Fistula or Graft Surgery for Dialysis Access  Please refer to the following instructions for your post-procedure care. Your surgeon or physician assistant will discuss any changes with you.  Activity  You may drive the day following your surgery, if you are comfortable and no longer taking prescription pain medication. Resume full activity as the soreness in your incision resolves.  Bathing/Showering  You may shower after you go home. Keep your incision dry for 48 hours. Do not soak in a bathtub, hot tub, or swim until the incision heals completely. You may not shower if you have a hemodialysis catheter.  Incision Care  Clean your incision with mild soap and water after 48 hours. Pat the area dry with a clean towel. You do not need a bandage unless otherwise instructed. Do not apply any ointments or creams to your incision. You may have skin glue on your incision. Do not peel it off. It will come off on its own in about one week. Your arm may swell a bit after surgery. To reduce swelling use pillows to elevate your arm so it is above your heart. Your doctor will tell you if you need to lightly wrap your arm with an ACE bandage.  Diet  Resume your normal diet. There are not special food restrictions following this procedure. In order to heal from your surgery, it is CRITICAL to get adequate nutrition. Your body requires vitamins, minerals, and protein. Vegetables are the best source of vitamins and minerals. Vegetables also provide the perfect balance of protein. Processed food has little nutritional value, so try to avoid this.  Medications  Resume taking all of your medications. If your incision is causing pain, you may take over-the counter pain relievers such as acetaminophen (Tylenol). If you were prescribed a stronger pain medication, please be aware these medications can cause nausea and constipation. Prevent  nausea by taking the medication with a snack or meal. Avoid constipation by drinking plenty of fluids and eating foods with high amount of fiber, such as fruits, vegetables, and grains.  Do not take Tylenol if you are taking prescription pain medications.  Follow up Your surgeon may want to see you in the office following your access surgery. If so, this will be arranged at the time of your surgery.  Please call us immediately for any of the following conditions:  Increased pain, redness, drainage (pus) from your incision site Fever of 101 degrees or higher Severe or worsening pain at your incision site Hand pain or numbness.  Reduce your risk of vascular disease:  Stop smoking. If you would like help, call [facility] at 1-800-QUIT-NOW 718-170-2813) or [place] at [place] your cholesterol Maintain a desired weight Control your diabetes Keep your blood pressure down  Dialysis  It will take several weeks to several months for your new dialysis access to be ready for use. Your surgeon will determine when it is okay to use it. Your nephrologist will continue to direct your dialysis. You can continue to use your Permcath until your new access is ready for use.   03/27/2021 Kara Knapp [id number] 05-Dec-1970  Surgeon(s): Cherre Robins, MD  Procedure(s): LEFT ARM ARTERIOVENOUS GORETEX GRAFT REVISION ULTRASOUND GUIDED INSERTION OF TUNNELED DIALYSIS CATHETER  x Do not stick fistula for 6 weeks    If you have any questions, please call the office at [phone].

## 2021-03-28 ENCOUNTER — Telehealth: Payer: Self-pay | Admitting: *Deleted

## 2021-03-28 ENCOUNTER — Encounter (HOSPITAL_COMMUNITY): Payer: Self-pay | Admitting: Vascular Surgery

## 2021-03-28 NOTE — Telephone Encounter (Signed)
Pts aid called to report sleepyness after surgery yesterday. She is easy to arouse and is not having any trouble breathing. Advised that sleepyness after surgery is normal and it should get better over the next day or so. Advised aid to take pt to the ED if pt becomes difficult to arouse or develops breathing trouble. Aid verbalized understanding.

## 2021-03-29 ENCOUNTER — Ambulatory Visit: Payer: Medicare Other | Admitting: Gastroenterology

## 2021-03-29 ENCOUNTER — Encounter: Payer: Self-pay | Admitting: Internal Medicine

## 2021-03-29 DIAGNOSIS — Z992 Dependence on renal dialysis: Secondary | ICD-10-CM | POA: Diagnosis not present

## 2021-03-29 DIAGNOSIS — N186 End stage renal disease: Secondary | ICD-10-CM | POA: Diagnosis not present

## 2021-03-30 DIAGNOSIS — Z992 Dependence on renal dialysis: Secondary | ICD-10-CM | POA: Diagnosis not present

## 2021-03-30 DIAGNOSIS — N186 End stage renal disease: Secondary | ICD-10-CM | POA: Diagnosis not present

## 2021-04-01 DIAGNOSIS — Z992 Dependence on renal dialysis: Secondary | ICD-10-CM | POA: Diagnosis not present

## 2021-04-01 DIAGNOSIS — N186 End stage renal disease: Secondary | ICD-10-CM | POA: Diagnosis not present

## 2021-04-04 DIAGNOSIS — N186 End stage renal disease: Secondary | ICD-10-CM | POA: Diagnosis not present

## 2021-04-04 DIAGNOSIS — Z992 Dependence on renal dialysis: Secondary | ICD-10-CM | POA: Diagnosis not present

## 2021-04-06 DIAGNOSIS — Z992 Dependence on renal dialysis: Secondary | ICD-10-CM | POA: Diagnosis not present

## 2021-04-06 DIAGNOSIS — N186 End stage renal disease: Secondary | ICD-10-CM | POA: Diagnosis not present

## 2021-04-07 ENCOUNTER — Ambulatory Visit: Payer: Self-pay

## 2021-04-07 NOTE — Telephone Encounter (Signed)
Returned pt's call.  Pt is experiencing numbness on left arm from elbow to her hand after surgery 03/27/2021.  Pt tried to call surgeon's office and was on hold hold for 30+ minutes, when pt disconnected.  Accessed pt's discharge which states that she should call the office immediately if numbness occurs.   Called her surgeon's office and spoke to service. Service states she will put in an emergency call to surgeon.   Called back pt and updated. Asked pt to call me back when Dr. Nichola Sizer call of they do not return call after 1 hour or so.   Pt agrees with this plan.          Answer Assessment - Initial Assessment Questions 1. SYMPTOM: "What is the main symptom you are concerned about?" (e.g., weakness, numbness)     numbness 2. ONSET: "When did this start?" (minutes, hours, days; while sleeping)     Before surgery 3. LAST NORMAL: "When was the last time you (the patient) were normal (no symptoms)?"     Before surgery 4. PATTERN "Does this come and go, or has it been constant since it started?"  "Is it present now?"     constant 5. CARDIAC SYMPTOMS: "Have you had any of the following symptoms: chest pain, difficulty breathing, palpitations?"     no 6. NEUROLOGIC SYMPTOMS: "Have you had any of the following symptoms: headache, dizziness, vision loss, double vision, changes in speech, unsteady on your feet?"     na 7. OTHER SYMPTOMS: "Do you have any other symptoms?"     na 8. PREGNANCY: "Is there any chance you are pregnant?" "When was your last menstrual period?"     no  Protocols used: Neurologic Deficit-A-AH

## 2021-04-08 DIAGNOSIS — N186 End stage renal disease: Secondary | ICD-10-CM | POA: Diagnosis not present

## 2021-04-08 DIAGNOSIS — Z992 Dependence on renal dialysis: Secondary | ICD-10-CM | POA: Diagnosis not present

## 2021-04-11 DIAGNOSIS — Z992 Dependence on renal dialysis: Secondary | ICD-10-CM | POA: Diagnosis not present

## 2021-04-11 DIAGNOSIS — N186 End stage renal disease: Secondary | ICD-10-CM | POA: Diagnosis not present

## 2021-04-12 ENCOUNTER — Encounter (INDEPENDENT_AMBULATORY_CARE_PROVIDER_SITE_OTHER): Payer: Medicare Other | Admitting: Ophthalmology

## 2021-04-13 DIAGNOSIS — Z992 Dependence on renal dialysis: Secondary | ICD-10-CM | POA: Diagnosis not present

## 2021-04-13 DIAGNOSIS — N186 End stage renal disease: Secondary | ICD-10-CM | POA: Diagnosis not present

## 2021-04-14 ENCOUNTER — Ambulatory Visit (INDEPENDENT_AMBULATORY_CARE_PROVIDER_SITE_OTHER): Payer: Medicare Other | Admitting: Clinical

## 2021-04-14 ENCOUNTER — Other Ambulatory Visit: Payer: Self-pay

## 2021-04-14 DIAGNOSIS — F3341 Major depressive disorder, recurrent, in partial remission: Secondary | ICD-10-CM | POA: Diagnosis not present

## 2021-04-14 NOTE — Progress Notes (Signed)
Virtual Visit via Telephone Note   I connected with Kara Knapp on 04/14/21 at 11:00 AM EDT by telephone and verified that I am speaking with the correct person using two identifiers.   Location: Patient: Home Provider: Office    I discussed the limitations of evaluation and management by telemedicine and the availability of in person appointments. The patient expressed understanding and agreed to proceed.       THERAPIST PROGRESS NOTE   Session Time: 11:00 AM-11:30AM   Participation Level: Active   Behavioral Response: CasualAlertDepressed   Type of Therapy: Individual Therapy   Treatment Goals addressed: Coping   Interventions: CBT   Summary: Kara Knapp is a 50 y.o. female who presents with Depression. The OPT therapist worked with the patient for her ongoing OPT treatment session. The OPT therapist utilized Motivational Interviewing to assist in creating therapeutic repore. The patient in the session was engaged and work in collaboration giving feedback about her triggers and symptoms over the course of the past few weeks. The patient spoke about her recovery from recent medical procedure and follow up with the vascular center  Dr Lenna Gilford on Oct 19th. The patient spoke about upcoming appointment with Dr. Modesta Messing and looking to make a change with her sleep aid. The patient notes needing to reschedule with the [place] and Gastrologist. The patient spoke about her plans to apply to [address], get more control over her finances, and get her birth certificate to file for her Divorce formally.The OPT therapist utilized Cognitive Behavioral Therapy through cognitive restructuring as well as worked with the patient on coping strategies to assist in management of mood and as she continues to work on her interactions with others. The patient reported the she continues to work on implementing positive thinking, self esteem, and staying active as well as being aware of and  consistent in keeping her health care appointments. The patient spoke about  ongoing gains she feels she has been getting from better managing her emotions including setting boundaries with her interactions with others as well goals she has for housing and finances.   Suicidal/Homicidal: Nowithout intent/plan   Therapist Response: The OPT therapist worked with the patient for the patients scheduled session. The patient was engaged in his session and gave feedback in relation to triggers, symptoms, and behavior responses over the past few weeks. The OPT therapist worked with the patient utilizing an in session Cognitive Behavioral Therapy exercise. The patient was responsive in the session and verbalized, " I have been recovering from my procedure I have to have stitches".The OPT therapist worked with the patient on implementing positive thinking and reviewing upcoming care appointments. The OPT therapist gauged the patents mood through scale measuring within the session. The patient spoke about short term goals in areas of housing and finanaces, and continuing to focus on her health including decreasing her smoking. The OPT therapist continued to provide support/encouragement and worked with the patient on staying motivated and using coping skills. The OPT therapist will continue treatment work with the patient in her next scheduled session   Plan: Return again in 3 weeks.   Diagnosis:      Axis I: MDD (major depressive disorder), recurrent, in partial remission                              Axis II: No diagnosis   I discussed the assessment and treatment plan with the  patient. The patient was provided an opportunity to ask questions and all were answered. The patient agreed with the plan and demonstrated an understanding of the instructions.   The patient was advised to call back or seek an in-person evaluation if the symptoms worsen or if the condition fails to improve as anticipated.   I provided  30 minutes of non-face-to-face time during this encounter.   Lennox Grumbles, LCSW   04/14/2021

## 2021-04-15 DIAGNOSIS — Z992 Dependence on renal dialysis: Secondary | ICD-10-CM | POA: Diagnosis not present

## 2021-04-15 DIAGNOSIS — N186 End stage renal disease: Secondary | ICD-10-CM | POA: Diagnosis not present

## 2021-04-18 DIAGNOSIS — N186 End stage renal disease: Secondary | ICD-10-CM | POA: Diagnosis not present

## 2021-04-18 DIAGNOSIS — Z992 Dependence on renal dialysis: Secondary | ICD-10-CM | POA: Diagnosis not present

## 2021-04-19 ENCOUNTER — Encounter (INDEPENDENT_AMBULATORY_CARE_PROVIDER_SITE_OTHER): Payer: Medicare Other | Admitting: Ophthalmology

## 2021-04-20 DIAGNOSIS — Z299 Encounter for prophylactic measures, unspecified: Secondary | ICD-10-CM | POA: Diagnosis not present

## 2021-04-20 DIAGNOSIS — L739 Follicular disorder, unspecified: Secondary | ICD-10-CM | POA: Diagnosis not present

## 2021-04-20 DIAGNOSIS — E1165 Type 2 diabetes mellitus with hyperglycemia: Secondary | ICD-10-CM | POA: Diagnosis not present

## 2021-04-20 DIAGNOSIS — N186 End stage renal disease: Secondary | ICD-10-CM | POA: Diagnosis not present

## 2021-04-20 DIAGNOSIS — I10 Essential (primary) hypertension: Secondary | ICD-10-CM | POA: Diagnosis not present

## 2021-04-22 DIAGNOSIS — N186 End stage renal disease: Secondary | ICD-10-CM | POA: Diagnosis not present

## 2021-04-22 DIAGNOSIS — Z992 Dependence on renal dialysis: Secondary | ICD-10-CM | POA: Diagnosis not present

## 2021-04-25 DIAGNOSIS — N186 End stage renal disease: Secondary | ICD-10-CM | POA: Diagnosis not present

## 2021-04-25 DIAGNOSIS — Z992 Dependence on renal dialysis: Secondary | ICD-10-CM | POA: Diagnosis not present

## 2021-04-26 DIAGNOSIS — J439 Emphysema, unspecified: Secondary | ICD-10-CM | POA: Diagnosis not present

## 2021-04-27 DIAGNOSIS — N186 End stage renal disease: Secondary | ICD-10-CM | POA: Diagnosis not present

## 2021-04-27 DIAGNOSIS — Z992 Dependence on renal dialysis: Secondary | ICD-10-CM | POA: Diagnosis not present

## 2021-04-28 DIAGNOSIS — N186 End stage renal disease: Secondary | ICD-10-CM | POA: Diagnosis not present

## 2021-04-28 DIAGNOSIS — Z992 Dependence on renal dialysis: Secondary | ICD-10-CM | POA: Diagnosis not present

## 2021-04-29 DIAGNOSIS — Z23 Encounter for immunization: Secondary | ICD-10-CM | POA: Diagnosis not present

## 2021-04-29 DIAGNOSIS — N186 End stage renal disease: Secondary | ICD-10-CM | POA: Diagnosis not present

## 2021-04-29 DIAGNOSIS — Z992 Dependence on renal dialysis: Secondary | ICD-10-CM | POA: Diagnosis not present

## 2021-05-01 ENCOUNTER — Other Ambulatory Visit: Payer: Self-pay

## 2021-05-01 ENCOUNTER — Ambulatory Visit (INDEPENDENT_AMBULATORY_CARE_PROVIDER_SITE_OTHER): Payer: Medicare Other | Admitting: Clinical

## 2021-05-01 DIAGNOSIS — F3341 Major depressive disorder, recurrent, in partial remission: Secondary | ICD-10-CM

## 2021-05-01 NOTE — Progress Notes (Signed)
Virtual Visit via Telephone Note   I connected with Kara Knapp on 05/01/21 at 9:00 AM EDT by telephone and verified that I am speaking with the correct person using two identifiers.   Location: Patient: Home Provider: Office    I discussed the limitations of evaluation and management by telemedicine and the availability of in person appointments. The patient expressed understanding and agreed to proceed.       THERAPIST PROGRESS NOTE   Session Time: 9:00 AM-9:30AM   Participation Level: Active   Behavioral Response: CasualAlertDepressed   Type of Therapy: Individual Therapy   Treatment Goals addressed: Coping   Interventions: CBT   Summary: Kara Knapp is a 50 y.o. female who presents with Depression. The OPT therapist worked with the patient for her ongoing OPT treatment session. The OPT therapist utilized Motivational Interviewing to assist in creating therapeutic repore. The patient in the session was engaged and work in collaboration giving feedback about her triggers and symptoms over the course of the past few weeks. The patient spoke about a upcoming trip to [place] this coming Wednesday to see her daughter.The OPT therapist utilized Cognitive Behavioral Therapy through cognitive restructuring as well as worked with the patient on coping strategies to assist in management of mood and as she continues to work on her interactions with others. The patient reported the she continues to work on implementing positive thinking, self esteem, and staying active as well as being aware of and consistent in keeping her health care appointments. The patient spoke about positive changes she is having with her current medication regiment and noted awareness of her scheduled upcoming appointment with Dr. Modesta Messing.The patient in this session continued to work on disclosure around her childhood experience.   Suicidal/Homicidal: Nowithout intent/plan   Therapist Response: The OPT therapist  worked with the patient for the patients scheduled session. The patient was engaged in his session and gave feedback in relation to triggers, symptoms, and behavior responses over the past few weeks. The OPT therapist worked with the patient utilizing an in session Cognitive Behavioral Therapy exercise. The patient was responsive in the session and verbalized, " I have been feeling tired but I am always more restless on Sunday nights, but my medicine is working good now I think it just took a little while to get into my system".The OPT therapist worked with the patient on implementing positive thinking and reviewing upcoming care appointments. The OPT therapist gauged the patents mood through scale measuring within the session. The patient spoke about ongoing work on her own short term goals in areas of housing and finanaces, and continuing to focus on her health including decreasing her smoking. The patient spoke about looking forward to going to see her daughter in [place] that she has not seen in the past several months. The OPT therapist continued to provide support/encouragement and worked with the patient on staying motivated and using coping skills. The patient spoke about a recent large change for her in getting a hair cut and getting her hair cut much shorter. The patient in this session spoke about her experiences growing up and her relationship with her Mother. The OPT therapist will continue treatment work with the patient in her next scheduled session.   Plan: Return again in 3 weeks.   Diagnosis:      Axis I: MDD (major depressive disorder), recurrent, in partial remission  Axis II: No diagnosis   I discussed the assessment and treatment plan with the patient. The patient was provided an opportunity to ask questions and all were answered. The patient agreed with the plan and demonstrated an understanding of the instructions.   The patient was advised to call back  or seek an in-person evaluation if the symptoms worsen or if the condition fails to improve as anticipated.   I provided 30 minutes of non-face-to-face time during this encounter.   Lennox Grumbles, LCSW   05/01/2021

## 2021-05-02 ENCOUNTER — Other Ambulatory Visit: Payer: Self-pay

## 2021-05-02 DIAGNOSIS — N186 End stage renal disease: Secondary | ICD-10-CM | POA: Diagnosis not present

## 2021-05-02 DIAGNOSIS — Z23 Encounter for immunization: Secondary | ICD-10-CM | POA: Diagnosis not present

## 2021-05-02 DIAGNOSIS — Z992 Dependence on renal dialysis: Secondary | ICD-10-CM | POA: Diagnosis not present
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
## 2021-05-04 DIAGNOSIS — E119 Type 2 diabetes mellitus without complications: Secondary | ICD-10-CM | POA: Diagnosis not present

## 2021-05-04 DIAGNOSIS — Z794 Long term (current) use of insulin: Secondary | ICD-10-CM | POA: Diagnosis not present

## 2021-05-04 DIAGNOSIS — Z992 Dependence on renal dialysis: Secondary | ICD-10-CM | POA: Diagnosis not present

## 2021-05-04 DIAGNOSIS — Z23 Encounter for immunization: Secondary | ICD-10-CM | POA: Diagnosis not present

## 2021-05-04 DIAGNOSIS — N186 End stage renal disease: Secondary | ICD-10-CM | POA: Diagnosis not present

## 2021-05-06 DIAGNOSIS — Z23 Encounter for immunization: Secondary | ICD-10-CM | POA: Diagnosis not present

## 2021-05-06 DIAGNOSIS — Z992 Dependence on renal dialysis: Secondary | ICD-10-CM | POA: Diagnosis not present

## 2021-05-06 DIAGNOSIS — N186 End stage renal disease: Secondary | ICD-10-CM | POA: Diagnosis not present

## 2021-05-08 ENCOUNTER — Other Ambulatory Visit: Payer: Self-pay

## 2021-05-08 ENCOUNTER — Telehealth (INDEPENDENT_AMBULATORY_CARE_PROVIDER_SITE_OTHER): Payer: Medicare Other | Admitting: Psychiatry

## 2021-05-08 ENCOUNTER — Ambulatory Visit (INDEPENDENT_AMBULATORY_CARE_PROVIDER_SITE_OTHER): Payer: Medicare Other | Admitting: Ophthalmology

## 2021-05-08 ENCOUNTER — Encounter (INDEPENDENT_AMBULATORY_CARE_PROVIDER_SITE_OTHER): Payer: Self-pay | Admitting: Ophthalmology

## 2021-05-08 ENCOUNTER — Encounter: Payer: Self-pay | Admitting: Psychiatry

## 2021-05-08 DIAGNOSIS — E113511 Type 2 diabetes mellitus with proliferative diabetic retinopathy with macular edema, right eye: Secondary | ICD-10-CM | POA: Diagnosis not present

## 2021-05-08 DIAGNOSIS — H4311 Vitreous hemorrhage, right eye: Secondary | ICD-10-CM | POA: Diagnosis not present

## 2021-05-08 DIAGNOSIS — G4700 Insomnia, unspecified: Secondary | ICD-10-CM

## 2021-05-08 DIAGNOSIS — F330 Major depressive disorder, recurrent, mild: Secondary | ICD-10-CM | POA: Diagnosis not present

## 2021-05-08 DIAGNOSIS — H211X1 Other vascular disorders of iris and ciliary body, right eye: Secondary | ICD-10-CM

## 2021-05-08 MED ORDER — SERTRALINE HCL 100 MG PO TABS: 150.0000 mg | ORAL_TABLET | Freq: Every day | ORAL | 1 refills | Status: AC

## 2021-05-08 MED ORDER — ZOLPIDEM TARTRATE 5 MG PO TABS: 5.0000 mg | ORAL_TABLET | Freq: Every evening | ORAL | 2 refills | Status: DC | PRN

## 2021-05-08 MED ORDER — ARIPIPRAZOLE 5 MG PO TABS: 5.0000 mg | ORAL_TABLET | Freq: Every day | ORAL | 0 refills | Status: DC

## 2021-05-08 NOTE — Assessment & Plan Note (Signed)
Prior macular edema now resolved and atrophy accounts for acuity.  No active PDR

## 2021-05-08 NOTE — Patient Instructions (Signed)
1. Continue sertraline 150 mg daily 2. Continue Abilify 5 mg at night  3. Continue Ambien 5 mg at night  4. Next appointment: 1/11 at 1 PM

## 2021-05-08 NOTE — Progress Notes (Signed)
05/08/2021     CHIEF COMPLAINT Patient presents for  Chief Complaint  Patient presents with   Retina Follow Up    3 month fu OU /Avastin OD Pt states, "I feel like my va is worse. I feel like I am in a tunnel and it is just my OD. I am not sure how long it has been like this but it has been a couple of weeks." A1C: 6.8 LBS: 115 Combigan QHS OU      HISTORY OF PRESENT ILLNESS: Kara Knapp is a 50 y.o. female who presents to the clinic today for:   HPI     Retina Follow Up   Patient presents with  Diabetic Retinopathy.  In right eye.  This started 4 months ago.  Severity is mild.  Duration of 4 months.  Since onset it is stable. Additional comments: 3 month fu OU /Avastin OD Pt states, "I feel like my va is worse. I feel like I am in a tunnel and it is just my OD. I am not sure how long it has been like this but it has been a couple of weeks." A1C: 6.8 LBS: 115 Combigan QHS OU        Comments   4 mos fu ou oct AVASTIN OD. Patient states vision is stable and unchanged since last visit. Denies any new floaters or FOL. Pt uses combigan ou qhs. LBS: 148 this morning A1C: 6.5, 2-3 weeks ago per patient.      Last edited by Laurin Coder on 05/08/2021  2:54 PM.      Referring physician: Medicine, [facility] Internal [place],  [place] [address]  HISTORICAL INFORMATION:   Selected notes from the MEDICAL RECORD NUMBER    Lab Results  Component Value Date   HGBA1C 6.3 (H) 01/01/2021     CURRENT MEDICATIONS: Current Outpatient Medications (Ophthalmic Drugs)  Medication Sig   COMBIGAN 0.2-0.5 % ophthalmic solution INSTILL ONE DROP IN [facility] EYE TWICE DAILY (Patient taking differently: Place 1 drop into both eyes at bedtime.)   No current facility-administered medications for this visit. (Ophthalmic Drugs)   Current Outpatient Medications (Other)  Medication Sig   acetaminophen (TYLENOL) 500 MG tablet Take 1,000 mg by mouth every 6 (six) hours as needed  for moderate pain or headache.   [START ON 06/15/2021] ARIPiprazole (ABILIFY) 5 MG tablet Take 1 tablet (5 mg total) by mouth daily.   azelastine (ASTELIN) 0.1 % nasal spray Place 1 spray into both nostrils 2 (two) times daily. Use in each nostril as directed   carvedilol (COREG) 12.5 MG tablet Take 1 tablet (12.5 mg total) by mouth 2 (two) times daily.   Continuous Blood Gluc Sensor (FREESTYLE LIBRE 14 DAY SENSOR) MISC Inject 1 each into the skin every 14 (fourteen) days. Use as directed.   cyclobenzaprine (FLEXERIL) 10 MG tablet Take 10 mg by mouth 2 (two) times daily as needed for muscle spasms.   dexamethasone (DECADRON) 4 MG tablet Take 1 tablet (4 mg total) by mouth daily.   doxycycline (VIBRA-TABS) 100 MG tablet Take 100 mg by mouth 2 (two) times daily.   fluticasone (FLONASE) 50 MCG/ACT nasal spray Place 2 sprays into both nostrils daily as needed for allergies or rhinitis.   gabapentin (NEURONTIN) 100 MG capsule Take 1 capsule (100 mg total) by mouth 3 (three) times daily. (Needs to be seen before next refill)   glucose blood (PRODIGY NO CODING BLOOD GLUC) test strip USE TO CHECK  BLOOD SUGAR TWICE DAILY. Dx E11.22   hydrALAZINE (APRESOLINE) 50 MG tablet Take 1 tablet (50 mg total) by mouth 2 (two) times daily.   Insulin Pen Needle (TRUEPLUS PEN NEEDLES) 31G X 5 MM MISC Use daily with insulin Dx E11.22   LANTUS SOLOSTAR 100 UNIT/ML Solostar Pen INJECT 18-50 UNITS SUBCUTANEOUSLY AT BEDTIME. (Patient taking differently: Inject 20 Units into the skin at bedtime.)   lidocaine-prilocaine (EMLA) cream Apply 1 application topically Every Tuesday,Thursday,and Saturday with dialysis.   linagliptin (TRADJENTA) 5 MG TABS tablet Take 1 tablet (5 mg total) by mouth daily. (Patient taking differently: Take 5 mg by mouth every evening.)   multivitamin (RENA-VIT) TABS tablet Take 1 tablet by mouth daily.   oxyCODONE-acetaminophen (PERCOCET) 5-325 MG tablet Take 1 tablet by mouth every 6 (six) hours as  needed.   pantoprazole (PROTONIX) 40 MG tablet Take 40 mg by mouth daily.   polyethylene glycol-electrolytes (TRILYTE) 420 g solution Take 4,000 mLs by mouth as directed.   PRODIGY TWIST TOP LANCETS 28G MISC USE TO CHECK BLOOD SUGAR UP TO FOUR TIMES DAILY.   [START ON 06/06/2021] sertraline (ZOLOFT) 100 MG tablet Take 1.5 tablets (150 mg total) by mouth daily.   sevelamer carbonate (RENVELA) 800 MG tablet Take 800-2,400 mg by mouth See admin instructions. Take 2400 mg by mouth three times daily with means and 800 mg with snacks   SYMBICORT 160-4.5 MCG/ACT inhaler Inhale 2 puffs into the lungs 2 (two) times daily.   VENTOLIN HFA 108 (90 Base) MCG/ACT inhaler Inhale 2 puffs into the lungs every 4 (four) hours as needed for wheezing or shortness of breath.   [START ON 05/28/2021] zolpidem (AMBIEN) 5 MG tablet Take 1 tablet (5 mg total) by mouth at bedtime as needed for sleep.   No current facility-administered medications for this visit. (Other)      REVIEW OF SYSTEMS:    ALLERGIES Allergies  Allergen Reactions   Bactrim [Sulfamethoxazole-Trimethoprim] Nausea And Vomiting   Prednisone Other (See Comments)    "I was wide open and couldn't eat" per pt. Loss of appetite and insomnia  LOSS OF APPETITE,UNABLE TO SLEEP    PAST MEDICAL HISTORY Past Medical History:  Diagnosis Date   Anemia of chronic disease    Anxiety    Asthma    Blind left eye    Bronchitis    Cataract    Cholecystitis, acute 05/26/2013   Status post cholecystectomy   Chronic abdominal pain    Chronic diarrhea    COPD (chronic obstructive pulmonary disease) (HCC)    Depression    Diabetic foot ulcer ([place]) 74/06/8785   Diastolic heart failure (HCC)    ESRD on hemodialysis ([place])    Started diaylsis 12/29/15   Essential hypertension    Fatty liver    Fibroids    GERD (gastroesophageal reflux disease)    Glaucoma    History of blood transfusion    History of pneumonia    Hyperlipidemia    Insulin-dependent  diabetes mellitus with retinopathy    Liver fibrosis    Negative Hep B surface antigen, negative Hep C antibody Feb 2018 (see scanned in labs).   Neuropathy    Osteomyelitis (HCC)    Toe on left foot   Past Surgical History:  Procedure Laterality Date   A/V SHUNTOGRAM N/A 10/25/2016   Procedure: A/V Shuntogram - Right Arm;  Surgeon: Waynetta Sandy, MD;  Location: [place] [facility];  Service: Cardiovascular;  Laterality: N/A;   A/V SHUNTOGRAM  N/A 03/05/2018   Procedure: A/V SHUNTOGRAM - Right Arm;  Surgeon: Waynetta Sandy, MD;  Location: [place] [facility];  Service: Cardiovascular;  Laterality: N/A;   A/V SHUNTOGRAM Left 03/15/2021   Procedure: A/V SHUNTOGRAM;  Surgeon: Cherre Robins, MD;  Location: [place] [facility];  Service: Cardiovascular;  Laterality: Left;   AV FISTULA PLACEMENT Right 10/17/2015   Procedure: INSERTION OF ARTERIOVENOUS GORE-TEX GRAFT RIGHT UPPER ARM WITH ACUSEAL;  Surgeon: Conrad [place], MD;  Location: [place];  Service: Vascular;  Laterality: Right;   AV FISTULA PLACEMENT Left 11/18/2019   Procedure: INSERTION OF ARTERIOVENOUS (AV) GORE-TEX GRAFT left  ARM;  Surgeon: Serafina Mitchell, MD;  Location: [place];  Service: Vascular;  Laterality: Left;   BIOPSY  01/02/2021   Procedure: BIOPSY;  Surgeon: Eloise Harman, DO;  [facility];  Service: Endoscopy;;   CATARACT EXTRACTION W/ INTRAOCULAR LENS IMPLANT Bilateral    CESAREAN SECTION     CHOLECYSTECTOMY N/A 05/25/2013   Procedure: LAPAROSCOPIC CHOLECYSTECTOMY;  Surgeon: Jamesetta So, MD;  Location: [facility];  Service: General;  Laterality: N/A;   COLONOSCOPY WITH PROPOFOL N/A 10/02/2016   two 3 to 5 mm polyps in the descending colon, three 2 to 3 mm polyps in the rectum, random colon biopsies, rectal bleeding due to internal hemorrhoids, friability with no bleeding at the anus status post biopsy.  Surgical pathology found the polyps to be a mix of hyperplastic and tubular adenoma, random colon  biopsies to be benign colonic mucosa, and the anal biopsies to be anal skin tag.    COLONOSCOPY WITH PROPOFOL N/A 03/10/2021   Procedure: COLONOSCOPY WITH PROPOFOL;  Surgeon: Eloise Harman, DO;  [facility];  Service: Endoscopy;  Laterality: N/A;  11:30am, pt is on dialysis - pt can not come earlier, riding [facility]   ESOPHAGOGASTRODUODENOSCOPY (EGD) WITH PROPOFOL N/A 10/02/2016   mucosal nodule in the esophagus status post biopsy, moderate gastritis status post biopsy, mild duodenitis. esophageal biopsy to be benign, gastric biopsies to be gastritis due to aspirin use, and duodenal biopsies to be duodenitis due to aspirin use   ESOPHAGOGASTRODUODENOSCOPY (EGD) WITH PROPOFOL N/A 01/02/2021   Procedure: ESOPHAGOGASTRODUODENOSCOPY (EGD) WITH PROPOFOL;  Surgeon: Eloise Harman, DO;  [facility];  Service: Endoscopy;  Laterality: N/A;   EXCISION OF MESH Left 03/27/2021   Procedure: EXCISION OF  ARTERIOVENOUS GRAFT PSUEDOANUERYSM;  Surgeon: Cherre Robins, MD;  Location: [facility];  Service: Vascular;  Laterality: Left;   EYE SURGERY Bilateral    INSERTION OF DIALYSIS CATHETER  03/27/2021   Procedure: ULTRASOUND GUIDED INSERTION OF TUNNELED DIALYSIS CATHETER;  Surgeon: Cherre Robins, MD;  Location: [place];  Service: Vascular;;   IR FLUORO GUIDE CV LINE RIGHT  10/09/2019   IR REMOVAL TUN CV CATH W/O FL  01/15/2020   IR THROMBECTOMY AV FISTULA W/THROMBOLYSIS/PTA INC/SHUNT/IMG RIGHT Right 12/26/2018   IR THROMBECTOMY AV FISTULA W/THROMBOLYSIS/PTA INC/SHUNT/IMG RIGHT Right 08/10/2019   IR THROMBECTOMY AV FISTULA W/THROMBOLYSIS/PTA/STENT INC/SHUNT/IMG RT Right 08/08/2018   IR THROMBECTOMY AV FISTULA W/THROMBOLYSIS/PTA/STENT INC/SHUNT/IMG RT Right 05/15/2019   IR US GUIDE VASC ACCESS RIGHT  08/08/2018   IR US GUIDE VASC ACCESS RIGHT  12/26/2018   IR US GUIDE VASC ACCESS RIGHT  05/15/2019   IR US GUIDE VASC ACCESS RIGHT  08/10/2019   IR US GUIDE VASC ACCESS RIGHT  10/09/2019   PARS PLANA  VITRECTOMY Left 11/24/2014   Procedure: PARS PLANA VITRECTOMY WITH 25 GAUGE;  Surgeon: Hurman Horn, MD;  Location: [place];  Service: Ophthalmology;  Laterality: Left;   PERIPHERAL VASCULAR BALLOON ANGIOPLASTY Right 03/05/2018   Procedure: PERIPHERAL VASCULAR BALLOON ANGIOPLASTY;  Surgeon: Waynetta Sandy, MD;  Location: [place] [facility];  Service: Cardiovascular;  Laterality: Right;  arm fistula   PERIPHERAL VASCULAR CATHETERIZATION N/A 04/28/2015   Procedure: Bilateral Upper Extremity Venography;  Surgeon: Conrad Revere, MD;  Location: [place] [facility];  Service: Cardiovascular;  Laterality: N/A;   PHOTOCOAGULATION WITH LASER Left 11/24/2014   Procedure: PHOTOCOAGULATION WITH LASER;  Surgeon: Hurman Horn, MD;  Location: [place];  Service: Ophthalmology;  Laterality: Left;  with insertion of silicone oil   POLYPECTOMY  03/10/2021   Procedure: POLYPECTOMY INTESTINAL;  Surgeon: Eloise Harman, DO;  [facility];  Service: Endoscopy;;   REVISION OF ARTERIOVENOUS GORETEX GRAFT Left 03/27/2021   Procedure: LEFT ARM ARTERIOVENOUS GORETEX GRAFT PLACEMENT;  Surgeon: Cherre Robins, MD;  Location: [place];  Service: Vascular;  Laterality: Left;  PERIPHERAL NERVE BLOCK   SAVORY DILATION N/A 10/02/2016   Procedure: SAVORY DILATION;  Surgeon: Danie Binder, MD;  [facility];  Service: Endoscopy;  Laterality: N/A;   TUBAL LIGATION     UPPER EXTREMITY VENOGRAPHY Bilateral 11/02/2019   Procedure: UPPER EXTREMITY VENOGRAPHY;  Surgeon: Waynetta Sandy, MD;  Location: [place] [facility];  Service: Cardiovascular;  Laterality: Bilateral;    FAMILY HISTORY Family History  Problem Relation Age of Onset   COPD Mother    Cancer Father    Lymphoma Father    Diabetes Sister    Deep vein thrombosis Sister    Diabetes Brother    Hyperlipidemia Brother    Hypertension Brother    Mental retardation Sister    Alcohol abuse Paternal Grandmother    Colon cancer Neg Hx     Liver disease Neg Hx     SOCIAL HISTORY Social History   Tobacco Use   Smoking status: Every Day    Packs/day: 1.00    Years: 23.00    Pack years: 23.00    Types: Cigarettes    Start date: 12/03/2000   Smokeless tobacco: Never   Tobacco comments:    one pack daily  Vaping Use   Vaping Use: Never used  Substance Use Topics   Alcohol use: No    Alcohol/week: 0.0 standard drinks   Drug use: No    Comment: Sober for 8 years         OPHTHALMIC EXAM:  Base Eye Exam     Visual Acuity (ETDRS)       Right Left   Dist [place] 20/200 +1 LP   Dist ph [place] NI NI         Tonometry (Tonopen, 2:57 PM)       Right Left   Pressure 21 39         Pupils       Dark Light Shape React APD   Right 5 5 Round Minimal None   Left 7 7 Round fixed None         Visual Fields (Counting fingers)       Left Right   Restrictions Total superior temporal, inferior temporal, superior nasal, inferior nasal deficiencies Total superior temporal, inferior temporal, superior nasal, inferior nasal deficiencies         Extraocular Movement       Right Left    Full Full  Neuro/Psych     Oriented x3: Yes   Mood/Affect: Normal         Dilation     Both eyes: 1.0% Mydriacyl, 2.5% Phenylephrine @ 2:57 PM           Slit Lamp and Fundus Exam     External Exam       Right Left   External Normal Normal         Slit Lamp Exam       Right Left   Lids/Lashes Normal Normal   Conjunctiva/Sclera White and quiet White and quiet   Cornea Clear Clear   Anterior Chamber Tube, ST Quad Deep and quiet, glaucoma tube AC   Iris Round and reactive, old minor NVI and inactive Old and active NVI   Lens Posterior chamber intraocular lens, Open posterior capsule Posterior chamber intraocular lens   Anterior Vitreous clear  Silicone oil         Fundus Exam       Right Left   Posterior Vitreous Vitrectomized , clear Clear silicone   Disc Old fibrovascular disease of the  nerve, 3+ Pallor White nerve   C/D Ratio 0.3 Atrophic   Macula Microaneurysms, no exudates, Focal laser scars, no macular thickening Attached   Vessels PDR-quiet PDR-quiet   Periphery Laser scars,360 Laser scars,            IMAGING AND PROCEDURES  Imaging and Procedures for 05/08/21  OCT, Retina - OU - Both Eyes       Right Eye Quality was good. Scan locations included subfoveal. Central Foveal Thickness: 197. Progression has been stable. Findings include abnormal foveal contour.   Left Eye Quality was borderline. Scan locations included temporal. Central Foveal Thickness: 172. Progression has been stable. Findings include abnormal foveal contour.   Notes History of CSME OD, stabilized and improved on recurrent intravitreal Avastin also to prevent recurrent microscopic vitreous hemorrhages in this monocular patient             ASSESSMENT/PLAN:  Vitreous hemorrhage of right eye (HCC) No recurrence now for over 1 year  Rubeosis iridis of right eye Appears resolved clinically  Diabetic macular edema of right eye with proliferative retinopathy associated with type 2 diabetes mellitus ([place]) Prior macular edema now resolved and atrophy accounts for acuity.  No active PDR     ICD-10-CM   1. Diabetic macular edema of right eye with proliferative retinopathy associated with type 2 diabetes mellitus (HCC)  E11.3511 OCT, Retina - OU - Both Eyes    CANCELED: Intravitreal Injection, Pharmacologic Agent - OD - Right Eye    2. Vitreous hemorrhage of right eye ([place])  H43.11     3. Rubeosis iridis of right eye  H21.1X1       1.  OD, resolved PDR, now quiescent.  Prior macular edema has resolved as well, past we have used intravitreal Avastin accordingly basis to prevent recurrence of progression of PDR with iris rubeosis and micro vitreous hemorrhages.  Patient asked today whether we can attempt to observe that condition and simply monitor.  I have agreed that we can do this  and should she develop any change in her acuity we can immediately reinstate therapy.  2.  OS vision limited by diffuse atrophy of the macula and retina  3.  Specifically no injection delivered OD today  Ophthalmic Meds Ordered this visit:  No orders of the defined types were placed in this encounter.      Return in  about 4 months (around 09/08/2021) for DILATE OU, COLOR FP, OCT.  There are no Patient Instructions on file for this visit.   Explained the diagnoses, plan, and follow up with the patient and they expressed understanding.  Patient expressed understanding of the importance of proper follow up care.   Kara Knapp M.D. Diseases & Surgery of the Retina and Vitreous Retina & Diabetic [place] 05/08/21     Abbreviations: M myopia (nearsighted); A astigmatism; H hyperopia (farsighted); P presbyopia; Mrx spectacle prescription;  CTL contact lenses; OD right eye; OS left eye; OU both eyes  XT exotropia; ET esotropia; PEK punctate epithelial keratitis; PEE punctate epithelial erosions; DES dry eye syndrome; MGD meibomian gland dysfunction; ATs artificial tears; PFAT's preservative free artificial tears; [place] nuclear sclerotic cataract; PSC posterior subcapsular cataract; ERM epi-retinal membrane; PVD posterior vitreous detachment; RD retinal detachment; DM diabetes mellitus; DR diabetic retinopathy; NPDR non-proliferative diabetic retinopathy; PDR proliferative diabetic retinopathy; CSME clinically significant macular edema; DME diabetic macular edema; dbh dot blot hemorrhages; CWS cotton wool spot; POAG primary open angle glaucoma; C/D cup-to-disc ratio; HVF humphrey visual field; GVF goldmann visual field; OCT optical coherence tomography; IOP intraocular pressure; BRVO Branch retinal vein occlusion; CRVO central retinal vein occlusion; CRAO central retinal artery occlusion; BRAO branch retinal artery occlusion; RT retinal tear; SB scleral buckle; PPV pars plana vitrectomy; VH  Vitreous hemorrhage; PRP panretinal laser photocoagulation; IVK intravitreal kenalog; VMT vitreomacular traction; MH Macular hole;  NVD neovascularization of the disc; NVE neovascularization elsewhere; AREDS age related eye disease study; ARMD age related macular degeneration; POAG primary open angle glaucoma; EBMD epithelial/anterior basement membrane dystrophy; ACIOL anterior chamber intraocular lens; IOL intraocular lens; PCIOL posterior chamber intraocular lens; Phaco/IOL phacoemulsification with intraocular lens placement; [place] photorefractive keratectomy; LASIK laser assisted in situ keratomileusis; HTN hypertension; DM diabetes mellitus; COPD chronic obstructive pulmonary disease

## 2021-05-08 NOTE — Assessment & Plan Note (Signed)
No recurrence now for over 1 year

## 2021-05-08 NOTE — Assessment & Plan Note (Signed)
Appears resolved clinically

## 2021-05-09 DIAGNOSIS — Z992 Dependence on renal dialysis: Secondary | ICD-10-CM | POA: Diagnosis not present

## 2021-05-09 DIAGNOSIS — N186 End stage renal disease: Secondary | ICD-10-CM | POA: Diagnosis not present

## 2021-05-09 DIAGNOSIS — Z23 Encounter for immunization: Secondary | ICD-10-CM | POA: Diagnosis not present

## 2021-05-11 DIAGNOSIS — Z23 Encounter for immunization: Secondary | ICD-10-CM | POA: Diagnosis not present

## 2021-05-11 DIAGNOSIS — Z992 Dependence on renal dialysis: Secondary | ICD-10-CM | POA: Diagnosis not present

## 2021-05-11 DIAGNOSIS — N186 End stage renal disease: Secondary | ICD-10-CM | POA: Diagnosis not present

## 2021-05-13 DIAGNOSIS — Z992 Dependence on renal dialysis: Secondary | ICD-10-CM | POA: Diagnosis not present

## 2021-05-13 DIAGNOSIS — Z23 Encounter for immunization: Secondary | ICD-10-CM | POA: Diagnosis not present

## 2021-05-13 DIAGNOSIS — N186 End stage renal disease: Secondary | ICD-10-CM | POA: Diagnosis not present

## 2021-05-16 ENCOUNTER — Encounter (HOSPITAL_COMMUNITY): Payer: Medicare Other

## 2021-05-16 DIAGNOSIS — Z23 Encounter for immunization: Secondary | ICD-10-CM | POA: Diagnosis not present

## 2021-05-16 DIAGNOSIS — Z992 Dependence on renal dialysis: Secondary | ICD-10-CM | POA: Diagnosis not present

## 2021-05-16 DIAGNOSIS — N186 End stage renal disease: Secondary | ICD-10-CM | POA: Diagnosis not present

## 2021-05-16 DIAGNOSIS — N250 Renal osteodystrophy: Secondary | ICD-10-CM | POA: Diagnosis not present

## 2021-05-17 ENCOUNTER — Encounter (HOSPITAL_COMMUNITY): Payer: Medicare Other

## 2021-05-18 DIAGNOSIS — N186 End stage renal disease: Secondary | ICD-10-CM | POA: Diagnosis not present

## 2021-05-18 DIAGNOSIS — Z992 Dependence on renal dialysis: Secondary | ICD-10-CM | POA: Diagnosis not present

## 2021-05-18 DIAGNOSIS — Z23 Encounter for immunization: Secondary | ICD-10-CM | POA: Diagnosis not present

## 2021-05-20 DIAGNOSIS — Z23 Encounter for immunization: Secondary | ICD-10-CM | POA: Diagnosis not present

## 2021-05-20 DIAGNOSIS — N186 End stage renal disease: Secondary | ICD-10-CM | POA: Diagnosis not present

## 2021-05-20 DIAGNOSIS — Z992 Dependence on renal dialysis: Secondary | ICD-10-CM | POA: Diagnosis not present

## 2021-05-22 ENCOUNTER — Other Ambulatory Visit: Payer: Self-pay

## 2021-05-22 ENCOUNTER — Ambulatory Visit (INDEPENDENT_AMBULATORY_CARE_PROVIDER_SITE_OTHER): Payer: Medicare Other | Admitting: Clinical

## 2021-05-22 DIAGNOSIS — F3341 Major depressive disorder, recurrent, in partial remission: Secondary | ICD-10-CM | POA: Diagnosis not present

## 2021-05-22 NOTE — Progress Notes (Signed)
Virtual Visit via Telephone Note   I connected with Kara Knapp on 05/22/2021 at 9:00 AM EDT by telephone and verified that I am speaking with the correct person using two identifiers.   Location: Patient: Home Provider: Office    I discussed the limitations of evaluation and management by telemedicine and the availability of in person appointments. The patient expressed understanding and agreed to proceed.       THERAPIST PROGRESS NOTE   Session Time: 9:00 AM-9:30AM   Participation Level: Active   Behavioral Response: CasualAlertDepressed   Type of Therapy: Individual Therapy   Treatment Goals addressed: Coping   Interventions: CBT   Summary: Kara Knapp is a 50 y.o. female who presents with Depression. The OPT therapist worked with the patient for her ongoing OPT treatment session. The OPT therapist utilized Motivational Interviewing to assist in creating therapeutic repore. The patient in the session was engaged and work in collaboration giving feedback about her triggers and symptoms over the course of the past few weeks. The patient spoke about the passing of an extended family member as well as the passing of her aunt. The patient spoke about being involved in the funeral services.The OPT therapist utilized Cognitive Behavioral Therapy through cognitive restructuring as well as worked with the patient on coping strategies to assist in management of mood and as she continues to work on her interactions with others. The patient reported the she continues to work on implementing positive thinking, self esteem, and staying active as well as being aware of and consistent in keeping her health care appointments. The patient spoke about .   Suicidal/Homicidal: Nowithout intent/plan   Therapist Response: The OPT therapist worked with the patient for the patients scheduled session. The patient was engaged in his session and gave feedback in relation to triggers, symptoms, and  behavior responses over the past few weeks. The OPT therapist worked with the patient utilizing an in session Cognitive Behavioral Therapy exercise. The patient was responsive in the session and verbalized, " My aunt passed away which we knew it was coming but still difficult and I feel for my cousin who lost her mom".The OPT therapist worked with the patient on implementing positive thinking and reviewing upcoming care appointments. The OPT therapist gauged the patents mood through scale measuring within the session. The patient spoke about ongoing work on her own short term goals in areas of housing and finanaces, and continuing to focus on her health including decreasing her smoking. The patient spoke about her experience in going to see her daughter in [place] and noted this was a really good experience. The OPT therapist continued to provide support/encouragement and worked with the patient on staying motivated and using coping skills. The OPT therapist review the patients level of difficulty with mood over the past few weeks and how she is coping with losing a family member. The patient spoke about continuing her dialysis and attending funeral services this coming Friday. The patient noted after recently cutting ties with a guy she felt was a negative in her life she has been much better and she noted she is currently talking to another friend.The patient noted with her kids being grown she will not be celebrating the [employer] holiday upcoming. The OPT therapist will continue treatment work with the patient in her next scheduled session.   Plan: Return again in 3 weeks.   Diagnosis:      Axis I: MDD (major depressive disorder), recurrent, in partial remission  Axis II: No diagnosis   I discussed the assessment and treatment plan with the patient. The patient was provided an opportunity to ask questions and all were answered. The patient agreed with the plan and  demonstrated an understanding of the instructions.   The patient was advised to call back or seek an in-person evaluation if the symptoms worsen or if the condition fails to improve as anticipated.   I provided 30 minutes of non-face-to-face time during this encounter.   Lennox Grumbles, LCSW   05/22/2021

## 2021-05-23 ENCOUNTER — Other Ambulatory Visit: Payer: Self-pay | Admitting: Psychiatry

## 2021-05-23 ENCOUNTER — Telehealth: Payer: Self-pay

## 2021-05-23 DIAGNOSIS — Z992 Dependence on renal dialysis: Secondary | ICD-10-CM | POA: Diagnosis not present

## 2021-05-23 DIAGNOSIS — N186 End stage renal disease: Secondary | ICD-10-CM | POA: Diagnosis not present

## 2021-05-23 DIAGNOSIS — Z23 Encounter for immunization: Secondary | ICD-10-CM | POA: Diagnosis not present

## 2021-05-23 NOTE — Telephone Encounter (Signed)
pharmacy left a message that they need you to ok early refill on medication that patient will be out of medicaiton before the refill date you have set on rx

## 2021-05-23 NOTE — Telephone Encounter (Signed)
I saw that Ambien was filled on 9/30 for 30 days according to the database. The next starting date to be on 10/30. Why they think she will be running out of this medication?

## 2021-05-25 DIAGNOSIS — N186 End stage renal disease: Secondary | ICD-10-CM | POA: Diagnosis not present

## 2021-05-25 DIAGNOSIS — Z23 Encounter for immunization: Secondary | ICD-10-CM | POA: Diagnosis not present

## 2021-05-25 DIAGNOSIS — Z992 Dependence on renal dialysis: Secondary | ICD-10-CM | POA: Diagnosis not present

## 2021-05-26 DIAGNOSIS — J439 Emphysema, unspecified: Secondary | ICD-10-CM | POA: Diagnosis not present

## 2021-05-27 DIAGNOSIS — Z992 Dependence on renal dialysis: Secondary | ICD-10-CM | POA: Diagnosis not present

## 2021-05-27 DIAGNOSIS — N186 End stage renal disease: Secondary | ICD-10-CM | POA: Diagnosis not present

## 2021-05-27 DIAGNOSIS — Z23 Encounter for immunization: Secondary | ICD-10-CM | POA: Diagnosis not present

## 2021-05-29 DIAGNOSIS — N186 End stage renal disease: Secondary | ICD-10-CM | POA: Diagnosis not present

## 2021-05-29 DIAGNOSIS — E119 Type 2 diabetes mellitus without complications: Secondary | ICD-10-CM | POA: Diagnosis not present

## 2021-05-29 DIAGNOSIS — Z992 Dependence on renal dialysis: Secondary | ICD-10-CM | POA: Diagnosis not present

## 2021-05-29 DIAGNOSIS — J449 Chronic obstructive pulmonary disease, unspecified: Secondary | ICD-10-CM | POA: Diagnosis not present

## 2021-05-30 DIAGNOSIS — N186 End stage renal disease: Secondary | ICD-10-CM | POA: Diagnosis not present

## 2021-05-30 DIAGNOSIS — Z992 Dependence on renal dialysis: Secondary | ICD-10-CM | POA: Diagnosis not present

## 2021-06-01 DIAGNOSIS — N186 End stage renal disease: Secondary | ICD-10-CM | POA: Diagnosis not present

## 2021-06-01 DIAGNOSIS — Z992 Dependence on renal dialysis: Secondary | ICD-10-CM | POA: Diagnosis not present

## 2021-06-05 ENCOUNTER — Ambulatory Visit (INDEPENDENT_AMBULATORY_CARE_PROVIDER_SITE_OTHER): Payer: Medicare Other | Admitting: Clinical

## 2021-06-05 ENCOUNTER — Other Ambulatory Visit: Payer: Self-pay

## 2021-06-05 DIAGNOSIS — F3341 Major depressive disorder, recurrent, in partial remission: Secondary | ICD-10-CM

## 2021-06-05 NOTE — Progress Notes (Signed)
Virtual Visit via Telephone Note   I connected with Kara Knapp on 06/05/2021 at 9:00 AM EDT by telephone and verified that I am speaking with the correct person using two identifiers.   Location: Patient: Home Provider: Office    I discussed the limitations of evaluation and management by telemedicine and the availability of in person appointments. The patient expressed understanding and agreed to proceed.       THERAPIST PROGRESS NOTE   Session Time: 9:00 AM-9:30AM   Participation Level: Active   Behavioral Response: CasualAlertDepressed   Type of Therapy: Individual Therapy   Treatment Goals addressed: Coping   Interventions: CBT   Summary: Kara Knapp is a 50 y.o. female who presents with Depression. The OPT therapist worked with the patient for her ongoing OPT treatment session. The OPT therapist utilized Motivational Interviewing to assist in creating therapeutic repore. The patient in the session was engaged and work in collaboration giving feedback about her triggers and symptoms over the course of the past few weeks. The patient spoke about being more involved with her family and re-connecting and not dating someone that she knew from along time ago and noted this has been a positive over the last few weeks that has helped her mood.The OPT therapist utilized Cognitive Behavioral Therapy through cognitive restructuring as well as worked with the patient on coping strategies to assist in management of mood and as she continues to work on her interactions with others. The patient reported the she continues to work on implementing positive thinking, self esteem, and staying active as well as being aware of and consistent in keeping her health care appointments.    Suicidal/Homicidal: Nowithout intent/plan   Therapist Response: The OPT therapist worked with the patient for the patients scheduled session. The patient was engaged in his session and gave feedback in relation  to triggers, symptoms, and behavior responses over the past few weeks. The OPT therapist worked with the patient utilizing an in session Cognitive Behavioral Therapy exercise. The patient was responsive in the session and verbalized, " I reconnected with a old friend and we have been talking and he has started coming over more regularly".The OPT therapist worked with the patient on implementing positive thinking and reviewing upcoming care appointments. The OPT therapist gauged the patents mood through scale measuring within the session. The patient spoke about ongoing work on her own short term goals in areas of housing and finanaces, and continuing to focus on her health including decreasing her smoking. The OPT therapist continued to provide support/encouragement and worked with the patient on staying motivated and using coping skills. The OPT therapist will continue treatment work with the patient in her next scheduled session.   Plan: Return again in 3 weeks.   Diagnosis:      Axis I: MDD (major depressive disorder), recurrent, in partial remission                              Axis II: No diagnosis   I discussed the assessment and treatment plan with the patient. The patient was provided an opportunity to ask questions and all were answered. The patient agreed with the plan and demonstrated an understanding of the instructions.   The patient was advised to call back or seek an in-person evaluation if the symptoms worsen or if the condition fails to improve as anticipated.   I provided 30 minutes of non-face-to-face time during this  encounter.   Kara Grumbles, LCSW   06/05/2021

## 2021-06-06 DIAGNOSIS — Z992 Dependence on renal dialysis: Secondary | ICD-10-CM | POA: Diagnosis not present

## 2021-06-06 DIAGNOSIS — N186 End stage renal disease: Secondary | ICD-10-CM | POA: Diagnosis not present

## 2021-06-07 ENCOUNTER — Ambulatory Visit (HOSPITAL_COMMUNITY)
Admission: RE | Admit: 2021-06-07 | Discharge: 2021-06-07 | Disposition: A | Discharge status: Home / Self Care | Payer: Medicare Other | Source: Ambulatory Visit | Attending: Vascular Surgery | Admitting: Vascular Surgery

## 2021-06-07 ENCOUNTER — Other Ambulatory Visit: Payer: Self-pay

## 2021-06-07 ENCOUNTER — Ambulatory Visit (INDEPENDENT_AMBULATORY_CARE_PROVIDER_SITE_OTHER): Payer: Medicare Other | Admitting: Physician Assistant

## 2021-06-07 VITALS — BP 128/57 | HR 68 | Temp 97.6°F | Resp 20 | Ht 63.0 in | Wt 170.5 lb

## 2021-06-07 DIAGNOSIS — N186 End stage renal disease: Secondary | ICD-10-CM | POA: Diagnosis not present

## 2021-06-07 DIAGNOSIS — Z992 Dependence on renal dialysis: Secondary | ICD-10-CM | POA: Insufficient documentation

## 2021-06-07 NOTE — Progress Notes (Signed)
POST OPERATIVE DIALYSIS ACCESS OFFICE NOTE    CC:  F/u for dialysis access surgery  HPI:  This is a 50 y.o. female who is s/p  PROCEDURE:   1) Tunneled dialysis catheter placement (19cm RIJ) 2) Left upper extremity arteriovenous graft pseudoaneurysm excision 3) Creation of new left upper extremity arteriovenous graft (66m PTFE)   These were performed by Dr. HStanford Breedon March 27, 2021.Performed by Dr. HStanford Breedin 03/27/2021. She denies hand pain or numbness.  Dialysis days:  T/T/S   Dialysis center:  [facility]  Allergies  Allergen Reactions   Bactrim [Sulfamethoxazole-Trimethoprim] Nausea And Vomiting   Prednisone Other (See Comments)    "I was wide open and couldn't eat" per pt. Loss of appetite and insomnia  LOSS OF APPETITE,UNABLE TO SLEEP    Current Outpatient Medications  Medication Sig Dispense Refill   acetaminophen (TYLENOL) 500 MG tablet Take 1,000 mg by mouth every 6 (six) hours as needed for moderate pain or headache.     [START ON 06/15/2021] ARIPiprazole (ABILIFY) 5 MG tablet Take 1 tablet (5 mg total) by mouth daily. 90 tablet 0   azelastine (ASTELIN) 0.1 % nasal spray Place 1 spray into both nostrils 2 (two) times daily. Use in each nostril as directed 30 mL 12   carvedilol (COREG) 12.5 MG tablet Take 1 tablet (12.5 mg total) by mouth 2 (two) times daily. 60 tablet 5   COMBIGAN 0.2-0.5 % [facility] (Patient taking differently: Place 1 drop into both eyes at bedtime.) 10 mL 0   Continuous Blood Gluc Sensor (FREESTYLE LIBRE 14 DAY SENSOR) MISC Inject 1 each into the skin every 14 (fourteen) days. Use as directed. 2 each 2   cyclobenzaprine (FLEXERIL) 10 MG tablet Take 10 mg by mouth 2 (two) times daily as needed for muscle spasms.     dexamethasone (DECADRON) 4 MG tablet Take 1 tablet (4 mg total) by mouth daily. 5 tablet 0   doxycycline (VIBRA-TABS) 100 MG tablet Take 100 mg by mouth 2 (two) times daily.      fluticasone (FLONASE) 50 MCG/ACT nasal spray Place 2 sprays into both nostrils daily as needed for allergies or rhinitis.     gabapentin (NEURONTIN) 100 MG capsule Take 1 capsule (100 mg total) by mouth 3 (three) times daily. (Needs to be seen before next refill) 90 capsule 0   glucose blood (PRODIGY NO CODING BLOOD GLUC) test strip USE TO CHECK BLOOD SUGAR TWICE DAILY. Dx E11.22 200 each 3   hydrALAZINE (APRESOLINE) 50 MG tablet Take 1 tablet (50 mg total) by mouth 2 (two) times daily. 60 tablet 3   Insulin Pen Needle (TRUEPLUS PEN NEEDLES) 31G X 5 MM MISC Use daily with insulin Dx E11.22 100 each 3   ipratropium-albuterol (DUONEB) 0.5-2.5 (3) MG/3ML SOLN SMARTSIG:1 Vial(s) Via Nebulizer 4 Times Daily PRN     LANTUS SOLOSTAR 100 UNIT/ML Solostar Pen INJECT 18-50 UNITS SUBCUTANEOUSLY AT BEDTIME. (Patient taking differently: Inject 20 Units into the skin at bedtime.) 15 mL 0   lidocaine-prilocaine (EMLA) cream Apply 1 application topically Every Tuesday,Thursday,and Saturday with dialysis.     linagliptin (TRADJENTA) 5 MG TABS tablet Take 1 tablet (5 mg total) by mouth daily. (Patient taking differently: Take 5 mg by mouth every evening.) 90 tablet 3   multivitamin (RENA-VIT) TABS tablet Take 1 tablet by mouth daily.     oxyCODONE-acetaminophen (PERCOCET) 5-325 MG tablet Take 1 tablet by mouth every 6 (six) hours  as needed. 20 tablet 0   pantoprazole (PROTONIX) 40 MG tablet Take 40 mg by mouth daily.     polyethylene glycol-electrolytes (TRILYTE) 420 g solution Take 4,000 mLs by mouth as directed. 4000 mL 0   PRODIGY TWIST TOP LANCETS 28G MISC USE TO CHECK BLOOD SUGAR UP TO FOUR TIMES DAILY. 100 each 1   sertraline (ZOLOFT) 100 MG tablet Take 1.5 tablets (150 mg total) by mouth daily. 135 tablet 1   sevelamer carbonate (RENVELA) 800 MG tablet Take 800-2,400 mg by mouth See admin instructions. Take 2400 mg by mouth three times daily with means and 800 mg with snacks     SYMBICORT 160-4.5 MCG/ACT  inhaler Inhale 2 puffs into the lungs 2 (two) times daily. 10.2 g 2   VENTOLIN HFA 108 (90 Base) MCG/ACT inhaler Inhale 2 puffs into the lungs every 4 (four) hours as needed for wheezing or shortness of breath. 18 g 2   zolpidem (AMBIEN) 5 MG tablet Take 1 tablet (5 mg total) by mouth at bedtime as needed for sleep. 30 tablet 2   No current facility-administered medications for this visit.     ROS:  See HPI  LMP 05/05/2015   Vitals:   06/07/21 1426  BP: (!) 128/57  Pulse: 68  Resp: 20  Temp: 97.6 F (36.4 C)  SpO2: 96%    Physical Exam:  General appearance: awake, alert in NAD Chest: catheter site without bleeding or erythema. Dressing dry and intact. Respirations: unlabored; no dyspnea at rest Left upper extremity: Hand is warm with 5/5 grip strength. Motor function and sensation intact. Good bruit in graft. 2+ radial pulse. Incision(s): Well healed.   Dialysis duplex on 06/07/2021 Findings:         +--------------------+----------+-----------------+-------------------+  AVG                 PSV (cm/s)Flow Vol (mL/min)     Describe        +--------------------+----------+-----------------+-------------------+  Native artery inflow   188           570                            +--------------------+----------+-----------------+-------------------+  Arterial anastomosis   231                                          +--------------------+----------+-----------------+-------------------+  Prox graft             611           682       narrowing/ stenosis  +--------------------+----------+-----------------+-------------------+  Mid graft              337           845            stenosis        +--------------------+----------+-----------------+-------------------+  Distal graft           327          1007                            +--------------------+----------+-----------------+-------------------+  Venous anastomosis     427           1634                            +--------------------+----------+-----------------+-------------------+  Venous outflow         140                                          +--------------------+----------+-----------------+-------------------+    Summary:  Arteriovenous graft-Stenosis noted.   *See table(s) above for measurements and observations.     Diagnosing physician: Servando Snare MD    ---------------------------------------------------------------------------  -----    Preliminary    Assessment/Plan:   -pt does not have evidence of steal syndrome -graft with good bruit and incisions well healed -the graft may be used starting immediately  Barbie Banner, PA-C 06/07/2021 2:26 PM [facility] [phone]  Clinic MD:  Dr. Donzetta Matters

## 2021-06-07 NOTE — H&P (View-Only) (Signed)
POST OPERATIVE DIALYSIS ACCESS OFFICE NOTE    CC:  F/u for dialysis access surgery  HPI:  This is a 50 y.o. female who is s/p  PROCEDURE:   1) Tunneled dialysis catheter placement (19cm RIJ) 2) Left upper extremity arteriovenous graft pseudoaneurysm excision 3) Creation of new left upper extremity arteriovenous graft (54m PTFE)   These were performed by Dr. HStanford Breedon March 27, 2021.Performed by Dr. HStanford Breedin 03/27/2021. She denies hand pain or numbness.  Dialysis days:  T/T/S   Dialysis center:  [facility]  Allergies  Allergen Reactions   Bactrim [Sulfamethoxazole-Trimethoprim] Nausea And Vomiting   Prednisone Other (See Comments)    "I was wide open and couldn't eat" per pt. Loss of appetite and insomnia  LOSS OF APPETITE,UNABLE TO SLEEP    Current Outpatient Medications  Medication Sig Dispense Refill   acetaminophen (TYLENOL) 500 MG tablet Take 1,000 mg by mouth every 6 (six) hours as needed for moderate pain or headache.     [START ON 06/15/2021] ARIPiprazole (ABILIFY) 5 MG tablet Take 1 tablet (5 mg total) by mouth daily. 90 tablet 0   azelastine (ASTELIN) 0.1 % nasal spray Place 1 spray into both nostrils 2 (two) times daily. Use in each nostril as directed 30 mL 12   carvedilol (COREG) 12.5 MG tablet Take 1 tablet (12.5 mg total) by mouth 2 (two) times daily. 60 tablet 5   COMBIGAN 0.2-0.5 % ophthalmic solution INSTILL [facility] (Patient taking differently: Place 1 drop into both eyes at bedtime.) 10 mL 0   Continuous Blood Gluc Sensor (FREESTYLE LIBRE 14 DAY SENSOR) MISC Inject 1 each into the skin every 14 (fourteen) days. Use as directed. 2 each 2   cyclobenzaprine (FLEXERIL) 10 MG tablet Take 10 mg by mouth 2 (two) times daily as needed for muscle spasms.     dexamethasone (DECADRON) 4 MG tablet Take 1 tablet (4 mg total) by mouth daily. 5 tablet 0   doxycycline (VIBRA-TABS) 100 MG tablet Take 100 mg by mouth 2 (two) times daily.      fluticasone (FLONASE) 50 MCG/ACT nasal spray Place 2 sprays into both nostrils daily as needed for allergies or rhinitis.     gabapentin (NEURONTIN) 100 MG capsule Take 1 capsule (100 mg total) by mouth 3 (three) times daily. (Needs to be seen before next refill) 90 capsule 0   glucose blood (PRODIGY NO CODING BLOOD GLUC) test strip USE TO CHECK BLOOD SUGAR TWICE DAILY. Dx E11.22 200 each 3   hydrALAZINE (APRESOLINE) 50 MG tablet Take 1 tablet (50 mg total) by mouth 2 (two) times daily. 60 tablet 3   Insulin Pen Needle (TRUEPLUS PEN NEEDLES) 31G X 5 MM MISC Use daily with insulin Dx E11.22 100 each 3   ipratropium-albuterol (DUONEB) 0.5-2.5 (3) MG/3ML SOLN SMARTSIG:1 Vial(s) Via Nebulizer 4 Times Daily PRN     LANTUS SOLOSTAR 100 UNIT/ML Solostar Pen INJECT 18-50 UNITS SUBCUTANEOUSLY AT BEDTIME. (Patient taking differently: Inject 20 Units into the skin at bedtime.) 15 mL 0   lidocaine-prilocaine (EMLA) cream Apply 1 application topically Every Tuesday,Thursday,and Saturday with dialysis.     linagliptin (TRADJENTA) 5 MG TABS tablet Take 1 tablet (5 mg total) by mouth daily. (Patient taking differently: Take 5 mg by mouth every evening.) 90 tablet 3   multivitamin (RENA-VIT) TABS tablet Take 1 tablet by mouth daily.     oxyCODONE-acetaminophen (PERCOCET) 5-325 MG tablet Take 1 tablet by mouth every 6 (six) hours  as needed. 20 tablet 0   pantoprazole (PROTONIX) 40 MG tablet Take 40 mg by mouth daily.     polyethylene glycol-electrolytes (TRILYTE) 420 g solution Take 4,000 mLs by mouth as directed. 4000 mL 0   PRODIGY TWIST TOP LANCETS 28G MISC USE TO CHECK BLOOD SUGAR UP TO FOUR TIMES DAILY. 100 each 1   sertraline (ZOLOFT) 100 MG tablet Take 1.5 tablets (150 mg total) by mouth daily. 135 tablet 1   sevelamer carbonate (RENVELA) 800 MG tablet Take 800-2,400 mg by mouth See admin instructions. Take 2400 mg by mouth three times daily with means and 800 mg with snacks     SYMBICORT 160-4.5 MCG/ACT  inhaler Inhale 2 puffs into the lungs 2 (two) times daily. 10.2 g 2   VENTOLIN HFA 108 (90 Base) MCG/ACT inhaler Inhale 2 puffs into the lungs every 4 (four) hours as needed for wheezing or shortness of breath. 18 g 2   zolpidem (AMBIEN) 5 MG tablet Take 1 tablet (5 mg total) by mouth at bedtime as needed for sleep. 30 tablet 2   No current facility-administered medications for this visit.     ROS:  See HPI  LMP 05/05/2015   Vitals:   06/07/21 1426  BP: (!) 128/57  Pulse: 68  Resp: 20  Temp: 97.6 F (36.4 C)  SpO2: 96%    Physical Exam:  General appearance: awake, alert in NAD Chest: catheter site without bleeding or erythema. Dressing dry and intact. Respirations: unlabored; no dyspnea at rest Left upper extremity: Hand is warm with 5/5 grip strength. Motor function and sensation intact. Good bruit in graft. 2+ radial pulse. Incision(s): Well healed.   Dialysis duplex on 06/07/2021 Findings:         +--------------------+----------+-----------------+-------------------+  AVG                 PSV (cm/s)Flow Vol (mL/min)     Describe        +--------------------+----------+-----------------+-------------------+  Native artery inflow   188           570                            +--------------------+----------+-----------------+-------------------+  Arterial anastomosis   231                                          +--------------------+----------+-----------------+-------------------+  Prox graft             611           682       narrowing/ stenosis  +--------------------+----------+-----------------+-------------------+  Mid graft              337           845            stenosis        +--------------------+----------+-----------------+-------------------+  Distal graft           327          1007                            +--------------------+----------+-----------------+-------------------+  Venous anastomosis     427           1634                            +--------------------+----------+-----------------+-------------------+  Venous outflow         140                                          +--------------------+----------+-----------------+-------------------+    Summary:  Arteriovenous graft-Stenosis noted.   *See table(s) above for measurements and observations.     Diagnosing physician: Servando Snare MD    ---------------------------------------------------------------------------  -----    Preliminary    Assessment/Plan:   -pt does not have evidence of steal syndrome -graft with good bruit and incisions well healed -the graft may be used starting immediately  Barbie Banner, PA-C 06/07/2021 2:26 PM [facility] [phone]  Clinic MD:  Dr. Donzetta Matters

## 2021-06-08 DIAGNOSIS — Z992 Dependence on renal dialysis: Secondary | ICD-10-CM | POA: Diagnosis not present

## 2021-06-08 DIAGNOSIS — N186 End stage renal disease: Secondary | ICD-10-CM | POA: Diagnosis not present

## 2021-06-10 DIAGNOSIS — Z992 Dependence on renal dialysis: Secondary | ICD-10-CM | POA: Diagnosis not present

## 2021-06-10 DIAGNOSIS — N186 End stage renal disease: Secondary | ICD-10-CM | POA: Diagnosis not present

## 2021-06-13 DIAGNOSIS — Z992 Dependence on renal dialysis: Secondary | ICD-10-CM | POA: Diagnosis not present

## 2021-06-13 DIAGNOSIS — N186 End stage renal disease: Secondary | ICD-10-CM | POA: Diagnosis not present

## 2021-06-15 DIAGNOSIS — Z992 Dependence on renal dialysis: Secondary | ICD-10-CM | POA: Diagnosis not present

## 2021-06-15 DIAGNOSIS — N186 End stage renal disease: Secondary | ICD-10-CM | POA: Diagnosis not present

## 2021-06-17 DIAGNOSIS — N186 End stage renal disease: Secondary | ICD-10-CM | POA: Diagnosis not present

## 2021-06-17 DIAGNOSIS — Z992 Dependence on renal dialysis: Secondary | ICD-10-CM | POA: Diagnosis not present

## 2021-06-19 ENCOUNTER — Telehealth: Payer: Self-pay

## 2021-06-19 NOTE — Telephone Encounter (Signed)
Received ref from [name], [place] requesting fistulofram d/t extremely faint bruit/thrill in upper side of AVG and will not ruin on tx

## 2021-06-20 ENCOUNTER — Other Ambulatory Visit: Payer: Self-pay

## 2021-06-20 DIAGNOSIS — N186 End stage renal disease: Secondary | ICD-10-CM | POA: Diagnosis not present

## 2021-06-20 DIAGNOSIS — Z992 Dependence on renal dialysis: Secondary | ICD-10-CM | POA: Diagnosis not present

## 2021-06-20 NOTE — Telephone Encounter (Signed)
Spoke with patient and offered to schedule left upper arm AVG shuntogram on tomorrow, but patient declined due to transportation. Procedure scheduled for 06/30/21.

## 2021-06-24 DIAGNOSIS — Z992 Dependence on renal dialysis: Secondary | ICD-10-CM | POA: Diagnosis not present

## 2021-06-24 DIAGNOSIS — N186 End stage renal disease: Secondary | ICD-10-CM | POA: Diagnosis not present

## 2021-06-26 DIAGNOSIS — J439 Emphysema, unspecified: Secondary | ICD-10-CM | POA: Diagnosis not present

## 2021-06-27 DIAGNOSIS — Z992 Dependence on renal dialysis: Secondary | ICD-10-CM | POA: Diagnosis not present

## 2021-06-27 DIAGNOSIS — N186 End stage renal disease: Secondary | ICD-10-CM | POA: Diagnosis not present

## 2021-06-28 DIAGNOSIS — Z992 Dependence on renal dialysis: Secondary | ICD-10-CM | POA: Diagnosis not present

## 2021-06-28 DIAGNOSIS — N186 End stage renal disease: Secondary | ICD-10-CM | POA: Diagnosis not present

## 2021-06-29 DIAGNOSIS — N186 End stage renal disease: Secondary | ICD-10-CM | POA: Diagnosis not present

## 2021-06-29 DIAGNOSIS — Z992 Dependence on renal dialysis: Secondary | ICD-10-CM | POA: Diagnosis not present

## 2021-06-29 DIAGNOSIS — Z7689 Persons encountering health services in other specified circumstances: Secondary | ICD-10-CM | POA: Diagnosis not present

## 2021-06-30 ENCOUNTER — Ambulatory Visit (HOSPITAL_COMMUNITY)
Admission: RE | Admit: 2021-06-30 | Discharge: 2021-06-30 | Disposition: A | Discharge status: Home / Self Care | Payer: Medicare Other | Source: Ambulatory Visit | Attending: Vascular Surgery | Admitting: Vascular Surgery

## 2021-06-30 ENCOUNTER — Encounter (HOSPITAL_COMMUNITY)
Admission: RE | Disposition: A | Discharge status: Home / Self Care | Payer: Self-pay | Source: Ambulatory Visit | Attending: Vascular Surgery

## 2021-06-30 ENCOUNTER — Other Ambulatory Visit: Payer: Self-pay

## 2021-06-30 DIAGNOSIS — N186 End stage renal disease: Secondary | ICD-10-CM | POA: Diagnosis not present

## 2021-06-30 DIAGNOSIS — T82858A Stenosis of vascular prosthetic devices, implants and grafts, initial encounter: Secondary | ICD-10-CM | POA: Insufficient documentation

## 2021-06-30 DIAGNOSIS — Y841 Kidney dialysis as the cause of abnormal reaction of the patient, or of later complication, without mention of misadventure at the time of the procedure: Secondary | ICD-10-CM | POA: Diagnosis not present

## 2021-06-30 DIAGNOSIS — Z7984 Long term (current) use of oral hypoglycemic drugs: Secondary | ICD-10-CM | POA: Insufficient documentation

## 2021-06-30 DIAGNOSIS — Z992 Dependence on renal dialysis: Secondary | ICD-10-CM | POA: Insufficient documentation

## 2021-06-30 HISTORY — PX: A/V SHUNTOGRAM: CATH118297

## 2021-06-30 HISTORY — PX: PERIPHERAL VASCULAR BALLOON ANGIOPLASTY: CATH118281

## 2021-06-30 LAB — GLUCOSE, CAPILLARY
Glucose-Capillary: 97 mg/dL (ref 70–99)
Glucose-Capillary: 85 mg/dL (ref 70–99)

## 2021-06-30 LAB — POCT I-STAT, CHEM 8
BUN: 32 mg/dL — ABNORMAL HIGH (ref 6–20)
Calcium, Ion: 1.19 mmol/L (ref 1.15–1.40)
Chloride: 101 mmol/L (ref 98–111)
Creatinine, Ser: 6.1 mg/dL — ABNORMAL HIGH (ref 0.44–1.00)
Glucose, Bld: 102 mg/dL — ABNORMAL HIGH (ref 70–99)
HCT: 30 % — ABNORMAL LOW (ref 36.0–46.0)
Hemoglobin: 10.2 g/dL — ABNORMAL LOW (ref 12.0–15.0)
Potassium: 5 mmol/L (ref 3.5–5.1)
Sodium: 139 mmol/L (ref 135–145)
TCO2: 31 mmol/L (ref 22–32)

## 2021-06-30 SURGERY — A/V SHUNTOGRAM
Anesthesia: LOCAL

## 2021-06-30 MED ORDER — LIDOCAINE HCL (PF) 1 % IJ SOLN
INTRAMUSCULAR | Status: AC
  Filled 2021-06-30: qty 30

## 2021-06-30 MED ORDER — HEPARIN (PORCINE) IN NACL 1000-0.9 UT/500ML-% IV SOLN
INTRAVENOUS | Status: DC | PRN
  Administered 2021-06-30: 500 mL

## 2021-06-30 MED ORDER — FENTANYL CITRATE (PF) 100 MCG/2ML IJ SOLN
INTRAMUSCULAR | Status: AC
  Filled 2021-06-30: qty 2

## 2021-06-30 MED ORDER — SODIUM CHLORIDE 0.9 % IV SOLN: 250.0000 mL | INTRAVENOUS | Status: DC | PRN

## 2021-06-30 MED ORDER — IODIXANOL 320 MG/ML IV SOLN
INTRAVENOUS | Status: DC | PRN
  Administered 2021-06-30: 25 mg

## 2021-06-30 MED ORDER — MIDAZOLAM HCL 2 MG/2ML IJ SOLN
INTRAMUSCULAR | Status: DC | PRN
  Administered 2021-06-30: 1 mg via INTRAVENOUS

## 2021-06-30 MED ORDER — FENTANYL CITRATE (PF) 100 MCG/2ML IJ SOLN
INTRAMUSCULAR | Status: DC | PRN
  Administered 2021-06-30: 50 ug via INTRAVENOUS

## 2021-06-30 MED ORDER — HEPARIN (PORCINE) IN NACL 1000-0.9 UT/500ML-% IV SOLN
INTRAVENOUS | Status: AC
  Filled 2021-06-30: qty 500

## 2021-06-30 MED ORDER — LIDOCAINE HCL (PF) 1 % IJ SOLN
INTRAMUSCULAR | Status: DC | PRN
  Administered 2021-06-30: 2 mL via INTRADERMAL

## 2021-06-30 MED ORDER — MIDAZOLAM HCL 2 MG/2ML IJ SOLN
INTRAMUSCULAR | Status: AC
  Filled 2021-06-30: qty 2

## 2021-06-30 MED ORDER — SODIUM CHLORIDE 0.9% FLUSH: 3.0000 mL | Freq: Two times a day (BID) | INTRAVENOUS | Status: DC

## 2021-06-30 MED ORDER — SODIUM CHLORIDE 0.9% FLUSH: 3.0000 mL | INTRAVENOUS | Status: DC | PRN

## 2021-06-30 SURGICAL SUPPLY — 13 items
BALLN MUSTANG 7.0X20 75 (BALLOONS) ×3
BALLOON MUSTANG 7.0X20 75 (BALLOONS) ×2 IMPLANT
COVER DOME SNAP 22 D (MISCELLANEOUS) ×3 IMPLANT
KIT ENCORE 26 ADVANTAGE (KITS) ×6 IMPLANT
KIT MICROPUNCTURE NIT STIFF (SHEATH) ×3 IMPLANT
PROTECTION STATION PRESSURIZED (MISCELLANEOUS) ×3
SHEATH PINNACLE R/O II 6F 4CM (SHEATH) ×3 IMPLANT
SHEATH PROBE COVER 6X72 (BAG) ×3 IMPLANT
STATION PROTECTION PRESSURIZED (MISCELLANEOUS) ×2 IMPLANT
STOPCOCK MORSE 400PSI 3WAY (MISCELLANEOUS) ×3 IMPLANT
TRAY PV CATH (CUSTOM PROCEDURE TRAY) ×3 IMPLANT
TUBING CIL FLEX 10 FLL-RA (TUBING) ×3 IMPLANT
WIRE BENTSON .035X145CM (WIRE) ×3 IMPLANT

## 2021-06-30 NOTE — Interval H&P Note (Signed)
History and Physical Interval Note:  06/30/2021 10:22 AM  Kara Knapp  has presented today for surgery, with the diagnosis of malfunction of avg.  The various methods of treatment have been discussed with the patient and family. After consideration of risks, benefits and other options for treatment, the patient has consented to  Procedure(s): A/V SHUNTOGRAM (N/A) as a surgical intervention.  The patient's history has been reviewed, patient examined, no change in status, stable for surgery.  I have reviewed the patient's chart and labs.  Questions were answered to the patient's satisfaction.     Cherre Robins

## 2021-06-30 NOTE — Op Note (Signed)
DATE OF SERVICE: 06/30/2021  PATIENT:  Kara Knapp  50 y.o. female  PRE-OPERATIVE DIAGNOSIS:  ESRD  POST-OPERATIVE DIAGNOSIS:  Same  PROCEDURE:   1) left upper extremity fistulagram (41m total contrast) 2) left upper extremity arteriovenous graft venous anastomosis angioplasty ([id number]) 3) Conscious sedation (15 minutes)  SURGEON:  ThYevonne AlineHaStanford BreedMD  ASSISTANT: none  ANESTHESIA:   local and IV sedation  ESTIMATED BLOOD LOSS: min  LOCAL MEDICATIONS USED:  LIDOCAINE   COUNTS: confirmed correct.  PATIENT DISPOSITION:  PACU - hemodynamically stable.   Delay start of Pharmacological VTE agent (>24hrs) due to surgical blood loss or risk of bleeding: no  INDICATION FOR PROCEDURE: Kara Knapp is a [age].o. female with ESRD dialyzing via RIJ TDC. I revised a left arm arteriovenous graft for her in August. This has been difficult to use at her dialysis center. After careful discussion of risks, benefits, and alternatives the patient was offered fistulagram. The patient  understood and wished to proceed.  OPERATIVE FINDINGS:  Mild stenosis at the left arteriovenous graft venous anastomosis. Resolved with angioplasty ([id number]).  No central venous issues. No inflow issues. No issues at graft-graft anastomosis.  DESCRIPTION OF PROCEDURE: After identification of the patient in the pre-operative holding area, the patient was transferred to the operating room. The patient was positioned supine on the operating room table. Anesthesia was induced. The groins was prepped and draped in standard fashion. A surgical pause was performed confirming correct patient, procedure, and operative location.  The left arm was anesthetized with subcutaneous injection of 1% lidocaine. The arteriovenous graft was accessed with micropuncture technique. The 45F sheath was upsized to 64F.  Fistulogram was performed in multiple stations over the central veins, the venous anastomosis, the  graft, and the arterial inflow.  We did identify an area of narrowing at the venous anastomosis.  A Bentson wire was used to cross this narrowing.  The stenosis was angioplastied with a 7 x 20 mm Mustang balloon.  Resolution of the stenosis was noted.  Less filling of axillary collaterals was noted.  Satisfied we ended the case here.  The wire and sheath were removed.  A figure-of-eight stitch was placed around the access point.  Hemostasis was achieved.  A sterile bandage was applied.  Conscious sedation was administered with the use of IV fentanyl and midazolam under continuous physician and nurse monitoring.  Heart rate, blood pressure, and oxygen saturation were continuously monitored.  Total sedation time was 22 minutes  Upon completion of the case instrument and sharps counts were confirmed correct. The patient was transferred to the PACU in good condition. I was present for all portions of the procedure.  PLAN: To use graft.  Follow-up with me for any problems with graft at dialysis.  We will need to consider open revision or creation of new access if graft continues to malfunction.  ThoYevonne AlineawStanford BreedD [facility] Number: ([id number]/08/2020 11:57 AM

## 2021-07-01 DIAGNOSIS — Z7689 Persons encountering health services in other specified circumstances: Secondary | ICD-10-CM | POA: Diagnosis not present

## 2021-07-01 DIAGNOSIS — Z992 Dependence on renal dialysis: Secondary | ICD-10-CM | POA: Diagnosis not present

## 2021-07-01 DIAGNOSIS — N186 End stage renal disease: Secondary | ICD-10-CM | POA: Diagnosis not present

## 2021-07-03 ENCOUNTER — Ambulatory Visit (INDEPENDENT_AMBULATORY_CARE_PROVIDER_SITE_OTHER): Payer: Medicare Other | Admitting: Clinical

## 2021-07-03 ENCOUNTER — Other Ambulatory Visit: Payer: Self-pay

## 2021-07-03 DIAGNOSIS — F3341 Major depressive disorder, recurrent, in partial remission: Secondary | ICD-10-CM

## 2021-07-03 NOTE — Progress Notes (Signed)
Virtual Visit via Telephone Note   I connected with Kara Knapp on 07/03/2021 at 9:00 AM EDT by telephone and verified that I am speaking with the correct person using two identifiers.   Location: Patient: Home Provider: Office    I discussed the limitations of evaluation and management by telemedicine and the availability of in person appointments. The patient expressed understanding and agreed to proceed.       THERAPIST PROGRESS NOTE   Session Time: 9:00 AM-9:45 AM   Participation Level: Active   Behavioral Response: CasualAlertDepressed   Type of Therapy: Individual Therapy   Treatment Goals addressed: Coping   Interventions: CBT   Summary: Kara Knapp is a 50 y.o. female who presents with Depression. The OPT therapist worked with the patient for her ongoing OPT treatment session. The OPT therapist utilized Motivational Interviewing to assist in creating therapeutic repore. The patient in the session was engaged and work in collaboration giving feedback about her triggers and symptoms over the course of the past few weeks. The patient spoke about losing a aunt who passed away and a long time ex boyfriend who passed away, as well as stress from conflict between her son and her current boyfriend that has been affecting her mood.The OPT therapist utilized Cognitive Behavioral Therapy through cognitive restructuring as well as worked with the patient on coping strategies to assist in management of mood and as she continues to work on her interactions with others. The patient reported the she continues to work on implementing positive thinking, self esteem, and staying active as well as being aware of and consistent in keeping her health care appointments. The patient reviewed current medical appointments and her current health condition in session as she continues to have a chest cath.   Suicidal/Homicidal: Nowithout intent/plan   Therapist Response: The OPT therapist worked  with the patient for the patients scheduled session. The patient was engaged in his session and gave feedback in relation to triggers, symptoms, and behavior responses over the past few weeks. The OPT therapist worked with the patient utilizing an in session Cognitive Behavioral Therapy exercise. The patient was responsive in the session and verbalized, " I have been going through a lot with people passing away and my current relationship getting complicated because my son doesn't want me to be with my boyfriend".The OPT therapist worked with the patient on implementing positive thinking and reviewing upcoming care appointments. The OPT therapist gauged the patents mood through scale measuring within the session. The patient spoke about ongoing work on her own short term goals in areas of housing and finanaces, and continuing to focus on her health including decreasing her smoking. The OPT therapist continued to provide support/encouragement and worked with the patient on staying motivated and using coping skills. The OPT therapist will continue treatment work with the patient in her next scheduled session.   Plan: Return again in 3 weeks.   Diagnosis:      Axis I: MDD (major depressive disorder), recurrent, in partial remission                              Axis II: No diagnosis   I discussed the assessment and treatment plan with the patient. The patient was provided an opportunity to ask questions and all were answered. The patient agreed with the plan and demonstrated an understanding of the instructions.   The patient was advised to call back or  seek an in-person evaluation if the symptoms worsen or if the condition fails to improve as anticipated.   I provided 45 minutes of non-face-to-face time during this encounter.   Lennox Grumbles, LCSW   07/03/2021

## 2021-07-04 ENCOUNTER — Encounter (HOSPITAL_COMMUNITY): Payer: Self-pay | Admitting: Vascular Surgery

## 2021-07-04 DIAGNOSIS — N186 End stage renal disease: Secondary | ICD-10-CM | POA: Diagnosis not present

## 2021-07-04 DIAGNOSIS — Z7689 Persons encountering health services in other specified circumstances: Secondary | ICD-10-CM | POA: Diagnosis not present

## 2021-07-04 DIAGNOSIS — Z992 Dependence on renal dialysis: Secondary | ICD-10-CM | POA: Diagnosis not present

## 2021-07-06 DIAGNOSIS — Z7689 Persons encountering health services in other specified circumstances: Secondary | ICD-10-CM | POA: Diagnosis not present

## 2021-07-06 DIAGNOSIS — Z992 Dependence on renal dialysis: Secondary | ICD-10-CM | POA: Diagnosis not present

## 2021-07-06 DIAGNOSIS — N186 End stage renal disease: Secondary | ICD-10-CM | POA: Diagnosis not present

## 2021-07-08 DIAGNOSIS — Z992 Dependence on renal dialysis: Secondary | ICD-10-CM | POA: Diagnosis not present

## 2021-07-08 DIAGNOSIS — N186 End stage renal disease: Secondary | ICD-10-CM | POA: Diagnosis not present

## 2021-07-08 DIAGNOSIS — Z7689 Persons encountering health services in other specified circumstances: Secondary | ICD-10-CM | POA: Diagnosis not present

## 2021-07-11 DIAGNOSIS — Z7689 Persons encountering health services in other specified circumstances: Secondary | ICD-10-CM | POA: Diagnosis not present

## 2021-07-11 DIAGNOSIS — N186 End stage renal disease: Secondary | ICD-10-CM | POA: Diagnosis not present

## 2021-07-11 DIAGNOSIS — Z992 Dependence on renal dialysis: Secondary | ICD-10-CM | POA: Diagnosis not present

## 2021-07-12 DIAGNOSIS — Z299 Encounter for prophylactic measures, unspecified: Secondary | ICD-10-CM | POA: Diagnosis not present

## 2021-07-12 DIAGNOSIS — I700 Atherosclerosis of aorta: Secondary | ICD-10-CM | POA: Diagnosis not present

## 2021-07-12 DIAGNOSIS — I10 Essential (primary) hypertension: Secondary | ICD-10-CM | POA: Diagnosis not present

## 2021-07-13 DIAGNOSIS — Z992 Dependence on renal dialysis: Secondary | ICD-10-CM | POA: Diagnosis not present

## 2021-07-13 DIAGNOSIS — Z7689 Persons encountering health services in other specified circumstances: Secondary | ICD-10-CM | POA: Diagnosis not present

## 2021-07-13 DIAGNOSIS — N186 End stage renal disease: Secondary | ICD-10-CM | POA: Diagnosis not present

## 2021-07-18 DIAGNOSIS — Z992 Dependence on renal dialysis: Secondary | ICD-10-CM | POA: Diagnosis not present

## 2021-07-18 DIAGNOSIS — N186 End stage renal disease: Secondary | ICD-10-CM | POA: Diagnosis not present

## 2021-07-18 DIAGNOSIS — Z7689 Persons encountering health services in other specified circumstances: Secondary | ICD-10-CM | POA: Diagnosis not present

## 2021-07-18 DIAGNOSIS — N250 Renal osteodystrophy: Secondary | ICD-10-CM | POA: Diagnosis not present

## 2021-07-20 DIAGNOSIS — Z992 Dependence on renal dialysis: Secondary | ICD-10-CM | POA: Diagnosis not present

## 2021-07-20 DIAGNOSIS — Z7689 Persons encountering health services in other specified circumstances: Secondary | ICD-10-CM | POA: Diagnosis not present

## 2021-07-20 DIAGNOSIS — N186 End stage renal disease: Secondary | ICD-10-CM | POA: Diagnosis not present

## 2021-07-22 DIAGNOSIS — N186 End stage renal disease: Secondary | ICD-10-CM | POA: Diagnosis not present

## 2021-07-22 DIAGNOSIS — Z992 Dependence on renal dialysis: Secondary | ICD-10-CM | POA: Diagnosis not present

## 2021-07-25 DIAGNOSIS — E119 Type 2 diabetes mellitus without complications: Secondary | ICD-10-CM | POA: Diagnosis not present

## 2021-07-25 DIAGNOSIS — Z7689 Persons encountering health services in other specified circumstances: Secondary | ICD-10-CM | POA: Diagnosis not present

## 2021-07-25 DIAGNOSIS — N186 End stage renal disease: Secondary | ICD-10-CM | POA: Diagnosis not present

## 2021-07-25 DIAGNOSIS — D509 Iron deficiency anemia, unspecified: Secondary | ICD-10-CM | POA: Diagnosis not present

## 2021-07-25 DIAGNOSIS — Z794 Long term (current) use of insulin: Secondary | ICD-10-CM | POA: Diagnosis not present

## 2021-07-25 DIAGNOSIS — I259 Chronic ischemic heart disease, unspecified: Secondary | ICD-10-CM | POA: Diagnosis not present

## 2021-07-25 DIAGNOSIS — Z992 Dependence on renal dialysis: Secondary | ICD-10-CM | POA: Diagnosis not present

## 2021-07-26 DIAGNOSIS — J439 Emphysema, unspecified: Secondary | ICD-10-CM | POA: Diagnosis not present

## 2021-07-27 ENCOUNTER — Ambulatory Visit (HOSPITAL_COMMUNITY): Payer: Medicare Other | Admitting: Clinical

## 2021-07-27 DIAGNOSIS — N186 End stage renal disease: Secondary | ICD-10-CM | POA: Diagnosis not present

## 2021-07-27 DIAGNOSIS — Z7689 Persons encountering health services in other specified circumstances: Secondary | ICD-10-CM | POA: Diagnosis not present

## 2021-07-27 DIAGNOSIS — Z992 Dependence on renal dialysis: Secondary | ICD-10-CM | POA: Diagnosis not present

## 2021-07-28 ENCOUNTER — Ambulatory Visit (HOSPITAL_COMMUNITY): Payer: Medicare Other | Admitting: Clinical

## 2021-07-29 DIAGNOSIS — Z992 Dependence on renal dialysis: Secondary | ICD-10-CM | POA: Diagnosis not present

## 2021-07-29 DIAGNOSIS — Z7689 Persons encountering health services in other specified circumstances: Secondary | ICD-10-CM | POA: Diagnosis not present

## 2021-07-29 DIAGNOSIS — N186 End stage renal disease: Secondary | ICD-10-CM | POA: Diagnosis not present

## 2021-08-01 DIAGNOSIS — N186 End stage renal disease: Secondary | ICD-10-CM | POA: Diagnosis not present

## 2021-08-01 DIAGNOSIS — Z7689 Persons encountering health services in other specified circumstances: Secondary | ICD-10-CM | POA: Diagnosis not present

## 2021-08-01 DIAGNOSIS — Z992 Dependence on renal dialysis: Secondary | ICD-10-CM | POA: Diagnosis not present

## 2021-08-02 ENCOUNTER — Ambulatory Visit (INDEPENDENT_AMBULATORY_CARE_PROVIDER_SITE_OTHER): Payer: Medicare Other | Admitting: Clinical

## 2021-08-02 ENCOUNTER — Other Ambulatory Visit: Payer: Self-pay

## 2021-08-02 DIAGNOSIS — F3341 Major depressive disorder, recurrent, in partial remission: Secondary | ICD-10-CM
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
## 2021-08-03 DIAGNOSIS — N186 End stage renal disease: Secondary | ICD-10-CM | POA: Diagnosis not present

## 2021-08-03 DIAGNOSIS — Z992 Dependence on renal dialysis: Secondary | ICD-10-CM | POA: Diagnosis not present

## 2021-08-03 DIAGNOSIS — Z7689 Persons encountering health services in other specified circumstances: Secondary | ICD-10-CM | POA: Diagnosis not present

## 2021-08-05 DIAGNOSIS — Z992 Dependence on renal dialysis: Secondary | ICD-10-CM | POA: Diagnosis not present

## 2021-08-05 DIAGNOSIS — N186 End stage renal disease: Secondary | ICD-10-CM | POA: Diagnosis not present

## 2021-08-05 DIAGNOSIS — Z7689 Persons encountering health services in other specified circumstances: Secondary | ICD-10-CM | POA: Diagnosis not present

## 2021-08-08 ENCOUNTER — Telehealth: Payer: Self-pay | Admitting: Gastroenterology

## 2021-08-08 DIAGNOSIS — Z7689 Persons encountering health services in other specified circumstances: Secondary | ICD-10-CM | POA: Diagnosis not present

## 2021-08-08 DIAGNOSIS — Z992 Dependence on renal dialysis: Secondary | ICD-10-CM | POA: Diagnosis not present

## 2021-08-08 DIAGNOSIS — N186 End stage renal disease: Secondary | ICD-10-CM | POA: Diagnosis not present
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
## 2021-08-08 NOTE — Telephone Encounter (Signed)
Received call from [name], PA-C with Dialysis. Patient's ferritin 18, iron sats 4%, and Hgb 8.3. She has history of iron overload due to prior iron infusions.   We need to see her in the office to discuss capsule study. I am reaching out to Hematology.   Will return Juanda Crumble' call at [phone] after discussion. Next dialysis day is Thursday.

## 2021-08-09 ENCOUNTER — Other Ambulatory Visit: Payer: Self-pay

## 2021-08-09 ENCOUNTER — Encounter: Payer: Self-pay | Admitting: Psychiatry

## 2021-08-09 ENCOUNTER — Telehealth (INDEPENDENT_AMBULATORY_CARE_PROVIDER_SITE_OTHER): Payer: Medicare Other | Admitting: Psychiatry

## 2021-08-09 DIAGNOSIS — G4700 Insomnia, unspecified: Secondary | ICD-10-CM

## 2021-08-09 DIAGNOSIS — F330 Major depressive disorder, recurrent, mild: Secondary | ICD-10-CM | POA: Diagnosis not present

## 2021-08-09 MED ORDER — MIRTAZAPINE 7.5 MG PO TABS: 7.5000 mg | ORAL_TABLET | Freq: Every day | ORAL | 1 refills | Status: DC

## 2021-08-09 NOTE — Telephone Encounter (Signed)
I spoke with Kara Knapp, [place] again. I have discussed with Hematology. Patient can receive iron infusions as in a deficit, but we need to closely monitor serologies. She will receive iron with dialysis. I spoke with Kara Knapp, and we will start with 100 mg X 5 doses spread apart (Venofer). Recheck iron studies at that time to ensure not progressing into iron overload. Hematology has offered to see patient as well, but she will be receiving iron with Nephrology.    Stacey: please arrange office visit due to worsening IDA.

## 2021-08-09 NOTE — Patient Instructions (Signed)
Continue sertraline 150 mg daily Continue Abilify 10 mg at night  Start mirtazapine 7.5 mg at night  Continue Ambien 5 mg at night  Next appointment: 2/21 at 1:20

## 2021-08-10 DIAGNOSIS — N186 End stage renal disease: Secondary | ICD-10-CM | POA: Diagnosis not present

## 2021-08-10 DIAGNOSIS — Z7689 Persons encountering health services in other specified circumstances: Secondary | ICD-10-CM | POA: Diagnosis not present

## 2021-08-10 DIAGNOSIS — Z992 Dependence on renal dialysis: Secondary | ICD-10-CM | POA: Diagnosis not present

## 2021-08-11 ENCOUNTER — Encounter: Payer: Self-pay | Admitting: Gastroenterology

## 2021-08-12 DIAGNOSIS — Z7689 Persons encountering health services in other specified circumstances: Secondary | ICD-10-CM | POA: Diagnosis not present

## 2021-08-12 DIAGNOSIS — Z992 Dependence on renal dialysis: Secondary | ICD-10-CM | POA: Diagnosis not present

## 2021-08-12 DIAGNOSIS — N186 End stage renal disease: Secondary | ICD-10-CM | POA: Diagnosis not present

## 2021-08-15 DIAGNOSIS — N186 End stage renal disease: Secondary | ICD-10-CM | POA: Diagnosis not present

## 2021-08-15 DIAGNOSIS — N250 Renal osteodystrophy: Secondary | ICD-10-CM | POA: Diagnosis not present

## 2021-08-15 DIAGNOSIS — Z7689 Persons encountering health services in other specified circumstances: Secondary | ICD-10-CM | POA: Diagnosis not present

## 2021-08-15 DIAGNOSIS — Z992 Dependence on renal dialysis: Secondary | ICD-10-CM | POA: Diagnosis not present

## 2021-08-17 DIAGNOSIS — N186 End stage renal disease: Secondary | ICD-10-CM | POA: Diagnosis not present

## 2021-08-17 DIAGNOSIS — Z7689 Persons encountering health services in other specified circumstances: Secondary | ICD-10-CM | POA: Diagnosis not present

## 2021-08-17 DIAGNOSIS — Z992 Dependence on renal dialysis: Secondary | ICD-10-CM | POA: Diagnosis not present

## 2021-08-19 DIAGNOSIS — N186 End stage renal disease: Secondary | ICD-10-CM | POA: Diagnosis not present

## 2021-08-19 DIAGNOSIS — Z992 Dependence on renal dialysis: Secondary | ICD-10-CM | POA: Diagnosis not present

## 2021-08-19 DIAGNOSIS — Z7689 Persons encountering health services in other specified circumstances: Secondary | ICD-10-CM | POA: Diagnosis not present

## 2021-08-22 DIAGNOSIS — Z992 Dependence on renal dialysis: Secondary | ICD-10-CM | POA: Diagnosis not present

## 2021-08-22 DIAGNOSIS — N186 End stage renal disease: Secondary | ICD-10-CM | POA: Diagnosis not present

## 2021-08-22 DIAGNOSIS — Z7689 Persons encountering health services in other specified circumstances: Secondary | ICD-10-CM | POA: Diagnosis not present

## 2021-08-24 DIAGNOSIS — N186 End stage renal disease: Secondary | ICD-10-CM | POA: Diagnosis not present

## 2021-08-24 DIAGNOSIS — Z992 Dependence on renal dialysis: Secondary | ICD-10-CM | POA: Diagnosis not present

## 2021-08-24 DIAGNOSIS — Z7689 Persons encountering health services in other specified circumstances: Secondary | ICD-10-CM | POA: Diagnosis not present

## 2021-08-26 DIAGNOSIS — N186 End stage renal disease: Secondary | ICD-10-CM | POA: Diagnosis not present

## 2021-08-26 DIAGNOSIS — Z7689 Persons encountering health services in other specified circumstances: Secondary | ICD-10-CM | POA: Diagnosis not present

## 2021-08-26 DIAGNOSIS — Z992 Dependence on renal dialysis: Secondary | ICD-10-CM | POA: Diagnosis not present

## 2021-08-26 DIAGNOSIS — J439 Emphysema, unspecified: Secondary | ICD-10-CM | POA: Diagnosis not present

## 2021-08-28 ENCOUNTER — Other Ambulatory Visit (HOSPITAL_COMMUNITY): Payer: Self-pay

## 2021-08-28 ENCOUNTER — Inpatient Hospital Stay (HOSPITAL_COMMUNITY): Payer: Medicare Other

## 2021-08-28 ENCOUNTER — Ambulatory Visit (INDEPENDENT_AMBULATORY_CARE_PROVIDER_SITE_OTHER): Payer: Medicare Other | Admitting: Clinical

## 2021-08-28 ENCOUNTER — Other Ambulatory Visit: Payer: Self-pay

## 2021-08-28 DIAGNOSIS — F3341 Major depressive disorder, recurrent, in partial remission: Secondary | ICD-10-CM

## 2021-08-28 DIAGNOSIS — D649 Anemia, unspecified: Secondary | ICD-10-CM

## 2021-08-28 NOTE — Progress Notes (Signed)
Virtual Visit via Telephone Note   I connected with Kara Knapp on 08/28/2021 at 2:00 PM EDT by telephone and verified that I am speaking with the correct person using two identifiers.   Location: Patient: Home Provider: Office    I discussed the limitations of evaluation and management by telemedicine and the availability of in person appointments. The patient expressed understanding and agreed to proceed.       THERAPIST PROGRESS NOTE   Session Time: 2:00 PM-2:30 PM   Participation Level: Active   Behavioral Response: CasualAlertDepressed   Type of Therapy: Individual Therapy   Treatment Goals addressed: Coping   Interventions: CBT   Summary: Kara Knapp is a 51 y.o. female who presents with Depression. The OPT therapist worked with the patient for her ongoing OPT treatment session. The OPT therapist utilized Motivational Interviewing to assist in creating therapeutic repore. The patient in the session was engaged and work in collaboration giving feedback about her triggers and symptoms over the course of the past few weeks. The patient spoke about adjusting to having a new aid.The OPT therapist utilized Cognitive Behavioral Therapy through cognitive restructuring as well as worked with the patient on coping strategies to assist in management of mood and as she continues to work on her interactions with others. The patient reported the she continues to work on implementing positive thinking, self esteem, and staying active as well as being aware of and consistent in keeping her health care appointments.    Suicidal/Homicidal: Nowithout intent/plan   Therapist Response: The OPT therapist worked with the patient for the patients scheduled session. The patient was engaged in his session and gave feedback in relation to triggers, symptoms, and behavior responses over the past few weeks. The OPT therapist worked with the patient utilizing an in session Cognitive Behavioral Therapy  exercise. The patient was responsive in the session and verbalized, " My new aid is working out pretty good".The OPT therapist worked with the patient on implementing positive thinking and reviewing upcoming care appointments. The OPT therapist gauged the patents mood through scale measuring within the session. The patient spoke about ongoing work on her own short term goals in areas of housing and finanaces, and continuing to focus on her health including decreasing her smoking. The OPT therapist continued to provide support/encouragement and worked with the patient on staying motivated and using coping skills. The patient spoke about having a new friend she is currently talking with.The patient spoke about and was encourage to be consistent with her sleep The OPT therapist will continue treatment work with the patient in her next scheduled session.   Plan: Return again in 3 weeks.   Diagnosis:      Axis I: MDD (major depressive disorder), recurrent, in partial remission                              Axis II: No diagnosis   I discussed the assessment and treatment plan with the patient. The patient was provided an opportunity to ask questions and all were answered. The patient agreed with the plan and demonstrated an understanding of the instructions.   The patient was advised to call back or seek an in-person evaluation if the symptoms worsen or if the condition fails to improve as anticipated.   I provided 30 minutes of non-face-to-face time during this encounter.   Lennox Grumbles, LCSW   08/28/2021

## 2021-08-29 DIAGNOSIS — N186 End stage renal disease: Secondary | ICD-10-CM | POA: Diagnosis not present

## 2021-08-29 DIAGNOSIS — Z992 Dependence on renal dialysis: Secondary | ICD-10-CM | POA: Diagnosis not present

## 2021-08-29 DIAGNOSIS — Z7689 Persons encountering health services in other specified circumstances: Secondary | ICD-10-CM | POA: Diagnosis not present

## 2021-08-31 ENCOUNTER — Telehealth: Payer: Self-pay

## 2021-08-31 ENCOUNTER — Other Ambulatory Visit: Payer: Self-pay | Admitting: Psychiatry

## 2021-08-31 DIAGNOSIS — N186 End stage renal disease: Secondary | ICD-10-CM | POA: Diagnosis not present

## 2021-08-31 DIAGNOSIS — Z7689 Persons encountering health services in other specified circumstances: Secondary | ICD-10-CM | POA: Diagnosis not present

## 2021-08-31 DIAGNOSIS — Z992 Dependence on renal dialysis: Secondary | ICD-10-CM | POA: Diagnosis not present

## 2021-08-31 NOTE — Telephone Encounter (Signed)
received fax requesting mirtazapine

## 2021-08-31 NOTE — Telephone Encounter (Signed)
Decline- there should be one refill left according to the database. Please verify it with the pharmacy.

## 2021-08-31 NOTE — Telephone Encounter (Signed)
received fax requesting a refill on the zolpidem

## 2021-09-02 DIAGNOSIS — N186 End stage renal disease: Secondary | ICD-10-CM | POA: Diagnosis not present

## 2021-09-02 DIAGNOSIS — Z7689 Persons encountering health services in other specified circumstances: Secondary | ICD-10-CM | POA: Diagnosis not present

## 2021-09-02 DIAGNOSIS — Z992 Dependence on renal dialysis: Secondary | ICD-10-CM | POA: Diagnosis not present

## 2021-09-04 ENCOUNTER — Ambulatory Visit (HOSPITAL_COMMUNITY): Payer: Medicare Other | Admitting: Physician Assistant

## 2021-09-05 DIAGNOSIS — Z992 Dependence on renal dialysis: Secondary | ICD-10-CM | POA: Diagnosis not present

## 2021-09-05 DIAGNOSIS — Z7689 Persons encountering health services in other specified circumstances: Secondary | ICD-10-CM | POA: Diagnosis not present

## 2021-09-05 DIAGNOSIS — N186 End stage renal disease: Secondary | ICD-10-CM | POA: Diagnosis not present

## 2021-09-07 DIAGNOSIS — Z7689 Persons encountering health services in other specified circumstances: Secondary | ICD-10-CM | POA: Diagnosis not present

## 2021-09-07 DIAGNOSIS — N186 End stage renal disease: Secondary | ICD-10-CM | POA: Diagnosis not present

## 2021-09-07 DIAGNOSIS — Z992 Dependence on renal dialysis: Secondary | ICD-10-CM | POA: Diagnosis not present

## 2021-09-11 ENCOUNTER — Encounter (INDEPENDENT_AMBULATORY_CARE_PROVIDER_SITE_OTHER): Payer: Medicare Other | Admitting: Ophthalmology

## 2021-09-12 DIAGNOSIS — Z7689 Persons encountering health services in other specified circumstances: Secondary | ICD-10-CM | POA: Diagnosis not present

## 2021-09-12 DIAGNOSIS — Z992 Dependence on renal dialysis: Secondary | ICD-10-CM | POA: Diagnosis not present

## 2021-09-12 DIAGNOSIS — N186 End stage renal disease: Secondary | ICD-10-CM | POA: Diagnosis not present

## 2021-09-13 DIAGNOSIS — N186 End stage renal disease: Secondary | ICD-10-CM | POA: Diagnosis not present

## 2021-09-13 DIAGNOSIS — E1122 Type 2 diabetes mellitus with diabetic chronic kidney disease: Secondary | ICD-10-CM | POA: Diagnosis not present

## 2021-09-13 DIAGNOSIS — Z299 Encounter for prophylactic measures, unspecified: Secondary | ICD-10-CM | POA: Diagnosis not present

## 2021-09-13 DIAGNOSIS — E1165 Type 2 diabetes mellitus with hyperglycemia: Secondary | ICD-10-CM | POA: Diagnosis not present

## 2021-09-13 DIAGNOSIS — J439 Emphysema, unspecified: Secondary | ICD-10-CM | POA: Diagnosis not present

## 2021-09-14 DIAGNOSIS — Z7689 Persons encountering health services in other specified circumstances: Secondary | ICD-10-CM | POA: Diagnosis not present

## 2021-09-14 DIAGNOSIS — N186 End stage renal disease: Secondary | ICD-10-CM | POA: Diagnosis not present

## 2021-09-14 DIAGNOSIS — Z992 Dependence on renal dialysis: Secondary | ICD-10-CM | POA: Diagnosis not present

## 2021-09-15 ENCOUNTER — Inpatient Hospital Stay (HOSPITAL_COMMUNITY): Payer: Medicare Other | Attending: Hematology
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
## 2021-09-16 DIAGNOSIS — Z992 Dependence on renal dialysis: Secondary | ICD-10-CM | POA: Diagnosis not present

## 2021-09-16 DIAGNOSIS — Z7689 Persons encountering health services in other specified circumstances: Secondary | ICD-10-CM | POA: Diagnosis not present

## 2021-09-16 DIAGNOSIS — N186 End stage renal disease: Secondary | ICD-10-CM | POA: Diagnosis not present

## 2021-09-18 ENCOUNTER — Inpatient Hospital Stay (HOSPITAL_COMMUNITY): Payer: Medicare Other | Admitting: Physician Assistant

## 2021-09-19 ENCOUNTER — Encounter: Payer: Self-pay | Admitting: Psychiatry

## 2021-09-19 ENCOUNTER — Other Ambulatory Visit: Payer: Self-pay

## 2021-09-19 ENCOUNTER — Telehealth (INDEPENDENT_AMBULATORY_CARE_PROVIDER_SITE_OTHER): Payer: Medicare Other | Admitting: Psychiatry

## 2021-09-19 DIAGNOSIS — G4700 Insomnia, unspecified: Secondary | ICD-10-CM

## 2021-09-19 DIAGNOSIS — Z992 Dependence on renal dialysis: Secondary | ICD-10-CM | POA: Diagnosis not present

## 2021-09-19 DIAGNOSIS — F3341 Major depressive disorder, recurrent, in partial remission: Secondary | ICD-10-CM | POA: Diagnosis not present

## 2021-09-19 DIAGNOSIS — Z7689 Persons encountering health services in other specified circumstances: Secondary | ICD-10-CM | POA: Diagnosis not present

## 2021-09-19 DIAGNOSIS — N186 End stage renal disease: Secondary | ICD-10-CM | POA: Diagnosis not present

## 2021-09-19 MED ORDER — ARIPIPRAZOLE 5 MG PO TABS: 5.0000 mg | ORAL_TABLET | Freq: Every day | ORAL | 0 refills | Status: AC

## 2021-09-19 MED ORDER — MIRTAZAPINE 7.5 MG PO TABS: 7.5000 mg | ORAL_TABLET | Freq: Every day | ORAL | 0 refills | Status: AC

## 2021-09-19 NOTE — Patient Instructions (Signed)
Continue sertraline 150 mg daily Continue Abilify 10 mg at night  Continue mirtazapine 7.5 mg at night  Dictoninue Ambien   Next appointment: 3/30 at 1:40

## 2021-09-20 ENCOUNTER — Ambulatory Visit (INDEPENDENT_AMBULATORY_CARE_PROVIDER_SITE_OTHER): Payer: Medicare Other | Admitting: Clinical

## 2021-09-20 ENCOUNTER — Other Ambulatory Visit: Payer: Self-pay

## 2021-09-20 DIAGNOSIS — F3341 Major depressive disorder, recurrent, in partial remission: Secondary | ICD-10-CM | POA: Diagnosis not present

## 2021-09-20 NOTE — Progress Notes (Signed)
Virtual Visit via Telephone Note   I connected with Kara Knapp on 09/20/2021 at 2:00 PM EDT by telephone and verified that I am speaking with the correct person using two identifiers.   Location: Patient: Home Provider: Office    I discussed the limitations of evaluation and management by telemedicine and the availability of in person appointments. The patient expressed understanding and agreed to proceed.       THERAPIST PROGRESS NOTE   Session Time: 2:00 PM-2:25 PM   Participation Level: Active   Behavioral Response: CasualAlertDepressed   Type of Therapy: Individual Therapy   Treatment Goals addressed: Coping   Interventions: CBT   Summary: Kara Knapp is a 51 y.o. female who presents with Depression. The OPT therapist worked with the patient for her ongoing OPT treatment session. The OPT therapist utilized Motivational Interviewing to assist in creating therapeutic repore. The patient in the session was engaged and work in collaboration giving feedback about her triggers and symptoms over the course of the past few weeks. The patient is currently under the weather and taking a antibiotic. The patient spoke about prior visit with psychiatrist Dr. Modesta Messing who made a change with the patients sleep aid medication and the patient reports this is currently working well.The patient spoke about  having a new boyfriend/man. The patient spoke about her ongoing dialysis 3x per week and a upcoming eye appointment in [place] next week. The OPT therapist utilized Cognitive Behavioral Therapy through cognitive restructuring as well as worked with the patient on coping strategies to assist in management of mood and as she continues to work on her interactions with others. The patient reported the she continues to work on implementing positive thinking, self esteem, and staying active as well as being aware of and consistent in keeping her health care appointments.    Suicidal/Homicidal:  Nowithout intent/plan   Therapist Response: The OPT therapist worked with the patient for the patients scheduled session. The patient was engaged in his session and gave feedback in relation to triggers, symptoms, and behavior responses over the past few weeks. The OPT therapist worked with the patient utilizing an in session Cognitive Behavioral Therapy exercise. The patient was responsive in the session and verbalized, " Things overall are going pretty good I got tricked into getting outside more with this warm weather next thing you know I woke up sick so I am taking a antibiotic I got 4 more days on it".The OPT therapist worked with the patient on implementing positive thinking and reviewing  the recent medication therapy appointment as well as upcoming care appointments. The OPT therapist gauged the patents mood through scale measuring within the session. The patient spoke about ongoing work on her own short term goals in areas of housing and finanaces, and continuing to focus on her health. The OPT therapist continued to provide support/encouragement and worked with the patient on staying motivated and using coping skills. The patient spoke about having a new boyfriend currently staying with her keeping her company.The patient spoke about the medication change noting it is helping her currently with sleep. The OPT therapist will continue treatment work with the patient in her next scheduled session.   Plan: Return again in 3 weeks.   Diagnosis:      Axis I: MDD (major depressive disorder), recurrent, in partial remission  Axis II: No diagnosis    Collaboration of Care: Collaboration of Care in review of patient involvement with psychiatrist Dr. Modesta Messing   Patient/Guardian was advised Release of Information must be obtained prior to any record release in order to collaborate their care with an outside provider. Patient/Guardian was advised if they have not already done so  to contact the registration department to sign all necessary forms in order for [place] to release information regarding their care.    Consent: Patient/Guardian gives verbal consent for treatment and assignment of benefits for services provided during this visit. Patient/Guardian expressed understanding and agreed to proceed.       I discussed the assessment and treatment plan with the patient. The patient was provided an opportunity to ask questions and all were answered. The patient agreed with the plan and demonstrated an understanding of the instructions.   The patient was advised to call back or seek an in-person evaluation if the symptoms worsen or if the condition fails to improve as anticipated.   I provided 25 minutes of non-face-to-face time during this encounter.   Lennox Grumbles, LCSW   09/20/2021

## 2021-09-21 DIAGNOSIS — Z992 Dependence on renal dialysis: Secondary | ICD-10-CM | POA: Diagnosis not present

## 2021-09-21 DIAGNOSIS — Z7689 Persons encountering health services in other specified circumstances: Secondary | ICD-10-CM | POA: Diagnosis not present

## 2021-09-21 DIAGNOSIS — N186 End stage renal disease: Secondary | ICD-10-CM | POA: Diagnosis not present

## 2021-09-23 DIAGNOSIS — Z7689 Persons encountering health services in other specified circumstances: Secondary | ICD-10-CM | POA: Diagnosis not present

## 2021-09-23 DIAGNOSIS — N186 End stage renal disease: Secondary | ICD-10-CM | POA: Diagnosis not present

## 2021-09-23 DIAGNOSIS — Z992 Dependence on renal dialysis: Secondary | ICD-10-CM | POA: Diagnosis not present

## 2021-09-25 ENCOUNTER — Ambulatory Visit (INDEPENDENT_AMBULATORY_CARE_PROVIDER_SITE_OTHER): Payer: Medicare Other | Admitting: Ophthalmology

## 2021-09-25 ENCOUNTER — Other Ambulatory Visit: Payer: Self-pay

## 2021-09-25 ENCOUNTER — Encounter (INDEPENDENT_AMBULATORY_CARE_PROVIDER_SITE_OTHER): Payer: Self-pay | Admitting: Ophthalmology

## 2021-09-25 DIAGNOSIS — H4720 Unspecified optic atrophy: Secondary | ICD-10-CM

## 2021-09-25 DIAGNOSIS — H211X1 Other vascular disorders of iris and ciliary body, right eye: Secondary | ICD-10-CM

## 2021-09-25 DIAGNOSIS — H4051X3 Glaucoma secondary to other eye disorders, right eye, severe stage: Secondary | ICD-10-CM | POA: Diagnosis not present

## 2021-09-25 DIAGNOSIS — E113511 Type 2 diabetes mellitus with proliferative diabetic retinopathy with macular edema, right eye: Secondary | ICD-10-CM | POA: Diagnosis not present

## 2021-09-25 DIAGNOSIS — Z7689 Persons encountering health services in other specified circumstances: Secondary | ICD-10-CM | POA: Diagnosis not present

## 2021-09-25 NOTE — Assessment & Plan Note (Signed)
4 months post most recent Avastin, no signs of recurrence

## 2021-09-25 NOTE — Progress Notes (Signed)
09/25/2021     CHIEF COMPLAINT Patient presents for  Chief Complaint  Patient presents with   Diabetic Retinopathy without Macular Edema      HISTORY OF PRESENT ILLNESS: Kara Knapp is a 51 y.o. female who presents to the clinic today for:   HPI   4 mos fu ou oct. Patient states vision is stable and unchanged since last visit. Denies any new floaters or FOL. Pt uses Combigan QHS OU. Last edited by Laurin Coder on 09/25/2021  1:40 PM.      Referring physician: Medicine, [facility] [place],  [place] [id number]  HISTORICAL INFORMATION:   Selected notes from the MEDICAL RECORD NUMBER    Lab Results  Component Value Date   HGBA1C 6.3 (H) 01/01/2021     CURRENT MEDICATIONS: Current Outpatient Medications (Ophthalmic Drugs)  Medication Sig   COMBIGAN 0.2-0.5 % ophthalmic solution INSTILL ONE DROP IN [facility] EYE TWICE DAILY (Patient taking differently: Place 1 drop into both eyes at bedtime.)   No current facility-administered medications for this visit. (Ophthalmic Drugs)   Current Outpatient Medications (Other)  Medication Sig   acetaminophen (TYLENOL) 500 MG tablet Take 1,000 mg by mouth every 6 (six) hours as needed for moderate pain or headache.   ARIPiprazole (ABILIFY) 5 MG tablet Take 1 tablet (5 mg total) by mouth daily.   carvedilol (COREG) 12.5 MG tablet Take 1 tablet (12.5 mg total) by mouth 2 (two) times daily.   cholecalciferol (VITAMIN D) 25 MCG (1000 UNIT) tablet Take 1,000 Units by mouth daily.   Continuous Blood Gluc Sensor (FREESTYLE LIBRE 14 DAY SENSOR) MISC Inject 1 each into the skin every 14 (fourteen) days. Use as directed.   cyclobenzaprine (FLEXERIL) 10 MG tablet Take 10 mg by mouth 2 (two) times daily as needed for muscle spasms.   dexamethasone (DECADRON) 4 MG tablet Take 1 tablet (4 mg total) by mouth daily.   fluticasone (FLONASE) 50 MCG/ACT nasal spray Place 2 sprays into both nostrils daily as needed for allergies or rhinitis.    gabapentin (NEURONTIN) 100 MG capsule Take 1 capsule (100 mg total) by mouth 3 (three) times daily. (Needs to be seen before next refill) (Patient taking differently: Take 100 mg by mouth 3 (three) times daily.)   glucose blood (PRODIGY NO CODING BLOOD GLUC) test strip USE TO CHECK BLOOD SUGAR TWICE DAILY. Dx E11.22   hydrALAZINE (APRESOLINE) 50 MG tablet Take 1 tablet (50 mg total) by mouth 2 (two) times daily.   Insulin Pen Needle (TRUEPLUS PEN NEEDLES) 31G X 5 MM MISC Use daily with insulin Dx E11.22   ipratropium-albuterol (DUONEB) 0.5-2.5 (3) MG/3ML SOLN 3 mLs every 4 (four) hours as needed (Wheezing).   LANTUS SOLOSTAR 100 UNIT/ML Solostar Pen INJECT 18-50 UNITS SUBCUTANEOUSLY AT BEDTIME. (Patient taking differently: Inject 20 Units into the skin at bedtime.)   lidocaine-prilocaine (EMLA) cream Apply 1 application topically Every Tuesday,Thursday,and Saturday with dialysis.   linagliptin (TRADJENTA) 5 MG TABS tablet Take 1 tablet (5 mg total) by mouth daily. (Patient taking differently: Take 5 mg by mouth every evening.)   [START ON 10/09/2021] mirtazapine (REMERON) 7.5 MG tablet Take 1 tablet (7.5 mg total) by mouth at bedtime.   multivitamin (RENA-VIT) TABS tablet Take 1 tablet by mouth daily.   OXYGEN Inhale 2 L into the lungs daily as needed (Wheezing).   pantoprazole (PROTONIX) 40 MG tablet Take 40 mg by mouth daily.   polyethylene glycol-electrolytes (TRILYTE) 420 g solution Take  4,000 mLs by mouth as directed.   PRODIGY TWIST TOP LANCETS 28G MISC USE TO CHECK BLOOD SUGAR UP TO FOUR TIMES DAILY.   sertraline (ZOLOFT) 100 MG tablet Take 1.5 tablets (150 mg total) by mouth daily. (Patient taking differently: Take 150 mg by mouth at bedtime.)   sevelamer carbonate (RENVELA) 800 MG tablet Take 800-2,400 mg by mouth See admin instructions. Take 2400 mg by mouth three times daily with means and 1600 mg with snacks   SYMBICORT 160-4.5 MCG/ACT inhaler Inhale 2 puffs into the lungs 2 (two) times  daily. (Patient taking differently: Inhale 2 puffs into the lungs daily.)   VENTOLIN HFA 108 (90 Base) MCG/ACT inhaler Inhale 2 puffs into the lungs every 4 (four) hours as needed for wheezing or shortness of breath.   No current facility-administered medications for this visit. (Other)      REVIEW OF SYSTEMS: ROS   Negative for: Constitutional, Gastrointestinal, Neurological, Skin, Genitourinary, Musculoskeletal, HENT, Endocrine, Cardiovascular, Eyes, Respiratory, Psychiatric, Allergic/Imm, Heme/Lymph Last edited by Hurman Horn, MD on 09/25/2021  2:31 PM.       ALLERGIES Allergies  Allergen Reactions   Bactrim [Sulfamethoxazole-Trimethoprim] Nausea And Vomiting   Prednisone Other (See Comments)    "I was wide open and couldn't eat" per pt. Loss of appetite and insomnia  LOSS OF APPETITE,UNABLE TO SLEEP    PAST MEDICAL HISTORY Past Medical History:  Diagnosis Date   Anemia of chronic disease    Anxiety    Asthma    Blind left eye    Bronchitis    Cataract    Cholecystitis, acute 05/26/2013   Status post cholecystectomy   Chronic abdominal pain    Chronic diarrhea    COPD (chronic obstructive pulmonary disease) (HCC)    Depression    Diabetic foot ulcer ([place]) 56/31/4970   Diastolic heart failure (HCC)    ESRD on hemodialysis ([place])    Started diaylsis 12/29/15   Essential hypertension    Fatty liver    Fibroids    GERD (gastroesophageal reflux disease)    Glaucoma    History of blood transfusion    History of pneumonia    Hyperlipidemia    Insulin-dependent diabetes mellitus with retinopathy    Liver fibrosis    Negative Hep B surface antigen, negative Hep C antibody Feb 2018 (see scanned in labs).   Neuropathy    Osteomyelitis (HCC)    Toe on left foot   Past Surgical History:  Procedure Laterality Date   A/V SHUNTOGRAM N/A 10/25/2016   Procedure: A/V Shuntogram - Right Arm;  Surgeon: Waynetta Sandy, MD;  Location: [place] [facility];  Service:  Cardiovascular;  Laterality: N/A;   A/V SHUNTOGRAM N/A 03/05/2018   Procedure: A/V SHUNTOGRAM - Right Arm;  Surgeon: Waynetta Sandy, MD;  Location: [place] [facility];  Service: Cardiovascular;  Laterality: N/A;   A/V SHUNTOGRAM Left 03/15/2021   Procedure: A/V SHUNTOGRAM;  Surgeon: Cherre Robins, MD;  Location: [place] [facility];  Service: Cardiovascular;  Laterality: Left;   A/V SHUNTOGRAM N/A 06/30/2021   Procedure: A/V SHUNTOGRAM;  Surgeon: Cherre Robins, MD;  Location: [place] [facility];  Service: Cardiovascular;  Laterality: N/A;   AV FISTULA PLACEMENT Right 10/17/2015   Procedure: INSERTION OF ARTERIOVENOUS GORE-TEX GRAFT RIGHT UPPER ARM WITH ACUSEAL;  Surgeon: Conrad Cave, MD;  Location: [place];  Service: Vascular;  Laterality: Right;   AV FISTULA PLACEMENT Left 11/18/2019   Procedure: INSERTION OF ARTERIOVENOUS (  AV) GORE-TEX GRAFT left  ARM;  Surgeon: Serafina Mitchell, MD;  Location: [place];  Service: Vascular;  Laterality: Left;   BIOPSY  01/02/2021   Procedure: BIOPSY;  Surgeon: Eloise Harman, DO;  [facility];  Service: Endoscopy;;   CATARACT EXTRACTION W/ INTRAOCULAR LENS IMPLANT Bilateral    CESAREAN SECTION     CHOLECYSTECTOMY N/A 05/25/2013   Procedure: LAPAROSCOPIC CHOLECYSTECTOMY;  Surgeon: Jamesetta So, MD;  Location: [facility];  Service: General;  Laterality: N/A;   COLONOSCOPY WITH PROPOFOL N/A 10/02/2016   two 3 to 5 mm polyps in the descending colon, three 2 to 3 mm polyps in the rectum, random colon biopsies, rectal bleeding due to internal hemorrhoids, friability with no bleeding at the anus status post biopsy.  Surgical pathology found the polyps to be a mix of hyperplastic and tubular adenoma, random colon biopsies to be benign colonic mucosa, and the anal biopsies to be anal skin tag.    COLONOSCOPY WITH PROPOFOL N/A 03/10/2021   Procedure: COLONOSCOPY WITH PROPOFOL;  Surgeon: Eloise Harman, DO;  [facility];  Service: Endoscopy;   Laterality: N/A;  11:30am, pt is on dialysis - pt can not come earlier, riding [facility]   ESOPHAGOGASTRODUODENOSCOPY (EGD) WITH PROPOFOL N/A 10/02/2016   mucosal nodule in the esophagus status post biopsy, moderate gastritis status post biopsy, mild duodenitis. esophageal biopsy to be benign, gastric biopsies to be gastritis due to aspirin use, and duodenal biopsies to be duodenitis due to aspirin use   ESOPHAGOGASTRODUODENOSCOPY (EGD) WITH PROPOFOL N/A 01/02/2021   Procedure: ESOPHAGOGASTRODUODENOSCOPY (EGD) WITH PROPOFOL;  Surgeon: Eloise Harman, DO;  [facility];  Service: Endoscopy;  Laterality: N/A;   EXCISION OF MESH Left 03/27/2021   Procedure: EXCISION OF  ARTERIOVENOUS GRAFT PSUEDOANUERYSM;  Surgeon: Cherre Robins, MD;  Location: [place];  Service: Vascular;  Laterality: Left;   EYE SURGERY Bilateral    INSERTION OF DIALYSIS CATHETER  03/27/2021   Procedure: ULTRASOUND GUIDED INSERTION OF TUNNELED DIALYSIS CATHETER;  Surgeon: Cherre Robins, MD;  Location: [place];  Service: Vascular;;   IR FLUORO GUIDE CV LINE RIGHT  10/09/2019   IR REMOVAL TUN CV CATH W/O FL  01/15/2020   IR THROMBECTOMY AV FISTULA W/THROMBOLYSIS/PTA INC/SHUNT/IMG RIGHT Right 12/26/2018   IR THROMBECTOMY AV FISTULA W/THROMBOLYSIS/PTA INC/SHUNT/IMG RIGHT Right 08/10/2019   IR THROMBECTOMY AV FISTULA W/THROMBOLYSIS/PTA/STENT INC/SHUNT/IMG RT Right 08/08/2018   IR THROMBECTOMY AV FISTULA W/THROMBOLYSIS/PTA/STENT INC/SHUNT/IMG RT Right 05/15/2019   IR US GUIDE VASC ACCESS RIGHT  08/08/2018   IR US GUIDE VASC ACCESS RIGHT  12/26/2018   IR US GUIDE VASC ACCESS RIGHT  05/15/2019   IR US GUIDE VASC ACCESS RIGHT  08/10/2019   IR US GUIDE VASC ACCESS RIGHT  10/09/2019   PARS PLANA VITRECTOMY Left 11/24/2014   Procedure: PARS PLANA VITRECTOMY WITH 25 GAUGE;  Surgeon: Hurman Horn, MD;  Location: [place];  Service: Ophthalmology;  Laterality: Left;   PERIPHERAL VASCULAR BALLOON ANGIOPLASTY Right 03/05/2018   Procedure: PERIPHERAL  VASCULAR BALLOON ANGIOPLASTY;  Surgeon: Waynetta Sandy, MD;  Location: [place] [facility];  Service: Cardiovascular;  Laterality: Right;  arm fistula   PERIPHERAL VASCULAR BALLOON ANGIOPLASTY  06/30/2021   Procedure: PERIPHERAL VASCULAR BALLOON ANGIOPLASTY;  Surgeon: Cherre Robins, MD;  Location: [place] [facility];  Service: Cardiovascular;;   PERIPHERAL VASCULAR CATHETERIZATION N/A 04/28/2015   Procedure: Bilateral Upper Extremity Venography;  Surgeon: Conrad Salineno North, MD;  Location: [place]  [facility];  Service: Cardiovascular;  Laterality: N/A;   PHOTOCOAGULATION WITH LASER Left 11/24/2014   Procedure: PHOTOCOAGULATION WITH LASER;  Surgeon: Hurman Horn, MD;  Location: [facility];  Service: Ophthalmology;  Laterality: Left;  with insertion of silicone oil   POLYPECTOMY  03/10/2021   Procedure: POLYPECTOMY INTESTINAL;  Surgeon: Eloise Harman, DO;  [facility];  Service: Endoscopy;;   REVISION OF ARTERIOVENOUS GORETEX GRAFT Left 03/27/2021   Procedure: LEFT ARM ARTERIOVENOUS GORETEX GRAFT PLACEMENT;  Surgeon: Cherre Robins, MD;  Location: [place];  Service: Vascular;  Laterality: Left;  PERIPHERAL NERVE BLOCK   SAVORY DILATION N/A 10/02/2016   Procedure: SAVORY DILATION;  Surgeon: Danie Binder, MD;  [facility];  Service: Endoscopy;  Laterality: N/A;   TUBAL LIGATION     UPPER EXTREMITY VENOGRAPHY Bilateral 11/02/2019   Procedure: UPPER EXTREMITY VENOGRAPHY;  Surgeon: Waynetta Sandy, MD;  Location: [place] [facility];  Service: Cardiovascular;  Laterality: Bilateral;    FAMILY HISTORY Family History  Problem Relation Age of Onset   COPD Mother    Cancer Father    Lymphoma Father    Diabetes Sister    Deep vein thrombosis Sister    Diabetes Brother    Hyperlipidemia Brother    Hypertension Brother    Mental retardation Sister    Alcohol abuse Paternal Grandmother    Colon cancer Neg Hx    Liver disease Neg Hx     SOCIAL HISTORY Social  History   Tobacco Use   Smoking status: Every Day    Packs/day: 1.00    Years: 23.00    Pack years: 23.00    Types: Cigarettes    Start date: 12/03/2000   Smokeless tobacco: Never   Tobacco comments:    one pack daily  Vaping Use   Vaping Use: Never used  Substance Use Topics   Alcohol use: No    Alcohol/week: 0.0 standard drinks   Drug use: No    Comment: Sober for 8 years         OPHTHALMIC EXAM:  Base Eye Exam     Visual Acuity (ETDRS)       Right Left   Dist [place] 20/200 LP   Dist ph [place] 20/160 -1          Tonometry (Tonopen, 1:43 PM)       Right Left   Pressure 25 36         Pupils       Dark Light Shape React APD   Right 4 4 Round Minimal None   Left   Irregular  None         Visual Fields (Counting fingers)       Left Right   Restrictions Total superior temporal, inferior temporal, superior nasal, inferior nasal deficiencies Partial outer superior temporal, inferior temporal, superior nasal, inferior nasal deficiencies         Extraocular Movement       Right Left    Full Full         Neuro/Psych     Oriented x3: Yes   Mood/Affect: Normal         Dilation     Both eyes: 1.0% Mydriacyl, 2.5% Phenylephrine @ 1:46 PM           Slit Lamp and Fundus Exam     External Exam       Right Left   External Normal Normal  Slit Lamp Exam       Right Left   Lids/Lashes Normal Normal   Conjunctiva/Sclera White and quiet White and quiet   Cornea Clear Clear   Anterior Chamber Tube, ST Quad Deep and quiet, glaucoma tube AC   Iris Round and reactive, old minor NVI and inactive Old and active NVI   Lens Posterior chamber intraocular lens, Open posterior capsule Posterior chamber intraocular lens   Anterior Vitreous clear  Silicone oil         Fundus Exam       Right Left   Posterior Vitreous Vitrectomized , clear Clear silicone   Disc Old fibrovascular disease of the nerve, 3+ Pallor White nerve   C/D Ratio 0.3  Atrophic   Macula Microaneurysms, no exudates, Focal laser scars, no macular thickening Attached   Vessels PDR-quiet PDR-quiet   Periphery Laser scars,360 Laser scars,            IMAGING AND PROCEDURES  Imaging and Procedures for 09/25/21  OCT, Retina - OU - Both Eyes       Right Eye Quality was good. Scan locations included subfoveal. Central Foveal Thickness: 231. Progression has improved. Findings include abnormal foveal contour.   Left Eye Quality was borderline. Scan locations included temporal. Central Foveal Thickness: 167. Progression has been stable. Findings include abnormal foveal contour.   Notes History of CSME OD, stabilized and improved on recurrent intravitreal Avastin also to prevent recurrent microscopic vitreous hemorrhages in this monocular patient  OD currently at 57-monthinterval post last injection, thus 2.5 months since last treatment effect             ASSESSMENT/PLAN:  Rubeosis iridis of right eye 4 months post most recent Avastin, no signs of recurrence  Diabetic macular edema of right eye with proliferative retinopathy associated with type 2 diabetes mellitus (HCC) No active macular edema, good PRP peripherally into the posterior pole, no signs of active NVE, diffuse macular atrophy  Secondary glaucoma due to combination mechanisms, right, severe stage OD moderate elevation intraocular pressure, stable overall we will continue to observe  Optic atrophy, left eye Stable OS accounts for acuity     ICD-10-CM   1. Diabetic macular edema of right eye with proliferative retinopathy associated with type 2 diabetes mellitus (HCC)  E11.3511 OCT, Retina - OU - Both Eyes    2. Rubeosis iridis of right eye  H21.1X1     3. Secondary glaucoma due to combination mechanisms, right, severe stage  H40.51X3     4. Optic atrophy, left eye  H47.20       1.  OD with clear media today, no signs of recurrent hemorrhage.  No signs of active PDR nor CSME.   We will thus monitor and observe follow-up in 2 months.  2.  3.  Ophthalmic Meds Ordered this visit:  No orders of the defined types were placed in this encounter.      Return in about 8 weeks (around 11/20/2021) for DILATE OU, OCT, COLOR FP.  There are no Patient Instructions on file for this visit.   Explained the diagnoses, plan, and follow up with the patient and they expressed understanding.  Patient expressed understanding of the importance of proper follow up care.   GClent DemarkRankin M.D. Diseases & Surgery of the Retina and Vitreous Retina & Diabetic EMont[place]02/27/23     Abbreviations: M myopia (nearsighted); A astigmatism; H hyperopia (farsighted); P presbyopia; Mrx spectacle prescription;  CTL contact lenses; OD  right eye; OS left eye; OU both eyes  XT exotropia; ET esotropia; PEK punctate epithelial keratitis; PEE punctate epithelial erosions; DES dry eye syndrome; MGD meibomian gland dysfunction; ATs artificial tears; PFAT's preservative free artificial tears; [place] nuclear sclerotic cataract; PSC posterior subcapsular cataract; ERM epi-retinal membrane; PVD posterior vitreous detachment; RD retinal detachment; DM diabetes mellitus; DR diabetic retinopathy; NPDR non-proliferative diabetic retinopathy; PDR proliferative diabetic retinopathy; CSME clinically significant macular edema; DME diabetic macular edema; dbh dot blot hemorrhages; CWS cotton wool spot; POAG primary open angle glaucoma; C/D cup-to-disc ratio; HVF humphrey visual field; GVF goldmann visual field; OCT optical coherence tomography; IOP intraocular pressure; BRVO Branch retinal vein occlusion; CRVO central retinal vein occlusion; CRAO central retinal artery occlusion; BRAO branch retinal artery occlusion; RT retinal tear; SB scleral buckle; PPV pars plana vitrectomy; VH Vitreous hemorrhage; PRP panretinal laser photocoagulation; IVK intravitreal kenalog; VMT vitreomacular traction; MH Macular hole;  NVD  neovascularization of the disc; NVE neovascularization elsewhere; AREDS age related eye disease study; ARMD age related macular degeneration; POAG primary open angle glaucoma; EBMD epithelial/anterior basement membrane dystrophy; ACIOL anterior chamber intraocular lens; IOL intraocular lens; PCIOL posterior chamber intraocular lens; Phaco/IOL phacoemulsification with intraocular lens placement; [place] photorefractive keratectomy; LASIK laser assisted in situ keratomileusis; HTN hypertension; DM diabetes mellitus; COPD chronic obstructive pulmonary disease

## 2021-09-25 NOTE — Assessment & Plan Note (Signed)
No active macular edema, good PRP peripherally into the posterior pole, no signs of active NVE, diffuse macular atrophy

## 2021-09-25 NOTE — Assessment & Plan Note (Signed)
Stable OS accounts for acuity

## 2021-09-25 NOTE — Assessment & Plan Note (Signed)
OD moderate elevation intraocular pressure, stable overall we will continue to observe

## 2021-09-26 DIAGNOSIS — J439 Emphysema, unspecified: Secondary | ICD-10-CM | POA: Diagnosis not present

## 2021-09-26 DIAGNOSIS — N186 End stage renal disease: Secondary | ICD-10-CM | POA: Diagnosis not present

## 2021-09-26 DIAGNOSIS — Z7689 Persons encountering health services in other specified circumstances: Secondary | ICD-10-CM | POA: Diagnosis not present

## 2021-09-26 DIAGNOSIS — Z992 Dependence on renal dialysis: Secondary | ICD-10-CM | POA: Diagnosis not present

## 2021-09-28 DIAGNOSIS — Z7689 Persons encountering health services in other specified circumstances: Secondary | ICD-10-CM | POA: Diagnosis not present

## 2021-09-28 DIAGNOSIS — Z992 Dependence on renal dialysis: Secondary | ICD-10-CM | POA: Diagnosis not present

## 2021-09-28 DIAGNOSIS — N186 End stage renal disease: Secondary | ICD-10-CM | POA: Diagnosis not present

## 2021-09-30 DIAGNOSIS — Z992 Dependence on renal dialysis: Secondary | ICD-10-CM | POA: Diagnosis not present

## 2021-09-30 DIAGNOSIS — N186 End stage renal disease: Secondary | ICD-10-CM | POA: Diagnosis not present

## 2021-09-30 DIAGNOSIS — Z7689 Persons encountering health services in other specified circumstances: Secondary | ICD-10-CM | POA: Diagnosis not present

## 2021-10-03 DIAGNOSIS — N186 End stage renal disease: Secondary | ICD-10-CM | POA: Diagnosis not present

## 2021-10-03 DIAGNOSIS — Z992 Dependence on renal dialysis: Secondary | ICD-10-CM | POA: Diagnosis not present

## 2021-10-03 DIAGNOSIS — Z7689 Persons encountering health services in other specified circumstances: Secondary | ICD-10-CM | POA: Diagnosis not present

## 2021-10-05 DIAGNOSIS — N186 End stage renal disease: Secondary | ICD-10-CM | POA: Diagnosis not present

## 2021-10-05 DIAGNOSIS — Z992 Dependence on renal dialysis: Secondary | ICD-10-CM | POA: Diagnosis not present

## 2021-10-05 DIAGNOSIS — Z7689 Persons encountering health services in other specified circumstances: Secondary | ICD-10-CM | POA: Diagnosis not present

## 2021-10-07 DIAGNOSIS — Z7689 Persons encountering health services in other specified circumstances: Secondary | ICD-10-CM | POA: Diagnosis not present

## 2021-10-07 DIAGNOSIS — Z992 Dependence on renal dialysis: Secondary | ICD-10-CM | POA: Diagnosis not present

## 2021-10-07 DIAGNOSIS — N186 End stage renal disease: Secondary | ICD-10-CM | POA: Diagnosis not present

## 2021-10-10 DIAGNOSIS — Z7689 Persons encountering health services in other specified circumstances: Secondary | ICD-10-CM | POA: Diagnosis not present

## 2021-10-10 DIAGNOSIS — N186 End stage renal disease: Secondary | ICD-10-CM | POA: Diagnosis not present

## 2021-10-10 DIAGNOSIS — Z992 Dependence on renal dialysis: Secondary | ICD-10-CM | POA: Diagnosis not present

## 2021-10-12 DIAGNOSIS — Z7689 Persons encountering health services in other specified circumstances: Secondary | ICD-10-CM | POA: Diagnosis not present

## 2021-10-12 DIAGNOSIS — Z992 Dependence on renal dialysis: Secondary | ICD-10-CM | POA: Diagnosis not present

## 2021-10-12 DIAGNOSIS — N186 End stage renal disease: Secondary | ICD-10-CM | POA: Diagnosis not present

## 2021-10-14 DIAGNOSIS — Z992 Dependence on renal dialysis: Secondary | ICD-10-CM | POA: Diagnosis not present

## 2021-10-14 DIAGNOSIS — N186 End stage renal disease: Secondary | ICD-10-CM | POA: Diagnosis not present

## 2021-10-14 DIAGNOSIS — Z7689 Persons encountering health services in other specified circumstances: Secondary | ICD-10-CM | POA: Diagnosis not present

## 2021-10-16 ENCOUNTER — Ambulatory Visit (INDEPENDENT_AMBULATORY_CARE_PROVIDER_SITE_OTHER): Payer: Medicare Other | Admitting: Clinical

## 2021-10-16 ENCOUNTER — Other Ambulatory Visit: Payer: Self-pay

## 2021-10-16 DIAGNOSIS — F3341 Major depressive disorder, recurrent, in partial remission: Secondary | ICD-10-CM | POA: Diagnosis not present

## 2021-10-16 NOTE — Progress Notes (Signed)
Virtual Visit via Telephone Note ?  ?I connected with Kara Knapp on 10/16/2021 at 2:00 PM EDT by telephone and verified that I am speaking with the correct person using two identifiers. ?  ?Location: ?Patient: Home ?Provider: Office  ?  ?I discussed the limitations of evaluation and management by telemedicine and the availability of in person appointments. The patient expressed understanding and agreed to proceed. ?  ?  ?  ?THERAPIST PROGRESS NOTE ?  ?Session Time: 2:00 PM-2:25 PM ?  ?Participation Level: Active ?  ?Behavioral Response: CasualAlertDepressed ?  ?Type of Therapy: Individual Therapy ?  ?Treatment Goals addressed: Coping ?  ?Interventions: CBT ?  ?Summary: Kara Knapp is a 51 y.o. female who presents with Depression. The OPT therapist worked with the patient for her ongoing OPT treatment session. The OPT therapist utilized Motivational Interviewing to assist in creating therapeutic repore. The patient in the session was engaged and work in collaboration giving feedback about her triggers and symptoms over the course of the past few weeks. The patient spoke about recovering from being under the weather and taking antibiotics. The patient spoke about her sleep aid still working well.The patient spoke about  things going well in her relationship. The patient noted she has fully adjusted to her new health aid. The patient spoke about with better weather this week her plans to get outside more.The patient spoke about her ongoing dialysis 3x per week and a upcoming eye appointment in [place] next week. The OPT therapist utilized Cognitive Behavioral Therapy through cognitive restructuring as well as worked with the patient on coping strategies to assist in management of mood and as she continues to work on her interactions with others. The patient reported the she continues to work on implementing positive thinking, self esteem, and staying active as well as being aware of and consistent in  keeping her health care appointments.  ?  ?Suicidal/Homicidal: Nowithout intent/plan ?  ?Therapist Response: The OPT therapist worked with the patient for the patients scheduled session. The patient was engaged in his session and gave feedback in relation to triggers, symptoms, and behavior responses over the past few weeks. The OPT therapist worked with the patient utilizing an in session Cognitive Behavioral Therapy exercise. The patient was responsive in the session and verbalized, " Things overall are going pretty good I got over being sick, my relationship is going good, this week I am going to get outside and sit in my chair some its suppose to get warmer this week".The OPT therapist worked with the patient on implementing positive thinking and reviewing  the recent medication therapy appointment as well as upcoming care appointments. The OPT therapist gauged the patents mood through scale measuring within the session. The OPT therapist continued to provide support/encouragement and worked with the patient on staying motivated and using coping skills. The OPT therapist will continue treatment work with the patient in her next scheduled session. ?  ?Plan: Return again in 3 weeks. ?  ?Diagnosis:      Axis I: MDD (major depressive disorder), recurrent, in partial remission  ?  ?  ?                        Axis II: No diagnosis ?  ?  ?Collaboration of Care: Collaboration of Care in review of patient involvement with psychiatrist Dr. Modesta Messing and upcoming appointment 10/26/2021 ?  ?Patient/Guardian was advised Release of Information must be obtained prior to any record  release in order to collaborate their care with an outside provider. Patient/Guardian was advised if they have not already done so to contact the registration department to sign all necessary forms in order for [place] to release information regarding their care.  ?  ?Consent: Patient/Guardian gives verbal consent for treatment and assignment of benefits for  services provided during this visit. Patient/Guardian expressed understanding and agreed to proceed. ?  ?  ?I discussed the assessment and treatment plan with the patient. The patient was provided an opportunity to ask questions and all were answered. The patient agreed with the plan and demonstrated an understanding of the instructions. ?  ?The patient was advised to call back or seek an in-person evaluation if the symptoms worsen or if the condition fails to improve as anticipated. ?  ?I provided 25 minutes of non-face-to-face time during this encounter. ?  ?Lennox Grumbles, LCSW ?  ?10/16/2021 ?

## 2021-10-17 DIAGNOSIS — Z992 Dependence on renal dialysis: Secondary | ICD-10-CM | POA: Diagnosis not present

## 2021-10-17 DIAGNOSIS — Z7689 Persons encountering health services in other specified circumstances: Secondary | ICD-10-CM | POA: Diagnosis not present

## 2021-10-17 DIAGNOSIS — N186 End stage renal disease: Secondary | ICD-10-CM | POA: Diagnosis not present

## 2021-10-19 DIAGNOSIS — N186 End stage renal disease: Secondary | ICD-10-CM | POA: Diagnosis not present

## 2021-10-19 DIAGNOSIS — Z992 Dependence on renal dialysis: Secondary | ICD-10-CM | POA: Diagnosis not present

## 2021-10-19 DIAGNOSIS — Z7689 Persons encountering health services in other specified circumstances: Secondary | ICD-10-CM | POA: Diagnosis not present

## 2021-10-21 DIAGNOSIS — Z992 Dependence on renal dialysis: Secondary | ICD-10-CM | POA: Diagnosis not present

## 2021-10-21 DIAGNOSIS — N186 End stage renal disease: Secondary | ICD-10-CM | POA: Diagnosis not present

## 2021-10-21 DIAGNOSIS — Z7689 Persons encountering health services in other specified circumstances: Secondary | ICD-10-CM | POA: Diagnosis not present

## 2021-10-24 DIAGNOSIS — Z794 Long term (current) use of insulin: Secondary | ICD-10-CM | POA: Diagnosis not present

## 2021-10-24 DIAGNOSIS — Z992 Dependence on renal dialysis: Secondary | ICD-10-CM | POA: Diagnosis not present

## 2021-10-24 DIAGNOSIS — J439 Emphysema, unspecified: Secondary | ICD-10-CM | POA: Diagnosis not present

## 2021-10-24 DIAGNOSIS — I259 Chronic ischemic heart disease, unspecified: Secondary | ICD-10-CM | POA: Diagnosis not present

## 2021-10-24 DIAGNOSIS — Z7689 Persons encountering health services in other specified circumstances: Secondary | ICD-10-CM | POA: Diagnosis not present

## 2021-10-24 DIAGNOSIS — N186 End stage renal disease: Secondary | ICD-10-CM | POA: Diagnosis not present

## 2021-10-24 DIAGNOSIS — E119 Type 2 diabetes mellitus without complications: Secondary | ICD-10-CM | POA: Diagnosis not present

## 2021-10-25 NOTE — Progress Notes (Signed)
Virtual Visit via Telephone Note ? ?I connected with Kara Knapp on 10/26/21 at  1:40 PM EDT by telephone and verified that I am speaking with the correct person using two identifiers. ? ?Location: ?Patient: dialysis ?Provider: office ?Persons participated in the visit- patient, provider  ?  ?I discussed the limitations, risks, security and privacy concerns of performing an evaluation and management service by telephone and the availability of in person appointments. I also discussed with the patient that there may be a patient responsible charge related to this service. The patient expressed understanding and agreed to proceed. ? ?  ?I discussed the assessment and treatment plan with the patient. The patient was provided an opportunity to ask questions and all were answered. The patient agreed with the plan and demonstrated an understanding of the instructions. ?  ?The patient was advised to call back or seek an in-person evaluation if the symptoms worsen or if the condition fails to improve as anticipated. ? ?I provided 11 minutes of non-face-to-face time during this encounter. ? ? ?[name], MD ? ? ? ?BH MD/PA/NP OP Progress Note ? ?10/26/2021 2:03 PM ?Kara Knapp  ?MRN:  [id number] ? ?Chief Complaint:  ?Chief Complaint  ?Patient presents with  ? Follow-up  ? Depression  ? ?HPI:  ?This is a follow-up appointment for depression and insomnia.  ?She states that she is at dialysis Center.  She agrees to proceed with the interview.  She also agrees to contact the office to reschedule if there is overlap in the schedule next time (this is the second time having visit while doing dialysis).  ?She states that she has been doing good.  Although she still thinks about loss, it has been getting better.  She enjoys visiting her daughter-in-law, who lives in the neighborhood.  Although her aide is out this week due to her attending funeral, she usually goes out with the aid.  She feels anxious, although she cannot  explain it.  She thinks her depression is better.  She continues to struggle with middle insomnia, and would like to have some medication for it.  She denies change in appetite.  She denies SI.  She has occasional panic attacks.  She denies alcohol use or drug use.  ? ? ?Daily routine: watches TV, walk around outside (use a walker), goes to dialysis. She has aid Mon- Saturdays, visits her daughter in law ?Work: On disability. Unemployed. used to work at [employer]  ?Household:  By herself, her son lives near by (his girlfriend used to be her aid) ?Number of children: 5 ? ?Visit Diagnosis:  ?  ICD-10-CM   ?1. MDD (major depressive disorder), recurrent, in partial remission ([place])  F33.41   ?  ?2. Insomnia, unspecified type  G47.00   ?  ? ? ?Past Psychiatric History: Please see initial evaluation for full details. I have reviewed the history. No updates at this time.  ?  ? ?Past Medical History:  ?Past Medical History:  ?Diagnosis Date  ? Anemia of chronic disease   ? Anxiety   ? Asthma   ? Blind left eye   ? Bronchitis   ? Cataract   ? Cholecystitis, acute 05/26/2013  ? Status post cholecystectomy  ? Chronic abdominal pain   ? Chronic diarrhea   ? COPD (chronic obstructive pulmonary disease) ([place])   ? Depression   ? Diabetic foot ulcer ([place]) 03/01/2015  ? Diastolic heart failure ([place])   ? ESRD on hemodialysis ([place])   ?  Started diaylsis 12/29/15  ? Essential hypertension   ? Fatty liver   ? Fibroids   ? GERD (gastroesophageal reflux disease)   ? Glaucoma   ? History of blood transfusion   ? History of pneumonia   ? Hyperlipidemia   ? Insulin-dependent diabetes mellitus with retinopathy   ? Liver fibrosis   ? Negative Hep B surface antigen, negative Hep C antibody Feb 2018 (see scanned in labs).  ? Neuropathy   ? Osteomyelitis ([place])   ? Toe on left foot  ?  ?Past Surgical History:  ?Procedure Laterality Date  ? A/V SHUNTOGRAM N/A 10/25/2016  ? Procedure: A/V Shuntogram - Right Arm;  Surgeon: Waynetta Sandy, MD;   Location: [facility];  Service: Cardiovascular;  Laterality: N/A;  ? A/V SHUNTOGRAM N/A 03/05/2018  ? Procedure: A/V SHUNTOGRAM - Right Arm;  Surgeon: Waynetta Sandy, MD;  Location: [facility];  Service: Cardiovascular;  Laterality: N/A;  ? A/V SHUNTOGRAM Left 03/15/2021  ? Procedure: A/V SHUNTOGRAM;  Surgeon: Cherre Robins, MD;  Location: [facility];  Service: Cardiovascular;  Laterality: Left;  ? A/V SHUNTOGRAM N/A 06/30/2021  ? Procedure: A/V SHUNTOGRAM;  Surgeon: Cherre Robins, MD;  Location: [facility];  Service: Cardiovascular;  Laterality: N/A;  ? AV FISTULA PLACEMENT Right 10/17/2015  ? Procedure: INSERTION OF ARTERIOVENOUS GORE-TEX GRAFT RIGHT UPPER ARM WITH ACUSEAL;  Surgeon: Conrad [place] Station, MD;  Location: [facility];  Service: Vascular;  Laterality: Right;  ? AV FISTULA PLACEMENT Left 11/18/2019  ? Procedure: INSERTION OF ARTERIOVENOUS (AV) GORE-TEX GRAFT left  ARM;  Surgeon: Serafina Mitchell, MD;  Location: [facility];  Service: Vascular;  Laterality: Left;  ? BIOPSY  01/02/2021  ? Procedure: BIOPSY;  Surgeon: Eloise Harman, DO;  [facility];  Service: Endoscopy;;  ? CATARACT EXTRACTION W/ INTRAOCULAR LENS IMPLANT Bilateral   ? CESAREAN SECTION    ? CHOLECYSTECTOMY N/A 05/25/2013  ? Procedure: LAPAROSCOPIC CHOLECYSTECTOMY;  Surgeon: Jamesetta So, MD;  [facility]: [facility];  Service: General;  Laterality: N/A;  ? COLONOSCOPY WITH PROPOFOL N/A 10/02/2016  ? two 3 to 5 mm polyps in the descending colon, three 2 to 3 mm polyps in the rectum, random colon biopsies, rectal bleeding due to internal hemorrhoids, friability with no bleeding at the anus status post biopsy.  Surgical pathology found the polyps to be a mix of hyperplastic and tubular adenoma, random colon biopsies to be benign colonic mucosa, and the anal biopsies to be anal skin tag.   ? COLONOSCOPY WITH PROPOFOL N/A 03/10/2021  ? Procedure: COLONOSCOPY WITH PROPOFOL;  Surgeon: Eloise Harman, DO;   [facility];  Service: Endoscopy;  Laterality: N/A;  11:30am, pt is on dialysis - pt can not come earlier, riding [facility]  ? ESOPHAGOGASTRODUODENOSCOPY (EGD) WITH PROPOFOL N/A 10/02/2016  ? mucosal nodule in the esophagus status post biopsy, moderate gastritis status post biopsy, mild duodenitis. esophageal biopsy to be benign, gastric biopsies to be gastritis due to aspirin use, and duodenal biopsies to be duodenitis due to aspirin use  ? ESOPHAGOGASTRODUODENOSCOPY (EGD) WITH PROPOFOL N/A 01/02/2021  ? Procedure: ESOPHAGOGASTRODUODENOSCOPY (EGD) WITH PROPOFOL;  Surgeon: Eloise Harman, DO;  [facility];  Service: Endoscopy;  Laterality: N/A;  ? EXCISION OF MESH Left 03/27/2021  ? Procedure: EXCISION OF  ARTERIOVENOUS GRAFT PSUEDOANUERYSM;  Surgeon: Cherre Robins, MD;  Location: [facility];  Service: Vascular;  Laterality: Left;  ? EYE  SURGERY Bilateral   ? INSERTION OF DIALYSIS CATHETER  03/27/2021  ? Procedure: ULTRASOUND GUIDED INSERTION OF TUNNELED DIALYSIS CATHETER;  Surgeon: Cherre Robins, MD;  Location: [facility];  Service: Vascular;;  ? IR FLUORO GUIDE CV LINE RIGHT  10/09/2019  ? IR REMOVAL TUN CV CATH W/O FL  01/15/2020  ? IR THROMBECTOMY AV FISTULA W/THROMBOLYSIS/PTA INC/SHUNT/IMG RIGHT Right 12/26/2018  ? IR THROMBECTOMY AV FISTULA W/THROMBOLYSIS/PTA INC/SHUNT/IMG RIGHT Right 08/10/2019  ? IR THROMBECTOMY AV FISTULA W/THROMBOLYSIS/PTA/STENT INC/SHUNT/IMG RT Right 08/08/2018  ? IR THROMBECTOMY AV FISTULA W/THROMBOLYSIS/PTA/STENT INC/SHUNT/IMG RT Right 05/15/2019  ? IR US GUIDE VASC ACCESS RIGHT  08/08/2018  ? IR US GUIDE VASC ACCESS RIGHT  12/26/2018  ? IR US GUIDE VASC ACCESS RIGHT  05/15/2019  ? IR US GUIDE VASC ACCESS RIGHT  08/10/2019  ? IR US GUIDE VASC ACCESS RIGHT  10/09/2019  ? PARS PLANA VITRECTOMY Left 11/24/2014  ? Procedure: PARS PLANA VITRECTOMY WITH 25 GAUGE;  Surgeon: Hurman Horn, MD;  Location: [facility];  Service: Ophthalmology;  Laterality: Left;  ? PERIPHERAL VASCULAR BALLOON  ANGIOPLASTY Right 03/05/2018  ? Procedure: PERIPHERAL VASCULAR BALLOON ANGIOPLASTY;  Surgeon: Waynetta Sandy, MD;  Location: [facility];  Service: Cardiovascular;  Laterality: Right;  arm fistul

## 2021-10-26 ENCOUNTER — Encounter: Payer: Self-pay | Admitting: Psychiatry

## 2021-10-26 ENCOUNTER — Telehealth (INDEPENDENT_AMBULATORY_CARE_PROVIDER_SITE_OTHER): Payer: Medicare Other | Admitting: Psychiatry

## 2021-10-26 DIAGNOSIS — G4700 Insomnia, unspecified: Secondary | ICD-10-CM | POA: Diagnosis not present

## 2021-10-26 DIAGNOSIS — Z7689 Persons encountering health services in other specified circumstances: Secondary | ICD-10-CM | POA: Diagnosis not present

## 2021-10-26 DIAGNOSIS — F3341 Major depressive disorder, recurrent, in partial remission: Secondary | ICD-10-CM | POA: Diagnosis not present

## 2021-10-26 DIAGNOSIS — N186 End stage renal disease: Secondary | ICD-10-CM | POA: Diagnosis not present

## 2021-10-26 DIAGNOSIS — Z992 Dependence on renal dialysis: Secondary | ICD-10-CM | POA: Diagnosis not present

## 2021-10-26 MED ORDER — ESZOPICLONE 1 MG PO TABS: 1.0000 mg | ORAL_TABLET | Freq: Every evening | ORAL | 2 refills | Status: AC | PRN

## 2021-10-26 NOTE — Patient Instructions (Signed)
Continue sertraline 150 mg daily ?Continue Abilify 10 mg at night  ?Continue mirtazapine 7.5 mg at night  ?Start Lunesta 1 mg at night as needed for insomnia ?Next appointment: 6/19 at 2:30 ? ?The next visit will be in person visit. Please arrive 15 mins before the scheduled time.  ? ?[facility]  ?Address: [address], [place], [place] [address]   ?

## 2021-10-27 DIAGNOSIS — N186 End stage renal disease: Secondary | ICD-10-CM | POA: Diagnosis not present

## 2021-10-27 DIAGNOSIS — Z992 Dependence on renal dialysis: Secondary | ICD-10-CM | POA: Diagnosis not present

## 2021-10-28 DIAGNOSIS — N186 End stage renal disease: Secondary | ICD-10-CM | POA: Diagnosis not present

## 2021-10-28 DIAGNOSIS — Z992 Dependence on renal dialysis: Secondary | ICD-10-CM | POA: Diagnosis not present

## 2021-10-28 DIAGNOSIS — Z7689 Persons encountering health services in other specified circumstances: Secondary | ICD-10-CM | POA: Diagnosis not present

## 2021-10-30 DIAGNOSIS — N186 End stage renal disease: Secondary | ICD-10-CM | POA: Diagnosis not present

## 2021-10-30 DIAGNOSIS — E1165 Type 2 diabetes mellitus with hyperglycemia: Secondary | ICD-10-CM | POA: Diagnosis not present

## 2021-10-30 DIAGNOSIS — Z299 Encounter for prophylactic measures, unspecified: Secondary | ICD-10-CM | POA: Diagnosis not present

## 2021-10-30 DIAGNOSIS — Z992 Dependence on renal dialysis: Secondary | ICD-10-CM | POA: Diagnosis not present

## 2021-10-30 DIAGNOSIS — I10 Essential (primary) hypertension: Secondary | ICD-10-CM | POA: Diagnosis not present

## 2021-11-01 DIAGNOSIS — Z992 Dependence on renal dialysis: Secondary | ICD-10-CM | POA: Diagnosis not present

## 2021-11-01 DIAGNOSIS — N186 End stage renal disease: Secondary | ICD-10-CM | POA: Diagnosis not present

## 2021-11-02 DIAGNOSIS — I10 Essential (primary) hypertension: Secondary | ICD-10-CM | POA: Diagnosis not present

## 2021-11-02 DIAGNOSIS — Z7689 Persons encountering health services in other specified circumstances: Secondary | ICD-10-CM | POA: Diagnosis not present

## 2021-11-02 DIAGNOSIS — J340 Abscess, furuncle and carbuncle of nose: Secondary | ICD-10-CM | POA: Diagnosis not present

## 2021-11-02 DIAGNOSIS — H548 Legal blindness, as defined in USA: Secondary | ICD-10-CM | POA: Diagnosis not present

## 2021-11-02 DIAGNOSIS — Z299 Encounter for prophylactic measures, unspecified: Secondary | ICD-10-CM | POA: Diagnosis not present

## 2021-11-02 DIAGNOSIS — Z992 Dependence on renal dialysis: Secondary | ICD-10-CM | POA: Diagnosis not present

## 2021-11-02 DIAGNOSIS — N186 End stage renal disease: Secondary | ICD-10-CM | POA: Diagnosis not present

## 2021-11-02 DIAGNOSIS — J439 Emphysema, unspecified: Secondary | ICD-10-CM | POA: Diagnosis not present

## 2021-11-02 DIAGNOSIS — Z789 Other specified health status: Secondary | ICD-10-CM | POA: Diagnosis not present

## 2021-11-04 DIAGNOSIS — N186 End stage renal disease: Secondary | ICD-10-CM | POA: Diagnosis not present

## 2021-11-04 DIAGNOSIS — Z992 Dependence on renal dialysis: Secondary | ICD-10-CM | POA: Diagnosis not present

## 2021-11-04 DIAGNOSIS — Z7689 Persons encountering health services in other specified circumstances: Secondary | ICD-10-CM | POA: Diagnosis not present

## 2021-11-06 ENCOUNTER — Ambulatory Visit (INDEPENDENT_AMBULATORY_CARE_PROVIDER_SITE_OTHER): Payer: Medicare Other | Admitting: Clinical

## 2021-11-06 DIAGNOSIS — F3341 Major depressive disorder, recurrent, in partial remission: Secondary | ICD-10-CM | POA: Diagnosis not present

## 2021-11-06 NOTE — Progress Notes (Signed)
Virtual Visit via Telephone Note ?  ?I connected with Kara Knapp on 11/06/2021 at 1:00 PM EDT by telephone and verified that I am speaking with the correct person using two identifiers. ?  ?Location: ?Patient: Home ?Provider: Office  ?  ?I discussed the limitations of evaluation and management by telemedicine and the availability of in person appointments. The patient expressed understanding and agreed to proceed. ?  ?  ?  ?THERAPIST PROGRESS NOTE ?  ?Session Time: 1:00 PM-1:30 PM ?  ?Participation Level: Active ?  ?Behavioral Response: CasualAlertDepressed ?  ?Type of Therapy: Individual Therapy ?  ?Treatment Goals addressed: Coping ?  ?Interventions: CBT ?  ?Summary: Kara Knapp is a 51 y.o. female who presents with Depression. The OPT therapist worked with the patient for her ongoing OPT treatment session. The OPT therapist utilized Motivational Interviewing to assist in creating therapeutic repore. The patient in the session was engaged and work in collaboration giving feedback about her triggers and symptoms over the course of the past few weeks. The patient spoke about recent breakup with her boyfriend due to in part the patients boyfriend being upset he is not able to stay /live with the patient due to rules of her housing property and the patients boyfriend not being on the lease and this creating a conflict between the patient and her boyfriend which led to a argument and break up. The OPT therapist utilized Cognitive Behavioral Therapy through cognitive restructuring as well as worked with the patient on coping strategies to assist in management of mood and as she continues to work on her interactions with others. The patient reported the she continues to work on implementing positive thinking, self esteem, and staying active as well as being aware of and consistent in keeping her health care appointments.  ?  ?Suicidal/Homicidal: Nowithout intent/plan ?  ?Therapist Response: The OPT therapist  worked with the patient for the patients scheduled session. The patient was engaged in his session and gave feedback in relation to triggers, symptoms, and behavior responses over the past few weeks. The OPT therapist worked with the patient utilizing an in session Cognitive Behavioral Therapy exercise. The patient was responsive in the session and verbalized, " I got a notice from my landlord stating my boyfriend could not stay another night cause he is not on the lease and this led to his fighting with me and calling me a bitch so I called the office and he is band from being being on the property".The OPT therapist worked with the patient on implementing positive thinking and reviewing  the recent medication therapy appointment as well as upcoming care appointments. The OPT therapist gauged the patents mood through scale measuring within the session.  The patient verbalized that she is ok even though she went through a break up. The OPT therapist continued to provide support/encouragement and worked with the patient on staying motivated and using coping skills. The OPT therapist will continue treatment work with the patient in her next scheduled session. ?  ?Plan: Return again in 3 weeks. ?  ?Diagnosis:      Axis I: MDD (major depressive disorder), recurrent, in partial remission  ?  ?  ?                        Axis II: No diagnosis ?  ?  ?Collaboration of Care: Collaboration of Care in review of patient involvement with psychiatrist Dr. Modesta Messing and upcoming appointment 10/26/2021 ?  ?  Patient/Guardian was advised Release of Information must be obtained prior to any record release in order to collaborate their care with an outside provider. Patient/Guardian was advised if they have not already done so to contact the registration department to sign all necessary forms in order for [place] to release information regarding their care.  ?  ?Consent: Patient/Guardian gives verbal consent for treatment and assignment of  benefits for services provided during this visit. Patient/Guardian expressed understanding and agreed to proceed. ?  ?  ?I discussed the assessment and treatment plan with the patient. The patient was provided an opportunity to ask questions and all were answered. The patient agreed with the plan and demonstrated an understanding of the instructions. ?  ?The patient was advised to call back or seek an in-person evaluation if the symptoms worsen or if the condition fails to improve as anticipated. ?  ?I provided 30 minutes of non-face-to-face time during this encounter. ?  ?Lennox Grumbles, LCSW ?  ?11/06/2021 ?

## 2021-11-07 DIAGNOSIS — Z7689 Persons encountering health services in other specified circumstances: Secondary | ICD-10-CM | POA: Diagnosis not present

## 2021-11-07 DIAGNOSIS — N186 End stage renal disease: Secondary | ICD-10-CM | POA: Diagnosis not present

## 2021-11-07 DIAGNOSIS — Z992 Dependence on renal dialysis: Secondary | ICD-10-CM | POA: Diagnosis not present

## 2021-11-09 DIAGNOSIS — N186 End stage renal disease: Secondary | ICD-10-CM | POA: Diagnosis not present

## 2021-11-09 DIAGNOSIS — Z7689 Persons encountering health services in other specified circumstances: Secondary | ICD-10-CM | POA: Diagnosis not present

## 2021-11-09 DIAGNOSIS — Z992 Dependence on renal dialysis: Secondary | ICD-10-CM | POA: Diagnosis not present

## 2021-11-11 DIAGNOSIS — Z7689 Persons encountering health services in other specified circumstances: Secondary | ICD-10-CM | POA: Diagnosis not present

## 2021-11-11 DIAGNOSIS — N186 End stage renal disease: Secondary | ICD-10-CM | POA: Diagnosis not present

## 2021-11-11 DIAGNOSIS — Z992 Dependence on renal dialysis: Secondary | ICD-10-CM | POA: Diagnosis not present

## 2021-11-14 DIAGNOSIS — Z992 Dependence on renal dialysis: Secondary | ICD-10-CM | POA: Diagnosis not present

## 2021-11-14 DIAGNOSIS — N186 End stage renal disease: Secondary | ICD-10-CM | POA: Diagnosis not present

## 2021-11-14 DIAGNOSIS — Z7689 Persons encountering health services in other specified circumstances: Secondary | ICD-10-CM | POA: Diagnosis not present

## 2021-11-15 ENCOUNTER — Ambulatory Visit (INDEPENDENT_AMBULATORY_CARE_PROVIDER_SITE_OTHER): Payer: Medicare Other | Admitting: Gastroenterology

## 2021-11-15 ENCOUNTER — Other Ambulatory Visit: Payer: Self-pay

## 2021-11-15 ENCOUNTER — Telehealth: Payer: Self-pay

## 2021-11-15 ENCOUNTER — Encounter: Payer: Self-pay | Admitting: Gastroenterology

## 2021-11-15 DIAGNOSIS — D509 Iron deficiency anemia, unspecified: Secondary | ICD-10-CM

## 2021-11-15 NOTE — Progress Notes (Signed)
? ? ? ? ? ?Gastroenterology Office Note   ? ? ?Primary Care Physician:  Medicine, [facility]  ?Primary Gastroenterologist: Dr. Abbey Chatters  ? ? ?Chief Complaint  ? ?Chief Complaint  ?Patient presents with  ? Follow-up  ? ? ? ?History of Present Illness  ? ?Kara Knapp is a 51 y.o. female presenting today in follow-up with a history of ESRD on hemodialysis, diabetes, hypertension, blindness, history of cocaine use, COPD, questionable early cirrhosis with known fibrosis, iron overload due to frequent iron infusions, history of Hep C positive antibody but negative RNA (cleared on own). Inpatient last year with acute on chronic anemia and undergoing EGD with non-severe candida esophagitis, gastritis, normal duodenum. +KOH prep. Colonoscopy as outpatient Aug 2022 with tubular adenoma.  ? ?Nephrology reached out in January regarding worsening IDA.  Patient's ferritin 18, iron sats 4%, and Hgb 8.3 at that time. No recent labs to review today. She has history of iron overload due to prior iron infusions. This was discussed with Hematology; recommending closely follow and provide infusions as needed for IDA but judiciously.  ? ?Notes problems with dysphagia. Gets choked, regurgitates. Difficulty with tougher textures. Harder with bread. Edentulous. No odynophagia. No abdominal pain. No obvious overt GI bleeding.  ? ? ? ? ?Past Medical History:  ?Diagnosis Date  ? Anemia of chronic disease   ? Anxiety   ? Asthma   ? Blind left eye   ? Bronchitis   ? Cataract   ? Cholecystitis, acute 05/26/2013  ? Status post cholecystectomy  ? Chronic abdominal pain   ? Chronic diarrhea   ? COPD (chronic obstructive pulmonary disease) ([place])   ? Depression   ? Diabetic foot ulcer ([place]) 03/01/2015  ? Diastolic heart failure ([place])   ? ESRD on hemodialysis ([place])   ? Started diaylsis 12/29/15  ? Essential hypertension   ? Fatty liver   ? Fibroids   ? GERD (gastroesophageal reflux disease)   ? Glaucoma   ? History of blood transfusion   ? History  of pneumonia   ? Hyperlipidemia   ? Insulin-dependent diabetes mellitus with retinopathy   ? Liver fibrosis   ? Negative Hep B surface antigen, negative Hep C antibody Feb 2018 (see scanned in labs).  ? Neuropathy   ? Osteomyelitis ([place])   ? Toe on left foot  ? ? ?Past Surgical History:  ?Procedure Laterality Date  ? A/V SHUNTOGRAM N/A 10/25/2016  ? Procedure: A/V Shuntogram - Right Arm;  Surgeon: Waynetta Sandy, MD;  Location: [facility];  Service: Cardiovascular;  Laterality: N/A;  ? A/V SHUNTOGRAM N/A 03/05/2018  ? Procedure: A/V SHUNTOGRAM - Right Arm;  Surgeon: Waynetta Sandy, MD;  Location: [facility];  Service: Cardiovascular;  Laterality: N/A;  ? A/V SHUNTOGRAM Left 03/15/2021  ? Procedure: A/V SHUNTOGRAM;  Surgeon: Cherre Robins, MD;  Location: [facility];  Service: Cardiovascular;  Laterality: Left;  ? A/V SHUNTOGRAM N/A 06/30/2021  ? Procedure: A/V SHUNTOGRAM;  Surgeon: Cherre Robins, MD;  Location: [facility];  Service: Cardiovascular;  Laterality: N/A;  ? AV FISTULA PLACEMENT Right 10/17/2015  ? Procedure: INSERTION OF ARTERIOVENOUS GORE-TEX GRAFT RIGHT UPPER ARM WITH ACUSEAL;  Surgeon: Conrad Lupus, MD;  Location: [facility];  Service: Vascular;  Laterality: Right;  ? AV FISTULA PLACEMENT Left 11/18/2019  ? Procedure: INSERTION OF ARTERIOVENOUS (AV) GORE-TEX GRAFT left  ARM;  Surgeon: Serafina Mitchell, MD;  Location: [facility];  Service: Vascular;  Laterality: Left;  ? BIOPSY  01/02/2021  ? Procedure: BIOPSY;  Surgeon: Eloise Harman, DO;  [facility];  Service: Endoscopy;;  ? CATARACT EXTRACTION W/ INTRAOCULAR LENS IMPLANT Bilateral   ? CESAREAN SECTION    ? CHOLECYSTECTOMY N/A 05/25/2013  ? Procedure: LAPAROSCOPIC CHOLECYSTECTOMY;  Surgeon: Jamesetta So, MD;  Location: [facility];  Service: General;  Laterality: N/A;  ? COLONOSCOPY WITH PROPOFOL N/A 10/02/2016  ? two 3 to 5 mm polyps in the descending colon, three 2 to 3 mm polyps in the  rectum, random colon biopsies, rectal bleeding due to internal hemorrhoids, friability with no bleeding at the anus status post biopsy.  Surgical pathology found the polyps to be a mix of hyperplastic and tubular adenoma, random colon biopsies to be benign colonic mucosa, and the anal biopsies to be anal skin tag.   ? COLONOSCOPY WITH PROPOFOL N/A 03/10/2021  ? Dr. Abbey Chatters: non-bleeding internal hemorrhoids, one tubular adenoma  ? ESOPHAGOGASTRODUODENOSCOPY (EGD) WITH PROPOFOL N/A 10/02/2016  ? mucosal nodule in the esophagus status post biopsy, moderate gastritis status post biopsy, mild duodenitis. esophageal biopsy to be benign, gastric biopsies to be gastritis due to aspirin use, and duodenal biopsies to be duodenitis due to aspirin use  ? ESOPHAGOGASTRODUODENOSCOPY (EGD) WITH PROPOFOL N/A 01/02/2021  ? Dr. Abbey Chatters: non-severe candida esophagitis, gastritis, normal duodenum, reactive gastropathy on path. +KOH prep  ? EXCISION OF MESH Left 03/27/2021  ? Procedure: EXCISION OF  ARTERIOVENOUS GRAFT PSUEDOANUERYSM;  Surgeon: Cherre Robins, MD;  Location: [facility];  Service: Vascular;  Laterality: Left;  ? EYE SURGERY Bilateral   ? INSERTION OF DIALYSIS CATHETER  03/27/2021  ? Procedure: ULTRASOUND GUIDED INSERTION OF TUNNELED DIALYSIS CATHETER;  Surgeon: Cherre Robins, MD;  Location: [facility];  Service: Vascular;;  ? IR FLUORO GUIDE CV LINE RIGHT  10/09/2019  ? IR REMOVAL TUN CV CATH W/O FL  01/15/2020  ? IR THROMBECTOMY AV FISTULA W/THROMBOLYSIS/PTA INC/SHUNT/IMG RIGHT Right 12/26/2018  ? IR THROMBECTOMY AV FISTULA W/THROMBOLYSIS/PTA INC/SHUNT/IMG RIGHT Right 08/10/2019  ? IR THROMBECTOMY AV FISTULA W/THROMBOLYSIS/PTA/STENT INC/SHUNT/IMG RT Right 08/08/2018  ? IR THROMBECTOMY AV FISTULA W/THROMBOLYSIS/PTA/STENT INC/SHUNT/IMG RT Right 05/15/2019  ? IR US GUIDE VASC ACCESS RIGHT  08/08/2018  ? IR US GUIDE VASC ACCESS RIGHT  12/26/2018  ? IR US GUIDE VASC ACCESS RIGHT  05/15/2019  ? IR US GUIDE VASC ACCESS RIGHT   08/10/2019  ? IR US GUIDE VASC ACCESS RIGHT  10/09/2019  ? PARS PLANA VITRECTOMY Left 11/24/2014  ? Procedure: PARS PLANA VITRECTOMY WITH 25 GAUGE;  Surgeon: Hurman Horn, MD;  Location: [facility];  Service: Ophthalmology;  Laterality: Left;  ? PERIPHERAL VASCULAR BALLOON ANGIOPLASTY Right 03/05/2018  ? Procedure: PERIPHERAL VASCULAR BALLOON ANGIOPLASTY;  Surgeon: Waynetta Sandy, MD;  Location: [facility];  Service: Cardiovascular;  Laterality: Right;  arm fistula  ? PERIPHERAL VASCULAR BALLOON ANGIOPLASTY  06/30/2021  ? Procedure: PERIPHERAL VASCULAR BALLOON ANGIOPLASTY;  Surgeon: Cherre Robins, MD;  Location: [facility];  Service: Cardiovascular;;  ? PERIPHERAL VASCULAR CATHETERIZATION N/A 04/28/2015  ? Procedure: Bilateral Upper Extremity Venography;  Surgeon: Conrad Bartlett, MD;  Location: [facility];  Service: Cardiovascular;  Laterality: N/A;  ? PHOTOCOAGULATION WITH LASER Left 11/24/2014  ? Procedure: PHOTOCOAGULATION WITH LASER;  Surgeon: Hurman Horn, MD;  Location: [facility];  Service: Ophthalmology;  Laterality: Left;  with insertion of silicone oil  ? POLYPECTOMY  03/10/2021  ? Procedure: POLYPECTOMY  INTESTINAL;  Surgeon: Eloise Harman, DO;  [facility];  Service: Endoscopy;;  ? REVISION OF ARTERIOVENOUS GORETEX GRAFT Left 03/27/2021  ? Procedure: LEFT ARM ARTERIOVENOUS GORETEX GRAFT PLACEMENT;  Surgeon: Cherre Robins, MD;  Location: [facility];  Service: Vascular;  Laterality: Left;  PERIPHERAL NERVE BLOCK  ? SAVORY DILATION N/A 10/02/2016  ? Procedure: SAVORY DILATION;  Surgeon: Danie Binder, MD;  [facility];  Service: Endoscopy;  Laterality: N/A;  ? TUBAL LIGATION    ? UPPER EXTREMITY VENOGRAPHY Bilateral 11/02/2019  ? Procedure: UPPER EXTREMITY VENOGRAPHY;  Surgeon: Waynetta Sandy, MD;  Location: [facility];  Service: Cardiovascular;  Laterality: Bilateral;  ? ? ?Current Outpatient Medications  ?Medication Sig Dispense  Refill  ? acetaminophen (TYLENOL) 500 MG tablet Take 1,000 mg by mouth every 6 (six) hours as needed for moderate pain or headache.    ? ARIPiprazole (ABILIFY) 5 MG tablet Take 1 tablet (5 mg total) by mouth d

## 2021-11-15 NOTE — Telephone Encounter (Signed)
PA for EGD/DIL/[place] capsule study submitted via [facility] website. PA# [id number], valid 12/18/21-03/18/22. ?

## 2021-11-15 NOTE — Patient Instructions (Signed)
We are arranging an upper endoscopy with dilation and capsule deployment. The capsule study is so we can see if anything in your small intestine is causing blood loss.  ? ?Please do not take Tradjenta the day of the procedure. Only take 1/2 dose of Lantus the night before the procedure. ? ?We will see you in 3 months! ? ?I enjoyed seeing you again today! As you know, I value our relationship and want to provide genuine, compassionate, and quality care. I welcome your feedback. If you receive a survey regarding your visit,  I greatly appreciate you taking time to fill this out. See you next time! ? ?Annitta Needs, PhD, ANP-BC ?[facility]  ? ?

## 2021-11-16 DIAGNOSIS — Z992 Dependence on renal dialysis: Secondary | ICD-10-CM | POA: Diagnosis not present

## 2021-11-16 DIAGNOSIS — Z7689 Persons encountering health services in other specified circumstances: Secondary | ICD-10-CM | POA: Diagnosis not present

## 2021-11-16 DIAGNOSIS — N186 End stage renal disease: Secondary | ICD-10-CM | POA: Diagnosis not present

## 2021-11-18 DIAGNOSIS — N186 End stage renal disease: Secondary | ICD-10-CM | POA: Diagnosis not present

## 2021-11-18 DIAGNOSIS — Z7689 Persons encountering health services in other specified circumstances: Secondary | ICD-10-CM | POA: Diagnosis not present

## 2021-11-18 DIAGNOSIS — Z992 Dependence on renal dialysis: Secondary | ICD-10-CM | POA: Diagnosis not present

## 2021-11-20 ENCOUNTER — Encounter (INDEPENDENT_AMBULATORY_CARE_PROVIDER_SITE_OTHER): Payer: Medicare Other | Admitting: Ophthalmology

## 2021-11-20 DIAGNOSIS — Z992 Dependence on renal dialysis: Secondary | ICD-10-CM | POA: Diagnosis not present

## 2021-11-20 DIAGNOSIS — N186 End stage renal disease: Secondary | ICD-10-CM | POA: Diagnosis not present

## 2021-11-21 ENCOUNTER — Telehealth: Payer: Self-pay

## 2021-11-21 DIAGNOSIS — N186 End stage renal disease: Secondary | ICD-10-CM | POA: Diagnosis not present

## 2021-11-21 DIAGNOSIS — R739 Hyperglycemia, unspecified: Secondary | ICD-10-CM | POA: Diagnosis not present

## 2021-11-21 DIAGNOSIS — I469 Cardiac arrest, cause unspecified: Secondary | ICD-10-CM | POA: Diagnosis not present

## 2021-11-21 DIAGNOSIS — R404 Transient alteration of awareness: Secondary | ICD-10-CM | POA: Diagnosis not present

## 2021-11-21 DIAGNOSIS — R0689 Other abnormalities of breathing: Secondary | ICD-10-CM | POA: Diagnosis not present

## 2021-11-21 DIAGNOSIS — I499 Cardiac arrhythmia, unspecified: Secondary | ICD-10-CM | POA: Diagnosis not present

## 2021-11-21 DIAGNOSIS — Z992 Dependence on renal dialysis: Secondary | ICD-10-CM | POA: Diagnosis not present

## 2021-11-22 NOTE — Telephone Encounter (Signed)
Patient passed away yesterday after coding in dialysis.  ? ?[facility] clinical pool: please cancel upcoming procedure. ? ?Stacey/Susan: FYI.  ? ?Dr. Abbey Chatters: Juluis Rainier.  ?

## 2021-11-22 NOTE — Telephone Encounter (Signed)
Kara Knapp, please send message to HIM to mark as deceased ?

## 2021-11-22 NOTE — Telephone Encounter (Signed)
Endo scheduler informed to cancel procedure. ?

## 2021-11-24 DIAGNOSIS — J439 Emphysema, unspecified: Secondary | ICD-10-CM | POA: Diagnosis not present

## 2021-11-27 DIAGNOSIS — 419620001 Death: Secondary | SNOMED CT | POA: Diagnosis not present

## 2021-11-27 NOTE — Telephone Encounter (Signed)
Kara Knapp, ? ?[name] phoned [phone]) advised that the pt's last EGD- pt had a Hx of iron overload but know her iron is 6%, ferritin 18 and Hgb 8.1 form a 8.8. wants to speak with you to see if it is ok for pt to get iron. Please advise ?

## 2021-11-27 DEATH — deceased

## 2021-12-11 ENCOUNTER — Ambulatory Visit (HOSPITAL_COMMUNITY): Payer: Medicare Other | Admitting: Clinical

## 2021-12-13 ENCOUNTER — Other Ambulatory Visit (HOSPITAL_COMMUNITY): Payer: Medicare Other

## 2021-12-18 ENCOUNTER — Ambulatory Visit (HOSPITAL_COMMUNITY): Admit: 2021-12-18 | Payer: Medicare Other

## 2021-12-18 ENCOUNTER — Encounter (HOSPITAL_COMMUNITY): Payer: Self-pay

## 2021-12-18 SURGERY — ESOPHAGOGASTRODUODENOSCOPY (EGD) WITH PROPOFOL
Anesthesia: Monitor Anesthesia Care

## 2022-01-15 ENCOUNTER — Ambulatory Visit: Payer: Medicare Other | Admitting: Psychiatry

## 2022-02-20 ENCOUNTER — Ambulatory Visit: Payer: Medicare Other | Admitting: Gastroenterology
